# Patient Record
Sex: Female | Born: 1935 | Race: Black or African American | Hispanic: No | Smoking: Never smoker
Health system: Southern US, Community
[De-identification: ages and names within clinical notes are randomized; demographics above are authoritative.]

## PROBLEM LIST (undated history)

## (undated) DIAGNOSIS — F439 Reaction to severe stress, unspecified: Secondary | ICD-10-CM

## (undated) DIAGNOSIS — K224 Dyskinesia of esophagus: Secondary | ICD-10-CM

## (undated) DIAGNOSIS — R569 Unspecified convulsions: Secondary | ICD-10-CM

## (undated) DIAGNOSIS — K219 Gastro-esophageal reflux disease without esophagitis: Secondary | ICD-10-CM

## (undated) DIAGNOSIS — E785 Hyperlipidemia, unspecified: Secondary | ICD-10-CM

## (undated) DIAGNOSIS — E119 Type 2 diabetes mellitus without complications: Secondary | ICD-10-CM

## (undated) DIAGNOSIS — Z860100 Personal history of colon polyps, unspecified: Secondary | ICD-10-CM

## (undated) DIAGNOSIS — I272 Pulmonary hypertension, unspecified: Secondary | ICD-10-CM

## (undated) DIAGNOSIS — F419 Anxiety disorder, unspecified: Secondary | ICD-10-CM

## (undated) DIAGNOSIS — M1990 Unspecified osteoarthritis, unspecified site: Secondary | ICD-10-CM

## (undated) DIAGNOSIS — D649 Anemia, unspecified: Secondary | ICD-10-CM

## (undated) DIAGNOSIS — I639 Cerebral infarction, unspecified: Secondary | ICD-10-CM

## (undated) DIAGNOSIS — T7840XA Allergy, unspecified, initial encounter: Secondary | ICD-10-CM

## (undated) DIAGNOSIS — L0292 Furuncle, unspecified: Secondary | ICD-10-CM

## (undated) DIAGNOSIS — D3500 Benign neoplasm of unspecified adrenal gland: Secondary | ICD-10-CM

## (undated) DIAGNOSIS — M545 Low back pain, unspecified: Secondary | ICD-10-CM

## (undated) DIAGNOSIS — I10 Essential (primary) hypertension: Secondary | ICD-10-CM

## (undated) DIAGNOSIS — N1330 Unspecified hydronephrosis: Secondary | ICD-10-CM

## (undated) DIAGNOSIS — K5792 Diverticulitis of intestine, part unspecified, without perforation or abscess without bleeding: Secondary | ICD-10-CM

## (undated) DIAGNOSIS — D689 Coagulation defect, unspecified: Secondary | ICD-10-CM

## (undated) DIAGNOSIS — F32A Depression, unspecified: Secondary | ICD-10-CM

## (undated) DIAGNOSIS — H409 Unspecified glaucoma: Secondary | ICD-10-CM

## (undated) HISTORY — DX: Reaction to severe stress, unspecified: F43.9

## (undated) HISTORY — DX: Low back pain: M54.5

## (undated) HISTORY — DX: Depression, unspecified: F32.A

## (undated) HISTORY — DX: Cerebral infarction, unspecified: I63.9

## (undated) HISTORY — PX: FOOT SURGERY: SHX648

## (undated) HISTORY — PX: BREAST BIOPSY: SHX20

## (undated) HISTORY — PX: JOINT REPLACEMENT: SHX530

## (undated) HISTORY — DX: Hyperlipidemia, unspecified: E78.5

## (undated) HISTORY — DX: Unspecified hydronephrosis: N13.30

## (undated) HISTORY — DX: Type 2 diabetes mellitus without complications: E11.9

## (undated) HISTORY — DX: Diverticulitis of intestine, part unspecified, without perforation or abscess without bleeding: K57.92

## (undated) HISTORY — PX: APPENDECTOMY: SHX54

## (undated) HISTORY — DX: Unspecified glaucoma: H40.9

## (undated) HISTORY — DX: Gastro-esophageal reflux disease without esophagitis: K21.9

## (undated) HISTORY — PX: CATARACT EXTRACTION, BILATERAL: SHX1313

## (undated) HISTORY — DX: Low back pain, unspecified: M54.50

## (undated) HISTORY — DX: Dyskinesia of esophagus: K22.4

## (undated) HISTORY — DX: Unspecified osteoarthritis, unspecified site: M19.90

## (undated) HISTORY — DX: Personal history of colonic polyps: Z86.010

## (undated) HISTORY — PX: FRACTURE SURGERY: SHX138

## (undated) HISTORY — DX: Pulmonary hypertension, unspecified: I27.20

## (undated) HISTORY — DX: Benign neoplasm of unspecified adrenal gland: D35.00

## (undated) HISTORY — DX: Essential (primary) hypertension: I10

## (undated) HISTORY — DX: Anxiety disorder, unspecified: F41.9

## (undated) HISTORY — PX: VARICOSE VEIN SURGERY: SHX832

## (undated) HISTORY — PX: ABDOMINAL HYSTERECTOMY: SHX81

## (undated) HISTORY — DX: Allergy, unspecified, initial encounter: T78.40XA

## (undated) HISTORY — DX: Anemia, unspecified: D64.9

## (undated) HISTORY — DX: Furuncle, unspecified: L02.92

## (undated) HISTORY — DX: Coagulation defect, unspecified: D68.9

## (undated) HISTORY — PX: HAMMER TOE SURGERY: SHX385

## (undated) HISTORY — PX: BACK SURGERY: SHX140

## (undated) HISTORY — DX: Personal history of colon polyps, unspecified: Z86.0100

## (undated) HISTORY — DX: Unspecified convulsions: R56.9

---

## 1998-07-06 ENCOUNTER — Ambulatory Visit (HOSPITAL_COMMUNITY): Admission: RE | Admit: 1998-07-06 | Discharge: 1998-07-06 | Payer: Self-pay | Admitting: Internal Medicine

## 1998-10-17 ENCOUNTER — Other Ambulatory Visit: Admission: RE | Admit: 1998-10-17 | Discharge: 1998-10-17 | Payer: Self-pay | Admitting: *Deleted

## 1999-03-30 ENCOUNTER — Ambulatory Visit (HOSPITAL_COMMUNITY): Admission: RE | Admit: 1999-03-30 | Discharge: 1999-03-30 | Payer: Self-pay | Admitting: *Deleted

## 1999-04-03 ENCOUNTER — Ambulatory Visit (HOSPITAL_COMMUNITY): Admission: RE | Admit: 1999-04-03 | Discharge: 1999-04-03 | Payer: Self-pay | Admitting: Ophthalmology

## 1999-07-10 ENCOUNTER — Ambulatory Visit (HOSPITAL_COMMUNITY): Admission: RE | Admit: 1999-07-10 | Discharge: 1999-07-10 | Payer: Self-pay | Admitting: Internal Medicine

## 1999-07-20 ENCOUNTER — Encounter: Payer: Self-pay | Admitting: Ophthalmology

## 1999-07-24 ENCOUNTER — Ambulatory Visit (HOSPITAL_COMMUNITY): Admission: RE | Admit: 1999-07-24 | Discharge: 1999-07-25 | Payer: Self-pay | Admitting: Ophthalmology

## 1999-09-14 ENCOUNTER — Ambulatory Visit (HOSPITAL_COMMUNITY): Admission: RE | Admit: 1999-09-14 | Discharge: 1999-09-14 | Payer: Self-pay | Admitting: Ophthalmology

## 1999-10-31 ENCOUNTER — Ambulatory Visit (HOSPITAL_COMMUNITY): Admission: RE | Admit: 1999-10-31 | Discharge: 1999-10-31 | Payer: Self-pay | Admitting: Ophthalmology

## 1999-11-29 ENCOUNTER — Other Ambulatory Visit: Admission: RE | Admit: 1999-11-29 | Discharge: 1999-11-29 | Payer: Self-pay | Admitting: Internal Medicine

## 2000-01-03 ENCOUNTER — Emergency Department (HOSPITAL_COMMUNITY): Admission: EM | Admit: 2000-01-03 | Discharge: 2000-01-03 | Payer: Self-pay | Admitting: Emergency Medicine

## 2000-07-10 ENCOUNTER — Ambulatory Visit (HOSPITAL_COMMUNITY): Admission: RE | Admit: 2000-07-10 | Discharge: 2000-07-10 | Payer: Self-pay | Admitting: Internal Medicine

## 2000-07-10 ENCOUNTER — Encounter: Payer: Self-pay | Admitting: Internal Medicine

## 2000-07-24 ENCOUNTER — Ambulatory Visit (HOSPITAL_COMMUNITY): Admission: RE | Admit: 2000-07-24 | Discharge: 2000-07-24 | Payer: Self-pay | Admitting: Orthopedic Surgery

## 2000-08-20 ENCOUNTER — Ambulatory Visit (HOSPITAL_COMMUNITY): Admission: RE | Admit: 2000-08-20 | Discharge: 2000-08-20 | Payer: Self-pay | Admitting: Orthopedic Surgery

## 2000-08-20 ENCOUNTER — Encounter: Payer: Self-pay | Admitting: Orthopedic Surgery

## 2000-09-28 ENCOUNTER — Encounter: Payer: Self-pay | Admitting: Emergency Medicine

## 2000-09-28 ENCOUNTER — Emergency Department (HOSPITAL_COMMUNITY): Admission: EM | Admit: 2000-09-28 | Discharge: 2000-09-28 | Payer: Self-pay | Admitting: Emergency Medicine

## 2000-12-13 ENCOUNTER — Other Ambulatory Visit: Admission: RE | Admit: 2000-12-13 | Discharge: 2000-12-13 | Payer: Self-pay | Admitting: Obstetrics and Gynecology

## 2001-01-22 ENCOUNTER — Encounter: Payer: Self-pay | Admitting: Orthopedic Surgery

## 2001-01-22 ENCOUNTER — Ambulatory Visit (HOSPITAL_COMMUNITY): Admission: RE | Admit: 2001-01-22 | Discharge: 2001-01-22 | Payer: Self-pay | Admitting: Orthopedic Surgery

## 2001-07-15 ENCOUNTER — Encounter: Payer: Self-pay | Admitting: Internal Medicine

## 2001-07-15 ENCOUNTER — Ambulatory Visit (HOSPITAL_COMMUNITY): Admission: RE | Admit: 2001-07-15 | Discharge: 2001-07-15 | Payer: Self-pay | Admitting: Internal Medicine

## 2001-08-28 ENCOUNTER — Ambulatory Visit (HOSPITAL_COMMUNITY): Admission: RE | Admit: 2001-08-28 | Discharge: 2001-08-28 | Payer: Self-pay | Admitting: Ophthalmology

## 2001-12-24 HISTORY — PX: TOTAL KNEE ARTHROPLASTY: SHX125

## 2002-04-01 ENCOUNTER — Encounter: Payer: Self-pay | Admitting: Orthopedic Surgery

## 2002-04-07 ENCOUNTER — Inpatient Hospital Stay (HOSPITAL_COMMUNITY): Admission: RE | Admit: 2002-04-07 | Discharge: 2002-04-10 | Payer: Self-pay | Admitting: Orthopedic Surgery

## 2002-04-10 ENCOUNTER — Inpatient Hospital Stay (HOSPITAL_COMMUNITY)
Admission: RE | Admit: 2002-04-10 | Discharge: 2002-04-15 | Payer: Self-pay | Admitting: Physical Medicine & Rehabilitation

## 2002-07-16 ENCOUNTER — Ambulatory Visit (HOSPITAL_COMMUNITY): Admission: RE | Admit: 2002-07-16 | Discharge: 2002-07-16 | Payer: Self-pay | Admitting: Internal Medicine

## 2002-07-16 ENCOUNTER — Encounter: Payer: Self-pay | Admitting: Internal Medicine

## 2002-12-29 ENCOUNTER — Encounter (INDEPENDENT_AMBULATORY_CARE_PROVIDER_SITE_OTHER): Payer: Self-pay | Admitting: Gastroenterology

## 2003-07-23 ENCOUNTER — Encounter: Payer: Self-pay | Admitting: Orthopedic Surgery

## 2003-07-23 ENCOUNTER — Ambulatory Visit (HOSPITAL_COMMUNITY): Admission: RE | Admit: 2003-07-23 | Discharge: 2003-07-23 | Payer: Self-pay | Admitting: Orthopedic Surgery

## 2003-07-29 ENCOUNTER — Encounter: Payer: Self-pay | Admitting: Internal Medicine

## 2003-07-29 ENCOUNTER — Ambulatory Visit (HOSPITAL_COMMUNITY): Admission: RE | Admit: 2003-07-29 | Discharge: 2003-07-29 | Payer: Self-pay | Admitting: Internal Medicine

## 2003-10-04 ENCOUNTER — Encounter: Payer: Self-pay | Admitting: Neurosurgery

## 2003-10-06 ENCOUNTER — Inpatient Hospital Stay (HOSPITAL_COMMUNITY): Admission: RE | Admit: 2003-10-06 | Discharge: 2003-10-15 | Payer: Self-pay | Admitting: Neurosurgery

## 2003-10-06 ENCOUNTER — Encounter: Payer: Self-pay | Admitting: Neurosurgery

## 2003-10-08 ENCOUNTER — Encounter: Payer: Self-pay | Admitting: Neurosurgery

## 2003-10-15 ENCOUNTER — Inpatient Hospital Stay (HOSPITAL_COMMUNITY)
Admission: RE | Admit: 2003-10-15 | Discharge: 2003-10-22 | Payer: Self-pay | Admitting: Physical Medicine & Rehabilitation

## 2003-10-15 ENCOUNTER — Encounter: Payer: Self-pay | Admitting: Physical Medicine & Rehabilitation

## 2004-03-07 ENCOUNTER — Inpatient Hospital Stay (HOSPITAL_COMMUNITY): Admission: EM | Admit: 2004-03-07 | Discharge: 2004-03-10 | Payer: Self-pay | Admitting: Emergency Medicine

## 2004-05-18 ENCOUNTER — Emergency Department (HOSPITAL_COMMUNITY): Admission: EM | Admit: 2004-05-18 | Discharge: 2004-05-18 | Payer: Self-pay | Admitting: Emergency Medicine

## 2004-06-21 ENCOUNTER — Inpatient Hospital Stay (HOSPITAL_COMMUNITY): Admission: EM | Admit: 2004-06-21 | Discharge: 2004-06-27 | Payer: Self-pay | Admitting: Emergency Medicine

## 2004-06-22 ENCOUNTER — Encounter: Payer: Self-pay | Admitting: Internal Medicine

## 2004-09-08 ENCOUNTER — Encounter: Admission: RE | Admit: 2004-09-08 | Discharge: 2004-09-08 | Payer: Self-pay | Admitting: Internal Medicine

## 2004-11-01 ENCOUNTER — Ambulatory Visit: Payer: Self-pay | Admitting: Internal Medicine

## 2004-11-15 ENCOUNTER — Ambulatory Visit: Payer: Self-pay | Admitting: Internal Medicine

## 2004-12-13 ENCOUNTER — Ambulatory Visit: Payer: Self-pay | Admitting: Gastroenterology

## 2004-12-18 ENCOUNTER — Emergency Department (HOSPITAL_COMMUNITY): Admission: EM | Admit: 2004-12-18 | Discharge: 2004-12-19 | Payer: Self-pay | Admitting: *Deleted

## 2004-12-18 ENCOUNTER — Encounter (INDEPENDENT_AMBULATORY_CARE_PROVIDER_SITE_OTHER): Payer: Self-pay | Admitting: *Deleted

## 2004-12-24 DIAGNOSIS — D3500 Benign neoplasm of unspecified adrenal gland: Secondary | ICD-10-CM

## 2004-12-24 HISTORY — PX: COLECTOMY: SHX59

## 2004-12-24 HISTORY — DX: Benign neoplasm of unspecified adrenal gland: D35.00

## 2004-12-29 ENCOUNTER — Ambulatory Visit (HOSPITAL_COMMUNITY): Admission: RE | Admit: 2004-12-29 | Discharge: 2004-12-29 | Payer: Self-pay | Admitting: Urology

## 2004-12-29 ENCOUNTER — Ambulatory Visit (HOSPITAL_BASED_OUTPATIENT_CLINIC_OR_DEPARTMENT_OTHER): Admission: RE | Admit: 2004-12-29 | Discharge: 2004-12-29 | Payer: Self-pay | Admitting: Urology

## 2005-01-05 ENCOUNTER — Ambulatory Visit: Payer: Self-pay | Admitting: Internal Medicine

## 2005-01-18 ENCOUNTER — Ambulatory Visit (HOSPITAL_COMMUNITY): Admission: RE | Admit: 2005-01-18 | Discharge: 2005-01-18 | Payer: Self-pay | Admitting: Urology

## 2005-02-13 ENCOUNTER — Ambulatory Visit: Payer: Self-pay | Admitting: Internal Medicine

## 2005-02-19 ENCOUNTER — Ambulatory Visit: Payer: Self-pay | Admitting: Internal Medicine

## 2005-03-02 ENCOUNTER — Encounter (INDEPENDENT_AMBULATORY_CARE_PROVIDER_SITE_OTHER): Payer: Self-pay | Admitting: Gastroenterology

## 2005-03-02 ENCOUNTER — Ambulatory Visit (HOSPITAL_COMMUNITY): Admission: RE | Admit: 2005-03-02 | Discharge: 2005-03-02 | Payer: Self-pay | Admitting: Gastroenterology

## 2005-04-11 ENCOUNTER — Emergency Department (HOSPITAL_COMMUNITY): Admission: EM | Admit: 2005-04-11 | Discharge: 2005-04-11 | Payer: Self-pay | Admitting: *Deleted

## 2005-04-25 ENCOUNTER — Ambulatory Visit: Payer: Self-pay | Admitting: Internal Medicine

## 2005-05-18 ENCOUNTER — Ambulatory Visit: Payer: Self-pay | Admitting: Cardiology

## 2005-05-18 ENCOUNTER — Inpatient Hospital Stay (HOSPITAL_COMMUNITY): Admission: AD | Admit: 2005-05-18 | Discharge: 2005-05-22 | Payer: Self-pay | Admitting: Gastroenterology

## 2005-05-18 ENCOUNTER — Encounter (INDEPENDENT_AMBULATORY_CARE_PROVIDER_SITE_OTHER): Payer: Self-pay | Admitting: *Deleted

## 2005-05-19 ENCOUNTER — Ambulatory Visit: Payer: Self-pay | Admitting: Gastroenterology

## 2005-05-22 ENCOUNTER — Encounter (INDEPENDENT_AMBULATORY_CARE_PROVIDER_SITE_OTHER): Payer: Self-pay | Admitting: *Deleted

## 2005-05-28 ENCOUNTER — Ambulatory Visit: Payer: Self-pay | Admitting: Gastroenterology

## 2005-06-05 ENCOUNTER — Ambulatory Visit: Payer: Self-pay | Admitting: Internal Medicine

## 2005-06-06 ENCOUNTER — Encounter (INDEPENDENT_AMBULATORY_CARE_PROVIDER_SITE_OTHER): Payer: Self-pay | Admitting: *Deleted

## 2005-06-06 ENCOUNTER — Inpatient Hospital Stay (HOSPITAL_COMMUNITY): Admission: AD | Admit: 2005-06-06 | Discharge: 2005-06-19 | Payer: Self-pay | Admitting: Internal Medicine

## 2005-06-07 ENCOUNTER — Ambulatory Visit: Payer: Self-pay | Admitting: Internal Medicine

## 2005-06-11 ENCOUNTER — Encounter (INDEPENDENT_AMBULATORY_CARE_PROVIDER_SITE_OTHER): Payer: Self-pay | Admitting: *Deleted

## 2005-06-12 ENCOUNTER — Encounter (INDEPENDENT_AMBULATORY_CARE_PROVIDER_SITE_OTHER): Payer: Self-pay | Admitting: *Deleted

## 2005-06-28 ENCOUNTER — Ambulatory Visit: Payer: Self-pay | Admitting: Internal Medicine

## 2005-07-24 ENCOUNTER — Ambulatory Visit: Payer: Self-pay | Admitting: Internal Medicine

## 2005-08-02 ENCOUNTER — Ambulatory Visit: Payer: Self-pay | Admitting: Internal Medicine

## 2005-08-15 ENCOUNTER — Encounter (INDEPENDENT_AMBULATORY_CARE_PROVIDER_SITE_OTHER): Payer: Self-pay | Admitting: *Deleted

## 2005-09-10 ENCOUNTER — Ambulatory Visit (HOSPITAL_COMMUNITY): Admission: RE | Admit: 2005-09-10 | Discharge: 2005-09-10 | Payer: Self-pay | Admitting: Internal Medicine

## 2005-09-17 ENCOUNTER — Ambulatory Visit: Payer: Self-pay | Admitting: Internal Medicine

## 2005-09-24 ENCOUNTER — Ambulatory Visit: Payer: Self-pay | Admitting: Internal Medicine

## 2005-11-23 ENCOUNTER — Ambulatory Visit: Payer: Self-pay | Admitting: Internal Medicine

## 2005-11-28 ENCOUNTER — Ambulatory Visit: Payer: Self-pay | Admitting: Internal Medicine

## 2005-12-27 ENCOUNTER — Ambulatory Visit: Payer: Self-pay | Admitting: Internal Medicine

## 2006-02-18 ENCOUNTER — Ambulatory Visit: Payer: Self-pay | Admitting: Internal Medicine

## 2006-02-27 ENCOUNTER — Ambulatory Visit: Payer: Self-pay | Admitting: Internal Medicine

## 2006-05-01 ENCOUNTER — Encounter: Admission: RE | Admit: 2006-05-01 | Discharge: 2006-05-01 | Payer: Self-pay | Admitting: Orthopedic Surgery

## 2006-05-27 ENCOUNTER — Ambulatory Visit: Payer: Self-pay | Admitting: Internal Medicine

## 2006-05-30 ENCOUNTER — Ambulatory Visit: Payer: Self-pay | Admitting: Internal Medicine

## 2006-06-28 ENCOUNTER — Ambulatory Visit: Payer: Self-pay | Admitting: Internal Medicine

## 2006-07-08 ENCOUNTER — Encounter: Admission: RE | Admit: 2006-07-08 | Discharge: 2006-07-08 | Payer: Self-pay | Admitting: Orthopedic Surgery

## 2006-07-10 ENCOUNTER — Encounter: Admission: RE | Admit: 2006-07-10 | Discharge: 2006-09-18 | Payer: Self-pay | Admitting: Orthopedic Surgery

## 2006-09-02 ENCOUNTER — Ambulatory Visit: Payer: Self-pay | Admitting: Internal Medicine

## 2006-09-04 ENCOUNTER — Ambulatory Visit: Payer: Self-pay | Admitting: Internal Medicine

## 2006-09-09 ENCOUNTER — Ambulatory Visit (HOSPITAL_COMMUNITY): Admission: RE | Admit: 2006-09-09 | Discharge: 2006-09-09 | Payer: Self-pay | Admitting: Internal Medicine

## 2006-11-21 ENCOUNTER — Ambulatory Visit: Payer: Self-pay | Admitting: Internal Medicine

## 2006-11-21 LAB — CONVERTED CEMR LAB
ALT: 16 units/L (ref 0–40)
AST: 20 units/L (ref 0–37)
Alkaline Phosphatase: 119 units/L — ABNORMAL HIGH (ref 39–117)
Chol/HDL Ratio, serum: 3.5
Cholesterol: 160 mg/dL (ref 0–200)
Creatinine, Ser: 1.1 mg/dL (ref 0.4–1.2)
Glucose, Bld: 98 mg/dL (ref 70–99)
HDL: 45.2 mg/dL (ref >39.0)
Hgb A1c MFr Bld: 6.6 % — ABNORMAL HIGH (ref 4.6–6.0)
LDL Cholesterol: 104 mg/dL — ABNORMAL HIGH (ref 0–99)
Potassium: 4.4 meq/L (ref 3.5–5.1)
Sodium: 143 meq/L (ref 135–145)
Total Bilirubin: 0.5 mg/dL (ref 0.3–1.2)
Triglyceride fasting, serum: 55 mg/dL (ref 0–149)
Uric Acid, Serum: 3.1 mg/dL (ref 2.4–7.0)
VLDL: 11 mg/dL (ref 0–40)

## 2006-11-22 ENCOUNTER — Encounter: Admission: RE | Admit: 2006-11-22 | Discharge: 2006-11-22 | Payer: Self-pay | Admitting: Internal Medicine

## 2006-11-22 ENCOUNTER — Ambulatory Visit: Payer: Self-pay | Admitting: Internal Medicine

## 2006-11-29 ENCOUNTER — Encounter: Admission: RE | Admit: 2006-11-29 | Discharge: 2006-11-29 | Payer: Self-pay | Admitting: Orthopedic Surgery

## 2006-12-20 ENCOUNTER — Ambulatory Visit: Payer: Self-pay | Admitting: Internal Medicine

## 2006-12-24 HISTORY — PX: ROTATOR CUFF REPAIR: SHX139

## 2007-01-06 ENCOUNTER — Ambulatory Visit: Payer: Self-pay | Admitting: Gastroenterology

## 2007-01-06 LAB — CONVERTED CEMR LAB
ALT: 17 units/L (ref 0–40)
AST: 22 units/L (ref 0–37)
Albumin: 3.4 g/dL — ABNORMAL LOW (ref 3.5–5.2)
Alkaline Phosphatase: 135 units/L — ABNORMAL HIGH (ref 39–117)
BUN: 19 mg/dL (ref 6–23)
CO2: 33 meq/L — ABNORMAL HIGH (ref 19–32)
Calcium: 9.4 mg/dL (ref 8.4–10.5)
Chloride: 108 meq/L (ref 96–112)
Creatinine, Ser: 1 mg/dL (ref 0.4–1.2)
GFR calc non Af Amer: 58 mL/min
Glomerular Filtration Rate, Af Am: 70 mL/min/{1.73_m2}
Glucose, Bld: 87 mg/dL (ref 70–99)
HCT: 37.1 % (ref 36.0–46.0)
Hemoglobin: 11.8 g/dL — ABNORMAL LOW (ref 12.0–15.0)
MCHC: 31.8 g/dL (ref 30.0–36.0)
MCV: 90.5 fL (ref 78.0–100.0)
Platelets: 180 10*3/uL (ref 150–400)
Potassium: 4.1 meq/L (ref 3.5–5.1)
RBC: 4.1 M/uL (ref 3.87–5.11)
RDW: 16.9 % — ABNORMAL HIGH (ref 11.5–14.6)
Sodium: 145 meq/L (ref 135–145)
Total Bilirubin: 0.6 mg/dL (ref 0.3–1.2)
Total Protein: 7.3 g/dL (ref 6.0–8.3)
WBC: 5.2 10*3/uL (ref 4.5–10.5)

## 2007-01-07 ENCOUNTER — Ambulatory Visit: Payer: Self-pay | Admitting: Cardiology

## 2007-01-07 ENCOUNTER — Encounter: Payer: Self-pay | Admitting: Gastroenterology

## 2007-01-13 ENCOUNTER — Ambulatory Visit (HOSPITAL_COMMUNITY): Admission: RE | Admit: 2007-01-13 | Discharge: 2007-01-13 | Payer: Self-pay | Admitting: Gastroenterology

## 2007-01-13 ENCOUNTER — Encounter: Payer: Self-pay | Admitting: Gastroenterology

## 2007-01-15 ENCOUNTER — Ambulatory Visit: Payer: Self-pay | Admitting: Gastroenterology

## 2007-02-17 ENCOUNTER — Ambulatory Visit: Payer: Self-pay | Admitting: Internal Medicine

## 2007-04-17 ENCOUNTER — Ambulatory Visit: Payer: Self-pay | Admitting: Internal Medicine

## 2007-04-17 LAB — CONVERTED CEMR LAB
ALT: 16 units/L (ref 0–40)
AST: 18 units/L (ref 0–37)
Albumin: 3.3 g/dL — ABNORMAL LOW (ref 3.5–5.2)
Alkaline Phosphatase: 114 units/L (ref 39–117)
BUN: 25 mg/dL — ABNORMAL HIGH (ref 6–23)
Basophils Absolute: 0 10*3/uL (ref 0.0–0.1)
Basophils Relative: 0.4 % (ref 0.0–1.0)
Bilirubin, Direct: 0.1 mg/dL (ref 0.0–0.3)
CO2: 33 meq/L — ABNORMAL HIGH (ref 19–32)
Calcium: 9 mg/dL (ref 8.4–10.5)
Chloride: 107 meq/L (ref 96–112)
Cholesterol: 191 mg/dL (ref 0–200)
Creatinine, Ser: 1.1 mg/dL (ref 0.4–1.2)
Eosinophils Absolute: 0.2 10*3/uL (ref 0.0–0.6)
Eosinophils Relative: 4.3 % (ref 0.0–5.0)
GFR calc Af Amer: 63 mL/min
GFR calc non Af Amer: 52 mL/min
Glucose, Bld: 104 mg/dL — ABNORMAL HIGH (ref 70–99)
HCT: 34.8 % — ABNORMAL LOW (ref 36.0–46.0)
HDL: 53.5 mg/dL (ref >39.0)
Hemoglobin: 11.5 g/dL — ABNORMAL LOW (ref 12.0–15.0)
Hgb A1c MFr Bld: 6.3 % — ABNORMAL HIGH (ref 4.6–6.0)
LDL Cholesterol: 125 mg/dL — ABNORMAL HIGH (ref 0–99)
Lymphocytes Relative: 27.3 % (ref 12.0–46.0)
MCHC: 33.1 g/dL (ref 30.0–36.0)
MCV: 89.7 fL (ref 78.0–100.0)
Monocytes Absolute: 0.4 10*3/uL (ref 0.2–0.7)
Monocytes Relative: 9.6 % (ref 3.0–11.0)
Neutro Abs: 2.5 10*3/uL (ref 1.4–7.7)
Neutrophils Relative %: 58.4 % (ref 43.0–77.0)
Platelets: 154 10*3/uL (ref 150–400)
Potassium: 4.6 meq/L (ref 3.5–5.1)
RBC: 3.88 M/uL (ref 3.87–5.11)
RDW: 16.3 % — ABNORMAL HIGH (ref 11.5–14.6)
Sodium: 145 meq/L (ref 135–145)
TSH: 0.83 microintl units/mL (ref 0.35–5.50)
Total Bilirubin: 0.6 mg/dL (ref 0.3–1.2)
Total CHOL/HDL Ratio: 3.6
Total Protein: 6.7 g/dL (ref 6.0–8.3)
Triglycerides: 62 mg/dL (ref 0–149)
VLDL: 12 mg/dL (ref 0–40)
WBC: 4.2 10*3/uL — ABNORMAL LOW (ref 4.5–10.5)

## 2007-04-22 ENCOUNTER — Ambulatory Visit: Payer: Self-pay | Admitting: Internal Medicine

## 2007-05-28 ENCOUNTER — Encounter: Admission: RE | Admit: 2007-05-28 | Discharge: 2007-05-28 | Payer: Self-pay | Admitting: Orthopedic Surgery

## 2007-05-28 ENCOUNTER — Ambulatory Visit: Payer: Self-pay | Admitting: Gastroenterology

## 2007-06-26 ENCOUNTER — Ambulatory Visit (HOSPITAL_COMMUNITY): Admission: RE | Admit: 2007-06-26 | Discharge: 2007-07-01 | Payer: Self-pay | Admitting: Orthopedic Surgery

## 2007-07-24 ENCOUNTER — Encounter: Admission: RE | Admit: 2007-07-24 | Discharge: 2007-10-22 | Payer: Self-pay | Admitting: Orthopedic Surgery

## 2007-07-28 ENCOUNTER — Ambulatory Visit: Payer: Self-pay | Admitting: Internal Medicine

## 2007-07-28 LAB — CONVERTED CEMR LAB
BUN: 18 mg/dL (ref 6–23)
CO2: 33 meq/L — ABNORMAL HIGH (ref 19–32)
Calcium: 9.1 mg/dL (ref 8.4–10.5)
Chloride: 107 meq/L (ref 96–112)
Cholesterol: 157 mg/dL (ref 0–200)
Creatinine, Ser: 0.8 mg/dL (ref 0.4–1.2)
GFR calc Af Amer: 90 mL/min
GFR calc non Af Amer: 75 mL/min
Glucose, Bld: 108 mg/dL — ABNORMAL HIGH (ref 70–99)
HDL: 45.8 mg/dL (ref >39.0)
Hgb A1c MFr Bld: 6.3 % — ABNORMAL HIGH (ref 4.6–6.0)
LDL Cholesterol: 97 mg/dL (ref 0–99)
Potassium: 4.7 meq/L (ref 3.5–5.1)
Sodium: 145 meq/L (ref 135–145)
Total CHOL/HDL Ratio: 3.4
Triglycerides: 70 mg/dL (ref 0–149)
VLDL: 14 mg/dL (ref 0–40)
Vit D, 1,25-Dihydroxy: 25 (ref 20–57)
Vitamin B-12: 462 pg/mL (ref 211–911)

## 2007-07-29 ENCOUNTER — Ambulatory Visit: Payer: Self-pay | Admitting: Internal Medicine

## 2007-07-29 DIAGNOSIS — E1129 Type 2 diabetes mellitus with other diabetic kidney complication: Secondary | ICD-10-CM | POA: Insufficient documentation

## 2007-07-29 DIAGNOSIS — Z8719 Personal history of other diseases of the digestive system: Secondary | ICD-10-CM | POA: Insufficient documentation

## 2007-07-29 DIAGNOSIS — M1990 Unspecified osteoarthritis, unspecified site: Secondary | ICD-10-CM | POA: Insufficient documentation

## 2007-07-29 DIAGNOSIS — M109 Gout, unspecified: Secondary | ICD-10-CM | POA: Insufficient documentation

## 2007-07-29 DIAGNOSIS — I10 Essential (primary) hypertension: Secondary | ICD-10-CM | POA: Insufficient documentation

## 2007-07-29 DIAGNOSIS — Z860100 Personal history of colon polyps, unspecified: Secondary | ICD-10-CM | POA: Insufficient documentation

## 2007-07-29 DIAGNOSIS — E119 Type 2 diabetes mellitus without complications: Secondary | ICD-10-CM | POA: Insufficient documentation

## 2007-07-29 DIAGNOSIS — N1330 Unspecified hydronephrosis: Secondary | ICD-10-CM | POA: Insufficient documentation

## 2007-09-11 ENCOUNTER — Ambulatory Visit (HOSPITAL_COMMUNITY): Admission: RE | Admit: 2007-09-11 | Discharge: 2007-09-11 | Payer: Self-pay | Admitting: Internal Medicine

## 2007-10-10 ENCOUNTER — Telehealth: Payer: Self-pay | Admitting: Internal Medicine

## 2007-11-03 ENCOUNTER — Ambulatory Visit: Payer: Self-pay | Admitting: Internal Medicine

## 2007-11-03 LAB — CONVERTED CEMR LAB
BUN: 22 mg/dL (ref 6–23)
CO2: 32 meq/L (ref 19–32)
Calcium: 9.1 mg/dL (ref 8.4–10.5)
Chloride: 106 meq/L (ref 96–112)
Creatinine, Ser: 0.9 mg/dL (ref 0.4–1.2)
GFR calc Af Amer: 79 mL/min
GFR calc non Af Amer: 65 mL/min
Glucose, Bld: 103 mg/dL — ABNORMAL HIGH (ref 70–99)
Hgb A1c MFr Bld: 6.3 % — ABNORMAL HIGH (ref 4.6–6.0)
Potassium: 3.9 meq/L (ref 3.5–5.1)
Sodium: 143 meq/L (ref 135–145)

## 2007-11-06 ENCOUNTER — Ambulatory Visit: Payer: Self-pay | Admitting: Internal Medicine

## 2007-11-06 DIAGNOSIS — E785 Hyperlipidemia, unspecified: Secondary | ICD-10-CM | POA: Insufficient documentation

## 2007-12-15 ENCOUNTER — Encounter: Payer: Self-pay | Admitting: Internal Medicine

## 2007-12-25 DIAGNOSIS — L0292 Furuncle, unspecified: Secondary | ICD-10-CM

## 2007-12-25 HISTORY — DX: Furuncle, unspecified: L02.92

## 2008-01-14 ENCOUNTER — Telehealth: Payer: Self-pay | Admitting: Internal Medicine

## 2008-02-05 ENCOUNTER — Ambulatory Visit: Payer: Self-pay | Admitting: Internal Medicine

## 2008-02-05 LAB — CONVERTED CEMR LAB
BUN: 24 mg/dL — ABNORMAL HIGH (ref 6–23)
CO2: 32 meq/L (ref 19–32)
Calcium: 8.9 mg/dL (ref 8.4–10.5)
Chloride: 103 meq/L (ref 96–112)
Creatinine, Ser: 0.9 mg/dL (ref 0.4–1.2)
GFR calc Af Amer: 79 mL/min
GFR calc non Af Amer: 65 mL/min
Glucose, Bld: 107 mg/dL — ABNORMAL HIGH (ref 70–99)
Hgb A1c MFr Bld: 6.4 % — ABNORMAL HIGH (ref 4.6–6.0)
Potassium: 4.6 meq/L (ref 3.5–5.1)
Sodium: 141 meq/L (ref 135–145)
TSH: 1.2 microintl units/mL (ref 0.35–5.50)

## 2008-02-06 ENCOUNTER — Ambulatory Visit: Payer: Self-pay | Admitting: Internal Medicine

## 2008-03-05 ENCOUNTER — Encounter: Payer: Self-pay | Admitting: Internal Medicine

## 2008-05-04 ENCOUNTER — Ambulatory Visit: Payer: Self-pay | Admitting: Internal Medicine

## 2008-05-04 LAB — CONVERTED CEMR LAB
BUN: 21 mg/dL (ref 6–23)
CO2: 32 meq/L (ref 19–32)
Calcium: 9 mg/dL (ref 8.4–10.5)
Chloride: 110 meq/L (ref 96–112)
Creatinine, Ser: 0.9 mg/dL (ref 0.4–1.2)
GFR calc Af Amer: 79 mL/min
GFR calc non Af Amer: 65 mL/min
Glucose, Bld: 109 mg/dL — ABNORMAL HIGH (ref 70–99)
Hgb A1c MFr Bld: 6.3 % — ABNORMAL HIGH (ref 4.6–6.0)
Potassium: 4.5 meq/L (ref 3.5–5.1)
Sodium: 144 meq/L (ref 135–145)
TSH: 1.03 microintl units/mL (ref 0.35–5.50)

## 2008-05-06 ENCOUNTER — Ambulatory Visit: Payer: Self-pay | Admitting: Internal Medicine

## 2008-05-06 LAB — CONVERTED CEMR LAB

## 2008-05-19 ENCOUNTER — Telehealth: Payer: Self-pay | Admitting: Internal Medicine

## 2008-05-28 ENCOUNTER — Encounter: Payer: Self-pay | Admitting: Internal Medicine

## 2008-05-31 ENCOUNTER — Ambulatory Visit: Payer: Self-pay | Admitting: Internal Medicine

## 2008-07-07 ENCOUNTER — Telehealth: Payer: Self-pay | Admitting: Internal Medicine

## 2008-07-29 ENCOUNTER — Ambulatory Visit: Payer: Self-pay | Admitting: Internal Medicine

## 2008-07-29 LAB — CONVERTED CEMR LAB
BUN: 16 mg/dL (ref 6–23)
CO2: 31 meq/L (ref 19–32)
Calcium: 8.9 mg/dL (ref 8.4–10.5)
Chloride: 108 meq/L (ref 96–112)
Creatinine, Ser: 0.7 mg/dL (ref 0.4–1.2)
GFR calc Af Amer: 105 mL/min
GFR calc non Af Amer: 87 mL/min
Glucose, Bld: 105 mg/dL — ABNORMAL HIGH (ref 70–99)
Hgb A1c MFr Bld: 6.6 % — ABNORMAL HIGH (ref 4.6–6.0)
Potassium: 4.2 meq/L (ref 3.5–5.1)
Sodium: 143 meq/L (ref 135–145)

## 2008-08-04 ENCOUNTER — Ambulatory Visit: Payer: Self-pay | Admitting: Internal Medicine

## 2008-09-11 ENCOUNTER — Encounter: Payer: Self-pay | Admitting: Internal Medicine

## 2008-09-20 ENCOUNTER — Ambulatory Visit (HOSPITAL_COMMUNITY): Admission: RE | Admit: 2008-09-20 | Discharge: 2008-09-20 | Payer: Self-pay | Admitting: Internal Medicine

## 2008-09-23 ENCOUNTER — Encounter: Payer: Self-pay | Admitting: Internal Medicine

## 2008-10-27 ENCOUNTER — Encounter: Payer: Self-pay | Admitting: Internal Medicine

## 2008-11-03 ENCOUNTER — Telehealth: Payer: Self-pay | Admitting: Internal Medicine

## 2008-11-08 ENCOUNTER — Ambulatory Visit: Payer: Self-pay | Admitting: Internal Medicine

## 2008-11-09 LAB — CONVERTED CEMR LAB
BUN: 15 mg/dL (ref 6–23)
CO2: 29 meq/L (ref 19–32)
Calcium: 9.5 mg/dL (ref 8.4–10.5)
Chloride: 107 meq/L (ref 96–112)
Creatinine, Ser: 0.8 mg/dL (ref 0.4–1.2)
GFR calc Af Amer: 90 mL/min
GFR calc non Af Amer: 74 mL/min
Glucose, Bld: 116 mg/dL — ABNORMAL HIGH (ref 70–99)
Hgb A1c MFr Bld: 6.5 % — ABNORMAL HIGH (ref 4.6–6.0)
Potassium: 3.8 meq/L (ref 3.5–5.1)
Sodium: 144 meq/L (ref 135–145)

## 2008-11-16 ENCOUNTER — Ambulatory Visit: Payer: Self-pay | Admitting: Internal Medicine

## 2008-12-03 ENCOUNTER — Telehealth: Payer: Self-pay | Admitting: Internal Medicine

## 2008-12-24 DIAGNOSIS — I639 Cerebral infarction, unspecified: Secondary | ICD-10-CM

## 2008-12-24 HISTORY — DX: Cerebral infarction, unspecified: I63.9

## 2009-01-06 ENCOUNTER — Ambulatory Visit: Payer: Self-pay | Admitting: Internal Medicine

## 2009-01-10 ENCOUNTER — Telehealth: Payer: Self-pay | Admitting: Internal Medicine

## 2009-01-11 ENCOUNTER — Encounter: Payer: Self-pay | Admitting: Internal Medicine

## 2009-02-07 ENCOUNTER — Ambulatory Visit: Payer: Self-pay | Admitting: Internal Medicine

## 2009-02-08 LAB — CONVERTED CEMR LAB
BUN: 16 mg/dL (ref 6–23)
CO2: 31 meq/L (ref 19–32)
Calcium: 9.4 mg/dL (ref 8.4–10.5)
Chloride: 104 meq/L (ref 96–112)
Creatinine, Ser: 0.8 mg/dL (ref 0.4–1.2)
GFR calc Af Amer: 90 mL/min
GFR calc non Af Amer: 74 mL/min
Glucose, Bld: 128 mg/dL — ABNORMAL HIGH (ref 70–99)
Hgb A1c MFr Bld: 6.7 % — ABNORMAL HIGH (ref 4.6–6.0)
Potassium: 3.8 meq/L (ref 3.5–5.1)
Sodium: 142 meq/L (ref 135–145)

## 2009-02-16 ENCOUNTER — Ambulatory Visit: Payer: Self-pay | Admitting: Internal Medicine

## 2009-02-17 ENCOUNTER — Telehealth: Payer: Self-pay | Admitting: Internal Medicine

## 2009-02-28 ENCOUNTER — Encounter: Payer: Self-pay | Admitting: Internal Medicine

## 2009-04-18 ENCOUNTER — Telehealth: Payer: Self-pay | Admitting: Internal Medicine

## 2009-05-05 ENCOUNTER — Ambulatory Visit: Payer: Self-pay | Admitting: Internal Medicine

## 2009-05-05 LAB — CONVERTED CEMR LAB
BUN: 13 mg/dL (ref 6–23)
CO2: 31 meq/L (ref 19–32)
Calcium: 9.1 mg/dL (ref 8.4–10.5)
Chloride: 103 meq/L (ref 96–112)
Creatinine, Ser: 0.9 mg/dL (ref 0.4–1.2)
GFR calc non Af Amer: 78.35 mL/min (ref >60)
Glucose, Bld: 116 mg/dL — ABNORMAL HIGH (ref 70–99)
Hgb A1c MFr Bld: 6.9 % — ABNORMAL HIGH (ref 4.6–6.5)
Potassium: 3.9 meq/L (ref 3.5–5.1)
Sodium: 143 meq/L (ref 135–145)

## 2009-05-16 ENCOUNTER — Telehealth (INDEPENDENT_AMBULATORY_CARE_PROVIDER_SITE_OTHER): Payer: Self-pay | Admitting: *Deleted

## 2009-05-16 ENCOUNTER — Ambulatory Visit: Payer: Self-pay | Admitting: Internal Medicine

## 2009-05-16 DIAGNOSIS — R1310 Dysphagia, unspecified: Secondary | ICD-10-CM | POA: Insufficient documentation

## 2009-05-17 ENCOUNTER — Encounter (INDEPENDENT_AMBULATORY_CARE_PROVIDER_SITE_OTHER): Payer: Self-pay | Admitting: *Deleted

## 2009-05-19 ENCOUNTER — Encounter: Payer: Self-pay | Admitting: Internal Medicine

## 2009-06-14 DIAGNOSIS — Z862 Personal history of diseases of the blood and blood-forming organs and certain disorders involving the immune mechanism: Secondary | ICD-10-CM | POA: Insufficient documentation

## 2009-06-17 ENCOUNTER — Ambulatory Visit: Payer: Self-pay | Admitting: Internal Medicine

## 2009-06-28 ENCOUNTER — Encounter: Payer: Self-pay | Admitting: Internal Medicine

## 2009-07-11 ENCOUNTER — Ambulatory Visit: Payer: Self-pay | Admitting: Internal Medicine

## 2009-07-11 ENCOUNTER — Encounter: Payer: Self-pay | Admitting: Internal Medicine

## 2009-07-13 ENCOUNTER — Telehealth: Payer: Self-pay | Admitting: Internal Medicine

## 2009-07-13 ENCOUNTER — Encounter: Payer: Self-pay | Admitting: Internal Medicine

## 2009-07-15 ENCOUNTER — Ambulatory Visit (HOSPITAL_COMMUNITY): Admission: RE | Admit: 2009-07-15 | Discharge: 2009-07-15 | Payer: Self-pay | Admitting: Internal Medicine

## 2009-07-18 ENCOUNTER — Encounter: Payer: Self-pay | Admitting: Internal Medicine

## 2009-07-19 ENCOUNTER — Telehealth: Payer: Self-pay | Admitting: Internal Medicine

## 2009-07-26 ENCOUNTER — Encounter: Payer: Self-pay | Admitting: Internal Medicine

## 2009-08-29 ENCOUNTER — Emergency Department (HOSPITAL_COMMUNITY): Admission: EM | Admit: 2009-08-29 | Discharge: 2009-08-30 | Payer: Self-pay | Admitting: Emergency Medicine

## 2009-08-30 ENCOUNTER — Telehealth: Payer: Self-pay | Admitting: Internal Medicine

## 2009-09-05 ENCOUNTER — Ambulatory Visit: Payer: Self-pay | Admitting: Internal Medicine

## 2009-09-05 LAB — CONVERTED CEMR LAB
ALT: 12 units/L (ref 0–35)
AST: 20 units/L (ref 0–37)
Albumin: 3.4 g/dL — ABNORMAL LOW (ref 3.5–5.2)
Alkaline Phosphatase: 104 units/L (ref 39–117)
BUN: 17 mg/dL (ref 6–23)
Bilirubin, Direct: 0 mg/dL (ref 0.0–0.3)
CO2: 30 meq/L (ref 19–32)
Calcium: 8.9 mg/dL (ref 8.4–10.5)
Chloride: 105 meq/L (ref 96–112)
Creatinine, Ser: 0.8 mg/dL (ref 0.4–1.2)
GFR calc non Af Amer: 89.68 mL/min (ref >60)
Glucose, Bld: 130 mg/dL — ABNORMAL HIGH (ref 70–99)
Hgb A1c MFr Bld: 6.7 % — ABNORMAL HIGH (ref 4.6–6.5)
Potassium: 3.8 meq/L (ref 3.5–5.1)
Sodium: 144 meq/L (ref 135–145)
Total Bilirubin: 0.6 mg/dL (ref 0.3–1.2)
Total Protein: 6.6 g/dL (ref 6.0–8.3)

## 2009-09-07 ENCOUNTER — Ambulatory Visit: Payer: Self-pay | Admitting: Internal Medicine

## 2009-09-07 DIAGNOSIS — M545 Low back pain, unspecified: Secondary | ICD-10-CM | POA: Insufficient documentation

## 2009-09-21 ENCOUNTER — Ambulatory Visit (HOSPITAL_COMMUNITY): Admission: RE | Admit: 2009-09-21 | Discharge: 2009-09-21 | Payer: Self-pay | Admitting: Internal Medicine

## 2009-10-07 ENCOUNTER — Ambulatory Visit: Payer: Self-pay | Admitting: Internal Medicine

## 2009-11-19 ENCOUNTER — Emergency Department (HOSPITAL_COMMUNITY): Admission: EM | Admit: 2009-11-19 | Discharge: 2009-11-20 | Payer: Self-pay | Admitting: Internal Medicine

## 2009-11-25 ENCOUNTER — Ambulatory Visit: Payer: Self-pay | Admitting: Internal Medicine

## 2009-12-24 DIAGNOSIS — K224 Dyskinesia of esophagus: Secondary | ICD-10-CM

## 2009-12-24 HISTORY — DX: Dyskinesia of esophagus: K22.4

## 2009-12-30 ENCOUNTER — Encounter: Payer: Self-pay | Admitting: Internal Medicine

## 2010-01-02 ENCOUNTER — Encounter: Payer: Self-pay | Admitting: Internal Medicine

## 2010-01-02 ENCOUNTER — Telehealth: Payer: Self-pay | Admitting: Internal Medicine

## 2010-01-13 ENCOUNTER — Encounter: Payer: Self-pay | Admitting: Internal Medicine

## 2010-03-09 ENCOUNTER — Encounter: Payer: Self-pay | Admitting: Internal Medicine

## 2010-03-15 ENCOUNTER — Telehealth: Payer: Self-pay | Admitting: Internal Medicine

## 2010-03-26 ENCOUNTER — Emergency Department (HOSPITAL_COMMUNITY): Admission: EM | Admit: 2010-03-26 | Discharge: 2010-03-26 | Payer: Self-pay | Admitting: Emergency Medicine

## 2010-03-26 ENCOUNTER — Encounter (INDEPENDENT_AMBULATORY_CARE_PROVIDER_SITE_OTHER): Payer: Self-pay | Admitting: *Deleted

## 2010-03-27 ENCOUNTER — Telehealth: Payer: Self-pay | Admitting: Internal Medicine

## 2010-03-27 ENCOUNTER — Ambulatory Visit: Payer: Self-pay | Admitting: Gastroenterology

## 2010-03-27 DIAGNOSIS — K219 Gastro-esophageal reflux disease without esophagitis: Secondary | ICD-10-CM | POA: Insufficient documentation

## 2010-03-27 DIAGNOSIS — K5730 Diverticulosis of large intestine without perforation or abscess without bleeding: Secondary | ICD-10-CM | POA: Insufficient documentation

## 2010-03-27 DIAGNOSIS — Z8673 Personal history of transient ischemic attack (TIA), and cerebral infarction without residual deficits: Secondary | ICD-10-CM | POA: Insufficient documentation

## 2010-03-27 DIAGNOSIS — I6350 Cerebral infarction due to unspecified occlusion or stenosis of unspecified cerebral artery: Secondary | ICD-10-CM | POA: Insufficient documentation

## 2010-03-28 ENCOUNTER — Ambulatory Visit (HOSPITAL_COMMUNITY): Admission: RE | Admit: 2010-03-28 | Discharge: 2010-03-28 | Payer: Self-pay | Admitting: Gastroenterology

## 2010-03-28 LAB — CONVERTED CEMR LAB
ALT: 14 units/L (ref 0–35)
AST: 21 units/L (ref 0–37)
Albumin: 3.9 g/dL (ref 3.5–5.2)
Alkaline Phosphatase: 114 units/L (ref 39–117)
Basophils Absolute: 0 10*3/uL (ref 0.0–0.1)
Basophils Relative: 0.3 % (ref 0.0–3.0)
Bilirubin, Direct: 0.1 mg/dL (ref 0.0–0.3)
Eosinophils Absolute: 0.3 10*3/uL (ref 0.0–0.7)
Eosinophils Relative: 4.1 % (ref 0.0–5.0)
HCT: 34.8 % — ABNORMAL LOW (ref 36.0–46.0)
Hemoglobin: 11.7 g/dL — ABNORMAL LOW (ref 12.0–15.0)
Lymphocytes Relative: 23.8 % (ref 12.0–46.0)
Lymphs Abs: 1.5 10*3/uL (ref 0.7–4.0)
MCHC: 33.6 g/dL (ref 30.0–36.0)
MCV: 88 fL (ref 78.0–100.0)
Monocytes Absolute: 0.5 10*3/uL (ref 0.1–1.0)
Monocytes Relative: 8.1 % (ref 3.0–12.0)
Neutro Abs: 4.1 10*3/uL (ref 1.4–7.7)
Neutrophils Relative %: 63.7 % (ref 43.0–77.0)
Platelets: 191 10*3/uL (ref 150.0–400.0)
RBC: 3.96 M/uL (ref 3.87–5.11)
RDW: 18 % — ABNORMAL HIGH (ref 11.5–14.6)
Total Bilirubin: 0.6 mg/dL (ref 0.3–1.2)
Total Protein: 7.4 g/dL (ref 6.0–8.3)
WBC: 6.5 10*3/uL (ref 4.5–10.5)

## 2010-04-06 ENCOUNTER — Ambulatory Visit: Payer: Self-pay | Admitting: Internal Medicine

## 2010-04-06 LAB — CONVERTED CEMR LAB
ALT: 13 units/L (ref 0–35)
AST: 18 units/L (ref 0–37)
Albumin: 3.7 g/dL (ref 3.5–5.2)
Alkaline Phosphatase: 101 units/L (ref 39–117)
BUN: 34 mg/dL — ABNORMAL HIGH (ref 6–23)
Bilirubin, Direct: 0.1 mg/dL (ref 0.0–0.3)
CO2: 30 meq/L (ref 19–32)
Calcium: 9.5 mg/dL (ref 8.4–10.5)
Chloride: 102 meq/L (ref 96–112)
Cholesterol: 157 mg/dL (ref 0–200)
Creatinine, Ser: 1.3 mg/dL — ABNORMAL HIGH (ref 0.4–1.2)
GFR calc non Af Amer: 51.13 mL/min (ref >60)
Glucose, Bld: 122 mg/dL — ABNORMAL HIGH (ref 70–99)
HDL: 52.1 mg/dL (ref >39.00)
Hgb A1c MFr Bld: 7.4 % — ABNORMAL HIGH (ref 4.6–6.5)
LDL Cholesterol: 90 mg/dL (ref 0–99)
Potassium: 4.7 meq/L (ref 3.5–5.1)
Sodium: 141 meq/L (ref 135–145)
Total Bilirubin: 0.5 mg/dL (ref 0.3–1.2)
Total CHOL/HDL Ratio: 3
Total Protein: 7.5 g/dL (ref 6.0–8.3)
Triglycerides: 77 mg/dL (ref 0.0–149.0)
VLDL: 15.4 mg/dL (ref 0.0–40.0)

## 2010-04-10 ENCOUNTER — Ambulatory Visit: Payer: Self-pay | Admitting: Internal Medicine

## 2010-04-10 ENCOUNTER — Telehealth: Payer: Self-pay | Admitting: Internal Medicine

## 2010-04-11 ENCOUNTER — Encounter: Payer: Self-pay | Admitting: Internal Medicine

## 2010-04-25 ENCOUNTER — Ambulatory Visit: Payer: Self-pay | Admitting: Internal Medicine

## 2010-04-28 ENCOUNTER — Ambulatory Visit: Payer: Self-pay | Admitting: Internal Medicine

## 2010-05-02 ENCOUNTER — Encounter: Payer: Self-pay | Admitting: Internal Medicine

## 2010-05-12 ENCOUNTER — Ambulatory Visit: Payer: Self-pay | Admitting: Internal Medicine

## 2010-05-15 LAB — CONVERTED CEMR LAB
BUN: 23 mg/dL (ref 6–23)
Basophils Absolute: 0 10*3/uL (ref 0.0–0.1)
Basophils Relative: 0 % (ref 0.0–3.0)
Bilirubin Urine: NEGATIVE
CO2: 30 meq/L (ref 19–32)
Calcium: 9.2 mg/dL (ref 8.4–10.5)
Chloride: 100 meq/L (ref 96–112)
Creatinine, Ser: 1.3 mg/dL — ABNORMAL HIGH (ref 0.4–1.2)
Eosinophils Absolute: 0 10*3/uL (ref 0.0–0.7)
Eosinophils Relative: 0 % (ref 0.0–5.0)
GFR calc non Af Amer: 49.36 mL/min (ref >60)
Glucose, Bld: 158 mg/dL — ABNORMAL HIGH (ref 70–99)
HCT: 36.3 % (ref 36.0–46.0)
Hemoglobin: 12.1 g/dL (ref 12.0–15.0)
Ketones, ur: NEGATIVE mg/dL
Lymphocytes Relative: 8.2 % — ABNORMAL LOW (ref 12.0–46.0)
Lymphs Abs: 1.2 10*3/uL (ref 0.7–4.0)
MCHC: 33.3 g/dL (ref 30.0–36.0)
MCV: 90 fL (ref 78.0–100.0)
Monocytes Absolute: 1.3 10*3/uL — ABNORMAL HIGH (ref 0.1–1.0)
Monocytes Relative: 8.3 % (ref 3.0–12.0)
Neutro Abs: 12.7 10*3/uL — ABNORMAL HIGH (ref 1.4–7.7)
Neutrophils Relative %: 83.5 % — ABNORMAL HIGH (ref 43.0–77.0)
Nitrite: NEGATIVE
Platelets: 179 10*3/uL (ref 150.0–400.0)
Potassium: 4.4 meq/L (ref 3.5–5.1)
RBC: 4.04 M/uL (ref 3.87–5.11)
RDW: 18 % — ABNORMAL HIGH (ref 11.5–14.6)
Sodium: 139 meq/L (ref 135–145)
Specific Gravity, Urine: 1.025 (ref 1.000–1.030)
Total Protein, Urine: 100 mg/dL
Urine Glucose: NEGATIVE mg/dL
Urobilinogen, UA: 0.2 (ref 0.0–1.0)
WBC: 15.2 10*3/uL — ABNORMAL HIGH (ref 4.5–10.5)
pH: 5.5 (ref 5.0–8.0)

## 2010-05-30 ENCOUNTER — Encounter: Payer: Self-pay | Admitting: Internal Medicine

## 2010-06-06 ENCOUNTER — Encounter: Payer: Self-pay | Admitting: Internal Medicine

## 2010-06-09 ENCOUNTER — Telehealth: Payer: Self-pay | Admitting: Internal Medicine

## 2010-06-14 ENCOUNTER — Encounter: Payer: Self-pay | Admitting: Internal Medicine

## 2010-07-03 ENCOUNTER — Encounter: Payer: Self-pay | Admitting: Internal Medicine

## 2010-07-05 ENCOUNTER — Telehealth: Payer: Self-pay | Admitting: Internal Medicine

## 2010-07-06 ENCOUNTER — Ambulatory Visit: Payer: Self-pay | Admitting: Internal Medicine

## 2010-07-06 LAB — CONVERTED CEMR LAB
BUN: 43 mg/dL — ABNORMAL HIGH (ref 6–23)
CO2: 24 meq/L (ref 19–32)
Calcium: 9 mg/dL (ref 8.4–10.5)
Chloride: 103 meq/L (ref 96–112)
Creatinine, Ser: 1.4 mg/dL — ABNORMAL HIGH (ref 0.4–1.2)
GFR calc non Af Amer: 45.41 mL/min (ref >60)
Glucose, Bld: 142 mg/dL — ABNORMAL HIGH (ref 70–99)
Hgb A1c MFr Bld: 7.2 % — ABNORMAL HIGH (ref 4.6–6.5)
Potassium: 4.8 meq/L (ref 3.5–5.1)
Sodium: 138 meq/L (ref 135–145)

## 2010-07-10 ENCOUNTER — Ambulatory Visit: Payer: Self-pay | Admitting: Internal Medicine

## 2010-09-22 ENCOUNTER — Ambulatory Visit (HOSPITAL_COMMUNITY): Admission: RE | Admit: 2010-09-22 | Discharge: 2010-09-22 | Payer: Self-pay | Admitting: Internal Medicine

## 2010-09-22 LAB — HM MAMMOGRAPHY: HM Mammogram: NEGATIVE

## 2010-09-27 ENCOUNTER — Ambulatory Visit: Payer: Self-pay | Admitting: Internal Medicine

## 2010-09-27 LAB — CONVERTED CEMR LAB
BUN: 33 mg/dL — ABNORMAL HIGH (ref 6–23)
CO2: 28 meq/L (ref 19–32)
Calcium: 9.4 mg/dL (ref 8.4–10.5)
Chloride: 107 meq/L (ref 96–112)
Creatinine, Ser: 1.3 mg/dL — ABNORMAL HIGH (ref 0.4–1.2)
GFR calc non Af Amer: 51.99 mL/min (ref >60)
Glucose, Bld: 136 mg/dL — ABNORMAL HIGH (ref 70–99)
Hgb A1c MFr Bld: 7.8 % — ABNORMAL HIGH (ref 4.6–6.5)
Potassium: 5.6 meq/L — ABNORMAL HIGH (ref 3.5–5.1)
Sodium: 143 meq/L (ref 135–145)
Vitamin B-12: >1500 pg/mL — ABNORMAL HIGH (ref 211–911)

## 2010-09-29 ENCOUNTER — Ambulatory Visit: Payer: Self-pay | Admitting: Internal Medicine

## 2010-10-02 ENCOUNTER — Ambulatory Visit: Payer: Self-pay | Admitting: Internal Medicine

## 2010-10-02 ENCOUNTER — Telehealth: Payer: Self-pay | Admitting: Internal Medicine

## 2010-10-02 DIAGNOSIS — M79609 Pain in unspecified limb: Secondary | ICD-10-CM | POA: Insufficient documentation

## 2010-10-02 DIAGNOSIS — E875 Hyperkalemia: Secondary | ICD-10-CM | POA: Insufficient documentation

## 2010-10-02 LAB — CONVERTED CEMR LAB
BUN: 33 mg/dL — ABNORMAL HIGH (ref 6–23)
CO2: 29 meq/L (ref 19–32)
Calcium: 9.9 mg/dL (ref 8.4–10.5)
Chloride: 104 meq/L (ref 96–112)
Creatinine, Ser: 1.2 mg/dL (ref 0.4–1.2)
GFR calc non Af Amer: 55.47 mL/min (ref >60)
Glucose, Bld: 112 mg/dL — ABNORMAL HIGH (ref 70–99)
Potassium: 4.7 meq/L (ref 3.5–5.1)
Sodium: 141 meq/L (ref 135–145)
Total CK: 102 units/L (ref 7–177)

## 2010-10-03 ENCOUNTER — Telehealth: Payer: Self-pay | Admitting: Internal Medicine

## 2010-10-13 ENCOUNTER — Encounter: Payer: Self-pay | Admitting: Internal Medicine

## 2010-10-17 ENCOUNTER — Encounter: Payer: Self-pay | Admitting: Internal Medicine

## 2010-10-17 ENCOUNTER — Ambulatory Visit: Payer: Self-pay

## 2010-10-18 ENCOUNTER — Encounter: Payer: Self-pay | Admitting: Internal Medicine

## 2010-10-20 ENCOUNTER — Ambulatory Visit: Payer: Self-pay | Admitting: Internal Medicine

## 2010-10-30 ENCOUNTER — Encounter: Payer: Self-pay | Admitting: Internal Medicine

## 2010-12-14 ENCOUNTER — Encounter: Payer: Self-pay | Admitting: Internal Medicine

## 2010-12-27 ENCOUNTER — Other Ambulatory Visit: Payer: Self-pay | Admitting: Internal Medicine

## 2010-12-27 ENCOUNTER — Ambulatory Visit
Admission: RE | Admit: 2010-12-27 | Discharge: 2010-12-27 | Payer: Self-pay | Source: Home / Self Care | Attending: Internal Medicine | Admitting: Internal Medicine

## 2010-12-27 LAB — BASIC METABOLIC PANEL
BUN: 18 mg/dL (ref 6–23)
CO2: 29 mEq/L (ref 19–32)
Calcium: 9.4 mg/dL (ref 8.4–10.5)
Chloride: 104 mEq/L (ref 96–112)
Creatinine, Ser: 0.8 mg/dL (ref 0.4–1.2)
GFR: 88.1 mL/min (ref >60.00)
Glucose, Bld: 113 mg/dL — ABNORMAL HIGH (ref 70–99)
Potassium: 4.5 mEq/L (ref 3.5–5.1)
Sodium: 141 mEq/L (ref 135–145)

## 2010-12-27 LAB — HEMOGLOBIN A1C: Hgb A1c MFr Bld: 6.9 % — ABNORMAL HIGH (ref 4.6–6.5)

## 2011-01-01 ENCOUNTER — Encounter: Payer: Self-pay | Admitting: Internal Medicine

## 2011-01-01 ENCOUNTER — Ambulatory Visit
Admission: RE | Admit: 2011-01-01 | Discharge: 2011-01-01 | Payer: Self-pay | Source: Home / Self Care | Attending: Internal Medicine | Admitting: Internal Medicine

## 2011-01-01 DIAGNOSIS — R159 Full incontinence of feces: Secondary | ICD-10-CM | POA: Insufficient documentation

## 2011-01-01 DIAGNOSIS — R1011 Right upper quadrant pain: Secondary | ICD-10-CM | POA: Insufficient documentation

## 2011-01-03 ENCOUNTER — Ambulatory Visit
Admission: RE | Admit: 2011-01-03 | Discharge: 2011-01-03 | Payer: Self-pay | Source: Home / Self Care | Attending: Internal Medicine | Admitting: Internal Medicine

## 2011-01-03 DIAGNOSIS — R209 Unspecified disturbances of skin sensation: Secondary | ICD-10-CM | POA: Insufficient documentation

## 2011-01-08 ENCOUNTER — Telehealth: Payer: Self-pay | Admitting: Internal Medicine

## 2011-01-09 ENCOUNTER — Encounter: Payer: Self-pay | Admitting: Internal Medicine

## 2011-01-09 ENCOUNTER — Ambulatory Visit
Admission: RE | Admit: 2011-01-09 | Discharge: 2011-01-09 | Payer: Self-pay | Source: Home / Self Care | Attending: Internal Medicine | Admitting: Internal Medicine

## 2011-01-09 LAB — HM COLONOSCOPY

## 2011-01-09 LAB — HM DIABETES FOOT EXAM

## 2011-01-10 LAB — GLUCOSE, CAPILLARY
Glucose-Capillary: 139 mg/dL — ABNORMAL HIGH (ref 70–99)
Glucose-Capillary: 103 mg/dL — ABNORMAL HIGH (ref 70–99)

## 2011-01-11 ENCOUNTER — Encounter: Payer: Self-pay | Admitting: Internal Medicine

## 2011-01-14 ENCOUNTER — Encounter: Payer: Self-pay | Admitting: Gastroenterology

## 2011-01-17 ENCOUNTER — Emergency Department (HOSPITAL_COMMUNITY)
Admission: EM | Admit: 2011-01-17 | Discharge: 2011-01-18 | Payer: Self-pay | Source: Home / Self Care | Admitting: Emergency Medicine

## 2011-01-23 NOTE — Assessment & Plan Note (Signed)
Summary: 3 MTH FU-$50-STC   Vital Signs:  Patient Profile:   77 Years Old Female Height:     63 inches Weight:      213 pounds Temp:     98.2 degrees F oral Pulse rate:   51 / minute BP sitting:   126 / 80  (left arm)  Vitals Entered By: Tora Perches (August 04, 2008 9:39 AM)                 Chief Complaint:  Multiple medical problems or concerns.  History of Present Illness: The patient presents for a follow up of hypertension, diabetes, hyperlipidemia     Current Allergies (reviewed today): ! ASPIRIN EC (ASPIRIN) ! CODEINE SULFATE (CODEINE SULFATE) ! DIOVAN (VALSARTAN) ! MOTRIN IB (IBUPROFEN)  Past Medical History:    Reviewed history from 02/06/2008 and no changes required:       Colonic polyps, hx of       Diabetes mellitus, type II       Diverticulitis, hx of       Gout       Hypertension       Osteoarthritis       Hyperlipidemia       Pulmonary HTN       L hydronephrosis       L adrenal adenoma 2006       GERD       Boils 2009   Family History:    Reviewed history from 11/06/2007 and no changes required:       Family History Diabetes 1st degree relative       Family History Hypertension  Social History:    Reviewed history from 11/06/2007 and no changes required:       Retired       Married       Never Smoked    Review of Systems  The patient denies fever, weight gain, vision loss, chest pain, syncope, abdominal pain, and melena.     Physical Exam  General:     overweight-appearing.   Head:     Normocephalic and atraumatic without obvious abnormalities. No apparent alopecia or balding. Eyes:     No corneal or conjunctival inflammation noted. EOMI. Perrla. Funduscopic exam benign, without hemorrhages, exudates or papilledema. Vision grossly normal. Nose:     External nasal examination shows no deformity or inflammation. Nasal mucosa are pink and moist without lesions or exudates. Mouth:     Oral mucosa and oropharynx without lesions  or exudates.  Teeth in good repair. Neck:     No deformities, masses, or tenderness noted. Lungs:     Normal respiratory effort, chest expands symmetrically. Lungs are clear to auscultation, no crackles or wheezes. Heart:     Normal rate and regular rhythm. S1 and S2 normal without gallop, murmur, click, rub or other extra sounds. Abdomen:     Bowel sounds positive,abdomen soft and non-tender without masses, organomegaly or hernias noted. Msk:     No deformity or scoliosis noted of thoracic or lumbar spine.   Neurologic:     No cranial nerve deficits noted. Station and gait are normal. Plantar reflexes are down-going bilaterally. DTRs are symmetrical throughout. Sensory, motor and coordinative functions appear intact.    Impression & Recommendations:  Problem # 1:  HYPERLIPIDEMIA (ICD-272.4) Assessment: Unchanged  Problem # 2:  OSTEOARTHRITIS (ICD-715.90) Assessment: Improved  Problem # 3:  HYPERTENSION (ICD-401.9) Assessment: Improved  Her updated medication list for this problem includes:  Verapamil Hcl Cr 180 Mg Cp24 (Verapamil hcl) ..... Once daily    Torsemide 20 Mg Tabs (Torsemide) .Marland Kitchen... Take 1 by mouth qd    Lisinopril 10 Mg Tabs (Lisinopril) .Marland Kitchen... Take 1 tab by mouth daily  BP today: 126/80 Prior BP: 169/80 (05/31/2008)  Labs Reviewed: Creat: 0.7 (07/29/2008) Chol: 157 (07/28/2007)   HDL: 45.8 (07/28/2007)   LDL: 97 (07/28/2007)   TG: 70 (07/28/2007)   Problem # 4:  GOUT (ICD-274.9) Assessment: Unchanged  Her updated medication list for this problem includes:    Allopurinol 300 Mg Tabs (Allopurinol) ..... Once daily   Problem # 5:  DIABETES MELLITUS, TYPE II (ICD-250.00) Assessment: Improved Lost 6 lbs Her updated medication list for this problem includes:    Metformin Hcl 500 Mg Tabs (Metformin hcl) .Marland Kitchen... Take 1 tab twice daily    Lisinopril 10 Mg Tabs (Lisinopril) .Marland Kitchen... Take 1 tab by mouth daily   Complete Medication List: 1)  Verapamil Hcl Cr 180  Mg Cp24 (Verapamil hcl) .... Once daily 2)  Allopurinol 300 Mg Tabs (Allopurinol) .... Once daily 3)  Amitriptyline Hcl 25 Mg Tabs (Amitriptyline hcl) .... Once daily 4)  Detrol La 2 Mg Cp24 (Tolterodine tartrate) .... Once daily 5)  Triamcinolone Acetonide 0.5 % Crea (Triamcinolone acetonide) .... Bid 6)  Torsemide 20 Mg Tabs (Torsemide) .... Take 1 by mouth qd 7)  Omeprazole 20 Mg Cpdr (Omeprazole) .... Take 2 tabs daily 8)  Vitamin D3 1000 Unit Tabs (Cholecalciferol) .Marland Kitchen.. 1 by mouth daily 9)  Doxycycline Hyclate 100 Mg Caps (Doxycycline hyclate) .... Take 1 tab twice a day x 7 d prn 10)  Metformin Hcl 500 Mg Tabs (Metformin hcl) .... Take 1 tab twice daily 11)  Lisinopril 10 Mg Tabs (Lisinopril) .... Take 1 tab by mouth daily 12)  Bactroban 2 % Oint (Mupirocin) .... Use qid 13)  Eye Drops  .... Once daily   Patient Instructions: 1)  Please schedule a follow-up appointment in 3 months. 2)  BMP prior to visit, ICD-9: 3)  HbgA1C prior to visit, ICD-9: 250.00   ]

## 2011-01-23 NOTE — Consult Note (Signed)
Summary: Re: Diverticulosis   NAME:  Breanna [name], Breanna [name] NO.:  [id number]   MEDICAL RECORD NO.:  [id number]          PATIENT TYPE:  INP   LOCATION:  6703                         FACILITY:  [facility]   PHYSICIAN:  Cherylynn Ridges, M.D.    DATE OF BIRTH:  1931/07/10   DATE OF CONSULTATION:  06/06/2005  DATE OF DISCHARGE:                                   CONSULTATION   IDENTIFICATION/CHIEF COMPLAINT:  Dear Dr. Posey Rea, thank you for asking me  to see Breanna [name], a very pleasant 77 year old female with a history of  diverticulitis, who is being admitted because of symptoms that have  continued in spite of oral antibiotics as an outpatient.   The patient reports to me having been admitted near the end of May between  say the 26th and the 30th, for complaints of acute diverticulitis confirmed  by CT scan at that time.  She saw Dr. Claud Kelp of Surgery as an  outpatient and was scheduled for surgery on June 15, 2005, however because  her symptoms have persisted to where she can hardly eat anything, she has  continued abdominal pain rated as a 6/10, occasional chills and fevers, she  admitted her to the hospital for IV therapy and consideration for earlier  surgical treatment.  She has had no other studies done and no repeat CT scan  done.   PAST MEDICAL HISTORY:  Significant for;  1.  Adult-onset diabetes mellitus.  2.  Hypertension.  3.  History of glaucoma.  4.  Iron deficiency anemia.  5.  Questionable pulmonary hypertension noted on cardiac catheterization      March 2005.  6.  She has had history of bilateral ureteral obstruction requiring surgery      back in the 1970s.  7.  She has a history of spinal stenosis.  8.  History of gastroduodenitis.   PAST SURGICAL HISTORY:  1.  Back surgery.  2.  Left total knee arthroplasty.  3.  Cataract surgery.  4.  Renal surgery or kidney surgery for obstruction.  5.  She has had a hysterectomy.   ALLERGIES:  1.   DARVON.  2.  ASPIRIN.  3.  CODEINE.  4.  IBUPROFEN.  5.  HYDROCHLOROTHIAZIDE.   CURRENT MEDICATIONS:  As of her last hospitalization include;  1.  Verapamil 360 mg every day.  2.  Hyoscyamine 0.375 mg p.o. daily.  3.  Niferex twice daily, dosage unknown.  4.  Protonix 40 mg a day.  5.  Allopurinol 300 mg a day.  6.  Torsemide 20 mg a day.  7.  Potassium chloride 20 mEq twice a day.  8.  Actos 40 mg a day.  9.  Atenolol 25 mg daily.  10. Avapro 300 mg daily.  11. She also takes one capsule of fish oil a day.  12. Senior multivitamin a day.  13. Also, she has history of glaucoma for which she takes three different      eye drops one of which is timolol.   SOCIAL HISTORY:  She is  a nondrinker, nonsmoker.   FAMILY HISTORY:  Noncontributory.   My examination of the patient today -- she is normocephalic and atraumatic  and anicteric.  She is very articulate, smart, and knows exactly what is  going on with her, she rates her pain a 6/10.  NECK:  Supple, no bruits.  CHEST:  Clear to auscultation.  CARDIAC:  Regular rate and rhythm, no murmurs, gallops, rubs or heaves.  ABDOMEN:  Soft, but tender in the left lower quadrant extending up to left  side up towards the left costal margin.  She has got present, but hypoactive  bowel sounds, and she just ate a regular meal.  She has got no rebound or  guarding.  RECTAL:  Exam was not performed.  PELVIC:  Exam was not performed.   Laboratories are done -- her white count is normal, her hemoglobin is low at  10.9, hematocrit 32.7.  Her electrolytes are within normal limits.  I have  not seen her recent CT scan, however I will review that prior to signing her  out to the Surgical Service tomorrow.   IMPRESSION:  Ongoing diverticulitis in an otherwise fairly healthy female  with a history of hypertension and diabetes.  Because of her failure to  improve with oral antibiotics, IV antibiotics are indicated, and we will try  through our  service to have her colectomy done during this hospitalization  after a bowel prep, which may take the weekend for the patient to have her  surgical intervention next week.  We will discuss with her the options over  the next 24-48 hours.  In the meantime, I do think that placing the patient  on clear liquids may assist with her symptom, and I will limit her diet.  Also she may need to have her Lovenox held prior to surgery.   PLAN:  Our plan is to go ahead with surgery at some point during this  hospitalization, but IV antibiotics are currently necessary.  Perhaps repeat  of her CT scan will be indicated also, but currently no radiologic studies  will be done.  I do not believe the patient has peritonitis significant  enough to warrant x-rays to look for free air.       JOW/MEDQ  D:  06/06/2005  T:  06/07/2005  Job:  [id number]

## 2011-01-23 NOTE — Progress Notes (Signed)
Summary: heating pad rx  Phone Note Other Incoming   Call placed by: [facility] [phone] Summary of Call: ref# [id number]................. called in ref of pt heating pad applications says it was originally faxed in November and they have not heard anything.... so they want someone to try to find out the status for them Initial call taken by: Windell Norfolk,  January 10, 2009 11:57 AM  Follow-up for Phone Call        called her back and told her to re-fax paperwork  person I spoke with states they are going to refax info to triage side A and I will put it will Dr. Hyman Bible papers   waiting on fax  Follow-up by: Windell Norfolk,  January 10, 2009 4:29 PM  Additional Follow-up for Phone Call Additional follow up Details #1::        rec fax placed with Dr. Octavia Bruckner papers Additional Follow-up by: Windell Norfolk,  January 11, 2009 8:34 AM    Additional Follow-up for Phone Call Additional follow up Details #2::    Done Follow-up by: Tresa Garter MD,  January 13, 2009 10:39 PM

## 2011-01-23 NOTE — Progress Notes (Signed)
Summary: PANTOPRAZOLE NOT ON MED LIST   Phone Note Outgoing Call   Call placed by: Lamona Curl CMA Duncan Dull),  July 05, 2010 4:36 PM Call placed to: Patient Summary of Call: Our office got Nexium approved from [facility]. However, pharmacy kept getting a kickback from [employer]. After calling [facility], it was found that patient last got pantoprazole mailed from them to her on 06/19/10.  Apparently patient got a prescription from Dr Jonny Ruiz last year but this was taken off of her medlist by someone. Therefore, Dr Juanda Chance was unaware patient was already on a PPI. After extensively reviewing the chart, looks like Dr Plotnikov's office sent refills on 04/05/10 for #90 with 3 refills. This prescription was faxed and not entered in the medication list either. Patient should have enough pantoprazole refills to get her through until 04/06/11. I have advised patient that she should continue pantoprazole two times a day and we will d/c Nexium prescription since it does the same thing and we were unaware that she was already taking a PPI. Patient verbalizes understanding. I will again enter pantoprazole on patient's medication list.  Initial call taken by: Lamona Curl CMA Duncan Dull),  July 05, 2010 4:43 PM    New/Updated Medications: PANTOPRAZOLE SODIUM 40 MG TBEC (PANTOPRAZOLE SODIUM) Take 1 tablet by mouth two times a day

## 2011-01-23 NOTE — Assessment & Plan Note (Signed)
Summary: DYSPHAGIA/YF    History of Present Illness Visit Type: Follow-up Visit Primary GI MD: Lina Sar MD Primary Provider: Sonda Primes, MD Requesting Provider: Sonda Primes, MD Chief Complaint: dysphagia History of Present Illness:   This is a 77 year old white female who has a several month history of substernal chest pain which occurs about every 2-3 weeks. It lasts for several hours. It is precipitated by eating or by bending over and feels like a lump or food that is stuck. She has a history of esophageal stricture which was found on an upper endoscopy by Dr. Corinda Gubler in 1994. A subsequent upper endoscopy in 2005 showed a small hiatal hernia but no stricture. She used to be on Protonix 40 mg daily but currently is on Prilosec 40 mg daily. She does not get good relief of her reflux. Her past medical history is complicated by bouts of diverticulitis. She underwent a sigmoid resection in June 2005 with repair of a cecal tear. A CT Scan of the abdomen in January 2008 showed thickening of the small bowel which did was not found on a  subsequent small bowel follow-through. Her liver function tests were slightly elevated with her alkaline phosphatase being 135 but repeat enzymes were normal. She denies any change in her bowel habits. Her last colonoscopy was in June 2005.   GI Review of Systems    Reports chest pain, dysphagia with liquids, and  dysphagia with solids.      Denies abdominal pain, acid reflux, belching, bloating, heartburn, loss of appetite, nausea, vomiting, vomiting blood, weight loss, and  weight gain.      Reports change in bowel habits and  constipation.     Denies anal fissure, black tarry stools, diarrhea, diverticulosis, fecal incontinence, heme positive stool, hemorrhoids, irritable bowel syndrome, jaundice, light color stool, liver problems, rectal bleeding, and  rectal pain.    Current Medications (verified): 1)  Torsemide 20 Mg  Tabs (Torsemide) .... Take 1 By  Mouth Qd 2)  Allopurinol 300 Mg  Tabs (Allopurinol) .Marland Kitchen.. 1 Once Daily 3)  Amitriptyline Hcl 25 Mg  Tabs (Amitriptyline Hcl) .... Once Daily 4)  Triamcinolone Acetonide 0.5 % Crea (Triamcinolone Acetonide) .... Bid 5)  Omeprazole 20 Mg Cpdr (Omeprazole) .... Take 2 Tabs Daily 6)  Vitamin D3 1000 Unit  Tabs (Cholecalciferol) .Marland Kitchen.. 1 By Mouth Daily 7)  Doxycycline Hyclate 100 Mg Caps (Doxycycline Hyclate) .... Take 1 Tab Twice A Day X 7 D Prn 8)  Metformin Hcl 500 Mg Tabs (Metformin Hcl) .... Take 1 Tab Twice Daily 9)  Bactroban 2 %  Oint (Mupirocin) .... Use Qid 10)  Hibiclens 4 % Liqd (Chlorhexidine Gluconate) .... Use Qd 11)  Coreg 25 Mg Tabs (Carvedilol) .Marland Kitchen.. 1 By Mouth Two Times A Day 12)  Eye Drops .... Once Daily 13)  Oxybutynin Chloride 15 Mg Xr24h-Tab (Oxybutynin Chloride) .... Once Daily 14)  Amlodipine Besylate 5 Mg Tabs (Amlodipine Besylate) .Marland Kitchen.. 1 By Mouth Qd 15)  Benzonatate 200 Mg Caps (Benzonatate) .Marland Kitchen.. 1 By Mouth Three Times A Day As Needed Cough 16)  Terazol 7 0.4 % Crea (Terconazole) .... Use Pv X 7 D As Needed Hs  Allergies (verified): 1)  ! Aspirin Ec (Aspirin) 2)  ! Codeine Sulfate (Codeine Sulfate) 3)  ! Diovan (Valsartan) 4)  ! Motrin Ib (Ibuprofen) 5)  Lisinopril (Lisinopril) 6)  Verapamil  Past History:  Past Medical History: Reviewed history from 02/06/2008 and no changes required. Colonic polyps, hx of Diabetes mellitus, type II Diverticulitis,  hx of Gout Hypertension Osteoarthritis Hyperlipidemia Pulmonary HTN L hydronephrosis L adrenal adenoma 2006 GERD Boils 2009  Past Surgical History: Reviewed history from 06/14/2009 and no changes required. Colectomy Hysterectomy Total knee replacement L 2003 R rot cuff surg 08 Right Breast Biopsy Knee replacement R Surgical Intervention for bilateral hydronephrosis Cataract Extraction -Bilateral Bilateral Foot Reconstruction Vericose Vein Stripping-lower extremities  Family History: Reviewed  history from 06/14/2009 and no changes required. Family History Diabetes 1st degree relative Family History Hypertension Family History of Heart Disease:  No FH of Colon Cancer:  Social History: Reviewed history from 06/14/2009 and no changes required. Retired Married Never Smoked Alcohol Use - no Illicit Drug Use - no  Review of Systems       The patient complains of sleeping problems and urine leakage.    Vital Signs:  Patient profile:   77 year old female Height:      64 inches Weight:      194 pounds BMI:     33.42 Pulse rate:   56 / minute Pulse rhythm:   regular BP sitting:   136 / 78  (left arm)  Vitals Entered By: Milford Cage CMA (June 17, 2009 8:30 AM)  Physical Exam  General:  alert, oriented and very pleasant. Eyes:  nonicteric. Mouth:  no ulcers. Neck:  Supple; no masses or thyromegaly. Chest Wall:  no tenderness of the chest wall. Heart:  clear breath sounds. Normal S1, normal S2. Abdomen:  obese soft abdomen without tenderness. Well-healed surgical scars in the lower abdomen. Liver edge at costal margin. Rectal:  normal to slightly decreased rectal tone. Stool is soft and hemoccult negative.   Impression & Recommendations:  Problem # 1:  DYSPHAGIA (ICD-787.20)  intermittent dysphagia with substernal chest pain likely related to gastroesophageal reflux or possibly due to a hiatal hernia with prolapse. I have asked the patient to avoid excessive bending  and eating late at night. She received samples of Dexilant 60 mg daily and we will set her up for an upper endoscopy and possible esophageal dilation.  Orders: EGD (EGD)  Problem # 2:  DIVERTICULITIS, HX OF (ICD-V12.79) status post sigmoid resection in 2006. She has no complaints today. Her next colonoscopy will be due in June 2015.  Patient Instructions: 1)  upper endoscopy with possible dilatation 2)  Dexilant 60 mg daily 3)  antireflux measures 4)  Recall colonoscopy June 2015 5)  Copy sent  to : Dr A.Plotnikov

## 2011-01-23 NOTE — Letter (Signed)
Summary: Dr Majel Homer  Dr Majel Homer   Imported By: Lester [place] 03/24/2010 09:28:20  _____________________________________________________________________  External Attachment:    Type:   Image     Comment:   External Document

## 2011-01-23 NOTE — Assessment & Plan Note (Signed)
Summary: 3 MO ROV / $50 /[facility]   Vital Signs:  Patient profile:   77 year old female Height:      64 inches Weight:      197 pounds BMI:     33.94 Temp:     98.5 degrees F oral Pulse rate:   68 / minute BP sitting:   138 / 72  (left arm)  Vitals Entered By: Tora Perches (May 16, 2009 9:22 AM) CC: f/u Is Patient Diabetic? Yes   CC:  f/u.  History of Present Illness: The patient presents for a follow up of hypertension, diabetes, hyperlipidemia C/o chocking sensation w/meals x wks off and on. C/o pain strait to back when it would happen ; water would help.  Current Medications (verified): 1)  Torsemide 20 Mg  Tabs (Torsemide) .... Take 1 By Mouth Qd 2)  Allopurinol 300 Mg  Tabs (Allopurinol) .Marland Kitchen.. 1 Once Daily 3)  Amitriptyline Hcl 25 Mg  Tabs (Amitriptyline Hcl) .... Once Daily 4)  Triamcinolone Acetonide 0.5 % Crea (Triamcinolone Acetonide) .... Bid 5)  Omeprazole 20 Mg Cpdr (Omeprazole) .... Take 2 Tabs Daily 6)  Vitamin D3 1000 Unit  Tabs (Cholecalciferol) .Marland Kitchen.. 1 By Mouth Daily 7)  Doxycycline Hyclate 100 Mg Caps (Doxycycline Hyclate) .... Take 1 Tab Twice A Day X 7 D Prn 8)  Metformin Hcl 500 Mg Tabs (Metformin Hcl) .... Take 1 Tab Twice Daily 9)  Bactroban 2 %  Oint (Mupirocin) .... Use Qid 10)  Hibiclens 4 % Liqd (Chlorhexidine Gluconate) .... Use Qd 11)  Coreg 25 Mg Tabs (Carvedilol) .Marland Kitchen.. 1 By Mouth Two Times A Day 12)  Eye Drops .... Once Daily 13)  Oxybutynin Chloride 15 Mg Xr24h-Tab (Oxybutynin Chloride) .... Once Daily 14)  Amlodipine Besylate 5 Mg Tabs (Amlodipine Besylate) .Marland Kitchen.. 1 By Mouth Qd  Allergies: 1)  ! Aspirin Ec (Aspirin) 2)  ! Codeine Sulfate (Codeine Sulfate) 3)  ! Diovan (Valsartan) 4)  ! Motrin Ib (Ibuprofen) 5)  Lisinopril (Lisinopril) 6)  Verapamil  Past History:  Past Medical History:    Colonic polyps, hx of    Diabetes mellitus, type II    Diverticulitis, hx of    Gout    Hypertension    Osteoarthritis    Hyperlipidemia    Pulmonary  HTN    L hydronephrosis    L adrenal adenoma 2006    GERD    Boils 2009     (02/06/2008)  Past Surgical History:    Colectomy    Hysterectomy    Total knee replacement L 2003    R rot cuff surg 08     (11/06/2007)  Family History:    Family History Diabetes 1st degree relative    Family History Hypertension     (11/06/2007)  Social History:    Retired    Married    Never Smoked     (11/06/2007)   Impression & Recommendations:  Problem # 1:  DYSPHAGIA (ICD-787.20) Assessment New Cont Omeprazole Orders: Gastroenterology Referral (GI) Dr Juanda Chance  Problem # 2:  CERUMEN IMPACTION (ICD-380.4) Assessment: New Removed w/loop  Problem # 3:  GERD (ICD-530.81) Assessment: Deteriorated  Her updated medication list for this problem includes:    Omeprazole 20 Mg Cpdr (Omeprazole) .Marland Kitchen... Take 2 tabs daily  Problem # 4:  HYPERTENSION (ICD-401.9) Assessment: Unchanged The labs were reviewed with the patient.  Her updated medication list for this problem includes:    Torsemide 20 Mg Tabs (Torsemide) .Marland Kitchen... Take 1 by  mouth qd    Coreg 25 Mg Tabs (Carvedilol) .Marland Kitchen... 1 by mouth two times a day    Amlodipine Besylate 5 Mg Tabs (Amlodipine besylate) .Marland Kitchen... 1 by mouth qd  Problem # 5:  BOILS, RECURRENT (ICD-680.9) Assessment: Unchanged On prescription therapy   Problem # 6:  DIABETES MELLITUS, TYPE II (ICD-250.00) Assessment: Improved  Her updated medication list for this problem includes:    Metformin Hcl 500 Mg Tabs (Metformin hcl) .Marland Kitchen... Take 1 tab twice daily  Labs Reviewed: Creat: 0.9 (05/05/2009)    Reviewed HgBA1c results: 6.9 (05/05/2009)  6.7 (02/07/2009)  Complete Medication List: 1)  Torsemide 20 Mg Tabs (Torsemide) .... Take 1 by mouth qd 2)  Allopurinol 300 Mg Tabs (Allopurinol) .Marland Kitchen.. 1 once daily 3)  Amitriptyline Hcl 25 Mg Tabs (Amitriptyline hcl) .... Once daily 4)  Triamcinolone Acetonide 0.5 % Crea (Triamcinolone acetonide) .... Bid 5)  Omeprazole 20 Mg Cpdr  (Omeprazole) .... Take 2 tabs daily 6)  Vitamin D3 1000 Unit Tabs (Cholecalciferol) .Marland Kitchen.. 1 by mouth daily 7)  Doxycycline Hyclate 100 Mg Caps (Doxycycline hyclate) .... Take 1 tab twice a day x 7 d prn 8)  Metformin Hcl 500 Mg Tabs (Metformin hcl) .... Take 1 tab twice daily 9)  Bactroban 2 % Oint (Mupirocin) .... Use qid 10)  Hibiclens 4 % Liqd (Chlorhexidine gluconate) .... Use qd 11)  Coreg 25 Mg Tabs (Carvedilol) .Marland Kitchen.. 1 by mouth two times a day 12)  Eye Drops  .... Once daily 13)  Oxybutynin Chloride 15 Mg Xr24h-tab (Oxybutynin chloride) .... Once daily 14)  Amlodipine Besylate 5 Mg Tabs (Amlodipine besylate) .Marland Kitchen.. 1 by mouth qd 15)  Benzonatate 200 Mg Caps (Benzonatate) .Marland Kitchen.. 1 by mouth three times a day as needed cough 16)  Terazol 7 0.4 % Crea (Terconazole) .... Use pv x 7 d as needed hs  Patient Instructions: 1)  Please schedule a follow-up appointment in 4 months. 2)  BMP prior to visit, ICD-9: 3)  Hepatic Panel prior to visit, ICD-9: 250.00 995.20 4)  HbgA1C prior to visit, ICD-9: 5)  Try Aloe vera leaf juce for boils Prescriptions: TERAZOL 7 0.4 % CREA (TERCONAZOLE) use pv x 7 d as needed hs  #60 g x 3   Entered and Authorized by:   Tresa Garter MD   Signed by:   Tresa Garter MD on 05/16/2009   Method used:   [facility]        [facility] [address] [phone]* (retail)       [address] [address]       [place], [place]  [address]       Ph: [phone] or [phone]       Fax: [phone]   RxID:   [id number] BENZONATATE 200 MG CAPS (BENZONATATE) 1 by mouth three times a day as needed cough  #90 x 1   Entered and Authorized by:   Tresa Garter MD   Signed by:   Tresa Garter MD on 05/16/2009   Method used:   [facility]        [facility]  [address] [facility] [address] [phone]* (retail)       [address]       [place], [place]  [address]       Ph: [phone] or [phone]       Fax: [phone]    RxID:   [id number]

## 2011-01-23 NOTE — Assessment & Plan Note (Signed)
Summary: Breanna [name] $50   Vital Signs:  Patient Profile:   77 Years Old Female Height:     63 inches Weight:      219 pounds Temp:     98.1 degrees F oral Pulse rate:   61 / minute BP sitting:   169 / 80  (left arm)  Vitals Entered By: Tora Perches (May 31, 2008 4:17 PM)                 Chief Complaint:  Breanna.  History of Present Illness: C/o a recurrent Breanna in L groin x 3 d with pain    Current Allergies (reviewed today): ! ASPIRIN EC (ASPIRIN) ! CODEINE SULFATE (CODEINE SULFATE) ! DIOVAN (VALSARTAN) ! MOTRIN IB (IBUPROFEN)  Past Medical History:    Reviewed history from 02/06/2008 and no changes required:       Colonic polyps, hx of       Diabetes mellitus, type II       Diverticulitis, hx of       Gout       Hypertension       Osteoarthritis       Hyperlipidemia       Pulmonary HTN       L hydronephrosis       L adrenal adenoma 2006       GERD       Boils 2009   Family History:    Reviewed history from 11/06/2007 and no changes required:       Family History Diabetes 1st degree relative       Family History Hypertension  Social History:    Reviewed history from 11/06/2007 and no changes required:       Retired       Married       Never Smoked    Review of Systems  The patient denies fever.     Physical Exam  General:     overweight-appearing.   Lungs:     Normal respiratory effort, chest expands symmetrically. Lungs are clear to auscultation, no crackles or wheezes. Heart:     Normal rate and regular rhythm. S1 and S2 normal without gallop, murmur, click, rub or other extra sounds. Abdomen:     Bowel sounds positive,abdomen soft and non-tender without masses, organomegaly or hernias noted. Skin:     8x7 mm furuncle on L pubic area, no fluctuation or d/c    Impression & Recommendations:  Problem # 1:  BOILS, RECURRENT (ICD-680.9) L pubis Assessment: New Doxy x 7 d as needed. No need for I&D Dial soap Stop using powder Use  deodorant with antipersp.  Complete Medication List: 1)  Verapamil Hcl Cr 180 Mg Cp24 (Verapamil hcl) .... Once daily 2)  Allopurinol 300 Mg Tabs (Allopurinol) .... Once daily 3)  Amitriptyline Hcl 25 Mg Tabs (Amitriptyline hcl) .... Once daily 4)  Detrol La 2 Mg Cp24 (Tolterodine tartrate) .... Once daily 5)  Triamcinolone Acetonide 0.5 % Crea (Triamcinolone acetonide) .... Bid 6)  Torsemide 20 Mg Tabs (Torsemide) .... Take 1 by mouth qd 7)  Omeprazole 20 Mg Cpdr (Omeprazole) .... Take 2 tabs daily 8)  Vitamin D3 1000 Unit Tabs (Cholecalciferol) .Marland Kitchen.. 1 by mouth daily 9)  Eye Drops  .... Once daily 10)  Doxycycline Hyclate 100 Mg Caps (Doxycycline hyclate) .... Take 1 tab twice a day x 7 d prn 11)  Metformin Hcl 500 Mg Tabs (Metformin hcl) .... Take 1 tab twice daily 12)  Lisinopril 10 Mg Tabs (Lisinopril) .... Take 1 tab by mouth daily   Patient Instructions: 1)  Use Doxycycline x 7 d as needed for boils   Prescriptions: DOXYCYCLINE HYCLATE 100 MG CAPS (DOXYCYCLINE HYCLATE) Take 1 tab twice a day x 7 d prn  #56 x 3   Entered and Authorized by:   Tresa Garter MD   Signed by:   Tresa Garter MD on 05/31/2008   Method used:   Print then Give to Patient   RxID:   [id number]  ]

## 2011-01-23 NOTE — Letter (Signed)
Summary: EGD Instructions  [facility]  [address] [place], [place] [address]   Phone: [phone]  Fax: [phone]       Breanna [name]    13-Nov-1931    MRN: [id number]       Procedure Day /Date: 04/28/10 Friday     Arrival Time: 1:00 pm     Procedure Time: 2:00 pm     Location of Procedure:                    _x  _  [facility]  PREPARATION FOR ENDOSCOPY   On 04/28/10 THE DAY OF THE PROCEDURE:  1.   No solid foods, milk or milk products are allowed after midnight the night before your procedure.  2.   Do not drink anything colored red or purple.  Avoid juices with pulp.  No orange juice.  3.  You may drink clear liquids until 12:00 pm, which is 2 hours before your procedure.                                                                                                CLEAR LIQUIDS INCLUDE: Water Jello Ice Popsicles Tea (sugar ok, no milk/cream) Powdered fruit flavored drinks Coffee (sugar ok, no milk/cream) Gatorade Juice: apple, white grape, white cranberry  Lemonade Clear bullion, consomm, broth Carbonated beverages (any kind) Strained chicken noodle soup Hard Candy   MEDICATION INSTRUCTIONS  Unless otherwise instructed, you should take regular prescription medications with a small sip of water as early as possible the morning of your procedure.                  OTHER INSTRUCTIONS  You will need a responsible adult at least 75 years of age to accompany you and drive you home.   This person must remain in the waiting room during your procedure.  Wear loose fitting clothing that is easily removed.  Leave jewelry and other valuables at home.  However, you may wish to bring a book to read or an iPod/MP3 player to listen to music as you wait for your procedure to start.  Remove all body piercing jewelry and leave at home.  Total time from sign-in until discharge is approximately 2-3 hours.  You should go home  directly after your procedure and rest.  You can resume normal activities the day after your procedure.  The day of your procedure you should not:   Drive   Make legal decisions   Operate machinery   Drink alcohol   Return to work  You will receive specific instructions about eating, activities and medications before you leave.    The above instructions have been reviewed and explained to me by   Lamona Curl CMA Duncan Dull)  Apr 25, 2010 9:59 AM     I fully understand and can verbalize these instructions _____________________________ Date 04/25/10

## 2011-01-23 NOTE — Assessment & Plan Note (Signed)
Summary: 3 mos f/u #/ cd   Vital Signs:  Patient profile:   77 year old female Height:      64 inches (162.56 cm) Weight:      191 pounds (86.82 kg) Temp:     98.3 degrees F (36.83 degrees C) oral Pulse rate:   64 / minute Pulse rhythm:   regular Resp:     16 per minute BP sitting:   122 / 80  (left arm) Cuff size:   regular  Vitals Entered By: Lanier Prude, CMA(AAMA) (July 10, 2010 9:06 AM) CC: 3 mo f/u. C/O Rt hip pain x 2 wks   Primary Care Provider:  Sula Soda, MD   CC:  3 mo f/u. C/O Rt hip pain x 2 wks.  History of Present Illness: The patient presents for a follow up of hypertension, diabetes, hyperlipidemia C/o forgetfulness C/o stress.  C/o R hip pain  -  worse  Current Medications (verified): 1)  Torsemide 20 Mg  Tabs (Torsemide) .... Take 1 By Mouth Qd 2)  Allopurinol 300 Mg  Tabs (Allopurinol) .Marland Kitchen.. 1 Once Daily 3)  Amitriptyline Hcl 25 Mg  Tabs (Amitriptyline Hcl) .Marland Kitchen.. 1 Once Daily At [facility] Prn 4)  Triamcinolone Acetonide 0.5 % Crea (Triamcinolone Acetonide) .... Two Times A Day Prn 5)  Vitamin D3 2000 Unit Caps (Cholecalciferol) .... Take One By Mouth Once Daily 6)  Doxycycline Hyclate 100 Mg Caps (Doxycycline Hyclate) .... Take 1 Tab Twice A Day X 7 D Prn 7)  Metformin Hcl 500 Mg Tabs (Metformin Hcl) .... Take 1 Tab Twice Daily 8)  Bactroban 2 %  Oint (Mupirocin) .... Use Qid Prn 9)  Coreg 25 Mg Tabs (Carvedilol) .Marland Kitchen.. 1 By Mouth Two Times A Day 10)  Eye Drops .... Once Daily 11)  Amlodipine Besylate 5 Mg Tabs (Amlodipine Besylate) .Marland Kitchen.. 1 By Mouth Qd 12)  Benzonatate 200 Mg Caps (Benzonatate) .Marland Kitchen.. 1 By Mouth Three Times A Day As Needed Cough 13)  Terazol 7 0.4 % Crea (Terconazole) .... Use Pv X 7 D As Needed Hs 14)  Aspirin 81 Mg  Tbec (Aspirin) .... One By Mouth Every Day 15)  Spironolactone 50 Mg Tabs (Spironolactone) .Marland Kitchen.. 1 By Mouth Qam For Blood Pressure 16)  Aloe Vera .... Take One By Mouth Once Daily 17)  Levsin/sl 0.125 Mg Subl (Hyoscyamine  Sulfate) .... Take 1 Under Tongue Every 4-6 Hours As Needed For Pain and Spasms 18)  Pantoprazole Sodium 40 Mg Tbec (Pantoprazole Sodium) .... Take 1 Tablet By Mouth Two Times A Day  Allergies (verified): 1)  ! Aspirin Ec (Aspirin) 2)  ! Codeine Sulfate (Codeine Sulfate) 3)  ! Diovan (Valsartan) 4)  ! Motrin Ib (Ibuprofen) 5)  Lisinopril (Lisinopril) 6)  Verapamil 7)  Hydrochlorothiazide 8)  Atenolol  Past History:  Past Surgical History: Last updated: 03/27/2010 SIGMOID COLECTOMY 2006 Hysterectomy Total knee replacement L 2003 R rot cuff surg 08 Right Breast Biopsy Knee replacement artho Surgical Intervention for bilateral hydronephrosis Cataract Extraction -Bilateral Bilateral Foot Reconstruction Vericose Vein Stripping-lower extremities hammer toe surgery back surgery  Family History: Last updated: 03/27/2010 Family History Diabetes 1st degree relative: Family History Hypertension Family History of Heart Disease:  No FH of Colon Cancer: Family History of Prostate Cancer: father, maternal uncle Family History of Breast Cancer: daughter  Social History: Last updated: 06/14/2009 Retired Married Never Smoked Alcohol Use - no Illicit Drug Use - no  Past Medical History: Colonic polyps, hx of Diabetes mellitus, type II Diverticulitis,  hx of Gout Hypertension Osteoarthritis Hyperlipidemia Pulmonary HTN L hydronephrosis L adrenal adenoma 2006 GERD w/esophaageal spasm 2011 Boils 2009 Cerebrovascular accident, cerebellar 2010 Low back pain cholelithiasis asympt. with a nl HIDA 4/11 Stress  Review of Systems  The patient denies fever, chest pain, hemoptysis, and melena.    Physical Exam  General:  NAD, looks tired, alert, oriented and very pleasant. Nose:  External nasal examination shows no deformity or inflammation. Nasal mucosa are pink and moist without lesions or exudates. Mouth:  coated tongue, dryish Neck:  Supple; no masses or  thyromegaly. Lungs:  Normal respiratory effort, chest expands symmetrically. Lungs are clear to auscultation, no crackles or wheezes. Heart:  clear breath sounds. Normal S1, normal S2. Abdomen:  obese soft abdomen without tenderness. Well-healed surgical scars in the lower abdomen. Liver edge at costal margin. Msk:  R hip troch major was tender Extremities:  No edema Neurologic:  No cranial nerve deficits noted. Station and gait are normal. Plantar reflexes are down-going bilaterally. DTRs are symmetrical throughout. Sensory, motor and coordinative functions appear intact. Steady gait Skin:  Clear Cervical Nodes:  R posterior LN tender.   Psych:  Oriented X3.     Impression & Recommendations:  Problem # 1:  MEMORY LOSS (ICD-780.93) due to stress vs other Assessment New Options to treat were discuseed  Problem # 2:  HIP PAIN (ICD-719.45) R MSK Assessment: Deteriorated Pennsaid Stretch Inject if not better Her updated medication list for this problem includes:    Aspirin 81 Mg Tbec (Aspirin) ..... One by mouth every day    Ibuprofen 600 Mg Tabs (Ibuprofen) .Marland Kitchen... 1 by mouth bid  pc x 1 wk then as needed for  pain  Problem # 3:  HYPERTENSION (ICD-401.9) Assessment: Improved  Her updated medication list for this problem includes:    Torsemide 20 Mg Tabs (Torsemide) .Marland Kitchen... Take 1 by mouth qd    Coreg 25 Mg Tabs (Carvedilol) .Marland Kitchen... 1 by mouth two times a day    Amlodipine Besylate 5 Mg Tabs (Amlodipine besylate) .Marland Kitchen... 1 by mouth qd    Spironolactone 50 Mg Tabs (Spironolactone) .Marland Kitchen... 1 by mouth qam for blood pressure  Problem # 4:  DIABETES MELLITUS, TYPE II (ICD-250.00) Assessment: Unchanged  Her updated medication list for this problem includes:    Metformin Hcl 500 Mg Tabs (Metformin hcl) .Marland Kitchen... Take 1 tab twice daily    Aspirin 81 Mg Tbec (Aspirin) ..... One by mouth every day  Labs Reviewed: Creat: 1.4 (07/06/2010)    Reviewed HgBA1c results: 7.2 (07/06/2010)  7.4  (04/06/2010)  Complete Medication List: 1)  Torsemide 20 Mg Tabs (Torsemide) .... Take 1 by mouth qd 2)  Allopurinol 300 Mg Tabs (Allopurinol) .Marland Kitchen.. 1 once daily 3)  Amitriptyline Hcl 25 Mg Tabs (Amitriptyline hcl) .Marland Kitchen.. 1 once daily at hs prn 4)  Triamcinolone Acetonide 0.5 % Crea (Triamcinolone acetonide) .... Two times a day prn 5)  Vitamin D3 2000 Unit Caps (Cholecalciferol) .... Take one by mouth once daily 6)  Doxycycline Hyclate 100 Mg Caps (Doxycycline hyclate) .... Take 1 tab twice a day x 7 d prn 7)  Metformin Hcl 500 Mg Tabs (Metformin hcl) .... Take 1 tab twice daily 8)  Bactroban 2 % Oint (Mupirocin) .... Use qid prn 9)  Coreg 25 Mg Tabs (Carvedilol) .Marland Kitchen.. 1 by mouth two times a day 10)  Eye Drops  .... Once daily 11)  Amlodipine Besylate 5 Mg Tabs (Amlodipine besylate) .Marland Kitchen.. 1 by mouth qd 12)  Benzonatate  200 Mg Caps (Benzonatate) .Marland Kitchen.. 1 by mouth three times a day as needed cough 13)  Terazol 7 0.4 % Crea (Terconazole) .... Use pv x 7 d as needed hs 14)  Aspirin 81 Mg Tbec (Aspirin) .... One by mouth every day 15)  Spironolactone 50 Mg Tabs (Spironolactone) .Marland Kitchen.. 1 by mouth qam for blood pressure 16)  Aloe Vera  .... Take one by mouth once daily 17)  Levsin/sl 0.125 Mg Subl (Hyoscyamine sulfate) .... Take 1 under tongue every 4-6 hours as needed for pain and spasms 18)  Pantoprazole Sodium 40 Mg Tbec (Pantoprazole sodium) .... Take 1 tablet by mouth two times a day 19)  Pennsaid 1.5 % Soln (Diclofenac sodium) .... 3-5 gtt on skin three times a day for pain 20)  Ibuprofen 600 Mg Tabs (Ibuprofen) .Marland Kitchen.. 1 by mouth bid  pc x 1 wk then as needed for  pain  Patient Instructions: 1)  Please schedule a follow-up appointment in 3 months .  2)  Come back sooner if you would like me to inject your hip that hurts. 3)  BMP prior to visit, ICD-9: 250.00 4)  HgBA1c prior to visit  ICD-9:  5)  Vit B12 782.0 Prescriptions: IBUPROFEN 600 MG TABS (IBUPROFEN) 1 by mouth bid  pc x 1 wk then as  needed for  pain  #60 x 3   Entered and Authorized by:   Tresa Garter MD   Signed by:   Tresa Garter MD on 07/10/2010   Method used:   Print then Give to Patient   RxID:   [id number] PENNSAID 1.5 % SOLN (DICLOFENAC SODIUM) 3-5 gtt on skin three times a day for pain  #1 x 3   Entered and Authorized by:   Tresa Garter MD   Signed by:   Tresa Garter MD on 07/10/2010   Method used:   Print then Give to Patient   RxID:   [id number]

## 2011-01-23 NOTE — Letter (Signed)
[facility]: [facility]   Imported By: Lester [place] 01/11/2010 08:36:12  _____________________________________________________________________  External Attachment:    Type:   Image     Comment:   External Document

## 2011-01-23 NOTE — Progress Notes (Signed)
Summary: [facility]  Phone Note Refill Request   Refills Requested: Medication #1:  PROTONIX 40 MG SOLR Take 1 tablet by mouth once a day. PHARMACY-please d/c omeprazole rx! Patient is requesting rx to go to [facility]  Initial call taken by: Lamar Sprinkles,  July 19, 2009 12:31 PM  Follow-up for Phone Call        done escript Follow-up by: Corwin Levins MD,  July 19, 2009 1:30 PM  Additional Follow-up for Phone Call Additional follow up Details #1::        rec flag that it did not go through per EMR. so resent as fax Additional Follow-up by: Windell Norfolk,  July 19, 2009 1:42 PM    Additional Follow-up for Phone Call Additional follow up Details #2::    Pt informed of rx Follow-up by: Lamar Sprinkles,  July 19, 2009 5:07 PM  New/Updated Medications: PROTONIX 40 MG SOLR (PANTOPRAZOLE SODIUM) Take 1 tablet by mouth once a day Prescriptions: PROTONIX 40 MG SOLR (PANTOPRAZOLE SODIUM) Take 1 tablet by mouth once a day  #90 x 3   Entered by:   Windell Norfolk   Authorized by:   Corwin Levins MD   Signed by:   Windell Norfolk on 07/19/2009   Method used:   Faxed to ...       [facility]* (mail-order)             ,          Ph: [phone]       Fax: [phone]   RxID:   [id number] PROTONIX 40 MG SOLR (PANTOPRAZOLE SODIUM) Take 1 tablet by mouth once a day  #90 x 3   Entered and Authorized by:   Corwin Levins MD   Signed by:   Corwin Levins MD on 07/19/2009   Method used:   Electronically to        [facility]* (mail-order)             ,          Ph: [phone]       Fax: [phone]   RxID:   [id number]

## 2011-01-23 NOTE — Progress Notes (Signed)
Summary: GI Appt.  Phone Note Outgoing Call   Call placed by: Dagoberto Reef,  May 16, 2009 10:18 AM Summary of Call: Dr Ramon Dredge had referred this pt to Dr Juanda Chance. Jatavia Keltner is a pt of Dr Christella Hartigan -appt 06/15/09 @ 9:15am with Dr Christella Hartigan is this ok?  Initial call taken by: Dagoberto Reef,  May 16, 2009 10:21 AM  Follow-up for Phone Call        Sorry.Sure it is  OK Dr Christella Hartigan ( She said Dr Juanda Chance, so I got confused.... ). Sorry. Follow-up by: Tresa Garter MD,  May 16, 2009 5:10 PM

## 2011-01-23 NOTE — Progress Notes (Signed)
Summary: BP MED PROBLEM  Phone Note Call from Patient   Summary of Call: Pt says bp med, verapamil given yesterday makes her feel out of breath.  Initial call taken by: Lamar Sprinkles,  February 17, 2009 10:15 AM  Follow-up for Phone Call        Change to Amlodipine 5 mg/d pls Follow-up by: Tresa Garter MD,  February 17, 2009 1:03 PM  Additional Follow-up for Phone Call Additional follow up Details #1::        left mess to call office back ..........Marland KitchenLamar Sprinkles  February 17, 2009 1:50 PM     Additional Follow-up for Phone Call Additional follow up Details #2::    Pt informed  Follow-up by: Lamar Sprinkles,  February 17, 2009 5:17 PM    Prescriptions: AMLODIPINE BESYLATE 5 MG TABS (AMLODIPINE BESYLATE) 1 by mouth qd  #30 x 12   Entered by:   Lamar Sprinkles   Authorized by:   Tresa Garter MD   Signed by:   Lamar Sprinkles on 02/17/2009   Method used:   [facility]  [address] [phone]* (retail)       [address]       [place], [place]  [address]       Ph: [phone] or [phone]       Fax: [phone]   RxID:   [id number]

## 2011-01-23 NOTE — Progress Notes (Signed)
Summary: triage   Phone Note Call from Patient Call back at Home Phone [phone]   Caller: Patient Call For: Juanda Chance Reason for Call: Talk to Nurse Summary of Call: Patient was at [facility] yesterday and was told to call us for an appt with Dr Juanda Chance for asap do to gallstones Initial call taken by: Tawni Levy,  March 27, 2010 9:39 AM  Follow-up for Phone Call        Pt. went to [facility] ER for chest pains, found a gallstone, wants to see Dr.Archimedes Harold ASAP.  Pt. continues with pain today, getting worse. Pt. will see [facility] today at 2pm. Follow-up by: Laureen Ochs LPN,  March 27, 2010 10:34 AM

## 2011-01-23 NOTE — Assessment & Plan Note (Signed)
Summary: POST ER VISIT/CONTINUES WITH CHEST PAIN   (DR.BRODIE PT.)  DE...    History of Present Illness Visit Type: Follow-up Visit Primary GI MD: Lina Sar MD Primary Provider: Sonda Primes, MD Requesting Provider: na Chief Complaint: Patient following up from an ER visit.  Patient was dx with gallstones last night and given a GI cocktail. She states thats she felt much better after the GI cocktail. She was given zofran and percocet but never filled them. Patient would like to know about increasing her Protonix to twice a day. History of Present Illness:   77 Y.O FEMALE KNOWN TO DR. Juanda Chance WITH HX OF GERD,PROBABLE ESOPHAGEAL SPASM WHO COMES IN TODAY AFTER ER VISIT YESTERDAY WITH SUBXYPHOID CHEST PAIN WHICH SHE DESCRIBES AS BURNING FROM HER LOWER CHEST ALL THE WAY UP IN TO HER RIGHT EAR. SHE IS ALSO HAVING JABBING DISCOMFORT  BETWEEN HER SHOULDER BLADES. NO FEVER, NO NAUSEA, VOMITING, NO DYSPHAGIA/ODYNOPHAGIA. THIS IS SIMILAR TO SXS SHE HAS HAD IN THE PAST ONLY WORSE. SHE TAKES PROTONIX IN THE MORNINGS AND USUALLY HAS INCREASED SXS AS THE DAY GOES ON.  EGD LAST SUMMER, NO STRICTURE,SUBSEQUENT BARIUM SWALLOW SHOWED MILD TERTIARY CONTRACTIONS. EVAL IN THE ER-LABS UNREMARKABLE WITH CBC,CMET,LIPASE AND CARDIAC MARKERS. ABDOMINAL US SHOWS ONE GALLSRONE, NO WALL THICKENING OR DUCTAL DILATION.   GI Review of Systems    Reports abdominal pain, acid reflux, belching, chest pain, and  heartburn.     Location of  Abdominal pain: epigastric area.    Denies bloating, dysphagia with liquids, dysphagia with solids, loss of appetite, nausea, vomiting, vomiting blood, and  weight loss.        Denies anal fissure, black tarry stools, change in bowel habit, constipation, diarrhea, diverticulosis, fecal incontinence, heme positive stool, hemorrhoids, irritable bowel syndrome, jaundice, light color stool, liver problems, rectal bleeding, and  rectal pain.    Current Medications (verified): 1)  Torsemide 20  Mg  Tabs (Torsemide) .... Take 1 By Mouth Qd 2)  Allopurinol 300 Mg  Tabs (Allopurinol) .Marland Kitchen.. 1 Once Daily 3)  Amitriptyline Hcl 25 Mg  Tabs (Amitriptyline Hcl) .Marland Kitchen.. 1 Once Daily At [facility] 4)  Triamcinolone Acetonide 0.5 % Crea (Triamcinolone Acetonide) .... Bid 5)  Protonix 40 Mg Solr (Pantoprazole Sodium) .... Take 1 Tablet By Mouth Once A Day 6)  Vitamin D3 2000 Unit Caps (Cholecalciferol) .... Take One By Mouth Once Daily 7)  Doxycycline Hyclate 100 Mg Caps (Doxycycline Hyclate) .... Take 1 Tab Twice A Day X 7 D Prn 8)  Metformin Hcl 500 Mg Tabs (Metformin Hcl) .... Take 1 Tab Twice Daily 9)  Bactroban 2 %  Oint (Mupirocin) .... Use Qid 10)  Coreg 25 Mg Tabs (Carvedilol) .Marland Kitchen.. 1 By Mouth Two Times A Day 11)  Eye Drops .... Once Daily 12)  Amlodipine Besylate 5 Mg Tabs (Amlodipine Besylate) .Marland Kitchen.. 1 By Mouth Qd 13)  Benzonatate 200 Mg Caps (Benzonatate) .Marland Kitchen.. 1 By Mouth Three Times A Day As Needed Cough 14)  Terazol 7 0.4 % Crea (Terconazole) .... Use Pv X 7 D As Needed Hs 15)  Aspirin 81 Mg  Tbec (Aspirin) .... One By Mouth Every Day 16)  Spironolactone 50 Mg Tabs (Spironolactone) .Marland Kitchen.. 1 By Mouth Qam For Blood Pressure 17)  Aloe Vera .... Take One By Mouth Once Daily  Allergies (verified): 1)  ! Aspirin Ec (Aspirin) 2)  ! Codeine Sulfate (Codeine Sulfate) 3)  ! Diovan (Valsartan) 4)  ! Motrin Ib (Ibuprofen) 5)  Lisinopril (Lisinopril) 6)  Verapamil  7)  Hydrochlorothiazide 8)  Atenolol  Past History:  Past Medical History: Colonic polyps, hx of Diabetes mellitus, type II Diverticulitis, hx of Gout Hypertension Osteoarthritis Hyperlipidemia Pulmonary HTN L hydronephrosis L adrenal adenoma 2006 GERD Boils 2009 Cerebrovascular accident, cerebellar 2010 Low back pain cholelithiasis  Past Surgical History: SIGMOID COLECTOMY 2006 Hysterectomy Total knee replacement L 2003 R rot cuff surg 08 Right Breast Biopsy Knee replacement artho Surgical Intervention for bilateral  hydronephrosis Cataract Extraction -Bilateral Bilateral Foot Reconstruction Vericose Vein Stripping-lower extremities hammer toe surgery back surgery  Family History: Family History Diabetes 1st degree relative: Family History Hypertension Family History of Heart Disease:  No FH of Colon Cancer: Family History of Prostate Cancer: father, maternal uncle Family History of Breast Cancer: daughter  Social History: Reviewed history from 06/14/2009 and no changes required. Retired Married Never Smoked Alcohol Use - no Illicit Drug Use - no  Review of Systems       The patient complains of anxiety-new, arthritis/joint pain, back pain, change in vision, fatigue, headaches-new, sleeping problems, thirst - excessive, and urine leakage.  The patient denies allergy/sinus, anemia, blood in urine, breast changes/lumps, confusion, cough, coughing up blood, depression-new, fainting, fever, hearing problems, heart murmur, heart rhythm changes, itching, menstrual pain, muscle pains/cramps, night sweats, nosebleeds, pregnancy symptoms, shortness of breath, skin rash, sore throat, swelling of feet/legs, swollen lymph glands, thirst - excessive , urination - excessive , urination changes/pain, vision changes, and voice change.         ROS OTHERWISE AS IN HPI  Vital Signs:  Patient profile:   77 year old female Height:      64 inches Weight:      192.6 pounds BMI:     33.18 Pulse rate:   60 / minute Pulse rhythm:   regular BP sitting:   170 / 80  (left arm) Cuff size:   regular  Vitals Entered By: Harlow Mares CMA Duncan Dull) (March 27, 2010 2:19 PM)  Physical Exam  General:  Well developed, well nourished, no acute distress. Head:  Normocephalic and atraumatic. Eyes:  PERRLA, no icterus. Neck:  Supple; no masses or thyromegaly. Chest Wall:  NO CHEST WALL TENDERNESS Lungs:  Clear throughout to auscultation. Heart:  Regular rate and rhythm; no murmurs, rubs,  or bruits. Abdomen:  SOFT,  NONTENDER, NO MASS OR HSM,BS+ Rectal:  NOT DONE   Impression & Recommendations:  Problem # 1:  CHEST PAIN UNSPECIFIED (ICD-786.50) Assessment Deteriorated 78 YO FEMALE WITH NONEXERTIONAL SUBXYPHOID PAIN RADIATING TO RIGHT SHOULDER AND BACK. CHOLELITHIASIS DOCUMENTED WITH RECENT ER VISIT BUT NOT CONVINCED HER SXS ARE SECONDARY TO BILIARY COLIC-SUSPECT ESOPHAGEAL;ESOPHAGEAL SPASM,REFLUX ESOPHAGITIS.  TRIAL OF NEXIUM 40 MG TWICE DAILY- X 2 WEEKS-SAMPLES GIVEN -IF HELPFUL WILL NEED RX. LEVSIN SL.Q 4-6 HOURS as needed SPASMS/PAIN REPEAT CBC,HEPATIC PANEL TODAY SCHEDULE HIDA SCAN FOR TOMORROW. FOLLOW UP WITH DR, BRODIE IN 3 WEEKS. Orders: TLB-Hepatic/Liver Function Pnl (80076-HEPATIC) TLB-CBC Platelet - w/Differential (85025-CBCD)  Problem # 2:  CHOLELITHIASIS (ICD-574.2) Assessment: Comment Only SEE ABOVE  Problem # 3:  CVA-STROKE (ICD-434.91) Assessment: Comment Only NO DEFICITS  Problem # 4:  DIVERTICULOSIS-COLON (ICD-562.10) Assessment: Comment Only  Other Orders: HIDA Scan (HIDA SCAN)  Patient Instructions: 1)  Your physician has requested that you have the following labwork done today: Please go to our basement level. 2)  Please go to [facility] Tues 03-28-10.  3)  Be there at 1:30 PM. 4)  Have nothing to eat or drink past 8:00 AM.  You  may have breakfast prior to that. 5)  We sent a perscription for Levsin to your pharamacy. 6)  We also are giving you Nexium samples to take twice daily.  If helping you will need a perscription. 7)  Copy sent to :  Sonda Primes, MD 8)  The medication list was reviewed and reconciled.  All changed / newly prescribed medications were explained.  A complete medication list was provided to the patient / caregiver. Prescriptions: LEVSIN/SL 0.125 MG SUBL (HYOSCYAMINE SULFATE) Take 1 under tongue every 4-6 hours as needed for pain and spasms  #90 x 1   Entered by:   Lowry Ram NCMA   Authorized by:   Sammuel Cooper PA-c    Signed by:   Lowry Ram NCMA on 03/27/2010   Method used:   [facility]ron[facility] to        [facility]  [address] [phone]* (retail)       [address]       [place], [place]  [id number]       Ph: [phone] or [phone]       Fax: [phone]   RxID:   [id number]

## 2011-01-23 NOTE — Progress Notes (Signed)
Summary: Rx refill--[facility]  Phone Note Refill Request Message from:  Patient on March 15, 2010 11:27 AM  Refills Requested: Medication #1:  METFORMIN HCL 500 MG TABS Take 1 tab twice daily  Medication #2:  COREG 25 MG TABS 1 by mouth two times a day Per patient these need to go to [facility].  Initial call taken by: Lucious Groves,  March 15, 2010 11:28 AM    Prescriptions: COREG 25 MG TABS (CARVEDILOL) 1 by mouth two times a day  #90 x 3   Entered by:   Lucious Groves   Authorized by:   Tresa Garter MD   Signed by:   Lucious Groves on 03/15/2010   Method used:   Faxed to ...       [facility]* (mail-order)             ,          Ph: [phone]       Fax: [phone]   RxID:   [id number] METFORMIN HCL 500 MG TABS (METFORMIN HCL) Take 1 tab twice daily  #180 x 3   Entered by:   Lucious Groves   Authorized by:   Tresa Garter MD   Signed by:   Lucious Groves on 03/15/2010   Method used:   Faxed to ...       [facility]* (mail-order)             ,          Ph: [phone]       Fax: [phone]   RxID:   [id number]

## 2011-01-23 NOTE — Letter (Signed)
Summary: Patient [facility] Biopsy Results  [facility]  [address] [place], [place] [address]   Phone: [phone]  Fax: [phone]        July 13, 2009 MRN: [id number]    [facility] [address] [place], [place]  [address]    Dear Ms. Eisenhuth,  I am pleased to inform you that the biopsies taken during your recent endoscopic examination did not show any evidence of cancer upon pathologic examination.The biopsy of the esophagus show mild inflammation due to reflux  Additional information/recommendations:  __No further action is needed at this time.  Please follow-up with      your primary care physician for your other healthcare needs.  __ Please call [phone] to schedule a return visit to review      your condition.  _x_ Continue with the treatment plan as outlined on the day of your      exam.  __ You should have a repeat endoscopic examination for this problem              in _ months/years.   Please call us if you are having persistent problems or have questions about your condition that have not been fully answered at this time.  Sincerely,  Hart Carwin MD  This letter has been electronically signed by your physician.

## 2011-01-23 NOTE — Letter (Signed)
Summary: Detailed Written Physician order for Home Blood Glucose Testing   Detailed Written Physician order for Home Blood Glucose Testing and Diabetes Treatment Items   Imported By: Maryln Gottron 12/23/2007 11:46:40  _____________________________________________________________________  External Attachment:    Type:   Image     Comment:   External Document

## 2011-01-23 NOTE — Progress Notes (Signed)
Summary: LEG PAIN   Phone Note Call from Patient Call back at Home Phone [phone]   Summary of Call: Pt c/o swelling and pain in her left leg. Swelling is from below knee to above ankle. Pain became worse Friday and area is sore to touch.  Initial call taken by: Lamar Sprinkles, CMA,  October 02, 2010 12:10 PM  Follow-up for Phone Call        office visit w/avail md per PCP, pt scheduled for apt today Follow-up by: Lamar Sprinkles, CMA,  October 02, 2010 12:38 PM  Additional Follow-up for Phone Call Additional follow up Details #1::        Thank you!  Additional Follow-up by: Tresa Garter MD,  October 02, 2010 1:16 PM

## 2011-01-23 NOTE — Progress Notes (Signed)
  Phone Note Outgoing Call   Call placed to: Patient Summary of Call: LA;  please let her know that her blood clot test was slightly elevated so I have ordered an ultrasound test of her legs  TJ Initial call taken by: Etta Grandchild MD,  October 03, 2010 7:35 AM  Follow-up for Phone Call        Patient notified  per MD and advised that [facility] will contact her with appt info. Follow-up by: Rock Nephew CMA,  October 03, 2010 8:15 AM  Additional Follow-up for Phone Call Additional follow up Details #1::        Pt was seen in the office and had doppler pt Dr Plot's office visit notes.  Additional Follow-up by: Lamar Sprinkles, CMA,  October 21, 2010 12:18 PM

## 2011-01-23 NOTE — Progress Notes (Signed)
Summary: Med refill  Phone Note Refill Request Message from:  Patient on April 10, 2010 8:48 AM  Refills Requested: Medication #1:  TORSEMIDE 20 MG  TABS TAKE 1 by mouth QD  Medication #2:  ALLOPURINOL 300 MG  TABS 1 once daily  Medication #3:  PROTONIX 40 MG SOLR Take 1 tablet by mouth once a day  Medication #4:  SPIRONOLACTONE 50 MG TABS 1 by mouth qam for blood pressure Initial call taken by: Lucious Groves,  April 10, 2010 8:48 AM    Prescriptions: SPIRONOLACTONE 50 MG TABS (SPIRONOLACTONE) 1 by mouth qam for blood pressure  #90 x 3   Entered by:   Lucious Groves   Authorized by:   Tresa Garter MD   Signed by:   Lucious Groves on 04/10/2010   Method used:   Faxed to ...       [facility]* (mail-order)             ,          Ph: [phone]       Fax: [phone]   RxID:   [id number] PROTONIX 40 MG SOLR (PANTOPRAZOLE SODIUM) Take 1 tablet by mouth once a day  #90 x 3   Entered by:   Lucious Groves   Authorized by:   Tresa Garter MD   Signed by:   Lucious Groves on 04/10/2010   Method used:   Faxed to ...       [facility] [employer]* (mail-order)             ,          Ph: [phone]       Fax: [phone]   RxID:   [id number] ALLOPURINOL 300 MG  TABS (ALLOPURINOL) 1 once daily  #90 x 3   Entered by:   Lucious Groves   Authorized by:   Tresa Garter MD   Signed by:   Lucious Groves on 04/10/2010   Method used:   Faxed to ...       [facility]* (mail-order)             ,          Ph: [phone]       Fax: [phone]   RxID:   [id number] TORSEMIDE 20 MG  TABS (TORSEMIDE) TAKE 1 by mouth QD  #90 x 3   Entered by:   Lucious Groves   Authorized by:   Tresa Garter MD   Signed by:   Lucious Groves on 04/10/2010   Method used:   Faxed to ...       [facility]* (mail-order)             ,          Ph: [phone]       Fax: [phone]   RxID:   [id number]

## 2011-01-23 NOTE — Miscellaneous (Signed)
Summary: RX nexium  Clinical Lists Changes  Medications: Added new medication of NEXIUM 40 MG  CPDR (ESOMEPRAZOLE MAGNESIUM) 1 capsule twice a day 30 minutes before meals - Signed Rx of NEXIUM 40 MG  CPDR (ESOMEPRAZOLE MAGNESIUM) 1 capsule twice a day 30 minutes before meals;  #60 x 3;  Signed;  Entered by: Sherren Kerns RN;  Authorized by: Hart Carwin MD;  Method used: Electronically to [facility]*, , ,   , Ph: [phone], Fax: [phone] Observations: Added new observation of ALLERGY REV: Done (04/28/2010 14:53)    Prescriptions: NEXIUM 40 MG  CPDR (ESOMEPRAZOLE MAGNESIUM) 1 capsule twice a day 30 minutes before meals  #60 x 3   Entered by:   Sherren Kerns RN   Authorized by:   Hart Carwin MD   Signed by:   Sherren Kerns RN on 04/28/2010   Method used:   Electronically to        [facility]* (mail-order)             ,          Ph: [phone]       Fax: [phone]   RxID:   [id number]

## 2011-01-23 NOTE — Assessment & Plan Note (Signed)
Summary: discuss bp/$50/cd   Vital Signs:  Patient Profile:   77 Years Old Female Height:     63 inches Weight:      201 pounds Temp:     98.2 degrees F oral Pulse rate:   48 / minute BP sitting:   140 / 80  (left arm)  Vitals Entered By: Tora Perches (January 06, 2009 8:47 AM)             Is Patient Diabetic? Yes     Chief Complaint:  Multiple medical problems or concerns.  History of Present Illness: The patient presents for a follow up of hypertension, diabetes, hyperlipidemia Had HA: Atenonolol and had problems with it: SOB    Prior Medications Reviewed Using: Patient Recall  Prior Medication List:  TORSEMIDE 20 MG  TABS (TORSEMIDE) TAKE 1 by mouth QD ALLOPURINOL 300 MG  TABS (ALLOPURINOL) once daily AMITRIPTYLINE HCL 25 MG  TABS (AMITRIPTYLINE HCL) once daily DETROL LA 2 MG  CP24 (TOLTERODINE TARTRATE) once daily TRIAMCINOLONE ACETONIDE 0.5 % CREA (TRIAMCINOLONE ACETONIDE) bid OMEPRAZOLE 20 MG CPDR (OMEPRAZOLE) Take 2 tabs daily VITAMIN D3 1000 UNIT  TABS (CHOLECALCIFEROL) 1 by mouth daily DOXYCYCLINE HYCLATE 100 MG CAPS (DOXYCYCLINE HYCLATE) Take 1 tab twice a day x 7 d prn METFORMIN HCL 500 MG TABS (METFORMIN HCL) Take 1 tab twice daily BACTROBAN 2 %  OINT (MUPIROCIN) use qid HIBICLENS 4 % LIQD (CHLORHEXIDINE GLUCONATE) use qd VERAPAMIL HCL CR 240 MG TBCR (VERAPAMIL HCL) 1 by mouth daily ATENOLOL 50 MG TABS (ATENOLOL) 1/2 once daily * EYE DROPS once daily   Current Allergies (reviewed today): ! ASPIRIN EC (ASPIRIN) ! CODEINE SULFATE (CODEINE SULFATE) ! DIOVAN (VALSARTAN) ! MOTRIN IB (IBUPROFEN) LISINOPRIL (LISINOPRIL) VERAPAMIL  Past Medical History:    Reviewed history from 02/06/2008 and no changes required:       Colonic polyps, hx of       Diabetes mellitus, type II       Diverticulitis, hx of       Gout       Hypertension       Osteoarthritis       Hyperlipidemia       Pulmonary HTN       L hydronephrosis       L adrenal adenoma 2006    GERD       Boils 2009   Family History:    Reviewed history from 11/06/2007 and no changes required:       Family History Diabetes 1st degree relative       Family History Hypertension  Social History:    Reviewed history from 11/06/2007 and no changes required:       Retired       Married       Never Smoked     Physical Exam  General:     NAD Ears:     External ear exam shows no significant lesions or deformities.  Otoscopic examination reveals clear canals, tympanic membranes are intact bilaterally without bulging, retraction, inflammation or discharge. Hearing is grossly normal bilaterally. Nose:     External nasal examination shows no deformity or inflammation. Nasal mucosa are pink and moist without lesions or exudates. Mouth:     Oral mucosa and oropharynx without lesions or exudates.  Teeth in good repair. Lungs:     Normal respiratory effort, chest expands symmetrically. Lungs are clear to auscultation, no crackles or wheezes. Heart:     Normal rate and regular rhythm. S1  and S2 normal without gallop, murmur, click, rub or other extra sounds. Msk:     No deformity or scoliosis noted of thoracic or lumbar spine.   Pulses:     R and L carotid,radial,femoral,dorsalis pedis and posterior tibial pulses are full and equal bilaterally Neurologic:     No cranial nerve deficits noted. Station and gait are normal. Plantar reflexes are down-going bilaterally. DTRs are symmetrical throughout. Sensory, motor and coordinative functions appear intact. Skin:     8x7 mm furuncle on L pubic area, no fluctuation or d/c Psych:     Oriented X3.      Impression & Recommendations:  Problem # 1:  HEADACHE (ICD-784.0) Assessment: Improved  The following medications were removed from the medication list:    Atenolol 50 Mg Tabs (Atenolol) .Marland Kitchen... 1/2 once daily  Her updated medication list for this problem includes:    Coreg 25 Mg Tabs (Carvedilol) .Marland Kitchen... 1/2 by mouth two times a  day x 1 wk, then 1 by mouth two times a day if tol   Problem # 2:  DYSPNEA (ICD-786.05) due to Atenolol Assessment: Improved  Problem # 3:  HYPERTENSION (ICD-401.9) Assessment: Improved  The following medications were removed from the medication list:    Atenolol 50 Mg Tabs (Atenolol) .Marland Kitchen... 1/2 once daily  Her updated medication list for this problem includes:    Torsemide 20 Mg Tabs (Torsemide) .Marland Kitchen... Take 1 by mouth qd    Verapamil Hcl Cr 240 Mg Tbcr (Verapamil hcl) .Marland Kitchen... 1 by mouth daily    Coreg 12.5 Mg Tabs (Carvedilol) .Marland Kitchen... 1 by mouth two times a day  NEW  The following medications were removed from the medication list:    Verapamil Hcl Cr 240 Mg Tbcr (Verapamil hcl) .Marland Kitchen... 1 by mouth daily    Atenolol 50 Mg Tabs (Atenolol) .Marland Kitchen... 1/2 once daily  Her updated medication list for this problem includes:    Torsemide 20 Mg Tabs (Torsemide) .Marland Kitchen... Take 1 by mouth qd    Coreg 25 Mg Tabs (Carvedilol) .Marland Kitchen... 1/2 by mouth two times a day x 1 wk, then 1 by mouth two times a day if tol   Problem # 4:  DIABETES MELLITUS, TYPE II (ICD-250.00) Assessment: Comment Only  Her updated medication list for this problem includes:    Metformin Hcl 500 Mg Tabs (Metformin hcl) .Marland Kitchen... Take 1 tab twice daily   Problem # 5:  GOUT (ICD-274.9) Assessment: Comment Only  Her updated medication list for this problem includes:    Allopurinol 300 Mg Tabs (Allopurinol) ..... Once daily   Complete Medication List: 1)  Torsemide 20 Mg Tabs (Torsemide) .... Take 1 by mouth qd 2)  Allopurinol 300 Mg Tabs (Allopurinol) .... Once daily 3)  Amitriptyline Hcl 25 Mg Tabs (Amitriptyline hcl) .... Once daily 4)  Detrol La 2 Mg Cp24 (Tolterodine tartrate) .... Once daily 5)  Triamcinolone Acetonide 0.5 % Crea (Triamcinolone acetonide) .... Bid 6)  Omeprazole 20 Mg Cpdr (Omeprazole) .... Take 2 tabs daily 7)  Vitamin D3 1000 Unit Tabs (Cholecalciferol) .Marland Kitchen.. 1 by mouth daily 8)  Doxycycline Hyclate 100 Mg Caps  (Doxycycline hyclate) .... Take 1 tab twice a day x 7 d prn 9)  Metformin Hcl 500 Mg Tabs (Metformin hcl) .... Take 1 tab twice daily 10)  Bactroban 2 % Oint (Mupirocin) .... Use qid 11)  Hibiclens 4 % Liqd (Chlorhexidine gluconate) .... Use qd 12)  Coreg 25 Mg Tabs (Carvedilol) .... 1/2 by mouth two times a  day x 1 wk, then 1 by mouth two times a day if tol 13)  Eye Drops  .... Once daily  Other Orders: H1N1 vaccine G code (N8295)   Patient Instructions: 1)  Keep ROV                      2)   Stop Atenolol and Verapamil and start Coreg as dirrected   Prescriptions: COREG 25 MG TABS (CARVEDILOL) 1/2 by mouth two times a day x 1 wk, then 1 by mouth two times a day if tol  #60 x 12   Entered and Authorized by:   Tresa Garter MD   Signed by:   Tresa Garter MD on 01/06/2009   Method used:   Print then Give to Patient   RxID:   [id number]    H1N1 # 1    Vaccine Type: H1N1 vaccine G code    Site: left deltoid    Mfr: novartis    Dose: 0.5 ml    Route: IM    Given by: Tora Perches    Exp. Date: 03/2009    Lot #: [id number]    VIS given: 09/23/2009 given January 06, 2009.

## 2011-01-23 NOTE — Medication Information (Signed)
Summary: Glucose & Diabetic supplies/[facility]  Glucose & Diabetic supplies/[facility]   Imported By: Lester Wadley 09/16/2008 09:12:06  _____________________________________________________________________  External Attachment:    Type:   Image     Comment:   External Document

## 2011-01-23 NOTE — Medication Information (Signed)
Summary: Diabetic Testing Supplies/[facility]  Diabetic Testing Supplies/[facility]   Imported By: Sherian Rein 03/07/2009 09:37:30  _____________________________________________________________________  External Attachment:    Type:   Image     Comment:   External Document

## 2011-01-23 NOTE — Letter (Signed)
Summary: Request for medical records/[facility]  Request for medical records/[facility]   Imported By: Sherian Rein 06/07/2010 11:10:59  _____________________________________________________________________  External Attachment:    Type:   Image     Comment:   External Document

## 2011-01-23 NOTE — Letter (Signed)
Summary: [facility]  [facility]   Imported By: Maryln Gottron 07/01/2009 15:53:05  _____________________________________________________________________  External Attachment:    Type:   Image     Comment:   External Document

## 2011-01-23 NOTE — Assessment & Plan Note (Signed)
Summary: follow up-lb   Vital Signs:  Patient profile:   77 year old female Height:      64 inches Weight:      191 pounds BMI:     32.90 O2 Sat:      97 % on Room air Temp:     97.6 degrees F oral Pulse rate:   65 / minute BP sitting:   134 / 68  (left arm) Cuff size:   large  Vitals Entered By: Lucious Groves (April 10, 2010 8:46 AM)  O2 Flow:  Room air CC: F/U ER viosiot from 03/26/10, per pt no symptoms or complaints./kb Is Patient Diabetic? No Pain Assessment Patient in pain? no      Comments Patient request med refills for [facility], done via phone note./kb   Primary Care Provider:  Sonda Primes, MD  CC:  F/U ER viosiot from 03/26/10 and per pt no symptoms or complaints./kb.  History of Present Illness: The patient presents for a follow up of hypertension, diabetes, hyperlipidemia F/u esoph spasm and abn [place] with a GS; HIDA was nl. No more pains.  Current Medications (verified): 1)  Torsemide 20 Mg  Tabs (Torsemide) .... Take 1 By Mouth Qd 2)  Allopurinol 300 Mg  Tabs (Allopurinol) .Marland Kitchen.. 1 Once Daily 3)  Amitriptyline Hcl 25 Mg  Tabs (Amitriptyline Hcl) .Marland Kitchen.. 1 Once Daily At [facility] 4)  Triamcinolone Acetonide 0.5 % Crea (Triamcinolone Acetonide) .... Bid 5)  Protonix 40 Mg Solr (Pantoprazole Sodium) .Marland Kitchen.. 1 By Mouth Bid 6)  Vitamin D3 2000 Unit Caps (Cholecalciferol) .... Take One By Mouth Once Daily 7)  Doxycycline Hyclate 100 Mg Caps (Doxycycline Hyclate) .... Take 1 Tab Twice A Day X 7 D Prn 8)  Metformin Hcl 500 Mg Tabs (Metformin Hcl) .... Take 1 Tab Twice Daily 9)  Bactroban 2 %  Oint (Mupirocin) .... Use Qid 10)  Coreg 25 Mg Tabs (Carvedilol) .Marland Kitchen.. 1 By Mouth Two Times A Day 11)  Eye Drops .... Once Daily 12)  Amlodipine Besylate 5 Mg Tabs (Amlodipine Besylate) .Marland Kitchen.. 1 By Mouth Qd 13)  Benzonatate 200 Mg Caps (Benzonatate) .Marland Kitchen.. 1 By Mouth Three Times A Day As Needed Cough 14)  Terazol 7 0.4 % Crea (Terconazole) .... Use Pv X 7 D As Needed Hs 15)  Aspirin 81 Mg  Tbec  (Aspirin) .... One By Mouth Every Day 16)  Spironolactone 50 Mg Tabs (Spironolactone) .Marland Kitchen.. 1 By Mouth Qam For Blood Pressure 17)  Aloe Vera .... Take One By Mouth Once Daily 18)  Levsin/sl 0.125 Mg Subl (Hyoscyamine Sulfate) .... Take 1 Under Tongue Every 4-6 Hours As Needed For Pain and Spasms  Allergies (verified): 1)  ! Aspirin Ec (Aspirin) 2)  ! Codeine Sulfate (Codeine Sulfate) 3)  ! Diovan (Valsartan) 4)  ! Motrin Ib (Ibuprofen) 5)  Lisinopril (Lisinopril) 6)  Verapamil 7)  Hydrochlorothiazide 8)  Atenolol  Past History:  Past Surgical History: Last updated: 03/27/2010 SIGMOID COLECTOMY 2006 Hysterectomy Total knee replacement L 2003 R rot cuff surg 08 Right Breast Biopsy Knee replacement artho Surgical Intervention for bilateral hydronephrosis Cataract Extraction -Bilateral Bilateral Foot Reconstruction Vericose Vein Stripping-lower extremities hammer toe surgery back surgery  Social History: Last updated: 06/14/2009 Retired Married Never Smoked Alcohol Use - no Illicit Drug Use - no  Past Medical History: Colonic polyps, hx of Diabetes mellitus, type II Diverticulitis, hx of Gout Hypertension Osteoarthritis Hyperlipidemia Pulmonary HTN L hydronephrosis L adrenal adenoma 2006 GERD w/esophaageal spasm 2011 Boils 2009 Cerebrovascular accident,  cerebellar 2010 Low back pain cholelithiasis asympt. with a nl HIDA 4/11  Social History: Reviewed history from 06/14/2009 and no changes required. Retired Married Never Smoked Alcohol Use - no Illicit Drug Use - no  Physical Exam  General:  alert, oriented and very pleasant. Eyes:  vision grossly intact.   Nose:  External nasal examination shows no deformity or inflammation. Nasal mucosa are pink and moist without lesions or exudates. Mouth:  no ulcers. Neck:  Supple; no masses or thyromegaly. Lungs:  Normal respiratory effort, chest expands symmetrically. Lungs are clear to auscultation, no crackles  or wheezes. Heart:  clear breath sounds. Normal S1, normal S2. Abdomen:  obese soft abdomen without tenderness. Well-healed surgical scars in the lower abdomen. Liver edge at costal margin. Msk:  R wrist is tender w/ROM w/some swelling, no crepitus Neurologic:  No cranial nerve deficits noted. Station and gait are normal. Plantar reflexes are down-going bilaterally. DTRs are symmetrical throughout. Sensory, motor and coordinative functions appear intact. Steady gait Skin:  8x7 mm furuncle on L pubic area, no fluctuation or d/c Psych:  Oriented X3.     Impression & Recommendations:  Problem # 1:  CHEST PAIN (ICD-786.50) due to #2 Assessment New Resolved  Problem # 2:  GERD (ICD-530.81)/esoph. spasm Assessment: Improved  The following medications were removed from the medication list:    Protonix 40 Mg Solr (Pantoprazole sodium) .Marland Kitchen... Take 1 tablet by mouth once a day    Nexium 40 Mg Cpdr (Esomeprazole magnesium) .Marland Kitchen... Take 1 tab twice daily , 30 min prior to breakfast and dinner Her updated medication list for this problem includes:    Levsin/sl 0.125 Mg Subl (Hyoscyamine sulfate) .Marland Kitchen... Take 1 under tongue every 4-6 hours as needed for pain and spasms    Nexium 40 Mg Cpdr (Esomeprazole magnesium) .Marland Kitchen... 1 by mouth qam ( medically necessary)  Problem # 3:  REFLUX ESOPHAGITIS (ICD-530.11) Assessment: Improved  The following medications were removed from the medication list:    Protonix 40 Mg Solr (Pantoprazole sodium) .Marland Kitchen... Take 1 tablet by mouth once a day    Nexium 40 Mg Cpdr (Esomeprazole magnesium) .Marland Kitchen... Take 1 tab twice daily , 30 min prior to breakfast and dinner Her updated medication list for this problem includes:    Levsin/sl 0.125 Mg Subl (Hyoscyamine sulfate) .Marland Kitchen... Take 1 under tongue every 4-6 hours as needed for pain and spasms    Nexium 40 Mg Cpdr (Esomeprazole magnesium) .Marland Kitchen... 1 by mouth qam ( medically necessary)  Problem # 4:  OSTEOARTHRITIS (ICD-715.90) Assessment:  Unchanged  Her updated medication list for this problem includes:    Aspirin 81 Mg Tbec (Aspirin) ..... One by mouth every day  Problem # 5:  BOILS, RECURRENT (ICD-680.9) Assessment: Improved Less frequent  Problem # 6:  HYPERTENSION (ICD-401.9) Assessment: Improved  Her updated medication list for this problem includes:    Torsemide 20 Mg Tabs (Torsemide) .Marland Kitchen... Take 1 by mouth qd    Coreg 25 Mg Tabs (Carvedilol) .Marland Kitchen... 1 by mouth two times a day    Amlodipine Besylate 5 Mg Tabs (Amlodipine besylate) .Marland Kitchen... 1 by mouth qd    Spironolactone 50 Mg Tabs (Spironolactone) .Marland Kitchen... 1 by mouth qam for blood pressure  BP today: 134/68 Prior BP: 170/80 (03/27/2010)  Labs Reviewed: K+: 4.7 (04/06/2010) Creat: : 1.3 (04/06/2010)   Chol: 157 (04/06/2010)   HDL: 52.10 (04/06/2010)   LDL: 90 (04/06/2010)   TG: 77.0 (04/06/2010)  Complete Medication List: 1)  Torsemide 20 Mg Tabs (  Torsemide) .... Take 1 by mouth qd 2)  Allopurinol 300 Mg Tabs (Allopurinol) .Marland Kitchen.. 1 once daily 3)  Amitriptyline Hcl 25 Mg Tabs (Amitriptyline hcl) .Marland Kitchen.. 1 once daily at hs 4)  Triamcinolone Acetonide 0.5 % Crea (Triamcinolone acetonide) .... Bid 5)  Vitamin D3 2000 Unit Caps (Cholecalciferol) .... Take one by mouth once daily 6)  Doxycycline Hyclate 100 Mg Caps (Doxycycline hyclate) .... Take 1 tab twice a day x 7 d prn 7)  Metformin Hcl 500 Mg Tabs (Metformin hcl) .... Take 1 tab twice daily 8)  Bactroban 2 % Oint (Mupirocin) .... Use qid 9)  Coreg 25 Mg Tabs (Carvedilol) .Marland Kitchen.. 1 by mouth two times a day 10)  Eye Drops  .... Once daily 11)  Amlodipine Besylate 5 Mg Tabs (Amlodipine besylate) .Marland Kitchen.. 1 by mouth qd 12)  Benzonatate 200 Mg Caps (Benzonatate) .Marland Kitchen.. 1 by mouth three times a day as needed cough 13)  Terazol 7 0.4 % Crea (Terconazole) .... Use pv x 7 d as needed hs 14)  Aspirin 81 Mg Tbec (Aspirin) .... One by mouth every day 15)  Spironolactone 50 Mg Tabs (Spironolactone) .Marland Kitchen.. 1 by mouth qam for blood  pressure 16)  Aloe Vera  .... Take one by mouth once daily 17)  Levsin/sl 0.125 Mg Subl (Hyoscyamine sulfate) .... Take 1 under tongue every 4-6 hours as needed for pain and spasms 18)  Nexium 40 Mg Cpdr (Esomeprazole magnesium) .Marland Kitchen.. 1 by mouth qam ( medically necessary)  Patient Instructions: 1)  Please schedule a follow-up appointment in 3 months. 2)  BMP prior to visit, ICD-9: 3)  HbgA1C prior to visit, ICD-9:250.00 Prescriptions: NEXIUM 40 MG CPDR (ESOMEPRAZOLE MAGNESIUM) 1 by mouth qam ( medically necessary)  #90 x 3   Entered and Authorized by:   Tresa Garter MD   Signed by:   Tresa Garter MD on 04/10/2010   Method used:   Print then Give to Patient   RxID:   [id number] NEXIUM 40 MG CPDR (ESOMEPRAZOLE MAGNESIUM) 1 by mouth qam ( medically necessary)  #30 x 12   Entered and Authorized by:   Tresa Garter MD   Signed by:   Tresa Garter MD on 04/10/2010   Method used:   Print then Give to Patient   RxID:   [id number]

## 2011-01-23 NOTE — Miscellaneous (Signed)
Summary: RX nexium  Clinical Lists Changes  Medications: Added new medication of NEXIUM 40 MG  CPDR (ESOMEPRAZOLE MAGNESIUM) 1 capsule twice a day 30 minutes before meals - Signed Rx of NEXIUM 40 MG  CPDR (ESOMEPRAZOLE MAGNESIUM) 1 capsule twice a day 30 minutes before meals;  #60 x 3;  Signed;  Entered by: Sherren Kerns RN;  Authorized by: Hart Carwin MD;  Method used: Electronically to [facility] [address] [phone]*, [address] [place], [place], [place]  [id number], Ph: [phone] or [phone], Fax: [phone] Observations: Added new observation of ALLERGY REV: Done (04/28/2010 14:40)    Prescriptions: NEXIUM 40 MG  CPDR (ESOMEPRAZOLE MAGNESIUM) 1 capsule twice a day 30 minutes before meals  #60 x 3   Entered by:   Sherren Kerns RN   Authorized by:   Hart Carwin MD   Signed by:   Sherren Kerns RN on 04/28/2010   Method used:   [facility] to        [facility]  [address] [phone]* (retail)       [address]       [place], [place]  [id number]       Ph: [phone] or [phone]       Fax: [phone]   RxID:   [id number]

## 2011-01-23 NOTE — Assessment & Plan Note (Signed)
Summary: 3 MO ROA/NML  Medications Added OMEPRAZOLE 20 MG CPDR (OMEPRAZOLE) Take 2 tabs daily DEMADEX 20 MG  TABS (TORSEMIDE) 1 by mouth qd DOXYCYCLINE HYCLATE 100 MG CAPS (DOXYCYCLINE HYCLATE) Take 1 tab twice a day        Vital Signs:  Patient Profile:   77 Years Old Female Weight:      215 pounds Temp:     97.5 degrees F oral Pulse rate:   52 / minute BP sitting:   139 / 68  (left arm)  Vitals Entered By: Tora Perches (February 06, 2008 8:02 AM)                 Chief Complaint:  Multiple medical problems or concerns.  History of Present Illness: The patient presents for a follow up of hypertension, diabetes, hyperlipidemia Husband was sick - had a card. arrest in Jan 09 after surgery C/o insomnia    Current Allergies: ! ASPIRIN EC (ASPIRIN) ! CODEINE SULFATE (CODEINE SULFATE) ! DIOVAN (VALSARTAN) ! MOTRIN IB (IBUPROFEN)  Past Medical History:    Reviewed history from 11/06/2007 and no changes required:       Colonic polyps, hx of       Diabetes mellitus, type II       Diverticulitis, hx of       Gout       Hypertension       Osteoarthritis       Hyperlipidemia       Pulmonary HTN       L hydronephrosis       L adrenal adenoma 2006       GERD       Boils 2009   Family History:    Reviewed history from 11/06/2007 and no changes required:       Family History Diabetes 1st degree relative       Family History Hypertension  Social History:    Reviewed history from 11/06/2007 and no changes required:       Retired       Married       Never Smoked    Review of Systems       No CP, stressed out with fam. sickness   Physical Exam  General:     Well-developed,well-nourished,in no acute distress; alert,appropriate and cooperative throughout examination Nose:     External nasal examination shows no deformity or inflammation. Nasal mucosa are pink and moist without lesions or exudates. Mouth:     Oral mucosa and oropharynx without lesions or  exudates.  Teeth in good repair. Neck:     No deformities, masses, or tenderness noted. Lungs:     CTA Heart:     Normal rate and regular rhythm. S1 and S2 normal without gallop, murmur, click, rub or other extra sounds. Abdomen:     S/NT Msk:     No deformity or scoliosis noted of thoracic or lumbar spine.   Extremities:     No clubbing, cyanosis, edema, or deformity noted with normal full range of motion of all joints.   Neurologic:     No cranial nerve deficits noted. Station and gait are normal. Plantar reflexes are down-going bilaterally. DTRs are symmetrical throughout. Sensory, motor and coordinative functions appear intact. Skin:     One boil on buttock Psych:     normally interactive and good eye contact.      Impression & Recommendations:  Problem # 1:  HYPERTENSION (ICD-401.9)  The following medications were  removed from the medication list:    Furosemide 20 Mg Tabs (Furosemide) ..... Once daily  Her updated medication list for this problem includes:    Verapamil Hcl Cr 180 Mg Cp24 (Verapamil hcl) ..... Once daily    Atenolol 50 Mg Tabs (Atenolol) ..... Once daily    Avapro 300 Mg Tabs (Irbesartan) ..... Once daily    Demadex 20 Mg Tabs (Torsemide) .Marland Kitchen... 1 by mouth qd   Problem # 2:  DIABETES MELLITUS, TYPE II (ICD-250.00)  Her updated medication list for this problem includes:    Actos 45 Mg Tabs (Pioglitazone hcl) ..... Once daily    Avapro 300 Mg Tabs (Irbesartan) ..... Once daily   Problem # 3:  GOUT (ICD-274.9) Assessment: Unchanged  Her updated medication list for this problem includes:    Allopurinol 300 Mg Tabs (Allopurinol) ..... Once daily   Problem # 4:  FAMILY STRESS (ICD-V61.9) Assessment: Unchanged Discussed  Problem # 5:  BOILS, RECURRENT (ICD-680.9) Do not use washcloth  Complete Medication List: 1)  Verapamil Hcl Cr 180 Mg Cp24 (Verapamil hcl) .... Once daily 2)  Atenolol 50 Mg Tabs (Atenolol) .... Once daily 3)  Actos 45 Mg Tabs  (Pioglitazone hcl) .... Once daily 4)  Avapro 300 Mg Tabs (Irbesartan) .... Once daily 5)  Allopurinol 300 Mg Tabs (Allopurinol) .... Once daily 6)  Amitriptyline Hcl 25 Mg Tabs (Amitriptyline hcl) .... Once daily 7)  Detrol La 2 Mg Cp24 (Tolterodine tartrate) .... Once daily 8)  Vicodin 5-500 Mg Tabs (Hydrocodone-acetaminophen) .... As needed 9)  Vitamin D3 1000 Unit Tabs (Cholecalciferol) .... Once daily 10)  Cod Liver Oil Caps (Cod liver oil) .... Once daily 11)  Triamcinolone Acetonide 0.5 % Crea (Triamcinolone acetonide) .... Bid 12)  Mvi  .... Once daily 13)  Omeprazole 20 Mg Cpdr (Omeprazole) .... Take 2 tabs daily 14)  Eye Drops  .... Once daily 15)  Demadex 20 Mg Tabs (Torsemide) .Marland Kitchen.. 1 by mouth qd 16)  Doxycycline Hyclate 100 Mg Caps (Doxycycline hyclate) .... Take 1 tab twice a day   Patient Instructions: 1)  Please schedule a follow-up appointment in 3 months. 2)  BMP prior to visit, ICD-9:250.00 3)  TSH prior to visit, ICD-9: 4)  HbgA1C prior to visit, ICD-9:    Prescriptions: DOXYCYCLINE HYCLATE 100 MG CAPS (DOXYCYCLINE HYCLATE) Take 1 tab twice a day  #20 x 2   Entered and Authorized by:   Tresa Garter MD   Signed by:   Tresa Garter MD on 02/06/2008   Method used:   Print then Give to Patient   RxID:   [id number] DEMADEX 20 MG  TABS (TORSEMIDE) 1 by mouth qd  #30 x 12   Entered and Authorized by:   Tresa Garter MD   Signed by:   Tresa Garter MD on 02/06/2008   Method used:   Print then Give to Patient   RxID:   [id number] OMEPRAZOLE 20 MG CPDR (OMEPRAZOLE) Take 2 tabs daily  #60 x 12   Entered and Authorized by:   Tresa Garter MD   Signed by:   Tresa Garter MD on 02/06/2008   Method used:   Print then Give to Patient   RxID:   [id number]  ]

## 2011-01-23 NOTE — Assessment & Plan Note (Signed)
Summary: 3 MO ROV /NWS $50   Vital Signs:  Patient Profile:   77 Years Old Female Height:     63 inches Weight:      200 pounds Temp:     98.7 degrees F oral Pulse rate:   60 / minute BP sitting:   174 / 84  (left arm)  Vitals Entered By: Tora Perches (February 16, 2009 8:43 AM)                 Chief Complaint:  Multiple medical problems or concerns.  History of Present Illness: The patient presents for a follow up of hypertension, diabetes, hyperlipidemia Rann out of Verapamil    Prior Medications Reviewed Using: Medication Bottles  Updated Prior Medication List: TORSEMIDE 20 MG  TABS (TORSEMIDE) TAKE 1 by mouth QD ALLOPURINOL 300 MG  TABS (ALLOPURINOL) once daily AMITRIPTYLINE HCL 25 MG  TABS (AMITRIPTYLINE HCL) once daily TRIAMCINOLONE ACETONIDE 0.5 % CREA (TRIAMCINOLONE ACETONIDE) bid OMEPRAZOLE 20 MG CPDR (OMEPRAZOLE) Take 2 tabs daily VITAMIN D3 1000 UNIT  TABS (CHOLECALCIFEROL) 1 by mouth daily DOXYCYCLINE HYCLATE 100 MG CAPS (DOXYCYCLINE HYCLATE) Take 1 tab twice a day x 7 d prn METFORMIN HCL 500 MG TABS (METFORMIN HCL) Take 1 tab twice daily BACTROBAN 2 %  OINT (MUPIROCIN) use qid HIBICLENS 4 % LIQD (CHLORHEXIDINE GLUCONATE) use qd COREG 25 MG TABS (CARVEDILOL) 1/2 by mouth two times a day x 1 wk, then 1 by mouth two times a day if tol * EYE DROPS once daily OXYBUTYNIN CHLORIDE 15 MG XR24H-TAB (OXYBUTYNIN CHLORIDE) once daily  Current Allergies (reviewed today): ! ASPIRIN EC (ASPIRIN) ! CODEINE SULFATE (CODEINE SULFATE) ! DIOVAN (VALSARTAN) ! MOTRIN IB (IBUPROFEN) LISINOPRIL (LISINOPRIL) VERAPAMIL  Past Medical History:    Reviewed history from 02/06/2008 and no changes required:       Colonic polyps, hx of       Diabetes mellitus, type II       Diverticulitis, hx of       Gout       Hypertension       Osteoarthritis       Hyperlipidemia       Pulmonary HTN       L hydronephrosis       L adrenal adenoma 2006       GERD       Boils  2009   Family History:    Reviewed history from 11/06/2007 and no changes required:       Family History Diabetes 1st degree relative       Family History Hypertension  Social History:    Reviewed history from 11/06/2007 and no changes required:       Retired       Married       Never Smoked     Physical Exam  General:     NAD Ears:     B wax Nose:     External nasal examination shows no deformity or inflammation. Nasal mucosa are pink and moist without lesions or exudates. Mouth:     Oral mucosa and oropharynx without lesions or exudates.  Teeth in good repair. Neck:     No deformities, masses, or tenderness noted. Lungs:     Normal respiratory effort, chest expands symmetrically. Lungs are clear to auscultation, no crackles or wheezes. Heart:     Normal rate and regular rhythm. S1 and S2 normal without gallop, murmur, click, rub or other extra sounds. Abdomen:  Bowel sounds positive,abdomen soft and non-tender without masses, organomegaly or hernias noted. Msk:     No deformity or scoliosis noted of thoracic or lumbar spine.   Extremities:     No clubbing, cyanosis, edema, or deformity noted with normal full range of motion of all joints.   Neurologic:     No cranial nerve deficits noted. Station and gait are normal. Plantar reflexes are down-going bilaterally. DTRs are symmetrical throughout. Sensory, motor and coordinative functions appear intact. Psych:     Oriented X3.      Impression & Recommendations:  Problem # 1:  BOILS, RECURRENT (ICD-680.9) Assessment: Unchanged On prescription therapy prn  Problem # 2:  HYPERTENSION (ICD-401.9) Assessment: Deteriorated Risks of noncompliance with treatment discussed. Compliance encouraged.  Her updated medication list for this problem includes:    Torsemide 20 Mg Tabs (Torsemide) .Marland Kitchen... Take 1 by mouth qd    Coreg 25 Mg Tabs (Carvedilol) .Marland Kitchen... 1 by mouth two times a day    Verapamil Hcl Cr 240 Mg Tbcr  (Verapamil hcl) .Marland Kitchen... 1 by mouth daily restart  The following medications were removed from the medication list:    Verapamil Hcl Cr 240 Mg Tbcr (Verapamil hcl) .Marland Kitchen... 1 by mouth daily  Her updated medication list for this problem includes:    Torsemide 20 Mg Tabs (Torsemide) .Marland Kitchen... Take 1 by mouth qd    Coreg 25 Mg Tabs (Carvedilol) .Marland Kitchen... 1 by mouth two times a day    Amlodipine Besylate 5 Mg Tabs (Amlodipine besylate) .Marland Kitchen... 1 by mouth qd   Problem # 3:  GERD (ICD-530.81) Assessment: Unchanged  Her updated medication list for this problem includes:    Omeprazole 20 Mg Cpdr (Omeprazole) .Marland Kitchen... Take 2 tabs daily   Problem # 4:  DIABETES MELLITUS, TYPE II (ICD-250.00) Assessment: Unchanged  Her updated medication list for this problem includes:    Metformin Hcl 500 Mg Tabs (Metformin hcl) .Marland Kitchen... Take 1 tab twice daily   Problem # 5:  CERUMEN IMPACTION (ICD-380.4) Assessment: Deteriorated Procedure: ear irrigation Reason: wax impaction R>L Risks/benefis were discussed. Both ears were irrigated with warm water. Large ammount of wax was recovered. Instrumentation with metal ear loop was performed to accomplish the removal. Tolerated well Complications: none   Orders: Cerumen Impaction Removal (14782)   Complete Medication List: 1)  Torsemide 20 Mg Tabs (Torsemide) .... Take 1 by mouth qd 2)  Allopurinol 300 Mg Tabs (Allopurinol) .Marland Kitchen.. 1 once daily 3)  Amitriptyline Hcl 25 Mg Tabs (Amitriptyline hcl) .... Once daily 4)  Triamcinolone Acetonide 0.5 % Crea (Triamcinolone acetonide) .... Bid 5)  Omeprazole 20 Mg Cpdr (Omeprazole) .... Take 2 tabs daily 6)  Vitamin D3 1000 Unit Tabs (Cholecalciferol) .Marland Kitchen.. 1 by mouth daily 7)  Doxycycline Hyclate 100 Mg Caps (Doxycycline hyclate) .... Take 1 tab twice a day x 7 d prn 8)  Metformin Hcl 500 Mg Tabs (Metformin hcl) .... Take 1 tab twice daily 9)  Bactroban 2 % Oint (Mupirocin) .... Use qid 10)  Hibiclens 4 % Liqd (Chlorhexidine  gluconate) .... Use qd 11)  Coreg 25 Mg Tabs (Carvedilol) .Marland Kitchen.. 1 by mouth two times a day 12)  Eye Drops  .... Once daily 13)  Oxybutynin Chloride 15 Mg Xr24h-tab (Oxybutynin chloride) .... Once daily 14)  Amlodipine Besylate 5 Mg Tabs (Amlodipine besylate) .Marland Kitchen.. 1 by mouth qd   Patient Instructions: 1)  Please schedule a follow-up appointment in 3 months. 2)  BMP prior to visit, ICD-9:250.00 3)  HbgA1C prior  to visit, ICD-9:   Prescriptions: AMLODIPINE BESYLATE 5 MG TABS (AMLODIPINE BESYLATE) 1 by mouth qd  #30 x 12   Entered and Authorized by:   Tresa Garter MD   Signed by:   Tresa Garter MD on 02/17/2009   Method used:   Print then Give to Patient   RxID:   [id number] DOXYCYCLINE HYCLATE 100 MG CAPS (DOXYCYCLINE HYCLATE) Take 1 tab twice a day x 7 d prn  #56 x 3   Entered and Authorized by:   Tresa Garter MD   Signed by:   Tresa Garter MD on 02/16/2009   Method used:   Print then Give to Patient   RxID:   [id number] OXYBUTYNIN CHLORIDE 15 MG XR24H-TAB (OXYBUTYNIN CHLORIDE) once daily  #90 x 3   Entered and Authorized by:   Tresa Garter MD   Signed by:   Tresa Garter MD on 02/16/2009   Method used:   Print then Give to Patient   RxID:   [id number] METFORMIN HCL 500 MG TABS (METFORMIN HCL) Take 1 tab twice daily  #180 x 3   Entered and Authorized by:   Tresa Garter MD   Signed by:   Tresa Garter MD on 02/16/2009   Method used:   Print then Give to Patient   RxID:   [id number] OMEPRAZOLE 20 MG CPDR (OMEPRAZOLE) Take 2 tabs daily  #180 x 3   Entered and Authorized by:   Tresa Garter MD   Signed by:   Tresa Garter MD on 02/16/2009   Method used:   Print then Give to Patient   RxID:   [id number] AMITRIPTYLINE HCL 25 MG  TABS (AMITRIPTYLINE HCL) once daily  #90 x 3   Entered and Authorized by:   Tresa Garter MD   Signed by:   Tresa Garter MD on 02/16/2009   Method  used:   Print then Give to Patient   RxID:   [id number] TORSEMIDE 20 MG  TABS (TORSEMIDE) TAKE 1 by mouth QD  #90 x 3   Entered and Authorized by:   Tresa Garter MD   Signed by:   Tresa Garter MD on 02/16/2009   Method used:   Print then Give to Patient   RxID:   [id number] ALLOPURINOL 300 MG  TABS (ALLOPURINOL) 1 once daily  #90 x 3   Entered and Authorized by:   Tresa Garter MD   Signed by:   Tresa Garter MD on 02/16/2009   Method used:   Print then Give to Patient   RxID:   [id number] COREG 25 MG TABS (CARVEDILOL) 1 by mouth two times a day  #180 x 3   Entered and Authorized by:   Tresa Garter MD   Signed by:   Tresa Garter MD on 02/16/2009   Method used:   Print then Give to Patient   RxID:   [id number] VERAPAMIL HCL CR 240 MG TBCR (VERAPAMIL HCL) 1 by mouth daily  #90 x 3   Entered and Authorized by:   Tresa Garter MD   Signed by:   Tresa Garter MD on 02/16/2009   Method used:   Print then Give to Patient   RxID:   [id number]

## 2011-01-23 NOTE — Op Note (Signed)
Summary: Cystoscopy  NAME:  Breanna [name], Breanna [name]                   ACCOUNT NO.:  [id number]   MEDICAL RECORD NO.:  [id number]          PATIENT TYPE:  INP   LOCATION:  5704                         FACILITY:  [facility]   PHYSICIAN:  Lindaann Slough, M.D.  DATE OF BIRTH:  [birth year]   DATE OF PROCEDURE:  06/11/2005  DATE OF DISCHARGE:                                 OPERATIVE REPORT   PREOPERATIVE DIAGNOSIS:  Sigmoid diverticulitis.   POSTOPERATIVE DIAGNOSIS:  Sigmoid diverticulitis.   OPERATION PERFORMED:  Cystoscopy and attempted insertion of ureteral stents.   SURGEON:  Lindaann Slough, M.D.   ANESTHESIA:  General.   INDICATIONS FOR PROCEDURE:  The patient is a 77 year old female who is  scheduled for exploratory laparotomy and left colectomy for sigmoid  diverticulitis by Cherylynn Ridges, M.D. and I was asked to insert  double-J  catheter in preparation for the surgery.   DESCRIPTION OF PROCEDURE:  Under general anesthesia, the patient was prepped  and draped and placed in the dorsal lithotomy position.  A #22 Wappler  cystoscope was inserted in the bladder.  The bladder mucosa is normal.  There no stone or tumor in the bladder.  It is difficult to visualize the  ureteral orifices.  I then asked the nurse anesthetist to inject one ampule  of indigo carmine intravenously.  After 15 minutes I was still unable to see  any blue coming out of the ureteral orifices.  Several attempts were made to  catheterize what appeared to be the ureteral orifices, but I was unable to  clearly visualize the orifices.  At that point, it was decided to end the  procedure. The cystoscope was removed.  A #16 Foley catheter  was then  inserted in the bladder.  The patient was then placed in supine position for  surgery by Dr. Lindie Spruce.       MN/MEDQ  D:  06/11/2005  T:  06/12/2005  Job:  [id number]   cc:   Cherylynn Ridges, M.D.  [address]  [place]  [place] [address]

## 2011-01-23 NOTE — Miscellaneous (Signed)
Summary: rx prilosec bid  Clinical Lists Changes  Medications: Added new medication of PRILOSEC OTC 20 MG  TBEC (OMEPRAZOLE MAGNESIUM) 1 twice a day 30 minutes before meals - Signed Rx of PRILOSEC OTC 20 MG  TBEC (OMEPRAZOLE MAGNESIUM) 1 twice a day 30 minutes before meals;  #60 x 11;  Signed;  Entered by: Sherren Kerns RN;  Authorized by: Hart Carwin MD;  Method used: [facility] [address] [phone]*, [address] [place], [place], [place]  [id number], Ph: [phone] or [phone], Fax: [phone] Observations: Added new observation of ALLERGY REV: Done (07/11/2009 14:54)    Prescriptions: PRILOSEC OTC 20 MG  TBEC (OMEPRAZOLE MAGNESIUM) 1 twice a day 30 minutes before meals  #60 x 11   Entered by:   Sherren Kerns RN   Authorized by:   Hart Carwin MD   Signed by:   Sherren Kerns RN on 07/11/2009   Method used:   [facility]        [facility]  [address] [phone]* (retail)       [address]       [place], [place]  [id number]       Ph: [phone] or [phone]       Fax: [phone]   RxID:   [id number]

## 2011-01-23 NOTE — Miscellaneous (Signed)
Summary: TORSEMIDE  Medications Added TORSEMIDE 20 MG  TABS (TORSEMIDE) TAKE 1 by mouth QD       Clinical Lists Changes  Medications: Added new medication of TORSEMIDE 20 MG  TABS (TORSEMIDE) TAKE 1 by mouth QD - Signed Rx of TORSEMIDE 20 MG  TABS (TORSEMIDE) TAKE 1 by mouth QD;  #30 x 6;  Signed;  Entered by: Orlan Leavens;  Authorized by: Tresa Garter MD;  Method used: Electronic    Prescriptions: TORSEMIDE 20 MG  TABS (TORSEMIDE) TAKE 1 by mouth QD  #30 x 6   Entered by:   Orlan Leavens   Authorized by:   Tresa Garter MD   Signed by:   Orlan Leavens on 03/05/2008   Method used:   Electronically sent to ...       [facility] [address] [phone]*       [address]       [place], [place]  [address]       Ph: [phone] or [phone]       Fax: [phone]   RxID:   [id number]

## 2011-01-23 NOTE — Progress Notes (Signed)
Summary: Boils  Medications Added DOXYCYCLINE HYCLATE 100 MG CAPS (DOXYCYCLINE HYCLATE) 1 po .bid       Phone Note Call from Patient Call back at [facility] Phone [phone]   Summary of Call: Pt says that she has previously been treated for boils on her skin. They are reoccuring what do you suggest? Initial call taken by: Lamar Sprinkles,  January 14, 2008 11:50 AM  Follow-up for Phone Call        Doxy x 10d OV if problems Follow-up by: Tresa Garter MD,  January 14, 2008 5:30 PM  Additional Follow-up for Phone Call Additional follow up Details #1::        left mess to call office back with pharm Additional Follow-up by: Lamar Sprinkles,  January 14, 2008 5:40 PM    Additional Follow-up for Phone Call Additional follow up Details #2::    Patient informed  Follow-up by: Lamar Sprinkles,  January 15, 2008 8:09 AM  New/Updated Medications: DOXYCYCLINE HYCLATE 100 MG CAPS (DOXYCYCLINE HYCLATE) 1 po .bid   Prescriptions: DOXYCYCLINE HYCLATE 100 MG CAPS (DOXYCYCLINE HYCLATE) 1 po .bid  #20 x 1   Entered by:   Lamar Sprinkles   Authorized by:   Tresa Garter MD   Signed by:   Lamar Sprinkles on 01/15/2008   Method used:   Electronically [facility] to ...       [facility] [address] [phone]*       [address]       [place], [place]  [address]       Ph: [phone] or [phone]       Fax: [phone]   RxID:   [id number] DOXYCYCLINE HYCLATE 100 MG CAPS (DOXYCYCLINE HYCLATE) 1 po .bid  #20 x 1   Entered and Authorized by:   Tresa Garter MD   Signed by:   Tresa Garter MD on 01/14/2008   Method used:   Print then Give to Patient   RxID:   [id number]

## 2011-01-23 NOTE — Medication Information (Signed)
Summary: Pantoprazole Approved til 07/18/10  Pantoprazole Approved til 07/18/10   Imported By: Lester Iona 07/20/2009 11:47:07  _____________________________________________________________________  External Attachment:    Type:   Image     Comment:   External Document

## 2011-01-23 NOTE — Progress Notes (Signed)
Summary: ?  Phone Note Call from Patient Call back at Home Phone [phone]   Summary of Call: Patient is requesting a call about med she is on that is not on our list.  Initial call taken by: Lamar Sprinkles,  December 03, 2008 3:58 PM  Follow-up for Phone Call        Pt is on Atenolol 50mg  1/2 once daily, EMR updated Follow-up by: Lamar Sprinkles,  December 06, 2008 5:32 PM  Additional Follow-up for Phone Call Additional follow up Details #1::        Cont with it Additional Follow-up by: Tresa Garter MD,  December 07, 2008 7:55 AM    New/Updated Medications: ATENOLOL 50 MG TABS (ATENOLOL) 1/2 once daily

## 2011-01-23 NOTE — Letter (Signed)
Summary: [facility] Consult Scheduled Letter  [facility]  [address] [place], [place] [address]   Phone: [phone]  Fax: [phone]      05/17/2009 MRN: [id number]  [facility] [address] [place], [place]  [address]    Dear Ms. Helderman,      We have scheduled an appointment for you.  At the recommendation of Dr.Plotnikov, we have scheduled you a consult with Dr Christella Hartigan on 06/15/09 at 9:15am.  Their phone number is [phone].  If this appointment day and time is not convenient for you, please feel free to call the office of the doctor you are being referred to at the number listed above and reschedule the appointment.     [facility] [address] [place], [place] [address] *Gastroenterology Dept.3rd Floor*   Please give 24hr notice if you need to cancel/reschedule to avoid a $50.00 fee.Also bring insurance card and any co-pay due at time of visit.     Thank you,  Patient Care Coordinator [facility]

## 2011-01-23 NOTE — Assessment & Plan Note (Signed)
Summary: FLU/PLOT/CD   Nurse Visit   Allergies: 1)  ! Aspirin Ec (Aspirin) 2)  ! Codeine Sulfate (Codeine Sulfate) 3)  ! Diovan (Valsartan) 4)  ! Motrin Ib (Ibuprofen) 5)  Lisinopril (Lisinopril) 6)  Verapamil  Orders Added: 1)  Flu Vaccine 52yrs + [90658] 2)  Administration Flu vaccine - MCR [G0008]       Flu Vaccine Consent Questions     Do you have a history of severe allergic reactions to this vaccine? no    Any prior history of allergic reactions to egg and/or gelatin? no    Do you have a sensitivity to the preservative Thimersol? no    Do you have a past history of Guillan-Barre Syndrome? no    Do you currently have an acute febrile illness? no    Have you ever had a severe reaction to latex? no    Vaccine information given and explained to patient? yes    Are you currently pregnant? no    Lot Number:[id number]   Exp Date:06/22/2010   Site Given  Left Deltoid IMu

## 2011-01-23 NOTE — Miscellaneous (Signed)
Summary: Nexium Approval/[employer]         Case ID: [id number] Member Number: [id number] Case Type: Initial Review Case Start Date: 07/03/2010 Case Status: Coverage has been APPROVED. You will receive a confirmation letter confirming approval of this medication. The patient will also be notified of this approval via an automated outbound phone call or a letter. Please allow approximately 2 hours to update our system with the approval. Once updated, the prescription can be re-submitted.   Coverage Start Date: 06/12/2010 Coverage End Date: 07/03/2011  Patient First Name: Breanna Patient Last Name: [name] DOB: [birth year] Patient Street Address: [address]   Patient City: [place] Patient State: [place] Patient Zip: [phone]  Drug Name & Strength: Nexium 40 Mg

## 2011-01-23 NOTE — Procedures (Signed)
Summary: EGD   EGD  Procedure date:  07/11/2009  Findings:      Location: [facility]   ENDOSCOPY PROCEDURE REPORT  PATIENT:  Breanna [name], Breanna [name]  MR#:  [id number] BIRTHDATE:   1931/04/23, 77 yrs. old   GENDER:   female  ENDOSCOPIST:   Hedwig Morton. Juanda Chance, MD Referred by:   PROCEDURE DATE:  07/11/2009 PROCEDURE:  EGD with biopsy ASA CLASS:   Class II INDICATIONS: chest pain, dysphagia EGD 1994 with es.dil.  EGD 2005 no stricture  MEDICATIONS:   None TOPICAL ANESTHETIC:    DESCRIPTION OF PROCEDURE:   After the risks benefits and alternatives of the procedure were thoroughly explained, informed consent was obtained.  The LB [id number] endoscope was introduced through the mouth and advanced to the second portion of the duodenum, without limitations.  The instrument was slowly withdrawn as the mucosa was fully examined. <<PROCEDUREIMAGES>>                <<OLD IMAGES>>  irregular Z-line. With standard forceps, a biopsy was obtained and sent to pathology (see image1, image2, and image7).  There were multiple polyps identified. in the fundus. multiple fundic gland polyps With standard forceps, a biopsy was obtained and sent to pathology (see image5 and image6).  Otherwise the examination was normal (see image8, image3, and image4). no evidence of esoph stricture    Retroflexed views revealed no abnormalities.    The scope was then withdrawn from the patient and the procedure completed.  COMPLICATIONS:   None  ENDOSCOPIC IMPRESSION:  1) Irregular Z-line  2) Polyps, multiple in the fundus  3) Otherwise normal examination  no evidence of an esophageal stricture, suspect esophageal spasm RECOMMENDATIONS:  1) My office will arrange for you to have a Barium Esophagram performed. This is a radiology test to examine your esophagus.  2) await biopsy results  Prelosec 20 mg po bid  REPEAT EXAM:   In 0 year(s) for.   _______________________________ Hedwig Morton. Juanda Chance,  MD    CC:     REPORT OF SURGICAL PATHOLOGY   Case #: [id number] Patient Name: Breanna [name], Breanna [name]. Office Chart Number:  [id number]   MRN: [id number] Pathologist: Ferd Hibbs. Colonel Bald, MD DOB/Age  [birth year] (Age: 66)    Gender: F Date Taken:  07/11/2009 Date Received: 07/12/2009   FINAL DIAGNOSIS   ***MICROSCOPIC EXAMINATION AND DIAGNOSIS***   1.  STOMACH, POLYP: -  FUNDIC GLAND POLYP(S). -  THERE IS NO EVIDENCE OF HELICOBACTER PYLORI, INTESTINAL METAPLASIA, DYSPLASIA, OR MALIGNANCY. -  SEE COMMENT.        2.  GASTROESOPHAGEAL JUNCTION, BIOPSY: - GASTROESOPHAGEAL JUNCTION MUCOSA WITH MILD INFLAMMATION CONSISTENT WITH  GASTROESOPHAGEAL REFLUX.   -  NO INTESTINAL METAPLASIA, DYSPLASIA OR MALIGNANCY IDENTIFIED. -  SEE COMMENT.        COMMENT 1. A Warthin-Starry stain is performed to determine the possibility of the presence of Helicobacter pylori. The Warthin-Starry stain is negative for organisms of Helicobacter pylori. The control(s) stained appropriately.   2. An Alcian Blue stain is performed to determine the presence of intestinal metaplasia (goblet cell metaplasia). No intestinal metaplasia (goblet cell metaplasia) is identified with the Alcian Blue stain. The control stained appropriately. (JK:jy) 07/13/09   jy Date Reported:  07/13/2009     Ferd Hibbs. Colonel Bald, MD *** Electronically Signed Out By [facility] ***    July 13, 2009 MRN: [id number]    [facility] [address] [place], [place]  [address]    Dear Ms. Elbaum,  I  am pleased to inform you that the biopsies taken during your recent endoscopic examination did not show any evidence of cancer upon pathologic examination.The biopsy of the esophagus show mild inflammation due to reflux  Additional information/recommendations:  __No further action is needed at this time.  Please follow-up with      your primary care physician for your other healthcare needs.  __ Please call [phone] to schedule a return visit to review       your condition.  _x_ Continue with the treatment plan as outlined on the day of your      exam.  __ You should have a repeat endoscopic examination for this problem              in _ months/years.   Please call us if you are having persistent problems or have questions about your condition that have not been fully answered at this time.  Sincerely,  Hart Carwin MD  This letter has been electronically signed by your physician.  This report was created from the original endoscopy report, which was reviewed and signed by the above listed endoscopist.

## 2011-01-23 NOTE — Miscellaneous (Signed)
Summary: Orders Update  Clinical Lists Changes  Orders: Added new Test order of Venous Duplex Lower Extremity (Venous Duplex Lower) - Signed 

## 2011-01-23 NOTE — Letter (Signed)
Summary: Patient [facility] Biopsy Results  [facility]  [address] [place], [place] [address]   Phone: [phone]  Fax: [phone]        May 02, 2010 MRN: [id number]    [facility] [address] [place], [place]  [address]    Dear Ms. Lanese,  I am pleased to inform you that the biopsies taken during your recent endoscopic examination did not show any evidence of cancer upon pathologic examination.  Additional information/recommendations:  __No further action is needed at this time.  Please follow-up with      your primary care physician for your other healthcare needs.  __ Please call [phone] to schedule a return visit to review      your condition.  _x_ Continue with the treatment plan as outlined on the day of your      exam.  _.   Please call us if you are having persistent problems or have questions about your condition that have not been fully answered at this time.  Sincerely,  Hart Carwin MD  This letter has been electronically signed by your physician.  Appended Document: Patient Notice-Endo Biopsy Results letter mailed 5.11.11

## 2011-01-23 NOTE — Assessment & Plan Note (Signed)
Summary: 4 mos /$50/cd   Vital Signs:  Patient profile:   77 year old female Weight:      193 pounds Temp:     97.8 degrees F oral Pulse rate:   53 / minute BP sitting:   134 / 64  (left arm)  Vitals Entered By: Tora Perches (September 07, 2009 8:41 AM) CC: f/u Is Patient Diabetic? Yes   Primary Care Provider:  Sonda Primes, MD  CC:  f/u.  History of Present Illness: The patient presents for a follow up of hypertension, diabetes, hyperlipidemia. C/o LBP. Had a CVA w/dizziness and abn CT   Current Medications (verified): 1)  Torsemide 20 Mg  Tabs (Torsemide) .... Take 1 By Mouth Qd 2)  Allopurinol 300 Mg  Tabs (Allopurinol) .Marland Kitchen.. 1 Once Daily 3)  Amitriptyline Hcl 25 Mg  Tabs (Amitriptyline Hcl) .... Once Daily 4)  Triamcinolone Acetonide 0.5 % Crea (Triamcinolone Acetonide) .... Bid 5)  Protonix 40 Mg Solr (Pantoprazole Sodium) .... Take 1 Tablet By Mouth Once A Day 6)  Vitamin D3 1000 Unit  Tabs (Cholecalciferol) .Marland Kitchen.. 1 By Mouth Daily 7)  Doxycycline Hyclate 100 Mg Caps (Doxycycline Hyclate) .... Take 1 Tab Twice A Day X 7 D Prn 8)  Metformin Hcl 500 Mg Tabs (Metformin Hcl) .... Take 1 Tab Twice Daily 9)  Bactroban 2 %  Oint (Mupirocin) .... Use Qid 10)  Hibiclens 4 % Liqd (Chlorhexidine Gluconate) .... Use Qd 11)  Coreg 25 Mg Tabs (Carvedilol) .Marland Kitchen.. 1 By Mouth Two Times A Day 12)  Eye Drops .... Once Daily 13)  Oxybutynin Chloride 15 Mg Xr24h-Tab (Oxybutynin Chloride) .... Once Daily 14)  Amlodipine Besylate 5 Mg Tabs (Amlodipine Besylate) .Marland Kitchen.. 1 By Mouth Qd 15)  Benzonatate 200 Mg Caps (Benzonatate) .Marland Kitchen.. 1 By Mouth Three Times A Day As Needed Cough 16)  Terazol 7 0.4 % Crea (Terconazole) .... Use Pv X 7 D As Needed Hs 17)  Prilosec Otc 20 Mg  Tbec (Omeprazole Magnesium) .Marland Kitchen.. 1 Twice A Day 30 Minutes Before Meals  Allergies: 1)  ! Aspirin Ec (Aspirin) 2)  ! Codeine Sulfate (Codeine Sulfate) 3)  ! Diovan (Valsartan) 4)  ! Motrin Ib (Ibuprofen) 5)  Lisinopril  (Lisinopril) 6)  Verapamil  Past History:  Past Medical History: Colonic polyps, hx of Diabetes mellitus, type II Diverticulitis, hx of Gout Hypertension Osteoarthritis Hyperlipidemia Pulmonary HTN L hydronephrosis L adrenal adenoma 2006 GERD Boils 2009 Cerebrovascular accident, cerebellar 2010 Low back pain  Physical Exam  General:  alert, oriented and very pleasant. Ears:  B wax Nose:  External nasal examination shows no deformity or inflammation. Nasal mucosa are pink and moist without lesions or exudates. Mouth:  no ulcers. Neck:  Supple; no masses or thyromegaly. Lungs:  Normal respiratory effort, chest expands symmetrically. Lungs are clear to auscultation, no crackles or wheezes. Heart:  clear breath sounds. Normal S1, normal S2. Abdomen:  obese soft abdomen without tenderness. Well-healed surgical scars in the lower abdomen. Liver edge at costal margin. Rectal:  normal to slightly decreased rectal tone. Stool is soft and hemoccult negative. Msk:  Lumbar-sacral spine is tender to palpation over paraspinal muscles and painfull with the ROM  Neurologic:  No cranial nerve deficits noted. Station and gait are normal. Plantar reflexes are down-going bilaterally. DTRs are symmetrical throughout. Sensory, motor and coordinative functions appear intact. Steady gait Skin:  8x7 mm furuncle on L pubic area, no fluctuation or d/c Psych:  Oriented X3.     Impression &  Recommendations:  Problem # 1:  CEREBROVASCULAR ACCIDENT, HX OF (ICD-V12.50) L cerebellar Assessment Improved Restart ASA  Problem # 2:  LOW BACK PAIN (ICD-724.2) Assessment: Deteriorated  Her updated medication list for this problem includes:    Aspirin 81 Mg Tbec (Aspirin) ..... One by mouth every day  Problem # 3:  HYPERTENSION (ICD-401.9) Assessment: Improved  Her updated medication list for this problem includes:    Torsemide 20 Mg Tabs (Torsemide) .Marland Kitchen... Take 1 by mouth qd    Coreg 25 Mg Tabs  (Carvedilol) .Marland Kitchen... 1 by mouth two times a day    Amlodipine Besylate 5 Mg Tabs (Amlodipine besylate) .Marland Kitchen... 1 by mouth qd  Problem # 4:  DIABETES MELLITUS, TYPE II (ICD-250.00) Assessment: Improved  Her updated medication list for this problem includes:    Metformin Hcl 500 Mg Tabs (Metformin hcl) .Marland Kitchen... Take 1 tab twice daily    Aspirin 81 Mg Tbec (Aspirin) ..... One by mouth every day  Problem # 5:  BOILS, RECURRENT (ICD-680.9) Assessment: Improved  Complete Medication List: 1)  Torsemide 20 Mg Tabs (Torsemide) .... Take 1 by mouth qd 2)  Allopurinol 300 Mg Tabs (Allopurinol) .Marland Kitchen.. 1 once daily 3)  Amitriptyline Hcl 25 Mg Tabs (Amitriptyline hcl) .Marland Kitchen.. 1 once daily at hs 4)  Triamcinolone Acetonide 0.5 % Crea (Triamcinolone acetonide) .... Bid 5)  Protonix 40 Mg Solr (Pantoprazole sodium) .... Take 1 tablet by mouth once a day 6)  Vitamin D3 1000 Unit Tabs (Cholecalciferol) .Marland Kitchen.. 1 by mouth daily 7)  Doxycycline Hyclate 100 Mg Caps (Doxycycline hyclate) .... Take 1 tab twice a day x 7 d prn 8)  Metformin Hcl 500 Mg Tabs (Metformin hcl) .... Take 1 tab twice daily 9)  Bactroban 2 % Oint (Mupirocin) .... Use qid 10)  Hibiclens 4 % Liqd (Chlorhexidine gluconate) .... Use qd 11)  Coreg 25 Mg Tabs (Carvedilol) .Marland Kitchen.. 1 by mouth two times a day 12)  Eye Drops  .... Once daily 13)  Oxybutynin Chloride 15 Mg Xr24h-tab (Oxybutynin chloride) .... Once daily 14)  Amlodipine Besylate 5 Mg Tabs (Amlodipine besylate) .Marland Kitchen.. 1 by mouth qd 15)  Benzonatate 200 Mg Caps (Benzonatate) .Marland Kitchen.. 1 by mouth three times a day as needed cough 16)  Terazol 7 0.4 % Crea (Terconazole) .... Use pv x 7 d as needed hs 17)  Aspirin 81 Mg Tbec (Aspirin) .... One by mouth every day  Patient Instructions: 1)  Try to eat more raw plant food, fresh and dry fruit, raw almonds, leafy vegetables, whole foods and less red meat, less animal fat. Poultry and fish is better for you than pork and beef. Avoid processed foods (canned  soups, hot dogs, sausage, bacon , frozen dinners). Avoid corn syrup, high fructose syrup or aspartam and Splenda  containing drinks. Honey, Agave and Stevia are better sweeteners. Make your own  dressing with olive oil, wine vinegar, lemon juce, garlic etc. for your salads. 2)  Use stretching exercises that I have provided (15 min. or longer every day) 3)  Please schedule a follow-up appointment in 3 months. 4)  BMP prior to visit, ICD-9:  436  995.20 250.00 5)  Hepatic Panel prior to visit, ICD-9: 6)  Lipid Panel prior to visit, ICD-9: 7)  TSH prior to visit, ICD-9: 8)  HbgA1C prior to visit, ICD-9: Prescriptions: AMITRIPTYLINE HCL 25 MG  TABS (AMITRIPTYLINE HCL) 1 once daily at hs  #90 x 3   Entered and Authorized by:   Tresa Garter MD  Signed by:   Tresa Garter MD on 09/07/2009   Method used:   Electronically to        [facility]* (mail-order)             ,          Ph: [phone]       Fax: [phone]   RxID:   [id number]

## 2011-01-23 NOTE — Medication Information (Signed)
Summary: Diabetes Supplies/[facility]  Diabetes Supplies/[facility]   Imported By: Sherian Rein 01/03/2010 14:10:21  _____________________________________________________________________  External Attachment:    Type:   Image     Comment:   External Document

## 2011-01-23 NOTE — Progress Notes (Signed)
Summary: TENORMIN  Phone Note Call from Patient   Summary of Call: Pt's kidney dr told pt to check with PCP about bp. It was elevated while at office visit with him. She says her last med list shows her off tenormin. Should she be on this? I see it was removed 01/06/2009 Initial call taken by: Lamar Sprinkles, CMA,  January 02, 2010 1:33 PM  Follow-up for Phone Call        No. Start Spironolactone Follow-up by: Tresa Garter MD,  January 02, 2010 2:41 PM  Additional Follow-up for Phone Call Additional follow up Details #1::        Patient notified. prescription sent. Additional Follow-up by: Lucious Groves,  January 02, 2010 4:27 PM   New Allergies: HYDROCHLOROTHIAZIDE ATENOLOL New/Updated Medications: SPIRONOLACTONE 50 MG TABS (SPIRONOLACTONE) 1 by mouth qam for blood pressure New Allergies: HYDROCHLOROTHIAZIDE ATENOLOLPrescriptions: SPIRONOLACTONE 50 MG TABS (SPIRONOLACTONE) 1 by mouth qam for blood pressure  #30 x 12   Entered and Authorized by:   Tresa Garter MD   Signed by:   Lucious Groves on 01/02/2010   Method used:   Electronically to        [facility]  [address] [phone] (retail)       [address]       [place], [place]  [id number]       Ph: [phone] or [phone]       Fax: [phone]   RxID:   [id number]

## 2011-01-23 NOTE — Discharge Summary (Signed)
Summary: Diverticulitis/Anemia  NAMEEILEEN, [name] NO.:  [id number]   MEDICAL RECORD NO.:  [id number]          PATIENT TYPE:  INP   LOCATION:                               FACILITY:  [facility]   PHYSICIAN:  Rene Paci, M.D. [facility]  DATE OF BIRTH:   DATE OF ADMISSION:  06/06/2005  DATE OF DISCHARGE:  06/19/2005                                 DISCHARGE SUMMARY   DISCHARGE DIAGNOSES:  1.  Status post left colectomy, June 11, 2005, for diverticulitis.  2.  Anemia.   HISTORY OF PRESENT ILLNESS:  The patient  is a 77 year old female who was  admitted with complaints of weakness and abdominal pain.  The patient has a  history of recurrent diverticulitis and was scheduled for surgery on June 15, 2005, however, presented prior to surgery secondary to not feeling well.   PAST MEDICAL HISTORY:  1.  Diabetes, type 2.  2.  Diverticulitis.  3.  Hypertension.   COURSE OF HOSPITALIZATION:  PROBLEM #1 - REFRACTORY DIVERTICULITIS:  The  patient was maintained on Unasyn and Flagyl.  Surgical consult was obtained.  The patient was given IV hydration and blood sugars were carefully  monitored.  The patient underwent surgery on June 11, 2005.  On June 11, 2005, the patient also underwent a cystoscopy and an attempt at insertion of  ureteral stent secondary to hydroureter and hydronephrosis noted on CT of  abdomen.  The patient then underwent a sigmoid colectomy with primary  anastomosis and a repair of a cecal tear by Dr. Jimmye Norman.  The patient  was maintained postoperatively on IV antibiotics and she was given an NG  tube per surgery to low wall suction.  Diet was advanced.  The patient was  noted to be anemic postoperatively with a hemoglobin of 7.9 and transfusion  was ordered by surgical team.   The patient's blood pressure was noted to be elevated postoperatively.  The  patient was returned to her home blood pressure medications once she was  tolerating  p.o.'s.   MEDICATIONS AT DISCHARGE:  1.  Verapamil 360 mg p.o. daily.  2.  Iron one tablet p.o. b.i.d.  3.  Protonix 40 mg p.o. daily.  4.  K-Dur 20 mg p.o. daily.  5.  Allopurinol 300 mg p.o. daily.  6.  Torsemide 20 mg p.o. daily.  7.  Actos 45 mg p.o. daily.  8.  Atenolol 25 mg p.o. daily.  9.  Avapro 300 mg p.o. daily.   LABORATORIES AT DISCHARGE:  Hemoglobin 9.8, hematocrit 30.3, platelet count  225,000, white blood cell count 4.8.  BUN 14, creatinine 0.9.   FOLLOWUP:  A followup appointment was scheduled for the patient to see Dr.  Jimmye Norman on July 17, 2005 and for the patient to follow up with Dr.  Posey Rea in approximately 2 weeks or as needed.      Melissa S. Peggyann Juba, NP      Rene Paci, M.D. [facility]  Electronically Signed    MSO/MEDQ  D:  08/15/2005  T:  08/16/2005  Job:  [id number]   cc:   Georgina Quint. Plotnikov, M.D. [facility]  [address]  [place]  [place] [address]

## 2011-01-23 NOTE — Assessment & Plan Note (Signed)
Summary: F/U Chest pain, Epigastric pain, saw PA    History of Present Illness Visit Type: Follow-up Visit Primary GI MD: Lina Sar MD Primary Provider: Sula Soda, MD  Requesting Provider: na Chief Complaint: F/u for epigastric pain, chest pain, and GERD. Pt states that she feels great and denies any GI complaints  History of Present Illness:   This is a 77 year old African American female with a long gastrointestinal history. She was seen in the emergency room several weeks ago with severe chest pain which was noncardiac. An upper abdominal ultrasound showed a single gallstone but her liver function tests were normal and a HIDA scan was normal as well. Her last upper endoscopy in July 2010 showed an irregular Z line but no stricture. A barium esophagram done in July 2010 showed a few tertiary contractions but no reflux. A 13 mm tablet passed without delay. A prior upper endoscopy in January 2004 showed a 2 cm hiatal hernia and esophagitis. Her last colonoscopy in June 2005 showed diverticulitis and diverticulosis. Her CT scan of the abdomen and pelvis in 2005 showed thickening of the left colon. She underwent a sigmoid resection in June 2006 complicated by cecal perforation. A small bowel follow-through in January 2008 was normal with poor visualization of the terminal ileum. Other medical problems include diabetes, left ureteral obstruction, spinal stenosis and a history of elevated alkaline phosphatase.   GI Review of Systems      Denies abdominal pain, acid reflux, belching, bloating, chest pain, dysphagia with liquids, dysphagia with solids, heartburn, loss of appetite, nausea, vomiting, vomiting blood, weight loss, and  weight gain.        Denies anal fissure, black tarry stools, change in bowel habit, constipation, diarrhea, diverticulosis, fecal incontinence, heme positive stool, hemorrhoids, irritable bowel syndrome, jaundice, light color stool, liver problems, rectal bleeding,  and  rectal pain.    Current Medications (verified): 1)  Torsemide 20 Mg  Tabs (Torsemide) .... Take 1 By Mouth Qd 2)  Allopurinol 300 Mg  Tabs (Allopurinol) .Marland Kitchen.. 1 Once Daily 3)  Amitriptyline Hcl 25 Mg  Tabs (Amitriptyline Hcl) .Marland Kitchen.. 1 Once Daily At [facility] 4)  Triamcinolone Acetonide 0.5 % Crea (Triamcinolone Acetonide) .... Bid 5)  Vitamin D3 2000 Unit Caps (Cholecalciferol) .... Take One By Mouth Once Daily 6)  Doxycycline Hyclate 100 Mg Caps (Doxycycline Hyclate) .... Take 1 Tab Twice A Day X 7 D Prn 7)  Metformin Hcl 500 Mg Tabs (Metformin Hcl) .... Take 1 Tab Twice Daily 8)  Bactroban 2 %  Oint (Mupirocin) .... Use Qid 9)  Coreg 25 Mg Tabs (Carvedilol) .Marland Kitchen.. 1 By Mouth Two Times A Day 10)  Eye Drops .... Once Daily 11)  Amlodipine Besylate 5 Mg Tabs (Amlodipine Besylate) .Marland Kitchen.. 1 By Mouth Qd 12)  Benzonatate 200 Mg Caps (Benzonatate) .Marland Kitchen.. 1 By Mouth Three Times A Day As Needed Cough 13)  Terazol 7 0.4 % Crea (Terconazole) .... Use Pv X 7 D As Needed Hs 14)  Aspirin 81 Mg  Tbec (Aspirin) .... One By Mouth Every Day 15)  Spironolactone 50 Mg Tabs (Spironolactone) .Marland Kitchen.. 1 By Mouth Qam For Blood Pressure 16)  Aloe Vera .... Take One By Mouth Once Daily 17)  Levsin/sl 0.125 Mg Subl (Hyoscyamine Sulfate) .... Take 1 Under Tongue Every 4-6 Hours As Needed For Pain and Spasms 18)  Nexium 40 Mg Cpdr (Esomeprazole Magnesium) .Marland Kitchen.. 1 By Mouth Qam ( Medically Necessary)  Allergies (verified): 1)  ! Aspirin Ec (Aspirin) 2)  !  Codeine Sulfate (Codeine Sulfate) 3)  ! Diovan (Valsartan) 4)  ! Motrin Ib (Ibuprofen) 5)  Lisinopril (Lisinopril) 6)  Verapamil 7)  Hydrochlorothiazide 8)  Atenolol  Past History:  Past Medical History: Reviewed history from 04/10/2010 and no changes required. Colonic polyps, hx of Diabetes mellitus, type II Diverticulitis, hx of Gout Hypertension Osteoarthritis Hyperlipidemia Pulmonary HTN L hydronephrosis L adrenal adenoma 2006 GERD w/esophaageal spasm  2011 Boils 2009 Cerebrovascular accident, cerebellar 2010 Low back pain cholelithiasis asympt. with a nl HIDA 4/11  Past Surgical History: Reviewed history from 03/27/2010 and no changes required. SIGMOID COLECTOMY 2006 Hysterectomy Total knee replacement L 2003 R rot cuff surg 08 Right Breast Biopsy Knee replacement artho Surgical Intervention for bilateral hydronephrosis Cataract Extraction -Bilateral Bilateral Foot Reconstruction Vericose Vein Stripping-lower extremities hammer toe surgery back surgery  Family History: Reviewed history from 03/27/2010 and no changes required. Family History Diabetes 1st degree relative: Family History Hypertension Family History of Heart Disease:  No FH of Colon Cancer: Family History of Prostate Cancer: father, maternal uncle Family History of Breast Cancer: daughter  Social History: Reviewed history from 06/14/2009 and no changes required. Retired Married Never Smoked Alcohol Use - no Illicit Drug Use - no  Review of Systems       The patient complains of arthritis/joint pain.  The patient denies allergy/sinus, anemia, anxiety-new, back pain, blood in urine, breast changes/lumps, change in vision, confusion, cough, coughing up blood, depression-new, fainting, fatigue, fever, headaches-new, hearing problems, heart murmur, heart rhythm changes, itching, menstrual pain, muscle pains/cramps, night sweats, nosebleeds, pregnancy symptoms, shortness of breath, skin rash, sleeping problems, sore throat, swelling of feet/legs, swollen lymph glands, thirst - excessive , urination - excessive , urination changes/pain, urine leakage, vision changes, and voice change.         Pertinent positive and negative review of systems were noted in the above HPI. All other ROS was otherwise negative.   Vital Signs:  Patient profile:   77 year old female Height:      64 inches Weight:      194 pounds BMI:     33.42 BSA:     1.93 Pulse rate:   64 /  minute Pulse rhythm:   regular BP sitting:   116 / 60  (left arm) Cuff size:   regular  Vitals Entered By: Ok Anis CMA (Apr 25, 2010 9:17 AM)  Physical Exam  General:  Well developed, well nourished, no acute distress. Eyes:  PERRLA, no icterus. Mouth:  No deformity or lesions, dentition normal. Neck:  Supple; no masses or thyromegaly. Lungs:  Clear throughout to auscultation. Heart:  Regular rate and rhythm; no murmurs, rubs,  or bruits. Abdomen:  obese soft abdomen with no tenderness or abnormality. Her liver was 2-3 cm below the right costal margin and was nontender. There was no ascites. Extremities:  No clubbing, cyanosis, edema or deformities noted. Skin:  Intact without significant lesions or rashes. Psych:  Alert and cooperative. Normal mood and affect.   Impression & Recommendations:  Problem # 1:  CHEST PAIN (ICD-786.50) Patient has had severe episodes of substernal chest pain felt to be noncardiac. Possibilities include esophageal spasms in the setting of a  hiatal hernia  and esophageal dysmotility. Other possibilities  include symptomatic gallstones. Her liver function tests at the time of the episodes were normal and her HIDA scan was normal as well. I do not feel that her symptoms are likely to be biliary. She has been asymptomatic on twice a  day dosing of her PPI. We will continue her on Nexium 40 mg b.i.d. I will schedule her for an upper endoscopy.  Problem # 2:  DIVERTICULOSIS-COLON (ICD-562.10) Patient is status post remote sigmoid resection for recurrent diverticulitis in 2006.  Problem # 3:  CHOLELITHIASIS (ICD-574.2) Patient had a normal HIDA scan and liver function tests.  Problem # 4:  COLONIC POLYPS, HX OF (ICD-V12.72) Small polyps were found on a colonoscopy in 2001. No pathology was obtained. Her last colonoscopy was in 2005. A recall colonoscopy will be due in 10 years which would be in June 2015.  Other Orders: EGD (EGD)  Patient  Instructions: 1)  Please come to [facility] 4th floor on 04/28/10 for your endoscopy. You will need to arrive at 1:00 pm for registration. 2)  Please pick up your Nexium prescription at the pharmacy. We have sent that prescription electronically for you. We have also provided you with some samples. 3)  Avoid foods high in acid content ( tomatoes, citrus juices, spicy foods) . Avoid eating within 3 to 4 hours of lying down or before exercising. Do not over eat; try smaller more frequent meals. Elevate head of bed four inches when sleeping.  4)  Copy sent to : Dr. Trinna Post Plotnikov 5)  recall colonoscopy June 2015 6)  The medication list was reviewed and reconciled.  All changed / newly prescribed medications were explained.  A complete medication list was provided to the patient / caregiver. Prescriptions: NEXIUM 40 MG CPDR (ESOMEPRAZOLE MAGNESIUM) Take 1 tablet by mouth two times a day (please d/c all previous prescription for nexium or protonix patient may have on file!)  #180 x 2   Entered by:   Lamona Curl CMA (AAMA)   Authorized by:   Hart Carwin MD   Signed by:   Lamona Curl CMA (AAMA) on 04/25/2010   Method used:   Electronically to        [facility]* (mail-order)             ,          Ph: [phone]       Fax: [phone]   RxID:   [id number]

## 2011-01-23 NOTE — Medication Information (Signed)
Summary: Glucose & Diabetes Testing Supplies/[facility]  Glucose & Diabetes Testing Supplies/[facility]   Imported By: Sherian Rein 08/01/2009 14:37:12  _____________________________________________________________________  External Attachment:    Type:   Image     Comment:   External Document

## 2011-01-23 NOTE — Assessment & Plan Note (Signed)
Summary: 3 mth fu-stc   Vital Signs:  Patient Profile:   77 Years Old Female Height:     63 inches Weight:      219 pounds BMI:     38.93 Temp:     97.5 degrees F oral Pulse rate:   50 / minute BP sitting:   135 / 66  (left arm)  Vitals Entered By: Glendell Docker (May 06, 2008 9:21 AM)                 Chief Complaint:  FOLLOW UP DISEASE MANAGEMENT.  History of Present Illness: The patient presents for a follow up of hypertension, diabetes, hyperlipidemia Not feeling well. Achy all over, not feeling well. C/o fatigue. C/o boils  in groin. C/o Med costs    Current Allergies (reviewed today): ! ASPIRIN EC (ASPIRIN) ! CODEINE SULFATE (CODEINE SULFATE) ! DIOVAN (VALSARTAN) ! MOTRIN IB (IBUPROFEN)  Past Medical History:    Reviewed history from 02/06/2008 and no changes required:       Colonic polyps, hx of       Diabetes mellitus, type II       Diverticulitis, hx of       Gout       Hypertension       Osteoarthritis       Hyperlipidemia       Pulmonary HTN       L hydronephrosis       L adrenal adenoma 2006       GERD       Boils 2009   Family History:    Reviewed history from 11/06/2007 and no changes required:       Family History Diabetes 1st degree relative       Family History Hypertension  Social History:    Reviewed history from 11/06/2007 and no changes required:       Retired       Married       Never Smoked   Risk Factors:  PAP Smear History:     Date of Last PAP Smear:  05/06/2008    Results:  Declined-Hyst  Mammogram History:     Date of Last Mammogram:  09/11/2007    Results:  normal   Colonoscopy History:     Date of Last Colonoscopy:  05/26/2004    Results:  Done     Physical Exam  General:     Well-developed,well-nourished,in no acute distress; alert,appropriate and cooperative throughout examination Ears:     External ear exam shows no significant lesions or deformities.  Otoscopic examination reveals clear canals,  tympanic membranes are intact bilaterally without bulging, retraction, inflammation or discharge. Hearing is grossly normal bilaterally. Nose:     External nasal examination shows no deformity or inflammation. Nasal mucosa are pink and moist without lesions or exudates. Mouth:     Oral mucosa and oropharynx without lesions or exudates.  Teeth in good repair. Neck:     No deformities, masses, or tenderness noted. Lungs:     Normal respiratory effort, chest expands symmetrically. Lungs are clear to auscultation, no crackles or wheezes. Heart:     Normal rate and regular rhythm. S1 and S2 normal without gallop, murmur, click, rub or other extra sounds. Abdomen:     Bowel sounds positive,abdomen soft and non-tender without masses, organomegaly or hernias noted. Msk:     No deformity or scoliosis noted of thoracic or lumbar spine.   Pulses:     R and L  carotid,radial,femoral,dorsalis pedis and posterior tibial pulses are full and equal bilaterally Neurologic:     No cranial nerve deficits noted. Station and gait are normal. Plantar reflexes are down-going bilaterally. DTRs are symmetrical throughout. Sensory, motor and coordinative functions appear intact. Skin:     Intact without suspicious lesions or rashes Psych:     Oriented X3.      Impression & Recommendations:  Problem # 1:  BOILS, RECURRENT (ICD-680.9) Doxy  Problem # 2:  GERD (ICD-530.81) D/c fish oil Her updated medication list for this problem includes:    Omeprazole 20 Mg Cpdr (Omeprazole) .Marland Kitchen... Take 2 tabs daily   Problem # 3:  OSTEOARTHRITIS (ICD-715.90)  The following medications were removed from the medication list:    Vicodin 5-500 Mg Tabs (Hydrocodone-acetaminophen) .Marland Kitchen... As needed   Problem # 4:  HYPERTENSION (ICD-401.9)  The following medications were removed from the medication list:    Avapro 300 Mg Tabs (Irbesartan) ..... Once daily    Atenolol 50 Mg Tabs (Atenolol) ..... Once daily    Demadex 20 Mg  Tabs (Torsemide) .Marland Kitchen... 1 by mouth qd  Her updated medication list for this problem includes:    Verapamil Hcl Cr 180 Mg Cp24 (Verapamil hcl) ..... Once daily    Torsemide 20 Mg Tabs (Torsemide) .Marland Kitchen... Take 1 by mouth qd    Lisinopril 10 Mg Tabs (Lisinopril) .Marland Kitchen... Take 1 tab by mouth daily   Problem # 5:  FATIGUE (ICD-780.79) Reduce meds  Complete Medication List: 1)  Verapamil Hcl Cr 180 Mg Cp24 (Verapamil hcl) .... Once daily 2)  Allopurinol 300 Mg Tabs (Allopurinol) .... Once daily 3)  Amitriptyline Hcl 25 Mg Tabs (Amitriptyline hcl) .... Once daily 4)  Detrol La 2 Mg Cp24 (Tolterodine tartrate) .... Once daily 5)  Triamcinolone Acetonide 0.5 % Crea (Triamcinolone acetonide) .... Bid 6)  Torsemide 20 Mg Tabs (Torsemide) .... Take 1 by mouth qd 7)  Omeprazole 20 Mg Cpdr (Omeprazole) .... Take 2 tabs daily 8)  Vitamin D3 1000 Unit Tabs (Cholecalciferol) .Marland Kitchen.. 1 by mouth daily 9)  Eye Drops  .... Once daily 10)  Doxycycline Hyclate 100 Mg Caps (Doxycycline hyclate) .... Take 1 tab twice a day 11)  Metformin Hcl 500 Mg Tabs (Metformin hcl) .... Take 1 tab twice daily 12)  Lisinopril 10 Mg Tabs (Lisinopril) .... Take 1 tab by mouth daily   Patient Instructions: 1)  Please schedule a follow-up appointment in 3 months. 2)  BMP prior to visit, ICD-9: 3)  HbgA1C prior to visit, ICD-9: 401.1   Prescriptions: DOXYCYCLINE HYCLATE 100 MG CAPS (DOXYCYCLINE HYCLATE) Take 1 tab twice a day  #20 x 3   Entered and Authorized by:   Tresa Garter MD   Signed by:   Tresa Garter MD on 05/06/2008   Method used:   Print then Give to Patient   RxID:   [id number] METFORMIN HCL 500 MG TABS (METFORMIN HCL) Take 1 tab twice daily  #180 x 3   Entered and Authorized by:   Tresa Garter MD   Signed by:   Tresa Garter MD on 05/06/2008   Method used:   Print then Give to Patient   RxID:   [id number] LISINOPRIL 10 MG  TABS (LISINOPRIL) Take 1 tab by mouth daily  #90 x  3   Entered and Authorized by:   Tresa Garter MD   Signed by:   Tresa Garter MD on 05/06/2008   Method used:  Print then Give to Patient   RxID:   [id number] METFORMIN HCL 500 MG TABS (METFORMIN HCL) Take 1 tab twice daily  #180 x 3   Entered and Authorized by:   Tresa Garter MD   Signed by:   Tresa Garter MD on 05/06/2008   Method used:   Electronically sent to ...       [facility] [address] [phone]*       [address]       [place], [place]  [address]       Ph: [phone] or [phone]       Fax: [phone]   RxID:   [id number] DOXYCYCLINE HYCLATE 100 MG CAPS (DOXYCYCLINE HYCLATE) Take 1 tab twice a day  #20 x 3   Entered and Authorized by:   Tresa Garter MD   Signed by:   Tresa Garter MD on 05/06/2008   Method used:   Electronically sent to ...       [facility]  [address] [facility] [address] [phone]*       [address]       [place], [place]  [address]       Ph: [phone] or [phone]       Fax: [phone]   RxID:   [id number]  ] Current Allergies (reviewed today): ! ASPIRIN EC (ASPIRIN) ! CODEINE SULFATE (CODEINE SULFATE) ! DIOVAN (VALSARTAN) ! MOTRIN IB (IBUPROFEN)   Preventive Care Screening  Last Flu Shot:    Date:  05/06/2008    Results:  Declined  Pap Smear:    Date:  05/06/2008    Results:  Declined-Hyst  Mammogram:    Date:  09/11/2007    Results:  normal   Last Pneumovax:    Date:  06/26/2007    Results:  given   Last Tetanus Booster:    Date:  06/26/2007    Results:  given   Colonoscopy:    Date:  05/26/2004    Results:  Done

## 2011-01-23 NOTE — Medication Information (Signed)
Summary: Moist [facility]ating [facility]  [facility]   Imported By: Lester Saginaw 01/19/2009 11:55:04  _____________________________________________________________________  External Attachment:    Type:   Image     Comment:   External Document

## 2011-01-23 NOTE — Op Note (Signed)
Summary: Colectomy   Breanna [name], [name]                   ACCOUNT NO.:  [id number]   MEDICAL RECORD NO.:  [id number]          PATIENT TYPE:  INP   LOCATION:  5704                         FACILITY:  [facility]   PHYSICIAN:  Cherylynn Ridges, M.D.    DATE OF BIRTH:  1931-04-11   DATE OF PROCEDURE:  06/11/2005  DATE OF DISCHARGE:                                 OPERATIVE REPORT   PREOPERATIVE DIAGNOSIS:  Sigmoid diverticulitis with continued pain.   POSTOPERATIVE DIAGNOSIS:  Sigmoid diverticulitis with continued pain.   PROCEDURE:  1.  Sigmoid colectomy with primary anastomosis.  2.  Repair of cecal tear.   SURGEON:  Jimmye Norman, M.D.   ASSISTANT:  Gabrielle Dare. Janee Morn, M.D.   ANESTHESIA:  General endotracheal anesthesia.   ESTIMATED BLOOD LOSS:  300 mL.   COMPLICATIONS:  Cecal perforation.   CONDITION:  Fair.   INDICATIONS FOR PROCEDURE:  The patient is a 77 year old who has had  unrelenting problems with sigmoid diverticulitis since the end of May who  now comes in for semi-elective colectomy.   FINDINGS:  The patient had severe diverticulitis involving the sigmoid colon  with adherent cecum to the anterolateral aspect.  Dr. Brunilda Payor attempted a  preoperative cystoscopy with stent placement and was unsuccessful.  We did  not visualize the ureters, however, we stayed very close to the colon  throughout the case and no blood or problems of urine output were noted.   OPERATION:  The patient was taken to the operating room and placed on the  table in supine position.  After an adequate endotracheal anesthetic was  administered, she was prepped and draped in the usual sterile manner  exposing the midline of the abdomen.  We took the initial incision from just  above the umbilicus down to the pubic crest.  We took it down to and through  the midline fascia where we did encounter some adhesions from her previous  ureteral and kidney surgery.  I used Kocher clamps to tent up on the fascia  as  we took down the adhesions using Metzenbaum scissors.  We were able to do  so until we got into the free open peritoneal space, however, we did have to  continue to take down omental adhesions from the pelvis in order to free up  the distal portion of the colon which was seen to be the inflammatory area.  The point of inflammation was in the distal sigmoid colon and proximal  rectum.  The cecum, which was markedly distended and dilated, likely  indicating some type of distal obstruction, was plastered to this area of  inflammation.  As we were taking it off, it tore full thickness spilling a  small amount of liquid bowel prep to fecal material into the area.  This was  rapidly controlled and subsequently sutured after a Satinsky clamp was  placed on the bowel, and then sutured with 2-0 silk sutures.  There was no  further spillage from that area.  We repaired the cecal tear and perforation  using silk sutures and then  we subsequently mobilized it away from the area,  but it remained markedly distended during most of the case.  It turns out  that the entire colon up to the area of the sigmoid inflammation was  markedly dilated and it seems as though this inflammatory area was the site  of, at least, high grade partial colonic obstruction.  We continued to  mobilize the sigmoid colon and actually had to mobilize the proximal  descending colon up to and proximal to the splenic flexure in order to get  adequate mobilization for our subsequent anastomosis.  We transected the  distal descending colon, proximal sigmoid, with a TA-90 blue stapler and  then subsequently took down the mesentery of this sigmoid colon which was  plastered posterior and laterally using Kelly clamps and 2-0 silk ties.  We  stayed very close to the sigmoid colon during this case to make sure that  the ureters, which could not be seen, but indigo carmine had bene injected,  and were not injured.  Distally, we used a  reticulating 60-TA stapler to  come across the distal colon and subsequently took the mesentery between the  two transected ends.  We sent the specimen off and opened it off the field  because this felt very much like a carcinoma, however, it was all  inflammatory area outside the bowel and no evidence of any mucosal lesion.  We copiously irrigated leaving the distal stump in place and then we  mobilized the proximal colon.  We made a purse-string suture at the staple  line of the proximal portion of the colon and through that, aspirated a  large amount of the feculent material out of the colon prior to the  anastomosis.  With this, about a liter of feculent bowel prep fluid was  taken out.  We chose a 29 mm Stealth EEA stapler to perform the anastomosis.  The anvil was placed with a 2-0 Prolene purse-string suture in the proximal  colon after the suture line was transected.  The assistant surgeon passed  the handle through the anus up into the area of the distal rectum.  We were  able to perform an end-to-side anastomosis as the distal portion was just  anterior to the staple line and completely avoided the staple line of the  reticulating stapler.  The anastomosis was done and we tested it for any  leak with the sigmoidoscope and also gas insufflation and none were noted.  However, it was noted upon inspecting the ring from the EEA stapler that the  proximal ring was not complete.  We went back to reinspect the anastomosis  and there was noted to be a leaking anastomosis posterolaterally on the  right side which was oversewn with about 4-5 silk sutures.  This was done  with a spring clamp in place and once this was done, the spring was left off  and there was no further leakage.  We reinforced the cecal area also with 2-  0 silk and subsequently copiously irrigated with saline and then closed.  The fascia was closed using a running #1 PDS suture.  The skin was closed with staples noting  that there is a chance of infection because of the cecal  spillage.       JOW/MEDQ  D:  06/11/2005  T:  06/11/2005  Job:  [id number]

## 2011-01-23 NOTE — Procedures (Signed)
Summary: Gastroenterology EGD  Gastroenterology EGD   Imported By: Irma Newness 05/18/2008 11:55:31  _____________________________________________________________________  External Attachment:    Type:   Image     Comment:   External Document

## 2011-01-23 NOTE — Progress Notes (Signed)
Summary: PA OMEPRAZOLE  Phone Note From Pharmacy   Caller: [phone] Summary of Call: Pt's omeprazole requires PA. Proceed?? Initial call taken by: Lamar Sprinkles,  May 19, 2008 6:34 PM  Follow-up for Phone Call        OK, Thx, Follow-up by: Tresa Garter MD,  May 20, 2008 8:57 AM  Additional Follow-up for Phone Call Additional follow up Details #1::        Called [facility] to obtain PA, case #[id number] per [facility] pt now approved to 05/28/2009 Additional Follow-up by: Rock Nephew CMA,  May 28, 2008 1:40 PM

## 2011-01-23 NOTE — Assessment & Plan Note (Signed)
Summary: LOA,FATIGUE-LB   Vital Signs:  Patient profile:   77 year old female Height:      64 inches Weight:      189.50 pounds BMI:     32.65 O2 Sat:      97 % on Room air Temp:     101.7 degrees F oral Pulse rate:   89 / minute BP sitting:   150 / 70  (left arm) Cuff size:   large  Vitals Entered By: Lucious Groves (May 12, 2010 10:35 AM)  O2 Flow:  Room air CC: C/O fatigue, temperature changes, and loss of appetite./kb Is Patient Diabetic? Yes Pain Assessment Patient in pain? no      Comments Patient does have fever, but denies N&V. Also requests refill of Benzonatate./kb   Primary Care Provider:  Sula Soda, MD   CC:  C/O fatigue, temperature changes, and and loss of appetite./kb.  History of Present Illness: C/o chills x 2 d, tired. C/o HA, core all over. C/o frequent urination too.  Current Medications (verified): 1)  Torsemide 20 Mg  Tabs (Torsemide) .... Take 1 By Mouth Qd 2)  Allopurinol 300 Mg  Tabs (Allopurinol) .Marland Kitchen.. 1 Once Daily 3)  Amitriptyline Hcl 25 Mg  Tabs (Amitriptyline Hcl) .Marland Kitchen.. 1 Once Daily At [facility] Prn 4)  Triamcinolone Acetonide 0.5 % Crea (Triamcinolone Acetonide) .... Two Times A Day Prn 5)  Vitamin D3 2000 Unit Caps (Cholecalciferol) .... Take One By Mouth Once Daily 6)  Doxycycline Hyclate 100 Mg Caps (Doxycycline Hyclate) .... Take 1 Tab Twice A Day X 7 D Prn 7)  Metformin Hcl 500 Mg Tabs (Metformin Hcl) .... Take 1 Tab Twice Daily 8)  Bactroban 2 %  Oint (Mupirocin) .... Use Qid Prn 9)  Coreg 25 Mg Tabs (Carvedilol) .Marland Kitchen.. 1 By Mouth Two Times A Day 10)  Eye Drops .... Once Daily 11)  Amlodipine Besylate 5 Mg Tabs (Amlodipine Besylate) .Marland Kitchen.. 1 By Mouth Qd 12)  Benzonatate 200 Mg Caps (Benzonatate) .Marland Kitchen.. 1 By Mouth Three Times A Day As Needed Cough 13)  Terazol 7 0.4 % Crea (Terconazole) .... Use Pv X 7 D As Needed Hs 14)  Aspirin 81 Mg  Tbec (Aspirin) .... One By Mouth Every Day 15)  Spironolactone 50 Mg Tabs (Spironolactone) .Marland Kitchen.. 1 By Mouth  Qam For Blood Pressure 16)  Aloe Vera .... Take One By Mouth Once Daily 17)  Levsin/sl 0.125 Mg Subl (Hyoscyamine Sulfate) .... Take 1 Under Tongue Every 4-6 Hours As Needed For Pain and Spasms 18)  Nexium 40 Mg  Cpdr (Esomeprazole Magnesium) .Marland Kitchen.. 1 Capsule Twice A Day 30 Minutes Before Meals  Allergies (verified): 1)  ! Aspirin Ec (Aspirin) 2)  ! Codeine Sulfate (Codeine Sulfate) 3)  ! Diovan (Valsartan) 4)  ! Motrin Ib (Ibuprofen) 5)  Lisinopril (Lisinopril) 6)  Verapamil 7)  Hydrochlorothiazide 8)  Atenolol  Past History:  Past Medical History: Last updated: 04/10/2010 Colonic polyps, hx of Diabetes mellitus, type II Diverticulitis, hx of Gout Hypertension Osteoarthritis Hyperlipidemia Pulmonary HTN L hydronephrosis L adrenal adenoma 2006 GERD w/esophaageal spasm 2011 Boils 2009 Cerebrovascular accident, cerebellar 2010 Low back pain cholelithiasis asympt. with a nl HIDA 4/11  Social History: Last updated: 06/14/2009 Retired Married Never Smoked Alcohol Use - no Illicit Drug Use - no  Review of Systems  The patient denies fever, dyspnea on exertion, prolonged cough, and abdominal pain.         Chills, weakness  Physical Exam  General:  NAD, looks  tired, alert, oriented and very pleasant. Nose:  External nasal examination shows no deformity or inflammation. Nasal mucosa are pink and moist without lesions or exudates. Mouth:  coated tongue, dryish Lungs:  Normal respiratory effort, chest expands symmetrically. Lungs are clear to auscultation, no crackles or wheezes. Heart:  clear breath sounds. Normal S1, normal S2. Abdomen:  obese soft abdomen without tenderness. Well-healed surgical scars in the lower abdomen. Liver edge at costal margin. Msk:  R wrist is tender w/ROM w/some swelling, no crepitus Neurologic:  No cranial nerve deficits noted. Station and gait are normal. Plantar reflexes are down-going bilaterally. DTRs are symmetrical throughout. Sensory,  motor and coordinative functions appear intact. Steady gait Skin:  8x7 mm furuncle on L pubic area, no fluctuation or d/c Psych:  Oriented X3.     Impression & Recommendations:  Problem # 1:  UTI (ICD-599.0) Assessment New Rocephin 1 g IM Her updated medication list for this problem includes:    Doxycycline Hyclate 100 Mg Caps (Doxycycline hyclate) .Marland Kitchen... Take 1 tab twice a day x 7 d prn    Ciprofloxacin Hcl 500 Mg Tabs (Ciprofloxacin hcl) .Marland Kitchen... 1 by mouth bid  Problem # 2:  FEVER UNSPECIFIED (ICD-780.60)/ chills due to #1 Assessment: New  Orders: TLB-Udip ONLY (81003-UDIP) TLB-CBC Platelet - w/Differential (85025-CBCD) TLB-BMP (Basic Metabolic Panel-BMET) (80048-METABOL) Rocephin  250mg  (Z6109) Admin of Therapeutic Inj  intramuscular or subcutaneous (60454)  Problem # 3:  LOW BACK PAIN (ICD-724.2) Assessment: Unchanged  Her updated medication list for this problem includes:    Aspirin 81 Mg Tbec (Aspirin) ..... One by mouth every day  Complete Medication List: 1)  Torsemide 20 Mg Tabs (Torsemide) .... Take 1 by mouth qd 2)  Allopurinol 300 Mg Tabs (Allopurinol) .Marland Kitchen.. 1 once daily 3)  Amitriptyline Hcl 25 Mg Tabs (Amitriptyline hcl) .Marland Kitchen.. 1 once daily at hs prn 4)  Triamcinolone Acetonide 0.5 % Crea (Triamcinolone acetonide) .... Two times a day prn 5)  Vitamin D3 2000 Unit Caps (Cholecalciferol) .... Take one by mouth once daily 6)  Doxycycline Hyclate 100 Mg Caps (Doxycycline hyclate) .... Take 1 tab twice a day x 7 d prn 7)  Metformin Hcl 500 Mg Tabs (Metformin hcl) .... Take 1 tab twice daily 8)  Bactroban 2 % Oint (Mupirocin) .... Use qid prn 9)  Coreg 25 Mg Tabs (Carvedilol) .Marland Kitchen.. 1 by mouth two times a day 10)  Eye Drops  .... Once daily 11)  Amlodipine Besylate 5 Mg Tabs (Amlodipine besylate) .Marland Kitchen.. 1 by mouth qd 12)  Benzonatate 200 Mg Caps (Benzonatate) .Marland Kitchen.. 1 by mouth three times a day as needed cough 13)  Terazol 7 0.4 % Crea (Terconazole) .... Use pv x 7 d as needed  hs 14)  Aspirin 81 Mg Tbec (Aspirin) .... One by mouth every day 15)  Spironolactone 50 Mg Tabs (Spironolactone) .Marland Kitchen.. 1 by mouth qam for blood pressure 16)  Aloe Vera  .... Take one by mouth once daily 17)  Levsin/sl 0.125 Mg Subl (Hyoscyamine sulfate) .... Take 1 under tongue every 4-6 hours as needed for pain and spasms 18)  Nexium 40 Mg Cpdr (Esomeprazole magnesium) .Marland Kitchen.. 1 capsule twice a day 30 minutes before meals 19)  Ciprofloxacin Hcl 500 Mg Tabs (Ciprofloxacin hcl) .Marland Kitchen.. 1 by mouth bid  Patient Instructions: 1)  Call if you are not better in a reasonable amount of time or if worse. Go to ER if feeling really bad! Prescriptions: BENZONATATE 200 MG CAPS (BENZONATATE) 1 by mouth three times  a day as needed cough  #90 x 1   Entered and Authorized by:   Tresa Garter MD   Signed by:   Tresa Garter MD on 05/12/2010   Method used:   [facility]        [facility]  [address] [facility] [address] [phone]* (retail)       [address]       [place], [place]  [address]       Ph: [phone] or [phone]       Fax: [phone]   RxID:   [id number] CIPROFLOXACIN HCL 500 MG TABS (CIPROFLOXACIN HCL) 1 by mouth bid  #20 x 1   Entered and Authorized by:   Tresa Garter MD   Signed by:   Tresa Garter MD on 05/12/2010   Method used:   [facility] to        [facility]  [address] [phone]* (retail)       [address]       [place], [place]  [address]       Ph: [phone] or [phone]       Fax: [phone]   RxID:   [id number]    Medication Administration  Injection # 1:    Medication: Rocephin  250mg     Diagnosis: FEVER UNSPECIFIED (ICD-780.60)    Route: IM    Site: RUOQ gluteus    Exp Date: 11/24/2011    Lot #: [id number]    Mfr: Novaplus    Patient tolerated injection without complications    Given by: Lucious Groves (May 12, 2010 11:23 AM)  Orders Added: 1)  TLB-Udip ONLY [81003-UDIP] 2)  TLB-CBC  Platelet - w/Differential [85025-CBCD] 3)  TLB-BMP (Basic Metabolic Panel-BMET) [80048-METABOL] 4)  Rocephin  250mg  [J0696] 5)  Admin of Therapeutic Inj  intramuscular or subcutaneous [96372] 6)  Est. Patient Level IV [50093]

## 2011-01-23 NOTE — Progress Notes (Signed)
Summary: med refill  Phone Note Refill Request Message from:  Fax from Pharmacy on June 09, 2010 2:14 PM  Refills Requested: Medication #1:  TERAZOL 7 0.4 % CREA use pv x 7 d as needed hs Initial call taken by: Lucious Groves,  June 09, 2010 2:14 PM  Follow-up for Phone Call        ok 3 ref Follow-up by: Tresa Garter MD,  June 12, 2010 7:34 AM  Additional Follow-up for Phone Call Additional follow up Details #1::        done. Additional Follow-up by: Lucious Groves,  June 12, 2010 8:12 AM

## 2011-01-23 NOTE — Assessment & Plan Note (Signed)
Summary: 3 mos f/u  $50  cd   Vital Signs:  Patient Profile:   77 Years Old Female Height:     63 inches Weight:      204 pounds Temp:     97.1 degrees F oral Pulse rate:   56 / minute BP sitting:   164 / 80  (left arm)  Vitals Entered By: Tora Perches (November 16, 2008 8:20 AM)                 Chief Complaint:  Multiple medical problems or concerns.    Current Allergies (reviewed today): ! ASPIRIN EC (ASPIRIN) ! CODEINE SULFATE (CODEINE SULFATE) ! DIOVAN (VALSARTAN) ! MOTRIN IB (IBUPROFEN) LISINOPRIL (LISINOPRIL)  Past Medical History:    Reviewed history from 02/06/2008 and no changes required:       Colonic polyps, hx of       Diabetes mellitus, type II       Diverticulitis, hx of       Gout       Hypertension       Osteoarthritis       Hyperlipidemia       Pulmonary HTN       L hydronephrosis       L adrenal adenoma 2006       GERD       Boils 2009   Family History:    Reviewed history from 11/06/2007 and no changes required:       Family History Diabetes 1st degree relative       Family History Hypertension  Social History:    Reviewed history from 11/06/2007 and no changes required:       Retired       Married       Never Smoked     Physical Exam  General:     overweight-appearing.   Eyes:     No corneal or conjunctival inflammation noted. EOMI. Perrla. Funduscopic exam benign, without hemorrhages, exudates or papilledema. Vision grossly normal. Mouth:     Oral mucosa and oropharynx without lesions or exudates.  Teeth in good repair. Neck:     No deformities, masses, or tenderness noted. Lungs:     Normal respiratory effort, chest expands symmetrically. Lungs are clear to auscultation, no crackles or wheezes. Heart:     Normal rate and regular rhythm. S1 and S2 normal without gallop, murmur, click, rub or other extra sounds. Abdomen:     Bowel sounds positive,abdomen soft and non-tender without masses, organomegaly or hernias  noted. Msk:     No deformity or scoliosis noted of thoracic or lumbar spine.   Extremities:     No clubbing, cyanosis, edema, or deformity noted with normal full range of motion of all joints.   Neurologic:     No cranial nerve deficits noted. Station and gait are normal. Plantar reflexes are down-going bilaterally. DTRs are symmetrical throughout. Sensory, motor and coordinative functions appear intact. Skin:     8x7 mm furuncle on L pubic area, no fluctuation or d/c Psych:     Oriented X3.      Impression & Recommendations:  Problem # 1:  HYPERTENSION (ICD-401.9) Assessment: Unchanged  The following medications were removed from the medication list:    Lisinopril 10 Mg Tabs (Lisinopril) .Marland Kitchen... Take 1 tab by mouth daily    Verapamil Hcl Cr 180 Mg Cp24 (Verapamil hcl) ..... Once daily  Her updated medication list for this problem includes:    Torsemide 20  Mg Tabs (Torsemide) .Marland Kitchen... Take 1 by mouth qd    Verapamil Hcl Cr 240 Mg Tbcr (Verapamil hcl) .Marland Kitchen... 1 by mouth daily   Problem # 2:  DIABETES MELLITUS, TYPE II (ICD-250.00) Assessment: Improved Lost wt The labs were reviewd with the patient.  The following medications were removed from the medication list:    Lisinopril 10 Mg Tabs (Lisinopril) .Marland Kitchen... Take 1 tab by mouth daily  Her updated medication list for this problem includes:    Metformin Hcl 500 Mg Tabs (Metformin hcl) .Marland Kitchen... Take 1 tab twice daily   Problem # 3:  GERD (ICD-530.81) Assessment: Unchanged  Her updated medication list for this problem includes:    Omeprazole 20 Mg Cpdr (Omeprazole) .Marland Kitchen... Take 2 tabs daily   Problem # 4:  BOILS, RECURRENT (ICD-680.9) Assessment: Comment Only Doxy prn  Problem # 5:  COUGH (ICD-786.2) Assessment: New D/c Lisinopril  Problem # 6:  OSTEOARTHRITIS (ICD-715.90) Assessment: Comment Only  Complete Medication List: 1)  Torsemide 20 Mg Tabs (Torsemide) .... Take 1 by mouth qd 2)  Allopurinol 300 Mg Tabs (Allopurinol)  .... Once daily 3)  Amitriptyline Hcl 25 Mg Tabs (Amitriptyline hcl) .... Once daily 4)  Detrol La 2 Mg Cp24 (Tolterodine tartrate) .... Once daily 5)  Triamcinolone Acetonide 0.5 % Crea (Triamcinolone acetonide) .... Bid 6)  Omeprazole 20 Mg Cpdr (Omeprazole) .... Take 2 tabs daily 7)  Vitamin D3 1000 Unit Tabs (Cholecalciferol) .Marland Kitchen.. 1 by mouth daily 8)  Doxycycline Hyclate 100 Mg Caps (Doxycycline hyclate) .... Take 1 tab twice a day x 7 d prn 9)  Metformin Hcl 500 Mg Tabs (Metformin hcl) .... Take 1 tab twice daily 10)  Bactroban 2 % Oint (Mupirocin) .... Use qid 11)  Eye Drops  .... Once daily 12)  Verapamil Hcl Cr 240 Mg Tbcr (Verapamil hcl) .Marland Kitchen.. 1 by mouth daily 13)  Hibiclens 4 % Liqd (Chlorhexidine gluconate) .... Use qd   Patient Instructions: 1)  Stop LIsinopril! 2)  Please schedule a follow-up appointment in 3 months. 3)  BMP prior to visit, ICD-9: 250.00 4)  HbgA1C prior to visit, ICD-9:   Prescriptions: HIBICLENS 4 % LIQD (CHLORHEXIDINE GLUCONATE) use qd  #QS 3 months x 4   Entered and Authorized by:   Tresa Garter MD   Signed by:   Tresa Garter MD on 11/16/2008   Method used:   Print then Give to Patient   RxID:   [id number] VERAPAMIL HCL CR 240 MG TBCR (VERAPAMIL HCL) 1 by mouth daily  #90 x 0   Entered and Authorized by:   Tresa Garter MD   Signed by:   Tresa Garter MD on 11/16/2008   Method used:   Print then Give to Patient   RxID:   [id number]  ]

## 2011-01-23 NOTE — Discharge Summary (Signed)
Summary: Diverticulitis   NAME:  Breanna [name], Breanna [name] NO.:  [id number]   MEDICAL RECORD NO.:  [id number]          PATIENT TYPE:  INP   LOCATION:  3041                         FACILITY:  [facility]   PHYSICIAN:  Breanna [name], M.D. [facility]  DATE OF BIRTH:  May 28, 1931   DATE OF ADMISSION:  05/18/2005  DATE OF DISCHARGE:  05/22/2005                                 DISCHARGE SUMMARY   ADMITTING DIAGNOSES:  1. Sigmoid diverticulitis, recurrent.  2. Diabetes mellitus type 2.  3. History of iron deficiency anemia.  4. Status post colonoscopy June 22, 2004 revealing sigmoid diverticulitis.      This test performed by Dr. Lina [name].  5. History of left ureteral obstruction January 2005. Status post      cystoscopy, left ureteroscopy and pyelogram revealing completely      obstructed ureter performed by Dr. Patsi [name].  6. History of remote bilateral hydronephrosis with some undetermined      surgical intervention in the 1970s.  7. Hypertension.  8. A history of spinal stenosis, status post lumbar laminectomy by Dr.      Franky [name] in October 2004.  9. History gastric duodenitis.  10.Status post hysterectomy with a history of benign tumor.  11.Status post bunion surgery, bilaterally.  12.Left adrenal nodule noted at July 2005 CT scan.  13.Borderline chest and portocaval adenopathy noted on July 2005 CT.   Status post left total knee arthroplasty in April of 2003 by Dr. Montez [name].  14. Status post cataract surgery, the patient believes this was bilateral.  15. History of gout.     ALLERGIES:  Multiple medication allergies including ASPIRIN, CODEINE,  DARVON, IBUPROFEN, HYDROCHLOROTHIAZIDE and probably to DARVOCET N 100 as  well.   DISCHARGE DIAGNOSES:  1. Recurrent sigmoid diverticulitis, symptomatically and clinically      improved. The patient may require surgical resection of the sigmoid      colon as this is one of many bouts with sigmoid diverticulitis within      the last 10  months.  2. History of ureteral obstruction, this was again confirmed by CT scan      performed in the outpatient setting at [facility], the      cardiology location. Urinalysis performed was negative for any active      infection urine cultures were negative.  3. Normocytic anemia for which she is taking chronic iron supplements.  4. Diabetes mellitus type 2 controlled with oral agents.     DISCHARGE DIAGNOSES CONTINUED:  1. Sigmoid diverticulitis, improved.  2. Episodic dizziness and presyncope as an outpatient not active during      this admission but may need follow up for adjustment of blood pressure      meds per her primary care doctor.  3. Chronic constipation managed with stool softeners and p.r.n. laxatives.  4. Diabetes mellitus type 2 pretty well controlled during this admission.  5. Multiple drug allergies as noted above and to this is added a new      medication Darvocet N 100 which has been causing itching lately for  this patient.     BRIEF HISTORY:  [name] is a very nice 77 year old African-American lady  who has had sigmoid diverticulitis in the past. She has had at least 4  courses of antibiotics over the last 10 months. She was hospitalized in July  of 2005 for IV antibiotic treatment of diverticulitis, and this was  following a colonoscopy confirming sigmoid diverticulitis in late June 2005.  Since then, she has had about 3 courses of outpatient antibiotics. The most  recent of which was started about 10 days prior to this admission. She had  had a CT scan March 02, 2005; again, following a course of antibiotics, that  revealed mild sigmoid diverticulitis; and then, last week, she was placed on  oral Flagyl and Cipro for both symptoms felt consistent with recurrent  diverticulitis which are pain in the lower abdomen especially in the left  lower abdomen. These symptoms are not accompanied by any nausea, vomiting,  but she does affirm that she  has been anorexic. She was not improving with  the antibiotics plus Darvocet for pain control, an outpatient CT scan was  performed which revealed sigmoid diverticulitis without abscess and a left  ureteral obstruction which is an old problem for her.   Therefore she was contacted and told to come over to the hospital for direct  admission for inpatient management specifically to allow [place] to give her IV  antibiotics. The patient also states that after using Darvocet the last few  times that she has experienced itching on her arms though no rash has broken  out and she is pretty sure that it is the Darvocet that is causing the  itching. The patient denied any fevers or rigors.  She had had about a 40  pound weight loss within the last 10-12 months. She also complained of some  urinary frequency along with stable bladder incontinence.   CONSULTATIONS:  None.   PROCEDURES:  None.   LABORATORIES:  Urinalysis was negative, essentially, except for cloudy  appearance to the urine. Urine cultures revealed no growth on final  analysis. CBC with hemoglobin of 10, hematocrit 30.5, MCV of 88.4. Platelets  of 215,000. White blood cell count of 8.2. There was a slight left shift  with increased neutrophils to 80%, but all other white cell lines were  within normal limits. Chemistries were unremarkable except for a low total  protein at 5.8 and albumin of 2.6. Her BUN was 15, creatinine 1.1, and  glucose 92. All transaminases and liver tests were within normal limits.  Imaging studies: No repeat imaging studies were performed during this  admission.   HOSPITAL COURSE:  The patient was admitted early Friday evening, May 26. She  was in stable condition, though of feeling a little bit tired and weak and  was in some discomfort. On abdominal palpation. She had tenderness in the  left lower quadrant, but no guarding or rebound and bowel sounds were active. She was not tachycardia and blood pressure was  142/79. We started  her on IV ciprofloxacin and on IV metronidazole.  She was to allowed p.r.n.  Demerol, but later this was changed to Dilaudid and the Phenergan was  available, though she did not become nauseous. Initial diet allowed was full  liquids.   The patient was stable throughout the hospitalization. Slowly but surely,  the abdominal pain subsided though it did not completely abandon her.  It  was quite tolerable. Eventually she was converted to oral pain  control with  Vicodin; and toward the end of hospitalization, she did not require  significant amounts of this medication. She was afebrile.   At times her blood pressure did dip slightly low with the lowest reading  being 93/59. This may be important because the patient describes some  intermittent symptoms; she had been experiencing of late, where when she  stood up, she would feel a little bit dizzy and feel like she needed to sit  down. However, if she was able to stand for a while these symptoms would  pass.  It is possible, given multiple antihypertensive type medications,  that she may need some adjustment of these in the future if it is determined  that her blood pressure as the cause. However, she was seen in the emergency  room on April 19 for these complaints at which time blood pressures were  stable and blood sugar was 111. She has not raised this issue with her  primary care physician, however.   By the day of discharge which was hospital day 4+ for her, she was improved  to the point where she was comfortable without narcotics; was having normal  type bowel movements; and was clinically stable for discharge. The day prior  to discharge she had been converted over to oral agents, oral antibiotics of  Cipro and Flagyl, and she was allowed a lactose free diet which she was  tolerating.   This is one of numerous times this patient has required antibiotics for  sigmoid diverticulitis; and it is almost a picture  where she has a chronic  diverticulitis which flares acutely from time to time. At this point, it may  be in her best interests to consider sigmoid resection as a therapy. The  patient was a bit reluctant to consider this, but is reasonable. Therefore,  an outpatient appointment for her to discuss surgical options will be made  for this patient. She has seen Dr. Leonie Man in the past; and we will  make an appointment for her to see him in the near future. Otherwise, she is  to follow up with Dr. Victorino Dike on Monday June 5. She also has an  appointment with Dr. Posey Rea on June 13 to discuss her episodes of  weakness and dizziness. In the meantime, the only medication adjustment that  was made was to stop her hyoscyamine as this is an anticholinergic and  probably should not be used, in this patient who has glaucoma.   MEDICATIONS AT DISCHARGE:  1. Verapamil 360 mg daily.  2. Niferex twice daily.  3. Protonix 40 mg daily. 4. Potassium chloride 20 mEq twice daily.  5. Allopurinol 300 mg daily.  6. Torsemide 20 mg daily.  7. Actos 40 mg daily.  8. Atenolol 25 mg daily.  9. Avapro 300 mg daily.  10.Fish oil capsules once daily.  11.Senior multivitamin once daily.  12.Eye drops include:  Alphagan 5 mL to the right eye twice daily, timolol      0.5% to both eyes twice daily and ocular LS to left eye twice daily.  13.Stool softeners, Colace, 1-2 times a day.  14.Milk of magnesia as needed for constipation p.r.n.  15.Ciprofloxacin 500 mg twice daily. She received a 10-day prescription      for this.  16.Metronidazole 500 mg 3x daily. She received a 10-day supply of this      medication as well.     DIET:  To be followed should be regular diabetic type  foods with resort to a  more liquid form of diet if she develops recurrent pain.      SG/MEDQ  D:  05/22/2005  T:  05/22/2005  Job:  [id number]   cc:   Leonie Man, M.D.  [address]  [address]  [place]  [place] [address]    Ulyess Mort, M.D. [facility]

## 2011-01-23 NOTE — Progress Notes (Signed)
Summary: Moist heating pad  Phone Note Call from Patient Call back at Home Phone [phone]   Summary of Call: Patient is requesting a call. Initial call taken by: Lamar Sprinkles,  November 03, 2008 11:58 AM  Follow-up for Phone Call        Pt spoke w/a med supply company. Do you think that she would benefit from a moist heating pad for her neuropathy? C/o pain, numbness & tingling. If Yes please sign form when it is faxed. Follow-up by: Lamar Sprinkles,  November 03, 2008 1:59 PM  Additional Follow-up for Phone Call Additional follow up Details #1::        She can try it. Usually a regular heating pad is working just fine Additional Follow-up by: Tresa Garter MD,  November 03, 2008 5:40 PM    Additional Follow-up for Phone Call Additional follow up Details #2::    An order will be faxed for dr's approval Follow-up by: Lamar Sprinkles,  November 04, 2008 8:22 AM

## 2011-01-23 NOTE — Assessment & Plan Note (Signed)
Summary: 3 mo rov/nws  Medications Added DOXYCYCLINE HYCLATE 100 MG CAPS (DOXYCYCLINE HYCLATE) .bid TRIAMCINOLONE ACETONIDE 0.5 % CREA (TRIAMCINOLONE ACETONIDE) bid        Vital Signs:  Patient Profile:   77 Years Old Female Weight:      218 pounds Temp:     98.1 degrees F oral Pulse rate:   60 / minute BP sitting:   169 / 75  (left arm)  Vitals Entered By: Tora Perches (November 06, 2007 9:05 AM)                 Chief Complaint:  Multiple medical problems or concerns.  History of Present Illness: The patient presents for a follow up of hypertension, diabetes, hyperlipidemia C/o wheezing at times, has a fever blister. More at night, some coloured sputum in am. C/o a boil on L lower buttock.  Current Allergies (reviewed today): ! ASPIRIN EC (ASPIRIN) ! CODEINE SULFATE (CODEINE SULFATE) ! DIOVAN (VALSARTAN) ! MOTRIN IB (IBUPROFEN)  Past Medical History:    Colonic polyps, hx of    Diabetes mellitus, type II    Diverticulitis, hx of    Gout    Hypertension    Osteoarthritis    Hyperlipidemia    Pulmonary HTN    L hydronephrosis    L adrenal adenoma 2006  Past Surgical History:    Colectomy    Hysterectomy    Total knee replacement L 2003    R rot cuff surg 08    Family History:    Family History Diabetes 1st degree relative    Family History Hypertension  Social History:    Retired    Married    Never Smoked   Risk Factors:  Tobacco use:  never    Physical Exam  General:     Well-developed,well-nourished,in no acute distress; alert,appropriate and cooperative throughout examination Nose:     External nasal examination shows no deformity or inflammation. Nasal mucosa are pink and moist without lesions or exudates. Mouth:     Oral mucosa and oropharynx without lesions or exudates.  Teeth in good repair. A 2mm blister on L lower lib Lungs:     Normal respiratory effort, chest expands symmetrically. Lungs are clear to auscultation, no  crackles or wheezes. Heart:     Normal rate and regular rhythm. S1 and S2 normal without gallop, murmur, click, rub or other extra sounds. Abdomen:     Bowel sounds positive,abdomen soft and non-tender without masses, organomegaly or hernias noted. Msk:     Lumbar-sacral spine is tender to palpation over paraspinal muscles and painfull with the ROM  Extremities:     No clubbing, cyanosis, edema, or deformity noted with normal full range of motion of all joints.   Neurologic:     No cranial nerve deficits noted. Station and gait are normal. Plantar reflexes are down-going bilaterally. DTRs are symmetrical throughout. Sensory, motor and coordinative functions appear intact. Skin:     Intact without suspicious lesions or rashes Psych:     Cognition and judgment appear intact. Alert and cooperative with normal attention span and concentration. No apparent delusions, illusions, hallucinations    Impression & Recommendations:  Problem # 1:  BRONCHITIS-ACUTE (ICD-466.0) Assessment: New  Her updated medication list for this problem includes:    Doxycycline Hyclate 100 Mg Caps (Doxycycline hyclate) .Marland KitchenMarland KitchenMarland KitchenMarland Kitchen two times a day Should help with a boil too CXR Her updated medication list for this problem includes:    Doxycycline Hyclate 100 Mg Caps (  Doxycycline hyclate) ..... Marland Kitchenbid  Orders: T-2 View CXR (71020TC)   Problem # 2:  DIABETES MELLITUS, TYPE II (ICD-250.00) Assessment: Improved  Her updated medication list for this problem includes:    Actos 45 Mg Tabs (Pioglitazone hcl) ..... Once daily    Avapro 300 Mg Tabs (Irbesartan) ..... Once daily  Labs Reviewed: HgBA1c: 6.3 (11/03/2007)   Creat: 0.9 (11/03/2007)      Problem # 3:  HYPERTENSION (ICD-401.9) Assessment: Unchanged  Her updated medication list for this problem includes:    Verapamil Hcl Cr 180 Mg Cp24 (Verapamil hcl) ..... Once daily    Atenolol 50 Mg Tabs (Atenolol) ..... Once daily    Furosemide 20 Mg Tabs  (Furosemide) ..... Once daily    Avapro 300 Mg Tabs (Irbesartan) ..... Once daily   Problem # 4:  HYPERLIPIDEMIA (ICD-272.4) Assessment: Unchanged  Problem # 5:  OSTEOARTHRITIS (ICD-715.90) Assessment: Unchanged  Her updated medication list for this problem includes:    Vicodin 5-500 Mg Tabs (Hydrocodone-acetaminophen) .Marland Kitchen... As needed   Complete Medication List: 1)  Verapamil Hcl Cr 180 Mg Cp24 (Verapamil hcl) .... Once daily 2)  Atenolol 50 Mg Tabs (Atenolol) .... Once daily 3)  Nexium 40 Mg Cpdr (Esomeprazole magnesium) .... Once daily 4)  Furosemide 20 Mg Tabs (Furosemide) .... Once daily 5)  Actos 45 Mg Tabs (Pioglitazone hcl) .... Once daily 6)  Avapro 300 Mg Tabs (Irbesartan) .... Once daily 7)  Allopurinol 300 Mg Tabs (Allopurinol) .... Once daily 8)  Amitriptyline Hcl 25 Mg Tabs (Amitriptyline hcl) .... Once daily 9)  Detrol La 2 Mg Cp24 (Tolterodine tartrate) .... Once daily 10)  Vicodin 5-500 Mg Tabs (Hydrocodone-acetaminophen) .... As needed 11)  Vitamin D3 1000 Unit Tabs (Cholecalciferol) .... Once daily 12)  Cod Liver Oil Caps (Cod liver oil) .... Once daily 13)  Mvi  .... Once daily 14)  Eye Drops  .... Once daily 15)  Doxycycline Hyclate 100 Mg Caps (Doxycycline hyclate) .... .bid 16)  Triamcinolone Acetonide 0.5 % Crea (Triamcinolone acetonide) .... Bid   Patient Instructions: 1)  Please schedule a follow-up appointment in 3 months. 2)  BMP prior to visit, ICD-9: 401.1 3)  TSH prior to visit, ICD-9: 4)  HbgA1C prior to visit, ICD-9: 250.00    Prescriptions: TRIAMCINOLONE ACETONIDE 0.5 % CREA (TRIAMCINOLONE ACETONIDE) bid  #45q x 1   Entered and Authorized by:   Tresa Garter MD   Signed by:   Tresa Garter MD on 11/06/2007   Method used:   Print then Give to Patient   RxID:   [id number] DOXYCYCLINE HYCLATE 100 MG CAPS (DOXYCYCLINE HYCLATE) .bid  #20 x 0   Entered and Authorized by:   Tresa Garter MD   Signed by:   Tresa Garter MD on 11/06/2007   Method used:   Print then Give to Patient   RxID:   [id number]  ]

## 2011-01-23 NOTE — Miscellaneous (Signed)
Summary: allopurinol   Clinical Lists Changes  Medications: Rx of ALLOPURINOL 300 MG  TABS (ALLOPURINOL) once daily;  #30 x 9;  Signed;  Entered by: Tora Perches;  Authorized by: Tresa Garter MD;  Method used: Electronically to [facility] [address] [phone]*, [address] [place], [place], [place]  [address], Ph: [phone] or [phone], Fax: [phone]    Prescriptions: ALLOPURINOL 300 MG  TABS (ALLOPURINOL) once daily  #30 x 9   Entered by:   Tora Perches   Authorized by:   Tresa Garter MD   Signed by:   Tora Perches on 10/27/2008   Method used:   [facility]ron[facility] to        [facility]  [address] [phone][facility] (retail)       [address]       [place], [place]  [address]       Ph: [phone] or [phone]       Fax: [phone]   RxID:   [id number]

## 2011-01-23 NOTE — Letter (Signed)
Summary: Approval for Additional Omeprazole/[facility]  Approval for Additional Omeprazole/[facility]   Imported By: Maryln Gottron 06/09/2008 14:05:10  _____________________________________________________________________  External Attachment:    Type:   Image     Comment:   External Document

## 2011-01-23 NOTE — Assessment & Plan Note (Signed)
Summary: leg pain/swelling/plot pt/SD   Vital Signs:  Patient profile:   77 year old female Height:      64 inches Weight:      191 pounds BMI:     32.90 O2 Sat:      97 % on Room air Temp:     97.0 degrees F oral Pulse rate:   78 / minute Pulse rhythm:   regular Resp:     16 per minute BP sitting:   130 / 78  (left arm) Cuff size:   regular  Vitals Entered By: Alysia Penna (October 02, 2010 2:09 PM)  Nutrition Counseling: Patient's BMI is greater than 25 and therefore counseled on weight management options.  O2 Flow:  Room air CC: pt c/o left leg swelling and pain. Feels tight. Began as feeling like small cramps. /cp sma, Hypertension Management   Primary Care Provider:  Sula Soda, MD   CC:  pt c/o left leg swelling and pain. Feels tight. Began as feeling like small cramps. /cp sma and Hypertension Management.  History of Present Illness: New to me she complains of pain in her left distal calf that started about 5 days ago after she had been moving some furniture. She did not have an injury and says that the pain started while she was lying in the bed. She describes the pain as an intermittent cramp that is worsened by activity. She does not have any swelling in her legs.  Hypertension History:      She complains of side effects from treatment, but denies headache, chest pain, palpitations, dyspnea with exertion, orthopnea, PND, peripheral edema, visual symptoms, neurologic problems, and syncope.  She notes the following problems with antihypertensive medication side effects: cramps and high K+.        Positive major cardiovascular risk factors include female age 2 years old or older, diabetes, hyperlipidemia, and hypertension.  Negative major cardiovascular risk factors include negative family history for ischemic heart disease and non-tobacco-user status.        Positive history for target organ damage include prior stroke (or TIA).  Further assessment for target organ  damage reveals no history of ASHD, cardiac end-organ damage (CHF/LVH), peripheral vascular disease, renal insufficiency, or hypertensive retinopathy.     Preventive Screening-Counseling & Management  Alcohol-Tobacco     Alcohol drinks/day: 0     Smoking Status: never     Tobacco Counseling: not indicated; no tobacco use  Clinical Review Panels:  Prevention   Last Mammogram:  ASSESSMENT: Negative - BI-RADS 1^MM DIGITAL SCREENING (09/22/2010)   Last Pap Smear:  Declined-Hyst (05/06/2008)   Last Colonoscopy:  Done (05/26/2004)  Immunizations   Last Tetanus Booster:  given (06/26/2007)   Last Flu Vaccine:  Fluvax 3+ (09/29/2010)   Last H1N1 Vaccine 1:  H1N1 vaccine G code (01/06/2009)   Last Pneumovax:  given (06/26/2007)  Lipid Management   Cholesterol:  157 (04/06/2010)   LDL (bad choesterol):  90 (04/06/2010)   HDL (good cholesterol):  52.10 (04/06/2010)   Triglycerides:  55 (11/21/2006)  Diabetes Management   HgBA1C:  7.8 (09/27/2010)   Creatinine:  1.3 (09/27/2010)   Last Foot Exam:  yes (09/29/2010)   Last Flu Vaccine:  Fluvax 3+ (09/29/2010)   Last Pneumovax:  given (06/26/2007)  CBC   WBC:  15.2 (05/12/2010)   RBC:  4.04 (05/12/2010)   Hgb:  12.1 (05/12/2010)   Hct:  36.3 (05/12/2010)   Platelets:  179.0 (05/12/2010)   MCV  90.0 (05/12/2010)  MCHC  33.3 (05/12/2010)   RDW  18.0 (05/12/2010)   PMN:  83.5 (05/12/2010)   Lymphs:  8.2 (05/12/2010)   Monos:  8.3 (05/12/2010)   Eosinophils:  0.0 (05/12/2010)   Basophil:  0.0 (05/12/2010)  Complete Metabolic Panel   Glucose:  136 (09/27/2010)   Sodium:  143 (09/27/2010)   Potassium:  5.6 (09/27/2010)   Chloride:  107 (09/27/2010)   CO2:  28 (09/27/2010)   BUN:  33 (09/27/2010)   Creatinine:  1.3 (09/27/2010)   Albumin:  3.7 (04/06/2010)   Total Protein:  7.5 (04/06/2010)   Calcium:  9.4 (09/27/2010)   Total Bili:  0.5 (04/06/2010)   Alk Phos:  101 (04/06/2010)   SGPT (ALT):  13 (04/06/2010)   SGOT (AST):   18 (04/06/2010)   Medications Prior to Update: 1)  Metformin Hcl 850 Mg Tabs (Metformin Hcl) .Marland Kitchen.. 1 By Mouth Bid 2)  Amlodipine Besylate 5 Mg Tabs (Amlodipine Besylate) .Marland Kitchen.. 1 By Mouth Qd 3)  Spironolactone 50 Mg Tabs (Spironolactone) .Marland Kitchen.. 1 By Mouth Qam For Blood Pressure 4)  Coreg 25 Mg Tabs (Carvedilol) .Marland Kitchen.. 1 By Mouth Two Times A Day 5)  Torsemide 20 Mg  Tabs (Torsemide) .... Take 1 By Mouth Qd 6)  Allopurinol 300 Mg  Tabs (Allopurinol) .Marland Kitchen.. 1 Once Daily 7)  Amitriptyline Hcl 25 Mg  Tabs (Amitriptyline Hcl) .Marland Kitchen.. 1 Once Daily At [facility] Prn 8)  Triamcinolone Acetonide 0.5 % Crea (Triamcinolone Acetonide) .... Two Times A Day Prn 9)  Vitamin D3 2000 Unit Caps (Cholecalciferol) .... Take One By Mouth Once Daily 10)  Doxycycline Hyclate 100 Mg Caps (Doxycycline Hyclate) .... Take 1 Tab Twice A Day X 7 D Prn 11)  Bactroban 2 %  Oint (Mupirocin) .... Use Qid Prn 12)  Terazol 7 0.4 % Crea (Terconazole) .... Use Pv X 7 D As Needed Hs 13)  Aspirin 81 Mg  Tbec (Aspirin) .... One By Mouth Every Day 14)  Levsin/sl 0.125 Mg Subl (Hyoscyamine Sulfate) .... Take 1 Under Tongue Every 4-6 Hours As Needed For Pain and Spasms 15)  Pantoprazole Sodium 40 Mg Tbec (Pantoprazole Sodium) .... Take 1 Tablet By Mouth Two Times A Day 16)  Pennsaid 1.5 % Soln (Diclofenac Sodium) .... 3-5 Gtt On Skin Three Times A Day For Pain 17)  Ibuprofen 600 Mg Tabs (Ibuprofen) .Marland Kitchen.. 1 By Mouth Bid  Pc X 1 Wk Then As Needed For  Pain 18)  Eye Drops .... Once Daily 19)  Aloe Vera .... Take One By Mouth Once Daily  Current Medications (verified): 1)  Metformin Hcl 850 Mg Tabs (Metformin Hcl) .Marland Kitchen.. 1 By Mouth Bid 2)  Amlodipine Besylate 5 Mg Tabs (Amlodipine Besylate) .Marland Kitchen.. 1 By Mouth Qd 3)  Coreg 25 Mg Tabs (Carvedilol) .Marland Kitchen.. 1 By Mouth Two Times A Day 4)  Torsemide 20 Mg  Tabs (Torsemide) .... Take 1 By Mouth Qd 5)  Allopurinol 300 Mg  Tabs (Allopurinol) .Marland Kitchen.. 1 Once Daily 6)  Amitriptyline Hcl 25 Mg  Tabs (Amitriptyline Hcl) .Marland Kitchen.. 1  [facility] Pr[facility] 7)  Triamcinolone Acetonide 0.5 % Crea (Triamcinolone Acetonide) .... Two Times A Day Prn 8)  Vitamin D3 2000 Unit Caps (Cholecalciferol) .... Take One By Mouth Once Daily 9)  Doxycycline Hyclate 100 Mg Caps (Doxycycline Hyclate) .... Take 1 Tab Twice A Day X 7 D Prn 10)  Bactroban 2 %  Oint (Mupirocin) .... Use Qid Prn 11)  Terazol 7 0.4 % Crea (Terconazole) .... Use Pv X  7 D As Needed Hs 12)  Aspirin 81 Mg  Tbec (Aspirin) .... One By Mouth Every Day 13)  Levsin/sl 0.125 Mg Subl (Hyoscyamine Sulfate) .... Take 1 Under Tongue Every 4-6 Hours As Needed For Pain and Spasms 14)  Pantoprazole Sodium 40 Mg Tbec (Pantoprazole Sodium) .... Take 1 Tablet By Mouth Two Times A Day 15)  Pennsaid 1.5 % Soln (Diclofenac Sodium) .... 3-5 Gtt On Skin Three Times A Day For Pain 16)  Ibuprofen 600 Mg Tabs (Ibuprofen) .Marland Kitchen.. 1 By Mouth Bid  Pc X 1 Wk Then As Needed For  Pain 17)  Eye Drops .... Once Daily 18)  Aloe Vera .... Take One By Mouth Once Daily  Allergies (verified): 1)  ! Aspirin Ec (Aspirin) 2)  ! Codeine Sulfate (Codeine Sulfate) 3)  ! Diovan (Valsartan) 4)  ! Motrin Ib (Ibuprofen) 5)  Lisinopril (Lisinopril) 6)  Verapamil 7)  Hydrochlorothiazide 8)  Atenolol  Past History:  Past Medical History: Last updated: 07/10/2010 Colonic polyps, hx of Diabetes mellitus, type II Diverticulitis, hx of Gout Hypertension Osteoarthritis Hyperlipidemia Pulmonary HTN L hydronephrosis L adrenal adenoma 2006 GERD w/esophaageal spasm 2011 Boils 2009 Cerebrovascular accident, cerebellar 2010 Low back pain cholelithiasis asympt. with a nl HIDA 4/11 Stress  Family History: Last updated: 03/27/2010 Family History Diabetes 1st degree relative: Family History Hypertension Family History of Heart Disease:  No FH of Colon Cancer: Family History of Prostate Cancer: father, maternal uncle Family History of Breast Cancer: daughter  Family History: Reviewed history from  03/27/2010 and no changes required. Family History Diabetes 1st degree relative: Family History Hypertension Family History of Heart Disease:  No FH of Colon Cancer: Family History of Prostate Cancer: father, maternal uncle Family History of Breast Cancer: daughter  Social History: Reviewed history from 06/14/2009 and no changes required. Retired Married Never Smoked Alcohol Use - no Illicit Drug Use - no  Review of Systems       The patient complains of weight gain.  The patient denies anorexia, weight loss, chest pain, syncope, dyspnea on exertion, peripheral edema, prolonged cough, headaches, hemoptysis, abdominal pain, hematuria, suspicious skin lesions, transient blindness, difficulty walking, enlarged lymph nodes, and angioedema.   CV:  Complains of leg cramps with exertion; denies chest pain or discomfort, fainting, fatigue, lightheadness, near fainting, palpitations, shortness of breath with exertion, and swelling of feet. MS:  Complains of muscle aches and cramps; denies joint pain, joint redness, joint swelling, loss of strength, muscle weakness, stiffness, and thoracic pain.  Physical Exam  General:  alert, well-developed, well-nourished, well-hydrated, appropriate dress, normal appearance, healthy-appearing, cooperative to examination, and good hygiene.   Head:  normocephalic, atraumatic, no abnormalities observed, and no abnormalities palpated.   Mouth:  Oral mucosa and oropharynx without lesions or exudates.  Teeth in good repair. Neck:  supple, full ROM, and no masses.   Lungs:  Normal respiratory effort, chest expands symmetrically. Lungs are clear to auscultation, no crackles or wheezes. Heart:  Normal rate and regular rhythm. S1 and S2 normal without gallop, murmur, click, rub or other extra sounds. Abdomen:  soft, non-tender, normal bowel sounds, no distention, no masses, no guarding, no rigidity, no rebound tenderness, no abdominal hernia, no inguinal hernia, no  hepatomegaly, and no splenomegaly.   Msk:  normal ROM, no joint tenderness, no joint swelling, no joint warmth, no redness over joints, no joint deformities, no joint instability, and no crepitation.   Pulses:  R and L carotid,radial,femoral,dorsalis pedis and posterior tibial pulses are full and  equal bilaterally Extremities:  No clubbing, cyanosis, edema, or deformity noted with normal full range of motion of all joints.   Neurologic:  No cranial nerve deficits noted. Station and gait are normal. Plantar reflexes are down-going bilaterally. DTRs are symmetrical throughout. Sensory, motor and coordinative functions appear intact. Skin:  Intact without suspicious lesions or rashes Cervical Nodes:  no anterior cervical adenopathy and no posterior cervical adenopathy.   Axillary Nodes:  no R axillary adenopathy and no L axillary adenopathy.   Inguinal Nodes:  no R inguinal adenopathy and no L inguinal adenopathy.   Psych:  Cognition and judgment appear intact. Alert and cooperative with normal attention span and concentration. No apparent delusions, illusions, hallucinations   Impression & Recommendations:  Problem # 1:  LEG PAIN, LEFT (ICD-729.5) her exam is normal with no edema, will order labs and doppler to look for DVT Orders: Venipuncture (84132) T-D-Dimer Fibrin Derivatives Quantitive ([id number]) TLB-BMP (Basic Metabolic Panel-BMET) (80048-METABOL) TLB-CK Total Only(Creatine Kinase/CPK) (82550-CK)  Problem # 2:  HYPERKALEMIA (ICD-276.7) Assessment: New  Orders: Venipuncture (66440) T-D-Dimer Fibrin Derivatives Quantitive [id number]) TLB-BMP (Basic Metabolic Panel-BMET) (80048-METABOL) TLB-CK Total Only(Creatine Kinase/CPK) (82550-CK)  Problem # 3:  HYPERTENSION (ICD-401.9) Assessment: Deteriorated  will stop spironolactone due to high K+ The following medications were removed from the medication list:    Spironolactone 50 Mg Tabs (Spironolactone) .Marland Kitchen... 1 by mouth qam for  blood pressure Her updated medication list for this problem includes:    Amlodipine Besylate 5 Mg Tabs (Amlodipine besylate) .Marland Kitchen... 1 by mouth qd    Coreg 25 Mg Tabs (Carvedilol) .Marland Kitchen... 1 by mouth two times a day    Torsemide 20 Mg Tabs (Torsemide) .Marland Kitchen... Take 1 by mouth qd  Orders: Venipuncture (87564) T-D-Dimer Fibrin Derivatives Quantitive [id number]) TLB-BMP (Basic Metabolic Panel-BMET) (80048-METABOL) TLB-CK Total Only(Creatine Kinase/CPK) (82550-CK)  BP today: 130/78 Prior BP: 128/78 (09/29/2010)  10 Yr Risk Heart Disease: 13 %  Labs Reviewed: K+: 5.6 (09/27/2010) Creat: : 1.3 (09/27/2010)   Chol: 157 (04/06/2010)   HDL: 52.10 (04/06/2010)   LDL: 90 (04/06/2010)   TG: 77.0 (04/06/2010)  Complete Medication List: 1)  Metformin Hcl 850 Mg Tabs (Metformin hcl) .Marland Kitchen.. 1 by mouth bid 2)  Amlodipine Besylate 5 Mg Tabs (Amlodipine besylate) .Marland Kitchen.. 1 by mouth qd 3)  Coreg 25 Mg Tabs (Carvedilol) .Marland Kitchen.. 1 by mouth two times a day 4)  Torsemide 20 Mg Tabs (Torsemide) .... Take 1 by mouth qd 5)  Allopurinol 300 Mg Tabs (Allopurinol) .Marland Kitchen.. 1 once daily 6)  Amitriptyline Hcl 25 Mg Tabs (Amitriptyline hcl) .Marland Kitchen.. 1 once daily at hs prn 7)  Triamcinolone Acetonide 0.5 % Crea (Triamcinolone acetonide) .... Two times a day prn 8)  Vitamin D3 2000 Unit Caps (Cholecalciferol) .... Take one by mouth once daily 9)  Doxycycline Hyclate 100 Mg Caps (Doxycycline hyclate) .... Take 1 tab twice a day x 7 d prn 10)  Bactroban 2 % Oint (Mupirocin) .... Use qid prn 11)  Terazol 7 0.4 % Crea (Terconazole) .... Use pv x 7 d as needed hs 12)  Aspirin 81 Mg Tbec (Aspirin) .... One by mouth every day 13)  Levsin/sl 0.125 Mg Subl (Hyoscyamine sulfate) .... Take 1 under tongue every 4-6 hours as needed for pain and spasms 14)  Pantoprazole Sodium 40 Mg Tbec (Pantoprazole sodium) .... Take 1 tablet by mouth two times a day 15)  Pennsaid 1.5 % Soln (Diclofenac sodium) .... 3-5 gtt on skin three times a day for pain 16)   Ibuprofen  600 Mg Tabs (Ibuprofen) .Marland Kitchen.. 1 by mouth bid  pc x 1 wk then as needed for  pain 17)  Eye Drops  .... Once daily 18)  Aloe Vera  .... Take one by mouth once daily  Hypertension Assessment/Plan:      The patient's hypertensive risk group is category C: Target organ damage and/or diabetes.  Her calculated 10 year risk of coronary heart disease is 13 %.  Today's blood pressure is 130/78.  Her blood pressure goal is < 130/80.  Patient Instructions: 1)  Please schedule a follow-up appointment in 2 weeks. 2)  It is important that you exercise regularly at least 20 minutes 5 times a week. If you develop chest pain, have severe difficulty breathing, or feel very tired , stop exercising immediately and seek medical attention. 3)  You need to lose weight. Consider a lower calorie diet and regular exercise.  4)  Check your Blood Pressure regularly. If it is above 130/80: you should make an appointment.

## 2011-01-23 NOTE — Progress Notes (Signed)
Summary: omeprazole  Phone Note Refill Request Message from:  Fax from Pharmacy on April 18, 2009 4:18 PM  Refills Requested: Medication #1:  OMEPRAZOLE 20 MG CPDR Take 2 tabs daily   Last Refilled: [facility] [phone]   Method Requested: Electronic Initial call taken by: Orlan Leavens,  April 18, 2009 4:18 PM      Prescriptions: OMEPRAZOLE 20 MG CPDR (OMEPRAZOLE) Take 2 tabs daily  #180 x 2   Entered by:   Orlan Leavens   Authorized by:   Tresa Garter MD   Signed by:   Orlan Leavens on 04/18/2009   Method used:   Elect[facility] to        [facility]  [address] [phone] (retail[facility]       [address]       [place], [place]  [id number]       Ph: [phone] or [phone]       Fax: [phone]   RxID:   [id number]

## 2011-01-23 NOTE — Progress Notes (Signed)
Summary: Boils  Phone Note Call from Patient   Summary of Call: Pt says that she is taking meds for boils but they are spreading to opposite side.  Initial call taken by: Lamar Sprinkles,  July 07, 2008 8:22 AM  Follow-up for Phone Call        Add Bactroban oint qid Rx OV if worse Follow-up by: Tresa Garter MD,  July 07, 2008 12:14 PM  Additional Follow-up for Phone Call Additional follow up Details #1::        Pt informed  Additional Follow-up by: Lamar Sprinkles,  July 07, 2008 2:28 PM    New/Updated Medications: BACTROBAN 2 %  OINT (MUPIROCIN) use qid   Prescriptions: BACTROBAN 2 %  OINT (MUPIROCIN) use qid  #30 g x 1   Entered and Authorized by:   Tresa Garter MD   Signed by:   Lamar Sprinkles on 07/07/2008   Method used:   Electronically sent to ...       [address]*       [address]       [place], [place]  [address]       Ph: [phone]       Fax: [phone]   RxID:   [id number]

## 2011-01-23 NOTE — Assessment & Plan Note (Signed)
Summary: fell injured right hand/vg   Vital Signs:  Patient profile:   77 year old female Weight:      191 pounds Temp:     97.4 degrees F oral Pulse rate:   60 / minute BP sitting:   172 / 80  (left arm)  Vitals Entered By: Tora Perches (November 25, 2009 10:22 AM) CC: fell and injured her right hand Is Patient Diabetic? No   Primary Care Provider:  Sonda Primes, MD  CC:  fell and injured her right hand.  History of Present Illness: Tripped and fell - hit R wrist on 11/28. The patient presents for a post-hospital ER visit for R wrist injury She just had a hammertoe surg and given by mouth Demerol (DR Raynald Kemp) The patient presents for a follow up of hypertension, diabetes, hyperlipidemia   Preventive Screening-Counseling & Management  Alcohol-Tobacco     Smoking Status: never  Allergies: 1)  ! Aspirin Ec (Aspirin) 2)  ! Codeine Sulfate (Codeine Sulfate) 3)  ! Diovan (Valsartan) 4)  ! Motrin Ib (Ibuprofen) 5)  Lisinopril (Lisinopril) 6)  Verapamil  Past History:  Past Medical History: Last updated: 09/07/2009 Colonic polyps, hx of Diabetes mellitus, type II Diverticulitis, hx of Gout Hypertension Osteoarthritis Hyperlipidemia Pulmonary HTN L hydronephrosis L adrenal adenoma 2006 GERD Boils 2009 Cerebrovascular accident, cerebellar 2010 Low back pain  Social History: Last updated: 06/14/2009 Retired Married Never Smoked Alcohol Use - no Illicit Drug Use - no  Physical Exam  General:  alert, oriented and very pleasant. Mouth:  no ulcers. Neck:  Supple; no masses or thyromegaly. Lungs:  Normal respiratory effort, chest expands symmetrically. Lungs are clear to auscultation, no crackles or wheezes. Heart:  clear breath sounds. Normal S1, normal S2. Abdomen:  obese soft abdomen without tenderness. Well-healed surgical scars in the lower abdomen. Liver edge at costal margin. Msk:  R wrist is tender w/ROM w/some swelling, no crepitus Neurologic:   No cranial nerve deficits noted. Station and gait are normal. Plantar reflexes are down-going bilaterally. DTRs are symmetrical throughout. Sensory, motor and coordinative functions appear intact. Steady gait Skin:  8x7 mm furuncle on L pubic area, no fluctuation or d/c Psych:  Oriented X3.     Impression & Recommendations:  Problem # 1:  WRIST PAIN (ICD-719.43)  R - contusion Assessment New Cont w/a splint. Ortho if not better  Problem # 2:  HYPERLIPIDEMIA (ICD-272.4) Assessment: Comment Only On prescription therapy   Problem # 3:  DIABETES MELLITUS, TYPE II (ICD-250.00) Assessment: Unchanged  Her updated medication list for this problem includes:    Metformin Hcl 500 Mg Tabs (Metformin hcl) .Marland Kitchen... Take 1 tab twice daily    Aspirin 81 Mg Tbec (Aspirin) ..... One by mouth every day  Problem # 4:  HYPERTENSION (ICD-401.9) Assessment: Comment Only Call if up at home Her updated medication list for this problem includes:    Torsemide 20 Mg Tabs (Torsemide) .Marland Kitchen... Take 1 by mouth qd    Coreg 25 Mg Tabs (Carvedilol) .Marland Kitchen... 1 by mouth two times a day    Amlodipine Besylate 5 Mg Tabs (Amlodipine besylate) .Marland Kitchen... 1 by mouth qd  Complete Medication List: 1)  Torsemide 20 Mg Tabs (Torsemide) .... Take 1 by mouth qd 2)  Allopurinol 300 Mg Tabs (Allopurinol) .Marland Kitchen.. 1 once daily 3)  Amitriptyline Hcl 25 Mg Tabs (Amitriptyline hcl) .Marland Kitchen.. 1 once daily at hs 4)  Triamcinolone Acetonide 0.5 % Crea (Triamcinolone acetonide) .... Bid 5)  Protonix 40 Mg Solr (  Pantoprazole sodium) .... Take 1 tablet by mouth once a day 6)  Vitamin D3 1000 Unit Tabs (Cholecalciferol) .Marland Kitchen.. 1 by mouth daily 7)  Doxycycline Hyclate 100 Mg Caps (Doxycycline hyclate) .... Take 1 tab twice a day x 7 d prn 8)  Metformin Hcl 500 Mg Tabs (Metformin hcl) .... Take 1 tab twice daily 9)  Bactroban 2 % Oint (Mupirocin) .... Use qid 10)  Hibiclens 4 % Liqd (Chlorhexidine gluconate) .... Use qd 11)  Coreg 25 Mg Tabs (Carvedilol) .Marland Kitchen.. 1  by mouth two times a day 12)  Eye Drops  .... Once daily 13)  Oxybutynin Chloride 15 Mg Xr24h-tab (Oxybutynin chloride) .... Once daily 14)  Amlodipine Besylate 5 Mg Tabs (Amlodipine besylate) .Marland Kitchen.. 1 by mouth qd 15)  Benzonatate 200 Mg Caps (Benzonatate) .Marland Kitchen.. 1 by mouth three times a day as needed cough 16)  Terazol 7 0.4 % Crea (Terconazole) .... Use pv x 7 d as needed hs 17)  Aspirin 81 Mg Tbec (Aspirin) .... One by mouth every day  Patient Instructions: 1)  Call Dr Shon Baton if not better next week or call me. Use the splint  2)  Please schedule a follow-up appointment in 4 months. 3)  BMP prior to visit, ICD-9: 4)  Hepatic Panel prior to visit, ICD-9: 5)  Lipid Panel prior to visit, ICD-9: 6)  HbgA1C prior to visit, ICD-9: 250.00

## 2011-01-23 NOTE — Assessment & Plan Note (Signed)
Summary: f/u from leg pain/resch from monday/SD   Vital Signs:  Patient profile:   77 year old female Height:      64 inches Weight:      188 pounds BMI:     32.39 Temp:     98.4 degrees F oral Pulse rate:   60 / minute Pulse rhythm:   regular BP sitting:   130 / 76  (left arm) Cuff size:   regular  Vitals Entered By: Lanier Prude, Beverly Gust) (October 20, 2010 8:36 AM) CC: f/u left leg pain Is Patient Diabetic? Yes   Primary Care Provider:  Sula Soda, MD   CC:  f/u left leg pain.  History of Present Illness: F/u leg pain L and elev K - resolved F/u HTN, DM, LBP  Current Medications (verified): 1)  Metformin Hcl 850 Mg Tabs (Metformin Hcl) .Marland Kitchen.. 1 By Mouth Bid 2)  Amlodipine Besylate 5 Mg Tabs (Amlodipine Besylate) .Marland Kitchen.. 1 By Mouth Qd 3)  Coreg 25 Mg Tabs (Carvedilol) .Marland Kitchen.. 1 By Mouth Two Times A Day 4)  Torsemide 20 Mg  Tabs (Torsemide) .... Take 1 By Mouth Qd 5)  Allopurinol 300 Mg  Tabs (Allopurinol) .Marland Kitchen.. 1 Once Daily 6)  Amitriptyline Hcl 25 Mg  Tabs (Amitriptyline Hcl) .Marland Kitchen.. 1 Once Daily At [facility] Prn 7)  Triamcinolone Acetonide 0.5 % Crea (Triamcinolone Acetonide) .... Two Times A Day Prn 8)  Vitamin D3 2000 Unit Caps (Cholecalciferol) .... Take One By Mouth Once Daily 9)  Doxycycline Hyclate 100 Mg Caps (Doxycycline Hyclate) .... Take 1 Tab Twice A Day X 7 D Prn 10)  Bactroban 2 %  Oint (Mupirocin) .... Use Qid Prn 11)  Terazol 7 0.4 % Crea (Terconazole) .... Use Pv X 7 D As Needed Hs 12)  Aspirin 81 Mg  Tbec (Aspirin) .... One By Mouth Every Day 13)  Levsin/sl 0.125 Mg Subl (Hyoscyamine Sulfate) .... Take 1 Under Tongue Every 4-6 Hours As Needed For Pain and Spasms 14)  Pantoprazole Sodium 40 Mg Tbec (Pantoprazole Sodium) .... Take 1 Tablet By Mouth Two Times A Day 15)  Pennsaid 1.5 % Soln (Diclofenac Sodium) .... 3-5 Gtt On Skin Three Times A Day For Pain 16)  Ibuprofen 600 Mg Tabs (Ibuprofen) .Marland Kitchen.. 1 By Mouth Bid  Pc X 1 Wk Then As Needed For  Pain 17)  Eye Drops  .... Once Daily 18)  Aloe Vera .... Take One By Mouth Once Daily  Allergies (verified): 1)  ! Aspirin Ec (Aspirin) 2)  ! Codeine Sulfate (Codeine Sulfate) 3)  ! Diovan (Valsartan) 4)  ! Motrin Ib (Ibuprofen) 5)  Lisinopril (Lisinopril) 6)  Verapamil 7)  Hydrochlorothiazide 8)  Atenolol  Past History:  Past Medical History: Last updated: 07/10/2010 Colonic polyps, hx of Diabetes mellitus, type II Diverticulitis, hx of Gout Hypertension Osteoarthritis Hyperlipidemia Pulmonary HTN L hydronephrosis L adrenal adenoma 2006 GERD w/esophaageal spasm 2011 Boils 2009 Cerebrovascular accident, cerebellar 2010 Low back pain cholelithiasis asympt. with a nl HIDA 4/11 Stress  Social History: Last updated: 06/14/2009 Retired Married Never Smoked Alcohol Use - no Illicit Drug Use - no  Review of Systems  The patient denies fever, chest pain, syncope, dyspnea on exertion, and peripheral edema.    Physical Exam  General:  alert, well-developed, well-nourished, well-hydrated, appropriate dress, normal appearance, healthy-appearing, cooperative to examination, and good hygiene.   Nose:  External nasal examination shows no deformity or inflammation. Nasal mucosa are pink and moist without lesions or exudates. Mouth:  Oral mucosa and  oropharynx without lesions or exudates.  Teeth in good repair. Lungs:  Normal respiratory effort, chest expands symmetrically. Lungs are clear to auscultation, no crackles or wheezes. Heart:  Normal rate and regular rhythm. S1 and S2 normal without gallop, murmur, click, rub or other extra sounds. Abdomen:  soft, non-tender, normal bowel sounds, no distention, no masses, no guarding, no rigidity, no rebound tenderness, no abdominal hernia, no inguinal hernia, no hepatomegaly, and no splenomegaly.   Msk:  normal ROM, no joint tenderness, no joint swelling, no joint warmth, no redness over joints, no joint deformities, no joint instability, and no  crepitation.   Neurologic:  No cranial nerve deficits noted. Station and gait are normal. Plantar reflexes are down-going bilaterally. DTRs are symmetrical throughout. Sensory, motor and coordinative functions appear intact. Skin:  Intact without suspicious lesions or rashes Psych:  Cognition and judgment appear intact. Alert and cooperative with normal attention span and concentration. No apparent delusions, illusions, hallucinations   Impression & Recommendations:  Problem # 1:  LEG PAIN, LEFT (ICD-729.5) resolved Assessment Improved She had a venouse doppler  Problem # 2:  HYPERKALEMIA (ICD-276.7) - poss it was a lab error Assessment: Improved OK to stay off Spironolactone for now  Problem # 3:  HYPERTENSION (ICD-401.9) Assessment: Unchanged  Her updated medication list for this problem includes:    Amlodipine Besylate 5 Mg Tabs (Amlodipine besylate) .Marland Kitchen... 1 by mouth qd    Coreg 25 Mg Tabs (Carvedilol) .Marland Kitchen... 1 by mouth two times a day    Torsemide 20 Mg Tabs (Torsemide) .Marland Kitchen... Take 1 by mouth qd  Problem # 4:  DIABETES MELLITUS, TYPE II (ICD-250.00) Assessment: Unchanged  Her updated medication list for this problem includes:    Metformin Hcl 850 Mg Tabs (Metformin hcl) .Marland Kitchen... 1 by mouth bid    Aspirin 81 Mg Tbec (Aspirin) ..... One by mouth every day  Problem # 5:  LOW BACK PAIN (ICD-724.2) Assessment: Improved  Her updated medication list for this problem includes:    Aspirin 81 Mg Tbec (Aspirin) ..... One by mouth every day    Ibuprofen 600 Mg Tabs (Ibuprofen) .Marland Kitchen... 1 by mouth bid  pc x 1 wk then as needed for  pain  Complete Medication List: 1)  Metformin Hcl 850 Mg Tabs (Metformin hcl) .Marland Kitchen.. 1 by mouth bid 2)  Amlodipine Besylate 5 Mg Tabs (Amlodipine besylate) .Marland Kitchen.. 1 by mouth qd 3)  Coreg 25 Mg Tabs (Carvedilol) .Marland Kitchen.. 1 by mouth two times a day 4)  Torsemide 20 Mg Tabs (Torsemide) .... Take 1 by mouth qd 5)  Allopurinol 300 Mg Tabs (Allopurinol) .Marland Kitchen.. 1 once daily 6)   Amitriptyline Hcl 25 Mg Tabs (Amitriptyline hcl) .Marland Kitchen.. 1 once daily at hs prn 7)  Triamcinolone Acetonide 0.5 % Crea (Triamcinolone acetonide) .... Two times a day prn 8)  Vitamin D3 2000 Unit Caps (Cholecalciferol) .... Take one by mouth once daily 9)  Doxycycline Hyclate 100 Mg Caps (Doxycycline hyclate) .... Take 1 tab twice a day x 7 d prn 10)  Bactroban 2 % Oint (Mupirocin) .... Use qid prn 11)  Terazol 7 0.4 % Crea (Terconazole) .... Use pv x 7 d as needed hs 12)  Aspirin 81 Mg Tbec (Aspirin) .... One by mouth every day 13)  Levsin/sl 0.125 Mg Subl (Hyoscyamine sulfate) .... Take 1 under tongue every 4-6 hours as needed for pain and spasms 14)  Pantoprazole Sodium 40 Mg Tbec (Pantoprazole sodium) .... Take 1 tablet by mouth two times a day  15)  Pennsaid 1.5 % Soln (Diclofenac sodium) .... 3-5 gtt on skin three times a day for pain 16)  Ibuprofen 600 Mg Tabs (Ibuprofen) .Marland Kitchen.. 1 by mouth bid  pc x 1 wk then as needed for  pain 17)  Eye Drops  .... Once daily 18)  Aloe Vera  .... Take one by mouth once daily  Patient Instructions: 1)  Keep return office visit (well)   Orders Added: 1)  Est. Patient Level IV [37628]

## 2011-01-23 NOTE — Medication Information (Signed)
Summary: Diabetes Supplies/[facility]  Diabetes Supplies/[facility]   Imported By: Sherian Rein 06/16/2010 09:22:57  _____________________________________________________________________  External Attachment:    Type:   Image     Comment:   External Document

## 2011-01-23 NOTE — Progress Notes (Signed)
Summary: BA Swallow Scheduled   Phone Note Outgoing Call   Call placed by: Laureen Ochs LPN,  July 13, 2009 10:39 AM Call placed to: Patient Summary of Call: Pt. is scheduled for a BA Swallow at [facility] on 07-15-09 at [facility]. Message left for patient to callback.  Initial call taken by: Laureen Ochs LPN,  July 13, 2009 10:40 AM  Follow-up for Phone Call        Appointment information reviewed with Ms.Hurd. Pt. instructed to call back as needed.  Follow-up by: Laureen Ochs LPN,  July 13, 2009 3:37 PM

## 2011-01-23 NOTE — Miscellaneous (Signed)
Summary: Nexium Rx  Clinical Lists Changes  Medications: Added new medication of NEXIUM 40 MG CPDR (ESOMEPRAZOLE MAGNESIUM) 1 tablet by mouth two times a day 30 minutes before meals - Signed Rx of NEXIUM 40 MG CPDR (ESOMEPRAZOLE MAGNESIUM) 1 tablet by mouth two times a day 30 minutes before meals;  #60 x 3;  Signed;  Entered by: Sherren Kerns RN;  Authorized by: Hart Carwin MD;  Method used: Print then Give to Patient    Prescriptions: NEXIUM 40 MG CPDR (ESOMEPRAZOLE MAGNESIUM) 1 tablet by mouth two times a day 30 minutes before meals  #60 x 3   Entered by:   Sherren Kerns RN   Authorized by:   Hart Carwin MD   Signed by:   Sherren Kerns RN on 04/28/2010   Method used:   Print then Give to Patient   RxID:   [id number]

## 2011-01-23 NOTE — Assessment & Plan Note (Signed)
Summary: 3 MTH FU--STC   Vital Signs:  Patient profile:   77 year old female Height:      64 inches Weight:      189 pounds BMI:     32.56 Temp:     98.1 degrees F oral Pulse rate:   76 / minute Pulse rhythm:   regular BP sitting:   128 / 78  (left arm) Cuff size:   regular  Vitals Entered By: Lanier Prude, CMA(AAMA) (September 29, 2010 9:13 AM) CC: 3 mo f/u  c/o Lt shoulder pain Is Patient Diabetic? Yes   Primary Care Provider:  Sula Soda, MD   CC:  3 mo f/u  c/o Lt shoulder pain.  History of Present Illness: The patient presents for a follow up of hypertension, diabetes, hyperlipidemia C/o L shoulder pain x 1 wk  Current Medications (verified): 1)  Torsemide 20 Mg  Tabs (Torsemide) .... Take 1 By Mouth Qd 2)  Allopurinol 300 Mg  Tabs (Allopurinol) .Marland Kitchen.. 1 Once Daily 3)  Amitriptyline Hcl 25 Mg  Tabs (Amitriptyline Hcl) .Marland Kitchen.. 1 Once Daily At [facility] Prn 4)  Triamcinolone Acetonide 0.5 % Crea (Triamcinolone Acetonide) .... Two Times A Day Prn 5)  Vitamin D3 2000 Unit Caps (Cholecalciferol) .... Take One By Mouth Once Daily 6)  Doxycycline Hyclate 100 Mg Caps (Doxycycline Hyclate) .... Take 1 Tab Twice A Day X 7 D Prn 7)  Metformin Hcl 500 Mg Tabs (Metformin Hcl) .... Take 1 Tab Twice Daily 8)  Bactroban 2 %  Oint (Mupirocin) .... Use Qid Prn 9)  Coreg 25 Mg Tabs (Carvedilol) .Marland Kitchen.. 1 By Mouth Two Times A Day 10)  Eye Drops .... Once Daily 11)  Amlodipine Besylate 5 Mg Tabs (Amlodipine Besylate) .Marland Kitchen.. 1 By Mouth Qd 12)  Benzonatate 200 Mg Caps (Benzonatate) .Marland Kitchen.. 1 By Mouth Three Times A Day As Needed Cough 13)  Terazol 7 0.4 % Crea (Terconazole) .... Use Pv X 7 D As Needed Hs 14)  Aspirin 81 Mg  Tbec (Aspirin) .... One By Mouth Every Day 15)  Spironolactone 50 Mg Tabs (Spironolactone) .Marland Kitchen.. 1 By Mouth Qam For Blood Pressure 16)  Aloe Vera .... Take One By Mouth Once Daily 17)  Levsin/sl 0.125 Mg Subl (Hyoscyamine Sulfate) .... Take 1 Under Tongue Every 4-6 Hours As Needed For  Pain and Spasms 18)  Pantoprazole Sodium 40 Mg Tbec (Pantoprazole Sodium) .... Take 1 Tablet By Mouth Two Times A Day 19)  Pennsaid 1.5 % Soln (Diclofenac Sodium) .... 3-5 Gtt On Skin Three Times A Day For Pain 20)  Ibuprofen 600 Mg Tabs (Ibuprofen) .Marland Kitchen.. 1 By Mouth Bid  Pc X 1 Wk Then As Needed For  Pain  Allergies (verified): 1)  ! Aspirin Ec (Aspirin) 2)  ! Codeine Sulfate (Codeine Sulfate) 3)  ! Diovan (Valsartan) 4)  ! Motrin Ib (Ibuprofen) 5)  Lisinopril (Lisinopril) 6)  Verapamil 7)  Hydrochlorothiazide 8)  Atenolol  Past History:  Past Medical History: Last updated: 07/10/2010 Colonic polyps, hx of Diabetes mellitus, type II Diverticulitis, hx of Gout Hypertension Osteoarthritis Hyperlipidemia Pulmonary HTN L hydronephrosis L adrenal adenoma 2006 GERD w/esophaageal spasm 2011 Boils 2009 Cerebrovascular accident, cerebellar 2010 Low back pain cholelithiasis asympt. with a nl HIDA 4/11 Stress  Social History: Last updated: 06/14/2009 Retired Married Never Smoked Alcohol Use - no Illicit Drug Use - no  Review of Systems       The patient complains of difficulty walking.  The patient denies fever, syncope, dyspnea  on exertion, and depression.    Physical Exam  General:  NAD, looks tired, alert, oriented and very pleasant. Nose:  External nasal examination shows no deformity or inflammation. Nasal mucosa are pink and moist without lesions or exudates. Mouth:  coated tongue, dryish Lungs:  Normal respiratory effort, chest expands symmetrically. Lungs are clear to auscultation, no crackles or wheezes. Heart:  clear breath sounds. Normal S1, normal S2. Abdomen:  obese soft abdomen without tenderness. Well-healed surgical scars in the lower abdomen. Liver edge at costal margin. Msk:  L subacr space is  not tender Extremities:  No edema Neurologic:  No cranial nerve deficits noted. Station and gait are normal. Plantar reflexes are down-going bilaterally. DTRs  are symmetrical throughout. Sensory, motor and coordinative functions appear intact. Steady gait Skin:  Clear Cervical Nodes:  R posterior LN tender.   Psych:  Oriented X3.    Diabetes Management Exam:    Foot Exam (with socks and/or shoes not present):       Sensory-Pinprick/Light touch:          Left medial foot (L-4): normal          Left dorsal foot (L-5): normal          Left lateral foot (S-1): normal          Right medial foot (L-4): normal          Right dorsal foot (L-5): normal          Right lateral foot (S-1): normal       Sensory-Monofilament:          Left foot: normal          Right foot: normal       Inspection:          Left foot: normal          Right foot: normal       Nails:          Left foot: normal          Right foot: normal   Impression & Recommendations:  Problem # 1:  SHOULDER PAIN (ICD-719.41) L - subacr bursitis Assessment New  Her updated medication list for this problem includes:    Aspirin 81 Mg Tbec (Aspirin) ..... One by mouth every day    Ibuprofen 600 Mg Tabs (Ibuprofen) .Marland Kitchen... 1 by mouth bid  pc x 1 wk then as needed for  pain  Problem # 2:  DIABETES MELLITUS, TYPE II (ICD-250.00) Assessment: Deteriorated  The following medications were removed from the medication list:    Metformin Hcl 500 Mg Tabs (Metformin hcl) .Marland Kitchen... Take 1 tab twice daily Her updated medication list for this problem includes:    Metformin Hcl 850 Mg Tabs (Metformin hcl) .Marland Kitchen... 1 by mouth bid    Aspirin 81 Mg Tbec (Aspirin) ..... One by mouth every day  Problem # 3:  MEMORY LOSS (ICD-780.93) Assessment: Improved  Problem # 4:  HYPERTENSION (ICD-401.9) Assessment: Unchanged  Her updated medication list for this problem includes:    Amlodipine Besylate 5 Mg Tabs (Amlodipine besylate) .Marland Kitchen... 1 by mouth qd    Spironolactone 50 Mg Tabs (Spironolactone) .Marland Kitchen... 1 by mouth qam for blood pressure    Coreg 25 Mg Tabs (Carvedilol) .Marland Kitchen... 1 by mouth two times a day     Torsemide 20 Mg Tabs (Torsemide) .Marland Kitchen... Take 1 by mouth qd  Complete Medication List: 1)  Metformin Hcl 850 Mg Tabs (Metformin hcl) .Marland Kitchen.. 1 by mouth  bid 2)  Amlodipine Besylate 5 Mg Tabs (Amlodipine besylate) .Marland Kitchen.. 1 by mouth qd 3)  Spironolactone 50 Mg Tabs (Spironolactone) .Marland Kitchen.. 1 by mouth qam for blood pressure 4)  Coreg 25 Mg Tabs (Carvedilol) .Marland Kitchen.. 1 by mouth two times a day 5)  Torsemide 20 Mg Tabs (Torsemide) .... Take 1 by mouth qd 6)  Allopurinol 300 Mg Tabs (Allopurinol) .Marland Kitchen.. 1 once daily 7)  Amitriptyline Hcl 25 Mg Tabs (Amitriptyline hcl) .Marland Kitchen.. 1 once daily at hs prn 8)  Triamcinolone Acetonide 0.5 % Crea (Triamcinolone acetonide) .... Two times a day prn 9)  Vitamin D3 2000 Unit Caps (Cholecalciferol) .... Take one by mouth once daily 10)  Doxycycline Hyclate 100 Mg Caps (Doxycycline hyclate) .... Take 1 tab twice a day x 7 d prn 11)  Bactroban 2 % Oint (Mupirocin) .... Use qid prn 12)  Terazol 7 0.4 % Crea (Terconazole) .... Use pv x 7 d as needed hs 13)  Aspirin 81 Mg Tbec (Aspirin) .... One by mouth every day 14)  Levsin/sl 0.125 Mg Subl (Hyoscyamine sulfate) .... Take 1 under tongue every 4-6 hours as needed for pain and spasms 15)  Pantoprazole Sodium 40 Mg Tbec (Pantoprazole sodium) .... Take 1 tablet by mouth two times a day 16)  Pennsaid 1.5 % Soln (Diclofenac sodium) .... 3-5 gtt on skin three times a day for pain 17)  Ibuprofen 600 Mg Tabs (Ibuprofen) .Marland Kitchen.. 1 by mouth bid  pc x 1 wk then as needed for  pain 18)  Eye Drops  .... Once daily 19)  Aloe Vera  .... Take one by mouth once daily  Other Orders: Flu Vaccine 35yrs + MEDICARE PATIENTS (N5621) Administration Flu vaccine - MCR (H0865)  Patient Instructions: 1)  Please schedule a follow-up appointment in 3 months. 2)  BMP prior to visit, ICD-9: 3)  HbgA1C prior to visit, ICD-9:250.00 Prescriptions: TERAZOL 7 0.4 % CREA (TERCONAZOLE) use pv x 7 d as needed hs  #45 Gram x 2   Entered and Authorized by:   Tresa Garter MD   Signed by:   Tresa Garter MD on 09/29/2010   Method used:   Electronically to        [facility]  [address] [phone]* (retail)       [address]       [place], [place]  [id number]       Ph: [phone] or [phone]       Fax: [phone]   RxID:   [id number] SPIRONOLACTONE 50 MG TABS (SPIRONOLACTONE) 1 by mouth qam for blood pressure  #90 x 3   Entered and Authorized by:   Tresa Garter MD   Signed by:   Tresa Garter MD on 09/29/2010   Method used:   Faxed to ...       [facility] (mail-order)             , [place]         Ph: [phone]       Fax: [phone]   RxID:   [id number] METFORMIN HCL 850 MG TABS (METFORMIN HCL) 1 by mouth bid  #180 x 3   Entered and Authorized by:   Tresa Garter MD   Signed by:   Tresa Garter MD on 09/29/2010   Method used:   Faxed to ...       [facility] (mail-order)             ,  [place]         Ph: [phone]       Fax: [phone]   RxID:   [id number]   .[email or web address]   Flu Vaccine Consent Questions     Do you have a history of severe allergic reactions to this vaccine? no    Any prior history of allergic reactions to egg and/or gelatin? no    Do you have a sensitivity to the preservative Thimersol? no    Do you have a past history of Guillan-Barre Syndrome? no    Do you currently have an acute febrile illness? no    Have you ever had a severe reaction to latex? no    Vaccine information given and explained to patient? yes    Are you currently pregnant? no    Lot Number:[id number]   Exp Date:06/23/2011   Site Given  Left Deltoid IM Lanier Prude, [facility])  September 29, 2010 10:35 AM

## 2011-01-23 NOTE — Medication Information (Signed)
Summary: Shoes & inserts/[facility]  Shoes & inserts/[facility]   Imported By: Lester McBride 05/26/2009 10:14:13  _____________________________________________________________________  External Attachment:    Type:   Image     Comment:   External Document

## 2011-01-23 NOTE — Procedures (Signed)
Summary: [facility]  [facility]   Imported By: Irma Newness 05/18/2008 11:55:13  _____________________________________________________________________  External Attachment:    Type:   Image     Comment:   External Document

## 2011-01-23 NOTE — Progress Notes (Signed)
Summary: RF Avapro  pt is out of med   Phone Note Call from Patient   Summary of Call: Pt requesting Avapro RF pt is out of med Initial call taken by: Lamar Sprinkles,  October 10, 2007 4:51 PM  Follow-up for Phone Call        ok to refill x 3. Follow-up by: Dondra Spry DO,  October 10, 2007 6:41 PM         Appended Document: RF Avapro  pt is out of med Refill completed by Dr Everardo All 10/17

## 2011-01-23 NOTE — Letter (Signed)
Summary: [facility]   [facility]   Imported By: Sherian Rein 06/28/2010 11:19:00  _____________________________________________________________________  External Attachment:    Type:   Image     Comment:   External Document

## 2011-01-23 NOTE — Progress Notes (Signed)
Summary: ER Visit  Phone Note Call from Patient Call back at Home Phone [phone]   Summary of Call: Pt was seen in ER last night. She was told that a test showed she has had a stroke in the past. ER advised pt to f/u with Dr Posey Rea soon. Pt is scheduled for apt next wednesday with labs prior. Is this ok? Or do you want to see pt sooner?  Initial call taken by: Lamar Sprinkles, CMA,  August 30, 2009 9:03 AM  Follow-up for Phone Call        OK Wed if she has no new symptoms  Thx Follow-up by: Tresa Garter MD,  August 30, 2009 11:52 AM  Additional Follow-up for Phone Call Additional follow up Details #1::        Pt informed, she will keep apt next week Additional Follow-up by: Lamar Sprinkles, CMA,  August 30, 2009 12:01 PM

## 2011-01-23 NOTE — Procedures (Signed)
Summary: Upper Endoscopy  Patient: [name] Note: All result statuses are Final unless otherwise noted.  Tests: (1) Upper Endoscopy (EGD)   EGD Upper Endoscopy       DONE     [facility]     [address]     [place], [place]  [address]           ENDOSCOPY PROCEDURE REPORT           PATIENT:  Breanna [name], Breanna [name]  MR#:  [id number]     BIRTHDATE:  05-09-1931, 78 yrs. old  GENDER:  female           ENDOSCOPIST:  Hedwig Morton. Juanda Chance, MD     Referred by:  Linda Hedges Plotnikov, M.D.           PROCEDURE DATE:  04/28/2010     PROCEDURE:  EGD with biopsy, Maloney Dilation of Esophagus     ASA CLASS:  Class II     INDICATIONS:  chest pain noncardial chest point     tertiary contractions on barium study     gall stone on ultrasound, HIDA is negative           MEDICATIONS:   Versed 6 mg, Fentanyl 37.5 mcg     TOPICAL ANESTHETIC:  Exactacain Spray           DESCRIPTION OF PROCEDURE:   After the risks benefits and     alternatives of the procedure were thoroughly explained, informed     consent was obtained.  The LB [id number] endoscope was     introduced through the mouth and advanced to the second portion of     the duodenum, without limitations.  The instrument was slowly     withdrawn as the mucosa was fully examined.     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 tortuous esophagus, eccentric lumen distally, maloney     dilator 12F Maloney dil  There were multiple polyps identified.     multiple fundic gland polyps With standard forceps, a biopsy was     obtained and sent to pathology (see image2 and image7).  Otherwise     the examination was normal (see image6, image5, image4, and     image3).    Retroflexed views revealed no abnormalities.    The     scope was then withdrawn from the patient and the procedure     completed.           COMPLICATIONS:  None           ENDOSCOPIC IMPRESSION:     1) Presbyesophagus  2) Polyps, multiple     3) Otherwise normal examination     s/p passage of 12F Maloney dilator     RECOMMENDATIONS:     1) Await biopsy results     contineu nexiem 40 mg po bid, #60, 3 refills           REPEAT EXAM:  In 0 year(s) for.           ______________________________     Hedwig Morton. Juanda Chance, MD           CC:           n.     eSIGNED:   Hedwig Morton. Jaimie Redditt at 04/28/2010 02:28 PM           Page 2 of 3  Saysha, Menta, [id number]  Note: An exclamation mark (!) indicates a result that was not dispersed into the flowsheet. Document Creation Date: 04/28/2010 2:29 PM _______________________________________________________________________  (1) Order result status: Final Collection or observation date-time: 04/28/2010 14:12 Requested date-time:  Receipt date-time:  Reported date-time:  Referring Physician:   Ordering Physician: Lina Sar [phone]) Specimen Source:  Source: Launa Grill Order Number: [id number] Lab site:

## 2011-01-25 NOTE — Letter (Signed)
Summary: [facility] Instructions  [facility]  [address] [place], [place] [address]   Phone: [phone]  Fax: [phone]       Breanna [name]    1931-06-13    MRN: [id number]       Procedure Day /Date: Tuesday 01/09/11     Arrival Time: 9:30 am     Procedure Time: 10:30 am     Location of Procedure:                    _x _   [facility] (4th Floor)  PREPARATION FOR COLONOSCOPY WITH MIRALAX  Starting 5 days prior to your procedure 01/04/11 do not eat nuts, seeds, popcorn, corn, beans, peas,  salads, or any raw vegetables.  Do not take any fiber supplements (e.g. Metamucil, Citrucel, and Benefiber). ____________________________________________________________________________________________________   THE DAY BEFORE YOUR PROCEDURE         DATE: 01/08/11 DAY: Monday  1   Drink clear liquids the entire day-NO SOLID FOOD  2   Do not drink anything colored red or purple.  Avoid juices with pulp.  No orange juice.  3   Drink at least 64 oz. (8 glasses) of fluid/clear liquids during the day to prevent dehydration and help the prep work efficiently.  CLEAR LIQUIDS INCLUDE: Water Jello Ice Popsicles Tea (sugar ok, no milk/cream) Powdered fruit flavored drinks Coffee (sugar ok, no milk/cream) Gatorade Juice: apple, white grape, white cranberry  Lemonade Clear bullion, consomm, broth Carbonated beverages (any kind) Strained chicken noodle soup Hard Candy  4   Mix the entire bottle of Miralax with 64 oz. of Gatorade/Powerade in the morning and put in the refrigerator to chill.  5   At 3:00 pm take 2 Dulcolax/Bisacodyl tablets.  6   At 4:30 pm take one Reglan/Metoclopramide tablet.  7  Starting at 5:00 pm drink one 8 oz glass of the Miralax mixture every 15-20 minutes until you have finished drinking the entire 64 oz.  You should finish drinking prep around 7:30 or 8:00 pm.  8   If you are nauseated, you may take the 2nd Reglan/Metoclopramide tablet at  6:30 pm.        9    At 8:00 pm take 2 more DULCOLAX/Bisacodyl tablets.        THE DAY OF YOUR PROCEDURE      DATE:  01/09/11 DAY: Tuesday  You may drink clear liquids until 8:30 am  (2 HOURS BEFORE PROCEDURE).   MEDICATION INSTRUCTIONS  Unless otherwise instructed, you should take regular prescription medications with a small sip of water as early as possible the morning of your procedure.  Diabetic patients - see separate instructions.          OTHER INSTRUCTIONS  You will need a responsible adult at least 75 years of age to accompany you and drive you home.   This person must remain in the waiting room during your procedure.  Wear loose fitting clothing that is easily removed.  Leave jewelry and other valuables at home.  However, you may wish to bring a book to read or an iPod/MP3 player to listen to music as you wait for your procedure to start.  Remove all body piercing jewelry and leave at home.  Total time from sign-in until discharge is approximately 2-3 hours.  You should go home directly after your procedure and rest.  You can resume normal activities the day after your procedure.  The day of your procedure  you should not:   Drive   Make legal decisions   Operate machinery   Drink alcohol   Return to work  You will receive specific instructions about eating, activities and medications before you leave.   The above instructions have been reviewed and explained to me by   _______________________    I fully understand and can verbalize these instructions _____________________________ Date _______

## 2011-01-25 NOTE — Assessment & Plan Note (Signed)
Summary: 3 MO ROV /NWS  #   Vital Signs:  Patient profile:   77 year old female Height:      64 inches Weight:      189 pounds BMI:     32.56 Temp:     98.6 degrees F oral Pulse rate:   64 / minute Pulse rhythm:   regular Resp:     16 per minute BP sitting:   140 / 80  (left arm) Cuff size:   regular  Vitals Entered By: Lanier Prude, CMA(AAMA) (January 03, 2011 8:24 AM) CC: 3 mo f/u c/o tightness& edema in bilateral legs, not urinating as frequently Is Patient Diabetic? Yes   Primary Care Provider:  Sula Soda, MD   CC:  3 mo f/u c/o tightness& edema in bilateral legs and not urinating as frequently.  History of Present Illness: The patient presents for a follow up of hypertension, diabetes, hyperlipidemia C/o feet burning pains in am's C/o GERD at times  Current Medications (verified): 1)  Metformin Hcl 850 Mg Tabs (Metformin Hcl) .Marland Kitchen.. 1 By Mouth Bid 2)  Amlodipine Besylate 5 Mg Tabs (Amlodipine Besylate) .Marland Kitchen.. 1 By Mouth Qd 3)  Coreg 25 Mg Tabs (Carvedilol) .Marland Kitchen.. 1 By Mouth Two Times A Day 4)  Torsemide 20 Mg  Tabs (Torsemide) .... Take 1 By Mouth Qd 5)  Allopurinol 300 Mg  Tabs (Allopurinol) .Marland Kitchen.. 1 Once Daily 6)  Amitriptyline Hcl 25 Mg  Tabs (Amitriptyline Hcl) .Marland Kitchen.. 1 Once Daily At [facility] Prn 7)  Triamcinolone Acetonide 0.5 % Crea (Triamcinolone Acetonide) .... Two Times A Day Prn 8)  Vitamin D3 2000 Unit Caps (Cholecalciferol) .... Take One By Mouth Once Daily 9)  Bactroban 2 %  Oint (Mupirocin) .... Use Qid Prn 10)  Terazol 7 0.4 % Crea (Terconazole) .... Use Pv X 7 D As Needed Hs 11)  Aspirin 81 Mg  Tbec (Aspirin) .... One By Mouth Every Day 12)  Levsin/sl 0.125 Mg Subl (Hyoscyamine Sulfate) .... Take 1 Under Tongue Every 4-6 Hours As Needed For Pain and Spasms 13)  Pantoprazole Sodium 40 Mg Tbec (Pantoprazole Sodium) .... Take 1 Tablet By Mouth Two Times A Day 14)  Pennsaid 1.5 % Soln (Diclofenac Sodium) .... 3-5 Gtt On Skin Three Times A Day For Pain 15)   Ibuprofen 600 Mg Tabs (Ibuprofen) .Marland Kitchen.. 1 By Mouth Bid  Pc X 1 Wk Then As Needed For  Pain 16)  Eye Drops .... Once Daily 17)  Miralax   Powd (Polyethylene Glycol 3350) .... As Per Prep  Instructions. 18)  Reglan 10 Mg  Tabs (Metoclopramide Hcl) .... As Per Prep Instructions. 19)  Dulcolax 5 Mg  Tbec (Bisacodyl) .... Day Before Procedure Take 2 At 3pm and 2 At 8pm.  Allergies (verified): 1)  ! Aspirin Ec (Aspirin) 2)  ! Codeine Sulfate (Codeine Sulfate) 3)  ! Diovan (Valsartan) 4)  ! Motrin Ib (Ibuprofen) 5)  Lisinopril (Lisinopril) 6)  Verapamil 7)  Hydrochlorothiazide 8)  Atenolol  Past History:  Past Medical History: Last updated: 07/10/2010 Colonic polyps, hx of Diabetes mellitus, type II Diverticulitis, hx of Gout Hypertension Osteoarthritis Hyperlipidemia Pulmonary HTN L hydronephrosis L adrenal adenoma 2006 GERD w/esophaageal spasm 2011 Boils 2009 Cerebrovascular accident, cerebellar 2010 Low back pain cholelithiasis asympt. with a nl HIDA 4/11 Stress  Past Surgical History: Last updated: 03/27/2010 SIGMOID COLECTOMY 2006 Hysterectomy Total knee replacement L 2003 R rot cuff surg 08 Right Breast Biopsy Knee replacement artho Surgical Intervention for bilateral hydronephrosis Cataract Extraction -  Bilateral Bilateral Foot Reconstruction Vericose Vein Stripping-lower extremities hammer toe surgery back surgery  Social History: Last updated: 06/14/2009 Retired Married Never Smoked Alcohol Use - no Illicit Drug Use - no  Physical Exam  General:  alert, well-developed, overweight, well-hydrated, appropriate dress, normal appearance, healthy-appearing, cooperative to examination, and good hygiene.   Eyes:  vision grossly intact.   Mouth:  Oral mucosa and oropharynx without lesions or exudates.  Teeth in good repair. Lungs:  Normal respiratory effort, chest expands symmetrically. Lungs are clear to auscultation, no crackles or wheezes. Heart:  Normal  rate and regular rhythm. S1 and S2 normal without gallop, murmur, click, rub or other extra sounds. Abdomen:  soft, non-tender, normal bowel sounds, no distention, no masses, no guarding, no rigidity, no rebound tenderness, no abdominal hernia, no inguinal hernia, no hepatomegaly, and no splenomegaly.   Msk:  normal ROM, no joint tenderness, no joint swelling, no joint warmth, no redness over joints, no joint deformities, no joint instability, and no crepitation.   Extremities:  No clubbing, cyanosis, edema, or deformity noted with normal full range of motion of all joints.   Neurologic:  No cranial nerve deficits noted. Station and gait are normal. Plantar reflexes are down-going bilaterally. DTRs are symmetrical throughout. Sensory, motor and coordinative functions appear intact. Skin:  Intact without suspicious lesions or rashes Cervical Nodes:  no anterior cervical adenopathy and no posterior cervical adenopathy.   Psych:  Cognition and judgment appear intact. Alert and cooperative with normal attention span and concentration. No apparent delusions, illusions, hallucinations   Impression & Recommendations:  Problem # 1:  PARESTHESIA (ICD-782.0) LE - ? neuropathy Assessment New Get more labs  Problem # 2:  LOW BACK PAIN (ICD-724.2) Assessment: Unchanged  Her updated medication list for this problem includes:    Aspirin 81 Mg Tbec (Aspirin) ..... One by mouth every day    Ibuprofen 600 Mg Tabs (Ibuprofen) .Marland Kitchen... 1 by mouth bid  pc x 1 wk then as needed for  pain  Problem # 3:  DIABETES MELLITUS, TYPE II (ICD-250.00) Assessment: Improved  Her updated medication list for this problem includes:    Metformin Hcl 850 Mg Tabs (Metformin hcl) .Marland Kitchen... 1 by mouth bid    Aspirin 81 Mg Tbec (Aspirin) ..... One by mouth every day  Labs Reviewed: Creat: 0.8 (12/27/2010)    Reviewed HgBA1c results: 6.9 (12/27/2010)  7.8 (09/27/2010)  Problem # 4:  GOUT (ICD-274.9) Assessment: Improved  Her  updated medication list for this problem includes:    Allopurinol 300 Mg Tabs (Allopurinol) .Marland Kitchen... 1 once daily  Complete Medication List: 1)  Metformin Hcl 850 Mg Tabs (Metformin hcl) .Marland Kitchen.. 1 by mouth bid 2)  Amlodipine Besylate 5 Mg Tabs (Amlodipine besylate) .Marland Kitchen.. 1 by mouth qd 3)  Coreg 25 Mg Tabs (Carvedilol) .Marland Kitchen.. 1 by mouth two times a day 4)  Torsemide 20 Mg Tabs (Torsemide) .... Take 1 by mouth qd 5)  Allopurinol 300 Mg Tabs (Allopurinol) .Marland Kitchen.. 1 once daily 6)  Amitriptyline Hcl 25 Mg Tabs (Amitriptyline hcl) .Marland Kitchen.. 1 once daily at hs prn 7)  Triamcinolone Acetonide 0.5 % Crea (Triamcinolone acetonide) .... Two times a day prn 8)  Vitamin D3 2000 Unit Caps (Cholecalciferol) .... Take one by mouth once daily 9)  Bactroban 2 % Oint (Mupirocin) .... Use qid prn 10)  Terazol 7 0.4 % Crea (Terconazole) .... Use pv x 7 d as needed hs 11)  Aspirin 81 Mg Tbec (Aspirin) .... One by mouth every day  12)  Levsin/sl 0.125 Mg Subl (Hyoscyamine sulfate) .... Take 1 under tongue every 4-6 hours as needed for pain and spasms 13)  Pantoprazole Sodium 40 Mg Tbec (Pantoprazole sodium) .... Take 1 tablet by mouth two times a day 14)  Pennsaid 1.5 % Soln (Diclofenac sodium) .... 3-5 gtt on skin three times a day for pain 15)  Ibuprofen 600 Mg Tabs (Ibuprofen) .Marland Kitchen.. 1 by mouth bid  pc x 1 wk then as needed for  pain 16)  Eye Drops  .... Once daily 17)  Miralax Powd (Polyethylene glycol 3350) .... As per prep  instructions. 18)  Reglan 10 Mg Tabs (Metoclopramide hcl) .... As per prep instructions. 19)  Dulcolax 5 Mg Tbec (Bisacodyl) .... Day before procedure take 2 at 3pm and 2 at 8pm.  Patient Instructions: 1)  Please schedule a follow-up appointment in 3 months well w/labs. 2)  HbgA1C prior to visit, ICD-9: 3)  ESR 4)  Vit B12 782.0  250.00 Prescriptions: COREG 25 MG TABS (CARVEDILOL) 1 by mouth two times a day  #180 Tablet x 3   Entered and Authorized by:   Tresa Garter MD   Signed by:   Tresa Garter MD on 01/03/2011   Method used:   Faxed to ...       [facility]* (retail)             ,          Ph: [phone]       Fax: [phone]   RxID:   [id number] AMLODIPINE BESYLATE 5 MG TABS (AMLODIPINE BESYLATE) 1 by mouth qd  #90 x 3   Entered and Authorized by:   Tresa Garter MD   Signed by:   Tresa Garter MD on 01/03/2011   Method used:   Faxed to ...       [facility]* (retail)             ,          Ph: [phone]       Fax: [phone]   RxID:   [id number]    Orders Added: 1)  Est. Patient Level IV [75643]

## 2011-01-25 NOTE — Progress Notes (Signed)
Summary: Prep ?s   Phone Note Call from Patient Call back at Home Phone [phone]   Caller: Patient Call For: Dr. Juanda Chance Reason for Call: Talk to Nurse Summary of Call: Pt has questions about her prep for procedure in the morning Initial call taken by: [place] Johnson,  January 08, 2011 4:30 PM  Follow-up for Phone Call        Pt. wanted to know if she had to take the reglan.  I told her she did not, and she should take it only if she had nausea.  She was okay with that answer. Follow-up by: Clide Cliff RN,  January 08, 2011 4:33 PM

## 2011-01-25 NOTE — Letter (Signed)
Summary: Diabetic Instructions  [facility]  [address] [place], [place] [address]   Phone: [phone]  Fax: [phone]    MORGANE JOERGER [birth year] MRN: [id number]   _ x _   ORAL DIABETIC MEDICATION INSTRUCTIONS           METFORMIN The day before your procedure:   Take your diabetic pill as you do normally  The day of your procedure:   Do not take your diabetic pill    We will check your blood sugar levels during the admission process and again in Recovery before discharging you home  ________________________________________________________________________

## 2011-01-25 NOTE — Letter (Signed)
Summary: Ophthalmology/[facility]  Ophthalmology/[facility]   Imported By: Sherian Rein 01/04/2011 11:08:55  _____________________________________________________________________  External Attachment:    Type:   Image     Comment:   External Document

## 2011-01-25 NOTE — Assessment & Plan Note (Signed)
Summary: "BOWEL PROBLEMS".Breanna [name].    History of Present Illness Visit Type: Follow-up Visit Primary GI MD: Lina Sar MD Primary Provider: Sula Soda, MD  Requesting Provider: na Chief Complaint: Black tarry stools  History of Present Illness:   This is a 77 year old African American female with a long  history of GI problems.  She is here today with 2 complaints; one is possible gall bladder disease  and another one is intermittent rectal leakage. She describes dark stools. She denies constipation. She has a long history of symptomatic diverticulosis and severe diverticulitis necessitating sigmoid resection in June 2006 and complicated by repair of a cecal tear. Apparently the cecum was adherent to the abdominal wall. She has had colonoscopies; the last one in June 2005. She has not had a colonoscopy since the surgery.  Leakage occurs during the day as well as at night and she is not aware of it most of the time. There has been no rectal bleeding. Another problem has been abdominal pain. She has known cholelithiasis and recently had a HIDA scan which showed a normal ejection fraction. Her alkaline phosphatase has been mildly elevated at 135.   GI Review of Systems      Denies abdominal pain, acid reflux, belching, bloating, chest pain, dysphagia with liquids, dysphagia with solids, heartburn, loss of appetite, nausea, vomiting, vomiting blood, weight loss, and  weight gain.      Reports black tarry stools.     Denies anal fissure, change in bowel habit, constipation, diarrhea, diverticulosis, fecal incontinence, heme positive stool, hemorrhoids, irritable bowel syndrome, jaundice, light color stool, liver problems, rectal bleeding, and  rectal pain.    Current Medications (verified): 1)  Metformin Hcl 850 Mg Tabs (Metformin Hcl) .Marland Kitchen.. 1 By Mouth Bid 2)  Amlodipine Besylate 5 Mg Tabs (Amlodipine Besylate) .Marland Kitchen.. 1 By Mouth Qd 3)  Coreg 25 Mg Tabs (Carvedilol) .Marland Kitchen.. 1 By Mouth Two Times A  Day 4)  Torsemide 20 Mg  Tabs (Torsemide) .... Take 1 By Mouth Qd 5)  Allopurinol 300 Mg  Tabs (Allopurinol) .Marland Kitchen.. 1 Once Daily 6)  Amitriptyline Hcl 25 Mg  Tabs (Amitriptyline Hcl) .Marland Kitchen.. 1 Once Daily At [facility] Prn 7)  Triamcinolone Acetonide 0.5 % Crea (Triamcinolone Acetonide) .... Two Times A Day Prn 8)  Vitamin D3 2000 Unit Caps (Cholecalciferol) .... Take One By Mouth Once Daily 9)  Bactroban 2 %  Oint (Mupirocin) .... Use Qid Prn 10)  Terazol 7 0.4 % Crea (Terconazole) .... Use Pv X 7 D As Needed Hs 11)  Aspirin 81 Mg  Tbec (Aspirin) .... One By Mouth Every Day 12)  Levsin/sl 0.125 Mg Subl (Hyoscyamine Sulfate) .... Take 1 Under Tongue Every 4-6 Hours As Needed For Pain and Spasms 13)  Pantoprazole Sodium 40 Mg Tbec (Pantoprazole Sodium) .... Take 1 Tablet By Mouth Two Times A Day 14)  Pennsaid 1.5 % Soln (Diclofenac Sodium) .... 3-5 Gtt On Skin Three Times A Day For Pain 15)  Ibuprofen 600 Mg Tabs (Ibuprofen) .Marland Kitchen.. 1 By Mouth Bid  Pc X 1 Wk Then As Needed For  Pain 16)  Eye Drops .... Once Daily  Allergies (verified): 1)  ! Aspirin Ec (Aspirin) 2)  ! Codeine Sulfate (Codeine Sulfate) 3)  ! Diovan (Valsartan) 4)  ! Motrin Ib (Ibuprofen) 5)  Lisinopril (Lisinopril) 6)  Verapamil 7)  Hydrochlorothiazide 8)  Atenolol  Past History:  Past Medical History: Reviewed history from 07/10/2010 and no changes required. Colonic polyps, hx of Diabetes mellitus,  type II Diverticulitis, hx of Gout Hypertension Osteoarthritis Hyperlipidemia Pulmonary HTN L hydronephrosis L adrenal adenoma 2006 GERD w/esophaageal spasm 2011 Boils 2009 Cerebrovascular accident, cerebellar 2010 Low back pain cholelithiasis asympt. with a nl HIDA 4/11 Stress  Past Surgical History: Reviewed history from 03/27/2010 and no changes required. SIGMOID COLECTOMY 2006 Hysterectomy Total knee replacement L 2003 R rot cuff surg 08 Right Breast Biopsy Knee replacement artho Surgical Intervention for bilateral  hydronephrosis Cataract Extraction -Bilateral Bilateral Foot Reconstruction Vericose Vein Stripping-lower extremities hammer toe surgery back surgery  Family History: Reviewed history from 03/27/2010 and no changes required. Family History Diabetes 1st degree relative: Family History Hypertension Family History of Heart Disease:  No FH of Colon Cancer: Family History of Prostate Cancer: father, maternal uncle Family History of Breast Cancer: daughter  Social History: Reviewed history from 06/14/2009 and no changes required. Retired Married Never Smoked Alcohol Use - no Illicit Drug Use - no  Review of Systems       The patient complains of arthritis/joint pain.  The patient denies allergy/sinus, anemia, anxiety-new, back pain, blood in urine, breast changes/lumps, change in vision, confusion, cough, coughing up blood, depression-new, fainting, fatigue, fever, headaches-new, hearing problems, heart murmur, heart rhythm changes, itching, menstrual pain, muscle pains/cramps, night sweats, nosebleeds, pregnancy symptoms, shortness of breath, skin rash, sleeping problems, sore throat, swelling of feet/legs, swollen lymph glands, thirst - excessive , urination - excessive , urination changes/pain, urine leakage, vision changes, and voice change.         Pertinent positive and negative review of systems were noted in the above HPI. All other ROS was otherwise negative.   Vital Signs:  Patient profile:   77 year old female Height:      64 inches Weight:      190 pounds BMI:     32.73 BSA:     1.92 Pulse rate:   60 / minute Pulse rhythm:   regular BP sitting:   132 / 80  (left arm) Cuff size:   regular  Vitals Entered By: Ok Anis CMA (January 01, 2011 4:03 PM)  Physical Exam  General:  Well developed, well nourished, no acute distress. Eyes:  PERRLA, no icterus. Mouth:  No deformity or lesions, dentition normal. Neck:  Supple; no masses or thyromegaly. Lungs:  Clear  throughout to auscultation. Heart:  Regular rate and rhythm; no murmurs, rubs,  or bruits. Abdomen:  protuberant abdomen. Soft. Nontender. I could not elicit any tenderness. All quadrants were normal. Bowel sounds were normal active. There was no palpable mass. Rectal:  rectal exam reveals normal perianal area with 3 small skin tags, normal rectal sphincter tone. Small amount of soft Hemoccult negative stool. There was no prolapse of the rectal mucosa.   Impression & Recommendations:  Problem # 1:  FULL INCONTINENCE OF FECES (ICD-787.60) Patient has had rectal leakage which may be related to a decreased sphincter tone or possibly due to incomplete rectal evacuation. She has a history of extensive diverticulosis and is status post sigmoid resection. She is due for a repeat colonoscopy. We will go ahead with the colonoscopy at this time. I have given her samples of Calmoseptine ointment to use around the rectum. Orders: Colonoscopy (Colon)  Problem # 2:  CHOLELITHIASIS (ICD-574.2) Patient has known cholelithiasis with a normal HIDA scan. There has been mild elevation of her alkaline phosphatase. Patient does not complain of any food intolerance or dyspepsia and so there is no indication for a cholecystectomy at this time.  Patient Instructions: 1)  You have been scheduled for a colonoscopy. Please follow written prep instructions that were given to you today at your visit. 2)  Please pick up your prescriptions at the pharmacy. Electronic prescription(s) has already been sent. 3)  common 17 ointment samples use twice a day as needed for rectal irritation 4)  no plans for gallbladder removal at this time since HIDA scan is negative  5)  Copy sent to : Dr A.Plotnikov 6)  The medication list was reviewed and reconciled.  All changed / newly prescribed medications were explained.  A complete medication list was provided to the patient / caregiver. Prescriptions: DULCOLAX 5 MG  TBEC (BISACODYL) Day  before procedure take 2 at 3pm and 2 at 8pm.  #4 x 0   Entered by:   Lamona Curl CMA (AAMA)   Authorized by:   Hart Carwin MD   Signed by:   Lamona Curl CMA (AAMA) on 01/01/2011   Method used:   Electronically to        [facility]  [address] [phone]* (retail)       [address]       [place], [place]  [id number]       Ph: [phone] or [phone]       Fax: [phone]   RxID:   [id number] REGLAN 10 MG  TABS (METOCLOPRAMIDE HCL) As per prep instructions.  #2 x 0   Entered by:   Lamona Curl CMA (AAMA)   Authorized by:   Hart Carwin MD   Signed by:   Lamona Curl CMA (AAMA) on 01/01/2011   Method used:   [facility]ron[facility] to        [facility]  [address] [phone]* (retail)       [address]       [place], [place]  [id number]       Ph: [phone] or [phone]       Fax: [phone]   RxID:   [id number] MIRALAX   POWD (POLYETHYLENE GLYCOL 3350) As per prep  instructions.  #255gm x 0   Entered by:   Lamona Curl CMA (AAMA)   Authorized by:   Hart Carwin MD   Signed by:   Lamona Curl CMA (AAMA) on 01/01/2011   Method used:   [facility]        [facility]  [address] [phone]* (retail)       [address]       [place], [place]  [id number]       Ph: [phone] or [phone]       Fax: [phone]   RxID:   [id number]

## 2011-01-25 NOTE — Procedures (Signed)
Summary: Colonoscopy  Patient: [name] Note: All result statuses are Final unless otherwise noted.  Tests: (1) Colonoscopy (COL)   COL Colonoscopy           DONE     [facility]     [address]     [place], [place]  [address]           COLONOSCOPY PROCEDURE REPORT           PATIENT:  Breanna [name], Breanna [name]  MR#:  [id number]     BIRTHDATE:  1931/02/09, 79 yrs. old  GENDER:  female     ENDOSCOPIST:  Hedwig Morton. Juanda Chance, MD     REF. BY:     PROCEDURE DATE:  01/09/2011     PROCEDURE:  Colonoscopy 86761     ASA CLASS:  Class II     INDICATIONS:  fecal incontinence s/p sigmoid resection 2006 for     diverticular disease     cecal tear during surgery, was repaired     last colon 2005     MEDICATIONS:   Versed 4 mg, Fentanyl 50 mcg           DESCRIPTION OF PROCEDURE:   After the risks benefits and     alternatives of the procedure were thoroughly explained, informed     consent was obtained.  Digital rectal exam was performed and     revealed no rectal masses.   The LB [id number]-[id number] endoscope     was introduced through the anus and advanced to the cecum, which     was identified by both the appendix and ileocecal valve, without     limitations.  The quality of the prep was good, using MiraLax.     The instrument was then slowly withdrawn as the colon was fully     examined.     <<PROCEDUREIMAGES>>           FINDINGS:  Mild diverticulosis was found in the sigmoid colon (see     image1).  There was evidence of a prior segmental colectomy. in     the sigmoid colon (see image6 and image8). 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 large opening throughout the ileocecal valve into the     cecal pouch, likely 2dary to cecal perforationduring surgery     Otherwise normal colonoscopy without other polyps, masses, vascular     ectasias, or inflammatory changes (see image3, image4, and     image7).   Retroflexed views in  the rectum revealed no     abnormalities.    The scope was then withdrawn from the patient     and the procedure completed.           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  no recall due to age ( 10 year  recall)           ______________________________     Hedwig Morton. Juanda Chance, MD           CC:           n.     eSIGNED:   Hedwig Morton. Adis Sturgill at 01/09/2011 11:06 AM           Cory Munch, [id number]  Note: An exclamation mark (!) indicates a result that was not dispersed into the flowsheet. Document Creation Date: 01/09/2011 11:07 AM _______________________________________________________________________  (1) Order result status: Final Collection or observation date-time: 01/09/2011 10:56 Requested date-time:  Receipt date-time:  Reported date-time:  Referring Physician:   Ordering Physician: Lina Sar [id number]) Specimen Source:  Source: Launa Grill Order Number: [id number] Lab site:

## 2011-01-26 NOTE — Medication Information (Signed)
Summary: Pantoprazole/[facility]  Pantoprazole/[facility]   Imported By: Sherian Rein 04/13/2010 12:24:12  _____________________________________________________________________  External Attachment:    Type:   Image     Comment:   External Document

## 2011-01-26 NOTE — Medication Information (Signed)
Summary: Rx Diabetic Testing Supplies/[facility]  Rx Diabetic Testing Supplies/[facility]   Imported By: Esmeralda Links D'jimraou 10/01/2008 12:01:41  _____________________________________________________________________  External Attachment:    Type:   Image     Comment:   External Document

## 2011-01-26 NOTE — Medication Information (Signed)
Summary: Diabetic supplies/[facility]  Diabetic supplies/[facility]   Imported By: Lester Garrison 01/18/2010 14:50:03  _____________________________________________________________________  External Attachment:    Type:   Image     Comment:   External Document

## 2011-01-26 NOTE — Medication Information (Signed)
Summary: Approved Nexium / [facility]  Approved Nexium / [facility]   Imported By: Lennie Odor 07/06/2010 09:15:26  _____________________________________________________________________  External Attachment:    Type:   Image     Comment:   External Document

## 2011-01-26 NOTE — Letter (Signed)
Summary: [facility]  [facility]   Imported By: Lennie Odor 11/08/2010 16:12:47  _____________________________________________________________________  External Attachment:    Type:   Image     Comment:   External Document

## 2011-01-26 NOTE — Letter (Signed)
Summary: [facility]  [facility]   Imported By: Lennie Odor 11/08/2010 16:14:28  _____________________________________________________________________  External Attachment:    Type:   Image     Comment:   External Document

## 2011-01-31 NOTE — Medication Information (Signed)
Summary: Diabetes Supplies/[facility]  Diabetes Supplies/[facility]   Imported By: Sherian Rein 01/25/2011 12:11:33  _____________________________________________________________________  External Attachment:    Type:   Image     Comment:   External Document

## 2011-02-23 ENCOUNTER — Encounter: Payer: Self-pay | Admitting: Internal Medicine

## 2011-03-06 NOTE — Letter (Signed)
Summary: Statement of Certifying Physician/James Marinus Maw MD  Statement of Certifying Physician/James Marinus Maw MD   Imported By: Sherian Rein 02/26/2011 10:02:18  _____________________________________________________________________  External Attachment:    Type:   Image     Comment:   External Document

## 2011-03-13 LAB — GLUCOSE, CAPILLARY
Glucose-Capillary: 117 mg/dL — ABNORMAL HIGH (ref 70–99)
Glucose-Capillary: 128 mg/dL — ABNORMAL HIGH (ref 70–99)

## 2011-03-14 LAB — DIFFERENTIAL
Basophils Absolute: 0 10*3/uL (ref 0.0–0.1)
Basophils Relative: 0 % (ref 0–1)
Eosinophils Absolute: 0.3 10*3/uL (ref 0.0–0.7)
Eosinophils Relative: 5 % (ref 0–5)
Lymphocytes Relative: 19 % (ref 12–46)
Lymphs Abs: 1.2 10*3/uL (ref 0.7–4.0)
Monocytes Absolute: 0.6 10*3/uL (ref 0.1–1.0)
Monocytes Relative: 10 % (ref 3–12)
Neutro Abs: 4.1 10*3/uL (ref 1.7–7.7)
Neutrophils Relative %: 67 % (ref 43–77)

## 2011-03-14 LAB — URINALYSIS, ROUTINE W REFLEX MICROSCOPIC
Bilirubin Urine: NEGATIVE
Glucose, UA: NEGATIVE mg/dL
Hgb urine dipstick: NEGATIVE
Ketones, ur: NEGATIVE mg/dL
Nitrite: NEGATIVE
Protein, ur: NEGATIVE mg/dL
Specific Gravity, Urine: 1.009 (ref 1.005–1.030)
Urobilinogen, UA: 0.2 mg/dL (ref 0.0–1.0)
pH: 6 (ref 5.0–8.0)

## 2011-03-14 LAB — COMPREHENSIVE METABOLIC PANEL
ALT: 14 U/L (ref 0–35)
AST: 19 U/L (ref 0–37)
Albumin: 3.7 g/dL (ref 3.5–5.2)
Alkaline Phosphatase: 104 U/L (ref 39–117)
BUN: 30 mg/dL — ABNORMAL HIGH (ref 6–23)
CO2: 26 mEq/L (ref 19–32)
Calcium: 9.5 mg/dL (ref 8.4–10.5)
Chloride: 101 mEq/L (ref 96–112)
Creatinine, Ser: 1.47 mg/dL — ABNORMAL HIGH (ref 0.4–1.2)
GFR calc Af Amer: 42 mL/min — ABNORMAL LOW (ref >60)
GFR calc non Af Amer: 35 mL/min — ABNORMAL LOW (ref >60)
Glucose, Bld: 126 mg/dL — ABNORMAL HIGH (ref 70–99)
Potassium: 4.9 mEq/L (ref 3.5–5.1)
Sodium: 136 mEq/L (ref 135–145)
Total Bilirubin: 0.6 mg/dL (ref 0.3–1.2)
Total Protein: 7.2 g/dL (ref 6.0–8.3)

## 2011-03-14 LAB — CBC
HCT: 36.4 % (ref 36.0–46.0)
Hemoglobin: 11.9 g/dL — ABNORMAL LOW (ref 12.0–15.0)
MCHC: 32.7 g/dL (ref 30.0–36.0)
MCV: 90.4 fL (ref 78.0–100.0)
Platelets: 175 10*3/uL (ref 150–400)
RBC: 4.03 MIL/uL (ref 3.87–5.11)
RDW: 17.8 % — ABNORMAL HIGH (ref 11.5–15.5)
WBC: 6.2 10*3/uL (ref 4.0–10.5)

## 2011-03-14 LAB — URINE MICROSCOPIC-ADD ON

## 2011-03-14 LAB — POCT CARDIAC MARKERS
CKMB, poc: <1 ng/mL — ABNORMAL LOW (ref 1.0–8.0)
CKMB, poc: <1 ng/mL — ABNORMAL LOW (ref 1.0–8.0)
Myoglobin, poc: 143 ng/mL (ref 12–200)
Myoglobin, poc: 84.6 ng/mL (ref 12–200)
Troponin i, poc: <0.05 ng/mL (ref 0.00–0.09)
Troponin i, poc: <0.05 ng/mL (ref 0.00–0.09)

## 2011-03-14 LAB — LIPASE, BLOOD: Lipase: 22 U/L (ref 11–59)

## 2011-03-28 LAB — GLUCOSE, CAPILLARY: Glucose-Capillary: 147 mg/dL — ABNORMAL HIGH (ref 70–99)

## 2011-03-30 LAB — URINALYSIS, ROUTINE W REFLEX MICROSCOPIC
Bilirubin Urine: NEGATIVE
Glucose, UA: NEGATIVE mg/dL
Hgb urine dipstick: NEGATIVE
Ketones, ur: NEGATIVE mg/dL
Nitrite: NEGATIVE
Protein, ur: 100 mg/dL — AB
Specific Gravity, Urine: 1.016 (ref 1.005–1.030)
Urobilinogen, UA: 0.2 mg/dL (ref 0.0–1.0)
pH: 5.5 (ref 5.0–8.0)

## 2011-03-30 LAB — URINE CULTURE: Colony Count: 45000

## 2011-03-30 LAB — DIFFERENTIAL
Basophils Absolute: 0 10*3/uL (ref 0.0–0.1)
Basophils Relative: 0 % (ref 0–1)
Eosinophils Absolute: 0.3 10*3/uL (ref 0.0–0.7)
Eosinophils Relative: 4 % (ref 0–5)
Lymphocytes Relative: 23 % (ref 12–46)
Lymphs Abs: 1.4 10*3/uL (ref 0.7–4.0)
Monocytes Absolute: 0.6 10*3/uL (ref 0.1–1.0)
Monocytes Relative: 10 % (ref 3–12)
Neutro Abs: 4 10*3/uL (ref 1.7–7.7)
Neutrophils Relative %: 63 % (ref 43–77)

## 2011-03-30 LAB — URINE MICROSCOPIC-ADD ON

## 2011-03-30 LAB — CBC
HCT: 38.5 % (ref 36.0–46.0)
Hemoglobin: 12.5 g/dL (ref 12.0–15.0)
MCHC: 32.5 g/dL (ref 30.0–36.0)
MCV: 88.7 fL (ref 78.0–100.0)
Platelets: 177 10*3/uL (ref 150–400)
RBC: 4.35 MIL/uL (ref 3.87–5.11)
RDW: 17 % — ABNORMAL HIGH (ref 11.5–15.5)
WBC: 6.4 10*3/uL (ref 4.0–10.5)

## 2011-03-30 LAB — BASIC METABOLIC PANEL
BUN: 19 mg/dL (ref 6–23)
CO2: 30 mEq/L (ref 19–32)
Calcium: 9.6 mg/dL (ref 8.4–10.5)
Chloride: 103 mEq/L (ref 96–112)
Creatinine, Ser: 0.91 mg/dL (ref 0.4–1.2)
GFR calc Af Amer: >60 mL/min (ref >60)
GFR calc non Af Amer: >60 mL/min (ref >60)
Glucose, Bld: 110 mg/dL — ABNORMAL HIGH (ref 70–99)
Potassium: 3.6 mEq/L (ref 3.5–5.1)
Sodium: 143 mEq/L (ref 135–145)

## 2011-04-01 LAB — GLUCOSE, CAPILLARY
Glucose-Capillary: 104 mg/dL — ABNORMAL HIGH (ref 70–99)
Glucose-Capillary: 93 mg/dL (ref 70–99)

## 2011-04-03 ENCOUNTER — Other Ambulatory Visit (INDEPENDENT_AMBULATORY_CARE_PROVIDER_SITE_OTHER): Payer: Self-pay

## 2011-04-03 ENCOUNTER — Other Ambulatory Visit: Payer: Self-pay | Admitting: Internal Medicine

## 2011-04-03 DIAGNOSIS — Z0000 Encounter for general adult medical examination without abnormal findings: Secondary | ICD-10-CM

## 2011-04-03 DIAGNOSIS — E119 Type 2 diabetes mellitus without complications: Secondary | ICD-10-CM

## 2011-04-03 DIAGNOSIS — R209 Unspecified disturbances of skin sensation: Secondary | ICD-10-CM

## 2011-04-03 LAB — CBC WITH DIFFERENTIAL/PLATELET
Basophils Absolute: 0 10*3/uL (ref 0.0–0.1)
Basophils Relative: 0.3 % (ref 0.0–3.0)
Eosinophils Absolute: 0.2 10*3/uL (ref 0.0–0.7)
Eosinophils Relative: 5 % (ref 0.0–5.0)
HCT: 34.6 % — ABNORMAL LOW (ref 36.0–46.0)
Hemoglobin: 11.3 g/dL — ABNORMAL LOW (ref 12.0–15.0)
Lymphocytes Relative: 24.1 % (ref 12.0–46.0)
Lymphs Abs: 1.1 10*3/uL (ref 0.7–4.0)
MCHC: 32.8 g/dL (ref 30.0–36.0)
MCV: 88 fl (ref 78.0–100.0)
Monocytes Absolute: 0.4 10*3/uL (ref 0.1–1.0)
Monocytes Relative: 9.2 % (ref 3.0–12.0)
Neutro Abs: 2.9 10*3/uL (ref 1.4–7.7)
Neutrophils Relative %: 61.4 % (ref 43.0–77.0)
Platelets: 169 10*3/uL (ref 150.0–400.0)
RBC: 3.94 Mil/uL (ref 3.87–5.11)
RDW: 17.8 % — ABNORMAL HIGH (ref 11.5–14.6)
WBC: 4.7 10*3/uL (ref 4.5–10.5)

## 2011-04-03 LAB — URINALYSIS, ROUTINE W REFLEX MICROSCOPIC
Bilirubin Urine: NEGATIVE
Hgb urine dipstick: NEGATIVE
Ketones, ur: NEGATIVE
Nitrite: NEGATIVE
Specific Gravity, Urine: 1.01 (ref 1.000–1.030)
Urine Glucose: NEGATIVE
Urobilinogen, UA: 0.2 (ref 0.0–1.0)
pH: 7.5 (ref 5.0–8.0)

## 2011-04-03 LAB — HEMOGLOBIN A1C: Hgb A1c MFr Bld: 7.3 % — ABNORMAL HIGH (ref 4.6–6.5)

## 2011-04-03 LAB — HEPATIC FUNCTION PANEL
ALT: 13 U/L (ref 0–35)
AST: 16 U/L (ref 0–37)
Albumin: 3.4 g/dL — ABNORMAL LOW (ref 3.5–5.2)
Alkaline Phosphatase: 99 U/L (ref 39–117)
Bilirubin, Direct: 0.1 mg/dL (ref 0.0–0.3)
Total Bilirubin: 0.4 mg/dL (ref 0.3–1.2)
Total Protein: 6.3 g/dL (ref 6.0–8.3)

## 2011-04-03 LAB — BASIC METABOLIC PANEL
BUN: 19 mg/dL (ref 6–23)
CO2: 30 mEq/L (ref 19–32)
Calcium: 9.1 mg/dL (ref 8.4–10.5)
Chloride: 106 mEq/L (ref 96–112)
Creatinine, Ser: 0.9 mg/dL (ref 0.4–1.2)
GFR: 83.27 mL/min (ref >60.00)
Glucose, Bld: 89 mg/dL (ref 70–99)
Potassium: 4.3 mEq/L (ref 3.5–5.1)
Sodium: 143 mEq/L (ref 135–145)

## 2011-04-03 LAB — LIPID PANEL
Cholesterol: 148 mg/dL (ref 0–200)
HDL: 47.3 mg/dL (ref >39.00)
LDL Cholesterol: 84 mg/dL (ref 0–99)
Total CHOL/HDL Ratio: 3
Triglycerides: 86 mg/dL (ref 0.0–149.0)
VLDL: 17.2 mg/dL (ref 0.0–40.0)

## 2011-04-03 LAB — TSH: TSH: 0.88 u[IU]/mL (ref 0.35–5.50)

## 2011-04-03 LAB — VITAMIN B12: Vitamin B-12: >1500 pg/mL — ABNORMAL HIGH (ref 211–911)

## 2011-04-03 LAB — SEDIMENTATION RATE: Sed Rate: 30 mm/hr — ABNORMAL HIGH (ref 0–22)

## 2011-04-06 ENCOUNTER — Ambulatory Visit (INDEPENDENT_AMBULATORY_CARE_PROVIDER_SITE_OTHER)
Admission: RE | Admit: 2011-04-06 | Discharge: 2011-04-06 | Disposition: A | Discharge status: Home / Self Care | Payer: PRIVATE HEALTH INSURANCE | Source: Ambulatory Visit | Attending: Internal Medicine | Admitting: Internal Medicine

## 2011-04-06 ENCOUNTER — Other Ambulatory Visit: Payer: PRIVATE HEALTH INSURANCE

## 2011-04-06 ENCOUNTER — Encounter: Payer: Self-pay | Admitting: Internal Medicine

## 2011-04-06 ENCOUNTER — Ambulatory Visit (INDEPENDENT_AMBULATORY_CARE_PROVIDER_SITE_OTHER): Payer: PRIVATE HEALTH INSURANCE | Admitting: Internal Medicine

## 2011-04-06 ENCOUNTER — Telehealth: Payer: Self-pay | Admitting: Internal Medicine

## 2011-04-06 DIAGNOSIS — I10 Essential (primary) hypertension: Secondary | ICD-10-CM

## 2011-04-06 DIAGNOSIS — N390 Urinary tract infection, site not specified: Secondary | ICD-10-CM

## 2011-04-06 DIAGNOSIS — M65839 Other synovitis and tenosynovitis, unspecified forearm: Secondary | ICD-10-CM

## 2011-04-06 DIAGNOSIS — E119 Type 2 diabetes mellitus without complications: Secondary | ICD-10-CM

## 2011-04-06 DIAGNOSIS — M65849 Other synovitis and tenosynovitis, unspecified hand: Secondary | ICD-10-CM

## 2011-04-06 DIAGNOSIS — M25531 Pain in right wrist: Secondary | ICD-10-CM | POA: Insufficient documentation

## 2011-04-06 DIAGNOSIS — M25539 Pain in unspecified wrist: Secondary | ICD-10-CM

## 2011-04-06 DIAGNOSIS — E785 Hyperlipidemia, unspecified: Secondary | ICD-10-CM

## 2011-04-06 NOTE — Telephone Encounter (Signed)
Breanna [name] , please, inform the patient:  the wrist xray was normal   Please, keep  next office visit appointment.   Thank you !

## 2011-04-06 NOTE — Assessment & Plan Note (Signed)
Controlled with meds

## 2011-04-06 NOTE — Assessment & Plan Note (Signed)
S/p fall on 1/25 - she had xrays. It is better but hurts a lot and the swelling keeps coming back  Procedure Note :    Procedure :   Sonography examination   Indication: R rad wrist pain  Repeat X ray

## 2011-04-06 NOTE — Patient Instructions (Addendum)
Use Pennsaid on R wrist Come for a wrist injection in 2-3 wks if not better

## 2011-04-06 NOTE — Assessment & Plan Note (Signed)
Cont with meds

## 2011-04-06 NOTE — Progress Notes (Signed)
  Subjective:    Patient ID: Breanna [name], female    DOB: 10-05-1931, 77 y.o.   MRN: [id number]  HPI  The patient presents for a well exam and for a follow-up of  chronic hypertension, chronic dyslipidemia, type 2 diabetes controlled with medicines  C/o R wrist pain after a fall in January. Xray showed no fracture. It cont. to hurt  Review of Systems  Constitutional: Negative for chills and activity change.  HENT: Negative for ear pain.   Eyes: Negative for redness.  Respiratory: Negative for shortness of breath.   Cardiovascular: Negative for chest pain and leg swelling.  Genitourinary: Negative for flank pain and difficulty urinating.  Musculoskeletal: Positive for joint swelling (R rad wrist - palmat side). Negative for arthralgias.  Skin: Negative for rash.  Neurological: Negative for speech difficulty and weakness.  Psychiatric/Behavioral: Negative for agitation. The patient is not nervous/anxious.        Objective:   Physical Exam  Constitutional: She appears well-developed and well-nourished. No distress.  HENT:  Head: Normocephalic.  Right Ear: External ear normal.  Left Ear: External ear normal.  Nose: Nose normal.  Mouth/Throat: Oropharynx is clear and moist.  Eyes: Conjunctivae are normal. Pupils are equal, round, and reactive to light. Right eye exhibits no discharge. Left eye exhibits no discharge.  Neck: Normal range of motion. Neck supple. No JVD present. No tracheal deviation present. No thyromegaly present.  Cardiovascular: Normal rate, regular rhythm and normal heart sounds.   Pulmonary/Chest: No stridor. No respiratory distress. She has no wheezes.  Abdominal: Soft. Bowel sounds are normal. She exhibits no distension and no mass. There is no tenderness. There is no rebound and no guarding.  Musculoskeletal: She exhibits edema (R palmar rad. wrist is tender and swollen) and tenderness.  Lymphadenopathy:    She has no cervical adenopathy.  Neurological: She  displays normal reflexes. No cranial nerve deficit. She exhibits normal muscle tone. Coordination normal.  Skin: No rash noted. No erythema.  Psychiatric: She has a normal mood and affect. Her behavior is normal. Judgment and thought content normal.          Assessment & Plan:   DIABETES MELLITUS, TYPE II Cont with meds. Labs were reviewed.  HYPERLIPIDEMIA Cont with meds  HYPERTENSION Controlled with meds  Wrist pain, right S/p fall on 1/25 - she had xrays. It is better but hurts a lot and the swelling keeps coming back  Procedure Note :    Procedure :   Sonography examination   Indication: R wrist pain    Equipment used : Terason 3000 unit  With 12L5-V linear probe. The images were stored in Terason.  The patient was placed in a sitting position. B wrists were scanned.   This study revealed fluid around flexor carpi radialis tendon  Impression: Flexor carpi radialis tendonitis R.    Repeat X ray

## 2011-04-06 NOTE — Assessment & Plan Note (Signed)
Cont with meds. Labs were reviewed.

## 2011-04-09 NOTE — Telephone Encounter (Signed)
Pt informed

## 2011-04-15 ENCOUNTER — Emergency Department (HOSPITAL_COMMUNITY)
Admission: EM | Admit: 2011-04-15 | Discharge: 2011-04-15 | Disposition: A | Discharge status: Home / Self Care | Payer: No Typology Code available for payment source | Attending: Emergency Medicine | Admitting: Emergency Medicine

## 2011-04-15 DIAGNOSIS — M25569 Pain in unspecified knee: Secondary | ICD-10-CM | POA: Insufficient documentation

## 2011-04-15 DIAGNOSIS — Z8673 Personal history of transient ischemic attack (TIA), and cerebral infarction without residual deficits: Secondary | ICD-10-CM | POA: Insufficient documentation

## 2011-04-15 DIAGNOSIS — Y9210 Unspecified residential institution as the place of occurrence of the external cause: Secondary | ICD-10-CM | POA: Insufficient documentation

## 2011-04-15 DIAGNOSIS — R296 Repeated falls: Secondary | ICD-10-CM | POA: Insufficient documentation

## 2011-04-15 DIAGNOSIS — S8000XA Contusion of unspecified knee, initial encounter: Secondary | ICD-10-CM | POA: Insufficient documentation

## 2011-04-15 DIAGNOSIS — Y999 Unspecified external cause status: Secondary | ICD-10-CM | POA: Insufficient documentation

## 2011-05-08 NOTE — Op Note (Signed)
Breanna [name], BURKEL NO.:  [id number]   MEDICAL RECORD NO.:  [id number]          PATIENT TYPE:  OIB   LOCATION:  5012                         FACILITY:  [facility]   PHYSICIAN:  Myrtie Neither, MD      DATE OF BIRTH:  1931/11/06   DATE OF PROCEDURE:  06/26/2007  DATE OF DISCHARGE:                               OPERATIVE REPORT   PREOPERATIVE DIAGNOSES:  1. Impingement syndrome.  2. Rotator cuff tear, right shoulder.   POSTOPERATIVE DIAGNOSES:  1. Impingement syndrome.  2. Rotator cuff tear, right shoulder.   ANESTHESIA:  General.   PROCEDURE:  1. Arthroscopic acromioplasty and decompression, synovectomy right      shoulder.  2. Mini open rotator cuff repair using Biomet anchor suture.   The patient was taken to the operating room, given adequate preop  medications, given general anesthesia and intubated. The patient was  placed in the barber chair position, right shoulder was prepped with  DuraPrep and draped in a sterile manner.  A 1/2-inch puncture wound made  along the posterior aspect of the shoulder going through the skin and  subcutaneous tissue. A swisher rod was placed posterior to anterior, an  anterior incision for water inflow was then made.  A separate lateral  incision made for the arthroscope.  Inspection of the joint revealed  posterior rotator cuff tear, eburnation and chondromalacia changes of  the subacromial surface, spur, and osteophyte anterolaterally. With the  synovial shaver, complete synovectomy was done followed by use of a bur  for the acromioplasty.  After adequate decompression, debridement of the  subacromial space.  A lateral incision was extended and rotator cuff  tear was identified. A Biomet anchor suture was implanted into the  greater tuberosity and then sutures placed through the cuff tear pulling  it back down to the bony surface.  Copious irrigation was done. Wound  closure was then done, #0 for the fascia, 2-0 for the  subcutaneous and  skin staples for the skin.  A compressive dressing was applied.  The  patient had previous __________  block.  The patient was then placed in  abduction shoulder brace pillow. She tolerated the procedure quite well  and went to the recovery room in stable and satisfactory condition.  The  patient is being admitted and will need possibly nursing home placement.      Myrtie Neither, MD  Electronically Signed     AC/MEDQ  D:  06/26/2007  T:  06/26/2007  Job:  [id number]

## 2011-05-08 NOTE — Discharge Summary (Signed)
NAMEREMELL, GIAIMO NO.:  [id number]   MEDICAL RECORD NO.:  [id number]          PATIENT TYPE:  OIB   LOCATION:  5012                         FACILITY:  [facility]   PHYSICIAN:  Myrtie Neither, MD      DATE OF BIRTH:  08-28-1931   DATE OF ADMISSION:  06/26/2007  DATE OF DISCHARGE:  06/30/2007                               DISCHARGE SUMMARY   ADMISSION DIAGNOSES:  1. Impingement syndrome, rotator cuff of right shoulder.  2. History of hypertension.   DISCHARGE DIAGNOSES:  1. Impingement syndrome, rotator cuff of right shoulder.  2. History of hypertension.   COMPLICATIONS:  None.   INFECTIONS:  None.   OPERATION:  Arthroscopic acromioplasty and decompression, right  shoulder, and mini open rotator cuff repair of the right shoulder.   PERTINENT HISTORY:  This is a 77 year old female who has been followed  in the office for impingement syndrome of the right shoulder.  The  patient developed severe onset of pain, weakness, and loss of function  in the right shoulder.  The patient's MRI demonstrated rotator cuff tear  of the right shoulder.   Pertinent physical findings of the right shoulder, anterior and lateral,  limited range of motion with pain on abduction, attempted abduction  above 65 degrees as well as full extension, good grip, intrinsics  intact.   HOSPITAL COURSE:  The patient underwent preop medical evaluation and  found to be stable for surgery and underwent CBC, chest x-ray, CMET, UA.  The patient's laboratories found to be stable to undergo surgery.  The  patient underwent right shoulder arthroscopy and acromioplasty,  decompression, mini rotator cuff repair.  The patient tolerated the  procedure quite well.  Postop course has been benign.  The patient is  presently being considered for extended nursing care facility due to  lack of help at home to assist her.  The patient's husband is ill and  unable to care for her.  Probably await nursing home  facility.   MEDICATIONS:  1. Amitriptyline 25 mg p.o. daily.  2. Allopurinol 30 mg daily.  3. Nexium 40 mg daily.  4. Atenolol 25 mg daily.  5. Avapro 300 mg daily.  6. Actos 45 mg daily.  7. Detrol LA 4 mg daily.  8. Verapamil 180 mg daily.  9. Multivitamins.  10.Fish oil.  11.Timolol eye drops 2 drops daily.  12.Torsemide 20 mg daily.  13.Darvocet-N 100 one to two p.o. q.4 h p.r.n. pain.  14.Ice pack to the right shoulder.   The patient will be seen back in the office one week after discharge  from hospital.  The patient is being discharged in stable condition.      Myrtie Neither, MD  Electronically Signed     AC/MEDQ  D:  06/29/2007  T:  06/29/2007  Job:  [id number]

## 2011-05-08 NOTE — Assessment & Plan Note (Signed)
[facility]                         GASTROENTEROLOGY OFFICE NOTE   NAME:[name], [name]                          MRN:          [id number]  DATE:05/28/2007                            DOB:          [birth year]    Breanna [name] is a very nice lady I have taken care of with recurrent  diverticulitis.  She says she has been doing pretty well, although she  is still having some discomfort in the left lower quadrant, which causes  flare-ups.  Small bowel series was done, though it did not show any  evidence of significance.  A recent CT scan was done in January,  revealed no real significant changes, except for questionable small  bowel.  X-ray did not confirm this.  In any case, she is doing pretty  well.  Bowel activity is normal.  Last colonoscopy was in 2005, which  revealed diverticular disease and some narrowing of the lumen.  Dr.  Juanda Chance saw her then.   PHYSICAL EXAMINATION:  She weighed 211, blood pressure 132/80, pulse 66  and regular.  Neck and upper extremities all unremarkable.  There specifically was no  pain on palpation in the left lower quadrant.  She has had no significant diverticulitis in the past.  Has been treated  with Cipro.   PAST MEDICAL HISTORY:  Reveals hypertension.  Diabetes as well.  She is  mildly obese.   IMPRESSION:  1. Left lower quadrant pain somewhat chronic.  Question recurrent      diverticulitis, though no evidence of this at this time.  2. Mild obesity.  3. Hypertension.  4. Diabetes.   RECOMMENDATIONS:  To have some Cipro, which I gave her, just in case she  has a flare-up she can take some until she gets in touch with her  physician, whom she wants to be Dr. Juanda Chance in the future.  She has seen  Dr. Juanda Chance in the past and liked her very much, so I told her I thought  this would be fine with Dr. Juanda Chance.  I gave her some Nexium that she  takes as well for her GERD.     Ulyess Mort, MD  Electronically Signed    SML/MedQ  DD: 05/28/2007  DT: 05/28/2007  Job #: [id number]

## 2011-05-11 NOTE — H&P (Signed)
Breanna [name] NO.:  [id number]   MEDICAL RECORD NO.:  [id number]          PATIENT TYPE:  INP   LOCATION:  6703                         FACILITY:  [facility]   PHYSICIAN:  Georgina Quint. Plotnikov, M.D. Romualdo Bolk OF BIRTH:  06-07-1931   DATE OF ADMISSION:  06/06/2005  DATE OF DISCHARGE:                                HISTORY & PHYSICAL   CHIEF COMPLAINT:  Abdominal pain, weakness, no appetite, fever with sweats.   HISTORY OF PRESENT ILLNESS:  The patient is a 77 year old female with  recurrent diverticulitis who has surgery scheduled with Dr. Derrell Lolling for June  23rd. I saw her in the office yesterday. She appeared ill. She was having  weight loss, lack of appetite, sweats, fever, increasing weakness. Her labs  done on June 5th revealed elevated white count and sed rate of 88 in spite  of being on Cipro p.o. In my opinion she was failing a course of oral  antibiotics and I decided to admit her for IV antibiotics in treatment of  weight loss, malnutrition, and failure to thrive.   PAST MEDICAL HISTORY:  1.  Diabetes.  2.  Diverticulitis.  3.  Hypertension.   ALLERGIES:  DIOVAN, MOTRIN, CODEINE, ASPIRIN.   CURRENT MEDICATIONS:  1.  Verapamil 360 mg daily.  2.  Niferex two daily.  3.  Protonix 40 mg daily.  4.  K-Dur 20 mEq two daily.  5.  Allopurinol 300 mg daily.  6.  Torsemide 20 mg daily.  7.  Actos 45 mg daily.  8.  Atenolol 25 mg daily.  9.  Avapro 300 mg daily.  10. Fish oil capsules.  11. Multivitamin.  12. Eye drops.  13. Stool softener.  14. Tylenol as needed.  15. Cipro 500 mg p.o. b.i.d.  16. Flagyl three daily.   FAMILY HISTORY:  Positive for diabetes and coronary artery disease.   SOCIAL HISTORY:  She is married. She is retired.   REVIEW OF SYSTEMS:  As above. Denies chest pain or shortness of breath. No  leg swelling. The rest is negative.   PHYSICAL EXAMINATION:  VITAL SIGNS: Blood pressure 127/71, temperature 99.1,  heart rate 72.  GENERAL: She appears exhausted.  HEENT: Dry mucosa.  NECK: Supple. No bruits.  LUNGS: Clear. No wheezing or rales.  HEART: S1 and S2. No murmur or gallop.  ABDOMEN: Soft, tender in the left lower quadrant. No masses felt.  EXTREMITIES: Lower extremities without edema. Calves nontender.  NEUROLOGIC: She is alert, oriented, and cooperative, getting depressed.  SKIN: Clear. No rashes.   LABORATORY DATA:  On May 28, 2005, hemoglobin 11.8, sed rate 88, glucose  102, albumin 3.1, and white count 11.8.   ASSESSMENT/PLAN:  1.  Recurrent refractory diverticulitis. Will discontinue p.o. Cipro. Will      start on IV Unasyn and Flagyl. Obtain surgical consult with Dr. Derrell Lolling      to see if the surgery date can be moved up.  2.  Weight loss. Nutrition consult. Will need to solve problem #1.  3.  Fever. Plan as above. Tylenol p.r.n.  4.  Failure to thrive, malnutrition. Plan as above.  5.  Mild dehydration. Will start on IV fluids.  6.  Type diabetes. Will use insulin sliding scale.  7.  Hypertension. Doing well at present. Will hold diuretics.     __    AVP/MEDQ  D:  06/06/2005  T:  06/06/2005  Job:  [id number]   cc:   Angelia Mould. Derrell Lolling, M.D.  [address]  [place]  [place] [address]   Ulyess Mort, M.D. [facility]

## 2011-05-11 NOTE — H&P (Signed)
[name], [name] NO.:  [id number]   MEDICAL RECORD NO.:  [id number]          PATIENT TYPE:  INP   LOCATION:  3041                         FACILITY:  [facility]   PHYSICIAN:  Malcolm T. Russella Dar, M.D. [facility] OF BIRTH:  [birth year]   DATE OF ADMISSION:  05/18/2005  DATE OF DISCHARGE:                                HISTORY & PHYSICAL   CHIEF COMPLAINT:  Unimproved abdominal pain, despite outpatient antibiotics  with CT scan showing sigmoid diverticulitis earlier today.   HISTORY OF PRESENT ILLNESS:  [name] is a delightful 77 year old African  American female with a history of recurrent sigmoid diverticulitis.  She was  last admitted for inpatient management of diverticulitis in July 2005.  She  had a colonoscopy in late June 2005, showing sigmoid diverticulitis.  This  was done by Dr. Lina Sar.  Since then she has had at least four courses  of antibiotics for recurrent, at least three courses, of outpatient  antibiotics for what was felt to be recurrent diverticulitis with treatments  in September and December 2005, and then late February/March 2006.  She did  have a CT scan, on March 02, 2005, following a course of antibiotics and  this showed mild sigmoid diverticulitis persisting.  About four weeks ago  which would put her in the middle of March, pain recurred.  It was kind of  ebbing and flowing and she finally was able to get in touch with Dr. Victorino Dike, her primary GI doctor's office, and she was started on oral Flagyl  and Cipro with Darvocet-N 100 for pain control last Tuesday.  She has been  on that since and the symptoms have become worse despite these added  medications.  She has not had any nausea or vomiting, though her appetite is  a bit decreased and she has not been taking as much p.o. intake and what she  takes is generally soft foods.  She had a CT scan over at [facility]  office today which confirmed sigmoid diverticulitis but no  abscess, and she  did have a left ureteral observation which is an old problem.  The patient  was contacted and advised to come over for a direct admission to the  hospital and she is now being admitted to unit 3,000.   ALLERGIES:  1.  ASPIRIN.  2.  CODEINE.  3.  DARVON.  4.  IBUPROFEN.  5.  HYDROCHLOROTHIAZIDE.  6.  Possibly to DARVOCET-N 100 as she said that when she takes it, she has      been itching but has not developed a rash.   CURRENT MEDICATIONS:  1.  Verapamil 360 mg p.o. every day.  2.  Hyoscyamine 0.375 mg p.o. every day.  3.  Niferex twice daily.  4.  Protonix 40 mg every day.  5.  Potassium chloride 20 mg twice daily.  6.  Allopurinol 300 mg daily.  7.  Torsemide 20 mg daily.  8.  Actos 40 mg daily.  9.  Atenolol 25 mg daily.  10. Avapro 300 mg daily.  11.  Fish oil one capsule daily.  12. Multivitamin Senior Formula, once daily.  13. She also uses three different eye drops and the only one known that she      uses, after a call to [facility], is Timolol 0.5% solution one drop to      each eye twice daily.  The other two eye drops, she is not sure of what      the name is and she did not bring her meds with her.  14. Stool softener once daily.  15. Milk of Magnesia p.r.n., usually takes this about every other day.   PAST MEDICAL HISTORY:  1.  Sigmoid diverticulitis.  2.  Iron-deficiency anemia.  3.  Status post cardiac catheterization, in March 2005, secondary to      dizziness showing pulmonary hypertension but normal left ventricular      systolic function.  She had no significant coronary disease.  4.  Left ureteral obstruction, in January 2005, at which point Dr.      Patsi Sears performed a cystoscopy and left ureteroscopy and pyelogram.      This showed a completely obstructed left ureter.  5.  History of bilateral hydronephrosis with surgery for this in the 1970s.  6.  Hypertension.  7.  Diabetes mellitus, type 2.  8.  History of spinal stenosis  with a lumbar laminectomy by Dr. Franky Macho in      October 2004.  9.  History of gastroduodenitis.  10. Status post hysterectomy with a history of benign tumor, probably a      fibroid.  11. Status post bunion surgery bilaterally.  12. Glaucoma.  13. Status post cataract surgery, she thinks on both eyes.  14. History of a left adrenal nodule noted at CAT scan, July 2005.  15. Borderline chest and portocaval adenopathy on July 2005 CT scan.  16. Status post colonoscopy, June 22, 2004, by Dr. Lina Sar, showing      sigmoid diverticulitis.  17. Status post left total knee arthroplasty, April 2003, by Dr. Montez Morita.  18. History of gout.   SOCIAL HISTORY:  Lives in [place] with her husband who does not drive.  She does not smoke or drink alcohol.  She is retired from a job working for  the state.   FAMILY HISTORY:  No family history of diverticulitis.   REVIEW OF SYSTEMS:  No fevers, no rigors, no nausea, vomiting.  Does report  about a 40 pound weight loss within the last 12 months.  Has a weak back  which requires her to walk with a cane in order to maintain her balance.  Does not use Goody's or aspirin powders.  No history of palpitations or  chest pain.  Is experiencing some urinary frequency and has chronic bladder  incontinence.  The patient says she has been passing a lot more flatus than  usual.   PHYSICAL EXAMINATION:  GENERAL:  The patient is a pleasant, nontoxic  appearing, African American female who is overweight.  VITAL SIGNS:  Her blood pressure 142/79, pulse is 75, respirations 18,  temperature is 100.3, weight has not yet been obtained.  HEENT:  There is no pallor.  Extraocular movements are intact.  Oropharynx  is moist and clear.  NECK:  There is no JVD, no masses, and no thyromegaly.  CHEST:  Clear to auscultation and percussion.  No shortness of breath, no  cough. COR:  There is a regular rate and rhythm.  No murmurs, rubs, or gallops.  ABDOMEN:  Soft, tender  at the left lower quadrant but no guarding or rebound  associated.  Bowel sounds are active.  RECTAL/GENITOURINARY/BREASTS:  Deferred.  EXTREMITIES:  No cyanosis, clubbing, or edema.  NEUROLOGIC:  No tremors.  Grip and pedal strength are 5/5 bilaterally.  PSYCHIATRIC:  The patient pleasant, does not appear depressed, and is  entirely appropriate.   IMPRESSION:  1.  Sigmoid diverticulitis, recurrent.  This has been a problem for her      several times over the last ten months and she might need a surgical      opinion at this time for a possible resection.  In the meantime, she is      admitted for intravenous antibiotics and analgesia.  2.  Multiple chronic but stable medical problems as described above      including:      1.  Diabetes mellitus.      2.  Iron-deficiency anemia.  3.  Left ureteral obstruction.  She seems to be having a little bit more      urine frequency currently, so we will check a urinalysis and the patient      should be started on intravenous Cipro and Flagyl around the clock and      Phenergan and Demerol available intravenously as needed for symptom      control.  We will plan to continue most of her outpatient medications      including her oral agents and check her blood sugars twice daily.  We      will allow her a diet of full liquids and plan to check routine labs      including a B-MET, CBC with differential, and urinalysis.       SG/MEDQ  D:  05/18/2005  T:  05/18/2005  Job:  [id number]

## 2011-05-11 NOTE — H&P (Signed)
[facility]  Patient:    Breanna [name], Breanna [name] Visit Number: [id number] MRN: [id number]          Service Type: [facility] Location: [id number] Attending Physician:  Faith Rogue T Dictated by:   Kennieth Rad, M.D. Admit Date:  04/10/2002 Discharge Date: 04/15/2002                           History and Physical  CHIEF COMPLAINT:  Painful left knee.  HISTORY OF PRESENT ILLNESS:  This is a 77 year old female who has been followed in the office for degenerative arthritis involving both knees, left knee being more severe.  The patient has been treated with anti-inflammatories, quad exercises, therapeutic injections.  The patients pain progressively worsened with increased immobility.  PAST MEDICAL HISTORY:  Degenerative arthritis, high blood pressure, GERD, diabetes mellitus, cataracts -- bilateral, glaucoma surgery, arthroscopy -- both knees, foot surgery in the past, kidney surgery from blockage in the past.  ALLERGIES:  ASPIRIN, CODEINE, DARVON, MOTRIN all cause her to itch.  MEDICATIONS:  1. Torasemide 20 mg.  2. Kay Ciel 20 mEq.  3. Avandia 4 mg.  4. Zyrtec 10 mg.  5. Mobic 15 mg.  6. Folic acid.  7. Atenolol.  8. Omeprazole 20 mg.  9. Alphagan eye drops. 10. Lotemax eye drops. 11. Nasonex. 12. Timolol eye drops. 13. Avapro 300 mg. 14. Vitamins. 15. Ranitidine 150 mg daily. 16. Doxogan 8 mg at bedtime.  FAMILY HISTORY:  Noncontributory.  HABITS:  None.  REVIEW OF SYSTEMS:  Basically that of history of present illness.  Some shortness of breath from seasonal allergies.  No urinary or bowel symptoms. No cardiac symptoms.  PHYSICAL EXAMINATION:  VITAL SIGNS:  Temperature 97.8, pulse 76, respirations 18, blood pressure 140/70.  Height 5 feet 4 inches.  Weight 212.  HEENT:  Normocephalic.  Eyes:  Conjunctivae and sclerae clear.  NECK:  Supple.  CHEST:  Clear.  CARDIAC:  S1 and S2 regular.  EXTREMITIES:  Left knee:  Genu  varum; crepitus, medial and lateral compartments as well as patellofemoral joint; +2 effusion; lacks full extension; positive McMurrays test; quad muscle tone is good; neurovascular status intact; negative Homans test.  IMPRESSION:  Degenerative joint disease, left knee. Dictated by:   Kennieth Rad, M.D. Attending Physician:  Faith Rogue T DD:  05/13/02 TD:  05/15/02 Job: [id number]

## 2011-05-11 NOTE — Discharge Summary (Signed)
Breanna [name], LISCANO NO.:  [id number]   MEDICAL RECORD NO.:  [id number]                   PATIENT TYPE:  INP   LOCATION:  4737                                 FACILITY:  [facility]   PHYSICIAN:  Carole Binning, M.D. [facility]         DATE OF BIRTH:  11/25/1931   DATE OF ADMISSION:  03/07/2004  DATE OF DISCHARGE:  03/10/2004                                 DISCHARGE SUMMARY   DISCHARGE DIAGNOSES:  1. Admitted with dyspnea and substernal chest pain.  2. Catheterization March 08, 2004.     A. Ejection fraction greater than 60%.     B. Trivial mitral regurgitation.     C. No significant coronary artery disease.     D. Significant pulmonary hypertension with increased right and left heart        filling pressures.  3. Significant diuresis this hospitalization on Lasix 80 mg IV q.12h. for     three doses.  4. Congestive heart failure this admission secondary to diastolic     dysfunction.   SECONDARY DIAGNOSES:  1. Hypertension.  2. Known pulmonary hypertension.  3. Diabetes on oral agents.  4. Lumbar stenosis status post laminectomy.  5. Glaucoma.  6. Gastroesophageal reflux disease.  7. Status post hysterectomy.  8. Status post left lower extremity vein stripping.  9. Status post left total knee arthroplasty.  10.      Bilateral renal surgeries.  11.      Laser surgery in the eye secondary to bleeding.  12.      Degenerative joint disease.  13.      History of anemia.   PROCEDURE:  1. March 08, 2004, left heart catheterization and right heart     catheterization, Dr. Loraine Leriche Pulsipher:  Normal wall motion, ejection     fraction equal to greater than 60%, trivial mitral regurgitation, no     significant coronary artery disease; right coronary artery has 20% mid     point stenosis, left main has 30 to 40% stenosis, proximal LAD has 20%     stenosis. There are pronounced right and left heart filling pressures     with significant pulmonary hypertension.  The patient will be treated with     medical therapy. Diuresis will continue.  2. Computed tomogram of the chest to rule out pulmonary embolism, and the     study was negative for pulmonary embolism, although the D-dimer was 2.2.   DISCHARGE DISPOSITION:  [name] is ready for discharge March 10, 2004.  She is achieving 99% oxygen saturation on room air and has good exercise  tolerance. She has had no cardiac dysrhythmias or respiratory compromise  this hospitalization. She has achieved a good diuresis with IV Lasix. Her  creatinine on discharge was 1.1.   DISCHARGE MEDICATIONS:  [name] is ready for discharge on the following  medications:  1. Her Demadex 20 mg tablets  two tablets daily to start Monday, March 21.  2. Potassium chloride 20 mEq tablets two tablets daily to start Monday,     March 21.  3. Atenolol 25 mg twice daily.  4. Avapro 300 mg daily.  5. Protonix 40 mg daily.  6. Elavil 25 mg daily at bedtime.  7. Verapamil SR 180 mg daily.  8. Allopurinol 100 mg daily.  9. Avandia 4 mg daily.  10.      Decadron 4 mg daily.  11.      Nitroglycerin 0.4 mg one tablet under the tongue every five minutes     for three doses as needed for chest pain.  12.      Indomethacin 75 mg daily as needed for gout breakthrough.  13.      Eye drops as before this admission.  14.      Vitamins as before hospitalization.  15.      For pain, Tylenol 325 mg one to two tablets every four to six hours     as needed for pain.   ACTIVITY:  She is asked not to engage in any heavy lifting for the next  week.   DISCHARGE DIET:  Low sodium, low cholesterol, ADA diet.   DISCHARGE INSTRUCTIONS:  She may shower. She is to call [phone] if she  experiences tenderness or swelling at her catheterization site. She has an  office visit with Dr. Graceann Congress at [facility] office  Wednesday March 29, 2004 at 10:45 in the morning, and she is to make an  appointment to see Dr.  Posey Rea with the following plan:  Possible change  from Avandia and Decadron. Since these medications may increase the tendency  for Ms. Kreisler to retain fluid.   BRIEF HISTORY:  [name] is a 77 year old female with no known history of  coronary artery disease. She does have history of hypertension, pulmonary  hypertension, and she has had no recent stress studies. She complains of one  week history of dyspnea on exertion. She is now experiencing paroxysmal  nocturnal dyspnea, increasing dyspnea on exertion, and with it concomitant  substernal chest pain. She is still having pain when seen in the emergency  room. Last Cardiolite was done April 2003; ejection fraction 59%. The study  was negative for ischemia, negative for infarct. The patient will be  admitted. Electrocardiogram shows no acute changes. Myocardial infarction  will be ruled out. She will be started on heparin and nitroglycerin, and the  plan is for right and left heart catheterization on the morning of March 16.  She does have a history of pulmonary hypertension.   HOSPITAL COURSE:  After admission through the emergency room with increasing  dyspnea and substernal chest pain, the patient was admitted to [facility] to rule out myocardial infarction and with plan of left and right  heart catheterization on the morning of March 16. Her initial troponin I  study was 0.04. She underwent catheterization as mentioned above March 16  with normal left ventricular ejection fraction and no significant coronary  artery disease but significant left and right heart filling pressures.  Medical therapy is recommended. The patient has tolerated the procedure  well. She has had no complications at the catheterization site in the right  groin. She has had no problems with elevated creatinine in the post  procedure period. Her hemoglobin is stable. She has responded nicely to IV Lasix diuresis. The patient is ready for discharge  hospital  day #4 with the  medications and with the follow up as dictated above. Once again, our plan  is for her to see Dr. Corinda Gubler in two weeks and also for her to see Dr.  Posey Rea to discuss possible change from Avandia from Decadron since these  two medications may increase the tendency for Ms. Inabinet to retain fluid.   LABORATORY DATA:  On admission, her alkaline phosphatase was 141, SGOT 51,  SGPT 32. Her latest serum electrolytes on March 18:  Sodium 141, potassium  3.9, chloride 102, bicarbonate 32, BUN 27, creatinine 1.1, glucose 120. D-  dimer was taken on March 17 2.2, and because of this, computed tomogram of  the chest was ordered, and it was negative for pulmonary embolism. Her  complete blood count on March 17:  White cells 9.8, hemoglobin 9.9,  hematocrit 30.1, platelets 218; this is post catheterization complete blood  count. She also had a reticulocyte count done March 18; reticulocyte percent  is 1.5, RBCs 3.9, reticulocyte absolute 58.5 which is within normal limits.      Maple Mirza, P.A.                    Carole Binning, M.D. [facility]    GM/MEDQ  D:  03/10/2004  T:  03/13/2004  Job:  [id number]   cc:   Cecil Cranker, M.D.   Georgina Quint. Plotnikov, M.D. [facility]

## 2011-05-11 NOTE — Discharge Summary (Signed)
NAMEVERLENA, Breanna [name] NO.:  [id number]   MEDICAL RECORD NO.:  [id number]                   PATIENT TYPE:  INP   LOCATION:  3009                                 FACILITY:  [facility]   PHYSICIAN:  Clydene Fake, M.D.               DATE OF BIRTH:  [birth year]   DATE OF ADMISSION:  10/06/2003  DATE OF DISCHARGE:  10/15/2003                                 DISCHARGE SUMMARY   Discharged to rehab.   ADMISSION DIAGNOSES:  Stenosis, all through this one, spondylolisthesis,  degenerative disease.   DISCHARGE DIAGNOSES:  Stenosis, spondylolisthesis, degenerative disease.   PROCEDURE:  Decompression, L3-S1 with posterior __________ pedicle screws  and interbody __________.   REASON FOR ADMISSION:  Patient is a 77 year old woman who __________  stenosis, back and leg pain, Ambulance brought in for decompression and  fusion.   HOSPITAL COURSE:  Patient was admitted the day of surgery and underwent  procedure without complications.  Postoperatively, she was transferred to  the recovery room and then to the floor.  She was doing well, moving legs  well.  Did have some significant back pain and significant flank pain the  day post surgery.  She did have some trouble urinating and needed to be in-  and-out cathed.  PT/OT was consulted, and the rehab service was consulted as  well, as the patient did continue to make slow progress.  X-rays were done  on October 10, 2003 which showed the back in good position along with the  alignment of the spine.  She has continued to have some pain, and the  incision remained clean, dry, and intact.  Rehab tech thought she would be a  good rehab candidate when a bed is available.  On October 15, 2003, she was  discharged with patient escort transferred to rehab for further therapy.                                                Clydene Fake, M.D.    JRH/MEDQ  D:  12/02/2003  T:  12/03/2003  Job:  [id number]

## 2011-05-11 NOTE — Discharge Summary (Signed)
Breanna [name], Breanna [name] NO.:  [id number]   MEDICAL RECORD NO.:  [id number]          PATIENT TYPE:  INP   LOCATION:                               FACILITY:  [facility]   PHYSICIAN:  Rene Paci, M.D. [place] [facility]  DATE OF BIRTH:   DATE OF ADMISSION:  06/06/2005  DATE OF DISCHARGE:  06/19/2005                                 DISCHARGE SUMMARY   DISCHARGE DIAGNOSES:  1.  Status post left colectomy, June 11, 2005, for diverticulitis.  2.  Anemia.   HISTORY OF PRESENT ILLNESS:  The patient  is a 77 year old female who was  admitted with complaints of weakness and abdominal pain.  The patient has a  history of recurrent diverticulitis and was scheduled for surgery on June 15, 2005, however, presented prior to surgery secondary to not feeling well.   PAST MEDICAL HISTORY:  1.  Diabetes, type 2.  2.  Diverticulitis.  3.  Hypertension.   COURSE OF HOSPITALIZATION:  PROBLEM #1 - REFRACTORY DIVERTICULITIS:  The  patient was maintained on Unasyn and Flagyl.  Surgical consult was obtained.  The patient was given IV hydration and blood sugars were carefully  monitored.  The patient underwent surgery on June 11, 2005.  On June 11, 2005, the patient also underwent a cystoscopy and an attempt at insertion of  ureteral stent secondary to hydroureter and hydronephrosis noted on CT of  abdomen.  The patient then underwent a sigmoid colectomy with primary  anastomosis and a repair of a cecal tear by Dr. Jimmye Norman.  The patient  was maintained postoperatively on IV antibiotics and she was given an NG  tube per surgery to low wall suction.  Diet was advanced.  The patient was  noted to be anemic postoperatively with a hemoglobin of 7.9 and transfusion  was ordered by surgical team.   The patient's blood pressure was noted to be elevated postoperatively.  The  patient was returned to her home blood pressure medications once she was  tolerating p.o.'s.   MEDICATIONS AT DISCHARGE:  1.   Verapamil 360 mg p.o. daily.  2.  Iron one tablet p.o. b.i.d.  3.  Protonix 40 mg p.o. daily.  4.  K-Dur 20 mg p.o. daily.  5.  Allopurinol 300 mg p.o. daily.  6.  Torsemide 20 mg p.o. daily.  7.  Actos 45 mg p.o. daily.  8.  Atenolol 25 mg p.o. daily.  9.  Avapro 300 mg p.o. daily.   LABORATORIES AT DISCHARGE:  Hemoglobin 9.8, hematocrit 30.3, platelet count  225,000, white blood cell count 4.8.  BUN 14, creatinine 0.9.   FOLLOWUP:  A followup appointment was scheduled for the patient to see Dr.  Jimmye Norman on July 17, 2005 and for the patient to follow up with Dr.  Posey Rea in approximately 2 weeks or as needed.      Melissa S. Peggyann Juba, NP      Rene Paci, M.D. [facility]  Electronically Signed    MSO/MEDQ  D:  08/15/2005  T:  08/16/2005  Job:  [id number]  cc:   Georgina Quint. Plotnikov, M.D. [facility]  [address]  [place]  [place] [address]

## 2011-05-11 NOTE — Op Note (Signed)
Breanna [name], Breanna [name] NO.:  [id number]   MEDICAL RECORD NO.:  [id number]          PATIENT TYPE:  INP   LOCATION:  5704                         FACILITY:  [facility]   PHYSICIAN:  Cherylynn Ridges, M.D.    DATE OF BIRTH:  11-08-1931   DATE OF PROCEDURE:  06/11/2005  DATE OF DISCHARGE:                                 OPERATIVE REPORT   PREOPERATIVE DIAGNOSIS:  Sigmoid diverticulitis with continued pain.   POSTOPERATIVE DIAGNOSIS:  Sigmoid diverticulitis with continued pain.   PROCEDURE:  1.  Sigmoid colectomy with primary anastomosis.  2.  Repair of cecal tear.   SURGEON:  Jimmye Norman, M.D.   ASSISTANT:  Gabrielle Dare. Janee Morn, M.D.   ANESTHESIA:  General endotracheal anesthesia.   ESTIMATED BLOOD LOSS:  300 mL.   COMPLICATIONS:  Cecal perforation.   CONDITION:  Fair.   INDICATIONS FOR PROCEDURE:  The patient is a 77 year old who has had  unrelenting problems with sigmoid diverticulitis since the end of May who  now comes in for semi-elective colectomy.   FINDINGS:  The patient had severe diverticulitis involving the sigmoid colon  with adherent cecum to the anterolateral aspect.  Dr. Brunilda Payor attempted a  preoperative cystoscopy with stent placement and was unsuccessful.  We did  not visualize the ureters, however, we stayed very close to the colon  throughout the case and no blood or problems of urine output were noted.   OPERATION:  The patient was taken to the operating room and placed on the  table in supine position.  After an adequate endotracheal anesthetic was  administered, she was prepped and draped in the usual sterile manner  exposing the midline of the abdomen.  We took the initial incision from just  above the umbilicus down to the pubic crest.  We took it down to and through  the midline fascia where we did encounter some adhesions from her previous  ureteral and kidney surgery.  I used Kocher clamps to tent up on the fascia  as we took down the  adhesions using Metzenbaum scissors.  We were able to do  so until we got into the free open peritoneal space, however, we did have to  continue to take down omental adhesions from the pelvis in order to free up  the distal portion of the colon which was seen to be the inflammatory area.  The point of inflammation was in the distal sigmoid colon and proximal  rectum.  The cecum, which was markedly distended and dilated, likely  indicating some type of distal obstruction, was plastered to this area of  inflammation.  As we were taking it off, it tore full thickness spilling a  small amount of liquid bowel prep to fecal material into the area.  This was  rapidly controlled and subsequently sutured after a Satinsky clamp was  placed on the bowel, and then sutured with 2-0 silk sutures.  There was no  further spillage from that area.  We repaired the cecal tear and perforation  using silk sutures and then we subsequently mobilized it  away from the area,  but it remained markedly distended during most of the case.  It turns out  that the entire colon up to the area of the sigmoid inflammation was  markedly dilated and it seems as though this inflammatory area was the site  of, at least, high grade partial colonic obstruction.  We continued to  mobilize the sigmoid colon and actually had to mobilize the proximal  descending colon up to and proximal to the splenic flexure in order to get  adequate mobilization for our subsequent anastomosis.  We transected the  distal descending colon, proximal sigmoid, with a TA-90 blue stapler and  then subsequently took down the mesentery of this sigmoid colon which was  plastered posterior and laterally using Kelly clamps and 2-0 silk ties.  We  stayed very close to the sigmoid colon during this case to make sure that  the ureters, which could not be seen, but indigo carmine had bene injected,  and were not injured.  Distally, we used a reticulating 60-TA  stapler to  come across the distal colon and subsequently took the mesentery between the  two transected ends.  We sent the specimen off and opened it off the field  because this felt very much like a carcinoma, however, it was all  inflammatory area outside the bowel and no evidence of any mucosal lesion.  We copiously irrigated leaving the distal stump in place and then we  mobilized the proximal colon.  We made a purse-string suture at the staple  line of the proximal portion of the colon and through that, aspirated a  large amount of the feculent material out of the colon prior to the  anastomosis.  With this, about a liter of feculent bowel prep fluid was  taken out.  We chose a 29 mm Stealth EEA stapler to perform the anastomosis.  The anvil was placed with a 2-0 Prolene purse-string suture in the proximal  colon after the suture line was transected.  The assistant surgeon passed  the handle through the anus up into the area of the distal rectum.  We were  able to perform an end-to-side anastomosis as the distal portion was just  anterior to the staple line and completely avoided the staple line of the  reticulating stapler.  The anastomosis was done and we tested it for any  leak with the sigmoidoscope and also gas insufflation and none were noted.  However, it was noted upon inspecting the ring from the EEA stapler that the  proximal ring was not complete.  We went back to reinspect the anastomosis  and there was noted to be a leaking anastomosis posterolaterally on the  right side which was oversewn with about 4-5 silk sutures.  This was done  with a spring clamp in place and once this was done, the spring was left off  and there was no further leakage.  We reinforced the cecal area also with 2-  0 silk and subsequently copiously irrigated with saline and then closed.  The fascia was closed using a running #1 PDS suture.  The skin was closed with staples noting that there is a  chance of infection because of the cecal  spillage.       JOW/MEDQ  D:  06/11/2005  T:  06/11/2005  Job:  [id number]

## 2011-05-11 NOTE — Op Note (Signed)
[name], [name] NO.:  [id number]   MEDICAL RECORD NO.:  [id number]          PATIENT TYPE:  INP   LOCATION:  5704                         FACILITY:  [facility]   PHYSICIAN:  Lindaann Slough, M.D.  DATE OF BIRTH:  December 23, 1931   DATE OF PROCEDURE:  06/11/2005  DATE OF DISCHARGE:                                 OPERATIVE REPORT   PREOPERATIVE DIAGNOSIS:  Sigmoid diverticulitis.   POSTOPERATIVE DIAGNOSIS:  Sigmoid diverticulitis.   OPERATION PERFORMED:  Cystoscopy and attempted insertion of ureteral stents.   SURGEON:  Lindaann Slough, M.D.   ANESTHESIA:  General.   INDICATIONS FOR PROCEDURE:  The patient is a 77 year old female who is  scheduled for exploratory laparotomy and left colectomy for sigmoid  diverticulitis by Cherylynn Ridges, M.D. and I was asked to insert  double-J  catheter in preparation for the surgery.   DESCRIPTION OF PROCEDURE:  Under general anesthesia, the patient was prepped  and draped and placed in the dorsal lithotomy position.  A #22 Wappler  cystoscope was inserted in the bladder.  The bladder mucosa is normal.  There no stone or tumor in the bladder.  It is difficult to visualize the  ureteral orifices.  I then asked the nurse anesthetist to inject one ampule  of indigo carmine intravenously.  After 15 minutes I was still unable to see  any blue coming out of the ureteral orifices.  Several attempts were made to  catheterize what appeared to be the ureteral orifices, but I was unable to  clearly visualize the orifices.  At that point, it was decided to end the  procedure. The cystoscope was removed.  A #16 Foley catheter  was then  inserted in the bladder.  The patient was then placed in supine position for  surgery by Dr. Lindie Spruce.       MN/MEDQ  D:  06/11/2005  T:  06/12/2005  Job:  [id number]   cc:   Cherylynn Ridges, M.D.  [address]  [place]  [place] [address]

## 2011-05-11 NOTE — Discharge Summary (Signed)
NAMERIN, GORTON NO.:  [id number]   MEDICAL RECORD NO.:  [id number]                   PATIENT TYPE:  INP   LOCATION:  5730                                 FACILITY:  [facility]   PHYSICIAN:  Rene Paci, M.D. [facility]          DATE OF BIRTH:  1931/02/14   DATE OF ADMISSION:  06/21/2004  DATE OF DISCHARGE:  06/27/2004                                 DISCHARGE SUMMARY   DISCHARGE DIAGNOSES:  1. Abdominal pain.  2. Sigmoid diverticulitis.  3. Iron deficiency anemia.   BRIEF ADMISSION HISTORY:  Ms. Bushard is a 77 year old African American who  presented to the emergency department with a 49-month history of abdominal  pain.  Prior to yesterday, the pain was intermittent.  Abdominal pain was  associated with dizziness.  Because of the dizziness, the patient had been  evaluated by cardiology.  The patient had been hospitalized for a cardiac  catheterization, which was found to be negative for any obstructive disease.  She was then referred to neurology in regards to her dizziness.  The  abdominal pain had not been addressed.  She presented to the emergency  department with complaints of severe constant abdominal pain.  She stated  initially the pain was bilateral lower quadrants but has progressively  spread upwards and now the pain is under her breasts.  The patient's pain  was associated with nausea but not emesis or diarrhea.  The patient states  she does not have a history of diverticular disease.   PAST MEDICAL HISTORY:  1. Hypertension.  2. Adult-onset diabetes mellitus.  3. Osteoarthritis, status post left total knee replacement.  4. Lumbar stenosis, status post laminectomy in October 2004.  5. History of bilateral hydronephrosis, status post surgical correction 30     years ago.  6. Gastroesophageal reflux disease.  7. Gastritis and duodenitis by prior endoscopy.  8. Status post hysterectomy secondary to benign tumor.  9. Status post bilateral  bunion surgery.  10.      Glaucoma.  11.      Cardiovascular:  History of nonobstructive coronary disease by cath     in March of 2005 revealing preserved LV function.  12.      Significant pulmonary hypertension and diastolic dysfunction.   HOSPITAL COURSE:  PROBLEM #1 - GASTROINTESTINAL:  The patient presented with  abdominal pain associated with nausea.  The patient was seen in consultation  by Dr. Lina Sar.  The patient's CT of the abdomen revealed mild right  hydronephrosis, with diffuse thickening of the sigmoid colon and chronic  diverticular disease.  Acute diverticulitis or mass could not be excluded.  There was no free air or abscess.  Dr. Juanda Chance proceeded with a colonoscopy  on June 22, 2004; this did reveal sigmoid diverticulitis.  The patient was  placed on bedrest as well as IV antibiotics.  The patient was treated with  antispasmodics and pain  control.  The patient's condition did improve, her  diet was resumed and she tolerated her diet.  Followup CT of her pelvis  showed decreased inflammation around her sigmoid colon.   PROBLEM #2 - IRON DEFICIENCY ANEMIA:  The patient is hemodynamically stable  and has been started on iron.   PROBLEM #3 - ADULT-ONSET DIABETES MELLITUS:  This is well-controlled.   PROBLEM #4 - HYPERTENSION:  Systolic blood pressures ranged between the 160s  and 180s.  The patient's Cardizem was increased from 180 mg to 360 mg with  good control of her blood pressure.   PROBLEM #5 - PULMONARY HYPERTENSION AND DIASTOLIC DYSFUNCTION:  This  remained stable with no evidence of volume overload.   PROBLEM #6 - ADRENAL NODULES:  The patient had a followup CT of her abdomen  and pelvis that revealed borderline adenopathy in the chest and portal caval  regions as well as right and possibly left adrenal nodule.  Followup should  be pursued with a non-contrasted CT, which can be done as an outpatient.   LABORATORIES AT DISCHARGE:  Sed rate was 46.  BUN 5,  creatinine 0.8.  Hemoglobin 9.1.  LFTs were normal.  CA125 was 15.2 and normal.  Urinalysis  was negative.   MEDICATIONS AT DISCHARGE:  1. Cipro 500 mg b.i.d., last dose July 01, 2004.  2. Flagyl 500 mg t.i.d., last dose July 01, 2004.  3. Niferex 150 mg b.i.d.  4. Potassium 40 mEq daily.  5. Verapamil 360 mg daily.  6. Atenolol 25 mg daily.  7. Allopurinol 300 mg daily.  8. Actos 45 mg daily.  9. Avapro 300 mg daily.  10.      Torsemide 20 mg daily.  11.      Protonix 40 mg daily.  12.      Levsin 0.375 mg b.i.d.   FOLLOWUP:  Follow up with Dr. Georgina Quint. Plotnikov in 2-3 weeks.      Cornell Barman, P.A. [facility]                  Rene Paci, M.D. [facility]    LC/MEDQ  D:  06/27/2004  T:  06/27/2004  Job:  [id number]   cc:   Lina Sar, M.D. [facility]   Aleksei V. Plotnikov, M.D. [facility]

## 2011-05-11 NOTE — Assessment & Plan Note (Signed)
[facility]                         GASTROENTEROLOGY OFFICE NOTE   NAME:Breanna [name], Breanna [name]                          MRN:          [id number]  DATE:01/06/2007                            DOB:          1931-05-15    PRIMARY CARE PHYSICIAN:  Georgina Quint. Plotnikov, MD.   PRIMARY GASTROENTEROLOGIST:  Ulyess Mort, MD   INTERVAL HISTORY:  Doloris was last seen here by Dr. Victorino Dike in June  2006.  This was for severe diverticulitis with left ureteral  obstruction.  Since then, she has undergone left colon resection with  primary anastomosis by Dr. Lindie Spruce.  She tells me she had about a 1-week  stay in the hospital, and since then she actually has felt very well.  She has, however, had left lower quadrant discomfort that has been  fairly chronic since then.  This sounds like it is probably a pinched  nerve.  She has intermittent right lower quadrant pain that have been  frequent and more intense recently. She had no fever or chills, no  nausea or vomiting.  Yesterday she had a more dramatic right lower  quadrant tearing-type pain that lasted for 30 seconds.  She has been  moving her bowels fairly normal for her.  She came in today because of  this severe 30 seconds of right lower quadrant pain.   CURRENT MEDICATIONS:  Vicodin, verapamil, Celebrex, atenolol, Nexium,  torsemide, Actos, Avapro, allopurinol, Klor-Con, vitamins, cod liver  oil, Amitriptyline, Travatan.   PHYSICAL EXAMINATION:  VITAL SIGNS: Weight 208 pounds which is up 26  pounds since June 2006.  Temperature 99.0, blood pressure 130/70, pulse  68.  CONSTITUTIONAL: Obese, otherwise well appearing.  NEUROLOGIC: Alert and oriented x3.  LUNGS: Clear to auscultation bilaterally.  HEART: Regular rate and rhythm.  ABDOMEN: Soft, nontender, nondistended.  Normal bowel sounds.   ASSESSMENT AND PLAN:  A 77 year old woman with history of complicated  sigmoid diverticulitis, now with chronic left lower  quadrant discomfort,  chronic intermittent right lower quadrant discomfort, new severe right  lower quadrant pain lasting for 30 seconds.   I do not think that she has a diverticulitis given that she is afebrile  and not tender on exam.  We will, however, get a CBC and complete  metabolic group today and arrange for her to have a CT scan with IV and  oral contrast of her abdomen and pelvis.  She is not happy with her  insurance company change to Nexium and wants to get back on Protonix as  she thinks this helps her GERD symptoms a bit better, and so we are  getting her samples for now.  Pantoprazole will be  available in a generic form within the next several weeks, and we will  get a prescription of that when it is available.     Rachael Fee, MD  Electronically Signed    DPJ/MedQ  DD: 01/06/2007  DT: 01/06/2007  Job #: [id number]   cc:   Georgina Quint. Plotnikov, MD  Ulyess Mort, MD

## 2011-05-11 NOTE — Assessment & Plan Note (Signed)
[facility]                         GASTROENTEROLOGY OFFICE NOTE   NAME:Vandall, [name]                          MRN:          [id number]  DATE:01/15/2007                            DOB:          [birth year]    Breanna [name] comes in and says she is feeling better.  She says her pain has  been less frequent.  Her small bowel x-ray was negative.  Her CT scan  did not show anything.  It appears that the terminal ileum was not well-  visualized and there was a question about the terminal ileum, but her  pain basically is left lower quadrant, even more so in the tri ankle  area over ischial bone, and I really do not think it is really GI at  this time and on palpation of her abdomen I really could not elicit any  great deal of symptomatology at this time.  I reassured her that her  symptoms were okay.  I think I might have told her there might be a  hernia or some adhesions, but nothing to worry about at this time.  I  told her if she had further difficulties to please just let us know.  Her abdominal examination today was basically unremarkable as was the  remainder of her exam.  She looks actually really good for her age.  She  is worried about her husband who is in the hospital.  She requested Dr.  Juanda Chance to take care of her in my retirement and I told her that I  thought Dr. Juanda Chance would be more than happy to do so.  She has seen her  in the past.     Ulyess Mort, MD  Electronically Signed    SML/MedQ  DD: 01/15/2007  DT: 01/15/2007  Job #: [id number]

## 2011-05-11 NOTE — Discharge Summary (Signed)
[facility]  Patient:    Breanna [name], Breanna [name] Visit Number: [id number] MRN: [id number]          Service Type: [facility] Location: [id number] Attending Physician:  Faith Rogue T Dictated by:   Mcarthur Rossetti. Angiulli, P.A. Admit Date:  04/10/2002 Disc. Date: 04/15/02   CC:         Breanna [name], M.D.  Sonda Primes, M.D.   Discharge Summary  DISCHARGE DIAGNOSES: 1. Left total knee replacement secondary to degenerative joint disease. 2. Anemia. 3. Non-insulin-dependent diabetes mellitus. 4. Hypertension. 5. Gastroesophageal reflux disease. 6. Glaucoma.  PROCEDURES:  Left total knee replacement on 04/07/02.  HISTORY OF PRESENT ILLNESS:  The patient is a 77 year old black female admitted on 04/07/02, with end stage degenerative joint disease of the left knee, no relief with conservative care.  Underwent a left total knee replacement on 04/07/02, per Dr. Myrtie Neither.  Placed on Coumadin for deep vein thrombosis prophylaxis and 50% partial weightbearing.  Hospital course was uneventful.  No chest pain or shortness of breath.  Minimal assistance for ambulation as well as transfers.  Latest INR was 1.4.  Hemoglobin 10.2. Chemistries unremarkable.  Admitted for a comprehensive rehab program.  PAST MEDICAL HISTORY:  See discharge diagnoses.  PAST SURGICAL HISTORY:  Cataract surgery.  ALLERGIES:  ASPIRIN, CODEINE, DARVON, MOTRIN.  PRIMARY CARE PHYSICIAN:  Dr. Posey Rea.  HABITS:  No alcohol or tobacco.  MEDICATIONS PRIOR TO ADMISSION:  1. Lasix.  2. Zantac.  3. Potassium chloride.  4. Avandia.  5. Eye drops.  6. Zyrtec.  7. Mobic.  8. Folic acid.  9. Atenolol. 10. Avapro.  SOCIAL HISTORY:  Lives with husband in [place].  Independent prior to admission and retired.  One level home, four steps to entry.  Husband works full-time.  Family in the area works.  HOSPITAL COURSE:  The patient did well while in rehabilitation services  with therapies initiated on a b.i.d. basis.  The following issues were followed during the patients rehabilitation course.  Pertaining to [name] left total knee replacement, remained stable.  Surgical site healing nicely.  No signs of infection.  She was ambulating with a walker partial weightbearing. She would follow up with Dr. Myrtie Neither.  She was using CPM machine steadily advanced.  [facility] would continue to follow on her discharge.  She continued on Coumadin for deep venous thrombosis prophylaxis. There were no bleeding episodes.  Latest INR of 2.0.  Postoperative anemia was stable at 9.9, hematocrit 29.8.  She continued on iron supplement.  Her blood sugars remained well controlled on home doses of Avandia.  Blood pressures controlled with no orthostatic changes.  She continued on Zantac as prior to hospital admission for her gastroesophageal reflux disease.  She was also on multiple eye drops for her glaucoma.  Overall for her functional mobility, she was ambulating extended distances modified independence for her transfers and activities of daily living.  Home health therapies as noted above had been arranged.  DISCHARGE MEDICATIONS:  1. Coumadin, dose to be established at time of discharge.  She has last     received 7.5 mg on 04/13/02.  2. Tenormin 50 mg q.d.  3. Demodex 40 mg q.d.  4. Avandia 4 mg q.d.  5. Avapro 300 mg p.o. q.d.  6. Protonix 40 mg q.d.  7. Timoptic eye drops one drop both eyes b.i.d.  8. Alphagan one drop both eyes t.i.d.  9. Lotemax one drop o.s. t.i.d.  and one drop o.d. b.i.d. 10. Trinsicon b.i.d. 11. Tylox p.r.n. pain.  ACTIVITY:  Partial weightbearing 50%.  DIET:  An 1800 calorie ADA.  WOUND CARE:  Cleanse incision daily with warm water and soap.  DISCHARGE INSTRUCTIONS: 1. No driving. 2. Home health physical therapy and home health nurse per Genevieve Norlander. 3. To complete Coumadin protocol.  FOLLOWUP: 1. The patient should  follow up with Dr. Myrtie Neither for removal of staples. 2. Dr. Posey Rea for medical management. Dictated by:   Mcarthur Rossetti. Angiulli, P.A. Attending Physician:  Faith Rogue T DD:  04/14/02 TD:  04/14/02 Job: [id number]

## 2011-05-11 NOTE — Consult Note (Signed)
NAMEKURSTIN, DIMARZO NO.:  [id number]   MEDICAL RECORD NO.:  [id number]          PATIENT TYPE:  INP   LOCATION:  6703                         FACILITY:  [facility]   PHYSICIAN:  Cherylynn Ridges, M.D.    DATE OF BIRTH:  1931/04/09   DATE OF CONSULTATION:  06/06/2005  DATE OF DISCHARGE:                                   CONSULTATION   IDENTIFICATION/CHIEF COMPLAINT:  Dear Dr. Posey Rea, thank you for asking me  to see Ms. Lottes, a very pleasant 77 year old female with a history of  diverticulitis, who is being admitted because of symptoms that have  continued in spite of oral antibiotics as an outpatient.   The patient reports to me having been admitted near the end of May between  say the 26th and the 30th, for complaints of acute diverticulitis confirmed  by CT scan at that time.  She saw Dr. Claud Kelp of Surgery as an  outpatient and was scheduled for surgery on June 15, 2005, however because  her symptoms have persisted to where she can hardly eat anything, she has  continued abdominal pain rated as a 6/10, occasional chills and fevers, she  admitted her to the hospital for IV therapy and consideration for earlier  surgical treatment.  She has had no other studies done and no repeat CT scan  done.   PAST MEDICAL HISTORY:  Significant for;  1.  Adult-onset diabetes mellitus.  2.  Hypertension.  3.  History of glaucoma.  4.  Iron deficiency anemia.  5.  Questionable pulmonary hypertension noted on cardiac catheterization      March 2005.  6.  She has had history of bilateral ureteral obstruction requiring surgery      back in the 1970s.  7.  She has a history of spinal stenosis.  8.  History of gastroduodenitis.   PAST SURGICAL HISTORY:  1.  Back surgery.  2.  Left total knee arthroplasty.  3.  Cataract surgery.  4.  Renal surgery or kidney surgery for obstruction.  5.  She has had a hysterectomy.   ALLERGIES:  1.  DARVON.  2.  ASPIRIN.  3.   CODEINE.  4.  IBUPROFEN.  5.  HYDROCHLOROTHIAZIDE.   CURRENT MEDICATIONS:  As of her last hospitalization include;  1.  Verapamil 360 mg every day.  2.  Hyoscyamine 0.375 mg p.o. daily.  3.  Niferex twice daily, dosage unknown.  4.  Protonix 40 mg a day.  5.  Allopurinol 300 mg a day.  6.  Torsemide 20 mg a day.  7.  Potassium chloride 20 mEq twice a day.  8.  Actos 40 mg a day.  9.  Atenolol 25 mg daily.  10. Avapro 300 mg daily.  11. She also takes one capsule of fish oil a day.  12. Senior multivitamin a day.  13. Also, she has history of glaucoma for which she takes three different      eye drops one of which is timolol.   SOCIAL HISTORY:  She is a nondrinker, nonsmoker.  FAMILY HISTORY:  Noncontributory.   My examination of the patient today -- she is normocephalic and atraumatic  and anicteric.  She is very articulate, smart, and knows exactly what is  going on with her, she rates her pain a 6/10.  NECK:  Supple, no bruits.  CHEST:  Clear to auscultation.  CARDIAC:  Regular rate and rhythm, no murmurs, gallops, rubs or heaves.  ABDOMEN:  Soft, but tender in the left lower quadrant extending up to left  side up towards the left costal margin.  She has got present, but hypoactive  bowel sounds, and she just ate a regular meal.  She has got no rebound or  guarding.  RECTAL:  Exam was not performed.  PELVIC:  Exam was not performed.   Laboratories are done -- her white count is normal, her hemoglobin is low at  10.9, hematocrit 32.7.  Her electrolytes are within normal limits.  I have  not seen her recent CT scan, however I will review that prior to signing her  out to the Surgical Service tomorrow.   IMPRESSION:  Ongoing diverticulitis in an otherwise fairly healthy female  with a history of hypertension and diabetes.  Because of her failure to  improve with oral antibiotics, IV antibiotics are indicated, and we will try  through our service to have her colectomy done  during this hospitalization  after a bowel prep, which may take the weekend for the patient to have her  surgical intervention next week.  We will discuss with her the options over  the next 24-48 hours.  In the meantime, I do think that placing the patient  on clear liquids may assist with her symptom, and I will limit her diet.  Also she may need to have her Lovenox held prior to surgery.   PLAN:  Our plan is to go ahead with surgery at some point during this  hospitalization, but IV antibiotics are currently necessary.  Perhaps repeat  of her CT scan will be indicated also, but currently no radiologic studies  will be done.  I do not believe the patient has peritonitis significant  enough to warrant x-rays to look for free air.       JOW/MEDQ  D:  06/06/2005  T:  06/07/2005  Job:  [id number]
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
## 2011-05-11 NOTE — Discharge Summary (Signed)
NAME:  Breanna [name], Breanna [name] NO.:  [id number]   MEDICAL RECORD NO.:  [id number]                   PATIENT TYPE:  INP   LOCATION:  4737                                 FACILITY:  [facility]   PHYSICIAN:  Cecil Cranker, M.D.             DATE OF BIRTH:  Jul 11, 1931   DATE OF ADMISSION:  03/07/2004  DATE OF DISCHARGE:  03/10/2004                                 DISCHARGE SUMMARY   ADDENDUM:  It is noted in clinical reviews that there is an inhaled  medication for pulmonary arterial hypertension called Ventavis, improves the  patient's mobility and holds clinical deterioration of pulmonary arterial  hypertension, which was one of the diagnoses for which the patient was  admitted, made by [employer].  It is possible that she would also be placed on  this medication when she sees Dr. Cecil Cranker in a couple of weeks.      Maple Mirza, Anders Simmonds, M.D.    GM/MEDQ  D:  03/12/2004  T:  03/13/2004  Job:  [id number]   cc:   Cecil Cranker, M.D.

## 2011-05-11 NOTE — Op Note (Signed)
[place]  Patient:    Breanna [name], Breanna [name] Visit Number: [id number] MRN: [id number]          Service Type: DSU Location: [facility] [id number] Attending Physician:  Karenann Cai Proc. Date: 08/29/01 Admit Date:  08/28/2001 Discharge Date: 08/28/2001                             Operative Report  PREOPERATIVE DIAGNOSES: 1. Immature cataract, left eye 2. Glaucoma. 3. Diabetes mellitus.  POSTOPERATIVE DIAGNOSES: 1. Immature cataract, left eye. 2. Glaucoma. 3. Diabetes mellitus.  OPERATION:  Kelmans phacoemulsification cataract left eye with intraocular lens implantation.  SURGEON:  Marya Landry. Carlyle Lipa., M.D.  ANESTHESIA:  Local using Xylocaine 2% with Marcaine 0.75% and Wydase.  INDICATIONS:  This is a 77 year old diabetic lady with a history of glaucoma who has had a cataract extraction and glaucoma surgery in the right eye.  She now complains of blurring of vision with difficulty seeing to read.  She is evaluated and found to have a cataract of the left eye with her visual acuity best corrected at 20/30 on the right and 20/60 on the left.  Cataract extraction with intraocular lens implantation was recommended and she was admitted at this time for that purpose.  DESCRIPTION OF PROCEDURE:  Under the influence of IV sedation and Van Lint akinesia, retrobulbar anesthesia were given.  The patient was prepped and draped in the usual manner.  The lid speculum was inserted under the upper and lower lid of the left eye, and a 4-0 silk traction suture was passed through the belly of the superior rectus muscle for traction.  A fornix based conjunctival flap was turned and hemostasis achieved by using the cautery.  An incision was made in the sclera approximately 1 mm posterior to the limbus. This incision was dissected down into the clear cornea using crescent blade. A side port incision was then made at the 1:30 oclock position.  ocucoat  was injected into the eye through the side port incision.  The anterior chamber was then entered through the corneoscleral tunnel incision at the 11:30 oclock position.  An anterior capsulotomy was done using the bent 25 gauge needle and the nucleus was hydrodissected using Xylocaine.  The KPE handpiece was passed into the eye and the nucleus was emulsified without difficulty.  The residual cortical material was aspirated but during the aspiration of the cortex a large tear was obtained in the posterior capsule.  The vitreous was entered ______ and an anterior vitrectomy was done.  The posterior chamber lens was then seated into the eye behind the iris but it was felt that the lens was sticking in posterior cavity.  Therefore the procedure was halted and the corneoscleral wound was widened using the corneal scissors first toward the right and then toward the left.  Ocucoat was injected beneath the implant to keep it from sticking further into the vitreous cavity.  The anterior haptic of the lens was grasped using the Kelman-McPherson forceps and gently extracted from the eye.  An anterior chamber lens was then seated across the pupil, and seated into the chamber angle without difficulty. The corneoscleral wound was closed by using a combination of interrupted sutures of 8-0 Vicryl and 10-0 nylon.  After ascertaining that the wound was airtight and watertight the conjunctivae was closed using thermocautery.  Then 1 cc. of Celestone and 0.5 cc. of gentamicin were injected subconjunctivally.  Maxitrol ophthalmic ointment and Pilopine ointment were applied with a patch and Fox shield.  The patient tolerated the procedure well and was discharged to the postanesthesia care unit in satisfactory condition.  She is instructed to rest today and to take Vicodin every 4 hours as needed for pain and to see me in my office tomorrow for further evaluation.  DISCHARGE DIAGNOSES:  Immature cataract  left eye.Attending Physician: Karenann Cai DD:  08/29/01 TD:  08/30/01 Job: [id number]

## 2011-05-11 NOTE — Cardiovascular Report (Signed)
NAME:  Breanna, [name] NO.:  [id number]   MEDICAL RECORD NO.:  [id number]                   PATIENT TYPE:  INP   LOCATION:  4737                                 FACILITY:  [facility]   PHYSICIAN:  Carole Binning, M.D. [facility]         DATE OF BIRTH:  05/03/1931   DATE OF PROCEDURE:  03/08/2004  DATE OF DISCHARGE:                              CARDIAC CATHETERIZATION   PROCEDURE PERFORMED:  Right and left heart catheterization with coronary  angiography and left ventriculography.   INDICATION:  [name] is a 77 year old woman with a history of pulmonary  hypertension.  She presented to the emergency room yesterday with symptoms  of progressive dyspnea and chest pain.  She ruled out for myocardial  infarction.  She was referred for cardiac catheterization  to assess her  hemodynamics and coronary anatomy.   PROCEDURAL NOTE:  An 8 French sheath was placed in the right femoral vein  and a 6 French sheath in the right femoral artery.  Right heart  catheterization was performed with a Swan-Ganz catheter.  Left  ventriculography and abdominal aortography were performed with an angled  pigtail catheter.  Coronary arteriography was performed with 6 [place] JL-4  and JR-4 catheters.  Contrast was Omnipaque.  There were no complications.   CATHETERIZATION RESULTS:   HEMODYNAMICS:  1. Right atrial mean pressure 13.  2. Right ventricular pressure 56/17.  3. Pulmonary artery pressure 56/24.  4. Pulmonary capillary wedge mean pressure 26 with a V-wave of 40.  5. Left ventricular pressure 170/24.  6. Aortic pressure 170/70.  7. There is no gradient measured across the mitral valve or across the     aortic valve.  8. Cardiac output by the Fick method is 5.6.  Cardiac index is 2.8.   LEFT VENTRICULOGRAM:  Wall motion is normal.  Ejection fraction estimate at  greater than or equal to 60%.  There is trace mitral regurgitation.   Abdominal aortogram reveals normal  abdominal aorta, renal arteries and iliac  arteries.   CORONARY ARTERIOGRAPHY (RIGHT DOMINANT):  Left main has a proximal 30-40%  stenosis.   Left anterior descending artery has a 20% stenosis in the proximal vessel.  The LAD gives rise to three small diagonal branches.  The LAD is otherwise  normal.   Left circumflex has minor luminal irregularities.  It gives rise to a large  branching OM-1 and a normal size OM-2.   Right coronary artery has as tubular 20% stenosis in the proximal vessel.  The distal right coronary artery gives rise to a normal posterior descending  artery and a small posterior lateral branch.   IMPRESSION:  1. Elevated right and left heart filling pressures with significant     pulmonary hypertension.  2. Normal left ventricular systolic function.  3. No significant coronary artery disease.   In conclusion, the patient appears to have diastolic dysfunction and  associated pulmonary  venous and pulmonary arterial hypertension.   PLAN:  The patient will be managed medically.                                               Carole Binning, M.D. [facility]    MWP/MEDQ  D:  03/08/2004  T:  03/09/2004  Job:  [id number]   cc:   Georgina Quint. Plotnikov, M.D. [facility]   Bea Laura Graceann Congress, M.D.

## 2011-05-11 NOTE — Discharge Summary (Signed)
[place]. [facility]  Patient:    Breanna [name], Breanna [name] Visit Number: [id number] MRN: [id number]          Service Type: [facility] Location: 4100 [id number] Attending Physician:  Faith Rogue T Dictated by:   Kennieth Rad, M.D. Admit Date:  04/10/2002 Discharge Date: 04/15/2002                             Discharge Summary  ADMITTING DIAGNOSIS:  Degenerative joint disease, left knee.  DISCHARGE DIAGNOSIS:  Degenerative joint disease, left knee.  COMPLICATIONS:  None.  INFECTIONS:  None.  OPERATION:  Left total knee arthroplasty on April 07, 2002.  PERTINENT HISTORY:  This is a 78 year old female followed for degenerative joint disease involving the left knee.  The patients symptoms progressively worsened with persisting pain and increased immobility.  PHYSICAL EXAMINATION:  Pertinent physical was that of the left knee varus deformity, lacking full extension, crepitus medially and laterally, tender in both medial and lateral compartments, +2 effusion.  HOSPITAL COURSE:  The patient underwent preop medical evaluation and found to be stable to undergo surgery.  The patient had preop laboratory done; these were stable.  The patient underwent left total knee arthroplasty and tolerated procedure quite well.  Postop course was fairly benign.  The patient progressed with physical therapy and CPM, partial weightbearing on the left side.  The patient progressed well enough to be discharged to rehab.  The patient was discharged in stable and satisfactory condition.  FOLLOWUP:  The patient was to be seen in the office one week after discharge from rehab unit. Dictated by:   Kennieth Rad, M.D. Attending Physician:  Faith Rogue T DD:  05/13/02 TD:  05/15/02 Job: [id number]

## 2011-05-11 NOTE — Op Note (Signed)
NAMEADYN, [name]                   ACCOUNT NO.:  [id number]   MEDICAL RECORD NO.:  [id number]          PATIENT TYPE:  AMB   LOCATION:  [facility]                         FACILITY:  [facility]   PHYSICIAN:  Sigmund I. Patsi Sears, M.D.DATE OF BIRTH:  [birth year]   DATE OF PROCEDURE:  12/30/2003  DATE OF DISCHARGE:                                 OPERATIVE REPORT   PREOPERATIVE DIAGNOSIS:  Left ureteral obstruction.   POSTOPERATIVE DIAGNOSIS:  Left ureteral obstruction.   OPERATION:  Cystourethroscopy, left ureteroscopy, left retrograde pyelogram  with interpretation.   SURGEON:  Sigmund I. Patsi Sears, M.D.   ANESTHESIA:  General LMA.   PREPARATION:  After the appropriate pre-anesthesia, the patient was brought  to the operating room and placed on the operating room table in a dorsal  supine position, where general LMA anesthesia was introduced.  She was then  replaced in the dorsal lithotomy position, where the pubis was prepped with  a Betadine solution and draped in the usual fashion.   HISTORY:  This 77 year old black female recently transferred from Dr.  Claudean Severance service, found to have obstructive left ureter and newly found  left periureteral mass (?) ovary, for cystoscopy and, left retrograde  pyelogram, ureteroscopy.   PAST MEDICAL HISTORY:  Significant for abdominal pain evaluation in the  office, referred by the emergency room with CT scan showing left  hydronephrosis with dilation of the lower left ureter in the pelvis.  The  ureter was slightly more prominent than on previous examination in July, and  there appears to be a soft tissue density in the left side of the pelvis,  suspected to be external obstruction of the ureter secondary to (?) ovary.   The patient has a past history of retroperitoneal fibrosis in 1968, left  pyelonephritis in 1988, hysterectomy in the distant past.   PROCEDURE:  Cystourethroscopy, left retrograde pyelogram performed, which  showed a completely  obstructed left ureter, both on retrograde pyelogram and  on ureteroscopy.  A guidewire cannot be passed through the ureter.  There  was no identifiable ureter proximal to the intramural segment of the ureter  (approximately 8 cm of normal distal ureter).   The ureteroscope was removed, and this noted, the patient was given Lasix  and blue contrast.  The blue contrast presumably is filling the bladder from  the right side.  The patient was awakened and taken to the recovery room in  good condition.     Sigm   SIT/MEDQ  D:  12/29/2004  T:  12/29/2004  Job:  [id number]

## 2011-05-11 NOTE — Discharge Summary (Signed)
Breanna [name], Breanna [name] NO.:  [id number]   MEDICAL RECORD NO.:  [id number]          PATIENT TYPE:  INP   LOCATION:  3041                         FACILITY:  [facility]   PHYSICIAN:  Lina Sar, M.D. [facility]  DATE OF BIRTH:  12-21-1931   DATE OF ADMISSION:  05/18/2005  DATE OF DISCHARGE:  05/22/2005                                 DISCHARGE SUMMARY   ADMITTING DIAGNOSES:  1. Sigmoid diverticulitis, recurrent.  2. Diabetes mellitus type 2.  3. History of iron deficiency anemia.  4. Status post colonoscopy June 22, 2004 revealing sigmoid diverticulitis.      This test performed by Dr. Lina Sar.  5. History of left ureteral obstruction January 2005. Status post      cystoscopy, left ureteroscopy and pyelogram revealing completely      obstructed ureter performed by Dr. Patsi Sears.  6. History of remote bilateral hydronephrosis with some undetermined      surgical intervention in the 1970s.  7. Hypertension.  8. A history of spinal stenosis, status post lumbar laminectomy by Dr.      Franky Macho in October 2004.  9. History gastric duodenitis.  10.Status post hysterectomy with a history of benign tumor.  11.Status post bunion surgery, bilaterally.  12.Left adrenal nodule noted at July 2005 CT scan.  13.Borderline chest and portocaval adenopathy noted on July 2005 CT.   Status post left total knee arthroplasty in April of 2003 by Dr. Montez Morita.  14. Status post cataract surgery, the patient believes this was bilateral.  15. History of gout.     ALLERGIES:  Multiple medication allergies including ASPIRIN, CODEINE,  DARVON, IBUPROFEN, HYDROCHLOROTHIAZIDE and probably to DARVOCET N 100 as  well.   DISCHARGE DIAGNOSES:  1. Recurrent sigmoid diverticulitis, symptomatically and clinically      improved. The patient may require surgical resection of the sigmoid      colon as this is one of many bouts with sigmoid diverticulitis within      the last 10 months.  2. History of  ureteral obstruction, this was again confirmed by CT scan      performed in the outpatient setting at [facility], the      cardiology location. Urinalysis performed was negative for any active      infection urine cultures were negative.  3. Normocytic anemia for which she is taking chronic iron supplements.  4. Diabetes mellitus type 2 controlled with oral agents.     DISCHARGE DIAGNOSES CONTINUED:  1. Sigmoid diverticulitis, improved.  2. Episodic dizziness and presyncope as an outpatient not active during      this admission but may need follow up for adjustment of blood pressure      meds per her primary care doctor.  3. Chronic constipation managed with stool softeners and p.r.n. laxatives.  4. Diabetes mellitus type 2 pretty well controlled during this admission.  5. Multiple drug allergies as noted above and to this is added a new      medication Darvocet N 100 which has been causing itching lately for  this patient.     BRIEF HISTORY:  [name] is a very nice 77 year old African-American lady  who has had sigmoid diverticulitis in the past. She has had at least 4  courses of antibiotics over the last 10 months. She was hospitalized in July  of 2005 for IV antibiotic treatment of diverticulitis, and this was  following a colonoscopy confirming sigmoid diverticulitis in late June 2005.  Since then, she has had about 3 courses of outpatient antibiotics. The most  recent of which was started about 10 days prior to this admission. She had  had a CT scan March 02, 2005; again, following a course of antibiotics, that  revealed mild sigmoid diverticulitis; and then, last week, she was placed on  oral Flagyl and Cipro for both symptoms felt consistent with recurrent  diverticulitis which are pain in the lower abdomen especially in the left  lower abdomen. These symptoms are not accompanied by any nausea, vomiting,  but she does affirm that she has been anorexic. She was  not improving with  the antibiotics plus Darvocet for pain control, an outpatient CT scan was  performed which revealed sigmoid diverticulitis without abscess and a left  ureteral obstruction which is an old problem for her.   Therefore she was contacted and told to come over to the hospital for direct  admission for inpatient management specifically to allow [place] to give her IV  antibiotics. The patient also states that after using Darvocet the last few  times that she has experienced itching on her arms though no rash has broken  out and she is pretty sure that it is the Darvocet that is causing the  itching. The patient denied any fevers or rigors.  She had had about a 40  pound weight loss within the last 10-12 months. She also complained of some  urinary frequency along with stable bladder incontinence.   CONSULTATIONS:  None.   PROCEDURES:  None.   LABORATORIES:  Urinalysis was negative, essentially, except for cloudy  appearance to the urine. Urine cultures revealed no growth on final  analysis. CBC with hemoglobin of 10, hematocrit 30.5, MCV of 88.4. Platelets  of 215,000. White blood cell count of 8.2. There was a slight left shift  with increased neutrophils to 80%, but all other white cell lines were  within normal limits. Chemistries were unremarkable except for a low total  protein at 5.8 and albumin of 2.6. Her BUN was 15, creatinine 1.1, and  glucose 92. All transaminases and liver tests were within normal limits.  Imaging studies: No repeat imaging studies were performed during this  admission.   HOSPITAL COURSE:  The patient was admitted early Friday evening, May 26. She  was in stable condition, though of feeling a little bit tired and weak and  was in some discomfort. On abdominal palpation. She had tenderness in the  left lower quadrant, but no guarding or rebound and bowel sounds were active. She was not tachycardia and blood pressure was 142/79. We started  her  on IV ciprofloxacin and on IV metronidazole.  She was to allowed p.r.n.  Demerol, but later this was changed to Dilaudid and the Phenergan was  available, though she did not become nauseous. Initial diet allowed was full  liquids.   The patient was stable throughout the hospitalization. Slowly but surely,  the abdominal pain subsided though it did not completely abandon her.  It  was quite tolerable. Eventually she was converted to oral pain  control with  Vicodin; and toward the end of hospitalization, she did not require  significant amounts of this medication. She was afebrile.   At times her blood pressure did dip slightly low with the lowest reading  being 93/59. This may be important because the patient describes some  intermittent symptoms; she had been experiencing of late, where when she  stood up, she would feel a little bit dizzy and feel like she needed to sit  down. However, if she was able to stand for a while these symptoms would  pass.  It is possible, given multiple antihypertensive type medications,  that she may need some adjustment of these in the future if it is determined  that her blood pressure as the cause. However, she was seen in the emergency  room on April 19 for these complaints at which time blood pressures were  stable and blood sugar was 111. She has not raised this issue with her  primary care physician, however.   By the day of discharge which was hospital day 4+ for her, she was improved  to the point where she was comfortable without narcotics; was having normal  type bowel movements; and was clinically stable for discharge. The day prior  to discharge she had been converted over to oral agents, oral antibiotics of  Cipro and Flagyl, and she was allowed a lactose free diet which she was  tolerating.   This is one of numerous times this patient has required antibiotics for  sigmoid diverticulitis; and it is almost a picture where she has a chronic   diverticulitis which flares acutely from time to time. At this point, it may  be in her best interests to consider sigmoid resection as a therapy. The  patient was a bit reluctant to consider this, but is reasonable. Therefore,  an outpatient appointment for her to discuss surgical options will be made  for this patient. She has seen Dr. Leonie Man in the past; and we will  make an appointment for her to see him in the near future. Otherwise, she is  to follow up with Dr. Victorino Dike on Monday June 5. She also has an  appointment with Dr. Posey Rea on June 13 to discuss her episodes of  weakness and dizziness. In the meantime, the only medication adjustment that  was made was to stop her hyoscyamine as this is an anticholinergic and  probably should not be used, in this patient who has glaucoma.   MEDICATIONS AT DISCHARGE:  1. Verapamil 360 mg daily.  2. Niferex twice daily.  3. Protonix 40 mg daily. 4. Potassium chloride 20 mEq twice daily.  5. Allopurinol 300 mg daily.  6. Torsemide 20 mg daily.  7. Actos 40 mg daily.  8. Atenolol 25 mg daily.  9. Avapro 300 mg daily.  10.Fish oil capsules once daily.  11.Senior multivitamin once daily.  12.Eye drops include:  Alphagan 5 mL to the right eye twice daily, timolol      0.5% to both eyes twice daily and ocular LS to left eye twice daily.  13.Stool softeners, Colace, 1-2 times a day.  14.Milk of magnesia as needed for constipation p.r.n.  15.Ciprofloxacin 500 mg twice daily. She received a 10-day prescription      for this.  16.Metronidazole 500 mg 3x daily. She received a 10-day supply of this      medication as well.     DIET:  To be followed should be regular diabetic type  foods with resort to a  more liquid form of diet if she develops recurrent pain.      SG/MEDQ  D:  05/22/2005  T:  05/22/2005  Job:  [id number]   cc:   Leonie Man, M.D.  [address]  [address]  [place]  [place] [address]   Ulyess Mort, M.D.  [facility]

## 2011-05-11 NOTE — Op Note (Signed)
[place]  Patient:    Breanna [name], Breanna [name] Visit Number: [id number] MRN: [id number]          Service Type: [facility] Location: [id number] Attending Physician:  Faith Rogue T Dictated by:   Kennieth Rad, M.D. Proc. Date: 04/17/02 Admit Date:  04/10/2002 Discharge Date: 04/15/2002                             Operative Report  PREOPERATIVE DIAGNOSIS:  Degenerative joint disease of left knee.  POSTOPERATIVE DIAGNOSIS:  Degenerative joint disease of left knee.  PROCEDURE:  Left total knee arthroplasty, Biomet implant.  SURGEON:  Kennieth Rad, M.D.  ANESTHESIA:  General.  DESCRIPTION OF PROCEDURE:  The patient was taken to the operating room after giving adequate preop medication and given general anesthesia and intubated. Left knee was prepped with DuraPrep and draped in a sterile manner. Tourniquet and Bovie were used for hemostasis.  Anterior incision was made over the left knee, going through the skin and subcutaneous tissue.  Sharp and blunt dissection were made both medially and laterally and medial paramedian capsule incision was made from the tibial tubercle through the quadriceps, patella reflected laterally.  Knee was taken into a flexed position.  Soft tissue release was done medially due to varus deformity; this allowed the joint to be brought out into a neutral/slight valgus position.  Soft tissue resection was done.  Osteophytes about the patella and femur were done. Tibial plateau cutting guide was put in place of 10 mm.  Tibial plateau surface was resected, followed by intramedullary reaming down the femoral canal.  IM rod was put in place.  Distal femoral cutting jig was put in place at 60 degrees of valgus.  Distal femoral cut was made.  Neck sizing jig was put in place and the femur was sized at 65 mm.  Femoral cutting jig was then put in place.  Anterior and posterior cuts were made, along with chamfering cuts.  Trial  component was put in place and was found to fit very snug.  Next, attention was turned to the tibia.  Tibial sizing was that of 75 mm. Appropriate tibial cutting jig was put in place and appropriate cuts were made.  Trial components were put in place for the tibia and the femur.  Knee was brought into full extension and full flexion with good medial and lateral stability; this was best done with the 12-mm poly.  Next, attention was turned to the patella.  Patellar sizing was medium.  Appropriate cuts were made for the patella and the patellar trial was put in place.  Knee was taken through a flexion and extension.  There was no subluxation, good tracking of the patella, good medial and lateral stability of the joint, full extension, full flexion.  Next, copious irrigation was done with antibiotic solution, hemostasis obtained with the Bovie.  Methyl methacrylate was mixed.  The patella and tibial components were cemented.  Femoral component was Press-Fit. Components used were that of a medium patella, 75-mm Interlok tibial plate, 47-WG porous femoral component and a 12-mm x 71/75 poly was used.  After removal of excess methyl methacrylate and setting of the cement, again, range of motion was done and joint was found to be good and stable with good patellar tracking.  Free and copious irrigation was then done, followed by wound closure using 0 Vicryl for the fascia, 2-0 for the subcutaneous  and the skin was closed with staples.  Compressive dressing was applied along with hot ice pack was applied, knee immobilizer was applied.  Patient tolerated the procedure and went to the recovery room in stable and satisfactory condition. Dictated by:   Kennieth Rad, M.D. Attending Physician:  Faith Rogue T DD:  05/13/02 TD:  05/15/02 Job: [id number]

## 2011-05-11 NOTE — Op Note (Signed)
[name], Breanna [name]                             ACCOUNT NO.:  [id number]   MEDICAL RECORD NO.:  [id number]                   PATIENT TYPE:  INP   LOCATION:  3011                                 FACILITY:  [facility]   PHYSICIAN:  Coletta Memos, M.D.                  DATE OF BIRTH:  02/13/1931   DATE OF PROCEDURE:  10/06/2003  DATE OF DISCHARGE:                                 OPERATIVE REPORT   PREOPERATIVE DIAGNOSES:  1. Lumbar stenosis, L3 to L5.  2. Lumbar spondylolisthesis, L4-5.  3. Lumbar degenerative disk disease, L3-4, L4-5.  4. Lumbar radiculopathy.   POSTOPERATIVE DIAGNOSES:  1. Lumbar stenosis, L3 to L5.  2. Lumbar spondylolisthesis, L4-5.  3. Lumbar degenerative disk disease, L3-4, L4-5.  4. Lumbar radiculopathy.   PROCEDURES:  1. Posterior lumbar interbody fusion, L4-5.  2. Posterolateral arthrodesis, L3 to L5.  3. Pedicular screw segmental fixation, L3 to L5.  4. Interbody instrumentation, Peek 11 mm x 2 Synthes cages.  5. Morcellized allograft, morcellized autograft.  6. Laminectomy of L3 for decompression.   COMPLICATIONS:  None.   SURGEON:  Coletta Memos, M.D.   ASSISTANT:  Danae Orleans. Venetia Maxon, M.D.   ANESTHESIA:  General endotracheal.   INDICATIONS:  Breanna [name] is a 77 year old who has classic signs of  neurogenic claudication.  She has lumbar stenosis.  She has profound facet  arthropathy and profound stenosis at L3-4 and at L4-5.  I felt that I would  not be able to do an adequate decompression without fusing her at L4-5, and  I therefore offered her that.   [name] was taken to the operating room, intubated, and placed under  general anesthesia without difficulty.  She was rolled onto body rolls and  all pressure points were properly padded.  Her skin was prepped and she was  draped in a sterile fashion.  I infiltrated 30 mL of 0.5% lidocaine and  1:200,000 strength epinephrine.  I opened the skin and took this down to the  thoracolumbar fascia,  exposing the lamina of L2, L3, L4, and of L5.  I  exposed the transverse processes of L5, L4, and of L3 bilaterally.  Dr.  Venetia Maxon assisted with the exposure of the L3 transverse process.  I performed  a complete laminectomy of L4 and hemilaminectomies of L5 and laminectomies  of L3.  At the time of the laminectomy of L3, I just felt that there was  such significant facet arthropathy, which I frankly had not appreciated on  the scan at L3-4, that I would not be able to adequately decompress the  lumbar canal without impinging upon the integrity of the facet.  I therefore  made the decision to go ahead and fuse her from L3 to L5 as opposed to the  preoperative plan of only fusing her from L4 to L5.  I did not  think an  interbody arthrodesis and fusion was necessary at L3, so I went ahead and  did a facetectomy at that level and decompressed the spinal canal and the L3  nerve roots.  Both L4 nerve roots and L5 nerve roots were well-decompressed  bilaterally.  Diskectomy was done at L4-5 bilaterally.  When the disk space  was emptied, I then used the PLIF instrumentation from [employer]ynthe[employer] to remove the  end plate from the bone and prepare its surface for the Peek cage.  I then  with Dr. Fredrich Birks assistance then with fluoroscopic guidance placed the  pedicle screws at L5, L4, and at L3.  This was done bilaterally.  Each screw  hole was first identified with a ball probe.  Each hole was then tapped and  screws were placed, again with fluoroscopic guidance.  Six 40 x 6.5 mm  screws were placed.  Sixty millimeter rods were placed.  The transverse  processes were decorticated with the high-speed drill.  Bone was placed.  Morcellized autograft was then placed for the posterolateral arthrodesis.  The PLIF was then performed putting in two 11 mm cages, first on the left,  then the right side.  These were packed with autograft also.  I then placed  the rods, and they were secured.  I then packed bone into the  disk space at  L4-5.  The laminectomy at L3 was completed until I felt that there was no  compression of the thecal sac or of the nerve roots.  I then irrigated the  wound.  I then closed the wound in a layered fashion using Vicryl sutures,  reapproximated the thoracolumbar fascia, subcutaneous tissue, then the  subcuticular layer.  Dermabond used for a sterile dressing.  The patient  tolerated the procedure without difficulty.                                               Coletta Memos, M.D.    KC/MEDQ  D:  10/06/2003  T:  10/07/2003  Job:  [id number]

## 2011-05-11 NOTE — Discharge Summary (Signed)
[name], FESS NO.:  [id number]   MEDICAL RECORD NO.:  [id number]                   PATIENT TYPE:  IPS   LOCATION:  4146                                 FACILITY:  [facility]   PHYSICIAN:  Erick Colace, M.D.           DATE OF BIRTH:  May 23, 1931   DATE OF ADMISSION:  10/15/2003  DATE OF DISCHARGE:  10/22/2003                                 DISCHARGE SUMMARY   DISCHARGE DIAGNOSES:  1. Lumbar stenosis, status post lumbar laminectomy on March 05, 2003.  2. Hypertension.  3. Diabetes mellitus.  4. Proteus urinary tract infection.   HISTORY OF PRESENT ILLNESS:  Breanna [name] is a 77 year old female with history  of diabetes mellitus, low back pain with radiculopathy in lower extremities  with x-rays revealing lumbar stenosis, L3 to L4.  The patient has elected to  undergo posterior lumbar fusion at 4-5 with fixation, L3 to L5, and Synthes  cage with decompression of L3 by laminectomy on October 23, 2003 by Dr. Coletta Memos.  Postop, the patient has had some problems with back pain and  fevers this a.m. with T-max of 101.2.  PT initiated and patient noted to be  at min-assist for transfers, close supervision for ambulating 150 feet with  rolling walker, requiring increased time and decreasing endurance reported.  She is max-to-total-assist for ADLs.   PAST MEDICAL HISTORY:  Past medical history significant for:  1. Hypertension.  2. DM, type 2.  3. Glaucoma.  4. GERD.  5. Hysterectomy.  6. Varicose vein stripping, left lower extremity.  7. Bilateral knee scopes.  8. Status post left total knee replacement.  9. Bilateral foot reconstruction.  10.      Bilateral renal surgeries.  11.      Laser surgeries on eyes for bleeding.  12.      Incontinence secondary to urgency with nocturia.  13.      Bilateral ankle instability.  14.      Excision of cataracts.   ALLERGIES:  Allergies are to CODEINE, ASPIRIN, DARVON and MOTRIN.   SOCIAL HISTORY:   The patient is married, lives in a one-level home with five  steps at entry and was independent prior to admission.  She does not use any  tobacco or alcohol.   HOSPITAL COURSE:  Ms. Birdella Sippel was admitted to rehab on October 15, 2003  for inpatient therapies to consist of PT and OT daily.  Past admission, labs  done showed hemoglobin and hematocrit at 9.7 and 29.5, white count was  elevated at 4.4, platelets at 263,000.  Check of lytes showed sodium 139,  potassium 3.9, chloride 100, CO2 30, BUN 26, sodium 142, glucose 155.  She  was noted to continue with fevers and chest x-rays done showed no active  disease.  A UA/UCS was sent off and the patient grew greater than 100,000  colonies of Proteus and  the patient was treated with amoxicillin for this.  The patient's back incision was noted to be healing well without any signs  or symptoms of infection and she was able to void without difficulty.  Once  antibiotics initiated, patient defervesced and remained afebrile throughout  her stay.  Blood pressures were well-controlled.  Blood sugars were noted to  be variable, running from 150s to occasional high in low 200s.  Patient to  continue monitoring her blood sugars on a b.i.d. basis past discharge and  follow up with LMD for reevaluation and further adjustment of medications as  needed.  Given her stay in rehab, Ms. Speckman progressed to being modified  independent for ADLs for sitting position, modified independent for  toileting, able to verbalize kitchen safety at walker level.  She was  modified-independent for transfers, modified-independent for ambulating 200  feet with rolling walker, required supervision for navigating four steps.  Further followup therapies to include home health, PT and OT per [facility].  On October 22, 2003, patient is discharged to home.   DISCHARGE MEDICATIONS:  1. Elavil 25 mg nightly.  2. K-Dur 20 mEq a day.  3. Atenolol 100 mg per day.  4. Avapro  300 mg a day.  5. Avandia 4 mg b.i.d.  6. Protonix 40 mg a day.  7. Demadex 20 mg a day.  8. Ferrous sulfate 325 mg b.i.d.  9. Skelaxin 400 mg one to two four times daily p.r.n. spasm.  10.      Prednisone 5 mg b.i.d.  11.      Oxycodone IR 5 to 10 mg q.4-6 h. p.r.n. pain.  12.      Timolol, Alphagan, Lotrimax, ________ and Xalatan as at home.  13.      Ultram 50 mg one p.o. four times daily p.r.n. pain.   DIET:  No concentrated sweets.   WOUND CARE:  Keep area clean and dry.   SPECIAL INSTRUCTIONS:  No alcohol, no smoking, no driving.  Check CBGs on  b.i.d. basis.   FOLLOWUP:  Patient to follow up with Dr. Franky Macho and Dr. Georgina Quint.  Plotnikov in four to six weeks, follow up with Dr. Erick Colace as  needed.      Greg Cutter, P.A.                    Erick Colace, M.D.    PP/MEDQ  D:  11/23/2003  T:  11/23/2003  Job:  [id number]   cc:   Georgina Quint. Plotnikov, M.D. [facility]   Coletta Memos, M.D.  [address]  [place]  [place] [address]  Fax: [phone]

## 2011-06-01 ENCOUNTER — Other Ambulatory Visit: Payer: Self-pay | Admitting: Internal Medicine

## 2011-06-03 ENCOUNTER — Other Ambulatory Visit: Payer: Self-pay | Admitting: Internal Medicine

## 2011-07-02 ENCOUNTER — Other Ambulatory Visit: Payer: Self-pay | Admitting: *Deleted

## 2011-07-02 ENCOUNTER — Other Ambulatory Visit (INDEPENDENT_AMBULATORY_CARE_PROVIDER_SITE_OTHER): Payer: No Typology Code available for payment source

## 2011-07-02 DIAGNOSIS — M545 Low back pain, unspecified: Secondary | ICD-10-CM

## 2011-07-02 DIAGNOSIS — E119 Type 2 diabetes mellitus without complications: Secondary | ICD-10-CM

## 2011-07-02 DIAGNOSIS — I10 Essential (primary) hypertension: Secondary | ICD-10-CM

## 2011-07-02 LAB — BASIC METABOLIC PANEL
BUN: 18 mg/dL (ref 6–23)
CO2: 25 mEq/L (ref 19–32)
Calcium: 9.2 mg/dL (ref 8.4–10.5)
Chloride: 108 mEq/L (ref 96–112)
Creatinine, Ser: 0.9 mg/dL (ref 0.4–1.2)
GFR: 75.02 mL/min (ref >60.00)
Glucose, Bld: 114 mg/dL — ABNORMAL HIGH (ref 70–99)
Potassium: 3.6 mEq/L (ref 3.5–5.1)
Sodium: 143 mEq/L (ref 135–145)

## 2011-07-02 LAB — HEMOGLOBIN A1C: Hgb A1c MFr Bld: 7 % — ABNORMAL HIGH (ref 4.6–6.5)

## 2011-07-05 ENCOUNTER — Encounter: Payer: Self-pay | Admitting: Internal Medicine

## 2011-07-05 LAB — CULTURE, URINE COMPREHENSIVE: Colony Count: 25000

## 2011-07-06 ENCOUNTER — Ambulatory Visit (INDEPENDENT_AMBULATORY_CARE_PROVIDER_SITE_OTHER): Payer: No Typology Code available for payment source | Admitting: Internal Medicine

## 2011-07-06 ENCOUNTER — Encounter: Payer: Self-pay | Admitting: Internal Medicine

## 2011-07-06 DIAGNOSIS — M1990 Unspecified osteoarthritis, unspecified site: Secondary | ICD-10-CM

## 2011-07-06 DIAGNOSIS — E119 Type 2 diabetes mellitus without complications: Secondary | ICD-10-CM

## 2011-07-06 DIAGNOSIS — E785 Hyperlipidemia, unspecified: Secondary | ICD-10-CM

## 2011-07-06 DIAGNOSIS — I10 Essential (primary) hypertension: Secondary | ICD-10-CM

## 2011-07-06 NOTE — Assessment & Plan Note (Signed)
Cont Rx 

## 2011-07-06 NOTE — Assessment & Plan Note (Signed)
On Rx 

## 2011-07-06 NOTE — Progress Notes (Signed)
  Subjective:    Patient ID: Breanna [name], female    DOB: 10-31-1931, 77 y.o.   MRN: [id number]  HPI The patient presents for a follow-up of  chronic hypertension, chronic dyslipidemia, type 2 diabetes controlled with medicines  Wt Readings from Last 3 Encounters:  07/06/11 179 lb (81.194 kg)  04/06/11 185 lb (83.915 kg)  01/03/11 189 lb (85.73 kg)      Review of Systems  Constitutional: Positive for unexpected weight change (on diet). Negative for chills, activity change, appetite change and fatigue.  HENT: Negative for congestion, mouth sores and sinus pressure.   Eyes: Negative for visual disturbance.  Respiratory: Negative for cough and chest tightness.   Gastrointestinal: Negative for nausea and abdominal pain.  Genitourinary: Negative for frequency, difficulty urinating and vaginal pain.  Musculoskeletal: Positive for back pain (R side) and gait problem (using a cane).  Skin: Negative for pallor and rash.  Neurological: Negative for dizziness, tremors, weakness, numbness and headaches.  Psychiatric/Behavioral: Negative for confusion and sleep disturbance.       Objective:   Physical Exam  Constitutional: She appears well-developed and well-nourished. No distress.       Obese   HENT:  Head: Normocephalic.  Right Ear: External ear normal.  Left Ear: External ear normal.  Nose: Nose normal.  Mouth/Throat: Oropharynx is clear and moist.  Eyes: Conjunctivae are normal. Pupils are equal, round, and reactive to light. Right eye exhibits no discharge. Left eye exhibits no discharge.  Neck: Normal range of motion. Neck supple. No JVD present. No tracheal deviation present. No thyromegaly present.  Cardiovascular: Normal rate, regular rhythm and normal heart sounds.   Pulmonary/Chest: No stridor. No respiratory distress. She has no wheezes.  Abdominal: Soft. Bowel sounds are normal. She exhibits no distension and no mass. There is no tenderness. There is no rebound and no  guarding.  Musculoskeletal: She exhibits no edema. Tenderness: LS is tender w/ROM.       B feet hurt when squeezed  Lymphadenopathy:    She has no cervical adenopathy.  Neurological: She displays normal reflexes. No cranial nerve deficit. She exhibits normal muscle tone. Coordination (a very mild ataxia) abnormal.  Skin: No rash noted. No erythema.  Psychiatric: She has a normal mood and affect. Her behavior is normal. Judgment and thought content normal.          Assessment & Plan:

## 2011-07-09 ENCOUNTER — Telehealth: Payer: Self-pay

## 2011-07-09 NOTE — Telephone Encounter (Signed)
[facility] calling to check status of doctor order. Return call # [phone]

## 2011-07-23 ENCOUNTER — Telehealth: Payer: Self-pay | Admitting: *Deleted

## 2011-07-23 NOTE — Telephone Encounter (Signed)
Pt left vm c/o "boils" in her groin area and she is req stronger abx. Left mess to call office back for more info.

## 2011-07-25 NOTE — Telephone Encounter (Signed)
KMichele Rockers with soap and water, warm soaks and ok for refill on bactroban

## 2011-07-25 NOTE — Telephone Encounter (Signed)
Pt states she has 2 boils in her groin area approx size of a pea each. No fever or pain - says Dr Posey Rea usually gives her ointment. Ok for [employer] of generic Bactroban? - it is on her med list.

## 2011-07-26 MED ORDER — MUPIROCIN 2 % EX OINT: TOPICAL_OINTMENT | Freq: Two times a day (BID) | CUTANEOUS | Status: DC | PRN

## 2011-07-26 NOTE — Telephone Encounter (Signed)
Patient informed. 

## 2011-09-13 ENCOUNTER — Other Ambulatory Visit: Payer: Self-pay | Admitting: Internal Medicine

## 2011-09-13 DIAGNOSIS — Z1231 Encounter for screening mammogram for malignant neoplasm of breast: Secondary | ICD-10-CM

## 2011-09-15 ENCOUNTER — Other Ambulatory Visit: Payer: Self-pay | Admitting: Internal Medicine

## 2011-09-25 ENCOUNTER — Other Ambulatory Visit (INDEPENDENT_AMBULATORY_CARE_PROVIDER_SITE_OTHER): Payer: No Typology Code available for payment source

## 2011-09-25 ENCOUNTER — Ambulatory Visit (HOSPITAL_COMMUNITY)
Admission: RE | Admit: 2011-09-25 | Discharge: 2011-09-25 | Disposition: A | Discharge status: Home / Self Care | Payer: No Typology Code available for payment source | Source: Ambulatory Visit | Attending: Internal Medicine | Admitting: Internal Medicine

## 2011-09-25 DIAGNOSIS — I10 Essential (primary) hypertension: Secondary | ICD-10-CM

## 2011-09-25 DIAGNOSIS — Z1231 Encounter for screening mammogram for malignant neoplasm of breast: Secondary | ICD-10-CM | POA: Insufficient documentation

## 2011-09-25 DIAGNOSIS — E119 Type 2 diabetes mellitus without complications: Secondary | ICD-10-CM

## 2011-09-25 LAB — BASIC METABOLIC PANEL
BUN: 21 mg/dL (ref 6–23)
CO2: 29 mEq/L (ref 19–32)
Calcium: 9.1 mg/dL (ref 8.4–10.5)
Chloride: 108 mEq/L (ref 96–112)
Creatinine, Ser: 1 mg/dL (ref 0.4–1.2)
GFR: 73.15 mL/min (ref >60.00)
Glucose, Bld: 118 mg/dL — ABNORMAL HIGH (ref 70–99)
Potassium: 4 mEq/L (ref 3.5–5.1)
Sodium: 144 mEq/L (ref 135–145)

## 2011-09-28 LAB — HEMOGLOBIN A1C: Hgb A1c MFr Bld: 7.2 % — ABNORMAL HIGH (ref 4.6–6.5)

## 2011-10-01 ENCOUNTER — Ambulatory Visit (INDEPENDENT_AMBULATORY_CARE_PROVIDER_SITE_OTHER): Payer: No Typology Code available for payment source | Admitting: Internal Medicine

## 2011-10-01 ENCOUNTER — Encounter: Payer: Self-pay | Admitting: Internal Medicine

## 2011-10-01 VITALS — BP 150/80 | HR 68 | Temp 98.1°F | Resp 16 | Wt 175.0 lb

## 2011-10-01 DIAGNOSIS — E119 Type 2 diabetes mellitus without complications: Secondary | ICD-10-CM

## 2011-10-01 DIAGNOSIS — M545 Low back pain, unspecified: Secondary | ICD-10-CM

## 2011-10-01 DIAGNOSIS — E875 Hyperkalemia: Secondary | ICD-10-CM

## 2011-10-01 DIAGNOSIS — K219 Gastro-esophageal reflux disease without esophagitis: Secondary | ICD-10-CM

## 2011-10-01 DIAGNOSIS — Z23 Encounter for immunization: Secondary | ICD-10-CM

## 2011-10-01 DIAGNOSIS — I10 Essential (primary) hypertension: Secondary | ICD-10-CM

## 2011-10-01 MED ORDER — AMLODIPINE BESYLATE 5 MG PO TABS: 5.0000 mg | ORAL_TABLET | Freq: Every day | ORAL | Status: DC

## 2011-10-01 MED ORDER — PANTOPRAZOLE SODIUM 40 MG PO TBEC: 40.0000 mg | DELAYED_RELEASE_TABLET | Freq: Every day | ORAL | Status: DC

## 2011-10-01 NOTE — Progress Notes (Signed)
  Subjective:    Patient ID: Breanna [name], female    DOB: [birth year], 77 y.o.   MRN: [id number]  HPI  The patient presents for a follow-up of  chronic hypertension, chronic GERD, type 2 diabetes controlled with medicines    Review of Systems  Constitutional: Positive for fatigue. Negative for chills, activity change, appetite change and unexpected weight change.  HENT: Negative for congestion, mouth sores and sinus pressure.   Eyes: Negative for visual disturbance.  Respiratory: Negative for cough and chest tightness.   Gastrointestinal: Negative for nausea and abdominal pain.  Genitourinary: Negative for frequency, difficulty urinating and vaginal pain.  Musculoskeletal: Negative for back pain and gait problem.  Skin: Negative for pallor and rash.  Neurological: Negative for dizziness, tremors, weakness, numbness and headaches.  Psychiatric/Behavioral: Negative for confusion and sleep disturbance.   Wt Readings from Last 3 Encounters:  10/01/11 175 lb (79.379 kg)  07/06/11 179 lb (81.194 kg)  04/06/11 185 lb (83.915 kg)       Objective:   Physical Exam  Constitutional: She appears well-developed. No distress.       Obese  HENT:  Head: Normocephalic.  Right Ear: External ear normal.  Left Ear: External ear normal.  Nose: Nose normal.  Mouth/Throat: Oropharynx is clear and moist.  Eyes: Conjunctivae are normal. Pupils are equal, round, and reactive to light. Right eye exhibits no discharge. Left eye exhibits no discharge.  Neck: Normal range of motion. Neck supple. No JVD present. No tracheal deviation present. No thyromegaly present.  Cardiovascular: Normal rate, regular rhythm and normal heart sounds.   Pulmonary/Chest: No stridor. No respiratory distress. She has no wheezes.  Abdominal: Soft. Bowel sounds are normal. She exhibits no distension and no mass. There is no tenderness. There is no rebound and no guarding.  Musculoskeletal: She exhibits no edema and no tenderness.    Lymphadenopathy:    She has no cervical adenopathy.  Neurological: She displays normal reflexes. No cranial nerve deficit. She exhibits normal muscle tone. Coordination normal.  Skin: No rash noted. No erythema.  Psychiatric: She has a normal mood and affect. Her behavior is normal. Thought content normal.    Lab Results  Component Value Date   WBC 4.7 04/03/2011   HGB 11.3* 04/03/2011   HCT 34.6* 04/03/2011   PLT 169.0 04/03/2011   GLUCOSE 118* 09/25/2011   CHOL 148 04/03/2011   TRIG 86.0 04/03/2011   HDL 47.30 04/03/2011   LDLCALC 84 04/03/2011   ALT 13 04/03/2011   AST 16 04/03/2011   NA 144 09/25/2011   K 4.0 09/25/2011   CL 108 09/25/2011   CREATININE 1.0 09/25/2011   BUN 21 09/25/2011   CO2 29 09/25/2011   TSH 0.88 04/03/2011   HGBA1C 7.2* 09/25/2011         Assessment & Plan:

## 2011-10-01 NOTE — Assessment & Plan Note (Signed)
Continue with current prescription therapy as reflected on the Med list.  

## 2011-10-01 NOTE — Assessment & Plan Note (Signed)
Continue with current prescription therapy as reflected on the Med list. prn 

## 2011-10-01 NOTE — Assessment & Plan Note (Signed)
Resolved

## 2011-10-01 NOTE — Assessment & Plan Note (Signed)
BP Readings from Last 3 Encounters:  10/01/11 150/80  07/06/11 122/80  04/06/11 130/80  BP is ok at home

## 2011-10-04 ENCOUNTER — Ambulatory Visit (INDEPENDENT_AMBULATORY_CARE_PROVIDER_SITE_OTHER): Payer: No Typology Code available for payment source | Admitting: *Deleted

## 2011-10-04 DIAGNOSIS — Z23 Encounter for immunization: Secondary | ICD-10-CM

## 2011-10-04 DIAGNOSIS — Z2911 Encounter for prophylactic immunotherapy for respiratory syncytial virus (RSV): Secondary | ICD-10-CM

## 2011-10-09 LAB — CBC
HCT: 35.6 — ABNORMAL LOW
Hemoglobin: 11.6 — ABNORMAL LOW
MCHC: 32.5
MCV: 90.3
Platelets: 166
RBC: 3.94
RDW: 17 — ABNORMAL HIGH
WBC: 5.9

## 2011-10-09 LAB — URINALYSIS, ROUTINE W REFLEX MICROSCOPIC
Bilirubin Urine: NEGATIVE
Glucose, UA: NEGATIVE
Hgb urine dipstick: NEGATIVE
Ketones, ur: NEGATIVE
Nitrite: NEGATIVE
Protein, ur: 30 — AB
Specific Gravity, Urine: 1.019
Urobilinogen, UA: 0.2
pH: 8

## 2011-10-09 LAB — URINE MICROSCOPIC-ADD ON

## 2011-10-09 LAB — COMPREHENSIVE METABOLIC PANEL
ALT: 18
AST: 22
Albumin: 3.2 — ABNORMAL LOW
Alkaline Phosphatase: 128 — ABNORMAL HIGH
BUN: 19
CO2: 27
Calcium: 9
Chloride: 107
Creatinine, Ser: 0.78
GFR calc Af Amer: >60
GFR calc non Af Amer: >60
Glucose, Bld: 95
Potassium: 3.7
Sodium: 141
Total Bilirubin: 0.5
Total Protein: 6.8

## 2011-11-08 ENCOUNTER — Telehealth: Payer: Self-pay

## 2011-11-08 NOTE — Telephone Encounter (Signed)
[facility] Triage Call Report Triage Record Num: [id number] Operator: Hillary Bow Patient Name: Breanna [name] Call Date & Time: 11/07/2011 5:39:53PM Patient Phone: [phone] PCP: Sonda Primes Patient Gender: Female PCP Fax : [phone] Patient DOB: 1931/09/17 Practice Name: [facility] Reason for Call: Caller: Verleen/Patient; PCP: Sonda Primes; CB#: [phone]; Call Reason: Mdications Q.; Sx Pt following up w/ Jasmine December, Pt was advised from [facility] to have Office call [phone] and ask for a Coverage review, to authroize [employer], new RX will be needed. PLEASE F/U W/ PT AT [phone]. Protocol(s) Used: Office Note Recommended Outcome per Protocol: Information Noted and Sent to Office Reason for Outcome: Caller information to office Care Advice: ~ 11/07/2011 5:45:00PM Page 1 of 1 [email or web address]

## 2011-11-08 NOTE — Telephone Encounter (Signed)
Informed patient to contact pharmacy if she believes a PA is needed through her insurance

## 2011-11-12 ENCOUNTER — Telehealth: Payer: Self-pay

## 2011-11-12 NOTE — Telephone Encounter (Signed)
Called insurance and gave clinical information//medication approved 10/22/11-11/11/12, pt notified

## 2011-11-12 NOTE — Telephone Encounter (Signed)
Patient called stating that per insurance, they will not cover pantoprazole w/o a prior auth. Need to call [phone] to obtain PA.

## 2011-12-27 ENCOUNTER — Other Ambulatory Visit (INDEPENDENT_AMBULATORY_CARE_PROVIDER_SITE_OTHER): Payer: No Typology Code available for payment source

## 2011-12-27 DIAGNOSIS — E875 Hyperkalemia: Secondary | ICD-10-CM

## 2011-12-27 DIAGNOSIS — M545 Low back pain, unspecified: Secondary | ICD-10-CM

## 2011-12-27 DIAGNOSIS — I10 Essential (primary) hypertension: Secondary | ICD-10-CM

## 2011-12-27 DIAGNOSIS — K219 Gastro-esophageal reflux disease without esophagitis: Secondary | ICD-10-CM

## 2011-12-27 DIAGNOSIS — E119 Type 2 diabetes mellitus without complications: Secondary | ICD-10-CM

## 2011-12-27 LAB — COMPREHENSIVE METABOLIC PANEL
ALT: 13 U/L (ref 0–35)
AST: 16 U/L (ref 0–37)
Albumin: 3.5 g/dL (ref 3.5–5.2)
Alkaline Phosphatase: 103 U/L (ref 39–117)
BUN: 25 mg/dL — ABNORMAL HIGH (ref 6–23)
CO2: 29 mEq/L (ref 19–32)
Calcium: 9.5 mg/dL (ref 8.4–10.5)
Chloride: 110 mEq/L (ref 96–112)
Creatinine, Ser: 1 mg/dL (ref 0.4–1.2)
GFR: 66.59 mL/min (ref >60.00)
Glucose, Bld: 127 mg/dL — ABNORMAL HIGH (ref 70–99)
Potassium: 5.2 mEq/L — ABNORMAL HIGH (ref 3.5–5.1)
Sodium: 146 mEq/L — ABNORMAL HIGH (ref 135–145)
Total Bilirubin: 0.4 mg/dL (ref 0.3–1.2)
Total Protein: 6.8 g/dL (ref 6.0–8.3)

## 2011-12-27 LAB — CBC WITH DIFFERENTIAL/PLATELET
Basophils Absolute: 0 10*3/uL (ref 0.0–0.1)
Basophils Relative: 0.4 % (ref 0.0–3.0)
Eosinophils Absolute: 0.2 10*3/uL (ref 0.0–0.7)
Eosinophils Relative: 3.7 % (ref 0.0–5.0)
HCT: 35.1 % — ABNORMAL LOW (ref 36.0–46.0)
Hemoglobin: 11.3 g/dL — ABNORMAL LOW (ref 12.0–15.0)
Lymphocytes Relative: 25.5 % (ref 12.0–46.0)
Lymphs Abs: 1.3 10*3/uL (ref 0.7–4.0)
MCHC: 32.1 g/dL (ref 30.0–36.0)
MCV: 88.8 fl (ref 78.0–100.0)
Monocytes Absolute: 0.6 10*3/uL (ref 0.1–1.0)
Monocytes Relative: 11 % (ref 3.0–12.0)
Neutro Abs: 3.1 10*3/uL (ref 1.4–7.7)
Neutrophils Relative %: 59.4 % (ref 43.0–77.0)
Platelets: 173 10*3/uL (ref 150.0–400.0)
RBC: 3.95 Mil/uL (ref 3.87–5.11)
RDW: 17.4 % — ABNORMAL HIGH (ref 11.5–14.6)
WBC: 5.2 10*3/uL (ref 4.5–10.5)

## 2011-12-27 LAB — TSH: TSH: 0.55 u[IU]/mL (ref 0.35–5.50)

## 2011-12-27 LAB — HEMOGLOBIN A1C: Hgb A1c MFr Bld: 6.7 % — ABNORMAL HIGH (ref 4.6–6.5)

## 2011-12-31 ENCOUNTER — Encounter: Payer: Self-pay | Admitting: Internal Medicine

## 2011-12-31 ENCOUNTER — Ambulatory Visit (INDEPENDENT_AMBULATORY_CARE_PROVIDER_SITE_OTHER): Payer: No Typology Code available for payment source | Admitting: Internal Medicine

## 2011-12-31 VITALS — BP 130/66 | HR 64 | Temp 98.0°F | Resp 16 | Wt 171.0 lb

## 2011-12-31 DIAGNOSIS — E119 Type 2 diabetes mellitus without complications: Secondary | ICD-10-CM

## 2011-12-31 DIAGNOSIS — I10 Essential (primary) hypertension: Secondary | ICD-10-CM

## 2011-12-31 MED ORDER — TRAMADOL HCL 50 MG PO TABS: 25.0000 mg | ORAL_TABLET | Freq: Two times a day (BID) | ORAL | Status: DC | PRN

## 2011-12-31 MED ORDER — SERTRALINE HCL 50 MG PO TABS: 25.0000 mg | ORAL_TABLET | Freq: Every day | ORAL | Status: DC

## 2011-12-31 MED ORDER — TRAMADOL HCL 50 MG PO TABS: 50.0000 mg | ORAL_TABLET | Freq: Two times a day (BID) | ORAL | Status: DC | PRN

## 2011-12-31 MED ORDER — IBUPROFEN 600 MG PO TABS: 600.0000 mg | ORAL_TABLET | Freq: Two times a day (BID) | ORAL | Status: DC | PRN

## 2011-12-31 NOTE — Assessment & Plan Note (Signed)
Continue with current prescription therapy as reflected on the Med list.  

## 2011-12-31 NOTE — Progress Notes (Signed)
  Subjective:    Patient ID: Breanna [name], female    DOB: 1931-02-13, 78 y.o.   MRN: [id number]  HPI  The patient presents for a follow-up of  chronic hypertension, chronic dyslipidemia, type 2 diabetes controlled with medicines C/o knee pain and LBP, OA    Review of Systems  Constitutional: Negative for chills, activity change, appetite change, fatigue and unexpected weight change.  HENT: Negative for congestion, mouth sores and sinus pressure.   Eyes: Negative for visual disturbance.  Respiratory: Negative for cough and chest tightness.   Gastrointestinal: Negative for nausea and abdominal pain.  Genitourinary: Negative for frequency, difficulty urinating and vaginal pain.  Musculoskeletal: Positive for back pain, arthralgias and gait problem.  Skin: Negative for pallor and rash.  Neurological: Negative for dizziness, tremors, weakness, numbness and headaches.  Psychiatric/Behavioral: Negative for suicidal ideas, confusion and sleep disturbance. The patient is nervous/anxious.        Objective:   Physical Exam  Constitutional: She appears well-developed. No distress.  HENT:  Head: Normocephalic.  Right Ear: External ear normal.  Left Ear: External ear normal.  Nose: Nose normal.  Mouth/Throat: Oropharynx is clear and moist.  Eyes: Conjunctivae are normal. Pupils are equal, round, and reactive to light. Right eye exhibits no discharge. Left eye exhibits no discharge.  Neck: Normal range of motion. Neck supple. No JVD present. No tracheal deviation present. No thyromegaly present.  Cardiovascular: Normal rate, regular rhythm and normal heart sounds.   Pulmonary/Chest: No stridor. No respiratory distress. She has no wheezes.  Abdominal: Soft. Bowel sounds are normal. She exhibits no distension and no mass. There is no tenderness. There is no rebound and no guarding.  Musculoskeletal: She exhibits tenderness (LS spine is tender w/ROM). She exhibits no edema.  Lymphadenopathy:   She has no cervical adenopathy.  Neurological: She displays normal reflexes. No cranial nerve deficit. She exhibits normal muscle tone. Coordination normal.  Skin: No rash noted. No erythema.  Psychiatric: She has a normal mood and affect. Her behavior is normal. Judgment and thought content normal.          Assessment & Plan:

## 2012-01-14 ENCOUNTER — Telehealth: Payer: Self-pay

## 2012-01-14 MED ORDER — OSELTAMIVIR PHOSPHATE 75 MG PO CAPS: 75.0000 mg | ORAL_CAPSULE | Freq: Two times a day (BID) | ORAL | Status: AC

## 2012-01-14 NOTE — Telephone Encounter (Signed)
Please advise 

## 2012-01-14 NOTE — Telephone Encounter (Signed)
Called patient - starting Friday morning night sweats and chills. Has not documented temperature. Myalgias and weakness, anorexia. She has been in the bed.  Plan - tamiflu 75 mg bid x 5  For hsuband tamiflu 75mg  qd x 10

## 2012-01-14 NOTE — Telephone Encounter (Signed)
Patients husband called to request something called in for the flu for the patient. Please advise

## 2012-03-03 ENCOUNTER — Other Ambulatory Visit: Payer: Self-pay | Admitting: Internal Medicine

## 2012-03-12 ENCOUNTER — Other Ambulatory Visit: Payer: Self-pay | Admitting: Internal Medicine

## 2012-04-08 ENCOUNTER — Other Ambulatory Visit (INDEPENDENT_AMBULATORY_CARE_PROVIDER_SITE_OTHER): Payer: No Typology Code available for payment source

## 2012-04-08 DIAGNOSIS — I10 Essential (primary) hypertension: Secondary | ICD-10-CM

## 2012-04-08 DIAGNOSIS — E119 Type 2 diabetes mellitus without complications: Secondary | ICD-10-CM

## 2012-04-08 LAB — BASIC METABOLIC PANEL
BUN: 20 mg/dL (ref 6–23)
CO2: 28 mEq/L (ref 19–32)
Calcium: 9.1 mg/dL (ref 8.4–10.5)
Chloride: 108 mEq/L (ref 96–112)
Creatinine, Ser: 0.9 mg/dL (ref 0.4–1.2)
GFR: 79.8 mL/min (ref >60.00)
Glucose, Bld: 122 mg/dL — ABNORMAL HIGH (ref 70–99)
Potassium: 3.8 mEq/L (ref 3.5–5.1)
Sodium: 144 mEq/L (ref 135–145)

## 2012-04-08 LAB — HEMOGLOBIN A1C: Hgb A1c MFr Bld: 6.7 % — ABNORMAL HIGH (ref 4.6–6.5)

## 2012-04-10 ENCOUNTER — Encounter: Payer: Self-pay | Admitting: Internal Medicine

## 2012-04-10 ENCOUNTER — Ambulatory Visit (INDEPENDENT_AMBULATORY_CARE_PROVIDER_SITE_OTHER): Payer: No Typology Code available for payment source | Admitting: Internal Medicine

## 2012-04-10 VITALS — BP 150/82 | HR 72 | Temp 98.2°F | Resp 16 | Wt 165.0 lb

## 2012-04-10 DIAGNOSIS — E785 Hyperlipidemia, unspecified: Secondary | ICD-10-CM

## 2012-04-10 DIAGNOSIS — M545 Low back pain, unspecified: Secondary | ICD-10-CM

## 2012-04-10 DIAGNOSIS — I10 Essential (primary) hypertension: Secondary | ICD-10-CM

## 2012-04-10 DIAGNOSIS — E119 Type 2 diabetes mellitus without complications: Secondary | ICD-10-CM

## 2012-04-10 DIAGNOSIS — R634 Abnormal weight loss: Secondary | ICD-10-CM | POA: Insufficient documentation

## 2012-04-10 DIAGNOSIS — M1990 Unspecified osteoarthritis, unspecified site: Secondary | ICD-10-CM

## 2012-04-10 DIAGNOSIS — M109 Gout, unspecified: Secondary | ICD-10-CM

## 2012-04-10 NOTE — Progress Notes (Signed)
Patient ID: Breanna [name], female   DOB: [birth year], 78 y.o.   MRN: [id number]  Subjective:    Patient ID: Breanna [name], female    DOB: [birth year], 78 y.o.   MRN: [id number]  HPI  The patient presents for a follow-up of  chronic hypertension, chronic dyslipidemia, type 2 diabetes controlled with medicines C/o knee pain and LBP, OA  Wt Readings from Last 3 Encounters:  04/10/12 165 lb (74.844 kg)  12/31/11 171 lb (77.565 kg)  10/01/11 175 lb (79.379 kg)   BP Readings from Last 3 Encounters:  04/10/12 150/82  12/31/11 130/66  10/01/11 150/80      Review of Systems  Constitutional: Negative for chills, activity change, appetite change, fatigue and unexpected weight change.  HENT: Negative for congestion, mouth sores and sinus pressure.   Eyes: Negative for visual disturbance.  Respiratory: Negative for cough and chest tightness.   Gastrointestinal: Negative for nausea and abdominal pain.  Genitourinary: Negative for frequency, difficulty urinating and vaginal pain.  Musculoskeletal: Positive for back pain, arthralgias and gait problem.  Skin: Negative for pallor and rash.  Neurological: Negative for dizziness, tremors, weakness, numbness and headaches.  Psychiatric/Behavioral: Negative for suicidal ideas, confusion and sleep disturbance. The patient is nervous/anxious.        Objective:   Physical Exam  Constitutional: She appears well-developed. No distress.  HENT:  Head: Normocephalic.  Right Ear: External ear normal.  Left Ear: External ear normal.  Nose: Nose normal.  Mouth/Throat: Oropharynx is clear and moist.  Eyes: Conjunctivae are normal. Pupils are equal, round, and reactive to light. Right eye exhibits no discharge. Left eye exhibits no discharge.  Neck: Normal range of motion. Neck supple. No JVD present. No tracheal deviation present. No thyromegaly present.  Cardiovascular: Normal rate, regular rhythm and normal heart sounds.   Pulmonary/Chest: No stridor. No  respiratory distress. She has no wheezes.  Abdominal: Soft. Bowel sounds are normal. She exhibits no distension and no mass. There is no tenderness. There is no rebound and no guarding.  Musculoskeletal: She exhibits tenderness (LS spine is tender w/ROM). She exhibits no edema.  Lymphadenopathy:    She has no cervical adenopathy.  Neurological: She displays normal reflexes. No cranial nerve deficit. She exhibits normal muscle tone. Coordination normal.  Skin: No rash noted. No erythema.  Psychiatric: She has a normal mood and affect. Her behavior is normal. Judgment and thought content normal.   Lab Results  Component Value Date   WBC 5.2 12/27/2011   HGB 11.3* 12/27/2011   HCT 35.1* 12/27/2011   PLT 173.0 12/27/2011   GLUCOSE 122* 04/08/2012   CHOL 148 04/03/2011   TRIG 86.0 04/03/2011   HDL 47.30 04/03/2011   LDLCALC 84 04/03/2011   ALT 13 12/27/2011   AST 16 12/27/2011   NA 144 04/08/2012   K 3.8 04/08/2012   CL 108 04/08/2012   CREATININE 0.9 04/08/2012   BUN 20 04/08/2012   CO2 28 04/08/2012   TSH 0.55 12/27/2011   HGBA1C 6.7* 04/08/2012           Assessment & Plan:

## 2012-04-10 NOTE — Assessment & Plan Note (Signed)
More labs, Xrays (CXR) if persists

## 2012-04-10 NOTE — Assessment & Plan Note (Signed)
Continue with current prescription therapy as reflected on the Med list.  

## 2012-04-26 ENCOUNTER — Other Ambulatory Visit: Payer: Self-pay | Admitting: Internal Medicine

## 2012-06-19 DIAGNOSIS — H35359 Cystoid macular degeneration, unspecified eye: Secondary | ICD-10-CM | POA: Insufficient documentation

## 2012-06-19 DIAGNOSIS — Z947 Corneal transplant status: Secondary | ICD-10-CM | POA: Insufficient documentation

## 2012-06-25 ENCOUNTER — Other Ambulatory Visit: Payer: Self-pay

## 2012-06-25 MED ORDER — CARVEDILOL 25 MG PO TABS: 25.0000 mg | ORAL_TABLET | Freq: Two times a day (BID) | ORAL | Status: DC

## 2012-06-27 ENCOUNTER — Other Ambulatory Visit: Payer: Self-pay | Admitting: Internal Medicine

## 2012-07-10 ENCOUNTER — Other Ambulatory Visit (INDEPENDENT_AMBULATORY_CARE_PROVIDER_SITE_OTHER): Payer: No Typology Code available for payment source

## 2012-07-10 DIAGNOSIS — E785 Hyperlipidemia, unspecified: Secondary | ICD-10-CM

## 2012-07-10 DIAGNOSIS — I10 Essential (primary) hypertension: Secondary | ICD-10-CM

## 2012-07-10 DIAGNOSIS — M545 Low back pain, unspecified: Secondary | ICD-10-CM

## 2012-07-10 DIAGNOSIS — M109 Gout, unspecified: Secondary | ICD-10-CM

## 2012-07-10 DIAGNOSIS — M1990 Unspecified osteoarthritis, unspecified site: Secondary | ICD-10-CM

## 2012-07-10 DIAGNOSIS — E119 Type 2 diabetes mellitus without complications: Secondary | ICD-10-CM

## 2012-07-10 LAB — BASIC METABOLIC PANEL
BUN: 25 mg/dL — ABNORMAL HIGH (ref 6–23)
CO2: 28 mEq/L (ref 19–32)
Calcium: 9.1 mg/dL (ref 8.4–10.5)
Chloride: 108 mEq/L (ref 96–112)
Creatinine, Ser: 1.1 mg/dL (ref 0.4–1.2)
GFR: 60.37 mL/min (ref >60.00)
Glucose, Bld: 109 mg/dL — ABNORMAL HIGH (ref 70–99)
Potassium: 4.2 mEq/L (ref 3.5–5.1)
Sodium: 143 mEq/L (ref 135–145)

## 2012-07-10 LAB — HEMOGLOBIN A1C: Hgb A1c MFr Bld: 6.7 % — ABNORMAL HIGH (ref 4.6–6.5)

## 2012-07-10 LAB — TSH: TSH: 0.53 u[IU]/mL (ref 0.35–5.50)

## 2012-07-10 LAB — URIC ACID: Uric Acid, Serum: 3 mg/dL (ref 2.4–7.0)

## 2012-07-17 ENCOUNTER — Encounter: Payer: Self-pay | Admitting: Internal Medicine

## 2012-07-17 ENCOUNTER — Ambulatory Visit (INDEPENDENT_AMBULATORY_CARE_PROVIDER_SITE_OTHER): Payer: No Typology Code available for payment source | Admitting: Internal Medicine

## 2012-07-17 VITALS — BP 150/80 | HR 68 | Temp 97.8°F | Resp 16 | Wt 166.0 lb

## 2012-07-17 DIAGNOSIS — M25539 Pain in unspecified wrist: Secondary | ICD-10-CM

## 2012-07-17 DIAGNOSIS — M25531 Pain in right wrist: Secondary | ICD-10-CM

## 2012-07-17 DIAGNOSIS — M545 Low back pain, unspecified: Secondary | ICD-10-CM

## 2012-07-17 DIAGNOSIS — M109 Gout, unspecified: Secondary | ICD-10-CM

## 2012-07-17 DIAGNOSIS — H6120 Impacted cerumen, unspecified ear: Secondary | ICD-10-CM

## 2012-07-17 DIAGNOSIS — E119 Type 2 diabetes mellitus without complications: Secondary | ICD-10-CM

## 2012-07-17 MED ORDER — TRIAMCINOLONE ACETONIDE 0.5 % EX CREA: TOPICAL_CREAM | Freq: Two times a day (BID) | CUTANEOUS | Status: DC | PRN

## 2012-07-17 NOTE — Assessment & Plan Note (Signed)
2013 R abd poll longus tendonitis Pensaid Will inject in 2 wks if needed

## 2012-07-17 NOTE — Patient Instructions (Signed)
Pensaid 1- drops 3-4 times a day

## 2012-07-17 NOTE — Assessment & Plan Note (Signed)
Continue with current prescription therapy as reflected on the Med list.  

## 2012-07-17 NOTE — Progress Notes (Signed)
Subjective:    Patient ID: Breanna [name], female    DOB: 09-22-1931, 78 y.o.   MRN: [id number]  Ear Fullness  There is pain in the right ear. This is a new problem. The current episode started 1 to 4 weeks ago. The problem occurs every few hours. The problem has been unchanged. There has been no fever. The pain is mild. Pertinent negatives include no abdominal pain, coughing, headaches or rash.  Fall The accident occurred more than 1 week ago (6/13). The fall occurred while walking. She landed on concrete. The point of impact was the right wrist and right foot. The pain is moderate. Pertinent negatives include no abdominal pain, headaches or nausea.    The patient presents for a follow-up of  chronic hypertension, chronic dyslipidemia, type 2 diabetes controlled with medicines C/o knee pain and LBP, OA.  She has been doing water aerobics  Wt Readings from Last 3 Encounters:  07/17/12 166 lb (75.297 kg)  04/10/12 165 lb (74.844 kg)  12/31/11 171 lb (77.565 kg)   BP Readings from Last 3 Encounters:  07/17/12 150/80  04/10/12 150/82  12/31/11 130/66      Review of Systems  Constitutional: Negative for chills, activity change, appetite change, fatigue and unexpected weight change.  HENT: Negative for congestion, mouth sores and sinus pressure.   Eyes: Negative for visual disturbance.  Respiratory: Negative for cough and chest tightness.   Gastrointestinal: Negative for nausea and abdominal pain.  Genitourinary: Negative for frequency, difficulty urinating and vaginal pain.  Musculoskeletal: Positive for back pain, arthralgias and gait problem.  Skin: Negative for pallor and rash.  Neurological: Negative for dizziness, tremors, weakness and headaches.  Psychiatric/Behavioral: Negative for suicidal ideas, confusion and disturbed wake/sleep cycle. The patient is nervous/anxious.        Objective:   Physical Exam  Constitutional: She appears well-developed. No distress.  HENT:    Head: Normocephalic.  Left Ear: External ear normal.  Nose: Nose normal.  Mouth/Throat: Oropharynx is clear and moist.  Eyes: Conjunctivae are normal. Pupils are equal, round, and reactive to light. Right eye exhibits no discharge. Left eye exhibits no discharge.  Neck: Normal range of motion. Neck supple. No JVD present. No tracheal deviation present. No thyromegaly present.  Cardiovascular: Normal rate, regular rhythm and normal heart sounds.   Pulmonary/Chest: No stridor. No respiratory distress. She has no wheezes.  Abdominal: Soft. Bowel sounds are normal. She exhibits no distension and no mass. There is no tenderness. There is no rebound and no guarding.  Musculoskeletal: She exhibits tenderness (LS spine is tender w/ROM). She exhibits no edema.       R wrist and R ankle are tender  Lymphadenopathy:    She has no cervical adenopathy.  Neurological: She displays normal reflexes. No cranial nerve deficit. She exhibits normal muscle tone. Coordination normal.  Skin: No rash noted. No erythema.  Psychiatric: She has a normal mood and affect. Her behavior is normal. Judgment and thought content normal.  Wax filled R ear R thumb base is tender R achilles' tendon is tender  Lab Results  Component Value Date   WBC 5.2 12/27/2011   HGB 11.3* 12/27/2011   HCT 35.1* 12/27/2011   PLT 173.0 12/27/2011   GLUCOSE 109* 07/10/2012   CHOL 148 04/03/2011   TRIG 86.0 04/03/2011   HDL 47.30 04/03/2011   LDLCALC 84 04/03/2011   ALT 13 12/27/2011   AST 16 12/27/2011   NA 143 07/10/2012   K 4.2 07/10/2012  CL 108 07/10/2012   CREATININE 1.1 07/10/2012   BUN 25* 07/10/2012   CO2 28 07/10/2012   TSH 0.53 07/10/2012   HGBA1C 6.7* 07/10/2012    Procedure Note :     Procedure :  Ear irrigation   Indication:  Cerumen impaction R   Risks, including pain, dizziness, eardrum perforation, bleeding, infection and others as well as benefits were explained to the patient in detail. Verbal consent was obtained and the  patient agreed to proceed.    We used "The [employer] Device" field with lukewarm water for irrigation. A large amount wax was recovered. Procedure has also required manual wax removal with an ear loop.   Tolerated well. Complications: None.   Postprocedure instructions :  Call if problems.          Assessment & Plan:

## 2012-07-17 NOTE — Assessment & Plan Note (Signed)
Will irrigate 

## 2012-07-30 ENCOUNTER — Encounter: Payer: Self-pay | Admitting: Internal Medicine

## 2012-07-30 ENCOUNTER — Ambulatory Visit (INDEPENDENT_AMBULATORY_CARE_PROVIDER_SITE_OTHER): Payer: No Typology Code available for payment source | Admitting: Internal Medicine

## 2012-07-30 VITALS — BP 140/80 | HR 60 | Temp 97.6°F | Resp 16 | Wt 165.0 lb

## 2012-07-30 DIAGNOSIS — M545 Low back pain, unspecified: Secondary | ICD-10-CM

## 2012-07-30 DIAGNOSIS — M542 Cervicalgia: Secondary | ICD-10-CM

## 2012-07-30 DIAGNOSIS — M109 Gout, unspecified: Secondary | ICD-10-CM

## 2012-07-30 DIAGNOSIS — M25531 Pain in right wrist: Secondary | ICD-10-CM

## 2012-07-30 DIAGNOSIS — M25539 Pain in unspecified wrist: Secondary | ICD-10-CM

## 2012-07-30 DIAGNOSIS — E1142 Type 2 diabetes mellitus with diabetic polyneuropathy: Secondary | ICD-10-CM

## 2012-07-30 DIAGNOSIS — E1140 Type 2 diabetes mellitus with diabetic neuropathy, unspecified: Secondary | ICD-10-CM | POA: Insufficient documentation

## 2012-07-30 DIAGNOSIS — I10 Essential (primary) hypertension: Secondary | ICD-10-CM

## 2012-07-30 DIAGNOSIS — E1149 Type 2 diabetes mellitus with other diabetic neurological complication: Secondary | ICD-10-CM

## 2012-07-30 MED ORDER — GABAPENTIN 100 MG PO CAPS: 100.0000 mg | ORAL_CAPSULE | Freq: Three times a day (TID) | ORAL | Status: DC | PRN

## 2012-07-30 NOTE — Progress Notes (Signed)
  Subjective:    Patient ID: Breanna [name], female    DOB: 14-Feb-1931, 78 y.o.   MRN: [id number]  HPI  The patient presents for a follow-up of  chronic hypertension, chronic dyslipidemia, type 2 diabetes controlled with medicines F/u on R knee pain and LBP, OA.  She has been doing water aerobics C/o B leg tingling and pain x few months C/o R jaw pain  Wt Readings from Last 3 Encounters:  07/30/12 165 lb (74.844 kg)  07/17/12 166 lb (75.297 kg)  04/10/12 165 lb (74.844 kg)   BP Readings from Last 3 Encounters:  07/30/12 140/80  07/17/12 150/80  04/10/12 150/82      Review of Systems  Constitutional: Negative for chills, activity change, appetite change, fatigue and unexpected weight change.  HENT: Negative for congestion, mouth sores and sinus pressure.   Eyes: Negative for visual disturbance.  Respiratory: Negative for chest tightness.   Genitourinary: Negative for frequency, difficulty urinating and vaginal pain.  Musculoskeletal: Positive for back pain, arthralgias and gait problem.  Skin: Negative for pallor.  Neurological: Negative for dizziness, tremors and weakness.  Psychiatric/Behavioral: Negative for suicidal ideas, confusion and disturbed wake/sleep cycle. The patient is nervous/anxious.        Objective:   Physical Exam  Constitutional: She appears well-developed. No distress.  HENT:  Head: Normocephalic.  Left Ear: External ear normal.  Nose: Nose normal.  Mouth/Throat: Oropharynx is clear and moist.  Eyes: Conjunctivae are normal. Pupils are equal, round, and reactive to light. Right eye exhibits no discharge. Left eye exhibits no discharge.  Neck: Normal range of motion. Neck supple. No JVD present. No tracheal deviation present. No thyromegaly present.  Cardiovascular: Normal rate, regular rhythm and normal heart sounds.   Pulmonary/Chest: No stridor. No respiratory distress. She has no wheezes.  Abdominal: Soft. Bowel sounds are normal. She exhibits no  distension and no mass. There is no tenderness. There is no rebound and no guarding.  Musculoskeletal: She exhibits tenderness (LS spine is tender w/ROM). She exhibits no edema.       R wrist and R ankle are tender  Lymphadenopathy:    She has no cervical adenopathy.  Neurological: She displays normal reflexes. No cranial nerve deficit. She exhibits normal muscle tone. Coordination normal.  Skin: No rash noted. No erythema.  Psychiatric: She has a normal mood and affect. Her behavior is normal. Judgment and thought content normal.   R thumb base is tender R achilles' tendon is tender  Lab Results  Component Value Date   WBC 5.2 12/27/2011   HGB 11.3* 12/27/2011   HCT 35.1* 12/27/2011   PLT 173.0 12/27/2011   GLUCOSE 109* 07/10/2012   CHOL 148 04/03/2011   TRIG 86.0 04/03/2011   HDL 47.30 04/03/2011   LDLCALC 84 04/03/2011   ALT 13 12/27/2011   AST 16 12/27/2011   NA 143 07/10/2012   K 4.2 07/10/2012   CL 108 07/10/2012   CREATININE 1.1 07/10/2012   BUN 25* 07/10/2012   CO2 28 07/10/2012   TSH 0.53 07/10/2012   HGBA1C 6.7* 07/10/2012             Assessment & Plan:

## 2012-07-30 NOTE — Assessment & Plan Note (Signed)
Continue with current prescription therapy as reflected on the Med list.  

## 2012-07-30 NOTE — Assessment & Plan Note (Signed)
8/13 ?etiol  --- ?MSK No mass or rash Will watch

## 2012-07-30 NOTE — Assessment & Plan Note (Signed)
Will try Gabapentin 

## 2012-07-30 NOTE — Assessment & Plan Note (Signed)
Cont Pensaid She declined the injection

## 2012-08-03 ENCOUNTER — Encounter: Payer: Self-pay | Admitting: Internal Medicine

## 2012-08-26 ENCOUNTER — Other Ambulatory Visit: Payer: Self-pay | Admitting: Internal Medicine

## 2012-08-26 DIAGNOSIS — Z1231 Encounter for screening mammogram for malignant neoplasm of breast: Secondary | ICD-10-CM

## 2012-09-08 ENCOUNTER — Ambulatory Visit (INDEPENDENT_AMBULATORY_CARE_PROVIDER_SITE_OTHER): Payer: No Typology Code available for payment source | Admitting: *Deleted

## 2012-09-08 DIAGNOSIS — Z23 Encounter for immunization: Secondary | ICD-10-CM

## 2012-09-29 ENCOUNTER — Ambulatory Visit (HOSPITAL_COMMUNITY)
Admission: RE | Admit: 2012-09-29 | Discharge: 2012-09-29 | Disposition: A | Discharge status: Home / Self Care | Payer: No Typology Code available for payment source | Source: Ambulatory Visit | Attending: Internal Medicine | Admitting: Internal Medicine

## 2012-09-29 DIAGNOSIS — Z1231 Encounter for screening mammogram for malignant neoplasm of breast: Secondary | ICD-10-CM

## 2012-10-21 ENCOUNTER — Other Ambulatory Visit (INDEPENDENT_AMBULATORY_CARE_PROVIDER_SITE_OTHER): Payer: No Typology Code available for payment source

## 2012-10-21 ENCOUNTER — Encounter: Payer: Self-pay | Admitting: Internal Medicine

## 2012-10-21 ENCOUNTER — Ambulatory Visit (INDEPENDENT_AMBULATORY_CARE_PROVIDER_SITE_OTHER): Payer: No Typology Code available for payment source | Admitting: Internal Medicine

## 2012-10-21 VITALS — BP 140/76 | HR 68 | Temp 98.5°F | Resp 16 | Wt 163.0 lb

## 2012-10-21 DIAGNOSIS — J069 Acute upper respiratory infection, unspecified: Secondary | ICD-10-CM

## 2012-10-21 DIAGNOSIS — M545 Low back pain, unspecified: Secondary | ICD-10-CM

## 2012-10-21 DIAGNOSIS — E119 Type 2 diabetes mellitus without complications: Secondary | ICD-10-CM

## 2012-10-21 DIAGNOSIS — Z862 Personal history of diseases of the blood and blood-forming organs and certain disorders involving the immune mechanism: Secondary | ICD-10-CM

## 2012-10-21 LAB — URINALYSIS, ROUTINE W REFLEX MICROSCOPIC
Bilirubin Urine: NEGATIVE
Hgb urine dipstick: NEGATIVE
Ketones, ur: NEGATIVE
Nitrite: NEGATIVE
Specific Gravity, Urine: 1.01 (ref 1.000–1.030)
Urine Glucose: NEGATIVE
Urobilinogen, UA: 0.2 (ref 0.0–1.0)
pH: 7.5 (ref 5.0–8.0)

## 2012-10-21 LAB — CBC WITH DIFFERENTIAL/PLATELET
Basophils Absolute: 0 10*3/uL (ref 0.0–0.1)
Basophils Relative: 0.4 % (ref 0.0–3.0)
Eosinophils Absolute: 0.1 10*3/uL (ref 0.0–0.7)
Eosinophils Relative: 2.2 % (ref 0.0–5.0)
HCT: 35.7 % — ABNORMAL LOW (ref 36.0–46.0)
Hemoglobin: 11.3 g/dL — ABNORMAL LOW (ref 12.0–15.0)
Lymphocytes Relative: 26.1 % (ref 12.0–46.0)
Lymphs Abs: 1.2 10*3/uL (ref 0.7–4.0)
MCHC: 31.7 g/dL (ref 30.0–36.0)
MCV: 88.6 fl (ref 78.0–100.0)
Monocytes Absolute: 0.4 10*3/uL (ref 0.1–1.0)
Monocytes Relative: 8.9 % (ref 3.0–12.0)
Neutro Abs: 2.9 10*3/uL (ref 1.4–7.7)
Neutrophils Relative %: 62.4 % (ref 43.0–77.0)
Platelets: 155 10*3/uL (ref 150.0–400.0)
RBC: 4.03 Mil/uL (ref 3.87–5.11)
RDW: 17.3 % — ABNORMAL HIGH (ref 11.5–14.6)
WBC: 4.6 10*3/uL (ref 4.5–10.5)

## 2012-10-21 LAB — BASIC METABOLIC PANEL
BUN: 28 mg/dL — ABNORMAL HIGH (ref 6–23)
CO2: 28 mEq/L (ref 19–32)
Calcium: 9.1 mg/dL (ref 8.4–10.5)
Chloride: 104 mEq/L (ref 96–112)
Creatinine, Ser: 1 mg/dL (ref 0.4–1.2)
GFR: 66.45 mL/min (ref >60.00)
Glucose, Bld: 98 mg/dL (ref 70–99)
Potassium: 3.9 mEq/L (ref 3.5–5.1)
Sodium: 140 mEq/L (ref 135–145)

## 2012-10-21 LAB — HEMOGLOBIN A1C: Hgb A1c MFr Bld: 6.4 % (ref 4.6–6.5)

## 2012-10-21 NOTE — Assessment & Plan Note (Signed)
CBC

## 2012-10-21 NOTE — Assessment & Plan Note (Signed)
Continue with current prescription therapy as reflected on the Med list.  

## 2012-10-21 NOTE — Assessment & Plan Note (Signed)
UA

## 2012-10-21 NOTE — Assessment & Plan Note (Signed)
OTC meds Labs - all if worse

## 2012-10-21 NOTE — Progress Notes (Signed)
Patient ID: Breanna [name], female   DOB: 1931/05/08, 78 y.o.   MRN: [id number]  Subjective:    Patient ID: Breanna [name], female    DOB: [birth year], 78 y.o.   MRN: [id number]  Headache  This is a new problem. The quality of the pain is described as dull. The pain is mild. Associated symptoms include back pain. Pertinent negatives include no dizziness, sinus pressure or weakness.  C/o dizziness, chills, hoarseness x 1 wk  The patient presents for a follow-up of  chronic hypertension, chronic dyslipidemia, type 2 diabetes controlled with medicines F/u on R knee pain and LBP, OA.  She has been doing water aerobics   Wt Readings from Last 3 Encounters:  10/21/12 163 lb (73.936 kg)  07/30/12 165 lb (74.844 kg)  07/17/12 166 lb (75.297 kg)   BP Readings from Last 3 Encounters:  10/21/12 140/76  07/30/12 140/80  07/17/12 150/80      Review of Systems  Constitutional: Negative for chills, activity change, appetite change, fatigue and unexpected weight change.  HENT: Negative for congestion, mouth sores and sinus pressure.   Eyes: Negative for visual disturbance.  Respiratory: Negative for chest tightness.   Genitourinary: Negative for frequency, difficulty urinating and vaginal pain.  Musculoskeletal: Positive for back pain, arthralgias and gait problem.  Skin: Negative for pallor.  Neurological: Positive for headaches. Negative for dizziness, tremors and weakness.  Psychiatric/Behavioral: Negative for suicidal ideas, confusion and disturbed wake/sleep cycle. The patient is nervous/anxious.        Objective:   Physical Exam  Constitutional: She appears well-developed. No distress.  HENT:  Head: Normocephalic.  Left Ear: External ear normal.  Nose: Nose normal.  Mouth/Throat: Oropharynx is clear and moist.  Eyes: Conjunctivae normal are normal. Pupils are equal, round, and reactive to light. Right eye exhibits no discharge. Left eye exhibits no discharge.  Neck: Normal range of motion.  Neck supple. No JVD present. No tracheal deviation present. No thyromegaly present.  Cardiovascular: Normal rate, regular rhythm and normal heart sounds.   Pulmonary/Chest: No stridor. No respiratory distress. She has no wheezes.  Abdominal: Soft. Bowel sounds are normal. She exhibits no distension and no mass. There is no tenderness. There is no rebound and no guarding.  Musculoskeletal: She exhibits tenderness (LS spine is tender w/ROM). She exhibits no edema.       R wrist and R ankle are tender  Lymphadenopathy:    She has no cervical adenopathy.  Neurological: She displays normal reflexes. No cranial nerve deficit. She exhibits normal muscle tone. Coordination normal.  Skin: No rash noted. No erythema.  Psychiatric: She has a normal mood and affect. Her behavior is normal. Judgment and thought content normal.   R thumb base is tender R achilles' tendon is tender  Lab Results  Component Value Date   WBC 5.2 12/27/2011   HGB 11.3* 12/27/2011   HCT 35.1* 12/27/2011   PLT 173.0 12/27/2011   GLUCOSE 109* 07/10/2012   CHOL 148 04/03/2011   TRIG 86.0 04/03/2011   HDL 47.30 04/03/2011   LDLCALC 84 04/03/2011   ALT 13 12/27/2011   AST 16 12/27/2011   NA 143 07/10/2012   K 4.2 07/10/2012   CL 108 07/10/2012   CREATININE 1.1 07/10/2012   BUN 25* 07/10/2012   CO2 28 07/10/2012   TSH 0.53 07/10/2012   HGBA1C 6.7* 07/10/2012             Assessment & Plan:

## 2012-10-23 ENCOUNTER — Other Ambulatory Visit: Payer: Self-pay | Admitting: Internal Medicine

## 2012-10-23 ENCOUNTER — Other Ambulatory Visit: Payer: Self-pay | Admitting: *Deleted

## 2012-10-23 MED ORDER — TERCONAZOLE 0.4 % VA CREA: 1.0000 | TOPICAL_CREAM | Freq: Every day | VAGINAL | Status: DC

## 2012-11-03 ENCOUNTER — Ambulatory Visit: Payer: No Typology Code available for payment source | Admitting: Internal Medicine

## 2012-11-05 ENCOUNTER — Telehealth: Payer: Self-pay | Admitting: *Deleted

## 2012-11-05 NOTE — Telephone Encounter (Signed)
Pt does want a folding cane and back brace from [facility]. I called them [phone]  to request order be faxed back to [place]. I left a detailed mess re: above

## 2012-11-27 ENCOUNTER — Other Ambulatory Visit: Payer: Self-pay | Admitting: Internal Medicine

## 2012-12-22 ENCOUNTER — Other Ambulatory Visit: Payer: Self-pay | Admitting: *Deleted

## 2012-12-22 ENCOUNTER — Other Ambulatory Visit (INDEPENDENT_AMBULATORY_CARE_PROVIDER_SITE_OTHER): Payer: No Typology Code available for payment source

## 2012-12-22 ENCOUNTER — Encounter: Payer: Self-pay | Admitting: Internal Medicine

## 2012-12-22 ENCOUNTER — Ambulatory Visit (INDEPENDENT_AMBULATORY_CARE_PROVIDER_SITE_OTHER): Payer: No Typology Code available for payment source | Admitting: Internal Medicine

## 2012-12-22 VITALS — BP 140/76 | HR 68 | Temp 98.1°F | Resp 16 | Wt 159.0 lb

## 2012-12-22 DIAGNOSIS — I10 Essential (primary) hypertension: Secondary | ICD-10-CM

## 2012-12-22 DIAGNOSIS — R519 Headache, unspecified: Secondary | ICD-10-CM | POA: Insufficient documentation

## 2012-12-22 DIAGNOSIS — I6350 Cerebral infarction due to unspecified occlusion or stenosis of unspecified cerebral artery: Secondary | ICD-10-CM

## 2012-12-22 DIAGNOSIS — E119 Type 2 diabetes mellitus without complications: Secondary | ICD-10-CM

## 2012-12-22 DIAGNOSIS — E1149 Type 2 diabetes mellitus with other diabetic neurological complication: Secondary | ICD-10-CM

## 2012-12-22 DIAGNOSIS — R634 Abnormal weight loss: Secondary | ICD-10-CM

## 2012-12-22 DIAGNOSIS — Z862 Personal history of diseases of the blood and blood-forming organs and certain disorders involving the immune mechanism: Secondary | ICD-10-CM

## 2012-12-22 DIAGNOSIS — E1140 Type 2 diabetes mellitus with diabetic neuropathy, unspecified: Secondary | ICD-10-CM

## 2012-12-22 DIAGNOSIS — R51 Headache: Secondary | ICD-10-CM | POA: Insufficient documentation

## 2012-12-22 DIAGNOSIS — J069 Acute upper respiratory infection, unspecified: Secondary | ICD-10-CM

## 2012-12-22 DIAGNOSIS — E1142 Type 2 diabetes mellitus with diabetic polyneuropathy: Secondary | ICD-10-CM

## 2012-12-22 LAB — BASIC METABOLIC PANEL
BUN: 22 mg/dL (ref 6–23)
CO2: 30 mEq/L (ref 19–32)
Calcium: 9.1 mg/dL (ref 8.4–10.5)
Chloride: 105 mEq/L (ref 96–112)
Creatinine, Ser: 1 mg/dL (ref 0.4–1.2)
GFR: 67.94 mL/min (ref >60.00)
Glucose, Bld: 111 mg/dL — ABNORMAL HIGH (ref 70–99)
Potassium: 4.1 mEq/L (ref 3.5–5.1)
Sodium: 141 mEq/L (ref 135–145)

## 2012-12-22 LAB — HEMOGLOBIN A1C: Hgb A1c MFr Bld: 7 % — ABNORMAL HIGH (ref 4.6–6.5)

## 2012-12-22 NOTE — Progress Notes (Signed)
   Subjective:    HPI  C/o fatigue, chills, lack of appetite since 10/13. She went to UC 1 mo ago and got an abx it made her fill better for a while...  The patient presents for a follow-up of  chronic hypertension, chronic dyslipidemia, type 2 diabetes controlled with medicines F/u on R knee pain and LBP, OA.  She has been doing water aerobics C/o B leg tingling and pain x few months C/o R jaw pain  Wt Readings from Last 3 Encounters:  12/22/12 159 lb (72.122 kg)  10/21/12 163 lb (73.936 kg)  07/30/12 165 lb (74.844 kg)   BP Readings from Last 3 Encounters:  12/22/12 140/76  10/21/12 140/76  07/30/12 140/80      Review of Systems  Constitutional: Negative for chills, activity change, appetite change, fatigue and unexpected weight change.  HENT: Negative for congestion, mouth sores and sinus pressure.   Eyes: Negative for visual disturbance.  Respiratory: Negative for chest tightness.   Genitourinary: Negative for frequency, difficulty urinating and vaginal pain.  Musculoskeletal: Positive for back pain, arthralgias and gait problem.  Skin: Negative for pallor.  Neurological: Negative for dizziness, tremors and weakness.  Psychiatric/Behavioral: Negative for suicidal ideas, confusion and sleep disturbance. The patient is nervous/anxious.        Objective:   Physical Exam  Constitutional: She appears well-developed. No distress.  HENT:  Head: Normocephalic.  Left Ear: External ear normal.  Nose: Nose normal.  Mouth/Throat: Oropharynx is clear and moist.  Eyes: Conjunctivae normal are normal. Pupils are equal, round, and reactive to light. Right eye exhibits no discharge. Left eye exhibits no discharge.  Neck: Normal range of motion. Neck supple. No JVD present. No tracheal deviation present. No thyromegaly present.  Cardiovascular: Normal rate, regular rhythm and normal heart sounds.   Pulmonary/Chest: No stridor. No respiratory distress. She has no wheezes.    Abdominal: Soft. Bowel sounds are normal. She exhibits no distension and no mass. There is no tenderness. There is no rebound and no guarding.  Musculoskeletal: She exhibits tenderness (LS spine is tender w/ROM). She exhibits no edema.       R wrist and R ankle are tender  Lymphadenopathy:    She has no cervical adenopathy.  Neurological: She displays normal reflexes. No cranial nerve deficit. She exhibits normal muscle tone. Coordination normal.  Skin: No rash noted. No erythema.  Psychiatric: She has a normal mood and affect. Her behavior is normal. Judgment and thought content normal.     Lab Results  Component Value Date   WBC 4.6 10/21/2012   HGB 11.3* 10/21/2012   HCT 35.7* 10/21/2012   PLT 155.0 10/21/2012   GLUCOSE 98 10/21/2012   CHOL 148 04/03/2011   TRIG 86.0 04/03/2011   HDL 47.30 04/03/2011   LDLCALC 84 04/03/2011   ALT 13 12/27/2011   AST 16 12/27/2011   NA 140 10/21/2012   K 3.9 10/21/2012   CL 104 10/21/2012   CREATININE 1.0 10/21/2012   BUN 28* 10/21/2012   CO2 28 10/21/2012   TSH 0.53 07/10/2012   HGBA1C 6.4 10/21/2012             Assessment & Plan:

## 2012-12-22 NOTE — Assessment & Plan Note (Signed)
?  etiology -- 2013 Sinus CT CXR if not better Labs pending

## 2012-12-22 NOTE — Assessment & Plan Note (Signed)
Continue with current prescription therapy as reflected on the Med list.  

## 2012-12-22 NOTE — Assessment & Plan Note (Signed)
Sinus CT 

## 2012-12-22 NOTE — Assessment & Plan Note (Signed)
R/o chronic sinusitis Sinus CT

## 2012-12-23 ENCOUNTER — Ambulatory Visit (INDEPENDENT_AMBULATORY_CARE_PROVIDER_SITE_OTHER)
Admission: RE | Admit: 2012-12-23 | Discharge: 2012-12-23 | Disposition: A | Discharge status: Home / Self Care | Payer: No Typology Code available for payment source | Source: Ambulatory Visit | Attending: Internal Medicine | Admitting: Internal Medicine

## 2012-12-23 DIAGNOSIS — I6350 Cerebral infarction due to unspecified occlusion or stenosis of unspecified cerebral artery: Secondary | ICD-10-CM

## 2012-12-23 DIAGNOSIS — J069 Acute upper respiratory infection, unspecified: Secondary | ICD-10-CM

## 2012-12-23 DIAGNOSIS — R51 Headache: Secondary | ICD-10-CM

## 2012-12-23 LAB — CBC WITH DIFFERENTIAL/PLATELET
Basophils Absolute: 0 10*3/uL (ref 0.0–0.1)
Basophils Relative: 0 % (ref 0.0–3.0)
Eosinophils Absolute: 0.2 10*3/uL (ref 0.0–0.7)
Eosinophils Relative: 4 % (ref 0.0–5.0)
HCT: 33.9 % — ABNORMAL LOW (ref 36.0–46.0)
Hemoglobin: 10.6 g/dL — ABNORMAL LOW (ref 12.0–15.0)
Lymphocytes Relative: 22.7 % (ref 12.0–46.0)
Lymphs Abs: 1 10*3/uL (ref 0.7–4.0)
MCHC: 31.2 g/dL (ref 30.0–36.0)
MCV: 88 fl (ref 78.0–100.0)
Monocytes Absolute: 0.3 10*3/uL (ref 0.1–1.0)
Monocytes Relative: 6.9 % (ref 3.0–12.0)
Neutro Abs: 2.9 10*3/uL (ref 1.4–7.7)
Neutrophils Relative %: 66.4 % (ref 43.0–77.0)
Platelets: 147 10*3/uL — ABNORMAL LOW (ref 150.0–400.0)
RBC: 3.85 Mil/uL — ABNORMAL LOW (ref 3.87–5.11)
RDW: 17.2 % — ABNORMAL HIGH (ref 11.5–14.6)
WBC: 4.4 10*3/uL — ABNORMAL LOW (ref 4.5–10.5)

## 2012-12-25 ENCOUNTER — Telehealth: Payer: Self-pay | Admitting: Internal Medicine

## 2012-12-25 DIAGNOSIS — D61818 Other pancytopenia: Secondary | ICD-10-CM

## 2012-12-25 NOTE — Telephone Encounter (Signed)
Pt informed/ labs entered.  

## 2012-12-25 NOTE — Telephone Encounter (Signed)
Breanna [name], please, inform patient that all labs are normal except for anemia RTC 1 mo with CBC, peripheral pathology blood smear (dx: pancytopenia on the last CBC), BMET Thx

## 2012-12-25 NOTE — Telephone Encounter (Signed)
Breanna [name], please, inform patient that her sinus CT was nl Thx

## 2012-12-31 ENCOUNTER — Other Ambulatory Visit: Payer: Self-pay | Admitting: Internal Medicine

## 2013-01-19 ENCOUNTER — Other Ambulatory Visit (INDEPENDENT_AMBULATORY_CARE_PROVIDER_SITE_OTHER): Payer: No Typology Code available for payment source

## 2013-01-19 DIAGNOSIS — D61818 Other pancytopenia: Secondary | ICD-10-CM

## 2013-01-19 LAB — BASIC METABOLIC PANEL
BUN: 19 mg/dL (ref 6–23)
CO2: 28 mEq/L (ref 19–32)
Calcium: 9.4 mg/dL (ref 8.4–10.5)
Chloride: 104 mEq/L (ref 96–112)
Creatinine, Ser: 0.8 mg/dL (ref 0.4–1.2)
GFR: 86.4 mL/min (ref >60.00)
Glucose, Bld: 118 mg/dL — ABNORMAL HIGH (ref 70–99)
Potassium: 3.5 mEq/L (ref 3.5–5.1)
Sodium: 140 mEq/L (ref 135–145)

## 2013-01-19 LAB — CBC WITH DIFFERENTIAL/PLATELET
Basophils Absolute: 0 10*3/uL (ref 0.0–0.1)
Basophils Relative: 0.5 % (ref 0.0–3.0)
Eosinophils Absolute: 0.2 10*3/uL (ref 0.0–0.7)
Eosinophils Relative: 3.8 % (ref 0.0–5.0)
HCT: 35.3 % — ABNORMAL LOW (ref 36.0–46.0)
Hemoglobin: 11.4 g/dL — ABNORMAL LOW (ref 12.0–15.0)
Lymphocytes Relative: 30.5 % (ref 12.0–46.0)
Lymphs Abs: 1.4 10*3/uL (ref 0.7–4.0)
MCHC: 32.3 g/dL (ref 30.0–36.0)
MCV: 86.4 fl (ref 78.0–100.0)
Monocytes Absolute: 0.4 10*3/uL (ref 0.1–1.0)
Monocytes Relative: 8.2 % (ref 3.0–12.0)
Neutro Abs: 2.6 10*3/uL (ref 1.4–7.7)
Neutrophils Relative %: 57 % (ref 43.0–77.0)
Platelets: 179 10*3/uL (ref 150.0–400.0)
RBC: 4.09 Mil/uL (ref 3.87–5.11)
RDW: 17.6 % — ABNORMAL HIGH (ref 11.5–14.6)
WBC: 4.6 10*3/uL (ref 4.5–10.5)

## 2013-01-20 LAB — PATHOLOGIST SMEAR REVIEW

## 2013-01-22 ENCOUNTER — Ambulatory Visit: Payer: No Typology Code available for payment source | Admitting: Internal Medicine

## 2013-01-22 ENCOUNTER — Telehealth: Payer: Self-pay | Admitting: Internal Medicine

## 2013-01-22 ENCOUNTER — Ambulatory Visit (INDEPENDENT_AMBULATORY_CARE_PROVIDER_SITE_OTHER): Payer: 59 | Admitting: Internal Medicine

## 2013-01-22 ENCOUNTER — Encounter: Payer: Self-pay | Admitting: Internal Medicine

## 2013-01-22 VITALS — BP 140/80 | HR 80 | Temp 98.5°F | Resp 16 | Wt 162.0 lb

## 2013-01-22 DIAGNOSIS — R634 Abnormal weight loss: Secondary | ICD-10-CM

## 2013-01-22 DIAGNOSIS — I10 Essential (primary) hypertension: Secondary | ICD-10-CM

## 2013-01-22 DIAGNOSIS — J069 Acute upper respiratory infection, unspecified: Secondary | ICD-10-CM

## 2013-01-22 DIAGNOSIS — E119 Type 2 diabetes mellitus without complications: Secondary | ICD-10-CM

## 2013-01-22 DIAGNOSIS — R51 Headache: Secondary | ICD-10-CM

## 2013-01-22 DIAGNOSIS — M109 Gout, unspecified: Secondary | ICD-10-CM

## 2013-01-22 MED ORDER — TRIAMCINOLONE ACETONIDE 0.5 % EX CREA: TOPICAL_CREAM | Freq: Two times a day (BID) | CUTANEOUS | Status: DC | PRN

## 2013-01-22 MED ORDER — SERTRALINE HCL 100 MG PO TABS: 100.0000 mg | ORAL_TABLET | Freq: Every day | ORAL | Status: DC

## 2013-01-22 NOTE — Assessment & Plan Note (Signed)
No relapse 

## 2013-01-22 NOTE — Telephone Encounter (Signed)
Ms.Kary wants to know if she needs labs before her May 1 appt.

## 2013-01-22 NOTE — Assessment & Plan Note (Signed)
Continue with current prescription therapy as reflected on the Med list.  

## 2013-01-22 NOTE — Assessment & Plan Note (Signed)
Wt Readings from Last 3 Encounters:  01/22/13 162 lb (73.483 kg)  12/22/12 159 lb (72.122 kg)  10/21/12 163 lb (73.936 kg)

## 2013-01-22 NOTE — Progress Notes (Signed)
   Subjective:    HPI  F/u fatigue, chills, lack of appetite since 10/13. She is getting better. It was likely a virus. She is still tired however... C/o stress at home  The patient presents for a follow-up of  chronic hypertension, chronic dyslipidemia, type 2 diabetes controlled with medicines F/u on R knee pain and LBP, OA.   She has been doing water aerobics  F/u - B leg tingling and pain x few months, better   Wt Readings from Last 3 Encounters:  01/22/13 162 lb (73.483 kg)  12/22/12 159 lb (72.122 kg)  10/21/12 163 lb (73.936 kg)   BP Readings from Last 3 Encounters:  01/22/13 140/80  12/22/12 140/76  10/21/12 140/76      Review of Systems  Constitutional: Negative for chills, activity change, appetite change, fatigue and unexpected weight change.  HENT: Negative for congestion, mouth sores and sinus pressure.   Eyes: Negative for visual disturbance.  Respiratory: Negative for chest tightness.   Genitourinary: Negative for frequency, difficulty urinating and vaginal pain.  Musculoskeletal: Positive for back pain, arthralgias and gait problem.  Skin: Negative for pallor.  Neurological: Negative for dizziness, tremors and weakness.  Psychiatric/Behavioral: Negative for suicidal ideas, confusion and sleep disturbance. The patient is nervous/anxious.        Objective:   Physical Exam  Constitutional: She appears well-developed. No distress.  HENT:  Head: Normocephalic.  Left Ear: External ear normal.  Nose: Nose normal.  Mouth/Throat: Oropharynx is clear and moist.  Eyes: Conjunctivae normal are normal. Pupils are equal, round, and reactive to light. Right eye exhibits no discharge. Left eye exhibits no discharge.  Neck: Normal range of motion. Neck supple. No JVD present. No tracheal deviation present. No thyromegaly present.  Cardiovascular: Normal rate, regular rhythm and normal heart sounds.   Pulmonary/Chest: No stridor. No respiratory distress. She has no  wheezes.  Abdominal: Soft. Bowel sounds are normal. She exhibits no distension and no mass. There is no tenderness. There is no rebound and no guarding.  Musculoskeletal: She exhibits tenderness (LS spine is tender w/ROM). She exhibits no edema.       R wrist and R ankle are tender  Lymphadenopathy:    She has no cervical adenopathy.  Neurological: She displays normal reflexes. No cranial nerve deficit. She exhibits normal muscle tone. Coordination normal.  Skin: No rash noted. No erythema.  Psychiatric: She has a normal mood and affect. Her behavior is normal. Judgment and thought content normal.     Lab Results  Component Value Date   WBC 4.6 01/19/2013   HGB 11.4* 01/19/2013   HCT 35.3* 01/19/2013   PLT 179.0 01/19/2013   GLUCOSE 118* 01/19/2013   CHOL 148 04/03/2011   TRIG 86.0 04/03/2011   HDL 47.30 04/03/2011   LDLCALC 84 04/03/2011   ALT 13 12/27/2011   AST 16 12/27/2011   NA 140 01/19/2013   K 3.5 01/19/2013   CL 104 01/19/2013   CREATININE 0.8 01/19/2013   BUN 19 01/19/2013   CO2 28 01/19/2013   TSH 0.53 07/10/2012   HGBA1C 7.0* 12/22/2012             Assessment & Plan:

## 2013-01-22 NOTE — Telephone Encounter (Signed)
Yes. Thx.

## 2013-01-22 NOTE — Assessment & Plan Note (Signed)
resolved 

## 2013-01-22 NOTE — Assessment & Plan Note (Signed)
Resolved

## 2013-01-27 NOTE — Telephone Encounter (Signed)
Please advise what labs.  

## 2013-01-27 NOTE — Telephone Encounter (Signed)
BMET, A1c Thx 

## 2013-01-28 NOTE — Telephone Encounter (Signed)
Labs entered.

## 2013-02-12 ENCOUNTER — Telehealth: Payer: Self-pay | Admitting: Internal Medicine

## 2013-02-12 NOTE — Telephone Encounter (Signed)
Left mess for patient to call back.  

## 2013-02-12 NOTE — Telephone Encounter (Signed)
Patient is requesting a call back with Misty Stanley so she can speak with her about the medications she needs refilled with her mail order pharmacy

## 2013-02-13 MED ORDER — ALLOPURINOL 300 MG PO TABS: 300.0000 mg | ORAL_TABLET | Freq: Every day | ORAL | Status: DC

## 2013-02-13 MED ORDER — GABAPENTIN 100 MG PO CAPS: 100.0000 mg | ORAL_CAPSULE | Freq: Three times a day (TID) | ORAL | Status: DC | PRN

## 2013-02-13 MED ORDER — TORSEMIDE 20 MG PO TABS: 20.0000 mg | ORAL_TABLET | Freq: Every day | ORAL | Status: DC

## 2013-02-13 MED ORDER — AMLODIPINE BESYLATE 5 MG PO TABS: 5.0000 mg | ORAL_TABLET | Freq: Every day | ORAL | Status: DC

## 2013-02-13 MED ORDER — PANTOPRAZOLE SODIUM 40 MG PO TBEC: 40.0000 mg | DELAYED_RELEASE_TABLET | Freq: Every day | ORAL | Status: DC

## 2013-02-13 MED ORDER — METFORMIN HCL 850 MG PO TABS: 850.0000 mg | ORAL_TABLET | Freq: Two times a day (BID) | ORAL | Status: DC

## 2013-02-13 MED ORDER — CARVEDILOL 25 MG PO TABS: 25.0000 mg | ORAL_TABLET | Freq: Two times a day (BID) | ORAL | Status: DC

## 2013-02-13 NOTE — Telephone Encounter (Signed)
Patient left message on triage returning Staceys call, she can be reached at [phone]

## 2013-02-13 NOTE — Telephone Encounter (Signed)
Spoke to pt- Rfs sent.

## 2013-02-17 ENCOUNTER — Telehealth: Payer: Self-pay | Admitting: Internal Medicine

## 2013-02-17 NOTE — Telephone Encounter (Signed)
Called [facility] and spoke Illene. Gave her verbal for the four meds below. She states they will be Rushed and pt should receive them in 2-3 days. I left detailed mess informing pt of this.

## 2013-02-17 NOTE — Telephone Encounter (Signed)
Pt came in the office and stated that [facility] did not get the refill for Amlodipine, Torseminde, Metformin and Gabapentin. Please check with pt pharmacy and call pt back.

## 2013-03-23 ENCOUNTER — Encounter (HOSPITAL_COMMUNITY): Payer: Self-pay

## 2013-03-23 ENCOUNTER — Inpatient Hospital Stay (HOSPITAL_COMMUNITY)
Admission: EM | Admit: 2013-03-23 | Discharge: 2013-03-25 | DRG: 641 | Disposition: A | Discharge status: Home / Self Care | Payer: 59 | Attending: Internal Medicine | Admitting: Internal Medicine

## 2013-03-23 DIAGNOSIS — D72829 Elevated white blood cell count, unspecified: Secondary | ICD-10-CM | POA: Diagnosis present

## 2013-03-23 DIAGNOSIS — R1011 Right upper quadrant pain: Secondary | ICD-10-CM

## 2013-03-23 DIAGNOSIS — R1310 Dysphagia, unspecified: Secondary | ICD-10-CM

## 2013-03-23 DIAGNOSIS — Z881 Allergy status to other antibiotic agents status: Secondary | ICD-10-CM

## 2013-03-23 DIAGNOSIS — N289 Disorder of kidney and ureter, unspecified: Secondary | ICD-10-CM

## 2013-03-23 DIAGNOSIS — N1330 Unspecified hydronephrosis: Secondary | ICD-10-CM

## 2013-03-23 DIAGNOSIS — K5730 Diverticulosis of large intestine without perforation or abscess without bleeding: Secondary | ICD-10-CM

## 2013-03-23 DIAGNOSIS — E1140 Type 2 diabetes mellitus with diabetic neuropathy, unspecified: Secondary | ICD-10-CM

## 2013-03-23 DIAGNOSIS — Z9049 Acquired absence of other specified parts of digestive tract: Secondary | ICD-10-CM

## 2013-03-23 DIAGNOSIS — M109 Gout, unspecified: Secondary | ICD-10-CM

## 2013-03-23 DIAGNOSIS — E119 Type 2 diabetes mellitus without complications: Secondary | ICD-10-CM

## 2013-03-23 DIAGNOSIS — Z79899 Other long term (current) drug therapy: Secondary | ICD-10-CM

## 2013-03-23 DIAGNOSIS — I10 Essential (primary) hypertension: Secondary | ICD-10-CM

## 2013-03-23 DIAGNOSIS — M79609 Pain in unspecified limb: Secondary | ICD-10-CM

## 2013-03-23 DIAGNOSIS — E860 Dehydration: Principal | ICD-10-CM

## 2013-03-23 DIAGNOSIS — M542 Cervicalgia: Secondary | ICD-10-CM

## 2013-03-23 DIAGNOSIS — J069 Acute upper respiratory infection, unspecified: Secondary | ICD-10-CM

## 2013-03-23 DIAGNOSIS — Z8719 Personal history of other diseases of the digestive system: Secondary | ICD-10-CM

## 2013-03-23 DIAGNOSIS — Z8249 Family history of ischemic heart disease and other diseases of the circulatory system: Secondary | ICD-10-CM

## 2013-03-23 DIAGNOSIS — M1990 Unspecified osteoarthritis, unspecified site: Secondary | ICD-10-CM

## 2013-03-23 DIAGNOSIS — R197 Diarrhea, unspecified: Secondary | ICD-10-CM

## 2013-03-23 DIAGNOSIS — Z8673 Personal history of transient ischemic attack (TIA), and cerebral infarction without residual deficits: Secondary | ICD-10-CM

## 2013-03-23 DIAGNOSIS — K219 Gastro-esophageal reflux disease without esophagitis: Secondary | ICD-10-CM

## 2013-03-23 DIAGNOSIS — M545 Low back pain, unspecified: Secondary | ICD-10-CM

## 2013-03-23 DIAGNOSIS — Z862 Personal history of diseases of the blood and blood-forming organs and certain disorders involving the immune mechanism: Secondary | ICD-10-CM

## 2013-03-23 DIAGNOSIS — A059 Bacterial foodborne intoxication, unspecified: Secondary | ICD-10-CM

## 2013-03-23 DIAGNOSIS — Z888 Allergy status to other drugs, medicaments and biological substances status: Secondary | ICD-10-CM

## 2013-03-23 DIAGNOSIS — I2789 Other specified pulmonary heart diseases: Secondary | ICD-10-CM | POA: Diagnosis present

## 2013-03-23 DIAGNOSIS — Z803 Family history of malignant neoplasm of breast: Secondary | ICD-10-CM

## 2013-03-23 DIAGNOSIS — B9789 Other viral agents as the cause of diseases classified elsewhere: Secondary | ICD-10-CM | POA: Diagnosis present

## 2013-03-23 DIAGNOSIS — Z885 Allergy status to narcotic agent status: Secondary | ICD-10-CM

## 2013-03-23 DIAGNOSIS — I6350 Cerebral infarction due to unspecified occlusion or stenosis of unspecified cerebral artery: Secondary | ICD-10-CM

## 2013-03-23 DIAGNOSIS — Z860100 Personal history of colon polyps, unspecified: Secondary | ICD-10-CM

## 2013-03-23 DIAGNOSIS — E1129 Type 2 diabetes mellitus with other diabetic kidney complication: Secondary | ICD-10-CM | POA: Diagnosis present

## 2013-03-23 DIAGNOSIS — M25531 Pain in right wrist: Secondary | ICD-10-CM

## 2013-03-23 DIAGNOSIS — R634 Abnormal weight loss: Secondary | ICD-10-CM

## 2013-03-23 DIAGNOSIS — E785 Hyperlipidemia, unspecified: Secondary | ICD-10-CM

## 2013-03-23 DIAGNOSIS — Z833 Family history of diabetes mellitus: Secondary | ICD-10-CM

## 2013-03-23 DIAGNOSIS — Z886 Allergy status to analgesic agent status: Secondary | ICD-10-CM

## 2013-03-23 DIAGNOSIS — A084 Viral intestinal infection, unspecified: Secondary | ICD-10-CM | POA: Diagnosis present

## 2013-03-23 DIAGNOSIS — R159 Full incontinence of feces: Secondary | ICD-10-CM

## 2013-03-23 DIAGNOSIS — Z96659 Presence of unspecified artificial knee joint: Secondary | ICD-10-CM

## 2013-03-23 DIAGNOSIS — E876 Hypokalemia: Secondary | ICD-10-CM | POA: Diagnosis present

## 2013-03-23 DIAGNOSIS — R209 Unspecified disturbances of skin sensation: Secondary | ICD-10-CM

## 2013-03-23 DIAGNOSIS — R51 Headache: Secondary | ICD-10-CM

## 2013-03-23 LAB — CBC WITH DIFFERENTIAL/PLATELET
Basophils Absolute: 0 10*3/uL (ref 0.0–0.1)
Basophils Relative: 0 % (ref 0–1)
Eosinophils Absolute: 0 10*3/uL (ref 0.0–0.7)
Eosinophils Relative: 0 % (ref 0–5)
HCT: 33 % — ABNORMAL LOW (ref 36.0–46.0)
Hemoglobin: 11.3 g/dL — ABNORMAL LOW (ref 12.0–15.0)
Lymphocytes Relative: 5 % — ABNORMAL LOW (ref 12–46)
Lymphs Abs: 1 10*3/uL (ref 0.7–4.0)
MCH: 28 pg (ref 26.0–34.0)
MCHC: 34.2 g/dL (ref 30.0–36.0)
MCV: 81.7 fL (ref 78.0–100.0)
Monocytes Absolute: 1.2 10*3/uL — ABNORMAL HIGH (ref 0.1–1.0)
Monocytes Relative: 6 % (ref 3–12)
Neutro Abs: 17.5 10*3/uL — ABNORMAL HIGH (ref 1.7–7.7)
Neutrophils Relative %: 89 % — ABNORMAL HIGH (ref 43–77)
Platelets: 126 10*3/uL — ABNORMAL LOW (ref 150–400)
RBC: 4.04 MIL/uL (ref 3.87–5.11)
RDW: 15.7 % — ABNORMAL HIGH (ref 11.5–15.5)
Smear Review: DECREASED
WBC: 19.7 10*3/uL — ABNORMAL HIGH (ref 4.0–10.5)

## 2013-03-23 LAB — COMPREHENSIVE METABOLIC PANEL
ALT: 24 U/L (ref 0–35)
AST: 34 U/L (ref 0–37)
Albumin: 3.3 g/dL — ABNORMAL LOW (ref 3.5–5.2)
Alkaline Phosphatase: 89 U/L (ref 39–117)
BUN: 37 mg/dL — ABNORMAL HIGH (ref 6–23)
CO2: 26 mEq/L (ref 19–32)
Calcium: 9.1 mg/dL (ref 8.4–10.5)
Chloride: 101 mEq/L (ref 96–112)
Creatinine, Ser: 1.3 mg/dL — ABNORMAL HIGH (ref 0.50–1.10)
GFR calc Af Amer: 44 mL/min — ABNORMAL LOW (ref >90)
GFR calc non Af Amer: 38 mL/min — ABNORMAL LOW (ref >90)
Glucose, Bld: 156 mg/dL — ABNORMAL HIGH (ref 70–99)
Potassium: 3 mEq/L — ABNORMAL LOW (ref 3.5–5.1)
Sodium: 140 mEq/L (ref 135–145)
Total Bilirubin: 0.4 mg/dL (ref 0.3–1.2)
Total Protein: 6.7 g/dL (ref 6.0–8.3)

## 2013-03-23 LAB — LACTIC ACID, PLASMA: Lactic Acid, Venous: 1.4 mmol/L (ref 0.5–2.2)

## 2013-03-23 MED ORDER — ACETAMINOPHEN 325 MG PO TABS
650.0000 mg | ORAL_TABLET | Freq: Once | ORAL | Status: AC
  Administered 2013-03-23: 650 mg via ORAL
  Filled 2013-03-23: qty 2

## 2013-03-23 NOTE — ED Provider Notes (Signed)
History     CSN: [id number]  Arrival date & time 03/23/13  2121   First MD Initiated Contact with Patient 03/23/13 2121      Chief Complaint  Patient presents with  . Diarrhea    3-4 episodes of brown loose stools; denies n/v nor abd pain; states siter-in-law recently admitted for same symptoms      (Consider location/radiation/quality/duration/timing/severity/associated sxs/prior treatment) Patient is a 79 y.o. female presenting with diarrhea.  Diarrhea Quality:  Unable to specify Severity:  Moderate Onset quality:  Gradual Number of episodes:  Several Timing:  Constant Progression:  Unchanged Relieved by:  Nothing Associated symptoms: chills, diaphoresis and fever   Associated symptoms: no abdominal pain, no recent cough and no vomiting   Risk factors: sick contacts and suspect food intake (ate at a church picnic yesterday)     Past Medical History  Diagnosis Date  . History of colon polyps   . Type II or unspecified type diabetes mellitus without mention of complication, not stated as uncontrolled   . Diverticulitis   . Gout   . HTN (hypertension)   . Osteoarthritis   . Hyperlipidemia   . Pulmonary HTN   . Hydronephrosis     LEFT/ Surgical intervention  . Adrenal adenoma 2006  . GERD (gastroesophageal reflux disease)   . Esophageal spasm 2011  . Boils 2009  . CVA (cerebral infarction) 2010    Cerebellar  . LBP (low back pain)   . Cholelithiasis     asympt. w/normal HIDA 03/2010  . Stress     Past Surgical History  Procedure Laterality Date  . Colectomy  2006    Sigmoid  . Abdominal hysterectomy    . Total knee arthroplasty  2003    LEFT  . Rotator cuff repair  2008    RIGHT  . Breast biopsy      RIGHT  . Cataract extraction, bilateral    . Foot surgery      BILATERAL  . Varicose vein surgery      vein stripping/lower extremities  . Hammer toe surgery    . Back surgery      Family History  Problem Relation Age of Onset  . Diabetes Other     1st degree relative  . Heart disease Other   . Hypertension Other   . Prostate cancer Father   . Hypertension Father   . Cancer Father     prostate  . Prostate cancer Maternal Uncle   . Cancer Maternal Uncle     prostate  . Breast cancer Daughter   . Cancer Daughter     breast  . Colon cancer Neg Hx   . Heart disease Mother   . Diabetes Mother     History  Substance Use Topics  . Smoking status: Never Smoker   . Smokeless tobacco: Not on file  . Alcohol Use: No    OB History   Grav Para Term Preterm Abortions TAB SAB Ect Mult Living                  Review of Systems  Constitutional: Positive for fever, chills and diaphoresis.  HENT: Negative for congestion.   Respiratory: Negative for cough and shortness of breath.   Cardiovascular: Negative for chest pain.  Gastrointestinal: Positive for diarrhea. Negative for nausea, vomiting and abdominal pain.  Genitourinary: Positive for urgency and frequency.  All other systems reviewed and are negative.    Allergies  Aspirin; Atenolol; Codeine sulfate; Hydrochlorothiazide;  Ibuprofen; Lisinopril; Valsartan; and Verapamil  Home Medications   Current Outpatient Rx  Name  Route  Sig  Dispense  Refill  . allopurinol (ZYLOPRIM) 300 MG tablet   Oral   Take 1 tablet (300 mg total) by mouth daily.   90 tablet   3   . amLODipine (NORVASC) 5 MG tablet   Oral   Take 1 tablet (5 mg total) by mouth daily.   90 tablet   3   . aspirin 81 MG EC tablet   Oral   Take 81 mg by mouth daily.           . carvedilol (COREG) 25 MG tablet   Oral   Take 1 tablet (25 mg total) by mouth 2 (two) times daily with a meal.   180 tablet   3   . Cholecalciferol (VITAMIN D3) 2000 UNITS capsule   Oral   Take 2,000 Units by mouth daily.           . Diclofenac Sodium (PENNSAID) 1.5 % SOLN   Transdermal   Place 3-5 drops onto the skin 3 (three) times daily as needed.           . gabapentin (NEURONTIN) 100 MG capsule   Oral    Take 1 capsule (100 mg total) by mouth 3 (three) times daily as needed.   270 capsule   3   . hyoscyamine (LEVSIN SL) 0.125 MG SL tablet   Sublingual   Place 0.125 mg under the tongue every 4 (four) hours as needed. For pain and spasms          . ibuprofen (ADVIL,MOTRIN) 600 MG tablet   Oral   Take 1 tablet (600 mg total) by mouth 2 (two) times daily as needed for pain. Take twice a day x 2 weeks, then prn pain   100 tablet   3   . metFORMIN (GLUCOPHAGE) 850 MG tablet   Oral   Take 1 tablet (850 mg total) by mouth 2 (two) times daily with a meal.   180 tablet   3   . mupirocin (BACTROBAN) 2 % ointment   Topical   Apply topically 2 (two) times daily as needed.   22 g   1   . pantoprazole (PROTONIX) 40 MG tablet   Oral   Take 1 tablet (40 mg total) by mouth daily.   90 tablet   3   . sertraline (ZOLOFT) 100 MG tablet   Oral   Take 1 tablet (100 mg total) by mouth daily.   90 tablet   2   . terconazole (TERAZOL 7) 0.4 % vaginal cream   Vaginal   Place 1 applicator vaginally at bedtime.   45 g   2   . torsemide (DEMADEX) 20 MG tablet   Oral   Take 1 tablet (20 mg total) by mouth daily.   90 tablet   3   . EXPIRED: traMADol (ULTRAM) 50 MG tablet   Oral   Take 25-50 mg by mouth 2 (two) times daily as needed.         . triamcinolone cream (KENALOG) 0.5 %   Topical   Apply topically 2 (two) times daily as needed.   30 g   2     BP 119/53  Pulse 70  Temp(Src) 101.6 F (38.7 C) (Oral)  Resp 16  SpO2 95%  Physical Exam  Nursing note and vitals reviewed. Constitutional: She is oriented to person, place, and time.  She appears well-developed and well-nourished. No distress.  HENT:  Head: Normocephalic and atraumatic.  Mouth/Throat: Oropharynx is clear and moist. Mucous membranes are dry.  Eyes: Conjunctivae are normal. Pupils are equal, round, and reactive to light. No scleral icterus.  Neck: Neck supple.  Cardiovascular: Normal rate, regular rhythm,  normal heart sounds and intact distal pulses.   No murmur heard. Pulmonary/Chest: Effort normal and breath sounds normal. No stridor. No respiratory distress. She has no rales.  Abdominal: Soft. She exhibits no distension. There is no tenderness. There is no rebound and no guarding.  Musculoskeletal: Normal range of motion. She exhibits no edema and no tenderness.  Neurological: She is alert and oriented to person, place, and time.  Skin: Skin is warm and dry. No rash noted.  Psychiatric: She has a normal mood and affect. Her behavior is normal.    ED Course  Procedures (including critical care time)  Labs Reviewed  CBC WITH DIFFERENTIAL - Abnormal; Notable for the following:    WBC 19.7 (*)    Hemoglobin 11.3 (*)    HCT 33.0 (*)    RDW 15.7 (*)    Platelets 126 (*)    Neutrophils Relative 89 (*)    Lymphocytes Relative 5 (*)    Neutro Abs 17.5 (*)    Monocytes Absolute 1.2 (*)    All other components within normal limits  COMPREHENSIVE METABOLIC PANEL - Abnormal; Notable for the following:    Potassium 3.0 (*)    Glucose, Bld 156 (*)    BUN 37 (*)    Creatinine, Ser 1.30 (*)    Albumin 3.3 (*)    GFR calc non Af Amer 38 (*)    GFR calc Af Amer 44 (*)    All other components within normal limits  URINE CULTURE  LACTIC ACID, PLASMA  URINALYSIS, ROUTINE W REFLEX MICROSCOPIC   No results found.   1. Dehydration   2. Diarrhea   3. Acute renal insufficiency       MDM   79 yo female with diarrhea and fevers since yesterday.  No vomiting.  No abdominal pain and abd soft.  Urinary symptoms described on ROS.  She appears weak, but nontoxic.    11:51 PM labs show acute renal insufficiency and leukocytosis.  UA pending.    12:17 AM Consulted Hospitalist (Dr. Conley Rolls) for admission.  UA still pending.         Rennis Petty, MD 03/24/13 [phone]

## 2013-03-23 NOTE — ED Notes (Signed)
Patient asked for urine sample but was not able to urinate. Will call when able.

## 2013-03-24 ENCOUNTER — Encounter (HOSPITAL_COMMUNITY): Payer: Self-pay | Admitting: *Deleted

## 2013-03-24 DIAGNOSIS — R197 Diarrhea, unspecified: Secondary | ICD-10-CM

## 2013-03-24 DIAGNOSIS — A059 Bacterial foodborne intoxication, unspecified: Secondary | ICD-10-CM | POA: Diagnosis present

## 2013-03-24 DIAGNOSIS — N289 Disorder of kidney and ureter, unspecified: Secondary | ICD-10-CM

## 2013-03-24 DIAGNOSIS — E860 Dehydration: Principal | ICD-10-CM

## 2013-03-24 LAB — GLUCOSE, CAPILLARY
Glucose-Capillary: 192 mg/dL — ABNORMAL HIGH (ref 70–99)
Glucose-Capillary: 156 mg/dL — ABNORMAL HIGH (ref 70–99)
Glucose-Capillary: 145 mg/dL — ABNORMAL HIGH (ref 70–99)
Glucose-Capillary: 144 mg/dL — ABNORMAL HIGH (ref 70–99)
Glucose-Capillary: 110 mg/dL — ABNORMAL HIGH (ref 70–99)

## 2013-03-24 LAB — URINALYSIS, ROUTINE W REFLEX MICROSCOPIC
Bilirubin Urine: NEGATIVE
Glucose, UA: NEGATIVE mg/dL
Hgb urine dipstick: NEGATIVE
Ketones, ur: NEGATIVE mg/dL
Nitrite: NEGATIVE
Protein, ur: NEGATIVE mg/dL
Specific Gravity, Urine: 1.015 (ref 1.005–1.030)
Urobilinogen, UA: 0.2 mg/dL (ref 0.0–1.0)
pH: 5 (ref 5.0–8.0)

## 2013-03-24 LAB — URINE MICROSCOPIC-ADD ON

## 2013-03-24 LAB — TSH: TSH: 0.496 u[IU]/mL (ref 0.350–4.500)

## 2013-03-24 MED ORDER — VITAMIN D 1000 UNITS PO TABS
2000.0000 [IU] | ORAL_TABLET | Freq: Every day | ORAL | Status: DC
  Administered 2013-03-24 – 2013-03-25 (×2): 2000 [IU] via ORAL
  Filled 2013-03-24 (×2): qty 2

## 2013-03-24 MED ORDER — ASPIRIN 81 MG PO TBEC: 81.0000 mg | DELAYED_RELEASE_TABLET | Freq: Every day | ORAL | Status: DC

## 2013-03-24 MED ORDER — POTASSIUM CHLORIDE CRYS ER 20 MEQ PO TBCR
40.0000 meq | EXTENDED_RELEASE_TABLET | Freq: Every day | ORAL | Status: DC
  Administered 2013-03-24 – 2013-03-25 (×2): 40 meq via ORAL
  Filled 2013-03-24 (×2): qty 2

## 2013-03-24 MED ORDER — HEPARIN SODIUM (PORCINE) 5000 UNIT/ML IJ SOLN
5000.0000 [IU] | Freq: Three times a day (TID) | INTRAMUSCULAR | Status: DC
  Administered 2013-03-24 – 2013-03-25 (×4): 5000 [IU] via SUBCUTANEOUS
  Filled 2013-03-24 (×7): qty 1

## 2013-03-24 MED ORDER — CARVEDILOL 25 MG PO TABS: 25.0000 mg | ORAL_TABLET | Freq: Two times a day (BID) | ORAL | Status: DC

## 2013-03-24 MED ORDER — ACETAMINOPHEN 325 MG PO TABS
650.0000 mg | ORAL_TABLET | ORAL | Status: DC | PRN
  Administered 2013-03-24 (×2): 650 mg via ORAL
  Filled 2013-03-24 (×2): qty 2

## 2013-03-24 MED ORDER — DEXTROSE 5 % IV SOLN
1.0000 g | INTRAVENOUS | Status: DC
  Administered 2013-03-24 – 2013-03-25 (×2): 1 g via INTRAVENOUS
  Filled 2013-03-24 (×2): qty 10

## 2013-03-24 MED ORDER — CARVEDILOL 25 MG PO TABS
25.0000 mg | ORAL_TABLET | Freq: Two times a day (BID) | ORAL | Status: DC
  Administered 2013-03-24 – 2013-03-25 (×3): 25 mg via ORAL
  Filled 2013-03-24 (×5): qty 1

## 2013-03-24 MED ORDER — INSULIN ASPART 100 UNIT/ML ~~LOC~~ SOLN
0.0000 [IU] | Freq: Three times a day (TID) | SUBCUTANEOUS | Status: DC
  Administered 2013-03-24: 2 [IU] via SUBCUTANEOUS
  Administered 2013-03-24: 1 [IU] via SUBCUTANEOUS

## 2013-03-24 MED ORDER — GABAPENTIN 100 MG PO CAPS
100.0000 mg | ORAL_CAPSULE | Freq: Every day | ORAL | Status: DC | PRN
  Filled 2013-03-24: qty 1

## 2013-03-24 MED ORDER — SERTRALINE HCL 100 MG PO TABS
100.0000 mg | ORAL_TABLET | Freq: Every day | ORAL | Status: DC
  Administered 2013-03-24 – 2013-03-25 (×2): 100 mg via ORAL
  Filled 2013-03-24 (×2): qty 1

## 2013-03-24 MED ORDER — INSULIN ASPART 100 UNIT/ML ~~LOC~~ SOLN
0.0000 [IU] | Freq: Three times a day (TID) | SUBCUTANEOUS | Status: DC
  Administered 2013-03-24: 2 [IU] via SUBCUTANEOUS

## 2013-03-24 MED ORDER — INSULIN ASPART 100 UNIT/ML ~~LOC~~ SOLN: 0.0000 [IU] | Freq: Every day | SUBCUTANEOUS | Status: DC

## 2013-03-24 MED ORDER — VITAMIN B-12 1000 MCG PO TABS
2000.0000 ug | ORAL_TABLET | Freq: Every day | ORAL | Status: DC
  Administered 2013-03-24 – 2013-03-25 (×2): 2000 ug via ORAL
  Filled 2013-03-24 (×2): qty 2

## 2013-03-24 MED ORDER — ONDANSETRON HCL 4 MG PO TABS: 4.0000 mg | ORAL_TABLET | Freq: Four times a day (QID) | ORAL | Status: DC | PRN

## 2013-03-24 MED ORDER — ONDANSETRON HCL 4 MG/2ML IJ SOLN: 4.0000 mg | Freq: Four times a day (QID) | INTRAMUSCULAR | Status: DC | PRN

## 2013-03-24 MED ORDER — ASPIRIN 81 MG PO CHEW
81.0000 mg | CHEWABLE_TABLET | Freq: Every day | ORAL | Status: DC
  Administered 2013-03-24 – 2013-03-25 (×2): 81 mg via ORAL
  Filled 2013-03-24 (×2): qty 1

## 2013-03-24 MED ORDER — PANTOPRAZOLE SODIUM 40 MG PO TBEC
40.0000 mg | DELAYED_RELEASE_TABLET | Freq: Every day | ORAL | Status: DC
  Administered 2013-03-24 – 2013-03-25 (×2): 40 mg via ORAL
  Filled 2013-03-24 (×2): qty 1

## 2013-03-24 MED ORDER — SERTRALINE HCL 100 MG PO TABS: 100.0000 mg | ORAL_TABLET | Freq: Every day | ORAL | Status: DC

## 2013-03-24 MED ORDER — SODIUM CHLORIDE 0.9 % IV SOLN
INTRAVENOUS | Status: DC
  Administered 2013-03-24 (×2): via INTRAVENOUS
  Filled 2013-03-24 (×3): qty 1000

## 2013-03-24 NOTE — Progress Notes (Signed)
  Pt admitted to the unit. Pt is stable, alert and oriented per baseline. Oriented to room, staff, and call bell. Educated to call for any assistance. Bed in lowest position, call bell within reach- will continue to monitor. 

## 2013-03-24 NOTE — Progress Notes (Signed)
[facility] PROGRESS NOTE  Breanna [name] ION:[id number] DOB: 1931-12-20 DOA: 03/23/2013 PCP: Sonda Primes, MD  Assessment/Plan:  Fever Patient with fever up to 101.6 this morning Suspect viral etiology Blood cultures drawn - patient already on Rocephin Urine is not a clean catch, but does not appear infected.  Culture pending Chest sounds clear and she is without cough. Treated supportively with tylenol  Diarrhea Resolved.  No bowel movement since yesterday am.  Hypokalemia Will supplement and check a bmet in the am.  DM2 On Metformin at home. On SSI-S in house.  CBGs 145 - 192. Hgb A1C on 12/22/12 = 7.0   DVT Prophylaxis:  Heparin  Code Status: full code Family Communication:  Disposition Plan: to home likely 03/25/13 if afebrile.   Antibiotics:  Rocephin  HPI/Subjective: Requesting tylenol for her fever and complaining the the IV hurting her hand.  No other complaints.  Objective: Filed Vitals:   03/24/13 0100 03/24/13 0115 03/24/13 0152 03/24/13 0604  BP: 114/46 105/48 123/68 137/69  Pulse:  62 65 76  Temp:   99 F (37.2 C) 100.2 F (37.9 C)  TempSrc:   Oral Oral  Resp:  26 18 18   Height:   5' 4.8" (1.646 m)   Weight:   70.4 kg (155 lb 3.3 oz)   SpO2:  96% 95% 97%    Intake/Output Summary (Last 24 hours) at 03/24/13 0914 Last data filed at 03/24/13 0538  Gross per 24 hour  Intake      0 ml  Output    200 ml  Net   -200 ml   Filed Weights   03/24/13 0152  Weight: 70.4 kg (155 lb 3.3 oz)    Exam:   General:  A&O, NAD, Lying comfortably in bed, extremely pleasant.  Cardiovascular: RRR, no murmurs, rubs or gallops, no lower extremity edema  Respiratory: CTA, no wheeze, crackles, or rales.  No increased work of breathing.  Abdomen: Soft, non-tender, non-distended, + bowel sounds, no masses  Musculoskeletal: Able to move all 4 extremities, 5/5 strength in each  Data Reviewed: Basic Metabolic Panel:  Recent Labs Lab 03/23/13 2200   NA 140  K 3.0*  CL 101  CO2 26  GLUCOSE 156*  BUN 37*  CREATININE 1.30*  CALCIUM 9.1   Liver Function Tests:  Recent Labs Lab 03/23/13 2200  AST 34  ALT 24  ALKPHOS 89  BILITOT 0.4  PROT 6.7  ALBUMIN 3.3*   CBC:  Recent Labs Lab 03/23/13 2200  WBC 19.7*  NEUTROABS 17.5*  HGB 11.3*  HCT 33.0*  MCV 81.7  PLT 126*   CBG:  Recent Labs Lab 03/24/13 0158 03/24/13 0750  GLUCAP 192* 156*      Studies: No results found.  Scheduled Meds: . aspirin  81 mg Oral Daily  . carvedilol  25 mg Oral BID WC  . cefTRIAXone (ROCEPHIN)  IV  1 g Intravenous Q24H  . cholecalciferol  2,000 Units Oral Daily  . heparin  5,000 Units Subcutaneous Q8H  . insulin aspart  0-5 Units Subcutaneous QHS  . insulin aspart  0-9 Units Subcutaneous TID WC  . pantoprazole  40 mg Oral Daily  . potassium chloride  40 mEq Oral Daily  . sertraline  100 mg Oral Daily  . cyanocobalamin  2,000 mcg Oral Daily   Continuous Infusions: . sodium chloride 0.9 % 1,000 mL with potassium chloride 40 mEq infusion 75 mL/hr at 03/24/13 0203    Principal Problem:   Dehydration, moderate  Active Problems:   DIABETES MELLITUS, TYPE II   HYPERTENSION   Food poisoning    Conley Canal [facility] Pager [phone]. If 7PM-7AM, please contact night-coverage at [email or web address], password [facility] 03/24/2013, 9:14 AM  LOS: 1 day   Attending Patient seen and examined, fever this is am-but clinically improved-without any diarrhea. Agree with the assessment and plan.  S Ketty Bitton

## 2013-03-24 NOTE — H&P (Signed)
[facility] History and Physical  Breanna [name] ION:[id number] DOB: 04/11/1931    PCP:   Sonda Primes, MD   Chief Complaint: weakness and diarrhea.  HPI: Breanna [name] is an 79 y.o. female with hx of HTN on diuretics, DM2, gout, pulm HTN, lives at home with her husband, presents to the ER with 2 days history of watery diarrhea, nausea, but no vomiting.  She also has frequent urination, but no dysuria.  Her husband states that there were another family member with GI symptoms after attending a church dinner.  Evaluation in the ER showed leukocytosis with WBC of 19.7K, K 3.0, Cr 1.3, Bun 37, and BS 156.  She was not able to produce UA at the time of admission.  Hospitalist was asked to admit her for dehydration and possible UTI  Rewiew of Systems:  Constitutional: Negative for malaise, fever and chills. No significant weight loss or weight gain Eyes: Negative for eye pain, redness and discharge, diplopia, visual changes, or flashes of light. ENMT: Negative for ear pain, hoarseness, nasal congestion, sinus pressure and sore throat. No headaches; tinnitus, drooling, or problem swallowing. Cardiovascular: Negative for chest pain, palpitations, diaphoresis, dyspnea and peripheral edema. ; No orthopnea, PND Respiratory: Negative for cough, hemoptysis, wheezing and stridor. No pleuritic chestpain. Gastrointestinal: Negative for constipation, abdominal pain, melena, blood in stool, hematemesis, jaundice and rectal bleeding.    Genitourinary: Negative for  dysuria, incontinence,flank pain and hematuria; Musculoskeletal: Negative for back pain and neck pain. Negative for swelling and trauma.;  Skin: . Negative for pruritus, rash, abrasions, bruising and skin lesion.; ulcerations Neuro: Negative for headache, lightheadedness and neck stiffness. Negative for weakness, altered level of consciousness , altered mental status, extremity weakness, burning feet, involuntary movement, seizure and syncope.   Psych: negative for anxiety, depression, insomnia, tearfulness, panic attacks, hallucinations, paranoia, suicidal or homicidal ideation    Past Medical History  Diagnosis Date  . History of colon polyps   . Type II or unspecified type diabetes mellitus without mention of complication, not stated as uncontrolled   . Diverticulitis   . Gout   . HTN (hypertension)   . Osteoarthritis   . Hyperlipidemia   . Pulmonary HTN   . Hydronephrosis     LEFT/ Surgical intervention  . Adrenal adenoma 2006  . GERD (gastroesophageal reflux disease)   . Esophageal spasm 2011  . Boils 2009  . CVA (cerebral infarction) 2010    Cerebellar  . LBP (low back pain)   . Cholelithiasis     asympt. w/normal HIDA 03/2010  . Stress     Past Surgical History  Procedure Laterality Date  . Colectomy  2006    Sigmoid  . Abdominal hysterectomy    . Total knee arthroplasty  2003    LEFT  . Rotator cuff repair  2008    RIGHT  . Breast biopsy      RIGHT  . Cataract extraction, bilateral    . Foot surgery      BILATERAL  . Varicose vein surgery      vein stripping/lower extremities  . Hammer toe surgery    . Back surgery      Medications:  HOME MEDS: Prior to Admission medications   Medication Sig Start Date End Date Taking? Authorizing Provider  allopurinol (ZYLOPRIM) 300 MG tablet Take 300 mg by mouth daily. 02/13/13  Yes Georgina Quint Plotnikov, MD  amLODipine (NORVASC) 5 MG tablet Take 5 mg by mouth daily. 02/13/13  Yes Georgina Quint  Plotnikov, MD  aspirin 81 MG EC tablet Take 81 mg by mouth daily.     Yes Historical Provider, MD  carvedilol (COREG) 25 MG tablet Take 25 mg by mouth 2 (two) times daily with a meal. 02/13/13  Yes Tresa Garter, MD  Cholecalciferol (VITAMIN D3) 2000 UNITS capsule Take 2,000 Units by mouth daily.     Yes Historical Provider, MD  cyanocobalamin 2000 MCG tablet Take 2,000 mcg by mouth daily.   Yes Historical Provider, MD  gabapentin (NEURONTIN) 100 MG capsule Take 100 mg  by mouth daily as needed (for nerve pain). 02/13/13 02/13/14 Yes Georgina Quint Plotnikov, MD  ibuprofen (ADVIL,MOTRIN) 600 MG tablet Take 600 mg by mouth 2 (two) times daily as needed for pain. 12/31/11  Yes Tresa Garter, MD  metFORMIN (GLUCOPHAGE) 850 MG tablet Take 850 mg by mouth 2 (two) times daily with a meal. 02/13/13  Yes Georgina Quint Plotnikov, MD  pantoprazole (PROTONIX) 40 MG tablet Take 40 mg by mouth daily. 02/13/13  Yes Georgina Quint Plotnikov, MD  sertraline (ZOLOFT) 100 MG tablet Take 100 mg by mouth daily. 01/22/13  Yes Georgina Quint Plotnikov, MD  torsemide (DEMADEX) 20 MG tablet Take 20 mg by mouth daily. 02/13/13  Yes Tresa Garter, MD     Allergies:  Allergies  Allergen Reactions  . Aspirin   . Atenolol     REACTION: fatigue  . Codeine Sulfate   . Hydrochlorothiazide     REACTION: gout  . Ibuprofen   . Lisinopril     REACTION: cough  . Valsartan   . Verapamil     REACTION: SOB    Social History:   reports that she has never smoked. She does not have any smokeless tobacco history on file. She reports that she does not drink alcohol or use illicit drugs.  Family History: Family History  Problem Relation Age of Onset  . Diabetes Other     1st degree relative  . Heart disease Other   . Hypertension Other   . Prostate cancer Father   . Hypertension Father   . Cancer Father     prostate  . Prostate cancer Maternal Uncle   . Cancer Maternal Uncle     prostate  . Breast cancer Daughter   . Cancer Daughter     breast  . Colon cancer Neg Hx   . Heart disease Mother   . Diabetes Mother      Physical Exam: Filed Vitals:   03/23/13 2128 03/23/13 2237  BP: 119/53 93/62  Pulse: 70   Temp: 101.6 F (38.7 C)   TempSrc: Oral   Resp: 16 14  SpO2: 95% 99%   Blood pressure 93/62, pulse 70, temperature 101.6 F (38.7 C), temperature source Oral, resp. rate 14, SpO2 99.00%.  GEN:  Pleasant  patient lying in the stretcher in no acute distress; cooperative with  exam. PSYCH:  alert and oriented x4; does not appear anxious or depressed; affect is appropriate. HEENT: Mucous membranes pink and anicteric; PERRLA; EOM intact; no cervical lymphadenopathy nor thyromegaly or carotid bruit; no JVD; There were no stridor. Neck is very supple. Breasts:: Not examined CHEST WALL: No tenderness CHEST: Normal respiration, clear to auscultation bilaterally.  HEART: Regular rate and rhythm.  There are no murmur, rub, or gallops.   BACK: No kyphosis or scoliosis; no CVA tenderness ABDOMEN: soft and non-tender; no masses, no organomegaly, normal abdominal bowel sounds; no pannus; no intertriginous candida. There is no rebound and  no distention. Rectal Exam: Not done EXTREMITIES: No bone or joint deformity; age-appropriate arthropathy of the hands and knees; no edema; no ulcerations.  There is no calf tenderness. Genitalia: not examined PULSES: 2+ and symmetric SKIN: Normal hydration no rash or ulceration CNS: Cranial nerves 2-12 grossly intact no focal lateralizing neurologic deficit.  Speech is fluent; uvula elevated with phonation, facial symmetry and tongue midline. DTR are normal bilaterally, cerebella exam is intact, barbinski is negative and strengths are equaled bilaterally.  No sensory loss.   Labs on Admission:  Basic Metabolic Panel:  Recent Labs Lab 03/23/13 2200  NA 140  K 3.0*  CL 101  CO2 26  GLUCOSE 156*  BUN 37*  CREATININE 1.30*  CALCIUM 9.1   Liver Function Tests:  Recent Labs Lab 03/23/13 2200  AST 34  ALT 24  ALKPHOS 89  BILITOT 0.4  PROT 6.7  ALBUMIN 3.3*   No results found for this basename: LIPASE, AMYLASE,  in the last 168 hours No results found for this basename: AMMONIA,  in the last 168 hours CBC:  Recent Labs Lab 03/23/13 2200  WBC 19.7*  NEUTROABS 17.5*  HGB 11.3*  HCT 33.0*  MCV 81.7  PLT 126*   Cardiac Enzymes: No results found for this basename: CKTOTAL, CKMB, CKMBINDEX, TROPONINI,  in the last 168  hours  CBG: No results found for this basename: GLUCAP,  in the last 168 hours   Radiological Exams on Admission: No results found.   Assessment/Plan Present on Admission:  . Dehydration, moderate . Food poisoning . HYPERTENSION . DIABETES MELLITUS, TYPE II Hypokalemia.  PLAN:  Will admit her for moderate dehydration because of diarrhea and diuretics.  I will give her IVF with K replacement.  Will hold her diuretics (Demadex and Furosemide).  If she continues not able to void, will do I/O catherization.  In the interim, I will go ahead and start her on IV Rocephin.  I suspect she has food poisoning given the history.  Will give carb modified diet given her DM2.  I will hold her Norvasc because she has a borderline BP tonight.  For her low K, will give daily K supplements.  She is stable, full code, and will be admitted to [facility] service.  Thank you for allowing me to partake in the care of your nice patient.  Other plans as per orders.  Code Status: FULL Unk Lightning, MD. [facility] Pager [phone] 7pm to 7am.  03/24/2013, 1:20 AM

## 2013-03-24 NOTE — ED Provider Notes (Signed)
I saw and evaluated the patient, reviewed the resident's note and I agree with the findings and plan.  Pt with soft abd, increased BS, appears dehydrated, low grade fever with diarrheal illness.  Admit for hydration  Vida Roller, MD 03/24/13 [phone]

## 2013-03-24 NOTE — Care Management Note (Signed)
    Page 1 of 1   03/25/2013     1:26:52 PM   CARE MANAGEMENT NOTE 03/25/2013  Patient:  EVOLETH, NORDMEYER   Account Number:  [id number]  Date Initiated:  03/24/2013  Documentation initiated by:  Letha Cape  Subjective/Objective Assessment:   dx dehydration, fever, diarrhea  admit - lives with spouse, pta indep.     Action/Plan:   pt eval   Anticipated DC Date:  03/25/2013   Anticipated DC Plan:  HOME/SELF CARE      DC Planning Services  CM consult      Choice offered to / List presented to:             Status of service:  Completed, signed off Medicare Important Message given?   (If response is "NO", the following Medicare IM given date fields will be blank) Date Medicare IM given:   Date Additional Medicare IM given:    Discharge Disposition:  HOME/SELF CARE  Per UR Regulation:  Reviewed for med. necessity/level of care/duration of stay  If discussed at Long Length of Stay Meetings, dates discussed:    Comments:  03/25/13 13:26 Letha Cape RN, [id number] [phone] patient dc to home today, no needs anticipated.  03/24/13 17:01 Letha Cape RN, BSN [phone] patient lives with spouse, pta indep. await pt eval.

## 2013-03-25 DIAGNOSIS — A084 Viral intestinal infection, unspecified: Secondary | ICD-10-CM | POA: Diagnosis present

## 2013-03-25 DIAGNOSIS — R197 Diarrhea, unspecified: Secondary | ICD-10-CM

## 2013-03-25 DIAGNOSIS — E860 Dehydration: Secondary | ICD-10-CM

## 2013-03-25 LAB — URINE CULTURE: Colony Count: 9000

## 2013-03-25 LAB — GLUCOSE, CAPILLARY
Glucose-Capillary: 110 mg/dL — ABNORMAL HIGH (ref 70–99)
Glucose-Capillary: 146 mg/dL — ABNORMAL HIGH (ref 70–99)

## 2013-03-25 LAB — CBC
HCT: 28.5 % — ABNORMAL LOW (ref 36.0–46.0)
Hemoglobin: 9.8 g/dL — ABNORMAL LOW (ref 12.0–15.0)
MCH: 28.2 pg (ref 26.0–34.0)
MCHC: 34.4 g/dL (ref 30.0–36.0)
MCV: 81.9 fL (ref 78.0–100.0)
Platelets: 120 10*3/uL — ABNORMAL LOW (ref 150–400)
RBC: 3.48 MIL/uL — ABNORMAL LOW (ref 3.87–5.11)
RDW: 16.5 % — ABNORMAL HIGH (ref 11.5–15.5)
WBC: 15 10*3/uL — ABNORMAL HIGH (ref 4.0–10.5)

## 2013-03-25 LAB — BASIC METABOLIC PANEL
BUN: 25 mg/dL — ABNORMAL HIGH (ref 6–23)
CO2: 23 mEq/L (ref 19–32)
Calcium: 8.4 mg/dL (ref 8.4–10.5)
Chloride: 108 mEq/L (ref 96–112)
Creatinine, Ser: 0.89 mg/dL (ref 0.50–1.10)
GFR calc Af Amer: 70 mL/min — ABNORMAL LOW (ref >90)
GFR calc non Af Amer: 60 mL/min — ABNORMAL LOW (ref >90)
Glucose, Bld: 104 mg/dL — ABNORMAL HIGH (ref 70–99)
Potassium: 3.8 mEq/L (ref 3.5–5.1)
Sodium: 139 mEq/L (ref 135–145)

## 2013-03-25 MED ORDER — TORSEMIDE 20 MG PO TABS: 10.0000 mg | ORAL_TABLET | Freq: Every day | ORAL | Status: DC

## 2013-03-25 NOTE — Progress Notes (Signed)
Shawn Route to be D/C'd Home per MD order.  Discussed with the patient and all questions fully answered.    Medication List    STOP taking these medications       amLODipine 5 MG tablet  Commonly known as:  NORVASC      TAKE these medications       allopurinol 300 MG tablet  Commonly known as:  ZYLOPRIM  Take 300 mg by mouth daily.     aspirin 81 MG EC tablet  Take 81 mg by mouth daily.     carvedilol 25 MG tablet  Commonly known as:  COREG  Take 25 mg by mouth 2 (two) times daily with a meal.     cyanocobalamin 2000 MCG tablet  Take 2,000 mcg by mouth daily.     gabapentin 100 MG capsule  Commonly known as:  NEURONTIN  Take 100 mg by mouth daily as needed (for nerve pain).     ibuprofen 600 MG tablet  Commonly known as:  ADVIL,MOTRIN  Take 600 mg by mouth 2 (two) times daily as needed for pain.     metFORMIN 850 MG tablet  Commonly known as:  GLUCOPHAGE  Take 850 mg by mouth 2 (two) times daily with a meal.     pantoprazole 40 MG tablet  Commonly known as:  PROTONIX  Take 40 mg by mouth daily.     sertraline 100 MG tablet  Commonly known as:  ZOLOFT  Take 100 mg by mouth daily.     torsemide 20 MG tablet  Commonly known as:  DEMADEX  Take 0.5 tablets (10 mg total) by mouth daily.     Vitamin D3 2000 UNITS capsule  Take 2,000 Units by mouth daily.        VVS, Skin clean, dry and intact without evidence of skin break down, no evidence of skin tears noted. IV catheter discontinued intact. Site without signs and symptoms of complications. Dressing and pressure applied.  An After Visit Summary was printed and given to the patient. Patient escorted via WC, and D/C home via private auto.  Kennyth Arnold D 03/25/2013 10:40 AM

## 2013-03-25 NOTE — Discharge Summary (Signed)
Physician Discharge Summary  Breanna [name] ZOX:[id number] DOB: 1931-05-15 DOA: 03/23/2013  PCP: Sonda Primes, MD  Admit date: 03/23/2013 Discharge date: 03/25/2013  Time spent: 40 minutes  Recommendations for Outpatient Follow-up:  PCP follow up in 1 week.  Please monitor volume status and BP.  Her amlodipine was discontinued and demedex cut in half during this admission.  Discharge Diagnoses:  Principal Problem:   Dehydration, moderate Active Problems:   DIABETES MELLITUS, TYPE II   HYPERTENSION   Food poisoning   Viral intestinal infection   Discharge Condition: Stable, much improved.  Tolerating diet.  Diarrhea resolved.  Diet recommendation: heart healthy, carb modified  Filed Weights   03/24/13 0152  Weight: 70.4 kg (155 lb 3.3 oz)    History of present illness at the time of admission:  Breanna [name] is an 79 y.o. female with hx of HTN on diuretics, DM2, gout, pulm HTN, lives at home with her husband, presents to the ER with 2 days history of watery diarrhea, nausea, but no vomiting. She also has frequent urination, but no dysuria. Her husband states that there were another family member with GI symptoms after attending a church dinner. Evaluation in the ER showed leukocytosis with WBC of 19.7K, K 3.0, Cr 1.3, Bun 37, and BS 156. She was not able to produce UA at the time of admission. Hospitalist was asked to admit her for dehydration and possible UTI   Hospital Course:   Fever  Patient with fever up to 101.6 on the morning of 4/1 it has since resolved..  Suspect viral etiology  Blood cultures were drawn and show no growth to date at the time of discharge.  Urine is not a clean catch, but does not appear infected. Culture negative.  Chest sounds clear and she is without cough.  Treated supportively with tylenol.  Dehydration Likely secondary to viral illness with GI losses in the setting of diuretic use.  Resolved with holding diuretic therapy and giving IVF. Patient appears  euvolemic at discharge.  Will decrease dose of demedex to 10 mg at discharge.  Request PCP follow volume status.  Diarrhea  Resolved. No bowel movement since 3/31.  Patient tolerating solid diet well.   Hypertension /Hypotension Patient who is normally hypertensive had soft BP thru out her hospital stay.  Her amlodipine has been discontinued, but she remains on her Coreg BID.  Diuretics have been decreased as well.  These will likely need to be adjusted in the out patient setting as she continues to improve.  Hypokalemia  Secondary to diarrhea and dehydration.  Resolved with supplementation  DM2  On Metformin at home.  Will resume at discharge.  Hgb A1C on 12/22/12 = 7.0    Discharge Exam: Filed Vitals:   03/24/13 1803 03/24/13 1843 03/24/13 2028 03/25/13 0500  BP: 149/76  129/65 114/67  Pulse:   65 57  Temp:  99.8 F (37.7 C) 99.6 F (37.6 C) 98.8 F (37.1 C)  TempSrc:   Oral Oral  Resp:   18 18  Height:      Weight:      SpO2:   97% 97%    General: A&O, NAD, Extremely pleasant Cardiovascular: RRR, slight systolic murmur, no rub or gallop Respiratory: CTA no w/c/r, no accessory muscle use Abdomen:  Soft, NT, ND, +BS, No masses Extremities:  5/5 strength in each, no lower extremity edema.  Discharge Instructions      Discharge Orders   Future Appointments Provider Department Dept Phone  04/29/2013 8:45 AM Tresa Garter, MD [facility] [phone]   Future Orders Complete By Expires     Diet - low sodium heart healthy  As directed     Discharge instructions  As directed     Comments:      Please monitor volume status.  Diuretics reduced in the hospital.  Also Amlodipine discontinued.    Increase activity slowly  As directed         Medication List    STOP taking these medications       amLODipine 5 MG tablet  Commonly known as:  NORVASC      TAKE these medications       allopurinol 300 MG tablet  Commonly known as:   ZYLOPRIM  Take 300 mg by mouth daily.     aspirin 81 MG EC tablet  Take 81 mg by mouth daily.     carvedilol 25 MG tablet  Commonly known as:  COREG  Take 25 mg by mouth 2 (two) times daily with a meal.     cyanocobalamin 2000 MCG tablet  Take 2,000 mcg by mouth daily.     gabapentin 100 MG capsule  Commonly known as:  NEURONTIN  Take 100 mg by mouth daily as needed (for nerve pain).     ibuprofen 600 MG tablet  Commonly known as:  ADVIL,MOTRIN  Take 600 mg by mouth 2 (two) times daily as needed for pain.     metFORMIN 850 MG tablet  Commonly known as:  GLUCOPHAGE  Take 850 mg by mouth 2 (two) times daily with a meal.     pantoprazole 40 MG tablet  Commonly known as:  PROTONIX  Take 40 mg by mouth daily.     sertraline 100 MG tablet  Commonly known as:  ZOLOFT  Take 100 mg by mouth daily.     torsemide 20 MG tablet  Commonly known as:  DEMADEX  Take 0.5 tablets (10 mg total) by mouth daily.     Vitamin D3 2000 UNITS capsule  Take 2,000 Units by mouth daily.       Follow-up Information   Follow up with Sonda Primes, MD. Schedule an appointment as soon as possible for a visit in 1 week.   Contact information:   [address] [place] [address] [phone]        The results of significant diagnostics from this hospitalization (including imaging, microbiology, ancillary and laboratory) are listed below for reference.    Significant Diagnostic Studies: No results found.  Microbiology: Recent Results (from the past 240 hour(s))  URINE CULTURE     Status: None   Collection Time    03/24/13 12:59 AM      Result Value Range Status   Specimen Description URINE, CLEAN CATCH   Final   Special Requests NONE   Final   Culture  Setup Time 03/24/2013 01:16   Final   Colony Count 9,000 COLONIES/ML   Final   Culture INSIGNIFICANT GROWTH   Final   Report Status 03/25/2013 FINAL   Final  CULTURE, BLOOD (ROUTINE X 2)     Status: None    Collection Time    03/24/13  8:38 AM      Result Value Range Status   Specimen Description BLOOD RIGHT ARM   Final   Special Requests     Final   Value: BOTTLES DRAWN AEROBIC AND ANAEROBIC 10CC  PT  ON DOXY   Culture  Setup Time 03/24/2013 15:32   Final   Culture     Final   Value:        BLOOD CULTURE RECEIVED NO GROWTH TO DATE CULTURE WILL BE HELD FOR 5 DAYS BEFORE ISSUING A FINAL NEGATIVE REPORT   Report Status PENDING   Incomplete  CULTURE, BLOOD (ROUTINE X 2)     Status: None   Collection Time    03/24/13  8:44 AM      Result Value Range Status   Specimen Description BLOOD RIGHT HAND   Final   Special Requests     Final   Value: BOTTLES DRAWN AEROBIC AND ANAEROBIC 10CC  PT ON DOXY   Culture  Setup Time 03/24/2013 15:32   Final   Culture     Final   Value:        BLOOD CULTURE RECEIVED NO GROWTH TO DATE CULTURE WILL BE HELD FOR 5 DAYS BEFORE ISSUING A FINAL NEGATIVE REPORT   Report Status PENDING   Incomplete     Labs: Basic Metabolic Panel:  Recent Labs Lab 03/23/13 2200 03/25/13 0435  NA 140 139  K 3.0* 3.8  CL 101 108  CO2 26 23  GLUCOSE 156* 104*  BUN 37* 25*  CREATININE 1.30* 0.89  CALCIUM 9.1 8.4   Liver Function Tests:  Recent Labs Lab 03/23/13 2200  AST 34  ALT 24  ALKPHOS 89  BILITOT 0.4  PROT 6.7  ALBUMIN 3.3*   CBC:  Recent Labs Lab 03/23/13 2200 03/25/13 0435  WBC 19.7* 15.0*  NEUTROABS 17.5*  --   HGB 11.3* 9.8*  HCT 33.0* 28.5*  MCV 81.7 81.9  PLT 126* 120*   CBG:  Recent Labs Lab 03/24/13 0750 03/24/13 1157 03/24/13 1700 03/24/13 2102 03/25/13 0756  GLUCAP 156* 145* 144* 110* 110*       Signed:  Stephani Police, PA-C [facility] 03/25/2013, 10:22 AM

## 2013-03-30 LAB — CULTURE, BLOOD (ROUTINE X 2)
Culture: NO GROWTH
Culture: NO GROWTH

## 2013-03-31 ENCOUNTER — Encounter: Payer: Self-pay | Admitting: Internal Medicine

## 2013-03-31 ENCOUNTER — Other Ambulatory Visit (INDEPENDENT_AMBULATORY_CARE_PROVIDER_SITE_OTHER): Payer: 59

## 2013-03-31 ENCOUNTER — Telehealth: Payer: Self-pay | Admitting: Internal Medicine

## 2013-03-31 ENCOUNTER — Ambulatory Visit (INDEPENDENT_AMBULATORY_CARE_PROVIDER_SITE_OTHER): Payer: 59 | Admitting: Internal Medicine

## 2013-03-31 VITALS — BP 138/70 | HR 76 | Temp 98.3°F | Resp 16 | Wt 153.0 lb

## 2013-03-31 DIAGNOSIS — E119 Type 2 diabetes mellitus without complications: Secondary | ICD-10-CM

## 2013-03-31 DIAGNOSIS — R634 Abnormal weight loss: Secondary | ICD-10-CM

## 2013-03-31 DIAGNOSIS — I6350 Cerebral infarction due to unspecified occlusion or stenosis of unspecified cerebral artery: Secondary | ICD-10-CM

## 2013-03-31 DIAGNOSIS — A084 Viral intestinal infection, unspecified: Secondary | ICD-10-CM

## 2013-03-31 DIAGNOSIS — M545 Low back pain, unspecified: Secondary | ICD-10-CM

## 2013-03-31 DIAGNOSIS — A088 Other specified intestinal infections: Secondary | ICD-10-CM

## 2013-03-31 DIAGNOSIS — I10 Essential (primary) hypertension: Secondary | ICD-10-CM

## 2013-03-31 LAB — CBC WITH DIFFERENTIAL/PLATELET
Basophils Absolute: 0 10*3/uL (ref 0.0–0.1)
Basophils Relative: 0 % (ref 0.0–3.0)
Eosinophils Absolute: 0.2 10*3/uL (ref 0.0–0.7)
Eosinophils Relative: 4.4 % (ref 0.0–5.0)
HCT: 34.7 % — ABNORMAL LOW (ref 36.0–46.0)
Hemoglobin: 11.3 g/dL — ABNORMAL LOW (ref 12.0–15.0)
Lymphocytes Relative: 21.4 % (ref 12.0–46.0)
Lymphs Abs: 1.2 10*3/uL (ref 0.7–4.0)
MCHC: 32.5 g/dL (ref 30.0–36.0)
MCV: 86.9 fl (ref 78.0–100.0)
Monocytes Absolute: 0.5 10*3/uL (ref 0.1–1.0)
Monocytes Relative: 9.7 % (ref 3.0–12.0)
Neutro Abs: 3.6 10*3/uL (ref 1.4–7.7)
Neutrophils Relative %: 64.5 % (ref 43.0–77.0)
Platelets: 249 10*3/uL (ref 150.0–400.0)
RBC: 4 Mil/uL (ref 3.87–5.11)
RDW: 17.3 % — ABNORMAL HIGH (ref 11.5–14.6)
WBC: 5.5 10*3/uL (ref 4.5–10.5)

## 2013-03-31 LAB — BASIC METABOLIC PANEL
BUN: 21 mg/dL (ref 6–23)
CO2: 29 mEq/L (ref 19–32)
Calcium: 9.4 mg/dL (ref 8.4–10.5)
Chloride: 104 mEq/L (ref 96–112)
Creatinine, Ser: 0.9 mg/dL (ref 0.4–1.2)
GFR: 73.76 mL/min (ref >60.00)
Glucose, Bld: 107 mg/dL — ABNORMAL HIGH (ref 70–99)
Potassium: 4.1 mEq/L (ref 3.5–5.1)
Sodium: 143 mEq/L (ref 135–145)

## 2013-03-31 LAB — HEMOGLOBIN A1C: Hgb A1c MFr Bld: 6.5 % (ref 4.6–6.5)

## 2013-03-31 MED ORDER — TORSEMIDE 20 MG PO TABS: 10.0000 mg | ORAL_TABLET | Freq: Every day | ORAL | Status: DC | PRN

## 2013-03-31 MED ORDER — ACYCLOVIR 400 MG PO TABS: 400.0000 mg | ORAL_TABLET | Freq: Three times a day (TID) | ORAL | Status: DC

## 2013-03-31 NOTE — Assessment & Plan Note (Signed)
Better since she has been resting

## 2013-03-31 NOTE — Assessment & Plan Note (Signed)
Continue with current prescription therapy as reflected on the Med list.  

## 2013-03-31 NOTE — Assessment & Plan Note (Signed)
Resolved

## 2013-03-31 NOTE — Assessment & Plan Note (Signed)
Continue with current prescription therapy as reflected on the Med list. No relapse 

## 2013-03-31 NOTE — Assessment & Plan Note (Signed)
Watching wts - fluctuating

## 2013-03-31 NOTE — Telephone Encounter (Signed)
Stacey, please, inform patient that all labs are ok Thx 

## 2013-03-31 NOTE — Progress Notes (Signed)
   Subjective:    HPI  F/u hosp stay (d/c'd on 4/2):  Discharge Diagnoses:  Principal Problem:  Dehydration, moderate  Active Problems:  DIABETES MELLITUS, TYPE II  HYPERTENSION  Food poisoning  Viral intestinal infection      F/u fatigue, chills, lack of appetite since 10/13. She was getting better before the above illness. C/o stress at home  The patient presents for a follow-up of  chronic hypertension, chronic dyslipidemia, type 2 diabetes controlled with medicines F/u on R knee pain and LBP, OA.    F/u - B leg tingling and pain x few months, better   Wt Readings from Last 3 Encounters:  03/31/13 153 lb (69.4 kg)  03/24/13 155 lb 3.3 oz (70.4 kg)  01/22/13 162 lb (73.483 kg)   BP Readings from Last 3 Encounters:  03/31/13 138/70  03/25/13 114/67  01/22/13 140/80      Review of Systems  Constitutional: Negative for chills, activity change, appetite change, fatigue and unexpected weight change.  HENT: Negative for congestion, mouth sores and sinus pressure.   Eyes: Negative for visual disturbance.  Respiratory: Negative for chest tightness.   Genitourinary: Negative for frequency, difficulty urinating and vaginal pain.  Musculoskeletal: Positive for back pain, arthralgias and gait problem.  Skin: Negative for pallor.  Neurological: Negative for dizziness, tremors and weakness.  Psychiatric/Behavioral: Negative for suicidal ideas, confusion and sleep disturbance. The patient is nervous/anxious.        Objective:   Physical Exam  Constitutional: She appears well-developed. No distress.  HENT:  Head: Normocephalic.  Left Ear: External ear normal.  Nose: Nose normal.  Mouth/Throat: Oropharynx is clear and moist.  Eyes: Conjunctivae are normal. Pupils are equal, round, and reactive to light. Right eye exhibits no discharge. Left eye exhibits no discharge.  Neck: Normal range of motion. Neck supple. No JVD present. No tracheal deviation present. No  thyromegaly present.  Cardiovascular: Normal rate, regular rhythm and normal heart sounds.   Pulmonary/Chest: No stridor. No respiratory distress. She has no wheezes.  Abdominal: Soft. Bowel sounds are normal. She exhibits no distension and no mass. There is no tenderness. There is no rebound and no guarding.  Musculoskeletal: She exhibits tenderness (LS spine is tender w/ROM). She exhibits no edema.  R wrist and R ankle are tender  Lymphadenopathy:    She has no cervical adenopathy.  Neurological: She displays normal reflexes. No cranial nerve deficit. She exhibits normal muscle tone. Coordination normal.  Skin: No rash noted. No erythema.  Psychiatric: She has a normal mood and affect. Her behavior is normal. Judgment and thought content normal.     Lab Results  Component Value Date   WBC 15.0* 03/25/2013   HGB 9.8* 03/25/2013   HCT 28.5* 03/25/2013   PLT 120* 03/25/2013   GLUCOSE 104* 03/25/2013   CHOL 148 04/03/2011   TRIG 86.0 04/03/2011   HDL 47.30 04/03/2011   LDLCALC 84 04/03/2011   ALT 24 03/23/2013   AST 34 03/23/2013   NA 139 03/25/2013   K 3.8 03/25/2013   CL 108 03/25/2013   CREATININE 0.89 03/25/2013   BUN 25* 03/25/2013   CO2 23 03/25/2013   TSH 0.496 03/24/2013   HGBA1C 7.0* 12/22/2012             Assessment & Plan:

## 2013-03-31 NOTE — Patient Instructions (Addendum)
Take Torsemide as needed for fluid retention

## 2013-04-01 NOTE — Telephone Encounter (Signed)
Pt informed

## 2013-04-23 ENCOUNTER — Ambulatory Visit: Payer: 59 | Admitting: Internal Medicine

## 2013-04-29 ENCOUNTER — Ambulatory Visit: Payer: 59 | Admitting: Internal Medicine

## 2013-05-27 ENCOUNTER — Telehealth: Payer: Self-pay | Admitting: *Deleted

## 2013-05-27 NOTE — Telephone Encounter (Signed)
Left msg on triage stating Breanna [name] call requesting her to call back...lmb

## 2013-05-28 ENCOUNTER — Telehealth: Payer: Self-pay | Admitting: *Deleted

## 2013-05-28 MED ORDER — METFORMIN HCL 850 MG PO TABS: 850.0000 mg | ORAL_TABLET | Freq: Two times a day (BID) | ORAL | Status: DC

## 2013-05-28 MED ORDER — PANTOPRAZOLE SODIUM 40 MG PO TBEC: 40.0000 mg | DELAYED_RELEASE_TABLET | Freq: Every day | ORAL | Status: DC

## 2013-05-28 MED ORDER — IBUPROFEN 600 MG PO TABS: 600.0000 mg | ORAL_TABLET | Freq: Two times a day (BID) | ORAL | Status: DC | PRN

## 2013-05-28 MED ORDER — CARVEDILOL 25 MG PO TABS: 25.0000 mg | ORAL_TABLET | Freq: Two times a day (BID) | ORAL | Status: DC

## 2013-05-28 MED ORDER — ALLOPURINOL 300 MG PO TABS: 300.0000 mg | ORAL_TABLET | Freq: Every day | ORAL | Status: DC

## 2013-05-28 MED ORDER — TORSEMIDE 20 MG PO TABS: 10.0000 mg | ORAL_TABLET | Freq: Every day | ORAL | Status: DC | PRN

## 2013-05-28 NOTE — Telephone Encounter (Signed)
Left msg on vm requesting 90 on meds that was sent to [facility]. Resending for 90 day...lmb

## 2013-05-28 NOTE — Telephone Encounter (Signed)
See previous msg already taking care of.../lmb 

## 2013-05-28 NOTE — Telephone Encounter (Signed)
Pt left msg on triage requesting to speak with stacy need new rx on her meds. Called pt back she states she is no longer using mail service. Will need new rx sen tto [facility] ibuprofen, pantoprazole, coreg, torsemide, metformin and allopurinol. Inform pt will send to pharmacy...[name]

## 2013-07-03 ENCOUNTER — Other Ambulatory Visit (INDEPENDENT_AMBULATORY_CARE_PROVIDER_SITE_OTHER): Payer: 59

## 2013-07-03 DIAGNOSIS — I10 Essential (primary) hypertension: Secondary | ICD-10-CM

## 2013-07-03 DIAGNOSIS — E119 Type 2 diabetes mellitus without complications: Secondary | ICD-10-CM

## 2013-07-03 LAB — BASIC METABOLIC PANEL
BUN: 19 mg/dL (ref 6–23)
CO2: 29 mEq/L (ref 19–32)
Calcium: 10.8 mg/dL — ABNORMAL HIGH (ref 8.4–10.5)
Chloride: 102 mEq/L (ref 96–112)
Creatinine, Ser: 0.9 mg/dL (ref 0.4–1.2)
GFR: 74.63 mL/min (ref >60.00)
Glucose, Bld: 100 mg/dL — ABNORMAL HIGH (ref 70–99)
Potassium: 4.2 mEq/L (ref 3.5–5.1)
Sodium: 152 mEq/L — ABNORMAL HIGH (ref 135–145)

## 2013-07-07 ENCOUNTER — Encounter: Payer: Self-pay | Admitting: Internal Medicine

## 2013-07-07 ENCOUNTER — Ambulatory Visit (INDEPENDENT_AMBULATORY_CARE_PROVIDER_SITE_OTHER): Payer: 59 | Admitting: Internal Medicine

## 2013-07-07 ENCOUNTER — Other Ambulatory Visit: Payer: Self-pay | Admitting: Internal Medicine

## 2013-07-07 VITALS — BP 150/80 | HR 64 | Temp 97.5°F | Resp 16 | Wt 146.0 lb

## 2013-07-07 DIAGNOSIS — R634 Abnormal weight loss: Secondary | ICD-10-CM

## 2013-07-07 DIAGNOSIS — M109 Gout, unspecified: Secondary | ICD-10-CM

## 2013-07-07 DIAGNOSIS — I6350 Cerebral infarction due to unspecified occlusion or stenosis of unspecified cerebral artery: Secondary | ICD-10-CM

## 2013-07-07 DIAGNOSIS — E119 Type 2 diabetes mellitus without complications: Secondary | ICD-10-CM

## 2013-07-07 DIAGNOSIS — E870 Hyperosmolality and hypernatremia: Secondary | ICD-10-CM | POA: Insufficient documentation

## 2013-07-07 MED ORDER — PANTOPRAZOLE SODIUM 40 MG PO TBEC: 40.0000 mg | DELAYED_RELEASE_TABLET | Freq: Every day | ORAL | Status: DC

## 2013-07-07 NOTE — Assessment & Plan Note (Signed)
Continue with current prescription therapy as reflected on the Med list.  

## 2013-07-07 NOTE — Progress Notes (Signed)
Patient ID: Breanna [name], female   DOB: [birth year], 79 y.o.   MRN: [id number]   Subjective:    HPI  F/u hosp stay (d/c'd on 4/2):  Discharge Diagnoses:    F/u fatigue, chills, lack of appetite since 10/13. She was getting better before the above illness. C/o stress at home  The patient presents for a follow-up of  chronic hypertension, chronic dyslipidemia, type 2 diabetes controlled with medicines F/u on R knee pain and LBP, OA.    F/u - B leg tingling and pain x few months, better   Wt Readings from Last 3 Encounters:  07/07/13 146 lb (66.225 kg)  03/31/13 153 lb (69.4 kg)  03/24/13 155 lb 3.3 oz (70.4 kg)   BP Readings from Last 3 Encounters:  07/07/13 160/90  03/31/13 138/70  03/25/13 114/67      Review of Systems  Constitutional: Negative for chills, activity change, appetite change, fatigue and unexpected weight change.  HENT: Negative for congestion, mouth sores and sinus pressure.   Eyes: Negative for visual disturbance.  Respiratory: Negative for chest tightness.   Genitourinary: Negative for frequency, difficulty urinating and vaginal pain.  Musculoskeletal: Positive for back pain, arthralgias and gait problem.  Skin: Negative for pallor.  Neurological: Negative for dizziness, tremors and weakness.  Psychiatric/Behavioral: Negative for suicidal ideas, confusion and sleep disturbance. The patient is nervous/anxious.        Objective:   Physical Exam  Constitutional: She appears well-developed. No distress.  HENT:  Head: Normocephalic.  Left Ear: External ear normal.  Nose: Nose normal.  Mouth/Throat: Oropharynx is clear and moist.  Eyes: Conjunctivae are normal. Pupils are equal, round, and reactive to light. Right eye exhibits no discharge. Left eye exhibits no discharge.  Neck: Normal range of motion. Neck supple. No JVD present. No tracheal deviation present. No thyromegaly present.  Cardiovascular: Normal rate, regular rhythm and normal heart  sounds.   Pulmonary/Chest: No stridor. No respiratory distress. She has no wheezes.  Abdominal: Soft. Bowel sounds are normal. She exhibits no distension and no mass. There is no tenderness. There is no rebound and no guarding.  Musculoskeletal: She exhibits tenderness (LS spine is tender w/ROM). She exhibits no edema.  R wrist and R ankle are tender  Lymphadenopathy:    She has no cervical adenopathy.  Neurological: She displays normal reflexes. No cranial nerve deficit. She exhibits normal muscle tone. Coordination normal.  Skin: No rash noted. No erythema.  Psychiatric: She has a normal mood and affect. Her behavior is normal. Judgment and thought content normal.     Lab Results  Component Value Date   WBC 5.5 03/31/2013   HGB 11.3* 03/31/2013   HCT 34.7* 03/31/2013   PLT 249.0 03/31/2013   GLUCOSE 100* 07/03/2013   CHOL 148 04/03/2011   TRIG 86.0 04/03/2011   HDL 47.30 04/03/2011   LDLCALC 84 04/03/2011   ALT 24 03/23/2013   AST 34 03/23/2013   NA 152* 07/03/2013   K 4.2 07/03/2013   CL 102 07/03/2013   CREATININE 0.9 07/03/2013   BUN 19 07/03/2013   CO2 29 07/03/2013   TSH 0.496 03/24/2013   HGBA1C 6.5 03/31/2013             Assessment & Plan:

## 2013-07-07 NOTE — Assessment & Plan Note (Signed)
Seems to be doing well  Wt Readings from Last 3 Encounters:  07/07/13 146 lb (66.225 kg)  03/31/13 153 lb (69.4 kg)  03/24/13 155 lb 3.3 oz (70.4 kg)

## 2013-07-07 NOTE — Assessment & Plan Note (Signed)
No relapse 

## 2013-07-07 NOTE — Telephone Encounter (Signed)
Rx already sent.

## 2013-07-07 NOTE — Assessment & Plan Note (Signed)
Drink more Re-check in 1 mo

## 2013-07-09 ENCOUNTER — Other Ambulatory Visit: Payer: Self-pay | Admitting: Internal Medicine

## 2013-07-22 ENCOUNTER — Other Ambulatory Visit (INDEPENDENT_AMBULATORY_CARE_PROVIDER_SITE_OTHER): Payer: 59

## 2013-07-22 DIAGNOSIS — E870 Hyperosmolality and hypernatremia: Secondary | ICD-10-CM

## 2013-07-22 LAB — BASIC METABOLIC PANEL
BUN: 22 mg/dL (ref 6–23)
CO2: 29 mEq/L (ref 19–32)
Calcium: 9.3 mg/dL (ref 8.4–10.5)
Chloride: 106 mEq/L (ref 96–112)
Creatinine, Ser: 1 mg/dL (ref 0.4–1.2)
GFR: 66.32 mL/min (ref >60.00)
Glucose, Bld: 96 mg/dL (ref 70–99)
Potassium: 4.3 mEq/L (ref 3.5–5.1)
Sodium: 141 mEq/L (ref 135–145)

## 2013-07-23 ENCOUNTER — Encounter: Payer: Self-pay | Admitting: *Deleted

## 2013-08-26 ENCOUNTER — Other Ambulatory Visit: Payer: Self-pay | Admitting: Internal Medicine

## 2013-08-26 DIAGNOSIS — Z1231 Encounter for screening mammogram for malignant neoplasm of breast: Secondary | ICD-10-CM

## 2013-09-30 ENCOUNTER — Ambulatory Visit (HOSPITAL_COMMUNITY)
Admission: RE | Admit: 2013-09-30 | Discharge: 2013-09-30 | Disposition: A | Discharge status: Home / Self Care | Payer: 59 | Source: Ambulatory Visit | Attending: Internal Medicine | Admitting: Internal Medicine

## 2013-09-30 DIAGNOSIS — Z1231 Encounter for screening mammogram for malignant neoplasm of breast: Secondary | ICD-10-CM | POA: Insufficient documentation

## 2013-10-06 ENCOUNTER — Ambulatory Visit (INDEPENDENT_AMBULATORY_CARE_PROVIDER_SITE_OTHER): Payer: 59 | Admitting: Internal Medicine

## 2013-10-06 ENCOUNTER — Other Ambulatory Visit (INDEPENDENT_AMBULATORY_CARE_PROVIDER_SITE_OTHER): Payer: 59

## 2013-10-06 ENCOUNTER — Encounter: Payer: Self-pay | Admitting: Internal Medicine

## 2013-10-06 VITALS — BP 148/82 | HR 72 | Temp 97.8°F | Resp 16 | Wt 140.0 lb

## 2013-10-06 DIAGNOSIS — I10 Essential (primary) hypertension: Secondary | ICD-10-CM

## 2013-10-06 DIAGNOSIS — M545 Low back pain, unspecified: Secondary | ICD-10-CM

## 2013-10-06 DIAGNOSIS — R634 Abnormal weight loss: Secondary | ICD-10-CM

## 2013-10-06 DIAGNOSIS — E119 Type 2 diabetes mellitus without complications: Secondary | ICD-10-CM

## 2013-10-06 DIAGNOSIS — Z23 Encounter for immunization: Secondary | ICD-10-CM

## 2013-10-06 LAB — TSH: TSH: 0.44 u[IU]/mL (ref 0.35–5.50)

## 2013-10-06 LAB — BASIC METABOLIC PANEL
BUN: 26 mg/dL — ABNORMAL HIGH (ref 6–23)
CO2: 30 mEq/L (ref 19–32)
Calcium: 9.5 mg/dL (ref 8.4–10.5)
Chloride: 106 mEq/L (ref 96–112)
Creatinine, Ser: 0.9 mg/dL (ref 0.4–1.2)
GFR: 74.58 mL/min (ref >60.00)
Glucose, Bld: 121 mg/dL — ABNORMAL HIGH (ref 70–99)
Potassium: 3.8 mEq/L (ref 3.5–5.1)
Sodium: 145 mEq/L (ref 135–145)

## 2013-10-06 LAB — HEMOGLOBIN A1C: Hgb A1c MFr Bld: 6.6 % — ABNORMAL HIGH (ref 4.6–6.5)

## 2013-10-06 MED ORDER — METFORMIN HCL 500 MG PO TABS: 500.0000 mg | ORAL_TABLET | Freq: Two times a day (BID) | ORAL | Status: DC

## 2013-10-06 NOTE — Progress Notes (Signed)
   Subjective:    HPI     F/u fatigue, chills, lack of appetite since 10/13.   The patient presents for a follow-up of  chronic hypertension, chronic dyslipidemia, type 2 diabetes controlled with medicines F/u on R knee pain and LBP, OA.    F/u - B leg tingling and pain x few months, better   Wt Readings from Last 3 Encounters:  10/06/13 140 lb (63.504 kg)  07/07/13 146 lb (66.225 kg)  03/31/13 153 lb (69.4 kg)   BP Readings from Last 3 Encounters:  10/06/13 148/82  07/07/13 150/80  03/31/13 138/70      Review of Systems  Constitutional: Negative for chills, activity change, appetite change, fatigue and unexpected weight change.  HENT: Negative for congestion, mouth sores and sinus pressure.   Eyes: Negative for visual disturbance.  Respiratory: Negative for chest tightness.   Genitourinary: Negative for frequency, difficulty urinating and vaginal pain.  Musculoskeletal: Positive for arthralgias, back pain and gait problem.  Skin: Negative for pallor.  Neurological: Negative for dizziness, tremors and weakness.  Psychiatric/Behavioral: Negative for suicidal ideas, confusion and sleep disturbance. The patient is nervous/anxious.        Objective:   Physical Exam  Constitutional: She appears well-developed. No distress.  HENT:  Head: Normocephalic.  Left Ear: External ear normal.  Nose: Nose normal.  Mouth/Throat: Oropharynx is clear and moist.  Eyes: Conjunctivae are normal. Pupils are equal, round, and reactive to light. Right eye exhibits no discharge. Left eye exhibits no discharge.  Neck: Normal range of motion. Neck supple. No JVD present. No tracheal deviation present. No thyromegaly present.  Cardiovascular: Normal rate, regular rhythm and normal heart sounds.   Pulmonary/Chest: No stridor. No respiratory distress. She has no wheezes.  Abdominal: Soft. Bowel sounds are normal. She exhibits no distension and no mass. There is no tenderness. There is no  rebound and no guarding.  Musculoskeletal: She exhibits tenderness (LS spine is tender w/ROM). She exhibits no edema.  R wrist and R ankle are tender  Lymphadenopathy:    She has no cervical adenopathy.  Neurological: She displays normal reflexes. No cranial nerve deficit. She exhibits normal muscle tone. Coordination normal.  Skin: No rash noted. No erythema.  Psychiatric: She has a normal mood and affect. Her behavior is normal. Judgment and thought content normal.     Lab Results  Component Value Date   WBC 5.5 03/31/2013   HGB 11.3* 03/31/2013   HCT 34.7* 03/31/2013   PLT 249.0 03/31/2013   GLUCOSE 96 07/22/2013   CHOL 148 04/03/2011   TRIG 86.0 04/03/2011   HDL 47.30 04/03/2011   LDLCALC 84 04/03/2011   ALT 24 03/23/2013   AST 34 03/23/2013   NA 141 07/22/2013   K 4.3 07/22/2013   CL 106 07/22/2013   CREATININE 1.0 07/22/2013   BUN 22 07/22/2013   CO2 29 07/22/2013   TSH 0.496 03/24/2013   HGBA1C 6.5 03/31/2013             Assessment & Plan:

## 2013-10-06 NOTE — Assessment & Plan Note (Addendum)
Pt is feeling well - will watch. I'll reduce Metformin dose

## 2013-10-06 NOTE — Assessment & Plan Note (Signed)
Continue with current prescription prn therapy as reflected on the Med list.  

## 2013-10-06 NOTE — Assessment & Plan Note (Signed)
Continue with current prescription therapy as reflected on the Med list.  

## 2013-10-15 ENCOUNTER — Ambulatory Visit (INDEPENDENT_AMBULATORY_CARE_PROVIDER_SITE_OTHER): Payer: 59 | Admitting: *Deleted

## 2013-10-15 ENCOUNTER — Telehealth: Payer: Self-pay | Admitting: *Deleted

## 2013-10-15 VITALS — BP 180/90

## 2013-10-15 DIAGNOSIS — I10 Essential (primary) hypertension: Secondary | ICD-10-CM

## 2013-10-15 MED ORDER — AMLODIPINE BESYLATE 5 MG PO TABS: 5.0000 mg | ORAL_TABLET | Freq: Every day | ORAL | Status: DC

## 2013-10-15 NOTE — Telephone Encounter (Signed)
Pt came for nurse visit/BP check. BP is 180/90. Her only complaint is dizziness. I instructed of Dr. Yetta Barre' instructions in telephone encounter. I also advised her to keep a record of BP readings and go to ER if she develops any new symptoms or her current symptoms worsen. Pt and son understand/agree to MD's advisement in PCP's absence. Please advise does she need OV sooner than her 01/07/14 appt?

## 2013-10-15 NOTE — Telephone Encounter (Signed)
Yes, she needs to be seen in the next 2-3 weeks

## 2013-10-15 NOTE — Telephone Encounter (Signed)
Pt called states BP has been running high for the past last two days yesterday it was 189/77 while at dentist office.  This morning it was 186/91.  Pt further states she has had lightheadedness.  Please advise in Dr Plotnikov's absence.

## 2013-10-15 NOTE — Telephone Encounter (Signed)
Start amlodipine and come in soon for a BP check

## 2013-10-15 NOTE — Telephone Encounter (Signed)
Left detailed message on VM rx sent and nurse visit needed for BP check

## 2013-10-15 NOTE — Progress Notes (Signed)
Pt here for BP check. See phone note from Dr. Yetta Barre. I advised pt of his instructions and advised her to go to ER if she develops any new symptoms ie. Pain, SOB, etc.

## 2013-10-16 NOTE — Telephone Encounter (Signed)
Left detailed mess informing pt of below.  

## 2013-10-22 ENCOUNTER — Ambulatory Visit (INDEPENDENT_AMBULATORY_CARE_PROVIDER_SITE_OTHER)
Admission: RE | Admit: 2013-10-22 | Discharge: 2013-10-22 | Disposition: A | Discharge status: Home / Self Care | Payer: 59 | Source: Ambulatory Visit | Attending: Internal Medicine | Admitting: Internal Medicine

## 2013-10-22 ENCOUNTER — Ambulatory Visit: Payer: 59 | Admitting: Internal Medicine

## 2013-10-22 ENCOUNTER — Ambulatory Visit (INDEPENDENT_AMBULATORY_CARE_PROVIDER_SITE_OTHER): Payer: 59 | Admitting: Internal Medicine

## 2013-10-22 ENCOUNTER — Encounter: Payer: Self-pay | Admitting: Internal Medicine

## 2013-10-22 VITALS — BP 162/92 | HR 72 | Temp 98.5°F | Resp 16 | Wt 141.0 lb

## 2013-10-22 DIAGNOSIS — M546 Pain in thoracic spine: Secondary | ICD-10-CM

## 2013-10-22 DIAGNOSIS — R634 Abnormal weight loss: Secondary | ICD-10-CM

## 2013-10-22 DIAGNOSIS — E119 Type 2 diabetes mellitus without complications: Secondary | ICD-10-CM

## 2013-10-22 MED ORDER — MELOXICAM 7.5 MG PO TABS: 7.5000 mg | ORAL_TABLET | Freq: Every day | ORAL | Status: DC

## 2013-10-22 NOTE — Assessment & Plan Note (Signed)
Continue with current prescription therapy as reflected on the Med list.  

## 2013-10-22 NOTE — Assessment & Plan Note (Signed)
10/14 R ?MSK CXR Thor spine X ray Pain meds

## 2013-10-22 NOTE — Progress Notes (Signed)
Subjective:    Chest Pain  This is a new problem. The current episode started 1 to 4 weeks ago (3 wks). The onset quality is gradual. The problem occurs constantly. The problem has been waxing and waning. Pain location: under R shoulder blade. The pain is severe. The quality of the pain is described as sharp. Radiates to: R side. Associated symptoms include back pain. Pertinent negatives include no dizziness or weakness. The pain is aggravated by breathing and movement. The treatment provided mild relief.       F/u fatigue, chills, lack of appetite since 10/13.   The patient presents for a follow-up of  chronic hypertension, chronic dyslipidemia, type 2 diabetes controlled with medicines F/u on R knee pain and LBP, OA.    F/u - B leg tingling and pain x few months, better   Wt Readings from Last 3 Encounters:  10/22/13 141 lb (63.957 kg)  10/06/13 140 lb (63.504 kg)  07/07/13 146 lb (66.225 kg)   BP Readings from Last 3 Encounters:  10/22/13 162/92  10/15/13 180/90  10/06/13 148/82      Review of Systems  Constitutional: Negative for chills, activity change, appetite change, fatigue and unexpected weight change.  HENT: Negative for congestion, mouth sores and sinus pressure.   Eyes: Negative for visual disturbance.  Respiratory: Negative for chest tightness.   Cardiovascular: Positive for chest pain.  Genitourinary: Negative for frequency, difficulty urinating and vaginal pain.  Musculoskeletal: Positive for arthralgias, back pain and gait problem.  Skin: Negative for pallor.  Neurological: Negative for dizziness, tremors and weakness.  Psychiatric/Behavioral: Negative for suicidal ideas, confusion and sleep disturbance. The patient is nervous/anxious.        Objective:   Physical Exam  Constitutional: She appears well-developed. No distress.  HENT:  Head: Normocephalic.  Left Ear: External ear normal.  Nose: Nose normal.  Mouth/Throat: Oropharynx is clear and  moist.  Eyes: Conjunctivae are normal. Pupils are equal, round, and reactive to light. Right eye exhibits no discharge. Left eye exhibits no discharge.  Neck: Normal range of motion. Neck supple. No JVD present. No tracheal deviation present. No thyromegaly present.  Cardiovascular: Normal rate, regular rhythm and normal heart sounds.   Pulmonary/Chest: No stridor. No respiratory distress. She has no wheezes.  Abdominal: Soft. Bowel sounds are normal. She exhibits no distension and no mass. There is no tenderness. There is no rebound and no guarding.  Musculoskeletal: She exhibits tenderness (LS spine is tender w/ROM). She exhibits no edema.  R wrist and R ankle are tender  Lymphadenopathy:    She has no cervical adenopathy.  Neurological: She displays normal reflexes. No cranial nerve deficit. She exhibits normal muscle tone. Coordination normal.  Skin: No rash noted. No erythema.  Psychiatric: She has a normal mood and affect. Her behavior is normal. Judgment and thought content normal.  No rash on chest Thor spine is tender w/ROM on R Ribs NT   Lab Results  Component Value Date   WBC 5.5 03/31/2013   HGB 11.3* 03/31/2013   HCT 34.7* 03/31/2013   PLT 249.0 03/31/2013   GLUCOSE 121* 10/06/2013   CHOL 148 04/03/2011   TRIG 86.0 04/03/2011   HDL 47.30 04/03/2011   LDLCALC 84 04/03/2011   ALT 24 03/23/2013   AST 34 03/23/2013   NA 145 10/06/2013   K 3.8 10/06/2013   CL 106 10/06/2013   CREATININE 0.9 10/06/2013   BUN 26* 10/06/2013   CO2 30 10/06/2013   TSH  0.44 10/06/2013   HGBA1C 6.6* 10/06/2013             Assessment & Plan:

## 2013-10-25 NOTE — Assessment & Plan Note (Signed)
Wt Readings from Last 3 Encounters:  10/22/13 141 lb (63.957 kg)  10/06/13 140 lb (63.504 kg)  07/07/13 146 lb (66.225 kg)

## 2013-10-28 ENCOUNTER — Telehealth: Payer: Self-pay | Admitting: *Deleted

## 2013-10-28 NOTE — Telephone Encounter (Signed)
Breanna [name] Fax: [phone] From: [facility] Date/ Time: 10/27/2013 5:42 PM Taken By: Glade Nurse, CSR Caller: [name] Facility: home Patient: Breanna [name], Breanna [name] DOB: Jan 19, 1931 Phone: [phone] Reason for Call: Nurse Kennyth Arnold called to give xray results to patient. Patient states she will be home in the morning. She is requesting a call back tomorrow morning 10/28/2013

## 2013-11-03 ENCOUNTER — Ambulatory Visit: Payer: 59 | Admitting: Internal Medicine

## 2013-11-12 ENCOUNTER — Ambulatory Visit: Payer: 59 | Admitting: Internal Medicine

## 2013-11-23 ENCOUNTER — Telehealth: Payer: Self-pay | Admitting: Internal Medicine

## 2013-11-23 MED ORDER — CLONAZEPAM 0.25 MG PO TBDP: 0.2500 mg | ORAL_TABLET | Freq: Two times a day (BID) | ORAL | Status: DC | PRN

## 2013-11-23 NOTE — Telephone Encounter (Signed)
Stacey, please, call in Clonazepam Thx

## 2013-11-23 NOTE — Telephone Encounter (Signed)
Rx phoned in. Pt informed.

## 2013-11-30 ENCOUNTER — Other Ambulatory Visit: Payer: Self-pay | Admitting: Internal Medicine

## 2013-12-25 ENCOUNTER — Other Ambulatory Visit: Payer: Self-pay | Admitting: Internal Medicine

## 2014-01-04 ENCOUNTER — Encounter: Payer: Self-pay | Admitting: Internal Medicine

## 2014-01-04 ENCOUNTER — Ambulatory Visit (INDEPENDENT_AMBULATORY_CARE_PROVIDER_SITE_OTHER): Payer: 59 | Admitting: Internal Medicine

## 2014-01-04 ENCOUNTER — Other Ambulatory Visit (INDEPENDENT_AMBULATORY_CARE_PROVIDER_SITE_OTHER): Payer: 59

## 2014-01-04 VITALS — BP 150/92 | HR 68 | Temp 98.8°F | Resp 16 | Wt 142.0 lb

## 2014-01-04 DIAGNOSIS — E119 Type 2 diabetes mellitus without complications: Secondary | ICD-10-CM

## 2014-01-04 DIAGNOSIS — I10 Essential (primary) hypertension: Secondary | ICD-10-CM

## 2014-01-04 DIAGNOSIS — M546 Pain in thoracic spine: Secondary | ICD-10-CM

## 2014-01-04 DIAGNOSIS — F4321 Adjustment disorder with depressed mood: Secondary | ICD-10-CM

## 2014-01-04 DIAGNOSIS — R634 Abnormal weight loss: Secondary | ICD-10-CM

## 2014-01-04 DIAGNOSIS — R55 Syncope and collapse: Secondary | ICD-10-CM

## 2014-01-04 LAB — CBC WITH DIFFERENTIAL/PLATELET
Basophils Absolute: 0 10*3/uL (ref 0.0–0.1)
Basophils Relative: 0 % (ref 0.0–3.0)
Eosinophils Absolute: 0.2 10*3/uL (ref 0.0–0.7)
Eosinophils Relative: 2.6 % (ref 0.0–5.0)
HCT: 35.6 % — ABNORMAL LOW (ref 36.0–46.0)
Hemoglobin: 11.5 g/dL — ABNORMAL LOW (ref 12.0–15.0)
Lymphocytes Relative: 24.2 % (ref 12.0–46.0)
Lymphs Abs: 1.5 10*3/uL (ref 0.7–4.0)
MCHC: 32.4 g/dL (ref 30.0–36.0)
MCV: 87.6 fl (ref 78.0–100.0)
Monocytes Absolute: 0.5 10*3/uL (ref 0.1–1.0)
Monocytes Relative: 8 % (ref 3.0–12.0)
Neutro Abs: 3.9 10*3/uL (ref 1.4–7.7)
Neutrophils Relative %: 65.2 % (ref 43.0–77.0)
Platelets: 168 10*3/uL (ref 150.0–400.0)
RBC: 4.06 Mil/uL (ref 3.87–5.11)
RDW: 17.5 % — ABNORMAL HIGH (ref 11.5–14.6)
WBC: 6 10*3/uL (ref 4.5–10.5)

## 2014-01-04 LAB — URINALYSIS, ROUTINE W REFLEX MICROSCOPIC
Bilirubin Urine: NEGATIVE
Hgb urine dipstick: NEGATIVE
Ketones, ur: NEGATIVE
Leukocytes, UA: NEGATIVE
Nitrite: NEGATIVE
RBC / HPF: NONE SEEN (ref 0–?)
Specific Gravity, Urine: 1.015 (ref 1.000–1.030)
Total Protein, Urine: 100 — AB
Urine Glucose: NEGATIVE
Urobilinogen, UA: 0.2 (ref 0.0–1.0)
pH: 7.5 (ref 5.0–8.0)

## 2014-01-04 LAB — BASIC METABOLIC PANEL
BUN: 22 mg/dL (ref 6–23)
CO2: 28 mEq/L (ref 19–32)
Calcium: 9.4 mg/dL (ref 8.4–10.5)
Chloride: 109 mEq/L (ref 96–112)
Creatinine, Ser: 0.8 mg/dL (ref 0.4–1.2)
GFR: 83.82 mL/min (ref >60.00)
Glucose, Bld: 102 mg/dL — ABNORMAL HIGH (ref 70–99)
Potassium: 4.2 mEq/L (ref 3.5–5.1)
Sodium: 144 mEq/L (ref 135–145)

## 2014-01-04 LAB — HEMOGLOBIN A1C: Hgb A1c MFr Bld: 6.4 % (ref 4.6–6.5)

## 2014-01-04 MED ORDER — IBUPROFEN 600 MG PO TABS: 600.0000 mg | ORAL_TABLET | Freq: Two times a day (BID) | ORAL | Status: DC | PRN

## 2014-01-04 NOTE — Assessment & Plan Note (Signed)
Continue with current prescription therapy as reflected on the Med list.  

## 2014-01-04 NOTE — Assessment & Plan Note (Signed)
1/15 ?vaso-vagal most likely Card f/u

## 2014-01-04 NOTE — Progress Notes (Signed)
   Subjective:    HPI    C/o passing out briefly on 12/24/13 C/o LBP Grieving  F/u fatigue, chills, lack of appetite since 10/13 - better.   The patient presents for a follow-up of  chronic hypertension, chronic dyslipidemia, type 2 diabetes controlled with medicines  F/u on R knee pain and LBP, OA.    F/u - B leg tingling and pain x few months, better   Wt Readings from Last 3 Encounters:  01/04/14 142 lb (64.411 kg)  10/22/13 141 lb (63.957 kg)  10/06/13 140 lb (63.504 kg)   BP Readings from Last 3 Encounters:  01/04/14 150/92  10/22/13 162/92  10/15/13 180/90      Review of Systems  Constitutional: Negative for chills, activity change, appetite change, fatigue and unexpected weight change.  HENT: Negative for congestion, mouth sores and sinus pressure.   Eyes: Negative for visual disturbance.  Respiratory: Negative for chest tightness.   Genitourinary: Negative for frequency, difficulty urinating and vaginal pain.  Musculoskeletal: Positive for arthralgias and gait problem.  Skin: Negative for pallor.  Neurological: Negative for tremors.  Psychiatric/Behavioral: Negative for suicidal ideas, confusion and sleep disturbance. The patient is nervous/anxious.        Objective:   Physical Exam  Constitutional: She appears well-developed. No distress.  HENT:  Head: Normocephalic.  Left Ear: External ear normal.  Nose: Nose normal.  Mouth/Throat: Oropharynx is clear and moist.  Eyes: Conjunctivae are normal. Pupils are equal, round, and reactive to light. Right eye exhibits no discharge. Left eye exhibits no discharge.  Neck: Normal range of motion. Neck supple. No JVD present. No tracheal deviation present. No thyromegaly present.  Cardiovascular: Normal rate, regular rhythm and normal heart sounds.   Pulmonary/Chest: No stridor. No respiratory distress. She has no wheezes.  Abdominal: Soft. Bowel sounds are normal. She exhibits no distension and no mass. There  is no tenderness. There is no rebound and no guarding.  Musculoskeletal: She exhibits tenderness (LS spine is tender w/ROM). She exhibits no edema.  R wrist and R ankle are tender  Lymphadenopathy:    She has no cervical adenopathy.  Neurological: She displays normal reflexes. No cranial nerve deficit. She exhibits normal muscle tone. Coordination normal.  Skin: No rash noted. No erythema.  Psychiatric: She has a normal mood and affect. Her behavior is normal. Judgment and thought content normal.  No rash on chest Thor spine is tender w/ROM on R Ribs NT   Lab Results  Component Value Date   WBC 5.5 03/31/2013   HGB 11.3* 03/31/2013   HCT 34.7* 03/31/2013   PLT 249.0 03/31/2013   GLUCOSE 121* 10/06/2013   CHOL 148 04/03/2011   TRIG 86.0 04/03/2011   HDL 47.30 04/03/2011   LDLCALC 84 04/03/2011   ALT 24 03/23/2013   AST 34 03/23/2013   NA 145 10/06/2013   K 3.8 10/06/2013   CL 106 10/06/2013   CREATININE 0.9 10/06/2013   BUN 26* 10/06/2013   CO2 30 10/06/2013   TSH 0.44 10/06/2013   HGBA1C 6.6* 10/06/2013             Assessment & Plan:

## 2014-01-04 NOTE — Assessment & Plan Note (Signed)
12-18-2023 Breanna [name] died

## 2014-01-04 NOTE — Progress Notes (Signed)
Pre visit review using our clinic review tool, if applicable. No additional management support is needed unless otherwise documented below in the visit note. 

## 2014-01-04 NOTE — Assessment & Plan Note (Signed)
Wt Readings from Last 3 Encounters:  01/04/14 142 lb (64.411 kg)  10/22/13 141 lb (63.957 kg)  10/06/13 140 lb (63.504 kg)  Better

## 2014-01-04 NOTE — Assessment & Plan Note (Signed)
10/14 R --- T9 compr fx Better

## 2014-01-07 ENCOUNTER — Other Ambulatory Visit: Payer: Self-pay | Admitting: Internal Medicine

## 2014-02-05 ENCOUNTER — Other Ambulatory Visit: Payer: Self-pay | Admitting: Internal Medicine

## 2014-04-07 ENCOUNTER — Ambulatory Visit (INDEPENDENT_AMBULATORY_CARE_PROVIDER_SITE_OTHER): Payer: PRIVATE HEALTH INSURANCE | Admitting: Internal Medicine

## 2014-04-07 ENCOUNTER — Other Ambulatory Visit (INDEPENDENT_AMBULATORY_CARE_PROVIDER_SITE_OTHER): Payer: 59

## 2014-04-07 ENCOUNTER — Encounter: Payer: Self-pay | Admitting: Internal Medicine

## 2014-04-07 VITALS — BP 140/80 | HR 72 | Temp 98.8°F | Resp 16 | Wt 148.0 lb

## 2014-04-07 DIAGNOSIS — E119 Type 2 diabetes mellitus without complications: Secondary | ICD-10-CM

## 2014-04-07 DIAGNOSIS — M25469 Effusion, unspecified knee: Secondary | ICD-10-CM

## 2014-04-07 DIAGNOSIS — M25462 Effusion, left knee: Secondary | ICD-10-CM | POA: Insufficient documentation

## 2014-04-07 DIAGNOSIS — I10 Essential (primary) hypertension: Secondary | ICD-10-CM

## 2014-04-07 DIAGNOSIS — M1990 Unspecified osteoarthritis, unspecified site: Secondary | ICD-10-CM

## 2014-04-07 DIAGNOSIS — F4321 Adjustment disorder with depressed mood: Secondary | ICD-10-CM

## 2014-04-07 DIAGNOSIS — Z23 Encounter for immunization: Secondary | ICD-10-CM

## 2014-04-07 LAB — BASIC METABOLIC PANEL
BUN: 26 mg/dL — ABNORMAL HIGH (ref 6–23)
CO2: 26 mEq/L (ref 19–32)
Calcium: 9.4 mg/dL (ref 8.4–10.5)
Chloride: 106 mEq/L (ref 96–112)
Creatinine, Ser: 1 mg/dL (ref 0.4–1.2)
GFR: 70.95 mL/min (ref >60.00)
Glucose, Bld: 108 mg/dL — ABNORMAL HIGH (ref 70–99)
Potassium: 4.4 mEq/L (ref 3.5–5.1)
Sodium: 140 mEq/L (ref 135–145)

## 2014-04-07 LAB — HEMOGLOBIN A1C: Hgb A1c MFr Bld: 6.3 % (ref 4.6–6.5)

## 2014-04-07 MED ORDER — GABAPENTIN 100 MG PO CAPS: 100.0000 mg | ORAL_CAPSULE | Freq: Three times a day (TID) | ORAL | Status: DC | PRN

## 2014-04-07 MED ORDER — MELOXICAM 7.5 MG PO TABS: ORAL_TABLET | ORAL | Status: DC

## 2014-04-07 MED ORDER — SERTRALINE HCL 100 MG PO TABS: ORAL_TABLET | ORAL | Status: DC

## 2014-04-07 NOTE — Progress Notes (Signed)
Pre visit review using our clinic review tool, if applicable. No additional management support is needed unless otherwise documented below in the visit note. 

## 2014-04-07 NOTE — Assessment & Plan Note (Signed)
Continue with current prescription therapy as reflected on the Med list.  

## 2014-04-07 NOTE — Progress Notes (Signed)
   Subjective:    HPI    C/o L knee pain and swelling C/o LBP Grieving    The patient presents for a follow-up of  chronic hypertension, chronic dyslipidemia, type 2 diabetes controlled with medicines  F/u on R knee pain and LBP, OA.    F/u - B leg tingling and pain  - better   Wt Readings from Last 3 Encounters:  04/07/14 148 lb (67.132 kg)  01/04/14 142 lb (64.411 kg)  10/22/13 141 lb (63.957 kg)   BP Readings from Last 3 Encounters:  04/07/14 140/80  01/04/14 150/92  10/22/13 162/92      Review of Systems  Constitutional: Negative for chills, activity change, appetite change, fatigue and unexpected weight change.  HENT: Negative for congestion, mouth sores and sinus pressure.   Eyes: Negative for visual disturbance.  Respiratory: Negative for chest tightness.   Genitourinary: Negative for frequency, difficulty urinating and vaginal pain.  Musculoskeletal: Positive for arthralgias and gait problem.  Skin: Negative for pallor.  Neurological: Negative for tremors.  Psychiatric/Behavioral: Negative for suicidal ideas, confusion and sleep disturbance. The patient is nervous/anxious.        Objective:   Physical Exam  Constitutional: She appears well-developed. No distress.  HENT:  Head: Normocephalic.  Left Ear: External ear normal.  Nose: Nose normal.  Mouth/Throat: Oropharynx is clear and moist.  Eyes: Conjunctivae are normal. Pupils are equal, round, and reactive to light. Right eye exhibits no discharge. Left eye exhibits no discharge.  Neck: Normal range of motion. Neck supple. No JVD present. No tracheal deviation present. No thyromegaly present.  Cardiovascular: Normal rate, regular rhythm and normal heart sounds.   Pulmonary/Chest: No stridor. No respiratory distress. She has no wheezes.  Abdominal: Soft. Bowel sounds are normal. She exhibits no distension and no mass. There is no tenderness. There is no rebound and no guarding.  Musculoskeletal: She  exhibits tenderness (LS spine is tender w/ROM). She exhibits no edema.  R wrist and R ankle are tender  Lymphadenopathy:    She has no cervical adenopathy.  Neurological: She displays normal reflexes. No cranial nerve deficit. She exhibits normal muscle tone. Coordination normal.  Skin: No rash noted. No erythema.  Psychiatric: She has a normal mood and affect. Her behavior is normal. Judgment and thought content normal.  L knee is s/p THR - swollen, tender medially and laterally   Lab Results  Component Value Date   WBC 6.0 01/04/2014   HGB 11.5* 01/04/2014   HCT 35.6* 01/04/2014   PLT 168.0 01/04/2014   GLUCOSE 102* 01/04/2014   CHOL 148 04/03/2011   TRIG 86.0 04/03/2011   HDL 47.30 04/03/2011   LDLCALC 84 04/03/2011   ALT 24 03/23/2013   AST 34 03/23/2013   NA 144 01/04/2014   K 4.2 01/04/2014   CL 109 01/04/2014   CREATININE 0.8 01/04/2014   BUN 22 01/04/2014   CO2 28 01/04/2014   TSH 0.44 10/06/2013   HGBA1C 6.4 01/04/2014             Assessment & Plan:

## 2014-04-07 NOTE — Assessment & Plan Note (Signed)
Meloxicam low dose w/caution Ortho f/u and X ray if not better

## 2014-04-07 NOTE — Assessment & Plan Note (Signed)
Discussed.

## 2014-04-08 ENCOUNTER — Encounter: Payer: Self-pay | Admitting: *Deleted

## 2014-06-02 ENCOUNTER — Other Ambulatory Visit: Payer: Self-pay | Admitting: Internal Medicine

## 2014-07-12 ENCOUNTER — Ambulatory Visit (INDEPENDENT_AMBULATORY_CARE_PROVIDER_SITE_OTHER): Payer: PRIVATE HEALTH INSURANCE | Admitting: Internal Medicine

## 2014-07-12 ENCOUNTER — Other Ambulatory Visit (INDEPENDENT_AMBULATORY_CARE_PROVIDER_SITE_OTHER): Payer: PRIVATE HEALTH INSURANCE

## 2014-07-12 ENCOUNTER — Encounter: Payer: Self-pay | Admitting: Internal Medicine

## 2014-07-12 VITALS — BP 148/76 | HR 57 | Temp 98.6°F | Ht 64.5 in | Wt 144.8 lb

## 2014-07-12 DIAGNOSIS — R634 Abnormal weight loss: Secondary | ICD-10-CM

## 2014-07-12 DIAGNOSIS — E785 Hyperlipidemia, unspecified: Secondary | ICD-10-CM

## 2014-07-12 DIAGNOSIS — E119 Type 2 diabetes mellitus without complications: Secondary | ICD-10-CM

## 2014-07-12 DIAGNOSIS — I10 Essential (primary) hypertension: Secondary | ICD-10-CM

## 2014-07-12 DIAGNOSIS — M1990 Unspecified osteoarthritis, unspecified site: Secondary | ICD-10-CM

## 2014-07-12 DIAGNOSIS — M545 Low back pain, unspecified: Secondary | ICD-10-CM

## 2014-07-12 LAB — LIPID PANEL
Cholesterol: 131 mg/dL (ref 0–200)
HDL: 59.4 mg/dL (ref >39.00)
LDL Cholesterol: 62 mg/dL (ref 0–99)
NonHDL: 71.6
Total CHOL/HDL Ratio: 2
Triglycerides: 46 mg/dL (ref 0.0–149.0)
VLDL: 9.2 mg/dL (ref 0.0–40.0)

## 2014-07-12 LAB — BASIC METABOLIC PANEL
BUN: 18 mg/dL (ref 6–23)
CO2: 29 mEq/L (ref 19–32)
Calcium: 9.2 mg/dL (ref 8.4–10.5)
Chloride: 106 mEq/L (ref 96–112)
Creatinine, Ser: 1 mg/dL (ref 0.4–1.2)
GFR: 69.26 mL/min (ref >60.00)
Glucose, Bld: 113 mg/dL — ABNORMAL HIGH (ref 70–99)
Potassium: 3.8 mEq/L (ref 3.5–5.1)
Sodium: 141 mEq/L (ref 135–145)

## 2014-07-12 LAB — HEMOGLOBIN A1C: Hgb A1c MFr Bld: 6.6 % — ABNORMAL HIGH (ref 4.6–6.5)

## 2014-07-12 MED ORDER — METFORMIN HCL 500 MG PO TABS: 500.0000 mg | ORAL_TABLET | Freq: Every day | ORAL | Status: DC

## 2014-07-12 MED ORDER — PANTOPRAZOLE SODIUM 40 MG PO TBEC: 40.0000 mg | DELAYED_RELEASE_TABLET | Freq: Every day | ORAL | Status: DC

## 2014-07-12 MED ORDER — CARVEDILOL 25 MG PO TABS: ORAL_TABLET | ORAL | Status: DC

## 2014-07-12 MED ORDER — TORSEMIDE 20 MG PO TABS: ORAL_TABLET | ORAL | Status: DC

## 2014-07-12 NOTE — Assessment & Plan Note (Signed)
Continue with current prescription therapy as reflected on the Med list.  

## 2014-07-12 NOTE — Assessment & Plan Note (Signed)
Lost wt Taking Metformin qd Labs

## 2014-07-12 NOTE — Assessment & Plan Note (Signed)
Watching  

## 2014-07-12 NOTE — Progress Notes (Signed)
Pre visit review using our clinic review tool, if applicable. No additional management support is needed unless otherwise documented below in the visit note. 

## 2014-07-12 NOTE — Patient Instructions (Signed)
   Milk free trial (no milk, ice cream, cheese and yogurt) for 4-6 weeks. OK to use almond, coconut, rice or soy milk. "Almond breeze" brand tastes good.  

## 2014-07-12 NOTE — Progress Notes (Signed)
   Subjective:    HPI    F/u L>R knee pain, OA F/u LBP Grieving    The patient presents for a follow-up of  chronic hypertension, chronic dyslipidemia, type 2 diabetes controlled with medicines   F/u - B leg tingling and pain  - better   Wt Readings from Last 3 Encounters:  07/12/14 144 lb 12.8 oz (65.681 kg)  04/07/14 148 lb (67.132 kg)  01/04/14 142 lb (64.411 kg)   BP Readings from Last 3 Encounters:  07/12/14 148/76  04/07/14 140/80  01/04/14 150/92      Review of Systems  Constitutional: Negative for chills, activity change, appetite change, fatigue and unexpected weight change.  HENT: Negative for congestion, mouth sores and sinus pressure.   Eyes: Negative for visual disturbance.  Respiratory: Negative for chest tightness.   Genitourinary: Negative for frequency, difficulty urinating and vaginal pain.  Musculoskeletal: Positive for arthralgias and gait problem.  Skin: Negative for pallor.  Neurological: Negative for tremors.  Psychiatric/Behavioral: Negative for suicidal ideas, confusion and sleep disturbance. The patient is nervous/anxious.        Objective:   Physical Exam  Constitutional: She appears well-developed. No distress.  HENT:  Head: Normocephalic.  Left Ear: External ear normal.  Nose: Nose normal.  Mouth/Throat: Oropharynx is clear and moist.  Eyes: Conjunctivae are normal. Pupils are equal, round, and reactive to light. Right eye exhibits no discharge. Left eye exhibits no discharge.  Neck: Normal range of motion. Neck supple. No JVD present. No tracheal deviation present. No thyromegaly present.  Cardiovascular: Normal rate, regular rhythm and normal heart sounds.   Pulmonary/Chest: No stridor. No respiratory distress. She has no wheezes.  Abdominal: Soft. Bowel sounds are normal. She exhibits no distension and no mass. There is no tenderness. There is no rebound and no guarding.  Musculoskeletal: She exhibits tenderness (LS spine is  tender w/ROM). She exhibits no edema.  R wrist and R ankle are tender  Lymphadenopathy:    She has no cervical adenopathy.  Neurological: She displays normal reflexes. No cranial nerve deficit. She exhibits normal muscle tone. Coordination normal.  Skin: No rash noted. No erythema.  Psychiatric: She has a normal mood and affect. Her behavior is normal. Judgment and thought content normal.  L knee is s/p THR - not swollen, NT Not sad   Lab Results  Component Value Date   WBC 6.0 01/04/2014   HGB 11.5* 01/04/2014   HCT 35.6* 01/04/2014   PLT 168.0 01/04/2014   GLUCOSE 108* 04/07/2014   CHOL 148 04/03/2011   TRIG 86.0 04/03/2011   HDL 47.30 04/03/2011   LDLCALC 84 04/03/2011   ALT 24 03/23/2013   AST 34 03/23/2013   NA 140 04/07/2014   K 4.4 04/07/2014   CL 106 04/07/2014   CREATININE 1.0 04/07/2014   BUN 26* 04/07/2014   CO2 26 04/07/2014   TSH 0.44 10/06/2013   HGBA1C 6.3 04/07/2014             Assessment & Plan:

## 2014-07-12 NOTE — Assessment & Plan Note (Signed)
Labs

## 2014-07-13 ENCOUNTER — Other Ambulatory Visit: Payer: Self-pay | Admitting: Internal Medicine

## 2014-08-20 ENCOUNTER — Encounter: Payer: Self-pay | Admitting: Internal Medicine

## 2014-08-20 ENCOUNTER — Other Ambulatory Visit: Payer: Self-pay | Admitting: Internal Medicine

## 2014-08-20 DIAGNOSIS — Z1231 Encounter for screening mammogram for malignant neoplasm of breast: Secondary | ICD-10-CM

## 2014-10-05 ENCOUNTER — Ambulatory Visit (HOSPITAL_COMMUNITY)
Admission: RE | Admit: 2014-10-05 | Discharge: 2014-10-05 | Disposition: A | Discharge status: Home / Self Care | Payer: PRIVATE HEALTH INSURANCE | Source: Ambulatory Visit | Attending: Internal Medicine | Admitting: Internal Medicine

## 2014-10-05 DIAGNOSIS — Z1231 Encounter for screening mammogram for malignant neoplasm of breast: Secondary | ICD-10-CM | POA: Diagnosis not present

## 2014-10-14 DIAGNOSIS — H18239 Secondary corneal edema, unspecified eye: Secondary | ICD-10-CM | POA: Insufficient documentation

## 2014-10-19 ENCOUNTER — Encounter: Payer: Self-pay | Admitting: Internal Medicine

## 2014-10-19 ENCOUNTER — Ambulatory Visit (INDEPENDENT_AMBULATORY_CARE_PROVIDER_SITE_OTHER): Payer: PRIVATE HEALTH INSURANCE | Admitting: Internal Medicine

## 2014-10-19 ENCOUNTER — Other Ambulatory Visit (INDEPENDENT_AMBULATORY_CARE_PROVIDER_SITE_OTHER): Payer: PRIVATE HEALTH INSURANCE

## 2014-10-19 VITALS — BP 140/80 | HR 55 | Temp 98.2°F | Wt 147.0 lb

## 2014-10-19 DIAGNOSIS — E1129 Type 2 diabetes mellitus with other diabetic kidney complication: Secondary | ICD-10-CM

## 2014-10-19 DIAGNOSIS — I10 Essential (primary) hypertension: Secondary | ICD-10-CM

## 2014-10-19 DIAGNOSIS — E119 Type 2 diabetes mellitus without complications: Secondary | ICD-10-CM

## 2014-10-19 DIAGNOSIS — E785 Hyperlipidemia, unspecified: Secondary | ICD-10-CM

## 2014-10-19 DIAGNOSIS — N058 Unspecified nephritic syndrome with other morphologic changes: Secondary | ICD-10-CM

## 2014-10-19 DIAGNOSIS — M5440 Lumbago with sciatica, unspecified side: Secondary | ICD-10-CM

## 2014-10-19 LAB — BASIC METABOLIC PANEL
BUN: 20 mg/dL (ref 6–23)
CO2: 28 mEq/L (ref 19–32)
Calcium: 9.4 mg/dL (ref 8.4–10.5)
Chloride: 107 mEq/L (ref 96–112)
Creatinine, Ser: 1 mg/dL (ref 0.4–1.2)
GFR: 67.63 mL/min (ref >60.00)
Glucose, Bld: 107 mg/dL — ABNORMAL HIGH (ref 70–99)
Potassium: 3.9 mEq/L (ref 3.5–5.1)
Sodium: 142 mEq/L (ref 135–145)

## 2014-10-19 LAB — LIPID PANEL
Cholesterol: 154 mg/dL (ref 0–200)
HDL: 61.1 mg/dL (ref >39.00)
LDL Cholesterol: 82 mg/dL (ref 0–99)
NonHDL: 92.9
Total CHOL/HDL Ratio: 3
Triglycerides: 56 mg/dL (ref 0.0–149.0)
VLDL: 11.2 mg/dL (ref 0.0–40.0)

## 2014-10-19 LAB — HEMOGLOBIN A1C: Hgb A1c MFr Bld: 6.3 % (ref 4.6–6.5)

## 2014-10-19 NOTE — Progress Notes (Signed)
Pre visit review using our clinic review tool, if applicable. No additional management support is needed unless otherwise documented below in the visit note. 

## 2014-10-19 NOTE — Assessment & Plan Note (Signed)
Continue with current prescription therapy as reflected on the Med list.  

## 2014-10-19 NOTE — Assessment & Plan Note (Signed)
Continue with current prescription therapy as reflected on the Med list. F/u w/Nephrology

## 2014-10-19 NOTE — Progress Notes (Signed)
   Subjective:    HPI    F/u L>R knee pain, OA F/u LBP F/u grieving - coping ok    The patient presents for a follow-up of  chronic hypertension, chronic dyslipidemia, type 2 diabetes controlled with medicines   F/u - B leg tingling and pain  - better   Wt Readings from Last 3 Encounters:  10/19/14 147 lb (66.679 kg)  07/12/14 144 lb 12.8 oz (65.681 kg)  04/07/14 148 lb (67.132 kg)   BP Readings from Last 3 Encounters:  10/19/14 140/80  07/12/14 148/76  04/07/14 140/80      Review of Systems  Constitutional: Negative for chills, activity change, appetite change, fatigue and unexpected weight change.  HENT: Negative for congestion, mouth sores and sinus pressure.   Eyes: Negative for visual disturbance.  Respiratory: Negative for chest tightness.   Genitourinary: Negative for frequency, difficulty urinating and vaginal pain.  Musculoskeletal: Positive for arthralgias and gait problem.  Skin: Negative for pallor.  Neurological: Negative for tremors.  Psychiatric/Behavioral: Negative for suicidal ideas, confusion and sleep disturbance. The patient is nervous/anxious.        Objective:   Physical Exam  Constitutional: She appears well-developed. No distress.  HENT:  Head: Normocephalic.  Left Ear: External ear normal.  Nose: Nose normal.  Mouth/Throat: Oropharynx is clear and moist.  Eyes: Conjunctivae are normal. Pupils are equal, round, and reactive to light. Right eye exhibits no discharge. Left eye exhibits no discharge.  Neck: Normal range of motion. Neck supple. No JVD present. No tracheal deviation present. No thyromegaly present.  Cardiovascular: Normal rate, regular rhythm and normal heart sounds.   Pulmonary/Chest: No stridor. No respiratory distress. She has no wheezes.  Abdominal: Soft. Bowel sounds are normal. She exhibits no distension and no mass. There is no tenderness. There is no rebound and no guarding.  Musculoskeletal: She exhibits  tenderness (LS spine is tender w/ROM). She exhibits no edema.  R wrist and R ankle are tender  Lymphadenopathy:    She has no cervical adenopathy.  Neurological: She displays normal reflexes. No cranial nerve deficit. She exhibits normal muscle tone. Coordination normal.  Skin: No rash noted. No erythema.  Psychiatric: She has a normal mood and affect. Her behavior is normal. Judgment and thought content normal.  L knee is s/p THR - not swollen, NT Not sad   Lab Results  Component Value Date   WBC 6.0 01/04/2014   HGB 11.5* 01/04/2014   HCT 35.6* 01/04/2014   PLT 168.0 01/04/2014   GLUCOSE 113* 07/12/2014   CHOL 131 07/12/2014   TRIG 46.0 07/12/2014   HDL 59.40 07/12/2014   LDLCALC 62 07/12/2014   ALT 24 03/23/2013   AST 34 03/23/2013   NA 141 07/12/2014   K 3.8 07/12/2014   CL 106 07/12/2014   CREATININE 1.0 07/12/2014   BUN 18 07/12/2014   CO2 29 07/12/2014   TSH 0.44 10/06/2013   HGBA1C 6.6* 07/12/2014             Assessment & Plan:

## 2014-10-20 ENCOUNTER — Telehealth: Payer: Self-pay | Admitting: Internal Medicine

## 2014-10-20 NOTE — Telephone Encounter (Signed)
emmi mailed  °

## 2014-10-24 ENCOUNTER — Encounter: Payer: Self-pay | Admitting: Internal Medicine

## 2014-11-04 ENCOUNTER — Other Ambulatory Visit: Payer: Self-pay | Admitting: *Deleted

## 2014-11-04 NOTE — Telephone Encounter (Signed)
Left msg on triage requesting refill on her clonazepam.../lmb

## 2014-11-05 NOTE — Telephone Encounter (Signed)
Please wait for PCP to return. Last marked not taking will not refill today.

## 2014-11-05 NOTE — Telephone Encounter (Signed)
MD out of office. Is this ok to refill.../lmb 

## 2014-11-05 NOTE — Telephone Encounter (Signed)
Notified pt with update on request. Will give her a call bck with Dr. Alain Marion response on Monday...Breanna [name]

## 2014-11-08 MED ORDER — CLONAZEPAM 0.25 MG PO TBDP: 0.2500 mg | ORAL_TABLET | Freq: Two times a day (BID) | ORAL | Status: DC | PRN

## 2014-11-08 NOTE — Telephone Encounter (Signed)
MD ok clonazepam called [facility] spoke with Erasmo Downer gave md authorization. Notified pt med has been call into pharmacy...Breanna [name]

## 2014-11-22 DIAGNOSIS — H401134 Primary open-angle glaucoma, bilateral, indeterminate stage: Secondary | ICD-10-CM | POA: Insufficient documentation

## 2014-11-22 DIAGNOSIS — H189 Unspecified disorder of cornea: Secondary | ICD-10-CM | POA: Insufficient documentation

## 2014-12-03 ENCOUNTER — Other Ambulatory Visit: Payer: Self-pay | Admitting: Internal Medicine

## 2015-01-05 ENCOUNTER — Other Ambulatory Visit: Payer: Self-pay | Admitting: Internal Medicine

## 2015-01-12 ENCOUNTER — Emergency Department (HOSPITAL_COMMUNITY): Payer: Medicare Other

## 2015-01-12 ENCOUNTER — Encounter (HOSPITAL_COMMUNITY): Payer: Self-pay | Admitting: *Deleted

## 2015-01-12 ENCOUNTER — Encounter: Payer: Self-pay | Admitting: Internal Medicine

## 2015-01-12 ENCOUNTER — Emergency Department (HOSPITAL_COMMUNITY)
Admission: EM | Admit: 2015-01-12 | Discharge: 2015-01-13 | Disposition: A | Discharge status: Home / Self Care | Payer: Medicare Other | Attending: Emergency Medicine | Admitting: Emergency Medicine

## 2015-01-12 ENCOUNTER — Ambulatory Visit (INDEPENDENT_AMBULATORY_CARE_PROVIDER_SITE_OTHER): Payer: 59 | Admitting: Internal Medicine

## 2015-01-12 ENCOUNTER — Encounter: Payer: 59 | Admitting: Internal Medicine

## 2015-01-12 VITALS — BP 160/80 | HR 95 | Wt 150.0 lb

## 2015-01-12 DIAGNOSIS — E1129 Type 2 diabetes mellitus with other diabetic kidney complication: Secondary | ICD-10-CM

## 2015-01-12 DIAGNOSIS — M153 Secondary multiple arthritis: Secondary | ICD-10-CM

## 2015-01-12 DIAGNOSIS — Y998 Other external cause status: Secondary | ICD-10-CM | POA: Insufficient documentation

## 2015-01-12 DIAGNOSIS — Z8673 Personal history of transient ischemic attack (TIA), and cerebral infarction without residual deficits: Secondary | ICD-10-CM | POA: Insufficient documentation

## 2015-01-12 DIAGNOSIS — W010XXA Fall on same level from slipping, tripping and stumbling without subsequent striking against object, initial encounter: Secondary | ICD-10-CM | POA: Insufficient documentation

## 2015-01-12 DIAGNOSIS — I639 Cerebral infarction, unspecified: Secondary | ICD-10-CM

## 2015-01-12 DIAGNOSIS — Y92099 Unspecified place in other non-institutional residence as the place of occurrence of the external cause: Secondary | ICD-10-CM

## 2015-01-12 DIAGNOSIS — N058 Unspecified nephritic syndrome with other morphologic changes: Secondary | ICD-10-CM

## 2015-01-12 DIAGNOSIS — E119 Type 2 diabetes mellitus without complications: Secondary | ICD-10-CM | POA: Insufficient documentation

## 2015-01-12 DIAGNOSIS — M109 Gout, unspecified: Secondary | ICD-10-CM | POA: Diagnosis not present

## 2015-01-12 DIAGNOSIS — S6992XA Unspecified injury of left wrist, hand and finger(s), initial encounter: Secondary | ICD-10-CM | POA: Diagnosis present

## 2015-01-12 DIAGNOSIS — Z79899 Other long term (current) drug therapy: Secondary | ICD-10-CM | POA: Diagnosis not present

## 2015-01-12 DIAGNOSIS — Z862 Personal history of diseases of the blood and blood-forming organs and certain disorders involving the immune mechanism: Secondary | ICD-10-CM | POA: Diagnosis not present

## 2015-01-12 DIAGNOSIS — K219 Gastro-esophageal reflux disease without esophagitis: Secondary | ICD-10-CM | POA: Insufficient documentation

## 2015-01-12 DIAGNOSIS — Y9289 Other specified places as the place of occurrence of the external cause: Secondary | ICD-10-CM | POA: Insufficient documentation

## 2015-01-12 DIAGNOSIS — Y9389 Activity, other specified: Secondary | ICD-10-CM | POA: Insufficient documentation

## 2015-01-12 DIAGNOSIS — S60212A Contusion of left wrist, initial encounter: Secondary | ICD-10-CM | POA: Diagnosis not present

## 2015-01-12 DIAGNOSIS — M1990 Unspecified osteoarthritis, unspecified site: Secondary | ICD-10-CM | POA: Diagnosis not present

## 2015-01-12 DIAGNOSIS — W19XXXA Unspecified fall, initial encounter: Secondary | ICD-10-CM

## 2015-01-12 DIAGNOSIS — T148XXA Other injury of unspecified body region, initial encounter: Secondary | ICD-10-CM

## 2015-01-12 DIAGNOSIS — S0011XA Contusion of right eyelid and periocular area, initial encounter: Secondary | ICD-10-CM | POA: Diagnosis not present

## 2015-01-12 DIAGNOSIS — I10 Essential (primary) hypertension: Secondary | ICD-10-CM | POA: Insufficient documentation

## 2015-01-12 DIAGNOSIS — Z86010 Personal history of colonic polyps: Secondary | ICD-10-CM | POA: Insufficient documentation

## 2015-01-12 DIAGNOSIS — Z8742 Personal history of other diseases of the female genital tract: Secondary | ICD-10-CM | POA: Diagnosis not present

## 2015-01-12 DIAGNOSIS — Z872 Personal history of diseases of the skin and subcutaneous tissue: Secondary | ICD-10-CM | POA: Insufficient documentation

## 2015-01-12 DIAGNOSIS — Y92009 Unspecified place in unspecified non-institutional (private) residence as the place of occurrence of the external cause: Secondary | ICD-10-CM

## 2015-01-12 DIAGNOSIS — S0990XA Unspecified injury of head, initial encounter: Secondary | ICD-10-CM | POA: Insufficient documentation

## 2015-01-12 DIAGNOSIS — Z658 Other specified problems related to psychosocial circumstances: Secondary | ICD-10-CM | POA: Insufficient documentation

## 2015-01-12 DIAGNOSIS — S0031XA Abrasion of nose, initial encounter: Secondary | ICD-10-CM | POA: Diagnosis not present

## 2015-01-12 DIAGNOSIS — I6350 Cerebral infarction due to unspecified occlusion or stenosis of unspecified cerebral artery: Secondary | ICD-10-CM

## 2015-01-12 DIAGNOSIS — Z7982 Long term (current) use of aspirin: Secondary | ICD-10-CM | POA: Insufficient documentation

## 2015-01-12 MED ORDER — HYDROCODONE-ACETAMINOPHEN 5-325 MG PO TABS
1.0000 | ORAL_TABLET | Freq: Once | ORAL | Status: AC
  Administered 2015-01-12: 1 via ORAL
  Filled 2015-01-12: qty 1

## 2015-01-12 MED ORDER — ONDANSETRON 4 MG PO TBDP
4.0000 mg | ORAL_TABLET | Freq: Once | ORAL | Status: AC
  Administered 2015-01-12: 4 mg via ORAL
  Filled 2015-01-12: qty 1

## 2015-01-12 NOTE — Assessment & Plan Note (Signed)
Wt Readings from Last 3 Encounters:  01/12/15 150 lb (68.04 kg)  10/19/14 147 lb (66.679 kg)  07/12/14 144 lb 12.8 oz (65.681 kg)

## 2015-01-12 NOTE — ED Provider Notes (Signed)
Medical screening examination/treatment/procedure(s) were conducted as a shared visit with non-physician practitioner(s) and myself.  I personally evaluated the patient during the encounter.   EKG Interpretation None      Pt is a 81 y.o. female with history of hypertension, diabetes and had a mechanical fall today when she tripped over her daughter's oxygen tubing. States she had her head on the floor but no loss of consciousness. States she caught herself with her left hand. CT imaging of her head and cervical spine show no acute injury. She does have a remote inferior left cerebellar infarct seen on CT scan. X-rays of the left hand show no acute fracture or dislocation. We'll discharge home.  [place], DO 01/12/15 2359

## 2015-01-12 NOTE — Assessment & Plan Note (Signed)
Check BP at home Continue with current prescription therapy as reflected on the Med list.

## 2015-01-12 NOTE — Assessment & Plan Note (Signed)
May need PT, HHA Continue with current prescription therapy as reflected on the Med list.

## 2015-01-12 NOTE — ED Notes (Signed)
The pt fell earlier today striking her head on the floor.  No loc  She was seen by her doctor for the same but was given the ok.  No xrays were done.  Shed nios has pain in the side oif her head and pain in her lt hand and wrist

## 2015-01-12 NOTE — Assessment & Plan Note (Signed)
Continue with current prescription therapy as reflected on the Med list.  

## 2015-01-12 NOTE — Progress Notes (Addendum)
Subjective:   F/u L>R knee pain, OA F/u LBP F/u grieving - coping ok    The patient presents for a follow-up of  chronic hypertension, chronic dyslipidemia, type 2 diabetes controlled with medicines. F/u LBP/OA. C/o poor vision - surgery pending Feb 18th. [employer] told pt's dtr that they can have assistance at home    Wt Readings from Last 3 Encounters:  01/12/15 150 lb (68.04 kg)  10/19/14 147 lb (66.679 kg)  07/12/14 144 lb 12.8 oz (65.681 kg)   BP Readings from Last 3 Encounters:  01/12/15 190/100  10/19/14 140/80  07/12/14 148/76      Review of Systems  Constitutional: Negative for chills, activity change, appetite change, fatigue and unexpected weight change.  HENT: Negative for congestion, mouth sores and sinus pressure.   Eyes: Negative for visual disturbance.  Respiratory: Negative for chest tightness.   Genitourinary: Negative for frequency, difficulty urinating and vaginal pain.  Musculoskeletal: Positive for arthralgias and gait problem.  Skin: Negative for pallor.  Neurological: Negative for tremors.  Psychiatric/Behavioral: Negative for suicidal ideas, confusion and sleep disturbance. The patient is nervous/anxious.        Objective:   Physical Exam  Constitutional: She appears well-developed. No distress.  HENT:  Head: Normocephalic.  Left Ear: External ear normal.  Nose: Nose normal.  Mouth/Throat: Oropharynx is clear and moist.  Eyes: Conjunctivae are normal. Pupils are equal, round, and reactive to light. Right eye exhibits no discharge. Left eye exhibits no discharge.  Neck: Normal range of motion. Neck supple. No JVD present. No tracheal deviation present. No thyromegaly present.  Cardiovascular: Normal rate, regular rhythm and normal heart sounds.   Pulmonary/Chest: No stridor. No respiratory distress. She has no wheezes.  Abdominal: Soft. Bowel sounds are normal. She exhibits no distension and no mass. There is no tenderness. There is no rebound  and no guarding.  Musculoskeletal: She exhibits tenderness (LS spine is tender w/ROM). She exhibits no edema.  R wrist and R ankle are tender  Lymphadenopathy:    She has no cervical adenopathy.  Neurological: She displays normal reflexes. No cranial nerve deficit. She exhibits normal muscle tone. Coordination normal.  Skin: No rash noted. No erythema.  Psychiatric: She has a normal mood and affect. Her behavior is normal. Judgment and thought content normal.  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 No LOC. C/o pain in wrists, knees, face. No n/v.  Pt was examined. She has 3-4 mm abrasions on nose saddle and 2 fingers on L hand. Knees and wrists, neck, nose are slightely tender. Nose saddle is puffy. BP re-checked.   Today's visits total time >40 min  Assessment & Plan:

## 2015-01-12 NOTE — ED Provider Notes (Signed)
CSN: [id number]     Arrival date & time 01/12/15  2214 History   First MD Initiated Contact with Patient 01/12/15 2237     Chief Complaint  Patient presents with  . Fall     (Consider location/radiation/quality/duration/timing/severity/associated sxs/prior Treatment) HPI Comments: 81 y/o female fell yesterday face forward after tripping over her sister's oxygen cord.  C/o left wrist pain and facial pain. Denies pain with eye movement epistaxis. LOC.  Patient is a 81 y.o. female presenting with fall.  Fall This is a new problem. The current episode started yesterday. Associated symptoms include arthralgias (Left wrist and nose) and joint swelling. Pertinent negatives include no abdominal pain, anorexia, change in bowel habit, chest pain, chills, congestion, coughing, diaphoresis, fatigue, fever, headaches, myalgias, nausea, neck pain, numbness, rash, sore throat, swollen glands, urinary symptoms, vertigo, visual change, vomiting or weakness.    Past Medical History  Diagnosis Date  . History of colon polyps   . Type II or unspecified type diabetes mellitus without mention of complication, not stated as uncontrolled   . Diverticulitis   . Gout   . HTN (hypertension)   . Osteoarthritis   . Hyperlipidemia   . Pulmonary HTN   . Hydronephrosis     LEFT/ Surgical intervention  . Adrenal adenoma 2006  . GERD (gastroesophageal reflux disease)   . Esophageal spasm 2011  . Boils 2009  . CVA (cerebral infarction) 2010    Cerebellar  . LBP (low back pain)   . Cholelithiasis     asympt. w/normal HIDA 03/2010  . Stress    Past Surgical History  Procedure Laterality Date  . Colectomy  2006    Sigmoid  . Abdominal hysterectomy    . Total knee arthroplasty  2003    LEFT  . Rotator cuff repair  2008    RIGHT  . Breast biopsy      RIGHT  . Cataract extraction, bilateral    . Foot surgery      BILATERAL  . Varicose vein surgery      vein stripping/lower extremities  . Hammer toe  surgery    . Back surgery     Family History  Problem Relation Age of Onset  . Diabetes Other     1st degree relative  . Heart disease Other   . Hypertension Other   . Prostate cancer Father   . Hypertension Father   . Cancer Father     prostate  . Prostate cancer Maternal Uncle   . Cancer Maternal Uncle     prostate  . Breast cancer Daughter   . Cancer Daughter     breast  . Colon cancer Neg Hx   . Heart disease Mother   . Diabetes Mother    History  Substance Use Topics  . Smoking status: Never Smoker   . Smokeless tobacco: Not on file  . Alcohol Use: No   OB History    No data available     Review of Systems  Constitutional: Negative for fever, chills, diaphoresis and fatigue.  HENT: Negative for congestion and sore throat.   Respiratory: Negative for cough.   Cardiovascular: Negative for chest pain.  Gastrointestinal: Negative for nausea, vomiting, abdominal pain, anorexia and change in bowel habit.  Musculoskeletal: Positive for joint swelling and arthralgias (Left wrist and nose). Negative for myalgias and neck pain.  Skin: Negative for rash.  Neurological: Negative for vertigo, weakness, numbness and headaches.  All other systems reviewed and are negative.  Allergies  Aspirin; Atenolol; Codeine sulfate; Hydrochlorothiazide; Ibuprofen; Lisinopril; Valsartan; and Verapamil  Home Medications   Prior to Admission medications   Medication Sig Start Date End Date Taking? Authorizing Provider  allopurinol (ZYLOPRIM) 300 MG tablet TAKE 1 TABLET DAILY 06/02/14   Aleksei Plotnikov V, MD  amLODipine (NORVASC) 5 MG tablet TAKE 1 TABLET (5 MG TOTAL) BY MOUTH DAILY. 12/03/14   Aleksei Plotnikov V, MD  aspirin 81 MG EC tablet Take 81 mg by mouth daily.      Historical Provider, MD  carvedilol (COREG) 25 MG tablet TAKE 1 TABLET (25 MG TOTAL) BY MOUTH 2 (TWO) TIMES DAILY WITH A MEAL. 07/12/14   Lew Dawes V, MD  Cholecalciferol (VITAMIN D3) 2000 UNITS capsule  Take 2,000 Units by mouth daily.      Historical Provider, MD  clonazePAM (KLONOPIN) 0.25 MG disintegrating tablet Take 1 tablet (0.25 mg total) by mouth 2 (two) times daily as needed (anxiety). 11/08/14   Aleksei Plotnikov V, MD  cyanocobalamin 2000 MCG tablet Take 2,000 mcg by mouth daily.    Historical Provider, MD  gabapentin (NEURONTIN) 100 MG capsule Take 1 capsule (100 mg total) by mouth 3 (three) times daily as needed. 04/07/14 04/07/15  Aleksei Plotnikov V, MD  metFORMIN (GLUCOPHAGE) 500 MG tablet Take 1 tablet (500 mg total) by mouth daily with breakfast. 07/12/14   Lew Dawes V, MD  pantoprazole (PROTONIX) 40 MG tablet Take 1 tablet (40 mg total) by mouth daily. 07/12/14   Aleksei Plotnikov V, MD  sertraline (ZOLOFT) 100 MG tablet TAKE ONE TABLET BY MOUTH ONCE DAILY 04/07/14   Lew Dawes V, MD  torsemide (DEMADEX) 20 MG tablet TAKE 1/2 TABLET (10MG ) BY MOUTH DAILY AS NEEDED FOR EDEMA (SWELLING) 07/12/14   Aleksei Plotnikov V, MD   BP 179/68 mmHg  Pulse 50  Temp(Src) 97.1 F (36.2 C) (Oral)  Resp 16  Ht 5\' 4"  (1.626 m)  Wt 150 lb (68.04 kg)  BMI 25.73 kg/m2  SpO2 100% Physical Exam  Constitutional: She is oriented to person, place, and time. She appears well-developed and well-nourished. No distress.  HENT:  Head: Normocephalic and atraumatic.    Abrasion to the nose from glasses bridge  Eyes: Conjunctivae and EOM are normal. Pupils are equal, round, and reactive to light. No scleral icterus.  No pain with eye movement. No signs of entrapment.  Neck: Normal range of motion.  Cardiovascular: Normal rate, regular rhythm and normal heart sounds.  Exam reveals no gallop and no friction rub.   No murmur heard. Pulmonary/Chest: Effort normal and breath sounds normal. No respiratory distress.  Abdominal: Soft. Bowel sounds are normal. She exhibits no distension and no mass. There is no tenderness. There is no guarding.  Musculoskeletal:  Left wrist with hematoma on the  dorsum. ROM limited due to pain. NVI  Neurological: She is alert and oriented to person, place, and time.  Skin: Skin is warm and dry. She is not diaphoretic.  Nursing note and vitals reviewed.   ED Course  Procedures (including critical care time) Labs Review Labs Reviewed - No data to display  Imaging Review Dg Wrist Complete Left  01/12/2015   CLINICAL DATA:  Fall with pain in the medial and lateral wrist. Initial encounter.  EXAM: LEFT WRIST - COMPLETE 3+ VIEW  COMPARISON:  05/01/2006  FINDINGS: There is no evidence of fracture or dislocation.  The bones appear osteopenic.  Mild for age degenerative spurring at the first [facility] and STT joints.  IMPRESSION:  No acute osseous findings.   Electronically Signed   By: Jorje Guild M.D.   On: 01/12/2015 23:11   Ct Head Wo Contrast  01/12/2015   CLINICAL DATA:  Fall with forehead injury and right cheek pain. Neck pain. Initial encounter.  EXAM: CT HEAD WITHOUT CONTRAST  CT CERVICAL SPINE WITHOUT CONTRAST  TECHNIQUE: Multidetector CT imaging of the head and cervical spine was performed following the standard protocol without intravenous contrast. Multiplanar CT image reconstructions of the cervical spine were also generated.  COMPARISON:  08/30/2009  FINDINGS: CT HEAD FINDINGS  Skull and Sinuses:Negative for fracture or destructive process. The mastoids, middle ears, and imaged paranasal sinuses are clear.  Orbits: Bilateral cataract resection.  No traumatic findings.  Brain: No evidence of acute infarction, hemorrhage, hydrocephalus, or mass lesion/mass effect. Remote inferior left cerebellar infarct, present since at least 2010.  CT CERVICAL SPINE FINDINGS  Negative for acute fracture or traumatic malalignment. No prevertebral edema. No gross cervical canal hematoma.  Degenerative disc disease present at C5-6 and C6-7. There is scattered facet arthropathy with spurring. Trace anterolisthesis present at C4-5, likely degenerative given associated facet  arthropathy.  Enlarged, multi nodular thyroid consistent with goiter.  IMPRESSION: 1. No evidence of acute intracranial or cervical spine injury. 2. Remote inferior left cerebellum infarct.   Electronically Signed   By: Jorje Guild M.D.   On: 01/12/2015 23:43   Ct Cervical Spine Wo Contrast  01/12/2015   CLINICAL DATA:  Fall with forehead injury and right cheek pain. Neck pain. Initial encounter.  EXAM: CT HEAD WITHOUT CONTRAST  CT CERVICAL SPINE WITHOUT CONTRAST  TECHNIQUE: Multidetector CT imaging of the head and cervical spine was performed following the standard protocol without intravenous contrast. Multiplanar CT image reconstructions of the cervical spine were also generated.  COMPARISON:  08/30/2009  FINDINGS: CT HEAD FINDINGS  Skull and Sinuses:Negative for fracture or destructive process. The mastoids, middle ears, and imaged paranasal sinuses are clear.  Orbits: Bilateral cataract resection.  No traumatic findings.  Brain: No evidence of acute infarction, hemorrhage, hydrocephalus, or mass lesion/mass effect. Remote inferior left cerebellar infarct, present since at least 2010.  CT CERVICAL SPINE FINDINGS  Negative for acute fracture or traumatic malalignment. No prevertebral edema. No gross cervical canal hematoma.  Degenerative disc disease present at C5-6 and C6-7. There is scattered facet arthropathy with spurring. Trace anterolisthesis present at C4-5, likely degenerative given associated facet arthropathy.  Enlarged, multi nodular thyroid consistent with goiter.  IMPRESSION: 1. No evidence of acute intracranial or cervical spine injury. 2. Remote inferior left cerebellum infarct.   Electronically Signed   By: Jorje Guild M.D.   On: 01/12/2015 23:43     EKG Interpretation None      MDM   Final diagnoses:  Fall, initial encounter  Abrasion  Traumatic hematoma of wrist, left, initial encounter    Patient with fall. Negative ct/xray. I personally reviewed the imaging tests  through PACS system. I have reviewed and interpreted Lab values. I reviewed available ER/hospitalization records through the [place] will be discharged with pain medicaiton, wrist brace. F/U with ortho.    Margarita Mail, PA-C 01/13/15 [phone]

## 2015-01-13 ENCOUNTER — Telehealth: Payer: Self-pay | Admitting: *Deleted

## 2015-01-13 DIAGNOSIS — Y92009 Unspecified place in unspecified non-institutional (private) residence as the place of occurrence of the external cause: Secondary | ICD-10-CM | POA: Insufficient documentation

## 2015-01-13 DIAGNOSIS — W19XXXA Unspecified fall, initial encounter: Secondary | ICD-10-CM | POA: Insufficient documentation

## 2015-01-13 MED ORDER — ONDANSETRON 4 MG PO TBDP: 4.0000 mg | ORAL_TABLET | Freq: Three times a day (TID) | ORAL | Status: DC | PRN

## 2015-01-13 MED ORDER — HYDROCODONE-ACETAMINOPHEN 5-325 MG PO TABS: 0.5000 | ORAL_TABLET | Freq: Four times a day (QID) | ORAL | Status: DC | PRN

## 2015-01-13 NOTE — Addendum Note (Signed)
Addended by: Cassandria Anger on: 01/13/2015 12:34 PM   Modules accepted: Level of Service

## 2015-01-13 NOTE — Assessment & Plan Note (Signed)
01/12/15 soft tissue contusions, mild abrasions Pt was re-assured and allowed to go home. I did not feel CT/X-rays were necessary at the moment. Finger wounds were dressed. Ice.  To ER if worse.Marland KitchenMarland Kitchen

## 2015-01-13 NOTE — Discharge Instructions (Signed)
Fall Prevention and Home Safety Falls cause injuries and can affect all age groups. It is possible to use preventive measures to significantly decrease the likelihood of falls. There are many simple measures which can make your home safer and prevent falls. OUTDOORS  Repair cracks and edges of walkways and driveways.  Remove high doorway thresholds.  Trim shrubbery on the main path into your home.  Have good outside lighting.  Clear walkways of tools, rocks, debris, and clutter.  Check that handrails are not broken and are securely fastened. Both sides of steps should have handrails.  Have leaves, snow, and ice cleared regularly.  Use sand or salt on walkways during winter months.  In the garage, clean up grease or oil spills. BATHROOM  Install night lights.  Install grab bars by the toilet and in the tub and shower.  Use non-skid mats or decals in the tub or shower.  Place a plastic non-slip stool in the shower to sit on, if needed.  Keep floors dry and clean up all water on the floor immediately.  Remove soap buildup in the tub or shower on a regular basis.  Secure bath mats with non-slip, double-sided rug tape.  Remove throw rugs and tripping hazards from the floors. BEDROOMS  Install night lights.  Make sure a bedside light is easy to reach.  Do not use oversized bedding.  Keep a telephone by your bedside.  Have a firm chair with side arms to use for getting dressed.  Remove throw rugs and tripping hazards from the floor. KITCHEN  Keep handles on pots and pans turned toward the center of the stove. Use back burners when possible.  Clean up spills quickly and allow time for drying.  Avoid walking on wet floors.  Avoid hot utensils and knives.  Position shelves so they are not too high or low.  Place commonly used objects within easy reach.  If necessary, use a sturdy step stool with a grab bar when reaching.  Keep electrical cables out of the  way.  Do not use floor polish or wax that makes floors slippery. If you must use wax, use non-skid floor wax.  Remove throw rugs and tripping hazards from the floor. STAIRWAYS  Never leave objects on stairs.  Place handrails on both sides of stairways and use them. Fix any loose handrails. Make sure handrails on both sides of the stairways are as long as the stairs.  Check carpeting to make sure it is firmly attached along stairs. Make repairs to worn or loose carpet promptly.  Avoid placing throw rugs at the top or bottom of stairways, or properly secure the rug with carpet tape to prevent slippage. Get rid of throw rugs, if possible.  Have an electrician put in a light switch at the top and bottom of the stairs. OTHER FALL PREVENTION TIPS  Wear low-heel or rubber-soled shoes that are supportive and fit well. Wear closed toe shoes.  When using a stepladder, make sure it is fully opened and both spreaders are firmly locked. Do not climb a closed stepladder.  Add color or contrast paint or tape to grab bars and handrails in your home. Place contrasting color strips on first and last steps.  Learn and use mobility aids as needed. Install an electrical emergency response system.  Turn on lights to avoid dark areas. Replace light bulbs that burn out immediately. Get light switches that glow.  Arrange furniture to create clear pathways. Keep furniture in the same place.  Firmly attach carpet with non-skid or double-sided tape.  Eliminate uneven floor surfaces.  Select a carpet pattern that does not visually hide the edge of steps.  Be aware of all pets. OTHER HOME SAFETY TIPS  Set the water temperature for 120 F (48.8 C).  Keep emergency numbers on or near the telephone.  Keep smoke detectors on every level of the home and near sleeping areas. Document Released: 11/30/2002 Document Revised: 06/10/2012 Document Reviewed: 02/29/2012 [facility] Patient Information 2015  [place], [place]. This information is not intended to replace advice given to you by your health care provider. Make sure you discuss any questions you have with your health care provider.  Hematoma A hematoma is a collection of blood under the skin, in an organ, in a body space, in a joint space, or in other tissue. The blood can clot to form a lump that you can see and feel. The lump is often firm and may sometimes become sore and tender. Most hematomas get better in a few days to weeks. However, some hematomas may be serious and require medical care. Hematomas can range in size from very small to very large. CAUSES  A hematoma can be caused by a blunt or penetrating injury. It can also be caused by spontaneous leakage from a blood vessel under the skin. Spontaneous leakage from a blood vessel is more likely to occur in older people, especially those taking blood thinners. Sometimes, a hematoma can develop after certain medical procedures. SIGNS AND SYMPTOMS   A firm lump on the body.  Possible pain and tenderness in the area.  Bruising.Blue, dark blue, purple-red, or yellowish skin may appear at the site of the hematoma if the hematoma is close to the surface of the skin. For hematomas in deeper tissues or body spaces, the signs and symptoms may be subtle. For example, an intra-abdominal hematoma may cause abdominal pain, weakness, fainting, and shortness of breath. An intracranial hematoma may cause a headache or symptoms such as weakness, trouble speaking, or a change in consciousness. DIAGNOSIS  A hematoma can usually be diagnosed based on your medical history and a physical exam. Imaging tests may be needed if your health care provider suspects a hematoma in deeper tissues or body spaces, such as the abdomen, head, or chest. These tests may include ultrasonography or a CT scan.  TREATMENT  Hematomas usually go away on their own over time. Rarely does the blood need to be drained out of the  body. Large hematomas or those that may affect vital organs will sometimes need surgical drainage or monitoring. HOME CARE INSTRUCTIONS   Apply ice to the injured area:   Put ice in a plastic bag.   Place a towel between your skin and the bag.   Leave the ice on for 20 minutes, 2-3 times a day for the first 1 to 2 days.   After the first 2 days, switch to using warm compresses on the hematoma.   Elevate the injured area to help decrease pain and swelling. Wrapping the area with an elastic bandage may also be helpful. Compression helps to reduce swelling and promotes shrinking of the hematoma. Make sure the bandage is not wrapped too tight.   If your hematoma is on a lower extremity and is painful, crutches may be helpful for a couple days.   Only take over-the-counter or prescription medicines as directed by your health care provider. SEEK IMMEDIATE MEDICAL CARE IF:   You have increasing pain, or your  pain is not controlled with medicine.   You have a fever.   You have worsening swelling or discoloration.   Your skin over the hematoma breaks or starts bleeding.   Your hematoma is in your chest or abdomen and you have weakness, shortness of breath, or a change in consciousness.  Your hematoma is on your scalp (caused by a fall or injury) and you have a worsening headache or a change in alertness or consciousness. MAKE SURE YOU:   Understand these instructions.  Will watch your condition.  Will get help right away if you are not doing well or get worse. Document Released: 07/24/2004 Document Revised: 08/12/2013 Document Reviewed: 05/20/2013 [facility] Patient Information 2015 [place], [place]. This information is not intended to replace advice given to you by your health care provider. Make sure you discuss any questions you have with your health care provider.  Abrasion An abrasion is a cut or scrape of the skin. Abrasions do not extend through all layers of the skin  and most heal within 10 days. It is important to care for your abrasion properly to prevent infection. CAUSES  Most abrasions are caused by falling on, or gliding across, the ground or other surface. When your skin rubs on something, the outer and inner layer of skin rubs off, causing an abrasion. DIAGNOSIS  Your caregiver will be able to diagnose an abrasion during a physical exam.  TREATMENT  Your treatment depends on how large and deep the abrasion is. Generally, your abrasion will be cleaned with water and a mild soap to remove any dirt or debris. An antibiotic ointment may be put over the abrasion to prevent an infection. A bandage (dressing) may be wrapped around the abrasion to keep it from getting dirty.  You may need a tetanus shot if:  You cannot remember when you had your last tetanus shot.  You have never had a tetanus shot.  The injury broke your skin. If you get a tetanus shot, your arm may swell, get red, and feel warm to the touch. This is common and not a problem. If you need a tetanus shot and you choose not to have one, there is a rare chance of getting tetanus. Sickness from tetanus can be serious.  HOME CARE INSTRUCTIONS   If a dressing was applied, change it at least once a day or as directed by your caregiver. If the bandage sticks, soak it off with warm water.   Wash the area with water and a mild soap to remove all the ointment 2 times a day. Rinse off the soap and pat the area dry with a clean towel.   Reapply any ointment as directed by your caregiver. This will help prevent infection and keep the bandage from sticking. Use gauze over the wound and under the dressing to help keep the bandage from sticking.   Change your dressing right away if it becomes wet or dirty.   Only take over-the-counter or prescription medicines for pain, discomfort, or fever as directed by your caregiver.   Follow up with your caregiver within 24-48 hours for a wound check, or as  directed. If you were not given a wound-check appointment, look closely at your abrasion for redness, swelling, or pus. These are signs of infection. SEEK IMMEDIATE MEDICAL CARE IF:   You have increasing pain in the wound.   You have redness, swelling, or tenderness around the wound.   You have pus coming from the wound.   You  have a fever or persistent symptoms for more than 2-3 days.  You have a fever and your symptoms suddenly get worse.  You have a bad smell coming from the wound or dressing.  MAKE SURE YOU:   Understand these instructions.  Will watch your condition.  Will get help right away if you are not doing well or get worse. Document Released: 09/19/2005 Document Revised: 11/26/2012 Document Reviewed: 11/13/2011 [facility] Patient Information 2015 [place], [place]. This information is not intended to replace advice given to you by your health care provider. Make sure you discuss any questions you have with your health care provider. RICE: Routine Care for Injuries The routine care of many injuries includes Rest, Ice, Compression, and Elevation (RICE). HOME CARE INSTRUCTIONS  Rest is needed to allow your body to heal. Routine activities can usually be resumed when comfortable. Injured tendons and bones can take up to 6 weeks to heal. Tendons are the cord-like structures that attach muscle to bone.  Ice following an injury helps keep the swelling down and reduces pain.  Put ice in a plastic bag.  Place a towel between your skin and the bag.  Leave the ice on for 15-20 minutes, 3-4 times a day, or as directed by your health care provider. Do this while awake, for the first 24 to 48 hours. After that, continue as directed by your caregiver.  Compression helps keep swelling down. It also gives support and helps with discomfort. If an elastic bandage has been applied, it should be removed and reapplied every 3 to 4 hours. It should not be applied tightly, but firmly enough  to keep swelling down. Watch fingers or toes for swelling, bluish discoloration, coldness, numbness, or excessive pain. If any of these problems occur, remove the bandage and reapply loosely. Contact your caregiver if these problems continue.  Elevation helps reduce swelling and decreases pain. With extremities, such as the arms, hands, legs, and feet, the injured area should be placed near or above the level of the heart, if possible. SEEK IMMEDIATE MEDICAL CARE IF:  You have persistent pain and swelling.  You develop redness, numbness, or unexpected weakness.  Your symptoms are getting worse rather than improving after several days. These symptoms may indicate that further evaluation or further X-rays are needed. Sometimes, X-rays may not show a small broken bone (fracture) until 1 week or 10 days later. Make a follow-up appointment with your caregiver. Ask when your X-ray results will be ready. Make sure you get your X-ray results. Document Released: 03/24/2001 Document Revised: 12/15/2013 Document Reviewed: 05/11/2011 [facility] Patient Information 2015 [place], [place]. This information is not intended to replace advice given to you by your health care provider. Make sure you discuss any questions you have with your health care provider.

## 2015-01-13 NOTE — Telephone Encounter (Signed)
[facility] Client [place] [facility] Patient Name: Breanna [name] Gender: Female DOB: 05/08/1954 Age: 81 Y 4 M 4 D Return Phone Number: [phone] (Primary) Address: [address]. City/State/Zip: [place] [address] Client [facility] - Client Client Site [facility] - Day Physician Cathlean Cower Contact Type Call Call Type Triage / Clinical Relationship To Patient Self Appointment Disposition EMR Appointment Not Necessary Return Phone Number [phone] (Primary) Chief Complaint CHEST PAIN (>=21 years) - pain, pressure, heaviness or tightness Initial Comment Caller states c/o chest and flank pain PreDisposition Call Doctor Nurse Assessment Nurse: Ronnald Ramp, RN, Miranda Date/Time (Eastern Time): 01/12/2015 12:52:52 PM Confirm and document reason for call. If symptomatic, describe symptoms. ---Caller states he is having left flank pain. Also with SOB. Symptoms started last night. Denies chest pain. Has the patient traveled out of the country within the last 30 days? ---Not Applicable Does the patient require triage? ---Yes Related visit to physician within the last 2 weeks? ---No Does the PT have any chronic conditions? (i.e. diabetes, asthma, etc.) ---Yes List chronic conditions. ---High Cholesterol, GERD, HTN, Asthma Guidelines Guideline Title Affirmed Question Affirmed Notes Nurse Date/Time Eilene Ghazi Time) Shoulder Pain Difficulty breathing or unusual sweating (e.g., sweating without exertion) Ronnald Ramp, RN, Miranda 01/12/2015 12:59:12 PM Disp. Time Eilene Ghazi Time) Disposition Final User 01/12/2015 12:47:31 PM Send to Urgent Rich Brave, Amy 01/12/2015 12:59:31 PM Go to ED Now Yes Ronnald Ramp, RN, Miranda PLEASE NOTE: All timestamps contained within this report are represented as [place] Standard Time. CONFIDENTIALTY NOTICE: This fax transmission is intended only for the addressee. It  contains information that is legally privileged, confidential or otherwise protected from use or disclosure. If you are not the intended recipient, you are strictly prohibited from reviewing, disclosing, copying using or disseminating any of this information or taking any action in reliance on or regarding this information. If you have received this fax in error, please notify us immediately by telephone so that we can arrange for its return to [place]. Phone: [phone], Toll-Free: [phone], Fax: [phone] Page: 2 of 2 Call Id: [id number] Caller Understands: Yes Disagree/Comply: Comply Care Advice Given Per Guideline GO TO ED NOW: You need to be seen in the Emergency Department. Go to the ER at ___________ [place] now. Drive carefully. NOTE TO TRIAGER - DRIVING: * Another adult should drive. * If immediate transportation is not available via car or taxi, then the patient should be instructed to call [id number]. CALL EMS IF: The patient passes out, starts acting confused or becomes to weak to stand. BRING MEDICINES: * Please bring a list of your current medicines when you go to the Emergency Department (ER). CARE ADVICE given per Shoulder Pain (Adult) guideline * It is also a good idea to bring the pill bottles too. This will help the doctor to make certain you are taking the right medicines and the right dose. After Care Instructions Given Call Event Type User Date / Time Description Comments User: Leverne Humbles, RN Date/Time Eilene Ghazi Time): 01/12/2015 1:02:32 PM During triage for flank pain, caller rated his pain as 7-8 out of 10, two hours after pain medication. Then he stated he had had left shoulder pain since last night along with the SOB. Referrals [facility] - ED

## 2015-02-20 ENCOUNTER — Other Ambulatory Visit: Payer: Self-pay | Admitting: Internal Medicine

## 2015-02-21 ENCOUNTER — Ambulatory Visit: Payer: PRIVATE HEALTH INSURANCE | Admitting: Internal Medicine

## 2015-04-13 ENCOUNTER — Ambulatory Visit (INDEPENDENT_AMBULATORY_CARE_PROVIDER_SITE_OTHER): Payer: Medicare Other | Admitting: Internal Medicine

## 2015-04-13 ENCOUNTER — Encounter: Payer: Self-pay | Admitting: Internal Medicine

## 2015-04-13 ENCOUNTER — Other Ambulatory Visit (INDEPENDENT_AMBULATORY_CARE_PROVIDER_SITE_OTHER): Payer: Medicare Other

## 2015-04-13 VITALS — BP 160/70 | HR 47 | Wt 150.0 lb

## 2015-04-13 DIAGNOSIS — E1129 Type 2 diabetes mellitus with other diabetic kidney complication: Secondary | ICD-10-CM

## 2015-04-13 DIAGNOSIS — M5440 Lumbago with sciatica, unspecified side: Secondary | ICD-10-CM

## 2015-04-13 DIAGNOSIS — N058 Unspecified nephritic syndrome with other morphologic changes: Secondary | ICD-10-CM

## 2015-04-13 DIAGNOSIS — I10 Essential (primary) hypertension: Secondary | ICD-10-CM

## 2015-04-13 LAB — HEPATIC FUNCTION PANEL
ALT: 8 U/L (ref 0–35)
AST: 14 U/L (ref 0–37)
Albumin: 3.9 g/dL (ref 3.5–5.2)
Alkaline Phosphatase: 101 U/L (ref 39–117)
Bilirubin, Direct: 0.1 mg/dL (ref 0.0–0.3)
Total Bilirubin: 0.3 mg/dL (ref 0.2–1.2)
Total Protein: 7.2 g/dL (ref 6.0–8.3)

## 2015-04-13 LAB — BASIC METABOLIC PANEL
BUN: 31 mg/dL — ABNORMAL HIGH (ref 6–23)
CO2: 27 mEq/L (ref 19–32)
Calcium: 9.3 mg/dL (ref 8.4–10.5)
Chloride: 107 mEq/L (ref 96–112)
Creatinine, Ser: 1.14 mg/dL (ref 0.40–1.20)
GFR: 58.74 mL/min — ABNORMAL LOW (ref >60.00)
Glucose, Bld: 108 mg/dL — ABNORMAL HIGH (ref 70–99)
Potassium: 4.6 mEq/L (ref 3.5–5.1)
Sodium: 140 mEq/L (ref 135–145)

## 2015-04-13 LAB — HEMOGLOBIN A1C: Hgb A1c MFr Bld: 6.3 % (ref 4.6–6.5)

## 2015-04-13 MED ORDER — LOSARTAN POTASSIUM 50 MG PO TABS: 50.0000 mg | ORAL_TABLET | Freq: Every day | ORAL | Status: DC

## 2015-04-13 NOTE — Assessment & Plan Note (Signed)
Chronic OA/spinal stenosis Dr Rolena Infante 2007 MRI: IMPRESSION: 1. Status post laminectomy and interbody fusion at L3-4 and L4-5 with good decompression of the thecal sac and foramina.  2. Interval progression of degenerative change at L2-3 with mild to moderate central canal narrowing now present due to disk bulging and ligamentum flavum thickening and facet arthropathy. The foramina are also narrowed.  3. New small far right protrusion at L1-2 with facet arthropathy causing right foraminal narrowing. Central spinal canal is only mildly narrowed at this level.  4. Redemonstration of advanced facet arthropathy at L5-S1.   Worse Stretch Consult Dr Rolena Infante

## 2015-04-13 NOTE — Progress Notes (Signed)
Pre visit review using our clinic review tool, if applicable. No additional management support is needed unless otherwise documented below in the visit note. 

## 2015-04-13 NOTE — Assessment & Plan Note (Signed)
Worse On Coreg, Amlodipine Added Losartan

## 2015-04-13 NOTE — Progress Notes (Signed)
   Subjective:    HPI  C/o stooping over when walking - new  F/u L>R knee pain, OA F/u LBP F/u grieving - coping ok    The patient presents for a follow-up of  chronic hypertension, chronic dyslipidemia, type 2 diabetes controlled with medicines   F/u - B leg tingling and pain  - better   Wt Readings from Last 3 Encounters:  04/13/15 150 lb (68.04 kg)  01/12/15 150 lb (68.04 kg)  10/19/14 147 lb (66.679 kg)   BP Readings from Last 3 Encounters:  04/13/15 160/70  01/12/15 180/90  01/12/15 160/80      Review of Systems  Constitutional: Negative for chills, activity change, appetite change, fatigue and unexpected weight change.  HENT: Negative for congestion, mouth sores and sinus pressure.   Eyes: Negative for visual disturbance.  Respiratory: Negative for chest tightness.   Genitourinary: Negative for frequency, difficulty urinating and vaginal pain.  Musculoskeletal: Positive for arthralgias and gait problem.  Skin: Negative for pallor.  Neurological: Negative for tremors.  Psychiatric/Behavioral: Negative for suicidal ideas, confusion and sleep disturbance. The patient is nervous/anxious.        Objective:   Physical Exam  Constitutional: She appears well-developed. No distress.  HENT:  Head: Normocephalic.  Left Ear: External ear normal.  Nose: Nose normal.  Mouth/Throat: Oropharynx is clear and moist.  Eyes: Conjunctivae are normal. Pupils are equal, round, and reactive to light. Right eye exhibits no discharge. Left eye exhibits no discharge.  Neck: Normal range of motion. Neck supple. No JVD present. No tracheal deviation present. No thyromegaly present.  Cardiovascular: Normal rate, regular rhythm and normal heart sounds.   Pulmonary/Chest: No stridor. No respiratory distress. She has no wheezes.  Abdominal: Soft. Bowel sounds are normal. She exhibits no distension and no mass. There is no tenderness. There is no rebound and no guarding.   Musculoskeletal: She exhibits tenderness (LS spine is tender w/ROM). She exhibits no edema.  R wrist and R ankle are tender  Lymphadenopathy:    She has no cervical adenopathy.  Neurological: She displays normal reflexes. No cranial nerve deficit. She exhibits normal muscle tone. Coordination normal.  Skin: No rash noted. No erythema.  Psychiatric: She has a normal mood and affect. Her behavior is normal. Judgment and thought content normal.  L knee is s/p THR - not swollen, NT Not sad   Lab Results  Component Value Date   WBC 6.0 01/04/2014   HGB 11.5* 01/04/2014   HCT 35.6* 01/04/2014   PLT 168.0 01/04/2014   GLUCOSE 107* 10/19/2014   CHOL 154 10/19/2014   TRIG 56.0 10/19/2014   HDL 61.10 10/19/2014   LDLCALC 82 10/19/2014   ALT 24 03/23/2013   AST 34 03/23/2013   NA 142 10/19/2014   K 3.9 10/19/2014   CL 107 10/19/2014   CREATININE 1.0 10/19/2014   BUN 20 10/19/2014   CO2 28 10/19/2014   TSH 0.44 10/06/2013   HGBA1C 6.3 10/19/2014             Assessment & Plan:

## 2015-04-13 NOTE — Assessment & Plan Note (Signed)
Labs

## 2015-05-02 ENCOUNTER — Telehealth: Payer: Self-pay | Admitting: Internal Medicine

## 2015-05-02 NOTE — Telephone Encounter (Signed)
Patient has a cough and is wheezing.  She does not want to see another provider.  She understands that he is out of the office this week.  His schedule is full for next week.  She wants Dr. Camila Li to be asked when he gets back into the office if he will work her in.

## 2015-05-16 ENCOUNTER — Other Ambulatory Visit: Payer: Self-pay | Admitting: Internal Medicine

## 2015-05-30 ENCOUNTER — Telehealth: Payer: Self-pay

## 2015-05-30 ENCOUNTER — Ambulatory Visit (INDEPENDENT_AMBULATORY_CARE_PROVIDER_SITE_OTHER): Payer: Medicare Other

## 2015-05-30 VITALS — BP 170/80 | HR 50 | Ht 62.0 in | Wt 149.5 lb

## 2015-05-30 DIAGNOSIS — M8580 Other specified disorders of bone density and structure, unspecified site: Secondary | ICD-10-CM

## 2015-05-30 DIAGNOSIS — Z0000 Encounter for general adult medical examination without abnormal findings: Secondary | ICD-10-CM

## 2015-05-30 NOTE — Patient Instructions (Signed)
  Breanna [name] , Thank you for taking time to come for your Medicare Wellness Visit. I appreciate your ongoing commitment to your health goals. Please review the following plan we discussed and let me know if I can assist you in the future.   Lifeline: [email or web address]/home; [phone] [phone]   Refer: Division for the blind [facility]: [place], [place], [place], [place], [place], [place], [place], [place], [place], [place], [place], [place] Phone: [phone] Toll Free: [phone] Fax: [phone] Email: [email or web address] Physical Address [address] [place], [place] [address] Transportation City: [place]  Phone:  [phone]  / transaid These are the goals we discussed: Goals    . divsion for the blind     Will contact the Div for the blind to assist the patient in independent living with vision loss Counties Served: [place], [place], [place], [place], [place], [place], [place], [place], [place], [place], [place], [place], [place], [place], [place] Phone: [phone] Toll Free: [phone] Fax: [phone] Email: [email or web address] Physical Address [address], [address] [place], [place] [address]     . transportation     Will explore transportation in [place] / for [employer]: [employer]:  [phone]  / transaid       This is a list of the screening recommended for you and due dates:  Health Maintenance  Topic Date Due  . Eye exam for diabetics  08/28/1943  . Urine Protein Check  [birth year]28[birth year]1943  . DEXA scan (bone density measurement)  08/27/1998  . Complete foot exam   07/30/2013  . Flu Shot  07/25/2015  . Hemoglobin A1C  10/13/2015  . Tetanus Vaccine  06/25/2017  . Colon Cancer Screening  01/09/2021  . Shingles Vaccine  Completed  . Pneumonia vaccines  Completed    Eye exam up to date Bone scan will  be scheduled Will have Nurse visit to check BP when she comes in for bone density

## 2015-05-30 NOTE — Telephone Encounter (Signed)
Call to division of the blind; Phone: [phone] and was told that Terrence Dupont would be the SW  And her number is [phone]  Call to [facility] and lvm for her to call the patient and review for safety in the home in lieu of recent vision loss. Opth; is Dr. Marilynne Halsted, M.D.  [phone]  States she "cannot see left eye and right eye vision is not good; stopped driving in august; manages meds but can't read; Needs education in adjusting to vision loss and independent living in home;   To call Back for further information but given the name and number of the patient with the patient's permission

## 2015-05-30 NOTE — Progress Notes (Addendum)
Subjective:   Breanna [name] is a 81 y.o. female who presents for Medicare Annual (Subsequent) preventive examination.  Review of Systems:     HRA assessment completed during visit;  Patient is here for Annual Wellness Assessment  The patient describes their health better, the same or worse than last year? Better Reviewed: BMI: 27.2  Diet: taking care of spouse who was DM and has watched diet;  Eats lots of fruit; salad; sometimes a baked potato; doesn't eat much bread Loves cereal;  Exercise/ did go to the [facility] pool and wants to get back with transportation Family Hx; spouse deceased in January 03, 2014; has 4 children plus one older dtr from spouse. Son helps Social history; more isolated from church and Y due to eye sight Use of tobacco; alcohol or illicit drugs Labs: W4Y;  Simple foot exam completed  Stopped driving; august;  Depressed: denies but understands she is limited Has disabled dtr; "[name]" is 53;  Plan; Her son is very attentive as he is the one "here"  Long term plan is to stay in home; so she tries to be careful; Verbalizes that her vision changes have been an issue. Children pay her bills  Refer: [facility] [place] Served: [place], [place], [place], [place], [place], [place], [place], [place], [place], [place], [place], [place], [place], [place], [place] Phone: [phone] Toll Free: [phone] Fax: [phone] Email: [email or web address] Physical Address [address], [place], [place] [address] Transportation City: [place]  Phone:  [phone]  / transaid Still manages meds x 2 weeks; Uses magnifying glass to pour meds [place] financially but has learned to manage   Psychosocial support; safe community; firearm safety; smoke alarms;  Caregiver to other?   Discussed Goal to improve health based on risk   Screenings; Bone density: will be scheduled today Colonoscopy; up to date Mammogram if  female/Oct 2016   Vision: next apt in July; Just had eye surgery;  Hearing: adequate Dental: Q 6 months  Gave information on safety to take home;   Current Care Team reviewed and updated         Objective:     Vitals: BP 170/80 mmHg  Pulse 50  Ht 5\' 2"  (1.575 m)  Wt 149 lb 8 oz (67.813 kg)  BMI 27.34 kg/m2  SpO2 93%  Tobacco History  Smoking status  . Never Smoker   Smokeless tobacco  . Not on file     Counseling given: Not Answered   Past Medical History  Diagnosis Date  . History of colon polyps   . Type II or unspecified type diabetes mellitus without mention of complication, not stated as uncontrolled   . Diverticulitis   . Gout   . HTN (hypertension)   . Osteoarthritis   . Hyperlipidemia   . Pulmonary HTN   . Hydronephrosis     LEFT/ Surgical intervention  . Adrenal adenoma 2006  . GERD (gastroesophageal reflux disease)   . Esophageal spasm 2011  . Boils 2009  . CVA (cerebral infarction) 2010    Cerebellar  . LBP (low back pain)   . Cholelithiasis     asympt. w/normal HIDA 03/2010  . Stress    Past Surgical History  Procedure Laterality Date  . Colectomy  2006    Sigmoid  . Abdominal hysterectomy    . Total knee arthroplasty  2003    LEFT  . Rotator cuff repair  2008    RIGHT  . Breast biopsy  RIGHT  . Cataract extraction, bilateral    . Foot surgery      BILATERAL  . Varicose vein surgery      vein stripping/lower extremities  . Hammer toe surgery    . Back surgery     Family History  Problem Relation Age of Onset  . Diabetes Other     1st degree relative  . Heart disease Other   . Hypertension Other   . Prostate cancer Father   . Hypertension Father   . Cancer Father     prostate  . Prostate cancer Maternal Uncle   . Cancer Maternal Uncle     prostate  . Breast cancer Daughter   . Cancer Daughter     breast  . Colon cancer Neg Hx   . Heart disease Mother   . Diabetes Mother    History  Sexual Activity  .  Sexual Activity: Not Currently    Outpatient Encounter Prescriptions as of 05/30/2015  Medication Sig  . allopurinol (ZYLOPRIM) 300 MG tablet TAKE 1 TABLET DAILY  . amLODipine (NORVASC) 5 MG tablet TAKE 1 TABLET (5 MG TOTAL) BY MOUTH DAILY.  Marland Kitchen aspirin 81 MG EC tablet Take 81 mg by mouth daily.    . brimonidine (ALPHAGAN) 0.15 % ophthalmic solution 1 drop.  . carvedilol (COREG) 25 MG tablet TAKE 1 TABLET (25 MG TOTAL) BY MOUTH 2 (TWO) TIMES DAILY WITH A MEAL.  Marland Kitchen Cholecalciferol (VITAMIN D3) 2000 UNITS capsule Take 2,000 Units by mouth daily.    . clonazePAM (KLONOPIN) 0.25 MG disintegrating tablet Take 1 tablet (0.25 mg total) by mouth 2 (two) times daily as needed (anxiety).  . cyanocobalamin 2000 MCG tablet Take 2,000 mcg by mouth daily.  . diphenhydramine-acetaminophen (TYLENOL PM) 25-500 MG TABS 1 tablet at bedtime as needed.  . dorzolamide-timolol (COSOPT) 22.3-6.8 MG/ML ophthalmic solution 1 drop.  Marland Kitchen gabapentin (NEURONTIN) 100 MG capsule TAKE 1 CAPSULE BY MOUTH 3 TIMES DAILY AS NEEDED  . HYDROcodone-acetaminophen (NORCO) 5-325 MG per tablet Take 0.5-1 tablets by mouth every 6 (six) hours as needed.  . latanoprost (XALATAN) 0.005 % ophthalmic solution 1 drop.  Marland Kitchen losartan (COZAAR) 50 MG tablet Take 1 tablet (50 mg total) by mouth daily.  . metFORMIN (GLUCOPHAGE) 500 MG tablet Take 1 tablet (500 mg total) by mouth daily with breakfast.  . ondansetron (ZOFRAN ODT) 4 MG disintegrating tablet Take 1 tablet (4 mg total) by mouth every 8 (eight) hours as needed for nausea or vomiting.  . pantoprazole (PROTONIX) 40 MG tablet Take 1 tablet (40 mg total) by mouth daily.  . prednisoLONE acetate (PRED FORTE) 1 % ophthalmic suspension 1 drop.  . sertraline (ZOLOFT) 100 MG tablet TAKE ONE TABLET BY MOUTH ONCE DAILY  . timolol (TIMOPTIC) 0.25 % ophthalmic solution   . torsemide (DEMADEX) 20 MG tablet TAKE 1/2 TABLET (10MG ) BY MOUTH DAILY AS NEEDED FOR EDEMA (SWELLING)   No facility-administered  encounter medications on file as of 05/30/2015.    Activities of Daily Living In your present state of health, do you have any difficulty performing the following activities: 05/30/2015  Hearing? N  Vision? Y  Difficulty concentrating or making decisions? N  Walking or climbing stairs? Y  Dressing or bathing? N  Doing errands, shopping? N  Preparing Food and eating ? N  Using the Toilet? N  In the past six months, have you accidently leaked urine? N  Do you have problems with loss of bowel control? N  Managing your Medications?  N  Managing your Finances? N  Housekeeping or managing your Housekeeping? N    Patient Care Team: Cassandria Anger, MD as PCP - General (Internal Medicine) Carolan Clines, MD as Consulting Physician (Urology) Melina Schools, MD as Consulting Physician (Orthopedic Surgery)    Assessment:    Objective:  Advanced Directives  Discussed risk for CVD and or  DM as appropriate  Reviewed  risk and lifestyle choices;   Functional status; Safety Issues identified Depression  Personalized Education given regarding:   Pt determined a personalized goal   Assessment included: smoking (secondary) educated as appropriate; including LDCT if appropriate Bone density scan as appropriate Calcium and Vit D as appropriate/ Osteoporosis risk reviewed Taking meds without issues; no barriers identified Stress: Recommendations for managing stress if assessed as a factor;  No Risk for hepatitis or high risk social behavior identified via hepatitis screen Educated on shingles and follow up with insurance company for co-pays or charges applied to Part D benefit.  Safety issues reviewed(driving issues; vision; home environment; support network and environmental safety; gun safety; smoke detectors) SAFETY Generalized Safety in the home reviewed/    Fall Risk and general Safety reviewed  Educated on prevention falls; Exercise, toning and strengthening; Balance  exercises  Comfortable shoes; Regular vision checks Home safety; removal of throw rugs; bathroom handrails; Unlevel surfaces outside; stumps;  Home Safety Tips for Older Adults given   Review Firearm safety as appropriate;  Smoke detectors; Assessed for community safety; Emergency Plan for illness or other Driving: hasn't driven since August 0100 Sun protection      Cognition assessed by AD8; Score 0 (A score of 2 or greater would indicate the MMSE be completed)    Need for Immunizations or other screenings identified;  (CDC recommmend Prevnar at 65 followed by pnuemovax 23 in one year or 5 years after the last dose.       Exercise Activities and Dietary recommendations    Goals    . divsion for the blind     Will contact the Div for the blind to assist the patient in independent living with vision loss Counties Served: , [place], [place], [place], [place], [place], [place], [place], [place], [place], [place], [place], [place], [place], [place] Phone: [phone] Toll Free: [phone] Fax: [phone] Email: [email or web address] Physical Address [address], [place] [address] [place], [place] [address]     . transportation     Will explore transportation in [place] / for [employer]: [employer]  Phone:  [phone]  / transaid      Fall Risk Fall Risk  05/30/2015 04/13/2015  Falls in the past year? Yes Yes  Number falls in past yr: 1 1  Injury with Fall? No No  Risk for fall due to : Impaired balance/gait;Impaired vision -   Depression Screen PHQ 2/9 Scores 05/30/2015 04/13/2015  PHQ - 2 Score 0 1  Exception Documentation Other- indicate reason in comment box -     Cognitive Testing MMSE - Mini Mental State Exam 05/30/2015  Not completed: Unable to complete    Immunization History  Administered Date(s) Administered  . H1N1 01/06/2009  . Influenza Split 10/01/2011, 09/08/2012  . Influenza Whole  10/07/2009, 09/29/2010  . Influenza, High Dose Seasonal PF 10/06/2013  . Influenza-Unspecified 10/09/2014  . Pneumococcal Conjugate-13 04/07/2014  . Pneumococcal Polysaccharide-23 06/26/2007  . Td 06/26/2007  . Zoster 10/04/2011   Screening Tests Health Maintenance  Topic Date Due  . OPHTHALMOLOGY EXAM  08/28/1943  . URINE MICROALBUMIN  08/28/1943  . DEXA SCAN  08/27/1998  . FOOT EXAM  07/30/2013  . INFLUENZA VACCINE  07/25/2015  . HEMOGLOBIN A1C  10/13/2015  . TETANUS/TDAP  06/25/2017  . COLONOSCOPY  01/09/2021  . ZOSTAVAX  Completed  . PNA vac Low Risk Adult  Completed      Plan:  Plan   The patient agrees to: Have bone density and come back to the office to have nurse check BP prior to visit on 7/20  Currently seeing ;Marilynne Halsted, M.D. 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 Losing some independence/ will refer to division for the blind to assist with home safety and independent living.  Also discussed long term planning for disabled dtr; Has HCPOA with son and dtr Sonia Baller    During the course of the visit the patient was educated and counseled about the following appropriate screening and preventive services:   Vaccines to include Pneumoccal, Influenza, Hepatitis B, Td, Zostavax, HCV/ up to date  Electrocardiogram ; deferred  Cardiovascular Disease/ Alc 6.3; is committed to diabetic diet for many years  Colorectal cancer screening; completed   Bone density screening/ to order  Diabetes screening; updated this year  Glaucoma screening/ongoing  [facility] in October 2016  Nutrition counseling / not necessary  Patient Instructions (the written plan) was given to the patient.   VOHYW,VPXTG, RN  05/30/2015   Medical screening examination/treatment/procedure(s) were performed by non-physician practitioner and as supervising physician I was immediately available for consultation/collaboration. I  agree with above. Walker Kehr, MD

## 2015-05-31 ENCOUNTER — Other Ambulatory Visit: Payer: Medicare Other

## 2015-06-15 ENCOUNTER — Ambulatory Visit (INDEPENDENT_AMBULATORY_CARE_PROVIDER_SITE_OTHER): Payer: Medicare Other | Admitting: *Deleted

## 2015-06-15 ENCOUNTER — Ambulatory Visit (INDEPENDENT_AMBULATORY_CARE_PROVIDER_SITE_OTHER)
Admission: RE | Admit: 2015-06-15 | Discharge: 2015-06-15 | Disposition: A | Discharge status: Home / Self Care | Payer: Medicare Other | Source: Ambulatory Visit | Attending: Internal Medicine | Admitting: Internal Medicine

## 2015-06-15 VITALS — BP 162/78

## 2015-06-15 DIAGNOSIS — I10 Essential (primary) hypertension: Secondary | ICD-10-CM

## 2015-06-15 DIAGNOSIS — Z79899 Other long term (current) drug therapy: Secondary | ICD-10-CM | POA: Diagnosis not present

## 2015-06-15 DIAGNOSIS — M8580 Other specified disorders of bone density and structure, unspecified site: Secondary | ICD-10-CM | POA: Diagnosis not present

## 2015-06-23 NOTE — Progress Notes (Signed)
Agree AP

## 2015-06-23 NOTE — Addendum Note (Signed)
Addended by: Cassandria Anger on: 06/23/2015 07:51 AM   Modules accepted: Miquel Dunn

## 2015-06-23 NOTE — Addendum Note (Signed)
Addended byWynetta Fines on: 06/23/2015 04:35 PM   Modules accepted: Level of Service

## 2015-07-06 ENCOUNTER — Telehealth: Payer: Self-pay

## 2015-07-06 NOTE — Telephone Encounter (Signed)
PLEASE NOTE: All timestamps contained within this report are represented as [place] Standard Time. CONFIDENTIALTY NOTICE: This fax transmission is intended only for the addressee. It contains information that is legally privileged, confidential or otherwise protected from use or disclosure. If you are not the intended recipient, you are strictly prohibited from reviewing, disclosing, copying using or disseminating any of this information or taking any action in reliance on or regarding this information. If you have received this fax in error, please notify us immediately by telephone so that we can arrange for its return to [place]. Phone: [phone], Toll-Free: [phone], Fax: [phone] Page: 1 of 1 Call Id: [id number] [place] Patient Name: Breanna [name] Gender: Female DOB: March 20, 1931 Age: 81 Y 9 M 27 D Return Phone Number: [phone] (Primary), [phone] (Secondary) Address: [address] City/State/Zip: [place] [address] Client [facility] - Client Client Site [place] - Day Physician [place], [place] Type Call Caller Name [facility] Phone Number NA Relationship To Patient Self Is this call to report lab results? No Call Type General Information Initial Comment Caller states she is a diabetic and she is out of gabapentin her legs are numb up to her knees. Needs a refill on her RX. General Information Type Call Transferred Nurse Assessment Guidelines Guideline Title Affirmed Question Affirmed Notes Nurse Date/Time (Eastern Time) Disp. Time Eilene Ghazi Time) Disposition Final User 06/24/2015 9:51:00 AM General Information Provided Yes [facility] After Care Instructions Given Call Event Type User Date / Time Description

## 2015-07-13 ENCOUNTER — Encounter: Payer: Self-pay | Admitting: Internal Medicine

## 2015-07-13 ENCOUNTER — Ambulatory Visit (INDEPENDENT_AMBULATORY_CARE_PROVIDER_SITE_OTHER): Payer: Medicare Other | Admitting: Internal Medicine

## 2015-07-13 VITALS — BP 172/80 | HR 51 | Ht 62.0 in | Wt 151.0 lb

## 2015-07-13 DIAGNOSIS — M810 Age-related osteoporosis without current pathological fracture: Secondary | ICD-10-CM | POA: Diagnosis not present

## 2015-07-13 DIAGNOSIS — N058 Unspecified nephritic syndrome with other morphologic changes: Secondary | ICD-10-CM

## 2015-07-13 DIAGNOSIS — M5440 Lumbago with sciatica, unspecified side: Secondary | ICD-10-CM

## 2015-07-13 DIAGNOSIS — R634 Abnormal weight loss: Secondary | ICD-10-CM | POA: Diagnosis not present

## 2015-07-13 DIAGNOSIS — E1129 Type 2 diabetes mellitus with other diabetic kidney complication: Secondary | ICD-10-CM | POA: Diagnosis not present

## 2015-07-13 MED ORDER — HYDROCODONE-ACETAMINOPHEN 5-325 MG PO TABS: 0.5000 | ORAL_TABLET | Freq: Four times a day (QID) | ORAL | Status: DC | PRN

## 2015-07-13 NOTE — Progress Notes (Signed)
Pre visit review using our clinic review tool, if applicable. No additional management support is needed unless otherwise documented below in the visit note. 

## 2015-07-13 NOTE — Progress Notes (Signed)
Subjective:  Patient ID: Breanna [name], female    DOB: 09-Mar-1931  Age: 81 y.o. MRN: [id number]  CC: No chief complaint on file.   HPI Breanna [name] presents for HTN, DM, osteoporosis, LBP  Outpatient Prescriptions Prior to Visit  Medication Sig Dispense Refill  . allopurinol (ZYLOPRIM) 300 MG tablet TAKE 1 TABLET DAILY 90 tablet 2  . amLODipine (NORVASC) 5 MG tablet TAKE 1 TABLET (5 MG TOTAL) BY MOUTH DAILY. 90 tablet 3  . aspirin 81 MG EC tablet Take 81 mg by mouth daily.      . brimonidine (ALPHAGAN) 0.15 % ophthalmic solution 1 drop.    . carvedilol (COREG) 25 MG tablet TAKE 1 TABLET (25 MG TOTAL) BY MOUTH 2 (TWO) TIMES DAILY WITH A MEAL. 180 tablet 3  . Cholecalciferol (VITAMIN D3) 2000 UNITS capsule Take 2,000 Units by mouth daily.      . clonazePAM (KLONOPIN) 0.25 MG disintegrating tablet Take 1 tablet (0.25 mg total) by mouth 2 (two) times daily as needed (anxiety). 60 tablet 1  . cyanocobalamin 2000 MCG tablet Take 2,000 mcg by mouth daily.    . diphenhydramine-acetaminophen (TYLENOL PM) 25-500 MG TABS 1 tablet at bedtime as needed.    . dorzolamide-timolol (COSOPT) 22.3-6.8 MG/ML ophthalmic solution 1 drop.    Marland Kitchen gabapentin (NEURONTIN) 100 MG capsule TAKE 1 CAPSULE BY MOUTH 3 TIMES DAILY AS NEEDED 270 capsule 1  . HYDROcodone-acetaminophen (NORCO) 5-325 MG per tablet Take 0.5-1 tablets by mouth every 6 (six) hours as needed. 6 tablet 0  . latanoprost (XALATAN) 0.005 % ophthalmic solution 1 drop.    Marland Kitchen losartan (COZAAR) 50 MG tablet Take 1 tablet (50 mg total) by mouth daily. 30 tablet 11  . metFORMIN (GLUCOPHAGE) 500 MG tablet Take 1 tablet (500 mg total) by mouth daily with breakfast. 30 tablet 11  . ondansetron (ZOFRAN ODT) 4 MG disintegrating tablet Take 1 tablet (4 mg total) by mouth every 8 (eight) hours as needed for nausea or vomiting. 10 tablet 0  . pantoprazole (PROTONIX) 40 MG tablet Take 1 tablet (40 mg total) by mouth daily. 90 tablet 3  . prednisoLONE acetate (PRED  FORTE) 1 % ophthalmic suspension 1 drop.    . sertraline (ZOLOFT) 100 MG tablet TAKE ONE TABLET BY MOUTH ONCE DAILY 90 tablet 2  . timolol (TIMOPTIC) 0.25 % ophthalmic solution     . torsemide (DEMADEX) 20 MG tablet TAKE 1/2 TABLET (10MG ) BY MOUTH DAILY AS NEEDED FOR EDEMA (SWELLING) 90 tablet 3   No facility-administered medications prior to visit.    ROS Review of Systems  Objective:  BP 172/80 mmHg  Pulse 51  Ht 5\' 2"  (1.575 m)  Wt 151 lb (68.493 kg)  BMI 27.61 kg/m2  SpO2 99%  BP Readings from Last 3 Encounters:  07/13/15 172/80  06/15/15 162/78  05/30/15 170/80    Wt Readings from Last 3 Encounters:  07/13/15 151 lb (68.493 kg)  05/30/15 149 lb 8 oz (67.813 kg)  04/13/15 150 lb (68.04 kg)    Physical Exam  Lab Results  Component Value Date   WBC 6.0 01/04/2014   HGB 11.5* 01/04/2014   HCT 35.6* 01/04/2014   PLT 168.0 01/04/2014   GLUCOSE 108* 04/13/2015   CHOL 154 10/19/2014   TRIG 56.0 10/19/2014   HDL 61.10 10/19/2014   LDLCALC 82 10/19/2014   ALT 8 04/13/2015   AST 14 04/13/2015   NA 140 04/13/2015   K 4.6 04/13/2015   CL 107  04/13/2015   CREATININE 1.14 04/13/2015   BUN 31* 04/13/2015   CO2 27 04/13/2015   TSH 0.44 10/06/2013   HGBA1C 6.3 04/13/2015    Dexascan  06/28/2015   Date of study: 06/15/2015 Exam: DUAL X-RAY ABSORPTIOMETRY (DXA) FOR BONE MINERAL DENSITY (BMD) Instrument: [employer] [facility] Provider: PCP Indication: follow up for low BMD Comparison: none (please note that it is not possible to compare data from  different instruments) Clinical data: Pt is a postmenopausal 81 y.o. female without previous h/o  fracture. On vitamin D.  Results:  Lumbar spine (L1-L2) Femoral neck (FN) Ultradistal radius  T-score  - 0.5 RFN: - 1.6 LFN: - 1.8 - 2.0    Assessment: the BMD is low according to the [facility] classification for  osteoporosis (see below). Fracture risk: moderate FRAX score: 10 year major osteoporotic risk: 6.8%. 10 year hip fracture   risk: 1.9%. These are under the thresholds for treatment of 20% and 3%,  respectively. Comments: the technical quality of the study is good, however, L3 and L4  vertebrae could not be analyzed due to previous back surgery and  instrumentation Evaluation for secondary causes should be considered if clinically  indicated.  Recommend optimizing calcium (1200 mg/day) and vitamin D (800 IU/day)  intake.  Followup: Repeat BMD is appropriate after 2 years or after 1-2 years if  starting treatment.  WHO criteria for diagnosis of osteoporosis in postmenopausal women and in  men 63 y/o or older:  - normal: T-score -1.0 to + 1.0 - osteopenia/low bone density: T-score between -2.5 and -1.0 - osteoporosis: T-score below -2.5 - severe osteoporosis: T-score below -2.5 with history of fragility  fracture Note: although not part of the WHO classification, the presence of a  fragility fracture, regardless of the T-score, should be considered  diagnostic of osteoporosis, provided other causes for the fracture have  been excluded.  Treatment: The National Osteoporosis Foundation recommends that treatment  be considered in postmenopausal women and men age 73 or older with: 1. Hip or vertebral (clinical or morphometric) fracture 2. T-score of - 2.5 or lower at the spine or hip 3. 10-year fracture probability by FRAX of at least 20% for a major  osteoporotic fracture and 3% for a hip fracture  Philemon Kingdom, MD [facility]       Assessment & Plan:   There are no diagnoses linked to this encounter. I am having Ms. Zeitz maintain her aspirin, Vitamin D3, cyanocobalamin, allopurinol, carvedilol, pantoprazole, torsemide, metFORMIN, clonazePAM, amLODipine, HYDROcodone-acetaminophen, ondansetron, sertraline, brimonidine, diphenhydramine-acetaminophen, dorzolamide-timolol, latanoprost, prednisoLONE acetate, timolol, losartan, and gabapentin.  No orders of the defined types were placed in this encounter.     Follow-up: No  Follow-up on file.  Walker Kehr, MD

## 2015-07-13 NOTE — Assessment & Plan Note (Addendum)
2016 Options discussed: cont w/Vit D and exercises Endocr ref offered

## 2015-07-13 NOTE — Assessment & Plan Note (Signed)
Wt Readings from Last 3 Encounters:  07/13/15 151 lb (68.493 kg)  05/30/15 149 lb 8 oz (67.813 kg)  04/13/15 150 lb (68.04 kg)

## 2015-07-13 NOTE — Assessment & Plan Note (Signed)
Norco prn  Potential benefits of a long term opioids use as well as potential risks (i.e. addiction risk, apnea etc) and complications (i.e. Somnolence, constipation and others) were explained to the patient and were aknowledged. 

## 2015-07-13 NOTE — Assessment & Plan Note (Signed)
On Metformin 

## 2015-07-30 ENCOUNTER — Other Ambulatory Visit: Payer: Self-pay | Admitting: Internal Medicine

## 2015-08-03 ENCOUNTER — Telehealth: Payer: Self-pay | Admitting: Internal Medicine

## 2015-08-03 NOTE — Telephone Encounter (Signed)
After pts bone scan they recommended her to take 800mg  of Vit D and 1200 mg of calcuium daily. She wants to be sure she should take these with the other vitamins she's taking Please advise pt at  [phone]

## 2015-08-03 NOTE — Telephone Encounter (Signed)
Called pt- # is busy. Will try again later.  

## 2015-08-04 NOTE — Telephone Encounter (Signed)
I called- I answered her questions and advised her to keep her ROV as scheduled with PCP or call us if she needs [place] sooner.

## 2015-09-23 ENCOUNTER — Other Ambulatory Visit: Payer: Self-pay | Admitting: Internal Medicine

## 2015-09-29 ENCOUNTER — Other Ambulatory Visit: Payer: Self-pay

## 2015-09-29 DIAGNOSIS — Z1231 Encounter for screening mammogram for malignant neoplasm of breast: Secondary | ICD-10-CM

## 2015-10-10 ENCOUNTER — Other Ambulatory Visit: Payer: Self-pay | Admitting: Internal Medicine

## 2015-10-13 ENCOUNTER — Ambulatory Visit (INDEPENDENT_AMBULATORY_CARE_PROVIDER_SITE_OTHER): Payer: Medicare Other | Admitting: Internal Medicine

## 2015-10-13 ENCOUNTER — Encounter: Payer: Self-pay | Admitting: Internal Medicine

## 2015-10-13 ENCOUNTER — Other Ambulatory Visit (INDEPENDENT_AMBULATORY_CARE_PROVIDER_SITE_OTHER): Payer: Medicare Other

## 2015-10-13 VITALS — BP 158/78 | HR 44 | Wt 155.0 lb

## 2015-10-13 DIAGNOSIS — M810 Age-related osteoporosis without current pathological fracture: Secondary | ICD-10-CM | POA: Diagnosis not present

## 2015-10-13 DIAGNOSIS — F4321 Adjustment disorder with depressed mood: Secondary | ICD-10-CM

## 2015-10-13 DIAGNOSIS — E1129 Type 2 diabetes mellitus with other diabetic kidney complication: Secondary | ICD-10-CM

## 2015-10-13 DIAGNOSIS — R634 Abnormal weight loss: Secondary | ICD-10-CM

## 2015-10-13 DIAGNOSIS — M5440 Lumbago with sciatica, unspecified side: Secondary | ICD-10-CM | POA: Diagnosis not present

## 2015-10-13 LAB — BASIC METABOLIC PANEL
BUN: 25 mg/dL — ABNORMAL HIGH (ref 6–23)
CO2: 28 mEq/L (ref 19–32)
Calcium: 9.1 mg/dL (ref 8.4–10.5)
Chloride: 106 mEq/L (ref 96–112)
Creatinine, Ser: 1.07 mg/dL (ref 0.40–1.20)
GFR: 63.12 mL/min (ref >60.00)
Glucose, Bld: 111 mg/dL — ABNORMAL HIGH (ref 70–99)
Potassium: 4.1 mEq/L (ref 3.5–5.1)
Sodium: 142 mEq/L (ref 135–145)

## 2015-10-13 LAB — HEPATIC FUNCTION PANEL
ALT: 9 U/L (ref 0–35)
AST: 14 U/L (ref 0–37)
Albumin: 3.6 g/dL (ref 3.5–5.2)
Alkaline Phosphatase: 86 U/L (ref 39–117)
Bilirubin, Direct: 0 mg/dL (ref 0.0–0.3)
Total Bilirubin: 0.3 mg/dL (ref 0.2–1.2)
Total Protein: 7.2 g/dL (ref 6.0–8.3)

## 2015-10-13 LAB — HEMOGLOBIN A1C: Hgb A1c MFr Bld: 6.4 % (ref 4.6–6.5)

## 2015-10-13 LAB — TSH: TSH: 0.54 u[IU]/mL (ref 0.35–4.50)

## 2015-10-13 MED ORDER — LORATADINE 10 MG PO TABS: 10.0000 mg | ORAL_TABLET | Freq: Every day | ORAL | Status: DC

## 2015-10-13 MED ORDER — METFORMIN HCL 500 MG PO TABS: 500.0000 mg | ORAL_TABLET | Freq: Every day | ORAL | Status: DC

## 2015-10-13 MED ORDER — BENZONATATE 200 MG PO CAPS: 200.0000 mg | ORAL_CAPSULE | Freq: Two times a day (BID) | ORAL | Status: DC | PRN

## 2015-10-13 MED ORDER — GABAPENTIN 100 MG PO CAPS: 100.0000 mg | ORAL_CAPSULE | Freq: Three times a day (TID) | ORAL | Status: DC

## 2015-10-13 MED ORDER — CLONAZEPAM 0.5 MG PO TABS: 0.5000 mg | ORAL_TABLET | Freq: Two times a day (BID) | ORAL | Status: DC | PRN

## 2015-10-13 MED ORDER — AMLODIPINE BESYLATE 5 MG PO TABS: 5.0000 mg | ORAL_TABLET | Freq: Every day | ORAL | Status: DC

## 2015-10-13 MED ORDER — TORSEMIDE 20 MG PO TABS: ORAL_TABLET | ORAL | Status: DC

## 2015-10-13 MED ORDER — CARVEDILOL 25 MG PO TABS: 25.0000 mg | ORAL_TABLET | Freq: Two times a day (BID) | ORAL | Status: DC

## 2015-10-13 MED ORDER — SERTRALINE HCL 100 MG PO TABS: 100.0000 mg | ORAL_TABLET | Freq: Every day | ORAL | Status: DC

## 2015-10-13 MED ORDER — ALLOPURINOL 300 MG PO TABS: 300.0000 mg | ORAL_TABLET | Freq: Every day | ORAL | Status: DC

## 2015-10-13 MED ORDER — LOSARTAN POTASSIUM 50 MG PO TABS: 50.0000 mg | ORAL_TABLET | Freq: Every day | ORAL | Status: DC

## 2015-10-13 MED ORDER — PANTOPRAZOLE SODIUM 40 MG PO TBEC: 40.0000 mg | DELAYED_RELEASE_TABLET | Freq: Every day | ORAL | Status: DC

## 2015-10-13 NOTE — Progress Notes (Signed)
Subjective:  Patient ID: Breanna [name], female    DOB: 1931/11/02  Age: 81 y.o. MRN: [id number]  CC: No chief complaint on file.   HPI Breanna [name] presents for DM, gout, HTN. Pt is grieving her dtr's death 18-Oct-2023.16. C/o cough  Outpatient Prescriptions Prior to Visit  Medication Sig Dispense Refill  . aspirin 81 MG EC tablet Take 81 mg by mouth daily.      . brimonidine (ALPHAGAN) 0.15 % ophthalmic solution 1 drop.    . Cholecalciferol (VITAMIN D3) 2000 UNITS capsule Take 2,000 Units by mouth daily.      . cyanocobalamin 2000 MCG tablet Take 2,000 mcg by mouth daily.    . diphenhydramine-acetaminophen (TYLENOL PM) 25-500 MG TABS 1 tablet at bedtime as needed.    . dorzolamide-timolol (COSOPT) 22.3-6.8 MG/ML ophthalmic solution 1 drop.    Marland Kitchen HYDROcodone-acetaminophen (NORCO) 5-325 MG per tablet Take 0.5-1 tablets by mouth every 6 (six) hours as needed. 60 tablet 0  . latanoprost (XALATAN) 0.005 % ophthalmic solution 1 drop.    . ondansetron (ZOFRAN ODT) 4 MG disintegrating tablet Take 1 tablet (4 mg total) by mouth every 8 (eight) hours as needed for nausea or vomiting. 10 tablet 0  . prednisoLONE acetate (PRED FORTE) 1 % ophthalmic suspension 1 drop.    . timolol (TIMOPTIC) 0.25 % ophthalmic solution     . allopurinol (ZYLOPRIM) 300 MG tablet TAKE 1 TABLET DAILY 90 tablet 2  . amLODipine (NORVASC) 5 MG tablet TAKE 1 TABLET (5 MG TOTAL) BY MOUTH DAILY. 90 tablet 3  . carvedilol (COREG) 25 MG tablet TAKE 1 TABLET (25 MG TOTAL) BY MOUTH 2 (TWO) TIMES DAILY WITH A MEAL. 180 tablet 2  . clonazePAM (KLONOPIN) 0.25 MG disintegrating tablet Take 1 tablet (0.25 mg total) by mouth 2 (two) times daily as needed (anxiety). 60 tablet 1  . gabapentin (NEURONTIN) 100 MG capsule TAKE 1 CAPSULE BY MOUTH 3 TIMES DAILY AS NEEDED 270 capsule 1  . losartan (COZAAR) 50 MG tablet Take 1 tablet (50 mg total) by mouth daily. 30 tablet 11  . metFORMIN (GLUCOPHAGE) 500 MG tablet TAKE 1 TABLET (500 MG TOTAL) BY MOUTH  DAILY WITH BREAKFAST. 30 tablet 11  . pantoprazole (PROTONIX) 40 MG tablet Take 1 tablet (40 mg total) by mouth daily. 90 tablet 3  . pantoprazole (PROTONIX) 40 MG tablet TAKE 1 TABLET BY MOUTH DAILY 90 tablet 3  . sertraline (ZOLOFT) 100 MG tablet TAKE ONE TABLET BY MOUTH ONCE DAILY 90 tablet 2  . torsemide (DEMADEX) 20 MG tablet TAKE 1/2 TABLET (10MG ) BY MOUTH DAILY AS NEEDED FOR EDEMA (SWELLING) 90 tablet 3   No facility-administered medications prior to visit.    ROS Review of Systems  Constitutional: Negative for chills, activity change, appetite change, fatigue and unexpected weight change.  HENT: Positive for postnasal drip and rhinorrhea. Negative for congestion, mouth sores and sinus pressure.   Eyes: Negative for visual disturbance.  Respiratory: Negative for cough and chest tightness.   Gastrointestinal: Negative for nausea and abdominal pain.  Genitourinary: Negative for frequency, difficulty urinating and vaginal pain.  Musculoskeletal: Negative for back pain and gait problem.  Skin: Negative for pallor and rash.  Neurological: Negative for dizziness, tremors, weakness, numbness and headaches.  Psychiatric/Behavioral: Positive for sleep disturbance. Negative for confusion and decreased concentration. The patient is nervous/anxious.     Objective:  BP 158/78 mmHg  Pulse 44  Wt 155 lb (70.308 kg)  SpO2 98%  BP Readings  from Last 3 Encounters:  10/13/15 158/78  07/13/15 172/80  06/15/15 162/78    Wt Readings from Last 3 Encounters:  10/13/15 155 lb (70.308 kg)  07/13/15 151 lb (68.493 kg)  05/30/15 149 lb 8 oz (67.813 kg)    Physical Exam  Constitutional: She appears well-developed. No distress.  HENT:  Head: Normocephalic.  Right Ear: External ear normal.  Left Ear: External ear normal.  Nose: Nose normal.  Mouth/Throat: Oropharynx is clear and moist.  Eyes: Conjunctivae are normal. Pupils are equal, round, and reactive to light. Right eye exhibits no  discharge. Left eye exhibits no discharge.  Neck: Normal range of motion. Neck supple. No JVD present. No tracheal deviation present. No thyromegaly present.  Cardiovascular: Normal rate, regular rhythm and normal heart sounds.   Pulmonary/Chest: No stridor. No respiratory distress. She has no wheezes.  Abdominal: Soft. Bowel sounds are normal. She exhibits no distension and no mass. There is no tenderness. There is no rebound and no guarding.  Musculoskeletal: She exhibits no edema or tenderness.  Lymphadenopathy:    She has no cervical adenopathy.  Neurological: She displays normal reflexes. No cranial nerve deficit. She exhibits normal muscle tone. Coordination normal.  Skin: No rash noted. No erythema.  Psychiatric: She has a normal mood and affect. Her behavior is normal. Judgment and thought content normal.    Lab Results  Component Value Date   WBC 6.0 01/04/2014   HGB 11.5* 01/04/2014   HCT 35.6* 01/04/2014   PLT 168.0 01/04/2014   GLUCOSE 111* 10/13/2015   CHOL 154 10/19/2014   TRIG 56.0 10/19/2014   HDL 61.10 10/19/2014   LDLCALC 82 10/19/2014   ALT 9 10/13/2015   AST 14 10/13/2015   NA 142 10/13/2015   K 4.1 10/13/2015   CL 106 10/13/2015   CREATININE 1.07 10/13/2015   BUN 25* 10/13/2015   CO2 28 10/13/2015   TSH 0.54 10/13/2015   HGBA1C 6.4 10/13/2015    Dexascan  06/28/2015  Date of study: 06/15/2015 Exam: DUAL X-RAY ABSORPTIOMETRY (DXA) FOR BONE MINERAL DENSITY (BMD) Instrument: [employer] Provider: PCP Indication: follow up for low BMD Comparison: none (please note that it is not possible to compare data from different instruments) Clinical data: Pt is a postmenopausal 81 y.o. female without previous h/o fracture. On vitamin D. Results:  Lumbar spine (L1-L2) Femoral neck (FN) Ultradistal radius T-score  - 0.5 RFN: - 1.6 LFN: - 1.8 - 2.0 Assessment: the BMD is low according to the [facility] classification for osteoporosis (see below). Fracture risk:  moderate FRAX score: 10 year major osteoporotic risk: 6.8%. 10 year hip fracture risk: 1.9%. These are under the thresholds for treatment of 20% and 3%, respectively. Comments: the technical quality of the study is good, however, L3 and L4 vertebrae could not be analyzed due to previous back surgery and instrumentation Evaluation for secondary causes should be considered if clinically indicated. Recommend optimizing calcium (1200 mg/day) and vitamin D (800 IU/day) intake. Followup: Repeat BMD is appropriate after 2 years or after 1-2 years if starting treatment. WHO criteria for diagnosis of osteoporosis in postmenopausal women and in men 67 y/o or older: - normal: T-score -1.0 to + 1.0 - osteopenia/low bone density: T-score between -2.5 and -1.0 - osteoporosis: T-score below -2.5 - severe osteoporosis: T-score below -2.5 with history of fragility fracture Note: although not part of the WHO classification, the presence of a fragility fracture, regardless of the T-score, should be considered diagnostic of osteoporosis, provided  other causes for the fracture have been excluded. Treatment: The National Osteoporosis Foundation recommends that treatment be considered in postmenopausal women and men age 58 or older with: 1. Hip or vertebral (clinical or morphometric) fracture 2. T-score of - 2.5 or lower at the spine or hip 3. 10-year fracture probability by FRAX of at least 20% for a major osteoporotic fracture and 3% for a hip fracture Philemon Kingdom, MD [facility]    Assessment & Plan:   Diagnoses and all orders for this visit:  Grief  Other orders -     allopurinol (ZYLOPRIM) 300 MG tablet; Take 1 tablet (300 mg total) by mouth daily. -     amLODipine (NORVASC) 5 MG tablet; Take 1 tablet (5 mg total) by mouth daily. -     gabapentin (NEURONTIN) 100 MG capsule; Take 1 capsule (100 mg total) by mouth 3 (three) times daily. -     losartan (COZAAR) 50 MG tablet; Take 1 tablet (50 mg total) by  mouth daily. -     metFORMIN (GLUCOPHAGE) 500 MG tablet; Take 1 tablet (500 mg total) by mouth daily with breakfast. -     sertraline (ZOLOFT) 100 MG tablet; Take 1 tablet (100 mg total) by mouth daily. -     torsemide (DEMADEX) 20 MG tablet; TAKE 1/2 TABLET (10MG ) BY MOUTH DAILY AS NEEDED FOR EDEMA (SWELLING) -     pantoprazole (PROTONIX) 40 MG tablet; Take 1 tablet (40 mg total) by mouth daily. -     carvedilol (COREG) 25 MG tablet; Take 1 tablet (25 mg total) by mouth 2 (two) times daily with a meal. -     benzonatate (TESSALON) 200 MG capsule; Take 1 capsule (200 mg total) by mouth 2 (two) times daily as needed for cough. -     clonazePAM (KLONOPIN) 0.5 MG tablet; Take 1 tablet (0.5 mg total) by mouth 2 (two) times daily as needed for anxiety. -     loratadine (CLARITIN) 10 MG tablet; Take 1 tablet (10 mg total) by mouth daily.  I have discontinued Breanna [name]'s pantoprazole and clonazePAM. I have also changed her allopurinol, gabapentin, sertraline, and pantoprazole. Additionally, I am having her start on benzonatate, clonazePAM, and loratadine. Lastly, I am having her maintain her aspirin, Vitamin D3, cyanocobalamin, ondansetron, brimonidine, diphenhydramine-acetaminophen, dorzolamide-timolol, latanoprost, prednisoLONE acetate, timolol, HYDROcodone-acetaminophen, CALCIUM PO, amLODipine, losartan, metFORMIN, torsemide, and carvedilol.  Meds ordered this encounter  Medications  . CALCIUM PO    Sig: Take by mouth daily.  Marland Kitchen allopurinol (ZYLOPRIM) 300 MG tablet    Sig: Take 1 tablet (300 mg total) by mouth daily.    Dispense:  90 tablet    Refill:  3  . amLODipine (NORVASC) 5 MG tablet    Sig: Take 1 tablet (5 mg total) by mouth daily.    Dispense:  90 tablet    Refill:  3  . gabapentin (NEURONTIN) 100 MG capsule    Sig: Take 1 capsule (100 mg total) by mouth 3 (three) times daily.    Dispense:  270 capsule    Refill:  3  . losartan (COZAAR) 50 MG tablet    Sig: Take 1 tablet (50 mg total)  by mouth daily.    Dispense:  90 tablet    Refill:  3  . metFORMIN (GLUCOPHAGE) 500 MG tablet    Sig: Take 1 tablet (500 mg total) by mouth daily with breakfast.    Dispense:  90 tablet    Refill:  3  .  sertraline (ZOLOFT) 100 MG tablet    Sig: Take 1 tablet (100 mg total) by mouth daily.    Dispense:  90 tablet    Refill:  3  . torsemide (DEMADEX) 20 MG tablet    Sig: TAKE 1/2 TABLET (10MG ) BY MOUTH DAILY AS NEEDED FOR EDEMA (SWELLING)    Dispense:  90 tablet    Refill:  3  . pantoprazole (PROTONIX) 40 MG tablet    Sig: Take 1 tablet (40 mg total) by mouth daily.    Dispense:  90 tablet    Refill:  3  . carvedilol (COREG) 25 MG tablet    Sig: Take 1 tablet (25 mg total) by mouth 2 (two) times daily with a meal.    Dispense:  180 tablet    Refill:  3  . benzonatate (TESSALON) 200 MG capsule    Sig: Take 1 capsule (200 mg total) by mouth 2 (two) times daily as needed for cough.    Dispense:  60 capsule    Refill:  0  . clonazePAM (KLONOPIN) 0.5 MG tablet    Sig: Take 1 tablet (0.5 mg total) by mouth 2 (two) times daily as needed for anxiety.    Dispense:  60 tablet    Refill:  2  . loratadine (CLARITIN) 10 MG tablet    Sig: Take 1 tablet (10 mg total) by mouth daily.    Dispense:  100 tablet    Refill:  3     Follow-up: Return in about 3 months (around 01/13/2016) for a follow-up visit.  Walker Kehr, MD

## 2015-10-13 NOTE — Assessment & Plan Note (Signed)
Pt is grieving her dtr's death 2023/10/23.16. Clonazepam prn

## 2015-10-13 NOTE — Progress Notes (Signed)
Pre visit review using our clinic review tool, if applicable. No additional management support is needed unless otherwise documented below in the visit note. 

## 2015-11-03 ENCOUNTER — Ambulatory Visit
Admission: RE | Admit: 2015-11-03 | Discharge: 2015-11-03 | Disposition: A | Discharge status: Home / Self Care | Payer: Medicare Other | Source: Ambulatory Visit

## 2015-11-03 DIAGNOSIS — Z1231 Encounter for screening mammogram for malignant neoplasm of breast: Secondary | ICD-10-CM

## 2015-11-08 ENCOUNTER — Other Ambulatory Visit: Payer: Self-pay | Admitting: Internal Medicine

## 2015-11-08 DIAGNOSIS — R928 Other abnormal and inconclusive findings on diagnostic imaging of breast: Secondary | ICD-10-CM

## 2015-11-14 ENCOUNTER — Telehealth: Payer: Self-pay | Admitting: Internal Medicine

## 2015-11-14 ENCOUNTER — Other Ambulatory Visit: Payer: Medicare Other

## 2015-11-14 NOTE — Telephone Encounter (Signed)
It was done on 11/08/15 Thx

## 2015-11-14 NOTE — Telephone Encounter (Signed)
Pt called and needs Dr Camila Li to put in a referral for her 2nd Mammogram as soon as possible .    Can a referral be put in for this pt?

## 2015-11-15 NOTE — Telephone Encounter (Signed)
Notified pt with md response.../lmb 

## 2015-11-24 ENCOUNTER — Ambulatory Visit
Admission: RE | Admit: 2015-11-24 | Discharge: 2015-11-24 | Disposition: A | Discharge status: Home / Self Care | Payer: Medicare Other | Source: Ambulatory Visit | Attending: Internal Medicine | Admitting: Internal Medicine

## 2015-11-24 DIAGNOSIS — R928 Other abnormal and inconclusive findings on diagnostic imaging of breast: Secondary | ICD-10-CM

## 2016-01-10 ENCOUNTER — Encounter: Payer: Self-pay | Admitting: Internal Medicine

## 2016-01-10 ENCOUNTER — Ambulatory Visit (INDEPENDENT_AMBULATORY_CARE_PROVIDER_SITE_OTHER): Payer: Medicare Other | Admitting: Internal Medicine

## 2016-01-10 ENCOUNTER — Other Ambulatory Visit (INDEPENDENT_AMBULATORY_CARE_PROVIDER_SITE_OTHER): Payer: Medicare Other

## 2016-01-10 VITALS — BP 170/86 | HR 72 | Wt 158.0 lb

## 2016-01-10 DIAGNOSIS — N183 Chronic kidney disease, stage 3 unspecified: Secondary | ICD-10-CM

## 2016-01-10 DIAGNOSIS — R195 Other fecal abnormalities: Secondary | ICD-10-CM

## 2016-01-10 DIAGNOSIS — E1122 Type 2 diabetes mellitus with diabetic chronic kidney disease: Secondary | ICD-10-CM | POA: Diagnosis not present

## 2016-01-10 DIAGNOSIS — M153 Secondary multiple arthritis: Secondary | ICD-10-CM

## 2016-01-10 LAB — IBC PANEL
Iron: 85 ug/dL (ref 42–145)
Saturation Ratios: 20.7 % (ref 20.0–50.0)
Transferrin: 294 mg/dL (ref 212.0–360.0)

## 2016-01-10 LAB — BASIC METABOLIC PANEL
BUN: 21 mg/dL (ref 6–23)
CO2: 27 mEq/L (ref 19–32)
Calcium: 9.6 mg/dL (ref 8.4–10.5)
Chloride: 107 mEq/L (ref 96–112)
Creatinine, Ser: 0.87 mg/dL (ref 0.40–1.20)
GFR: 80.09 mL/min (ref >60.00)
Glucose, Bld: 103 mg/dL — ABNORMAL HIGH (ref 70–99)
Potassium: 4.2 mEq/L (ref 3.5–5.1)
Sodium: 142 mEq/L (ref 135–145)

## 2016-01-10 LAB — CBC WITH DIFFERENTIAL/PLATELET
Basophils Absolute: 0 10*3/uL (ref 0.0–0.1)
Basophils Relative: 0.4 % (ref 0.0–3.0)
Eosinophils Absolute: 0.2 10*3/uL (ref 0.0–0.7)
Eosinophils Relative: 3.1 % (ref 0.0–5.0)
HCT: 35.8 % — ABNORMAL LOW (ref 36.0–46.0)
Hemoglobin: 11.4 g/dL — ABNORMAL LOW (ref 12.0–15.0)
Lymphocytes Relative: 19.9 % (ref 12.0–46.0)
Lymphs Abs: 1.1 10*3/uL (ref 0.7–4.0)
MCHC: 31.8 g/dL (ref 30.0–36.0)
MCV: 88.1 fl (ref 78.0–100.0)
Monocytes Absolute: 0.5 10*3/uL (ref 0.1–1.0)
Monocytes Relative: 8.7 % (ref 3.0–12.0)
Neutro Abs: 3.8 10*3/uL (ref 1.4–7.7)
Neutrophils Relative %: 67.9 % (ref 43.0–77.0)
Platelets: 164 10*3/uL (ref 150.0–400.0)
RBC: 4.06 Mil/uL (ref 3.87–5.11)
RDW: 18 % — ABNORMAL HIGH (ref 11.5–15.5)
WBC: 5.6 10*3/uL (ref 4.0–10.5)

## 2016-01-10 LAB — HEMOGLOBIN A1C: Hgb A1c MFr Bld: 6.3 % (ref 4.6–6.5)

## 2016-01-10 NOTE — Patient Instructions (Signed)
Metformin - hold for a few days to see if gas is better

## 2016-01-10 NOTE — Assessment & Plan Note (Signed)
Tylenol pm prn

## 2016-01-10 NOTE — Progress Notes (Signed)
Subjective:  Patient ID: Breanna [name], female    DOB: 10/26/1931  Age: 82 y.o. MRN: [id number]  CC: No chief complaint on file.   HPI Breanna [name] presents for HTN, OA, GERD. C/o gas, dark stools at times. Using Lactaid.  Outpatient Prescriptions Prior to Visit  Medication Sig Dispense Refill  . allopurinol (ZYLOPRIM) 300 MG tablet Take 1 tablet (300 mg total) by mouth daily. 90 tablet 3  . amLODipine (NORVASC) 5 MG tablet Take 1 tablet (5 mg total) by mouth daily. 90 tablet 3  . aspirin 81 MG EC tablet Take 81 mg by mouth daily.      . benzonatate (TESSALON) 200 MG capsule Take 1 capsule (200 mg total) by mouth 2 (two) times daily as needed for cough. 60 capsule 0  . brimonidine (ALPHAGAN) 0.15 % ophthalmic solution 1 drop.    Marland Kitchen CALCIUM PO Take by mouth daily.    . carvedilol (COREG) 25 MG tablet Take 1 tablet (25 mg total) by mouth 2 (two) times daily with a meal. 180 tablet 3  . Cholecalciferol (VITAMIN D3) 2000 UNITS capsule Take 2,000 Units by mouth daily.      . clonazePAM (KLONOPIN) 0.5 MG tablet Take 1 tablet (0.5 mg total) by mouth 2 (two) times daily as needed for anxiety. 60 tablet 2  . cyanocobalamin 2000 MCG tablet Take 2,000 mcg by mouth daily.    . diphenhydramine-acetaminophen (TYLENOL PM) 25-500 MG TABS 1 tablet at bedtime as needed.    . dorzolamide-timolol (COSOPT) 22.3-6.8 MG/ML ophthalmic solution 1 drop.    Marland Kitchen gabapentin (NEURONTIN) 100 MG capsule Take 1 capsule (100 mg total) by mouth 3 (three) times daily. 270 capsule 3  . HYDROcodone-acetaminophen (NORCO) 5-325 MG per tablet Take 0.5-1 tablets by mouth every 6 (six) hours as needed. 60 tablet 0  . latanoprost (XALATAN) 0.005 % ophthalmic solution 1 drop.    Marland Kitchen loratadine (CLARITIN) 10 MG tablet Take 1 tablet (10 mg total) by mouth daily. 100 tablet 3  . losartan (COZAAR) 50 MG tablet Take 1 tablet (50 mg total) by mouth daily. 90 tablet 3  . metFORMIN (GLUCOPHAGE) 500 MG tablet Take 1 tablet (500 mg total) by mouth  daily with breakfast. 90 tablet 3  . ondansetron (ZOFRAN ODT) 4 MG disintegrating tablet Take 1 tablet (4 mg total) by mouth every 8 (eight) hours as needed for nausea or vomiting. 10 tablet 0  . pantoprazole (PROTONIX) 40 MG tablet Take 1 tablet (40 mg total) by mouth daily. 90 tablet 3  . prednisoLONE acetate (PRED FORTE) 1 % ophthalmic suspension 1 drop.    . sertraline (ZOLOFT) 100 MG tablet Take 1 tablet (100 mg total) by mouth daily. 90 tablet 3  . timolol (TIMOPTIC) 0.25 % ophthalmic solution     . torsemide (DEMADEX) 20 MG tablet TAKE 1/2 TABLET (10MG ) BY MOUTH DAILY AS NEEDED FOR EDEMA (SWELLING) 90 tablet 3   No facility-administered medications prior to visit.    ROS Review of Systems  Constitutional: Negative for chills, activity change, appetite change, fatigue and unexpected weight change.  HENT: Negative for congestion, mouth sores and sinus pressure.   Eyes: Negative for visual disturbance.  Respiratory: Negative for cough and chest tightness.   Gastrointestinal: Positive for abdominal distention. Negative for nausea, vomiting, abdominal pain, diarrhea and blood in stool.  Genitourinary: Negative for frequency, difficulty urinating and vaginal pain.  Musculoskeletal: Negative for back pain and gait problem.  Skin: Negative for pallor and rash.  Neurological: Negative for dizziness, tremors, weakness, numbness and headaches.  Psychiatric/Behavioral: Negative for confusion and sleep disturbance.    Objective:  BP 170/86 mmHg  Pulse 72  Wt 158 lb (71.668 kg)  SpO2 98%  BP Readings from Last 3 Encounters:  01/10/16 170/86  10/13/15 158/78  07/13/15 172/80    Wt Readings from Last 3 Encounters:  01/10/16 158 lb (71.668 kg)  10/13/15 155 lb (70.308 kg)  07/13/15 151 lb (68.493 kg)    Physical Exam  Constitutional: She appears well-developed. No distress.  HENT:  Head: Normocephalic.  Right Ear: External ear normal.  Left Ear: External ear normal.  Nose: Nose  normal.  Mouth/Throat: Oropharynx is clear and moist.  Eyes: Conjunctivae are normal. Pupils are equal, round, and reactive to light. Right eye exhibits no discharge. Left eye exhibits no discharge.  Neck: Normal range of motion. Neck supple. No JVD present. No tracheal deviation present. No thyromegaly present.  Cardiovascular: Normal rate, regular rhythm and normal heart sounds.   Pulmonary/Chest: No stridor. No respiratory distress. She has no wheezes.  Abdominal: Soft. Bowel sounds are normal. She exhibits no distension and no mass. There is no tenderness. There is no rebound and no guarding.  Musculoskeletal: She exhibits tenderness. She exhibits no edema.  Lymphadenopathy:    She has no cervical adenopathy.  Neurological: She displays normal reflexes. No cranial nerve deficit. She exhibits normal muscle tone. Coordination abnormal.  Skin: No rash noted. No erythema.  Psychiatric: She has a normal mood and affect. Her behavior is normal. Judgment and thought content normal.  Cane  Lab Results  Component Value Date   WBC 6.0 01/04/2014   HGB 11.5* 01/04/2014   HCT 35.6* 01/04/2014   PLT 168.0 01/04/2014   GLUCOSE 111* 10/13/2015   CHOL 154 10/19/2014   TRIG 56.0 10/19/2014   HDL 61.10 10/19/2014   LDLCALC 82 10/19/2014   ALT 9 10/13/2015   AST 14 10/13/2015   NA 142 10/13/2015   K 4.1 10/13/2015   CL 106 10/13/2015   CREATININE 1.07 10/13/2015   BUN 25* 10/13/2015   CO2 28 10/13/2015   TSH 0.54 10/13/2015   HGBA1C 6.4 10/13/2015    Mm Diag Breast Tomo Uni Left  11/24/2015  CLINICAL DATA:  Patient returns today to evaluate a possible left breast asymmetry identified on recent screening mammogram. EXAM: DIGITAL DIAGNOSTIC LEFT MAMMOGRAM WITH 3D TOMOSYNTHESIS AND CAD COMPARISON:  Previous exams including recent screening mammogram dated 11/03/2015. ACR Breast Density Category b: There are scattered areas of fibroglandular density. FINDINGS: On today's additional views with  spot compression and tomosynthesis, there is no persistent asymmetry within the left breast indicating superimposition of normal dense fibroglandular tissues. No dominant masses, suspicious calcifications or secondary signs of malignancy are identified within the left breast. Mammographic images were processed with CAD. IMPRESSION: No mammographic evidence of malignancy within the left breast. Patient may return to routine annual bilateral screening mammogram schedule. RECOMMENDATION: Screening mammogram in one year.(Code:SM-B-01Y) I have discussed the findings and recommendations with the patient. Results were also provided in writing at the conclusion of the visit. If applicable, a reminder letter will be sent to the patient regarding the next appointment. BI-RADS CATEGORY  1: Negative. Electronically Signed   By: Franki Cabot M.D.   On: 11/24/2015 09:27    Assessment & Plan:   There are no diagnoses linked to this encounter. I am having Ms. Shad maintain her aspirin, Vitamin D3, cyanocobalamin, ondansetron, brimonidine, diphenhydramine-acetaminophen, dorzolamide-timolol, latanoprost, prednisoLONE  acetate, timolol, HYDROcodone-acetaminophen, CALCIUM PO, allopurinol, amLODipine, gabapentin, losartan, metFORMIN, sertraline, torsemide, pantoprazole, carvedilol, benzonatate, clonazePAM, and loratadine.  No orders of the defined types were placed in this encounter.     Follow-up: No Follow-up on file.  Walker Kehr, MD

## 2016-01-10 NOTE — Assessment & Plan Note (Signed)
Chronic Metformin - hold for a few days to see if gas is better

## 2016-01-10 NOTE — Progress Notes (Signed)
Pre visit review using our clinic review tool, if applicable. No additional management support is needed unless otherwise documented below in the visit note. 

## 2016-01-10 NOTE — Assessment & Plan Note (Signed)
1/17 chronic >12 mo off and on This could be related to leafy vegetables CBC GI ref if needed

## 2016-02-17 ENCOUNTER — Telehealth: Payer: Self-pay | Admitting: *Deleted

## 2016-02-17 MED ORDER — METFORMIN HCL 500 MG PO TABS: 500.0000 mg | ORAL_TABLET | Freq: Every day | ORAL | Status: DC

## 2016-02-17 MED ORDER — LOSARTAN POTASSIUM 50 MG PO TABS: 50.0000 mg | ORAL_TABLET | Freq: Every day | ORAL | Status: DC

## 2016-02-17 NOTE — Telephone Encounter (Signed)
Refill req for Losartan 50 mg 90 day supply and Metformin 500 mg 90 day supply. Done. See meds.

## 2016-04-10 ENCOUNTER — Encounter: Payer: Self-pay | Admitting: Internal Medicine

## 2016-04-10 ENCOUNTER — Ambulatory Visit (INDEPENDENT_AMBULATORY_CARE_PROVIDER_SITE_OTHER): Payer: Medicare Other | Admitting: Internal Medicine

## 2016-04-10 ENCOUNTER — Other Ambulatory Visit (INDEPENDENT_AMBULATORY_CARE_PROVIDER_SITE_OTHER): Payer: Medicare Other

## 2016-04-10 VITALS — BP 140/70 | HR 49 | Wt 161.0 lb

## 2016-04-10 DIAGNOSIS — H6123 Impacted cerumen, bilateral: Secondary | ICD-10-CM | POA: Diagnosis not present

## 2016-04-10 DIAGNOSIS — I10 Essential (primary) hypertension: Secondary | ICD-10-CM

## 2016-04-10 DIAGNOSIS — E1121 Type 2 diabetes mellitus with diabetic nephropathy: Secondary | ICD-10-CM

## 2016-04-10 DIAGNOSIS — F4321 Adjustment disorder with depressed mood: Secondary | ICD-10-CM | POA: Diagnosis not present

## 2016-04-10 DIAGNOSIS — M109 Gout, unspecified: Secondary | ICD-10-CM

## 2016-04-10 LAB — BASIC METABOLIC PANEL
BUN: 25 mg/dL — ABNORMAL HIGH (ref 6–23)
CO2: 30 mEq/L (ref 19–32)
Calcium: 9.7 mg/dL (ref 8.4–10.5)
Chloride: 107 mEq/L (ref 96–112)
Creatinine, Ser: 0.96 mg/dL (ref 0.40–1.20)
GFR: 71.45 mL/min (ref >60.00)
Glucose, Bld: 108 mg/dL — ABNORMAL HIGH (ref 70–99)
Potassium: 4 mEq/L (ref 3.5–5.1)
Sodium: 144 mEq/L (ref 135–145)

## 2016-04-10 MED ORDER — BENZONATATE 200 MG PO CAPS: 200.0000 mg | ORAL_CAPSULE | Freq: Two times a day (BID) | ORAL | Status: DC | PRN

## 2016-04-10 MED ORDER — DICLOFENAC SODIUM 1.5 % TD SOLN: 3.0000 [drp] | Freq: Three times a day (TID) | TRANSDERMAL | Status: DC | PRN

## 2016-04-10 NOTE — Assessment & Plan Note (Signed)
Discussed.

## 2016-04-10 NOTE — Assessment & Plan Note (Signed)
No relapse 

## 2016-04-10 NOTE — Progress Notes (Signed)
Pre visit review using our clinic review tool, if applicable. No additional management support is needed unless otherwise documented below in the visit note. 

## 2016-04-10 NOTE — Assessment & Plan Note (Signed)
Chronic  Metformin 

## 2016-04-10 NOTE — Progress Notes (Signed)
Subjective:  Patient ID: Breanna [name], female    DOB: 11/20/1931  Age: 82 y.o. MRN: [id number]  CC: No chief complaint on file.   HPI Breanna [name] presents for wax in B ears. F/u HTN, DM, OA  Outpatient Prescriptions Prior to Visit  Medication Sig Dispense Refill  . allopurinol (ZYLOPRIM) 300 MG tablet Take 1 tablet (300 mg total) by mouth daily. 90 tablet 3  . amLODipine (NORVASC) 5 MG tablet Take 1 tablet (5 mg total) by mouth daily. 90 tablet 3  . aspirin 81 MG EC tablet Take 81 mg by mouth daily.      . benzonatate (TESSALON) 200 MG capsule Take 1 capsule (200 mg total) by mouth 2 (two) times daily as needed for cough. 60 capsule 0  . brimonidine (ALPHAGAN) 0.15 % ophthalmic solution 1 drop.    Marland Kitchen CALCIUM PO Take by mouth daily.    . carvedilol (COREG) 25 MG tablet Take 1 tablet (25 mg total) by mouth 2 (two) times daily with a meal. 180 tablet 3  . Cholecalciferol (VITAMIN D3) 2000 UNITS capsule Take 2,000 Units by mouth daily.      . clonazePAM (KLONOPIN) 0.5 MG tablet Take 1 tablet (0.5 mg total) by mouth 2 (two) times daily as needed for anxiety. 60 tablet 2  . cyanocobalamin 2000 MCG tablet Take 2,000 mcg by mouth daily.    . diphenhydramine-acetaminophen (TYLENOL PM) 25-500 MG TABS 1 tablet at bedtime as needed.    . dorzolamide-timolol (COSOPT) 22.3-6.8 MG/ML ophthalmic solution 1 drop.    Marland Kitchen gabapentin (NEURONTIN) 100 MG capsule Take 1 capsule (100 mg total) by mouth 3 (three) times daily. 270 capsule 3  . latanoprost (XALATAN) 0.005 % ophthalmic solution 1 drop.    Marland Kitchen loratadine (CLARITIN) 10 MG tablet Take 1 tablet (10 mg total) by mouth daily. 100 tablet 3  . losartan (COZAAR) 50 MG tablet Take 1 tablet (50 mg total) by mouth daily. 90 tablet 3  . metFORMIN (GLUCOPHAGE) 500 MG tablet Take 1 tablet (500 mg total) by mouth daily with breakfast. 90 tablet 3  . ondansetron (ZOFRAN ODT) 4 MG disintegrating tablet Take 1 tablet (4 mg total) by mouth every 8 (eight) hours as needed  for nausea or vomiting. 10 tablet 0  . pantoprazole (PROTONIX) 40 MG tablet Take 1 tablet (40 mg total) by mouth daily. 90 tablet 3  . sertraline (ZOLOFT) 100 MG tablet Take 1 tablet (100 mg total) by mouth daily. 90 tablet 3  . timolol (TIMOPTIC) 0.25 % ophthalmic solution     . torsemide (DEMADEX) 20 MG tablet TAKE 1/2 TABLET (10MG ) BY MOUTH DAILY AS NEEDED FOR EDEMA (SWELLING) 90 tablet 3   No facility-administered medications prior to visit.    ROS Review of Systems  Constitutional: Positive for fatigue. Negative for chills, activity change, appetite change and unexpected weight change.  HENT: Negative for congestion, mouth sores and sinus pressure.   Eyes: Negative for visual disturbance.  Respiratory: Negative for cough and chest tightness.   Gastrointestinal: Negative for nausea and abdominal pain.  Genitourinary: Negative for frequency, difficulty urinating and vaginal pain.  Musculoskeletal: Positive for back pain, arthralgias and gait problem.  Skin: Negative for pallor and rash.  Neurological: Negative for dizziness, tremors, weakness, numbness and headaches.  Psychiatric/Behavioral: Negative for confusion and sleep disturbance. The patient is not nervous/anxious.     Objective:  BP 140/70 mmHg  Pulse 49  Wt 161 lb (73.029 kg)  SpO2 97%  BP Readings from Last 3 Encounters:  04/10/16 140/70  01/10/16 170/86  10/13/15 158/78    Wt Readings from Last 3 Encounters:  04/10/16 161 lb (73.029 kg)  01/10/16 158 lb (71.668 kg)  10/13/15 155 lb (70.308 kg)    Physical Exam  Constitutional: She appears well-developed. No distress.  HENT:  Head: Normocephalic.  Right Ear: External ear normal.  Left Ear: External ear normal.  Nose: Nose normal.  Mouth/Throat: Oropharynx is clear and moist.  Eyes: Conjunctivae are normal. Pupils are equal, round, and reactive to light. Right eye exhibits no discharge. Left eye exhibits no discharge.  Neck: Normal range of motion. Neck  supple. No JVD present. No tracheal deviation present. No thyromegaly present.  Cardiovascular: Normal rate, regular rhythm and normal heart sounds.   Pulmonary/Chest: No stridor. No respiratory distress. She has no wheezes.  Abdominal: Soft. Bowel sounds are normal. She exhibits no distension and no mass. There is no tenderness. There is no rebound and no guarding.  Musculoskeletal: She exhibits tenderness. She exhibits no edema.  Lymphadenopathy:    She has no cervical adenopathy.  Neurological: She displays normal reflexes. No cranial nerve deficit. She exhibits normal muscle tone. Coordination abnormal.  Skin: No rash noted. No erythema.  Psychiatric: She has a normal mood and affect. Her behavior is normal. Judgment and thought content normal.  B ear wax  Lab Results  Component Value Date   WBC 5.6 01/10/2016   HGB 11.4* 01/10/2016   HCT 35.8* 01/10/2016   PLT 164.0 01/10/2016   GLUCOSE 103* 01/10/2016   CHOL 154 10/19/2014   TRIG 56.0 10/19/2014   HDL 61.10 10/19/2014   LDLCALC 82 10/19/2014   ALT 9 10/13/2015   AST 14 10/13/2015   NA 142 01/10/2016   K 4.2 01/10/2016   CL 107 01/10/2016   CREATININE 0.87 01/10/2016   BUN 21 01/10/2016   CO2 27 01/10/2016   TSH 0.54 10/13/2015   HGBA1C 6.3 01/10/2016    Procedure Note :     Procedure :  Ear irrigation   Indication:  Cerumen impaction B   Risks, including pain, dizziness, eardrum perforation, bleeding, infection and others as well as benefits were explained to the patient in detail. Verbal consent was obtained and the patient agreed to proceed.    We used "The Elephant Ear Irrigation Device" filled with lukewarm water for irrigation. A large amount wax was recovered. Procedure has also required manual wax removal with an ear loop.   Tolerated well. Complications: None.   Postprocedure instructions :  Call if problems.   Mm Diag Breast Tomo Uni Left  11/24/2015  CLINICAL DATA:  Patient returns today to evaluate a  possible left breast asymmetry identified on recent screening mammogram. EXAM: DIGITAL DIAGNOSTIC LEFT MAMMOGRAM WITH 3D TOMOSYNTHESIS AND CAD COMPARISON:  Previous exams including recent screening mammogram dated 11/03/2015. ACR Breast Density Category b: There are scattered areas of fibroglandular density. FINDINGS: On today's additional views with spot compression and tomosynthesis, there is no persistent asymmetry within the left breast indicating superimposition of normal dense fibroglandular tissues. No dominant masses, suspicious calcifications or secondary signs of malignancy are identified within the left breast. Mammographic images were processed with CAD. IMPRESSION: No mammographic evidence of malignancy within the left breast. Patient may return to routine annual bilateral screening mammogram schedule. RECOMMENDATION: Screening mammogram in one year.(Code:SM-B-01Y) I have discussed the findings and recommendations with the patient. Results were also provided in writing at the conclusion of the visit. If  applicable, a reminder letter will be sent to the patient regarding the next appointment. BI-RADS CATEGORY  1: Negative. Electronically Signed   By: Franki Cabot M.D.   On: 11/24/2015 09:27    Assessment & Plan:   There are no diagnoses linked to this encounter. I am having Ms. Eagon maintain her aspirin, Vitamin D3, cyanocobalamin, ondansetron, brimonidine, diphenhydramine-acetaminophen, dorzolamide-timolol, latanoprost, timolol, CALCIUM PO, allopurinol, amLODipine, gabapentin, sertraline, torsemide, pantoprazole, carvedilol, benzonatate, clonazePAM, loratadine, losartan, and metFORMIN.  No orders of the defined types were placed in this encounter.     Follow-up: No Follow-up on file.  Walker Kehr, MD

## 2016-04-10 NOTE — Assessment & Plan Note (Signed)
B ears irrigated

## 2016-04-10 NOTE — Assessment & Plan Note (Signed)
Coreg, Amlodipine 

## 2016-06-04 ENCOUNTER — Ambulatory Visit (INDEPENDENT_AMBULATORY_CARE_PROVIDER_SITE_OTHER)
Admission: RE | Admit: 2016-06-04 | Discharge: 2016-06-04 | Disposition: A | Discharge status: Home / Self Care | Payer: Medicare Other | Source: Ambulatory Visit | Attending: Internal Medicine | Admitting: Internal Medicine

## 2016-06-04 ENCOUNTER — Encounter: Payer: Self-pay | Admitting: Internal Medicine

## 2016-06-04 ENCOUNTER — Ambulatory Visit (INDEPENDENT_AMBULATORY_CARE_PROVIDER_SITE_OTHER): Payer: Medicare Other | Admitting: Internal Medicine

## 2016-06-04 VITALS — BP 160/70 | HR 48 | Wt 157.0 lb

## 2016-06-04 DIAGNOSIS — I10 Essential (primary) hypertension: Secondary | ICD-10-CM

## 2016-06-04 DIAGNOSIS — E1121 Type 2 diabetes mellitus with diabetic nephropathy: Secondary | ICD-10-CM

## 2016-06-04 DIAGNOSIS — M5440 Lumbago with sciatica, unspecified side: Secondary | ICD-10-CM

## 2016-06-04 DIAGNOSIS — G8929 Other chronic pain: Secondary | ICD-10-CM | POA: Diagnosis not present

## 2016-06-04 MED ORDER — CYCLOBENZAPRINE HCL 5 MG PO TABS: 5.0000 mg | ORAL_TABLET | Freq: Two times a day (BID) | ORAL | Status: DC | PRN

## 2016-06-04 NOTE — Progress Notes (Signed)
Pre visit review using our clinic review tool, if applicable. No additional management support is needed unless otherwise documented below in the visit note. 

## 2016-06-04 NOTE — Assessment & Plan Note (Signed)
Metformin 

## 2016-06-04 NOTE — Patient Instructions (Signed)
Use props at night Pillow between the knees

## 2016-06-04 NOTE — Assessment & Plan Note (Addendum)
Norco prn d/c'd Norco had side effects - hallucinations Tylenol or Tylenol pm prn X ray

## 2016-06-04 NOTE — Progress Notes (Signed)
Subjective:  Patient ID: Breanna [name], female    DOB: [birth year]  Age: 82 y.o. MRN: [id number]  CC: Back Pain   HPI Breanna [name] presents for R LBP - chronic F/u HTN, gout f/u  Outpatient Prescriptions Prior to Visit  Medication Sig Dispense Refill  . allopurinol (ZYLOPRIM) 300 MG tablet Take 1 tablet (300 mg total) by mouth daily. 90 tablet 3  . amLODipine (NORVASC) 5 MG tablet Take 1 tablet (5 mg total) by mouth daily. 90 tablet 3  . aspirin 81 MG EC tablet Take 81 mg by mouth daily.      . brimonidine (ALPHAGAN) 0.15 % ophthalmic solution 1 drop.    Marland Kitchen CALCIUM PO Take by mouth daily.    . carvedilol (COREG) 25 MG tablet Take 1 tablet (25 mg total) by mouth 2 (two) times daily with a meal. 180 tablet 3  . Cholecalciferol (VITAMIN D3) 2000 UNITS capsule Take 2,000 Units by mouth daily.      . clonazePAM (KLONOPIN) 0.5 MG tablet Take 1 tablet (0.5 mg total) by mouth 2 (two) times daily as needed for anxiety. 60 tablet 2  . cyanocobalamin 2000 MCG tablet Take 2,000 mcg by mouth daily.    . diphenhydramine-acetaminophen (TYLENOL PM) 25-500 MG TABS 1 tablet at bedtime as needed.    . dorzolamide-timolol (COSOPT) 22.3-6.8 MG/ML ophthalmic solution 1 drop.    Marland Kitchen gabapentin (NEURONTIN) 100 MG capsule Take 1 capsule (100 mg total) by mouth 3 (three) times daily. 270 capsule 3  . loratadine (CLARITIN) 10 MG tablet Take 1 tablet (10 mg total) by mouth daily. 100 tablet 3  . losartan (COZAAR) 50 MG tablet Take 1 tablet (50 mg total) by mouth daily. 90 tablet 3  . metFORMIN (GLUCOPHAGE) 500 MG tablet Take 1 tablet (500 mg total) by mouth daily with breakfast. 90 tablet 3  . ondansetron (ZOFRAN ODT) 4 MG disintegrating tablet Take 1 tablet (4 mg total) by mouth every 8 (eight) hours as needed for nausea or vomiting. 10 tablet 0  . pantoprazole (PROTONIX) 40 MG tablet Take 1 tablet (40 mg total) by mouth daily. 90 tablet 3  . sertraline (ZOLOFT) 100 MG tablet Take 1 tablet (100 mg total) by mouth daily.  90 tablet 3  . timolol (TIMOPTIC) 0.25 % ophthalmic solution     . torsemide (DEMADEX) 20 MG tablet TAKE 1/2 TABLET (10MG ) BY MOUTH DAILY AS NEEDED FOR EDEMA (SWELLING) 90 tablet 3  . benzonatate (TESSALON) 200 MG capsule Take 1 capsule (200 mg total) by mouth 2 (two) times daily as needed for cough. 60 capsule 2  . Diclofenac Sodium (PENNSAID) 1.5 % SOLN Place 0.2-0.3 mLs onto the skin 3 (three) times daily as needed. 150 mL 5  . latanoprost (XALATAN) 0.005 % ophthalmic solution 1 drop.     No facility-administered medications prior to visit.    ROS Review of Systems  Constitutional: Positive for fatigue. Negative for chills, activity change, appetite change and unexpected weight change.  HENT: Negative for congestion, mouth sores and sinus pressure.   Eyes: Negative for visual disturbance.  Respiratory: Negative for cough and chest tightness.   Gastrointestinal: Negative for nausea and abdominal pain.  Genitourinary: Negative for frequency, difficulty urinating and vaginal pain.  Musculoskeletal: Positive for back pain and gait problem.  Skin: Negative for pallor and rash.  Neurological: Negative for dizziness, tremors, weakness, numbness and headaches.  Psychiatric/Behavioral: Negative for confusion and sleep disturbance.    Objective:  BP 160/70 mmHg  Pulse 48  Wt 157 lb (71.215 kg)  SpO2 97%  BP Readings from Last 3 Encounters:  06/04/16 160/70  04/10/16 140/70  01/10/16 170/86    Wt Readings from Last 3 Encounters:  06/04/16 157 lb (71.215 kg)  04/10/16 161 lb (73.029 kg)  01/10/16 158 lb (71.668 kg)    Physical Exam  Constitutional: She appears well-developed. No distress.  HENT:  Head: Normocephalic.  Right Ear: External ear normal.  Left Ear: External ear normal.  Nose: Nose normal.  Mouth/Throat: Oropharynx is clear and moist.  Eyes: Conjunctivae are normal. Pupils are equal, round, and reactive to light. Right eye exhibits no discharge. Left eye exhibits  no discharge.  Neck: Normal range of motion. Neck supple. No JVD present. No tracheal deviation present. No thyromegaly present.  Cardiovascular: Normal rate, regular rhythm and normal heart sounds.   Pulmonary/Chest: No stridor. No respiratory distress. She has no wheezes.  Abdominal: Soft. Bowel sounds are normal. She exhibits no distension and no mass. There is no tenderness. There is no rebound and no guarding.  Musculoskeletal: She exhibits tenderness. She exhibits no edema.  Lymphadenopathy:    She has no cervical adenopathy.  Neurological: She displays normal reflexes. No cranial nerve deficit. She exhibits normal muscle tone. Coordination abnormal.  Skin: No rash noted. No erythema.  Psychiatric: She has a normal mood and affect. Her behavior is normal. Judgment and thought content normal.   Cane LS is tender   Lab Results  Component Value Date   WBC 5.6 01/10/2016   HGB 11.4* 01/10/2016   HCT 35.8* 01/10/2016   PLT 164.0 01/10/2016   GLUCOSE 108* 04/10/2016   CHOL 154 10/19/2014   TRIG 56.0 10/19/2014   HDL 61.10 10/19/2014   LDLCALC 82 10/19/2014   ALT 9 10/13/2015   AST 14 10/13/2015   NA 144 04/10/2016   K 4.0 04/10/2016   CL 107 04/10/2016   CREATININE 0.96 04/10/2016   BUN 25* 04/10/2016   CO2 30 04/10/2016   TSH 0.54 10/13/2015   HGBA1C 6.3 01/10/2016    Mm Diag Breast Tomo Uni Left  11/24/2015  CLINICAL DATA:  Patient returns today to evaluate a possible left breast asymmetry identified on recent screening mammogram. EXAM: DIGITAL DIAGNOSTIC LEFT MAMMOGRAM WITH 3D TOMOSYNTHESIS AND CAD COMPARISON:  Previous exams including recent screening mammogram dated 11/03/2015. ACR Breast Density Category b: There are scattered areas of fibroglandular density. FINDINGS: On today's additional views with spot compression and tomosynthesis, there is no persistent asymmetry within the left breast indicating superimposition of normal dense fibroglandular tissues. No dominant  masses, suspicious calcifications or secondary signs of malignancy are identified within the left breast. Mammographic images were processed with CAD. IMPRESSION: No mammographic evidence of malignancy within the left breast. Patient may return to routine annual bilateral screening mammogram schedule. RECOMMENDATION: Screening mammogram in one year.(Code:SM-B-01Y) I have discussed the findings and recommendations with the patient. Results were also provided in writing at the conclusion of the visit. If applicable, a reminder letter will be sent to the patient regarding the next appointment. BI-RADS CATEGORY  1: Negative. Electronically Signed   By: Franki Cabot M.D.   On: 11/24/2015 09:27    Assessment & Plan:   There are no diagnoses linked to this encounter. I have discontinued Ms. Chaikin's Diclofenac Sodium and benzonatate. I am also having her maintain her aspirin, Vitamin D3, cyanocobalamin, ondansetron, brimonidine, diphenhydramine-acetaminophen, dorzolamide-timolol, timolol, CALCIUM PO, allopurinol, amLODipine, gabapentin, sertraline, torsemide, pantoprazole, carvedilol, clonazePAM, loratadine, losartan, metFORMIN,  and latanoprost.  Meds ordered this encounter  Medications  . latanoprost (XALATAN) 0.005 % ophthalmic solution    Sig: Place 1 drop into both eyes 2 (two) times daily.     Follow-up: No Follow-up on file.  Walker Kehr, MD

## 2016-06-04 NOTE — Assessment & Plan Note (Signed)
Coreg, Amlodipine 

## 2016-06-05 ENCOUNTER — Telehealth: Payer: Self-pay | Admitting: *Deleted

## 2016-06-05 NOTE — Telephone Encounter (Signed)
Receive call pt states the rx MD gave yesterday (Flexerill) is too expensive insurance will not cover cost $53.99. Pt is requesting cheaper alternative...Breanna [name]

## 2016-06-06 MED ORDER — TIZANIDINE HCL 4 MG PO TABS: 2.0000 mg | ORAL_TABLET | Freq: Three times a day (TID) | ORAL | Status: DC | PRN

## 2016-06-06 NOTE — Telephone Encounter (Signed)
Pls advise on msg below.../lmb 

## 2016-06-06 NOTE — Telephone Encounter (Signed)
Try Zanaflex Thx

## 2016-06-07 NOTE — Telephone Encounter (Signed)
Notified pt MD sent new relaxant to pharmacy...Breanna [name]

## 2016-07-11 ENCOUNTER — Encounter: Payer: Self-pay | Admitting: Internal Medicine

## 2016-07-11 ENCOUNTER — Ambulatory Visit (INDEPENDENT_AMBULATORY_CARE_PROVIDER_SITE_OTHER): Payer: Medicare Other | Admitting: Internal Medicine

## 2016-07-11 VITALS — BP 150/68 | HR 48 | Wt 158.0 lb

## 2016-07-11 DIAGNOSIS — I10 Essential (primary) hypertension: Secondary | ICD-10-CM | POA: Diagnosis not present

## 2016-07-11 DIAGNOSIS — R140 Abdominal distension (gaseous): Secondary | ICD-10-CM

## 2016-07-11 DIAGNOSIS — I6350 Cerebral infarction due to unspecified occlusion or stenosis of unspecified cerebral artery: Secondary | ICD-10-CM

## 2016-07-11 DIAGNOSIS — M5440 Lumbago with sciatica, unspecified side: Secondary | ICD-10-CM | POA: Diagnosis not present

## 2016-07-11 DIAGNOSIS — G8929 Other chronic pain: Secondary | ICD-10-CM

## 2016-07-11 MED ORDER — TORSEMIDE 20 MG PO TABS: ORAL_TABLET | ORAL | Status: DC

## 2016-07-11 MED ORDER — SIMETHICONE 80 MG PO CHEW: 80.0000 mg | CHEWABLE_TABLET | Freq: Four times a day (QID) | ORAL | Status: DC | PRN

## 2016-07-11 NOTE — Assessment & Plan Note (Signed)
Hold metformin x 1 wk Diet discussed Simethicone prn

## 2016-07-11 NOTE — Patient Instructions (Signed)
Hold Metformin for a few days to see if gas problem is better

## 2016-07-11 NOTE — Assessment & Plan Note (Signed)
Tylenol prn 

## 2016-07-11 NOTE — Assessment & Plan Note (Signed)
ASA BP meds 

## 2016-07-11 NOTE — Progress Notes (Signed)
Subjective:  Patient ID: Breanna [name], female    DOB: [birth year]  Age: 82 y.o. MRN: [id number]  CC: No chief complaint on file.   HPI Breanna [name] presents for LBP - better; HTN, GERD. C/o gas  Outpatient Prescriptions Prior to Visit  Medication Sig Dispense Refill  . allopurinol (ZYLOPRIM) 300 MG tablet Take 1 tablet (300 mg total) by mouth daily. 90 tablet 3  . amLODipine (NORVASC) 5 MG tablet Take 1 tablet (5 mg total) by mouth daily. 90 tablet 3  . aspirin 81 MG EC tablet Take 81 mg by mouth daily.      . brimonidine (ALPHAGAN) 0.15 % ophthalmic solution 1 drop.    Marland Kitchen CALCIUM PO Take by mouth daily.    . carvedilol (COREG) 25 MG tablet Take 1 tablet (25 mg total) by mouth 2 (two) times daily with a meal. 180 tablet 3  . Cholecalciferol (VITAMIN D3) 2000 UNITS capsule Take 2,000 Units by mouth daily.      . clonazePAM (KLONOPIN) 0.5 MG tablet Take 1 tablet (0.5 mg total) by mouth 2 (two) times daily as needed for anxiety. 60 tablet 2  . cyanocobalamin 2000 MCG tablet Take 2,000 mcg by mouth daily.    . diphenhydramine-acetaminophen (TYLENOL PM) 25-500 MG TABS 1 tablet at bedtime as needed.    . dorzolamide-timolol (COSOPT) 22.3-6.8 MG/ML ophthalmic solution 1 drop.    Marland Kitchen gabapentin (NEURONTIN) 100 MG capsule Take 1 capsule (100 mg total) by mouth 3 (three) times daily. 270 capsule 3  . latanoprost (XALATAN) 0.005 % ophthalmic solution Place 1 drop into both eyes 2 (two) times daily.    Marland Kitchen loratadine (CLARITIN) 10 MG tablet Take 1 tablet (10 mg total) by mouth daily. 100 tablet 3  . losartan (COZAAR) 50 MG tablet Take 1 tablet (50 mg total) by mouth daily. 90 tablet 3  . metFORMIN (GLUCOPHAGE) 500 MG tablet Take 1 tablet (500 mg total) by mouth daily with breakfast. 90 tablet 3  . pantoprazole (PROTONIX) 40 MG tablet Take 1 tablet (40 mg total) by mouth daily. 90 tablet 3  . sertraline (ZOLOFT) 100 MG tablet Take 1 tablet (100 mg total) by mouth daily. 90 tablet 3  . timolol (TIMOPTIC)  0.25 % ophthalmic solution     . tiZANidine (ZANAFLEX) 4 MG tablet Take 0.5-1 tablets (2-4 mg total) by mouth every 8 (eight) hours as needed for muscle spasms. 60 tablet 3  . torsemide (DEMADEX) 20 MG tablet TAKE 1/2 TABLET (10MG ) BY MOUTH DAILY AS NEEDED FOR EDEMA (SWELLING) 90 tablet 3  . ondansetron (ZOFRAN ODT) 4 MG disintegrating tablet Take 1 tablet (4 mg total) by mouth every 8 (eight) hours as needed for nausea or vomiting. (Patient not taking: Reported on 07/11/2016) 10 tablet 0   No facility-administered medications prior to visit.    ROS Review of Systems  Constitutional: Negative for chills, activity change, appetite change, fatigue and unexpected weight change.  HENT: Negative for congestion, mouth sores and sinus pressure.   Eyes: Negative for visual disturbance.  Respiratory: Negative for cough and chest tightness.   Cardiovascular: Negative for chest pain.  Gastrointestinal: Negative for nausea and abdominal pain.  Genitourinary: Negative for frequency, difficulty urinating and vaginal pain.  Musculoskeletal: Positive for back pain, arthralgias and gait problem.  Skin: Negative for pallor and rash.  Neurological: Negative for dizziness, tremors, weakness, numbness and headaches.  Psychiatric/Behavioral: Negative for suicidal ideas, confusion and sleep disturbance. The patient is not nervous/anxious.  Objective:  BP 150/68 mmHg  Pulse 48  Wt 158 lb (71.668 kg)  SpO2 98%  BP Readings from Last 3 Encounters:  07/11/16 150/68  06/04/16 160/70  04/10/16 140/70    Wt Readings from Last 3 Encounters:  07/11/16 158 lb (71.668 kg)  06/04/16 157 lb (71.215 kg)  04/10/16 161 lb (73.029 kg)    Physical Exam  Constitutional: She appears well-developed. No distress.  HENT:  Head: Normocephalic.  Right Ear: External ear normal.  Left Ear: External ear normal.  Nose: Nose normal.  Mouth/Throat: Oropharynx is clear and moist.  Eyes: Conjunctivae are normal. Pupils  are equal, round, and reactive to light. Right eye exhibits no discharge. Left eye exhibits no discharge.  Neck: Normal range of motion. Neck supple. No JVD present. No tracheal deviation present. No thyromegaly present.  Cardiovascular: Normal rate, regular rhythm and normal heart sounds.   Pulmonary/Chest: No stridor. No respiratory distress. She has no wheezes.  Abdominal: Soft. Bowel sounds are normal. She exhibits no distension and no mass. There is no tenderness. There is no rebound and no guarding.  Musculoskeletal: She exhibits tenderness. She exhibits no edema.  Lymphadenopathy:    She has no cervical adenopathy.  Neurological: She displays normal reflexes. No cranial nerve deficit. She exhibits normal muscle tone. Coordination abnormal.  Skin: No rash noted. No erythema.  Psychiatric: She has a normal mood and affect. Her behavior is normal. Judgment and thought content normal.  Cane  Lab Results  Component Value Date   WBC 5.6 01/10/2016   HGB 11.4* 01/10/2016   HCT 35.8* 01/10/2016   PLT 164.0 01/10/2016   GLUCOSE 108* 04/10/2016   CHOL 154 10/19/2014   TRIG 56.0 10/19/2014   HDL 61.10 10/19/2014   LDLCALC 82 10/19/2014   ALT 9 10/13/2015   AST 14 10/13/2015   NA 144 04/10/2016   K 4.0 04/10/2016   CL 107 04/10/2016   CREATININE 0.96 04/10/2016   BUN 25* 04/10/2016   CO2 30 04/10/2016   TSH 0.54 10/13/2015   HGBA1C 6.3 01/10/2016    Dg Lumbar Spine 2-3 Views  06/04/2016  CLINICAL DATA:  82 year old female with chronic intermittent lumbar back pain on the right. Progressive symptoms for 2 weeks with no known injury. Previous surgery. EXAM: LUMBAR SPINE - 2-3 VIEW COMPARISON:  CT Abdomen and Pelvis 10/18/2010. FINDINGS: Normal lumbar segmentation. Osteopenia. Previous L3-L4 and L4-L5 decompression and fusion. Hardware appears intact. Chronic disc space loss and grade 1 anterolisthesis at L5-S1 appears stable. Grade 1 anterolisthesis with vacuum disc at L2-L3 appears  increased since 2011. Associated increased anterior superior L3 endplate sclerosis. Spondylolisthesis there are estimated at 4 mm. Questionable L2 pars fracture (image 2). Chronic facet arthropathy at L1-L2, with vacuum facet there previously. Chronic lower thoracic disc space loss and endplate spurring also appears progressed. Grossly intact sacrum. IMPRESSION: 1. Chronic L3-L4 and L4-L5 decompression and fusion. 2. 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 Electronically Signed   By: Genevie Ann M.D.   On: 06/04/2016 10:33    Assessment & Plan:   There are no diagnoses linked to this encounter. I am having Ms. Hank maintain her aspirin, Vitamin D3, cyanocobalamin, ondansetron, brimonidine, diphenhydramine-acetaminophen, dorzolamide-timolol, timolol, CALCIUM PO, allopurinol, amLODipine, gabapentin, sertraline, torsemide, pantoprazole, carvedilol, clonazePAM, loratadine, losartan, metFORMIN, latanoprost, tiZANidine, and benzonatate.  Meds ordered this encounter  Medications  . benzonatate (TESSALON) 200 MG capsule    Sig: as  needed.     Follow-up: No Follow-up on file.  Walker Kehr, MD

## 2016-07-11 NOTE — Progress Notes (Signed)
Pre visit review using our clinic review tool, if applicable. No additional management support is needed unless otherwise documented below in the visit note. 

## 2016-07-11 NOTE — Assessment & Plan Note (Signed)
Coreg, Norvasc 

## 2016-07-12 ENCOUNTER — Ambulatory Visit: Payer: Medicare Other | Admitting: Internal Medicine

## 2016-10-02 ENCOUNTER — Other Ambulatory Visit: Payer: Self-pay | Admitting: Internal Medicine

## 2016-10-22 ENCOUNTER — Other Ambulatory Visit: Payer: Self-pay | Admitting: Internal Medicine

## 2016-10-22 DIAGNOSIS — Z1231 Encounter for screening mammogram for malignant neoplasm of breast: Secondary | ICD-10-CM

## 2016-11-12 ENCOUNTER — Encounter: Payer: Self-pay | Admitting: Internal Medicine

## 2016-11-12 ENCOUNTER — Ambulatory Visit (INDEPENDENT_AMBULATORY_CARE_PROVIDER_SITE_OTHER): Payer: Medicare Other | Admitting: Internal Medicine

## 2016-11-12 ENCOUNTER — Other Ambulatory Visit (INDEPENDENT_AMBULATORY_CARE_PROVIDER_SITE_OTHER): Payer: Medicare Other

## 2016-11-12 DIAGNOSIS — I10 Essential (primary) hypertension: Secondary | ICD-10-CM

## 2016-11-12 DIAGNOSIS — E1129 Type 2 diabetes mellitus with other diabetic kidney complication: Secondary | ICD-10-CM | POA: Diagnosis not present

## 2016-11-12 DIAGNOSIS — M6530 Trigger finger, unspecified finger: Secondary | ICD-10-CM | POA: Insufficient documentation

## 2016-11-12 DIAGNOSIS — M1000 Idiopathic gout, unspecified site: Secondary | ICD-10-CM | POA: Diagnosis not present

## 2016-11-12 DIAGNOSIS — E1121 Type 2 diabetes mellitus with diabetic nephropathy: Secondary | ICD-10-CM | POA: Diagnosis not present

## 2016-11-12 LAB — BASIC METABOLIC PANEL
BUN: 29 mg/dL — ABNORMAL HIGH (ref 6–23)
CO2: 27 mEq/L (ref 19–32)
Calcium: 9.8 mg/dL (ref 8.4–10.5)
Chloride: 105 mEq/L (ref 96–112)
Creatinine, Ser: 1.11 mg/dL (ref 0.40–1.20)
GFR: 60.34 mL/min (ref >60.00)
Glucose, Bld: 108 mg/dL — ABNORMAL HIGH (ref 70–99)
Potassium: 4.1 mEq/L (ref 3.5–5.1)
Sodium: 140 mEq/L (ref 135–145)

## 2016-11-12 LAB — HEMOGLOBIN A1C: Hgb A1c MFr Bld: 6.2 % (ref 4.6–6.5)

## 2016-11-12 NOTE — Assessment & Plan Note (Signed)
Coreg Amlodipine

## 2016-11-12 NOTE — Assessment & Plan Note (Signed)
Metformin 

## 2016-11-12 NOTE — Assessment & Plan Note (Signed)
Declined inj  Aleve qd x 1 wk pc --- risks discussed

## 2016-11-12 NOTE — Progress Notes (Signed)
Subjective:  Patient ID: Breanna [name], female    DOB: January 20, 1931  Age: 82 y.o. MRN: [id number]  CC: No chief complaint on file.   HPI RAGAD ROTHROCK presents for DM, HTN, gout C/o R 4th finger pain and triggering x 1 mo  Outpatient Medications Prior to Visit  Medication Sig Dispense Refill  . allopurinol (ZYLOPRIM) 300 MG tablet TAKE 1 TABLET BY MOUTH EVERY DAY 90 tablet 3  . amLODipine (NORVASC) 5 MG tablet Take 1 tablet (5 mg total) by mouth daily. 90 tablet 3  . aspirin 81 MG EC tablet Take 81 mg by mouth daily.      . benzonatate (TESSALON) 200 MG capsule as needed.    . brimonidine (ALPHAGAN) 0.15 % ophthalmic solution 1 drop.    Marland Kitchen CALCIUM PO Take by mouth daily.    . carvedilol (COREG) 25 MG tablet Take 1 tablet (25 mg total) by mouth 2 (two) times daily with a meal. 180 tablet 3  . Cholecalciferol (VITAMIN D3) 2000 UNITS capsule Take 2,000 Units by mouth daily.      . clonazePAM (KLONOPIN) 0.5 MG tablet Take 1 tablet (0.5 mg total) by mouth 2 (two) times daily as needed for anxiety. 60 tablet 2  . cyanocobalamin 2000 MCG tablet Take 2,000 mcg by mouth daily.    . diphenhydramine-acetaminophen (TYLENOL PM) 25-500 MG TABS 1 tablet at bedtime as needed.    . dorzolamide-timolol (COSOPT) 22.3-6.8 MG/ML ophthalmic solution 1 drop.    Marland Kitchen gabapentin (NEURONTIN) 100 MG capsule Take 1 capsule (100 mg total) by mouth 3 (three) times daily. 270 capsule 3  . latanoprost (XALATAN) 0.005 % ophthalmic solution Place 1 drop into both eyes 2 (two) times daily.    Marland Kitchen loratadine (CLARITIN) 10 MG tablet Take 1 tablet (10 mg total) by mouth daily. 100 tablet 3  . losartan (COZAAR) 50 MG tablet Take 1 tablet (50 mg total) by mouth daily. 90 tablet 3  . metFORMIN (GLUCOPHAGE) 500 MG tablet Take 1 tablet (500 mg total) by mouth daily with breakfast. 90 tablet 3  . ondansetron (ZOFRAN ODT) 4 MG disintegrating tablet Take 1 tablet (4 mg total) by mouth every 8 (eight) hours as needed for nausea or vomiting. 10  tablet 0  . pantoprazole (PROTONIX) 40 MG tablet Take 1 tablet (40 mg total) by mouth daily. 90 tablet 3  . sertraline (ZOLOFT) 100 MG tablet Take 1 tablet (100 mg total) by mouth daily. 90 tablet 3  . simethicone (GAS-X) 80 MG chewable tablet Chew 1 tablet (80 mg total) by mouth every 6 (six) hours as needed for flatulence. 30 tablet 0  . timolol (TIMOPTIC) 0.25 % ophthalmic solution     . tiZANidine (ZANAFLEX) 4 MG tablet Take 0.5-1 tablets (2-4 mg total) by mouth every 8 (eight) hours as needed for muscle spasms. 60 tablet 3  . torsemide (DEMADEX) 20 MG tablet TAKE 1/2 TABLET (10MG ) BY MOUTH DAILY AS NEEDED FOR EDEMA (SWELLING) 90 tablet 3   No facility-administered medications prior to visit.     ROS Review of Systems  Constitutional: Negative for activity change, appetite change, chills, fatigue and unexpected weight change.  HENT: Negative for congestion, mouth sores and sinus pressure.   Eyes: Negative for visual disturbance.  Respiratory: Negative for cough and chest tightness.   Gastrointestinal: Negative for abdominal pain and nausea.  Genitourinary: Negative for difficulty urinating, frequency and vaginal pain.  Musculoskeletal: Positive for arthralgias and back pain. Negative for gait problem.  Skin: Negative for pallor and rash.  Neurological: Negative for dizziness, tremors, weakness, numbness and headaches.  Psychiatric/Behavioral: Negative for confusion and sleep disturbance.    Objective:  BP (!) 168/82   Pulse (!) 54   Temp 98.6 F (37 C)   Wt 162 lb (73.5 kg)   SpO2 100%   BMI 29.63 kg/m   BP Readings from Last 3 Encounters:  11/12/16 (!) 168/82  07/11/16 (!) 150/68  06/04/16 (!) 160/70    Wt Readings from Last 3 Encounters:  11/12/16 162 lb (73.5 kg)  07/11/16 158 lb (71.7 kg)  06/04/16 157 lb (71.2 kg)    Physical Exam  Constitutional: She appears well-developed. No distress.  HENT:  Head: Normocephalic.  Right Ear: External ear normal.  Left  Ear: External ear normal.  Nose: Nose normal.  Mouth/Throat: Oropharynx is clear and moist.  Eyes: Conjunctivae are normal. Pupils are equal, round, and reactive to light. Right eye exhibits no discharge. Left eye exhibits no discharge.  Neck: Normal range of motion. Neck supple. No JVD present. No tracheal deviation present. No thyromegaly present.  Cardiovascular: Normal rate, regular rhythm and normal heart sounds.   Pulmonary/Chest: No stridor. No respiratory distress. She has no wheezes.  Abdominal: Soft. Bowel sounds are normal. She exhibits no distension and no mass. There is no tenderness. There is no rebound and no guarding.  Musculoskeletal: She exhibits tenderness. She exhibits no edema.  Lymphadenopathy:    She has no cervical adenopathy.  Neurological: She displays normal reflexes. No cranial nerve deficit. She exhibits normal muscle tone. Coordination abnormal.  Skin: No rash noted. No erythema.  Psychiatric: She has a normal mood and affect. Her behavior is normal. Judgment and thought content normal.  Cane R 4th finger pain and triggering LS tender  Lab Results  Component Value Date   WBC 5.6 01/10/2016   HGB 11.4 (L) 01/10/2016   HCT 35.8 (L) 01/10/2016   PLT 164.0 01/10/2016   GLUCOSE 108 (H) 04/10/2016   CHOL 154 10/19/2014   TRIG 56.0 10/19/2014   HDL 61.10 10/19/2014   LDLCALC 82 10/19/2014   ALT 9 10/13/2015   AST 14 10/13/2015   NA 144 04/10/2016   K 4.0 04/10/2016   CL 107 04/10/2016   CREATININE 0.96 04/10/2016   BUN 25 (H) 04/10/2016   CO2 30 04/10/2016   TSH 0.54 10/13/2015   HGBA1C 6.3 01/10/2016    Dg Lumbar Spine 2-3 Views  Result Date: 06/04/2016 CLINICAL DATA:  82 year old female with chronic intermittent lumbar back pain on the right. Progressive symptoms for 2 weeks with no known injury. Previous surgery. EXAM: LUMBAR SPINE - 2-3 VIEW COMPARISON:  CT Abdomen and Pelvis 10/18/2010. FINDINGS: Normal lumbar segmentation. Osteopenia. Previous  L3-L4 and L4-L5 decompression and fusion. Hardware appears intact. Chronic disc space loss and grade 1 anterolisthesis at L5-S1 appears stable. Grade 1 anterolisthesis with vacuum disc at L2-L3 appears increased since 2011. Associated increased anterior superior L3 endplate sclerosis. Spondylolisthesis there are estimated at 4 mm. Questionable L2 pars fracture (image 2). Chronic facet arthropathy at L1-L2, with vacuum facet there previously. Chronic lower thoracic disc space loss and endplate spurring also appears progressed. Grossly intact sacrum. IMPRESSION: 1. Chronic L3-L4 and L4-L5 decompression and fusion. 2. 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 Electronically Signed   By: Genevie Ann M.D.   On: 06/04/2016 10:33    Assessment & Plan:  There are no diagnoses linked to this encounter. I am having Ms. Garden maintain her aspirin, Vitamin D3, cyanocobalamin, ondansetron, brimonidine, diphenhydramine-acetaminophen, dorzolamide-timolol, timolol, CALCIUM PO, amLODipine, gabapentin, sertraline, pantoprazole, carvedilol, clonazePAM, loratadine, losartan, metFORMIN, latanoprost, tiZANidine, benzonatate, torsemide, simethicone, and allopurinol.  No orders of the defined types were placed in this encounter.    Follow-up: No Follow-up on file.  Walker Kehr, MD

## 2016-11-12 NOTE — Assessment & Plan Note (Signed)
On Allopurinol 

## 2016-11-12 NOTE — Progress Notes (Signed)
Pre visit review using our clinic review tool, if applicable. No additional management support is needed unless otherwise documented below in the visit note. 

## 2016-11-12 NOTE — Patient Instructions (Signed)
Trigger Finger  Trigger finger (digital tendinitis and stenosing tenosynovitis) is a common disorder that causes an often painful catching of the fingers or thumb. It occurs as a clicking, snapping, or locking of a finger in the palm of the hand. This is caused by a problem with the tendons that flex or bend the fingers sliding smoothly through their sheaths. The condition may occur in any finger or a couple fingers at the same time.   The finger may lock with the finger curled or suddenly straighten out with a snap. This is more common in patients with rheumatoid arthritis and diabetes. Left untreated, the condition may get worse to the point where the finger becomes locked in flexion, like making a fist, or less commonly locked with the finger straightened out.  CAUSES    Inflammation and scarring that lead to swelling around the tendon sheath.   Repeated or forceful movements.   Rheumatoid arthritis, an autoimmune disease that affects joints.   Gout.   Diabetes mellitus.  SIGNS AND SYMPTOMS   Soreness and swelling of your finger.   A painful clicking or snapping as you bend and straighten your finger.  DIAGNOSIS   Your health care provider will do a physical exam of your finger to diagnose trigger finger.  TREATMENT    Splinting for 6-8 weeks may be helpful.   Nonsteroidal anti-inflammatory medicines (NSAIDs) can help to relieve the pain and inflammation.   Cortisone injections, along with splinting, may speed up recovery. Several injections may be required. Cortisone may give relief after one injection.   Surgery is another treatment that may be used if conservative treatments do not work. Surgery can be minor, without incisions (a cut does not have to be made), and can be done with a needle through the skin.   Other surgical choices involve an open procedure in which the surgeon opens the hand through a small incision and cuts the pulley so the tendon can again slide smoothly. Your hand will still  work fine.  HOME CARE INSTRUCTIONS   Apply ice to the injured area, twice per day:    Put ice in a plastic bag.    Place a towel between your skin and the bag.    Leave the ice on for 20 minutes, 3-4 times a day.   Rest your hand often.  MAKE SURE YOU:    Understand these instructions.   Will watch your condition.   Will get help right away if you are not doing well or get worse.     This information is not intended to replace advice given to you by your health care provider. Make sure you discuss any questions you have with your health care provider.     Document Released: 09/29/2004 Document Revised: 08/12/2013 Document Reviewed: 05/12/2013  Elsevier Interactive Patient Education 2017 [employer]

## 2016-11-20 ENCOUNTER — Ambulatory Visit
Admission: RE | Admit: 2016-11-20 | Discharge: 2016-11-20 | Disposition: A | Discharge status: Home / Self Care | Payer: Medicare Other | Source: Ambulatory Visit | Attending: Internal Medicine | Admitting: Internal Medicine

## 2016-11-20 DIAGNOSIS — Z1231 Encounter for screening mammogram for malignant neoplasm of breast: Secondary | ICD-10-CM

## 2016-11-21 ENCOUNTER — Ambulatory Visit: Payer: Medicare Other

## 2016-11-21 ENCOUNTER — Ambulatory Visit (INDEPENDENT_AMBULATORY_CARE_PROVIDER_SITE_OTHER): Payer: Medicare Other

## 2016-11-21 VITALS — BP 172/78

## 2016-11-21 DIAGNOSIS — Z23 Encounter for immunization: Secondary | ICD-10-CM | POA: Insufficient documentation

## 2016-11-21 DIAGNOSIS — I10 Essential (primary) hypertension: Secondary | ICD-10-CM | POA: Diagnosis not present

## 2016-11-21 NOTE — Progress Notes (Signed)
Per pt she was advised to come in for a BP re-check 1 week after last OV with PCP due to consistent elevated reading. No documentation via Office Note - will forward to Dr Alain Marion for order

## 2016-11-30 ENCOUNTER — Other Ambulatory Visit: Payer: Self-pay | Admitting: Internal Medicine

## 2016-12-30 ENCOUNTER — Other Ambulatory Visit: Payer: Self-pay | Admitting: Internal Medicine

## 2017-01-17 ENCOUNTER — Telehealth: Payer: Self-pay | Admitting: Internal Medicine

## 2017-01-17 MED ORDER — TORSEMIDE 20 MG PO TABS: ORAL_TABLET | ORAL | 3 refills | Status: DC

## 2017-01-17 NOTE — Telephone Encounter (Signed)
Refill sent. See meds.  

## 2017-01-17 NOTE — Telephone Encounter (Signed)
Pt request new rx for torsemide (DEMADEX) 20 MG tablet send to [facility]. Please advise.

## 2017-01-17 NOTE — Telephone Encounter (Signed)
Fill x 1 year Thx

## 2017-03-01 ENCOUNTER — Other Ambulatory Visit: Payer: Self-pay | Admitting: Internal Medicine

## 2017-03-11 ENCOUNTER — Other Ambulatory Visit (INDEPENDENT_AMBULATORY_CARE_PROVIDER_SITE_OTHER): Payer: Medicare Other

## 2017-03-11 ENCOUNTER — Encounter: Payer: Self-pay | Admitting: Internal Medicine

## 2017-03-11 ENCOUNTER — Ambulatory Visit (INDEPENDENT_AMBULATORY_CARE_PROVIDER_SITE_OTHER): Payer: Medicare Other | Admitting: Internal Medicine

## 2017-03-11 DIAGNOSIS — E1121 Type 2 diabetes mellitus with diabetic nephropathy: Secondary | ICD-10-CM | POA: Diagnosis not present

## 2017-03-11 DIAGNOSIS — I10 Essential (primary) hypertension: Secondary | ICD-10-CM

## 2017-03-11 DIAGNOSIS — R634 Abnormal weight loss: Secondary | ICD-10-CM | POA: Diagnosis not present

## 2017-03-11 DIAGNOSIS — M153 Secondary multiple arthritis: Secondary | ICD-10-CM

## 2017-03-11 DIAGNOSIS — I6350 Cerebral infarction due to unspecified occlusion or stenosis of unspecified cerebral artery: Secondary | ICD-10-CM

## 2017-03-11 DIAGNOSIS — G8929 Other chronic pain: Secondary | ICD-10-CM

## 2017-03-11 DIAGNOSIS — R195 Other fecal abnormalities: Secondary | ICD-10-CM

## 2017-03-11 DIAGNOSIS — M5440 Lumbago with sciatica, unspecified side: Secondary | ICD-10-CM

## 2017-03-11 LAB — CBC WITH DIFFERENTIAL/PLATELET
Basophils Absolute: 0 10*3/uL (ref 0.0–0.1)
Basophils Relative: 0.5 % (ref 0.0–3.0)
Eosinophils Absolute: 0.2 10*3/uL (ref 0.0–0.7)
Eosinophils Relative: 4.9 % (ref 0.0–5.0)
HCT: 35.7 % — ABNORMAL LOW (ref 36.0–46.0)
Hemoglobin: 11.3 g/dL — ABNORMAL LOW (ref 12.0–15.0)
Lymphocytes Relative: 26.4 % (ref 12.0–46.0)
Lymphs Abs: 1.2 10*3/uL (ref 0.7–4.0)
MCHC: 31.7 g/dL (ref 30.0–36.0)
MCV: 90.5 fl (ref 78.0–100.0)
Monocytes Absolute: 0.5 10*3/uL (ref 0.1–1.0)
Monocytes Relative: 10.8 % (ref 3.0–12.0)
Neutro Abs: 2.7 10*3/uL (ref 1.4–7.7)
Neutrophils Relative %: 57.4 % (ref 43.0–77.0)
Platelets: 143 10*3/uL — ABNORMAL LOW (ref 150.0–400.0)
RBC: 3.95 Mil/uL (ref 3.87–5.11)
RDW: 16.8 % — ABNORMAL HIGH (ref 11.5–15.5)
WBC: 4.7 10*3/uL (ref 4.0–10.5)

## 2017-03-11 LAB — BASIC METABOLIC PANEL
BUN: 24 mg/dL — ABNORMAL HIGH (ref 6–23)
CO2: 27 mEq/L (ref 19–32)
Calcium: 9.6 mg/dL (ref 8.4–10.5)
Chloride: 105 mEq/L (ref 96–112)
Creatinine, Ser: 1.13 mg/dL (ref 0.40–1.20)
GFR: 59.06 mL/min — ABNORMAL LOW (ref >60.00)
Glucose, Bld: 103 mg/dL — ABNORMAL HIGH (ref 70–99)
Potassium: 4.1 mEq/L (ref 3.5–5.1)
Sodium: 141 mEq/L (ref 135–145)

## 2017-03-11 LAB — HEPATIC FUNCTION PANEL
ALT: 10 U/L (ref 0–35)
AST: 19 U/L (ref 0–37)
Albumin: 3.7 g/dL (ref 3.5–5.2)
Alkaline Phosphatase: 103 U/L (ref 39–117)
Bilirubin, Direct: 0.1 mg/dL (ref 0.0–0.3)
Total Bilirubin: 0.4 mg/dL (ref 0.2–1.2)
Total Protein: 7.2 g/dL (ref 6.0–8.3)

## 2017-03-11 LAB — HEMOGLOBIN A1C: Hgb A1c MFr Bld: 6.4 % (ref 4.6–6.5)

## 2017-03-11 NOTE — Assessment & Plan Note (Signed)
Aleve - rare

## 2017-03-11 NOTE — Progress Notes (Signed)
Pre-visit discussion using our clinic review tool. No additional management support is needed unless otherwise documented below in the visit note.  

## 2017-03-11 NOTE — Assessment & Plan Note (Signed)
Wt Readings from Last 3 Encounters:  03/11/17 162 lb (73.5 kg)  11/12/16 162 lb (73.5 kg)  07/11/16 158 lb (71.7 kg)

## 2017-03-11 NOTE — Assessment & Plan Note (Signed)
chronic >12 mo off and on This could be related to leafy vegetables  Hemoccult cards CBC

## 2017-03-11 NOTE — Assessment & Plan Note (Signed)
Coreg and Amlodipine

## 2017-03-11 NOTE — Progress Notes (Signed)
Subjective:  Patient ID: Breanna [name], female    DOB: May 12, 1931  Age: 83 y.o. MRN: [id number]  CC: Follow-up (HTN, back pain, leg pain, dm type 2) and Stool Color Change (dark stool, loose, notice it several months, no abd pain or cramping, )   HPI Breanna [name] presents for h/o CVA, OA, HTN, DM f/u. Stools are dark occasionally and loose - seems to be food related x months  Outpatient Medications Prior to Visit  Medication Sig Dispense Refill  . allopurinol (ZYLOPRIM) 300 MG tablet TAKE 1 TABLET BY MOUTH EVERY DAY 90 tablet 3  . amLODipine (NORVASC) 5 MG tablet TAKE 1 TABLET BY MOUTH EVERY DAY 90 tablet 3  . aspirin 81 MG EC tablet Take 81 mg by mouth daily.      . benzonatate (TESSALON) 200 MG capsule as needed.    . brimonidine (ALPHAGAN) 0.15 % ophthalmic solution 1 drop.    Marland Kitchen CALCIUM PO Take by mouth daily.    . carvedilol (COREG) 25 MG tablet TAKE 1 TABLET BY MOUTH TWICE A DAY WITH A MEAL 180 tablet 3  . Cholecalciferol (VITAMIN D3) 2000 UNITS capsule Take 2,000 Units by mouth daily.      . clonazePAM (KLONOPIN) 0.5 MG tablet Take 1 tablet (0.5 mg total) by mouth 2 (two) times daily as needed for anxiety. 60 tablet 2  . cyanocobalamin 2000 MCG tablet Take 2,000 mcg by mouth daily.    . diphenhydramine-acetaminophen (TYLENOL PM) 25-500 MG TABS 1 tablet at bedtime as needed.    . dorzolamide-timolol (COSOPT) 22.3-6.8 MG/ML ophthalmic solution 1 drop.    Marland Kitchen gabapentin (NEURONTIN) 100 MG capsule Take 1 capsule (100 mg total) by mouth 3 (three) times daily. 270 capsule 3  . latanoprost (XALATAN) 0.005 % ophthalmic solution Place 1 drop into both eyes 2 (two) times daily.    Marland Kitchen loratadine (CLARITIN) 10 MG tablet Take 1 tablet (10 mg total) by mouth daily. 100 tablet 3  . losartan (COZAAR) 50 MG tablet TAKE 1 TABLET (50 MG TOTAL) BY MOUTH DAILY. 90 tablet 3  . metFORMIN (GLUCOPHAGE) 500 MG tablet TAKE 1 TABLET (500 MG TOTAL) BY MOUTH DAILY WITH BREAKFAST. 90 tablet 3  . ondansetron (ZOFRAN  ODT) 4 MG disintegrating tablet Take 1 tablet (4 mg total) by mouth every 8 (eight) hours as needed for nausea or vomiting. 10 tablet 0  . pantoprazole (PROTONIX) 40 MG tablet TAKE 1 TABLET BY MOUTH DAILY 90 tablet 1  . sertraline (ZOLOFT) 100 MG tablet TAKE 1 TABLET BY MOUTH EVERY DAY 90 tablet 1  . simethicone (GAS-X) 80 MG chewable tablet Chew 1 tablet (80 mg total) by mouth every 6 (six) hours as needed for flatulence. 30 tablet 0  . timolol (TIMOPTIC) 0.25 % ophthalmic solution     . tiZANidine (ZANAFLEX) 4 MG tablet Take 0.5-1 tablets (2-4 mg total) by mouth every 8 (eight) hours as needed for muscle spasms. 60 tablet 3  . torsemide (DEMADEX) 20 MG tablet TAKE 1/2 TABLET (10MG ) BY MOUTH DAILY AS NEEDED FOR EDEMA (SWELLING) 90 tablet 3  . pantoprazole (PROTONIX) 40 MG tablet Take 1 tablet (40 mg total) by mouth daily. 90 tablet 3   No facility-administered medications prior to visit.     ROS Review of Systems  Constitutional: Negative for activity change, appetite change, chills, fatigue and unexpected weight change.  HENT: Negative for congestion, mouth sores and sinus pressure.   Eyes: Negative for visual disturbance.  Respiratory: Negative for  cough and chest tightness.   Gastrointestinal: Negative for abdominal pain, blood in stool and nausea.  Genitourinary: Negative for difficulty urinating, frequency and vaginal pain.  Musculoskeletal: Negative for back pain and gait problem.  Skin: Negative for pallor and rash.  Neurological: Negative for dizziness, tremors, weakness, numbness and headaches.  Psychiatric/Behavioral: Negative for confusion and sleep disturbance.    Objective:  BP 138/88   Pulse (!) 47   Temp 98.2 F (36.8 C) (Oral)   Resp 16   Ht 5\' 2"  (1.575 m)   Wt 162 lb (73.5 kg)   SpO2 90%   BMI 29.63 kg/m   BP Readings from Last 3 Encounters:  03/11/17 138/88  11/21/16 (!) 172/78  11/12/16 (!) 168/82    Wt Readings from Last 3 Encounters:  03/11/17 162  lb (73.5 kg)  11/12/16 162 lb (73.5 kg)  07/11/16 158 lb (71.7 kg)    Physical Exam  Constitutional: She appears well-developed. No distress.  HENT:  Head: Normocephalic.  Right Ear: External ear normal.  Left Ear: External ear normal.  Nose: Nose normal.  Mouth/Throat: Oropharynx is clear and moist.  Eyes: Conjunctivae are normal. Pupils are equal, round, and reactive to light. Right eye exhibits no discharge. Left eye exhibits no discharge.  Neck: Normal range of motion. Neck supple. No JVD present. No tracheal deviation present. No thyromegaly present.  Cardiovascular: Regular rhythm and normal heart sounds.   Pulmonary/Chest: No stridor. No respiratory distress. She has no wheezes.  Abdominal: Soft. Bowel sounds are normal. She exhibits no distension and no mass. There is no tenderness. There is no rebound and no guarding.  Musculoskeletal: She exhibits tenderness. She exhibits no edema.  Lymphadenopathy:    She has no cervical adenopathy.  Neurological: She displays normal reflexes. No cranial nerve deficit. She exhibits normal muscle tone. Coordination abnormal.  Skin: No rash noted. No erythema.  Psychiatric: She has a normal mood and affect. Her behavior is normal. Judgment and thought content normal.  cane bradycardic  Lab Results  Component Value Date   WBC 5.6 01/10/2016   HGB 11.4 (L) 01/10/2016   HCT 35.8 (L) 01/10/2016   PLT 164.0 01/10/2016   GLUCOSE 108 (H) 11/12/2016   CHOL 154 10/19/2014   TRIG 56.0 10/19/2014   HDL 61.10 10/19/2014   LDLCALC 82 10/19/2014   ALT 9 10/13/2015   AST 14 10/13/2015   NA 140 11/12/2016   K 4.1 11/12/2016   CL 105 11/12/2016   CREATININE 1.11 11/12/2016   BUN 29 (H) 11/12/2016   CO2 27 11/12/2016   TSH 0.54 10/13/2015   HGBA1C 6.2 11/12/2016    Mm Screening Breast Tomo Bilateral  Result Date: 11/20/2016 CLINICAL DATA:  Screening. History of benign excisional biopsies of both breasts in 1994. EXAM: 2D DIGITAL SCREENING  BILATERAL MAMMOGRAM WITH CAD AND ADJUNCT TOMO COMPARISON:  Previous exam(s). ACR Breast Density Category b: There are scattered areas of fibroglandular density. FINDINGS: There are stable postsurgical changes within the right breast. There are no findings suspicious for malignancy within either breast. Images were processed with CAD. IMPRESSION: No mammographic evidence of malignancy. A result letter of this screening mammogram will be mailed directly to the patient. RECOMMENDATION: Screening mammogram in one year. (Code:SM-B-01Y) BI-RADS CATEGORY  2: Benign. Electronically Signed   By: Franki Cabot M.D.   On: 11/21/2016 08:50    Assessment & Plan:   There are no diagnoses linked to this encounter. I am having Ms. Hulsey maintain her aspirin, Vitamin  D3, cyanocobalamin, ondansetron, brimonidine, diphenhydramine-acetaminophen, dorzolamide-timolol, timolol, CALCIUM PO, gabapentin, clonazePAM, loratadine, latanoprost, tiZANidine, benzonatate, simethicone, allopurinol, sertraline, pantoprazole, carvedilol, amLODipine, torsemide, metFORMIN, losartan, and naproxen sodium.  Meds ordered this encounter  Medications  . naproxen sodium (ANAPROX) 220 MG tablet    Sig: Take 220 mg by mouth 2 (two) times daily as needed.     Follow-up: No Follow-up on file.  Walker Kehr, MD

## 2017-03-11 NOTE — Assessment & Plan Note (Addendum)
Baby ASA 

## 2017-03-11 NOTE — Assessment & Plan Note (Signed)
Tylenol.

## 2017-03-11 NOTE — Assessment & Plan Note (Signed)
Labs Metformin 

## 2017-06-03 ENCOUNTER — Other Ambulatory Visit: Payer: Self-pay | Admitting: Internal Medicine

## 2017-06-07 ENCOUNTER — Encounter: Payer: Self-pay | Admitting: Internal Medicine

## 2017-06-07 ENCOUNTER — Ambulatory Visit (INDEPENDENT_AMBULATORY_CARE_PROVIDER_SITE_OTHER): Payer: Medicare Other | Admitting: Internal Medicine

## 2017-06-07 ENCOUNTER — Other Ambulatory Visit: Payer: Self-pay | Admitting: Internal Medicine

## 2017-06-07 DIAGNOSIS — E1121 Type 2 diabetes mellitus with diabetic nephropathy: Secondary | ICD-10-CM | POA: Diagnosis not present

## 2017-06-07 DIAGNOSIS — I10 Essential (primary) hypertension: Secondary | ICD-10-CM

## 2017-06-07 DIAGNOSIS — I6350 Cerebral infarction due to unspecified occlusion or stenosis of unspecified cerebral artery: Secondary | ICD-10-CM

## 2017-06-07 DIAGNOSIS — L84 Corns and callosities: Secondary | ICD-10-CM

## 2017-06-07 DIAGNOSIS — M1000 Idiopathic gout, unspecified site: Secondary | ICD-10-CM

## 2017-06-07 NOTE — Assessment & Plan Note (Signed)
ASA

## 2017-06-07 NOTE — Assessment & Plan Note (Signed)
On Metformin 

## 2017-06-07 NOTE — Assessment & Plan Note (Signed)
See procedure Paring Diab shoes

## 2017-06-07 NOTE — Assessment & Plan Note (Signed)
Cont w/Coreg, Amlodipine

## 2017-06-07 NOTE — Patient Instructions (Signed)
MC well w/Jill 

## 2017-06-07 NOTE — Assessment & Plan Note (Signed)
Back brace

## 2017-06-07 NOTE — Progress Notes (Signed)
Subjective:  Patient ID: TEMPRANCE WYRE, female    DOB: 11/01/1931  Age: 83 y.o. MRN: [id number]  CC: No chief complaint on file.   HPI MARCHELLA HIBBARD presents for HTN, DM, LBP - worse. Asking for a back brace  Outpatient Medications Prior to Visit  Medication Sig Dispense Refill  . allopurinol (ZYLOPRIM) 300 MG tablet TAKE 1 TABLET BY MOUTH EVERY DAY 90 tablet 3  . amLODipine (NORVASC) 5 MG tablet TAKE 1 TABLET BY MOUTH EVERY DAY 90 tablet 3  . aspirin 81 MG EC tablet Take 81 mg by mouth daily.      . benzonatate (TESSALON) 200 MG capsule as needed.    . brimonidine (ALPHAGAN) 0.15 % ophthalmic solution 1 drop.    Marland Kitchen CALCIUM PO Take by mouth daily.    . carvedilol (COREG) 25 MG tablet TAKE 1 TABLET BY MOUTH TWICE A DAY WITH A MEAL 180 tablet 3  . Cholecalciferol (VITAMIN D3) 2000 UNITS capsule Take 2,000 Units by mouth daily.      . clonazePAM (KLONOPIN) 0.5 MG tablet Take 1 tablet (0.5 mg total) by mouth 2 (two) times daily as needed for anxiety. 60 tablet 2  . cyanocobalamin 2000 MCG tablet Take 2,000 mcg by mouth daily.    . diphenhydramine-acetaminophen (TYLENOL PM) 25-500 MG TABS 1 tablet at bedtime as needed.    . dorzolamide-timolol (COSOPT) 22.3-6.8 MG/ML ophthalmic solution 1 drop.    Marland Kitchen gabapentin (NEURONTIN) 100 MG capsule TAKE ONE CAPSULE BY MOUTH 3 TIMES A DAY 270 capsule 1  . latanoprost (XALATAN) 0.005 % ophthalmic solution Place 1 drop into both eyes 2 (two) times daily.    Marland Kitchen loratadine (CLARITIN) 10 MG tablet Take 1 tablet (10 mg total) by mouth daily. 100 tablet 3  . losartan (COZAAR) 50 MG tablet TAKE 1 TABLET (50 MG TOTAL) BY MOUTH DAILY. 90 tablet 3  . metFORMIN (GLUCOPHAGE) 500 MG tablet TAKE 1 TABLET (500 MG TOTAL) BY MOUTH DAILY WITH BREAKFAST. 90 tablet 3  . naproxen sodium (ANAPROX) 220 MG tablet Take 220 mg by mouth 2 (two) times daily as needed.    . ondansetron (ZOFRAN ODT) 4 MG disintegrating tablet Take 1 tablet (4 mg total) by mouth every 8 (eight) hours as  needed for nausea or vomiting. 10 tablet 0  . pantoprazole (PROTONIX) 40 MG tablet TAKE 1 TABLET BY MOUTH DAILY 90 tablet 1  . sertraline (ZOLOFT) 100 MG tablet TAKE 1 TABLET BY MOUTH EVERY DAY 90 tablet 1  . simethicone (GAS-X) 80 MG chewable tablet Chew 1 tablet (80 mg total) by mouth every 6 (six) hours as needed for flatulence. 30 tablet 0  . timolol (TIMOPTIC) 0.25 % ophthalmic solution     . tiZANidine (ZANAFLEX) 4 MG tablet Take 0.5-1 tablets (2-4 mg total) by mouth every 8 (eight) hours as needed for muscle spasms. 60 tablet 3  . torsemide (DEMADEX) 20 MG tablet TAKE 1/2 TABLET (10MG ) BY MOUTH DAILY AS NEEDED FOR EDEMA (SWELLING) 90 tablet 3   No facility-administered medications prior to visit.     ROS Review of Systems  Constitutional: Positive for fatigue. Negative for activity change, appetite change, chills and unexpected weight change.  HENT: Negative for congestion, mouth sores and sinus pressure.   Eyes: Negative for visual disturbance.  Respiratory: Negative for cough and chest tightness.   Gastrointestinal: Negative for abdominal pain and nausea.  Genitourinary: Negative for difficulty urinating, frequency and vaginal pain.  Musculoskeletal: Positive for back pain and  gait problem.  Skin: Negative for pallor and rash.  Neurological: Negative for dizziness, tremors, weakness, numbness and headaches.  Psychiatric/Behavioral: Negative for confusion and sleep disturbance.    Objective:  BP (!) 186/82 (BP Location: Left Arm, Patient Position: Sitting, Cuff Size: Normal)   Pulse (!) 50   Temp 98 F (36.7 C) (Oral)   Ht 5\' 2"  (1.575 m)   Wt 158 lb (71.7 kg)   SpO2 95%   BMI 28.90 kg/m   BP Readings from Last 3 Encounters:  06/07/17 (!) 186/82  03/11/17 138/88  11/21/16 (!) 172/78    Wt Readings from Last 3 Encounters:  06/07/17 158 lb (71.7 kg)  03/11/17 162 lb (73.5 kg)  11/12/16 162 lb (73.5 kg)    Physical Exam  Constitutional: She appears  well-developed. No distress.  HENT:  Head: Normocephalic.  Right Ear: External ear normal.  Left Ear: External ear normal.  Nose: Nose normal.  Mouth/Throat: Oropharynx is clear and moist.  Eyes: Conjunctivae are normal. Pupils are equal, round, and reactive to light. Right eye exhibits no discharge. Left eye exhibits no discharge.  Neck: Normal range of motion. Neck supple. No JVD present. No tracheal deviation present. No thyromegaly present.  Cardiovascular: Normal rate, regular rhythm and normal heart sounds.   Pulmonary/Chest: No stridor. No respiratory distress. She has no wheezes.  Abdominal: Soft. Bowel sounds are normal. She exhibits no distension and no mass. There is no tenderness. There is no rebound and no guarding.  Musculoskeletal: She exhibits tenderness. She exhibits no edema.  Lymphadenopathy:    She has no cervical adenopathy.  Neurological: She displays normal reflexes. No cranial nerve deficit. She exhibits normal muscle tone. Coordination abnormal.  Skin: No rash noted. No erythema.  Psychiatric: She has a normal mood and affect. Her behavior is normal. Judgment and thought content normal.  LS tender Cane R 3d toe callus  Procedure:  R  foot callus paring/cutting  Indication:    foot callus, painful  Consent: verbal  Risks and benefits were explained to the patient. Skin was cleaned with alcohol. I removed a large callus carefully with a round blade. Skin remained intact. Pain is better. Tolerated well. Complications: none. Bandaid applied    Lab Results  Component Value Date   WBC 4.7 03/11/2017   HGB 11.3 (L) 03/11/2017   HCT 35.7 (L) 03/11/2017   PLT 143.0 (L) 03/11/2017   GLUCOSE 103 (H) 03/11/2017   CHOL 154 10/19/2014   TRIG 56.0 10/19/2014   HDL 61.10 10/19/2014   LDLCALC 82 10/19/2014   ALT 10 03/11/2017   AST 19 03/11/2017   NA 141 03/11/2017   K 4.1 03/11/2017   CL 105 03/11/2017   CREATININE 1.13 03/11/2017   BUN 24 (H) 03/11/2017   CO2  27 03/11/2017   TSH 0.54 10/13/2015   HGBA1C 6.4 03/11/2017    Mm Screening Breast Tomo Bilateral  Result Date: 11/20/2016 CLINICAL DATA:  Screening. History of benign excisional biopsies of both breasts in 1994. EXAM: 2D DIGITAL SCREENING BILATERAL MAMMOGRAM WITH CAD AND ADJUNCT TOMO COMPARISON:  Previous exam(s). ACR Breast Density Category b: There are scattered areas of fibroglandular density. FINDINGS: There are stable postsurgical changes within the right breast. There are no findings suspicious for malignancy within either breast. Images were processed with CAD. IMPRESSION: No mammographic evidence of malignancy. A result letter of this screening mammogram will be mailed directly to the patient. RECOMMENDATION: Screening mammogram in one year. (Code:SM-B-01Y) BI-RADS CATEGORY  2: Benign.  Electronically Signed   By: Franki Cabot M.D.   On: 11/21/2016 08:50    Assessment & Plan:   There are no diagnoses linked to this encounter. I am having Ms. Guthridge maintain her aspirin, Vitamin D3, cyanocobalamin, ondansetron, brimonidine, diphenhydramine-acetaminophen, dorzolamide-timolol, timolol, CALCIUM PO, clonazePAM, loratadine, latanoprost, tiZANidine, benzonatate, simethicone, allopurinol, sertraline, carvedilol, amLODipine, torsemide, metFORMIN, losartan, naproxen sodium, gabapentin, and pantoprazole.  No orders of the defined types were placed in this encounter.    Follow-up: No Follow-up on file.  Walker Kehr, MD

## 2017-06-07 NOTE — Assessment & Plan Note (Signed)
No relapse 

## 2017-06-17 ENCOUNTER — Ambulatory Visit: Payer: Medicare Other | Admitting: Internal Medicine

## 2017-07-04 ENCOUNTER — Ambulatory Visit (INDEPENDENT_AMBULATORY_CARE_PROVIDER_SITE_OTHER): Payer: Medicare Other | Admitting: *Deleted

## 2017-07-04 ENCOUNTER — Telehealth: Payer: Self-pay | Admitting: *Deleted

## 2017-07-04 VITALS — BP 138/76 | HR 72 | Resp 20 | Ht 62.0 in | Wt 156.0 lb

## 2017-07-04 DIAGNOSIS — Y92009 Unspecified place in unspecified non-institutional (private) residence as the place of occurrence of the external cause: Secondary | ICD-10-CM

## 2017-07-04 DIAGNOSIS — Z0000 Encounter for general adult medical examination without abnormal findings: Secondary | ICD-10-CM

## 2017-07-04 DIAGNOSIS — W19XXXD Unspecified fall, subsequent encounter: Secondary | ICD-10-CM

## 2017-07-04 DIAGNOSIS — R55 Syncope and collapse: Secondary | ICD-10-CM

## 2017-07-04 NOTE — Telephone Encounter (Signed)
During AWV, patient stated she needed a tub bench to help keep her safe during shower activity.

## 2017-07-04 NOTE — Progress Notes (Addendum)
Subjective:   Breanna [name] is a 83 y.o. female who presents for Medicare Annual (Subsequent) preventive examination. Patient c/o right upper arm pain and shoulder pain which has been going on for several months. Her right  upper arm appears swollen. Also c/o lower back pain lumbar region.  Review of Systems:  No ROS.  Medicare Wellness Visit. Additional risk factors are reflected in the social history.  Cardiac Risk Factors include: advanced age (>64men, >57 women);diabetes mellitus;hypertension;sedentary lifestyle Sleep patterns: feels rested on waking, gets up 1-2 times nightly to void and sleeps 6-7 hours nightly.   Home Safety/Smoke Alarms: Feels safe in home. Smoke alarms in place.  Living environment; residence and Firearm Safety: 1-story house/ trailer, equipment: Radio producer, Type: [place], no firearms. Lives alone, good family and neighbor support, states she needs tub bench and PCP was notified for order. Seat Belt Safety/Bike Helmet: Wears seat belt.   Counseling:   Eye Exam- Every 4 months for glaucoma follow-up Dental- appointment every 6 months  Female:   Pap- N/A     Mammo- Last 11/20/16, BI-RADS category 2 benign      Dexa scan- Last 06/15/15,  T-score -0.5      CCS- N/A     Objective:     Vitals: BP 138/76   Pulse 72   Resp 20   Ht 5\' 2"  (1.575 m)   Wt 156 lb (70.8 kg)   SpO2 98%   BMI 28.53 kg/m   Body mass index is 28.53 kg/m.   Tobacco History  Smoking Status  . Never Smoker  Smokeless Tobacco  . Never Used     Counseling given: Not Answered   Past Medical History:  Diagnosis Date  . Adrenal adenoma 2006  . Boils 2009  . Cholelithiasis    asympt. w/normal HIDA 03/2010  . CVA (cerebral infarction) 2010   Cerebellar  . Diverticulitis   . Esophageal spasm 2011  . GERD (gastroesophageal reflux disease)   . Gout   . History of colon polyps   . HTN (hypertension)   . Hydronephrosis    LEFT/ Surgical intervention  . Hyperlipidemia   .  LBP (low back pain)   . Osteoarthritis   . Pulmonary HTN ([place])   . Stress   . Type II or unspecified type diabetes mellitus without mention of complication, not stated as uncontrolled    Past Surgical History:  Procedure Laterality Date  . ABDOMINAL HYSTERECTOMY    . BACK SURGERY    . BREAST BIOPSY     RIGHT  . CATARACT EXTRACTION, BILATERAL    . COLECTOMY  2006   Sigmoid  . FOOT SURGERY     BILATERAL  . HAMMER TOE SURGERY    . ROTATOR CUFF REPAIR  2008   RIGHT  . TOTAL KNEE ARTHROPLASTY  2003   LEFT  . VARICOSE VEIN SURGERY     vein stripping/lower extremities   Family History  Problem Relation Age of Onset  . Prostate cancer Father   . Hypertension Father   . Cancer Father        prostate  . Heart disease Mother   . Diabetes Mother   . Diabetes Other        1st degree relative  . Heart disease Other   . Hypertension Other   . Prostate cancer Maternal Uncle   . Cancer Maternal Uncle        prostate  . Breast cancer Daughter   . Cancer  Daughter        breast  . Colon cancer Neg Hx    History  Sexual Activity  . Sexual activity: Not Currently    Outpatient Encounter Prescriptions as of 07/04/2017  Medication Sig  . allopurinol (ZYLOPRIM) 300 MG tablet TAKE 1 TABLET BY MOUTH EVERY DAY  . amLODipine (NORVASC) 5 MG tablet TAKE 1 TABLET BY MOUTH EVERY DAY  . aspirin 81 MG EC tablet Take 81 mg by mouth daily.    . benzonatate (TESSALON) 200 MG capsule as needed.  . brimonidine (ALPHAGAN) 0.15 % ophthalmic solution 1 drop.  Marland Kitchen CALCIUM PO Take by mouth daily.  . carvedilol (COREG) 25 MG tablet TAKE 1 TABLET BY MOUTH TWICE A DAY WITH A MEAL  . Cholecalciferol (VITAMIN D3) 2000 UNITS capsule Take 2,000 Units by mouth daily.    . clonazePAM (KLONOPIN) 0.5 MG tablet Take 1 tablet (0.5 mg total) by mouth 2 (two) times daily as needed for anxiety.  . cyanocobalamin 2000 MCG tablet Take 2,000 mcg by mouth daily.  . diphenhydramine-acetaminophen (TYLENOL PM) 25-500 MG TABS  1 tablet at bedtime as needed.  . dorzolamide-timolol (COSOPT) 22.3-6.8 MG/ML ophthalmic solution 1 drop.  Marland Kitchen gabapentin (NEURONTIN) 100 MG capsule TAKE ONE CAPSULE BY MOUTH 3 TIMES A DAY  . latanoprost (XALATAN) 0.005 % ophthalmic solution Place 1 drop into both eyes 2 (two) times daily.  Marland Kitchen losartan (COZAAR) 50 MG tablet TAKE 1 TABLET (50 MG TOTAL) BY MOUTH DAILY.  . metFORMIN (GLUCOPHAGE) 500 MG tablet TAKE 1 TABLET (500 MG TOTAL) BY MOUTH DAILY WITH BREAKFAST.  . naproxen sodium (ANAPROX) 220 MG tablet Take 220 mg by mouth 2 (two) times daily as needed.  . pantoprazole (PROTONIX) 40 MG tablet TAKE 1 TABLET BY MOUTH DAILY  . sertraline (ZOLOFT) 100 MG tablet TAKE 1 TABLET BY MOUTH EVERY DAY  . simethicone (GAS-X) 80 MG chewable tablet Chew 1 tablet (80 mg total) by mouth every 6 (six) hours as needed for flatulence.  . timolol (TIMOPTIC) 0.25 % ophthalmic solution   . torsemide (DEMADEX) 20 MG tablet TAKE 1/2 TABLET (10MG ) BY MOUTH DAILY AS NEEDED FOR EDEMA (SWELLING)  . [DISCONTINUED] loratadine (CLARITIN) 10 MG tablet Take 1 tablet (10 mg total) by mouth daily. (Patient not taking: Reported on 07/04/2017)  . [DISCONTINUED] ondansetron (ZOFRAN ODT) 4 MG disintegrating tablet Take 1 tablet (4 mg total) by mouth every 8 (eight) hours as needed for nausea or vomiting. (Patient not taking: Reported on 07/04/2017)  . [DISCONTINUED] tiZANidine (ZANAFLEX) 4 MG tablet Take 0.5-1 tablets (2-4 mg total) by mouth every 8 (eight) hours as needed for muscle spasms. (Patient not taking: Reported on 07/04/2017)   No facility-administered encounter medications on file as of 07/04/2017.     Activities of Daily Living In your present state of health, do you have any difficulty performing the following activities: 07/04/2017  Hearing? N  Vision? Y  Difficulty concentrating or making decisions? N  Walking or climbing stairs? N  Dressing or bathing? N  Doing errands, shopping? Y  Preparing Food and eating ? N    Using the Toilet? N  In the past six months, have you accidently leaked urine? N  Do you have problems with loss of bowel control? N  Managing your Medications? N  Managing your Finances? N  Housekeeping or managing your Housekeeping? N  Some recent data might be hidden    Patient Care Team: Plotnikov, Evie Lacks, MD as PCP - General (Internal Medicine) Gaynelle Arabian,  Sigmund, MD as Consulting Physician (Urology) Melina Schools, MD as Consulting Physician (Orthopedic Surgery)    Assessment:    Physical assessment deferred to PCP.  Exercise Activities and Dietary recommendations Current Exercise Habits: The patient does not participate in regular exercise at present, Exercise limited by: None identified  Diet (meal preparation, eat out, water intake, caffeinated beverages, dairy products, fruits and vegetables): in general, a "healthy" diet  , well balanced, low salt   Reviewed heart healthy diet, encouraged patient to increase daily water intake.    Goals      Patient Stated   . divsion for the blind (pt-stated)          Will contact the Div for the blind to assist the patient in independent living with vision loss Counties Served: [place], [place], [place], [place], [place], [place], [place], [place], [place], [place], [place], [place], [place], [place], [place], [name] Phone: [phone] Toll Free: [phone] Fax: [phone] Email: [email or web address] Physical Address [address], [address] [place], [place] [address]     . transportation (pt-stated)          Will explore transportation in [place] / for [employer]: [employer]:  [phone]  / transaid      Other   . I want to increase the amount of water I drink          I will take out 3 bottles of water to drink daily. I will place them on the table so I do not forget.      Fall Risk Fall Risk  07/04/2017 06/07/2017 06/04/2016 05/30/2015 04/13/2015  Falls in  the past year? No No No Yes Yes  Number falls in past yr: - - - 1 1  Injury with Fall? - - - No No  Risk for fall due to : - - - Impaired balance/gait;Impaired vision -  Follow up - - - Falls evaluation completed -   Depression Screen PHQ 2/9 Scores 07/04/2017 06/07/2017 06/04/2016 05/30/2015  PHQ - 2 Score 0 0 0 0  Exception Documentation - - - Other- indicate reason in comment box     Cognitive Function MMSE - Mini Mental State Exam 07/04/2017 05/30/2015  Not completed: - Unable to complete  Orientation to time 5 -  Orientation to Place 5 -  Registration 3 -  Attention/ Calculation 4 -  Recall 2 -  Language- name 2 objects 2 -  Language- repeat 1 -  Language- follow 3 step command 3 -  Language- read & follow direction 1 -  Write a sentence 1 -  Copy design 1 -  Total score 28 -        Immunization History  Administered Date(s) Administered  . H1N1 01/06/2009  . Influenza Split 10/01/2011, 09/08/2012  . Influenza Whole 10/07/2009, 09/29/2010  . Influenza, High Dose Seasonal PF 10/06/2013, 11/21/2016  . Influenza-Unspecified 10/09/2014, 09/09/2015  . Pneumococcal Conjugate-13 04/07/2014  . Pneumococcal Polysaccharide-23 06/26/2007  . Td 06/26/2007  . Zoster 10/04/2011   Screening Tests Health Maintenance  Topic Date Due  . OPHTHALMOLOGY EXAM  05/29/2016  . TETANUS/TDAP  07/03/2018 (Originally 06/25/2017)  . INFLUENZA VACCINE  07/24/2017  . HEMOGLOBIN A1C  09/11/2017  . FOOT EXAM  06/07/2018  . DEXA SCAN  Completed  . PNA vac Low Risk Adult  Completed      Plan:     PCP follow-up appointment scheduled for 07/22/17 for evaluation of right arm and shoulder pain and lower back pain.  Continue doing  brain stimulating activities (puzzles, reading, adult coloring books, staying active) to keep memory sharp.   Continue to eat heart healthy diet (full of fruits, vegetables, whole grains, lean protein, water--limit salt, fat, and sugar intake) and increase physical activity  as tolerated.  I have personally reviewed and noted the following in the patient's chart:   . Medical and social history . Use of alcohol, tobacco or illicit drugs  . Current medications and supplements . Functional ability and status . Nutritional status . Physical activity . Advanced directives . List of other physicians . Vitals . Screenings to include cognitive, depression, and falls . Referrals and appointments  In addition, I have reviewed and discussed with patient certain preventive protocols, quality metrics, and best practice recommendations. A written personalized care plan for preventive services as well as general preventive health recommendations were provided to patient.     Michiel Cowboy, RN  07/04/2017   Medical screening examination/treatment/procedure(s) were performed by non-physician practitioner and as supervising physician I was immediately available for consultation/collaboration. I agree with above. Walker Kehr, MD

## 2017-07-04 NOTE — Progress Notes (Signed)
Pre visit review using our clinic review tool, if applicable. No additional management support is needed unless otherwise documented below in the visit note. 

## 2017-07-04 NOTE — Patient Instructions (Signed)
Continue doing brain stimulating activities (puzzles, reading, adult coloring books, staying active) to keep memory sharp.   Continue to eat heart healthy diet (full of fruits, vegetables, whole grains, lean protein, water--limit salt, fat, and sugar intake) and increase physical activity as tolerated.   Breanna [name] , Thank you for taking time to come for your Medicare Wellness Visit. I appreciate your ongoing commitment to your health goals. Please review the following plan we discussed and let me know if I can assist you in the future.   These are the goals we discussed: Goals      Patient Stated   . divsion for the blind (pt-stated)          Will contact the Div for the blind to assist the patient in independent living with vision loss Counties Served: [place], [place], [place], [place], [place], [place], [place], [place], [place], [place], [place], [place], [place], [place], [place], [name] Phone: [phone] Toll Free: [phone] Fax: [phone] Email: [email or web address] Physical Address [address], [place] [address] [place], [place] [address]     . transportation (pt-stated)          Will explore transportation in [place] / for [employer]: [employer]:  [phone]  / transaid      Other   . I want to increase the amount of water I drink          I will take out 3 bottles of water to drink daily. I will place them on the table so I do not forget.       This is a list of the screening recommended for you and due dates:  Health Maintenance  Topic Date Due  . Eye exam for diabetics  05/29/2016  . Tetanus Vaccine  07/03/2018*  . Flu Shot  07/24/2017  . Hemoglobin A1C  09/11/2017  . Complete foot exam   06/07/2018  . DEXA scan (bone density measurement)  Completed  . Pneumonia vaccines  Completed  *Topic was postponed. The date shown is not the original due date.

## 2017-07-05 NOTE — Telephone Encounter (Signed)
OK. Thx

## 2017-07-11 NOTE — Telephone Encounter (Signed)
Verbal order received and placed for tub bench for patients safety.

## 2017-07-22 ENCOUNTER — Ambulatory Visit: Payer: Medicare Other | Admitting: Internal Medicine

## 2017-07-24 ENCOUNTER — Telehealth: Payer: Self-pay | Admitting: Internal Medicine

## 2017-07-24 MED ORDER — LIDOCAINE 5 % EX OINT: 1.0000 "application " | TOPICAL_OINTMENT | Freq: Three times a day (TID) | CUTANEOUS | 3 refills | Status: DC | PRN

## 2017-07-24 MED ORDER — ICOSAPENT ETHYL 1 G PO CAPS: 2.0000 | ORAL_CAPSULE | Freq: Two times a day (BID) | ORAL | 11 refills | Status: DC

## 2017-07-24 NOTE — Telephone Encounter (Signed)
Done. Thx.

## 2017-07-24 NOTE — Telephone Encounter (Signed)
I do not see these medications in the pts chart please advise.

## 2017-07-24 NOTE — Telephone Encounter (Signed)
Breanna [name] from [facility] called checking to see if we have received a fax for a prescription request for Omega 3 and Lidocaine Ointment?

## 2017-07-25 ENCOUNTER — Ambulatory Visit (INDEPENDENT_AMBULATORY_CARE_PROVIDER_SITE_OTHER): Payer: Medicare Other | Admitting: Internal Medicine

## 2017-07-25 ENCOUNTER — Encounter: Payer: Self-pay | Admitting: Internal Medicine

## 2017-07-25 DIAGNOSIS — G8929 Other chronic pain: Secondary | ICD-10-CM

## 2017-07-25 DIAGNOSIS — E1121 Type 2 diabetes mellitus with diabetic nephropathy: Secondary | ICD-10-CM

## 2017-07-25 DIAGNOSIS — M25511 Pain in right shoulder: Secondary | ICD-10-CM | POA: Insufficient documentation

## 2017-07-25 MED ORDER — DICLOFENAC SODIUM 1 % TD GEL: 2.0000 g | Freq: Two times a day (BID) | TRANSDERMAL | 3 refills | Status: DC

## 2017-07-25 NOTE — Progress Notes (Signed)
Subjective:  Patient ID: Breanna [name], female    DOB: Dec 17, 1931  Age: 83 y.o. MRN: [id number]  CC: No chief complaint on file.   HPI Breanna [name] presents for DM, OA, LBP f/u C/o R shouder pain w/ROM - x weeks  Outpatient Medications Prior to Visit  Medication Sig Dispense Refill  . allopurinol (ZYLOPRIM) 300 MG tablet TAKE 1 TABLET BY MOUTH EVERY DAY 90 tablet 3  . amLODipine (NORVASC) 5 MG tablet TAKE 1 TABLET BY MOUTH EVERY DAY 90 tablet 3  . aspirin 81 MG EC tablet Take 81 mg by mouth daily.      . benzonatate (TESSALON) 200 MG capsule as needed.    . brimonidine (ALPHAGAN) 0.15 % ophthalmic solution 1 drop.    Marland Kitchen CALCIUM PO Take by mouth daily.    . carvedilol (COREG) 25 MG tablet TAKE 1 TABLET BY MOUTH TWICE A DAY WITH A MEAL 180 tablet 3  . Cholecalciferol (VITAMIN D3) 2000 UNITS capsule Take 2,000 Units by mouth daily.      . clonazePAM (KLONOPIN) 0.5 MG tablet Take 1 tablet (0.5 mg total) by mouth 2 (two) times daily as needed for anxiety. 60 tablet 2  . cyanocobalamin 2000 MCG tablet Take 2,000 mcg by mouth daily.    . diphenhydramine-acetaminophen (TYLENOL PM) 25-500 MG TABS 1 tablet at bedtime as needed.    . dorzolamide-timolol (COSOPT) 22.3-6.8 MG/ML ophthalmic solution 1 drop.    Marland Kitchen gabapentin (NEURONTIN) 100 MG capsule TAKE ONE CAPSULE BY MOUTH 3 TIMES A DAY 270 capsule 1  . Icosapent Ethyl (VASCEPA) 1 g CAPS Take 2 capsules by mouth 2 (two) times daily. 120 capsule 11  . latanoprost (XALATAN) 0.005 % ophthalmic solution Place 1 drop into both eyes 2 (two) times daily.    Marland Kitchen lidocaine (XYLOCAINE) 5 % ointment Apply 1 application topically 3 (three) times daily as needed. 150 g 3  . losartan (COZAAR) 50 MG tablet TAKE 1 TABLET (50 MG TOTAL) BY MOUTH DAILY. 90 tablet 3  . metFORMIN (GLUCOPHAGE) 500 MG tablet TAKE 1 TABLET (500 MG TOTAL) BY MOUTH DAILY WITH BREAKFAST. 90 tablet 3  . naproxen sodium (ANAPROX) 220 MG tablet Take 220 mg by mouth 2 (two) times daily as needed.      . pantoprazole (PROTONIX) 40 MG tablet TAKE 1 TABLET BY MOUTH DAILY 90 tablet 1  . sertraline (ZOLOFT) 100 MG tablet TAKE 1 TABLET BY MOUTH EVERY DAY 90 tablet 1  . simethicone (GAS-X) 80 MG chewable tablet Chew 1 tablet (80 mg total) by mouth every 6 (six) hours as needed for flatulence. 30 tablet 0  . timolol (TIMOPTIC) 0.25 % ophthalmic solution     . torsemide (DEMADEX) 20 MG tablet TAKE 1/2 TABLET (10MG ) BY MOUTH DAILY AS NEEDED FOR EDEMA (SWELLING) 90 tablet 3   No facility-administered medications prior to visit.     ROS Review of Systems  Constitutional: Negative for activity change, appetite change, chills, fatigue and unexpected weight change.  HENT: Negative for congestion, mouth sores and sinus pressure.   Eyes: Negative for visual disturbance.  Respiratory: Negative for cough and chest tightness.   Gastrointestinal: Negative for abdominal pain and nausea.  Genitourinary: Negative for difficulty urinating, frequency and vaginal pain.  Musculoskeletal: Positive for arthralgias, back pain and gait problem.  Skin: Negative for pallor and rash.  Neurological: Negative for dizziness, tremors, weakness, numbness and headaches.  Psychiatric/Behavioral: Negative for confusion and sleep disturbance.    Objective:  BP Marland Kitchen)  148/82 (BP Location: Left Arm, Patient Position: Sitting, Cuff Size: Normal)   Pulse (!) 50   Temp 98.4 F (36.9 C) (Oral)   Ht 5\' 2"  (1.575 m)   Wt 158 lb (71.7 kg)   SpO2 99%   BMI 28.90 kg/m   BP Readings from Last 3 Encounters:  07/25/17 (!) 148/82  07/04/17 138/76  06/07/17 (!) 145/80    Wt Readings from Last 3 Encounters:  07/25/17 158 lb (71.7 kg)  07/04/17 156 lb (70.8 kg)  06/07/17 158 lb (71.7 kg)    Physical Exam  Constitutional: She appears well-developed. No distress.  HENT:  Head: Normocephalic.  Right Ear: External ear normal.  Left Ear: External ear normal.  Nose: Nose normal.  Mouth/Throat: Oropharynx is clear and moist.   Eyes: Pupils are equal, round, and reactive to light. Conjunctivae are normal. Right eye exhibits no discharge. Left eye exhibits no discharge.  Neck: Normal range of motion. Neck supple. No JVD present. No tracheal deviation present. No thyromegaly present.  Cardiovascular: Normal rate, regular rhythm and normal heart sounds.   Pulmonary/Chest: No stridor. No respiratory distress. She has no wheezes.  Abdominal: Soft. Bowel sounds are normal. She exhibits no distension and no mass. There is no tenderness. There is no rebound and no guarding.  Musculoskeletal: She exhibits tenderness. She exhibits no edema.  Lymphadenopathy:    She has no cervical adenopathy.  Neurological: She displays normal reflexes. No cranial nerve deficit. She exhibits normal muscle tone. Coordination normal.  Skin: No rash noted. No erythema.  Psychiatric: She has a normal mood and affect. Her behavior is normal. Judgment and thought content normal.  LS  Tender R shoulder - tender Cane  Lab Results  Component Value Date   WBC 4.7 03/11/2017   HGB 11.3 (L) 03/11/2017   HCT 35.7 (L) 03/11/2017   PLT 143.0 (L) 03/11/2017   GLUCOSE 103 (H) 03/11/2017   CHOL 154 10/19/2014   TRIG 56.0 10/19/2014   HDL 61.10 10/19/2014   LDLCALC 82 10/19/2014   ALT 10 03/11/2017   AST 19 03/11/2017   NA 141 03/11/2017   K 4.1 03/11/2017   CL 105 03/11/2017   CREATININE 1.13 03/11/2017   BUN 24 (H) 03/11/2017   CO2 27 03/11/2017   TSH 0.54 10/13/2015   HGBA1C 6.4 03/11/2017    Mm Screening Breast Tomo Bilateral  Result Date: 11/20/2016 CLINICAL DATA:  Screening. History of benign excisional biopsies of both breasts in 1994. EXAM: 2D DIGITAL SCREENING BILATERAL MAMMOGRAM WITH CAD AND ADJUNCT TOMO COMPARISON:  Previous exam(s). ACR Breast Density Category b: There are scattered areas of fibroglandular density. FINDINGS: There are stable postsurgical changes within the right breast. There are no findings suspicious for  malignancy within either breast. Images were processed with CAD. IMPRESSION: No mammographic evidence of malignancy. A result letter of this screening mammogram will be mailed directly to the patient. RECOMMENDATION: Screening mammogram in one year. (Code:SM-B-01Y) BI-RADS CATEGORY  2: Benign. Electronically Signed   By: Franki Cabot M.D.   On: 11/21/2016 08:50    Assessment & Plan:   There are no diagnoses linked to this encounter. I am having Ms. Lawrie maintain her aspirin, Vitamin D3, cyanocobalamin, brimonidine, diphenhydramine-acetaminophen, dorzolamide-timolol, timolol, CALCIUM PO, clonazePAM, latanoprost, benzonatate, simethicone, allopurinol, carvedilol, amLODipine, torsemide, metFORMIN, losartan, naproxen sodium, gabapentin, pantoprazole, sertraline, Icosapent Ethyl, and lidocaine.  No orders of the defined types were placed in this encounter.    Follow-up: No Follow-up on file.  Walker Kehr, MD

## 2017-07-25 NOTE — Assessment & Plan Note (Signed)
Metformin 

## 2017-07-25 NOTE — Assessment & Plan Note (Signed)
Turmeric Aleve prn Voltaren gel Pt declined a steroid shot

## 2017-07-25 NOTE — Assessment & Plan Note (Signed)
Turmeric

## 2017-07-25 NOTE — Patient Instructions (Addendum)
Try Turmeric for arthritis   MC well w/Jill

## 2017-08-12 ENCOUNTER — Telehealth: Payer: Self-pay | Admitting: Internal Medicine

## 2017-08-12 NOTE — Telephone Encounter (Signed)
Patient son Breanna [name] is calling stating that his mother is sick. She is not eating and is sweating a lot. This has been going on since last Wednesday 8/15. Patient son states that at last visit Dr. Alain Marion informed them not to just got to the ED everything is is not feeling well, to come see him first.   PLEASE ADVISE IF I CAN Tony?  Thank you.

## 2017-08-12 NOTE — Telephone Encounter (Signed)
Please advise 

## 2017-08-12 NOTE — Telephone Encounter (Signed)
Pt has been informed and expressed understanding. She was urged to go to ER if it gets worse. However, she has made an appt to see Dr. Quay Burow tomorrow 08/13/17 @ 10am.

## 2017-08-12 NOTE — Telephone Encounter (Signed)
OV w/any provider ASAP ER if worse or if urgent Thx

## 2017-08-13 ENCOUNTER — Telehealth: Payer: Self-pay

## 2017-08-13 ENCOUNTER — Telehealth: Payer: Self-pay | Admitting: Internal Medicine

## 2017-08-13 ENCOUNTER — Ambulatory Visit (INDEPENDENT_AMBULATORY_CARE_PROVIDER_SITE_OTHER)
Admission: RE | Admit: 2017-08-13 | Discharge: 2017-08-13 | Disposition: A | Discharge status: Home / Self Care | Payer: Medicare Other | Source: Ambulatory Visit | Attending: Internal Medicine | Admitting: Internal Medicine

## 2017-08-13 ENCOUNTER — Other Ambulatory Visit (INDEPENDENT_AMBULATORY_CARE_PROVIDER_SITE_OTHER): Payer: Medicare Other

## 2017-08-13 ENCOUNTER — Ambulatory Visit (INDEPENDENT_AMBULATORY_CARE_PROVIDER_SITE_OTHER): Payer: Medicare Other | Admitting: Internal Medicine

## 2017-08-13 ENCOUNTER — Encounter: Payer: Self-pay | Admitting: Internal Medicine

## 2017-08-13 ENCOUNTER — Other Ambulatory Visit: Payer: Self-pay | Admitting: Internal Medicine

## 2017-08-13 VITALS — BP 204/90 | HR 71 | Temp 98.4°F | Resp 16 | Wt 150.0 lb

## 2017-08-13 DIAGNOSIS — R5383 Other fatigue: Secondary | ICD-10-CM

## 2017-08-13 DIAGNOSIS — E1121 Type 2 diabetes mellitus with diabetic nephropathy: Secondary | ICD-10-CM | POA: Diagnosis not present

## 2017-08-13 DIAGNOSIS — H538 Other visual disturbances: Secondary | ICD-10-CM | POA: Insufficient documentation

## 2017-08-13 DIAGNOSIS — R0602 Shortness of breath: Secondary | ICD-10-CM | POA: Insufficient documentation

## 2017-08-13 DIAGNOSIS — R059 Cough, unspecified: Secondary | ICD-10-CM

## 2017-08-13 DIAGNOSIS — I10 Essential (primary) hypertension: Secondary | ICD-10-CM | POA: Diagnosis not present

## 2017-08-13 DIAGNOSIS — R509 Fever, unspecified: Secondary | ICD-10-CM | POA: Diagnosis not present

## 2017-08-13 DIAGNOSIS — R05 Cough: Secondary | ICD-10-CM

## 2017-08-13 DIAGNOSIS — R918 Other nonspecific abnormal finding of lung field: Secondary | ICD-10-CM

## 2017-08-13 LAB — URINALYSIS, ROUTINE W REFLEX MICROSCOPIC
Ketones, ur: NEGATIVE
Nitrite: NEGATIVE
Specific Gravity, Urine: 1.025 (ref 1.000–1.030)
Total Protein, Urine: >=300 — AB
Urine Glucose: NEGATIVE
Urobilinogen, UA: 2 — AB (ref 0.0–1.0)
pH: 6 (ref 5.0–8.0)

## 2017-08-13 LAB — CBC WITH DIFFERENTIAL/PLATELET
Basophils Absolute: 0 10*3/uL (ref 0.0–0.1)
Basophils Relative: 0.2 % (ref 0.0–3.0)
Eosinophils Absolute: 0 10*3/uL (ref 0.0–0.7)
Eosinophils Relative: 0 % (ref 0.0–5.0)
HCT: 31.3 % — ABNORMAL LOW (ref 36.0–46.0)
Hemoglobin: 10 g/dL — ABNORMAL LOW (ref 12.0–15.0)
Lymphocytes Relative: 3.4 % — ABNORMAL LOW (ref 12.0–46.0)
Lymphs Abs: 0.8 10*3/uL (ref 0.7–4.0)
MCHC: 31.9 g/dL (ref 30.0–36.0)
MCV: 88.8 fl (ref 78.0–100.0)
Monocytes Absolute: 1.3 10*3/uL — ABNORMAL HIGH (ref 0.1–1.0)
Monocytes Relative: 5.6 % (ref 3.0–12.0)
Neutro Abs: 21.1 10*3/uL — ABNORMAL HIGH (ref 1.4–7.7)
Neutrophils Relative %: 90.8 % — ABNORMAL HIGH (ref 43.0–77.0)
Platelets: 264 10*3/uL (ref 150.0–400.0)
RBC: 3.52 Mil/uL — ABNORMAL LOW (ref 3.87–5.11)
RDW: 16.8 % — ABNORMAL HIGH (ref 11.5–15.5)
WBC: 23.3 10*3/uL (ref 4.0–10.5)

## 2017-08-13 LAB — COMPREHENSIVE METABOLIC PANEL
ALT: 9 U/L (ref 0–35)
AST: 15 U/L (ref 0–37)
Albumin: 3.1 g/dL — ABNORMAL LOW (ref 3.5–5.2)
Alkaline Phosphatase: 92 U/L (ref 39–117)
BUN: 25 mg/dL — ABNORMAL HIGH (ref 6–23)
CO2: 27 mEq/L (ref 19–32)
Calcium: 9.6 mg/dL (ref 8.4–10.5)
Chloride: 100 mEq/L (ref 96–112)
Creatinine, Ser: 1.22 mg/dL — ABNORMAL HIGH (ref 0.40–1.20)
GFR: 54.01 mL/min — ABNORMAL LOW (ref >60.00)
Glucose, Bld: 135 mg/dL — ABNORMAL HIGH (ref 70–99)
Potassium: 3.6 mEq/L (ref 3.5–5.1)
Sodium: 136 mEq/L (ref 135–145)
Total Bilirubin: 1 mg/dL (ref 0.2–1.2)
Total Protein: 7.8 g/dL (ref 6.0–8.3)

## 2017-08-13 LAB — HEMOGLOBIN A1C: Hgb A1c MFr Bld: 6.8 % — ABNORMAL HIGH (ref 4.6–6.5)

## 2017-08-13 MED ORDER — NITROFURANTOIN MONOHYD MACRO 100 MG PO CAPS: 100.0000 mg | ORAL_CAPSULE | Freq: Two times a day (BID) | ORAL | 0 refills | Status: DC

## 2017-08-13 NOTE — Assessment & Plan Note (Signed)
Over the past week Possibly related to an infection-check blood work and urine tests CBC, CMP, urinalysis, urine culture

## 2017-08-13 NOTE — Assessment & Plan Note (Signed)
Having sweats at night for the past week, subjective fevers Chest x-ray to rule out pneumonia CBC, CMP Urinalysis, urine culture to rule out UTI

## 2017-08-13 NOTE — Assessment & Plan Note (Signed)
Did not take metformin for a couple of days, but has restarted today She is eating less, but is experiencing increased blurry vision Will check A1c

## 2017-08-13 NOTE — Assessment & Plan Note (Signed)
Not controlled, likely related to not taking medication for the past 2 days, but she didn't take it today Advised her daughter to check her blood pressure at home, which she states she could do Follow-up in one week to make sure blood pressure is controlled-it has been previously controlled on current medications Stressed the importance of taking her medication daily

## 2017-08-13 NOTE — Patient Instructions (Addendum)
A chest xray,urine tests and blood work was ordered.  Test(s) ordered today. Your results will be released to [place] (or called to you) after review, usually within 72hours after test completion. If any changes need to be made, you will be notified at that same time.   Medications reviewed and updated.  No changes recommended at this time.   Please followup in one week

## 2017-08-13 NOTE — Telephone Encounter (Signed)
Patient son called stating that patient is legally blind.  States she has been having issues taking medication in the evening.  Believes patient is confusing medication.  Would like to know if an order to home health can be placed off of OV today with Dr. Quay Burow to assist patient with medication dispensing.

## 2017-08-13 NOTE — Assessment & Plan Note (Signed)
Dry cough for the past couple of days, shortness of breath today Given sweating/fevers will check a chest x-ray to rule out pneumonia

## 2017-08-13 NOTE — Telephone Encounter (Signed)
HHA is usually not a covered service. We can have a [place] RN to visit her at home. Thx

## 2017-08-13 NOTE — Telephone Encounter (Signed)
CRITICAL VALUE STICKER  CRITICAL VALUE: WBC- 23.3  Karen from lab called will critical lab value. Please advise.

## 2017-08-13 NOTE — Telephone Encounter (Signed)
See result note.  

## 2017-08-13 NOTE — Assessment & Plan Note (Signed)
Intermittent and sounds chronic, but increased recently Will check A1c Stressed compliance with blood pressure medication

## 2017-08-13 NOTE — Progress Notes (Signed)
Subjective:    Patient ID: Breanna [name], female    DOB: 1931/07/03, 83 y.o.   MRN: [id number]  HPI She is here for an acute visit For not feeling well.   She has been having heavy sweats at night for over one week.  She is not sleeping - she wakes up wet from the sweating.  She has had some subjective fevers at times. Her energy level is low and she feels weak.  She has blurry vision, more than usual.  She is not eating.  She has had a dry cough for the past 2-3 days. She had some SOB today when coming here which is new.  She denies shortness of breath at home, but has been relatively sedentary.  She did not any of her medications 2 days ago and yesterday because she did not feel well.  She did take her medications today.  She does not monitor her blood pressure at home.  Over the past few weeks she has lost 3 close family members/ friends.  She is taking her sertraline daily. She does not feel this dose needs to be increased at this time.  Medications and allergies reviewed with patient and updated if appropriate.  Patient Active Problem List   Diagnosis Date Noted  . Shoulder pain, right 07/25/2017  . Callus of foot 06/07/2017  . Need for prophylactic vaccination and inoculation against influenza 11/21/2016  . Trigger finger, acquired 11/12/2016  . Meteorism 07/11/2016  . Dark stools 01/10/2016  . Osteoporosis 07/13/2015  . Fall at home 01/13/2015  . Swelling of left knee joint 04/07/2014  . Grief 01/04/2014  . Syncope 01/04/2014  . Thoracic spine pain 10/22/2013  . Hypernatremia 07/07/2013  . Viral intestinal infection 03/25/2013  . Dehydration, moderate 03/24/2013  . Food poisoning 03/24/2013  . Headache(784.0) 12/22/2012  . URI (upper respiratory infection) 10/21/2012  . Diabetic neuropathy ([place]) 07/30/2012  . Neck pain on right side 07/30/2012  . Cerumen impaction 07/17/2012  . Weight loss 04/10/2012  . Wrist pain, right 04/06/2011  . PARESTHESIA 01/03/2011  . FULL  INCONTINENCE OF FECES 01/01/2011  . ABDOMINAL PAIN RIGHT UPPER QUADRANT 01/01/2011  . LEG PAIN, LEFT 10/02/2010  . Cerebral artery occlusion with cerebral infarction ([place]) 03/27/2010  . GERD 03/27/2010  . DIVERTICULOSIS-COLON 03/27/2010  . CHOLELITHIASIS 03/27/2010  . LOW BACK PAIN 09/07/2009  . ANEMIA, IRON DEFICIENCY, HX OF 06/14/2009  . DYSPHAGIA 05/16/2009  . HYPERLIPIDEMIA 11/06/2007  . Type 2 diabetes mellitus with renal manifestations, controlled ([place]) 07/29/2007  . Gout 07/29/2007  . Essential hypertension 07/29/2007  . HYDRONEPHROSIS 07/29/2007  . Osteoarthritis 07/29/2007  . COLONIC POLYPS, HX OF 07/29/2007  . DIVERTICULITIS, HX OF 07/29/2007    Current Outpatient Prescriptions on File Prior to Visit  Medication Sig Dispense Refill  . allopurinol (ZYLOPRIM) 300 MG tablet TAKE 1 TABLET BY MOUTH EVERY DAY 90 tablet 3  . amLODipine (NORVASC) 5 MG tablet TAKE 1 TABLET BY MOUTH EVERY DAY 90 tablet 3  . aspirin 81 MG EC tablet Take 81 mg by mouth daily.      . brimonidine (ALPHAGAN) 0.15 % ophthalmic solution 1 drop.    Marland Kitchen CALCIUM PO Take by mouth daily.    . carvedilol (COREG) 25 MG tablet TAKE 1 TABLET BY MOUTH TWICE A DAY WITH A MEAL 180 tablet 3  . Cholecalciferol (VITAMIN D3) 2000 UNITS capsule Take 2,000 Units by mouth daily.      . clonazePAM (KLONOPIN) 0.5 MG tablet Take  1 tablet (0.5 mg total) by mouth 2 (two) times daily as needed for anxiety. 60 tablet 2  . cyanocobalamin 2000 MCG tablet Take 2,000 mcg by mouth daily.    . diclofenac sodium (VOLTAREN) 1 % GEL Apply 2 g topically 2 (two) times daily. 200 g 3  . diphenhydramine-acetaminophen (TYLENOL PM) 25-500 MG TABS 1 tablet at bedtime as needed.    . dorzolamide-timolol (COSOPT) 22.3-6.8 MG/ML ophthalmic solution 1 drop.    Marland Kitchen gabapentin (NEURONTIN) 100 MG capsule TAKE ONE CAPSULE BY MOUTH 3 TIMES A DAY 270 capsule 1  . Icosapent Ethyl (VASCEPA) 1 g CAPS Take 2 capsules by mouth 2 (two) times daily. 120 capsule 11    . latanoprost (XALATAN) 0.005 % ophthalmic solution Place 1 drop into both eyes 2 (two) times daily.    Marland Kitchen lidocaine (XYLOCAINE) 5 % ointment Apply 1 application topically 3 (three) times daily as needed. 150 g 3  . losartan (COZAAR) 50 MG tablet TAKE 1 TABLET (50 MG TOTAL) BY MOUTH DAILY. 90 tablet 3  . metFORMIN (GLUCOPHAGE) 500 MG tablet TAKE 1 TABLET (500 MG TOTAL) BY MOUTH DAILY WITH BREAKFAST. 90 tablet 3  . naproxen sodium (ANAPROX) 220 MG tablet Take 220 mg by mouth 2 (two) times daily as needed.    . pantoprazole (PROTONIX) 40 MG tablet TAKE 1 TABLET BY MOUTH DAILY 90 tablet 1  . sertraline (ZOLOFT) 100 MG tablet TAKE 1 TABLET BY MOUTH EVERY DAY 90 tablet 1  . simethicone (GAS-X) 80 MG chewable tablet Chew 1 tablet (80 mg total) by mouth every 6 (six) hours as needed for flatulence. 30 tablet 0  . timolol (TIMOPTIC) 0.25 % ophthalmic solution     . torsemide (DEMADEX) 20 MG tablet TAKE 1/2 TABLET (10MG ) BY MOUTH DAILY AS NEEDED FOR EDEMA (SWELLING) 90 tablet 3   No current facility-administered medications on file prior to visit.     Past Medical History:  Diagnosis Date  . Adrenal adenoma 2006  . Boils 2009  . Cholelithiasis    asympt. w/normal HIDA 03/2010  . CVA (cerebral infarction) 2010   Cerebellar  . Diverticulitis   . Esophageal spasm 2011  . GERD (gastroesophageal reflux disease)   . Gout   . History of colon polyps   . HTN (hypertension)   . Hydronephrosis    LEFT/ Surgical intervention  . Hyperlipidemia   . LBP (low back pain)   . Osteoarthritis   . Pulmonary HTN ([place])   . Stress   . Type II or unspecified type diabetes mellitus without mention of complication, not stated as uncontrolled     Past Surgical History:  Procedure Laterality Date  . ABDOMINAL HYSTERECTOMY    . BACK SURGERY    . BREAST BIOPSY     RIGHT  . CATARACT EXTRACTION, BILATERAL    . COLECTOMY  2006   Sigmoid  . FOOT SURGERY     BILATERAL  . HAMMER TOE SURGERY    . ROTATOR CUFF  REPAIR  2008   RIGHT  . TOTAL KNEE ARTHROPLASTY  2003   LEFT  . VARICOSE VEIN SURGERY     vein stripping/lower extremities    Social History   Social History  . Marital status: Married    Spouse name: Inez Stantz  . Number of children: N/A  . Years of education: N/A   Occupational History  . Retired Retired   Social History Main Topics  . Smoking status: Never Smoker  . Smokeless tobacco: Never  Used  . Alcohol use No  . Drug use: No  . Sexual activity: Not Currently   Other Topics Concern  . Not on file   Social History Narrative  . No narrative on file    Family History  Problem Relation Age of Onset  . Prostate cancer Father   . Hypertension Father   . Cancer Father        prostate  . Heart disease Mother   . Diabetes Mother   . Diabetes Other        1st degree relative  . Heart disease Other   . Hypertension Other   . Prostate cancer Maternal Uncle   . Cancer Maternal Uncle        prostate  . Breast cancer Daughter   . Cancer Daughter        breast  . Colon cancer Neg Hx     Review of Systems  Constitutional: Positive for appetite change (decreased), diaphoresis (at night), fatigue and fever (subjective). Negative for chills.  HENT: Negative for congestion, sinus pressure and sore throat.   Eyes: Positive for visual disturbance (blurry vision).  Respiratory: Positive for cough (2-3 days, dry) and shortness of breath. Negative for wheezing.   Cardiovascular: Negative for chest pain, palpitations and leg swelling.  Gastrointestinal: Negative for abdominal pain, blood in stool (no black stool), constipation, diarrhea and nausea.  Genitourinary: Negative for dysuria, frequency and hematuria.       No change in urine odor or color  Neurological: Positive for light-headedness (not new, intermittent) and headaches.  Psychiatric/Behavioral: Positive for sleep disturbance.       Objective:   Vitals:   08/13/17 1024  BP: (!) 204/90  Pulse: 71  Resp:  16  Temp: 98.4 F (36.9 C)  SpO2: 97%   Filed Weights   08/13/17 1024  Weight: 150 lb (68 kg)   Body mass index is 27.44 kg/m.  Wt Readings from Last 3 Encounters:  08/13/17 150 lb (68 kg)  07/25/17 158 lb (71.7 kg)  07/04/17 156 lb (70.8 kg)     Physical Exam  Constitutional: She is oriented to person, place, and time. She appears well-developed and well-nourished. No distress.  HENT:  Head: Normocephalic and atraumatic.  Right Ear: External ear normal.  Left Ear: External ear normal.  Nose: Nose normal.  Mouth/Throat: Oropharynx is clear and moist. No oropharyngeal exudate.  Eyes: Conjunctivae are normal.  Neck: Neck supple. No tracheal deviation present. No thyromegaly present.  Cardiovascular: Normal rate and regular rhythm.   No murmur heard. Pulmonary/Chest: Effort normal and breath sounds normal. No respiratory distress. She has no wheezes. She has no rales.  Abdominal: Soft. She exhibits no distension. There is no tenderness. There is no rebound and no guarding.  Musculoskeletal: She exhibits no edema.  Lymphadenopathy:    She has no cervical adenopathy.  Neurological: She is alert and oriented to person, place, and time.  Skin: Skin is warm and dry. No rash noted. She is not diaphoretic.  Psychiatric:  Slightly depressed mood          Assessment & Plan:   See Problem List for Assessment and Plan of chronic medical problems.

## 2017-08-14 NOTE — Telephone Encounter (Signed)
Notified pt son w/MD response../lmb 

## 2017-08-15 ENCOUNTER — Telehealth: Payer: Self-pay | Admitting: Internal Medicine

## 2017-08-15 ENCOUNTER — Ambulatory Visit (INDEPENDENT_AMBULATORY_CARE_PROVIDER_SITE_OTHER)
Admission: RE | Admit: 2017-08-15 | Discharge: 2017-08-15 | Disposition: A | Discharge status: Home / Self Care | Payer: Medicare Other | Source: Ambulatory Visit | Attending: Internal Medicine | Admitting: Internal Medicine

## 2017-08-15 DIAGNOSIS — R918 Other nonspecific abnormal finding of lung field: Secondary | ICD-10-CM

## 2017-08-15 NOTE — Telephone Encounter (Signed)
Tried contacting pt, unable to LVM 

## 2017-08-15 NOTE — Telephone Encounter (Signed)
Ct scan shows a lung mass concerning for possible cancer.  We need to do additional testing.   We will do a PET scan  - this has been ordered.

## 2017-08-16 ENCOUNTER — Other Ambulatory Visit: Payer: Self-pay | Admitting: Internal Medicine

## 2017-08-16 LAB — URINE CULTURE

## 2017-08-16 MED ORDER — AMOXICILLIN-POT CLAVULANATE 875-125 MG PO TABS: 1.0000 | ORAL_TABLET | Freq: Two times a day (BID) | ORAL | 0 refills | Status: DC

## 2017-08-16 NOTE — Telephone Encounter (Signed)
Spoke with pt to inform.  

## 2017-08-20 ENCOUNTER — Other Ambulatory Visit: Payer: Self-pay | Admitting: Internal Medicine

## 2017-08-20 DIAGNOSIS — R918 Other nonspecific abnormal finding of lung field: Secondary | ICD-10-CM

## 2017-08-21 ENCOUNTER — Ambulatory Visit (INDEPENDENT_AMBULATORY_CARE_PROVIDER_SITE_OTHER): Payer: Medicare Other | Admitting: Internal Medicine

## 2017-08-21 ENCOUNTER — Encounter: Payer: Self-pay | Admitting: Internal Medicine

## 2017-08-21 VITALS — BP 180/90 | HR 50 | Temp 98.5°F | Ht 62.0 in | Wt 146.0 lb

## 2017-08-21 DIAGNOSIS — I10 Essential (primary) hypertension: Secondary | ICD-10-CM | POA: Diagnosis not present

## 2017-08-21 DIAGNOSIS — N390 Urinary tract infection, site not specified: Secondary | ICD-10-CM | POA: Insufficient documentation

## 2017-08-21 DIAGNOSIS — N10 Acute tubulo-interstitial nephritis: Secondary | ICD-10-CM

## 2017-08-21 DIAGNOSIS — R627 Adult failure to thrive: Secondary | ICD-10-CM | POA: Diagnosis not present

## 2017-08-21 DIAGNOSIS — R911 Solitary pulmonary nodule: Secondary | ICD-10-CM | POA: Insufficient documentation

## 2017-08-21 DIAGNOSIS — R197 Diarrhea, unspecified: Secondary | ICD-10-CM | POA: Insufficient documentation

## 2017-08-21 NOTE — Assessment & Plan Note (Addendum)
On a high sodium diet...- go to [facility] diet Check BP at home - report to me

## 2017-08-21 NOTE — Assessment & Plan Note (Addendum)
Try Imodium AD Finish augmentin

## 2017-08-21 NOTE — Addendum Note (Signed)
Addended by: Cassandria Anger on: 08/21/2017 12:05 PM   Modules accepted: Orders

## 2017-08-21 NOTE — Assessment & Plan Note (Signed)
Klebsiella - augmentn

## 2017-08-21 NOTE — Progress Notes (Addendum)
Subjective:  Patient ID: Breanna [name], female    DOB: [birth year]  Age: 83 y.o. MRN: [id number]  CC: No chief complaint on file.   HPI Breanna [name] presents for fatigue, weakness, listless, no appetite. Staying in bed a lot... On Rx for UTI PET scan 09/02/17. C/o diarrhea - started before abx On a high sodium diet...  Outpatient Medications Prior to Visit  Medication Sig Dispense Refill  . allopurinol (ZYLOPRIM) 300 MG tablet TAKE 1 TABLET BY MOUTH EVERY DAY 90 tablet 3  . amLODipine (NORVASC) 5 MG tablet TAKE 1 TABLET BY MOUTH EVERY DAY 90 tablet 3  . amoxicillin-clavulanate (AUGMENTIN) 875-125 MG tablet Take 1 tablet by mouth 2 (two) times daily. 14 tablet 0  . aspirin 81 MG EC tablet Take 81 mg by mouth daily.      . brimonidine (ALPHAGAN) 0.15 % ophthalmic solution 1 drop.    Marland Kitchen CALCIUM PO Take by mouth daily.    . carvedilol (COREG) 25 MG tablet TAKE 1 TABLET BY MOUTH TWICE A DAY WITH A MEAL 180 tablet 3  . Cholecalciferol (VITAMIN D3) 2000 UNITS capsule Take 2,000 Units by mouth daily.      . clonazePAM (KLONOPIN) 0.5 MG tablet Take 1 tablet (0.5 mg total) by mouth 2 (two) times daily as needed for anxiety. 60 tablet 2  . cyanocobalamin 2000 MCG tablet Take 2,000 mcg by mouth daily.    . diclofenac sodium (VOLTAREN) 1 % GEL Apply 2 g topically 2 (two) times daily. 200 g 3  . diphenhydramine-acetaminophen (TYLENOL PM) 25-500 MG TABS 1 tablet at bedtime as needed.    . dorzolamide-timolol (COSOPT) 22.3-6.8 MG/ML ophthalmic solution 1 drop.    Marland Kitchen gabapentin (NEURONTIN) 100 MG capsule TAKE ONE CAPSULE BY MOUTH 3 TIMES A DAY 270 capsule 1  . Icosapent Ethyl (VASCEPA) 1 g CAPS Take 2 capsules by mouth 2 (two) times daily. 120 capsule 11  . latanoprost (XALATAN) 0.005 % ophthalmic solution Place 1 drop into both eyes 2 (two) times daily.    Marland Kitchen lidocaine (XYLOCAINE) 5 % ointment Apply 1 application topically 3 (three) times daily as needed. 150 g 3  . losartan (COZAAR) 50 MG tablet TAKE 1  TABLET (50 MG TOTAL) BY MOUTH DAILY. 90 tablet 3  . metFORMIN (GLUCOPHAGE) 500 MG tablet TAKE 1 TABLET (500 MG TOTAL) BY MOUTH DAILY WITH BREAKFAST. 90 tablet 3  . naproxen sodium (ANAPROX) 220 MG tablet Take 220 mg by mouth 2 (two) times daily as needed.    . pantoprazole (PROTONIX) 40 MG tablet TAKE 1 TABLET BY MOUTH DAILY 90 tablet 1  . sertraline (ZOLOFT) 100 MG tablet TAKE 1 TABLET BY MOUTH EVERY DAY 90 tablet 1  . simethicone (GAS-X) 80 MG chewable tablet Chew 1 tablet (80 mg total) by mouth every 6 (six) hours as needed for flatulence. 30 tablet 0  . timolol (TIMOPTIC) 0.25 % ophthalmic solution     . torsemide (DEMADEX) 20 MG tablet TAKE 1/2 TABLET (10MG ) BY MOUTH DAILY AS NEEDED FOR EDEMA (SWELLING) 90 tablet 3   No facility-administered medications prior to visit.     ROS Review of Systems  Constitutional: Positive for fatigue and unexpected weight change. Negative for activity change, appetite change and chills.  HENT: Negative for congestion, mouth sores and sinus pressure.   Eyes: Negative for visual disturbance.  Respiratory: Negative for cough and chest tightness.   Gastrointestinal: Positive for diarrhea. Negative for abdominal pain and nausea.  Genitourinary: Negative for  difficulty urinating, frequency and vaginal pain.  Musculoskeletal: Positive for arthralgias, back pain and gait problem.  Skin: Negative for pallor and rash.  Neurological: Positive for weakness. Negative for dizziness, tremors, numbness and headaches.  Psychiatric/Behavioral: Positive for decreased concentration and dysphoric mood. Negative for confusion, sleep disturbance and suicidal ideas.    Objective:  BP (!) 202/88 (BP Location: Left Arm, Patient Position: Sitting, Cuff Size: Normal)   Pulse (!) 50   Temp 98.5 F (36.9 C) (Oral)   Ht 5\' 2"  (1.575 m)   Wt 146 lb (66.2 kg)   SpO2 98%   BMI 26.70 kg/m   BP Readings from Last 3 Encounters:  08/21/17 (!) 202/88  08/13/17 (!) 204/90    07/25/17 (!) 148/82    Wt Readings from Last 3 Encounters:  08/21/17 146 lb (66.2 kg)  08/13/17 150 lb (68 kg)  07/25/17 158 lb (71.7 kg)    Physical Exam  Constitutional: She appears well-developed. No distress.  HENT:  Head: Normocephalic.  Right Ear: External ear normal.  Left Ear: External ear normal.  Nose: Nose normal.  Mouth/Throat: Oropharynx is clear and moist.  Eyes: Pupils are equal, round, and reactive to light. Conjunctivae are normal. Right eye exhibits no discharge. Left eye exhibits no discharge.  Neck: Normal range of motion. Neck supple. No JVD present. No tracheal deviation present. No thyromegaly present.  Cardiovascular: Normal rate, regular rhythm and normal heart sounds.   Pulmonary/Chest: No stridor. No respiratory distress. She has no wheezes.  Abdominal: Soft. Bowel sounds are normal. She exhibits no distension and no mass. There is no tenderness. There is no rebound and no guarding.  Musculoskeletal: She exhibits no edema or tenderness.  Lymphadenopathy:    She has no cervical adenopathy.  Neurological: She displays normal reflexes. No cranial nerve deficit. She exhibits normal muscle tone. Coordination abnormal.  Skin: No rash noted. No erythema.  Psychiatric: Her behavior is normal. Judgment and thought content normal.  Cane Sad Apathetic   Lab Results  Component Value Date   WBC 23.3 Repeated and verified X2. (HH) 08/13/2017   HGB 10.0 (L) 08/13/2017   HCT 31.3 (L) 08/13/2017   PLT 264.0 08/13/2017   GLUCOSE 135 (H) 08/13/2017   CHOL 154 10/19/2014   TRIG 56.0 10/19/2014   HDL 61.10 10/19/2014   LDLCALC 82 10/19/2014   ALT 9 08/13/2017   AST 15 08/13/2017   NA 136 08/13/2017   K 3.6 08/13/2017   CL 100 08/13/2017   CREATININE 1.22 (H) 08/13/2017   BUN 25 (H) 08/13/2017   CO2 27 08/13/2017   TSH 0.54 10/13/2015   HGBA1C 6.8 (H) 08/13/2017    Ct Chest Wo Contrast  Result Date: 08/15/2017 CLINICAL DATA:  Left upper lobe density  seen on recent chest x-ray. EXAM: CT CHEST WITHOUT CONTRAST TECHNIQUE: Multidetector CT imaging of the chest was performed following the standard protocol without IV contrast. COMPARISON:  CT chest dated March 26, 2010. FINDINGS: Cardiovascular: Heart size is borderline enlarged. No pericardial effusion. Atherosclerotic vascular calcifications of the left anterior descending and circumflex coronary arteries. Mediastinum/Nodes: No enlarged mediastinal or axillary lymph nodes. Calcified mediastinal hilar lymph nodes are again noted. Unchanged heterogeneous thyroid gland. Esophagus is unremarkable. Lungs/Pleura: There is no finding corresponding to the small nodular density seen on recent chest x-ray. Interval increase in size of a now more solid appearing nodule in the superior segment of the left lower lobe, measuring 9 x 9 x 14 mm, previously 8 x 7 x  10 mm (series 3, image 40). Small calcified granuloma in the anterior segment of the right upper lobe. Upper Abdomen: No acute abnormality. Stable calcified granulomas in the liver and spleen. Unchanged right adrenal adenoma. Musculoskeletal: No chest wall mass or suspicious bone lesions identified. IMPRESSION: 1. No finding corresponding to the small nodular density seen on recent chest x-ray. 2. Interval increase in size of a ground-glass nodule in the superior segment of the left lower lobe, now measuring 14 mm and CC dimension, previously 10 mm. Adenocarcinoma is considered. PET/CT or tissue sampling should be considered. This recommendation follows the consensus statement: Guidelines for Management of Incidental Pulmonary Nodules Detected on CT Images: From the Fleischner Society 2017; Radiology 2017; 284:228-243. These results will be called to the ordering clinician or representative by the Radiologist Assistant, and communication documented in the PACS or zVision Dashboard. Electronically Signed   By: Titus Dubin M.D.   On: 08/15/2017 15:37    Assessment &  Plan:   There are no diagnoses linked to this encounter. I am having Ms. Klaus maintain her aspirin, Vitamin D3, cyanocobalamin, brimonidine, diphenhydramine-acetaminophen, dorzolamide-timolol, timolol, CALCIUM PO, clonazePAM, latanoprost, simethicone, allopurinol, carvedilol, amLODipine, torsemide, metFORMIN, losartan, naproxen sodium, gabapentin, pantoprazole, sertraline, Icosapent Ethyl, lidocaine, diclofenac sodium, and amoxicillin-clavulanate.  No orders of the defined types were placed in this encounter.    Follow-up: No Follow-up on file.  Walker Kehr, MD

## 2017-08-21 NOTE — Assessment & Plan Note (Signed)
PET scan 09/02/17

## 2017-08-21 NOTE — Patient Instructions (Signed)
Try Imodium AD     DASH Eating Plan DASH stands for "Dietary Approaches to Stop Hypertension." The DASH eating plan is a healthy eating plan that has been shown to reduce high blood pressure (hypertension). It may also reduce your risk for type 2 diabetes, heart disease, and stroke. The DASH eating plan may also help with weight loss. What are tips for following this plan? General guidelines  Avoid eating more than 2,300 mg (milligrams) of salt (sodium) a day. If you have hypertension, you may need to reduce your sodium intake to 1,500 mg a day.  Limit alcohol intake to no more than 1 drink a day for nonpregnant women and 2 drinks a day for men. One drink equals 12 oz of beer, 5 oz of wine, or 1 oz of hard liquor.  Work with your health care provider to maintain a healthy body weight or to lose weight. Ask what an ideal weight is for you.  Get at least 30 minutes of exercise that causes your heart to beat faster (aerobic exercise) most days of the week. Activities may include walking, swimming, or biking.  Work with your health care provider or diet and nutrition specialist (dietitian) to adjust your eating plan to your individual calorie needs. Reading food labels  Check food labels for the amount of sodium per serving. Choose foods with less than 5 percent of the Daily Value of sodium. Generally, foods with less than 300 mg of sodium per serving fit into this eating plan.  To find whole grains, look for the word "whole" as the first word in the ingredient list. Shopping  Buy products labeled as "low-sodium" or "no salt added."  Buy fresh foods. Avoid canned foods and premade or frozen meals. Cooking  Avoid adding salt when cooking. Use salt-free seasonings or herbs instead of table salt or sea salt. Check with your health care provider or pharmacist before using salt substitutes.  Do not fry foods. Cook foods using healthy methods such as baking, boiling, grilling, and broiling  instead.  Cook with heart-healthy oils, such as olive, canola, soybean, or sunflower oil. Meal planning   Eat a balanced diet that includes: ? 5 or more servings of fruits and vegetables each day. At each meal, try to fill half of your plate with fruits and vegetables. ? Up to 6-8 servings of whole grains each day. ? Less than 6 oz of lean meat, poultry, or fish each day. A 3-oz serving of meat is about the same size as a deck of cards. One egg equals 1 oz. ? 2 servings of low-fat dairy each day. ? A serving of nuts, seeds, or beans 5 times each week. ? Heart-healthy fats. Healthy fats called Omega-3 fatty acids are found in foods such as flaxseeds and coldwater fish, like sardines, salmon, and mackerel.  Limit how much you eat of the following: ? Canned or prepackaged foods. ? Food that is high in trans fat, such as fried foods. ? Food that is high in saturated fat, such as fatty meat. ? Sweets, desserts, sugary drinks, and other foods with added sugar. ? Full-fat dairy products.  Do not salt foods before eating.  Try to eat at least 2 vegetarian meals each week.  Eat more home-cooked food and less restaurant, buffet, and fast food.  When eating at a restaurant, ask that your food be prepared with less salt or no salt, if possible. What foods are recommended? The items listed may not be a complete  list. Talk with your dietitian about what dietary choices are best for you. Grains Whole-grain or whole-wheat bread. Whole-grain or whole-wheat pasta. Brown rice. [place]. Bulgur. Whole-grain and low-sodium cereals. Pita bread. Low-fat, low-sodium crackers. Whole-wheat flour tortillas. Vegetables Fresh or frozen vegetables (raw, steamed, roasted, or grilled). Low-sodium or reduced-sodium tomato and vegetable juice. Low-sodium or reduced-sodium tomato sauce and tomato paste. Low-sodium or reduced-sodium canned vegetables. Fruits All fresh, dried, or frozen fruit. Canned fruit in  natural juice (without added sugar). Meat and other protein foods Skinless chicken or [place]. Ground chicken or [place]. Pork with fat trimmed off. Fish and seafood. Egg whites. Dried beans, peas, or lentils. Unsalted nuts, nut butters, and seeds. Unsalted canned beans. Lean cuts of beef with fat trimmed off. Low-sodium, lean deli meat. Dairy Low-fat (1%) or fat-free (skim) milk. Fat-free, low-fat, or reduced-fat cheeses. Nonfat, low-sodium ricotta or cottage cheese. Low-fat or nonfat yogurt. Low-fat, low-sodium cheese. Fats and oils Soft margarine without trans fats. Vegetable oil. Low-fat, reduced-fat, or light mayonnaise and salad dressings (reduced-sodium). Canola, safflower, olive, soybean, and sunflower oils. Avocado. Seasoning and other foods Herbs. Spices. Seasoning mixes without salt. Unsalted popcorn and pretzels. Fat-free sweets. What foods are not recommended? The items listed may not be a complete list. Talk with your dietitian about what dietary choices are best for you. Grains Baked goods made with fat, such as croissants, muffins, or some breads. Dry pasta or rice meal packs. Vegetables Creamed or fried vegetables. Vegetables in a cheese sauce. Regular canned vegetables (not low-sodium or reduced-sodium). Regular canned tomato sauce and paste (not low-sodium or reduced-sodium). Regular tomato and vegetable juice (not low-sodium or reduced-sodium). Angie Fava. Olives. Fruits Canned fruit in a light or heavy syrup. Fried fruit. Fruit in cream or butter sauce. Meat and other protein foods Fatty cuts of meat. Ribs. Fried meat. Berniece Salines. Sausage. Bologna and other processed lunch meats. Salami. Fatback. Hotdogs. Bratwurst. Salted nuts and seeds. Canned beans with added salt. Canned or smoked fish. Whole eggs or egg yolks. Chicken or [place] with skin. Dairy Whole or 2% milk, cream, and half-and-half. Whole or full-fat cream cheese. Whole-fat or sweetened yogurt. Full-fat cheese. Nondairy  creamers. Whipped toppings. Processed cheese and cheese spreads. Fats and oils Butter. Stick margarine. Lard. Shortening. Ghee. Bacon fat. Tropical oils, such as coconut, palm kernel, or palm oil. Seasoning and other foods Salted popcorn and pretzels. Onion salt, garlic salt, seasoned salt, table salt, and sea salt. Worcestershire sauce. Tartar sauce. Barbecue sauce. Teriyaki sauce. Soy sauce, including reduced-sodium. Steak sauce. Canned and packaged gravies. Fish sauce. Oyster sauce. Cocktail sauce. Horseradish that you find on the shelf. Ketchup. Mustard. Meat flavorings and tenderizers. Bouillon cubes. Hot sauce and Tabasco sauce. Premade or packaged marinades. Premade or packaged taco seasonings. Relishes. Regular salad dressings. Where to find more information:  National Heart, Lung, and [place]: https://wilson-eaton.com/  American Heart Association: www.heart.org Summary  The DASH eating plan is a healthy eating plan that has been shown to reduce high blood pressure (hypertension). It may also reduce your risk for type 2 diabetes, heart disease, and stroke.  With the DASH eating plan, you should limit salt (sodium) intake to 2,300 mg a day. If you have hypertension, you may need to reduce your sodium intake to 1,500 mg a day.  When on the DASH eating plan, aim to eat more fresh fruits and vegetables, whole grains, lean proteins, low-fat dairy, and heart-healthy fats.  Work with your health care provider or diet and nutrition specialist (dietitian) to  adjust your eating plan to your individual calorie needs. This information is not intended to replace advice given to you by your health care provider. Make sure you discuss any questions you have with your health care provider. Document Released: 11/29/2011 Document Revised: 12/03/2016 Document Reviewed: 12/03/2016 Elsevier Interactive Patient Education  2017 [employer].

## 2017-08-21 NOTE — Assessment & Plan Note (Addendum)
Multifactorial HH RN

## 2017-08-28 ENCOUNTER — Telehealth: Payer: Self-pay | Admitting: Internal Medicine

## 2017-08-28 NOTE — Telephone Encounter (Signed)
Pts daughter called wanting to let Dr Alain Marion know that the pt's blood pressure has been much better. Some recent readings were (132/69, 133/62 and 139/72).

## 2017-08-29 ENCOUNTER — Telehealth: Payer: Self-pay | Admitting: Internal Medicine

## 2017-08-29 NOTE — Telephone Encounter (Signed)
Started seeing patient last week. States that patient is not taking:  Brimonidine, klonopin, cyanocobalamin, voltaren, vascepa, naproxen sodium, simethicone and  Lidocaine

## 2017-08-29 NOTE — Telephone Encounter (Signed)
Noted. Thx.

## 2017-08-30 NOTE — Telephone Encounter (Signed)
FYI

## 2017-09-02 ENCOUNTER — Ambulatory Visit (HOSPITAL_COMMUNITY)
Admission: RE | Admit: 2017-09-02 | Discharge: 2017-09-02 | Disposition: A | Discharge status: Home / Self Care | Payer: Medicare Other | Source: Ambulatory Visit | Attending: Internal Medicine | Admitting: Internal Medicine

## 2017-09-02 DIAGNOSIS — E042 Nontoxic multinodular goiter: Secondary | ICD-10-CM | POA: Insufficient documentation

## 2017-09-02 DIAGNOSIS — N8189 Other female genital prolapse: Secondary | ICD-10-CM | POA: Insufficient documentation

## 2017-09-02 DIAGNOSIS — I700 Atherosclerosis of aorta: Secondary | ICD-10-CM | POA: Diagnosis not present

## 2017-09-02 DIAGNOSIS — K8020 Calculus of gallbladder without cholecystitis without obstruction: Secondary | ICD-10-CM | POA: Insufficient documentation

## 2017-09-02 DIAGNOSIS — R918 Other nonspecific abnormal finding of lung field: Secondary | ICD-10-CM | POA: Insufficient documentation

## 2017-09-02 LAB — GLUCOSE, CAPILLARY: Glucose-Capillary: 122 mg/dL — ABNORMAL HIGH (ref 65–99)

## 2017-09-02 MED ORDER — FLUDEOXYGLUCOSE F - 18 (FDG) INJECTION
7.2500 | Freq: Once | INTRAVENOUS | Status: AC | PRN
  Administered 2017-09-02: 7.25 via INTRAVENOUS

## 2017-09-10 DIAGNOSIS — M1991 Primary osteoarthritis, unspecified site: Secondary | ICD-10-CM

## 2017-09-10 DIAGNOSIS — Z7982 Long term (current) use of aspirin: Secondary | ICD-10-CM

## 2017-09-10 DIAGNOSIS — Z7984 Long term (current) use of oral hypoglycemic drugs: Secondary | ICD-10-CM

## 2017-09-10 DIAGNOSIS — H538 Other visual disturbances: Secondary | ICD-10-CM

## 2017-09-10 DIAGNOSIS — M542 Cervicalgia: Secondary | ICD-10-CM | POA: Diagnosis not present

## 2017-09-10 DIAGNOSIS — E1129 Type 2 diabetes mellitus with other diabetic kidney complication: Secondary | ICD-10-CM | POA: Diagnosis not present

## 2017-09-10 DIAGNOSIS — M545 Low back pain: Secondary | ICD-10-CM | POA: Diagnosis not present

## 2017-09-10 DIAGNOSIS — K5730 Diverticulosis of large intestine without perforation or abscess without bleeding: Secondary | ICD-10-CM | POA: Diagnosis not present

## 2017-09-10 DIAGNOSIS — M109 Gout, unspecified: Secondary | ICD-10-CM | POA: Diagnosis not present

## 2017-09-10 DIAGNOSIS — E1140 Type 2 diabetes mellitus with diabetic neuropathy, unspecified: Secondary | ICD-10-CM | POA: Diagnosis not present

## 2017-09-10 DIAGNOSIS — R627 Adult failure to thrive: Secondary | ICD-10-CM

## 2017-09-10 DIAGNOSIS — I10 Essential (primary) hypertension: Secondary | ICD-10-CM | POA: Diagnosis not present

## 2017-09-10 DIAGNOSIS — R911 Solitary pulmonary nodule: Secondary | ICD-10-CM

## 2017-09-10 DIAGNOSIS — M810 Age-related osteoporosis without current pathological fracture: Secondary | ICD-10-CM

## 2017-09-10 DIAGNOSIS — M25511 Pain in right shoulder: Secondary | ICD-10-CM

## 2017-09-10 DIAGNOSIS — Z9181 History of falling: Secondary | ICD-10-CM

## 2017-09-10 DIAGNOSIS — M25531 Pain in right wrist: Secondary | ICD-10-CM

## 2017-09-10 DIAGNOSIS — N10 Acute tubulo-interstitial nephritis: Secondary | ICD-10-CM | POA: Diagnosis not present

## 2017-09-10 DIAGNOSIS — R1310 Dysphagia, unspecified: Secondary | ICD-10-CM

## 2017-09-10 DIAGNOSIS — Z8673 Personal history of transient ischemic attack (TIA), and cerebral infarction without residual deficits: Secondary | ICD-10-CM

## 2017-09-11 ENCOUNTER — Ambulatory Visit: Payer: Medicare Other | Admitting: Internal Medicine

## 2017-10-02 ENCOUNTER — Other Ambulatory Visit: Payer: Self-pay | Admitting: Internal Medicine

## 2017-10-11 ENCOUNTER — Other Ambulatory Visit: Payer: Self-pay | Admitting: Internal Medicine

## 2017-10-11 DIAGNOSIS — Z1231 Encounter for screening mammogram for malignant neoplasm of breast: Secondary | ICD-10-CM

## 2017-10-25 ENCOUNTER — Ambulatory Visit: Payer: Medicare Other | Admitting: Internal Medicine

## 2017-10-30 ENCOUNTER — Encounter: Payer: Self-pay | Admitting: Internal Medicine

## 2017-10-30 ENCOUNTER — Ambulatory Visit: Payer: Medicare Other | Admitting: Internal Medicine

## 2017-10-30 ENCOUNTER — Other Ambulatory Visit: Payer: Medicare Other

## 2017-10-30 DIAGNOSIS — N649 Disorder of breast, unspecified: Secondary | ICD-10-CM | POA: Diagnosis not present

## 2017-10-30 DIAGNOSIS — M1000 Idiopathic gout, unspecified site: Secondary | ICD-10-CM | POA: Diagnosis not present

## 2017-10-30 DIAGNOSIS — I10 Essential (primary) hypertension: Secondary | ICD-10-CM

## 2017-10-30 DIAGNOSIS — N6459 Other signs and symptoms in breast: Secondary | ICD-10-CM

## 2017-10-30 DIAGNOSIS — R911 Solitary pulmonary nodule: Secondary | ICD-10-CM

## 2017-10-30 DIAGNOSIS — E1121 Type 2 diabetes mellitus with diabetic nephropathy: Secondary | ICD-10-CM | POA: Diagnosis not present

## 2017-10-30 NOTE — Assessment & Plan Note (Signed)
On Metformin 

## 2017-10-30 NOTE — Assessment & Plan Note (Addendum)
Nl exam PET scan 9/18 - ok. OK to get a mammogram next year

## 2017-10-30 NOTE — Assessment & Plan Note (Signed)
BP Readings from Last 3 Encounters:  10/30/17 (!) 142/80  08/21/17 (!) 180/90  08/13/17 (!) 204/90  Coreg and Amlodipine

## 2017-10-30 NOTE — Progress Notes (Signed)
Subjective:  Patient ID: Breanna [name], female    DOB: 1931/03/24  Age: 83 y.o. MRN: [id number]  CC: No chief complaint on file.   HPI Breanna [name] presents for HTN C/o L breast shrinkage....  Outpatient Medications Prior to Visit  Medication Sig Dispense Refill  . allopurinol (ZYLOPRIM) 300 MG tablet TAKE 1 TABLET EVERY DAY 90 tablet 3  . amLODipine (NORVASC) 5 MG tablet TAKE 1 TABLET BY MOUTH EVERY DAY 90 tablet 3  . aspirin 81 MG EC tablet Take 81 mg by mouth daily.      . brimonidine (ALPHAGAN) 0.15 % ophthalmic solution 1 drop.    Marland Kitchen CALCIUM PO Take by mouth daily.    . carvedilol (COREG) 25 MG tablet TAKE 1 TABLET BY MOUTH TWICE A DAY WITH A MEAL 180 tablet 3  . Cholecalciferol (VITAMIN D3) 2000 UNITS capsule Take 2,000 Units by mouth daily.      . cyanocobalamin 2000 MCG tablet Take 2,000 mcg by mouth daily.    . diclofenac sodium (VOLTAREN) 1 % GEL Apply 2 g topically 2 (two) times daily. 200 g 3  . diphenhydramine-acetaminophen (TYLENOL PM) 25-500 MG TABS 1 tablet at bedtime as needed.    . dorzolamide-timolol (COSOPT) 22.3-6.8 MG/ML ophthalmic solution 1 drop.    Marland Kitchen gabapentin (NEURONTIN) 100 MG capsule TAKE ONE CAPSULE BY MOUTH 3 TIMES A DAY 270 capsule 1  . latanoprost (XALATAN) 0.005 % ophthalmic solution Place 1 drop into both eyes 2 (two) times daily.    Marland Kitchen losartan (COZAAR) 50 MG tablet TAKE 1 TABLET (50 MG TOTAL) BY MOUTH DAILY. 90 tablet 3  . metFORMIN (GLUCOPHAGE) 500 MG tablet TAKE 1 TABLET (500 MG TOTAL) BY MOUTH DAILY WITH BREAKFAST. 90 tablet 3  . pantoprazole (PROTONIX) 40 MG tablet TAKE 1 TABLET BY MOUTH DAILY 90 tablet 1  . sertraline (ZOLOFT) 100 MG tablet TAKE 1 TABLET BY MOUTH EVERY DAY 90 tablet 1  . timolol (TIMOPTIC) 0.25 % ophthalmic solution     . torsemide (DEMADEX) 20 MG tablet TAKE 1/2 TABLET (10MG ) BY MOUTH DAILY AS NEEDED FOR EDEMA (SWELLING) 90 tablet 3   No facility-administered medications prior to visit.     ROS Review of Systems    Constitutional: Positive for fatigue. Negative for activity change, appetite change, chills and unexpected weight change.  HENT: Negative for congestion, mouth sores and sinus pressure.   Eyes: Negative for visual disturbance.  Respiratory: Negative for cough and chest tightness.   Gastrointestinal: Negative for abdominal pain and nausea.  Genitourinary: Negative for difficulty urinating, frequency and vaginal pain.  Musculoskeletal: Positive for arthralgias, back pain and gait problem.  Skin: Negative for pallor and rash.  Neurological: Negative for dizziness, tremors, weakness, numbness and headaches.  Psychiatric/Behavioral: Negative for confusion and sleep disturbance. The patient is nervous/anxious.     Objective:  BP (!) 142/80 (BP Location: Left Arm, Patient Position: Sitting, Cuff Size: Normal)   Pulse (!) 46   Temp 97.9 F (36.6 C) (Oral)   Ht 5\' 2"  (1.575 m)   Wt 149 lb (67.6 kg)   SpO2 98%   BMI 27.25 kg/m   BP Readings from Last 3 Encounters:  10/30/17 (!) 142/80  08/21/17 (!) 180/90  08/13/17 (!) 204/90    Wt Readings from Last 3 Encounters:  10/30/17 149 lb (67.6 kg)  08/21/17 146 lb (66.2 kg)  08/13/17 150 lb (68 kg)    Physical Exam  Constitutional: She appears well-developed. No distress.  HENT:  Head: Normocephalic.  Right Ear: External ear normal.  Left Ear: External ear normal.  Nose: Nose normal.  Mouth/Throat: Oropharynx is clear and moist.  Eyes: Conjunctivae are normal. Pupils are equal, round, and reactive to light. Right eye exhibits no discharge. Left eye exhibits no discharge.  Neck: Normal range of motion. Neck supple. No JVD present. No tracheal deviation present. No thyromegaly present.  Cardiovascular: Normal rate, regular rhythm and normal heart sounds.  Pulmonary/Chest: No stridor. No respiratory distress. She has no wheezes.  Abdominal: Soft. Bowel sounds are normal. She exhibits no distension and no mass. There is no tenderness.  There is no rebound and no guarding.  Musculoskeletal: She exhibits no edema or tenderness.  Lymphadenopathy:    She has no cervical adenopathy.  Neurological: She displays normal reflexes. No cranial nerve deficit. She exhibits normal muscle tone. Coordination normal.  Skin: No rash noted. No erythema.  Psychiatric: She has a normal mood and affect. Her behavior is normal. Judgment and thought content normal.  B breast exam is WNL; B axillae - nl Cane LS tender  Lab Results  Component Value Date   WBC 23.3 Repeated and verified X2. (HH) 08/13/2017   HGB 10.0 (L) 08/13/2017   HCT 31.3 (L) 08/13/2017   PLT 264.0 08/13/2017   GLUCOSE 135 (H) 08/13/2017   CHOL 154 10/19/2014   TRIG 56.0 10/19/2014   HDL 61.10 10/19/2014   LDLCALC 82 10/19/2014   ALT 9 08/13/2017   AST 15 08/13/2017   NA 136 08/13/2017   K 3.6 08/13/2017   CL 100 08/13/2017   CREATININE 1.22 (H) 08/13/2017   BUN 25 (H) 08/13/2017   CO2 27 08/13/2017   TSH 0.54 10/13/2015   HGBA1C 6.8 (H) 08/13/2017    Nm Pet Image Initial (pi) Skull Base To Thigh  Result Date: 09/02/2017 CLINICAL DATA:  Initial treatment strategy for superior segment left lower lobe lung nodule. EXAM: NUCLEAR MEDICINE PET SKULL BASE TO THIGH TECHNIQUE: 7.3 mCi F-18 FDG was injected intravenously. Full-ring PET imaging was performed from the skull base to thigh after the radiotracer. CT data was obtained and used for attenuation correction and anatomic localization. FASTING BLOOD GLUCOSE:  Value: 122 mg/dl COMPARISON:  08/15/2017 FINDINGS: NECK No hypermetabolic lymph nodes in the neck. Thyroid nodules without hypermetabolic activity. CHEST 0.9 by 0.8 cm nodule in the superior segment left lower lobe has maximum SUV 1.7. Old granulomatous disease. No other pulmonary nodules identified. No hypermetabolic adenopathy. Aortic and coronary atherosclerosis noted. ABDOMEN/PELVIS No abnormal hypermetabolic activity within the liver, pancreas, adrenal glands,  or spleen. No hypermetabolic lymph nodes in the abdomen or pelvis. Cholelithiasis. 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 Scattered physiologic activity in bowel. Aortoiliac atherosclerotic vascular disease. Posterolateral rod and pedicle screw fixation hardware in the lumbar spine. Anastomotic site in the sigmoid colon unremarkable. There is a suggestion of pelvic floor laxity. SKELETON No focal hypermetabolic activity to suggest skeletal metastasis. IMPRESSION: 1. The slow growing superior segment left lower lobe nodule has a maximum SUV of 1.7. This nodule grew only extremely slowly over the last 7 years and while it could still represent a low-grade adenocarcinoma, the benefits of resection versus surveillance must be considered in the context of the patient's chronologic age. Annual noncontrast surveillance CT scans of the chest would not be unreasonable. 2. Other imaging findings of potential clinical significance: Aortic Atherosclerosis (ICD10-I70.0). Cholelithiasis. Pelvic floor laxity. Thyroid nodules without hypermetabolic activity. Electronically Signed   By: Van Clines M.D.   On:  09/02/2017 10:40    Assessment & Plan:   There are no diagnoses linked to this encounter. I am having Alyssamarie B. Test maintain her aspirin, Vitamin D3, cyanocobalamin, brimonidine, diphenhydramine-acetaminophen, dorzolamide-timolol, timolol, CALCIUM PO, latanoprost, carvedilol, amLODipine, torsemide, metFORMIN, losartan, gabapentin, pantoprazole, sertraline, diclofenac sodium, and allopurinol.  No orders of the defined types were placed in this encounter.    Follow-up: No Follow-up on file.  Walker Kehr, MD

## 2017-10-30 NOTE — Assessment & Plan Note (Signed)
PET scan was ok

## 2017-10-30 NOTE — Assessment & Plan Note (Signed)
No relapse 

## 2017-11-25 ENCOUNTER — Ambulatory Visit: Payer: Medicare Other

## 2017-11-27 ENCOUNTER — Other Ambulatory Visit: Payer: Self-pay | Admitting: Internal Medicine

## 2017-11-28 ENCOUNTER — Other Ambulatory Visit: Payer: Self-pay | Admitting: General Practice

## 2017-11-28 MED ORDER — PANTOPRAZOLE SODIUM 40 MG PO TBEC: 40.0000 mg | DELAYED_RELEASE_TABLET | Freq: Every day | ORAL | 1 refills | Status: DC

## 2017-11-28 MED ORDER — GABAPENTIN 100 MG PO CAPS: 100.0000 mg | ORAL_CAPSULE | Freq: Three times a day (TID) | ORAL | 1 refills | Status: DC

## 2017-12-02 ENCOUNTER — Ambulatory Visit: Payer: Medicare Other

## 2017-12-12 ENCOUNTER — Other Ambulatory Visit: Payer: Self-pay | Admitting: Internal Medicine

## 2017-12-29 ENCOUNTER — Other Ambulatory Visit: Payer: Self-pay | Admitting: Internal Medicine

## 2018-01-01 ENCOUNTER — Telehealth: Payer: Self-pay | Admitting: Internal Medicine

## 2018-01-01 NOTE — Telephone Encounter (Signed)
Copied from [place] [id number]. Topic: Quick Communication - See Telephone Encounter >> Jan 01, 2018  3:30 PM Cleaster Corin, [place] wrote: CRM for notification. See Telephone encounter for:   01/01/18. Pt. Wants to know if it would be ok if she can start massage therapy On back and hip. Pt can be reached at [phone]

## 2018-01-02 NOTE — Telephone Encounter (Signed)
Please advise 

## 2018-01-03 NOTE — Telephone Encounter (Signed)
Ok massage Thx

## 2018-01-07 NOTE — Telephone Encounter (Signed)
Notified pt w/MD response.../lmb 

## 2018-01-31 ENCOUNTER — Other Ambulatory Visit: Payer: Self-pay | Admitting: Internal Medicine

## 2018-01-31 NOTE — Telephone Encounter (Signed)
Routing to dr plotnikov, please advise, thanks 

## 2018-02-05 ENCOUNTER — Other Ambulatory Visit: Payer: Self-pay | Admitting: Internal Medicine

## 2018-03-03 ENCOUNTER — Ambulatory Visit: Payer: Medicare Other | Admitting: Internal Medicine

## 2018-03-04 ENCOUNTER — Other Ambulatory Visit: Payer: Self-pay

## 2018-03-04 MED ORDER — GABAPENTIN 100 MG PO CAPS: 100.0000 mg | ORAL_CAPSULE | Freq: Three times a day (TID) | ORAL | 1 refills | Status: DC

## 2018-03-12 ENCOUNTER — Other Ambulatory Visit: Payer: Self-pay | Admitting: Internal Medicine

## 2018-03-14 ENCOUNTER — Encounter: Payer: Self-pay | Admitting: Internal Medicine

## 2018-03-14 ENCOUNTER — Other Ambulatory Visit (INDEPENDENT_AMBULATORY_CARE_PROVIDER_SITE_OTHER): Payer: Medicare Other

## 2018-03-14 ENCOUNTER — Ambulatory Visit: Payer: Medicare Other | Admitting: Internal Medicine

## 2018-03-14 VITALS — BP 146/72 | HR 50 | Temp 97.9°F | Ht 62.0 in | Wt 153.0 lb

## 2018-03-14 DIAGNOSIS — Z23 Encounter for immunization: Secondary | ICD-10-CM | POA: Diagnosis not present

## 2018-03-14 DIAGNOSIS — G8929 Other chronic pain: Secondary | ICD-10-CM | POA: Diagnosis not present

## 2018-03-14 DIAGNOSIS — E1142 Type 2 diabetes mellitus with diabetic polyneuropathy: Secondary | ICD-10-CM | POA: Diagnosis not present

## 2018-03-14 DIAGNOSIS — M5440 Lumbago with sciatica, unspecified side: Secondary | ICD-10-CM

## 2018-03-14 DIAGNOSIS — E1121 Type 2 diabetes mellitus with diabetic nephropathy: Secondary | ICD-10-CM

## 2018-03-14 DIAGNOSIS — R5383 Other fatigue: Secondary | ICD-10-CM | POA: Diagnosis not present

## 2018-03-14 DIAGNOSIS — I10 Essential (primary) hypertension: Secondary | ICD-10-CM | POA: Diagnosis not present

## 2018-03-14 DIAGNOSIS — E1129 Type 2 diabetes mellitus with other diabetic kidney complication: Secondary | ICD-10-CM

## 2018-03-14 LAB — HEMOGLOBIN A1C: Hgb A1c MFr Bld: 6.5 % (ref 4.6–6.5)

## 2018-03-14 LAB — BASIC METABOLIC PANEL
BUN: 27 mg/dL — ABNORMAL HIGH (ref 6–23)
CO2: 27 mEq/L (ref 19–32)
Calcium: 9.2 mg/dL (ref 8.4–10.5)
Chloride: 106 mEq/L (ref 96–112)
Creatinine, Ser: 1.13 mg/dL (ref 0.40–1.20)
GFR: 58.92 mL/min — ABNORMAL LOW (ref >60.00)
Glucose, Bld: 93 mg/dL (ref 70–99)
Potassium: 4.4 mEq/L (ref 3.5–5.1)
Sodium: 141 mEq/L (ref 135–145)

## 2018-03-14 NOTE — Addendum Note (Signed)
Addended by: Karren Cobble on: 03/14/2018 08:33 AM   Modules accepted: Orders

## 2018-03-14 NOTE — Patient Instructions (Addendum)
You can try CBD oil  [facility] well w/[facility] in [place]

## 2018-03-14 NOTE — Assessment & Plan Note (Signed)
try CBD oil

## 2018-03-14 NOTE — Progress Notes (Signed)
Subjective:  Patient ID: Breanna [name], female    DOB: 1931/05/02  Age: 84 y.o. MRN: [id number]  CC: No chief complaint on file.   HPI Breanna [name] presents for OA/LBP, HTN, DM f/u  Outpatient Medications Prior to Visit  Medication Sig Dispense Refill  . allopurinol (ZYLOPRIM) 300 MG tablet TAKE 1 TABLET EVERY DAY 90 tablet 3  . amLODipine (NORVASC) 5 MG tablet TAKE 1 TABLET BY MOUTH EVERY DAY 90 tablet 3  . aspirin 81 MG EC tablet Take 81 mg by mouth daily.      . benzonatate (TESSALON) 200 MG capsule TAKE 1 CAPSULE BY MOUTH 2 TIMES DAILY AS NEEDED FOR COUGH. 60 capsule 1  . brimonidine (ALPHAGAN) 0.15 % ophthalmic solution 1 drop.    Marland Kitchen CALCIUM PO Take by mouth daily.    . carvedilol (COREG) 25 MG tablet TAKE 1 TABLET BY MOUTH TWICE A DAY WITH A MEAL 180 tablet 3  . Cholecalciferol (VITAMIN D3) 2000 UNITS capsule Take 2,000 Units by mouth daily.      . cyanocobalamin 2000 MCG tablet Take 2,000 mcg by mouth daily.    . diclofenac sodium (VOLTAREN) 1 % GEL Apply 2 g topically 2 (two) times daily. 200 g 3  . diphenhydramine-acetaminophen (TYLENOL PM) 25-500 MG TABS 1 tablet at bedtime as needed.    . dorzolamide-timolol (COSOPT) 22.3-6.8 MG/ML ophthalmic solution 1 drop.    Marland Kitchen gabapentin (NEURONTIN) 100 MG capsule Take 1 capsule (100 mg total) by mouth 3 (three) times daily. 270 capsule 1  . latanoprost (XALATAN) 0.005 % ophthalmic solution Place 1 drop into both eyes 2 (two) times daily.    Marland Kitchen losartan (COZAAR) 50 MG tablet TAKE 1 TABLET (50 MG TOTAL) BY MOUTH DAILY. 90 tablet 0  . metFORMIN (GLUCOPHAGE) 500 MG tablet Take 1 tablet (500 mg total) by mouth daily with breakfast. Keep schedule appt for future refill 90 tablet 0  . pantoprazole (PROTONIX) 40 MG tablet Take 1 tablet (40 mg total) by mouth daily. 90 tablet 1  . sertraline (ZOLOFT) 100 MG tablet TAKE 1 TABLET BY MOUTH EVERY DAY 90 tablet 1  . timolol (TIMOPTIC) 0.25 % ophthalmic solution     . torsemide (DEMADEX) 20 MG tablet TAKE  1/2 TABLET (10MG ) BY MOUTH DAILY AS NEEDED FOR EDEMA (SWELLING) 90 tablet 2   No facility-administered medications prior to visit.     ROS Review of Systems  Constitutional: Positive for fatigue. Negative for activity change, appetite change, chills and unexpected weight change.  HENT: Negative for congestion, mouth sores and sinus pressure.   Eyes: Negative for visual disturbance.  Respiratory: Negative for cough and chest tightness.   Gastrointestinal: Negative for abdominal pain and nausea.  Genitourinary: Negative for difficulty urinating, frequency and vaginal pain.  Musculoskeletal: Positive for arthralgias, back pain and gait problem.  Skin: Negative for pallor and rash.  Neurological: Negative for dizziness, tremors, weakness, numbness and headaches.  Psychiatric/Behavioral: Positive for sleep disturbance. Negative for confusion and suicidal ideas. The patient is nervous/anxious.     Objective:  BP (!) 146/72 (BP Location: Left Arm, Patient Position: Sitting, Cuff Size: Large)   Pulse (!) 50   Temp 97.9 F (36.6 C) (Oral)   Ht 5\' 2"  (1.575 m)   Wt 153 lb (69.4 kg)   SpO2 99%   BMI 27.98 kg/m   BP Readings from Last 3 Encounters:  03/14/18 (!) 146/72  10/30/17 (!) 142/80  08/21/17 (!) 180/90    Wt Readings  from Last 3 Encounters:  03/14/18 153 lb (69.4 kg)  10/30/17 149 lb (67.6 kg)  08/21/17 146 lb (66.2 kg)    Physical Exam  Constitutional: She appears well-developed. No distress.  HENT:  Head: Normocephalic.  Right Ear: External ear normal.  Left Ear: External ear normal.  Nose: Nose normal.  Mouth/Throat: Oropharynx is clear and moist.  Eyes: Pupils are equal, round, and reactive to light. Conjunctivae are normal. Right eye exhibits no discharge. Left eye exhibits no discharge.  Neck: Normal range of motion. Neck supple. No JVD present. No tracheal deviation present. No thyromegaly present.  Cardiovascular: Normal rate, regular rhythm and normal heart  sounds.  Pulmonary/Chest: No stridor. No respiratory distress. She has no wheezes.  Abdominal: Soft. Bowel sounds are normal. She exhibits no distension and no mass. There is no tenderness. There is no rebound and no guarding.  Musculoskeletal: She exhibits tenderness. She exhibits no edema.  Lymphadenopathy:    She has no cervical adenopathy.  Neurological: She displays normal reflexes. No cranial nerve deficit. She exhibits normal muscle tone. Coordination abnormal.  Skin: No rash noted. No erythema.  Psychiatric: She has a normal mood and affect. Her behavior is normal. Judgment and thought content normal.  cane LS tender  Lab Results  Component Value Date   WBC 23.3 Repeated and verified X2. (HH) 08/13/2017   HGB 10.0 (L) 08/13/2017   HCT 31.3 (L) 08/13/2017   PLT 264.0 08/13/2017   GLUCOSE 135 (H) 08/13/2017   CHOL 154 10/19/2014   TRIG 56.0 10/19/2014   HDL 61.10 10/19/2014   LDLCALC 82 10/19/2014   ALT 9 08/13/2017   AST 15 08/13/2017   NA 136 08/13/2017   K 3.6 08/13/2017   CL 100 08/13/2017   CREATININE 1.22 (H) 08/13/2017   BUN 25 (H) 08/13/2017   CO2 27 08/13/2017   TSH 0.54 10/13/2015   HGBA1C 6.8 (H) 08/13/2017    Nm Pet Image Initial (pi) Skull Base To Thigh  Result Date: 09/02/2017 CLINICAL DATA:  Initial treatment strategy for superior segment left lower lobe lung nodule. EXAM: NUCLEAR MEDICINE PET SKULL BASE TO THIGH TECHNIQUE: 7.3 mCi F-18 FDG was injected intravenously. Full-ring PET imaging was performed from the skull base to thigh after the radiotracer. CT data was obtained and used for attenuation correction and anatomic localization. FASTING BLOOD GLUCOSE:  Value: 122 mg/dl COMPARISON:  08/15/2017 FINDINGS: NECK No hypermetabolic lymph nodes in the neck. Thyroid nodules without hypermetabolic activity. CHEST 0.9 by 0.8 cm nodule in the superior segment left lower lobe has maximum SUV 1.7. Old granulomatous disease. No other pulmonary nodules identified. No  hypermetabolic adenopathy. Aortic and coronary atherosclerosis noted. ABDOMEN/PELVIS No abnormal hypermetabolic activity within the liver, pancreas, adrenal glands, or spleen. No hypermetabolic lymph nodes in the abdomen or pelvis. Cholelithiasis. 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 Scattered physiologic activity in bowel. Aortoiliac atherosclerotic vascular disease. Posterolateral rod and pedicle screw fixation hardware in the lumbar spine. Anastomotic site in the sigmoid colon unremarkable. There is a suggestion of pelvic floor laxity. SKELETON No focal hypermetabolic activity to suggest skeletal metastasis. IMPRESSION: 1. The slow growing superior segment left lower lobe nodule has a maximum SUV of 1.7. This nodule grew only extremely slowly over the last 7 years and while it could still represent a low-grade adenocarcinoma, the benefits of resection versus surveillance must be considered in the context of the patient's chronologic age. Annual noncontrast surveillance CT scans of the chest would not be unreasonable. 2. Other imaging  findings of potential clinical significance: Aortic Atherosclerosis (ICD10-I70.0). Cholelithiasis. Pelvic floor laxity. Thyroid nodules without hypermetabolic activity. Electronically Signed   By: Van Clines M.D.   On: 09/02/2017 10:40    Assessment & Plan:   There are no diagnoses linked to this encounter. I am having Breanna [name] maintain her aspirin, Vitamin D3, cyanocobalamin, brimonidine, diphenhydramine-acetaminophen, dorzolamide-timolol, timolol, CALCIUM PO, latanoprost, diclofenac sodium, allopurinol, pantoprazole, sertraline, carvedilol, amLODipine, benzonatate, torsemide, gabapentin, losartan, and metFORMIN.  No orders of the defined types were placed in this encounter.    Follow-up: No follow-ups on file.  Walker Kehr, MD

## 2018-03-14 NOTE — Assessment & Plan Note (Signed)
Coreg, Amlodipine 

## 2018-03-14 NOTE — Assessment & Plan Note (Signed)
You can try CBD oil

## 2018-03-14 NOTE — Assessment & Plan Note (Signed)
Labs

## 2018-03-14 NOTE — Assessment & Plan Note (Signed)
Metformin 

## 2018-04-03 ENCOUNTER — Ambulatory Visit: Payer: Self-pay | Admitting: *Deleted

## 2018-04-03 NOTE — Telephone Encounter (Signed)
Patient reports she has been waking wet with sweat. She is fatigued and has notice she has nocturia at night with loss of control at times.Patient reports her symptoms have been occurring for a week and she is not improving. Reason for Disposition . [1] NIGHT SWEATS occur (e.g., drenching sweat that occurs at night and has to change bed clothes or bed sheets) AND [2] cause unknown  Answer Assessment - Initial Assessment Questions 1. ONSET: "When did the sweating start?"      1 week 2. LOCATION: "What part of your body has excessive sweating?" (e.g., entire body; just face, underarms, palms, or soles of feet).      Head to waist 3. SEVERITY: "How bad is the sweating?"    (Scale 1-10; or mild, moderate, severe)   -  MILD (1-3): doesn't interfere with normal activities    -  MODERATE (4-7): interferes with normal activities (e.g., work or school) or awakens from sleep; causes embarrassment in social situations    -  SEVERE (8-10): drenching sweats and has to change bed clothes or bed linens.     severe 4. CAUSE: "What do you think is causing the sweating?"     unknown 5. FEVER: "Have you been having fevers?"     Not taking 6. OTHER SYMPTOMS: "Do you have any other symptoms?" (e.g., chest pain, difficulty breathing, lightheadedness, weight loss)     Heavy breathing yesterday, fatigue, nocturia  Protocols used: [facility]

## 2018-04-04 ENCOUNTER — Other Ambulatory Visit (INDEPENDENT_AMBULATORY_CARE_PROVIDER_SITE_OTHER): Payer: Medicare Other

## 2018-04-04 ENCOUNTER — Encounter: Payer: Self-pay | Admitting: Internal Medicine

## 2018-04-04 ENCOUNTER — Ambulatory Visit: Payer: Medicare Other | Admitting: Internal Medicine

## 2018-04-04 VITALS — BP 136/78 | HR 56 | Temp 98.7°F

## 2018-04-04 DIAGNOSIS — R61 Generalized hyperhidrosis: Secondary | ICD-10-CM | POA: Insufficient documentation

## 2018-04-04 DIAGNOSIS — E1129 Type 2 diabetes mellitus with other diabetic kidney complication: Secondary | ICD-10-CM

## 2018-04-04 DIAGNOSIS — W19XXXD Unspecified fall, subsequent encounter: Secondary | ICD-10-CM

## 2018-04-04 DIAGNOSIS — R5383 Other fatigue: Secondary | ICD-10-CM

## 2018-04-04 DIAGNOSIS — Y92009 Unspecified place in unspecified non-institutional (private) residence as the place of occurrence of the external cause: Secondary | ICD-10-CM | POA: Diagnosis not present

## 2018-04-04 LAB — CBC WITH DIFFERENTIAL/PLATELET
Basophils Absolute: 0 10*3/uL (ref 0.0–0.1)
Basophils Relative: 0.3 % (ref 0.0–3.0)
Eosinophils Absolute: 0.1 10*3/uL (ref 0.0–0.7)
Eosinophils Relative: 0.6 % (ref 0.0–5.0)
HCT: 30.5 % — ABNORMAL LOW (ref 36.0–46.0)
Hemoglobin: 9.6 g/dL — ABNORMAL LOW (ref 12.0–15.0)
Lymphocytes Relative: 9.3 % — ABNORMAL LOW (ref 12.0–46.0)
Lymphs Abs: 1.2 10*3/uL (ref 0.7–4.0)
MCHC: 31.4 g/dL (ref 30.0–36.0)
MCV: 87.4 fl (ref 78.0–100.0)
Monocytes Absolute: 0.8 10*3/uL (ref 0.1–1.0)
Monocytes Relative: 6.2 % (ref 3.0–12.0)
Neutro Abs: 11 10*3/uL — ABNORMAL HIGH (ref 1.4–7.7)
Neutrophils Relative %: 83.6 % — ABNORMAL HIGH (ref 43.0–77.0)
Platelets: 197 10*3/uL (ref 150.0–400.0)
RBC: 3.49 Mil/uL — ABNORMAL LOW (ref 3.87–5.11)
RDW: 17.3 % — ABNORMAL HIGH (ref 11.5–15.5)
WBC: 13.1 10*3/uL — ABNORMAL HIGH (ref 4.0–10.5)

## 2018-04-04 LAB — URINALYSIS, ROUTINE W REFLEX MICROSCOPIC
Bilirubin Urine: NEGATIVE
Ketones, ur: NEGATIVE
Nitrite: NEGATIVE
Specific Gravity, Urine: 1.01 (ref 1.000–1.030)
Total Protein, Urine: 100 — AB
Urine Glucose: NEGATIVE
Urobilinogen, UA: 0.2 (ref 0.0–1.0)
pH: 6.5 (ref 5.0–8.0)

## 2018-04-04 LAB — BASIC METABOLIC PANEL
BUN: 19 mg/dL (ref 6–23)
CO2: 27 mEq/L (ref 19–32)
Calcium: 9 mg/dL (ref 8.4–10.5)
Chloride: 106 mEq/L (ref 96–112)
Creatinine, Ser: 1.02 mg/dL (ref 0.40–1.20)
GFR: 66.3 mL/min (ref >60.00)
Glucose, Bld: 109 mg/dL — ABNORMAL HIGH (ref 70–99)
Potassium: 3.8 mEq/L (ref 3.5–5.1)
Sodium: 143 mEq/L (ref 135–145)

## 2018-04-04 LAB — TSH: TSH: 1.16 u[IU]/mL (ref 0.35–4.50)

## 2018-04-04 LAB — SEDIMENTATION RATE: Sed Rate: 130 mm/hr — ABNORMAL HIGH (ref 0–30)

## 2018-04-04 LAB — HEMOGLOBIN A1C: Hgb A1c MFr Bld: 6.4 % (ref 4.6–6.5)

## 2018-04-04 NOTE — Assessment & Plan Note (Addendum)
4/19 ?etiology PET scan ok 9/18 Labs Hold metformin for 1 week May need a TB test

## 2018-04-04 NOTE — Patient Instructions (Signed)
Hold metformin for 1 week

## 2018-04-04 NOTE — Progress Notes (Signed)
Subjective:  Patient ID: Breanna [name], female    DOB: [birth year]  Age: 84 y.o. MRN: [id number]  CC: No chief complaint on file.   HPI Breanna [name] presents for night sweats starting 2:30 am and a couple more times x months - worse lately: has to change her gown. C/o weakness spell 2 d ago. No LOC. More weak this week. Pt lost wt No chills. C/o some cough, heavy breathing.. Using CBD oil w/help Recent PET scan was [place]  Outpatient Medications Prior to Visit  Medication Sig Dispense Refill  . allopurinol (ZYLOPRIM) 300 MG tablet TAKE 1 TABLET EVERY DAY 90 tablet 3  . amLODipine (NORVASC) 5 MG tablet TAKE 1 TABLET BY MOUTH EVERY DAY 90 tablet 3  . aspirin 81 MG EC tablet Take 81 mg by mouth daily.      . benzonatate (TESSALON) 200 MG capsule TAKE 1 CAPSULE BY MOUTH 2 TIMES DAILY AS NEEDED FOR COUGH. 60 capsule 1  . brimonidine (ALPHAGAN) 0.15 % ophthalmic solution 1 drop.    Marland Kitchen CALCIUM PO Take by mouth daily.    . carvedilol (COREG) 25 MG tablet TAKE 1 TABLET BY MOUTH TWICE A DAY WITH A MEAL 180 tablet 3  . Cholecalciferol (VITAMIN D3) 2000 UNITS capsule Take 2,000 Units by mouth daily.      . cyanocobalamin 2000 MCG tablet Take 2,000 mcg by mouth daily.    . diclofenac sodium (VOLTAREN) 1 % GEL Apply 2 g topically 2 (two) times daily. 200 g 3  . diphenhydramine-acetaminophen (TYLENOL PM) 25-500 MG TABS 1 tablet at bedtime as needed.    . dorzolamide-timolol (COSOPT) 22.3-6.8 MG/ML ophthalmic solution 1 drop.    Marland Kitchen gabapentin (NEURONTIN) 100 MG capsule Take 1 capsule (100 mg total) by mouth 3 (three) times daily. 270 capsule 1  . latanoprost (XALATAN) 0.005 % ophthalmic solution Place 1 drop into both eyes 2 (two) times daily.    Marland Kitchen losartan (COZAAR) 50 MG tablet TAKE 1 TABLET (50 MG TOTAL) BY MOUTH DAILY. 90 tablet 0  . metFORMIN (GLUCOPHAGE) 500 MG tablet Take 1 tablet (500 mg total) by mouth daily with breakfast. Keep schedule appt for future refill 90 tablet 0  . NON FORMULARY CBD oil    .  pantoprazole (PROTONIX) 40 MG tablet Take 1 tablet (40 mg total) by mouth daily. 90 tablet 1  . sertraline (ZOLOFT) 100 MG tablet TAKE 1 TABLET BY MOUTH EVERY DAY 90 tablet 1  . timolol (TIMOPTIC) 0.25 % ophthalmic solution     . torsemide (DEMADEX) 20 MG tablet TAKE 1/2 TABLET (10MG ) BY MOUTH DAILY AS NEEDED FOR EDEMA (SWELLING) 90 tablet 2   No facility-administered medications prior to visit.     ROS Review of Systems  Constitutional: Positive for fatigue. Negative for activity change, appetite change, chills and unexpected weight change.  HENT: Negative for congestion, mouth sores and sinus pressure.   Eyes: Negative for visual disturbance.  Respiratory: Positive for cough and shortness of breath. Negative for chest tightness and wheezing.   Cardiovascular: Negative for leg swelling.  Gastrointestinal: Negative for abdominal pain and nausea.  Genitourinary: Negative for difficulty urinating, frequency and vaginal pain.  Musculoskeletal: Positive for arthralgias, back pain and gait problem.  Skin: Negative for pallor and rash.  Neurological: Positive for weakness. Negative for dizziness, tremors, numbness and headaches.  Psychiatric/Behavioral: Negative for confusion and sleep disturbance.  chills  Objective:  BP 136/78 (BP Location: Left Arm, Patient Position: Sitting, Cuff Size: Large)  Pulse (!) 56   Temp 98.7 F (37.1 C) (Oral)   SpO2 99%   BP Readings from Last 3 Encounters:  04/04/18 136/78  03/14/18 (!) 146/72  10/30/17 (!) 142/80    Wt Readings from Last 3 Encounters:  03/14/18 153 lb (69.4 kg)  10/30/17 149 lb (67.6 kg)  08/21/17 146 lb (66.2 kg)    Physical Exam  Constitutional: She appears well-developed. No distress.  HENT:  Head: Normocephalic.  Right Ear: External ear normal.  Left Ear: External ear normal.  Nose: Nose normal.  Mouth/Throat: Oropharynx is clear and moist.  Eyes: Pupils are equal, round, and reactive to light. Conjunctivae are  normal. Right eye exhibits no discharge. Left eye exhibits no discharge.  Neck: Normal range of motion. Neck supple. No JVD present. No tracheal deviation present. No thyromegaly present.  Cardiovascular: Normal rate, regular rhythm and normal heart sounds.  Pulmonary/Chest: No stridor. No respiratory distress. She has no wheezes.  Abdominal: Soft. Bowel sounds are normal. She exhibits no distension and no mass. There is no tenderness. There is no rebound and no guarding.  Musculoskeletal: She exhibits no edema or tenderness.  Lymphadenopathy:    She has no cervical adenopathy.  Neurological: She displays normal reflexes. No cranial nerve deficit. She exhibits normal muscle tone. Coordination normal.  Skin: No rash noted. No erythema.  Psychiatric: Her behavior is normal. Judgment and thought content normal.  cane  Lab Results  Component Value Date   WBC 23.3 Repeated and verified X2. (HH) 08/13/2017   HGB 10.0 (L) 08/13/2017   HCT 31.3 (L) 08/13/2017   PLT 264.0 08/13/2017   GLUCOSE 93 03/14/2018   CHOL 154 10/19/2014   TRIG 56.0 10/19/2014   HDL 61.10 10/19/2014   LDLCALC 82 10/19/2014   ALT 9 08/13/2017   AST 15 08/13/2017   NA 141 03/14/2018   K 4.4 03/14/2018   CL 106 03/14/2018   CREATININE 1.13 03/14/2018   BUN 27 (H) 03/14/2018   CO2 27 03/14/2018   TSH 0.54 10/13/2015   HGBA1C 6.5 03/14/2018    Nm Pet Image Initial (pi) Skull Base To Thigh  Result Date: 09/02/2017 CLINICAL DATA:  Initial treatment strategy for superior segment left lower lobe lung nodule. EXAM: NUCLEAR MEDICINE PET SKULL BASE TO THIGH TECHNIQUE: 7.3 mCi F-18 FDG was injected intravenously. Full-ring PET imaging was performed from the skull base to thigh after the radiotracer. CT data was obtained and used for attenuation correction and anatomic localization. FASTING BLOOD GLUCOSE:  Value: 122 mg/dl COMPARISON:  08/15/2017 FINDINGS: NECK No hypermetabolic lymph nodes in the neck. Thyroid nodules without  hypermetabolic activity. CHEST 0.9 by 0.8 cm nodule in the superior segment left lower lobe has maximum SUV 1.7. Old granulomatous disease. No other pulmonary nodules identified. No hypermetabolic adenopathy. Aortic and coronary atherosclerosis noted. ABDOMEN/PELVIS No abnormal hypermetabolic activity within the liver, pancreas, adrenal glands, or spleen. No hypermetabolic lymph nodes in the abdomen or pelvis. Cholelithiasis. 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 Scattered physiologic activity in bowel. Aortoiliac atherosclerotic vascular disease. Posterolateral rod and pedicle screw fixation hardware in the lumbar spine. Anastomotic site in the sigmoid colon unremarkable. There is a suggestion of pelvic floor laxity. SKELETON No focal hypermetabolic activity to suggest skeletal metastasis. IMPRESSION: 1. The slow growing superior segment left lower lobe nodule has a maximum SUV of 1.7. This nodule grew only extremely slowly over the last 7 years and while it could still represent a low-grade adenocarcinoma, the benefits of resection versus surveillance  must be considered in the context of the patient's chronologic age. Annual noncontrast surveillance CT scans of the chest would not be unreasonable. 2. Other imaging findings of potential clinical significance: Aortic Atherosclerosis (ICD10-I70.0). Cholelithiasis. Pelvic floor laxity. Thyroid nodules without hypermetabolic activity. Electronically Signed   By: Van Clines M.D.   On: 09/02/2017 10:40    Assessment & Plan:   There are no diagnoses linked to this encounter. I am having Damisha B. Pankratz maintain her aspirin, Vitamin D3, cyanocobalamin, brimonidine, diphenhydramine-acetaminophen, dorzolamide-timolol, timolol, CALCIUM PO, latanoprost, diclofenac sodium, allopurinol, pantoprazole, sertraline, carvedilol, amLODipine, benzonatate, torsemide, gabapentin, losartan, metFORMIN, and NON FORMULARY.  No orders of the defined types were placed in  this encounter.    Follow-up: No follow-ups on file.  Walker Kehr, MD

## 2018-04-04 NOTE — Assessment & Plan Note (Signed)
No relapse 

## 2018-04-05 LAB — URINE CULTURE
MICRO NUMBER:: 90453460
SPECIMEN QUALITY:: ADEQUATE

## 2018-04-23 ENCOUNTER — Other Ambulatory Visit: Payer: Self-pay | Admitting: Internal Medicine

## 2018-04-23 DIAGNOSIS — R7989 Other specified abnormal findings of blood chemistry: Secondary | ICD-10-CM

## 2018-04-30 ENCOUNTER — Ambulatory Visit
Admission: RE | Admit: 2018-04-30 | Discharge: 2018-04-30 | Disposition: A | Discharge status: Home / Self Care | Payer: Medicare Other | Source: Ambulatory Visit | Attending: Internal Medicine | Admitting: Internal Medicine

## 2018-04-30 DIAGNOSIS — Z1231 Encounter for screening mammogram for malignant neoplasm of breast: Secondary | ICD-10-CM

## 2018-05-07 ENCOUNTER — Ambulatory Visit: Payer: Medicare Other | Admitting: Internal Medicine

## 2018-05-07 ENCOUNTER — Telehealth: Payer: Self-pay | Admitting: Hematology

## 2018-05-07 ENCOUNTER — Encounter: Payer: Self-pay | Admitting: Hematology

## 2018-05-07 NOTE — Telephone Encounter (Signed)
Pt has been scheduled to see Dr. Irene Limbo on 5/23 at 10am. Letter mailed to the pt and referring office contacted to notify the pt.

## 2018-05-12 ENCOUNTER — Telehealth: Payer: Self-pay | Admitting: Hematology

## 2018-05-12 NOTE — Telephone Encounter (Signed)
Pt cld and lft a vm wanting to know about her appt scheduled on 5/23 with Dr. Irene Limbo. Pt's phone line had a busy signal, unable to speak to the pt.

## 2018-05-13 ENCOUNTER — Encounter: Payer: Self-pay | Admitting: Internal Medicine

## 2018-05-13 ENCOUNTER — Ambulatory Visit (INDEPENDENT_AMBULATORY_CARE_PROVIDER_SITE_OTHER): Payer: Medicare Other | Admitting: Internal Medicine

## 2018-05-13 VITALS — BP 136/82 | HR 55 | Temp 97.6°F | Ht 62.0 in | Wt 145.0 lb

## 2018-05-13 DIAGNOSIS — Z0000 Encounter for general adult medical examination without abnormal findings: Secondary | ICD-10-CM

## 2018-05-13 DIAGNOSIS — G8929 Other chronic pain: Secondary | ICD-10-CM

## 2018-05-13 DIAGNOSIS — E1121 Type 2 diabetes mellitus with diabetic nephropathy: Secondary | ICD-10-CM

## 2018-05-13 DIAGNOSIS — E1142 Type 2 diabetes mellitus with diabetic polyneuropathy: Secondary | ICD-10-CM | POA: Diagnosis not present

## 2018-05-13 DIAGNOSIS — B001 Herpesviral vesicular dermatitis: Secondary | ICD-10-CM | POA: Insufficient documentation

## 2018-05-13 DIAGNOSIS — Z23 Encounter for immunization: Secondary | ICD-10-CM

## 2018-05-13 DIAGNOSIS — R634 Abnormal weight loss: Secondary | ICD-10-CM

## 2018-05-13 DIAGNOSIS — M5440 Lumbago with sciatica, unspecified side: Secondary | ICD-10-CM | POA: Diagnosis not present

## 2018-05-13 MED ORDER — VALACYCLOVIR HCL 500 MG PO TABS: 500.0000 mg | ORAL_TABLET | Freq: Two times a day (BID) | ORAL | 1 refills | Status: DC

## 2018-05-13 NOTE — Assessment & Plan Note (Signed)
Breanna [name] helps

## 2018-05-13 NOTE — Assessment & Plan Note (Signed)
2/year Valtrex prn

## 2018-05-13 NOTE — Assessment & Plan Note (Signed)
Metformin 

## 2018-05-13 NOTE — Patient Instructions (Signed)
Cold Sore A cold sore, also called a fever blister, is a skin infection that is caused by a virus. This infection causes small, fluid-filled sores to form inside of the mouth or on the lips, gums, nose, chin, or cheeks. Cold sores can spread to other parts of the body, such as the eyes or fingers. Cold sores can be spread or passed from person to person (contagious) until the sores crust over completely. Cold sores can be spread through close contact, such as kissing or sharing a drinking glass. Follow these instructions at home: Medicines  Take or apply over-the-counter and prescription medicines only as told by your doctor.  Use a cotton-tip swab to apply creams or gels to your sores. Sore Care  Do not touch the sores or pick the scabs.  Wash your hands often. Do not touch your eyes without washing your hands first.  Keep the sores clean and dry.  If directed, apply ice to the sores:  Put ice in a plastic bag.  Place a towel between your skin and the bag.  Leave the ice on for 20 minutes, 2-3 times per day. Lifestyle  Do not kiss, have oral sex, or share personal items until your sores heal.  Eat a soft, bland diet. Avoid eating hot, cold, or salty foods. These can hurt your mouth.  Use a straw if it hurts to drink out of a glass.  Avoid the sun and limit your stress if these things trigger outbreaks. If sun causes cold sores, apply sunscreen on your lips before being out in the sun. Contact a doctor if:  You have symptoms for more than two weeks.  You have pus coming from the sores.  You have redness that is spreading.  You have pain or irritation in your eye.  You get sores on your genitals.  Your sores do not heal within two weeks.  You get cold sores often. Get help right away if:  You have a fever and your symptoms suddenly get worse.  You have a headache and confusion. This information is not intended to replace advice given to you by your health care  provider. Make sure you discuss any questions you have with your health care provider. Document Released: 06/10/2012 Document Revised: 05/17/2016 Document Reviewed: 09/30/2015 Elsevier Interactive Patient Education  2018 [employer] Maintenance for Postmenopausal Women Menopause is a normal process in which your reproductive ability comes to an end. This process happens gradually over a span of months to years, usually between the ages of 47 and 20. Menopause is complete when you have missed 12 consecutive menstrual periods. It is important to talk with your health care provider about some of the most common conditions that affect postmenopausal women, such as heart disease, cancer, and bone loss (osteoporosis). Adopting a healthy lifestyle and getting preventive care can help to promote your health and wellness. Those actions can also lower your chances of developing some of these common conditions. What should I know about menopause? During menopause, you may experience a number of symptoms, such as:  Moderate-to-severe hot flashes.  Night sweats.  Decrease in sex drive.  Mood swings.  Headaches.  Tiredness.  Irritability.  Memory problems.  Insomnia.  Choosing to treat or not to treat menopausal changes is an individual decision that you make with your health care provider. What should I know about hormone replacement therapy and supplements? Hormone therapy products are effective for treating symptoms that are associated with menopause,  such as hot flashes and night sweats. Hormone replacement carries certain risks, especially as you become older. If you are thinking about using estrogen or estrogen with progestin treatments, discuss the benefits and risks with your health care provider. What should I know about heart disease and stroke? Heart disease, heart attack, and stroke become more likely as you age. This may be due, in part, to the hormonal changes that your  body experiences during menopause. These can affect how your body processes dietary fats, triglycerides, and cholesterol. Heart attack and stroke are both medical emergencies. There are many things that you can do to help prevent heart disease and stroke:  Have your blood pressure checked at least every 1-2 years. High blood pressure causes heart disease and increases the risk of stroke.  If you are 30-63 years old, ask your health care provider if you should take aspirin to prevent a heart attack or a stroke.  Do not use any tobacco products, including cigarettes, chewing tobacco, or electronic cigarettes. If you need help quitting, ask your health care provider.  It is important to eat a healthy diet and maintain a healthy weight. ? Be sure to include plenty of vegetables, fruits, low-fat dairy products, and lean protein. ? Avoid eating foods that are high in solid fats, added sugars, or salt (sodium).  Get regular exercise. This is one of the most important things that you can do for your health. ? Try to exercise for at least 150 minutes each week. The type of exercise that you do should increase your heart rate and make you sweat. This is known as moderate-intensity exercise. ? Try to do strengthening exercises at least twice each week. Do these in addition to the moderate-intensity exercise.  Know your numbers.Ask your health care provider to check your cholesterol and your blood glucose. Continue to have your blood tested as directed by your health care provider.  What should I know about cancer screening? There are several types of cancer. Take the following steps to reduce your risk and to catch any cancer development as early as possible. Breast Cancer  Practice breast self-awareness. ? This means understanding how your breasts normally appear and feel. ? It also means doing regular breast self-exams. Let your health care provider know about any changes, no matter how small.  If  you are 31 or older, have a clinician do a breast exam (clinical breast exam or CBE) every year. Depending on your age, family history, and medical history, it may be recommended that you also have a yearly breast X-ray (mammogram).  If you have a family history of breast cancer, talk with your health care provider about genetic screening.  If you are at high risk for breast cancer, talk with your health care provider about having an MRI and a mammogram every year.  Breast cancer (BRCA) gene test is recommended for women who have family members with BRCA-related cancers. Results of the assessment will determine the need for genetic counseling and BRCA1 and for BRCA2 testing. BRCA-related cancers include these types: ? Breast. This occurs in males or females. ? Ovarian. ? Tubal. This may also be called fallopian tube cancer. ? Cancer of the abdominal or pelvic lining (peritoneal cancer). ? Prostate. ? Pancreatic.  Cervical, Uterine, and Ovarian Cancer Your health care provider may recommend that you be screened regularly for cancer of the pelvic organs. These include your ovaries, uterus, and vagina. This screening involves a pelvic exam, which includes checking for microscopic  changes to the surface of your cervix (Pap test).  For women ages 21-65, health care providers may recommend a pelvic exam and a Pap test every three years. For women ages 22-65, they may recommend the Pap test and pelvic exam, combined with testing for human papilloma virus (HPV), every five years. Some types of HPV increase your risk of cervical cancer. Testing for HPV may also be done on women of any age who have unclear Pap test results.  Other health care providers may not recommend any screening for nonpregnant women who are considered low risk for pelvic cancer and have no symptoms. Ask your health care provider if a screening pelvic exam is right for you.  If you have had past treatment for cervical cancer or a  condition that could lead to cancer, you need Pap tests and screening for cancer for at least 20 years after your treatment. If Pap tests have been discontinued for you, your risk factors (such as having a new sexual partner) need to be reassessed to determine if you should start having screenings again. Some women have medical problems that increase the chance of getting cervical cancer. In these cases, your health care provider may recommend that you have screening and Pap tests more often.  If you have a family history of uterine cancer or ovarian cancer, talk with your health care provider about genetic screening.  If you have vaginal bleeding after reaching menopause, tell your health care provider.  There are currently no reliable tests available to screen for ovarian cancer.  Lung Cancer Lung cancer screening is recommended for adults 31-17 years old who are at high risk for lung cancer because of a history of smoking. A yearly low-dose CT scan of the lungs is recommended if you:  Currently smoke.  Have a history of at least 30 pack-years of smoking and you currently smoke or have quit within the past 15 years. A pack-year is smoking an average of one pack of cigarettes per day for one year.  Yearly screening should:  Continue until it has been 15 years since you quit.  Stop if you develop a health problem that would prevent you from having lung cancer treatment.  Colorectal Cancer  This type of cancer can be detected and can often be prevented.  Routine colorectal cancer screening usually begins at age 70 and continues through age 75.  If you have risk factors for colon cancer, your health care provider may recommend that you be screened at an earlier age.  If you have a family history of colorectal cancer, talk with your health care provider about genetic screening.  Your health care provider may also recommend using home test kits to check for hidden blood in your stool.  A  small camera at the end of a tube can be used to examine your colon directly (sigmoidoscopy or colonoscopy). This is done to check for the earliest forms of colorectal cancer.  Direct examination of the colon should be repeated every 5-10 years until age 33. However, if early forms of precancerous polyps or small growths are found or if you have a family history or genetic risk for colorectal cancer, you may need to be screened more often.  Skin Cancer  Check your skin from head to toe regularly.  Monitor any moles. Be sure to tell your health care provider: ? About any new moles or changes in moles, especially if there is a change in a mole's shape or color. ?  If you have a mole that is larger than the size of a pencil eraser.  If any of your family members has a history of skin cancer, especially at a young age, talk with your health care provider about genetic screening.  Always use sunscreen. Apply sunscreen liberally and repeatedly throughout the day.  Whenever you are outside, protect yourself by wearing long sleeves, pants, a wide-brimmed hat, and sunglasses.  What should I know about osteoporosis? Osteoporosis is a condition in which bone destruction happens more quickly than new bone creation. After menopause, you may be at an increased risk for osteoporosis. To help prevent osteoporosis or the bone fractures that can happen because of osteoporosis, the following is recommended:  If you are 32-26 years old, get at least 1,000 mg of calcium and at least 600 mg of vitamin D per day.  If you are older than age 11 but younger than age 56, get at least 1,200 mg of calcium and at least 600 mg of vitamin D per day.  If you are older than age 35, get at least 1,200 mg of calcium and at least 800 mg of vitamin D per day.  Smoking and excessive alcohol intake increase the risk of osteoporosis. Eat foods that are rich in calcium and vitamin D, and do weight-bearing exercises several times  each week as directed by your health care provider. What should I know about how menopause affects my mental health? Depression may occur at any age, but it is more common as you become older. Common symptoms of depression include:  Low or sad mood.  Changes in sleep patterns.  Changes in appetite or eating patterns.  Feeling an overall lack of motivation or enjoyment of activities that you previously enjoyed.  Frequent crying spells.  Talk with your health care provider if you think that you are experiencing depression. What should I know about immunizations? It is important that you get and maintain your immunizations. These include:  Tetanus, diphtheria, and pertussis (Tdap) booster vaccine.  Influenza every year before the flu season begins.  Pneumonia vaccine.  Shingles vaccine.  Your health care provider may also recommend other immunizations. This information is not intended to replace advice given to you by your health care provider. Make sure you discuss any questions you have with your health care provider. Document Released: 02/01/2006 Document Revised: 06/29/2016 Document Reviewed: 09/13/2015 Elsevier Interactive Patient Education  2018 [employer].

## 2018-05-13 NOTE — Progress Notes (Signed)
Subjective:  Patient ID: Breanna [name], female    DOB: Oct 14, 1931  Age: 84 y.o. MRN: [id number]  CC: No chief complaint on file.   HPI Breanna [name] presents for a well exam C/o fever blisters  C/o anemia  Outpatient Medications Prior to Visit  Medication Sig Dispense Refill  . allopurinol (ZYLOPRIM) 300 MG tablet TAKE 1 TABLET EVERY DAY 90 tablet 3  . amLODipine (NORVASC) 5 MG tablet TAKE 1 TABLET BY MOUTH EVERY DAY 90 tablet 3  . aspirin 81 MG EC tablet Take 81 mg by mouth daily.      . benzonatate (TESSALON) 200 MG capsule TAKE 1 CAPSULE BY MOUTH 2 TIMES DAILY AS NEEDED FOR COUGH. 60 capsule 1  . brimonidine (ALPHAGAN) 0.15 % ophthalmic solution 1 drop.    Marland Kitchen CALCIUM PO Take by mouth daily.    . carvedilol (COREG) 25 MG tablet TAKE 1 TABLET BY MOUTH TWICE A DAY WITH A MEAL 180 tablet 3  . Cholecalciferol (VITAMIN D3) 2000 UNITS capsule Take 2,000 Units by mouth daily.      . cyanocobalamin 2000 MCG tablet Take 2,000 mcg by mouth daily.    . diclofenac sodium (VOLTAREN) 1 % GEL Apply 2 g topically 2 (two) times daily. 200 g 3  . diphenhydramine-acetaminophen (TYLENOL PM) 25-500 MG TABS 1 tablet at bedtime as needed.    . dorzolamide-timolol (COSOPT) 22.3-6.8 MG/ML ophthalmic solution 1 drop.    Marland Kitchen gabapentin (NEURONTIN) 100 MG capsule Take 1 capsule (100 mg total) by mouth 3 (three) times daily. 270 capsule 1  . latanoprost (XALATAN) 0.005 % ophthalmic solution Place 1 drop into both eyes 2 (two) times daily.    Marland Kitchen losartan (COZAAR) 50 MG tablet TAKE 1 TABLET (50 MG TOTAL) BY MOUTH DAILY. 90 tablet 0  . metFORMIN (GLUCOPHAGE) 500 MG tablet Take 1 tablet (500 mg total) by mouth daily with breakfast. Keep schedule appt for future refill 90 tablet 0  . NON FORMULARY CBD oil    . pantoprazole (PROTONIX) 40 MG tablet Take 1 tablet (40 mg total) by mouth daily. 90 tablet 1  . sertraline (ZOLOFT) 100 MG tablet TAKE 1 TABLET BY MOUTH EVERY DAY 90 tablet 1  . timolol (TIMOPTIC) 0.25 % ophthalmic  solution     . torsemide (DEMADEX) 20 MG tablet TAKE 1/2 TABLET (10MG ) BY MOUTH DAILY AS NEEDED FOR EDEMA (SWELLING) 90 tablet 2   No facility-administered medications prior to visit.     ROS Review of Systems  Constitutional: Positive for unexpected weight change. Negative for activity change, appetite change, chills and fatigue.  HENT: Negative for congestion, mouth sores and sinus pressure.   Eyes: Negative for visual disturbance.  Respiratory: Negative for cough and chest tightness.   Gastrointestinal: Negative for abdominal pain and nausea.  Genitourinary: Negative for difficulty urinating, frequency and vaginal pain.  Musculoskeletal: Positive for arthralgias, back pain and gait problem.  Skin: Positive for rash. Negative for pallor.  Neurological: Negative for dizziness, tremors, weakness, numbness and headaches.  Psychiatric/Behavioral: Positive for decreased concentration. Negative for confusion, sleep disturbance and suicidal ideas. The patient is nervous/anxious.     Objective:  BP 136/82 (BP Location: Left Arm, Patient Position: Sitting, Cuff Size: Normal)   Pulse (!) 55   Temp 97.6 F (36.4 C) (Oral)   Ht 5\' 2"  (1.575 m)   Wt 145 lb (65.8 kg)   SpO2 97%   BMI 26.52 kg/m   BP Readings from Last 3 Encounters:  05/13/18  136/82  04/04/18 136/78  03/14/18 (!) 146/72    Wt Readings from Last 3 Encounters:  05/13/18 145 lb (65.8 kg)  03/14/18 153 lb (69.4 kg)  10/30/17 149 lb (67.6 kg)    Physical Exam  Constitutional: She appears well-developed. No distress.  HENT:  Head: Normocephalic.  Right Ear: External ear normal.  Left Ear: External ear normal.  Nose: Nose normal.  Mouth/Throat: Oropharynx is clear and moist.  Eyes: Pupils are equal, round, and reactive to light. Conjunctivae are normal. Right eye exhibits no discharge. Left eye exhibits no discharge.  Neck: Normal range of motion. Neck supple. No JVD present. No tracheal deviation present. No  thyromegaly present.  Cardiovascular: Normal rate, regular rhythm and normal heart sounds.  Pulmonary/Chest: No stridor. No respiratory distress. She has no wheezes.  Abdominal: Soft. Bowel sounds are normal. She exhibits no distension and no mass. There is no tenderness. There is no rebound and no guarding.  Musculoskeletal: She exhibits no edema or tenderness.  Lymphadenopathy:    She has no cervical adenopathy.  Neurological: She displays normal reflexes. No cranial nerve deficit. She exhibits normal muscle tone. Coordination abnormal.  Skin: Rash noted. No erythema.  Psychiatric: She has a normal mood and affect. Her behavior is normal. Judgment and thought content normal.   Rash on L upper lip Cane LS tender   Lab Results  Component Value Date   WBC 13.1 (H) 04/04/2018   HGB 9.6 (L) 04/04/2018   HCT 30.5 (L) 04/04/2018   PLT 197.0 04/04/2018   GLUCOSE 109 (H) 04/04/2018   CHOL 154 10/19/2014   TRIG 56.0 10/19/2014   HDL 61.10 10/19/2014   LDLCALC 82 10/19/2014   ALT 9 08/13/2017   AST 15 08/13/2017   NA 143 04/04/2018   K 3.8 04/04/2018   CL 106 04/04/2018   CREATININE 1.02 04/04/2018   BUN 19 04/04/2018   CO2 27 04/04/2018   TSH 1.16 04/04/2018   HGBA1C 6.4 04/04/2018    Mm Screening Breast Tomo Bilateral  Result Date: 04/30/2018 CLINICAL DATA:  Screening. EXAM: DIGITAL SCREENING BILATERAL MAMMOGRAM WITH TOMO AND CAD COMPARISON:  Previous exam(s). ACR Breast Density Category b: There are scattered areas of fibroglandular density. FINDINGS: There are no findings suspicious for malignancy. Images were processed with CAD. IMPRESSION: No mammographic evidence of malignancy. A result letter of this screening mammogram will be mailed directly to the patient. RECOMMENDATION: Screening mammogram in one year. (Code:SM-B-01Y) BI-RADS CATEGORY  1: Negative. Electronically Signed   By: Curlene Dolphin M.D.   On: 04/30/2018 16:17    Assessment & Plan:   There are no diagnoses  linked to this encounter. I am having Moria B. Miles maintain her aspirin, Vitamin D3, cyanocobalamin, brimonidine, diphenhydramine-acetaminophen, dorzolamide-timolol, timolol, CALCIUM PO, latanoprost, diclofenac sodium, allopurinol, pantoprazole, sertraline, carvedilol, amLODipine, benzonatate, torsemide, gabapentin, losartan, metFORMIN, and NON FORMULARY.  No orders of the defined types were placed in this encounter.    Follow-up: No follow-ups on file.  Walker Kehr, MD

## 2018-05-13 NOTE — Assessment & Plan Note (Signed)
Wt Readings from Last 3 Encounters:  05/13/18 145 lb (65.8 kg)  03/14/18 153 lb (69.4 kg)  10/30/17 149 lb (67.6 kg)

## 2018-05-13 NOTE — Addendum Note (Signed)
Addended by: Karren Cobble on: 05/13/2018 03:46 PM   Modules accepted: Orders

## 2018-05-13 NOTE — Assessment & Plan Note (Signed)
Here for medicare wellness/physical  Diet: heart healthy  Physical activity: not sedentary  Depression/mood screen: negative  Hearing: intact to whispered voice  Visual acuity: grossly normal w/glasses, performs annual eye exam  ADLs: capable  Fall risk: low - using a cane Home safety: good  Cognitive evaluation: intact to orientation, naming, recall and repetition  EOL planning: adv directives, full code/ I agree  I have personally reviewed and have noted  1. The patient's medical, surgical and social history  2. Their use of alcohol, tobacco or illicit drugs  3. Their current medications and supplements  4. The patient's functional ability including ADL's, fall risks, home safety risks and hearing or visual impairment.  5. Diet and physical activities  6. Evidence for depression or mood disorders 7. The roster of all physicians providing medical care to patient - is listed in the Snapshot section of the chart and reviewed today.    Today patient counseled on age appropriate routine health concerns for screening and prevention, each reviewed and up to date or declined. Immunizations reviewed and up to date or declined. Labs ordered and reviewed. Risk factors for depression reviewed and negative. Hearing function and visual acuity are intact. ADLs screened and addressed as needed. Functional ability and level of safety reviewed and appropriate. Education, counseling and referrals performed based on assessed risks today. Patient provided with a copy of personalized plan for preventive services.

## 2018-05-14 NOTE — Progress Notes (Signed)
HEMATOLOGY/ONCOLOGY CONSULTATION NOTE  Date of Service: 05/15/18  Patient Care Team: Cassandria Anger, MD as PCP - General (Internal Medicine) Carolan Clines, MD as Consulting Physician (Urology) Melina Schools, MD as Consulting Physician (Orthopedic Surgery)  CHIEF COMPLAINTS/PURPOSE OF CONSULTATION:  Neutrophilia  HISTORY OF PRESENTING ILLNESS:   Breanna [name] is a wonderful 84 y.o. female who has been referred to [place] by Dr Lew Dawes for evaluation and management of an abnormal CBC. She is accompanied today by her neighbor. The pt reports that she is doing well overall.   The pt reports that she hasn't been able to tell any changes in how she feels in the last 6 months. She has lost 50 pounds in the last 4 years which she notes is partially explained by her healthier diet. She maintains 3 month follow ups with her PCP.   She denies taking steroids or prednisone recently. She reports having pyelonephritis in September. Her last diverticulitis attack was more than 5 years ago. She denies any signs of bleeding or changes in breathing.   She notes that over the last 6 months she has developed night sweats. She also notes having discussed this with her PCP.   She takes Allopurinol and has managed her gout well with diet changes that have limited greasy, fried foods. Her first and last gout attack was several years ago.   She notes that she has occasional bladder incontinence. She is open to giving a urine sample.   Of note prior to the patient's visit today, pt has had PET/CT completed on 09/02/17 with results revealing The slow growing superior segment left lower lobe nodule has a maximum SUV of 1.7. This nodule grew only extremely slowly over the last 7 years and while it could still represent a low-grade adenocarcinoma, the benefits of resection versus surveillance must be considered in the context of the patient's chronologic age.   Most recent lab results (04/04/18) of  CBC  is as follows: all values are WNL except for WBC at 13.1k, RBC at 3.49, Hgb at 9.6, HCT at 30.5, RDW at 17.3, ANC at 11.0k.  On review of systems, pt reports sinus drainage, occasional bladder incontinence, night sweats and denies nose bleeds, gum bleeds, blood in the stools, discomfort passing urine, abdominal pains, black stools, SOB, cough, chest pain, problems breathing, fevers, chills, night sweats, unexpected weight loss, new fatigue, new bone pains, noticing any lumps or bumps, pain in the back, breathing changes, abnormal vaginal discharge, vaginal bleeding, skin rashes, urinary discomfort, and any other symptoms.   On PMHx the pt reports essential HTN, cerebral infarction, GERD, cholelithiasis, gout, diverticulosis. On Social Hx the pt denies smoking.   MEDICAL HISTORY:  Past Medical History:  Diagnosis Date  . Adrenal adenoma 2006  . Boils 2009  . Cholelithiasis    asympt. w/normal HIDA 03/2010  . CVA (cerebral infarction) 2010   Cerebellar  . Diverticulitis   . Esophageal spasm 2011  . GERD (gastroesophageal reflux disease)   . Gout   . History of colon polyps   . HTN (hypertension)   . Hydronephrosis    LEFT/ Surgical intervention  . Hyperlipidemia   . LBP (low back pain)   . Osteoarthritis   . Pulmonary HTN ([place])   . Stress   . Type II or unspecified type diabetes mellitus without mention of complication, not stated as uncontrolled     SURGICAL HISTORY: Past Surgical History:  Procedure Laterality Date  . ABDOMINAL HYSTERECTOMY    .  BACK SURGERY    . BREAST BIOPSY     RIGHT  . CATARACT EXTRACTION, BILATERAL    . COLECTOMY  2006   Sigmoid  . FOOT SURGERY     BILATERAL  . HAMMER TOE SURGERY    . ROTATOR CUFF REPAIR  2008   RIGHT  . TOTAL KNEE ARTHROPLASTY  2003   LEFT  . VARICOSE VEIN SURGERY     vein stripping/lower extremities    SOCIAL HISTORY: Social History   Socioeconomic History  . Marital status: Widowed    Spouse name: Breanna [name]    . Number of children: Not on file  . Years of education: Not on file  . Highest education level: Not on file  Occupational History  . Occupation: Retired    Fish farm manager: RETIRED  Social Needs  . Financial resource strain: Not on file  . Food insecurity:    Worry: Not on file    Inability: Not on file  . Transportation needs:    Medical: Not on file    Non-medical: Not on file  Tobacco Use  . Smoking status: Never Smoker  . Smokeless tobacco: Never Used  Substance and Sexual Activity  . Alcohol use: No    Alcohol/week: 0.0 oz  . Drug use: No  . Sexual activity: Not Currently  Lifestyle  . Physical activity:    Days per week: Not on file    Minutes per session: Not on file  . Stress: Not on file  Relationships  . Social connections:    Talks on phone: Not on file    Gets together: Not on file    Attends religious service: Not on file    Active member of club or organization: Not on file    Attends meetings of clubs or organizations: Not on file    Relationship status: Not on file  . Intimate partner violence:    Fear of current or ex partner: Not on file    Emotionally abused: Not on file    Physically abused: Not on file    Forced sexual activity: Not on file  Other Topics Concern  . Not on file  Social History Narrative  . Not on file    FAMILY HISTORY: Family History  Problem Relation Age of Onset  . Prostate cancer Father   . Hypertension Father   . Cancer Father        prostate  . Heart disease Mother   . Diabetes Mother   . Diabetes Other        1st degree relative  . Heart disease Other   . Hypertension Other   . Prostate cancer Maternal Uncle   . Cancer Maternal Uncle        prostate  . Breast cancer Daughter   . Cancer Daughter        breast  . Colon cancer Neg Hx     ALLERGIES:  is allergic to aspirin; atenolol; codeine; codeine sulfate; hydrochlorothiazide; hydrocodone; ibuprofen; lisinopril; onion; valsartan; verapamil; and adhesive   [tape].  MEDICATIONS:  Current Outpatient Medications  Medication Sig Dispense Refill  . allopurinol (ZYLOPRIM) 300 MG tablet TAKE 1 TABLET EVERY DAY 90 tablet 3  . amLODipine (NORVASC) 5 MG tablet TAKE 1 TABLET BY MOUTH EVERY DAY 90 tablet 3  . aspirin 81 MG EC tablet Take 81 mg by mouth daily.      . benzonatate (TESSALON) 200 MG capsule TAKE 1 CAPSULE BY MOUTH 2 TIMES DAILY AS NEEDED FOR  COUGH. 60 capsule 1  . brimonidine (ALPHAGAN) 0.15 % ophthalmic solution 1 drop.    Marland Kitchen CALCIUM PO Take by mouth daily.    . carvedilol (COREG) 25 MG tablet TAKE 1 TABLET BY MOUTH TWICE A DAY WITH A MEAL 180 tablet 3  . Cholecalciferol (VITAMIN D3) 2000 UNITS capsule Take 2,000 Units by mouth daily.      . cyanocobalamin 2000 MCG tablet Take 2,000 mcg by mouth daily.    . diclofenac sodium (VOLTAREN) 1 % GEL Apply 2 g topically 2 (two) times daily. 200 g 3  . diphenhydramine-acetaminophen (TYLENOL PM) 25-500 MG TABS 1 tablet at bedtime as needed.    . dorzolamide-timolol (COSOPT) 22.3-6.8 MG/ML ophthalmic solution 1 drop.    Marland Kitchen gabapentin (NEURONTIN) 100 MG capsule Take 1 capsule (100 mg total) by mouth 3 (three) times daily. 270 capsule 1  . latanoprost (XALATAN) 0.005 % ophthalmic solution Place 1 drop into both eyes 2 (two) times daily.    Marland Kitchen losartan (COZAAR) 50 MG tablet TAKE 1 TABLET (50 MG TOTAL) BY MOUTH DAILY. 90 tablet 0  . metFORMIN (GLUCOPHAGE) 500 MG tablet Take 1 tablet (500 mg total) by mouth daily with breakfast. Keep schedule appt for future refill 90 tablet 0  . NON FORMULARY CBD oil    . pantoprazole (PROTONIX) 40 MG tablet Take 1 tablet (40 mg total) by mouth daily. 90 tablet 1  . sertraline (ZOLOFT) 100 MG tablet TAKE 1 TABLET BY MOUTH EVERY DAY 90 tablet 1  . timolol (TIMOPTIC) 0.25 % ophthalmic solution     . torsemide (DEMADEX) 20 MG tablet TAKE 1/2 TABLET (10MG ) BY MOUTH DAILY AS NEEDED FOR EDEMA (SWELLING) 90 tablet 2  . valACYclovir (VALTREX) 500 MG tablet Take 1 tablet (500 mg  total) by mouth 2 (two) times daily. Use prn x 5 days 20 tablet 1   No current facility-administered medications for this visit.     REVIEW OF SYSTEMS:    10 Point review of Systems was done is negative except as noted above.  PHYSICAL EXAMINATION: ECOG PERFORMANCE STATUS: 2  . Vitals:   05/15/18 1041  BP: (!) 157/80  Pulse: 60  Resp: 18  Temp: 98.2 F (36.8 C)  SpO2: 99%   Filed Weights   05/15/18 1041  Weight: 147 lb 11.2 oz (67 kg)   .Body mass index is 27.01 kg/m.  GENERAL:alert, in no acute distress and comfortable SKIN: no acute rashes, no significant lesions EYES: conjunctiva are pink and non-injected, sclera anicteric OROPHARYNX: MMM, no exudates, no oropharyngeal erythema or ulceration NECK: supple, no JVD LYMPH:  no palpable lymphadenopathy in the cervical, axillary or inguinal regions LUNGS: clear to auscultation b/l with normal respiratory effort HEART: regular rate & rhythm ABDOMEN:  normoactive bowel sounds , non tender, not distended. Extremity: no pedal edema PSYCH: alert & oriented x 3 with fluent speech NEURO: no focal motor/sensory deficits  LABORATORY DATA:  I have reviewed the data as listed  . CBC Latest Ref Rng & Units 05/15/2018 04/04/2018 08/13/2017  WBC 3.9 - 10.3 K/uL 6.3 13.1(H) 23.3 Repeated and verified X2.(HH)  Hemoglobin 11.6 - 15.9 g/dL 10.6(L) 9.6(L) 10.0(L)  Hematocrit 34.8 - 46.6 % 33.8(L) 30.5(L) 31.3(L)  Platelets 145 - 400 K/uL 199 197.0 264.0  NAC 4.1k . CBC    Component Value Date/Time   WBC 6.3 05/15/2018 1159   RBC 3.92 05/15/2018 1159   RBC 3.92 05/15/2018 1159   HGB 10.6 (L) 05/15/2018 1159   HCT 33.8 (  L) 05/15/2018 1159   PLT 199 05/15/2018 1159   MCV 86.2 05/15/2018 1159   MCH 27.0 05/15/2018 1159   MCHC 31.4 (L) 05/15/2018 1159   RDW 16.8 (H) 05/15/2018 1159   LYMPHSABS 1.4 05/15/2018 1159   MONOABS 0.5 05/15/2018 1159   EOSABS 0.3 05/15/2018 1159   BASOSABS 0.0 05/15/2018 1159    . CMP Latest Ref  Rng & Units 04/04/2018 03/14/2018 08/13/2017  Glucose 70 - 99 mg/dL 109(H) 93 135(H)  BUN 6 - 23 mg/dL 19 27(H) 25(H)  Creatinine 0.40 - 1.20 mg/dL 1.02 1.13 1.22(H)  Sodium 135 - 145 mEq/L 143 141 136  Potassium 3.5 - 5.1 mEq/L 3.8 4.4 3.6  Chloride 96 - 112 mEq/L 106 106 100  CO2 19 - 32 mEq/L 27 27 27   Calcium 8.4 - 10.5 mg/dL 9.0 9.2 9.6  Total Protein 6.0 - 8.3 g/dL - - 7.8  Total Bilirubin 0.2 - 1.2 mg/dL - - 1.0  Alkaline Phos 39 - 117 U/L - - 92  AST 0 - 37 U/L - - 15  ALT 0 - 35 U/L - - 9   Component     Latest Ref Rng & Units 05/15/2018  Color, Urine     YELLOW YELLOW  Appearance     CLEAR CLEAR  Specific Gravity, Urine     1.005 - 1.030 1.010  pH     5.0 - 8.0 7.0  Glucose     NEGATIVE mg/dL NEGATIVE  Hgb urine dipstick     NEGATIVE NEGATIVE  Bilirubin Urine     NEGATIVE NEGATIVE  Ketones, ur     NEGATIVE mg/dL NEGATIVE  Protein     NEGATIVE mg/dL 100 (A)  Nitrite     NEGATIVE NEGATIVE  Leukocytes, UA     NEGATIVE TRACE (A)  RBC / HPF     0 - 5 RBC/hpf 0-5  WBC, UA     0 - 5 WBC/hpf 11-20  Bacteria, UA     NONE SEEN RARE (A)  Squamous Epithelial / LPF     0 - 5 0-5  Mucus      PRESENT  IgG (Immunoglobin G), Serum     700 - 1,600 mg/dL 1,529  IgA     64 - 422 mg/dL 656 (H)  IgM (Immunoglobulin M), Srm     26 - 217 mg/dL 80  Total Protein ELP     6.0 - 8.5 g/dL 7.3  Albumin SerPl Elph-Mcnc     2.9 - 4.4 g/dL 3.2  Alpha 1     0.0 - 0.4 g/dL 0.3  Alpha2 Glob SerPl Elph-Mcnc     0.4 - 1.0 g/dL 1.0  B-Globulin SerPl Elph-Mcnc     0.7 - 1.3 g/dL 1.2  Gamma Glob SerPl Elph-Mcnc     0.4 - 1.8 g/dL 1.6  M Protein SerPl Elph-Mcnc     Not Observed g/dL Not Observed  Globulin, Total     2.2 - 3.9 g/dL 4.1 (H)  Albumin/Glob SerPl     0.7 - 1.7 0.8  IFE 1      Comment  Please Note (HCV):      Comment  Specimen Description      URINE, CLEAN CATCH . . .  Special Requests      NONE . Marland Kitchen .  Culture      70,000 COLONIES/mL ESCHERICHIA COLI (A)   Report Status      05/17/2018 FINAL  Organism ID, Bacteria  ESCHERICHIA COLI (A)  Iron     41 - 142 ug/dL 45  TIBC     236 - 444 ug/dL 286  Saturation Ratios     21 - 57 % 16 (L)  UIBC     ug/dL 241  Kappa free light chain     3.3 - 19.4 mg/L 107.0 (H)  Lamda free light chains     5.7 - 26.3 mg/L 74.4 (H)  Kappa, lamda light chain ratio     0.26 - 1.65 1.44  LDH     125 - 245 U/L 179  Haptoglobin     34 - 200 mg/dL 260 (H)  Sed Rate     0 - 22 mm/hr 62 (H)  Ferritin     9 - 269 ng/mL 136  Erythropoietin     2.6 - 18.5 mIU/mL 11.9  Vitamin B12     180 - 914 pg/mL 5,638 (H)     RADIOGRAPHIC STUDIES: I have personally reviewed the radiological images as listed and agreed with the findings in the report. Mm Screening Breast Tomo Bilateral  Result Date: 04/30/2018 CLINICAL DATA:  Screening. EXAM: DIGITAL SCREENING BILATERAL MAMMOGRAM WITH TOMO AND CAD COMPARISON:  Previous exam(s). ACR Breast Density Category b: There are scattered areas of fibroglandular density. FINDINGS: There are no findings suspicious for malignancy. Images were processed with CAD. IMPRESSION: No mammographic evidence of malignancy. A result letter of this screening mammogram will be mailed directly to the patient. RECOMMENDATION: Screening mammogram in one year. (Code:SM-B-01Y) BI-RADS CATEGORY  1: Negative. Electronically Signed   By: Curlene Dolphin M.D.   On: 04/30/2018 16:17    ASSESSMENT & PLAN:   84 y.o. female with  1. Neutrophilia Likely reactive Less likely MPN . No splenomegaly. BCR ABL neg PLAN --Discussed patient's most recent labs from 04/04/18, Sabin at 11.0k, WBC at 13.1k, Hgb at 9.6.  -Reviewed 09/02/17 PET scan which revealed the slow growing superior segment left lower lobe nodule has a maximum SUV of 1.7. This nodule grew only extremely slowly over the last 7 years -Review of her labs show that her ANC was high 9 months ago in the setting of a UTI. Prior to this, her CBC one year  ago showed normal WBC at 4.7k and ANC at 2.7k.  -rpt labs today show resolution of leucocytosis with nl WBC counts -- consistent with previous reactive increase in WBC's -Will collect more labs today and urinary culture   2. Normocytic Anemia SPEP neg LDH/haptoglobin - no evidence of hemolysis No overt iron or B12 deficiency Plan -mild hgb up to 10.6 -monitor  3. UTI E. COli UTI -sent prescription for keflex to her pharmacy.  4. Lung nodule LL -monitor with PCP with rpt CT chest in 6 months -consider referral to pulmonary for further evaluation of lung nodule  Labs today RTC with Dr Irene Limbo in 2 weeks    All of the patients questions were answered with apparent satisfaction. The patient knows to call the clinic with any problems, questions or concerns.  The toal time spent in the appt was 45 minutes and more than 50% was on counseling and direct patient cares.    Sullivan Lone MD MS AAHIVMS [facility] Hematology/Oncology Physician [facility]  (Office):       [phone] (Work cell):  [phone] (Fax):           [phone]  05/15/2018 10:45 AM  This document serves as a record of services personally performed  by Sullivan Lone, MD. It was created on his behalf by Baldwin Jamaica, a trained medical scribe. The creation of this record is based on the scribe's personal observations and the provider's statements to them.   .I have reviewed the above documentation for accuracy and completeness, and I agree with the above. Brunetta Genera MD MS

## 2018-05-15 ENCOUNTER — Telehealth: Payer: Self-pay | Admitting: Hematology

## 2018-05-15 ENCOUNTER — Other Ambulatory Visit: Payer: Self-pay

## 2018-05-15 ENCOUNTER — Inpatient Hospital Stay: Payer: Medicare Other | Attending: Hematology | Admitting: Hematology

## 2018-05-15 ENCOUNTER — Encounter: Payer: Self-pay | Admitting: Hematology

## 2018-05-15 ENCOUNTER — Inpatient Hospital Stay: Payer: Medicare Other

## 2018-05-15 VITALS — BP 157/80 | HR 60 | Temp 98.2°F | Resp 18 | Ht 62.0 in | Wt 147.7 lb

## 2018-05-15 DIAGNOSIS — R911 Solitary pulmonary nodule: Secondary | ICD-10-CM | POA: Insufficient documentation

## 2018-05-15 DIAGNOSIS — N390 Urinary tract infection, site not specified: Secondary | ICD-10-CM | POA: Insufficient documentation

## 2018-05-15 DIAGNOSIS — D649 Anemia, unspecified: Secondary | ICD-10-CM | POA: Insufficient documentation

## 2018-05-15 DIAGNOSIS — M109 Gout, unspecified: Secondary | ICD-10-CM | POA: Insufficient documentation

## 2018-05-15 DIAGNOSIS — I10 Essential (primary) hypertension: Secondary | ICD-10-CM | POA: Insufficient documentation

## 2018-05-15 DIAGNOSIS — D72829 Elevated white blood cell count, unspecified: Secondary | ICD-10-CM

## 2018-05-15 DIAGNOSIS — E119 Type 2 diabetes mellitus without complications: Secondary | ICD-10-CM | POA: Diagnosis not present

## 2018-05-15 DIAGNOSIS — B9620 Unspecified Escherichia coli [E. coli] as the cause of diseases classified elsewhere: Secondary | ICD-10-CM | POA: Insufficient documentation

## 2018-05-15 DIAGNOSIS — R61 Generalized hyperhidrosis: Secondary | ICD-10-CM | POA: Insufficient documentation

## 2018-05-15 DIAGNOSIS — N3000 Acute cystitis without hematuria: Secondary | ICD-10-CM

## 2018-05-15 LAB — CBC WITH DIFFERENTIAL/PLATELET
Basophils Absolute: 0 10*3/uL (ref 0.0–0.1)
Basophils Relative: 0 %
Eosinophils Absolute: 0.3 10*3/uL (ref 0.0–0.5)
Eosinophils Relative: 4 %
HCT: 33.8 % — ABNORMAL LOW (ref 34.8–46.6)
Hemoglobin: 10.6 g/dL — ABNORMAL LOW (ref 11.6–15.9)
Lymphocytes Relative: 23 %
Lymphs Abs: 1.4 10*3/uL (ref 0.9–3.3)
MCH: 27 pg (ref 25.1–34.0)
MCHC: 31.4 g/dL — ABNORMAL LOW (ref 31.5–36.0)
MCV: 86.2 fL (ref 79.5–101.0)
Monocytes Absolute: 0.5 10*3/uL (ref 0.1–0.9)
Monocytes Relative: 7 %
Neutro Abs: 4.1 10*3/uL (ref 1.5–6.5)
Neutrophils Relative %: 66 %
Platelets: 199 10*3/uL (ref 145–400)
RBC: 3.92 MIL/uL (ref 3.70–5.45)
RDW: 16.8 % — ABNORMAL HIGH (ref 11.2–14.5)
WBC: 6.3 10*3/uL (ref 3.9–10.3)

## 2018-05-15 LAB — URINALYSIS, COMPLETE (UACMP) WITH MICROSCOPIC
Bilirubin Urine: NEGATIVE
Glucose, UA: NEGATIVE mg/dL
Hgb urine dipstick: NEGATIVE
Ketones, ur: NEGATIVE mg/dL
Nitrite: NEGATIVE
Protein, ur: 100 mg/dL — AB
Specific Gravity, Urine: 1.01 (ref 1.005–1.030)
pH: 7 (ref 5.0–8.0)

## 2018-05-15 LAB — FERRITIN: Ferritin: 136 ng/mL (ref 9–269)

## 2018-05-15 LAB — IRON AND TIBC
Iron: 45 ug/dL (ref 41–142)
Saturation Ratios: 16 % — ABNORMAL LOW (ref 21–57)
TIBC: 286 ug/dL (ref 236–444)
UIBC: 241 ug/dL

## 2018-05-15 LAB — VITAMIN B12: Vitamin B-12: 5638 pg/mL — ABNORMAL HIGH (ref 180–914)

## 2018-05-15 LAB — LACTATE DEHYDROGENASE: LDH: 179 U/L (ref 125–245)

## 2018-05-15 LAB — RETICULOCYTES
RBC.: 3.92 MIL/uL (ref 3.70–5.45)
Retic Count, Absolute: 51 10*3/uL (ref 33.7–90.7)
Retic Ct Pct: 1.3 % (ref 0.7–2.1)

## 2018-05-15 LAB — SEDIMENTATION RATE: Sed Rate: 62 mm/hr — ABNORMAL HIGH (ref 0–22)

## 2018-05-15 LAB — SAVE SMEAR

## 2018-05-15 NOTE — Telephone Encounter (Signed)
Appointments scheduled AVS/Calendar printed per 5/23 los °

## 2018-05-16 LAB — KAPPA/LAMBDA LIGHT CHAINS
Kappa free light chain: 107 mg/L — ABNORMAL HIGH (ref 3.3–19.4)
Kappa, lambda light chain ratio: 1.44 (ref 0.26–1.65)
Lambda free light chains: 74.4 mg/L — ABNORMAL HIGH (ref 5.7–26.3)

## 2018-05-16 LAB — HAPTOGLOBIN: Haptoglobin: 260 mg/dL — ABNORMAL HIGH (ref 34–200)

## 2018-05-17 LAB — URINE CULTURE: Culture: 70000 — AB

## 2018-05-17 LAB — ERYTHROPOIETIN: Erythropoietin: 11.9 m[IU]/mL (ref 2.6–18.5)

## 2018-05-21 ENCOUNTER — Other Ambulatory Visit: Payer: Self-pay | Admitting: Internal Medicine

## 2018-05-21 NOTE — Progress Notes (Signed)
error 

## 2018-05-22 LAB — MULTIPLE MYELOMA PANEL, SERUM
Albumin SerPl Elph-Mcnc: 3.2 g/dL (ref 2.9–4.4)
Albumin/Glob SerPl: 0.8 (ref 0.7–1.7)
Alpha 1: 0.3 g/dL (ref 0.0–0.4)
Alpha2 Glob SerPl Elph-Mcnc: 1 g/dL (ref 0.4–1.0)
B-Globulin SerPl Elph-Mcnc: 1.2 g/dL (ref 0.7–1.3)
Gamma Glob SerPl Elph-Mcnc: 1.6 g/dL (ref 0.4–1.8)
Globulin, Total: 4.1 g/dL — ABNORMAL HIGH (ref 2.2–3.9)
IgA: 656 mg/dL — ABNORMAL HIGH (ref 64–422)
IgG (Immunoglobin G), Serum: 1529 mg/dL (ref 700–1600)
IgM (Immunoglobulin M), Srm: 80 mg/dL (ref 26–217)
Total Protein ELP: 7.3 g/dL (ref 6.0–8.5)

## 2018-05-25 MED ORDER — CEPHALEXIN 500 MG PO CAPS: 500.0000 mg | ORAL_CAPSULE | Freq: Two times a day (BID) | ORAL | 0 refills | Status: DC

## 2018-05-26 NOTE — Progress Notes (Signed)
HEMATOLOGY/ONCOLOGY CONSULTATION NOTE  Date of Service: 05/27/18  Patient Care Team: Cassandria Anger, MD as PCP - General (Internal Medicine) Carolan Clines, MD as Consulting Physician (Urology) Melina Schools, MD as Consulting Physician (Orthopedic Surgery)  CHIEF COMPLAINTS/PURPOSE OF CONSULTATION:  Neutrophilia  HISTORY OF PRESENTING ILLNESS:   Breanna [name] is a wonderful 84 y.o. female who has been referred to [place] by Dr Lew Dawes for evaluation and management of an abnormal CBC. She is accompanied today by her neighbor. The pt reports that she is doing well overall.   The pt reports that she hasn't been able to tell any changes in how she feels in the last 6 months. She has lost 50 pounds in the last 4 years which she notes is partially explained by her healthier diet. She maintains 3 month follow ups with her PCP.   She denies taking steroids or prednisone recently. She reports having pyelonephritis in September. Her last diverticulitis attack was more than 5 years ago. She denies any signs of bleeding or changes in breathing.   She notes that over the last 6 months she has developed night sweats. She also notes having discussed this with her PCP.   She takes Allopurinol and has managed her gout well with diet changes that have limited greasy, fried foods. Her first and last gout attack was several years ago.   She notes that she has occasional bladder incontinence. She is open to giving a urine sample.   Of note prior to the patient's visit today, pt has had PET/CT completed on 09/02/17 with results revealing The slow growing superior segment left lower lobe nodule has a maximum SUV of 1.7. This nodule grew only extremely slowly over the last 7 years and while it could still represent a low-grade adenocarcinoma, the benefits of resection versus surveillance must be considered in the context of the patient's chronologic age.   Most recent lab results (04/04/18) of  CBC  is as follows: all values are WNL except for WBC at 13.1k, RBC at 3.49, Hgb at 9.6, HCT at 30.5, RDW at 17.3, ANC at 11.0k.  On review of systems, pt reports sinus drainage, occasional bladder incontinence, night sweats and denies nose bleeds, gum bleeds, blood in the stools, discomfort passing urine, abdominal pains, black stools, SOB, cough, chest pain, problems breathing, fevers, chills, night sweats, unexpected weight loss, new fatigue, new bone pains, noticing any lumps or bumps, pain in the back, breathing changes, abnormal vaginal discharge, vaginal bleeding, skin rashes, urinary discomfort, and any other symptoms.   On PMHx the pt reports essential HTN, cerebral infarction, GERD, cholelithiasis, gout, diverticulosis. On Social Hx the pt denies smoking.  Interval History:   [name] returns today regarding her neutrophilia. The patient's last visit with [place] was on 05/15/18. She is accompanied today by a friend. The pt reports that she is doing well overall.   The pt reports that she has not been drinking very much water and denies any urinary incontinence. She was last treated for a UTI in September and before that was last treated for a UTI about 5-6 years ago. She notes that she had urinary blockages in the distant past after her pregnancies.   She notes that she continues to take her BP medications.   Lab results (05/15/18) of CBC and Reticulocytes is as follows: all values are WNL except for Hgb at 10.6, HCT at 33.8, MCHC at 31.4, RDW at 16.8. ANC was WNL at 4.1k  Urine culture 05/15/18 was positive for 70k colonies/mL of E. Coli. Urinalysis 05/15/18 revealed Protein at 100mg , trace Leukocytes and the noted E. Coli Haptoglbin 05/15/18 was elevated at 260 Sed Rate 05/15/18 was elevated at 62 Iron/TIBC 05/15/18 showed 16% saturation ratio MMP 05/15/18 showed IgA at 656 and Globulin total at 4.1 Vitamin B12 05/15/18 elevated at 5638  On review of systems, pt reports stable energy levels,  and denies urinary incontinence, chest pain, shortness of breath, coughing, and any other symptoms.   MEDICAL HISTORY:  Past Medical History:  Diagnosis Date  . Adrenal adenoma 2006  . Boils 2009  . Cholelithiasis    asympt. w/normal HIDA 03/2010  . CVA (cerebral infarction) 2010   Cerebellar  . Diverticulitis   . Esophageal spasm 2011  . GERD (gastroesophageal reflux disease)   . Gout   . History of colon polyps   . HTN (hypertension)   . Hydronephrosis    LEFT/ Surgical intervention  . Hyperlipidemia   . LBP (low back pain)   . Osteoarthritis   . Pulmonary HTN ([place])   . Stress   . Type II or unspecified type diabetes mellitus without mention of complication, not stated as uncontrolled     SURGICAL HISTORY: Past Surgical History:  Procedure Laterality Date  . ABDOMINAL HYSTERECTOMY    . BACK SURGERY    . BREAST BIOPSY     RIGHT  . CATARACT EXTRACTION, BILATERAL    . COLECTOMY  2006   Sigmoid  . FOOT SURGERY     BILATERAL  . HAMMER TOE SURGERY    . ROTATOR CUFF REPAIR  2008   RIGHT  . TOTAL KNEE ARTHROPLASTY  2003   LEFT  . VARICOSE VEIN SURGERY     vein stripping/lower extremities    SOCIAL HISTORY: Social History   Socioeconomic History  . Marital status: Widowed    Spouse name: Breanna [name]  . Number of children: Not on file  . Years of education: Not on file  . Highest education level: Not on file  Occupational History  . Occupation: Retired    Fish farm manager: RETIRED  Social Needs  . Financial resource strain: Not on file  . Food insecurity:    Worry: Not on file    Inability: Not on file  . Transportation needs:    Medical: Not on file    Non-medical: Not on file  Tobacco Use  . Smoking status: Never Smoker  . Smokeless tobacco: Never Used  Substance and Sexual Activity  . Alcohol use: No    Alcohol/week: 0.0 oz  . Drug use: No  . Sexual activity: Not Currently  Lifestyle  . Physical activity:    Days per week: Not on file    Minutes per  session: Not on file  . Stress: Not on file  Relationships  . Social connections:    Talks on phone: Not on file    Gets together: Not on file    Attends religious service: Not on file    Active member of club or organization: Not on file    Attends meetings of clubs or organizations: Not on file    Relationship status: Not on file  . Intimate partner violence:    Fear of current or ex partner: Not on file    Emotionally abused: Not on file    Physically abused: Not on file    Forced sexual activity: Not on file  Other Topics Concern  . Not on file  Social  History Narrative  . Not on file    FAMILY HISTORY: Family History  Problem Relation Age of Onset  . Prostate cancer Father   . Hypertension Father   . Cancer Father        prostate  . Heart disease Mother   . Diabetes Mother   . Diabetes Other        1st degree relative  . Heart disease Other   . Hypertension Other   . Prostate cancer Maternal Uncle   . Cancer Maternal Uncle        prostate  . Breast cancer Daughter   . Cancer Daughter        breast  . Colon cancer Neg Hx     ALLERGIES:  is allergic to aspirin; atenolol; codeine; codeine sulfate; hydrochlorothiazide; hydrocodone; ibuprofen; lisinopril; onion; valsartan; verapamil; and adhesive  [tape].  MEDICATIONS:  Current Outpatient Medications  Medication Sig Dispense Refill  . allopurinol (ZYLOPRIM) 300 MG tablet TAKE 1 TABLET EVERY DAY 90 tablet 3  . amLODipine (NORVASC) 5 MG tablet TAKE 1 TABLET BY MOUTH EVERY DAY 90 tablet 3  . aspirin 81 MG EC tablet Take 81 mg by mouth daily.      . benzonatate (TESSALON) 200 MG capsule TAKE 1 CAPSULE BY MOUTH 2 TIMES DAILY AS NEEDED FOR COUGH. 60 capsule 1  . brimonidine (ALPHAGAN) 0.15 % ophthalmic solution 1 drop.    Marland Kitchen CALCIUM PO Take by mouth daily.    . carvedilol (COREG) 25 MG tablet TAKE 1 TABLET BY MOUTH TWICE A DAY WITH A MEAL 180 tablet 3  . cephALEXin (KEFLEX) 500 MG capsule Take 1 capsule (500 mg total)  by mouth 2 (two) times daily. 10 capsule 0  . Cholecalciferol (VITAMIN D3) 2000 UNITS capsule Take 2,000 Units by mouth daily.      . cyanocobalamin 2000 MCG tablet Take 2,000 mcg by mouth daily.    . diclofenac sodium (VOLTAREN) 1 % GEL Apply 2 g topically 2 (two) times daily. 200 g 3  . diphenhydramine-acetaminophen (TYLENOL PM) 25-500 MG TABS 1 tablet at bedtime as needed.    . dorzolamide-timolol (COSOPT) 22.3-6.8 MG/ML ophthalmic solution 1 drop.    Marland Kitchen gabapentin (NEURONTIN) 100 MG capsule Take 1 capsule (100 mg total) by mouth 3 (three) times daily. 270 capsule 1  . latanoprost (XALATAN) 0.005 % ophthalmic solution Place 1 drop into both eyes 2 (two) times daily.    Marland Kitchen losartan (COZAAR) 50 MG tablet TAKE 1 TABLET (50 MG TOTAL) BY MOUTH DAILY. 90 tablet 0  . metFORMIN (GLUCOPHAGE) 500 MG tablet Take 1 tablet (500 mg total) by mouth daily with breakfast. Keep schedule appt for future refill 90 tablet 0  . NON FORMULARY CBD oil    . pantoprazole (PROTONIX) 40 MG tablet TAKE 1 TABLET BY MOUTH EVERY DAY 90 tablet 1  . sertraline (ZOLOFT) 100 MG tablet TAKE 1 TABLET BY MOUTH EVERY DAY 90 tablet 1  . timolol (TIMOPTIC) 0.25 % ophthalmic solution     . torsemide (DEMADEX) 20 MG tablet TAKE 1/2 TABLET (10MG ) BY MOUTH DAILY AS NEEDED FOR EDEMA (SWELLING) 90 tablet 2  . valACYclovir (VALTREX) 500 MG tablet Take 1 tablet (500 mg total) by mouth 2 (two) times daily. Use prn x 5 days 20 tablet 1   No current facility-administered medications for this visit.     REVIEW OF SYSTEMS:    A 10+ POINT REVIEW OF SYSTEMS WAS OBTAINED including neurology, dermatology, psychiatry, cardiac, respiratory, lymph, extremities,  GI, GU, Musculoskeletal, constitutional, breasts, reproductive, HEENT.  All pertinent positives are noted in the HPI.  All others are negative.   PHYSICAL EXAMINATION: ECOG PERFORMANCE STATUS: 2  . Vitals:   05/27/18 1151  BP: (!) 170/69  Pulse: (!) 54  Resp: 17  Temp: 98.6 F (37 C)    SpO2: 98%   Filed Weights   05/27/18 1151  Weight: 146 lb 8 oz (66.5 kg)   .Body mass index is 26.8 kg/m.  GENERAL:alert, in no acute distress and comfortable SKIN: no acute rashes, no significant lesions EYES: conjunctiva are pink and non-injected, sclera anicteric OROPHARYNX: MMM, no exudates, no oropharyngeal erythema or ulceration NECK: supple, no JVD LYMPH:  no palpable lymphadenopathy in the cervical, axillary or inguinal regions LUNGS: clear to auscultation b/l with normal respiratory effort HEART: regular rate & rhythm ABDOMEN:  normoactive bowel sounds , non tender, not distended. Extremity: no pedal edema PSYCH: alert & oriented x 3 with fluent speech NEURO: no focal motor/sensory deficits   LABORATORY DATA:  I have reviewed the data as listed  . CBC Latest Ref Rng & Units 05/15/2018 04/04/2018 08/13/2017  WBC 3.9 - 10.3 K/uL 6.3 13.1(H) 23.3 Repeated and verified X2.(HH)  Hemoglobin 11.6 - 15.9 g/dL 10.6(L) 9.6(L) 10.0(L)  Hematocrit 34.8 - 46.6 % 33.8(L) 30.5(L) 31.3(L)  Platelets 145 - 400 K/uL 199 197.0 264.0  ANC 4.1k . CBC    Component Value Date/Time   WBC 6.3 05/15/2018 1159   RBC 3.92 05/15/2018 1159   RBC 3.92 05/15/2018 1159   HGB 10.6 (L) 05/15/2018 1159   HCT 33.8 (L) 05/15/2018 1159   PLT 199 05/15/2018 1159   MCV 86.2 05/15/2018 1159   MCH 27.0 05/15/2018 1159   MCHC 31.4 (L) 05/15/2018 1159   RDW 16.8 (H) 05/15/2018 1159   LYMPHSABS 1.4 05/15/2018 1159   MONOABS 0.5 05/15/2018 1159   EOSABS 0.3 05/15/2018 1159   BASOSABS 0.0 05/15/2018 1159    . CMP Latest Ref Rng & Units 04/04/2018 03/14/2018 08/13/2017  Glucose 70 - 99 mg/dL 109(H) 93 135(H)  BUN 6 - 23 mg/dL 19 27(H) 25(H)  Creatinine 0.40 - 1.20 mg/dL 1.02 1.13 1.22(H)  Sodium 135 - 145 mEq/L 143 141 136  Potassium 3.5 - 5.1 mEq/L 3.8 4.4 3.6  Chloride 96 - 112 mEq/L 106 106 100  CO2 19 - 32 mEq/L 27 27 27   Calcium 8.4 - 10.5 mg/dL 9.0 9.2 9.6  Total Protein 6.0 - 8.3 g/dL - -  7.8  Total Bilirubin 0.2 - 1.2 mg/dL - - 1.0  Alkaline Phos 39 - 117 U/L - - 92  AST 0 - 37 U/L - - 15  ALT 0 - 35 U/L - - 9   Component     Latest Ref Rng & Units 05/15/2018  Color, Urine     YELLOW YELLOW  Appearance     CLEAR CLEAR  Specific Gravity, Urine     1.005 - 1.030 1.010  pH     5.0 - 8.0 7.0  Glucose     NEGATIVE mg/dL NEGATIVE  Hgb urine dipstick     NEGATIVE NEGATIVE  Bilirubin Urine     NEGATIVE NEGATIVE  Ketones, ur     NEGATIVE mg/dL NEGATIVE  Protein     NEGATIVE mg/dL 100 (A)  Nitrite     NEGATIVE NEGATIVE  Leukocytes, UA     NEGATIVE TRACE (A)  RBC / HPF     0 - 5 RBC/hpf 0-5  WBC, UA     0 - 5 WBC/hpf 11-20  Bacteria, UA     NONE SEEN RARE (A)  Squamous Epithelial / LPF     0 - 5 0-5  Mucus      PRESENT  IgG (Immunoglobin G), Serum     700 - 1,600 mg/dL 1,529  IgA     64 - 422 mg/dL 656 (H)  IgM (Immunoglobulin M), Srm     26 - 217 mg/dL 80  Total Protein ELP     6.0 - 8.5 g/dL 7.3  Albumin SerPl Elph-Mcnc     2.9 - 4.4 g/dL 3.2  Alpha 1     0.0 - 0.4 g/dL 0.3  Alpha2 Glob SerPl Elph-Mcnc     0.4 - 1.0 g/dL 1.0  B-Globulin SerPl Elph-Mcnc     0.7 - 1.3 g/dL 1.2  Gamma Glob SerPl Elph-Mcnc     0.4 - 1.8 g/dL 1.6  M Protein SerPl Elph-Mcnc     Not Observed g/dL Not Observed  Globulin, Total     2.2 - 3.9 g/dL 4.1 (H)  Albumin/Glob SerPl     0.7 - 1.7 0.8  IFE 1      Comment  Please Note (HCV):      Comment  Specimen Description      URINE, CLEAN CATCH . . .  Special Requests      NONE . Marland Kitchen .  Culture      70,000 COLONIES/mL ESCHERICHIA COLI (A)  Report Status      05/17/2018 FINAL  Organism ID, Bacteria      ESCHERICHIA COLI (A)  Iron     41 - 142 ug/dL 45  TIBC     236 - 444 ug/dL 286  Saturation Ratios     21 - 57 % 16 (L)  UIBC     ug/dL 241  Kappa free light chain     3.3 - 19.4 mg/L 107.0 (H)  Lamda free light chains     5.7 - 26.3 mg/L 74.4 (H)  Kappa, lamda light chain ratio     0.26 - 1.65 1.44   LDH     125 - 245 U/L 179  Haptoglobin     34 - 200 mg/dL 260 (H)  Sed Rate     0 - 22 mm/hr 62 (H)  Ferritin     9 - 269 ng/mL 136  Erythropoietin     2.6 - 18.5 mIU/mL 11.9  Vitamin B12     180 - 914 pg/mL 5,638 (H)     RADIOGRAPHIC STUDIES: I have personally reviewed the radiological images as listed and agreed with the findings in the report. Mm Screening Breast Tomo Bilateral  Result Date: 04/30/2018 CLINICAL DATA:  Screening. EXAM: DIGITAL SCREENING BILATERAL MAMMOGRAM WITH TOMO AND CAD COMPARISON:  Previous exam(s). ACR Breast Density Category b: There are scattered areas of fibroglandular density. FINDINGS: There are no findings suspicious for malignancy. Images were processed with CAD. IMPRESSION: No mammographic evidence of malignancy. A result letter of this screening mammogram will be mailed directly to the patient. RECOMMENDATION: Screening mammogram in one year. (Code:SM-B-01Y) BI-RADS CATEGORY  1: Negative. Electronically Signed   By: Curlene Dolphin M.D.   On: 04/30/2018 16:17    ASSESSMENT & PLAN:   84 y.o. female with  1. Neutrophilia Likely reactive Less likely MPN . No splenomegaly. BCR ABL neg PLAN --Discussed patient's labs from 04/04/18, ANC at 11.0k, WBC at 13.1k, Hgb at 9.6.  -  Reviewed 09/02/17 PET scan which revealed the slow growing superior segment left lower lobe nodule has a maximum SUV of 1.7. This nodule grew only extremely slowly over the last 7 years -Review of her labs show that her ANC was high 9 months ago in the setting of a UTI. Prior to this, her CBC one year ago showed normal WBC at 4.7k and ANC at 2.7k.  -Discussed pt labwork from 05/15/18; WBC normalized at 6.3k and ANC normalized at 4.1k. -BM problem not indicated, reactive process to a passing infection is favored -Urine culture was significant for E coli -I ordered Cephalexin and the pt will pick this up from her pharmacy today -BCR-ABL 1 FISH is neg -Recommend cranberry juice for  future UTI prevention, drinking more water, and emptying her bladder regularly -Will see pt again as needed  2. Normocytic Anemia SPEP neg LDH/haptoglobin - no evidence of hemolysis No overt iron or B12 deficiency Plan -mild hgb up to 10.6 -monitor  3. UTI E. COli UTI -Urinary culture significant for E coli and Keflex has been sent to pt's pharmacy  4. Lung nodule LL -monitor with PCP with rpt CT chest in 6 months -consider referral to pulmonary for further evaluation of lung nodule -Recommend PCP continue to monitor lung left lower nodule as picked up on 09/02/17 PET     RTC with Dr Irene Limbo as needed   All of the patients questions were answered with apparent satisfaction. The patient knows to call the clinic with any problems, questions or concerns.  The total time spent in the appt was 20 minutes and more than 50% was on counseling and direct patient cares.    Sullivan Lone MD MS AAHIVMS [facility] Hematology/Oncology Physician [facility]  (Office):       [phone] (Work cell):  [phone] (Fax):           [phone]  05/27/2018 12:48 PM  I, Baldwin [place], am acting as a Education administrator for Dr Irene Limbo.   .I have reviewed the above documentation for accuracy and completeness, and I agree with the above. Brunetta Genera MD

## 2018-05-27 ENCOUNTER — Encounter: Payer: Self-pay | Admitting: Hematology

## 2018-05-27 ENCOUNTER — Inpatient Hospital Stay: Payer: Medicare Other | Attending: Hematology | Admitting: Hematology

## 2018-05-27 VITALS — BP 170/69 | HR 54 | Temp 98.6°F | Resp 17 | Ht 62.0 in | Wt 146.5 lb

## 2018-05-27 DIAGNOSIS — B9620 Unspecified Escherichia coli [E. coli] as the cause of diseases classified elsewhere: Secondary | ICD-10-CM | POA: Insufficient documentation

## 2018-05-27 DIAGNOSIS — D649 Anemia, unspecified: Secondary | ICD-10-CM | POA: Diagnosis not present

## 2018-05-27 DIAGNOSIS — I10 Essential (primary) hypertension: Secondary | ICD-10-CM | POA: Diagnosis not present

## 2018-05-27 DIAGNOSIS — D72829 Elevated white blood cell count, unspecified: Secondary | ICD-10-CM | POA: Diagnosis not present

## 2018-05-27 DIAGNOSIS — N390 Urinary tract infection, site not specified: Secondary | ICD-10-CM | POA: Insufficient documentation

## 2018-05-27 DIAGNOSIS — R911 Solitary pulmonary nodule: Secondary | ICD-10-CM | POA: Diagnosis not present

## 2018-05-27 DIAGNOSIS — N3000 Acute cystitis without hematuria: Secondary | ICD-10-CM

## 2018-05-27 LAB — BCR ABL1 FISH (GENPATH)

## 2018-06-11 ENCOUNTER — Other Ambulatory Visit: Payer: Self-pay | Admitting: Internal Medicine

## 2018-06-13 ENCOUNTER — Ambulatory Visit: Payer: Medicare Other | Admitting: Internal Medicine

## 2018-06-15 ENCOUNTER — Other Ambulatory Visit: Payer: Self-pay | Admitting: Internal Medicine

## 2018-07-07 ENCOUNTER — Ambulatory Visit: Payer: Medicare Other | Admitting: *Deleted

## 2018-07-07 VITALS — BP 143/64 | HR 57 | Resp 18 | Ht 62.0 in | Wt 145.0 lb

## 2018-07-07 DIAGNOSIS — Z0000 Encounter for general adult medical examination without abnormal findings: Secondary | ICD-10-CM

## 2018-07-07 DIAGNOSIS — E1142 Type 2 diabetes mellitus with diabetic polyneuropathy: Secondary | ICD-10-CM

## 2018-07-07 NOTE — Patient Instructions (Addendum)
Continue doing brain stimulating activities (puzzles, reading, adult coloring books, staying active) to keep memory sharp.   Continue to eat heart healthy diet (full of fruits, vegetables, whole grains, lean protein, water--limit salt, fat, and sugar intake) and increase physical activity as tolerated.  Lifeline: http://www.lifelinesys.com/content/home; 848-350-7247 [phone]   Please ask eye doctor to send report to Dr. Alain Marion  Fax# [phone]  It is important to avoid accidents which may result in broken bones.  Here are a few ideas on how to make your home safer so you will be less likely to trip or fall.  1. Use nonskid mats or non slip strips in your shower or tub, on your bathroom floor and around sinks.  If you know that you have spilled water, wipe it up! 2. In the bathroom, it is important to have properly installed grab bars on the walls or on the edge of the tub.  Towel racks are NOT strong enough for you to hold onto or to pull on for support. 3. Stairs and hallways should have enough light.  Add lamps or night lights if you need ore light. 4. It is good to have handrails on both sides of the stairs if possible.  Always fix broken handrails right away. 5. It is important to see the edges of steps.  Paint the edges of outdoor steps white so you can see them better.  Put colored tape on the edge of inside steps. 6. Throw-rugs are dangerous because they can slide.  Removing the rugs is the best idea, but if they must stay, add adhesive carpet tape to prevent slipping. 7. Do not keep things on stairs or in the halls.  Remove small furniture that blocks the halls as it may cause you to trip.  Keep telephone and electrical cords out of the way where you walk. 8. Always were sturdy, rubber-soled shoes for good support.  Never wear just socks, especially on the stairs.  Socks may cause you to slip or fall.  Do not wear full-length housecoats as you can easily trip on the bottom.  9. Place  the things you use the most on the shelves that are the easiest to reach.  If you use a stepstool, make sure it is in good condition.  If you feel unsteady, DO NOT climb, ask for help. 10. If a health professional advises you to use a cane or walker, do not be ashamed.  These items can keep you from falling and breaking your bones.  Health Maintenance, Female Adopting a healthy lifestyle and getting preventive care can go a long way to promote health and wellness. Talk with your health care provider about what schedule of regular examinations is right for you. This is a good chance for you to check in with your provider about disease prevention and staying healthy. In between checkups, there are plenty of things you can do on your own. Experts have done a lot of research about which lifestyle changes and preventive measures are most likely to keep you healthy. Ask your health care provider for more information. Weight and diet Eat a healthy diet  Be sure to include plenty of vegetables, fruits, low-fat dairy products, and lean protein.  Do not eat a lot of foods high in solid fats, added sugars, or salt.  Get regular exercise. This is one of the most important things you can do for your health. ? Most adults should exercise for at least 150 minutes each week. The exercise should  increase your heart rate and make you sweat (moderate-intensity exercise). ? Most adults should also do strengthening exercises at least twice a week. This is in addition to the moderate-intensity exercise.  Maintain a healthy weight  Body mass index (BMI) is a measurement that can be used to identify possible weight problems. It estimates body fat based on height and weight. Your health care provider can help determine your BMI and help you achieve or maintain a healthy weight.  For females 65 years of age and older: ? A BMI below 18.5 is considered underweight. ? A BMI of 18.5 to 24.9 is normal. ? A BMI of 25 to  29.9 is considered overweight. ? A BMI of 30 and above is considered obese.  Watch levels of cholesterol and blood lipids  You should start having your blood tested for lipids and cholesterol at 82 years of age, then have this test every 5 years.  You may need to have your cholesterol levels checked more often if: ? Your lipid or cholesterol levels are high. ? You are older than 82 years of age. ? You are at high risk for heart disease.  Cancer screening Lung Cancer  Lung cancer screening is recommended for adults 62-83 years old who are at high risk for lung cancer because of a history of smoking.  A yearly low-dose CT scan of the lungs is recommended for people who: ? Currently smoke. ? Have quit within the past 15 years. ? Have at least a 30-pack-year history of smoking. A pack year is smoking an average of one pack of cigarettes a day for 1 year.  Yearly screening should continue until it has been 15 years since you quit.  Yearly screening should stop if you develop a health problem that would prevent you from having lung cancer treatment.  Breast Cancer  Practice breast self-awareness. This means understanding how your breasts normally appear and feel.  It also means doing regular breast self-exams. Let your health care provider know about any changes, no matter how small.  If you are in your 20s or 30s, you should have a clinical breast exam (CBE) by a health care provider every 1-3 years as part of a regular health exam.  If you are 80 or older, have a CBE every year. Also consider having a breast X-ray (mammogram) every year.  If you have a family history of breast cancer, talk to your health care provider about genetic screening.  If you are at high risk for breast cancer, talk to your health care provider about having an MRI and a mammogram every year.  Breast cancer gene (BRCA) assessment is recommended for women who have family members with BRCA-related cancers.  BRCA-related cancers include: ? Breast. ? Ovarian. ? Tubal. ? Peritoneal cancers.  Results of the assessment will determine the need for genetic counseling and BRCA1 and BRCA2 testing.  Cervical Cancer Your health care provider may recommend that you be screened regularly for cancer of the pelvic organs (ovaries, uterus, and vagina). This screening involves a pelvic examination, including checking for microscopic changes to the surface of your cervix (Pap test). You may be encouraged to have this screening done every 3 years, beginning at age 62.  For women ages 29-65, health care providers may recommend pelvic exams and Pap testing every 3 years, or they may recommend the Pap and pelvic exam, combined with testing for human papilloma virus (HPV), every 5 years. Some types of HPV increase your risk of  cervical cancer. Testing for HPV may also be done on women of any age with unclear Pap test results.  Other health care providers may not recommend any screening for nonpregnant women who are considered low risk for pelvic cancer and who do not have symptoms. Ask your health care provider if a screening pelvic exam is right for you.  If you have had past treatment for cervical cancer or a condition that could lead to cancer, you need Pap tests and screening for cancer for at least 20 years after your treatment. If Pap tests have been discontinued, your risk factors (such as having a new sexual partner) need to be reassessed to determine if screening should resume. Some women have medical problems that increase the chance of getting cervical cancer. In these cases, your health care provider may recommend more frequent screening and Pap tests.  Colorectal Cancer  This type of cancer can be detected and often prevented.  Routine colorectal cancer screening usually begins at 82 years of age and continues through 82 years of age.  Your health care provider may recommend screening at an earlier age if  you have risk factors for colon cancer.  Your health care provider may also recommend using home test kits to check for hidden blood in the stool.  A small camera at the end of a tube can be used to examine your colon directly (sigmoidoscopy or colonoscopy). This is done to check for the earliest forms of colorectal cancer.  Routine screening usually begins at age 28.  Direct examination of the colon should be repeated every 5-10 years through 82 years of age. However, you may need to be screened more often if early forms of precancerous polyps or small growths are found.  Skin Cancer  Check your skin from head to toe regularly.  Tell your health care provider about any new moles or changes in moles, especially if there is a change in a mole's shape or color.  Also tell your health care provider if you have a mole that is larger than the size of a pencil eraser.  Always use sunscreen. Apply sunscreen liberally and repeatedly throughout the day.  Protect yourself by wearing long sleeves, pants, a wide-brimmed hat, and sunglasses whenever you are outside.  Heart disease, diabetes, and high blood pressure  High blood pressure causes heart disease and increases the risk of stroke. High blood pressure is more likely to develop in: ? People who have blood pressure in the high end of the normal range (130-139/85-89 mm Hg). ? People who are overweight or obese. ? People who are African American.  If you are 7-37 years of age, have your blood pressure checked every 3-5 years. If you are 34 years of age or older, have your blood pressure checked every year. You should have your blood pressure measured twice-once when you are at a hospital or clinic, and once when you are not at a hospital or clinic. Record the average of the two measurements. To check your blood pressure when you are not at a hospital or clinic, you can use: ? An automated blood pressure machine at a pharmacy. ? A home blood  pressure monitor.  If you are between 38 years and 31 years old, ask your health care provider if you should take aspirin to prevent strokes.  Have regular diabetes screenings. This involves taking a blood sample to check your fasting blood sugar level. ? If you are at a normal weight and have  a low risk for diabetes, have this test once every three years after 82 years of age. ? If you are overweight and have a high risk for diabetes, consider being tested at a younger age or more often. Preventing infection Hepatitis B  If you have a higher risk for hepatitis B, you should be screened for this virus. You are considered at high risk for hepatitis B if: ? You were born in a country where hepatitis B is common. Ask your health care provider which countries are considered high risk. ? Your parents were born in a high-risk country, and you have not been immunized against hepatitis B (hepatitis B vaccine). ? You have HIV or AIDS. ? You use needles to inject street drugs. ? You live with someone who has hepatitis B. ? You have had sex with someone who has hepatitis B. ? You get hemodialysis treatment. ? You take certain medicines for conditions, including cancer, organ transplantation, and autoimmune conditions.  Hepatitis C  Blood testing is recommended for: ? Everyone born from [birth year] through 1963. ? Anyone with known risk factors for hepatitis C.  Sexually transmitted infections (STIs)  You should be screened for sexually transmitted infections (STIs) including gonorrhea and chlamydia if: ? You are sexually active and are younger than 82 years of age. ? You are older than 82 years of age and your health care provider tells you that you are at risk for this type of infection. ? Your sexual activity has changed since you were last screened and you are at an increased risk for chlamydia or gonorrhea. Ask your health care provider if you are at risk.  If you do not have HIV, but are at risk,  it may be recommended that you take a prescription medicine daily to prevent HIV infection. This is called pre-exposure prophylaxis (PrEP). You are considered at risk if: ? You are sexually active and do not regularly use condoms or know the HIV status of your partner(s). ? You take drugs by injection. ? You are sexually active with a partner who has HIV.  Talk with your health care provider about whether you are at high risk of being infected with HIV. If you choose to begin PrEP, you should first be tested for HIV. You should then be tested every 3 months for as long as you are taking PrEP. Pregnancy  If you are premenopausal and you may become pregnant, ask your health care provider about preconception counseling.  If you may become pregnant, take 400 to 800 micrograms (mcg) of folic acid every day.  If you want to prevent pregnancy, talk to your health care provider about birth control (contraception). Osteoporosis and menopause  Osteoporosis is a disease in which the bones lose minerals and strength with aging. This can result in serious bone fractures. Your risk for osteoporosis can be identified using a bone density scan.  If you are 80 years of age or older, or if you are at risk for osteoporosis and fractures, ask your health care provider if you should be screened.  Ask your health care provider whether you should take a calcium or vitamin D supplement to lower your risk for osteoporosis.  Menopause may have certain physical symptoms and risks.  Hormone replacement therapy may reduce some of these symptoms and risks. Talk to your health care provider about whether hormone replacement therapy is right for you. Follow these instructions at home:  Schedule regular health, dental, and eye exams.  Stay current  with your immunizations.  Do not use any tobacco products including cigarettes, chewing tobacco, or electronic cigarettes.  If you are pregnant, do not drink  alcohol.  If you are breastfeeding, limit how much and how often you drink alcohol.  Limit alcohol intake to no more than 1 drink per day for nonpregnant women. One drink equals 12 ounces of beer, 5 ounces of Lucio Litsey, or 1 ounces of hard liquor.  Do not use street drugs.  Do not share needles.  Ask your health care provider for help if you need support or information about quitting drugs.  Tell your health care provider if you often feel depressed.  Tell your health care provider if you have ever been abused or do not feel safe at home. This information is not intended to replace advice given to you by your health care provider. Make sure you discuss any questions you have with your health care provider. Document Released: 06/25/2011 Document Revised: 05/17/2016 Document Reviewed: 09/13/2015 [employer]vier Interactive Patient Education  [employer].

## 2018-07-07 NOTE — Progress Notes (Signed)
Subjective:   Breanna [name] is a 84 y.o. female who presents for Medicare Annual (Subsequent) preventive examination.  Review of Systems:  No ROS.  Medicare Wellness Visit. Additional risk factors are reflected in the social history.     Sleep patterns: gets up 1 times nightly to void and sleeps 7-8 hours nightly.    Home Safety/Smoke Alarms: Feels safe in home. Smoke alarms in place.  Living environment; residence and Firearm Safety: 1-story house/ trailer, equipment: Radio producer, Type: Single Electrical engineer, Type: Tub Surveyor, quantity, no firearms. Lives alone no needs for DME, good support system Seat Belt Safety/Bike Helmet: Wears seat belt.   Objective:     Vitals: BP (!) 143/64   Pulse (!) 57   Resp 18   Ht 5\' 2"  (1.575 m)   Wt 145 lb (65.8 kg)   SpO2 98%   BMI 26.52 kg/m   Body mass index is 26.52 kg/m.  Advanced Directives 07/07/2018 05/15/2018 07/04/2017 05/30/2015 01/12/2015 03/24/2013  Does Patient Have a Medical Advance Directive? No No Yes Yes No Patient does not have advance directive;Patient would not like information  Type of Advance Directive - - [place];Living will - - -  Copy of [place] in Chart? - - No - copy requested Yes - -  Would patient like information on creating a medical advance directive? No - Patient declined Yes (MAU/Ambulatory/Procedural Areas - Information given) - - No - patient declined information -  Pre-existing out of facility DNR order (yellow form or pink MOST form) - - - - - No    Tobacco Social History   Tobacco Use  Smoking Status Never Smoker  Smokeless Tobacco Never Used     Counseling given: Not Answered  Past Medical History:  Diagnosis Date  . Adrenal adenoma 2006  . Boils 2009  . Cholelithiasis    asympt. w/normal HIDA 03/2010  . CVA (cerebral infarction) 2010   Cerebellar  . Diverticulitis   . Esophageal spasm 2011  . GERD (gastroesophageal reflux disease)   . Gout   .  History of colon polyps   . HTN (hypertension)   . Hydronephrosis    LEFT/ Surgical intervention  . Hyperlipidemia   . LBP (low back pain)   . Osteoarthritis   . Pulmonary HTN ([place])   . Stress   . Type II or unspecified type diabetes mellitus without mention of complication, not stated as uncontrolled    Past Surgical History:  Procedure Laterality Date  . ABDOMINAL HYSTERECTOMY    . BACK SURGERY    . BREAST BIOPSY     RIGHT  . CATARACT EXTRACTION, BILATERAL    . COLECTOMY  2006   Sigmoid  . FOOT SURGERY     BILATERAL  . HAMMER TOE SURGERY    . ROTATOR CUFF REPAIR  2008   RIGHT  . TOTAL KNEE ARTHROPLASTY  2003   LEFT  . VARICOSE VEIN SURGERY     vein stripping/lower extremities   Family History  Problem Relation Age of Onset  . Prostate cancer Father   . Hypertension Father   . Cancer Father        prostate  . Heart disease Mother   . Diabetes Mother   . Diabetes Other        1st degree relative  . Heart disease Other   . Hypertension Other   . Prostate cancer Maternal Uncle   . Cancer Maternal Uncle  prostate  . Breast cancer Daughter   . Cancer Daughter        breast  . Colon cancer Neg Hx    Social History   Socioeconomic History  . Marital status: Widowed    Spouse name: Analya Louissaint  . Number of children: Not on file  . Years of education: Not on file  . Highest education level: Not on file  Occupational History  . Occupation: Retired    Fish farm manager: RETIRED  Social Needs  . Financial resource strain: Not hard at all  . Food insecurity:    Worry: Never true    Inability: Never true  . Transportation needs:    Medical: No    Non-medical: No  Tobacco Use  . Smoking status: Never Smoker  . Smokeless tobacco: Never Used  Substance and Sexual Activity  . Alcohol use: No    Alcohol/week: 0.0 oz  . Drug use: No  . Sexual activity: Not Currently  Lifestyle  . Physical activity:    Days per week: 0 days    Minutes per session: 0 min  .  Stress: Not at all  Relationships  . Social connections:    Talks on phone: More than three times a week    Gets together: More than three times a week    Attends religious service: More than 4 times per year    Active member of club or organization: Yes    Attends meetings of clubs or organizations: More than 4 times per year    Relationship status: Widowed  Other Topics Concern  . Not on file  Social History Narrative  . Not on file    Outpatient Encounter Medications as of 07/07/2018  Medication Sig  . allopurinol (ZYLOPRIM) 300 MG tablet TAKE 1 TABLET EVERY DAY  . amLODipine (NORVASC) 5 MG tablet TAKE 1 TABLET BY MOUTH EVERY DAY  . aspirin 81 MG EC tablet Take 81 mg by mouth daily.    . benzonatate (TESSALON) 200 MG capsule TAKE 1 CAPSULE BY MOUTH 2 TIMES DAILY AS NEEDED FOR COUGH.  . brimonidine (ALPHAGAN) 0.15 % ophthalmic solution 1 drop.  Marland Kitchen CALCIUM PO Take by mouth daily.  . carvedilol (COREG) 25 MG tablet TAKE 1 TABLET BY MOUTH TWICE A DAY WITH A MEAL  . Cholecalciferol (VITAMIN D3) 2000 UNITS capsule Take 2,000 Units by mouth daily.    . cyanocobalamin 2000 MCG tablet Take 2,000 mcg by mouth daily.  . diclofenac sodium (VOLTAREN) 1 % GEL Apply 2 g topically 2 (two) times daily.  . diphenhydramine-acetaminophen (TYLENOL PM) 25-500 MG TABS 1 tablet at bedtime as needed.  . dorzolamide-timolol (COSOPT) 22.3-6.8 MG/ML ophthalmic solution 1 drop.  Marland Kitchen gabapentin (NEURONTIN) 100 MG capsule Take 1 capsule (100 mg total) by mouth 3 (three) times daily.  Marland Kitchen latanoprost (XALATAN) 0.005 % ophthalmic solution Place 1 drop into both eyes 2 (two) times daily.  Marland Kitchen losartan (COZAAR) 50 MG tablet TAKE 1 TABLET BY MOUTH EVERY DAY  . metFORMIN (GLUCOPHAGE) 500 MG tablet TAKE 1 TABLET (500 MG TOTAL) BY MOUTH DAILY WITH BREAKFAST. KEEP SCHEDULE APPT FOR FUTURE REFILL  . NON FORMULARY CBD oil  . pantoprazole (PROTONIX) 40 MG tablet TAKE 1 TABLET BY MOUTH EVERY DAY  . sertraline (ZOLOFT) 100 MG  tablet TAKE 1 TABLET BY MOUTH EVERY DAY  . timolol (TIMOPTIC) 0.25 % ophthalmic solution   . torsemide (DEMADEX) 20 MG tablet TAKE 1/2 TABLET (10MG ) BY MOUTH DAILY AS NEEDED FOR EDEMA (SWELLING)  .  valACYclovir (VALTREX) 500 MG tablet Take 1 tablet (500 mg total) by mouth 2 (two) times daily. Use prn x 5 days  . [DISCONTINUED] cephALEXin (KEFLEX) 500 MG capsule Take 1 capsule (500 mg total) by mouth 2 (two) times daily. (Patient not taking: Reported on 07/07/2018)   No facility-administered encounter medications on file as of 07/07/2018.     Activities of Daily Living In your present state of health, do you have any difficulty performing the following activities: 07/07/2018  Hearing? N  Vision? N  Difficulty concentrating or making decisions? N  Walking or climbing stairs? N  Dressing or bathing? N  Doing errands, shopping? N  Preparing Food and eating ? N  Using the Toilet? N  In the past six months, have you accidently leaked urine? N  Do you have problems with loss of bowel control? N  Managing your Medications? N  Managing your Finances? Y  Housekeeping or managing your Housekeeping? N  Some recent data might be hidden    Patient Care Team: Plotnikov, Evie Lacks, MD as PCP - General (Internal Medicine) Carolan Clines, MD as Consulting Physician (Urology) Melina Schools, MD as Consulting Physician (Orthopedic Surgery)    Assessment:   This is a routine wellness examination for Salaya. Physical assessment deferred to PCP.   Exercise Activities and Dietary recommendations Current Exercise Habits: The patient does not participate in regular exercise at present(chair exercise print-outs provided)  Diet (meal preparation, eat out, water intake, caffeinated beverages, dairy products, fruits and vegetables): in general, a "healthy" diet  , well balanced, Has Meals on Wheels.  Reviewed heart healthy and diabetic diet, Encouraged patient to increase daily water and fluid  intake.  Goals      Patient Stated   . divsion for the blind (pt-stated)     Will contact the Div for the blind to assist the patient in independent living with vision loss Counties Served: [place], [place], [place], [place], [place], [place], [place], [place], [place], [place], [place], [place], [place], [place], [place], [name] Phone: [phone] Toll Free: [phone] Fax: [phone] Email: [email or web address] Physical Address [address] [place], [place] [address]     . transportation (pt-stated)     Will explore transportation in [place] / for [employer]: [employer]:  [phone]  / transaid      Other   . Patient Stated     Continue to be socially engaged with family and friends. Enjoy life.       Fall Risk Fall Risk  07/07/2018 07/04/2017 06/07/2017 06/04/2016 05/30/2015  Falls in the past year? Yes No No No Yes  Comment - - - - stumbled over oxygen tube  Number falls in past yr: 1 - - - 1  Injury with Fall? No - - - No  Risk for fall due to : Impaired mobility;Impaired balance/gait - - - Impaired balance/gait;Impaired vision  Follow up Education provided;Falls prevention discussed - - - Falls evaluation completed    Depression Screen PHQ 2/9 Scores 07/07/2018 07/04/2017 06/07/2017 06/04/2016  PHQ - 2 Score 0 0 0 0  Exception Documentation - - - -     Cognitive Function MMSE - Mini Mental State Exam 07/07/2018 07/04/2017 05/30/2015  Not completed: - - Unable to complete  Orientation to time 5 5 -  Orientation to Place 5 5 -  Registration 3 3 -  Attention/ Calculation 4 4 -  Recall 1 2 -  Language- name 2 objects 2 2 -  Language- repeat  1 1 -  Language- follow 3 step command 3 3 -  Language- read & follow direction 1 1 -  Write a sentence 1 1 -  Copy design 1 1 -  Total score 27 28 -        Immunization History  Administered Date(s) Administered  . H1N1 01/06/2009  . Influenza Split 10/01/2011,  09/08/2012  . Influenza Whole 10/07/2009, 09/29/2010  . Influenza, High Dose Seasonal PF 10/06/2013, 11/21/2016, 09/25/2017  . Influenza-Unspecified 10/09/2014, 09/09/2015  . Pneumococcal Conjugate-13 04/07/2014  . Pneumococcal Polysaccharide-23 06/26/2007, 03/14/2018  . Td 06/26/2007  . Tdap 05/13/2018  . Zoster 10/04/2011   Screening Tests Health Maintenance  Topic Date Due  . FOOT EXAM  06/07/2018  . INFLUENZA VACCINE  07/24/2018  . HEMOGLOBIN A1C  10/04/2018  . OPHTHALMOLOGY EXAM  02/25/2019  . TETANUS/TDAP  05/13/2028  . DEXA SCAN  Completed  . PNA vac Low Risk Adult  Completed       Plan:    Podiatry referral ordered per patient's request to [facility] to address foot issues, patient is diabetic with neuropathy.  Continue doing brain stimulating activities (puzzles, reading, adult coloring books, staying active) to keep memory sharp.   Continue to eat heart healthy diet (full of fruits, vegetables, whole grains, lean protein, water--limit salt, fat, and sugar intake) and increase physical activity as tolerated.  I have personally reviewed and noted the following in the patient's chart:   . Medical and social history . Use of alcohol, tobacco or illicit drugs  . Current medications and supplements . Functional ability and status . Nutritional status . Physical activity . Advanced directives . List of other physicians . Vitals . Screenings to include cognitive, depression, and falls . Referrals and appointments  In addition, I have reviewed and discussed with patient certain preventive protocols, quality metrics, and best practice recommendations. A written personalized care plan for preventive services as well as general preventive health recommendations were provided to patient.     Michiel Cowboy, RN  07/07/2018

## 2018-07-22 ENCOUNTER — Encounter: Payer: Self-pay | Admitting: Sports Medicine

## 2018-07-22 ENCOUNTER — Ambulatory Visit: Payer: Medicare Other | Admitting: Sports Medicine

## 2018-07-22 DIAGNOSIS — E119 Type 2 diabetes mellitus without complications: Secondary | ICD-10-CM | POA: Diagnosis not present

## 2018-07-22 DIAGNOSIS — M79674 Pain in right toe(s): Secondary | ICD-10-CM | POA: Diagnosis not present

## 2018-07-22 DIAGNOSIS — M79675 Pain in left toe(s): Secondary | ICD-10-CM

## 2018-07-22 DIAGNOSIS — B351 Tinea unguium: Secondary | ICD-10-CM | POA: Diagnosis not present

## 2018-07-22 NOTE — Progress Notes (Signed)
Subjective: Breanna [name] is a 84 y.o. female patient with history of diabetes who presents to office today complaining of long,mildly painful nails  while ambulating in shoes; unable to trim. Patient states that  She also wants diabetic shoes has not had them in a long time and admits that they help her feet. Reports the glucose reading this morning was not recorded. Last A1c 6.4. Diagnosed with Diabetes almost 30 years ago. No other pedal complaints.  Review of Systems  Musculoskeletal: Positive for joint pain.  All other systems reviewed and are negative.   Patient Active Problem List   Diagnosis Date Noted  . Well adult exam 05/13/2018  . Herpes labialis 05/13/2018  . Night sweats 04/04/2018  . Breast symptom 10/30/2017  . Diarrhea 08/21/2017  . UTI (urinary tract infection) 08/21/2017  . Lung nodule 08/21/2017  . FTT (failure to thrive) in adult 08/21/2017  . Shortness of breath 08/13/2017  . Cough 08/13/2017  . Fatigue 08/13/2017  . Fever 08/13/2017  . Blurry vision 08/13/2017  . Shoulder pain, right 07/25/2017  . Callus of foot 06/07/2017  . Need for prophylactic vaccination and inoculation against influenza 11/21/2016  . Trigger finger, acquired 11/12/2016  . Meteorism 07/11/2016  . Dark stools 01/10/2016  . Osteoporosis 07/13/2015  . Fall at home 01/13/2015  . Cornea disorder 11/22/2014  . Primary open angle glaucoma of both eyes, indeterminate stage 11/22/2014  . Secondary corneal edema 10/14/2014  . Swelling of left knee joint 04/07/2014  . Grief 01/04/2014  . Syncope 01/04/2014  . Thoracic spine pain 10/22/2013  . Hypernatremia 07/07/2013  . Viral intestinal infection 03/25/2013  . Dehydration, moderate 03/24/2013  . Food poisoning 03/24/2013  . Headache(784.0) 12/22/2012  . URI (upper respiratory infection) 10/21/2012  . Diabetic neuropathy ([place]) 07/30/2012  . Neck pain on right side 07/30/2012  . Cerumen impaction 07/17/2012  . Cystoid macular edema  06/19/2012  . History of corneal transplant 06/19/2012  . Weight loss 04/10/2012  . Wrist pain, right 04/06/2011  . PARESTHESIA 01/03/2011  . FULL INCONTINENCE OF FECES 01/01/2011  . ABDOMINAL PAIN RIGHT UPPER QUADRANT 01/01/2011  . LEG PAIN, LEFT 10/02/2010  . Cerebral artery occlusion with cerebral infarction ([place]) 03/27/2010  . GERD 03/27/2010  . DIVERTICULOSIS-COLON 03/27/2010  . CHOLELITHIASIS 03/27/2010  . LOW BACK PAIN 09/07/2009  . ANEMIA, IRON DEFICIENCY, HX OF 06/14/2009  . DYSPHAGIA 05/16/2009  . Dysphagia 05/16/2009  . HYPERLIPIDEMIA 11/06/2007  . Type 2 diabetes mellitus with renal manifestations, controlled ([place]) 07/29/2007  . Gout 07/29/2007  . Essential hypertension 07/29/2007  . HYDRONEPHROSIS 07/29/2007  . Osteoarthritis 07/29/2007  . COLONIC POLYPS, HX OF 07/29/2007  . DIVERTICULITIS, HX OF 07/29/2007   Current Outpatient Medications on File Prior to Visit  Medication Sig Dispense Refill  . allopurinol (ZYLOPRIM) 300 MG tablet TAKE 1 TABLET EVERY DAY 90 tablet 3  . amLODipine (NORVASC) 5 MG tablet TAKE 1 TABLET BY MOUTH EVERY DAY 90 tablet 3  . aspirin 81 MG EC tablet Take 81 mg by mouth daily.      . benzonatate (TESSALON) 200 MG capsule TAKE 1 CAPSULE BY MOUTH 2 TIMES DAILY AS NEEDED FOR COUGH. 60 capsule 1  . brimonidine (ALPHAGAN) 0.15 % ophthalmic solution 1 drop.    Marland Kitchen CALCIUM PO Take by mouth daily.    . carvedilol (COREG) 25 MG tablet TAKE 1 TABLET BY MOUTH TWICE A DAY WITH A MEAL 180 tablet 3  . Cholecalciferol (VITAMIN D3) 2000 UNITS capsule Take 2,000 Units  by mouth daily.      . cyanocobalamin 2000 MCG tablet Take 2,000 mcg by mouth daily.    . diclofenac sodium (VOLTAREN) 1 % GEL Apply 2 g topically 2 (two) times daily. 200 g 3  . diphenhydramine-acetaminophen (TYLENOL PM) 25-500 MG TABS 1 tablet at bedtime as needed.    . dorzolamide-timolol (COSOPT) 22.3-6.8 MG/ML ophthalmic solution 1 drop.    Marland Kitchen gabapentin (NEURONTIN) 100 MG capsule Take 1  capsule (100 mg total) by mouth 3 (three) times daily. 270 capsule 1  . latanoprost (XALATAN) 0.005 % ophthalmic solution Place 1 drop into both eyes 2 (two) times daily.    Marland Kitchen losartan (COZAAR) 50 MG tablet TAKE 1 TABLET BY MOUTH EVERY DAY 90 tablet 0  . metFORMIN (GLUCOPHAGE) 500 MG tablet TAKE 1 TABLET (500 MG TOTAL) BY MOUTH DAILY WITH BREAKFAST. KEEP SCHEDULE APPT FOR FUTURE REFILL 90 tablet 0  . NON FORMULARY CBD oil    . pantoprazole (PROTONIX) 40 MG tablet TAKE 1 TABLET BY MOUTH EVERY DAY 90 tablet 1  . sertraline (ZOLOFT) 100 MG tablet TAKE 1 TABLET BY MOUTH EVERY DAY 90 tablet 1  . timolol (TIMOPTIC) 0.25 % ophthalmic solution     . torsemide (DEMADEX) 20 MG tablet TAKE 1/2 TABLET (10MG) BY MOUTH DAILY AS NEEDED FOR EDEMA (SWELLING) 90 tablet 2  . valACYclovir (VALTREX) 500 MG tablet Take 1 tablet (500 mg total) by mouth 2 (two) times daily. Use prn x 5 days 20 tablet 1   No current facility-administered medications on file prior to visit.    Allergies  Allergen Reactions  . Aspirin Itching    But tolerates low dose  . Atenolol     REACTION: fatigue  . Codeine Itching  . Codeine Sulfate   . Hydrochlorothiazide     REACTION: gout  . Hydrocodone       side effects - hallucinations  . Ibuprofen     Upset stomach w/high doses  . Lisinopril     REACTION: cough  . Onion Other (See Comments)    Dry mouth/ gets sores  . Valsartan Itching  . Verapamil     REACTION: SOB  . Adhesive  [Tape] Rash    Recent Results (from the past 2160 hour(s))  Reticulocytes     Status: None   Collection Time: 05/15/18 11:59 AM  Result Value Ref Range   Retic Ct Pct 1.3 0.7 - 2.1 %   RBC. 3.92 3.70 - 5.45 MIL/uL   Retic Count, Absolute 51.0 33.7 - 90.7 K/uL    Comment: Performed at [facility], [place] [address], [place], [place] [address]  CBC with Differential/Platelet     Status: Abnormal   Collection Time: 05/15/18 11:59 AM  Result Value Ref Range   WBC 6.3 3.9  - 10.3 K/uL   RBC 3.92 3.70 - 5.45 MIL/uL   Hemoglobin 10.6 (L) 11.6 - 15.9 g/dL   HCT 33.8 (L) 34.8 - 46.6 %   MCV 86.2 79.5 - 101.0 fL   MCH 27.0 25.1 - 34.0 pg   MCHC 31.4 (L) 31.5 - 36.0 g/dL   RDW 16.8 (H) 11.2 - 14.5 %   Platelets 199 145 - 400 K/uL   Neutrophils Relative % 66 %   Neutro Abs 4.1 1.5 - 6.5 K/uL   Lymphocytes Relative 23 %   Lymphs Abs 1.4 0.9 - 3.3 K/uL   Monocytes Relative 7 %   Monocytes Absolute 0.5 0.1 - 0.9 K/uL  Eosinophils Relative 4 %   Eosinophils Absolute 0.3 0.0 - 0.5 K/uL   Basophils Relative 0 %   Basophils Absolute 0.0 0.0 - 0.1 K/uL    Comment: Performed at [facility], [place] [address], [place], [place] [address]  Lactate dehydrogenase     Status: None   Collection Time: 05/15/18 11:59 AM  Result Value Ref Range   LDH 179 125 - 245 U/L    Comment: Performed at [facility], [place] [address] [place], [place] [address]  Haptoglobin     Status: Abnormal   Collection Time: 05/15/18 11:59 AM  Result Value Ref Range   Haptoglobin 260 (H) 34 - 200 mg/dL    Comment: (NOTE) Performed At: [facility] [place], [place] [id number] Rush Farmer MD [id number] Performed at [facility], [place] [address], [place], [place] [address]   Sedimentation rate     Status: Abnormal   Collection Time: 05/15/18 11:59 AM  Result Value Ref Range   Sed Rate 62 (H) 0 - 22 mm/hr    Comment: Performed at [facility], [place] [address] [place], [place] [address]  Ferritin     Status: None   Collection Time: 05/15/18 11:59 AM  Result Value Ref Range   Ferritin 136 9 - 269 ng/mL    Comment: Performed at [facility], [place] [address] [place], [place] [address]  Iron and TIBC     Status: Abnormal   Collection Time: 05/15/18 11:59 AM  Result Value Ref Range   Iron 45 41 - 142 ug/dL   TIBC 286 236 - 444 ug/dL   Saturation  Ratios 16 (L) 21 - 57 %   UIBC 241 ug/dL    Comment: Performed at [facility], [place] [address], [place], [place] [address]  Erythropoietin     Status: None   Collection Time: 05/15/18 11:59 AM  Result Value Ref Range   Erythropoietin 11.9 2.6 - 18.5 mIU/mL    Comment: (NOTE) [employer] UniCel DxI 800 Immunoassay System Values obtained with different assay methods or kits cannot be used interchangeably. Results cannot be interpreted as absolute evidence of the presence or absence of malignant disease. Performed At: [facility] [place], [place] [id number] Rush Farmer MD [id number] Performed at [facility], [place] [address], [place], [place] [address]   BCR ABL1 FISH Philis Fendt)     Status: None   Collection Time: 05/15/18 11:59 AM  Result Value Ref Range   BCR ABL1 / ABL1 See Scanned report in [place]: Performed at [facility], [place] [address], [place], [place] [address]  Multiple Myeloma Panel (SPEP&IFE w/QIG)     Status: Abnormal   Collection Time: 05/15/18 11:59 AM  Result Value Ref Range   IgG (Immunoglobin G), Serum 1,529 700 - 1,600 mg/dL   IgA 656 (H) 64 - 422 mg/dL   IgM (Immunoglobulin M), Srm 80 26 - 217 mg/dL   Total Protein ELP 7.3 6.0 - 8.5 g/dL   Albumin SerPl Elph-Mcnc 3.2 2.9 - 4.4 g/dL   Alpha 1 0.3 0.0 - 0.4 g/dL   Alpha2 Glob SerPl Elph-Mcnc 1.0 0.4 - 1.0 g/dL   B-Globulin SerPl Elph-Mcnc 1.2 0.7 - 1.3 g/dL   Gamma Glob SerPl Elph-Mcnc 1.6 0.4 - 1.8 g/dL   M Protein SerPl Elph-Mcnc Not Observed Not  Observed g/dL   Globulin, Total 4.1 (H) 2.2 - 3.9 g/dL   Albumin/Glob SerPl 0.8 0.7 - 1.7   IFE 1 Comment     Comment: (NOTE) An apparent polyclonal gammopathy: IgA. Kappa and lambda typing appear increased.    Please Note Comment     Comment: (NOTE) Protein electrophoresis scan will follow via computer, mail, or courier  delivery. Performed At: [facility] [place], [place] [id number] Rush Farmer MD [id number] Performed at [facility], [place] [address], [place], [place] [address]   Kappa/lambda light chains     Status: Abnormal   Collection Time: 05/15/18 11:59 AM  Result Value Ref Range   Kappa free light chain 107.0 (H) 3.3 - 19.4 mg/L   Lamda free light chains 74.4 (H) 5.7 - 26.3 mg/L   Kappa, lamda light chain ratio 1.44 0.26 - 1.65    Comment: (NOTE) Performed At: [facility] [place], [place] [id number] Rush Farmer MD [id number] Performed at [facility], [place] [address] [place], [place] [address]   Vitamin B12     Status: Abnormal   Collection Time: 05/15/18 11:59 AM  Result Value Ref Range   Vitamin B-12 5,638 (H) 180 - 914 pg/mL    Comment: (NOTE) This assay is not validated for testing neonatal or myeloproliferative syndrome specimens for Vitamin B12 levels. Performed at [facility], [place] [address], [place], [place] [address]   Save smear     Status: None   Collection Time: 05/15/18 11:59 AM  Result Value Ref Range   Smear Review SMEAR STAINED AND AVAILABLE FOR REVIEW     Comment: Performed at [facility], [address] [place] [address], [place], [place] [address]  Urinalysis, Complete w Microscopic     Status: Abnormal   Collection Time: 05/15/18 12:00 PM  Result Value Ref Range   Color, Urine YELLOW YELLOW   APPearance CLEAR CLEAR   Specific Gravity, Urine 1.010 1.005 - 1.030   pH 7.0 5.0 - 8.0   Glucose, UA NEGATIVE NEGATIVE mg/dL   Hgb urine dipstick NEGATIVE NEGATIVE   Bilirubin Urine NEGATIVE NEGATIVE   Ketones, ur NEGATIVE NEGATIVE mg/dL   Protein, ur 100 (A) NEGATIVE mg/dL   Nitrite NEGATIVE NEGATIVE   Leukocytes, UA TRACE (A) NEGATIVE   RBC / HPF 0-5 0 - 5 RBC/hpf   WBC, UA 11-20 0 - 5 WBC/hpf   Bacteria, UA RARE (A) NONE SEEN   Squamous  Epithelial / LPF 0-5 0 - 5   Mucus PRESENT     Comment: Performed at [facility], [place] [address], [place], [place] [address]  Culture, Urine     Status: Abnormal   Collection Time: 05/15/18 12:01 PM  Result Value Ref Range   Specimen Description      URINE, CLEAN CATCH Performed at [facility], [place] [address] [place], [place] [address]    Special Requests      NONE Performed at [facility], [place] [address], [place], [place] [address]    Culture 70,000 COLONIES/mL ESCHERICHIA COLI (A)    Report Status 05/17/2018 FINAL    Organism ID, Bacteria ESCHERICHIA COLI (A)       Susceptibility   Escherichia coli - MIC*    AMPICILLIN <=2 SENSITIVE Sensitive     CEFAZOLIN <=4 SENSITIVE Sensitive     CEFTRIAXONE <=1 SENSITIVE Sensitive     CIPROFLOXACIN <=0.25  SENSITIVE Sensitive     GENTAMICIN <=1 SENSITIVE Sensitive     IMIPENEM <=0.25 SENSITIVE Sensitive     NITROFURANTOIN <=16 SENSITIVE Sensitive     TRIMETH/SULFA <=20 SENSITIVE Sensitive     AMPICILLIN/SULBACTAM <=2 SENSITIVE Sensitive     PIP/TAZO <=4 SENSITIVE Sensitive     Extended ESBL NEGATIVE Sensitive     * 70,000 COLONIES/mL ESCHERICHIA COLI    Objective: General: Patient is awake, alert, and oriented x 3 and in no acute distress.  Integument: Skin is warm, dry and supple bilateral. Nails are tender, long, thickened and dystrophic with subungual debris, consistent with onychomycosis, 1-5 on left and 1,3-5 on right. No signs of infection. Minimal callus at distal tuft at 3rd toes bilateral. Remaining integument unremarkable.  Vasculature:  Dorsalis Pedis pulse 1/4 bilateral. Posterior Tibial pulse  1/4 bilateral. Capillary fill time <5 sec 1-5 bilateral. Scant hair growth to the level of the digits. Temperature gradient within normal limits. + varicosities present bilateral. No edema present bilateral.   Neurology: The patient has diminished sensation  measured with a 5.07/10g Semmes Weinstein Monofilament at all pedal sites bilateral . Vibratory sensation diminished bilateral with tuning fork. No Babinski sign present bilateral.   Musculoskeletal: Asymptomatic hammertoe and pes planus pedal deformities noted bilateral. Muscular strength 5/5 in all lower extremity muscular groups bilateral without pain on range of motion. No tenderness with calf compression bilateral.  Assessment and Plan: Problem List Items Addressed This Visit    None    Visit Diagnoses    Encounter for comprehensive diabetic foot examination, type 2 diabetes mellitus ([place])    -  Primary   Pain due to onychomycosis of toenails of both feet          -Examined patient. -Discussed and educated patient on diabetic foot care, especially with  regards to the vascular, neurological and musculoskeletal systems.  -Stressed the importance of good glycemic control and the detriment of not  controlling glucose levels in relation to the foot. -Mechanically debrided all nails 1-5 bilateral using sterile nail nipper and filed with dremel without incident  -[employer] step diabetic shoe order form was completed; office to contact primary care for approval / certification;  Office to arrange shoe fitting and dispensing. -Answered all patient questions -Patient to return  in 3 months for at risk foot care and for diabetic shoe measurements when called -Patient advised to call the office if any problems or questions arise in the meantime.  Landis Martins, DPM

## 2018-07-24 ENCOUNTER — Ambulatory Visit: Payer: Medicare Other | Admitting: Orthotics

## 2018-07-24 DIAGNOSIS — M2041 Other hammer toe(s) (acquired), right foot: Secondary | ICD-10-CM

## 2018-07-24 DIAGNOSIS — E119 Type 2 diabetes mellitus without complications: Secondary | ICD-10-CM

## 2018-07-24 DIAGNOSIS — M2142 Flat foot [pes planus] (acquired), left foot: Secondary | ICD-10-CM

## 2018-07-24 DIAGNOSIS — M2042 Other hammer toe(s) (acquired), left foot: Secondary | ICD-10-CM

## 2018-07-24 DIAGNOSIS — M2141 Flat foot [pes planus] (acquired), right foot: Secondary | ICD-10-CM

## 2018-07-24 NOTE — Progress Notes (Signed)
Patient presents today for diabetic shoe measurement and foam casting.  Goals of diabetic shoes/inserts to offer protection from conditions secondary to DM2, offer relief from sheer forces that could lead to ulcerations, protect the foot, and offer greater stability. Patient is under supervision of DPM Roby Physician managing patients DM2: [place] Patient has following documented conditions to qualify for diabetic shoes/inserts: Patient measured with brannock device: 23M  Patient chose ortho [phone] 23M

## 2018-08-13 ENCOUNTER — Ambulatory Visit: Payer: Medicare Other | Admitting: Internal Medicine

## 2018-08-13 ENCOUNTER — Encounter: Payer: Self-pay | Admitting: Internal Medicine

## 2018-08-13 ENCOUNTER — Other Ambulatory Visit (INDEPENDENT_AMBULATORY_CARE_PROVIDER_SITE_OTHER): Payer: Medicare Other

## 2018-08-13 DIAGNOSIS — R634 Abnormal weight loss: Secondary | ICD-10-CM | POA: Diagnosis not present

## 2018-08-13 DIAGNOSIS — Z862 Personal history of diseases of the blood and blood-forming organs and certain disorders involving the immune mechanism: Secondary | ICD-10-CM

## 2018-08-13 DIAGNOSIS — E1121 Type 2 diabetes mellitus with diabetic nephropathy: Secondary | ICD-10-CM

## 2018-08-13 DIAGNOSIS — R5383 Other fatigue: Secondary | ICD-10-CM

## 2018-08-13 DIAGNOSIS — I10 Essential (primary) hypertension: Secondary | ICD-10-CM | POA: Diagnosis not present

## 2018-08-13 LAB — CBC WITH DIFFERENTIAL/PLATELET
Basophils Absolute: 0 10*3/uL (ref 0.0–0.1)
Basophils Relative: 0.4 % (ref 0.0–3.0)
Eosinophils Absolute: 0.2 10*3/uL (ref 0.0–0.7)
Eosinophils Relative: 4.9 % (ref 0.0–5.0)
HCT: 34.3 % — ABNORMAL LOW (ref 36.0–46.0)
Hemoglobin: 11 g/dL — ABNORMAL LOW (ref 12.0–15.0)
Lymphocytes Relative: 22 % (ref 12.0–46.0)
Lymphs Abs: 1.1 10*3/uL (ref 0.7–4.0)
MCHC: 31.9 g/dL (ref 30.0–36.0)
MCV: 86 fl (ref 78.0–100.0)
Monocytes Absolute: 0.5 10*3/uL (ref 0.1–1.0)
Monocytes Relative: 9.6 % (ref 3.0–12.0)
Neutro Abs: 3.2 10*3/uL (ref 1.4–7.7)
Neutrophils Relative %: 63.1 % (ref 43.0–77.0)
Platelets: 133 10*3/uL — ABNORMAL LOW (ref 150.0–400.0)
RBC: 3.99 Mil/uL (ref 3.87–5.11)
RDW: 19.2 % — ABNORMAL HIGH (ref 11.5–15.5)
WBC: 5 10*3/uL (ref 4.0–10.5)

## 2018-08-13 LAB — BASIC METABOLIC PANEL
BUN: 34 mg/dL — ABNORMAL HIGH (ref 6–23)
CO2: 28 mEq/L (ref 19–32)
Calcium: 9.9 mg/dL (ref 8.4–10.5)
Chloride: 103 mEq/L (ref 96–112)
Creatinine, Ser: 1.27 mg/dL — ABNORMAL HIGH (ref 0.40–1.20)
GFR: 51.44 mL/min — ABNORMAL LOW (ref >60.00)
Glucose, Bld: 107 mg/dL — ABNORMAL HIGH (ref 70–99)
Potassium: 4.8 mEq/L (ref 3.5–5.1)
Sodium: 139 mEq/L (ref 135–145)

## 2018-08-13 LAB — HEMOGLOBIN A1C: Hgb A1c MFr Bld: 6.4 % (ref 4.6–6.5)

## 2018-08-13 NOTE — Assessment & Plan Note (Addendum)
CBC Hem eval 6/19

## 2018-08-13 NOTE — Assessment & Plan Note (Signed)
Metformin Labs 

## 2018-08-13 NOTE — Assessment & Plan Note (Signed)
Chronic Labs

## 2018-08-13 NOTE — Progress Notes (Signed)
Subjective:  Patient ID: Breanna [name], female    DOB: 11-06-1931  Age: 84 y.o. MRN: [id number]  CC: No chief complaint on file.   HPI JACQUES WILLINGHAM presents for OA, LBP, DM f/u  Outpatient Medications Prior to Visit  Medication Sig Dispense Refill  . allopurinol (ZYLOPRIM) 300 MG tablet TAKE 1 TABLET EVERY DAY 90 tablet 3  . amLODipine (NORVASC) 5 MG tablet TAKE 1 TABLET BY MOUTH EVERY DAY 90 tablet 3  . aspirin 81 MG EC tablet Take 81 mg by mouth daily.      . benzonatate (TESSALON) 200 MG capsule TAKE 1 CAPSULE BY MOUTH 2 TIMES DAILY AS NEEDED FOR COUGH. 60 capsule 1  . brimonidine (ALPHAGAN) 0.15 % ophthalmic solution 1 drop.    Marland Kitchen CALCIUM PO Take by mouth daily.    . carvedilol (COREG) 25 MG tablet TAKE 1 TABLET BY MOUTH TWICE A DAY WITH A MEAL 180 tablet 3  . Cholecalciferol (VITAMIN D3) 2000 UNITS capsule Take 2,000 Units by mouth daily.      . cyanocobalamin 2000 MCG tablet Take 2,000 mcg by mouth daily.    . diclofenac sodium (VOLTAREN) 1 % GEL Apply 2 g topically 2 (two) times daily. 200 g 3  . diphenhydramine-acetaminophen (TYLENOL PM) 25-500 MG TABS 1 tablet at bedtime as needed.    . dorzolamide-timolol (COSOPT) 22.3-6.8 MG/ML ophthalmic solution 1 drop.    Marland Kitchen gabapentin (NEURONTIN) 100 MG capsule Take 1 capsule (100 mg total) by mouth 3 (three) times daily. 270 capsule 1  . latanoprost (XALATAN) 0.005 % ophthalmic solution Place 1 drop into both eyes 2 (two) times daily.    Marland Kitchen losartan (COZAAR) 50 MG tablet TAKE 1 TABLET BY MOUTH EVERY DAY 90 tablet 0  . metFORMIN (GLUCOPHAGE) 500 MG tablet TAKE 1 TABLET (500 MG TOTAL) BY MOUTH DAILY WITH BREAKFAST. KEEP SCHEDULE APPT FOR FUTURE REFILL 90 tablet 0  . NON FORMULARY CBD oil    . pantoprazole (PROTONIX) 40 MG tablet TAKE 1 TABLET BY MOUTH EVERY DAY 90 tablet 1  . sertraline (ZOLOFT) 100 MG tablet TAKE 1 TABLET BY MOUTH EVERY DAY 90 tablet 1  . timolol (TIMOPTIC) 0.25 % ophthalmic solution     . torsemide (DEMADEX) 20 MG tablet  TAKE 1/2 TABLET (10MG ) BY MOUTH DAILY AS NEEDED FOR EDEMA (SWELLING) 90 tablet 2  . valACYclovir (VALTREX) 500 MG tablet Take 1 tablet (500 mg total) by mouth 2 (two) times daily. Use prn x 5 days 20 tablet 1   No facility-administered medications prior to visit.     ROS: Review of Systems  Constitutional: Positive for fatigue. Negative for activity change, appetite change, chills and unexpected weight change.  HENT: Negative for congestion, mouth sores and sinus pressure.   Eyes: Negative for visual disturbance.  Respiratory: Negative for cough and chest tightness.   Gastrointestinal: Negative for abdominal pain and nausea.  Genitourinary: Negative for difficulty urinating, frequency and vaginal pain.  Musculoskeletal: Positive for arthralgias, back pain and gait problem.  Skin: Negative for pallor and rash.  Neurological: Negative for dizziness, tremors, weakness, numbness and headaches.  Psychiatric/Behavioral: Negative for confusion, sleep disturbance and suicidal ideas. The patient is nervous/anxious.     Objective:  BP 140/76 (BP Location: Left Arm, Patient Position: Sitting, Cuff Size: Normal)   Pulse (!) 52   Temp 97.6 F (36.4 C) (Oral)   Ht 5\' 2"  (1.575 m)   Wt 143 lb (64.9 kg)   SpO2 96%  BMI 26.16 kg/m   BP Readings from Last 3 Encounters:  08/13/18 140/76  07/07/18 (!) 143/64  05/27/18 (!) 170/69    Wt Readings from Last 3 Encounters:  08/13/18 143 lb (64.9 kg)  07/07/18 145 lb (65.8 kg)  05/27/18 146 lb 8 oz (66.5 kg)    Physical Exam  Constitutional: She appears well-developed. No distress.  HENT:  Head: Normocephalic.  Right Ear: External ear normal.  Left Ear: External ear normal.  Nose: Nose normal.  Mouth/Throat: Oropharynx is clear and moist.  Eyes: Pupils are equal, round, and reactive to light. Conjunctivae are normal. Right eye exhibits no discharge. Left eye exhibits no discharge.  Neck: Normal range of motion. Neck supple. No JVD present.  No tracheal deviation present. No thyromegaly present.  Cardiovascular: Normal rate, regular rhythm and normal heart sounds.  Pulmonary/Chest: No stridor. No respiratory distress. She has no wheezes.  Abdominal: Soft. Bowel sounds are normal. She exhibits no distension and no mass. There is no tenderness. There is no rebound and no guarding.  Musculoskeletal: She exhibits tenderness. She exhibits no edema.  Lymphadenopathy:    She has no cervical adenopathy.  Neurological: She displays normal reflexes. No cranial nerve deficit. She exhibits normal muscle tone. Coordination abnormal.  Skin: No rash noted. No erythema.  Psychiatric: She has a normal mood and affect. Her behavior is normal. Judgment and thought content normal.  cane LS tender  Lab Results  Component Value Date   WBC 6.3 05/15/2018   HGB 10.6 (L) 05/15/2018   HCT 33.8 (L) 05/15/2018   PLT 199 05/15/2018   GLUCOSE 109 (H) 04/04/2018   CHOL 154 10/19/2014   TRIG 56.0 10/19/2014   HDL 61.10 10/19/2014   LDLCALC 82 10/19/2014   ALT 9 08/13/2017   AST 15 08/13/2017   NA 143 04/04/2018   K 3.8 04/04/2018   CL 106 04/04/2018   CREATININE 1.02 04/04/2018   BUN 19 04/04/2018   CO2 27 04/04/2018   TSH 1.16 04/04/2018   HGBA1C 6.4 04/04/2018    Mm Screening Breast Tomo Bilateral  Result Date: 04/30/2018 CLINICAL DATA:  Screening. EXAM: DIGITAL SCREENING BILATERAL MAMMOGRAM WITH TOMO AND CAD COMPARISON:  Previous exam(s). ACR Breast Density Category b: There are scattered areas of fibroglandular density. FINDINGS: There are no findings suspicious for malignancy. Images were processed with CAD. IMPRESSION: No mammographic evidence of malignancy. A result letter of this screening mammogram will be mailed directly to the patient. RECOMMENDATION: Screening mammogram in one year. (Code:SM-B-01Y) BI-RADS CATEGORY  1: Negative. Electronically Signed   By: Curlene Dolphin M.D.   On: 04/30/2018 16:17    Assessment & Plan:   There are  no diagnoses linked to this encounter.   No orders of the defined types were placed in this encounter.    Follow-up: No follow-ups on file.  Walker Kehr, MD

## 2018-08-13 NOTE — Assessment & Plan Note (Signed)
Wt Readings from Last 3 Encounters:  08/13/18 143 lb (64.9 kg)  07/07/18 145 lb (65.8 kg)  05/27/18 146 lb 8 oz (66.5 kg)

## 2018-08-13 NOTE — Assessment & Plan Note (Signed)
Coreg, Amlodipine 

## 2018-09-02 ENCOUNTER — Ambulatory Visit: Payer: Medicare Other | Admitting: Orthotics

## 2018-09-02 DIAGNOSIS — M2142 Flat foot [pes planus] (acquired), left foot: Secondary | ICD-10-CM

## 2018-09-02 DIAGNOSIS — M2042 Other hammer toe(s) (acquired), left foot: Principal | ICD-10-CM

## 2018-09-02 DIAGNOSIS — M2141 Flat foot [pes planus] (acquired), right foot: Secondary | ICD-10-CM

## 2018-09-02 DIAGNOSIS — M2041 Other hammer toe(s) (acquired), right foot: Secondary | ICD-10-CM

## 2018-09-02 NOTE — Progress Notes (Signed)
Patient had appointment today for definitive and final diabetic shoe fitting and delivery.  Patient was seen by Betha, C.Ped, OHI.   The inserts fit well and accomplished full contact with the plantar surface of the foot bilateral; the shoes fit well and offered forefoot freedom, no noticible heel slippage, and good toe clearance w/ the insert in place.  Patient was advised to monitor of any skin irritation, breakdown.  Patient was satisfied with fit and function. 

## 2018-09-14 ENCOUNTER — Other Ambulatory Visit: Payer: Self-pay | Admitting: Internal Medicine

## 2018-09-22 ENCOUNTER — Ambulatory Visit: Payer: Self-pay | Admitting: Internal Medicine

## 2018-09-22 ENCOUNTER — Ambulatory Visit: Payer: Medicare Other | Admitting: Family

## 2018-09-22 ENCOUNTER — Encounter: Payer: Self-pay | Admitting: Family

## 2018-09-22 ENCOUNTER — Ambulatory Visit (INDEPENDENT_AMBULATORY_CARE_PROVIDER_SITE_OTHER)
Admission: RE | Admit: 2018-09-22 | Discharge: 2018-09-22 | Disposition: A | Discharge status: Home / Self Care | Payer: Medicare Other | Source: Ambulatory Visit | Attending: Family | Admitting: Family

## 2018-09-22 VITALS — BP 160/70 | HR 54 | Temp 97.6°F | Ht 62.0 in | Wt 145.1 lb

## 2018-09-22 DIAGNOSIS — M25561 Pain in right knee: Secondary | ICD-10-CM | POA: Diagnosis not present

## 2018-09-22 DIAGNOSIS — Z23 Encounter for immunization: Secondary | ICD-10-CM

## 2018-09-22 NOTE — Progress Notes (Signed)
Breanna [name] is a 84 y.o. female with the following history as recorded in [employer]:  Patient Active Problem List   Diagnosis Date Noted  . Well adult exam 05/13/2018  . Herpes labialis 05/13/2018  . Night sweats 04/04/2018  . Breast symptom 10/30/2017  . Diarrhea 08/21/2017  . UTI (urinary tract infection) 08/21/2017  . Lung nodule 08/21/2017  . FTT (failure to thrive) in adult 08/21/2017  . Shortness of breath 08/13/2017  . Cough 08/13/2017  . Fatigue 08/13/2017  . Fever 08/13/2017  . Blurry vision 08/13/2017  . Shoulder pain, right 07/25/2017  . Callus of foot 06/07/2017  . Need for prophylactic vaccination and inoculation against influenza 11/21/2016  . Trigger finger, acquired 11/12/2016  . Meteorism 07/11/2016  . Dark stools 01/10/2016  . Osteoporosis 07/13/2015  . Fall at home 01/13/2015  . Cornea disorder 11/22/2014  . Primary open angle glaucoma of both eyes, indeterminate stage 11/22/2014  . Secondary corneal edema 10/14/2014  . Swelling of left knee joint 04/07/2014  . Grief 01/04/2014  . Syncope 01/04/2014  . Thoracic spine pain 10/22/2013  . Hypernatremia 07/07/2013  . Viral intestinal infection 03/25/2013  . Dehydration, moderate 03/24/2013  . Food poisoning 03/24/2013  . Headache(784.0) 12/22/2012  . URI (upper respiratory infection) 10/21/2012  . Diabetic neuropathy ([place]) 07/30/2012  . Neck pain on right side 07/30/2012  . Cerumen impaction 07/17/2012  . Cystoid macular edema 06/19/2012  . History of corneal transplant 06/19/2012  . Weight loss 04/10/2012  . Wrist pain, right 04/06/2011  . PARESTHESIA 01/03/2011  . FULL INCONTINENCE OF FECES 01/01/2011  . ABDOMINAL PAIN RIGHT UPPER QUADRANT 01/01/2011  . LEG PAIN, LEFT 10/02/2010  . Cerebral artery occlusion with cerebral infarction ([place]) 03/27/2010  . GERD 03/27/2010  . DIVERTICULOSIS-COLON 03/27/2010  . CHOLELITHIASIS 03/27/2010  . LOW BACK PAIN 09/07/2009  . ANEMIA, IRON DEFICIENCY, HX OF  06/14/2009  . DYSPHAGIA 05/16/2009  . Dysphagia 05/16/2009  . HYPERLIPIDEMIA 11/06/2007  . Type 2 diabetes mellitus with renal manifestations, controlled ([place]) 07/29/2007  . Gout 07/29/2007  . Essential hypertension 07/29/2007  . HYDRONEPHROSIS 07/29/2007  . Osteoarthritis 07/29/2007  . COLONIC POLYPS, HX OF 07/29/2007  . DIVERTICULITIS, HX OF 07/29/2007    Current Outpatient Medications  Medication Sig Dispense Refill  . allopurinol (ZYLOPRIM) 300 MG tablet TAKE 1 TABLET BY MOUTH EVERY DAY 90 tablet 3  . amLODipine (NORVASC) 5 MG tablet TAKE 1 TABLET BY MOUTH EVERY DAY 90 tablet 3  . aspirin 81 MG EC tablet Take 81 mg by mouth daily.      . benzonatate (TESSALON) 200 MG capsule TAKE 1 CAPSULE BY MOUTH 2 TIMES DAILY AS NEEDED FOR COUGH. 60 capsule 1  . brimonidine (ALPHAGAN) 0.15 % ophthalmic solution 1 drop.    Marland Kitchen CALCIUM PO Take by mouth daily.    . carvedilol (COREG) 25 MG tablet TAKE 1 TABLET BY MOUTH TWICE A DAY WITH A MEAL 180 tablet 3  . Cholecalciferol (VITAMIN D3) 2000 UNITS capsule Take 2,000 Units by mouth daily.      . cyanocobalamin 2000 MCG tablet Take 2,000 mcg by mouth daily.    . diclofenac sodium (VOLTAREN) 1 % GEL Apply 2 g topically 2 (two) times daily. 200 g 3  . diphenhydramine-acetaminophen (TYLENOL PM) 25-500 MG TABS 1 tablet at bedtime as needed.    . dorzolamide-timolol (COSOPT) 22.3-6.8 MG/ML ophthalmic solution 1 drop.    Marland Kitchen gabapentin (NEURONTIN) 100 MG capsule Take 1 capsule (100 mg total) by mouth 3 (  three) times daily. 270 capsule 1  . latanoprost (XALATAN) 0.005 % ophthalmic solution Place 1 drop into both eyes 2 (two) times daily.    Marland Kitchen losartan (COZAAR) 50 MG tablet TAKE 1 TABLET BY MOUTH EVERY DAY 90 tablet 3  . metFORMIN (GLUCOPHAGE) 500 MG tablet Take 1 tablet (500 mg total) by mouth daily with breakfast. 90 tablet 3  . NON FORMULARY CBD oil    . pantoprazole (PROTONIX) 40 MG tablet TAKE 1 TABLET BY MOUTH EVERY DAY 90 tablet 1  . sertraline (ZOLOFT)  100 MG tablet TAKE 1 TABLET BY MOUTH EVERY DAY 90 tablet 1  . timolol (TIMOPTIC) 0.25 % ophthalmic solution     . torsemide (DEMADEX) 20 MG tablet TAKE 1/2 TABLET (10MG ) BY MOUTH DAILY AS NEEDED FOR EDEMA (SWELLING) 90 tablet 2   No current facility-administered medications for this visit.     Allergies: Aspirin; Atenolol; Codeine; Codeine sulfate; Hydrochlorothiazide; Hydrocodone; Ibuprofen; Lisinopril; Onion; Valsartan; Verapamil; and Adhesive  [tape]  Past Medical History:  Diagnosis Date  . Adrenal adenoma 2006  . Boils 2009  . Cholelithiasis    asympt. w/normal HIDA 03/2010  . CVA (cerebral infarction) 2010   Cerebellar  . Diverticulitis   . Esophageal spasm 2011  . GERD (gastroesophageal reflux disease)   . Gout   . History of colon polyps   . HTN (hypertension)   . Hydronephrosis    LEFT/ Surgical intervention  . Hyperlipidemia   . LBP (low back pain)   . Osteoarthritis   . Pulmonary HTN ([place])   . Stress   . Type II or unspecified type diabetes mellitus without mention of complication, not stated as uncontrolled     Past Surgical History:  Procedure Laterality Date  . ABDOMINAL HYSTERECTOMY    . BACK SURGERY    . BREAST BIOPSY     RIGHT  . CATARACT EXTRACTION, BILATERAL    . COLECTOMY  2006   Sigmoid  . FOOT SURGERY     BILATERAL  . HAMMER TOE SURGERY    . ROTATOR CUFF REPAIR  2008   RIGHT  . TOTAL KNEE ARTHROPLASTY  2003   LEFT  . VARICOSE VEIN SURGERY     vein stripping/lower extremities    Family History  Problem Relation Age of Onset  . Prostate cancer Father   . Hypertension Father   . Cancer Father        prostate  . Heart disease Mother   . Diabetes Mother   . Diabetes Other        1st degree relative  . Heart disease Other   . Hypertension Other   . Prostate cancer Maternal Uncle   . Cancer Maternal Uncle        prostate  . Breast cancer Daughter   . Cancer Daughter        breast  . Colon cancer Neg Hx     Social History   Tobacco  Use  . Smoking status: Never Smoker  . Smokeless tobacco: Never Used  Substance Use Topics  . Alcohol use: No    Alcohol/week: 0.0 standard drinks    Subjective:  1 week history of right knee pain/ swelling; no known injury or trauma; gout has been well controlled for many years; history of knee replacement left knee;  Also mentions swelling in her right lower foot- has Rx for Torsemide but does not want to take with her knee pain;   Objective:  Vitals:   09/22/18 1517  BP: (!) 160/70  Pulse: (!) 54  Temp: 97.6 F (36.4 C)  TempSrc: Oral  SpO2: 97%  Weight: 145 lb 1.3 oz (65.8 kg)  Height: 5\' 2"  (1.575 m)    General: Well developed, well nourished, in no acute distress  Skin : Warm and dry.  Head: Normocephalic and atraumatic  Eyes: Sclera and conjunctiva clear; pupils round and reactive to light; extraocular movements intact  Ears: External normal; canals clear; tympanic membranes normal  Oropharynx: Pink, supple. No suspicious lesions  Neck: Supple without thyromegaly, adenopathy  Lungs: Respirations unlabored; Musculoskeletal: No deformities; swelling noted over right medial knee Extremities: +pitting  Edema right lower foot/ no swelling in right calf, no cyanosis, no clubbing  Vessels: Symmetric bilaterally  Neurologic: Alert and oriented; speech intact; face symmetrical; moves all extremities well; CNII-XII intact without focal deficit   Assessment:  1. Acute pain of right knee   2. Need for influenza vaccination     Plan:  1. Update X-ray; she notes she has OTC Aleve or Advil at home- will take for symptoms; plan to return with sports medicine. 2. Flu shot given.    No follow-ups on file.  Orders Placed This Encounter  Procedures  . DG Knee Complete 4 Views Right    Standing Status:   Future    Number of Occurrences:   1    Standing Expiration Date:   11/23/2019    Order Specific Question:   Reason for Exam (SYMPTOM  OR DIAGNOSIS REQUIRED)    Answer:   knee  pain    Order Specific Question:   Preferred imaging location?    Answer:   [facility]    Order Specific Question:   Radiology Contrast Protocol - do NOT remove file path    Answer:   [email or web address]  . Flu vaccine HIGH DOSE PF    Requested Prescriptions    No prescriptions requested or ordered in this encounter

## 2018-09-22 NOTE — Telephone Encounter (Signed)
Patient is calling to reports he has pain and swelling in her Right knee- she as adds she has swelling in her calve and ankle. Swelling is not relieved by elevation per patient report. She is visually impaired so she can not tell if the area is red- but she does report heat in her knee. Appointment made. Reason for Disposition . [1] Redness AND [2] painful when touched AND [3] no fever  Answer Assessment - Initial Assessment Questions 1. LOCATION: "Where is the swelling located?"  (e.g., left, right, both knees)     Right knee 2. SIZE and DESCRIPTION: "What does the swelling look like?"  (e.g., entire knee, localized)     Inside front of knee 3. ONSET: "When did the swelling start?" "Does it come and go, or is it there all the time?"     Swelling started 1 week ago, constantly swollen 4. PAIN: "Is there any pain?" If so, ask: "How bad is it?" (Scale 1-10; or mild, moderate, severe)     Pain- 7 5. SETTING: "Has there been any recent work, exercise or other activity that involved that part of the body?"      No activity where knee was injured 6. AGGRAVATING FACTORS: "What makes the knee swelling worse?" (e.g., walking, climbing stairs, running)     No- same all the time 7. ASSOCIATED SYMPTOMS: "Is there any pain or redness?"     Knee is warm to touch- patient is visually impaired 8. OTHER SYMPTOMS: "Do you have any other symptoms?" (e.g., chest pain, difficulty breathing, fever, calf pain)     No 9. PREGNANCY: "Is there any chance you are pregnant?" "When was your last menstrual period?"     n/a  Protocols used: [employer]

## 2018-09-25 NOTE — Progress Notes (Signed)
Breanna [name] - 84 y.o. female MRN [id number]  Date of birth: 09-Jan-1931  SUBJECTIVE:  Including CC & ROS.  Chief Complaint  Patient presents with  . Knee Pain    Breanna [name] is a 84 y.o. female that is experiencing right knee pain. States that the knee has been swelling. Had swelling down to her foot. Walking makes the pain worse. Has never had a knee injection and does not want one. Has xrays. Medial. Pain radiates up the back and down the leg. Has numbness and tingling. Knee feels weak.    Independent review of the right knee x-rays from 9/30 show significant degenerative change in all 3 compartments.  Review of Systems  Constitutional: Negative for fever.  HENT: Negative for congestion.   Respiratory: Negative for cough.   Cardiovascular: Negative for chest pain.  Gastrointestinal: Negative for abdominal pain.  Musculoskeletal: Positive for arthralgias.  Skin: Negative for color change.  Neurological: Negative for weakness.  Hematological: Negative for adenopathy.  Psychiatric/Behavioral: Negative for agitation.    HISTORY: Past Medical, Surgical, Social, and Family History Reviewed & Updated per EMR.   Pertinent Historical Findings include:  Past Medical History:  Diagnosis Date  . Adrenal adenoma 2006  . Boils 2009  . Cholelithiasis    asympt. w/normal HIDA 03/2010  . CVA (cerebral infarction) 2010   Cerebellar  . Diverticulitis   . Esophageal spasm 2011  . GERD (gastroesophageal reflux disease)   . Gout   . History of colon polyps   . HTN (hypertension)   . Hydronephrosis    LEFT/ Surgical intervention  . Hyperlipidemia   . LBP (low back pain)   . Osteoarthritis   . Pulmonary HTN ([place])   . Stress   . Type II or unspecified type diabetes mellitus without mention of complication, not stated as uncontrolled     Past Surgical History:  Procedure Laterality Date  . ABDOMINAL HYSTERECTOMY    . BACK SURGERY    . BREAST BIOPSY     RIGHT  . CATARACT EXTRACTION,  BILATERAL    . COLECTOMY  2006   Sigmoid  . FOOT SURGERY     BILATERAL  . HAMMER TOE SURGERY    . ROTATOR CUFF REPAIR  2008   RIGHT  . TOTAL KNEE ARTHROPLASTY  2003   LEFT  . VARICOSE VEIN SURGERY     vein stripping/lower extremities    Allergies  Allergen Reactions  . Aspirin Itching    But tolerates low dose  . Atenolol     REACTION: fatigue  . Codeine Itching  . Codeine Sulfate   . Hydrochlorothiazide     REACTION: gout  . Hydrocodone       side effects - hallucinations  . Ibuprofen     Upset stomach w/high doses  . Lisinopril     REACTION: cough  . Onion Other (See Comments)    Dry mouth/ gets sores  . Valsartan Itching  . Verapamil     REACTION: SOB  . Adhesive  [Tape] Rash    Family History  Problem Relation Age of Onset  . Prostate cancer Father   . Hypertension Father   . Cancer Father        prostate  . Heart disease Mother   . Diabetes Mother   . Diabetes Other        1st degree relative  . Heart disease Other   . Hypertension Other   . Prostate cancer Maternal Uncle   .  Cancer Maternal Uncle        prostate  . Breast cancer Daughter   . Cancer Daughter        breast  . Colon cancer Neg Hx      Social History   Socioeconomic History  . Marital status: Widowed    Spouse name: Pretty Weltman  . Number of children: Not on file  . Years of education: Not on file  . Highest education level: Not on file  Occupational History  . Occupation: Retired    Fish farm manager: RETIRED  Social Needs  . Financial resource strain: Not hard at all  . Food insecurity:    Worry: Never true    Inability: Never true  . Transportation needs:    Medical: No    Non-medical: No  Tobacco Use  . Smoking status: Never Smoker  . Smokeless tobacco: Never Used  Substance and Sexual Activity  . Alcohol use: No    Alcohol/week: 0.0 standard drinks  . Drug use: No  . Sexual activity: Not Currently  Lifestyle  . Physical activity:    Days per week: 0 days     Minutes per session: 0 min  . Stress: Not at all  Relationships  . Social connections:    Talks on phone: More than three times a week    Gets together: More than three times a week    Attends religious service: More than 4 times per year    Active member of club or organization: Yes    Attends meetings of clubs or organizations: More than 4 times per year    Relationship status: Widowed  . Intimate partner violence:    Fear of current or ex partner: Not on file    Emotionally abused: Not on file    Physically abused: Not on file    Forced sexual activity: Not on file  Other Topics Concern  . Not on file  Social History Narrative  . Not on file     PHYSICAL EXAM:  VS: BP (!) 154/90 (BP Location: Left Arm, Patient Position: Sitting)   Pulse (!) 52   Ht 5\' 2"  (1.575 m)   Wt 143 lb (64.9 kg)   SpO2 96%   BMI 26.16 kg/m  Physical Exam Gen: NAD, alert, cooperative with exam, well-appearing ENT: normal lips, normal nasal mucosa,  Eye: normal EOM, normal conjunctiva and lids CV:  no edema, +2 pedal pulses   Resp: no accessory muscle use, non-labored,  Skin: no rashes, no areas of induration  Neuro: normal tone, normal sensation to touch Psych:  normal insight, alert and oriented MSK:  Right knee:  No obvious effusion  TTP along medial joint line  Normal ROM  Normal strength  No pain with patellar grind  Negative McMurray's test  Neurovascularly intact      ASSESSMENT & PLAN:   Acute pain of right knee Pain likely related to degenerative changes.  - counseled on HEP  - placed in hinged knee brace  - counseled on supportive care  - if no improvement would consider injection  - Pennsaid samples provided.

## 2018-09-26 ENCOUNTER — Ambulatory Visit: Payer: Medicare Other | Admitting: Family Medicine

## 2018-09-26 ENCOUNTER — Encounter: Payer: Self-pay | Admitting: Family Medicine

## 2018-09-26 VITALS — BP 154/90 | HR 52 | Ht 62.0 in | Wt 143.0 lb

## 2018-09-26 DIAGNOSIS — M25561 Pain in right knee: Secondary | ICD-10-CM | POA: Diagnosis not present

## 2018-09-26 NOTE — Patient Instructions (Signed)
Nice to meet you  Please try the brace and icing  Take tylenol 650 mg three times a day is the best evidence based medicine we have for arthritis.  Glucosamine sulfate 750mg  twice a day is a supplement that has been shown to help moderate to severe arthritis. Vitamin D 2000 IU daily Fish oil 2 grams daily.  Tumeric 500mg  twice daily.  Capsaicin topically up to four times a day may also help with pain. Cortisone injections are an option if these interventions do not seem to make a difference or need more relief.  Please see me back in 2-3 weeks if your symptoms don't improve

## 2018-09-28 DIAGNOSIS — M25561 Pain in right knee: Secondary | ICD-10-CM | POA: Insufficient documentation

## 2018-09-28 NOTE — Assessment & Plan Note (Addendum)
Pain likely related to degenerative changes.  - counseled on HEP  - placed in hinged knee brace  - counseled on supportive care  - if no improvement would consider injection  - Pennsaid samples provided.

## 2018-09-30 ENCOUNTER — Ambulatory Visit: Payer: Medicare Other | Admitting: Orthotics

## 2018-09-30 DIAGNOSIS — M2042 Other hammer toe(s) (acquired), left foot: Secondary | ICD-10-CM

## 2018-09-30 DIAGNOSIS — M2041 Other hammer toe(s) (acquired), right foot: Secondary | ICD-10-CM

## 2018-09-30 DIAGNOSIS — M2142 Flat foot [pes planus] (acquired), left foot: Secondary | ICD-10-CM

## 2018-09-30 DIAGNOSIS — E119 Type 2 diabetes mellitus without complications: Secondary | ICD-10-CM

## 2018-09-30 DIAGNOSIS — M2141 Flat foot [pes planus] (acquired), right foot: Secondary | ICD-10-CM

## 2018-10-09 NOTE — Progress Notes (Signed)
Breanna [name] - 84 y.o. female MRN [id number]  Date of birth: 14-Dec-1931  SUBJECTIVE:  Including CC & ROS.  No chief complaint on file.   Breanna [name] is a 84 y.o. female that is following up for right knee pain.  She reports a mild improvement.  She has been using the pen said with some improvement.  Denies any mechanical symptoms.  The pain is localized to the knee.  Pain is most substantial in the medial compartment.  Has been using a cane with ambulation..  Independent review of the right knee x-ray from 9/30 shows moderate tricompartmental degenerative changes.   Review of Systems  Constitutional: Negative for fever.  HENT: Negative for congestion.   Respiratory: Negative for cough.   Cardiovascular: Negative for chest pain.  Gastrointestinal: Negative for abdominal pain.  Musculoskeletal: Positive for arthralgias and gait problem.  Skin: Negative for color change.  Neurological: Negative for weakness.  Hematological: Negative for adenopathy.  Psychiatric/Behavioral: Negative for agitation.    HISTORY: Past Medical, Surgical, Social, and Family History Reviewed & Updated per EMR.   Pertinent Historical Findings include:  Past Medical History:  Diagnosis Date  . Adrenal adenoma 2006  . Boils 2009  . Cholelithiasis    asympt. w/normal HIDA 03/2010  . CVA (cerebral infarction) 2010   Cerebellar  . Diverticulitis   . Esophageal spasm 2011  . GERD (gastroesophageal reflux disease)   . Gout   . History of colon polyps   . HTN (hypertension)   . Hydronephrosis    LEFT/ Surgical intervention  . Hyperlipidemia   . LBP (low back pain)   . Osteoarthritis   . Pulmonary HTN ([place])   . Stress   . Type II or unspecified type diabetes mellitus without mention of complication, not stated as uncontrolled     Past Surgical History:  Procedure Laterality Date  . ABDOMINAL HYSTERECTOMY    . BACK SURGERY    . BREAST BIOPSY     RIGHT  . CATARACT EXTRACTION, BILATERAL    . COLECTOMY   2006   Sigmoid  . FOOT SURGERY     BILATERAL  . HAMMER TOE SURGERY    . ROTATOR CUFF REPAIR  2008   RIGHT  . TOTAL KNEE ARTHROPLASTY  2003   LEFT  . VARICOSE VEIN SURGERY     vein stripping/lower extremities    Allergies  Allergen Reactions  . Aspirin Itching    But tolerates low dose  . Atenolol     REACTION: fatigue  . Codeine Itching  . Codeine Sulfate   . Hydrochlorothiazide     REACTION: gout  . Hydrocodone       side effects - hallucinations  . Ibuprofen     Upset stomach w/high doses  . Lisinopril     REACTION: cough  . Onion Other (See Comments)    Dry mouth/ gets sores  . Valsartan Itching  . Verapamil     REACTION: SOB  . Adhesive  [Tape] Rash    Family History  Problem Relation Age of Onset  . Prostate cancer Father   . Hypertension Father   . Cancer Father        prostate  . Heart disease Mother   . Diabetes Mother   . Diabetes Other        1st degree relative  . Heart disease Other   . Hypertension Other   . Prostate cancer Maternal Uncle   . Cancer Maternal Uncle  prostate  . Breast cancer Daughter   . Cancer Daughter        breast  . Colon cancer Neg Hx      Social History   Socioeconomic History  . Marital status: Widowed    Spouse name: Rissie Sculley  . Number of children: Not on file  . Years of education: Not on file  . Highest education level: Not on file  Occupational History  . Occupation: Retired    Fish farm manager: RETIRED  Social Needs  . Financial resource strain: Not hard at all  . Food insecurity:    Worry: Never true    Inability: Never true  . Transportation needs:    Medical: No    Non-medical: No  Tobacco Use  . Smoking status: Never Smoker  . Smokeless tobacco: Never Used  Substance and Sexual Activity  . Alcohol use: No    Alcohol/week: 0.0 standard drinks  . Drug use: No  . Sexual activity: Not Currently  Lifestyle  . Physical activity:    Days per week: 0 days    Minutes per session: 0 min  .  Stress: Not at all  Relationships  . Social connections:    Talks on phone: More than three times a week    Gets together: More than three times a week    Attends religious service: More than 4 times per year    Active member of club or organization: Yes    Attends meetings of clubs or organizations: More than 4 times per year    Relationship status: Widowed  . Intimate partner violence:    Fear of current or ex partner: Not on file    Emotionally abused: Not on file    Physically abused: Not on file    Forced sexual activity: Not on file  Other Topics Concern  . Not on file  Social History Narrative  . Not on file     PHYSICAL EXAM:  VS: BP 136/72   Pulse (!) 52   Resp 16   Wt 143 lb (64.9 kg)   SpO2 96%   BMI 26.16 kg/m  Physical Exam Gen: NAD, alert, cooperative with exam, well-appearing ENT: normal lips, normal nasal mucosa,  Eye: normal EOM, normal conjunctiva and lids CV:  no edema, +2 pedal pulses   Resp: no accessory muscle use, non-labored,  Skin: no rashes, no areas of induration  Neuro: normal tone, normal sensation to touch Psych:  normal insight, alert and oriented MSK:  Right knee: Mild effusion. No tenderness palpation of the quad or patellar tendon. Tenderness palpation of the medial joint line. Normal range of motion. Normal strength resistance. Negative McMurray's test. No pain with patellar grind. Ambulating with cane. Neurovascular intact     ASSESSMENT & PLAN:   Acute pain of right knee Pain likely result of degenerative changes.  She wants to avoid injections at this time. -Samples of Pennsaid provided. -Counseled supportive care. -If no improvement consider injections.

## 2018-10-10 ENCOUNTER — Ambulatory Visit: Payer: Medicare Other | Admitting: Family Medicine

## 2018-10-10 ENCOUNTER — Encounter: Payer: Self-pay | Admitting: Family Medicine

## 2018-10-10 DIAGNOSIS — M25561 Pain in right knee: Secondary | ICD-10-CM

## 2018-10-10 NOTE — Patient Instructions (Signed)
Good to see you  Please try to use tylenol  Please use ice on the knee  Please try the brace if it flares up  We can give you more pennsaid samples if you run out.

## 2018-10-10 NOTE — Assessment & Plan Note (Signed)
Pain likely result of degenerative changes.  She wants to avoid injections at this time. -Samples of Pennsaid provided. -Counseled supportive care. -If no improvement consider injections.

## 2018-11-09 IMAGING — DX DG KNEE COMPLETE 4+V*R*
4 series · 4 of 4 positions shown · non-contrast
Comparison: None.

CLINICAL DATA: 87-year-old female with right knee pain and swelling
for the past week. No injury. Initial encounter.

EXAM:
RIGHT KNEE - COMPLETE 4+ VIEW

[knee ap]
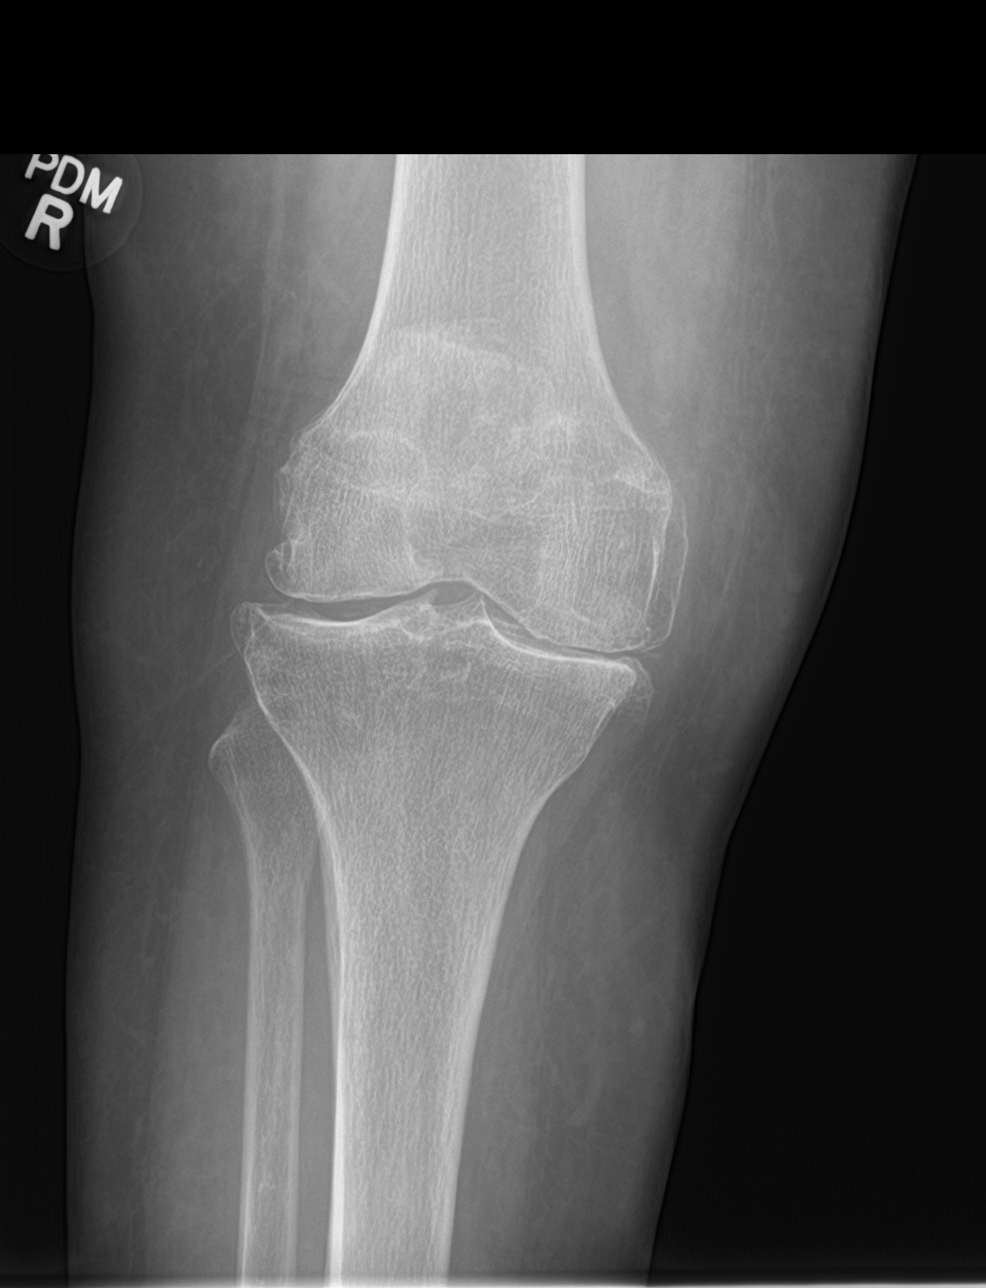

[knee lat]
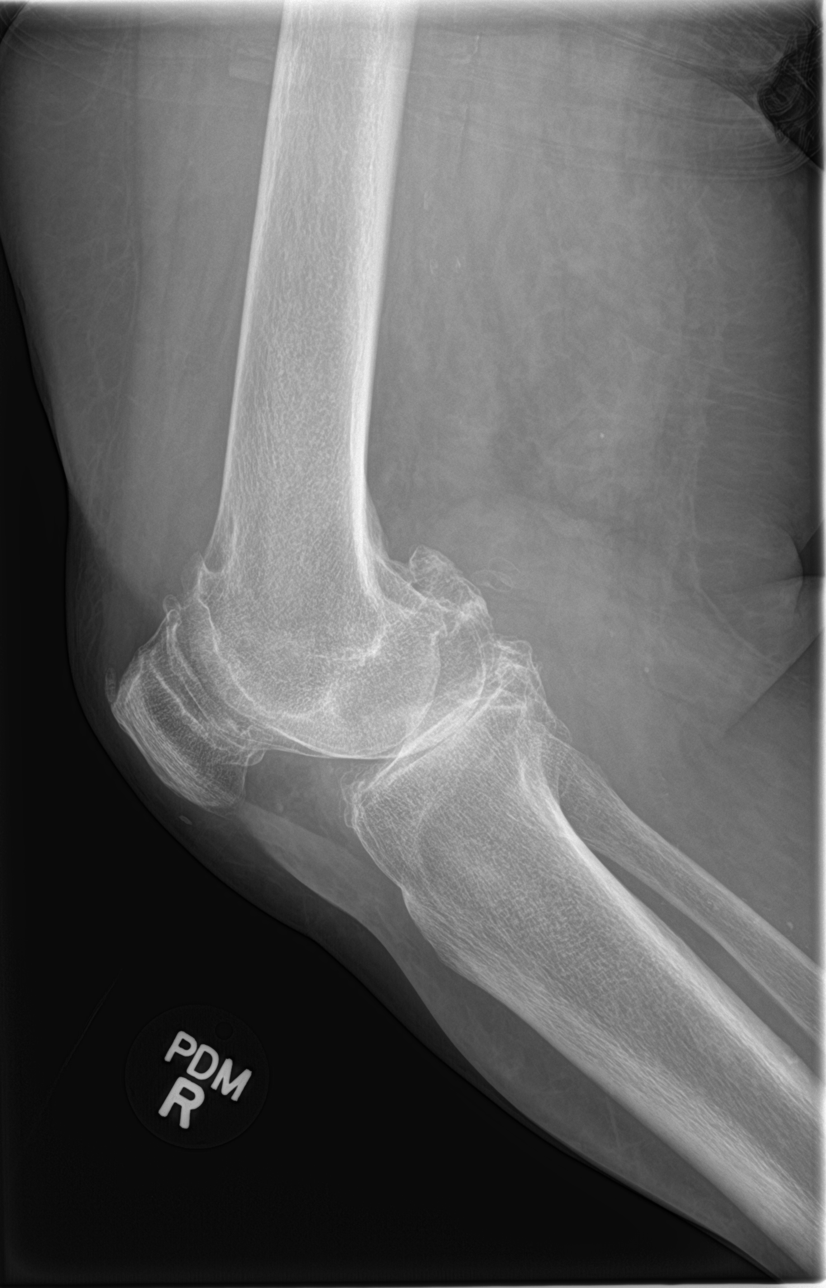

[knee obl (1 of 2)]
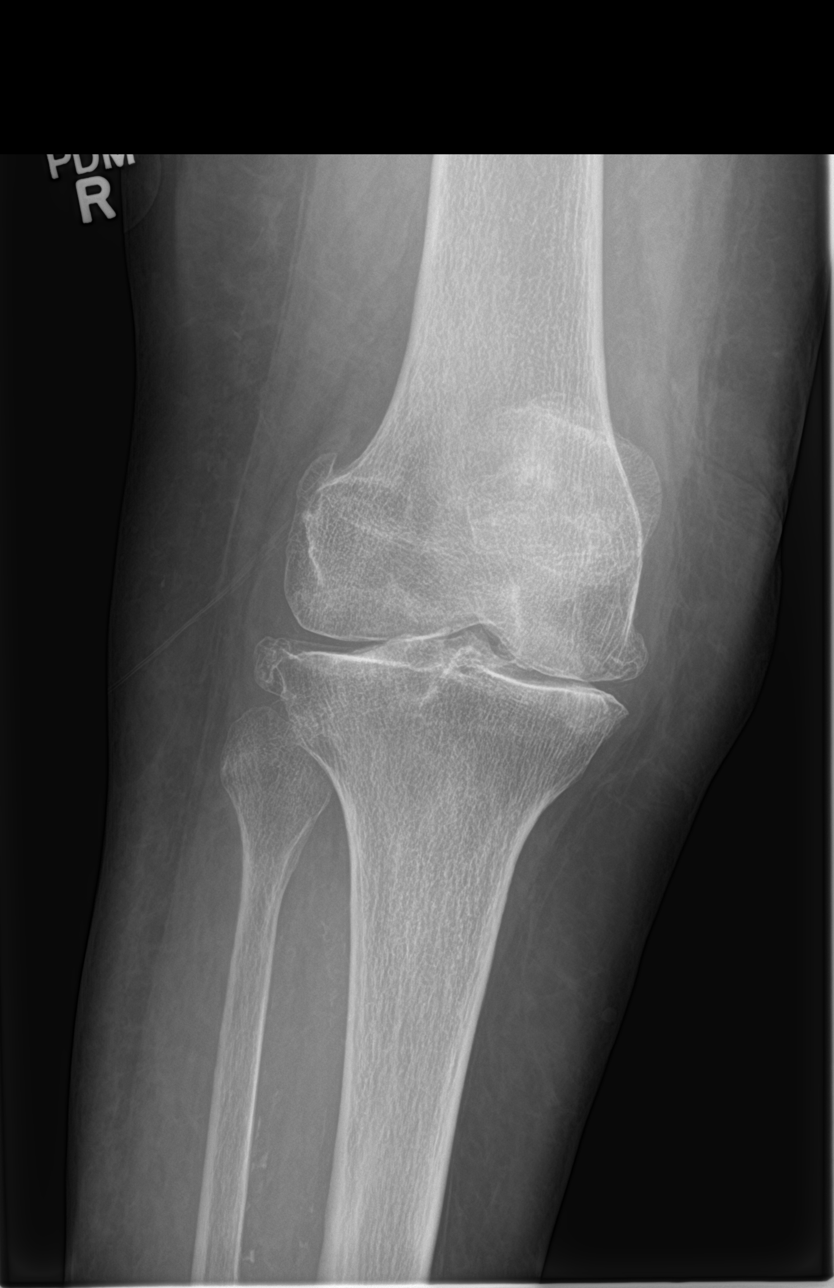

[knee obl (2 of 2)]
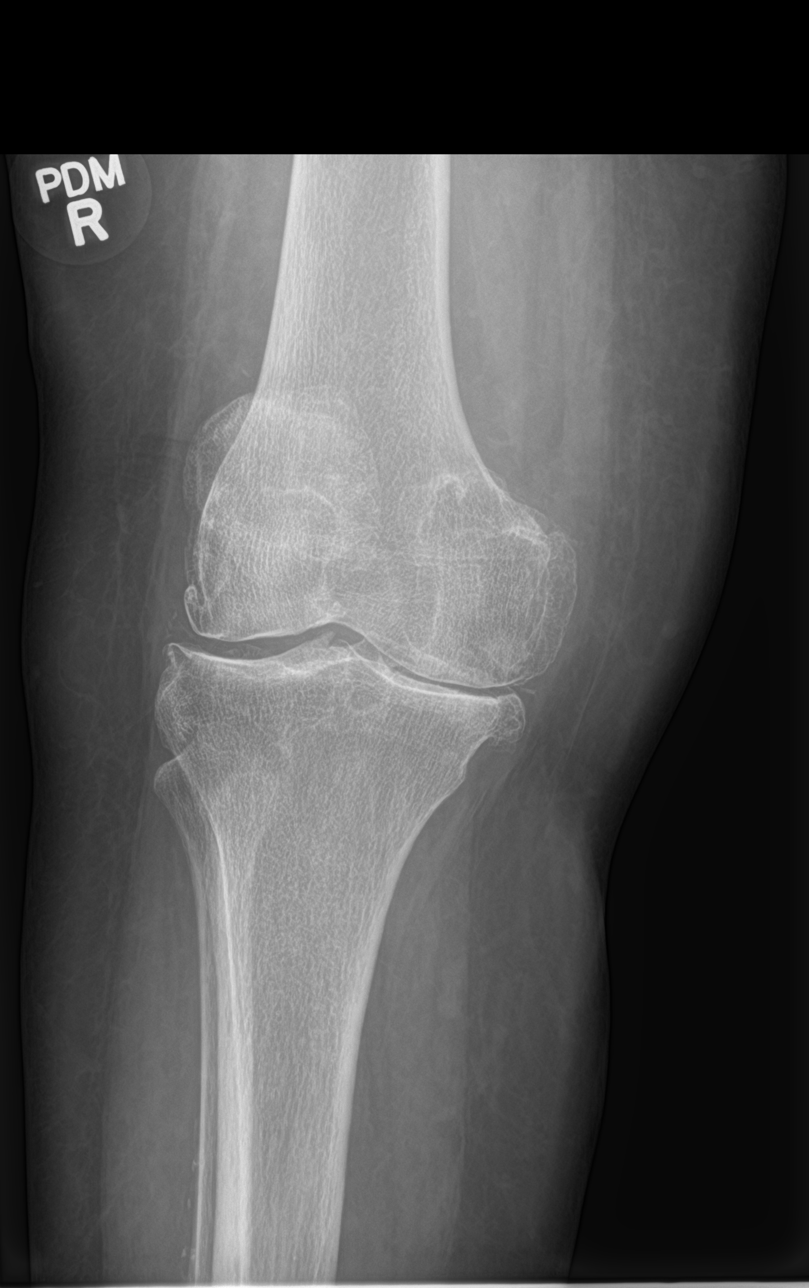

[4 of 4 positions shown; findings below may reference images not displayed]

FINDINGS: Moderate to marked tricompartment degenerative changes most notable
medial tibiofemoral joint space and patellofemoral joint space. No
fracture or dislocation.
IMPRESSION: 1. Moderate to marked tricompartment degenerative changes most
notable medial tibiofemoral joint space and patellofemoral joint
space.
2. No fracture or dislocation.

## 2018-11-13 ENCOUNTER — Ambulatory Visit: Payer: Medicare Other | Admitting: Internal Medicine

## 2018-11-13 ENCOUNTER — Other Ambulatory Visit (INDEPENDENT_AMBULATORY_CARE_PROVIDER_SITE_OTHER): Payer: Medicare Other

## 2018-11-13 ENCOUNTER — Encounter: Payer: Self-pay | Admitting: Internal Medicine

## 2018-11-13 DIAGNOSIS — E1129 Type 2 diabetes mellitus with other diabetic kidney complication: Secondary | ICD-10-CM | POA: Diagnosis not present

## 2018-11-13 DIAGNOSIS — I10 Essential (primary) hypertension: Secondary | ICD-10-CM

## 2018-11-13 DIAGNOSIS — I6350 Cerebral infarction due to unspecified occlusion or stenosis of unspecified cerebral artery: Secondary | ICD-10-CM | POA: Diagnosis not present

## 2018-11-13 DIAGNOSIS — E1121 Type 2 diabetes mellitus with diabetic nephropathy: Secondary | ICD-10-CM | POA: Diagnosis not present

## 2018-11-13 LAB — BASIC METABOLIC PANEL
BUN: 23 mg/dL (ref 6–23)
CO2: 27 mEq/L (ref 19–32)
Calcium: 9.5 mg/dL (ref 8.4–10.5)
Chloride: 106 mEq/L (ref 96–112)
Creatinine, Ser: 1.21 mg/dL — ABNORMAL HIGH (ref 0.40–1.20)
GFR: 54.36 mL/min — ABNORMAL LOW (ref >60.00)
Glucose, Bld: 109 mg/dL — ABNORMAL HIGH (ref 70–99)
Potassium: 4.1 mEq/L (ref 3.5–5.1)
Sodium: 142 mEq/L (ref 135–145)

## 2018-11-13 LAB — HEMOGLOBIN A1C: Hgb A1c MFr Bld: 6.4 % (ref 4.6–6.5)

## 2018-11-13 NOTE — Progress Notes (Signed)
Subjective:  Patient ID: Breanna [name], female    DOB: [birth year]  Age: 84 y.o. MRN: [id number]  CC: No chief complaint on file.   HPI Breanna [name] presents for DM, HTN, gout  Outpatient Medications Prior to Visit  Medication Sig Dispense Refill  . allopurinol (ZYLOPRIM) 300 MG tablet TAKE 1 TABLET BY MOUTH EVERY DAY 90 tablet 3  . amLODipine (NORVASC) 5 MG tablet TAKE 1 TABLET BY MOUTH EVERY DAY 90 tablet 3  . aspirin 81 MG EC tablet Take 81 mg by mouth daily.      . benzonatate (TESSALON) 200 MG capsule TAKE 1 CAPSULE BY MOUTH 2 TIMES DAILY AS NEEDED FOR COUGH. 60 capsule 1  . brimonidine (ALPHAGAN) 0.15 % ophthalmic solution 1 drop.    Marland Kitchen CALCIUM PO Take by mouth daily.    . carvedilol (COREG) 25 MG tablet TAKE 1 TABLET BY MOUTH TWICE A DAY WITH A MEAL 180 tablet 3  . Cholecalciferol (VITAMIN D3) 2000 UNITS capsule Take 2,000 Units by mouth daily.      . cyanocobalamin 2000 MCG tablet Take 2,000 mcg by mouth daily.    . diclofenac sodium (VOLTAREN) 1 % GEL Apply 2 g topically 2 (two) times daily. 200 g 3  . diphenhydramine-acetaminophen (TYLENOL PM) 25-500 MG TABS 1 tablet at bedtime as needed.    . dorzolamide-timolol (COSOPT) 22.3-6.8 MG/ML ophthalmic solution 1 drop.    Marland Kitchen gabapentin (NEURONTIN) 100 MG capsule Take 1 capsule (100 mg total) by mouth 3 (three) times daily. 270 capsule 1  . latanoprost (XALATAN) 0.005 % ophthalmic solution Place 1 drop into both eyes 2 (two) times daily.    Marland Kitchen losartan (COZAAR) 50 MG tablet TAKE 1 TABLET BY MOUTH EVERY DAY 90 tablet 3  . metFORMIN (GLUCOPHAGE) 500 MG tablet Take 1 tablet (500 mg total) by mouth daily with breakfast. 90 tablet 3  . NON FORMULARY CBD oil    . pantoprazole (PROTONIX) 40 MG tablet TAKE 1 TABLET BY MOUTH EVERY DAY 90 tablet 1  . sertraline (ZOLOFT) 100 MG tablet TAKE 1 TABLET BY MOUTH EVERY DAY 90 tablet 1  . timolol (TIMOPTIC) 0.25 % ophthalmic solution     . torsemide (DEMADEX) 20 MG tablet TAKE 1/2 TABLET (10MG ) BY MOUTH  DAILY AS NEEDED FOR EDEMA (SWELLING) 90 tablet 2   No facility-administered medications prior to visit.     ROS: Review of Systems  Constitutional: Negative for activity change, appetite change, chills, fatigue and unexpected weight change.  HENT: Negative for congestion, mouth sores and sinus pressure.   Eyes: Negative for visual disturbance.  Respiratory: Negative for cough and chest tightness.   Gastrointestinal: Negative for abdominal pain and nausea.  Genitourinary: Negative for difficulty urinating, frequency and vaginal pain.  Musculoskeletal: Positive for arthralgias and back pain. Negative for gait problem.  Skin: Negative for pallor and rash.  Neurological: Negative for dizziness, tremors, weakness, numbness and headaches.  Psychiatric/Behavioral: Negative for confusion and sleep disturbance.    Objective:  BP (!) 142/80 (BP Location: Left Arm, Patient Position: Sitting, Cuff Size: Normal)   Pulse (!) 47   Temp (!) 97.5 F (36.4 C) (Oral)   Ht 5\' 2"  (1.575 m)   Wt 145 lb (65.8 kg)   SpO2 97%   BMI 26.52 kg/m   BP Readings from Last 3 Encounters:  11/13/18 (!) 142/80  10/10/18 136/72  09/26/18 (!) 154/90    Wt Readings from Last 3 Encounters:  11/13/18 145 lb (65.8 kg)  10/10/18 143 lb (64.9 kg)  09/26/18 143 lb (64.9 kg)    Physical Exam  Constitutional: She appears well-developed. No distress.  HENT:  Head: Normocephalic.  Right Ear: External ear normal.  Left Ear: External ear normal.  Nose: Nose normal.  Mouth/Throat: Oropharynx is clear and moist.  Eyes: Pupils are equal, round, and reactive to light. Conjunctivae are normal. Right eye exhibits no discharge. Left eye exhibits no discharge.  Neck: Normal range of motion. Neck supple. No JVD present. No tracheal deviation present. No thyromegaly present.  Cardiovascular: Normal rate, regular rhythm and normal heart sounds.  Pulmonary/Chest: No stridor. No respiratory distress. She has no wheezes.    Abdominal: Soft. Bowel sounds are normal. She exhibits no distension and no mass. There is no tenderness. There is no rebound and no guarding.  Musculoskeletal: She exhibits tenderness. She exhibits no edema.  Lymphadenopathy:    She has no cervical adenopathy.  Neurological: She displays normal reflexes. No cranial nerve deficit. She exhibits normal muscle tone. Coordination abnormal.  Skin: No rash noted. No erythema.  Psychiatric: She has a normal mood and affect. Her behavior is normal. Judgment and thought content normal.    Lab Results  Component Value Date   WBC 5.0 08/13/2018   HGB 11.0 (L) 08/13/2018   HCT 34.3 (L) 08/13/2018   PLT 133.0 (L) 08/13/2018   GLUCOSE 107 (H) 08/13/2018   CHOL 154 10/19/2014   TRIG 56.0 10/19/2014   HDL 61.10 10/19/2014   LDLCALC 82 10/19/2014   ALT 9 08/13/2017   AST 15 08/13/2017   NA 139 08/13/2018   K 4.8 08/13/2018   CL 103 08/13/2018   CREATININE 1.27 (H) 08/13/2018   BUN 34 (H) 08/13/2018   CO2 28 08/13/2018   TSH 1.16 04/04/2018   HGBA1C 6.4 08/13/2018    Dg Knee Complete 4 Views Right  Result Date: 09/22/2018 CLINICAL DATA:  84 year old female with right knee pain and swelling for the past week. No injury. Initial encounter. EXAM: RIGHT KNEE - COMPLETE 4+ VIEW COMPARISON:  None. FINDINGS: Moderate to marked tricompartment degenerative changes most notable medial tibiofemoral joint space and patellofemoral joint space. No fracture or dislocation. IMPRESSION: 1. Moderate to marked tricompartment degenerative changes most notable medial tibiofemoral joint space and patellofemoral joint space. 2. No fracture or dislocation. Electronically Signed   By: Genia Del M.D.   On: 09/22/2018 19:54    Assessment & Plan:   There are no diagnoses linked to this encounter.   No orders of the defined types were placed in this encounter.    Follow-up: No follow-ups on file.  Walker Kehr, MD

## 2018-11-13 NOTE — Assessment & Plan Note (Signed)
Remote ASA 81

## 2018-11-13 NOTE — Assessment & Plan Note (Signed)
Coreg, Amlodipine NAS diet

## 2018-11-13 NOTE — Assessment & Plan Note (Signed)
Metformin 

## 2018-11-16 ENCOUNTER — Other Ambulatory Visit: Payer: Self-pay | Admitting: Internal Medicine

## 2018-11-26 ENCOUNTER — Ambulatory Visit: Payer: Self-pay

## 2018-11-26 NOTE — Telephone Encounter (Addendum)
pls take Losartan 50 mg/d if tolerated Thx

## 2018-11-26 NOTE — Telephone Encounter (Signed)
FYI

## 2018-11-26 NOTE — Telephone Encounter (Signed)
Pt called to verify her dosage and frequency of her Losartan. Pt stated since her refill 09/15/18- she has been taking four  25 mg Losartan daily. Per chart review dose is 50 mg daily. Pt has been taking 100 mg total.  Called [facility] on file and pharmacist verified that the correct dosage is 50 mg daily. Pharmacist stated that there was a back log of Losartan 50 mg and pt was ordered 25 mg take 2 daily.  Called pt back and updated what the pharmacist said. Asked pt to get her bottle and read the directions again. Pt stated "take two 25 mg per day." Pt verbalized understanding.  Pt denies any dizziness and could not find her BP monitor. Pt stated her last OV in November that her BP was elevated. Per chart review BP was 142/80 on 11/13/18.   Reason for Disposition . [1] DOUBLE DOSE (an extra dose or lesser amount) of prescription drug AND [2] NO symptoms (Exception: a double dose of antibiotics)  Answer Assessment - Initial Assessment Questions 1. SYMPTOMS: "Do you have any symptoms?"     No symptoms-  2. SEVERITY: If symptoms are present, ask "Are they mild, moderate or severe?"     n/a  Protocols used: MEDICATION QUESTION CALL-A-AH

## 2018-11-27 NOTE — Telephone Encounter (Signed)
RN notified pt.

## 2018-12-11 ENCOUNTER — Other Ambulatory Visit: Payer: Self-pay | Admitting: Internal Medicine

## 2018-12-23 ENCOUNTER — Other Ambulatory Visit: Payer: Self-pay | Admitting: Internal Medicine

## 2018-12-25 ENCOUNTER — Encounter: Payer: Self-pay | Admitting: Internal Medicine

## 2018-12-25 ENCOUNTER — Ambulatory Visit (INDEPENDENT_AMBULATORY_CARE_PROVIDER_SITE_OTHER)
Admission: RE | Admit: 2018-12-25 | Discharge: 2018-12-25 | Disposition: A | Discharge status: Home / Self Care | Payer: Medicare Other | Source: Ambulatory Visit | Attending: Internal Medicine | Admitting: Internal Medicine

## 2018-12-25 ENCOUNTER — Ambulatory Visit: Payer: Medicare Other | Admitting: Internal Medicine

## 2018-12-25 DIAGNOSIS — G8929 Other chronic pain: Secondary | ICD-10-CM

## 2018-12-25 DIAGNOSIS — G44319 Acute post-traumatic headache, not intractable: Secondary | ICD-10-CM | POA: Diagnosis not present

## 2018-12-25 DIAGNOSIS — S0003XA Contusion of scalp, initial encounter: Secondary | ICD-10-CM

## 2018-12-25 DIAGNOSIS — S0093XA Contusion of unspecified part of head, initial encounter: Secondary | ICD-10-CM | POA: Insufficient documentation

## 2018-12-25 DIAGNOSIS — W1800XA Striking against unspecified object with subsequent fall, initial encounter: Secondary | ICD-10-CM

## 2018-12-25 DIAGNOSIS — M5440 Lumbago with sciatica, unspecified side: Secondary | ICD-10-CM

## 2018-12-25 MED ORDER — BENZONATATE 200 MG PO CAPS: ORAL_CAPSULE | ORAL | 0 refills | Status: DC

## 2018-12-25 NOTE — Assessment & Plan Note (Signed)
Pelvis contusion due to a fall 12/26 Pelvis X ray

## 2018-12-25 NOTE — Progress Notes (Signed)
Subjective:  Patient ID: Breanna [name], female    DOB: 1931-11-09  Age: 85 y.o. MRN: [id number]  CC: No chief complaint on file.   HPI Breanna [name] presents for a fall on the kitchen floor on 12/26 - was sliding down w/a table cloth in her hand. She sat down on her butt and then fell and hit he floor w/a back of her head. No LOC/HA. No n/v. Mild HA  F/u HTN, DM, CAD f/u  Outpatient Medications Prior to Visit  Medication Sig Dispense Refill  . allopurinol (ZYLOPRIM) 300 MG tablet TAKE 1 TABLET BY MOUTH EVERY DAY 90 tablet 3  . amLODipine (NORVASC) 5 MG tablet TAKE 1 TABLET BY MOUTH EVERY DAY 90 tablet 3  . aspirin 81 MG EC tablet Take 81 mg by mouth daily.      . benzonatate (TESSALON) 200 MG capsule TAKE 1 CAPSULE BY MOUTH 2 TIMES DAILY AS NEEDED FOR COUGH. 60 capsule 1  . brimonidine (ALPHAGAN) 0.15 % ophthalmic solution 1 drop.    Marland Kitchen CALCIUM PO Take by mouth daily.    . carvedilol (COREG) 25 MG tablet TAKE 1 TABLET BY MOUTH TWICE A DAY WITH A MEAL 180 tablet 3  . Cholecalciferol (VITAMIN D3) 2000 UNITS capsule Take 2,000 Units by mouth daily.      . cyanocobalamin 2000 MCG tablet Take 2,000 mcg by mouth daily.    . diclofenac sodium (VOLTAREN) 1 % GEL Apply 2 g topically 2 (two) times daily. 200 g 3  . diphenhydramine-acetaminophen (TYLENOL PM) 25-500 MG TABS 1 tablet at bedtime as needed.    . dorzolamide-timolol (COSOPT) 22.3-6.8 MG/ML ophthalmic solution 1 drop.    Marland Kitchen gabapentin (NEURONTIN) 100 MG capsule TAKE 1 CAPSULE BY MOUTH THREE TIMES A DAY 270 capsule 1  . latanoprost (XALATAN) 0.005 % ophthalmic solution Place 1 drop into both eyes 2 (two) times daily.    Marland Kitchen losartan (COZAAR) 50 MG tablet TAKE 1 TABLET BY MOUTH EVERY DAY 90 tablet 3  . metFORMIN (GLUCOPHAGE) 500 MG tablet Take 1 tablet (500 mg total) by mouth daily with breakfast. 90 tablet 3  . NON FORMULARY CBD oil    . pantoprazole (PROTONIX) 40 MG tablet TAKE 1 TABLET BY MOUTH EVERY DAY 90 tablet 1  . sertraline (ZOLOFT) 100  MG tablet TAKE 1 TABLET BY MOUTH EVERY DAY 90 tablet 1  . timolol (TIMOPTIC) 0.25 % ophthalmic solution     . torsemide (DEMADEX) 20 MG tablet TAKE 1/2 TABLET (10MG ) BY MOUTH DAILY AS NEEDED FOR EDEMA (SWELLING) 90 tablet 2   No facility-administered medications prior to visit.     ROS: Review of Systems  Constitutional: Negative for activity change, appetite change, chills, fatigue and unexpected weight change.  HENT: Negative for congestion, mouth sores and sinus pressure.   Eyes: Negative for visual disturbance.  Respiratory: Negative for cough and chest tightness.   Gastrointestinal: Negative for abdominal pain and nausea.  Genitourinary: Negative for difficulty urinating, frequency and vaginal pain.  Musculoskeletal: Positive for gait problem. Negative for back pain.  Skin: Negative for pallor and rash.  Neurological: Negative for dizziness, tremors, weakness, numbness and headaches.  Psychiatric/Behavioral: Negative for confusion, sleep disturbance and suicidal ideas.    Objective:  BP (!) 152/76 (BP Location: Left Arm, Patient Position: Sitting, Cuff Size: Normal)   Pulse (!) 48   Temp 97.6 F (36.4 C) (Oral)   Ht 5\' 2"  (1.575 m)   Wt 144 lb (65.3 kg)  SpO2 98%   BMI 26.34 kg/m   BP Readings from Last 3 Encounters:  12/25/18 (!) 152/76  11/13/18 (!) 142/80  10/10/18 136/72    Wt Readings from Last 3 Encounters:  12/25/18 144 lb (65.3 kg)  11/13/18 145 lb (65.8 kg)  10/10/18 143 lb (64.9 kg)    Physical Exam Constitutional:      General: She is not in acute distress.    Appearance: She is well-developed.  HENT:     Head: Normocephalic.     Right Ear: External ear normal.     Left Ear: External ear normal.     Nose: Nose normal.  Eyes:     General:        Right eye: No discharge.        Left eye: No discharge.     Conjunctiva/sclera: Conjunctivae normal.     Pupils: Pupils are equal, round, and reactive to light.  Neck:     Musculoskeletal: Normal  range of motion and neck supple.     Thyroid: No thyromegaly.     Vascular: No JVD.     Trachea: No tracheal deviation.  Cardiovascular:     Rate and Rhythm: Normal rate and regular rhythm.     Heart sounds: Normal heart sounds.  Pulmonary:     Effort: No respiratory distress.     Breath sounds: No stridor. No wheezing.  Abdominal:     General: Bowel sounds are normal. There is no distension.     Palpations: Abdomen is soft. There is no mass.     Tenderness: There is no abdominal tenderness. There is no guarding or rebound.  Musculoskeletal:        General: Tenderness present.  Lymphadenopathy:     Cervical: No cervical adenopathy.  Skin:    Findings: No erythema or rash.  Neurological:     Cranial Nerves: No cranial nerve deficit.     Motor: No abnormal muscle tone.     Coordination: Coordination normal.     Deep Tendon Reflexes: Reflexes normal.  Psychiatric:        Behavior: Behavior normal.        Thought Content: Thought content normal.        Judgment: Judgment normal.   sitting bones are tender B Head NT Neck w/good ROM Walking w/cane  Lab Results  Component Value Date   WBC 5.0 08/13/2018   HGB 11.0 (L) 08/13/2018   HCT 34.3 (L) 08/13/2018   PLT 133.0 (L) 08/13/2018   GLUCOSE 109 (H) 11/13/2018   CHOL 154 10/19/2014   TRIG 56.0 10/19/2014   HDL 61.10 10/19/2014   LDLCALC 82 10/19/2014   ALT 9 08/13/2017   AST 15 08/13/2017   NA 142 11/13/2018   K 4.1 11/13/2018   CL 106 11/13/2018   CREATININE 1.21 (H) 11/13/2018   BUN 23 11/13/2018   CO2 27 11/13/2018   TSH 1.16 04/04/2018   HGBA1C 6.4 11/13/2018    Dg Knee Complete 4 Views Right  Result Date: 09/22/2018 CLINICAL DATA:  85 year old female with right knee pain and swelling for the past week. No injury. Initial encounter. EXAM: RIGHT KNEE - COMPLETE 4+ VIEW COMPARISON:  None. FINDINGS: Moderate to marked tricompartment degenerative changes most notable medial tibiofemoral joint space and  patellofemoral joint space. No fracture or dislocation. IMPRESSION: 1. Moderate to marked tricompartment degenerative changes most notable medial tibiofemoral joint space and patellofemoral joint space. 2. No fracture or dislocation. Electronically Signed   By: Remo Lipps  Jeannine Kitten M.D.   On: 09/22/2018 19:54    Assessment & Plan:   There are no diagnoses linked to this encounter.   No orders of the defined types were placed in this encounter.    Follow-up: No follow-ups on file.  Walker Kehr, MD

## 2018-12-25 NOTE — Assessment & Plan Note (Signed)
Pelvis contusion due to a fall 12/26 Pelvis X ray Head CT

## 2018-12-25 NOTE — Assessment & Plan Note (Signed)
Fall 12/18/18 CT

## 2018-12-26 ENCOUNTER — Ambulatory Visit
Admission: RE | Admit: 2018-12-26 | Discharge: 2018-12-26 | Disposition: A | Discharge status: Home / Self Care | Payer: Medicare Other | Source: Ambulatory Visit | Attending: Internal Medicine | Admitting: Internal Medicine

## 2018-12-26 DIAGNOSIS — G44319 Acute post-traumatic headache, not intractable: Secondary | ICD-10-CM

## 2018-12-26 DIAGNOSIS — W1800XA Striking against unspecified object with subsequent fall, initial encounter: Secondary | ICD-10-CM

## 2018-12-26 DIAGNOSIS — S0003XA Contusion of scalp, initial encounter: Secondary | ICD-10-CM

## 2018-12-29 ENCOUNTER — Other Ambulatory Visit: Payer: Self-pay | Admitting: Internal Medicine

## 2018-12-31 ENCOUNTER — Telehealth: Payer: Self-pay

## 2018-12-31 NOTE — Telephone Encounter (Signed)
Pt states she is still very sore on her bottom where she fell and would like to know if there is anything else she can do to help with the pain. Please advise.

## 2019-01-01 NOTE — Telephone Encounter (Signed)
Tylenol prn Ice prn Thx

## 2019-01-01 NOTE — Telephone Encounter (Signed)
Pt.notified

## 2019-01-12 ENCOUNTER — Ambulatory Visit: Payer: Medicare Other | Admitting: Orthotics

## 2019-01-12 DIAGNOSIS — M2042 Other hammer toe(s) (acquired), left foot: Principal | ICD-10-CM

## 2019-01-12 DIAGNOSIS — M2041 Other hammer toe(s) (acquired), right foot: Secondary | ICD-10-CM

## 2019-01-12 DIAGNOSIS — M2141 Flat foot [pes planus] (acquired), right foot: Secondary | ICD-10-CM

## 2019-01-12 DIAGNOSIS — M2142 Flat foot [pes planus] (acquired), left foot: Secondary | ICD-10-CM

## 2019-01-12 NOTE — Progress Notes (Signed)
Reordered [employer] shoe [id number] 7 W.. Confirmation [id number]

## 2019-01-19 ENCOUNTER — Ambulatory Visit: Payer: Medicare Other | Admitting: Orthotics

## 2019-01-19 DIAGNOSIS — M2141 Flat foot [pes planus] (acquired), right foot: Secondary | ICD-10-CM

## 2019-01-19 DIAGNOSIS — M2142 Flat foot [pes planus] (acquired), left foot: Secondary | ICD-10-CM

## 2019-01-19 DIAGNOSIS — M2042 Other hammer toe(s) (acquired), left foot: Principal | ICD-10-CM

## 2019-01-19 DIAGNOSIS — E119 Type 2 diabetes mellitus without complications: Secondary | ICD-10-CM

## 2019-01-19 DIAGNOSIS — M2041 Other hammer toe(s) (acquired), right foot: Secondary | ICD-10-CM

## 2019-01-19 NOTE — Progress Notes (Signed)
Patient received reordered shoes (ones like her [employer] she had recevied in the past).  She seemed to like them just fine.  She was instructioned to wear them in the house with her DBS inserts.

## 2019-02-12 ENCOUNTER — Encounter: Payer: Self-pay | Admitting: Internal Medicine

## 2019-02-12 ENCOUNTER — Other Ambulatory Visit (INDEPENDENT_AMBULATORY_CARE_PROVIDER_SITE_OTHER): Payer: Medicare Other

## 2019-02-12 ENCOUNTER — Ambulatory Visit: Payer: Medicare Other | Admitting: Internal Medicine

## 2019-02-12 VITALS — BP 150/86 | HR 51 | Temp 98.2°F | Ht 62.0 in | Wt 142.0 lb

## 2019-02-12 DIAGNOSIS — R627 Adult failure to thrive: Secondary | ICD-10-CM | POA: Diagnosis not present

## 2019-02-12 DIAGNOSIS — G8929 Other chronic pain: Secondary | ICD-10-CM

## 2019-02-12 DIAGNOSIS — E1121 Type 2 diabetes mellitus with diabetic nephropathy: Secondary | ICD-10-CM | POA: Diagnosis not present

## 2019-02-12 DIAGNOSIS — I10 Essential (primary) hypertension: Secondary | ICD-10-CM | POA: Diagnosis not present

## 2019-02-12 DIAGNOSIS — I6350 Cerebral infarction due to unspecified occlusion or stenosis of unspecified cerebral artery: Secondary | ICD-10-CM | POA: Diagnosis not present

## 2019-02-12 DIAGNOSIS — M5440 Lumbago with sciatica, unspecified side: Secondary | ICD-10-CM

## 2019-02-12 DIAGNOSIS — E1129 Type 2 diabetes mellitus with other diabetic kidney complication: Secondary | ICD-10-CM | POA: Diagnosis not present

## 2019-02-12 LAB — BASIC METABOLIC PANEL
BUN: 21 mg/dL (ref 6–23)
CO2: 27 mEq/L (ref 19–32)
Calcium: 9.1 mg/dL (ref 8.4–10.5)
Chloride: 106 mEq/L (ref 96–112)
Creatinine, Ser: 1.06 mg/dL (ref 0.40–1.20)
GFR: 59.55 mL/min — ABNORMAL LOW (ref >60.00)
Glucose, Bld: 95 mg/dL (ref 70–99)
Potassium: 3.9 mEq/L (ref 3.5–5.1)
Sodium: 141 mEq/L (ref 135–145)

## 2019-02-12 LAB — HEMOGLOBIN A1C: Hgb A1c MFr Bld: 6.2 % (ref 4.6–6.5)

## 2019-02-12 IMAGING — CT CT HEAD W/O CM
4 series · 17 of 47 positions shown, 19 images · non-contrast
Comparison: CT head 01/12/2015

CLINICAL DATA: Acute posttraumatic headache. Fall 12/18/2018 with
head injury

EXAM:
CT HEAD WITHOUT CONTRAST
TECHNIQUE: Contiguous axial images were obtained from the base of the skull
through the vertex without intravenous contrast.

[Series 2: head 5.00 hr40 s3 ibhc · axial · 0.41mm/px · z∈[-582,-462]mm · 7 of 32 slices shown, 9 images]
[im 4/32  brain]
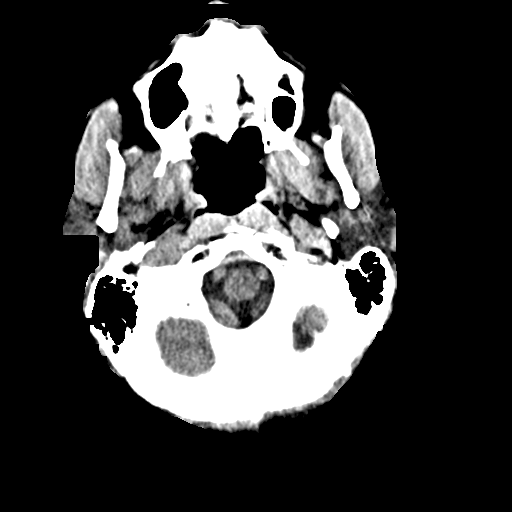
[im 4/32  bone]
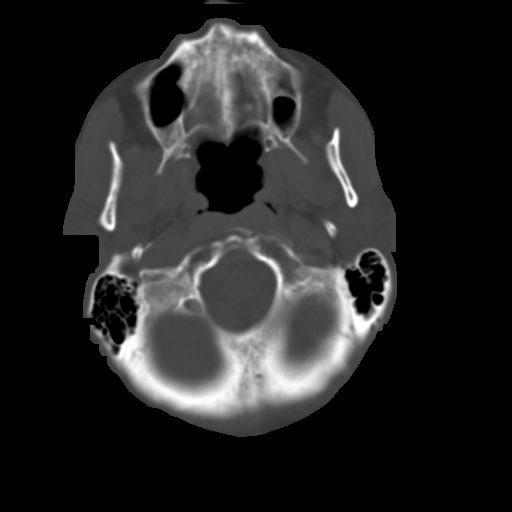
[im 8/32  brain]
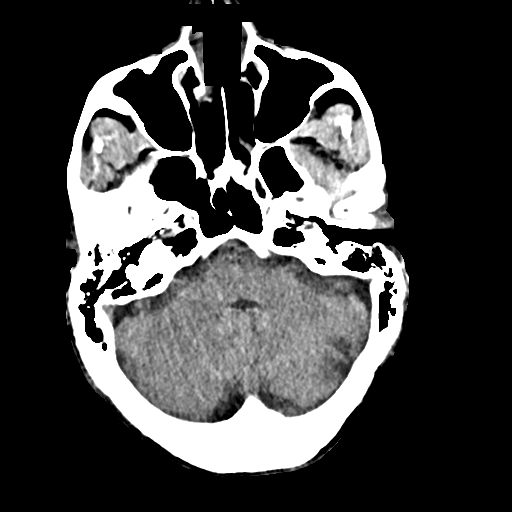
[im 12/32  brain]
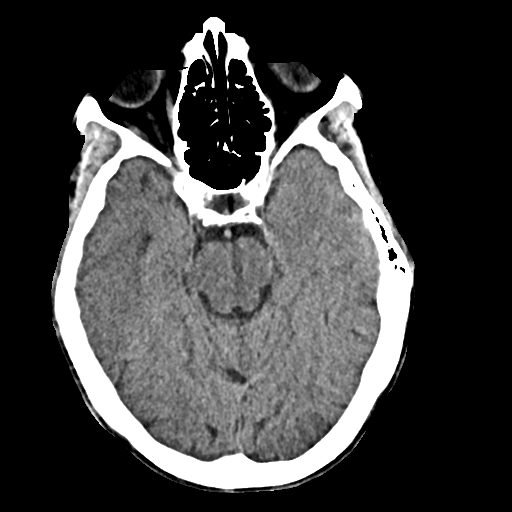
[im 16/32  brain]
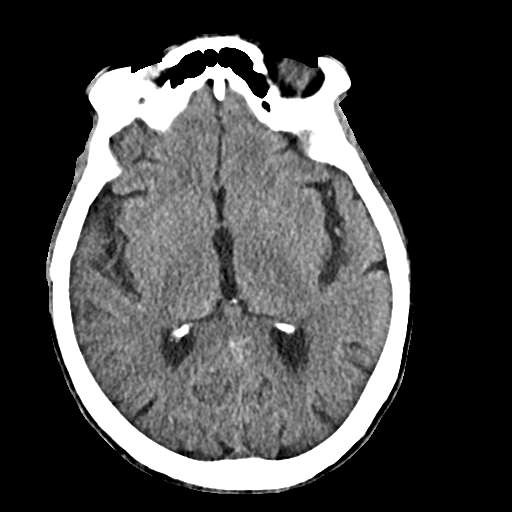
[im 20/32  brain]
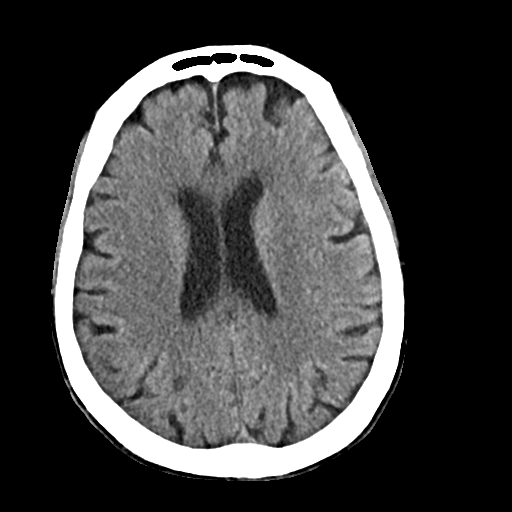
[im 20/32  bone]
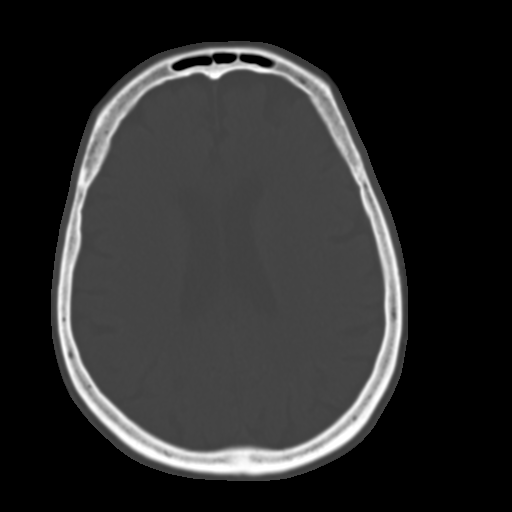
[im 24/32  brain]
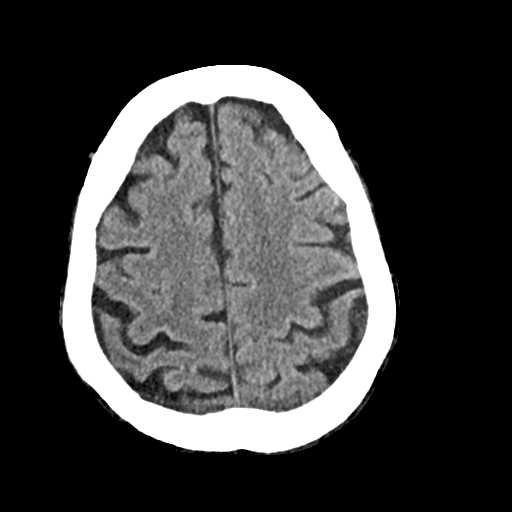
[im 28/32  brain]
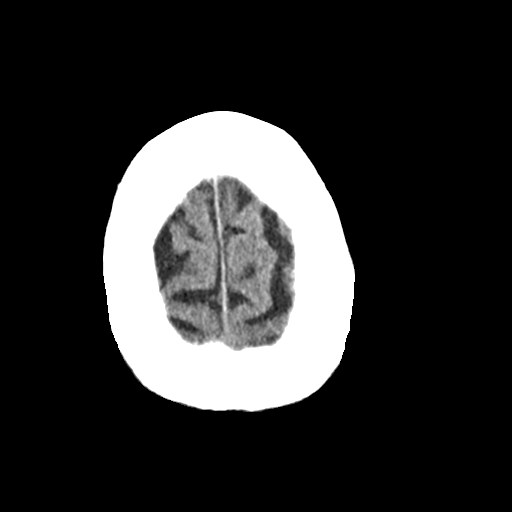

[Series 3: head 2.00 hr60 s3 bone · axial · 0.41mm/px · z∈[-585,-529]mm · 4 of 80 slices shown]
[im 8/80  bone]
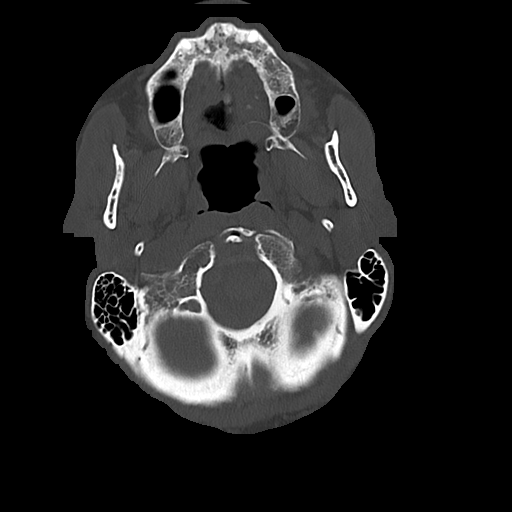
[im 16/80  bone]
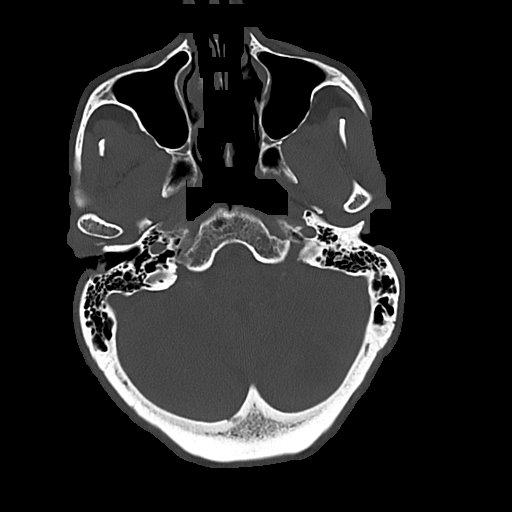
[im 24/80  bone]
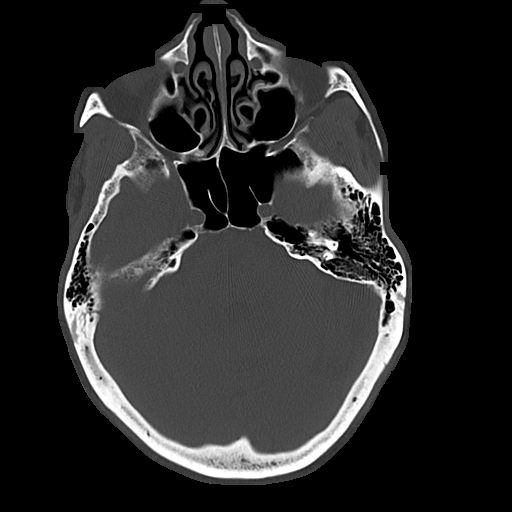
[im 36/80  bone]
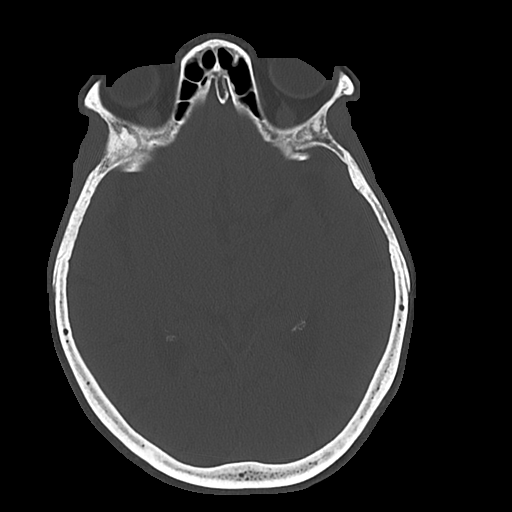

[Series 4: head 3.00 hr40 s3 sag · sagittal · 0.31mm/px · 3 of 84 slices shown]
[im 28/84  brain]
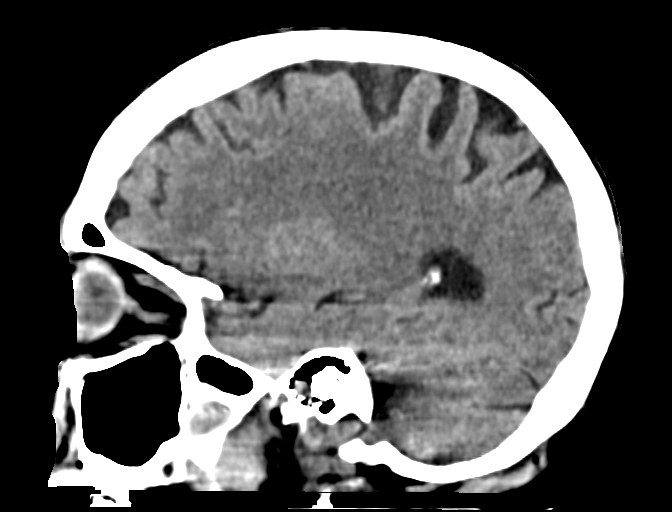
[im 42/84  brain]
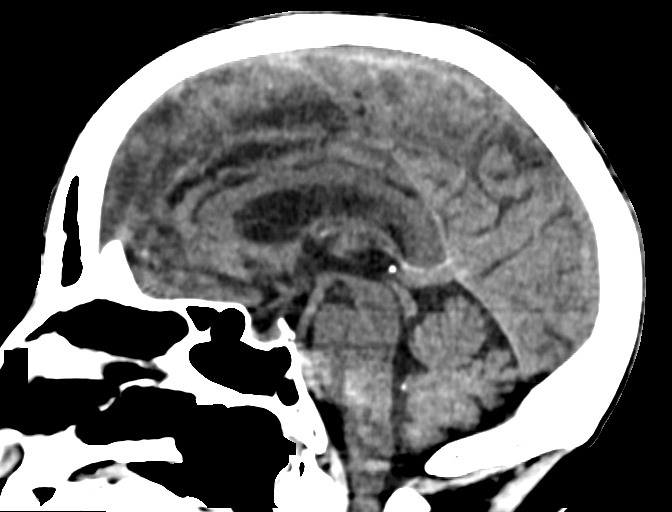
[im 56/84  brain]
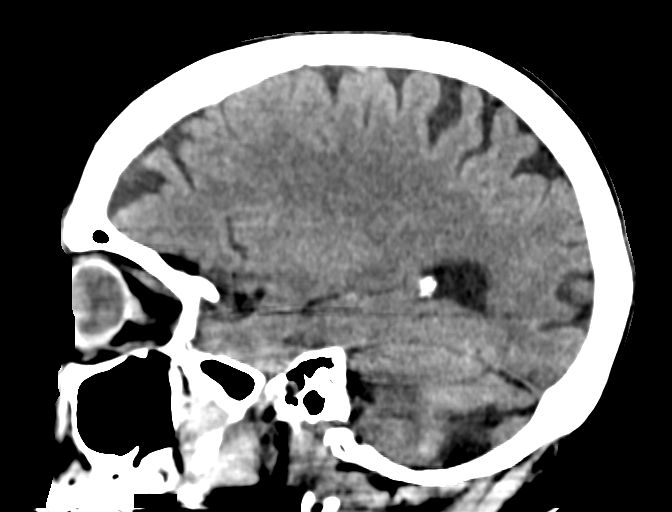

[Series 6: head 3.00 hr40 s3 cor · coronal · 0.31mm/px · 3 of 102 slices shown]
[im 34/102  brain]
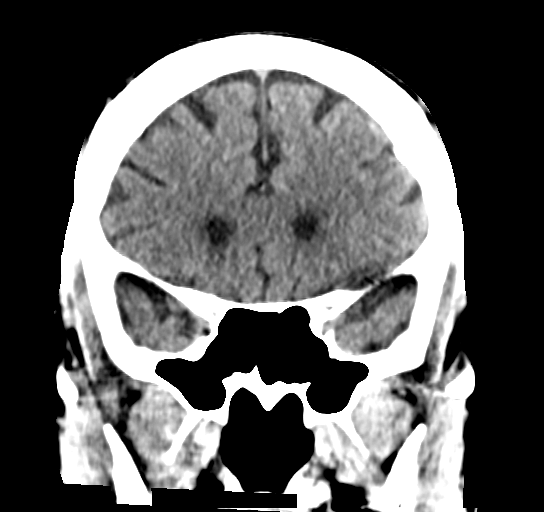
[im 45/102  brain]
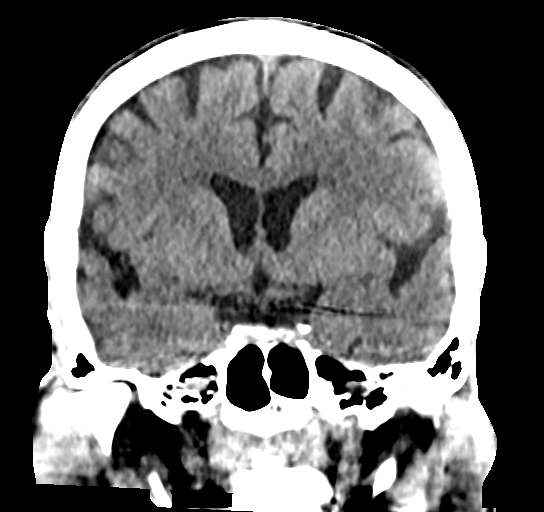
[im 57/102  brain]
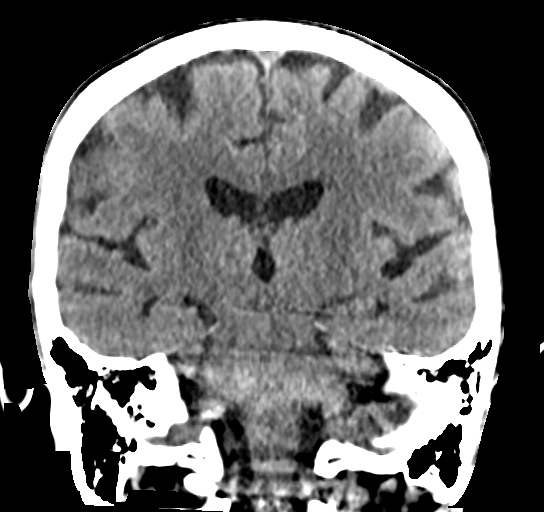

[17 of 47 positions shown; findings below may reference images not displayed]

FINDINGS: Brain: Mild atrophy.  Chronic infarct left cerebellum unchanged.

Negative for acute infarct, hemorrhage, or mass. No fluid collection
or midline shift.

Vascular: Negative for hyperdense vessel

Skull: Negative

Sinuses/Orbits: Mild mucosal edema paranasal sinuses. Bilateral
cataract surgery

Other: None
IMPRESSION: No acute abnormality and no change from the prior study.

## 2019-02-12 NOTE — Assessment & Plan Note (Signed)
Remote  Baby ASA BP meds

## 2019-02-12 NOTE — Assessment & Plan Note (Signed)
Metformin 

## 2019-02-12 NOTE — Patient Instructions (Addendum)
Spinal Stenosis  Spinal stenosis happens when the open space (spinal canal) between the bones of your spine (vertebrae) gets smaller. It is caused by bone pushing into the open spaces of your backbone (spine). This puts pressure on your backbone and the nerves in your backbone. Treatment often focuses on managing any pain and symptoms. In some cases, surgery may be needed. Follow these instructions at home: Managing pain, stiffness, and swelling   Do all exercises and stretches as told by your doctor.  Stand and sit up straight (use good posture). If you were given a brace or a corset, wear it as told by your doctor.  Do not do any activities that cause pain. Ask your doctor what activities are safe for you.  Do not lift anything that is heavier than 10 lb (4.5 kg) or heavier than your doctor tells you.  Try to stay at a healthy weight. Talk with your doctor if you need help losing weight.  If directed, put heat on the affected area as often as told by your doctor. Use the heat source that your doctor recommends, such as a moist heat pack or a heating pad. ? Put a towel between your skin and the heat source. ? Leave the heat on for 20-30 minutes. ? Remove the heat if your skin turns bright red. This is especially important if you are not able to feel pain, heat, or cold. You may have a greater risk of getting burned. General instructions  Take over-the-counter and prescription medicines only as told by your doctor.  Do not use any products that contain nicotine or tobacco, such as cigarettes and e-cigarettes. If you need help quitting, ask your doctor.  Eat a healthy diet. This includes plenty of fruits and vegetables, whole grains, and low-fat (lean) protein.  Keep all follow-up visits as told by your doctor. This is important. Contact a doctor if:  Your symptoms do not get better.  Your symptoms get worse.  You have a fever. Get help right away if:  You have new or worse pain  in your neck or upper back.  You have very bad pain that medicine does not control.  You are dizzy.  You have vision problems, blurred vision, or double vision.  You have a very bad headache that is worse when you stand.  You feel sick to your stomach (nauseous).  You throw up (vomit).  You have new or worse numbness or tingling in your back or legs.  You have pain, redness, swelling, or warmth in your arm or leg. Summary  Spinal stenosis happens when the open space (spinal canal) between the bones of your spine gets smaller (narrow).  Contact a doctor if your symptoms get worse.  In some cases, surgery may be needed. This information is not intended to replace advice given to you by your health care provider. Make sure you discuss any questions you have with your health care provider. Document Released: 04/05/2011 Document Revised: 11/14/2016 Document Reviewed: 11/14/2016 Elsevier Interactive Patient Education  2019 [place] [employer]

## 2019-02-12 NOTE — Assessment & Plan Note (Signed)
Wt Readings from Last 3 Encounters:  02/12/19 142 lb (64.4 kg)  12/25/18 144 lb (65.3 kg)  11/13/18 145 lb (65.8 kg)

## 2019-02-12 NOTE — Progress Notes (Signed)
Subjective:  Patient ID: Breanna [name], female    DOB: 03/05/1931  Age: 85 y.o. MRN: [id number]  CC: No chief complaint on file.   HPI NAZYIA GAUGH presents for LBP, B LE weakness, DM, HTN f/u  Outpatient Medications Prior to Visit  Medication Sig Dispense Refill  . Acetaminophen (TYLENOL ARTHRITIS PAIN PO) Take by mouth.    Marland Kitchen allopurinol (ZYLOPRIM) 300 MG tablet TAKE 1 TABLET BY MOUTH EVERY DAY 90 tablet 3  . amLODipine (NORVASC) 5 MG tablet TAKE 1 TABLET BY MOUTH EVERY DAY 90 tablet 3  . aspirin 81 MG EC tablet Take 81 mg by mouth daily.      . benzonatate (TESSALON) 200 MG capsule TAKE 1 CAPSULE BY MOUTH 2 TIMES DAILY AS NEEDED FOR COUGH. 30 capsule 0  . brimonidine (ALPHAGAN) 0.15 % ophthalmic solution 1 drop.    Marland Kitchen CALCIUM PO Take by mouth daily.    . carvedilol (COREG) 25 MG tablet TAKE 1 TABLET BY MOUTH TWICE A DAY WITH A MEAL 180 tablet 3  . Cholecalciferol (VITAMIN D3) 2000 UNITS capsule Take 2,000 Units by mouth daily.      . cyanocobalamin 2000 MCG tablet Take 2,000 mcg by mouth daily.    . diclofenac sodium (VOLTAREN) 1 % GEL Apply 2 g topically 2 (two) times daily. 200 g 3  . diphenhydramine-acetaminophen (TYLENOL PM) 25-500 MG TABS 1 tablet at bedtime as needed.    . dorzolamide-timolol (COSOPT) 22.3-6.8 MG/ML ophthalmic solution 1 drop.    Marland Kitchen gabapentin (NEURONTIN) 100 MG capsule TAKE 1 CAPSULE BY MOUTH THREE TIMES A DAY 270 capsule 1  . latanoprost (XALATAN) 0.005 % ophthalmic solution Place 1 drop into both eyes 2 (two) times daily.    Marland Kitchen losartan (COZAAR) 50 MG tablet TAKE 1 TABLET BY MOUTH EVERY DAY 90 tablet 3  . metFORMIN (GLUCOPHAGE) 500 MG tablet Take 1 tablet (500 mg total) by mouth daily with breakfast. 90 tablet 3  . NON FORMULARY CBD oil    . pantoprazole (PROTONIX) 40 MG tablet TAKE 1 TABLET BY MOUTH EVERY DAY 90 tablet 1  . sertraline (ZOLOFT) 100 MG tablet TAKE 1 TABLET BY MOUTH EVERY DAY 90 tablet 1  . timolol (TIMOPTIC) 0.25 % ophthalmic solution     .  torsemide (DEMADEX) 20 MG tablet TAKE 1/2 TABLET (10MG ) BY MOUTH DAILY AS NEEDED FOR EDEMA (SWELLING) 90 tablet 2   No facility-administered medications prior to visit.     ROS: Review of Systems  Constitutional: Negative for activity change, appetite change, chills, fatigue and unexpected weight change.  HENT: Negative for congestion, mouth sores and sinus pressure.   Eyes: Negative for visual disturbance.  Respiratory: Negative for cough and chest tightness.   Gastrointestinal: Negative for abdominal pain and nausea.  Genitourinary: Negative for difficulty urinating, frequency and vaginal pain.  Musculoskeletal: Positive for arthralgias, back pain and gait problem.  Skin: Negative for pallor and rash.  Neurological: Negative for dizziness, tremors, weakness, numbness and headaches.  Psychiatric/Behavioral: Positive for decreased concentration. Negative for confusion and sleep disturbance.    Objective:  BP (!) 150/86 (BP Location: Left Arm, Patient Position: Sitting, Cuff Size: Normal)   Pulse (!) 51   Temp 98.2 F (36.8 C) (Oral)   Ht 5\' 2"  (1.575 m)   Wt 142 lb (64.4 kg)   SpO2 98%   BMI 25.97 kg/m   BP Readings from Last 3 Encounters:  02/12/19 (!) 150/86  12/25/18 (!) 152/76  11/13/18 (!) 142/80  Wt Readings from Last 3 Encounters:  02/12/19 142 lb (64.4 kg)  12/25/18 144 lb (65.3 kg)  11/13/18 145 lb (65.8 kg)    Physical Exam Constitutional:      General: She is not in acute distress.    Appearance: She is well-developed.  HENT:     Head: Normocephalic.     Right Ear: External ear normal.     Left Ear: External ear normal.     Nose: Nose normal.  Eyes:     General:        Right eye: No discharge.        Left eye: No discharge.     Conjunctiva/sclera: Conjunctivae normal.     Pupils: Pupils are equal, round, and reactive to light.  Neck:     Musculoskeletal: Normal range of motion and neck supple.     Thyroid: No thyromegaly.     Vascular: No JVD.       Trachea: No tracheal deviation.  Cardiovascular:     Rate and Rhythm: Normal rate and regular rhythm.     Heart sounds: Normal heart sounds.  Pulmonary:     Effort: No respiratory distress.     Breath sounds: No stridor. No wheezing.  Abdominal:     General: Bowel sounds are normal. There is no distension.     Palpations: Abdomen is soft. There is no mass.     Tenderness: There is no abdominal tenderness. There is no guarding or rebound.  Musculoskeletal:        General: Tenderness present.  Lymphadenopathy:     Cervical: No cervical adenopathy.  Skin:    Findings: No erythema or rash.  Neurological:     Mental Status: She is oriented to person, place, and time.     Cranial Nerves: No cranial nerve deficit.     Motor: No abnormal muscle tone.     Coordination: Coordination abnormal.     Deep Tendon Reflexes: Reflexes normal.  Psychiatric:        Behavior: Behavior normal.        Thought Content: Thought content normal.        Judgment: Judgment normal.   cane LS tender Slow movements  Lab Results  Component Value Date   WBC 5.0 08/13/2018   HGB 11.0 (L) 08/13/2018   HCT 34.3 (L) 08/13/2018   PLT 133.0 (L) 08/13/2018   GLUCOSE 109 (H) 11/13/2018   CHOL 154 10/19/2014   TRIG 56.0 10/19/2014   HDL 61.10 10/19/2014   LDLCALC 82 10/19/2014   ALT 9 08/13/2017   AST 15 08/13/2017   NA 142 11/13/2018   K 4.1 11/13/2018   CL 106 11/13/2018   CREATININE 1.21 (H) 11/13/2018   BUN 23 11/13/2018   CO2 27 11/13/2018   TSH 1.16 04/04/2018   HGBA1C 6.4 11/13/2018    Ct Head Wo Contrast  Result Date: 12/26/2018 CLINICAL DATA:  Acute posttraumatic headache. Fall 12/18/2018 with head injury EXAM: CT HEAD WITHOUT CONTRAST TECHNIQUE: Contiguous axial images were obtained from the base of the skull through the vertex without intravenous contrast. COMPARISON:  CT head 01/12/2015 FINDINGS: Brain: Mild atrophy.  Chronic infarct left cerebellum unchanged. Negative for acute  infarct, hemorrhage, or mass. No fluid collection or midline shift. Vascular: Negative for hyperdense vessel Skull: Negative Sinuses/Orbits: Mild mucosal edema paranasal sinuses. Bilateral cataract surgery Other: None IMPRESSION: No acute abnormality and no change from the prior study. Electronically Signed   By: Franchot Gallo M.D.  On: 12/26/2018 17:25    Assessment & Plan:   There are no diagnoses linked to this encounter.   No orders of the defined types were placed in this encounter.    Follow-up: No follow-ups on file.  Walker Kehr, MD

## 2019-02-12 NOTE — Assessment & Plan Note (Signed)
Worse ?spinal stenosis Appt w/Dr Rolena Infante

## 2019-03-10 ENCOUNTER — Telehealth: Payer: Self-pay | Admitting: Internal Medicine

## 2019-03-10 DIAGNOSIS — R29898 Other symptoms and signs involving the musculoskeletal system: Secondary | ICD-10-CM

## 2019-03-10 NOTE — Telephone Encounter (Signed)
Copied from [place] [id number]. Topic: Quick Communication - See Telephone Encounter >> Mar 10, 2019 12:51 PM Bea Graff, NT wrote: CRM for notification. See Telephone encounter for: 03/10/19. Pt states her son suggested to see if a lift chair could be useful for her as she is having a hard time getting up and down from a regular chair. Please advise.

## 2019-03-12 NOTE — Telephone Encounter (Signed)
Ok. Pls write a Rx Thx

## 2019-03-12 NOTE — Telephone Encounter (Signed)
Order placed and message sent to [facility]

## 2019-05-20 ENCOUNTER — Encounter: Payer: Self-pay | Admitting: Internal Medicine

## 2019-05-20 ENCOUNTER — Ambulatory Visit (INDEPENDENT_AMBULATORY_CARE_PROVIDER_SITE_OTHER): Payer: Medicare Other | Admitting: Internal Medicine

## 2019-05-20 ENCOUNTER — Other Ambulatory Visit: Payer: Self-pay

## 2019-05-20 ENCOUNTER — Ambulatory Visit: Payer: Medicare Other | Admitting: Internal Medicine

## 2019-05-20 DIAGNOSIS — I10 Essential (primary) hypertension: Secondary | ICD-10-CM | POA: Diagnosis not present

## 2019-05-20 DIAGNOSIS — S0003XA Contusion of scalp, initial encounter: Secondary | ICD-10-CM | POA: Diagnosis not present

## 2019-05-20 DIAGNOSIS — E1121 Type 2 diabetes mellitus with diabetic nephropathy: Secondary | ICD-10-CM

## 2019-05-20 DIAGNOSIS — W1800XA Striking against unspecified object with subsequent fall, initial encounter: Secondary | ICD-10-CM

## 2019-05-20 MED ORDER — SPIRONOLACTONE 25 MG PO TABS: 25.0000 mg | ORAL_TABLET | Freq: Every day | ORAL | 11 refills | Status: DC

## 2019-05-20 NOTE — Assessment & Plan Note (Signed)
The pt's guest fell backwards on Breanna [name] - Breanna Mckenna fell under her and hit her head against brick wall on Sat evening last week. No LOC. No HA. CT head if problems

## 2019-05-20 NOTE — Assessment & Plan Note (Signed)
Labs On Metformin 

## 2019-05-20 NOTE — Assessment & Plan Note (Addendum)
The pt's guest fell backwards on Breanna [name] - Breanna Edelen fell under her and hit her head against brick wall on Sat evening last week. No LOC. No HA. CT head if problems

## 2019-05-20 NOTE — Assessment & Plan Note (Addendum)
Coreg, Amlodipine, Losartan BP discussed - high Add Spironolactone w/caution; labs later

## 2019-05-20 NOTE — Progress Notes (Signed)
Subjective:  Patient ID: Breanna [name], female    DOB: 11-Dec-1931  Age: 85 y.o. MRN: [id number]  CC: No chief complaint on file.   HPI [name] presents for HTN, DM, OA f/u The pt's guest fell backwards on [name] - [name] fell under her and hit her head against brick wall on Sat evening last week. No LOC. No HA.  Outpatient Medications Prior to Visit  Medication Sig Dispense Refill  . Acetaminophen (TYLENOL ARTHRITIS PAIN PO) Take by mouth.    Marland Kitchen allopurinol (ZYLOPRIM) 300 MG tablet TAKE 1 TABLET BY MOUTH EVERY DAY 90 tablet 3  . amLODipine (NORVASC) 5 MG tablet TAKE 1 TABLET BY MOUTH EVERY DAY 90 tablet 3  . aspirin 81 MG EC tablet Take 81 mg by mouth daily.      . brimonidine (ALPHAGAN) 0.15 % ophthalmic solution 1 drop.    Marland Kitchen CALCIUM PO Take by mouth daily.    . carvedilol (COREG) 25 MG tablet TAKE 1 TABLET BY MOUTH TWICE A DAY WITH A MEAL 180 tablet 3  . Cholecalciferol (VITAMIN D3) 2000 UNITS capsule Take 2,000 Units by mouth daily.      . cyanocobalamin 2000 MCG tablet Take 2,000 mcg by mouth daily.    . diclofenac sodium (VOLTAREN) 1 % GEL Apply 2 g topically 2 (two) times daily. 200 g 3  . diphenhydramine-acetaminophen (TYLENOL PM) 25-500 MG TABS 1 tablet at bedtime as needed.    . dorzolamide-timolol (COSOPT) 22.3-6.8 MG/ML ophthalmic solution 1 drop.    Marland Kitchen gabapentin (NEURONTIN) 100 MG capsule TAKE 1 CAPSULE BY MOUTH THREE TIMES A DAY 270 capsule 1  . latanoprost (XALATAN) 0.005 % ophthalmic solution Place 1 drop into both eyes 2 (two) times daily.    Marland Kitchen losartan (COZAAR) 50 MG tablet TAKE 1 TABLET BY MOUTH EVERY DAY 90 tablet 3  . metFORMIN (GLUCOPHAGE) 500 MG tablet Take 1 tablet (500 mg total) by mouth daily with breakfast. 90 tablet 3  . NON FORMULARY CBD oil    . pantoprazole (PROTONIX) 40 MG tablet TAKE 1 TABLET BY MOUTH EVERY DAY 90 tablet 1  . sertraline (ZOLOFT) 100 MG tablet TAKE 1 TABLET BY MOUTH EVERY DAY 90 tablet 1  . timolol (TIMOPTIC) 0.25 % ophthalmic  solution     . torsemide (DEMADEX) 20 MG tablet TAKE 1/2 TABLET (10MG ) BY MOUTH DAILY AS NEEDED FOR EDEMA (SWELLING) 90 tablet 2  . benzonatate (TESSALON) 200 MG capsule TAKE 1 CAPSULE BY MOUTH 2 TIMES DAILY AS NEEDED FOR COUGH. 30 capsule 0   No facility-administered medications prior to visit.     ROS: Review of Systems  Constitutional: Negative for activity change, appetite change, chills, fatigue and unexpected weight change.  HENT: Negative for congestion, mouth sores and sinus pressure.   Eyes: Negative for visual disturbance.  Respiratory: Negative for cough and chest tightness.   Gastrointestinal: Negative for abdominal pain and nausea.  Genitourinary: Negative for difficulty urinating, frequency and vaginal pain.  Musculoskeletal: Positive for arthralgias and gait problem. Negative for back pain.  Skin: Negative for pallor and rash.  Neurological: Negative for dizziness, tremors, weakness, numbness and headaches.  Psychiatric/Behavioral: Negative for confusion, sleep disturbance and suicidal ideas. The patient is nervous/anxious.     Objective:  BP (!) 180/82 (BP Location: Left Arm, Patient Position: Sitting, Cuff Size: Normal)   Pulse 60   Temp 98.6 F (37 C) (Oral)   Ht 5\' 2"  (1.575 m)   Wt 146 lb (  66.2 kg)   SpO2 96%   BMI 26.70 kg/m   BP Readings from Last 3 Encounters:  05/20/19 (!) 180/82  02/12/19 (!) 150/86  12/25/18 (!) 152/76    Wt Readings from Last 3 Encounters:  05/20/19 146 lb (66.2 kg)  02/12/19 142 lb (64.4 kg)  12/25/18 144 lb (65.3 kg)    Physical Exam Constitutional:      General: She is not in acute distress.    Appearance: She is well-developed.  HENT:     Head: Normocephalic.     Right Ear: External ear normal.     Left Ear: External ear normal.     Nose: Nose normal.  Eyes:     General:        Right eye: No discharge.        Left eye: No discharge.     Conjunctiva/sclera: Conjunctivae normal.     Pupils: Pupils are equal,  round, and reactive to light.  Neck:     Musculoskeletal: Normal range of motion and neck supple.     Thyroid: No thyromegaly.     Vascular: No JVD.     Trachea: No tracheal deviation.  Cardiovascular:     Rate and Rhythm: Normal rate and regular rhythm.     Heart sounds: Normal heart sounds.  Pulmonary:     Effort: No respiratory distress.     Breath sounds: No stridor. No wheezing.  Abdominal:     General: Bowel sounds are normal. There is no distension.     Palpations: Abdomen is soft. There is no mass.     Tenderness: There is no abdominal tenderness. There is no guarding or rebound.  Musculoskeletal:        General: No tenderness.  Lymphadenopathy:     Cervical: No cervical adenopathy.  Skin:    Findings: No erythema or rash.  Neurological:     Cranial Nerves: No cranial nerve deficit.     Motor: No abnormal muscle tone.     Coordination: Coordination normal.     Deep Tendon Reflexes: Reflexes normal.  Psychiatric:        Behavior: Behavior normal.        Thought Content: Thought content normal.        Judgment: Judgment normal.   cane 2 inches scab on the occipital area A/o/c  Lab Results  Component Value Date   WBC 5.0 08/13/2018   HGB 11.0 (L) 08/13/2018   HCT 34.3 (L) 08/13/2018   PLT 133.0 (L) 08/13/2018   GLUCOSE 95 02/12/2019   CHOL 154 10/19/2014   TRIG 56.0 10/19/2014   HDL 61.10 10/19/2014   LDLCALC 82 10/19/2014   ALT 9 08/13/2017   AST 15 08/13/2017   NA 141 02/12/2019   K 3.9 02/12/2019   CL 106 02/12/2019   CREATININE 1.06 02/12/2019   BUN 21 02/12/2019   CO2 27 02/12/2019   TSH 1.16 04/04/2018   HGBA1C 6.2 02/12/2019    Ct Head Wo Contrast  Result Date: 12/26/2018 CLINICAL DATA:  Acute posttraumatic headache. Fall 12/18/2018 with head injury EXAM: CT HEAD WITHOUT CONTRAST TECHNIQUE: Contiguous axial images were obtained from the base of the skull through the vertex without intravenous contrast. COMPARISON:  CT head 01/12/2015 FINDINGS:  Brain: Mild atrophy.  Chronic infarct left cerebellum unchanged. Negative for acute infarct, hemorrhage, or mass. No fluid collection or midline shift. Vascular: Negative for hyperdense vessel Skull: Negative Sinuses/Orbits: Mild mucosal edema paranasal sinuses. Bilateral cataract surgery Other: None IMPRESSION:  No acute abnormality and no change from the prior study. Electronically Signed   By: Franchot Gallo M.D.   On: 12/26/2018 17:25    Assessment & Plan:   There are no diagnoses linked to this encounter.   No orders of the defined types were placed in this encounter.    Follow-up: No follow-ups on file.  Walker Kehr, MD

## 2019-05-22 ENCOUNTER — Telehealth: Payer: Self-pay | Admitting: Internal Medicine

## 2019-05-22 ENCOUNTER — Telehealth: Payer: Self-pay | Admitting: Emergency Medicine

## 2019-05-22 ENCOUNTER — Other Ambulatory Visit: Payer: Self-pay | Admitting: Internal Medicine

## 2019-05-22 NOTE — Telephone Encounter (Signed)
Copied from [place] [phone]. Topic: Quick Communication - See Telephone Encounter >> May 22, 2019 10:12 AM Berneta Levins wrote: CRM for notification. See Telephone encounter for: 05/22/19.  Pt's son, [name], calling.  States he needed to report update on pt's blood pressure: Pt's blood pressure went down 20 points but it is still not at 120 yet.  [name] states they are watching it closely and he will update again this afternoon.  Pt's son wants to know if the 20 points are drastic. [name] can be reached at [phone]

## 2019-05-22 NOTE — Telephone Encounter (Signed)
Pts son called and left voicemail on check in desk phone. Pt was calling to see if medication that was prescribed at appt was working good enough. Please give him a call back thanks.

## 2019-05-22 NOTE — Telephone Encounter (Signed)
I have talked with patient, confirmed that patient is talking about systolic number for blood pressure---started spironolactone and her bp reading is 160/78 and she feels better today than yesterday---routing to dr plotnikov, patient wanted dr plotnikov to know, fyi.Marland KitchenMarland KitchenMarland Kitchen

## 2019-05-22 NOTE — Telephone Encounter (Signed)
pts son notified that the BP drop is okay as long as she does not start having any negative side effects like dizziness.

## 2019-06-05 ENCOUNTER — Other Ambulatory Visit: Payer: Self-pay | Admitting: Internal Medicine

## 2019-06-05 DIAGNOSIS — Z1231 Encounter for screening mammogram for malignant neoplasm of breast: Secondary | ICD-10-CM

## 2019-06-11 ENCOUNTER — Other Ambulatory Visit: Payer: Self-pay | Admitting: Internal Medicine

## 2019-07-01 ENCOUNTER — Other Ambulatory Visit: Payer: Self-pay

## 2019-07-01 ENCOUNTER — Other Ambulatory Visit (INDEPENDENT_AMBULATORY_CARE_PROVIDER_SITE_OTHER): Payer: Medicare Other

## 2019-07-01 ENCOUNTER — Encounter: Payer: Self-pay | Admitting: Internal Medicine

## 2019-07-01 ENCOUNTER — Ambulatory Visit (INDEPENDENT_AMBULATORY_CARE_PROVIDER_SITE_OTHER): Payer: Medicare Other | Admitting: Internal Medicine

## 2019-07-01 DIAGNOSIS — R5383 Other fatigue: Secondary | ICD-10-CM | POA: Diagnosis not present

## 2019-07-01 DIAGNOSIS — I10 Essential (primary) hypertension: Secondary | ICD-10-CM

## 2019-07-01 DIAGNOSIS — R634 Abnormal weight loss: Secondary | ICD-10-CM

## 2019-07-01 DIAGNOSIS — E1129 Type 2 diabetes mellitus with other diabetic kidney complication: Secondary | ICD-10-CM

## 2019-07-01 DIAGNOSIS — E1121 Type 2 diabetes mellitus with diabetic nephropathy: Secondary | ICD-10-CM | POA: Diagnosis not present

## 2019-07-01 LAB — BASIC METABOLIC PANEL
BUN: 43 mg/dL — ABNORMAL HIGH (ref 6–23)
CO2: 25 mEq/L (ref 19–32)
Calcium: 9.6 mg/dL (ref 8.4–10.5)
Chloride: 107 mEq/L (ref 96–112)
Creatinine, Ser: 1.43 mg/dL — ABNORMAL HIGH (ref 0.40–1.20)
GFR: 42.11 mL/min — ABNORMAL LOW (ref >60.00)
Glucose, Bld: 102 mg/dL — ABNORMAL HIGH (ref 70–99)
Potassium: 4.7 mEq/L (ref 3.5–5.1)
Sodium: 140 mEq/L (ref 135–145)

## 2019-07-01 LAB — HEMOGLOBIN A1C: Hgb A1c MFr Bld: 6.3 % (ref 4.6–6.5)

## 2019-07-01 NOTE — Assessment & Plan Note (Signed)
better 

## 2019-07-01 NOTE — Assessment & Plan Note (Signed)
On metformin

## 2019-07-01 NOTE — Patient Instructions (Addendum)
If you have medicare related insurance (such as traditional Medicare, [employer], [employer], or similar), Please make an appointment at the scheduling desk with Sharee Pimple, the [employer], for your Wellness visit in this office, which is a benefit with your insurance.   Wt Readings from Last 3 Encounters:  07/01/19 142 lb (64.4 kg)  05/20/19 146 lb (66.2 kg)  02/12/19 142 lb (64.4 kg)    BP Readings from Last 3 Encounters:  07/01/19 (!) 146/76  05/20/19 (!) 180/82  02/12/19 (!) 150/86

## 2019-07-01 NOTE — Progress Notes (Signed)
Subjective:  Patient ID: Breanna [name], female    DOB: May 15, 1931  Age: 85 y.o. MRN: [id number]  CC: No chief complaint on file.   HPI Breanna [name] presents for DM, LBP, DM f/u  Outpatient Medications Prior to Visit  Medication Sig Dispense Refill  . Acetaminophen (TYLENOL ARTHRITIS PAIN PO) Take by mouth.    Marland Kitchen allopurinol (ZYLOPRIM) 300 MG tablet TAKE 1 TABLET BY MOUTH EVERY DAY 90 tablet 3  . amLODipine (NORVASC) 5 MG tablet TAKE 1 TABLET BY MOUTH EVERY DAY 90 tablet 3  . aspirin 81 MG EC tablet Take 81 mg by mouth daily.      . brimonidine (ALPHAGAN) 0.15 % ophthalmic solution 1 drop.    Marland Kitchen CALCIUM PO Take by mouth daily.    . carvedilol (COREG) 25 MG tablet TAKE 1 TABLET BY MOUTH TWICE A DAY WITH A MEAL 180 tablet 3  . Cholecalciferol (VITAMIN D3) 2000 UNITS capsule Take 2,000 Units by mouth daily.      . cyanocobalamin 2000 MCG tablet Take 2,000 mcg by mouth daily.    . diclofenac sodium (VOLTAREN) 1 % GEL Apply 2 g topically 2 (two) times daily. 200 g 3  . diphenhydramine-acetaminophen (TYLENOL PM) 25-500 MG TABS 1 tablet at bedtime as needed.    . dorzolamide-timolol (COSOPT) 22.3-6.8 MG/ML ophthalmic solution 1 drop.    Marland Kitchen gabapentin (NEURONTIN) 100 MG capsule TAKE 1 CAPSULE BY MOUTH THREE TIMES A DAY 270 capsule 1  . latanoprost (XALATAN) 0.005 % ophthalmic solution Place 1 drop into both eyes 2 (two) times daily.    Marland Kitchen losartan (COZAAR) 50 MG tablet TAKE 1 TABLET BY MOUTH EVERY DAY 90 tablet 3  . metFORMIN (GLUCOPHAGE) 500 MG tablet Take 1 tablet (500 mg total) by mouth daily with breakfast. 90 tablet 3  . NON FORMULARY CBD oil    . pantoprazole (PROTONIX) 40 MG tablet TAKE 1 TABLET BY MOUTH EVERY DAY 90 tablet 1  . sertraline (ZOLOFT) 100 MG tablet TAKE 1 TABLET BY MOUTH EVERY DAY 90 tablet 1  . spironolactone (ALDACTONE) 25 MG tablet Take 1 tablet (25 mg total) by mouth daily. 30 tablet 11  . timolol (TIMOPTIC) 0.25 % ophthalmic solution     . torsemide (DEMADEX) 20 MG tablet  TAKE 1/2 TABLET (10MG ) BY MOUTH DAILY AS NEEDED FOR EDEMA (SWELLING) 90 tablet 2   No facility-administered medications prior to visit.     ROS: Review of Systems  Constitutional: Positive for unexpected weight change. Negative for activity change, appetite change, chills and fatigue.  HENT: Negative for congestion, mouth sores and sinus pressure.   Eyes: Negative for visual disturbance.  Respiratory: Negative for cough and chest tightness.   Gastrointestinal: Negative for abdominal pain and nausea.  Genitourinary: Negative for difficulty urinating, frequency and vaginal pain.  Musculoskeletal: Positive for arthralgias, back pain, gait problem and neck pain.  Skin: Negative for pallor and rash.  Neurological: Negative for dizziness, tremors, weakness, numbness and headaches.  Psychiatric/Behavioral: Negative for confusion, sleep disturbance and suicidal ideas. The patient is nervous/anxious.     Objective:  BP (!) 146/76 (BP Location: Left Arm, Patient Position: Sitting, Cuff Size: Normal)   Pulse (!) 47   Temp 97.7 F (36.5 C) (Oral)   Ht 5\' 2"  (1.575 m)   Wt 142 lb (64.4 kg)   SpO2 97%   BMI 25.97 kg/m   BP Readings from Last 3 Encounters:  07/01/19 (!) 146/76  05/20/19 (!) 180/82  02/12/19 Marland Kitchen)  150/86    Wt Readings from Last 3 Encounters:  07/01/19 142 lb (64.4 kg)  05/20/19 146 lb (66.2 kg)  02/12/19 142 lb (64.4 kg)    Physical Exam Constitutional:      General: She is not in acute distress.    Appearance: She is well-developed.  HENT:     Head: Normocephalic.     Right Ear: External ear normal.     Left Ear: External ear normal.     Nose: Nose normal.  Eyes:     General:        Right eye: No discharge.        Left eye: No discharge.     Conjunctiva/sclera: Conjunctivae normal.     Pupils: Pupils are equal, round, and reactive to light.  Neck:     Musculoskeletal: Normal range of motion and neck supple.     Thyroid: No thyromegaly.     Vascular: No  JVD.     Trachea: No tracheal deviation.  Cardiovascular:     Rate and Rhythm: Normal rate and regular rhythm.     Heart sounds: Normal heart sounds.  Pulmonary:     Effort: No respiratory distress.     Breath sounds: No stridor. No wheezing.  Abdominal:     General: Bowel sounds are normal. There is no distension.     Palpations: Abdomen is soft. There is no mass.     Tenderness: There is no abdominal tenderness. There is no guarding or rebound.  Musculoskeletal:        General: Tenderness present.  Lymphadenopathy:     Cervical: No cervical adenopathy.  Skin:    Findings: No erythema or rash.  Neurological:     Cranial Nerves: No cranial nerve deficit.     Motor: No abnormal muscle tone.     Coordination: Coordination normal.     Gait: Gait abnormal.     Deep Tendon Reflexes: Reflexes normal.  Psychiatric:        Behavior: Behavior normal.        Thought Content: Thought content normal.        Judgment: Judgment normal.   cane LS - tender  Lab Results  Component Value Date   WBC 5.0 08/13/2018   HGB 11.0 (L) 08/13/2018   HCT 34.3 (L) 08/13/2018   PLT 133.0 (L) 08/13/2018   GLUCOSE 95 02/12/2019   CHOL 154 10/19/2014   TRIG 56.0 10/19/2014   HDL 61.10 10/19/2014   LDLCALC 82 10/19/2014   ALT 9 08/13/2017   AST 15 08/13/2017   NA 141 02/12/2019   K 3.9 02/12/2019   CL 106 02/12/2019   CREATININE 1.06 02/12/2019   BUN 21 02/12/2019   CO2 27 02/12/2019   TSH 1.16 04/04/2018   HGBA1C 6.2 02/12/2019    Ct Head Wo Contrast  Result Date: 12/26/2018 CLINICAL DATA:  Acute posttraumatic headache. Fall 12/18/2018 with head injury EXAM: CT HEAD WITHOUT CONTRAST TECHNIQUE: Contiguous axial images were obtained from the base of the skull through the vertex without intravenous contrast. COMPARISON:  CT head 01/12/2015 FINDINGS: Brain: Mild atrophy.  Chronic infarct left cerebellum unchanged. Negative for acute infarct, hemorrhage, or mass. No fluid collection or midline  shift. Vascular: Negative for hyperdense vessel Skull: Negative Sinuses/Orbits: Mild mucosal edema paranasal sinuses. Bilateral cataract surgery Other: None IMPRESSION: No acute abnormality and no change from the prior study. Electronically Signed   By: Franchot Gallo M.D.   On: 12/26/2018 17:25    Assessment &  Plan:   There are no diagnoses linked to this encounter.   No orders of the defined types were placed in this encounter.    Follow-up: No follow-ups on file.  Walker Kehr, MD

## 2019-07-01 NOTE — Assessment & Plan Note (Signed)
BP Readings from Last 3 Encounters:  07/01/19 (!) 146/76  05/20/19 (!) 180/82  02/12/19 (!) 150/86

## 2019-07-01 NOTE — Assessment & Plan Note (Signed)
Feeling well and eating well

## 2019-07-02 ENCOUNTER — Telehealth: Payer: Self-pay | Admitting: Internal Medicine

## 2019-07-02 NOTE — Telephone Encounter (Signed)
Called pt and informed her of her lab result per Dr. Alain Marion. Pt understood and had no additional questions at this time. Nothing further is needed    Notes recorded by Plotnikov, Evie Lacks, MD on 07/02/2019 at 8:58 AM EDT  Breanna [name],  Please inform the patient that all labs are stable, except for elev creatinine - drink more liquids pls.  Thanks,  AP

## 2019-07-02 NOTE — Telephone Encounter (Signed)
Copied from [place] [id number]. Topic: Quick Communication - Lab Results (Clinic Use ONLY) >> Jul 02, 2019 11:28 AM Cresenciano Lick, CMA wrote: Left message for pt to call back Re: recent labs.  [facility] may inform patient of results.  Pt calling back for results.

## 2019-07-09 NOTE — Progress Notes (Addendum)
Subjective:   Breanna [name] is a 85 y.o. female who presents for Medicare Annual (Subsequent) preventive examination. I connected with patient by a telephone and verified that I am speaking with the correct person using two identifiers. Patient stated full name and DOB. Patient gave permission to continue with telephonic visit. Patient's location was at home and Nurse's location was at [place] office.   Review of Systems:  Cardiac Risk Factors include: advanced age (>87men, >80 women);diabetes mellitus;dyslipidemia;hypertension Sleep patterns: feels rested on waking, gets up 1-2 times nightly to void and sleeps 7-8 hours nightly.    Home Safety/Smoke Alarms: Feels safe in home. Smoke alarms in place.  Living environment; residence and Firearm Safety: 1-story house/ trailer. Lives alone, no needs for DME, good support system Seat Belt Safety/Bike Helmet: Wears seat belt.     Objective:     Vitals: There were no vitals taken for this visit.  There is no height or weight on file to calculate BMI.  Advanced Directives 07/10/2019 07/07/2018 05/15/2018 07/04/2017 05/30/2015 01/12/2015 03/24/2013  Does Patient Have a Medical Advance Directive? Yes No No Yes Yes No Patient does not have advance directive;Patient would not like information  Type of Scientist, forensic Power of [place];Living will - - [place];Living will - - -  Copy of [place] in Chart? No - copy requested - - No - copy requested Yes - -  Would patient like information on creating a medical advance directive? - No - Patient declined Yes (MAU/Ambulatory/Procedural Areas - Information given) - - No - patient declined information -  Pre-existing out of facility DNR order (yellow form or pink MOST form) - - - - - - No    Tobacco Social History   Tobacco Use  Smoking Status Never Smoker  Smokeless Tobacco Never Used     Counseling given: Not Answered  Past Medical History:  Diagnosis  Date  . Adrenal adenoma 2006  . Boils 2009  . Cholelithiasis    asympt. w/normal HIDA 03/2010  . CVA (cerebral infarction) 2010   Cerebellar  . Diverticulitis   . Esophageal spasm 2011  . GERD (gastroesophageal reflux disease)   . Gout   . History of colon polyps   . HTN (hypertension)   . Hydronephrosis    LEFT/ Surgical intervention  . Hyperlipidemia   . LBP (low back pain)   . Osteoarthritis   . Pulmonary HTN ([place])   . Stress   . Type II or unspecified type diabetes mellitus without mention of complication, not stated as uncontrolled    Past Surgical History:  Procedure Laterality Date  . ABDOMINAL HYSTERECTOMY    . BACK SURGERY    . BREAST BIOPSY     RIGHT  . CATARACT EXTRACTION, BILATERAL    . COLECTOMY  2006   Sigmoid  . FOOT SURGERY     BILATERAL  . HAMMER TOE SURGERY    . ROTATOR CUFF REPAIR  2008   RIGHT  . TOTAL KNEE ARTHROPLASTY  2003   LEFT  . VARICOSE VEIN SURGERY     vein stripping/lower extremities   Family History  Problem Relation Age of Onset  . Prostate cancer Father   . Hypertension Father   . Cancer Father        prostate  . Heart disease Mother   . Diabetes Mother   . Diabetes Other        1st degree relative  . Heart disease Other   .  Hypertension Other   . Prostate cancer Maternal Uncle   . Cancer Maternal Uncle        prostate  . Breast cancer Daughter   . Cancer Daughter        breast  . Colon cancer Neg Hx    Social History   Socioeconomic History  . Marital status: Widowed    Spouse name: Jacinda Kanady  . Number of children: 1  . Years of education: Not on file  . Highest education level: Not on file  Occupational History  . Occupation: Retired    Fish farm manager: RETIRED  Social Needs  . Financial resource strain: Not hard at all  . Food insecurity    Worry: Never true    Inability: Never true  . Transportation needs    Medical: No    Non-medical: No  Tobacco Use  . Smoking status: Never Smoker  . Smokeless tobacco:  Never Used  Substance and Sexual Activity  . Alcohol use: No    Alcohol/week: 0.0 standard drinks  . Drug use: No  . Sexual activity: Not Currently  Lifestyle  . Physical activity    Days per week: 0 days    Minutes per session: 0 min  . Stress: Not at all  Relationships  . Social connections    Talks on phone: More than three times a week    Gets together: More than three times a week    Attends religious service: More than 4 times per year    Active member of club or organization: Yes    Attends meetings of clubs or organizations: More than 4 times per year    Relationship status: Widowed  Other Topics Concern  . Not on file  Social History Narrative  . Not on file    Outpatient Encounter Medications as of 07/10/2019  Medication Sig  . Acetaminophen (TYLENOL ARTHRITIS PAIN PO) Take by mouth.  Marland Kitchen allopurinol (ZYLOPRIM) 300 MG tablet TAKE 1 TABLET BY MOUTH EVERY DAY  . amLODipine (NORVASC) 5 MG tablet TAKE 1 TABLET BY MOUTH EVERY DAY  . aspirin 81 MG EC tablet Take 81 mg by mouth daily.    . brimonidine (ALPHAGAN) 0.15 % ophthalmic solution 1 drop.  Marland Kitchen CALCIUM PO Take by mouth daily.  . carvedilol (COREG) 25 MG tablet TAKE 1 TABLET BY MOUTH TWICE A DAY WITH A MEAL  . Cholecalciferol (VITAMIN D3) 2000 UNITS capsule Take 2,000 Units by mouth daily.    . cyanocobalamin 2000 MCG tablet Take 2,000 mcg by mouth daily.  . diclofenac sodium (VOLTAREN) 1 % GEL Apply 2 g topically 2 (two) times daily.  . diphenhydramine-acetaminophen (TYLENOL PM) 25-500 MG TABS 1 tablet at bedtime as needed.  . dorzolamide-timolol (COSOPT) 22.3-6.8 MG/ML ophthalmic solution 1 drop.  Marland Kitchen gabapentin (NEURONTIN) 100 MG capsule TAKE 1 CAPSULE BY MOUTH THREE TIMES A DAY  . latanoprost (XALATAN) 0.005 % ophthalmic solution Place 1 drop into both eyes 2 (two) times daily.  Marland Kitchen losartan (COZAAR) 50 MG tablet TAKE 1 TABLET BY MOUTH EVERY DAY  . metFORMIN (GLUCOPHAGE) 500 MG tablet Take 1 tablet (500 mg total) by  mouth daily with breakfast.  . NON FORMULARY CBD oil  . pantoprazole (PROTONIX) 40 MG tablet TAKE 1 TABLET BY MOUTH EVERY DAY  . sertraline (ZOLOFT) 100 MG tablet TAKE 1 TABLET BY MOUTH EVERY DAY  . spironolactone (ALDACTONE) 25 MG tablet Take 1 tablet (25 mg total) by mouth daily.  . timolol (TIMOPTIC) 0.25 % ophthalmic solution   .  torsemide (DEMADEX) 20 MG tablet TAKE 1/2 TABLET (10MG ) BY MOUTH DAILY AS NEEDED FOR EDEMA (SWELLING)   No facility-administered encounter medications on file as of 07/10/2019.     Activities of Daily Living In your present state of health, do you have any difficulty performing the following activities: 07/10/2019  Hearing? N  Vision? N  Difficulty concentrating or making decisions? N  Walking or climbing stairs? N  Dressing or bathing? N  Doing errands, shopping? N  Preparing Food and eating ? N  Using the Toilet? N  In the past six months, have you accidently leaked urine? N  Do you have problems with loss of bowel control? N  Managing your Medications? N  Managing your Finances? N  Housekeeping or managing your Housekeeping? N  Some recent data might be hidden    Patient Care Team: Plotnikov, Evie Lacks, MD as PCP - General (Internal Medicine) Carolan Clines, MD (Inactive) as Consulting Physician (Urology) Melina Schools, MD as Consulting Physician (Orthopedic Surgery)    Assessment:   This is a routine wellness examination for Kiley. Physical assessment deferred to PCP.  Exercise Activities and Dietary recommendations Current Exercise Habits: Home exercise routine, Type of exercise: walking, Time (Minutes): 25, Frequency (Times/Week): 4, Weekly Exercise (Minutes/Week): 100, Exercise limited by: orthopedic condition(s)  Diet (meal preparation, eat out, water intake, caffeinated beverages, dairy products, fruits and vegetables): in general, a "healthy" diet  , well balanced   [employer] on Wheels. Encouraged patient to increase daily  water and healthy fluid intake. Encouraged patient to increase daily water and healthy fluid intake.  Goals      Patient Stated   . divsion for the blind (pt-stated)     Will contact the Div for the blind to assist the patient in independent living with vision loss Counties Served: [place], [place], [place], [place], [place], [place], [place], [place], [place], [place], [place], [place], [place], [place], [place], [name] Phone: [phone] Toll Free: [phone] Fax: [phone] Email: [email or web address] Physical Address [address], [address] [place], [place] [address]     . transportation (pt-stated)     Will explore transportation in [place] / for [employer]: [employer]:  [phone]  / transaid      Other   . I want to increase the amount of water I drink     I will take out 3 bottles of water to drink daily. I will place them on the table so I do not forget.    . Patient Stated     Continue to be socially engaged with family and friends. Enjoy life.       Fall Risk Fall Risk  07/10/2019 07/07/2018 07/04/2017 06/07/2017 06/04/2016  Falls in the past year? 0 Yes No No No  Comment - - - - -  Number falls in past yr: 0 1 - - -  Injury with Fall? - No - - -  Risk for fall due to : Impaired vision Impaired mobility;Impaired balance/gait - - -  Follow up - Education provided;Falls prevention discussed - - -    Depression Screen PHQ 2/9 Scores 07/10/2019 07/07/2018 07/04/2017 06/07/2017  PHQ - 2 Score 0 0 0 0  Exception Documentation - - - -     Cognitive Function MMSE - Mini Mental State Exam 07/07/2018 07/04/2017 05/30/2015  Not completed: - - Unable to complete  Orientation to time 5 5 -  Orientation to Place 5 5 -  Registration 3 3 -  Attention/ Calculation  4 4 -  Recall 1 2 -  Language- name 2 objects 2 2 -  Language- repeat 1 1 -  Language- follow 3 step command 3 3 -  Language- read & follow direction 1 1 -  Write  a sentence 1 1 -  Copy design 1 1 -  Total score 27 28 -        Immunization History  Administered Date(s) Administered  . H1N1 01/06/2009  . Influenza Split 10/01/2011, 09/08/2012  . Influenza Whole 10/07/2009, 09/29/2010  . Influenza, High Dose Seasonal PF 10/06/2013, 11/21/2016, 09/25/2017, 09/22/2018  . Influenza-Unspecified 10/09/2014, 09/09/2015  . Pneumococcal Conjugate-13 04/07/2014  . Pneumococcal Polysaccharide-23 06/26/2007, 03/14/2018  . Td 06/26/2007  . Tdap 05/13/2018  . Zoster 10/04/2011   Screening Tests Health Maintenance  Topic Date Due  . OPHTHALMOLOGY EXAM  02/25/2019  . FOOT EXAM  07/08/2019  . INFLUENZA VACCINE  07/25/2019  . HEMOGLOBIN A1C  01/01/2020  . TETANUS/TDAP  05/13/2028  . DEXA SCAN  Completed  . PNA vac Low Risk Adult  Completed      Plan:     Reviewed health maintenance screenings with patient today and relevant education, vaccines, and/or referrals were provided.   Continue to eat heart healthy diet (full of fruits, vegetables, whole grains, lean protein, water--limit salt, fat, and sugar intake) and increase physical activity as tolerated.  Continue doing brain stimulating activities (puzzles, reading, adult coloring books, staying active) to keep memory sharp.   I have personally reviewed and noted the following in the patient's chart:   . Medical and social history . Use of alcohol, tobacco or illicit drugs  . Current medications and supplements . Functional ability and status . Nutritional status . Physical activity . Advanced directives . List of other physicians . Screenings to include cognitive, depression, and falls . Referrals and appointments  In addition, I have reviewed and discussed with patient certain preventive protocols, quality metrics, and best practice recommendations. A written personalized care plan for preventive services as well as general preventive health recommendations were provided to patient.      Michiel Cowboy, RN  07/10/2019    Medical screening examination/treatment/procedure(s) were performed by non-physician practitioner and as supervising physician I was immediately available for consultation/collaboration. I agree with above. Binnie Rail, MD

## 2019-07-10 ENCOUNTER — Ambulatory Visit (INDEPENDENT_AMBULATORY_CARE_PROVIDER_SITE_OTHER): Payer: Medicare Other | Admitting: *Deleted

## 2019-07-10 DIAGNOSIS — Z0000 Encounter for general adult medical examination without abnormal findings: Secondary | ICD-10-CM

## 2019-07-23 ENCOUNTER — Ambulatory Visit
Admission: RE | Admit: 2019-07-23 | Discharge: 2019-07-23 | Disposition: A | Discharge status: Home / Self Care | Payer: Medicare Other | Source: Ambulatory Visit | Attending: Internal Medicine | Admitting: Internal Medicine

## 2019-07-23 ENCOUNTER — Other Ambulatory Visit: Payer: Self-pay

## 2019-07-23 DIAGNOSIS — Z1231 Encounter for screening mammogram for malignant neoplasm of breast: Secondary | ICD-10-CM

## 2019-08-19 ENCOUNTER — Other Ambulatory Visit: Payer: Self-pay

## 2019-08-19 ENCOUNTER — Ambulatory Visit: Payer: Medicare Other | Attending: Ophthalmology | Admitting: Occupational Therapy

## 2019-08-19 DIAGNOSIS — R41842 Visuospatial deficit: Secondary | ICD-10-CM | POA: Diagnosis not present

## 2019-08-19 NOTE — Patient Instructions (Addendum)
You have been provided with information regarding purchase of the following items:  5x stand magnifier 5x handheld magnifier, Handheld video magnifier Binocular spectacles Check writing guide. You may purchase these items through [employer]. I am not affiliated with this company however they have items that could be helpful to you. You may want to contact your caseworker from [facility], they may be able to help provide some of these items.  Curt Bears Rayshawn Visconti, OTR/L

## 2019-08-20 NOTE — Therapy (Signed)
[facility] [address] [place], [place], [address] Phone: [phone]   Fax:  [phone]  Occupational Therapy Evaluation  Patient Details  Name: Breanna [name] MRN: [id number] Date of Birth: [birth year] Referring Provider (OT): Dr. Susa Simmonds   Encounter Date: 08/19/2019  OT End of Session - 08/19/19 1328    Visit Number  1    Number of Visits  1    Date for OT Re-Evaluation  --   n/a   Authorization Type  [employer] Medicare    OT Start Time  1325    OT Stop Time  1419    OT Time Calculation (min)  54 min       Past Medical History:  Diagnosis Date  . Adrenal adenoma 2006  . Boils 2009  . Cholelithiasis    asympt. w/normal HIDA 03/2010  . CVA (cerebral infarction) 2010   Cerebellar  . Diverticulitis   . Esophageal spasm 2011  . GERD (gastroesophageal reflux disease)   . Gout   . History of colon polyps   . HTN (hypertension)   . Hydronephrosis    LEFT/ Surgical intervention  . Hyperlipidemia   . LBP (low back pain)   . Osteoarthritis   . Pulmonary HTN ([place])   . Stress   . Type II or unspecified type diabetes mellitus without mention of complication, not stated as uncontrolled     Past Surgical History:  Procedure Laterality Date  . ABDOMINAL HYSTERECTOMY    . BACK SURGERY    . BREAST BIOPSY     RIGHT  . CATARACT EXTRACTION, BILATERAL    . COLECTOMY  2006   Sigmoid  . FOOT SURGERY     BILATERAL  . HAMMER TOE SURGERY    . ROTATOR CUFF REPAIR  2008   RIGHT  . TOTAL KNEE ARTHROPLASTY  2003   LEFT  . VARICOSE VEIN SURGERY     vein stripping/lower extremities    There were no vitals filed for this visit.  Subjective Assessment - 08/20/19 1326    Patient Stated Goals  Pt wants to be able to read better    Currently in Pain?  No/denies        [facility] OT Assessment - 08/20/19 0001      Assessment   Medical Diagnosis  glaucoma, caracts    Referring Provider (OT)  Dr. Susa Simmonds    Onset  Date/Surgical Date  07/22/19      Precautions   Precautions  Other (comment)   low vision     Balance Screen   Has the patient fallen in the past 6 months  Yes    How many times?  4   Pt reports wearing slides and she tripped   Has the patient had a decrease in activity level because of a fear of falling?   Yes    Is the patient reluctant to leave their home because of a fear of falling?   Yes      Home  Environment   Family/patient expects to be discharged to:  Private residence    Lives With  Alone   son lives nearby     Prior Function   Level of Independence  Independent with basic ADLs    Leisure  visiting with neighbors      ADL   ADL comments  modified indpendent with all basic ADLs      IADL   Meal Prep  Able to complete simple cold meal  and snack prep   has meals on wheels, microwaves    Medication Management  --   Pt fills pillbox  for the week     Mobility   Mobility Status  --   supervision-modified indpendent, uses cane     Vision - History   Baseline Vision  Wears glasses all the time    Visual History  Glaucoma      Vision Assessment   Vision Assessment  Vision tested    Visual Acuity  Per MD/OD report    Per MD/OD Report  OD 20/80, OS 20/60   Pt reports NLP in left eye   Reading Acuity  (1.0)    Patient has diffculty with activities due to visual impairment  Writing checks;Reading bills    Comment  Pt reports difficulty seeing TV and reading      Cognition   Overall Cognitive Status  Within Functional Limits for tasks assessed                      OT Education - 08/20/19 1332    Education Details  Pt was educated regarding use of the following:5x stand magnifier, 5x handheld magnifier,Handheld video magnifier,Binocular spectacles,Check writing guide. Pt verbalized understanding of use. Therapist recommended pt contacts her caseworker from services for the Blind to see if they will provide any of these items. Pt was provided with  information for purchase through [employer]. Pt was educated that OT is unable to make glasses, as pt was seeking glasses to correct her visual deficits. Pt was instructed to speak with her opthamologist.   Person(s) Educated  Patient    Methods  Explanation;Demonstration;Verbal cues;Handout    Comprehension  Verbalized understanding;Returned demonstration;Verbal cues required                 Plan - 08/20/19 1327    Clinical Impression Statement  Pt is an 85 y.o female with a diagnosis of glaucoma and cataracts who presents with visual impairements which impede performance of ADLs/IADLS and reading ability. Pt was seen for evaluation and treatment on day of evaluation for strategies to compensate for visual deficits and information regarding AE.    OT Occupational Profile and History  Problem Focused Assessment - Including review of records relating to presenting problem    Occupational performance deficits (Please refer to evaluation for details):  ADL's;IADL's;Leisure;Social Participation    Body Structure / Function / Physical Skills  ADL;Vision;Decreased knowledge of use of DME;Decreased knowledge of precautions    Rehab Potential  Good    Clinical Decision Making  Limited treatment options, no task modification necessary    Comorbidities Affecting Occupational Performance:  May have comorbidities impacting occupational performance    Modification or Assistance to Complete Evaluation   No modification of tasks or assist necessary to complete eval    OT Frequency  One time visit    OT Duration  12 weeks    OT Treatment/Interventions  Self-care/ADL training;Visual/perceptual remediation/compensation;Patient/family education;DME and/or AE instruction    Plan  Pt was seen for treatment on day of evalaution, no additional visits are recommended at this time.    Consulted and Agree with Plan of Care  Patient;Family member/caregiver   son was in waiting room      Patient  will benefit from skilled therapeutic intervention in order to improve the following deficits and impairments:   Body Structure / Function / Physical Skills: ADL, Vision, Decreased knowledge of use of DME, Decreased knowledge  of precautions       Visit Diagnosis: Visuospatial deficit    Problem List Patient Active Problem List   Diagnosis Date Noted  . Fall against object 12/25/2018  . Head contusion 12/25/2018  . Acute pain of right knee 09/28/2018  . Well adult exam 05/13/2018  . Herpes labialis 05/13/2018  . Night sweats 04/04/2018  . Breast symptom 10/30/2017  . Diarrhea 08/21/2017  . UTI (urinary tract infection) 08/21/2017  . Lung nodule 08/21/2017  . FTT (failure to thrive) in adult 08/21/2017  . Shortness of breath 08/13/2017  . Cough 08/13/2017  . Fatigue 08/13/2017  . Fever 08/13/2017  . Blurry vision 08/13/2017  . Shoulder pain, right 07/25/2017  . Callus of foot 06/07/2017  . Need for prophylactic vaccination and inoculation against influenza 11/21/2016  . Trigger finger, acquired 11/12/2016  . Meteorism 07/11/2016  . Dark stools 01/10/2016  . Osteoporosis 07/13/2015  . Fall at home 01/13/2015  . Cornea disorder 11/22/2014  . Primary open angle glaucoma of both eyes, indeterminate stage 11/22/2014  . Secondary corneal edema 10/14/2014  . Swelling of left knee joint 04/07/2014  . Grief 01/04/2014  . Syncope 01/04/2014  . Thoracic spine pain 10/22/2013  . Hypernatremia 07/07/2013  . Viral intestinal infection 03/25/2013  . Dehydration, moderate 03/24/2013  . Food poisoning 03/24/2013  . Headache(784.0) 12/22/2012  . URI (upper respiratory infection) 10/21/2012  . Diabetic neuropathy ([place]) 07/30/2012  . Neck pain on right side 07/30/2012  . Cerumen impaction 07/17/2012  . Cystoid macular edema 06/19/2012  . History of corneal transplant 06/19/2012  . Weight loss 04/10/2012  . Wrist pain, right 04/06/2011  . PARESTHESIA 01/03/2011  . FULL  INCONTINENCE OF FECES 01/01/2011  . ABDOMINAL PAIN RIGHT UPPER QUADRANT 01/01/2011  . LEG PAIN, LEFT 10/02/2010  . Cerebral artery occlusion with cerebral infarction ([place]) 03/27/2010  . GERD 03/27/2010  . DIVERTICULOSIS-COLON 03/27/2010  . CHOLELITHIASIS 03/27/2010  . LOW BACK PAIN 09/07/2009  . ANEMIA, IRON DEFICIENCY, HX OF 06/14/2009  . DYSPHAGIA 05/16/2009  . Dysphagia 05/16/2009  . HYPERLIPIDEMIA 11/06/2007  . Type 2 diabetes mellitus with renal manifestations, controlled ([place]) 07/29/2007  . Gout 07/29/2007  . Essential hypertension 07/29/2007  . HYDRONEPHROSIS 07/29/2007  . Osteoarthritis 07/29/2007  . COLONIC POLYPS, HX OF 07/29/2007  . DIVERTICULITIS, HX OF 07/29/2007    Chimene Salo 08/20/2019, 1:35 PM Theone Murdoch, OTR/L [phone] Phone: [phone] 1:36 PM 08/20/19 [facility] [address] [place], [place], [address] Phone: [phone]   Fax:  [phone]  Name: Breanna [name] MRN: [id number] Date of Birth: Jul 13, 1931

## 2019-09-07 ENCOUNTER — Other Ambulatory Visit: Payer: Self-pay | Admitting: Internal Medicine

## 2019-09-11 ENCOUNTER — Other Ambulatory Visit: Payer: Self-pay | Admitting: Internal Medicine

## 2019-09-18 ENCOUNTER — Other Ambulatory Visit: Payer: Self-pay | Admitting: Internal Medicine

## 2019-10-06 ENCOUNTER — Ambulatory Visit: Payer: Medicare Other | Admitting: Sports Medicine

## 2019-10-14 ENCOUNTER — Other Ambulatory Visit: Payer: Self-pay | Admitting: Internal Medicine

## 2019-10-27 ENCOUNTER — Ambulatory Visit (INDEPENDENT_AMBULATORY_CARE_PROVIDER_SITE_OTHER): Payer: Medicare Other

## 2019-10-27 ENCOUNTER — Other Ambulatory Visit: Payer: Self-pay

## 2019-10-27 ENCOUNTER — Encounter: Payer: Self-pay | Admitting: Sports Medicine

## 2019-10-27 ENCOUNTER — Ambulatory Visit: Payer: Medicare Other | Admitting: Sports Medicine

## 2019-10-27 DIAGNOSIS — M79675 Pain in left toe(s): Secondary | ICD-10-CM

## 2019-10-27 DIAGNOSIS — M2041 Other hammer toe(s) (acquired), right foot: Secondary | ICD-10-CM

## 2019-10-27 DIAGNOSIS — M2142 Flat foot [pes planus] (acquired), left foot: Secondary | ICD-10-CM

## 2019-10-27 DIAGNOSIS — M2042 Other hammer toe(s) (acquired), left foot: Secondary | ICD-10-CM

## 2019-10-27 DIAGNOSIS — E119 Type 2 diabetes mellitus without complications: Secondary | ICD-10-CM

## 2019-10-27 DIAGNOSIS — B351 Tinea unguium: Secondary | ICD-10-CM

## 2019-10-27 DIAGNOSIS — M79674 Pain in right toe(s): Secondary | ICD-10-CM

## 2019-10-27 DIAGNOSIS — M2141 Flat foot [pes planus] (acquired), right foot: Secondary | ICD-10-CM

## 2019-10-27 NOTE — Progress Notes (Signed)
Subjective: Breanna [name] is a 85 y.o. female patient with history of diabetes who returns to office today complaining of long,mildly painful nails  while ambulating in shoes; unable to trim. Patient states that she has been getting some pain occasionally to the left third toe greater than the right third toe and noticed that there is a little bit of thick skin at the tip.  Patient reports that pain is worse when she is in shoes.  Patient is assisted by son this visit and reports that her last blood sugar was not recorded. Last A1c 6.4. No other pedal complaints.  Patient Active Problem List   Diagnosis Date Noted  . Fall against object 12/25/2018  . Head contusion 12/25/2018  . Acute pain of right knee 09/28/2018  . Well adult exam 05/13/2018  . Herpes labialis 05/13/2018  . Night sweats 04/04/2018  . Breast symptom 10/30/2017  . Diarrhea 08/21/2017  . UTI (urinary tract infection) 08/21/2017  . Lung nodule 08/21/2017  . FTT (failure to thrive) in adult 08/21/2017  . Shortness of breath 08/13/2017  . Cough 08/13/2017  . Fatigue 08/13/2017  . Fever 08/13/2017  . Blurry vision 08/13/2017  . Shoulder pain, right 07/25/2017  . Callus of foot 06/07/2017  . Need for prophylactic vaccination and inoculation against influenza 11/21/2016  . Trigger finger, acquired 11/12/2016  . Meteorism 07/11/2016  . Dark stools 01/10/2016  . Osteoporosis 07/13/2015  . Fall at home 01/13/2015  . Cornea disorder 11/22/2014  . Primary open angle glaucoma of both eyes, indeterminate stage 11/22/2014  . Secondary corneal edema 10/14/2014  . Swelling of left knee joint 04/07/2014  . Grief 01/04/2014  . Syncope 01/04/2014  . Thoracic spine pain 10/22/2013  . Hypernatremia 07/07/2013  . Viral intestinal infection 03/25/2013  . Dehydration, moderate 03/24/2013  . Food poisoning 03/24/2013  . Headache(784.0) 12/22/2012  . URI (upper respiratory infection) 10/21/2012  . Diabetic neuropathy ([place])  07/30/2012  . Neck pain on right side 07/30/2012  . Cerumen impaction 07/17/2012  . Cystoid macular edema 06/19/2012  . History of corneal transplant 06/19/2012  . Weight loss 04/10/2012  . Wrist pain, right 04/06/2011  . PARESTHESIA 01/03/2011  . FULL INCONTINENCE OF FECES 01/01/2011  . ABDOMINAL PAIN RIGHT UPPER QUADRANT 01/01/2011  . LEG PAIN, LEFT 10/02/2010  . Cerebral artery occlusion with cerebral infarction ([place]) 03/27/2010  . GERD 03/27/2010  . DIVERTICULOSIS-COLON 03/27/2010  . CHOLELITHIASIS 03/27/2010  . LOW BACK PAIN 09/07/2009  . ANEMIA, IRON DEFICIENCY, HX OF 06/14/2009  . DYSPHAGIA 05/16/2009  . Dysphagia 05/16/2009  . HYPERLIPIDEMIA 11/06/2007  . Type 2 diabetes mellitus with renal manifestations, controlled ([place]) 07/29/2007  . Gout 07/29/2007  . Essential hypertension 07/29/2007  . HYDRONEPHROSIS 07/29/2007  . Osteoarthritis 07/29/2007  . COLONIC POLYPS, HX OF 07/29/2007  . DIVERTICULITIS, HX OF 07/29/2007   Current Outpatient Medications on File Prior to Visit  Medication Sig Dispense Refill  . Acetaminophen (TYLENOL ARTHRITIS PAIN PO) Take by mouth.    Marland Kitchen allopurinol (ZYLOPRIM) 300 MG tablet TAKE 1 TABLET BY MOUTH EVERY DAY 90 tablet 3  . amLODipine (NORVASC) 5 MG tablet TAKE 1 TABLET BY MOUTH EVERY DAY 90 tablet 3  . aspirin 81 MG EC tablet Take 81 mg by mouth daily.      Marland Kitchen atropine 1 % ophthalmic solution Place 1 drop into the left eye daily.    . brimonidine (ALPHAGAN) 0.15 % ophthalmic solution 1 drop.    Marland Kitchen CALCIUM PO Take by mouth daily.    Marland Kitchen  carvedilol (COREG) 25 MG tablet TAKE 1 TABLET BY MOUTH TWICE A DAY WITH A MEAL 180 tablet 3  . Cholecalciferol (VITAMIN D3) 2000 UNITS capsule Take 2,000 Units by mouth daily.      . cyanocobalamin 2000 MCG tablet Take 2,000 mcg by mouth daily.    . diclofenac sodium (VOLTAREN) 1 % GEL Apply 2 g topically 2 (two) times daily. 200 g 3  . diphenhydramine-acetaminophen (TYLENOL PM) 25-500 MG TABS 1 tablet at bedtime  as needed.    . dorzolamide-timolol (COSOPT) 22.3-6.8 MG/ML ophthalmic solution 1 drop.    Marland Kitchen gabapentin (NEURONTIN) 100 MG capsule TAKE 1 CAPSULE BY MOUTH THREE TIMES A DAY 270 capsule 1  . latanoprost (XALATAN) 0.005 % ophthalmic solution Place 1 drop into both eyes 2 (two) times daily.    Marland Kitchen losartan (COZAAR) 25 MG tablet TAKE 2 TABLETS BY MOUTH EVERY DAY 180 tablet 3  . metFORMIN (GLUCOPHAGE) 500 MG tablet TAKE 1 TABLET BY MOUTH EVERY DAY WITH BREAKFAST 90 tablet 3  . NON FORMULARY CBD oil    . pantoprazole (PROTONIX) 40 MG tablet TAKE 1 TABLET BY MOUTH EVERY DAY 90 tablet 1  . sertraline (ZOLOFT) 100 MG tablet TAKE 1 TABLET BY MOUTH EVERY DAY 90 tablet 1  . spironolactone (ALDACTONE) 25 MG tablet Take 1 tablet (25 mg total) by mouth daily. 30 tablet 11  . timolol (TIMOPTIC) 0.25 % ophthalmic solution     . torsemide (DEMADEX) 20 MG tablet TAKE 1/2 TABLET (10MG ) BY MOUTH DAILY AS NEEDED FOR EDEMA (SWELLING) 90 tablet 2   No current facility-administered medications on file prior to visit.    Allergies  Allergen Reactions  . Aspirin Itching    But tolerates low dose  . Atenolol     REACTION: fatigue  . Codeine Itching  . Codeine Sulfate   . Hydrochlorothiazide     REACTION: gout  . Hydrocodone       side effects - hallucinations  . Ibuprofen     Upset stomach w/high doses  . Lisinopril     REACTION: cough  . Onion Other (See Comments)    Dry mouth/ gets sores  . Valsartan Itching  . Verapamil     REACTION: SOB  . Adhesive  [Tape] Rash    No results found for this or any previous visit (from the past 2160 hour(s)).  Objective: General: Patient is awake, alert, and oriented x 3 and in no acute distress.  Integument: Skin is warm, dry and supple bilateral. Nails are tender, long, thickened and dystrophic with subungual debris, consistent with onychomycosis, 1-5 on left and 1,3-5 on right. No signs of infection. Minimal callus at distal tuft at 3rd toes bilateral. Remaining  integument unremarkable.  Vasculature:  Dorsalis Pedis pulse 1/4 bilateral. Posterior Tibial pulse  1/4 bilateral. Capillary fill time <5 sec 1-5 bilateral. Scant hair growth to the level of the digits. Temperature gradient within normal limits. + varicosities present bilateral. No edema present bilateral.   Neurology: The patient has diminished sensation measured with a 5.07/10g Semmes Weinstein Monofilament at all pedal sites bilateral . Vibratory sensation diminished bilateral with tuning fork. No Babinski sign present bilateral.   Musculoskeletal: Asymptomatic hammertoe and pes planus pedal deformities noted bilateral. Muscular strength 5/5 in all lower extremity muscular groups bilateral without pain on range of motion. No tenderness with calf compression bilateral.  X-ray left foot consistent with hammertoe decreased osseous mineralization consistent with patient age and areas of arthritis that are currently asymptomatic.  Assessment and Plan: Problem List Items Addressed This Visit    None    Visit Diagnoses    Hammertoe of left foot    -  Primary   Relevant Orders   DG Foot Complete Left (Completed)   Pain due to onychomycosis of toenails of both feet       Hammer toes of both feet       Pes planus of both feet       Diabetes mellitus without complication ([place])          -Examined patient. -X-rays reviewed -Discussed and educated patient on diabetic foot care, especially with  regards to the vascular, neurological and musculoskeletal systems.  -Mechanically debrided all nails 1-5 bilateral using sterile nail nipper and filed with dremel without incident  -Mechanically debrided callus at tips of third toes bilateral using a sterile 15 blade without incident -Dispensed toe cushions to use as instructed -Patient to see Liliane Channel for diabetic shoes and advised to make a appointment for this -Answered all patient questions -Patient to return  in 3 months for at risk foot care and for  diabetic shoe measurements as scheduled -Patient advised to call the office if any problems or questions arise in the meantime.  Landis Martins, DPM

## 2019-11-02 ENCOUNTER — Encounter: Payer: Self-pay | Admitting: Internal Medicine

## 2019-11-02 ENCOUNTER — Other Ambulatory Visit: Payer: Self-pay

## 2019-11-02 ENCOUNTER — Ambulatory Visit (INDEPENDENT_AMBULATORY_CARE_PROVIDER_SITE_OTHER): Payer: Medicare Other | Admitting: Internal Medicine

## 2019-11-02 DIAGNOSIS — M1000 Idiopathic gout, unspecified site: Secondary | ICD-10-CM

## 2019-11-02 DIAGNOSIS — W19XXXD Unspecified fall, subsequent encounter: Secondary | ICD-10-CM

## 2019-11-02 DIAGNOSIS — E785 Hyperlipidemia, unspecified: Secondary | ICD-10-CM

## 2019-11-02 DIAGNOSIS — I10 Essential (primary) hypertension: Secondary | ICD-10-CM

## 2019-11-02 DIAGNOSIS — Y92009 Unspecified place in unspecified non-institutional (private) residence as the place of occurrence of the external cause: Secondary | ICD-10-CM

## 2019-11-02 DIAGNOSIS — E1121 Type 2 diabetes mellitus with diabetic nephropathy: Secondary | ICD-10-CM

## 2019-11-02 NOTE — Assessment & Plan Note (Signed)
Remote Walker, cane

## 2019-11-02 NOTE — Assessment & Plan Note (Signed)
No relapse 

## 2019-11-02 NOTE — Assessment & Plan Note (Signed)
Not taking statins

## 2019-11-02 NOTE — Assessment & Plan Note (Signed)
Labs

## 2019-11-02 NOTE — Patient Instructions (Signed)
If you have medicare related insurance (such as traditional Medicare, Blue Cross Medicare, United HealthCare Medicare, or similar), Please make an appointment at the scheduling desk with Jill, the Wellness Health Coach, for your Wellness visit in this office, which is a benefit with your insurance.  

## 2019-11-02 NOTE — Progress Notes (Signed)
Subjective:  Patient ID: Breanna [name], female    DOB: 10/06/1931  Age: 85 y.o. MRN: [id number]  CC: No chief complaint on file.   HPI [facility] presents for HTN, DM, CAD f/u C/o unsteady gait  Outpatient Medications Prior to Visit  Medication Sig Dispense Refill  . Acetaminophen (TYLENOL ARTHRITIS PAIN PO) Take by mouth.    Marland Kitchen allopurinol (ZYLOPRIM) 300 MG tablet TAKE 1 TABLET BY MOUTH EVERY DAY 90 tablet 3  . amLODipine (NORVASC) 5 MG tablet TAKE 1 TABLET BY MOUTH EVERY DAY 90 tablet 3  . aspirin 81 MG EC tablet Take 81 mg by mouth daily.      Marland Kitchen atropine 1 % ophthalmic solution Place 1 drop into the left eye daily.    . brimonidine (ALPHAGAN) 0.15 % ophthalmic solution 1 drop.    Marland Kitchen CALCIUM PO Take by mouth daily.    . carvedilol (COREG) 25 MG tablet TAKE 1 TABLET BY MOUTH TWICE A DAY WITH A MEAL 180 tablet 3  . Cholecalciferol (VITAMIN D3) 2000 UNITS capsule Take 2,000 Units by mouth daily.      . cyanocobalamin 2000 MCG tablet Take 2,000 mcg by mouth daily.    . diclofenac sodium (VOLTAREN) 1 % GEL Apply 2 g topically 2 (two) times daily. 200 g 3  . diphenhydramine-acetaminophen (TYLENOL PM) 25-500 MG TABS 1 tablet at bedtime as needed.    . dorzolamide-timolol (COSOPT) 22.3-6.8 MG/ML ophthalmic solution 1 drop.    Marland Kitchen gabapentin (NEURONTIN) 100 MG capsule TAKE 1 CAPSULE BY MOUTH THREE TIMES A DAY 270 capsule 1  . latanoprost (XALATAN) 0.005 % ophthalmic solution Place 1 drop into both eyes 2 (two) times daily.    Marland Kitchen losartan (COZAAR) 25 MG tablet TAKE 2 TABLETS BY MOUTH EVERY DAY 180 tablet 3  . metFORMIN (GLUCOPHAGE) 500 MG tablet TAKE 1 TABLET BY MOUTH EVERY DAY WITH BREAKFAST 90 tablet 3  . NON FORMULARY CBD oil    . pantoprazole (PROTONIX) 40 MG tablet TAKE 1 TABLET BY MOUTH EVERY DAY 90 tablet 1  . sertraline (ZOLOFT) 100 MG tablet TAKE 1 TABLET BY MOUTH EVERY DAY 90 tablet 1  . spironolactone (ALDACTONE) 25 MG tablet Take 1 tablet (25 mg total) by mouth daily. 30 tablet  11  . timolol (TIMOPTIC) 0.25 % ophthalmic solution     . torsemide (DEMADEX) 20 MG tablet TAKE 1/2 TABLET (10MG ) BY MOUTH DAILY AS NEEDED FOR EDEMA (SWELLING) 90 tablet 2   No facility-administered medications prior to visit.     ROS: Review of Systems  Constitutional: Negative for activity change, appetite change, chills, fatigue and unexpected weight change.  HENT: Negative for congestion, mouth sores and sinus pressure.   Eyes: Negative for visual disturbance.  Respiratory: Negative for cough and chest tightness.   Gastrointestinal: Negative for abdominal pain and nausea.  Genitourinary: Negative for difficulty urinating, frequency and vaginal pain.  Musculoskeletal: Positive for gait problem. Negative for back pain.  Skin: Negative for pallor and rash.  Neurological: Negative for dizziness, tremors, weakness, numbness and headaches.  Psychiatric/Behavioral: Negative for confusion, sleep disturbance and suicidal ideas.    Objective:  BP 134/76 (BP Location: Left Arm, Patient Position: Sitting, Cuff Size: Normal)   Pulse (!) 52   Temp 98.8 F (37.1 C) (Oral)   Ht 5\' 2"  (1.575 m)   Wt 144 lb (65.3 kg)   SpO2 96%   BMI 26.34 kg/m   BP Readings from Last 3 Encounters:  11/02/19 134/76  07/01/19 (!) 146/76  05/20/19 (!) 180/82    Wt Readings from Last 3 Encounters:  11/02/19 144 lb (65.3 kg)  07/01/19 142 lb (64.4 kg)  05/20/19 146 lb (66.2 kg)    Physical Exam Constitutional:      General: She is not in acute distress.    Appearance: She is well-developed.  HENT:     Head: Normocephalic.     Right Ear: External ear normal.     Left Ear: External ear normal.     Nose: Nose normal.  Eyes:     General:        Right eye: No discharge.        Left eye: No discharge.     Conjunctiva/sclera: Conjunctivae normal.     Pupils: Pupils are equal, round, and reactive to light.  Neck:     Musculoskeletal: Normal range of motion and neck supple.     Thyroid: No  thyromegaly.     Vascular: No JVD.     Trachea: No tracheal deviation.  Cardiovascular:     Rate and Rhythm: Normal rate and regular rhythm.     Heart sounds: Normal heart sounds.  Pulmonary:     Effort: No respiratory distress.     Breath sounds: No stridor. No wheezing.  Abdominal:     General: Bowel sounds are normal. There is no distension.     Palpations: Abdomen is soft. There is no mass.     Tenderness: There is no abdominal tenderness. There is no guarding or rebound.  Musculoskeletal:        General: No tenderness.  Lymphadenopathy:     Cervical: No cervical adenopathy.  Skin:    Findings: No erythema or rash.  Neurological:     Mental Status: She is oriented to person, place, and time.     Cranial Nerves: No cranial nerve deficit.     Motor: No abnormal muscle tone.     Coordination: Coordination abnormal.     Deep Tendon Reflexes: Reflexes normal.  Psychiatric:        Behavior: Behavior normal.        Thought Content: Thought content normal.        Judgment: Judgment normal.   cane  Lab Results  Component Value Date   WBC 5.0 08/13/2018   HGB 11.0 (L) 08/13/2018   HCT 34.3 (L) 08/13/2018   PLT 133.0 (L) 08/13/2018   GLUCOSE 102 (H) 07/01/2019   CHOL 154 10/19/2014   TRIG 56.0 10/19/2014   HDL 61.10 10/19/2014   LDLCALC 82 10/19/2014   ALT 9 08/13/2017   AST 15 08/13/2017   NA 140 07/01/2019   K 4.7 07/01/2019   CL 107 07/01/2019   CREATININE 1.43 (H) 07/01/2019   BUN 43 (H) 07/01/2019   CO2 25 07/01/2019   TSH 1.16 04/04/2018   HGBA1C 6.3 07/01/2019    Mm 3d Screen Breast Bilateral  Result Date: 07/23/2019 CLINICAL DATA:  Screening. EXAM: DIGITAL SCREENING BILATERAL MAMMOGRAM WITH TOMO AND CAD COMPARISON:  Previous exam(s). ACR Breast Density Category b: There are scattered areas of fibroglandular density. FINDINGS: There are no findings suspicious for malignancy. Images were processed with CAD. IMPRESSION: No mammographic evidence of malignancy. A  result letter of this screening mammogram will be mailed directly to the patient. RECOMMENDATION: Screening mammogram in one year. (Code:SM-B-01Y) BI-RADS CATEGORY  1: Negative. Electronically Signed   By: Ammie Ferrier M.D.   On: 07/23/2019 11:17    Assessment & Plan:  There are no diagnoses linked to this encounter.   No orders of the defined types were placed in this encounter.    Follow-up: No follow-ups on file.  Walker Kehr, MD

## 2019-11-02 NOTE — Assessment & Plan Note (Signed)
Coreg, Amlodipine NAS diet Spironolactone  BP Readings from Last 3 Encounters:  11/02/19 134/76  07/01/19 (!) 146/76  05/20/19 (!) 180/82

## 2019-11-06 ENCOUNTER — Other Ambulatory Visit: Payer: Medicare Other | Admitting: Orthotics

## 2019-11-11 ENCOUNTER — Other Ambulatory Visit (INDEPENDENT_AMBULATORY_CARE_PROVIDER_SITE_OTHER): Payer: Medicare Other

## 2019-11-11 ENCOUNTER — Other Ambulatory Visit: Payer: Self-pay | Admitting: Internal Medicine

## 2019-11-11 DIAGNOSIS — I10 Essential (primary) hypertension: Secondary | ICD-10-CM

## 2019-11-11 DIAGNOSIS — E1129 Type 2 diabetes mellitus with other diabetic kidney complication: Secondary | ICD-10-CM | POA: Diagnosis not present

## 2019-11-11 LAB — BASIC METABOLIC PANEL
BUN: 31 mg/dL — ABNORMAL HIGH (ref 6–23)
CO2: 23 mEq/L (ref 19–32)
Calcium: 9.7 mg/dL (ref 8.4–10.5)
Chloride: 109 mEq/L (ref 96–112)
Creatinine, Ser: 1.37 mg/dL — ABNORMAL HIGH (ref 0.40–1.20)
GFR: 44.21 mL/min — ABNORMAL LOW (ref >60.00)
Glucose, Bld: 108 mg/dL — ABNORMAL HIGH (ref 70–99)
Potassium: 5.3 mEq/L — ABNORMAL HIGH (ref 3.5–5.1)
Sodium: 140 mEq/L (ref 135–145)

## 2019-11-11 LAB — HEMOGLOBIN A1C: Hgb A1c MFr Bld: 6.3 % (ref 4.6–6.5)

## 2019-11-11 MED ORDER — LOSARTAN POTASSIUM 25 MG PO TABS: 25.0000 mg | ORAL_TABLET | Freq: Every day | ORAL | 3 refills | Status: DC

## 2019-12-10 ENCOUNTER — Other Ambulatory Visit: Payer: Self-pay | Admitting: Internal Medicine

## 2019-12-12 ENCOUNTER — Other Ambulatory Visit: Payer: Self-pay | Admitting: Internal Medicine

## 2019-12-14 ENCOUNTER — Other Ambulatory Visit: Payer: Self-pay | Admitting: Internal Medicine

## 2020-01-15 ENCOUNTER — Telehealth: Payer: Self-pay

## 2020-01-15 NOTE — Telephone Encounter (Signed)
New message   The patient found out last night her neighbor was tested positive COVID.   The patient had the first vaccine was shot on Tuesday.   The patient / family is asking for options.

## 2020-01-16 NOTE — Telephone Encounter (Signed)
Get tested for Covid 19 if symptoms-fever etc. Thanks

## 2020-01-18 ENCOUNTER — Ambulatory Visit: Payer: Medicare PPO | Attending: Internal Medicine

## 2020-01-18 ENCOUNTER — Telehealth: Payer: Self-pay | Admitting: Internal Medicine

## 2020-01-18 DIAGNOSIS — Z20822 Contact with and (suspected) exposure to covid-19: Secondary | ICD-10-CM

## 2020-01-18 NOTE — Telephone Encounter (Signed)
Pt informed of below. She had testing today.

## 2020-01-18 NOTE — Telephone Encounter (Signed)
Probably not.  She can use Afrin nasal spray for 5 days as directed.  Please schedule a telephone office visit if needed.  Thank you

## 2020-01-18 NOTE — Telephone Encounter (Signed)
    Patients son calling to report the patient has had a runny nose for about 2 weeks. Patient had covid test done today, nostrils had a slight amount blood on swab. The son wants to know if his mother should take any type of antibiotic    Please call

## 2020-01-19 LAB — NOVEL CORONAVIRUS, NAA: SARS-CoV-2, NAA: NOT DETECTED

## 2020-01-19 NOTE — Telephone Encounter (Signed)
Son notified 

## 2020-01-20 ENCOUNTER — Telehealth: Payer: Self-pay | Admitting: General Practice

## 2020-01-20 ENCOUNTER — Telehealth: Payer: Self-pay

## 2020-01-20 NOTE — Telephone Encounter (Signed)
Negative COVID results given. Patient results "NOT Detected." Caller expressed understanding. ° °

## 2020-01-20 NOTE — Telephone Encounter (Signed)
Patient's son, [name], called in to confirm patient's COVID19 lab results - DPR/POA verified - DOB/Address verified  - confirmed Negative results, no further questions.

## 2020-02-16 ENCOUNTER — Ambulatory Visit: Payer: Medicare Other | Admitting: Sports Medicine

## 2020-02-22 ENCOUNTER — Telehealth: Payer: Self-pay

## 2020-02-22 NOTE — Telephone Encounter (Signed)
Please advise  Pt has appt 03/01/20, no sooner appt available with you.

## 2020-02-22 NOTE — Telephone Encounter (Signed)
pls sch w/another provider ASAP. Keep appt w/me on 3/9 too. Thx

## 2020-02-22 NOTE — Telephone Encounter (Signed)
  New message    Son [name] calling C/o mom was dizzy twice last week and fell after the dizzy spell.    Offer appointment with another Physician  And NP Son refused to visit, stating Dr. Alain Marion knows his mother very well.     Son is looking for a recommendation

## 2020-02-24 NOTE — Telephone Encounter (Signed)
Pt refused to see anyone other than Dr Alain Marion but if feeling much better and wants to wait until the 03/01/20 visit.

## 2020-03-01 ENCOUNTER — Other Ambulatory Visit: Payer: Self-pay | Admitting: *Deleted

## 2020-03-01 ENCOUNTER — Ambulatory Visit (INDEPENDENT_AMBULATORY_CARE_PROVIDER_SITE_OTHER): Payer: Medicare PPO | Admitting: Sports Medicine

## 2020-03-01 ENCOUNTER — Other Ambulatory Visit: Payer: Self-pay

## 2020-03-01 ENCOUNTER — Encounter: Payer: Self-pay | Admitting: Internal Medicine

## 2020-03-01 ENCOUNTER — Ambulatory Visit: Payer: Medicare PPO | Admitting: Internal Medicine

## 2020-03-01 ENCOUNTER — Encounter: Payer: Self-pay | Admitting: Sports Medicine

## 2020-03-01 VITALS — Temp 97.5°F

## 2020-03-01 DIAGNOSIS — E1121 Type 2 diabetes mellitus with diabetic nephropathy: Secondary | ICD-10-CM | POA: Diagnosis not present

## 2020-03-01 DIAGNOSIS — R197 Diarrhea, unspecified: Secondary | ICD-10-CM | POA: Diagnosis not present

## 2020-03-01 DIAGNOSIS — M5440 Lumbago with sciatica, unspecified side: Secondary | ICD-10-CM | POA: Diagnosis not present

## 2020-03-01 DIAGNOSIS — M2142 Flat foot [pes planus] (acquired), left foot: Secondary | ICD-10-CM

## 2020-03-01 DIAGNOSIS — E119 Type 2 diabetes mellitus without complications: Secondary | ICD-10-CM | POA: Diagnosis not present

## 2020-03-01 DIAGNOSIS — M2141 Flat foot [pes planus] (acquired), right foot: Secondary | ICD-10-CM

## 2020-03-01 DIAGNOSIS — E1129 Type 2 diabetes mellitus with other diabetic kidney complication: Secondary | ICD-10-CM

## 2020-03-01 DIAGNOSIS — G8929 Other chronic pain: Secondary | ICD-10-CM

## 2020-03-01 DIAGNOSIS — I10 Essential (primary) hypertension: Secondary | ICD-10-CM | POA: Diagnosis not present

## 2020-03-01 DIAGNOSIS — M79674 Pain in right toe(s): Secondary | ICD-10-CM

## 2020-03-01 DIAGNOSIS — M2041 Other hammer toe(s) (acquired), right foot: Secondary | ICD-10-CM

## 2020-03-01 DIAGNOSIS — B351 Tinea unguium: Secondary | ICD-10-CM | POA: Diagnosis not present

## 2020-03-01 DIAGNOSIS — M79675 Pain in left toe(s): Secondary | ICD-10-CM

## 2020-03-01 DIAGNOSIS — M2042 Other hammer toe(s) (acquired), left foot: Secondary | ICD-10-CM

## 2020-03-01 DIAGNOSIS — E1142 Type 2 diabetes mellitus with diabetic polyneuropathy: Secondary | ICD-10-CM | POA: Diagnosis not present

## 2020-03-01 LAB — BASIC METABOLIC PANEL
BUN: 35 mg/dL — ABNORMAL HIGH (ref 6–23)
CO2: 25 mEq/L (ref 19–32)
Calcium: 9.7 mg/dL (ref 8.4–10.5)
Chloride: 107 mEq/L (ref 96–112)
Creatinine, Ser: 1.52 mg/dL — ABNORMAL HIGH (ref 0.40–1.20)
GFR: 39.19 mL/min — ABNORMAL LOW (ref >60.00)
Glucose, Bld: 92 mg/dL (ref 70–99)
Potassium: 5.5 mEq/L — ABNORMAL HIGH (ref 3.5–5.1)
Sodium: 138 mEq/L (ref 135–145)

## 2020-03-01 LAB — HEMOGLOBIN A1C: Hgb A1c MFr Bld: 6.2 % (ref 4.6–6.5)

## 2020-03-01 MED ORDER — GABAPENTIN 100 MG PO CAPS: 100.0000 mg | ORAL_CAPSULE | Freq: Three times a day (TID) | ORAL | 3 refills | Status: DC

## 2020-03-01 MED ORDER — SPIRONOLACTONE 25 MG PO TABS: 25.0000 mg | ORAL_TABLET | Freq: Every day | ORAL | 3 refills | Status: DC

## 2020-03-01 MED ORDER — SERTRALINE HCL 100 MG PO TABS: 100.0000 mg | ORAL_TABLET | Freq: Every day | ORAL | 3 refills | Status: DC

## 2020-03-01 MED ORDER — LOSARTAN POTASSIUM 25 MG PO TABS: 25.0000 mg | ORAL_TABLET | Freq: Every day | ORAL | 3 refills | Status: DC

## 2020-03-01 MED ORDER — AMLODIPINE BESYLATE 5 MG PO TABS: 5.0000 mg | ORAL_TABLET | Freq: Every day | ORAL | 3 refills | Status: DC

## 2020-03-01 MED ORDER — ALLOPURINOL 300 MG PO TABS: 300.0000 mg | ORAL_TABLET | Freq: Every day | ORAL | 3 refills | Status: DC

## 2020-03-01 MED ORDER — PANTOPRAZOLE SODIUM 40 MG PO TBEC: 40.0000 mg | DELAYED_RELEASE_TABLET | Freq: Every day | ORAL | 3 refills | Status: DC

## 2020-03-01 MED ORDER — CARVEDILOL 25 MG PO TABS: 25.0000 mg | ORAL_TABLET | Freq: Two times a day (BID) | ORAL | 3 refills | Status: DC

## 2020-03-01 MED ORDER — METFORMIN HCL 500 MG PO TABS: 500.0000 mg | ORAL_TABLET | Freq: Every day | ORAL | 3 refills | Status: DC

## 2020-03-01 NOTE — Assessment & Plan Note (Signed)
Coreg, Amlodipine, Losartan  Spironolactone

## 2020-03-01 NOTE — Assessment & Plan Note (Signed)
Metformin Labs 

## 2020-03-01 NOTE — Assessment & Plan Note (Signed)
Gabapentin

## 2020-03-01 NOTE — Assessment & Plan Note (Deleted)
Ongoing, chronic.  

## 2020-03-01 NOTE — Progress Notes (Signed)
Subjective: Breanna [name] is a 86 y.o. female patient with history of diabetes who returns to office today complaining of long,mildly painful nails and callus to toes while ambulating in shoes; unable to trim.  Patient is assisted by son this visit and reports that her last blood sugar was not recorded. Last A1c 6.4. Reports that she needs new shoes. No other pedal complaints.  Patient Active Problem List   Diagnosis Date Noted  . Fall against object 12/25/2018  . Head contusion 12/25/2018  . Acute pain of right knee 09/28/2018  . Well adult exam 05/13/2018  . Herpes labialis 05/13/2018  . Night sweats 04/04/2018  . Breast symptom 10/30/2017  . Diarrhea 08/21/2017  . UTI (urinary tract infection) 08/21/2017  . Lung nodule 08/21/2017  . FTT (failure to thrive) in adult 08/21/2017  . Shortness of breath 08/13/2017  . Cough 08/13/2017  . Fatigue 08/13/2017  . Fever 08/13/2017  . Blurry vision 08/13/2017  . Shoulder pain, right 07/25/2017  . Callus of foot 06/07/2017  . Need for prophylactic vaccination and inoculation against influenza 11/21/2016  . Trigger finger, acquired 11/12/2016  . Meteorism 07/11/2016  . Dark stools 01/10/2016  . Osteoporosis 07/13/2015  . Fall at home 01/13/2015  . Cornea disorder 11/22/2014  . Primary open angle glaucoma of both eyes, indeterminate stage 11/22/2014  . Secondary corneal edema 10/14/2014  . Swelling of left knee joint 04/07/2014  . Grief 01/04/2014  . Syncope 01/04/2014  . Thoracic spine pain 10/22/2013  . Hypernatremia 07/07/2013  . Viral intestinal infection 03/25/2013  . Dehydration, moderate 03/24/2013  . Food poisoning 03/24/2013  . Headache(784.0) 12/22/2012  . URI (upper respiratory infection) 10/21/2012  . Diabetic neuropathy ([place]) 07/30/2012  . Neck pain on right side 07/30/2012  . Cerumen impaction 07/17/2012  . Cystoid macular edema 06/19/2012  . History of corneal transplant 06/19/2012  . Weight loss 04/10/2012  .  Wrist pain, right 04/06/2011  . PARESTHESIA 01/03/2011  . FULL INCONTINENCE OF FECES 01/01/2011  . ABDOMINAL PAIN RIGHT UPPER QUADRANT 01/01/2011  . LEG PAIN, LEFT 10/02/2010  . Cerebral artery occlusion with cerebral infarction ([place]) 03/27/2010  . GERD 03/27/2010  . DIVERTICULOSIS-COLON 03/27/2010  . CHOLELITHIASIS 03/27/2010  . LOW BACK PAIN 09/07/2009  . ANEMIA, IRON DEFICIENCY, HX OF 06/14/2009  . DYSPHAGIA 05/16/2009  . Dysphagia 05/16/2009  . Dyslipidemia 11/06/2007  . Type 2 diabetes mellitus with renal manifestations, controlled ([place]) 07/29/2007  . Gout 07/29/2007  . Essential hypertension 07/29/2007  . HYDRONEPHROSIS 07/29/2007  . Osteoarthritis 07/29/2007  . COLONIC POLYPS, HX OF 07/29/2007  . DIVERTICULITIS, HX OF 07/29/2007   Current Outpatient Medications on File Prior to Visit  Medication Sig Dispense Refill  . Acetaminophen (TYLENOL ARTHRITIS PAIN PO) Take by mouth.    Marland Kitchen allopurinol (ZYLOPRIM) 300 MG tablet Take 1 tablet (300 mg total) by mouth daily. 90 tablet 3  . amLODipine (NORVASC) 5 MG tablet Take 1 tablet (5 mg total) by mouth daily. 90 tablet 3  . aspirin 81 MG EC tablet Take 81 mg by mouth daily.      Marland Kitchen atropine 1 % ophthalmic solution Place 1 drop into the left eye daily.    . brimonidine (ALPHAGAN) 0.15 % ophthalmic solution 1 drop.    Marland Kitchen CALCIUM PO Take by mouth daily.    . carvedilol (COREG) 25 MG tablet Take 1 tablet (25 mg total) by mouth 2 (two) times daily with a meal. 180 tablet 3  . Cholecalciferol (VITAMIN D3) 2000 UNITS  capsule Take 2,000 Units by mouth daily.      . cyanocobalamin 2000 MCG tablet Take 2,000 mcg by mouth daily.    . diclofenac sodium (VOLTAREN) 1 % GEL Apply 2 g topically 2 (two) times daily. 200 g 3  . diphenhydramine-acetaminophen (TYLENOL PM) 25-500 MG TABS 1 tablet at bedtime as needed.    . dorzolamide-timolol (COSOPT) 22.3-6.8 MG/ML ophthalmic solution 1 drop.    Marland Kitchen gabapentin (NEURONTIN) 100 MG capsule Take 1 capsule (100  mg total) by mouth 3 (three) times daily. 270 capsule 3  . latanoprost (XALATAN) 0.005 % ophthalmic solution Place 1 drop into both eyes 2 (two) times daily.    Marland Kitchen losartan (COZAAR) 25 MG tablet Take 1 tablet (25 mg total) by mouth daily. 90 tablet 3  . metFORMIN (GLUCOPHAGE) 500 MG tablet Take 1 tablet (500 mg total) by mouth daily with breakfast. 90 tablet 3  . NON FORMULARY CBD oil    . pantoprazole (PROTONIX) 40 MG tablet Take 1 tablet (40 mg total) by mouth daily. 90 tablet 3  . sertraline (ZOLOFT) 100 MG tablet Take 1 tablet (100 mg total) by mouth daily. 90 tablet 3  . spironolactone (ALDACTONE) 25 MG tablet Take 1 tablet (25 mg total) by mouth daily. 90 tablet 3  . timolol (TIMOPTIC) 0.25 % ophthalmic solution     . torsemide (DEMADEX) 20 MG tablet TAKE 1/2 TABLET (10MG ) BY MOUTH DAILY AS NEEDED FOR EDEMA (SWELLING) 90 tablet 2   No current facility-administered medications on file prior to visit.   Allergies  Allergen Reactions  . Aspirin Itching    But tolerates low dose  . Atenolol     REACTION: fatigue  . Codeine Itching  . Codeine Sulfate   . Hydrochlorothiazide     REACTION: gout  . Hydrocodone       side effects - hallucinations  . Ibuprofen     Upset stomach w/high doses  . Lisinopril     REACTION: cough  . Onion Other (See Comments)    Dry mouth/ gets sores  . Valsartan Itching  . Verapamil     REACTION: SOB  . Adhesive  [Tape] Rash    Recent Results (from the past 2160 hour(s))  Novel Coronavirus, NAA ([facility])     Status: None   Collection Time: 01/18/20  9:30 AM   Specimen: Nasopharyngeal(NP) swabs in vial transport medium   NASOPHARYNGE  TESTING  Result Value Ref Range   SARS-CoV-2, NAA Not Detected Not Detected    Comment: This nucleic acid amplification test was developed and its performance characteristics determined by [employer]. Nucleic acid amplification tests include RT-PCR and TMA. This test has not been FDA cleared or approved.  This test has been authorized by FDA under an Emergency Use Authorization (EUA). This test is only authorized for the duration of time the declaration that circumstances exist justifying the authorization of the emergency use of in vitro diagnostic tests for detection of SARS-CoV-2 virus and/or diagnosis of COVID-19 infection under section 564(b)(1) of the Act, 21 U.S.C. [id number]) (1), unless the authorization is terminated or revoked sooner. When diagnostic testing is negative, the possibility of a false negative result should be considered in the context of a patient's recent exposures and the presence of clinical signs and symptoms consistent with COVID-19. An individual without symptoms of COVID-19 and who is not shedding SARS-CoV-2 virus wo uld expect to have a negative (not detected) result in this assay.     Objective: General:  Patient is awake, alert, and oriented x 3 and in no acute distress.  Integument: Skin is warm, dry and supple bilateral. Nails are tender, long, thickened and dystrophic with subungual debris, consistent with onychomycosis, 1-5 on left and 1,3-5 on right. No signs of infection. Minimal callus at distal tuft at 3rd toes bilateral. Remaining integument unremarkable.  Vasculature:  Dorsalis Pedis pulse 1/4 bilateral. Posterior Tibial pulse  1/4 bilateral. Capillary fill time <5 sec 1-5 bilateral. Scant hair growth to the level of the digits. Temperature gradient within normal limits. + varicosities present bilateral. No edema present bilateral.   Neurology: The patient has diminished sensation measured with a 5.07/10g Semmes Weinstein Monofilament at all pedal sites bilateral . Vibratory sensation diminished bilateral with tuning fork. No Babinski sign present bilateral.   Musculoskeletal: Asymptomatic hammertoe and pes planus pedal deformities noted bilateral. Muscular strength 5/5 in all lower extremity muscular groups bilateral without pain on range of  motion. No tenderness with calf compression bilateral.  Assessment and Plan: Problem List Items Addressed This Visit    None    Visit Diagnoses    Pain due to onychomycosis of toenails of both feet    -  Primary   Diabetes mellitus without complication (HCC)       Hammer toes of both feet       Pes planus of both feet       Diabetes mellitus with no complication (HCC)          -Examined patient. -Re-discussed and educated patient on diabetic foot care, especially with  regards to the vascular, neurological and musculoskeletal systems.  -Mechanically debrided all nails 1-5 bilateral using sterile nail nipper and filed with dremel without incident  -Mechanically debrided callus at tips of third toes bilateral using a sterile 15 blade without incident at no additional charge -Continue with toe cushions to use as instructed and recommend new diabetic shoes;[employer] diabetic shoe order form was completed; office to contact primary care for approval / certification;  Office to arrange shoe fitting and dispensing.  -Patient to return  in 3 months for at risk foot care and for diabetic shoe measurements as scheduled -Patient advised to call the office if any problems or questions arise in the meantime.  Landis Martins, DPM

## 2020-03-01 NOTE — Progress Notes (Signed)
Subjective:  Patient ID: Breanna [name], female    DOB: 15-Dec-1931  Age: 86 y.o. MRN: [id number]  CC: No chief complaint on file.   HPI [facility] presents for HTN, GERD, DM f/u BP ok at home  Outpatient Medications Prior to Visit  Medication Sig Dispense Refill  . Acetaminophen (TYLENOL ARTHRITIS PAIN PO) Take by mouth.    Marland Kitchen allopurinol (ZYLOPRIM) 300 MG tablet TAKE 1 TABLET BY MOUTH EVERY DAY 90 tablet 3  . amLODipine (NORVASC) 5 MG tablet TAKE 1 TABLET BY MOUTH EVERY DAY 90 tablet 1  . aspirin 81 MG EC tablet Take 81 mg by mouth daily.      Marland Kitchen atropine 1 % ophthalmic solution Place 1 drop into the left eye daily.    . brimonidine (ALPHAGAN) 0.15 % ophthalmic solution 1 drop.    Marland Kitchen CALCIUM PO Take by mouth daily.    . carvedilol (COREG) 25 MG tablet TAKE 1 TABLET BY MOUTH TWICE A DAY WITH MEALS 180 tablet 1  . Cholecalciferol (VITAMIN D3) 2000 UNITS capsule Take 2,000 Units by mouth daily.      . cyanocobalamin 2000 MCG tablet Take 2,000 mcg by mouth daily.    . diclofenac sodium (VOLTAREN) 1 % GEL Apply 2 g topically 2 (two) times daily. 200 g 3  . diphenhydramine-acetaminophen (TYLENOL PM) 25-500 MG TABS 1 tablet at bedtime as needed.    . dorzolamide-timolol (COSOPT) 22.3-6.8 MG/ML ophthalmic solution 1 drop.    Marland Kitchen gabapentin (NEURONTIN) 100 MG capsule TAKE 1 CAPSULE BY MOUTH THREE TIMES A DAY 270 capsule 1  . latanoprost (XALATAN) 0.005 % ophthalmic solution Place 1 drop into both eyes 2 (two) times daily.    Marland Kitchen losartan (COZAAR) 25 MG tablet Take 1 tablet (25 mg total) by mouth daily. 90 tablet 3  . metFORMIN (GLUCOPHAGE) 500 MG tablet TAKE 1 TABLET BY MOUTH EVERY DAY WITH BREAKFAST 90 tablet 3  . NON FORMULARY CBD oil    . pantoprazole (PROTONIX) 40 MG tablet TAKE 1 TABLET BY MOUTH EVERY DAY 90 tablet 1  . sertraline (ZOLOFT) 100 MG tablet TAKE 1 TABLET BY MOUTH EVERY DAY 90 tablet 1  . spironolactone (ALDACTONE) 25 MG tablet Take 1 tablet (25 mg total) by mouth daily. 30  tablet 11  . timolol (TIMOPTIC) 0.25 % ophthalmic solution     . torsemide (DEMADEX) 20 MG tablet TAKE 1/2 TABLET (10MG ) BY MOUTH DAILY AS NEEDED FOR EDEMA (SWELLING) 90 tablet 2   No facility-administered medications prior to visit.    ROS: Review of Systems  Constitutional: Negative for activity change, appetite change, chills, fatigue and unexpected weight change.  HENT: Negative for congestion, mouth sores and sinus pressure.   Eyes: Negative for visual disturbance.  Respiratory: Negative for cough and chest tightness.   Gastrointestinal: Negative for abdominal pain and nausea.  Genitourinary: Negative for difficulty urinating, frequency and vaginal pain.  Musculoskeletal: Positive for back pain and gait problem.  Skin: Negative for pallor and rash.  Neurological: Negative for dizziness, tremors, weakness, numbness and headaches.  Psychiatric/Behavioral: Negative for confusion, sleep disturbance and suicidal ideas.    Objective:  BP (!) 150/62 (BP Location: Left Arm, Patient Position: Sitting, Cuff Size: Normal)   Pulse (!) 44   Temp 98 F (36.7 C) (Oral)   Ht 5\' 2"  (1.575 m)   Wt 144 lb (65.3 kg)   SpO2 94%   BMI 26.34 kg/m   BP Readings from Last 3 Encounters:  03/01/20 Marland Kitchen)  150/62  11/02/19 134/76  07/01/19 (!) 146/76    Wt Readings from Last 3 Encounters:  03/01/20 144 lb (65.3 kg)  11/02/19 144 lb (65.3 kg)  07/01/19 142 lb (64.4 kg)    Physical Exam Constitutional:      General: She is not in acute distress.    Appearance: She is well-developed.  HENT:     Head: Normocephalic.     Right Ear: External ear normal.     Left Ear: External ear normal.     Nose: Nose normal.  Eyes:     General:        Right eye: No discharge.        Left eye: No discharge.     Conjunctiva/sclera: Conjunctivae normal.     Pupils: Pupils are equal, round, and reactive to light.  Neck:     Thyroid: No thyromegaly.     Vascular: No JVD.     Trachea: No tracheal deviation.   Cardiovascular:     Rate and Rhythm: Normal rate and regular rhythm.     Heart sounds: Normal heart sounds.  Pulmonary:     Effort: No respiratory distress.     Breath sounds: No stridor. No wheezing.  Abdominal:     General: Bowel sounds are normal. There is no distension.     Palpations: Abdomen is soft. There is no mass.     Tenderness: There is no abdominal tenderness. There is no guarding or rebound.  Musculoskeletal:        General: No tenderness.     Cervical back: Normal range of motion and neck supple.  Lymphadenopathy:     Cervical: No cervical adenopathy.  Skin:    Findings: No erythema or rash.  Neurological:     Cranial Nerves: No cranial nerve deficit.     Motor: No abnormal muscle tone.     Coordination: Coordination normal.     Gait: Gait abnormal.     Deep Tendon Reflexes: Reflexes normal.  Psychiatric:        Behavior: Behavior normal.        Thought Content: Thought content normal.        Judgment: Judgment normal.     Lab Results  Component Value Date   WBC 5.0 08/13/2018   HGB 11.0 (L) 08/13/2018   HCT 34.3 (L) 08/13/2018   PLT 133.0 (L) 08/13/2018   GLUCOSE 108 (H) 11/11/2019   CHOL 154 10/19/2014   TRIG 56.0 10/19/2014   HDL 61.10 10/19/2014   LDLCALC 82 10/19/2014   ALT 9 08/13/2017   AST 15 08/13/2017   NA 140 11/11/2019   K 5.3 No hemolysis seen (H) 11/11/2019   CL 109 11/11/2019   CREATININE 1.37 (H) 11/11/2019   BUN 31 (H) 11/11/2019   CO2 23 11/11/2019   TSH 1.16 04/04/2018   HGBA1C 6.3 11/11/2019    MM 3D SCREEN BREAST BILATERAL  Result Date: 07/23/2019 CLINICAL DATA:  Screening. EXAM: DIGITAL SCREENING BILATERAL MAMMOGRAM WITH TOMO AND CAD COMPARISON:  Previous exam(s). ACR Breast Density Category b: There are scattered areas of fibroglandular density. FINDINGS: There are no findings suspicious for malignancy. Images were processed with CAD. IMPRESSION: No mammographic evidence of malignancy. A result letter of this screening  mammogram will be mailed directly to the patient. RECOMMENDATION: Screening mammogram in one year. (Code:SM-B-01Y) BI-RADS CATEGORY  1: Negative. Electronically Signed   By: Ammie Ferrier M.D.   On: 07/23/2019 11:17    Assessment & Plan:  Follow-up: No follow-ups on file.  Walker Kehr, MD

## 2020-03-02 ENCOUNTER — Other Ambulatory Visit: Payer: Self-pay | Admitting: Internal Medicine

## 2020-03-02 DIAGNOSIS — Z9841 Cataract extraction status, right eye: Secondary | ICD-10-CM | POA: Diagnosis not present

## 2020-03-02 DIAGNOSIS — Z947 Corneal transplant status: Secondary | ICD-10-CM | POA: Diagnosis not present

## 2020-03-02 DIAGNOSIS — H401134 Primary open-angle glaucoma, bilateral, indeterminate stage: Secondary | ICD-10-CM | POA: Diagnosis not present

## 2020-03-02 DIAGNOSIS — Z961 Presence of intraocular lens: Secondary | ICD-10-CM | POA: Diagnosis not present

## 2020-03-02 MED ORDER — ALLOPURINOL 300 MG PO TABS: 150.0000 mg | ORAL_TABLET | Freq: Every day | ORAL | 3 refills | Status: DC

## 2020-03-02 MED ORDER — SPIRONOLACTONE 25 MG PO TABS: 12.5000 mg | ORAL_TABLET | Freq: Every day | ORAL | 3 refills | Status: DC

## 2020-03-15 ENCOUNTER — Other Ambulatory Visit: Payer: Self-pay

## 2020-03-15 ENCOUNTER — Ambulatory Visit: Payer: Medicare PPO | Admitting: Orthotics

## 2020-03-15 DIAGNOSIS — M2042 Other hammer toe(s) (acquired), left foot: Secondary | ICD-10-CM

## 2020-03-15 DIAGNOSIS — E119 Type 2 diabetes mellitus without complications: Secondary | ICD-10-CM

## 2020-03-15 DIAGNOSIS — B351 Tinea unguium: Secondary | ICD-10-CM

## 2020-03-15 DIAGNOSIS — M79675 Pain in left toe(s): Secondary | ICD-10-CM

## 2020-03-15 DIAGNOSIS — M2142 Flat foot [pes planus] (acquired), left foot: Secondary | ICD-10-CM

## 2020-03-15 DIAGNOSIS — M79674 Pain in right toe(s): Secondary | ICD-10-CM

## 2020-03-15 DIAGNOSIS — M2041 Other hammer toe(s) (acquired), right foot: Secondary | ICD-10-CM

## 2020-03-15 DIAGNOSIS — M2141 Flat foot [pes planus] (acquired), right foot: Secondary | ICD-10-CM

## 2020-03-15 NOTE — Progress Notes (Signed)

## 2020-03-18 ENCOUNTER — Other Ambulatory Visit: Payer: Medicare PPO | Admitting: Orthotics

## 2020-03-21 ENCOUNTER — Other Ambulatory Visit: Payer: Medicare PPO | Admitting: Orthotics

## 2020-03-28 DIAGNOSIS — Z947 Corneal transplant status: Secondary | ICD-10-CM | POA: Diagnosis not present

## 2020-03-28 DIAGNOSIS — H401133 Primary open-angle glaucoma, bilateral, severe stage: Secondary | ICD-10-CM | POA: Diagnosis not present

## 2020-04-23 DIAGNOSIS — E1142 Type 2 diabetes mellitus with diabetic polyneuropathy: Secondary | ICD-10-CM | POA: Diagnosis not present

## 2020-04-23 DIAGNOSIS — M109 Gout, unspecified: Secondary | ICD-10-CM | POA: Diagnosis not present

## 2020-04-23 DIAGNOSIS — Z833 Family history of diabetes mellitus: Secondary | ICD-10-CM | POA: Diagnosis not present

## 2020-04-23 DIAGNOSIS — Z886 Allergy status to analgesic agent status: Secondary | ICD-10-CM | POA: Diagnosis not present

## 2020-04-23 DIAGNOSIS — F339 Major depressive disorder, recurrent, unspecified: Secondary | ICD-10-CM | POA: Diagnosis not present

## 2020-04-23 DIAGNOSIS — Z885 Allergy status to narcotic agent status: Secondary | ICD-10-CM | POA: Diagnosis not present

## 2020-04-23 DIAGNOSIS — I10 Essential (primary) hypertension: Secondary | ICD-10-CM | POA: Diagnosis not present

## 2020-04-23 DIAGNOSIS — Z7982 Long term (current) use of aspirin: Secondary | ICD-10-CM | POA: Diagnosis not present

## 2020-04-23 DIAGNOSIS — M1990 Unspecified osteoarthritis, unspecified site: Secondary | ICD-10-CM | POA: Diagnosis not present

## 2020-04-23 DIAGNOSIS — Z7984 Long term (current) use of oral hypoglycemic drugs: Secondary | ICD-10-CM | POA: Diagnosis not present

## 2020-04-23 DIAGNOSIS — K219 Gastro-esophageal reflux disease without esophagitis: Secondary | ICD-10-CM | POA: Diagnosis not present

## 2020-04-23 DIAGNOSIS — Z8673 Personal history of transient ischemic attack (TIA), and cerebral infarction without residual deficits: Secondary | ICD-10-CM | POA: Diagnosis not present

## 2020-04-23 DIAGNOSIS — H409 Unspecified glaucoma: Secondary | ICD-10-CM | POA: Diagnosis not present

## 2020-04-27 DIAGNOSIS — H548 Legal blindness, as defined in USA: Secondary | ICD-10-CM | POA: Diagnosis not present

## 2020-04-27 DIAGNOSIS — H524 Presbyopia: Secondary | ICD-10-CM | POA: Diagnosis not present

## 2020-04-27 DIAGNOSIS — H52201 Unspecified astigmatism, right eye: Secondary | ICD-10-CM | POA: Diagnosis not present

## 2020-04-27 DIAGNOSIS — H5201 Hypermetropia, right eye: Secondary | ICD-10-CM | POA: Diagnosis not present

## 2020-06-07 ENCOUNTER — Other Ambulatory Visit: Payer: Self-pay

## 2020-06-07 ENCOUNTER — Ambulatory Visit: Payer: Medicare PPO | Admitting: Sports Medicine

## 2020-06-07 ENCOUNTER — Ambulatory Visit (INDEPENDENT_AMBULATORY_CARE_PROVIDER_SITE_OTHER): Payer: Medicare PPO | Admitting: Orthotics

## 2020-06-07 ENCOUNTER — Encounter: Payer: Self-pay | Admitting: Sports Medicine

## 2020-06-07 VITALS — Temp 96.8°F

## 2020-06-07 DIAGNOSIS — M2141 Flat foot [pes planus] (acquired), right foot: Secondary | ICD-10-CM

## 2020-06-07 DIAGNOSIS — M2041 Other hammer toe(s) (acquired), right foot: Secondary | ICD-10-CM

## 2020-06-07 DIAGNOSIS — M2142 Flat foot [pes planus] (acquired), left foot: Secondary | ICD-10-CM

## 2020-06-07 DIAGNOSIS — M79674 Pain in right toe(s): Secondary | ICD-10-CM

## 2020-06-07 DIAGNOSIS — M79675 Pain in left toe(s): Secondary | ICD-10-CM

## 2020-06-07 DIAGNOSIS — M2042 Other hammer toe(s) (acquired), left foot: Secondary | ICD-10-CM

## 2020-06-07 DIAGNOSIS — E119 Type 2 diabetes mellitus without complications: Secondary | ICD-10-CM

## 2020-06-07 DIAGNOSIS — B351 Tinea unguium: Secondary | ICD-10-CM

## 2020-06-07 NOTE — Progress Notes (Signed)
Subjective: Breanna [name] is a 86 y.o. female patient with history of diabetes who returns to office today complaining of long,mildly painful nails and callus to toes while ambulating in shoes; unable to trim.  Patient is assisted by friend this visit and reports that her last blood sugar was not recorded. Last A1c unsure. Reports that she is also here to pick up her diabetic shoes. No other pedal complaints.  Patient Active Problem List   Diagnosis Date Noted  . Fall against object 12/25/2018  . Head contusion 12/25/2018  . Acute pain of right knee 09/28/2018  . Well adult exam 05/13/2018  . Herpes labialis 05/13/2018  . Night sweats 04/04/2018  . Breast symptom 10/30/2017  . Diarrhea 08/21/2017  . UTI (urinary tract infection) 08/21/2017  . Lung nodule 08/21/2017  . FTT (failure to thrive) in adult 08/21/2017  . Shortness of breath 08/13/2017  . Cough 08/13/2017  . Fatigue 08/13/2017  . Fever 08/13/2017  . Blurry vision 08/13/2017  . Shoulder pain, right 07/25/2017  . Callus of foot 06/07/2017  . Need for prophylactic vaccination and inoculation against influenza 11/21/2016  . Trigger finger, acquired 11/12/2016  . Meteorism 07/11/2016  . Dark stools 01/10/2016  . Osteoporosis 07/13/2015  . Fall at home 01/13/2015  . Cornea disorder 11/22/2014  . Primary open angle glaucoma of both eyes, indeterminate stage 11/22/2014  . Secondary corneal edema 10/14/2014  . Swelling of left knee joint 04/07/2014  . Grief 01/04/2014  . Syncope 01/04/2014  . Thoracic spine pain 10/22/2013  . Hypernatremia 07/07/2013  . Viral intestinal infection 03/25/2013  . Dehydration, moderate 03/24/2013  . Food poisoning 03/24/2013  . Headache(784.0) 12/22/2012  . URI (upper respiratory infection) 10/21/2012  . Diabetic neuropathy ([place]) 07/30/2012  . Neck pain on right side 07/30/2012  . Cerumen impaction 07/17/2012  . Cystoid macular edema 06/19/2012  . History of corneal transplant 06/19/2012   . Weight loss 04/10/2012  . Wrist pain, right 04/06/2011  . PARESTHESIA 01/03/2011  . FULL INCONTINENCE OF FECES 01/01/2011  . ABDOMINAL PAIN RIGHT UPPER QUADRANT 01/01/2011  . LEG PAIN, LEFT 10/02/2010  . Cerebral artery occlusion with cerebral infarction ([place]) 03/27/2010  . GERD 03/27/2010  . DIVERTICULOSIS-COLON 03/27/2010  . CHOLELITHIASIS 03/27/2010  . LOW BACK PAIN 09/07/2009  . ANEMIA, IRON DEFICIENCY, HX OF 06/14/2009  . DYSPHAGIA 05/16/2009  . Dysphagia 05/16/2009  . Dyslipidemia 11/06/2007  . Type 2 diabetes mellitus with renal manifestations, controlled ([place]) 07/29/2007  . Gout 07/29/2007  . Essential hypertension 07/29/2007  . HYDRONEPHROSIS 07/29/2007  . Osteoarthritis 07/29/2007  . COLONIC POLYPS, HX OF 07/29/2007  . DIVERTICULITIS, HX OF 07/29/2007   Current Outpatient Medications on File Prior to Visit  Medication Sig Dispense Refill  . Acetaminophen (TYLENOL ARTHRITIS PAIN PO) Take by mouth.    Marland Kitchen allopurinol (ZYLOPRIM) 300 MG tablet Take 0.5 tablets (150 mg total) by mouth daily. 90 tablet 3  . amLODipine (NORVASC) 5 MG tablet Take 1 tablet (5 mg total) by mouth daily. 90 tablet 3  . aspirin 81 MG EC tablet Take 81 mg by mouth daily.      Marland Kitchen atropine 1 % ophthalmic solution Place 1 drop into the left eye daily.    . brimonidine (ALPHAGAN) 0.15 % ophthalmic solution 1 drop.    Marland Kitchen CALCIUM PO Take by mouth daily.    . carvedilol (COREG) 25 MG tablet Take 1 tablet (25 mg total) by mouth 2 (two) times daily with a meal. 180 tablet 3  .  Cholecalciferol (VITAMIN D3) 2000 UNITS capsule Take 2,000 Units by mouth daily.      . cyanocobalamin 2000 MCG tablet Take 2,000 mcg by mouth daily.    . diclofenac sodium (VOLTAREN) 1 % GEL Apply 2 g topically 2 (two) times daily. 200 g 3  . diphenhydramine-acetaminophen (TYLENOL PM) 25-500 MG TABS 1 tablet at bedtime as needed.    . dorzolamide-timolol (COSOPT) 22.3-6.8 MG/ML ophthalmic solution 1 drop.    Marland Kitchen gabapentin (NEURONTIN)  100 MG capsule Take 1 capsule (100 mg total) by mouth 3 (three) times daily. 270 capsule 3  . latanoprost (XALATAN) 0.005 % ophthalmic solution Place 1 drop into both eyes 2 (two) times daily.    . metFORMIN (GLUCOPHAGE) 500 MG tablet Take 1 tablet (500 mg total) by mouth daily with breakfast. 90 tablet 3  . NON FORMULARY CBD oil    . pantoprazole (PROTONIX) 40 MG tablet Take 1 tablet (40 mg total) by mouth daily. 90 tablet 3  . sertraline (ZOLOFT) 100 MG tablet Take 1 tablet (100 mg total) by mouth daily. 90 tablet 3  . spironolactone (ALDACTONE) 25 MG tablet Take 0.5 tablets (12.5 mg total) by mouth daily. 90 tablet 3  . timolol (TIMOPTIC) 0.25 % ophthalmic solution     . torsemide (DEMADEX) 20 MG tablet TAKE 1/2 TABLET (10MG ) BY MOUTH DAILY AS NEEDED FOR EDEMA (SWELLING) 90 tablet 2   No current facility-administered medications on file prior to visit.   Allergies  Allergen Reactions  . Aspirin Itching    But tolerates low dose  . Atenolol     REACTION: fatigue  . Codeine Itching  . Codeine Sulfate   . Hydrochlorothiazide     REACTION: gout  . Hydrocodone       side effects - hallucinations  . Ibuprofen     Upset stomach w/high doses  . Lisinopril     REACTION: cough  . Onion Other (See Comments)    Dry mouth/ gets sores  . Valsartan Itching  . Verapamil     REACTION: SOB  . Adhesive  [Tape] Rash    No results found for this or any previous visit (from the past 2160 hour(s)).  Objective: General: Patient is awake, alert, and oriented x 3 and in no acute distress.  Integument: Skin is warm, dry and supple bilateral. Nails are tender, long, thickened and dystrophic with subungual debris, consistent with onychomycosis, 1-5 on left and 1,3-5 on right. No signs of infection. Minimal callus at distal tuft at 3rd toes bilateral. Remaining integument unremarkable.  Neurovascular status unchanged since last visit.  Musculoskeletal: Asymptomatic hammertoe and pes planus pedal  deformities noted bilateral. Muscular strength 5/5 in all lower extremity muscular groups bilateral without pain on range of motion. No tenderness with calf compression bilateral.  Assessment and Plan: Problem List Items Addressed This Visit    None    Visit Diagnoses    Pain due to onychomycosis of toenails of both feet    -  Primary   Diabetes mellitus without complication (HCC)       Hammer toes of both feet       Pes planus of both feet          -Examined patient. -Re-discussed and educated patient on diabetic foot care, especially with  regards to the vascular, neurological and musculoskeletal systems.  -Mechanically debrided all nails 1-5 bilateral using sterile nail nipper and filed with dremel without incident  -Mechanically debrided callus at tips of third toes  bilateral using a sterile 15 blade without incident at no additional charge like previous -Diabetic shoes were dispensed by [facility]  -Patient to return  in 3 months for at risk foot care and for diabetic shoe measurements as scheduled -Patient advised to call the office if any problems or questions arise in the meantime.  Landis Martins, DPM

## 2020-06-07 NOTE — Progress Notes (Signed)

## 2020-06-24 ENCOUNTER — Other Ambulatory Visit: Payer: Self-pay | Admitting: Internal Medicine

## 2020-06-24 DIAGNOSIS — Z1231 Encounter for screening mammogram for malignant neoplasm of breast: Secondary | ICD-10-CM

## 2020-07-06 ENCOUNTER — Ambulatory Visit: Payer: Medicare PPO | Admitting: Internal Medicine

## 2020-07-06 ENCOUNTER — Other Ambulatory Visit: Payer: Self-pay

## 2020-07-06 ENCOUNTER — Encounter: Payer: Self-pay | Admitting: Internal Medicine

## 2020-07-06 VITALS — BP 160/80 | HR 48 | Temp 97.9°F | Ht 62.0 in | Wt 143.0 lb

## 2020-07-06 DIAGNOSIS — I10 Essential (primary) hypertension: Secondary | ICD-10-CM | POA: Diagnosis not present

## 2020-07-06 DIAGNOSIS — M25559 Pain in unspecified hip: Secondary | ICD-10-CM | POA: Diagnosis not present

## 2020-07-06 DIAGNOSIS — N183 Chronic kidney disease, stage 3 unspecified: Secondary | ICD-10-CM

## 2020-07-06 DIAGNOSIS — E1142 Type 2 diabetes mellitus with diabetic polyneuropathy: Secondary | ICD-10-CM | POA: Diagnosis not present

## 2020-07-06 DIAGNOSIS — E785 Hyperlipidemia, unspecified: Secondary | ICD-10-CM

## 2020-07-06 DIAGNOSIS — E1122 Type 2 diabetes mellitus with diabetic chronic kidney disease: Secondary | ICD-10-CM

## 2020-07-06 NOTE — Addendum Note (Signed)
Addended by: Cresenciano Lick on: 07/06/2020 09:18 AM   Modules accepted: Orders

## 2020-07-06 NOTE — Patient Instructions (Addendum)
Hip Bursitis  Hip bursitis is swelling of a fluid-filled sac (bursa) in your hip joint. This swelling (inflammation) can be painful. This condition may come and go over time. What are the causes?  Injury to the hip.  Overuse of the muscles that surround the hip joint.  An earlier injury or surgery of the hip.  Arthritis or gout.  Diabetes.  Thyroid disease.  Infection.  In some cases, the cause may not be known. What are the signs or symptoms?  Mild or moderate pain in the hip area. Pain may get worse with movement.  Tenderness and swelling of the hip, especially on the outer side of the hip.  In rare cases, the bursa may become infected. This may cause: ? A fever. ? Warmth and redness in the area. Symptoms may come and go. How is this treated? This condition is treated by resting, icing, applying pressure (compression), and raising (elevating) the injured area. You may hear this called the RICE treatment. Treatment may also include:  Using crutches.  Draining fluid out of the bursa to help relieve swelling.  Giving a shot of (injecting) medicine that helps to reduce swelling (cortisone).  Other medicines if the bursa is infected. Follow these instructions at home: Managing pain, stiffness, and swelling   If told, put ice on the painful area. ? Put ice in a plastic bag. ? Place a towel between your skin and the bag. ? Leave the ice on for 20 minutes, 2-3 times a day. ? Raise (elevate) your hip above the level of your heart as much as you can without pain. To do this, try putting a pillow under your hips while you lie down. Stop if this causes pain. Activity  Return to your normal activities as told by your doctor. Ask your doctor what activities are safe for you.  Rest and protect your hip as much as you can until you feel better. General instructions  Take over-the-counter and prescription medicines only as told by your doctor.  Wear wraps that put pressure  on your hip (compression wraps) only as told by your doctor.  Do not use your hip to support your body weight until your doctor says that you can.  Use crutches as told by your doctor.  Gently rub and stretch your injured area as often as is comfortable.  Keep all follow-up visits as told by your doctor. This is important. How is this prevented?  Exercise regularly, as told by your doctor.  Warm up and stretch before being active.  Cool down and stretch after being active.  Avoid activities that bother your hip or cause pain.  Avoid sitting down for long periods at a time. Contact a doctor if:  You have a fever.  You get new symptoms.  You have trouble walking.  You have trouble doing everyday activities.  You have pain that gets worse.  You have pain that does not get better with medicine.  You get red skin on your hip area.  You get a feeling of warmth in your hip area. Get help right away if:  You cannot move your hip.  You have very bad pain. Summary  Hip bursitis is swelling of a fluid-filled sac (bursa) in your hip.  Hip bursitis can be painful.  Symptoms often come and go over time.  This condition is treated with rest, ice, compression, elevation, and medicines. This information is not intended to replace advice given to you by your health care provider.   Make sure you discuss any questions you have with your health care provider. Document Revised: 08/18/2018 Document Reviewed: 08/18/2018 Elsevier Patient Education  [place] Voltaren gel

## 2020-07-06 NOTE — Assessment & Plan Note (Addendum)
BP ok at home Coreg, Amlodipine, Losartan

## 2020-07-06 NOTE — Assessment & Plan Note (Signed)
R troch bursitis Ice Voltaren gel

## 2020-07-06 NOTE — Progress Notes (Signed)
Subjective:  Patient ID: Breanna [name], female    DOB: [birth year]  Age: 86 y.o. MRN: [id number]  CC: No chief complaint on file.   HPI [facility] presents for HTN, DM2, GERD f/u. C/o R hip pain  Outpatient Medications Prior to Visit  Medication Sig Dispense Refill  . Acetaminophen (TYLENOL ARTHRITIS PAIN PO) Take by mouth.    Marland Kitchen allopurinol (ZYLOPRIM) 300 MG tablet Take 0.5 tablets (150 mg total) by mouth daily. 90 tablet 3  . amLODipine (NORVASC) 5 MG tablet Take 1 tablet (5 mg total) by mouth daily. 90 tablet 3  . aspirin 81 MG EC tablet Take 81 mg by mouth daily.      Marland Kitchen atropine 1 % ophthalmic solution Place 1 drop into the left eye daily.    . brimonidine (ALPHAGAN) 0.15 % ophthalmic solution 1 drop.    Marland Kitchen CALCIUM PO Take by mouth daily.    . carvedilol (COREG) 25 MG tablet Take 1 tablet (25 mg total) by mouth 2 (two) times daily with a meal. 180 tablet 3  . Cholecalciferol (VITAMIN D3) 2000 UNITS capsule Take 2,000 Units by mouth daily.      . cyanocobalamin 2000 MCG tablet Take 2,000 mcg by mouth daily.    . diclofenac sodium (VOLTAREN) 1 % GEL Apply 2 g topically 2 (two) times daily. 200 g 3  . diphenhydramine-acetaminophen (TYLENOL PM) 25-500 MG TABS 1 tablet at bedtime as needed.    . dorzolamide-timolol (COSOPT) 22.3-6.8 MG/ML ophthalmic solution 1 drop.    Marland Kitchen gabapentin (NEURONTIN) 100 MG capsule Take 1 capsule (100 mg total) by mouth 3 (three) times daily. 270 capsule 3  . latanoprost (XALATAN) 0.005 % ophthalmic solution Place 1 drop into both eyes 2 (two) times daily.    . metFORMIN (GLUCOPHAGE) 500 MG tablet Take 1 tablet (500 mg total) by mouth daily with breakfast. 90 tablet 3  . NON FORMULARY CBD oil    . pantoprazole (PROTONIX) 40 MG tablet Take 1 tablet (40 mg total) by mouth daily. 90 tablet 3  . sertraline (ZOLOFT) 100 MG tablet Take 1 tablet (100 mg total) by mouth daily. 90 tablet 3  . spironolactone (ALDACTONE) 25 MG tablet Take 0.5 tablets (12.5 mg total)  by mouth daily. 90 tablet 3  . timolol (TIMOPTIC) 0.25 % ophthalmic solution     . torsemide (DEMADEX) 20 MG tablet TAKE 1/2 TABLET (10MG ) BY MOUTH DAILY AS NEEDED FOR EDEMA (SWELLING) 90 tablet 2   No facility-administered medications prior to visit.    ROS: Review of Systems  Constitutional: Negative for activity change, appetite change, chills, fatigue and unexpected weight change.  HENT: Negative for congestion, mouth sores and sinus pressure.   Eyes: Negative for visual disturbance.  Respiratory: Negative for cough and chest tightness.   Gastrointestinal: Negative for abdominal pain and nausea.  Genitourinary: Negative for difficulty urinating, frequency and vaginal pain.  Musculoskeletal: Positive for gait problem. Negative for back pain.  Skin: Negative for pallor and rash.  Neurological: Negative for dizziness, tremors, weakness, numbness and headaches.  Psychiatric/Behavioral: Negative for confusion and sleep disturbance.    Objective:  BP (!) 168/70 (BP Location: Right Arm, Patient Position: Sitting, Cuff Size: Normal)   Pulse (!) 48   Temp 97.9 F (36.6 C) (Oral)   Ht 5\' 2"  (1.575 m)   Wt 143 lb (64.9 kg)   SpO2 99%   BMI 26.16 kg/m   BP Readings from Last 3 Encounters:  07/06/20 (!) 168/70  03/01/20 (!) 150/62  11/02/19 134/76    Wt Readings from Last 3 Encounters:  07/06/20 143 lb (64.9 kg)  03/01/20 144 lb (65.3 kg)  11/02/19 144 lb (65.3 kg)    Physical Exam Constitutional:      General: She is not in acute distress.    Appearance: She is well-developed.  HENT:     Head: Normocephalic.     Right Ear: External ear normal.     Left Ear: External ear normal.     Nose: Nose normal.  Eyes:     General:        Right eye: No discharge.        Left eye: No discharge.     Conjunctiva/sclera: Conjunctivae normal.     Pupils: Pupils are equal, round, and reactive to light.  Neck:     Thyroid: No thyromegaly.     Vascular: No JVD.     Trachea: No  tracheal deviation.  Cardiovascular:     Rate and Rhythm: Normal rate and regular rhythm.     Heart sounds: Normal heart sounds.  Pulmonary:     Effort: No respiratory distress.     Breath sounds: No stridor. No wheezing.  Abdominal:     General: Bowel sounds are normal. There is no distension.     Palpations: Abdomen is soft. There is no mass.     Tenderness: There is no abdominal tenderness. There is no guarding or rebound.  Musculoskeletal:        General: No tenderness.     Cervical back: Normal range of motion and neck supple.  Lymphadenopathy:     Cervical: No cervical adenopathy.  Skin:    Findings: No erythema or rash.  Neurological:     Cranial Nerves: No cranial nerve deficit.     Motor: No abnormal muscle tone.     Coordination: Coordination abnormal.     Deep Tendon Reflexes: Reflexes normal.  Psychiatric:        Behavior: Behavior normal.        Thought Content: Thought content normal.        Judgment: Judgment normal.    Cane LS stiff R lat hip - very tender  Lab Results  Component Value Date   WBC 5.0 08/13/2018   HGB 11.0 (L) 08/13/2018   HCT 34.3 (L) 08/13/2018   PLT 133.0 (L) 08/13/2018   GLUCOSE 92 03/01/2020   CHOL 154 10/19/2014   TRIG 56.0 10/19/2014   HDL 61.10 10/19/2014   LDLCALC 82 10/19/2014   ALT 9 08/13/2017   AST 15 08/13/2017   NA 138 03/01/2020   K 5.5 (H) 03/01/2020   CL 107 03/01/2020   CREATININE 1.52 (H) 03/01/2020   BUN 35 (H) 03/01/2020   CO2 25 03/01/2020   TSH 1.16 04/04/2018   HGBA1C 6.2 03/01/2020    MM 3D SCREEN BREAST BILATERAL  Result Date: 07/23/2019 CLINICAL DATA:  Screening. EXAM: DIGITAL SCREENING BILATERAL MAMMOGRAM WITH TOMO AND CAD COMPARISON:  Previous exam(s). ACR Breast Density Category b: There are scattered areas of fibroglandular density. FINDINGS: There are no findings suspicious for malignancy. Images were processed with CAD. IMPRESSION: No mammographic evidence of malignancy. A result letter of this  screening mammogram will be mailed directly to the patient. RECOMMENDATION: Screening mammogram in one year. (Code:SM-B-01Y) BI-RADS CATEGORY  1: Negative. Electronically Signed   By: Ammie Ferrier M.D.   On: 07/23/2019 11:17    Assessment & Plan:   There are no diagnoses  linked to this encounter.   No orders of the defined types were placed in this encounter.    Follow-up: No follow-ups on file.  Walker Kehr, MD

## 2020-07-07 LAB — CBC WITH DIFFERENTIAL/PLATELET
Absolute Monocytes: 524 cells/uL (ref 200–950)
Basophils Absolute: 38 cells/uL (ref 0–200)
Basophils Relative: 0.7 %
Eosinophils Absolute: 265 cells/uL (ref 15–500)
Eosinophils Relative: 4.9 %
HCT: 33.4 % — ABNORMAL LOW (ref 35.0–45.0)
Hemoglobin: 10.2 g/dL — ABNORMAL LOW (ref 11.7–15.5)
Lymphs Abs: 1220 cells/uL (ref 850–3900)
MCH: 28.7 pg (ref 27.0–33.0)
MCHC: 30.5 g/dL — ABNORMAL LOW (ref 32.0–36.0)
MCV: 94.1 fL (ref 80.0–100.0)
MPV: 14.1 fL — ABNORMAL HIGH (ref 7.5–12.5)
Monocytes Relative: 9.7 %
Neutro Abs: 3353 cells/uL (ref 1500–7800)
Neutrophils Relative %: 62.1 %
Platelets: 162 10*3/uL (ref 140–400)
RBC: 3.55 10*6/uL — ABNORMAL LOW (ref 3.80–5.10)
RDW: 15.6 % — ABNORMAL HIGH (ref 11.0–15.0)
Total Lymphocyte: 22.6 %
WBC: 5.4 10*3/uL (ref 3.8–10.8)

## 2020-07-07 LAB — BASIC METABOLIC PANEL
BUN/Creatinine Ratio: 22 (calc) (ref 6–22)
BUN: 39 mg/dL — ABNORMAL HIGH (ref 7–25)
CO2: 23 mmol/L (ref 20–32)
Calcium: 9.8 mg/dL (ref 8.6–10.4)
Chloride: 109 mmol/L (ref 98–110)
Creat: 1.75 mg/dL — ABNORMAL HIGH (ref 0.60–0.88)
Glucose, Bld: 99 mg/dL (ref 65–99)
Potassium: 5.9 mmol/L — ABNORMAL HIGH (ref 3.5–5.3)
Sodium: 142 mmol/L (ref 135–146)

## 2020-07-07 LAB — HEMOGLOBIN A1C
Hgb A1c MFr Bld: 6.1 % of total Hgb — ABNORMAL HIGH (ref <5.7)
Mean Plasma Glucose: 128 (calc)
eAG (mmol/L): 7.1 (calc)

## 2020-07-07 LAB — HEPATIC FUNCTION PANEL
AG Ratio: 1.3 (calc) (ref 1.0–2.5)
ALT: 9 U/L (ref 6–29)
AST: 15 U/L (ref 10–35)
Albumin: 4 g/dL (ref 3.6–5.1)
Alkaline phosphatase (APISO): 94 U/L (ref 37–153)
Bilirubin, Direct: 0.1 mg/dL (ref 0.0–0.2)
Globulin: 3.1 g/dL (calc) (ref 1.9–3.7)
Indirect Bilirubin: 0.1 mg/dL (calc) — ABNORMAL LOW (ref 0.2–1.2)
Total Bilirubin: 0.2 mg/dL (ref 0.2–1.2)
Total Protein: 7.1 g/dL (ref 6.1–8.1)

## 2020-07-07 LAB — URINALYSIS
Bilirubin Urine: NEGATIVE
Glucose, UA: NEGATIVE
Hgb urine dipstick: NEGATIVE
Ketones, ur: NEGATIVE
Leukocytes,Ua: NEGATIVE
Nitrite: NEGATIVE
Specific Gravity, Urine: 1.012 (ref 1.001–1.03)
pH: 8 (ref 5.0–8.0)

## 2020-07-07 LAB — TSH: TSH: 1.26 mIU/L (ref 0.40–4.50)

## 2020-07-13 ENCOUNTER — Ambulatory Visit (INDEPENDENT_AMBULATORY_CARE_PROVIDER_SITE_OTHER): Payer: Medicare PPO

## 2020-07-13 DIAGNOSIS — Z0000 Encounter for general adult medical examination without abnormal findings: Secondary | ICD-10-CM

## 2020-07-13 NOTE — Patient Instructions (Addendum)
[name] , Thank you for taking time to come for your Medicare Wellness Visit. I appreciate your ongoing commitment to your health goals. Please review the following plan we discussed and let me know if I can assist you in the future.   Screening recommendations/referrals: Colonoscopy: no repeat due to age 86: 07/23/2019 Bone Density: 06/15/2015 Recommended yearly ophthalmology/optometry visit for glaucoma screening and checkup Recommended yearly dental visit for hygiene and checkup  Vaccinations: Influenza vaccine: 09/16/2019 Pneumococcal vaccine: completed Tdap vaccine: 05/13/2018 Shingles vaccine: Zoster completed   Covid-19: completed  Advanced directives: Please bring a copy of your health care power of attorney and living will to the office at your convenience.   Conditions/risks identified: Please continue to do your personal lifestyle choices by: daily care of teeth and gums, regular physical activity (goal should be 5 days a week for 30 minutes), eat a healthy diet, avoid tobacco and drug use, limiting any alcohol intake, taking a low-dose aspirin (if not allergic or have been advised by your provider otherwise) and taking vitamins and minerals as recommended by your provider. Continue doing brain stimulating activities (puzzles, reading, adult coloring books, staying active) to keep memory sharp. Continue to eat heart healthy diet (full of fruits, vegetables, whole grains, lean protein, water--limit salt, fat, and sugar intake) and increase physical activity as tolerated.  Next appointment: Please schedule your next Medicare Wellness Visit with your Nurse Health Advisor in 1 year.   Preventive Care 79 Years and Older, Female Preventive care refers to lifestyle choices and visits with your health care provider that can promote health and wellness. What does preventive care include?  A yearly physical exam. This is also called an annual well check.  Dental exams once or  twice a year.  Routine eye exams. Ask your health care provider how often you should have your eyes checked.  Personal lifestyle choices, including:  Daily care of your teeth and gums.  Regular physical activity.  Eating a healthy diet.  Avoiding tobacco and drug use.  Limiting alcohol use.  Practicing safe sex.  Taking low-dose aspirin every day.  Taking vitamin and mineral supplements as recommended by your health care provider. What happens during an annual well check? The services and screenings done by your health care provider during your annual well check will depend on your age, overall health, lifestyle risk factors, and family history of disease. Counseling  Your health care provider may ask you questions about your:  Alcohol use.  Tobacco use.  Drug use.  Emotional well-being.  Home and relationship well-being.  Sexual activity.  Eating habits.  History of falls.  Memory and ability to understand (cognition).  Work and work Statistician.  Reproductive health. Screening  You may have the following tests or measurements:  Height, weight, and BMI.  Blood pressure.  Lipid and cholesterol levels. These may be checked every 5 years, or more frequently if you are over 10 years old.  Skin check.  Lung cancer screening. You may have this screening every year starting at age 38 if you have a 30-pack-year history of smoking and currently smoke or have quit within the past 15 years.  Fecal occult blood test (FOBT) of the stool. You may have this test every year starting at age 59.  Flexible sigmoidoscopy or colonoscopy. You may have a sigmoidoscopy every 5 years or a colonoscopy every 10 years starting at age 19.  Hepatitis C blood test.  Hepatitis B blood test.  Sexually transmitted disease (STD)  testing.  Diabetes screening. This is done by checking your blood sugar (glucose) after you have not eaten for a while (fasting). You may have this done  every 1-3 years.  Bone density scan. This is done to screen for osteoporosis. You may have this done starting at age 20.  Mammogram. This may be done every 1-2 years. Talk to your health care provider about how often you should have regular mammograms. Talk with your health care provider about your test results, treatment options, and if necessary, the need for more tests. Vaccines  Your health care provider may recommend certain vaccines, such as:  Influenza vaccine. This is recommended every year.  Tetanus, diphtheria, and acellular pertussis (Tdap, Td) vaccine. You may need a Td booster every 10 years.  Zoster vaccine. You may need this after age 60.  Pneumococcal 13-valent conjugate (PCV13) vaccine. One dose is recommended after age 78.  Pneumococcal polysaccharide (PPSV23) vaccine. One dose is recommended after age 72. Talk to your health care provider about which screenings and vaccines you need and how often you need them. This information is not intended to replace advice given to you by your health care provider. Make sure you discuss any questions you have with your health care provider. Document Released: 01/06/2016 Document Revised: 08/29/2016 Document Reviewed: 10/11/2015 Elsevier Interactive Patient Education  2017 [place] Prevention in the Home Falls can cause injuries. They can happen to people of all ages. There are many things you can do to make your home safe and to help prevent falls. What can I do on the outside of my home?  Regularly fix the edges of walkways and driveways and fix any cracks.  Remove anything that might make you trip as you walk through a door, such as a raised step or threshold.  Trim any bushes or trees on the path to your home.  Use bright outdoor lighting.  Clear any walking paths of anything that might make someone trip, such as rocks or tools.  Regularly check to see if handrails are loose or broken. Make sure that both  sides of any steps have handrails.  Any raised decks and porches should have guardrails on the edges.  Have any leaves, snow, or ice cleared regularly.  Use sand or salt on walking paths during winter.  Clean up any spills in your garage right away. This includes oil or grease spills. What can I do in the bathroom?  Use night lights.  Install grab bars by the toilet and in the tub and shower. Do not use towel bars as grab bars.  Use non-skid mats or decals in the tub or shower.  If you need to sit down in the shower, use a plastic, non-slip stool.  Keep the floor dry. Clean up any water that spills on the floor as soon as it happens.  Remove soap buildup in the tub or shower regularly.  Attach bath mats securely with double-sided non-slip rug tape.  Do not have throw rugs and other things on the floor that can make you trip. What can I do in the bedroom?  Use night lights.  Make sure that you have a light by your bed that is easy to reach.  Do not use any sheets or blankets that are too big for your bed. They should not hang down onto the floor.  Have a firm chair that has side arms. You can use this for support while you get dressed.  Do not  have throw rugs and other things on the floor that can make you trip. What can I do in the kitchen?  Clean up any spills right away.  Avoid walking on wet floors.  Keep items that you use a lot in easy-to-reach places.  If you need to reach something above you, use a strong step stool that has a grab bar.  Keep electrical cords out of the way.  Do not use floor polish or wax that makes floors slippery. If you must use wax, use non-skid floor wax.  Do not have throw rugs and other things on the floor that can make you trip. What can I do with my stairs?  Do not leave any items on the stairs.  Make sure that there are handrails on both sides of the stairs and use them. Fix handrails that are broken or loose. Make sure that  handrails are as long as the stairways.  Check any carpeting to make sure that it is firmly attached to the stairs. Fix any carpet that is loose or worn.  Avoid having throw rugs at the top or bottom of the stairs. If you do have throw rugs, attach them to the floor with carpet tape.  Make sure that you have a light switch at the top of the stairs and the bottom of the stairs. If you do not have them, ask someone to add them for you. What else can I do to help prevent falls?  Wear shoes that:  Do not have high heels.  Have rubber bottoms.  Are comfortable and fit you well.  Are closed at the toe. Do not wear sandals.  If you use a stepladder:  Make sure that it is fully opened. Do not climb a closed stepladder.  Make sure that both sides of the stepladder are locked into place.  Ask someone to hold it for you, if possible.  Clearly mark and make sure that you can see:  Any grab bars or handrails.  First and last steps.  Where the edge of each step is.  Use tools that help you move around (mobility aids) if they are needed. These include:  Canes.  Walkers.  Scooters.  Crutches.  Turn on the lights when you go into a dark area. Replace any light bulbs as soon as they burn out.  Set up your furniture so you have a clear path. Avoid moving your furniture around.  If any of your floors are uneven, fix them.  If there are any pets around you, be aware of where they are.  Review your medicines with your doctor. Some medicines can make you feel dizzy. This can increase your chance of falling. Ask your doctor what other things that you can do to help prevent falls. This information is not intended to replace advice given to you by your health care provider. Make sure you discuss any questions you have with your health care provider. Document Released: 10/06/2009 Document Revised: 05/17/2016 Document Reviewed: 01/14/2015 Elsevier Interactive Patient Education  2017  [employer].

## 2020-07-13 NOTE — Progress Notes (Addendum)
I connected with Breanna [name] today by telephone and verified that I am speaking with the correct person using two identifiers. Location patient: home Location provider: work Persons participating in the virtual visit: Jeremy Mclamb and Lisette Abu, LPN   I discussed the limitations, risks, security and privacy concerns of performing an evaluation and management service by telephone and the availability of in person appointments. I also discussed with the patient that there may be a patient responsible charge related to this service. The patient expressed understanding and verbally consented to this telephonic visit.    Interactive audio and video telecommunications were attempted between this provider and patient, however failed, due to patient having technical difficulties OR patient did not have access to video capability.  We continued and completed visit with audio only.  Some vital signs may be absent or patient reported.   Time Spent with patient on telephone encounter: 25 minutes  Subjective:   Breanna [name] is a 86 y.o. female who presents for Medicare Annual (Subsequent) preventive examination.  Review of Systems    No ROS. Medicare Wellness Virtual Visit. Additional risk factors are reflected in social history. Cardiac Risk Factors include: advanced age (>72men, >20 women);diabetes mellitus;dyslipidemia;family history of premature cardiovascular disease;hypertension     Objective:    Today's Vitals   07/13/20 0821  PainSc: 3    There is no height or weight on file to calculate BMI.  Advanced Directives 07/13/2020 07/10/2019 07/07/2018 05/15/2018 07/04/2017 05/30/2015 01/12/2015  Does Patient Have a Medical Advance Directive? Yes Yes No No Yes Yes No  Type of Advance Directive Living will;Healthcare Power of [place];Living will - - [place];Living will - -  Does patient want to make changes to medical advance directive? No -  Guardian declined - - - - - -  Copy of [place] in Chart? No - copy requested No - copy requested - - No - copy requested Yes -  Would patient like information on creating a medical advance directive? - - No - Patient declined Yes (MAU/Ambulatory/Procedural Areas - Information given) - - No - patient declined information  Pre-existing out of facility DNR order (yellow form or pink MOST form) - - - - - - -    Current Medications (verified) Outpatient Encounter Medications as of 07/13/2020  Medication Sig   Acetaminophen (TYLENOL ARTHRITIS PAIN PO) Take by mouth.   allopurinol (ZYLOPRIM) 300 MG tablet Take 0.5 tablets (150 mg total) by mouth daily.   amLODipine (NORVASC) 5 MG tablet Take 1 tablet (5 mg total) by mouth daily.   aspirin 81 MG EC tablet Take 81 mg by mouth daily.     atropine 1 % ophthalmic solution Place 1 drop into the left eye daily.   brimonidine (ALPHAGAN) 0.15 % ophthalmic solution 1 drop.   CALCIUM PO Take by mouth daily.   carvedilol (COREG) 25 MG tablet Take 1 tablet (25 mg total) by mouth 2 (two) times daily with a meal.   Cholecalciferol (VITAMIN D3) 2000 UNITS capsule Take 2,000 Units by mouth daily.     cyanocobalamin 2000 MCG tablet Take 2,000 mcg by mouth daily.   diclofenac sodium (VOLTAREN) 1 % GEL Apply 2 g topically 2 (two) times daily.   diphenhydramine-acetaminophen (TYLENOL PM) 25-500 MG TABS 1 tablet at bedtime as needed.   dorzolamide-timolol (COSOPT) 22.3-6.8 MG/ML ophthalmic solution 1 drop.   gabapentin (NEURONTIN) 100 MG capsule Take 1 capsule (100 mg total) by  mouth 3 (three) times daily.   latanoprost (XALATAN) 0.005 % ophthalmic solution Place 1 drop into both eyes 2 (two) times daily.   metFORMIN (GLUCOPHAGE) 500 MG tablet Take 1 tablet (500 mg total) by mouth daily with breakfast.   NON FORMULARY CBD oil   pantoprazole (PROTONIX) 40 MG tablet Take 1 tablet (40 mg total) by mouth daily.   sertraline (ZOLOFT) 100 MG tablet Take  1 tablet (100 mg total) by mouth daily.   spironolactone (ALDACTONE) 25 MG tablet Take 0.5 tablets (12.5 mg total) by mouth daily.   timolol (TIMOPTIC) 0.25 % ophthalmic solution    torsemide (DEMADEX) 20 MG tablet TAKE 1/2 TABLET (10MG ) BY MOUTH DAILY AS NEEDED FOR EDEMA (SWELLING)   No facility-administered encounter medications on file as of 07/13/2020.    Allergies (verified) Aspirin, Atenolol, Codeine, Codeine sulfate, Hydrochlorothiazide, Hydrocodone, Ibuprofen, Lisinopril, Onion, Valsartan, Verapamil, and Adhesive  [tape]   History: Past Medical History:  Diagnosis Date   Adrenal adenoma 2006   Boils 2009   Cholelithiasis    asympt. w/normal HIDA 03/2010   CVA (cerebral infarction) 2010   Cerebellar   Diverticulitis    Esophageal spasm 2011   GERD (gastroesophageal reflux disease)    Gout    History of colon polyps    HTN (hypertension)    Hydronephrosis    LEFT/ Surgical intervention   Hyperlipidemia    LBP (low back pain)    Osteoarthritis    Pulmonary HTN ([place])    Stress    Type II or unspecified type diabetes mellitus without mention of complication, not stated as uncontrolled    Past Surgical History:  Procedure Laterality Date   ABDOMINAL HYSTERECTOMY     APPENDECTOMY     2020   BACK SURGERY     BREAST BIOPSY     RIGHT   CATARACT EXTRACTION, BILATERAL     COLECTOMY  2006   Sigmoid   FOOT SURGERY     BILATERAL   HAMMER TOE SURGERY     ROTATOR CUFF REPAIR  2008   RIGHT   TOTAL KNEE ARTHROPLASTY  2003   LEFT   VARICOSE VEIN SURGERY     vein stripping/lower extremities   Family History  Problem Relation Age of Onset   Prostate cancer Father    Hypertension Father    Cancer Father        prostate   Heart disease Mother    Diabetes Mother    Diabetes Other        1st degree relative   Heart disease Other    Hypertension Other    Prostate cancer Maternal Uncle    Cancer Maternal Uncle        prostate   Breast cancer Daughter    Cancer  Daughter        breast   Colon cancer Neg Hx    Social History   Socioeconomic History   Marital status: Widowed    Spouse name: Breanna [name]   Number of children: 1   Years of education: Not on file   Highest education level: Not on file  Occupational History   Occupation: Retired    Fish farm manager: RETIRED  Tobacco Use   Smoking status: Never Smoker   Smokeless tobacco: Never Used  Scientific laboratory technician Use: Never used  Substance and Sexual Activity   Alcohol use: No    Alcohol/week: 0.0 standard drinks   Drug use: No   Sexual activity: Not Currently  Other Topics Concern   Not on file  Social History Narrative   Not on file   Social Determinants of Health   Financial Resource Strain: Low Risk    Difficulty of Paying Living Expenses: Not hard at all  Food Insecurity: No Food Insecurity   Worried About Charity fundraiser in the Last Year: Never true   Arboriculturist in the Last Year: Never true  Transportation Needs: Unmet Transportation Needs   Lack of Transportation (Medical): Yes   Lack of Transportation (Non-Medical): Yes  Physical Activity: Inactive   Days of Exercise per Week: 0 days   Minutes of Exercise per Session: 0 min  Stress: No Stress Concern Present   Feeling of Stress : Not at all  Social Connections: Moderately Integrated   Frequency of Communication with Friends and Family: More than three times a week   Frequency of Social Gatherings with Friends and Family: More than three times a week   Attends Religious Services: More than 4 times per year   Active Member of [employer] or Organizations: Yes   Attends Archivist Meetings: More than 4 times per year   Marital Status: Widowed    Tobacco Counseling Counseling given: Not Answered   Clinical Intake:  Pre-visit preparation completed: Yes  Pain : 0-10 Pain Score: 3  Pain Location: Hip Pain Orientation: Right Pain Radiating Towards: right hip Pain Descriptors / Indicators: Nagging,  Discomfort Pain Onset: More than a month ago Pain Frequency: Intermittent Pain Relieving Factors: Tylenol Effect of Pain on Daily Activities: Yes  Pain Relieving Factors: Tylenol  Nutritional Risks: None Diabetes: Yes CBG done?: No Did pt. bring in CBG monitor from home?: No  How often do you need to have someone help you when you read instructions, pamphlets, or other written materials from your doctor or pharmacy?: 1 - Never What is the last grade level you completed in school?: High School Graduate  Diabetic? yes  Interpreter Needed?: No  Information entered by :: Allyse Fregeau N. Donna Snooks, LPN   Activities of Daily Living In your present state of health, do you have any difficulty performing the following activities: 07/13/2020  Hearing? N  Vision? Y  Difficulty concentrating or making decisions? N  Walking or climbing stairs? N  Comment has ramps  Dressing or bathing? N  Doing errands, shopping? N  Comment need help with transportation  Preparing Food and eating ? N  Using the Toilet? N  In the past six months, have you accidently leaked urine? Y  Comment wears adult diapers; needs help buying in bulk  Do you have problems with loss of bowel control? N  Managing your Medications? N  Managing your Finances? N  Housekeeping or managing your Housekeeping? N  Some recent data might be hidden    Patient Care Team: Plotnikov, Evie Lacks, MD as PCP - General (Internal Medicine) Carolan Clines, MD (Inactive) as Consulting Physician (Urology) Melina Schools, MD as Consulting Physician (Orthopedic Surgery) Teofilo Pod, MD as Referring Physician (Ophthalmology) Marilynne Halsted, MD as Referring Physician (Ophthalmology)  Indicate any recent Medical Services you may have received from other than [facility] providers in the past year (date may be approximate).     Assessment:   This is a routine wellness examination for Breanna [name].  Hearing/Vision screen No exam data  present  Dietary issues and exercise activities discussed: Current Exercise Habits: The patient does not participate in regular exercise at present, Exercise limited by: orthopedic condition(s);Other - see  comments (right hip pain and vision problems)  Goals       I want to increase the amount of water I drink      I fill my water bottle up at least twice daily and drink it. Continue to be socially engaged with family and friends. Enjoy life      Patient Stated (pt-stated)      I would like to get back in the [facility] to exercise more.        Depression Screen PHQ 2/9 Scores 07/13/2020 07/10/2019 07/07/2018 07/04/2017 06/07/2017 06/04/2016 05/30/2015  PHQ - 2 Score 0 0 0 0 0 0 0  Exception Documentation - - - - - - Other- indicate reason in comment box    Fall Risk Fall Risk  07/13/2020 07/10/2019 07/07/2018 07/04/2017 06/07/2017  Falls in the past year? 0 0 Yes No No  Comment - - - - -  Number falls in past yr: 0 1 1 - -  Injury with Fall? 0 1 No - -  Risk for fall due to : No Fall Risks Impaired vision;Impaired mobility;Impaired balance/gait Impaired mobility;Impaired balance/gait - -  Follow up Falls evaluation completed - Education provided;Falls prevention discussed - -    Any stairs in or around the home? No  If so, are there any without handrails? No  Home free of loose throw rugs in walkways, pet beds, electrical cords, etc? Yes  Adequate lighting in your home to reduce risk of falls? Yes   ASSISTIVE DEVICES UTILIZED TO PREVENT FALLS:  Life alert? No  Use of a cane, walker or w/c? Yes  Grab bars in the bathroom? Yes  Shower chair or bench in shower? Yes  Elevated toilet seat or a handicapped toilet? Yes   TIMED UP AND GO:  Was the test performed? No .  Length of time to ambulate 10 feet: 0 sec.   Gait steady and fast with assistive device  Cognitive Function: MMSE - Mini Mental State Exam 07/07/2018 07/04/2017 05/30/2015  Not completed: - - Unable to complete  Orientation to  time 5 5 -  Orientation to Place 5 5 -  Registration 3 3 -  Attention/ Calculation 4 4 -  Recall 1 2 -  Language- name 2 objects 2 2 -  Language- repeat 1 1 -  Language- follow 3 step command 3 3 -  Language- read & follow direction 1 1 -  Write a sentence 1 1 -  Copy design 1 1 -  Total score 27 28 -        Immunizations Immunization History  Administered Date(s) Administered   Fluad Quad(high Dose 65+) 09/16/2019   H1N1 01/06/2009   Influenza Split 10/01/2011, 09/08/2012   Influenza Whole 10/07/2009, 09/29/2010   Influenza, High Dose Seasonal PF 10/06/2013, 11/21/2016, 09/25/2017, 09/22/2018   Influenza-Unspecified 10/09/2014, 09/09/2015   PFIZER SARS-COV-2 Vaccination 01/12/2020, 02/02/2020   Pneumococcal Conjugate-13 04/07/2014   Pneumococcal Polysaccharide-23 06/26/2007, 03/14/2018   Td 06/26/2007   Tdap 05/13/2018   Zoster 10/04/2011    TDAP status: Up to date Flu Vaccine status: Up to date Pneumococcal vaccine status: Up to date Covid-19 vaccine status: Completed vaccines  Qualifies for Shingles Vaccine? Yes   Zostavax completed Yes   Shingrix Completed?: No.    Education has been provided regarding the importance of this vaccine. Patient has been advised to call insurance company to determine out of pocket expense if they have not yet received this vaccine. Advised may also receive vaccine at local  pharmacy or Health Dept. Verbalized acceptance and understanding.  Screening Tests Health Maintenance  Topic Date Due   URINE MICROALBUMIN  Never done   OPHTHALMOLOGY EXAM  02/25/2019   FOOT EXAM  07/08/2019   INFLUENZA VACCINE  07/24/2020   HEMOGLOBIN A1C  01/06/2021   TETANUS/TDAP  05/13/2028   DEXA SCAN  Completed   COVID-19 Vaccine  Completed   PNA vac Low Risk Adult  Completed    Health Maintenance  Health Maintenance Due  Topic Date Due   URINE MICROALBUMIN  Never done   OPHTHALMOLOGY EXAM  02/25/2019   FOOT EXAM  07/08/2019    Colorectal  cancer screening: No longer required.  Mammogram status: Completed 07/23/2019. Repeat every year Bone Density status: Completed 06/15/2015. Results reflect: Bone density results: OSTEOPOROSIS. Repeat every 2-3 years.  Lung Cancer Screening: (Low Dose CT Chest recommended if Age 21-80 years, 30 pack-year currently smoking OR have quit w/in 15years.) does not qualify.   Lung Cancer Screening Referral: no  Additional Screening:  Hepatitis C Screening: does not qualify; Completed no  Vision Screening: Recommended annual ophthalmology exams for early detection of glaucoma and other disorders of the eye. Is the patient up to date with their annual eye exam?  Yes  Who is the provider or what is the name of the office in which the patient attends annual eye exams? Marilynne Halsted, MD If pt is not established with a provider, would they like to be referred to a provider to establish care? No .   Dental Screening: Recommended annual dental exams for proper oral hygiene  Community Resource Referral / Chronic Care Management: CRR required this visit?  Yes   CCM required this visit?  Yes      Plan:     I have personally reviewed and noted the following in the patient's chart:   Medical and social history Use of alcohol, tobacco or illicit drugs  Current medications and supplements Functional ability and status Nutritional status Physical activity Advanced directives List of other physicians Hospitalizations, surgeries, and ER visits in previous 12 months Vitals Screenings to include cognitive, depression, and falls Referrals and appointments  In addition, I have reviewed and discussed with patient certain preventive protocols, quality metrics, and best practice recommendations. A written personalized care plan for preventive services as well as general preventive health recommendations were provided to patient.     Sheral Flow, LPN   2/83/1517   Nurse Notes:  Patient is  cogitatively intact. There were no vitals filed for this visit. There is no height or weight on file to calculate BMI. Patient stated that she has issues with gait or balance; does use assistive devices such as a cane and walker. [id number] due to lack of transportation and also help with getting Adult Diapers in bulk.  Medical screening examination/treatment/procedure(s) were performed by non-physician practitioner and as supervising physician I was immediately available for consultation/collaboration.  I agree with above. Lew Dawes, MD

## 2020-07-20 ENCOUNTER — Emergency Department (HOSPITAL_COMMUNITY): Payer: Medicare PPO

## 2020-07-20 ENCOUNTER — Inpatient Hospital Stay (HOSPITAL_COMMUNITY)
Admission: EM | Admit: 2020-07-20 | Discharge: 2020-07-29 | DRG: 481 | Disposition: A | Discharge status: Skilled Nursing Facility | Payer: Medicare PPO | Attending: Internal Medicine | Admitting: Internal Medicine

## 2020-07-20 ENCOUNTER — Other Ambulatory Visit: Payer: Self-pay

## 2020-07-20 DIAGNOSIS — H401134 Primary open-angle glaucoma, bilateral, indeterminate stage: Secondary | ICD-10-CM | POA: Diagnosis present

## 2020-07-20 DIAGNOSIS — I129 Hypertensive chronic kidney disease with stage 1 through stage 4 chronic kidney disease, or unspecified chronic kidney disease: Secondary | ICD-10-CM | POA: Diagnosis present

## 2020-07-20 DIAGNOSIS — E1122 Type 2 diabetes mellitus with diabetic chronic kidney disease: Secondary | ICD-10-CM | POA: Diagnosis present

## 2020-07-20 DIAGNOSIS — Z79899 Other long term (current) drug therapy: Secondary | ICD-10-CM

## 2020-07-20 DIAGNOSIS — S72001A Fracture of unspecified part of neck of right femur, initial encounter for closed fracture: Secondary | ICD-10-CM

## 2020-07-20 DIAGNOSIS — D62 Acute posthemorrhagic anemia: Secondary | ICD-10-CM | POA: Diagnosis not present

## 2020-07-20 DIAGNOSIS — Z419 Encounter for procedure for purposes other than remedying health state, unspecified: Secondary | ICD-10-CM

## 2020-07-20 DIAGNOSIS — R509 Fever, unspecified: Secondary | ICD-10-CM | POA: Diagnosis not present

## 2020-07-20 DIAGNOSIS — S3992XA Unspecified injury of lower back, initial encounter: Secondary | ICD-10-CM | POA: Diagnosis not present

## 2020-07-20 DIAGNOSIS — Z86010 Personal history of colonic polyps: Secondary | ICD-10-CM | POA: Diagnosis not present

## 2020-07-20 DIAGNOSIS — I6350 Cerebral infarction due to unspecified occlusion or stenosis of unspecified cerebral artery: Secondary | ICD-10-CM | POA: Diagnosis present

## 2020-07-20 DIAGNOSIS — E1129 Type 2 diabetes mellitus with other diabetic kidney complication: Secondary | ICD-10-CM | POA: Diagnosis present

## 2020-07-20 DIAGNOSIS — S72009A Fracture of unspecified part of neck of unspecified femur, initial encounter for closed fracture: Secondary | ICD-10-CM | POA: Diagnosis present

## 2020-07-20 DIAGNOSIS — Z20822 Contact with and (suspected) exposure to covid-19: Secondary | ICD-10-CM | POA: Diagnosis present

## 2020-07-20 DIAGNOSIS — Z7982 Long term (current) use of aspirin: Secondary | ICD-10-CM | POA: Diagnosis not present

## 2020-07-20 DIAGNOSIS — M545 Low back pain, unspecified: Secondary | ICD-10-CM

## 2020-07-20 DIAGNOSIS — S72001D Fracture of unspecified part of neck of right femur, subsequent encounter for closed fracture with routine healing: Secondary | ICD-10-CM | POA: Diagnosis not present

## 2020-07-20 DIAGNOSIS — E785 Hyperlipidemia, unspecified: Secondary | ICD-10-CM | POA: Diagnosis present

## 2020-07-20 DIAGNOSIS — R52 Pain, unspecified: Secondary | ICD-10-CM | POA: Diagnosis not present

## 2020-07-20 DIAGNOSIS — S72044A Nondisplaced fracture of base of neck of right femur, initial encounter for closed fracture: Secondary | ICD-10-CM | POA: Diagnosis not present

## 2020-07-20 DIAGNOSIS — D696 Thrombocytopenia, unspecified: Secondary | ICD-10-CM | POA: Diagnosis present

## 2020-07-20 DIAGNOSIS — W010XXA Fall on same level from slipping, tripping and stumbling without subsequent striking against object, initial encounter: Secondary | ICD-10-CM | POA: Diagnosis present

## 2020-07-20 DIAGNOSIS — Z9049 Acquired absence of other specified parts of digestive tract: Secondary | ICD-10-CM | POA: Diagnosis not present

## 2020-07-20 DIAGNOSIS — M109 Gout, unspecified: Secondary | ICD-10-CM | POA: Diagnosis present

## 2020-07-20 DIAGNOSIS — Z96652 Presence of left artificial knee joint: Secondary | ICD-10-CM | POA: Diagnosis present

## 2020-07-20 DIAGNOSIS — S72141A Displaced intertrochanteric fracture of right femur, initial encounter for closed fracture: Secondary | ICD-10-CM | POA: Diagnosis present

## 2020-07-20 DIAGNOSIS — Z947 Corneal transplant status: Secondary | ICD-10-CM

## 2020-07-20 DIAGNOSIS — S72141D Displaced intertrochanteric fracture of right femur, subsequent encounter for closed fracture with routine healing: Secondary | ICD-10-CM | POA: Diagnosis not present

## 2020-07-20 DIAGNOSIS — Z885 Allergy status to narcotic agent status: Secondary | ICD-10-CM

## 2020-07-20 DIAGNOSIS — Z91018 Allergy to other foods: Secondary | ICD-10-CM

## 2020-07-20 DIAGNOSIS — M810 Age-related osteoporosis without current pathological fracture: Secondary | ICD-10-CM | POA: Diagnosis present

## 2020-07-20 DIAGNOSIS — Z888 Allergy status to other drugs, medicaments and biological substances status: Secondary | ICD-10-CM

## 2020-07-20 DIAGNOSIS — Z8249 Family history of ischemic heart disease and other diseases of the circulatory system: Secondary | ICD-10-CM

## 2020-07-20 DIAGNOSIS — K219 Gastro-esophageal reflux disease without esophagitis: Secondary | ICD-10-CM | POA: Diagnosis present

## 2020-07-20 DIAGNOSIS — I10 Essential (primary) hypertension: Secondary | ICD-10-CM | POA: Diagnosis not present

## 2020-07-20 DIAGNOSIS — S59901A Unspecified injury of right elbow, initial encounter: Secondary | ICD-10-CM | POA: Diagnosis not present

## 2020-07-20 DIAGNOSIS — E1140 Type 2 diabetes mellitus with diabetic neuropathy, unspecified: Secondary | ICD-10-CM | POA: Diagnosis not present

## 2020-07-20 DIAGNOSIS — Z981 Arthrodesis status: Secondary | ICD-10-CM | POA: Diagnosis not present

## 2020-07-20 DIAGNOSIS — E119 Type 2 diabetes mellitus without complications: Secondary | ICD-10-CM | POA: Diagnosis present

## 2020-07-20 DIAGNOSIS — D638 Anemia in other chronic diseases classified elsewhere: Secondary | ICD-10-CM | POA: Diagnosis present

## 2020-07-20 DIAGNOSIS — Z4789 Encounter for other orthopedic aftercare: Secondary | ICD-10-CM | POA: Diagnosis not present

## 2020-07-20 DIAGNOSIS — Z7984 Long term (current) use of oral hypoglycemic drugs: Secondary | ICD-10-CM

## 2020-07-20 DIAGNOSIS — E1121 Type 2 diabetes mellitus with diabetic nephropathy: Secondary | ICD-10-CM | POA: Diagnosis not present

## 2020-07-20 DIAGNOSIS — R609 Edema, unspecified: Secondary | ICD-10-CM | POA: Diagnosis not present

## 2020-07-20 DIAGNOSIS — Z8673 Personal history of transient ischemic attack (TIA), and cerebral infarction without residual deficits: Secondary | ICD-10-CM | POA: Diagnosis not present

## 2020-07-20 DIAGNOSIS — N1832 Chronic kidney disease, stage 3b: Secondary | ICD-10-CM | POA: Diagnosis present

## 2020-07-20 DIAGNOSIS — I959 Hypotension, unspecified: Secondary | ICD-10-CM | POA: Diagnosis not present

## 2020-07-20 DIAGNOSIS — N183 Chronic kidney disease, stage 3 unspecified: Secondary | ICD-10-CM | POA: Diagnosis not present

## 2020-07-20 DIAGNOSIS — W19XXXA Unspecified fall, initial encounter: Secondary | ICD-10-CM | POA: Diagnosis not present

## 2020-07-20 DIAGNOSIS — Z833 Family history of diabetes mellitus: Secondary | ICD-10-CM

## 2020-07-20 DIAGNOSIS — M25551 Pain in right hip: Secondary | ICD-10-CM | POA: Diagnosis not present

## 2020-07-20 DIAGNOSIS — Z91048 Other nonmedicinal substance allergy status: Secondary | ICD-10-CM

## 2020-07-20 DIAGNOSIS — M778 Other enthesopathies, not elsewhere classified: Secondary | ICD-10-CM | POA: Diagnosis not present

## 2020-07-20 DIAGNOSIS — R001 Bradycardia, unspecified: Secondary | ICD-10-CM | POA: Diagnosis present

## 2020-07-20 DIAGNOSIS — T1490XA Injury, unspecified, initial encounter: Secondary | ICD-10-CM

## 2020-07-20 DIAGNOSIS — J90 Pleural effusion, not elsewhere classified: Secondary | ICD-10-CM | POA: Diagnosis not present

## 2020-07-20 DIAGNOSIS — Z886 Allergy status to analgesic agent status: Secondary | ICD-10-CM

## 2020-07-20 DIAGNOSIS — S72001S Fracture of unspecified part of neck of right femur, sequela: Secondary | ICD-10-CM | POA: Diagnosis not present

## 2020-07-20 LAB — CBC WITH DIFFERENTIAL/PLATELET
Abs Immature Granulocytes: 0.05 10*3/uL (ref 0.00–0.07)
Basophils Absolute: 0 10*3/uL (ref 0.0–0.1)
Basophils Relative: 0 %
Eosinophils Absolute: 0.3 10*3/uL (ref 0.0–0.5)
Eosinophils Relative: 4 %
HCT: 30.8 % — ABNORMAL LOW (ref 36.0–46.0)
Hemoglobin: 9.7 g/dL — ABNORMAL LOW (ref 12.0–15.0)
Immature Granulocytes: 1 %
Lymphocytes Relative: 15 %
Lymphs Abs: 0.9 10*3/uL (ref 0.7–4.0)
MCH: 28.5 pg (ref 26.0–34.0)
MCHC: 31.5 g/dL (ref 30.0–36.0)
MCV: 90.6 fL (ref 80.0–100.0)
Monocytes Absolute: 0.4 10*3/uL (ref 0.1–1.0)
Monocytes Relative: 6 %
Neutro Abs: 4.4 10*3/uL (ref 1.7–7.7)
Neutrophils Relative %: 74 %
Platelets: 98 10*3/uL — ABNORMAL LOW (ref 150–400)
RBC: 3.4 MIL/uL — ABNORMAL LOW (ref 3.87–5.11)
RDW: 16.7 % — ABNORMAL HIGH (ref 11.5–15.5)
WBC: 6 10*3/uL (ref 4.0–10.5)
nRBC: 0.5 % — ABNORMAL HIGH (ref 0.0–0.2)

## 2020-07-20 LAB — COMPREHENSIVE METABOLIC PANEL
ALT: 12 U/L (ref 0–44)
AST: 23 U/L (ref 15–41)
Albumin: 3.4 g/dL — ABNORMAL LOW (ref 3.5–5.0)
Alkaline Phosphatase: 81 U/L (ref 38–126)
Anion gap: 10 (ref 5–15)
BUN: 30 mg/dL — ABNORMAL HIGH (ref 8–23)
CO2: 22 mmol/L (ref 22–32)
Calcium: 9.2 mg/dL (ref 8.9–10.3)
Chloride: 108 mmol/L (ref 98–111)
Creatinine, Ser: 1.63 mg/dL — ABNORMAL HIGH (ref 0.44–1.00)
GFR calc Af Amer: 33 mL/min — ABNORMAL LOW (ref >60)
GFR calc non Af Amer: 28 mL/min — ABNORMAL LOW (ref >60)
Glucose, Bld: 158 mg/dL — ABNORMAL HIGH (ref 70–99)
Potassium: 4.6 mmol/L (ref 3.5–5.1)
Sodium: 140 mmol/L (ref 135–145)
Total Bilirubin: 0.5 mg/dL (ref 0.3–1.2)
Total Protein: 6.4 g/dL — ABNORMAL LOW (ref 6.5–8.1)

## 2020-07-20 MED ORDER — FENTANYL CITRATE (PF) 100 MCG/2ML IJ SOLN
50.0000 ug | Freq: Once | INTRAMUSCULAR | Status: AC | PRN
  Administered 2020-07-20: 50 ug via INTRAVENOUS
  Filled 2020-07-20: qty 2

## 2020-07-20 MED ORDER — FENTANYL CITRATE (PF) 100 MCG/2ML IJ SOLN
50.0000 ug | Freq: Once | INTRAMUSCULAR | Status: DC | PRN
  Filled 2020-07-20: qty 2

## 2020-07-20 NOTE — ED Triage Notes (Signed)
Pt fell while watering her flowers and made a quick turn. She fell on her right hip and right arm. NO LOC, No thinners. EMS BP 134/80. 50 mcg of fentanyl given for pain.

## 2020-07-20 NOTE — ED Provider Notes (Signed)
[facility] EMERGENCY DEPARTMENT Provider Note   CSN: [id number] Arrival date & time: 07/20/20  2047     History Chief Complaint  Patient presents with  . Fall    [address] Breanna [name] is a 86 y.o. female with a past medical history of pulmonary hypertension, DM, CVA, hypertension, who presents today for evaluation of pain in her right hip.  She reports that she was outside watering her flowers when she made a quick turn causing her to fall landing on her hip.  She denies striking her head.  She denies any prodromal events.  She denies any neck pain, chest pain or abdominal pain.  She does not take any blood thinning medications.  She reports pain only in her right hip and her right elbow.  The right elbow pain is mild and she states is a scrape.  She has been unable to ambulate since the fall.  The pain is made worse with movement.  Was made better with the 50 MCG's of fentanyl given by EMS in route.    Her last oral intake was about 15 minutes prior to her fall when she had a salad and chicken sandwich.  Of note chart review shows that patient is usually bradycardic with Hrs in the 40s.  HPI     Past Medical History:  Diagnosis Date  . Adrenal adenoma 2006  . Boils 2009  . Cholelithiasis    asympt. w/normal HIDA 03/2010  . CVA (cerebral infarction) 2010   Cerebellar  . Diverticulitis   . Esophageal spasm 2011  . GERD (gastroesophageal reflux disease)   . Gout   . History of colon polyps   . HTN (hypertension)   . Hydronephrosis    LEFT/ Surgical intervention  . Hyperlipidemia   . LBP (low back pain)   . Osteoarthritis   . Pulmonary HTN ([place])   . Stress   . Type II or unspecified type diabetes mellitus without mention of complication, not stated as uncontrolled     Patient Active Problem List   Diagnosis Date Noted  . Hip fracture ([place]) 07/21/2020  . Sinus bradycardia 07/21/2020  . Hip pain 07/06/2020  . Fall against object 12/25/2018  . Head  contusion 12/25/2018  . Acute pain of right knee 09/28/2018  . Well adult exam 05/13/2018  . Herpes labialis 05/13/2018  . Night sweats 04/04/2018  . Breast symptom 10/30/2017  . Diarrhea 08/21/2017  . UTI (urinary tract infection) 08/21/2017  . Lung nodule 08/21/2017  . FTT (failure to thrive) in adult 08/21/2017  . Shortness of breath 08/13/2017  . Cough 08/13/2017  . Fatigue 08/13/2017  . Fever 08/13/2017  . Blurry vision 08/13/2017  . Shoulder pain, right 07/25/2017  . Callus of foot 06/07/2017  . Need for prophylactic vaccination and inoculation against influenza 11/21/2016  . Trigger finger, acquired 11/12/2016  . Meteorism 07/11/2016  . Dark stools 01/10/2016  . Osteoporosis 07/13/2015  . Fall at home 01/13/2015  . Cornea disorder 11/22/2014  . Primary open angle glaucoma of both eyes, indeterminate stage 11/22/2014  . Secondary corneal edema 10/14/2014  . Swelling of left knee joint 04/07/2014  . Grief 01/04/2014  . Syncope 01/04/2014  . Thoracic spine pain 10/22/2013  . Hypernatremia 07/07/2013  . Viral intestinal infection 03/25/2013  . Dehydration, moderate 03/24/2013  . Food poisoning 03/24/2013  . Headache(784.0) 12/22/2012  . URI (upper respiratory infection) 10/21/2012  . Diabetic neuropathy ([place]) 07/30/2012  . Neck pain on right side 07/30/2012  .  Cerumen impaction 07/17/2012  . Cystoid macular edema 06/19/2012  . History of corneal transplant 06/19/2012  . Weight loss 04/10/2012  . Wrist pain, right 04/06/2011  . PARESTHESIA 01/03/2011  . FULL INCONTINENCE OF FECES 01/01/2011  . ABDOMINAL PAIN RIGHT UPPER QUADRANT 01/01/2011  . LEG PAIN, LEFT 10/02/2010  . Cerebral artery occlusion with cerebral infarction ([place]) 03/27/2010  . GERD 03/27/2010  . DIVERTICULOSIS-COLON 03/27/2010  . CHOLELITHIASIS 03/27/2010  . LOW BACK PAIN 09/07/2009  . ANEMIA, IRON DEFICIENCY, HX OF 06/14/2009  . DYSPHAGIA 05/16/2009  . Dysphagia 05/16/2009  . Dyslipidemia  11/06/2007  . Type 2 diabetes mellitus with renal manifestations, controlled ([place]) 07/29/2007  . Gout 07/29/2007  . Essential hypertension 07/29/2007  . HYDRONEPHROSIS 07/29/2007  . Osteoarthritis 07/29/2007  . COLONIC POLYPS, HX OF 07/29/2007  . DIVERTICULITIS, HX OF 07/29/2007    Past Surgical History:  Procedure Laterality Date  . ABDOMINAL HYSTERECTOMY    . APPENDECTOMY     2020  . BACK SURGERY    . BREAST BIOPSY     RIGHT  . CATARACT EXTRACTION, BILATERAL    . COLECTOMY  2006   Sigmoid  . FOOT SURGERY     BILATERAL  . HAMMER TOE SURGERY    . ROTATOR CUFF REPAIR  2008   RIGHT  . TOTAL KNEE ARTHROPLASTY  2003   LEFT  . VARICOSE VEIN SURGERY     vein stripping/lower extremities     OB History   No obstetric history on file.     Family History  Problem Relation Age of Onset  . Prostate cancer Father   . Hypertension Father   . Cancer Father        prostate  . Heart disease Mother   . Diabetes Mother   . Diabetes Other        1st degree relative  . Heart disease Other   . Hypertension Other   . Prostate cancer Maternal Uncle   . Cancer Maternal Uncle        prostate  . Breast cancer Daughter   . Cancer Daughter        breast  . Colon cancer Neg Hx     Social History   Tobacco Use  . Smoking status: Never Smoker  . Smokeless tobacco: Never Used  Vaping Use  . Vaping Use: Never used  Substance Use Topics  . Alcohol use: No    Alcohol/week: 0.0 standard drinks  . Drug use: No    Home Medications Prior to Admission medications   Medication Sig Start Date End Date Taking? Authorizing Provider  acetaminophen (TYLENOL) 650 MG CR tablet Take 650 mg by mouth 2 (two) times daily as needed for pain.   Yes [provider]  allopurinol (ZYLOPRIM) 300 MG tablet Take 0.5 tablets (150 mg total) by mouth daily. 03/02/20  Yes Plotnikov, Evie Lacks, MD  amLODipine (NORVASC) 5 MG tablet Take 1 tablet (5 mg total) by mouth daily. 03/01/20  Yes Plotnikov,  Evie Lacks, MD  aspirin 81 MG EC tablet Take 81 mg by mouth daily.     Yes [provider]  atropine 1 % ophthalmic solution Place 1 drop into the left eye daily as needed (Dry/irritated eyes).  10/21/19 10/20/20 Yes [provider]  CALCIUM PO Take 1 tablet by mouth daily.    Yes [provider]  carvedilol (COREG) 25 MG tablet Take 1 tablet (25 mg total) by mouth 2 (two) times daily with a meal. 03/01/20  Yes Plotnikov, Evie Lacks, MD  Cholecalciferol (VITAMIN D3) 2000 UNITS capsule Take 2,000 Units by mouth daily.     Yes [provider]  cyanocobalamin 2000 MCG tablet Take 2,000 mcg by mouth daily.   Yes [provider]  diclofenac sodium (VOLTAREN) 1 % GEL Apply 2 g topically 2 (two) times daily. 07/25/17  Yes Plotnikov, Evie Lacks, MD  diphenhydramine-acetaminophen (TYLENOL PM) 25-500 MG TABS 1 tablet at bedtime as needed (Sleep and Pain).    Yes [provider]  dorzolamide-timolol (COSOPT) 22.3-6.8 MG/ML ophthalmic solution Place 1 drop into both eyes 2 (two) times daily.    Yes [provider]  gabapentin (NEURONTIN) 100 MG capsule Take 1 capsule (100 mg total) by mouth 3 (three) times daily. 03/01/20  Yes Plotnikov, Evie Lacks, MD  latanoprost (XALATAN) 0.005 % ophthalmic solution Place 1 drop into both eyes at bedtime.  04/26/16  Yes [provider]  losartan (COZAAR) 25 MG tablet Take 50 mg by mouth daily. 05/09/20  Yes [provider]  metFORMIN (GLUCOPHAGE) 500 MG tablet Take 1 tablet (500 mg total) by mouth daily with breakfast. 03/01/20  Yes Plotnikov, Evie Lacks, MD  NON FORMULARY Place 10 drops under the tongue daily as needed (Pain). CBD oil    Yes [provider]  pantoprazole (PROTONIX) 40 MG tablet Take 1 tablet (40 mg total) by mouth daily. 03/01/20  Yes Plotnikov, Evie Lacks, MD  sertraline (ZOLOFT) 100 MG tablet Take 1 tablet (100 mg total) by mouth daily. 03/01/20  Yes Plotnikov, Evie Lacks, MD   spironolactone (ALDACTONE) 25 MG tablet Take 0.5 tablets (12.5 mg total) by mouth daily. 03/02/20  Yes Plotnikov, Evie Lacks, MD  torsemide (DEMADEX) 20 MG tablet TAKE 1/2 TABLET (10MG ) BY MOUTH DAILY AS NEEDED FOR EDEMA (SWELLING) Patient taking differently: Take 10 mg by mouth daily as needed (Edema (swelling)).  02/06/18  Yes Plotnikov, Evie Lacks, MD    Allergies    Verapamil, Aspirin, Atenolol, Codeine, Codeine sulfate, Hydrochlorothiazide, Hydrocodone, Ibuprofen, Lisinopril, Onion, Valsartan, and Adhesive  [tape]  Review of Systems   Review of Systems  Constitutional: Negative for chills and fever.  HENT: Negative for congestion.   Eyes: Negative for visual disturbance.  Respiratory: Negative for cough, choking and shortness of breath.   Cardiovascular: Negative for chest pain.  Gastrointestinal: Negative for abdominal pain, diarrhea, nausea and vomiting.  Musculoskeletal: Negative for back pain and neck pain.       Pain in right hip and elbow.   Skin: Negative for color change and rash.  Neurological: Negative for weakness, light-headedness, numbness and headaches.  Psychiatric/Behavioral: Negative for confusion.  All other systems reviewed and are negative.   Physical Exam Updated Vital Signs BP (!) 161/63 (BP Location: Right Arm)   Pulse 45   Temp 98.4 F (36.9 C) (Oral)   Resp 22   Ht 5\' 4"  (1.626 m)   Wt 64.9 kg   SpO2 100%   BMI 24.55 kg/m   Physical Exam Vitals and nursing note reviewed.  Constitutional:      General: She is not in acute distress.    Appearance: She is well-developed. She is not diaphoretic.  HENT:     Head: Normocephalic and atraumatic.     Mouth/Throat:     Mouth: Mucous membranes are moist.  Eyes:     General: No scleral icterus.       Right eye: No discharge.        Left eye: No discharge.  Conjunctiva/sclera: Conjunctivae normal.  Cardiovascular:     Rate and Rhythm: Regular rhythm. Bradycardia present.     Pulses: Normal  pulses.     Heart sounds: Normal heart sounds.     Comments: 2+ right dp pulse.  Pulmonary:     Effort: Pulmonary effort is normal. No respiratory distress.     Breath sounds: Normal breath sounds. No stridor.  Abdominal:     General: Abdomen is flat. There is no distension.     Tenderness: There is no abdominal tenderness.  Musculoskeletal:     Cervical back: Normal range of motion.     Comments: Patient has pain with any movement of the right leg felt mostly in the right hip.  There is mild pain with ROM of the left elbow. No pain with ROM of neck.    Skin:    General: Skin is warm and dry.  Neurological:     General: No focal deficit present.     Mental Status: She is alert.     Cranial Nerves: No cranial nerve deficit.     Sensory: No sensory deficit.     Motor: No abnormal muscle tone.  Psychiatric:        Mood and Affect: Mood normal.        Behavior: Behavior normal.     ED Results / Procedures / Treatments   Labs (all labs ordered are listed, but only abnormal results are displayed) Labs Reviewed  COMPREHENSIVE METABOLIC PANEL - Abnormal; Notable for the following components:      Result Value   Glucose, Bld 158 (*)    BUN 30 (*)    Creatinine, Ser 1.63 (*)    Total Protein 6.4 (*)    Albumin 3.4 (*)    GFR calc non Af Amer 28 (*)    GFR calc Af Amer 33 (*)    All other components within normal limits  CBC WITH DIFFERENTIAL/PLATELET - Abnormal; Notable for the following components:   RBC 3.40 (*)    Hemoglobin 9.7 (*)    HCT 30.8 (*)    RDW 16.7 (*)    Platelets 98 (*)    nRBC 0.5 (*)    All other components within normal limits  SARS CORONAVIRUS 2 BY RT PCR (HOSPITAL ORDER, [facility])    EKG None  Radiology DG Chest 1 View  Result Date: 07/20/2020 CLINICAL DATA:  Recent fall with chest pain, initial encounter EXAM: CHEST  1 VIEW COMPARISON:  08/13/2017 FINDINGS: Cardiac shadow is enlarged accentuated by the frontal  technique. The lungs are well aerated without focal infiltrate or sizable effusion. No acute bony abnormality is noted. IMPRESSION: No acute abnormality seen. Electronically Signed   By: Inez Catalina M.D.   On: 07/20/2020 21:51   DG Elbow Complete Right  Result Date: 07/20/2020 CLINICAL DATA:  Recent fall with elbow pain, initial encounter EXAM: RIGHT ELBOW - COMPLETE 3+ VIEW COMPARISON:  None. FINDINGS: No acute fracture or dislocation is noted. Mild olecranon spurring is seen. No soft tissue abnormality is noted. IMPRESSION: No acute abnormality noted. Electronically Signed   By: Inez Catalina M.D.   On: 07/20/2020 21:50   DG Hip Unilat With Pelvis 2-3 Views Right  Result Date: 07/20/2020 CLINICAL DATA:  86 year old female with fall. EXAM: DG HIP (WITH OR WITHOUT PELVIS) 2-3V RIGHT COMPARISON:  None. FINDINGS: Evaluation is limited due to body habitus and osteopenia. There is a displaced fracture of the lesser trochanter of  the right femur. There is a linear lucency through the base of the right femoral neck most consistent with a nondisplaced basicervical fracture. Further evaluation with CT is recommended. There is no dislocation. Partially visualized lower lumbar fusion hardware. Multiple surgical wires noted over the pelvis. The soft tissues are unremarkable. IMPRESSION: Nondisplaced basicervical fracture of the right femoral neck with displaced fracture of the lesser trochanter. Further evaluation with CT recommended. Electronically Signed   By: Anner Crete M.D.   On: 07/20/2020 21:50    Procedures Procedures (including critical care time)  Medications Ordered in ED Medications  fentaNYL (SUBLIMAZE) injection 50 mcg (has no administration in time range)  fentaNYL (SUBLIMAZE) injection 50 mcg (50 mcg Intravenous Given 07/20/20 2200)    ED Course  I have reviewed the triage vital signs and the nursing notes.  Pertinent labs & imaging results that were available during my care of the  patient were reviewed by me and considered in my medical decision making (see chart for details).  Clinical Course as of Jul 21 16  Wed Jul 20, 2020  2356 I spoke with Hopsitalist who will see patient for admission.    [EH]    Clinical Course User Index [EH] Ollen Gross   MDM Rules/Calculators/A&P                         Patient is a 86 year old woman who presents today for evaluation of a mechanical, nonsyncopal fall landing on her right hip.  She has mild pain in her right elbow in addition.  She does not take any blood thinning medications, did not strike her head and denies any pain in her head or neck.  Hip x-rays were obtained showing concern for hip fracture.    Right elbow x-rays did not show evidence of fracture.  Chest x-ray was obtained given hip fracture for preoperative clearance without acute abnormality.  Here in the ER she is bradycardic into the 40s.  Chart review shows that this appears to be her baseline on multiple prior visits.  CBC shows mild anemia with a hemoglobin of 9.7, this appears to be consistent with her baseline.  Her creatinine is slightly elevated at 1.63, again consistent with her baseline.  Her pain was treated in the emergency room with fentanyl.  I spoke with Dr. Percell Miller of orthopedics who request patient be made n.p.o. at midnight.  I spoke with Dr.Krishnan of hospitalist who will see patient for admission.   Note: Portions of this report may have been transcribed using voice recognition software. Every effort was made to ensure accuracy; however, inadvertent computerized transcription errors may be present  Final Clinical Impression(s) / ED Diagnoses Final diagnoses:  Closed fracture of right hip, initial encounter [facility])  Fall, initial encounter  Sinus bradycardia    Rx / DC Orders ED Discharge Orders    None       Ollen Gross 07/21/20 0018    Charlesetta Shanks, MD 07/27/20 [phone]

## 2020-07-21 ENCOUNTER — Inpatient Hospital Stay (HOSPITAL_COMMUNITY): Payer: Medicare PPO

## 2020-07-21 ENCOUNTER — Telehealth: Payer: Self-pay | Admitting: Internal Medicine

## 2020-07-21 ENCOUNTER — Inpatient Hospital Stay (HOSPITAL_COMMUNITY): Payer: Medicare PPO | Admitting: Certified Registered Nurse Anesthetist

## 2020-07-21 ENCOUNTER — Encounter (HOSPITAL_COMMUNITY): Payer: Self-pay | Admitting: Internal Medicine

## 2020-07-21 ENCOUNTER — Encounter (HOSPITAL_COMMUNITY)
Admission: EM | Disposition: A | Discharge status: Skilled Nursing Facility | Payer: Self-pay | Source: Home / Self Care | Attending: Internal Medicine

## 2020-07-21 DIAGNOSIS — Z9049 Acquired absence of other specified parts of digestive tract: Secondary | ICD-10-CM | POA: Diagnosis not present

## 2020-07-21 DIAGNOSIS — M545 Low back pain: Secondary | ICD-10-CM | POA: Diagnosis present

## 2020-07-21 DIAGNOSIS — Z947 Corneal transplant status: Secondary | ICD-10-CM | POA: Diagnosis not present

## 2020-07-21 DIAGNOSIS — Z8673 Personal history of transient ischemic attack (TIA), and cerebral infarction without residual deficits: Secondary | ICD-10-CM | POA: Diagnosis not present

## 2020-07-21 DIAGNOSIS — Z79899 Other long term (current) drug therapy: Secondary | ICD-10-CM | POA: Diagnosis not present

## 2020-07-21 DIAGNOSIS — S72009A Fracture of unspecified part of neck of unspecified femur, initial encounter for closed fracture: Secondary | ICD-10-CM | POA: Diagnosis present

## 2020-07-21 DIAGNOSIS — W19XXXA Unspecified fall, initial encounter: Secondary | ICD-10-CM

## 2020-07-21 DIAGNOSIS — I10 Essential (primary) hypertension: Secondary | ICD-10-CM

## 2020-07-21 DIAGNOSIS — M109 Gout, unspecified: Secondary | ICD-10-CM | POA: Diagnosis present

## 2020-07-21 DIAGNOSIS — E785 Hyperlipidemia, unspecified: Secondary | ICD-10-CM | POA: Diagnosis present

## 2020-07-21 DIAGNOSIS — D696 Thrombocytopenia, unspecified: Secondary | ICD-10-CM | POA: Diagnosis present

## 2020-07-21 DIAGNOSIS — S72141A Displaced intertrochanteric fracture of right femur, initial encounter for closed fracture: Secondary | ICD-10-CM | POA: Diagnosis present

## 2020-07-21 DIAGNOSIS — Z96652 Presence of left artificial knee joint: Secondary | ICD-10-CM | POA: Diagnosis present

## 2020-07-21 DIAGNOSIS — Z8249 Family history of ischemic heart disease and other diseases of the circulatory system: Secondary | ICD-10-CM | POA: Diagnosis not present

## 2020-07-21 DIAGNOSIS — S72001A Fracture of unspecified part of neck of right femur, initial encounter for closed fracture: Secondary | ICD-10-CM | POA: Diagnosis not present

## 2020-07-21 DIAGNOSIS — Z86010 Personal history of colonic polyps: Secondary | ICD-10-CM | POA: Diagnosis not present

## 2020-07-21 DIAGNOSIS — I129 Hypertensive chronic kidney disease with stage 1 through stage 4 chronic kidney disease, or unspecified chronic kidney disease: Secondary | ICD-10-CM | POA: Diagnosis present

## 2020-07-21 DIAGNOSIS — H401134 Primary open-angle glaucoma, bilateral, indeterminate stage: Secondary | ICD-10-CM | POA: Diagnosis present

## 2020-07-21 DIAGNOSIS — N1832 Chronic kidney disease, stage 3b: Secondary | ICD-10-CM

## 2020-07-21 DIAGNOSIS — E1122 Type 2 diabetes mellitus with diabetic chronic kidney disease: Secondary | ICD-10-CM | POA: Diagnosis present

## 2020-07-21 DIAGNOSIS — S72001S Fracture of unspecified part of neck of right femur, sequela: Secondary | ICD-10-CM | POA: Diagnosis not present

## 2020-07-21 DIAGNOSIS — Z20822 Contact with and (suspected) exposure to covid-19: Secondary | ICD-10-CM | POA: Diagnosis present

## 2020-07-21 DIAGNOSIS — E1121 Type 2 diabetes mellitus with diabetic nephropathy: Secondary | ICD-10-CM | POA: Diagnosis not present

## 2020-07-21 DIAGNOSIS — M810 Age-related osteoporosis without current pathological fracture: Secondary | ICD-10-CM | POA: Diagnosis present

## 2020-07-21 DIAGNOSIS — Z7982 Long term (current) use of aspirin: Secondary | ICD-10-CM | POA: Diagnosis not present

## 2020-07-21 DIAGNOSIS — I6350 Cerebral infarction due to unspecified occlusion or stenosis of unspecified cerebral artery: Secondary | ICD-10-CM | POA: Diagnosis not present

## 2020-07-21 DIAGNOSIS — D62 Acute posthemorrhagic anemia: Secondary | ICD-10-CM | POA: Diagnosis not present

## 2020-07-21 DIAGNOSIS — R001 Bradycardia, unspecified: Secondary | ICD-10-CM | POA: Diagnosis present

## 2020-07-21 DIAGNOSIS — D638 Anemia in other chronic diseases classified elsewhere: Secondary | ICD-10-CM | POA: Diagnosis present

## 2020-07-21 DIAGNOSIS — K219 Gastro-esophageal reflux disease without esophagitis: Secondary | ICD-10-CM | POA: Diagnosis present

## 2020-07-21 DIAGNOSIS — W010XXA Fall on same level from slipping, tripping and stumbling without subsequent striking against object, initial encounter: Secondary | ICD-10-CM | POA: Diagnosis present

## 2020-07-21 DIAGNOSIS — Z833 Family history of diabetes mellitus: Secondary | ICD-10-CM | POA: Diagnosis not present

## 2020-07-21 HISTORY — PX: INTRAMEDULLARY (IM) NAIL INTERTROCHANTERIC: SHX5875

## 2020-07-21 LAB — BASIC METABOLIC PANEL
Anion gap: 9 (ref 5–15)
BUN: 27 mg/dL — ABNORMAL HIGH (ref 8–23)
CO2: 23 mmol/L (ref 22–32)
Calcium: 9.2 mg/dL (ref 8.9–10.3)
Chloride: 109 mmol/L (ref 98–111)
Creatinine, Ser: 1.55 mg/dL — ABNORMAL HIGH (ref 0.44–1.00)
GFR calc Af Amer: 35 mL/min — ABNORMAL LOW (ref >60)
GFR calc non Af Amer: 30 mL/min — ABNORMAL LOW (ref >60)
Glucose, Bld: 160 mg/dL — ABNORMAL HIGH (ref 70–99)
Potassium: 4.4 mmol/L (ref 3.5–5.1)
Sodium: 141 mmol/L (ref 135–145)

## 2020-07-21 LAB — GLUCOSE, CAPILLARY
Glucose-Capillary: 167 mg/dL — ABNORMAL HIGH (ref 70–99)
Glucose-Capillary: 138 mg/dL — ABNORMAL HIGH (ref 70–99)
Glucose-Capillary: 103 mg/dL — ABNORMAL HIGH (ref 70–99)
Glucose-Capillary: 110 mg/dL — ABNORMAL HIGH (ref 70–99)
Glucose-Capillary: 126 mg/dL — ABNORMAL HIGH (ref 70–99)
Glucose-Capillary: 182 mg/dL — ABNORMAL HIGH (ref 70–99)

## 2020-07-21 LAB — SARS CORONAVIRUS 2 BY RT PCR (HOSPITAL ORDER, PERFORMED IN ~~LOC~~ HOSPITAL LAB): SARS Coronavirus 2: NEGATIVE

## 2020-07-21 LAB — CBC
HCT: 29.2 % — ABNORMAL LOW (ref 36.0–46.0)
Hemoglobin: 9.4 g/dL — ABNORMAL LOW (ref 12.0–15.0)
MCH: 28.4 pg (ref 26.0–34.0)
MCHC: 32.2 g/dL (ref 30.0–36.0)
MCV: 88.2 fL (ref 80.0–100.0)
Platelets: 87 10*3/uL — ABNORMAL LOW (ref 150–400)
RBC: 3.31 MIL/uL — ABNORMAL LOW (ref 3.87–5.11)
RDW: 16.2 % — ABNORMAL HIGH (ref 11.5–15.5)
WBC: 8.3 10*3/uL (ref 4.0–10.5)
nRBC: 0.2 % (ref 0.0–0.2)

## 2020-07-21 LAB — SURGICAL PCR SCREEN
MRSA, PCR: NEGATIVE
Staphylococcus aureus: NEGATIVE

## 2020-07-21 SURGERY — FIXATION, FRACTURE, INTERTROCHANTERIC, WITH INTRAMEDULLARY ROD
Anesthesia: General | Site: Leg Upper | Laterality: Right

## 2020-07-21 MED ORDER — ONDANSETRON HCL 4 MG/2ML IJ SOLN
INTRAMUSCULAR | Status: AC
  Filled 2020-07-21: qty 2

## 2020-07-21 MED ORDER — SUGAMMADEX SODIUM 200 MG/2ML IV SOLN
INTRAVENOUS | Status: DC | PRN
  Administered 2020-07-21: 180 mg via INTRAVENOUS

## 2020-07-21 MED ORDER — LATANOPROST 0.005 % OP SOLN
1.0000 [drp] | Freq: Every day | OPHTHALMIC | Status: DC
  Administered 2020-07-21 – 2020-07-28 (×8): 1 [drp] via OPHTHALMIC
  Filled 2020-07-21: qty 2.5

## 2020-07-21 MED ORDER — POLYETHYLENE GLYCOL 3350 17 G PO PACK: 17.0000 g | PACK | Freq: Every day | ORAL | Status: DC | PRN

## 2020-07-21 MED ORDER — DEXAMETHASONE SODIUM PHOSPHATE 10 MG/ML IJ SOLN
INTRAMUSCULAR | Status: DC | PRN
  Administered 2020-07-21: 5 mg via INTRAVENOUS

## 2020-07-21 MED ORDER — GLUCERNA SHAKE PO LIQD
237.0000 mL | Freq: Two times a day (BID) | ORAL | Status: DC
  Administered 2020-07-25 – 2020-07-29 (×9): 237 mL via ORAL

## 2020-07-21 MED ORDER — DEXAMETHASONE SODIUM PHOSPHATE 10 MG/ML IJ SOLN
INTRAMUSCULAR | Status: AC
  Filled 2020-07-21: qty 1

## 2020-07-21 MED ORDER — FENTANYL CITRATE (PF) 250 MCG/5ML IJ SOLN
INTRAMUSCULAR | Status: DC | PRN
  Administered 2020-07-21 (×2): 75 ug via INTRAVENOUS

## 2020-07-21 MED ORDER — INSULIN ASPART 100 UNIT/ML ~~LOC~~ SOLN: 0.0000 [IU] | Freq: Every day | SUBCUTANEOUS | Status: DC

## 2020-07-21 MED ORDER — ASPIRIN EC 81 MG PO TBEC
81.0000 mg | DELAYED_RELEASE_TABLET | Freq: Every day | ORAL | Status: DC
  Administered 2020-07-22 – 2020-07-25 (×4): 81 mg via ORAL
  Filled 2020-07-21 (×4): qty 1

## 2020-07-21 MED ORDER — SODIUM CHLORIDE 0.9 % IV SOLN: INTRAVENOUS | Status: DC

## 2020-07-21 MED ORDER — METOCLOPRAMIDE HCL 5 MG/ML IJ SOLN: 5.0000 mg | Freq: Three times a day (TID) | INTRAMUSCULAR | Status: DC | PRN

## 2020-07-21 MED ORDER — METOCLOPRAMIDE HCL 5 MG PO TABS: 5.0000 mg | ORAL_TABLET | Freq: Three times a day (TID) | ORAL | Status: DC | PRN

## 2020-07-21 MED ORDER — PROPOFOL 10 MG/ML IV BOLUS
INTRAVENOUS | Status: DC | PRN
  Administered 2020-07-21: 100 mg via INTRAVENOUS

## 2020-07-21 MED ORDER — CHLORHEXIDINE GLUCONATE 4 % EX LIQD: 60.0000 mL | Freq: Once | CUTANEOUS | Status: DC

## 2020-07-21 MED ORDER — FENTANYL CITRATE (PF) 250 MCG/5ML IJ SOLN
INTRAMUSCULAR | Status: AC
  Filled 2020-07-21: qty 5

## 2020-07-21 MED ORDER — LIDOCAINE 2% (20 MG/ML) 5 ML SYRINGE
INTRAMUSCULAR | Status: DC | PRN
  Administered 2020-07-21: 40 mg via INTRAVENOUS

## 2020-07-21 MED ORDER — SENNA 8.6 MG PO TABS
1.0000 | ORAL_TABLET | Freq: Two times a day (BID) | ORAL | Status: DC
  Administered 2020-07-21 – 2020-07-29 (×16): 8.6 mg via ORAL
  Filled 2020-07-21 (×16): qty 1

## 2020-07-21 MED ORDER — ALLOPURINOL 300 MG PO TABS
150.0000 mg | ORAL_TABLET | Freq: Every day | ORAL | Status: DC
  Administered 2020-07-21 – 2020-07-29 (×9): 150 mg via ORAL
  Filled 2020-07-21 (×4): qty 1
  Filled 2020-07-21: qty 0.5
  Filled 2020-07-21 (×5): qty 1

## 2020-07-21 MED ORDER — EPHEDRINE SULFATE 50 MG/ML IJ SOLN
INTRAMUSCULAR | Status: DC | PRN
  Administered 2020-07-21: 10 mg via INTRAVENOUS
  Administered 2020-07-21: 15 mg via INTRAVENOUS
  Administered 2020-07-21: 5 mg via INTRAVENOUS

## 2020-07-21 MED ORDER — CEFAZOLIN SODIUM-DEXTROSE 2-4 GM/100ML-% IV SOLN
2.0000 g | INTRAVENOUS | Status: AC
  Administered 2020-07-21: 2 g via INTRAVENOUS
  Filled 2020-07-21: qty 100

## 2020-07-21 MED ORDER — CEFAZOLIN SODIUM-DEXTROSE 1-4 GM/50ML-% IV SOLN
1.0000 g | Freq: Four times a day (QID) | INTRAVENOUS | Status: AC
  Administered 2020-07-21 – 2020-07-22 (×3): 1 g via INTRAVENOUS
  Filled 2020-07-21 (×3): qty 50

## 2020-07-21 MED ORDER — SUGAMMADEX SODIUM 200 MG/2ML IV SOLN: INTRAVENOUS | Status: DC | PRN

## 2020-07-21 MED ORDER — SERTRALINE HCL 100 MG PO TABS
100.0000 mg | ORAL_TABLET | Freq: Every day | ORAL | Status: DC
  Administered 2020-07-21 – 2020-07-29 (×9): 100 mg via ORAL
  Filled 2020-07-21 (×10): qty 1

## 2020-07-21 MED ORDER — LACTATED RINGERS IV SOLN: INTRAVENOUS | Status: DC

## 2020-07-21 MED ORDER — PHENYLEPHRINE HCL-NACL 10-0.9 MG/250ML-% IV SOLN
INTRAVENOUS | Status: DC | PRN
  Administered 2020-07-21: 25 ug/min via INTRAVENOUS

## 2020-07-21 MED ORDER — LIDOCAINE 2% (20 MG/ML) 5 ML SYRINGE
INTRAMUSCULAR | Status: AC
  Filled 2020-07-21: qty 5

## 2020-07-21 MED ORDER — HYDRALAZINE HCL 20 MG/ML IJ SOLN: 5.0000 mg | Freq: Four times a day (QID) | INTRAMUSCULAR | Status: DC | PRN

## 2020-07-21 MED ORDER — ONDANSETRON HCL 4 MG/2ML IJ SOLN: 4.0000 mg | Freq: Four times a day (QID) | INTRAMUSCULAR | Status: DC | PRN

## 2020-07-21 MED ORDER — CHLORHEXIDINE GLUCONATE 0.12 % MT SOLN
15.0000 mL | Freq: Once | OROMUCOSAL | Status: AC
  Administered 2020-07-21: 15 mL via OROMUCOSAL

## 2020-07-21 MED ORDER — PANTOPRAZOLE SODIUM 40 MG PO TBEC
40.0000 mg | DELAYED_RELEASE_TABLET | Freq: Every day | ORAL | Status: DC
  Administered 2020-07-21 – 2020-07-29 (×9): 40 mg via ORAL
  Filled 2020-07-21 (×9): qty 1

## 2020-07-21 MED ORDER — DOCUSATE SODIUM 100 MG PO CAPS
100.0000 mg | ORAL_CAPSULE | Freq: Two times a day (BID) | ORAL | Status: DC
  Administered 2020-07-21 – 2020-07-29 (×16): 100 mg via ORAL
  Filled 2020-07-21 (×16): qty 1

## 2020-07-21 MED ORDER — POVIDONE-IODINE 10 % EX SWAB: 2.0000 "application " | Freq: Once | CUTANEOUS | Status: DC

## 2020-07-21 MED ORDER — MORPHINE SULFATE (PF) 2 MG/ML IV SOLN
0.5000 mg | INTRAVENOUS | Status: DC | PRN
  Administered 2020-07-21: 0.5 mg via INTRAVENOUS
  Filled 2020-07-21: qty 1

## 2020-07-21 MED ORDER — PROPOFOL 10 MG/ML IV BOLUS
INTRAVENOUS | Status: AC
  Filled 2020-07-21: qty 20

## 2020-07-21 MED ORDER — PHENOL 1.4 % MT LIQD
1.0000 | OROMUCOSAL | Status: DC | PRN
  Administered 2020-07-21: 1 via OROMUCOSAL
  Filled 2020-07-21: qty 177

## 2020-07-21 MED ORDER — OXYCODONE HCL 5 MG PO TABS
5.0000 mg | ORAL_TABLET | ORAL | Status: DC | PRN
  Administered 2020-07-21: 10 mg via ORAL
  Administered 2020-07-24 – 2020-07-29 (×5): 5 mg via ORAL
  Filled 2020-07-21: qty 1
  Filled 2020-07-21: qty 2
  Filled 2020-07-21 (×4): qty 1

## 2020-07-21 MED ORDER — 0.9 % SODIUM CHLORIDE (POUR BTL) OPTIME
TOPICAL | Status: DC | PRN
  Administered 2020-07-21: 1000 mL

## 2020-07-21 MED ORDER — GABAPENTIN 100 MG PO CAPS
100.0000 mg | ORAL_CAPSULE | Freq: Three times a day (TID) | ORAL | Status: DC
  Administered 2020-07-21 – 2020-07-29 (×25): 100 mg via ORAL
  Filled 2020-07-21 (×25): qty 1

## 2020-07-21 MED ORDER — CHLORHEXIDINE GLUCONATE 0.12 % MT SOLN
OROMUCOSAL | Status: AC
  Filled 2020-07-21: qty 15

## 2020-07-21 MED ORDER — INSULIN ASPART 100 UNIT/ML ~~LOC~~ SOLN
0.0000 [IU] | Freq: Three times a day (TID) | SUBCUTANEOUS | Status: DC
  Administered 2020-07-22 – 2020-07-28 (×11): 1 [IU] via SUBCUTANEOUS

## 2020-07-21 MED ORDER — ADULT MULTIVITAMIN W/MINERALS CH
1.0000 | ORAL_TABLET | Freq: Every day | ORAL | Status: DC
  Administered 2020-07-21 – 2020-07-29 (×9): 1 via ORAL
  Filled 2020-07-21 (×9): qty 1

## 2020-07-21 MED ORDER — ONDANSETRON HCL 4 MG PO TABS: 4.0000 mg | ORAL_TABLET | Freq: Four times a day (QID) | ORAL | Status: DC | PRN

## 2020-07-21 MED ORDER — ONDANSETRON HCL 4 MG/2ML IJ SOLN
INTRAMUSCULAR | Status: DC | PRN
  Administered 2020-07-21: 4 mg via INTRAVENOUS

## 2020-07-21 MED ORDER — ORAL CARE MOUTH RINSE: 15.0000 mL | Freq: Once | OROMUCOSAL | Status: AC

## 2020-07-21 MED ORDER — ROCURONIUM BROMIDE 10 MG/ML (PF) SYRINGE
PREFILLED_SYRINGE | INTRAVENOUS | Status: DC | PRN
  Administered 2020-07-21: 50 mg via INTRAVENOUS

## 2020-07-21 MED ORDER — PHENYLEPHRINE 40 MCG/ML (10ML) SYRINGE FOR IV PUSH (FOR BLOOD PRESSURE SUPPORT)
PREFILLED_SYRINGE | INTRAVENOUS | Status: DC | PRN
  Administered 2020-07-21: 40 ug via INTRAVENOUS
  Administered 2020-07-21: 80 ug via INTRAVENOUS
  Administered 2020-07-21 (×2): 40 ug via INTRAVENOUS

## 2020-07-21 SURGICAL SUPPLY — 48 items
BNDG COHESIVE 4X5 TAN STRL (GAUZE/BANDAGES/DRESSINGS) ×2 IMPLANT
BNDG COHESIVE 6X5 TAN STRL LF (GAUZE/BANDAGES/DRESSINGS) IMPLANT
BNDG GAUZE ELAST 4 BULKY (GAUZE/BANDAGES/DRESSINGS) ×3 IMPLANT
COVER PERINEAL POST (MISCELLANEOUS) ×2 IMPLANT
COVER SURGICAL LIGHT HANDLE (MISCELLANEOUS) ×2 IMPLANT
COVER WAND RF STERILE (DRAPES) ×2 IMPLANT
DRAPE C-ARMOR (DRAPES) ×2 IMPLANT
DRAPE STERI IOBAN 125X83 (DRAPES) ×2 IMPLANT
DRAPE SURG ISO 125X83 STRL (DRAPES) ×1 IMPLANT
DRSG MEPILEX BORDER 4X4 (GAUZE/BANDAGES/DRESSINGS) ×3 IMPLANT
DRSG MEPILEX BORDER 4X8 (GAUZE/BANDAGES/DRESSINGS) ×2 IMPLANT
DRSG PAD ABDOMINAL 8X10 ST (GAUZE/BANDAGES/DRESSINGS) ×4 IMPLANT
DURAPREP 26ML APPLICATOR (WOUND CARE) ×2 IMPLANT
ELECT REM PT RETURN 9FT ADLT (ELECTROSURGICAL) ×2
ELECTRODE REM PT RTRN 9FT ADLT (ELECTROSURGICAL) ×1 IMPLANT
GAUZE XEROFORM 5X9 LF (GAUZE/BANDAGES/DRESSINGS) ×1 IMPLANT
GLOVE BIO SURGEON STRL SZ7.5 (GLOVE) ×1 IMPLANT
GLOVE BIOGEL PI IND STRL 7.0 (GLOVE) ×1 IMPLANT
GLOVE BIOGEL PI IND STRL 8 (GLOVE) IMPLANT
GLOVE BIOGEL PI INDICATOR 7.0 (GLOVE) ×1
GLOVE BIOGEL PI INDICATOR 8 (GLOVE) ×1
GLOVE ECLIPSE 7.0 STRL STRAW (GLOVE) ×2 IMPLANT
GLOVE SKINSENSE NS SZ7.5 (GLOVE) ×2
GLOVE SKINSENSE STRL SZ7.5 (GLOVE) ×2 IMPLANT
GOWN STRL REIN XL XLG (GOWN DISPOSABLE) ×2 IMPLANT
GUIDEROD T2 3X1000 (ROD) ×1 IMPLANT
K-WIRE  3.2X450M STR (WIRE) ×2
K-WIRE 3.2X450M STR (WIRE) ×1
KIT BASIN OR (CUSTOM PROCEDURE TRAY) ×2 IMPLANT
KIT TURNOVER KIT B (KITS) ×2 IMPLANT
KWIRE 3.2X450M STR (WIRE) IMPLANT
MANIFOLD NEPTUNE II (INSTRUMENTS) ×2 IMPLANT
NAIL GAMMA LG R 5TI 10X360X125 (Nail) ×1 IMPLANT
NS IRRIG 1000ML POUR BTL (IV SOLUTION) ×2 IMPLANT
PACK GENERAL/GYN (CUSTOM PROCEDURE TRAY) ×2 IMPLANT
PAD ARMBOARD 7.5X6 YLW CONV (MISCELLANEOUS) ×4 IMPLANT
PAD CAST 4YDX4 CTTN HI CHSV (CAST SUPPLIES) ×2 IMPLANT
PADDING CAST COTTON 4X4 STRL (CAST SUPPLIES) ×4
SCREW LAG GAMMA 3 TI 10.5X90MM (Screw) ×1 IMPLANT
STAPLER VISISTAT 35W (STAPLE) ×2 IMPLANT
SUT MON AB 2-0 CT1 36 (SUTURE) ×1 IMPLANT
SUT VIC AB 0 CT1 27 (SUTURE) ×4
SUT VIC AB 0 CT1 27XBRD ANBCTR (SUTURE) ×1 IMPLANT
SUT VIC AB 2-0 CT1 27 (SUTURE) ×2
SUT VIC AB 2-0 CT1 TAPERPNT 27 (SUTURE) ×1 IMPLANT
TOWEL GREEN STERILE (TOWEL DISPOSABLE) ×2 IMPLANT
TOWEL GREEN STERILE FF (TOWEL DISPOSABLE) ×2 IMPLANT
WATER STERILE IRR 1000ML POUR (IV SOLUTION) ×2 IMPLANT

## 2020-07-21 NOTE — Anesthesia Preprocedure Evaluation (Addendum)
Anesthesia Evaluation  Patient identified by MRN, date of birth, ID band Patient awake    Reviewed: Allergy & Precautions, NPO status , Patient's Chart, lab work & pertinent test results  Airway Mallampati: I  TM Distance: >3 FB Neck ROM: Full    Dental  (+) Teeth Intact, Dental Advisory Given, Chipped,    Pulmonary neg pulmonary ROS,    breath sounds clear to auscultation       Cardiovascular hypertension,  Rhythm:Regular Rate:Normal     Neuro/Psych  Headaches, CVA    GI/Hepatic GERD  ,  Endo/Other  diabetes  Renal/GU Renal disease     Musculoskeletal  (+) Arthritis ,   Abdominal Normal abdominal exam  (+)   Peds  Hematology   Anesthesia Other Findings   Reproductive/Obstetrics                            Anesthesia Physical Anesthesia Plan  ASA: III  Anesthesia Plan: General   Post-op Pain Management:    Induction: Intravenous  PONV Risk Score and Plan: 4 or greater and Dexamethasone, Ondansetron and Treatment may vary due to age or medical condition  Airway Management Planned: Oral ETT  Additional Equipment: None  Intra-op Plan:   Post-operative Plan: Extubation in OR  Informed Consent: I have reviewed the patients History and Physical, chart, labs and discussed the procedure including the risks, benefits and alternatives for the proposed anesthesia with the patient or authorized representative who has indicated his/her understanding and acceptance.     Dental advisory given  Plan Discussed with: CRNA  Anesthesia Plan Comments:         Anesthesia Quick Evaluation

## 2020-07-21 NOTE — H&P (View-Only) (Signed)
Reason for Consult:Right hip fx Referring Physician: Bennett [name] [name] is an 86 y.o. female.  HPI: Breanna [name] was at home when she turned a certain way and fell. She can't really articulate why she fell but knew she shouldn't have done what she did as it causes problems for her. She had immediate right hip pain and could not get up. She was brought to the ED where x-rays showed a right hip fx and orthopedic surgery was consulted. She c/o localized pain to the hip. She lives at home alone and doesn't use any assistive devices in the home but uses a cane or rollator if out.  Past Medical History:  Diagnosis Date  . Adrenal adenoma 2006  . Boils 2009  . Cholelithiasis    asympt. w/normal HIDA 03/2010  . CVA (cerebral infarction) 2010   Cerebellar  . Diverticulitis   . Esophageal spasm 2011  . GERD (gastroesophageal reflux disease)   . Gout   . History of colon polyps   . HTN (hypertension)   . Hydronephrosis    LEFT/ Surgical intervention  . Hyperlipidemia   . LBP (low back pain)   . Osteoarthritis   . Pulmonary HTN ([place])   . Stress   . Type II or unspecified type diabetes mellitus without mention of complication, not stated as uncontrolled     Past Surgical History:  Procedure Laterality Date  . ABDOMINAL HYSTERECTOMY    . APPENDECTOMY     2020  . BACK SURGERY    . BREAST BIOPSY     RIGHT  . CATARACT EXTRACTION, BILATERAL    . COLECTOMY  2006   Sigmoid  . FOOT SURGERY     BILATERAL  . HAMMER TOE SURGERY    . ROTATOR CUFF REPAIR  2008   RIGHT  . TOTAL KNEE ARTHROPLASTY  2003   LEFT  . VARICOSE VEIN SURGERY     vein stripping/lower extremities    Family History  Problem Relation Age of Onset  . Prostate cancer Father   . Hypertension Father   . Cancer Father        prostate  . Heart disease Mother   . Diabetes Mother   . Diabetes Other        1st degree relative  . Heart disease Other   . Hypertension Other   . Prostate cancer Maternal Uncle   . Cancer  Maternal Uncle        prostate  . Breast cancer Daughter   . Cancer Daughter        breast  . Colon cancer Neg Hx     Social History:  reports that she has never smoked. She has never used smokeless tobacco. She reports that she does not drink alcohol and does not use drugs.  Allergies:  Allergies  Allergen Reactions  . Verapamil Shortness Of Breath    REACTION: SOB  . Aspirin Itching    But tolerates low dose  . Atenolol     REACTION: fatigue  . Codeine Itching  . Codeine Sulfate   . Hydrochlorothiazide     REACTION: gout  . Hydrocodone       side effects - hallucinations  . Ibuprofen     Upset stomach w/high doses  . Lisinopril     REACTION: cough  . Onion Other (See Comments)    Dry mouth/ gets sores  . Valsartan Itching  . Adhesive  [Tape] Rash    Medications: I have reviewed the  patient's current medications.  Results for orders placed or performed during the hospital encounter of 07/20/20 (from the past 48 hour(s))  Comprehensive metabolic panel     Status: Abnormal   Collection Time: 07/20/20  9:01 PM  Result Value Ref Range   Sodium 140 135 - 145 mmol/L   Potassium 4.6 3.5 - 5.1 mmol/L   Chloride 108 98 - 111 mmol/L   CO2 22 22 - 32 mmol/L   Glucose, Bld 158 (H) 70 - 99 mg/dL    Comment: Glucose reference range applies only to samples taken after fasting for at least 8 hours.   BUN 30 (H) 8 - 23 mg/dL   Creatinine, Ser 1.63 (H) 0.44 - 1.00 mg/dL   Calcium 9.2 8.9 - 10.3 mg/dL   Total Protein 6.4 (L) 6.5 - 8.1 g/dL   Albumin 3.4 (L) 3.5 - 5.0 g/dL   AST 23 15 - 41 U/L   ALT 12 0 - 44 U/L   Alkaline Phosphatase 81 38 - 126 U/L   Total Bilirubin 0.5 0.3 - 1.2 mg/dL   GFR calc non Af Amer 28 (L) >60 mL/min   GFR calc Af Amer 33 (L) >60 mL/min   Anion gap 10 5 - 15    Comment: Performed at [facility] [address], [place], [place] [address]  CBC with Differential     Status: Abnormal   Collection Time: 07/20/20  9:01 PM  Result Value Ref  Range   WBC 6.0 4.0 - 10.5 K/uL   RBC 3.40 (L) 3.87 - 5.11 MIL/uL   Hemoglobin 9.7 (L) 12.0 - 15.0 g/dL   HCT 30.8 (L) 36 - 46 %   MCV 90.6 80.0 - 100.0 fL   MCH 28.5 26.0 - 34.0 pg   MCHC 31.5 30.0 - 36.0 g/dL   RDW 16.7 (H) 11.5 - 15.5 %   Platelets 98 (L) 150 - 400 K/uL    Comment: REPEATED TO VERIFY PLATELET COUNT CONFIRMED BY SMEAR    nRBC 0.5 (H) 0.0 - 0.2 %   Neutrophils Relative % 74 %   Neutro Abs 4.4 1.7 - 7.7 K/uL   Lymphocytes Relative 15 %   Lymphs Abs 0.9 0.7 - 4.0 K/uL   Monocytes Relative 6 %   Monocytes Absolute 0.4 0 - 1 K/uL   Eosinophils Relative 4 %   Eosinophils Absolute 0.3 0 - 0 K/uL   Basophils Relative 0 %   Basophils Absolute 0.0 0 - 0 K/uL   Immature Granulocytes 1 %   Abs Immature Granulocytes 0.05 0.00 - 0.07 K/uL    Comment: Performed at [facility], [place] [address], [place], [place] [address]  SARS Coronavirus 2 by RT PCR (hospital order, performed in [facility] hospital lab) Nasopharyngeal Nasopharyngeal Swab     Status: None   Collection Time: 07/20/20 10:18 PM   Specimen: Nasopharyngeal Swab  Result Value Ref Range   SARS Coronavirus 2 NEGATIVE NEGATIVE    Comment: (NOTE) SARS-CoV-2 target nucleic acids are NOT DETECTED.  The SARS-CoV-2 RNA is generally detectable in upper and lower respiratory specimens during the acute phase of infection. The lowest concentration of SARS-CoV-2 viral copies this assay can detect is 250 copies / mL. A negative result does not preclude SARS-CoV-2 infection and should not be used as the sole basis for treatment or other patient management decisions.  A negative result may occur with improper specimen collection / handling, submission of specimen other than nasopharyngeal  swab, presence of viral mutation(s) within the areas targeted by this assay, and inadequate number of viral copies (<250 copies / mL). A negative result must be combined with clinical observations, patient history, and  epidemiological information.  Fact Sheet for Patients:   StrictlyIdeas.no  Fact Sheet for Healthcare Providers: BankingDealers.co.za  This test is not yet approved or  cleared by the [place] FDA and has been authorized for detection and/or diagnosis of SARS-CoV-2 by FDA under an Emergency Use Authorization (EUA).  This EUA will remain in effect (meaning this test can be used) for the duration of the COVID-19 declaration under Section 564(b)(1) of the Act, 21 U.S.C. section 360bbb-3(b)(1), unless the authorization is terminated or revoked sooner.  Performed at [facility], [place] [address] [place], [place] [address]   CBC     Status: Abnormal   Collection Time: 07/21/20  4:46 AM  Result Value Ref Range   WBC 8.3 4.0 - 10.5 K/uL   RBC 3.31 (L) 3.87 - 5.11 MIL/uL   Hemoglobin 9.4 (L) 12.0 - 15.0 g/dL   HCT 29.2 (L) 36 - 46 %   MCV 88.2 80.0 - 100.0 fL   MCH 28.4 26.0 - 34.0 pg   MCHC 32.2 30.0 - 36.0 g/dL   RDW 16.2 (H) 11.5 - 15.5 %   Platelets 87 (L) 150 - 400 K/uL    Comment: REPEATED TO VERIFY Immature Platelet Fraction may be clinically indicated, consider ordering this additional test [id number] CONSISTENT WITH PREVIOUS RESULT    nRBC 0.2 0.0 - 0.2 %    Comment: Performed at [facility], [place] [address] [place], [place] [address]  Basic metabolic panel     Status: Abnormal   Collection Time: 07/21/20  4:46 AM  Result Value Ref Range   Sodium 141 135 - 145 mmol/L   Potassium 4.4 3.5 - 5.1 mmol/L   Chloride 109 98 - 111 mmol/L   CO2 23 22 - 32 mmol/L   Glucose, Bld 160 (H) 70 - 99 mg/dL    Comment: Glucose reference range applies only to samples taken after fasting for at least 8 hours.   BUN 27 (H) 8 - 23 mg/dL   Creatinine, Ser 1.55 (H) 0.44 - 1.00 mg/dL   Calcium 9.2 8.9 - 10.3 mg/dL   GFR calc non Af Amer 30 (L) >60 mL/min   GFR calc Af Amer 35 (L) >60 mL/min   Anion gap 9 5 - 15    Comment:  Performed at [facility] [address] [place], [place] [address]  Glucose, capillary     Status: Abnormal   Collection Time: 07/21/20  4:49 AM  Result Value Ref Range   Glucose-Capillary 167 (H) 70 - 99 mg/dL    Comment: Glucose reference range applies only to samples taken after fasting for at least 8 hours.  Surgical pcr screen     Status: None   Collection Time: 07/21/20  5:36 AM   Specimen: Nasal Mucosa; Nasal Swab  Result Value Ref Range   MRSA, PCR NEGATIVE NEGATIVE   Staphylococcus aureus NEGATIVE NEGATIVE    Comment: (NOTE) The Xpert SA Assay (FDA approved for NASAL specimens in patients 34 years of age and older), is one component of a comprehensive surveillance program. It is not intended to diagnose infection nor to guide or monitor treatment. Performed at [facility], [place] [address], [place], [place] [address]   Glucose, capillary  Status: Abnormal   Collection Time: 07/21/20  6:42 AM  Result Value Ref Range   Glucose-Capillary 138 (H) 70 - 99 mg/dL    Comment: Glucose reference range applies only to samples taken after fasting for at least 8 hours.    DG Chest 1 View  Result Date: 07/20/2020 CLINICAL DATA:  Recent fall with chest pain, initial encounter EXAM: CHEST  1 VIEW COMPARISON:  08/13/2017 FINDINGS: Cardiac shadow is enlarged accentuated by the frontal technique. The lungs are well aerated without focal infiltrate or sizable effusion. No acute bony abnormality is noted. IMPRESSION: No acute abnormality seen. Electronically Signed   By: Inez Catalina M.D.   On: 07/20/2020 21:51   DG Lumbar Spine 2-3 Views  Result Date: 07/21/2020 CLINICAL DATA:  Fall and back pain EXAM: LUMBAR SPINE - 2-3 VIEW COMPARISON:  June 04, 2016 FINDINGS: The patient is status post lumbar spine fixation from L3 through L5 with posterior spinal fixation hardware. No periprosthetic lucency or fracture is identified. Disc height loss with facet arthrosis is most notable at  L5-S1. There is diffuse osteopenia. IMPRESSION: Status post lumbar spine fixation from L3 through L5 without complication. No definite acute fracture or malalignment. Electronically Signed   By: Prudencio Pair M.D.   On: 07/21/2020 01:15   DG Elbow Complete Right  Result Date: 07/20/2020 CLINICAL DATA:  Recent fall with elbow pain, initial encounter EXAM: RIGHT ELBOW - COMPLETE 3+ VIEW COMPARISON:  None. FINDINGS: No acute fracture or dislocation is noted. Mild olecranon spurring is seen. No soft tissue abnormality is noted. IMPRESSION: No acute abnormality noted. Electronically Signed   By: Inez Catalina M.D.   On: 07/20/2020 21:50   CT Hip Right Wo Contrast  Result Date: 07/21/2020 CLINICAL DATA:  Fall fractured hip EXAM: CT OF THE RIGHT HIP WITHOUT CONTRAST TECHNIQUE: Multidetector CT imaging of the right hip was performed according to the standard protocol. Multiplanar CT image reconstructions were also generated. COMPARISON:  None. FINDINGS: Bones/Joint/Cartilage There is a comminuted mildly displaced fracture involving the right intratrochanteric region. The fracture line extends through the greater trochanter with a mildly displaced lesser trochanter. The femoral head is slightly rotated, however still well seated within the acetabulum. There is diffuse osteopenia noted. No other definite fracture is seen. Ligaments Suboptimally assessed by CT. Muscles and Tendons The muscles surrounding the hip appear to be grossly intact. The tendons appear to be intact. Soft tissues Mild soft tissue edema seen over the lateral aspect of the hip. The visualized deep pelvis is grossly unremarkable. IMPRESSION: Comminuted proximal right intratrochanteric femur fracture with mild displacement of the lesser trochanter. Electronically Signed   By: Prudencio Pair M.D.   On: 07/21/2020 00:25   DG Hip Unilat With Pelvis 2-3 Views Right  Result Date: 07/20/2020 CLINICAL DATA:  86 year old female with fall. EXAM: DG HIP (WITH  OR WITHOUT PELVIS) 2-3V RIGHT COMPARISON:  None. FINDINGS: Evaluation is limited due to body habitus and osteopenia. There is a displaced fracture of the lesser trochanter of the right femur. There is a linear lucency through the base of the right femoral neck most consistent with a nondisplaced basicervical fracture. Further evaluation with CT is recommended. There is no dislocation. Partially visualized lower lumbar fusion hardware. Multiple surgical wires noted over the pelvis. The soft tissues are unremarkable. IMPRESSION: Nondisplaced basicervical fracture of the right femoral neck with displaced fracture of the lesser trochanter. Further evaluation with CT recommended. Electronically Signed   By: Laren Everts.D.  On: 07/20/2020 21:50    Review of Systems  HENT: Negative for ear discharge, ear pain, hearing loss and tinnitus.   Eyes: Negative for photophobia and pain.  Respiratory: Negative for cough and shortness of breath.   Cardiovascular: Negative for chest pain.  Gastrointestinal: Negative for abdominal pain, nausea and vomiting.  Genitourinary: Negative for dysuria, flank pain, frequency and urgency.  Musculoskeletal: Positive for arthralgias (Right hip). Negative for back pain, myalgias and neck pain.  Neurological: Negative for dizziness and headaches.  Hematological: Does not bruise/bleed easily.  Psychiatric/Behavioral: The patient is not nervous/anxious.    Blood pressure (!) 118/48, pulse 52, temperature 98.4 F (36.9 C), temperature source Oral, resp. rate 18, height 5\' 4"  (1.626 m), weight 64.9 kg, SpO2 96 %. Physical Exam Constitutional:      General: She is not in acute distress.    Appearance: She is well-developed. She is not diaphoretic.  HENT:     Head: Normocephalic and atraumatic.  Eyes:     General: No scleral icterus.       Right eye: No discharge.        Left eye: No discharge.     Conjunctiva/sclera: Conjunctivae normal.  Cardiovascular:     Rate and  Rhythm: Normal rate and regular rhythm.  Pulmonary:     Effort: Pulmonary effort is normal. No respiratory distress.  Musculoskeletal:     Cervical back: Normal range of motion.     Comments: RLE No traumatic wounds, ecchymosis, or rash  Mild TTP hip  No knee or ankle effusion  Knee stable to varus/ valgus and anterior/posterior stress  Sens DPN, SPN, TN intact  Motor EHL, ext, flex, evers 5/5  DP 2+, PT 0, No significant edema  Skin:    General: Skin is warm and dry.  Neurological:     Mental Status: She is alert.  Psychiatric:        Behavior: Behavior normal.     Assessment/Plan: Right hip fx -- Plan IMN today by Dr. Percell Miller. Please keep NPO. Multiple medical problems including gout, essential hypertension, glaucoma, history of osteoarthritis, and diabetes mellitus type 2 -- per primary service    Lisette Abu, PA-C Orthopedic Surgery [phone] 07/21/2020, 9:55 AM

## 2020-07-21 NOTE — Progress Notes (Signed)
PROGRESS NOTE    Breanna [name]  FYB:[id number] DOB: 19-Apr-1931 DOA: 07/20/2020 PCP: Cassandria Anger, MD    Brief Narrative:  86 y.o. female with a past medical history of gout, essential hypertension, glaucoma, history of osteoarthritis, diabetes mellitus type 2 who was in her usual state of health till earlier today when she was with a friend and was watering her plants when the patient made a sudden turn to the right and then lost her balance and fell onto her right side and her arm.  Denies hitting her head against the ground.  Pain is currently 10 out of 10 in intensity.  She is experiencing some muscle spasms as well.  She denies any chest pain shortness of breath.  No nausea vomiting.  She does mention pain in the lower back as well towards the right.  In the emergency department x-ray revealed fracture of the right femoral neck.  She will be hospitalized for further management.  She is noted to be bradycardic.  She is noted to be on beta-blockers.  Assessment & Plan:   Active Problems:   Type 2 diabetes mellitus with renal manifestations, controlled (HCC)   Essential hypertension   Cerebral artery occlusion with cerebral infarction [facility])   Hip fracture (HCC)   Sinus bradycardia  1. Right hip fracture and low back pain:  -orthopedic surgery consulted -Pt now s/p surgery 7/29 -PT/OT post op per orthopedic surgery -Would continue with analgesia as needed  2. Sinus bradycardia:  -She is noted to be on carvedilol prior to admit, currently on hold -Her TSH was normal when checked earlier this month.   -continued on tele  3.  Chronic kidney disease stage IIIb:  -Creatinine seems to be close to her baseline.   -Continue to monitor urine output.  -repeat bmet in AM  4.  Essential hypertension:  -Elevated blood pressure most likely secondary to pain.   -At home she is on amlodipine, carvedilol, Cozaar.   -held Cozaar due to elevated creatinine and coreg per above for  presenting bradycardia  5. Normocytic anemia: Likely anemia of chronic disease.   -Hemoglobin noted to be close to baseline.   -No evidence for any overt blood loss.  Continue to monitor.  6. Thrombocytopenia:  -Platelet count noted to be 98,000 at time of presentation -Slightly lower today. -Repeat cbc in AM  7. Diabetes mellitus type 2, with renal complications with chronic kidney disease:  -On Metformin at home which will be held while in hospital. -Continue  SSI.  HBA1C was 6.1 on July 06, 2020.  8. History of gout: Continue allopurinol.  DVT prophylaxis: SCD's Code Status: Full Family Communication: Pt in room, family at bedside  Status is: Inpatient  Remains inpatient appropriate because:Ongoing diagnostic testing needed not appropriate for outpatient work up and Unsafe d/c plan   Dispo: The patient is from: Home              Anticipated d/c is to: Pending PT eval              Anticipated d/c date is: 2 days              Patient currently is not medically stable to d/c.       Consultants:   Orthopedic Surgery  Procedures:  Intramedullary nail intertrochantric 7/29  Antimicrobials: Anti-infectives (From admission, onward)   Start     Dose/Rate Route Frequency Ordered Stop   07/21/20 1100  ceFAZolin (ANCEF) IVPB 2g/100 mL premix  2 g 200 mL/hr over 30 Minutes Intravenous On call to O.R. 07/21/20 1004 07/21/20 1200       Subjective: Without complaints. Eager to have surgery. Pt seen prior to OR  Objective: Vitals:   07/21/20 1333 07/21/20 1340 07/21/20 1355 07/21/20 1416  BP: (!) 135/55 (!) 89/54 (!) 131/56 (!) 115/58  Pulse: 50 49 85 83  Resp: 12 19 19    Temp:   97.6 F (36.4 C) 98.2 F (36.8 C)  TempSrc:    Oral  SpO2: 100% 94% 100% 98%  Weight:      Height:        Intake/Output Summary (Last 24 hours) at 07/21/2020 1418 Last data filed at 07/21/2020 1355 Gross per 24 hour  Intake --  Output 950 ml  Net -950 ml   Filed Weights    07/20/20 2054  Weight: 64.9 kg    Examination:  General exam: Appears calm and comfortable  Respiratory system: Clear to auscultation. Respiratory effort normal. Cardiovascular system: S1 & S2 heard, Regular Gastrointestinal system: Abdomen is nondistended, soft and nontender. No organomegaly or masses felt. Normal bowel sounds heard. Central nervous system: Alert and oriented. No focal neurological deficits. Extremities: Symmetric 5 x 5 power. Skin: No rashes, lesions  Psychiatry: Judgement and insight appear normal. Mood & affect appropriate.   Data Reviewed: I have personally reviewed following labs and imaging studies  CBC: Recent Labs  Lab 07/20/20 2101 07/21/20 0446  WBC 6.0 8.3  NEUTROABS 4.4  --   HGB 9.7* 9.4*  HCT 30.8* 29.2*  MCV 90.6 88.2  PLT 98* 87*   Basic Metabolic Panel: Recent Labs  Lab 07/20/20 2101 07/21/20 0446  NA 140 141  K 4.6 4.4  CL 108 109  CO2 22 23  GLUCOSE 158* 160*  BUN 30* 27*  CREATININE 1.63* 1.55*  CALCIUM 9.2 9.2   GFR: Estimated Creatinine Clearance: 22.5 mL/min (A) (by C-G formula based on SCr of 1.55 mg/dL (H)). Liver Function Tests: Recent Labs  Lab 07/20/20 2101  AST 23  ALT 12  ALKPHOS 81  BILITOT 0.5  PROT 6.4*  ALBUMIN 3.4*   No results for input(s): LIPASE, AMYLASE in the last 168 hours. No results for input(s): AMMONIA in the last 168 hours. Coagulation Profile: No results for input(s): INR, PROTIME in the last 168 hours. Cardiac Enzymes: No results for input(s): CKTOTAL, CKMB, CKMBINDEX, TROPONINI in the last 168 hours. BNP (last 3 results) No results for input(s): PROBNP in the last 8760 hours. HbA1C: No results for input(s): HGBA1C in the last 72 hours. CBG: Recent Labs  Lab 07/21/20 0449 07/21/20 0642 07/21/20 1003 07/21/20 1331  GLUCAP 167* 138* 103* 110*   Lipid Profile: No results for input(s): CHOL, HDL, LDLCALC, TRIG, CHOLHDL, LDLDIRECT in the last 72 hours. Thyroid Function  Tests: No results for input(s): TSH, T4TOTAL, FREET4, T3FREE, THYROIDAB in the last 72 hours. Anemia Panel: No results for input(s): VITAMINB12, FOLATE, FERRITIN, TIBC, IRON, RETICCTPCT in the last 72 hours. Sepsis Labs: No results for input(s): PROCALCITON, LATICACIDVEN in the last 168 hours.  Recent Results (from the past 240 hour(s))  SARS Coronavirus 2 by RT PCR (hospital order, performed in [facility] hospital lab) Nasopharyngeal Nasopharyngeal Swab     Status: None   Collection Time: 07/20/20 10:18 PM   Specimen: Nasopharyngeal Swab  Result Value Ref Range Status   SARS Coronavirus 2 NEGATIVE NEGATIVE Final    Comment: (NOTE) SARS-CoV-2 target nucleic acids are NOT DETECTED.  The SARS-CoV-2  RNA is generally detectable in upper and lower respiratory specimens during the acute phase of infection. The lowest concentration of SARS-CoV-2 viral copies this assay can detect is 250 copies / mL. A negative result does not preclude SARS-CoV-2 infection and should not be used as the sole basis for treatment or other patient management decisions.  A negative result may occur with improper specimen collection / handling, submission of specimen other than nasopharyngeal swab, presence of viral mutation(s) within the areas targeted by this assay, and inadequate number of viral copies (<250 copies / mL). A negative result must be combined with clinical observations, patient history, and epidemiological information.  Fact Sheet for Patients:   StrictlyIdeas.no  Fact Sheet for Healthcare Providers: BankingDealers.co.za  This test is not yet approved or  cleared by the [place] FDA and has been authorized for detection and/or diagnosis of SARS-CoV-2 by FDA under an Emergency Use Authorization (EUA).  This EUA will remain in effect (meaning this test can be used) for the duration of the COVID-19 declaration under Section 564(b)(1) of the  Act, 21 U.S.C. section 360bbb-3(b)(1), unless the authorization is terminated or revoked sooner.  Performed at [facility], [place] [address], [place], [place] [address]   Surgical pcr screen     Status: None   Collection Time: 07/21/20  5:36 AM   Specimen: Nasal Mucosa; Nasal Swab  Result Value Ref Range Status   MRSA, PCR NEGATIVE NEGATIVE Final   Staphylococcus aureus NEGATIVE NEGATIVE Final    Comment: (NOTE) The Xpert SA Assay (FDA approved for NASAL specimens in patients 34 years of age and older), is one component of a comprehensive surveillance program. It is not intended to diagnose infection nor to guide or monitor treatment. Performed at [facility], [place] [address] [place], [place] [address]      Radiology Studies: DG Chest 1 View  Result Date: 07/20/2020 CLINICAL DATA:  Recent fall with chest pain, initial encounter EXAM: CHEST  1 VIEW COMPARISON:  08/13/2017 FINDINGS: Cardiac shadow is enlarged accentuated by the frontal technique. The lungs are well aerated without focal infiltrate or sizable effusion. No acute bony abnormality is noted. IMPRESSION: No acute abnormality seen. Electronically Signed   By: Inez Catalina M.D.   On: 07/20/2020 21:51   DG Lumbar Spine 2-3 Views  Result Date: 07/21/2020 CLINICAL DATA:  Fall and back pain EXAM: LUMBAR SPINE - 2-3 VIEW COMPARISON:  June 04, 2016 FINDINGS: The patient is status post lumbar spine fixation from L3 through L5 with posterior spinal fixation hardware. No periprosthetic lucency or fracture is identified. Disc height loss with facet arthrosis is most notable at L5-S1. There is diffuse osteopenia. IMPRESSION: Status post lumbar spine fixation from L3 through L5 without complication. No definite acute fracture or malalignment. Electronically Signed   By: Prudencio Pair M.D.   On: 07/21/2020 01:15   DG Elbow Complete Right  Result Date: 07/20/2020 CLINICAL DATA:  Recent fall with elbow pain, initial encounter  EXAM: RIGHT ELBOW - COMPLETE 3+ VIEW COMPARISON:  None. FINDINGS: No acute fracture or dislocation is noted. Mild olecranon spurring is seen. No soft tissue abnormality is noted. IMPRESSION: No acute abnormality noted. Electronically Signed   By: Inez Catalina M.D.   On: 07/20/2020 21:50   CT Hip Right Wo Contrast  Result Date: 07/21/2020 CLINICAL DATA:  Fall fractured hip EXAM: CT OF THE RIGHT HIP WITHOUT CONTRAST TECHNIQUE: Multidetector CT imaging of the right hip was performed according to the  standard protocol. Multiplanar CT image reconstructions were also generated. COMPARISON:  None. FINDINGS: Bones/Joint/Cartilage There is a comminuted mildly displaced fracture involving the right intratrochanteric region. The fracture line extends through the greater trochanter with a mildly displaced lesser trochanter. The femoral head is slightly rotated, however still well seated within the acetabulum. There is diffuse osteopenia noted. No other definite fracture is seen. Ligaments Suboptimally assessed by CT. Muscles and Tendons The muscles surrounding the hip appear to be grossly intact. The tendons appear to be intact. Soft tissues Mild soft tissue edema seen over the lateral aspect of the hip. The visualized deep pelvis is grossly unremarkable. IMPRESSION: Comminuted proximal right intratrochanteric femur fracture with mild displacement of the lesser trochanter. Electronically Signed   By: Prudencio Pair M.D.   On: 07/21/2020 00:25   DG C-Arm 1-60 Min  Result Date: 07/21/2020 CLINICAL DATA:  Right hip ORIF EXAM: RIGHT FEMUR 2 VIEWS; DG C-ARM 1-60 MIN COMPARISON:  X-ray 07/20/2020 FINDINGS: 3 C-arm fluoroscopic images were obtained intraoperatively and submitted for post operative interpretation. Interval placement of long IM rod with proximal lag screw traversing intertrochanteric fracture of the right femur with good anatomic alignment. 37 seconds of fluoroscopy time was utilized. Please see the performing  provider's procedural report for further detail. IMPRESSION: As above. Electronically Signed   By: Davina Poke D.O.   On: 07/21/2020 13:41   DG Hip Unilat With Pelvis 2-3 Views Right  Result Date: 07/20/2020 CLINICAL DATA:  86 year old female with fall. EXAM: DG HIP (WITH OR WITHOUT PELVIS) 2-3V RIGHT COMPARISON:  None. FINDINGS: Evaluation is limited due to body habitus and osteopenia. There is a displaced fracture of the lesser trochanter of the right femur. There is a linear lucency through the base of the right femoral neck most consistent with a nondisplaced basicervical fracture. Further evaluation with CT is recommended. There is no dislocation. Partially visualized lower lumbar fusion hardware. Multiple surgical wires noted over the pelvis. The soft tissues are unremarkable. IMPRESSION: Nondisplaced basicervical fracture of the right femoral neck with displaced fracture of the lesser trochanter. Further evaluation with CT recommended. Electronically Signed   By: Anner Crete M.D.   On: 07/20/2020 21:50   DG FEMUR, MIN 2 VIEWS RIGHT  Result Date: 07/21/2020 CLINICAL DATA:  Right hip ORIF EXAM: RIGHT FEMUR 2 VIEWS; DG C-ARM 1-60 MIN COMPARISON:  X-ray 07/20/2020 FINDINGS: 3 C-arm fluoroscopic images were obtained intraoperatively and submitted for post operative interpretation. Interval placement of long IM rod with proximal lag screw traversing intertrochanteric fracture of the right femur with good anatomic alignment. 37 seconds of fluoroscopy time was utilized. Please see the performing provider's procedural report for further detail. IMPRESSION: As above. Electronically Signed   By: Davina Poke D.O.   On: 07/21/2020 13:41    Scheduled Meds: . [MAR Hold] allopurinol  150 mg Oral Daily  . [MAR Hold] aspirin EC  81 mg Oral Daily  . chlorhexidine  60 mL Topical Once  . chlorhexidine      . [MAR Hold] feeding supplement (GLUCERNA SHAKE)  237 mL Oral BID BM  . [MAR Hold] gabapentin   100 mg Oral TID  . [MAR Hold] latanoprost  1 drop Both Eyes QHS  . [MAR Hold] multivitamin with minerals  1 tablet Oral Daily  . [MAR Hold] pantoprazole  40 mg Oral Daily  . povidone-iodine  2 application Topical Once  . [MAR Hold] senna  1 tablet Oral BID  . [MAR Hold] sertraline  100 mg Oral  Daily   Continuous Infusions: . sodium chloride Stopped (07/21/20 1009)  . lactated ringers 800 mL/hr at 07/21/20 1315     LOS: 0 days   Marylu Lund, MD [facility] Pager On Amion  If 7PM-7AM, please contact night-coverage 07/21/2020, 2:18 PM

## 2020-07-21 NOTE — Transfer of Care (Signed)
Immediate Anesthesia Transfer of Care Note  Patient: Breanna [name]  Procedure(s) Performed: INTRAMEDULLARY (IM) NAIL INTERTROCHANTRIC (Right Leg Upper)  Patient Location: PACU  Anesthesia Type:General  Level of Consciousness: drowsy and patient cooperative  Airway & Oxygen Therapy: Patient Spontanous Breathing and Patient connected to face mask oxygen  Post-op Assessment: Report given to RN and Post -op Vital signs reviewed and stable  Post vital signs: Reviewed and stable  Last Vitals:  Vitals Value Taken Time  BP 145/122   Temp    Pulse 50   Resp 14   SpO2 100%     Last Pain:  Vitals:   07/21/20 1038  TempSrc:   PainSc: 3       Patients Stated Pain Goal: 0 (27/51/70 0174)  Complications: No complications documented.

## 2020-07-21 NOTE — Anesthesia Procedure Notes (Signed)
Procedure Name: Intubation Date/Time: 07/21/2020 12:06 PM Performed by: Raenette Rover, CRNA Pre-anesthesia Checklist: Patient identified, Emergency Drugs available, Suction available and Patient being monitored Patient Re-evaluated:Patient Re-evaluated prior to induction Oxygen Delivery Method: Circle system utilized Preoxygenation: Pre-oxygenation with 100% oxygen Induction Type: IV induction Ventilation: Mask ventilation without difficulty Laryngoscope Size: Mac and 3 Grade View: Grade I Tube type: Oral Tube size: 7.0 mm Number of attempts: 1 Airway Equipment and Method: Stylet Placement Confirmation: ETT inserted through vocal cords under direct vision,  positive ETCO2 and breath sounds checked- equal and bilateral Secured at: 21 cm Tube secured with: Tape Dental Injury: Teeth and Oropharynx as per pre-operative assessment

## 2020-07-21 NOTE — Progress Notes (Signed)
ANTICOAGULATION CONSULT NOTE  Labs: Recent Labs    07/20/20 2101 07/21/20 0446  HGB 9.7* 9.4*  HCT 30.8* 29.2*  PLT 98* 87*  CREATININE 1.63* 1.55*    Assessment: Pharmacy consulted for restart of PTA direct oral anticoagulant (DOAC) on POD1 hip fracture pending AM hemoglobin being stable (>8) and not requiring blood transfusion. Hg low stable 9.4, plt down to 87 today. Patient is not on anticoagulation PTA, only on SCDs inpatient prior to procedure.  Goal of Therapy:  VTE prevention Monitor platelets by anticoagulation protocol: Yes   Plan:  Patient is not on any anticoagulation PTA. F/u CBC and further Ortho plans in AM on POD1   Elicia Lamp, PharmD, BCPS Clinical Pharmacist 07/21/2020 3:29 PM

## 2020-07-21 NOTE — Progress Notes (Signed)
Initial Nutrition Assessment  DOCUMENTATION CODES:   Not applicable  INTERVENTION:   -MVI with minerals daily -Glucerna Shake po BID, each supplement provides 220 kcal and 10 grams of protein  NUTRITION DIAGNOSIS:   Increased nutrient needs related to post-op healing as evidenced by estimated needs.  GOAL:   Patient will meet greater than or equal to 90% of their needs  MONITOR:   PO intake, Supplement acceptance, Diet advancement, Labs, Weight trends, Skin, I & O's  REASON FOR ASSESSMENT:   Consult Hip fracture protocol  ASSESSMENT:   Breanna [name] is a 86 y.o. female with a past medical history of gout, essential hypertension, glaucoma, history of osteoarthritis, diabetes mellitus type 2 who was in her usual state of health till earlier today when she was with a friend and was watering her plants when the patient made a sudden turn to the right and then lost her balance and fell onto her right side and her arm  Pt admitted with rt hip fracture and low back pain.   Reviewed I/O's: 0 ml x 24 hours  Per H&P, orthopedics will operate on pt later on today. Pt is NPO for procedure.   Attempted to speak with pt via phone, however, no answer. Unable to obtain further nutrition-related history at this time.  Per wt hx, wt has been stable over the past year.   Lab Results  Component Value Date   HGBA1C 6.1 (H) 07/06/2020   PTA DM medications are 500 mg metformin daily.   Labs reviewed: CBGS: 167-138 (inpatient orders for glycemic control are none).   Diet Order:   Diet Order            Diet NPO time specified  Diet effective now                 EDUCATION NEEDS:   No education needs have been identified at this time  Skin:  Skin Assessment: Reviewed RN Assessment  Last BM:  Unknown  Height:   Ht Readings from Last 1 Encounters:  07/20/20 5\' 4"  (1.626 m)    Weight:   Wt Readings from Last 1 Encounters:  07/20/20 64.9 kg    Ideal Body Weight:   54.5 kg  BMI:  Body mass index is 24.55 kg/m.  Estimated Nutritional Needs:   Kcal:  1600-1800  Protein:  80-95 grams  Fluid:  > 1.6 L    Loistine Chance, RD, LDN, [place] Registered Dietitian II Certified Diabetes Care and Education Specialist Please refer to [facility] for RD and/or RD on-call/weekend/after hours pager

## 2020-07-21 NOTE — Plan of Care (Signed)
  Problem: Activity: Goal: Risk for activity intolerance will decrease Outcome: Progressing   Problem: Coping: Goal: Level of anxiety will decrease Outcome: Progressing   Problem: Pain Managment: Goal: General experience of comfort will improve Outcome: Progressing   Problem: Safety: Goal: Ability to remain free from injury will improve Outcome: Progressing   Problem: Skin Integrity: Goal: Risk for impaired skin integrity will decrease Outcome: Progressing   

## 2020-07-21 NOTE — Anesthesia Postprocedure Evaluation (Signed)
Anesthesia Post Note  Patient: Sports coach  Procedure(s) Performed: INTRAMEDULLARY (IM) NAIL INTERTROCHANTRIC (Right Leg Upper)     Patient location during evaluation: PACU Anesthesia Type: General Level of consciousness: awake and alert Pain management: pain level controlled Vital Signs Assessment: post-procedure vital signs reviewed and stable Respiratory status: spontaneous breathing, nonlabored ventilation, respiratory function stable and patient connected to nasal cannula oxygen Cardiovascular status: blood pressure returned to baseline and stable Postop Assessment: no apparent nausea or vomiting Anesthetic complications: no   No complications documented.  Last Vitals:  Vitals:   07/21/20 1355 07/21/20 1416  BP: (!) 131/56 (!) 115/58  Pulse: 85 83  Resp: 19   Temp: 36.4 C 36.8 C  SpO2: 100% 98%    Last Pain:  Vitals:   07/21/20 1416  TempSrc: Oral  PainSc:                  Effie Berkshire

## 2020-07-21 NOTE — Interval H&P Note (Signed)
History and Physical Interval Note:  07/21/2020 11:55 AM  Breanna [name]  has presented today for surgery, with the diagnosis of right intertroch hip fracture.  The various methods of treatment have been discussed with the patient and family. After consideration of risks, benefits and other options for treatment, the patient has consented to  Procedure(s): INTRAMEDULLARY (IM) NAIL INTERTROCHANTRIC (Right) as a surgical intervention.  The patient's history has been reviewed, patient examined, no change in status, stable for surgery.  I have reviewed the patient's chart and labs.  Questions were answered to the patient's satisfaction.     Renette Butters

## 2020-07-21 NOTE — Op Note (Signed)
DATE OF SURGERY:  07/21/2020  TIME: 12:50 PM  PATIENT NAME:  Breanna [name]  AGE: 86 y.o.  PRE-OPERATIVE DIAGNOSIS:  right intertroch hip fracture  POST-OPERATIVE DIAGNOSIS:  SAME  PROCEDURE:  INTRAMEDULLARY (IM) NAIL INTERTROCHANTRIC  SURGEON:  Renette Butters  ASSISTANT:  Margy Clarks, PA-C, he was present and scrubbed throughout the case, critical for completion in a timely fashion, and for retraction, instrumentation, and closure.   OPERATIVE IMPLANTS: Stryker Gamma Nail  PREOPERATIVE INDICATIONS:  Breanna [name] is a 86 y.o. year old who fell and suffered a hip fracture. She was brought into the ER and then admitted and optimized and then elected for surgical intervention.    The risks benefits and alternatives were discussed with the patient including but not limited to the risks of nonoperative treatment, versus surgical intervention including infection, bleeding, nerve injury, malunion, nonunion, hardware prominence, hardware failure, need for hardware removal, blood clots, cardiopulmonary complications, morbidity, mortality, among others, and they were willing to proceed.    OPERATIVE PROCEDURE:  The patient was brought to the operating room and placed in the supine position. General anesthesia was administered. She was placed on the fracture table.  Closed reduction was performed under C-arm guidance. Time out was then performed after sterile prep and drape. She received preoperative antibiotics.  Incision was made proximal to the greater trochanter. A guidewire was placed in the appropriate position. Confirmation was made on AP and lateral views. The above-named nail was opened. I opened the proximal femur with a reamer. I then placed the nail by hand easily down. I did not need to ream the femur.  Once the nail was completely seated, I placed a guidepin into the femoral head into the center center position. I measured the length, and then reamed the lateral cortex and  up into the head. I then placed the lag screw. Slight compression was applied. Anatomic fixation achieved. Bone quality was mediocre.  I then secured the proximal interlocking bolt, and took off a half a turn, and then removed the instruments, and took final C-arm pictures AP and lateral the entire length of the leg.   Anatomic reconstruction was achieved, and the wounds were irrigated copiously and closed with Vicryl followed by staples and sterile gauze for the skin. The patient was awakened and returned to PACU in stable and satisfactory condition. There no complications and the patient tolerated the procedure well.  She will be weightbearing as tolerated, and will be on chemical px  for a period of four weeks after discharge.   Breanna [name], M.D.

## 2020-07-21 NOTE — H&P (Addendum)
[facility] History and Physical  Breanna [name] NLZ:[id number] DOB: 12/21/1931 DOA: 07/20/2020   PCP: Breanna Anger, [name]  Specialists: None  Chief Complaint: Pain in the right hip area and the lower back  HPI: Breanna [name] is a 86 y.o. female with a past medical history of gout, essential hypertension, glaucoma, history of osteoarthritis, diabetes mellitus type 2 who was in her usual state of health till earlier today when she was with a friend and was watering her plants when the patient made a sudden turn to the right and then lost her balance and fell onto her right side and her arm.  Denies hitting her head against the ground.  Pain is currently 10 out of 10 in intensity.  She is experiencing some muscle spasms as well.  She denies any chest pain shortness of breath.  No nausea vomiting.  She does mention pain in the lower back as well towards the right.  In the emergency department x-ray revealed fracture of the right femoral neck.  She will be hospitalized for further management.  She is noted to be bradycardic.  She is noted to be on beta-blockers.  Home Medications: Prior to Admission medications   Medication Sig Start Date End Date Taking? Authorizing Provider  acetaminophen (TYLENOL) 650 MG CR tablet Take 650 mg by mouth 2 (two) times daily as needed for pain.   Yes Breanna [name]  allopurinol (ZYLOPRIM) 300 MG tablet Take 0.5 tablets (150 mg total) by mouth daily. 03/02/20  Yes Plotnikov, Evie Lacks, [name]  amLODipine (NORVASC) 5 MG tablet Take 1 tablet (5 mg total) by mouth daily. 03/01/20  Yes Plotnikov, Evie Lacks, [name]  aspirin 81 MG EC tablet Take 81 mg by mouth daily.     Yes Breanna [name]  atropine 1 % ophthalmic solution Place 1 drop into the left eye daily as needed (Dry/irritated eyes).  10/21/19 10/20/20 Yes Breanna [name]  CALCIUM PO Take 1 tablet by mouth daily.    Yes Breanna [name]  carvedilol (COREG) 25 MG tablet Take 1  tablet (25 mg total) by mouth 2 (two) times daily with a meal. 03/01/20  Yes Plotnikov, Evie Lacks, [name]  Cholecalciferol (VITAMIN D3) 2000 UNITS capsule Take 2,000 Units by mouth daily.     Yes Breanna [name]  cyanocobalamin 2000 MCG tablet Take 2,000 mcg by mouth daily.   Yes Breanna [name]  diclofenac sodium (VOLTAREN) 1 % GEL Apply 2 g topically 2 (two) times daily. 07/25/17  Yes Plotnikov, Evie Lacks, [name]  diphenhydramine-acetaminophen (TYLENOL PM) 25-500 MG TABS 1 tablet at bedtime as needed (Sleep and Pain).    Yes Breanna [name]  dorzolamide-timolol (COSOPT) 22.3-6.8 MG/ML ophthalmic solution Place 1 drop into both eyes 2 (two) times daily.    Yes Breanna [name]  gabapentin (NEURONTIN) 100 MG capsule Take 1 capsule (100 mg total) by mouth 3 (three) times daily. 03/01/20  Yes Plotnikov, Evie Lacks, [name]  latanoprost (XALATAN) 0.005 % ophthalmic solution Place 1 drop into both eyes at bedtime.  04/26/16  Yes Breanna [name]  losartan (COZAAR) 25 MG tablet Take 50 mg by mouth daily. 05/09/20  Yes Breanna [name]  metFORMIN (GLUCOPHAGE) 500 MG tablet Take 1 tablet (500 mg total) by mouth daily with breakfast. 03/01/20  Yes Plotnikov, Evie Lacks, [name]  NON FORMULARY Place 10 drops under the tongue daily as needed (Pain). CBD oil    Yes Breanna [name]  pantoprazole (PROTONIX) 40  MG tablet Take 1 tablet (40 mg total) by mouth daily. 03/01/20  Yes Plotnikov, Evie Lacks, [name]  sertraline (ZOLOFT) 100 MG tablet Take 1 tablet (100 mg total) by mouth daily. 03/01/20  Yes Plotnikov, Evie Lacks, [name]  spironolactone (ALDACTONE) 25 MG tablet Take 0.5 tablets (12.5 mg total) by mouth daily. 03/02/20  Yes Plotnikov, Evie Lacks, [name]  torsemide (DEMADEX) 20 MG tablet TAKE 1/2 TABLET (10MG ) BY MOUTH DAILY AS NEEDED FOR EDEMA (SWELLING) Patient taking differently: Take 10 mg by mouth daily as needed (Edema (swelling)).  02/06/18  Yes Plotnikov, Evie Lacks, [name]    Allergies:   Allergies  Allergen Reactions  . Verapamil Shortness Of Breath    REACTION: SOB  . Aspirin Itching    But tolerates low dose  . Atenolol     REACTION: fatigue  . Codeine Itching  . Codeine Sulfate   . Hydrochlorothiazide     REACTION: gout  . Hydrocodone       side effects - hallucinations  . Ibuprofen     Upset stomach w/high doses  . Lisinopril     REACTION: cough  . Onion Other (See Comments)    Dry mouth/ gets sores  . Valsartan Itching  . Adhesive  [Tape] Rash    Past Medical History: Past Medical History:  Diagnosis Date  . Adrenal adenoma 2006  . Boils 2009  . Cholelithiasis    asympt. w/normal HIDA 03/2010  . CVA (cerebral infarction) 2010   Cerebellar  . Diverticulitis   . Esophageal spasm 2011  . GERD (gastroesophageal reflux disease)   . Gout   . History of colon polyps   . HTN (hypertension)   . Hydronephrosis    LEFT/ Surgical intervention  . Hyperlipidemia   . LBP (low back pain)   . Osteoarthritis   . Pulmonary HTN ([place])   . Stress   . Type II or unspecified type diabetes mellitus without mention of complication, not stated as uncontrolled     Past Surgical History:  Procedure Laterality Date  . ABDOMINAL HYSTERECTOMY    . APPENDECTOMY     2020  . BACK SURGERY    . BREAST BIOPSY     RIGHT  . CATARACT EXTRACTION, BILATERAL    . COLECTOMY  2006   Sigmoid  . FOOT SURGERY     BILATERAL  . HAMMER TOE SURGERY    . ROTATOR CUFF REPAIR  2008   RIGHT  . TOTAL KNEE ARTHROPLASTY  2003   LEFT  . VARICOSE VEIN SURGERY     vein stripping/lower extremities    Social History: Patient lives by herself.  She has good support system.  No history of smoking alcohol use or illicit drug use.   Family History:  Family History  Problem Relation Age of Onset  . Prostate cancer Father   . Hypertension Father   . Cancer Father        prostate  . Heart disease Mother   . Diabetes Mother   . Diabetes Other        1st degree relative  . Heart  disease Other   . Hypertension Other   . Prostate cancer Maternal Uncle   . Cancer Maternal Uncle        prostate  . Breast cancer Daughter   . Cancer Daughter        breast  . Colon cancer Neg Hx      Review of Systems - History obtained from the patient General  ROS: negative Psychological ROS: negative Ophthalmic ROS: negative ENT ROS: negative Allergy and Immunology ROS: negative Hematological and Lymphatic ROS: negative Endocrine ROS: negative Respiratory ROS: no cough, shortness of breath, or wheezing Cardiovascular ROS: no chest pain or dyspnea on exertion Gastrointestinal ROS: no abdominal pain, change in bowel habits, or black or bloody stools Genito-Urinary ROS: no dysuria, trouble voiding, or hematuria Musculoskeletal ROS: As in HPI Neurological ROS: no TIA or stroke symptoms Dermatological ROS: negative  Physical Examination  Vitals:   07/20/20 2053 07/20/20 2054 07/20/20 2208 07/21/20 0018  BP: (!) 145/60  (!) 161/63 (!) 167/63  Pulse: 48  45 53  Resp: 16  22 16   Temp:   98.4 F (36.9 C)   TempSrc:   Oral   SpO2: 100%  100% 100%  Weight:  64.9 kg    Height:  5\' 4"  (1.626 m)      BP (!) 167/63 (BP Location: Right Arm)   Pulse 53   Temp 98.4 F (36.9 C) (Oral)   Resp 16   Ht 5\' 4"  (1.626 m)   Wt 64.9 kg   SpO2 100%   BMI 24.55 kg/m   General appearance: alert, cooperative, appears stated age and no distress Head: Normocephalic, without obvious abnormality, atraumatic Eyes: conjunctivae/corneas clear. PERRL, EOM's intact.  Throat: lips, mucosa, and tongue normal; teeth and gums normal Neck: no adenopathy, no carotid bruit, no JVD, supple, symmetrical, trachea midline and thyroid not enlarged, symmetric, no tenderness/mass/nodules Resp: clear to auscultation bilaterally Cardio: S1-S2 is bradycardic regular.  No S3-S4.  No rubs or bruit. GI: soft, non-tender; bowel sounds normal; no masses,  no organomegaly Extremities: Right lower extremity is  externally rotated.  Able to move her left leg without any difficulty. Pulses: 2+ and symmetric Skin: Skin color, texture, turgor normal. No rashes or lesions Lymph nodes: Cervical, supraclavicular, and axillary nodes normal. Neurologic: Alert and oriented x3.  No obvious focal neurological deficits.  Able to lift her left leg without any difficulty.   Labs on Admission: I have personally reviewed following labs and imaging studies  CBC: Recent Labs  Lab 07/20/20 2101  WBC 6.0  NEUTROABS 4.4  HGB 9.7*  HCT 30.8*  MCV 90.6  PLT 98*   Basic Metabolic Panel: Recent Labs  Lab 07/20/20 2101  NA 140  K 4.6  CL 108  CO2 22  GLUCOSE 158*  BUN 30*  CREATININE 1.63*  CALCIUM 9.2   GFR: Estimated Creatinine Clearance: 21.4 mL/min (A) (by C-G formula based on SCr of 1.63 mg/dL (H)). Liver Function Tests: Recent Labs  Lab 07/20/20 2101  AST 23  ALT 12  ALKPHOS 81  BILITOT 0.5  PROT 6.4*  ALBUMIN 3.4*     Radiological Exams on Admission: DG Chest 1 View  Result Date: 07/20/2020 CLINICAL DATA:  Recent fall with chest pain, initial encounter EXAM: CHEST  1 VIEW COMPARISON:  08/13/2017 FINDINGS: Cardiac shadow is enlarged accentuated by the frontal technique. The lungs are well aerated without focal infiltrate or sizable effusion. No acute bony abnormality is noted. IMPRESSION: No acute abnormality seen. Electronically Signed   By: Inez Catalina M.D.   On: 07/20/2020 21:51   DG Elbow Complete Right  Result Date: 07/20/2020 CLINICAL DATA:  Recent fall with elbow pain, initial encounter EXAM: RIGHT ELBOW - COMPLETE 3+ VIEW COMPARISON:  None. FINDINGS: No acute fracture or dislocation is noted. Mild olecranon spurring is seen. No soft tissue abnormality is noted. IMPRESSION: No acute abnormality noted. Electronically Signed  By: Inez Catalina M.D.   On: 07/20/2020 21:50   DG Hip Unilat With Pelvis 2-3 Views Right  Result Date: 07/20/2020 CLINICAL DATA:  86 year old female with  fall. EXAM: DG HIP (WITH OR WITHOUT PELVIS) 2-3V RIGHT COMPARISON:  None. FINDINGS: Evaluation is limited due to body habitus and osteopenia. There is a displaced fracture of the lesser trochanter of the right femur. There is a linear lucency through the base of the right femoral neck most consistent with a nondisplaced basicervical fracture. Further evaluation with CT is recommended. There is no dislocation. Partially visualized lower lumbar fusion hardware. Multiple surgical wires noted over the pelvis. The soft tissues are unremarkable. IMPRESSION: Nondisplaced basicervical fracture of the right femoral neck with displaced fracture of the lesser trochanter. Further evaluation with CT recommended. Electronically Signed   By: Anner Crete M.D.   On: 07/20/2020 21:50    My interpretation of Electrocardiogram: Sinus bradycardia at 47 bpm.  T inversion noted in lead III and aVF.  No other ischemic changes noted.   Problem List  Active Problems:   Type 2 diabetes mellitus with renal manifestations, controlled ([place])   Essential hypertension   Cerebral artery occlusion with cerebral infarction [facility])   Hip fracture (H[facility])   Sinus bradycardia   Assessment: This is a 86 year old African-American female with past medical history as stated earlier who comes in after mechanical fall sustaining right hip fracture.  She also mentions pain in her lower back which could be related to her hip fracture but we need to rule out injury in the lumbar area.  She is noted to be bradycardic which is most likely due to medications.  Her TSH was normal when checked earlier this month.  She denies any cardiac symptoms currently.  Plan:  1. Right hip fracture and low back pain: EDP has contacted orthopedic surgery, Dr. Percell Miller who will operate on the patient later today.  Pain medications have been ordered.  Hip fracture protocol.  Patient complaining of low back pain as well.  Pain could be referred from her hip.   However we will get a lumbar film as well.  Based on the [place] Perioperative Risk Index the patient's estimated risk probability for perioperative myocardial infarction or cardiac arrest is 0.44%. Patient's procedure is intermediate risk. Patient's functional capacity is moderate. Based on the AHA/ACC algorithms patient may proceed to surgery without further testing.  She is noted to be on beta-blockers which ideally should be continued but her heart rate is noted to be in the 40s, early 40s at times.  Will hold her beta-blockers for now. Pulmonary toilet. Incentive spirometry post operatively.  2. Sinus bradycardia: She is noted to be on carvedilol which could be the reason for her bradycardia.  Her TSH was normal when checked earlier this month.  She is also noted to be on eyedrops with timolol which could also be contributing.  Hold these medications for now.  Monitor on telemetry.  3.  Chronic kidney disease stage IIIb: Creatinine seems to be close to her baseline.  Monitor urine output.  Check labs daily.  4.  Essential hypertension: Elevated blood pressure most likely secondary to pain.  Continue to monitor.  Resume her antihypertensives postoperatively.  Can use hydralazine as needed.  At home she is on amlodipine, carvedilol, Cozaar.  She is also on diuretics for lower extremity edema.  Hold Cozaar due to elevated creatinine.  5. Normocytic anemia: Likely anemia of chronic disease.  Hemoglobin close to baseline.  No evidence for any overt blood loss.  Continue to monitor.  6. Thrombocytopenia: Platelet count noted to be 98,000.  They were 162,000 earlier this month.  Etiology unclear.  Monitor trends.  No need for transfusions currently.  May undergo surgery with these counts.  7. Diabetes mellitus type 2, with renal complications with chronic kidney disease: On Metformin at home which will be held.  SSI.  HBA1C was 6.1 on July 06, 2020.  8. History of gout: Continue allopurinol.  DVT  Prophylaxis: SCDs for now.  Pharmacological prophylaxis depending on surgery as well as platelet counts. Code Status: Full code Family Communication: Discussed with the patient and her son Disposition: Will likely need to go to skilled nursing facility for rehab Consults called: Orthopedics called by EDP. Dr. Percell Miller Admission Status: Status is: Inpatient  Remains inpatient appropriate because:Ongoing active pain requiring inpatient pain management, IV treatments appropriate due to intensity of illness or inability to take PO and Inpatient level of care appropriate due to severity of illness   Dispo: The patient is from: Home              Anticipated d/c is to: SNF              Anticipated d/c date is: 3 days              Patient currently is not medically stable to d/c.     Severity of Illness: The appropriate patient status for this patient is INPATIENT. Inpatient status is judged to be reasonable and necessary in order to provide the required intensity of service to ensure the patient's safety. The patient's presenting symptoms, physical exam findings, and initial radiographic and laboratory data in the context of their chronic comorbidities is felt to place them at high risk for further clinical deterioration. Furthermore, it is not anticipated that the patient will be medically stable for discharge from the hospital within 2 midnights of admission. The following factors support the patient status of inpatient.   " The patient's presenting symptoms include right hip pain. " The worrisome physical exam findings include externally rotated right lower extremity. " The initial radiographic and laboratory data are worrisome because of right hip fracture. " The chronic co-morbidities include diabetes.   * I certify that at the point of admission it is my clinical judgment that the patient will require inpatient hospital care spanning beyond 2 midnights from the point of admission due to high  intensity of service, high risk for further deterioration and high frequency of surveillance required.*    Further management decisions will depend on results of further testing and patient's response to treatment.  Breanna [name] [employer]  [facility] operational officer on [email or web address]  07/21/2020, 12:24 AM

## 2020-07-21 NOTE — ED Notes (Signed)
Pt back and currently consulting with MD

## 2020-07-21 NOTE — ED Notes (Signed)
Pt off floor to imaging

## 2020-07-21 NOTE — Consult Note (Signed)
Reason for Consult:Right hip fx Referring Physician: Bennett Scrape Diamond is an 86 y.o. female.  HPI: Breanna [name] was at home when she turned a certain way and fell. She can't really articulate why she fell but knew she shouldn't have done what she did as it causes problems for her. She had immediate right hip pain and could not get up. She was brought to the ED where x-rays showed a right hip fx and orthopedic surgery was consulted. She c/o localized pain to the hip. She lives at home alone and doesn't use any assistive devices in the home but uses a cane or rollator if out.  Past Medical History:  Diagnosis Date  . Adrenal adenoma 2006  . Boils 2009  . Cholelithiasis    asympt. w/normal HIDA 03/2010  . CVA (cerebral infarction) 2010   Cerebellar  . Diverticulitis   . Esophageal spasm 2011  . GERD (gastroesophageal reflux disease)   . Gout   . History of colon polyps   . HTN (hypertension)   . Hydronephrosis    LEFT/ Surgical intervention  . Hyperlipidemia   . LBP (low back pain)   . Osteoarthritis   . Pulmonary HTN ([place])   . Stress   . Type II or unspecified type diabetes mellitus without mention of complication, not stated as uncontrolled     Past Surgical History:  Procedure Laterality Date  . ABDOMINAL HYSTERECTOMY    . APPENDECTOMY     2020  . BACK SURGERY    . BREAST BIOPSY     RIGHT  . CATARACT EXTRACTION, BILATERAL    . COLECTOMY  2006   Sigmoid  . FOOT SURGERY     BILATERAL  . HAMMER TOE SURGERY    . ROTATOR CUFF REPAIR  2008   RIGHT  . TOTAL KNEE ARTHROPLASTY  2003   LEFT  . VARICOSE VEIN SURGERY     vein stripping/lower extremities    Family History  Problem Relation Age of Onset  . Prostate cancer Father   . Hypertension Father   . Cancer Father        prostate  . Heart disease Mother   . Diabetes Mother   . Diabetes Other        1st degree relative  . Heart disease Other   . Hypertension Other   . Prostate cancer Maternal Uncle   . Cancer  Maternal Uncle        prostate  . Breast cancer Daughter   . Cancer Daughter        breast  . Colon cancer Neg Hx     Social History:  reports that she has never smoked. She has never used smokeless tobacco. She reports that she does not drink alcohol and does not use drugs.  Allergies:  Allergies  Allergen Reactions  . Verapamil Shortness Of Breath    REACTION: SOB  . Aspirin Itching    But tolerates low dose  . Atenolol     REACTION: fatigue  . Codeine Itching  . Codeine Sulfate   . Hydrochlorothiazide     REACTION: gout  . Hydrocodone       side effects - hallucinations  . Ibuprofen     Upset stomach w/high doses  . Lisinopril     REACTION: cough  . Onion Other (See Comments)    Dry mouth/ gets sores  . Valsartan Itching  . Adhesive  [Tape] Rash    Medications: I have reviewed the  patient's current medications.  Results for orders placed or performed during the hospital encounter of 07/20/20 (from the past 48 hour(s))  Comprehensive metabolic panel     Status: Abnormal   Collection Time: 07/20/20  9:01 PM  Result Value Ref Range   Sodium 140 135 - 145 mmol/L   Potassium 4.6 3.5 - 5.1 mmol/L   Chloride 108 98 - 111 mmol/L   CO2 22 22 - 32 mmol/L   Glucose, Bld 158 (H) 70 - 99 mg/dL    Comment: Glucose reference range applies only to samples taken after fasting for at least 8 hours.   BUN 30 (H) 8 - 23 mg/dL   Creatinine, Ser 1.63 (H) 0.44 - 1.00 mg/dL   Calcium 9.2 8.9 - 10.3 mg/dL   Total Protein 6.4 (L) 6.5 - 8.1 g/dL   Albumin 3.4 (L) 3.5 - 5.0 g/dL   AST 23 15 - 41 U/L   ALT 12 0 - 44 U/L   Alkaline Phosphatase 81 38 - 126 U/L   Total Bilirubin 0.5 0.3 - 1.2 mg/dL   GFR calc non Af Amer 28 (L) >60 mL/min   GFR calc Af Amer 33 (L) >60 mL/min   Anion gap 10 5 - 15    Comment: Performed at [facility] [address] [place], [place] [address]  CBC with Differential     Status: Abnormal   Collection Time: 07/20/20  9:01 PM  Result Value Ref  Range   WBC 6.0 4.0 - 10.5 K/uL   RBC 3.40 (L) 3.87 - 5.11 MIL/uL   Hemoglobin 9.7 (L) 12.0 - 15.0 g/dL   HCT 30.8 (L) 36 - 46 %   MCV 90.6 80.0 - 100.0 fL   MCH 28.5 26.0 - 34.0 pg   MCHC 31.5 30.0 - 36.0 g/dL   RDW 16.7 (H) 11.5 - 15.5 %   Platelets 98 (L) 150 - 400 K/uL    Comment: REPEATED TO VERIFY PLATELET COUNT CONFIRMED BY SMEAR    nRBC 0.5 (H) 0.0 - 0.2 %   Neutrophils Relative % 74 %   Neutro Abs 4.4 1.7 - 7.7 K/uL   Lymphocytes Relative 15 %   Lymphs Abs 0.9 0.7 - 4.0 K/uL   Monocytes Relative 6 %   Monocytes Absolute 0.4 0 - 1 K/uL   Eosinophils Relative 4 %   Eosinophils Absolute 0.3 0 - 0 K/uL   Basophils Relative 0 %   Basophils Absolute 0.0 0 - 0 K/uL   Immature Granulocytes 1 %   Abs Immature Granulocytes 0.05 0.00 - 0.07 K/uL    Comment: Performed at [facility], [place] [address] [place], [place] [address]  SARS Coronavirus 2 by RT PCR (hospital order, performed in [facility] hospital lab) Nasopharyngeal Nasopharyngeal Swab     Status: None   Collection Time: 07/20/20 10:18 PM   Specimen: Nasopharyngeal Swab  Result Value Ref Range   SARS Coronavirus 2 NEGATIVE NEGATIVE    Comment: (NOTE) SARS-CoV-2 target nucleic acids are NOT DETECTED.  The SARS-CoV-2 RNA is generally detectable in upper and lower respiratory specimens during the acute phase of infection. The lowest concentration of SARS-CoV-2 viral copies this assay can detect is 250 copies / mL. A negative result does not preclude SARS-CoV-2 infection and should not be used as the sole basis for treatment or other patient management decisions.  A negative result may occur with improper specimen collection / handling, submission of specimen other than nasopharyngeal  swab, presence of viral mutation(s) within the areas targeted by this assay, and inadequate number of viral copies (<250 copies / mL). A negative result must be combined with clinical observations, patient history, and  epidemiological information.  Fact Sheet for Patients:   StrictlyIdeas.no  Fact Sheet for Healthcare Providers: BankingDealers.co.za  This test is not yet approved or  cleared by the [place] FDA and has been authorized for detection and/or diagnosis of SARS-CoV-2 by FDA under an Emergency Use Authorization (EUA).  This EUA will remain in effect (meaning this test can be used) for the duration of the COVID-19 declaration under Section 564(b)(1) of the Act, 21 U.S.C. section 360bbb-3(b)(1), unless the authorization is terminated or revoked sooner.  Performed at [facility], [place] [address] [place], [place] [address]   CBC     Status: Abnormal   Collection Time: 07/21/20  4:46 AM  Result Value Ref Range   WBC 8.3 4.0 - 10.5 K/uL   RBC 3.31 (L) 3.87 - 5.11 MIL/uL   Hemoglobin 9.4 (L) 12.0 - 15.0 g/dL   HCT 29.2 (L) 36 - 46 %   MCV 88.2 80.0 - 100.0 fL   MCH 28.4 26.0 - 34.0 pg   MCHC 32.2 30.0 - 36.0 g/dL   RDW 16.2 (H) 11.5 - 15.5 %   Platelets 87 (L) 150 - 400 K/uL    Comment: REPEATED TO VERIFY Immature Platelet Fraction may be clinically indicated, consider ordering this additional test [id number] CONSISTENT WITH PREVIOUS RESULT    nRBC 0.2 0.0 - 0.2 %    Comment: Performed at [facility], [place] [address] [place], [place] [address]  Basic metabolic panel     Status: Abnormal   Collection Time: 07/21/20  4:46 AM  Result Value Ref Range   Sodium 141 135 - 145 mmol/L   Potassium 4.4 3.5 - 5.1 mmol/L   Chloride 109 98 - 111 mmol/L   CO2 23 22 - 32 mmol/L   Glucose, Bld 160 (H) 70 - 99 mg/dL    Comment: Glucose reference range applies only to samples taken after fasting for at least 8 hours.   BUN 27 (H) 8 - 23 mg/dL   Creatinine, Ser 1.55 (H) 0.44 - 1.00 mg/dL   Calcium 9.2 8.9 - 10.3 mg/dL   GFR calc non Af Amer 30 (L) >60 mL/min   GFR calc Af Amer 35 (L) >60 mL/min   Anion gap 9 5 - 15    Comment:  Performed at [facility] [address], [place], [place] [address]  Glucose, capillary     Status: Abnormal   Collection Time: 07/21/20  4:49 AM  Result Value Ref Range   Glucose-Capillary 167 (H) 70 - 99 mg/dL    Comment: Glucose reference range applies only to samples taken after fasting for at least 8 hours.  Surgical pcr screen     Status: None   Collection Time: 07/21/20  5:36 AM   Specimen: Nasal Mucosa; Nasal Swab  Result Value Ref Range   MRSA, PCR NEGATIVE NEGATIVE   Staphylococcus aureus NEGATIVE NEGATIVE    Comment: (NOTE) The Xpert SA Assay (FDA approved for NASAL specimens in patients 84 years of age and older), is one component of a comprehensive surveillance program. It is not intended to diagnose infection nor to guide or monitor treatment. Performed at [facility], [place] [address], [place], [place] [address]   Glucose, capillary  Status: Abnormal   Collection Time: 07/21/20  6:42 AM  Result Value Ref Range   Glucose-Capillary 138 (H) 70 - 99 mg/dL    Comment: Glucose reference range applies only to samples taken after fasting for at least 8 hours.    DG Chest 1 View  Result Date: 07/20/2020 CLINICAL DATA:  Recent fall with chest pain, initial encounter EXAM: CHEST  1 VIEW COMPARISON:  08/13/2017 FINDINGS: Cardiac shadow is enlarged accentuated by the frontal technique. The lungs are well aerated without focal infiltrate or sizable effusion. No acute bony abnormality is noted. IMPRESSION: No acute abnormality seen. Electronically Signed   By: Inez Catalina M.D.   On: 07/20/2020 21:51   DG Lumbar Spine 2-3 Views  Result Date: 07/21/2020 CLINICAL DATA:  Fall and back pain EXAM: LUMBAR SPINE - 2-3 VIEW COMPARISON:  June 04, 2016 FINDINGS: The patient is status post lumbar spine fixation from L3 through L5 with posterior spinal fixation hardware. No periprosthetic lucency or fracture is identified. Disc height loss with facet arthrosis is most notable at  L5-S1. There is diffuse osteopenia. IMPRESSION: Status post lumbar spine fixation from L3 through L5 without complication. No definite acute fracture or malalignment. Electronically Signed   By: Prudencio Pair M.D.   On: 07/21/2020 01:15   DG Elbow Complete Right  Result Date: 07/20/2020 CLINICAL DATA:  Recent fall with elbow pain, initial encounter EXAM: RIGHT ELBOW - COMPLETE 3+ VIEW COMPARISON:  None. FINDINGS: No acute fracture or dislocation is noted. Mild olecranon spurring is seen. No soft tissue abnormality is noted. IMPRESSION: No acute abnormality noted. Electronically Signed   By: Inez Catalina M.D.   On: 07/20/2020 21:50   CT Hip Right Wo Contrast  Result Date: 07/21/2020 CLINICAL DATA:  Fall fractured hip EXAM: CT OF THE RIGHT HIP WITHOUT CONTRAST TECHNIQUE: Multidetector CT imaging of the right hip was performed according to the standard protocol. Multiplanar CT image reconstructions were also generated. COMPARISON:  None. FINDINGS: Bones/Joint/Cartilage There is a comminuted mildly displaced fracture involving the right intratrochanteric region. The fracture line extends through the greater trochanter with a mildly displaced lesser trochanter. The femoral head is slightly rotated, however still well seated within the acetabulum. There is diffuse osteopenia noted. No other definite fracture is seen. Ligaments Suboptimally assessed by CT. Muscles and Tendons The muscles surrounding the hip appear to be grossly intact. The tendons appear to be intact. Soft tissues Mild soft tissue edema seen over the lateral aspect of the hip. The visualized deep pelvis is grossly unremarkable. IMPRESSION: Comminuted proximal right intratrochanteric femur fracture with mild displacement of the lesser trochanter. Electronically Signed   By: Prudencio Pair M.D.   On: 07/21/2020 00:25   DG Hip Unilat With Pelvis 2-3 Views Right  Result Date: 07/20/2020 CLINICAL DATA:  86 year old female with fall. EXAM: DG HIP (WITH  OR WITHOUT PELVIS) 2-3V RIGHT COMPARISON:  None. FINDINGS: Evaluation is limited due to body habitus and osteopenia. There is a displaced fracture of the lesser trochanter of the right femur. There is a linear lucency through the base of the right femoral neck most consistent with a nondisplaced basicervical fracture. Further evaluation with CT is recommended. There is no dislocation. Partially visualized lower lumbar fusion hardware. Multiple surgical wires noted over the pelvis. The soft tissues are unremarkable. IMPRESSION: Nondisplaced basicervical fracture of the right femoral neck with displaced fracture of the lesser trochanter. Further evaluation with CT recommended. Electronically Signed   By: Laren Everts.D.  On: 07/20/2020 21:50    Review of Systems  HENT: Negative for ear discharge, ear pain, hearing loss and tinnitus.   Eyes: Negative for photophobia and pain.  Respiratory: Negative for cough and shortness of breath.   Cardiovascular: Negative for chest pain.  Gastrointestinal: Negative for abdominal pain, nausea and vomiting.  Genitourinary: Negative for dysuria, flank pain, frequency and urgency.  Musculoskeletal: Positive for arthralgias (Right hip). Negative for back pain, myalgias and neck pain.  Neurological: Negative for dizziness and headaches.  Hematological: Does not bruise/bleed easily.  Psychiatric/Behavioral: The patient is not nervous/anxious.    Blood pressure (!) 118/48, pulse 52, temperature 98.4 F (36.9 C), temperature source Oral, resp. rate 18, height 5\' 4"  (1.626 m), weight 64.9 kg, SpO2 96 %. Physical Exam Constitutional:      General: She is not in acute distress.    Appearance: She is well-developed. She is not diaphoretic.  HENT:     Head: Normocephalic and atraumatic.  Eyes:     General: No scleral icterus.       Right eye: No discharge.        Left eye: No discharge.     Conjunctiva/sclera: Conjunctivae normal.  Cardiovascular:     Rate and  Rhythm: Normal rate and regular rhythm.  Pulmonary:     Effort: Pulmonary effort is normal. No respiratory distress.  Musculoskeletal:     Cervical back: Normal range of motion.     Comments: RLE No traumatic wounds, ecchymosis, or rash  Mild TTP hip  No knee or ankle effusion  Knee stable to varus/ valgus and anterior/posterior stress  Sens DPN, SPN, TN intact  Motor EHL, ext, flex, evers 5/5  DP 2+, PT 0, No significant edema  Skin:    General: Skin is warm and dry.  Neurological:     Mental Status: She is alert.  Psychiatric:        Behavior: Behavior normal.     Assessment/Plan: Right hip fx -- Plan IMN today by Dr. Percell Miller. Please keep NPO. Multiple medical problems including gout, essential hypertension, glaucoma, history of osteoarthritis, and diabetes mellitus type 2 -- per primary service    Lisette Abu, PA-C Orthopedic Surgery [phone] 07/21/2020, 9:55 AM

## 2020-07-21 NOTE — Telephone Encounter (Signed)
Breanna [name] wanted to let the MD know that his mother feel and broke her hip. She is in [place].  Breanna [name] states to call him if he'd like to talk.

## 2020-07-22 ENCOUNTER — Encounter (HOSPITAL_COMMUNITY): Payer: Self-pay | Admitting: Orthopedic Surgery

## 2020-07-22 LAB — COMPREHENSIVE METABOLIC PANEL
ALT: 8 U/L (ref 0–44)
AST: 17 U/L (ref 15–41)
Albumin: 2.5 g/dL — ABNORMAL LOW (ref 3.5–5.0)
Alkaline Phosphatase: 52 U/L (ref 38–126)
Anion gap: 9 (ref 5–15)
BUN: 39 mg/dL — ABNORMAL HIGH (ref 8–23)
CO2: 22 mmol/L (ref 22–32)
Calcium: 8.2 mg/dL — ABNORMAL LOW (ref 8.9–10.3)
Chloride: 109 mmol/L (ref 98–111)
Creatinine, Ser: 2.01 mg/dL — ABNORMAL HIGH (ref 0.44–1.00)
GFR calc Af Amer: 25 mL/min — ABNORMAL LOW (ref >60)
GFR calc non Af Amer: 22 mL/min — ABNORMAL LOW (ref >60)
Glucose, Bld: 132 mg/dL — ABNORMAL HIGH (ref 70–99)
Potassium: 5.3 mmol/L — ABNORMAL HIGH (ref 3.5–5.1)
Sodium: 140 mmol/L (ref 135–145)
Total Bilirubin: 0.1 mg/dL — ABNORMAL LOW (ref 0.3–1.2)
Total Protein: 5 g/dL — ABNORMAL LOW (ref 6.5–8.1)

## 2020-07-22 LAB — IRON AND TIBC
Iron: 25 ug/dL — ABNORMAL LOW (ref 28–170)
Saturation Ratios: 10 % — ABNORMAL LOW (ref 10.4–31.8)
TIBC: 252 ug/dL (ref 250–450)
UIBC: 227 ug/dL

## 2020-07-22 LAB — GLUCOSE, CAPILLARY
Glucose-Capillary: 122 mg/dL — ABNORMAL HIGH (ref 70–99)
Glucose-Capillary: 134 mg/dL — ABNORMAL HIGH (ref 70–99)
Glucose-Capillary: 124 mg/dL — ABNORMAL HIGH (ref 70–99)
Glucose-Capillary: 115 mg/dL — ABNORMAL HIGH (ref 70–99)

## 2020-07-22 LAB — CBC
HCT: 18.5 % — ABNORMAL LOW (ref 36.0–46.0)
Hemoglobin: 5.9 g/dL — CL (ref 12.0–15.0)
MCH: 28.9 pg (ref 26.0–34.0)
MCHC: 31.9 g/dL (ref 30.0–36.0)
MCV: 90.7 fL (ref 80.0–100.0)
Platelets: 65 10*3/uL — ABNORMAL LOW (ref 150–400)
RBC: 2.04 MIL/uL — ABNORMAL LOW (ref 3.87–5.11)
RDW: 16.7 % — ABNORMAL HIGH (ref 11.5–15.5)
WBC: 9.9 10*3/uL (ref 4.0–10.5)
nRBC: 1.1 % — ABNORMAL HIGH (ref 0.0–0.2)

## 2020-07-22 LAB — PREPARE RBC (CROSSMATCH)

## 2020-07-22 LAB — ABO/RH: ABO/RH(D): AB POS

## 2020-07-22 MED ORDER — CHLORHEXIDINE GLUCONATE CLOTH 2 % EX PADS
6.0000 | MEDICATED_PAD | Freq: Every day | CUTANEOUS | Status: DC
  Administered 2020-07-23 – 2020-07-29 (×7): 6 via TOPICAL

## 2020-07-22 MED ORDER — ACETAMINOPHEN 325 MG PO TABS
650.0000 mg | ORAL_TABLET | Freq: Four times a day (QID) | ORAL | Status: DC | PRN
  Administered 2020-07-22 – 2020-07-27 (×9): 650 mg via ORAL
  Filled 2020-07-22 (×9): qty 2

## 2020-07-22 MED ORDER — SODIUM CHLORIDE 0.9% IV SOLUTION: Freq: Once | INTRAVENOUS | Status: AC

## 2020-07-22 NOTE — Progress Notes (Signed)
CRITICAL VALUE ALERT  Critical Value:  Hemoglobin 5.9  Date & Time Notied:  07/30 09:25  Provider Notified: Dr. Wyline Copas  Orders Received/Actions taken: Pt to receive 2units of PRBC.

## 2020-07-22 NOTE — Discharge Instructions (Signed)
Weight bearing as tolerated.  Maintain dressings until follow up in the office. May shower Sunday. Cover Right thigh in saran wrap to keep water off of dressings.  Take aspirin twice a day to prevent blood clots.  If you have concerns please call the office. Follow up with Dr. Percell Miller in 2 weeks.

## 2020-07-22 NOTE — Progress Notes (Signed)
PT Cancellation Note  Patient Details Name: Breanna [name] MRN: [id number] DOB: 06/13/1931   Cancelled Treatment:    Reason Eval/Treat Not Completed: Medical issues which prohibited therapy - Hgb of 5.9 post-op, awaiting blood transfusion. PT to check back tomorrow per RN request.  Marisa Cyphers, [place] Pager [phone]  Office [phone]    Francella Barnett D [place] 07/22/2020, 1:13 PM

## 2020-07-22 NOTE — Evaluation (Signed)
Physical Therapy Evaluation Patient Details Name: Breanna [name] MRN: [id number] DOB: 10/03/1931 Today's Date: 07/22/2020   History of Present Illness  86 y.o. female with a past medical history of gout, essential hypertension, glaucoma, osteoarthritis, diabetes mellitus type 2, CVA, L TKR, R total shoulder, adrenal adenoma admitted to ED on 7/29 after fall, sustaining R hip femoral neck fracture. S/p R hip IM nail on 7/29.  Clinical Impression   Pt presents with LE weakness, severe R hip pain post-operatively, difficulty performing mobility tasks, inability to transfer/ambulate on eval, and decreased activity tolerance vs baseline. Pt to benefit from acute PT to address deficits. Pt required mod-max assist for bed mobility and attempted stands, unsuccessful at coming to full standing due to severe R hip pain. PT recommending SNF level of care post-acutely, pt in agreement. PT to progress mobility as tolerated, and will continue to follow acutely.      Follow Up Recommendations SNF;Supervision/Assistance - 24 hour    Equipment Recommendations  None recommended by PT    Recommendations for Other Services       Precautions / Restrictions Precautions Precautions: Fall Restrictions Weight Bearing Restrictions: No RLE Weight Bearing: Weight bearing as tolerated      Mobility  Bed Mobility Overal bed mobility: Needs Assistance Bed Mobility: Supine to Sit;Sit to Supine     Supine to sit: Mod assist;HOB elevated Sit to supine: Max assist;HOB elevated   General bed mobility comments: Mod-max assist for supine<>sit for trunk and LE management, scooting to and from EOB, and boost up in bed with use of bed pads. supine to sit attempts x2, first attempt terminated per pt request due to severe pain but motivated to try again.  Transfers Overall transfer level: Needs assistance Equipment used: Rolling walker (2 wheeled) Transfers: Sit to/from Stand Sit to Stand: Max assist;From elevated  surface         General transfer comment: Max assist for power up, and attempted rise to standing but pt unable due to severe pain. Multiple attempts, tried with bed elevated, with rocking for momentum, all unsuccessful.  Ambulation/Gait             General Gait Details: unable  Stairs            Wheelchair Mobility    Modified Rankin (Stroke Patients Only)       Balance Overall balance assessment: Needs assistance Sitting-balance support: Bilateral upper extremity supported;Feet unsupported Sitting balance-Leahy Scale: Fair Sitting balance - Comments: able to sit EOB unsuported once at EOB     Standing balance-Leahy Scale: Poor Standing balance comment: unable to come to full standing, completely reliant on AD and PT                             Pertinent Vitals/Pain Pain Assessment: 0-10 Pain Score: 9  Pain Location: R hip Pain Descriptors / Indicators: Sore;Discomfort;Aching;Grimacing;Guarding Pain Intervention(s): Limited activity within patient's tolerance;Monitored during session;Repositioned    Home Living Family/patient expects to be discharged to:: Private residence Living Arrangements: Alone Available Help at Discharge: Family;Friend(s);Available PRN/intermittently Type of Home: House Home Access: Stairs to enter Entrance Stairs-Rails: Psychiatric nurse of Steps: 5 Home Layout: One level Home Equipment: Cane - single point;Walker - 4 wheels      Prior Function Level of Independence: Independent with assistive device(s)         Comments: using cane for ambulation PTA, occasionally using RW     Hand Dominance  Dominant Hand: Right    Extremity/Trunk Assessment   Upper Extremity Assessment Upper Extremity Assessment: Defer to OT evaluation    Lower Extremity Assessment Lower Extremity Assessment: Generalized weakness;RLE deficits/detail RLE Deficits / Details: suspected post-operative weakness; able to  perform quad set, heel slide, ankle pump RLE: Unable to fully assess due to pain RLE Sensation: WNL    Cervical / Trunk Assessment Cervical / Trunk Assessment: Normal  Communication   Communication: No difficulties  Cognition Arousal/Alertness: Awake/alert Behavior During Therapy: WFL for tasks assessed/performed Overall Cognitive Status: Within Functional Limits for tasks assessed                                        General Comments General comments (skin integrity, edema, etc.): Hgb 5.9, pt very motivated to mobilize and PT was notified by RN to see pt for mobility as tolerated    Exercises General Exercises - Lower Extremity Quad Sets: AROM;Right;5 reps;Supine Heel Slides: AAROM;Right;5 reps;Supine   Assessment/Plan    PT Assessment Patient needs continued PT services  PT Problem List Decreased strength;Decreased mobility;Decreased safety awareness;Decreased activity tolerance;Decreased balance;Decreased knowledge of use of DME;Pain       PT Treatment Interventions DME instruction;Therapeutic activities;Gait training;Therapeutic exercise;Patient/family education;Balance training;Functional mobility training;Neuromuscular re-education    PT Goals (Current goals can be found in the Care Plan section)  Acute Rehab PT Goals Patient Stated Goal: get stronger at rehab PT Goal Formulation: With patient Time For Goal Achievement: 08/05/20 Potential to Achieve Goals: Good    Frequency Min 3X/week   Barriers to discharge        Co-evaluation               AM-PAC PT "6 Clicks" Mobility  Outcome Measure Help needed turning from your back to your side while in a flat bed without using bedrails?: A Lot Help needed moving from lying on your back to sitting on the side of a flat bed without using bedrails?: A Lot Help needed moving to and from a bed to a chair (including a wheelchair)?: Total Help needed standing up from a chair using your arms (e.g.,  wheelchair or bedside chair)?: Total Help needed to walk in hospital room?: Total Help needed climbing 3-5 steps with a railing? : Total 6 Click Score: 8    End of Session Equipment Utilized During Treatment: Gait belt Activity Tolerance: Patient limited by pain Patient left: in bed;with call bell/phone within reach;with bed alarm set;with SCD's reapplied Nurse Communication: Mobility status PT Visit Diagnosis: Other abnormalities of gait and mobility (R26.89);Muscle weakness (generalized) (M62.81);Difficulty in walking, not elsewhere classified (R26.2)    Time: 1445-1520 PT Time Calculation (min) (ACUTE ONLY): 35 min   Charges:   PT Evaluation $PT Eval Low Complexity: 1 Low PT Treatments $Therapeutic Activity: 8-22 mins       Dawnmarie Breon E, PT Acute Rehabilitation Services Pager [phone]  Office [phone]  Saylor Sheckler D Elonda Husky 07/22/2020, 3:51 PM

## 2020-07-22 NOTE — TOC CAGE-AID Note (Signed)
Transition of Care [facility]) - CAGE-AID Screening   Patient Details  Name: Breanna [name] MRN: [id number] Date of Birth: [birth year]  Transition of Care [facility]) CM/SW Contact:    Emeterio Reeve, [place] Phone Number: 07/22/2020, 2:44 PM   Clinical Narrative:  CSW met with pt at bedside. CSW introduced self and explained her role at the hospital.  Pt denied alcohol use and substance use.  CAGE-AID Screening:    Have You Ever Felt You Ought to Cut Down on Your Drinking or Drug Use?: No Have People Annoyed You By Critizing Your Drinking Or Drug Use?: No Have You Felt Bad Or Guilty About Your Drinking Or Drug Use?: No Have You Ever Had a Drink or Used Drugs First Thing In The Morning to Steady Your Nerves or to Get Rid of a Hangover?: No CAGE-AID Score: 0  Substance Abuse Education Offered: Yes    Blima Ledger, [place] Social Worker [phone]

## 2020-07-22 NOTE — Plan of Care (Signed)

## 2020-07-22 NOTE — Progress Notes (Signed)
PROGRESS NOTE    Breanna [name]  UXN:[id number] DOB: 08-Mar-1931 DOA: 07/20/2020 PCP: Cassandria Anger, MD    Brief Narrative:  86 y.o. female with a past medical history of gout, essential hypertension, glaucoma, history of osteoarthritis, diabetes mellitus type 2 who was in her usual state of health till earlier today when she was with a friend and was watering her plants when the patient made a sudden turn to the right and then lost her balance and fell onto her right side and her arm.  Denies hitting her head against the ground.  Pain is currently 10 out of 10 in intensity.  She is experiencing some muscle spasms as well.  She denies any chest pain shortness of breath.  No nausea vomiting.  She does mention pain in the lower back as well towards the right.  In the emergency department x-ray revealed fracture of the right femoral neck.  She will be hospitalized for further management.  She is noted to be bradycardic.  She is noted to be on beta-blockers.  Assessment & Plan:   Active Problems:   Type 2 diabetes mellitus with renal manifestations, controlled (HCC)   Essential hypertension   Cerebral artery occlusion with cerebral infarction [facility])   Hip fracture (HCC)   Sinus bradycardia  1. Right hip fracture and low back pain:  -orthopedic surgery consulted -Pt now s/p surgery 7/29 -PT/OT post op per orthopedic surgery -Would continue with analgesia as needed,.  Patient currently appears comfortable  2. Sinus bradycardia:  -She is noted to be on carvedilol prior to admit, currently on hold -Her TSH was normal when checked earlier this month.   -Heart rate currently within normal limits  3.  Chronic kidney disease stage IIIb:  -Creatinine has risen to over 2 -Continue to monitor urine output.  -Suspect rising creatinine secondary to below anemia  4.  Essential hypertension:  -At home she is on amlodipine, carvedilol, Cozaar.   -held Cozaar due to elevated creatinine  and coreg per above for presenting bradycardia -Pressure remained stable controlled  5. Normocytic anemia: Likely anemia of chronic disease.   -Hemoglobin initially noted to be near baseline prior to surgery -See below, hemoglobin down to 5 this morning  6. Thrombocytopenia:  -Platelet count noted to be 98,000 at time of presentation trending down to the 60s this morning -We will repeat CBC in the morning  7. Diabetes mellitus type 2, with renal complications with chronic kidney disease:  -On Metformin at home which will be held while in hospital. -Continue  SSI.  HBA1C was 6.1 on July 06, 2020.  8. History of gout: Continue allopurinol.  9. Acute blood loss anemia -Suspect secondary to recent surgery. -Patient was made aware of her anemia, agreeable to blood transfusion -Have ordered 2 units of packed red blood cells -Repeat CBC in the morning, continue to transfuse as needed  DVT prophylaxis: SCD's Code Status: Full Family Communication: Pt in room, family not at bedside  Status is: Inpatient  Remains inpatient appropriate because:Ongoing diagnostic testing needed not appropriate for outpatient work up and Unsafe d/c plan   Dispo: The patient is from: Home              Anticipated d/c is to: Pending PT eval              Anticipated d/c date is: 2 days              Patient currently is not medically stable to  d/c.       Consultants:   Orthopedic Surgery  Procedures:  Intramedullary nail intertrochantric 7/29  Antimicrobials: Anti-infectives (From admission, onward)   Start     Dose/Rate Route Frequency Ordered Stop   07/21/20 1800  ceFAZolin (ANCEF) IVPB 1 g/50 mL premix        1 g 100 mL/hr over 30 Minutes Intravenous Every 6 hours 07/21/20 1419 07/22/20 0618   07/21/20 1100  ceFAZolin (ANCEF) IVPB 2g/100 mL premix        2 g 200 mL/hr over 30 Minutes Intravenous On call to O.R. 07/21/20 1004 07/21/20 1200      Subjective: Patient seen this morning  while eating breakfast, no complaints denies nausea  Objective: Vitals:   07/21/20 1920 07/21/20 2353 07/22/20 0435 07/22/20 0737  BP: (!) 109/58 (!) 108/59 (!) 111/56 (!) 118/51  Pulse: 58 65 70 65  Resp: 17 15 16 16   Temp: 99.3 F (37.4 C) 98.6 F (37 C) 98.1 F (36.7 C) 98.5 F (36.9 C)  TempSrc: Oral Oral Oral Oral  SpO2: 98% 100% 99% 100%  Weight:      Height:        Intake/Output Summary (Last 24 hours) at 07/22/2020 1513 Last data filed at 07/22/2020 1100 Gross per 24 hour  Intake 1846.84 ml  Output 800 ml  Net 1046.84 ml   Filed Weights   07/20/20 2054  Weight: 64.9 kg    Examination: General exam: Awake, laying in bed, in nad Respiratory system: Normal respiratory effort, no wheezing Cardiovascular system: regular rate, s1, s2 Gastrointestinal system: Soft, nondistended, positive BS Central nervous system: CN2-12 grossly intact, strength intact Extremities: Perfused, no clubbing Skin: Normal skin turgor, no notable skin lesions seen Psychiatry: Mood normal // no visual hallucinations   Data Reviewed: I have personally reviewed following labs and imaging studies  CBC: Recent Labs  Lab 07/20/20 2101 07/21/20 0446 07/22/20 0801  WBC 6.0 8.3 9.9  NEUTROABS 4.4  --   --   HGB 9.7* 9.4* 5.9*  HCT 30.8* 29.2* 18.5*  MCV 90.6 88.2 90.7  PLT 98* 87* 65*   Basic Metabolic Panel: Recent Labs  Lab 07/20/20 2101 07/21/20 0446 07/22/20 0801  NA 140 141 140  K 4.6 4.4 5.3*  CL 108 109 109  CO2 22 23 22   GLUCOSE 158* 160* 132*  BUN 30* 27* 39*  CREATININE 1.63* 1.55* 2.01*  CALCIUM 9.2 9.2 8.2*   GFR: Estimated Creatinine Clearance: 17.3 mL/min (A) (by C-G formula based on SCr of 2.01 mg/dL (H)). Liver Function Tests: Recent Labs  Lab 07/20/20 2101 07/22/20 0801  AST 23 17  ALT 12 8  ALKPHOS 81 52  BILITOT 0.5 0.1*  PROT 6.4* 5.0*  ALBUMIN 3.4* 2.5*   No results for input(s): LIPASE, AMYLASE in the last 168 hours. No results for input(s):  AMMONIA in the last 168 hours. Coagulation Profile: No results for input(s): INR, PROTIME in the last 168 hours. Cardiac Enzymes: No results for input(s): CKTOTAL, CKMB, CKMBINDEX, TROPONINI in the last 168 hours. BNP (last 3 results) No results for input(s): PROBNP in the last 8760 hours. HbA1C: No results for input(s): HGBA1C in the last 72 hours. CBG: Recent Labs  Lab 07/21/20 1331 07/21/20 1834 07/21/20 2041 07/22/20 0700 07/22/20 1155  GLUCAP 110* 126* 182* 122* 134*   Lipid Profile: No results for input(s): CHOL, HDL, LDLCALC, TRIG, CHOLHDL, LDLDIRECT in the last 72 hours. Thyroid Function Tests: No results for input(s): TSH, T4TOTAL, FREET4,  T3FREE, THYROIDAB in the last 72 hours. Anemia Panel: Recent Labs    07/22/20 1102  TIBC 252  IRON 25*   Sepsis Labs: No results for input(s): PROCALCITON, LATICACIDVEN in the last 168 hours.  Recent Results (from the past 240 hour(s))  SARS Coronavirus 2 by RT PCR (hospital order, performed in [facility] hospital lab) Nasopharyngeal Nasopharyngeal Swab     Status: None   Collection Time: 07/20/20 10:18 PM   Specimen: Nasopharyngeal Swab  Result Value Ref Range Status   SARS Coronavirus 2 NEGATIVE NEGATIVE Final    Comment: (NOTE) SARS-CoV-2 target nucleic acids are NOT DETECTED.  The SARS-CoV-2 RNA is generally detectable in upper and lower respiratory specimens during the acute phase of infection. The lowest concentration of SARS-CoV-2 viral copies this assay can detect is 250 copies / mL. A negative result does not preclude SARS-CoV-2 infection and should not be used as the sole basis for treatment or other patient management decisions.  A negative result may occur with improper specimen collection / handling, submission of specimen other than nasopharyngeal swab, presence of viral mutation(s) within the areas targeted by this assay, and inadequate number of viral copies (<250 copies / mL). A negative result must be  combined with clinical observations, patient history, and epidemiological information.  Fact Sheet for Patients:   StrictlyIdeas.no  Fact Sheet for Healthcare Providers: BankingDealers.co.za  This test is not yet approved or  cleared by the [place] FDA and has been authorized for detection and/or diagnosis of SARS-CoV-2 by FDA under an Emergency Use Authorization (EUA).  This EUA will remain in effect (meaning this test can be used) for the duration of the COVID-19 declaration under Section 564(b)(1) of the Act, 21 U.S.C. section 360bbb-3(b)(1), unless the authorization is terminated or revoked sooner.  Performed at [facility], [place] [address] [place], [place] [address]   Surgical pcr screen     Status: None   Collection Time: 07/21/20  5:36 AM   Specimen: Nasal Mucosa; Nasal Swab  Result Value Ref Range Status   MRSA, PCR NEGATIVE NEGATIVE Final   Staphylococcus aureus NEGATIVE NEGATIVE Final    Comment: (NOTE) The Xpert SA Assay (FDA approved for NASAL specimens in patients 47 years of age and older), is one component of a comprehensive surveillance program. It is not intended to diagnose infection nor to guide or monitor treatment. Performed at [facility], [place] [address] [place], [place] [address]      Radiology Studies: DG Chest 1 View  Result Date: 07/20/2020 CLINICAL DATA:  Recent fall with chest pain, initial encounter EXAM: CHEST  1 VIEW COMPARISON:  08/13/2017 FINDINGS: Cardiac shadow is enlarged accentuated by the frontal technique. The lungs are well aerated without focal infiltrate or sizable effusion. No acute bony abnormality is noted. IMPRESSION: No acute abnormality seen. Electronically Signed   By: Inez Catalina M.D.   On: 07/20/2020 21:51   DG Lumbar Spine 2-3 Views  Result Date: 07/21/2020 CLINICAL DATA:  Fall and back pain EXAM: LUMBAR SPINE - 2-3 VIEW COMPARISON:  June 04, 2016  FINDINGS: The patient is status post lumbar spine fixation from L3 through L5 with posterior spinal fixation hardware. No periprosthetic lucency or fracture is identified. Disc height loss with facet arthrosis is most notable at L5-S1. There is diffuse osteopenia. IMPRESSION: Status post lumbar spine fixation from L3 through L5 without complication. No definite acute fracture or malalignment. Electronically Signed   By: Ebony Cargo.D.  On: 07/21/2020 01:15   DG Elbow Complete Right  Result Date: 07/20/2020 CLINICAL DATA:  Recent fall with elbow pain, initial encounter EXAM: RIGHT ELBOW - COMPLETE 3+ VIEW COMPARISON:  None. FINDINGS: No acute fracture or dislocation is noted. Mild olecranon spurring is seen. No soft tissue abnormality is noted. IMPRESSION: No acute abnormality noted. Electronically Signed   By: Inez Catalina M.D.   On: 07/20/2020 21:50   CT Hip Right Wo Contrast  Result Date: 07/21/2020 CLINICAL DATA:  Fall fractured hip EXAM: CT OF THE RIGHT HIP WITHOUT CONTRAST TECHNIQUE: Multidetector CT imaging of the right hip was performed according to the standard protocol. Multiplanar CT image reconstructions were also generated. COMPARISON:  None. FINDINGS: Bones/Joint/Cartilage There is a comminuted mildly displaced fracture involving the right intratrochanteric region. The fracture line extends through the greater trochanter with a mildly displaced lesser trochanter. The femoral head is slightly rotated, however still well seated within the acetabulum. There is diffuse osteopenia noted. No other definite fracture is seen. Ligaments Suboptimally assessed by CT. Muscles and Tendons The muscles surrounding the hip appear to be grossly intact. The tendons appear to be intact. Soft tissues Mild soft tissue edema seen over the lateral aspect of the hip. The visualized deep pelvis is grossly unremarkable. IMPRESSION: Comminuted proximal right intratrochanteric femur fracture with mild displacement of  the lesser trochanter. Electronically Signed   By: Prudencio Pair M.D.   On: 07/21/2020 00:25   DG C-Arm 1-60 Min  Result Date: 07/21/2020 CLINICAL DATA:  Right hip ORIF EXAM: RIGHT FEMUR 2 VIEWS; DG C-ARM 1-60 MIN COMPARISON:  X-ray 07/20/2020 FINDINGS: 3 C-arm fluoroscopic images were obtained intraoperatively and submitted for post operative interpretation. Interval placement of long IM rod with proximal lag screw traversing intertrochanteric fracture of the right femur with good anatomic alignment. 37 seconds of fluoroscopy time was utilized. Please see the performing provider's procedural report for further detail. IMPRESSION: As above. Electronically Signed   By: Davina Poke D.O.   On: 07/21/2020 13:41   DG Hip Unilat With Pelvis 2-3 Views Right  Result Date: 07/20/2020 CLINICAL DATA:  86 year old female with fall. EXAM: DG HIP (WITH OR WITHOUT PELVIS) 2-3V RIGHT COMPARISON:  None. FINDINGS: Evaluation is limited due to body habitus and osteopenia. There is a displaced fracture of the lesser trochanter of the right femur. There is a linear lucency through the base of the right femoral neck most consistent with a nondisplaced basicervical fracture. Further evaluation with CT is recommended. There is no dislocation. Partially visualized lower lumbar fusion hardware. Multiple surgical wires noted over the pelvis. The soft tissues are unremarkable. IMPRESSION: Nondisplaced basicervical fracture of the right femoral neck with displaced fracture of the lesser trochanter. Further evaluation with CT recommended. Electronically Signed   By: Anner Crete M.D.   On: 07/20/2020 21:50   DG FEMUR, MIN 2 VIEWS RIGHT  Result Date: 07/21/2020 CLINICAL DATA:  Right hip ORIF EXAM: RIGHT FEMUR 2 VIEWS; DG C-ARM 1-60 MIN COMPARISON:  X-ray 07/20/2020 FINDINGS: 3 C-arm fluoroscopic images were obtained intraoperatively and submitted for post operative interpretation. Interval placement of long IM rod with  proximal lag screw traversing intertrochanteric fracture of the right femur with good anatomic alignment. 37 seconds of fluoroscopy time was utilized. Please see the performing provider's procedural report for further detail. IMPRESSION: As above. Electronically Signed   By: Davina Poke D.O.   On: 07/21/2020 13:41    Scheduled Meds: . sodium chloride   Intravenous Once  . allopurinol  150 mg Oral Daily  . aspirin EC  81 mg Oral Daily  . Chlorhexidine Gluconate Cloth  6 each Topical Daily  . docusate sodium  100 mg Oral BID  . feeding supplement (GLUCERNA SHAKE)  237 mL Oral BID BM  . gabapentin  100 mg Oral TID  . insulin aspart  0-5 Units Subcutaneous QHS  . insulin aspart  0-9 Units Subcutaneous TID WC  . latanoprost  1 drop Both Eyes QHS  . multivitamin with minerals  1 tablet Oral Daily  . pantoprazole  40 mg Oral Daily  . senna  1 tablet Oral BID  . sertraline  100 mg Oral Daily   Continuous Infusions: . sodium chloride 75 mL/hr at 07/22/20 1100     LOS: 1 day   Marylu Lund, MD [facility] Pager On Amion  If 7PM-7AM, please contact night-coverage 07/22/2020, 3:13 PM

## 2020-07-22 NOTE — Telephone Encounter (Signed)
I was aware.  I am sorry.  Thanks

## 2020-07-22 NOTE — Progress Notes (Signed)
    Subjective: Patient reports pain as mild.  Tolerating diet.  No CP, SOB.    No weakness/dizziness.   Not yet mobilized.  Objective:   VITALS:   Vitals:   07/21/20 1920 07/21/20 2353 07/22/20 0435 07/22/20 0737  BP: (!) 109/58 (!) 108/59 (!) 111/56 (!) 118/51  Pulse: 58 65 70 65  Resp: 17 15 16 16   Temp: 99.3 F (37.4 C) 98.6 F (37 C) 98.1 F (36.7 C) 98.5 F (36.9 C)  TempSrc: Oral Oral Oral Oral  SpO2: 98% 100% 99% 100%  Weight:      Height:       CBC Latest Ref Rng & Units 07/22/2020 07/21/2020 07/20/2020  WBC 4.0 - 10.5 K/uL 9.9 8.3 6.0  Hemoglobin 12.0 - 15.0 g/dL 5.9(LL) 9.4(L) 9.7(L)  Hematocrit 36 - 46 % 18.5(L) 29.2(L) 30.8(L)  Platelets 150 - 400 K/uL 65(L) 87(L) 98(L)   BMP Latest Ref Rng & Units 07/22/2020 07/21/2020 07/20/2020  Glucose 70 - 99 mg/dL 132(H) 160(H) 158(H)  BUN 8 - 23 mg/dL 39(H) 27(H) 30(H)  Creatinine 0.44 - 1.00 mg/dL 2.01(H) 1.55(H) 1.63(H)  BUN/Creat Ratio 6 - 22 (calc) - - -  Sodium 135 - 145 mmol/L 140 141 140  Potassium 3.5 - 5.1 mmol/L 5.3(H) 4.4 4.6  Chloride 98 - 111 mmol/L 109 109 108  CO2 22 - 32 mmol/L 22 23 22   Calcium 8.9 - 10.3 mg/dL 8.2(L) 9.2 9.2   Intake/Output      07/29 0701 - 07/30 0700 07/30 0701 - 07/31 0700   P.O.  240   I.V. (mL/kg) 1000 (15.4) 506.8 (7.8)   IV Piggyback 100    Total Intake(mL/kg) 1100 (16.9) 746.8 (11.5)   Urine (mL/kg/hr) 1600 (1)    Blood 150    Total Output 1750    Net -650 +746.8            Physical Exam: General: NAD.  Upright in bed.  Calm, conversant.  No increased work of breathing. MSK RLE Neurovascularly intact Sensation intact distally Intact pulses distally Dorsiflexion/Plantar flexion intact Incision: dressing C/D/I   Assessment: 1 Day Post-Op  S/P Procedure(s) (LRB): INTRAMEDULLARY (IM) NAIL INTERTROCHANTRIC (Right) by Dr. Ernesta Amble. Percell Miller on 07/21/2020  Active Problems:   Type 2 diabetes mellitus with renal manifestations, controlled ([place])   Essential  hypertension   Cerebral artery occlusion with cerebral infarction [facility])   Hip fracture (HCC)   Sinus bradycardia    Closed right intertrochanteric femur fracture, status post IM nail Acute on chronic anemia  Chronic anemia and acute blood loss anemia Doing well postop day 1 Tolerating diet and voiding Pain controlled Not yet mobilized   Plan: Transfusion ordered by medical team (appreciate the assistance) Up with therapy when able. Incentive Spirometry Apply ice PRN  Weightbearing: WBAT to RLE Insicional and dressing care: Dressings left intact until follow-up Showering: Keep dressing dry VTE prophylaxis: ASA once anemia resolved, SCDs, ambulation Pain control: Minimize narcotics.  Continue current regimen. Follow - up plan: 2 weeks Contact information:  Edmonia Lynch MD, Roxan Hockey PA-C  Dispo: TBD.  Therapy evaluations pending.  Discharge when mobilized and ready medically.  Rachael Fee, PA-C 07/22/2020, 1:16 PM

## 2020-07-22 NOTE — Progress Notes (Signed)
OT Cancellation Note  Patient Details Name: Breanna [name] MRN: [id number] DOB: 02/16/1931   Cancelled Treatment:    Reason Eval/Treat Not Completed: Patient not medically ready Pt with Hgb of 5.9 post-op, awaiting blood transfusion. OT will continue to follow and return as time allows and pt is appropriate.   [facility] OTR/L Acute Rehabilitation Services Office: [place] 07/22/2020, 1:48 PM

## 2020-07-22 NOTE — Plan of Care (Signed)
  Problem: Education: Goal: Knowledge of General Education information will improve Description: Including pain rating scale, medication(s)/side effects and non-pharmacologic comfort measures Outcome: Progressing   Problem: Education: Goal: Knowledge of General Education information will improve Description: Including pain rating scale, medication(s)/side effects and non-pharmacologic comfort measures Outcome: Progressing   Problem: Education: Goal: Knowledge of General Education information will improve Description: Including pain rating scale, medication(s)/side effects and non-pharmacologic comfort measures Outcome: Progressing   Problem: Education: Goal: Knowledge of General Education information will improve Description: Including pain rating scale, medication(s)/side effects and non-pharmacologic comfort measures Outcome: Progressing   

## 2020-07-23 LAB — CBC
HCT: 23.2 % — ABNORMAL LOW (ref 36.0–46.0)
Hemoglobin: 7.6 g/dL — ABNORMAL LOW (ref 12.0–15.0)
MCH: 27.8 pg (ref 26.0–34.0)
MCHC: 32.8 g/dL (ref 30.0–36.0)
MCV: 85 fL (ref 80.0–100.0)
Platelets: 60 10*3/uL — ABNORMAL LOW (ref 150–400)
RBC: 2.73 MIL/uL — ABNORMAL LOW (ref 3.87–5.11)
RDW: 17.2 % — ABNORMAL HIGH (ref 11.5–15.5)
WBC: 12.5 10*3/uL — ABNORMAL HIGH (ref 4.0–10.5)
nRBC: 1 % — ABNORMAL HIGH (ref 0.0–0.2)

## 2020-07-23 LAB — COMPREHENSIVE METABOLIC PANEL
ALT: 6 U/L (ref 0–44)
AST: 18 U/L (ref 15–41)
Albumin: 2.4 g/dL — ABNORMAL LOW (ref 3.5–5.0)
Alkaline Phosphatase: 45 U/L (ref 38–126)
Anion gap: 8 (ref 5–15)
BUN: 43 mg/dL — ABNORMAL HIGH (ref 8–23)
CO2: 21 mmol/L — ABNORMAL LOW (ref 22–32)
Calcium: 7.9 mg/dL — ABNORMAL LOW (ref 8.9–10.3)
Chloride: 111 mmol/L (ref 98–111)
Creatinine, Ser: 1.95 mg/dL — ABNORMAL HIGH (ref 0.44–1.00)
GFR calc Af Amer: 26 mL/min — ABNORMAL LOW (ref >60)
GFR calc non Af Amer: 23 mL/min — ABNORMAL LOW (ref >60)
Glucose, Bld: 128 mg/dL — ABNORMAL HIGH (ref 70–99)
Potassium: 4.6 mmol/L (ref 3.5–5.1)
Sodium: 140 mmol/L (ref 135–145)
Total Bilirubin: 0.9 mg/dL (ref 0.3–1.2)
Total Protein: 5.1 g/dL — ABNORMAL LOW (ref 6.5–8.1)

## 2020-07-23 LAB — GLUCOSE, CAPILLARY
Glucose-Capillary: 130 mg/dL — ABNORMAL HIGH (ref 70–99)
Glucose-Capillary: 125 mg/dL — ABNORMAL HIGH (ref 70–99)
Glucose-Capillary: 115 mg/dL — ABNORMAL HIGH (ref 70–99)
Glucose-Capillary: 129 mg/dL — ABNORMAL HIGH (ref 70–99)

## 2020-07-23 NOTE — Progress Notes (Signed)
PROGRESS NOTE    Breanna [name]  GDJ:[id number] DOB: 05/14/1931 DOA: 07/20/2020 PCP: Cassandria Anger, MD    Brief Narrative:  86 y.o. female with a past medical history of gout, essential hypertension, glaucoma, history of osteoarthritis, diabetes mellitus type 2 who was in her usual state of health till earlier today when she was with a friend and was watering her plants when the patient made a sudden turn to the right and then lost her balance and fell onto her right side and her arm.  Denies hitting her head against the ground.  Pain is currently 10 out of 10 in intensity.  She is experiencing some muscle spasms as well.  She denies any chest pain shortness of breath.  No nausea vomiting.  She does mention pain in the lower back as well towards the right.  In the emergency department x-ray revealed fracture of the right femoral neck.  She will be hospitalized for further management.  She is noted to be bradycardic.  She is noted to be on beta-blockers.  Assessment & Plan:   Active Problems:   Type 2 diabetes mellitus with renal manifestations, controlled (HCC)   Essential hypertension   Cerebral artery occlusion with cerebral infarction [facility])   Hip fracture (HCC)   Sinus bradycardia  1. Right hip fracture and low back pain:  -orthopedic surgery consulted -Pt now s/p surgery 7/29 -PT/OT post op per orthopedic surgery -Would continue with analgesia as needed,.  Comfortable this morning  2. Sinus bradycardia:  -She is noted to be on carvedilol prior to admit, currently on hold -Her TSH was normal when checked earlier this month.   -Heart rate currently within normal limits vital signs reviewed  3.  Chronic kidney disease stage IIIb:  -Creatinine has risen to over 2 -Continue to monitor urine output.  -Suspect rising creatinine secondary to below anemia -Creatinine improving after blood transfusion  4.  Essential hypertension:  -At home she is on amlodipine,  carvedilol, Cozaar.   -held Cozaar due to elevated creatinine and coreg per above for presenting bradycardia -Blood pressure remains controlled  5. Normocytic anemia: Likely anemia of chronic disease.   -Hemoglobin initially noted to be near baseline prior to surgery -Hemoglobin appropriately corrected after 2 units of PRBCs with hemoglobin of 7.6 this morning -Pete CBC in the morning  6. Thrombocytopenia:  -Platelet count noted to be 98,000 at time of presentation trending down to 60 this morning  7. Diabetes mellitus type 2, with renal complications with chronic kidney disease:  -On Metformin at home which will be held while in hospital. -Continue  SSI.  HBA1C was 6.1 on July 06, 2020.  8. History of gout: Continue allopurinol.  9. Acute blood loss anemia -Suspect secondary to recent surgery. -Patient was made aware of her anemia, agreeable to blood transfusion -Have ordered 2 units of packed red blood cells -Repeat CBC in the morning, continue to transfuse as needed  DVT prophylaxis: SCD's Code Status: Full Family Communication: Pt in room, family not at bedside  Status is: Inpatient  Remains inpatient appropriate because:Ongoing diagnostic testing needed not appropriate for outpatient work up and Unsafe d/c plan   Dispo: The patient is from: Home              Anticipated d/c is to: Pending PT eval              Anticipated d/c date is: 2 days  Patient currently is not medically stable to d/c.   Consultants:   Orthopedic Surgery  Procedures:  Intramedullary nail intertrochantric 7/29  Antimicrobials: Anti-infectives (From admission, onward)   Start     Dose/Rate Route Frequency Ordered Stop   07/21/20 1800  ceFAZolin (ANCEF) IVPB 1 g/50 mL premix        1 g 100 mL/hr over 30 Minutes Intravenous Every 6 hours 07/21/20 1419 07/22/20 0618   07/21/20 1100  ceFAZolin (ANCEF) IVPB 2g/100 mL premix        2 g 200 mL/hr over 30 Minutes Intravenous On  call to O.R. 07/21/20 1004 07/21/20 1200      Subjective: No complaints this morning.  Patient eager to work with therapy  Objective: Vitals:   07/23/20 0020 07/23/20 0050 07/23/20 0329 07/23/20 0348  BP: (!) 111/53 (!) 109/44 (!) 106/42 (!) 116/61  Pulse: 66 64 63 64  Resp: 16 16 17 14   Temp: 99 F (37.2 C) 98.7 F (37.1 C) 98.6 F (37 C) 98.6 F (37 C)  TempSrc: Oral Oral Oral Oral  SpO2:  94%  98%  Weight:      Height:        Intake/Output Summary (Last 24 hours) at 07/23/2020 1429 Last data filed at 07/23/2020 0500 Gross per 24 hour  Intake 1585 ml  Output 200 ml  Net 1385 ml   Filed Weights   07/20/20 2054  Weight: 64.9 kg    Examination: General exam: Conversant, in no acute distress Respiratory system: normal chest rise, clear, no audible wheezing Cardiovascular system: regular rhythm, s1-s2 Gastrointestinal system: Nondistended, nontender, pos BS Central nervous system: No seizures, no tremors Extremities: No cyanosis, no joint deformities Skin: No rashes, no pallor Psychiatry: Affect normal // no auditory hallucinations    Data Reviewed: I have personally reviewed following labs and imaging studies  CBC: Recent Labs  Lab 07/20/20 2101 07/21/20 0446 07/22/20 0801 07/23/20 0802  WBC 6.0 8.3 9.9 12.5*  NEUTROABS 4.4  --   --   --   HGB 9.7* 9.4* 5.9* 7.6*  HCT 30.8* 29.2* 18.5* 23.2*  MCV 90.6 88.2 90.7 85.0  PLT 98* 87* 65* 60*   Basic Metabolic Panel: Recent Labs  Lab 07/20/20 2101 07/21/20 0446 07/22/20 0801 07/23/20 0802  NA 140 141 140 140  K 4.6 4.4 5.3* 4.6  CL 108 109 109 111  CO2 22 23 22  21*  GLUCOSE 158* 160* 132* 128*  BUN 30* 27* 39* 43*  CREATININE 1.63* 1.55* 2.01* 1.95*  CALCIUM 9.2 9.2 8.2* 7.9*   GFR: Estimated Creatinine Clearance: 17.9 mL/min (A) (by C-G formula based on SCr of 1.95 mg/dL (H)). Liver Function Tests: Recent Labs  Lab 07/20/20 2101 07/22/20 0801 07/23/20 0802  AST 23 17 18   ALT 12 8 6    ALKPHOS 81 52 45  BILITOT 0.5 0.1* 0.9  PROT 6.4* 5.0* 5.1*  ALBUMIN 3.4* 2.5* 2.4*   No results for input(s): LIPASE, AMYLASE in the last 168 hours. No results for input(s): AMMONIA in the last 168 hours. Coagulation Profile: No results for input(s): INR, PROTIME in the last 168 hours. Cardiac Enzymes: No results for input(s): CKTOTAL, CKMB, CKMBINDEX, TROPONINI in the last 168 hours. BNP (last 3 results) No results for input(s): PROBNP in the last 8760 hours. HbA1C: No results for input(s): HGBA1C in the last 72 hours. CBG: Recent Labs  Lab 07/22/20 1155 07/22/20 1629 07/22/20 2055 07/23/20 0639 07/23/20 1208  GLUCAP 134* 124* 115* 130* 125*  Lipid Profile: No results for input(s): CHOL, HDL, LDLCALC, TRIG, CHOLHDL, LDLDIRECT in the last 72 hours. Thyroid Function Tests: No results for input(s): TSH, T4TOTAL, FREET4, T3FREE, THYROIDAB in the last 72 hours. Anemia Panel: Recent Labs    07/22/20 1102  TIBC 252  IRON 25*   Sepsis Labs: No results for input(s): PROCALCITON, LATICACIDVEN in the last 168 hours.  Recent Results (from the past 240 hour(s))  SARS Coronavirus 2 by RT PCR (hospital order, performed in [facility] hospital lab) Nasopharyngeal Nasopharyngeal Swab     Status: None   Collection Time: 07/20/20 10:18 PM   Specimen: Nasopharyngeal Swab  Result Value Ref Range Status   SARS Coronavirus 2 NEGATIVE NEGATIVE Final    Comment: (NOTE) SARS-CoV-2 target nucleic acids are NOT DETECTED.  The SARS-CoV-2 RNA is generally detectable in upper and lower respiratory specimens during the acute phase of infection. The lowest concentration of SARS-CoV-2 viral copies this assay can detect is 250 copies / mL. A negative result does not preclude SARS-CoV-2 infection and should not be used as the sole basis for treatment or other patient management decisions.  A negative result may occur with improper specimen collection / handling, submission of specimen  other than nasopharyngeal swab, presence of viral mutation(s) within the areas targeted by this assay, and inadequate number of viral copies (<250 copies / mL). A negative result must be combined with clinical observations, patient history, and epidemiological information.  Fact Sheet for Patients:   StrictlyIdeas.no  Fact Sheet for Healthcare Providers: BankingDealers.co.za  This test is not yet approved or  cleared by the [place] FDA and has been authorized for detection and/or diagnosis of SARS-CoV-2 by FDA under an Emergency Use Authorization (EUA).  This EUA will remain in effect (meaning this test can be used) for the duration of the COVID-19 declaration under Section 564(b)(1) of the Act, 21 U.S.C. section 360bbb-3(b)(1), unless the authorization is terminated or revoked sooner.  Performed at [facility], [place] [address], [place], [place] [address]   Surgical pcr screen     Status: None   Collection Time: 07/21/20  5:36 AM   Specimen: Nasal Mucosa; Nasal Swab  Result Value Ref Range Status   MRSA, PCR NEGATIVE NEGATIVE Final   Staphylococcus aureus NEGATIVE NEGATIVE Final    Comment: (NOTE) The Xpert SA Assay (FDA approved for NASAL specimens in patients 84 years of age and older), is one component of a comprehensive surveillance program. It is not intended to diagnose infection nor to guide or monitor treatment. Performed at [facility], [place] [address], [place], [place] [address]      Radiology Studies: No results found.  Scheduled Meds: . allopurinol  150 mg Oral Daily  . aspirin EC  81 mg Oral Daily  . Chlorhexidine Gluconate Cloth  6 each Topical Daily  . docusate sodium  100 mg Oral BID  . feeding supplement (GLUCERNA SHAKE)  237 mL Oral BID BM  . gabapentin  100 mg Oral TID  . insulin aspart  0-5 Units Subcutaneous QHS  . insulin aspart  0-9 Units Subcutaneous TID WC  . latanoprost  1 drop  Both Eyes QHS  . multivitamin with minerals  1 tablet Oral Daily  . pantoprazole  40 mg Oral Daily  . senna  1 tablet Oral BID  . sertraline  100 mg Oral Daily   Continuous Infusions: . sodium chloride 75 mL/hr at 07/22/20 1742     LOS: 2 days  Breanna Lund, MD [facility] Pager On Amion  If 7PM-7AM, please contact night-coverage 07/23/2020, 2:29 PM

## 2020-07-23 NOTE — Plan of Care (Signed)

## 2020-07-23 NOTE — NC FL2 (Signed)
[place] MEDICAID FL2 LEVEL OF CARE SCREENING TOOL     IDENTIFICATION  Patient Name: Breanna [name] Birthdate: Aug 18, 1931 Sex: female Admission Date (Current Location): 07/20/2020  [facility] and [place] Number:  Herbalist and Address:  [facility], [place] [address] [place], [place] [address]      Provider Number: [id number]  Attending Physician Name and Address:  Donne Hazel, MD  Relative Name and Phone Number:  [name], son, [phone]    Current Level of Care: Hospital Recommended Level of Care: [place] Prior Approval Number:    Date Approved/Denied:   PASRR Number: [id number]  Discharge Plan: SNF    Current Diagnoses: Patient Active Problem List   Diagnosis Date Noted  . Hip fracture ([place]) 07/21/2020  . Sinus bradycardia 07/21/2020  . Hip pain 07/06/2020  . Fall against object 12/25/2018  . Head contusion 12/25/2018  . Acute pain of right knee 09/28/2018  . Well adult exam 05/13/2018  . Herpes labialis 05/13/2018  . Night sweats 04/04/2018  . Breast symptom 10/30/2017  . Diarrhea 08/21/2017  . UTI (urinary tract infection) 08/21/2017  . Lung nodule 08/21/2017  . FTT (failure to thrive) in adult 08/21/2017  . Shortness of breath 08/13/2017  . Cough 08/13/2017  . Fatigue 08/13/2017  . Fever 08/13/2017  . Blurry vision 08/13/2017  . Shoulder pain, right 07/25/2017  . Callus of foot 06/07/2017  . Need for prophylactic vaccination and inoculation against influenza 11/21/2016  . Trigger finger, acquired 11/12/2016  . Meteorism 07/11/2016  . Dark stools 01/10/2016  . Osteoporosis 07/13/2015  . Fall at home 01/13/2015  . Cornea disorder 11/22/2014  . Primary open angle glaucoma of both eyes, indeterminate stage 11/22/2014  . Secondary corneal edema 10/14/2014  . Swelling of left knee joint 04/07/2014  . Grief 01/04/2014  . Syncope 01/04/2014  . Thoracic spine pain 10/22/2013  . Hypernatremia 07/07/2013   . Viral intestinal infection 03/25/2013  . Dehydration, moderate 03/24/2013  . Food poisoning 03/24/2013  . Headache(784.0) 12/22/2012  . URI (upper respiratory infection) 10/21/2012  . Diabetic neuropathy ([place]) 07/30/2012  . Neck pain on right side 07/30/2012  . Cerumen impaction 07/17/2012  . Cystoid macular edema 06/19/2012  . History of corneal transplant 06/19/2012  . Weight loss 04/10/2012  . Wrist pain, right 04/06/2011  . PARESTHESIA 01/03/2011  . FULL INCONTINENCE OF FECES 01/01/2011  . ABDOMINAL PAIN RIGHT UPPER QUADRANT 01/01/2011  . LEG PAIN, LEFT 10/02/2010  . Cerebral artery occlusion with cerebral infarction ([place]) 03/27/2010  . GERD 03/27/2010  . DIVERTICULOSIS-COLON 03/27/2010  . CHOLELITHIASIS 03/27/2010  . LOW BACK PAIN 09/07/2009  . ANEMIA, IRON DEFICIENCY, HX OF 06/14/2009  . DYSPHAGIA 05/16/2009  . Dysphagia 05/16/2009  . Dyslipidemia 11/06/2007  . Type 2 diabetes mellitus with renal manifestations, controlled ([place]) 07/29/2007  . Gout 07/29/2007  . Essential hypertension 07/29/2007  . HYDRONEPHROSIS 07/29/2007  . Osteoarthritis 07/29/2007  . COLONIC POLYPS, HX OF 07/29/2007  . DIVERTICULITIS, HX OF 07/29/2007    Orientation RESPIRATION BLADDER Height & Weight     Self, Time, Situation, Place  Normal Continent Weight: 143 lb (64.9 kg) Height:  5\' 4"  (162.6 cm)  BEHAVIORAL SYMPTOMS/MOOD NEUROLOGICAL BOWEL NUTRITION STATUS      Continent Diet (Please see DC Summary)  AMBULATORY STATUS COMMUNICATION OF NEEDS Skin   Extensive Assist Verbally Surgical wounds (Closed incision on hip)  Personal Care Assistance Level of Assistance  Bathing, Feeding, Dressing Bathing Assistance: Maximum assistance Feeding assistance: Independent Dressing Assistance: Limited assistance     Functional Limitations Info             SPECIAL CARE FACTORS FREQUENCY  PT (By licensed PT), OT (By licensed OT)     PT Frequency: 5x/week OT  Frequency: 5x/week            Contractures Contractures Info: Not present    Additional Factors Info  Code Status, Allergies, Psychotropic, Insulin Sliding Scale Code Status Info: Full Allergies Info: Verapamil, Aspirin, Atenolol, Codeine, Codeine Sulfate, Hydrochlorothiazide, Hydrocodone, Ibuprofen, Lisinopril, Onion, Shellfish Allergy, Valsartan, Adhesive  (Tape) Psychotropic Info: Zoloft Insulin Sliding Scale Info: See DC Summary for dose       Current Medications (07/23/2020):  This is the current hospital active medication list Current Facility-Administered Medications  Medication Dose Route Frequency Provider Last Rate Last Admin  . 0.9 %  sodium chloride infusion   Intravenous Continuous Donne Hazel, MD 75 mL/hr at 07/22/20 1742 New Bag at 07/22/20 1742  . acetaminophen (TYLENOL) tablet 650 mg  650 mg Oral Q6H PRN Donne Hazel, MD   650 mg at 07/22/20 2202  . allopurinol (ZYLOPRIM) tablet 150 mg  150 mg Oral Daily Bonnielee Haff, MD   150 mg at 07/23/20 0819  . aspirin EC tablet 81 mg  81 mg Oral Daily Bonnielee Haff, MD   81 mg at 07/23/20 8144  . Chlorhexidine Gluconate Cloth 2 % PADS 6 each  6 each Topical Daily Lisette Abu, PA-C   6 each at 07/23/20 8185  . docusate sodium (COLACE) capsule 100 mg  100 mg Oral BID Margy Clarks M, PA-C   100 mg at 07/23/20 6314  . feeding supplement (GLUCERNA SHAKE) (GLUCERNA SHAKE) liquid 237 mL  237 mL Oral BID BM Donne Hazel, MD      . gabapentin (NEURONTIN) capsule 100 mg  100 mg Oral TID Bonnielee Haff, MD   100 mg at 07/23/20 0820  . hydrALAZINE (APRESOLINE) injection 5 mg  5 mg Intravenous Q6H PRN Bonnielee Haff, MD      . insulin aspart (novoLOG) injection 0-5 Units  0-5 Units Subcutaneous QHS Donne Hazel, MD      . insulin aspart (novoLOG) injection 0-9 Units  0-9 Units Subcutaneous TID WC Donne Hazel, MD   1 Units at 07/23/20 0818  . latanoprost (XALATAN) 0.005 % ophthalmic solution 1 drop  1 drop  Both Eyes QHS Bonnielee Haff, MD   1 drop at 07/22/20 2205  . metoCLOPramide (REGLAN) tablet 5-10 mg  5-10 mg Oral Q8H PRN Margy Clarks M, PA-C       Or  . metoCLOPramide (REGLAN) injection 5-10 mg  5-10 mg Intravenous Q8H PRN Margy Clarks M, PA-C      . morphine 2 MG/ML injection 0.5 mg  0.5 mg Intravenous Q2H PRN Bonnielee Haff, MD   0.5 mg at 07/21/20 0034  . multivitamin with minerals tablet 1 tablet  1 tablet Oral Daily Donne Hazel, MD   1 tablet at 07/23/20 0818  . ondansetron (ZOFRAN) tablet 4 mg  4 mg Oral Q6H PRN Margy Clarks M, PA-C       Or  . ondansetron [facility]) injection 4 mg  4 mg Intravenous Q6H PRN Margy Clarks M, PA-C      . oxyCODONE (Oxy IR/ROXICODONE) immediate release tablet 5-10 mg  5-10 mg Oral Q4H PRN Bonnielee Haff,  MD   10 mg at 07/21/20 0502  . pantoprazole (PROTONIX) EC tablet 40 mg  40 mg Oral Daily Bonnielee Haff, MD   40 mg at 07/23/20 0819  . phenol (CHLORASEPTIC) mouth spray 1 spray  1 spray Mouth/Throat PRN Donne Hazel, MD   1 spray at 07/21/20 1845  . polyethylene glycol (MIRALAX / GLYCOLAX) packet 17 g  17 g Oral Daily PRN Bonnielee Haff, MD      . senna (SENOKOT) tablet 8.6 mg  1 tablet Oral BID Bonnielee Haff, MD   8.6 mg at 07/23/20 0819  . sertraline (ZOLOFT) tablet 100 mg  100 mg Oral Daily Bonnielee Haff, MD   100 mg at 07/23/20 4967     Discharge Medications: Please see discharge summary for a list of discharge medications.  Relevant Imaging Results:  Relevant Lab Results:   Additional Information SSN: Halsey Holton, [place]

## 2020-07-23 NOTE — TOC Initial Note (Signed)
Transition of Care [facility]) - Initial/Assessment Note    Patient Details  Name: Breanna [name] MRN: [id number] Date of Birth: [birth year]  Transition of Care [facility]) CM/SW Contact:    Benard Halsted, LCSW Phone Number: 07/23/2020, 10:28 AM  Clinical Narrative:                 CSW received consult for possible SNF placement at time of discharge. CSW spoke with patient regarding PT recommendation of SNF placement at time of discharge. Patient reported that patient's family is currently unable to care for patient at her home given patient's current physical needs as they are only in town visiting. Patient expressed understanding of PT recommendation and is agreeable to SNF placement at time of discharge. CSW discussed insurance authorization process and provided Medicare SNF ratings list and will present bed offers as available. She has received both COVID vaccines and will be able to have visitors at [facility]. Patient expressed being hopeful for rehab and to feel better soon. No further questions reported at this time. CSW to continue to follow and assist with discharge planning needs.   Expected Discharge Plan: Skilled Nursing Facility Barriers to Discharge: Continued Medical Work up, SNF Pending bed offer, Insurance Authorization   Patient Goals and CMS Choice Patient states their goals for this hospitalization and ongoing recovery are:: Rehab CMS Medicare.gov Compare Post Acute Care list provided to:: Patient Choice offered to / list presented to : Patient  Expected Discharge Plan and Services Expected Discharge Plan: [place] In-house Referral: Clinical Social Work   Post Acute Care Choice: [place] Living arrangements for the past 2 months: [place]                 DME Arranged: N/A                    Prior Living Arrangements/Services Living arrangements for the past 2 months: Single Family Home Lives with:: Self Patient language and need  for interpreter reviewed:: Yes Do you feel safe going back to the place where you live?: Yes      Need for Family Participation in Patient Care: Yes (Comment) Care giver support system in place?: No (comment)   Criminal Activity/Legal Involvement Pertinent to Current Situation/Hospitalization: No - Comment as needed  Activities of Daily Living Home Assistive Devices/Equipment: None ADL Screening (condition at time of admission) Patient's cognitive ability adequate to safely complete daily activities?: Yes Is the patient deaf or have difficulty hearing?: No Does the patient have difficulty seeing, even when wearing glasses/contacts?: Yes Does the patient have difficulty concentrating, remembering, or making decisions?: No Patient able to express need for assistance with ADLs?: Yes Does the patient have difficulty dressing or bathing?: No Independently performs ADLs?: Yes (appropriate for developmental age) Does the patient have difficulty walking or climbing stairs?: Yes Weakness of Legs: Right Weakness of Arms/Hands: None  Permission Sought/Granted Permission sought to share information with : Facility Art therapist granted to share information with : Yes, Verbal Permission Granted     Permission granted to share info w AGENCY: SNFs        Emotional Assessment Appearance:: Appears stated age Attitude/Demeanor/Rapport: Gracious Affect (typically observed): Accepting, Appropriate Orientation: : Oriented to Self, Oriented to Place, Oriented to  Time, Oriented to Situation Alcohol / Substance Use: Not Applicable Psych Involvement: No (comment)  Admission diagnosis:  Low back pain [M54.5] Hip fracture (HCC) [S72.009A] Trauma [T14.90XA] Sinus bradycardia [R00.1] Fall, initial  encounter [id number]] Closed fracture of right hip, initial encounter [facility]) [S72.001A] Patient Active Problem List   Diagnosis Date Noted  . Hip fracture ([place]) 07/21/2020  . Sinus  bradycardia 07/21/2020  . Hip pain 07/06/2020  . Fall against object 12/25/2018  . Head contusion 12/25/2018  . Acute pain of right knee 09/28/2018  . Well adult exam 05/13/2018  . Herpes labialis 05/13/2018  . Night sweats 04/04/2018  . Breast symptom 10/30/2017  . Diarrhea 08/21/2017  . UTI (urinary tract infection) 08/21/2017  . Lung nodule 08/21/2017  . FTT (failure to thrive) in adult 08/21/2017  . Shortness of breath 08/13/2017  . Cough 08/13/2017  . Fatigue 08/13/2017  . Fever 08/13/2017  . Blurry vision 08/13/2017  . Shoulder pain, right 07/25/2017  . Callus of foot 06/07/2017  . Need for prophylactic vaccination and inoculation against influenza 11/21/2016  . Trigger finger, acquired 11/12/2016  . Meteorism 07/11/2016  . Dark stools 01/10/2016  . Osteoporosis 07/13/2015  . Fall at home 01/13/2015  . Cornea disorder 11/22/2014  . Primary open angle glaucoma of both eyes, indeterminate stage 11/22/2014  . Secondary corneal edema 10/14/2014  . Swelling of left knee joint 04/07/2014  . Grief 01/04/2014  . Syncope 01/04/2014  . Thoracic spine pain 10/22/2013  . Hypernatremia 07/07/2013  . Viral intestinal infection 03/25/2013  . Dehydration, moderate 03/24/2013  . Food poisoning 03/24/2013  . Headache(784.0) 12/22/2012  . URI (upper respiratory infection) 10/21/2012  . Diabetic neuropathy ([place]) 07/30/2012  . Neck pain on right side 07/30/2012  . Cerumen impaction 07/17/2012  . Cystoid macular edema 06/19/2012  . History of corneal transplant 06/19/2012  . Weight loss 04/10/2012  . Wrist pain, right 04/06/2011  . PARESTHESIA 01/03/2011  . FULL INCONTINENCE OF FECES 01/01/2011  . ABDOMINAL PAIN RIGHT UPPER QUADRANT 01/01/2011  . LEG PAIN, LEFT 10/02/2010  . Cerebral artery occlusion with cerebral infarction ([place]) 03/27/2010  . GERD 03/27/2010  . DIVERTICULOSIS-COLON 03/27/2010  . CHOLELITHIASIS 03/27/2010  . LOW BACK PAIN 09/07/2009  . ANEMIA, IRON DEFICIENCY,  HX OF 06/14/2009  . DYSPHAGIA 05/16/2009  . Dysphagia 05/16/2009  . Dyslipidemia 11/06/2007  . Type 2 diabetes mellitus with renal manifestations, controlled ([place]) 07/29/2007  . Gout 07/29/2007  . Essential hypertension 07/29/2007  . HYDRONEPHROSIS 07/29/2007  . Osteoarthritis 07/29/2007  . COLONIC POLYPS, HX OF 07/29/2007  . DIVERTICULITIS, HX OF 07/29/2007   PCP:  Cassandria Anger, MD Pharmacy:   [id number] [address], [place] [address] Phone: [phone] Fax: [phone]     Social Determinants of Health (SDOH) Interventions    Readmission Risk Interventions No flowsheet data found.

## 2020-07-23 NOTE — Progress Notes (Signed)
SPORTS MEDICINE AND JOINT REPLACEMENT  Lara Mulch, MD    Carlyon Shadow, PA-C [place], [place], [place]  [address]                             [phone]   PROGRESS NOTE  Subjective:  negative for Chest Pain  negative for Shortness of Breath  negative for Nausea/Vomiting   negative for Calf Pain  negative for Bowel Movement   Tolerating Diet: yes         Patient reports pain as 2 on 0-10 scale.    Objective: Vital signs in last 24 hours:    Patient Vitals for the past 24 hrs:  BP Temp Temp src Pulse Resp SpO2  07/23/20 0348 (!) 116/61 98.6 F (37 C) Oral 64 14 98 %  07/23/20 0329 (!) 106/42 98.6 F (37 C) Oral 63 17 --  07/23/20 0050 (!) 109/44 98.7 F (37.1 C) Oral 64 16 94 %  07/23/20 0020 (!) 111/53 99 F (37.2 C) Oral 66 16 --  07/22/20 2107 (!) 120/56 99.5 F (37.5 C) Oral 67 -- --  07/22/20 2055 (!) 120/56 99.5 F (37.5 C) -- 67 18 --  07/22/20 2019 (!) 110/51 99.7 F (37.6 C) Oral 72 18 100 %  07/22/20 1931 (!) 120/47 99.8 F (37.7 C) Oral 68 16 95 %  07/22/20 1626 (!) 132/42 -- -- 69 -- 97 %  07/22/20 1534 (!) 116/55 99 F (37.2 C) Oral 66 18 96 %  07/22/20 0737 (!) 118/51 98.5 F (36.9 C) Oral 65 16 100 %    @flow {1959:LAST@   Intake/Output from previous day:   07/30 0701 - 07/31 0700 In: 2331.8 [P.O.:340; I.V.:731.8] Out: 200 [Urine:200]   Intake/Output this shift:   07/30 1901 - 07/31 0700 In: 5400 [P.O.:100; I.V.:225] Out: 200 [Urine:200]   Intake/Output      07/30 0701 - 07/31 0700   P.O. 340   I.V. (mL/kg) 731.8 (11.3)   Blood 1260   Total Intake(mL/kg) 2331.8 (35.9)   Urine (mL/kg/hr) 200 (0.1)   Total Output 200   Net +2131.8       Urine Occurrence 1 x      LABORATORY DATA: Recent Labs    07/20/20 2101 07/21/20 0446 07/22/20 0801  WBC 6.0 8.3 9.9  HGB 9.7* 9.4* 5.9*  HCT 30.8* 29.2* 18.5*  PLT 98* 87* 65*   Recent Labs    07/20/20 2101 07/21/20 0446 07/22/20 0801  NA 140 141 140  K 4.6 4.4 5.3*   CL 108 109 109  CO2 22 23 22   BUN 30* 27* 39*  CREATININE 1.63* 1.55* 2.01*  GLUCOSE 158* 160* 132*  CALCIUM 9.2 9.2 8.2*   No results found for: INR, PROTIME  Examination:  General appearance: alert, cooperative and no distress Extremities: extremities normal, atraumatic, no cyanosis or edema  Wound Exam: clean, dry, intact   Drainage:  None: wound tissue dry  Motor Exam: Quadriceps and Hamstrings Intact  Sensory Exam: Superficial Peroneal, Deep Peroneal and Tibial normal   Assessment:    2 Days Post-Op  Procedure(s) (LRB): INTRAMEDULLARY (IM) NAIL INTERTROCHANTRIC (Right)  ADDITIONAL DIAGNOSIS:  Active Problems:   Type 2 diabetes mellitus with renal manifestations, controlled (HCC)   Essential hypertension   Cerebral artery occlusion with cerebral infarction (HCC)   Hip fracture (HCC)   Sinus bradycardia     Plan:  Weightbearing: WBAT to RLE Insicional and dressing  care: Dressings left intact until follow-up Showering: Keep dressing dry VTE prophylaxis: ASA once anemia resolved, SCDs, ambulation Pain control: Minimize narcotics.  Continue current regimen. Follow - up plan: 2 weeks Contact information:  Edmonia Lynch MD, Roxan Hockey PA-C  Dispo: TBD. Transfusion yesterday, no new H/H today. Therapy evaluations pending.  Discharge when mobilized and ready medically.  Donia Ast 07/23/2020, 6:33 AM

## 2020-07-23 NOTE — Progress Notes (Signed)
Completed blood transfusion( 2units PRBC) per order. No blood transfusion reactions or adverse reactions noted. Remains alert , verbal and oriented x 4 with no apparent distress. Will continue to monitor status.

## 2020-07-23 NOTE — Plan of Care (Signed)

## 2020-07-24 DIAGNOSIS — I6350 Cerebral infarction due to unspecified occlusion or stenosis of unspecified cerebral artery: Secondary | ICD-10-CM

## 2020-07-24 LAB — CBC
HCT: 21.8 % — ABNORMAL LOW (ref 36.0–46.0)
Hemoglobin: 7.2 g/dL — ABNORMAL LOW (ref 12.0–15.0)
MCH: 28.6 pg (ref 26.0–34.0)
MCHC: 33 g/dL (ref 30.0–36.0)
MCV: 86.5 fL (ref 80.0–100.0)
Platelets: 64 10*3/uL — ABNORMAL LOW (ref 150–400)
RBC: 2.52 MIL/uL — ABNORMAL LOW (ref 3.87–5.11)
RDW: 17 % — ABNORMAL HIGH (ref 11.5–15.5)
WBC: 10.2 10*3/uL (ref 4.0–10.5)
nRBC: 1.7 % — ABNORMAL HIGH (ref 0.0–0.2)

## 2020-07-24 LAB — COMPREHENSIVE METABOLIC PANEL
ALT: <5 U/L (ref 0–44)
AST: 17 U/L (ref 15–41)
Albumin: 2.3 g/dL — ABNORMAL LOW (ref 3.5–5.0)
Alkaline Phosphatase: 49 U/L (ref 38–126)
Anion gap: 6 (ref 5–15)
BUN: 35 mg/dL — ABNORMAL HIGH (ref 8–23)
CO2: 22 mmol/L (ref 22–32)
Calcium: 8.1 mg/dL — ABNORMAL LOW (ref 8.9–10.3)
Chloride: 111 mmol/L (ref 98–111)
Creatinine, Ser: 1.5 mg/dL — ABNORMAL HIGH (ref 0.44–1.00)
GFR calc Af Amer: 36 mL/min — ABNORMAL LOW (ref >60)
GFR calc non Af Amer: 31 mL/min — ABNORMAL LOW (ref >60)
Glucose, Bld: 111 mg/dL — ABNORMAL HIGH (ref 70–99)
Potassium: 4.3 mmol/L (ref 3.5–5.1)
Sodium: 139 mmol/L (ref 135–145)
Total Bilirubin: 0.3 mg/dL (ref 0.3–1.2)
Total Protein: 4.9 g/dL — ABNORMAL LOW (ref 6.5–8.1)

## 2020-07-24 LAB — BPAM RBC
Blood Product Expiration Date: 202108072359
Blood Product Expiration Date: 202108082359
ISSUE DATE / TIME: 202107302029
ISSUE DATE / TIME: 202107310029
Unit Type and Rh: 5100
Unit Type and Rh: 5100

## 2020-07-24 LAB — GLUCOSE, CAPILLARY
Glucose-Capillary: 105 mg/dL — ABNORMAL HIGH (ref 70–99)
Glucose-Capillary: 143 mg/dL — ABNORMAL HIGH (ref 70–99)
Glucose-Capillary: 107 mg/dL — ABNORMAL HIGH (ref 70–99)
Glucose-Capillary: 149 mg/dL — ABNORMAL HIGH (ref 70–99)

## 2020-07-24 LAB — TYPE AND SCREEN
ABO/RH(D): AB POS
Antibody Screen: POSITIVE
DAT, IgG: NEGATIVE
Donor AG Type: NEGATIVE
Donor AG Type: NEGATIVE
Unit division: 0
Unit division: 0

## 2020-07-24 NOTE — Evaluation (Signed)
Occupational Therapy Evaluation Patient Details Name: Breanna [name] MRN: [id number] DOB: 22-Jul-1931 Today's Date: 07/24/2020    History of Present Illness 86 y.o. female with a past medical history of gout, essential hypertension, glaucoma, osteoarthritis, diabetes mellitus type 2, CVA, L TKR, R total shoulder, adrenal adenoma admitted to ED on 7/29 after fall, sustaining R hip femoral neck fracture. S/p R hip IM nail on 7/29.   Clinical Impression   PTA, pt was living at home alone, she was independent with ADL/IADL and utilized meals on wheels. Pt was modified independent for functional mobility with use of a cane. Pt currently highly motivated to work with therapy this date. She required minA with HOB elevated to progress to EOB. She attempted to stand x4 from lower surface bed, but due to BLE weakness and RLE pain she required maxA with elevated surface to progress into standing. Pt required minA to pivot to recliner at RW level and by pivoting feet. Due to decline in current level of function, pt would benefit from acute OT to address established goals to facilitate safe D/C to venue listed below. At this time, recommend SNF follow-up. Will continue to follow acutely.     Follow Up Recommendations  SNF;Supervision/Assistance - 24 hour    Equipment Recommendations  3 in 1 bedside commode    Recommendations for Other Services       Precautions / Restrictions Precautions Precautions: Fall Restrictions Weight Bearing Restrictions: No RLE Weight Bearing: Weight bearing as tolerated      Mobility Bed Mobility Overal bed mobility: Needs Assistance Bed Mobility: Supine to Sit     Supine to sit: Min assist;HOB elevated     General bed mobility comments: minA for RLE management  Transfers Overall transfer level: Needs assistance Equipment used: Rolling walker (2 wheeled) Transfers: Sit to/from [facility] Sit to Stand: Max assist;From elevated surface Stand  pivot transfers: Min assist       General transfer comment: maxA to powerup from elevated surface;minA to pivot to recliner on left side of pt    Balance Overall balance assessment: Needs assistance Sitting-balance support: Bilateral upper extremity supported;Feet unsupported Sitting balance-Leahy Scale: Fair Sitting balance - Comments: able to sit EOB unsuported once at EOB     Standing balance-Leahy Scale: Poor Standing balance comment: heavy reliance on BUE support                           ADL either performed or assessed with clinical judgement   ADL Overall ADL's : Needs assistance/impaired Eating/Feeding: Set up;Sitting   Grooming: Set up;Sitting   Upper Body Bathing: Set up;Sitting   Lower Body Bathing: Sit to/from stand;Maximal assistance   Upper Body Dressing : Set up;Sitting   Lower Body Dressing: Maximal assistance;Sit to/from stand   Toilet Transfer: Maximal assistance;Stand-pivot;RW Toilet Transfer Details (indicate cue type and reason): maxA to stand from EOB, minA to standpivot to recliner Toileting- Clothing Manipulation and Hygiene: Maximal assistance;Sit to/from stand Toileting - Clothing Manipulation Details (indicate cue type and reason): maxA to stand from elevated surface, pt reliant on BUE support     Functional mobility during ADLs: Minimal assistance;Maximal assistance;Rolling walker General ADL Comments: pt required supervision for completion of ADL sitting EOB;she required maxA to stand from elevated surface and minA to pivot to recliner     Vision Baseline Vision/History: Wears glasses Patient Visual Report: No change from baseline       Perception  Praxis      Pertinent Vitals/Pain Pain Assessment: 0-10 Pain Score: 5  Pain Location: R hip Pain Descriptors / Indicators: Sore;Discomfort;Aching;Grimacing;Guarding Pain Intervention(s): Limited activity within patient's tolerance;Monitored during session;Repositioned      Hand Dominance Right   Extremity/Trunk Assessment Upper Extremity Assessment Upper Extremity Assessment: Generalized weakness   Lower Extremity Assessment Lower Extremity Assessment: Defer to PT evaluation;Generalized weakness;RLE deficits/detail RLE Deficits / Details: consistent wtih post-op RLE: Unable to fully assess due to pain RLE Sensation: WNL   Cervical / Trunk Assessment Cervical / Trunk Assessment: Normal   Communication Communication Communication: No difficulties   Cognition Arousal/Alertness: Awake/alert Behavior During Therapy: WFL for tasks assessed/performed Overall Cognitive Status: Within Functional Limits for tasks assessed                                 General Comments: pt highly motivated to work with therapy this date   General Comments  HR 84-87 bpm     Exercises     Shoulder Instructions      Home Living Family/patient expects to be discharged to:: Private residence Living Arrangements: Alone Available Help at Discharge: Family;Friend(s);Available PRN/intermittently Type of Home: House Home Access: Stairs to enter [employer] of Steps: 5 Entrance Stairs-Rails: Right;Left Home Layout: One level     Bathroom Shower/Tub: Occupational psychologist: Standard     Home Equipment: Cane - single point;Walker - 4 wheels          Prior Functioning/Environment Level of Independence: Independent with assistive device(s)        Comments: using cane for ambulation PTA, occasionally using RW        OT Problem List: Decreased strength;Decreased range of motion;Decreased activity tolerance;Impaired balance (sitting and/or standing);Decreased safety awareness;Decreased knowledge of use of DME or AE;Decreased knowledge of precautions;Pain      OT Treatment/Interventions: Self-care/ADL training;Therapeutic exercise;Energy conservation;DME and/or AE instruction;Therapeutic activities;Patient/family  education;Balance training    OT Goals(Current goals can be found in the care plan section) Acute Rehab OT Goals Patient Stated Goal: get stronger at rehab OT Goal Formulation: With patient Time For Goal Achievement: 08/07/20 Potential to Achieve Goals: Good ADL Goals Pt Will Perform Lower Body Dressing: with modified independence;sit to/from stand;with adaptive equipment Pt Will Transfer to Toilet: with supervision;ambulating Additional ADL Goal #1: Pt will complete bed mobility with modified independence in preparation for ADL/IADL and functional mobility.  OT Frequency: Min 2X/week   Barriers to D/C:            Co-evaluation              AM-PAC OT "6 Clicks" Daily Activity     Outcome Measure Help from another person eating meals?: A Little Help from another person taking care of personal grooming?: A Little Help from another person toileting, which includes using toliet, bedpan, or urinal?: A Lot Help from another person bathing (including washing, rinsing, drying)?: A Lot Help from another person to put on and taking off regular upper body clothing?: A Little Help from another person to put on and taking off regular lower body clothing?: A Lot 6 Click Score: 15   End of Session Equipment Utilized During Treatment: Gait belt;Rolling walker Nurse Communication: Mobility status  Activity Tolerance: Patient tolerated treatment well Patient left: in chair;with call bell/phone within reach;with chair alarm set  OT Visit Diagnosis: Unsteadiness on feet (R26.81);Other abnormalities of gait and mobility (R26.89);Muscle weakness (generalized) (  M62.81);Pain Pain - Right/Left: Right Pain - part of body: Hip;Leg                Time: 2244-9753 OT Time Calculation (min): 29 min Charges:  OT General Charges $OT Visit: 1 Visit OT Evaluation $OT Eval Moderate Complexity: 1 Mod OT Treatments $Self Care/Home Management : 8-22 mins  Helene Kelp OTR/L Acute Rehabilitation  Services Office: [phone]   Wyn Forster 07/24/2020, 1:50 PM

## 2020-07-24 NOTE — Plan of Care (Signed)

## 2020-07-24 NOTE — Progress Notes (Signed)
PROGRESS NOTE    Breanna [name]  NIO:[id number] DOB: 1931/12/19 DOA: 07/20/2020 PCP: Cassandria Anger, MD    Brief Narrative:  86 y.o. female with a past medical history of gout, essential hypertension, glaucoma, history of osteoarthritis, diabetes mellitus type 2 who was in her usual state of health till earlier today when she was with a friend and was watering her plants when the patient made a sudden turn to the right and then lost her balance and fell onto her right side and her arm.  Denies hitting her head against the ground.  Pain is currently 10 out of 10 in intensity.  She is experiencing some muscle spasms as well.  She denies any chest pain shortness of breath.  No nausea vomiting.  She does mention pain in the lower back as well towards the right.  In the emergency department x-ray revealed fracture of the right femoral neck.  She will be hospitalized for further management.  She is noted to be bradycardic.  She is noted to be on beta-blockers.  Assessment & Plan:   Active Problems:   Type 2 diabetes mellitus with renal manifestations, controlled (HCC)   Essential hypertension   Cerebral artery occlusion with cerebral infarction [facility])   Hip fracture (HCC)   Sinus bradycardia  1. Right hip fracture and low back pain:  -orthopedic surgery consulted -Pt now s/p surgery 7/29 -PT/OT post op per orthopedic surgery -Would continue with analgesia as needed,.  Patient appears comfortable this morning  2. Sinus bradycardia:  -She is noted to be on carvedilol prior to admit, currently on hold -Her TSH was normal when checked earlier this month.   -Heart rate currently within normal limits vital signs reviewed  3.  Chronic kidney disease stage IIIb:  -Creatinine has risen to over 2 -Continue to monitor urine output.  -Suspect rising creatinine secondary to below anemia -Creatinine has improved after blood transfusion  4.  Essential hypertension:  -At home she is on  amlodipine, carvedilol, Cozaar.   -held Cozaar due to elevated creatinine and coreg per above for presenting bradycardia -Blood pressure stable at present  5. Normocytic anemia: Likely anemia of chronic disease.   -Hemoglobin initially noted to be near baseline prior to surgery -Hemoglobin appropriately corrected after 2 units of PRBCs with hemoglobin of 7.6 this morning -Recheck CBC in the morning  6. Thrombocytopenia:  -Platelet count low of 60, now trending up  7. Diabetes mellitus type 2, with renal complications with chronic kidney disease:  -On Metformin at home which will be held while in hospital. -Continue  SSI.  HBA1C was 6.1 on July 06, 2020.  8. History of gout: Continue allopurinol as tolerated.  9. Acute blood loss anemia -Suspect secondary to recent surgery. -Patient was made aware of her anemia, agreeable to blood transfusion -Have ordered 2 units of packed red blood cells -Recheck CBC in the morning, continue to transfuse as needed  DVT prophylaxis: SCD's Code Status: Full Family Communication: Pt in room, family not at bedside  Status is: Inpatient  Remains inpatient appropriate because:Ongoing diagnostic testing needed not appropriate for outpatient work up and Unsafe d/c plan   Dispo: The patient is from: Home              Anticipated d/c is to: Pending PT eval              Anticipated d/c date is: 2 days  Patient currently is not medically stable to d/c.   Consultants:   Orthopedic Surgery  Procedures:  Intramedullary nail intertrochantric 7/29  Antimicrobials: Anti-infectives (From admission, onward)   Start     Dose/Rate Route Frequency Ordered Stop   07/21/20 1800  ceFAZolin (ANCEF) IVPB 1 g/50 mL premix        1 g 100 mL/hr over 30 Minutes Intravenous Every 6 hours 07/21/20 1419 07/22/20 0618   07/21/20 1100  ceFAZolin (ANCEF) IVPB 2g/100 mL premix        2 g 200 mL/hr over 30 Minutes Intravenous On call to O.R. 07/21/20  1004 07/21/20 1200      Subjective: Patient with no complaints this morning.  Eager to start therapy  Objective: Vitals:   07/23/20 1530 07/23/20 1959 07/24/20 0300 07/24/20 0943  BP: (!) 119/53 (!) 123/61 (!) 128/64 (!) 145/68  Pulse: 64 61 64 74  Resp: 19 16 16 18   Temp: 99 F (37.2 C) 98.6 F (37 C) 98.5 F (36.9 C) 99.3 F (37.4 C)  TempSrc: Oral Oral Oral Oral  SpO2: 98% 99% 97% 96%  Weight:      Height:        Intake/Output Summary (Last 24 hours) at 07/24/2020 1446 Last data filed at 07/24/2020 0600 Gross per 24 hour  Intake 585.25 ml  Output 900 ml  Net -314.75 ml   Filed Weights   07/20/20 2054  Weight: 64.9 kg    Examination: General exam: Awake, laying in bed, in nad Respiratory system: Normal respiratory effort, no wheezing Cardiovascular system: regular rate, s1, s2 Gastrointestinal system: Soft, nondistended, positive BS Central nervous system: CN2-12 grossly intact, strength intact Extremities: Perfused, no clubbing Skin: Normal skin turgor, no notable skin lesions seen Psychiatry: Mood normal // no visual hallucinations   Data Reviewed: I have personally reviewed following labs and imaging studies  CBC: Recent Labs  Lab 07/20/20 2101 07/21/20 0446 07/22/20 0801 07/23/20 0802 07/24/20 0349  WBC 6.0 8.3 9.9 12.5* 10.2  NEUTROABS 4.4  --   --   --   --   HGB 9.7* 9.4* 5.9* 7.6* 7.2*  HCT 30.8* 29.2* 18.5* 23.2* 21.8*  MCV 90.6 88.2 90.7 85.0 86.5  PLT 98* 87* 65* 60* 64*   Basic Metabolic Panel: Recent Labs  Lab 07/20/20 2101 07/21/20 0446 07/22/20 0801 07/23/20 0802 07/24/20 0349  NA 140 141 140 140 139  K 4.6 4.4 5.3* 4.6 4.3  CL 108 109 109 111 111  CO2 22 23 22  21* 22  GLUCOSE 158* 160* 132* 128* 111*  BUN 30* 27* 39* 43* 35*  CREATININE 1.63* 1.55* 2.01* 1.95* 1.50*  CALCIUM 9.2 9.2 8.2* 7.9* 8.1*   GFR: Estimated Creatinine Clearance: 23.2 mL/min (A) (by C-G formula based on SCr of 1.5 mg/dL (H)). Liver Function  Tests: Recent Labs  Lab 07/20/20 2101 07/22/20 0801 07/23/20 0802 07/24/20 0349  AST 23 17 18 17   ALT 12 8 6  <5  ALKPHOS 81 52 45 49  BILITOT 0.5 0.1* 0.9 0.3  PROT 6.4* 5.0* 5.1* 4.9*  ALBUMIN 3.4* 2.5* 2.4* 2.3*   No results for input(s): LIPASE, AMYLASE in the last 168 hours. No results for input(s): AMMONIA in the last 168 hours. Coagulation Profile: No results for input(s): INR, PROTIME in the last 168 hours. Cardiac Enzymes: No results for input(s): CKTOTAL, CKMB, CKMBINDEX, TROPONINI in the last 168 hours. BNP (last 3 results) No results for input(s): PROBNP in the last 8760 hours. HbA1C: No results  for input(s): HGBA1C in the last 72 hours. CBG: Recent Labs  Lab 07/23/20 1208 07/23/20 1710 07/23/20 2025 07/24/20 0651 07/24/20 1123  GLUCAP 125* 115* 129* 105* 143*   Lipid Profile: No results for input(s): CHOL, HDL, LDLCALC, TRIG, CHOLHDL, LDLDIRECT in the last 72 hours. Thyroid Function Tests: No results for input(s): TSH, T4TOTAL, FREET4, T3FREE, THYROIDAB in the last 72 hours. Anemia Panel: Recent Labs    07/22/20 1102  TIBC 252  IRON 25*   Sepsis Labs: No results for input(s): PROCALCITON, LATICACIDVEN in the last 168 hours.  Recent Results (from the past 240 hour(s))  SARS Coronavirus 2 by RT PCR (hospital order, performed in [facility] hospital lab) Nasopharyngeal Nasopharyngeal Swab     Status: None   Collection Time: 07/20/20 10:18 PM   Specimen: Nasopharyngeal Swab  Result Value Ref Range Status   SARS Coronavirus 2 NEGATIVE NEGATIVE Final    Comment: (NOTE) SARS-CoV-2 target nucleic acids are NOT DETECTED.  The SARS-CoV-2 RNA is generally detectable in upper and lower respiratory specimens during the acute phase of infection. The lowest concentration of SARS-CoV-2 viral copies this assay can detect is 250 copies / mL. A negative result does not preclude SARS-CoV-2 infection and should not be used as the sole basis for treatment or  other patient management decisions.  A negative result may occur with improper specimen collection / handling, submission of specimen other than nasopharyngeal swab, presence of viral mutation(s) within the areas targeted by this assay, and inadequate number of viral copies (<250 copies / mL). A negative result must be combined with clinical observations, patient history, and epidemiological information.  Fact Sheet for Patients:   StrictlyIdeas.no  Fact Sheet for Healthcare Providers: BankingDealers.co.za  This test is not yet approved or  cleared by the [place] FDA and has been authorized for detection and/or diagnosis of SARS-CoV-2 by FDA under an Emergency Use Authorization (EUA).  This EUA will remain in effect (meaning this test can be used) for the duration of the COVID-19 declaration under Section 564(b)(1) of the Act, 21 U.S.C. section 360bbb-3(b)(1), unless the authorization is terminated or revoked sooner.  Performed at [facility], [place] [address] [place], [place] [address]   Surgical pcr screen     Status: None   Collection Time: 07/21/20  5:36 AM   Specimen: Nasal Mucosa; Nasal Swab  Result Value Ref Range Status   MRSA, PCR NEGATIVE NEGATIVE Final   Staphylococcus aureus NEGATIVE NEGATIVE Final    Comment: (NOTE) The Xpert SA Assay (FDA approved for NASAL specimens in patients 56 years of age and older), is one component of a comprehensive surveillance program. It is not intended to diagnose infection nor to guide or monitor treatment. Performed at [facility], [place] [address] [place], [place] [address]      Radiology Studies: No results found.  Scheduled Meds: . allopurinol  150 mg Oral Daily  . aspirin EC  81 mg Oral Daily  . Chlorhexidine Gluconate Cloth  6 each Topical Daily  . docusate sodium  100 mg Oral BID  . feeding supplement (GLUCERNA SHAKE)  237 mL Oral BID BM  . gabapentin   100 mg Oral TID  . insulin aspart  0-5 Units Subcutaneous QHS  . insulin aspart  0-9 Units Subcutaneous TID WC  . latanoprost  1 drop Both Eyes QHS  . multivitamin with minerals  1 tablet Oral Daily  . pantoprazole  40 mg Oral Daily  . senna  1 tablet Oral BID  . sertraline  100 mg Oral Daily   Continuous Infusions: . sodium chloride Stopped (07/23/20 1021)     LOS: 3 days   Marylu Lund, MD [facility] Pager On Amion  If 7PM-7AM, please contact night-coverage 07/24/2020, 2:46 PM

## 2020-07-24 NOTE — Progress Notes (Signed)
SPORTS MEDICINE AND JOINT REPLACEMENT  Lara Mulch, MD    Carlyon Shadow, PA-C [place], [place], [place]  [address]                             [phone]   PROGRESS NOTE  Subjective:  negative for Chest Pain  negative for Shortness of Breath  negative for Nausea/Vomiting   negative for Calf Pain  negative for Bowel Movement   Tolerating Diet: yes         Patient reports pain as 3 on 0-10 scale.    Objective: Vital signs in last 24 hours:    Patient Vitals for the past 24 hrs:  BP Temp Temp src Pulse Resp SpO2  07/24/20 0300 (!) 128/64 98.5 F (36.9 C) Oral 64 16 97 %  07/23/20 1959 (!) 123/61 98.6 F (37 C) Oral 61 16 99 %  07/23/20 1530 (!) 119/53 99 F (37.2 C) Oral 64 19 98 %    @flow {1959:LAST@   Intake/Output from previous day:   07/31 0701 - 08/01 0700 In: 585.3 [P.O.:120; I.V.:465.3] Out: 1700 [Urine:1700]   Intake/Output this shift:   07/31 1901 - 08/01 0700 In: 120 [P.O.:120] Out: 900 [Urine:900]   Intake/Output      07/31 0701 - 08/01 0700   P.O. 120   I.V. (mL/kg) 465.3 (7.2)   Total Intake(mL/kg) 585.3 (9)   Urine (mL/kg/hr) 1700 (1.1)   Total Output 1700   Net -1114.8          LABORATORY DATA: Recent Labs    07/20/20 2101 07/21/20 0446 07/22/20 0801 07/23/20 0802 07/24/20 0349  WBC 6.0 8.3 9.9 12.5* 10.2  HGB 9.7* 9.4* 5.9* 7.6* 7.2*  HCT 30.8* 29.2* 18.5* 23.2* 21.8*  PLT 98* 87* 65* 60* 64*   Recent Labs    07/20/20 2101 07/21/20 0446 07/22/20 0801 07/23/20 0802 07/24/20 0349  NA 140 141 140 140 139  K 4.6 4.4 5.3* 4.6 4.3  CL 108 109 109 111 111  CO2 22 23 22  21* 22  BUN 30* 27* 39* 43* 35*  CREATININE 1.63* 1.55* 2.01* 1.95* 1.50*  GLUCOSE 158* 160* 132* 128* 111*  CALCIUM 9.2 9.2 8.2* 7.9* 8.1*   No results found for: INR, PROTIME  Examination:  General appearance: alert, cooperative and no distress Extremities: extremities normal, atraumatic, no cyanosis or edema  Wound Exam: clean, dry,  intact   Drainage:  None: wound tissue dry  Motor Exam: Quadriceps and Hamstrings Intact  Sensory Exam: Superficial Peroneal, Deep Peroneal and Tibial normal   Assessment:    3 Days Post-Op  Procedure(s) (LRB): INTRAMEDULLARY (IM) NAIL INTERTROCHANTRIC (Right)  ADDITIONAL DIAGNOSIS:  Active Problems:   Type 2 diabetes mellitus with renal manifestations, controlled (HCC)   Essential hypertension   Cerebral artery occlusion with cerebral infarction (HCC)   Hip fracture (HCC)   Sinus bradycardia     Plan:  Weightbearing:WBAT to RLE Insicional and dressing care: Dressings left intact until follow-up Showering: Keep dressing dry VTE prophylaxis:ASA onceanemia resolved, SCDs, ambulation Pain control: Minimize narcotics. Continue current regimen. Follow - up plan: 2 weeks   Dispo: TBD. Transfusion friday, HGB 7.6 yesterday. Goal for patient to get up with therapy today. Discharge when mobilized and ready medically.  Donia Ast 07/24/2020, 6:43 AM

## 2020-07-24 NOTE — Progress Notes (Signed)
ANTICOAGULATION CONSULT NOTE  Labs: Recent Labs    07/22/20 0801 07/22/20 0801 07/23/20 0802 07/24/20 0349  HGB 5.9*   < > 7.6* 7.2*  HCT 18.5*  --  23.2* 21.8*  PLT 65*  --  60* 64*  CREATININE 2.01*  --  1.95* 1.50*   < > = values in this interval not displayed.    Assessment: 86 year old female presented to ED 7/29 with right femoral neck fracture. Pharmacy consulted for restart of PTA direct oral anticoagulant (DOAC) on POD1 hip fracture. Patient not on anticoagulation PTA, only on SCDs in patient prior to procedure.  Patient anemic on 7/30, Hgb 5.9 and received 2 units PRBC 7/31. Hgb slowly rising to 7.2 this morning. Will continue to monitor to start VTE prophylaxis post-op with ASA; patient currently on low-dose ASA 81 mg.  Goal of Therapy:  VTE prevention Monitor platelets by anticoagulation protocol: Yes   Plan:  - Monitor CBC and F/U ortho recs to start ASA for VTE ppx.    Dannika Hilgeman L. Devin Going, [place] PGY2 Pharmacy Resident [phone] 07/24/20     7:50 AM  Please check AMION for all [place] phone numbers After 10:00 PM, call the [place] [phone]

## 2020-07-25 ENCOUNTER — Ambulatory Visit: Payer: Medicare PPO

## 2020-07-25 LAB — CBC
HCT: 20.5 % — ABNORMAL LOW (ref 36.0–46.0)
Hemoglobin: 6.7 g/dL — CL (ref 12.0–15.0)
MCH: 28.9 pg (ref 26.0–34.0)
MCHC: 32.7 g/dL (ref 30.0–36.0)
MCV: 88.4 fL (ref 80.0–100.0)
Platelets: 83 10*3/uL — ABNORMAL LOW (ref 150–400)
RBC: 2.32 MIL/uL — ABNORMAL LOW (ref 3.87–5.11)
RDW: 16.4 % — ABNORMAL HIGH (ref 11.5–15.5)
WBC: 9 10*3/uL (ref 4.0–10.5)
nRBC: 4.5 % — ABNORMAL HIGH (ref 0.0–0.2)

## 2020-07-25 LAB — COMPREHENSIVE METABOLIC PANEL
ALT: 6 U/L (ref 0–44)
AST: 17 U/L (ref 15–41)
Albumin: 2.2 g/dL — ABNORMAL LOW (ref 3.5–5.0)
Alkaline Phosphatase: 46 U/L (ref 38–126)
Anion gap: 8 (ref 5–15)
BUN: 26 mg/dL — ABNORMAL HIGH (ref 8–23)
CO2: 23 mmol/L (ref 22–32)
Calcium: 8.4 mg/dL — ABNORMAL LOW (ref 8.9–10.3)
Chloride: 108 mmol/L (ref 98–111)
Creatinine, Ser: 1.28 mg/dL — ABNORMAL HIGH (ref 0.44–1.00)
GFR calc Af Amer: 44 mL/min — ABNORMAL LOW (ref >60)
GFR calc non Af Amer: 38 mL/min — ABNORMAL LOW (ref >60)
Glucose, Bld: 108 mg/dL — ABNORMAL HIGH (ref 70–99)
Potassium: 4.5 mmol/L (ref 3.5–5.1)
Sodium: 139 mmol/L (ref 135–145)
Total Bilirubin: 0.5 mg/dL (ref 0.3–1.2)
Total Protein: 4.8 g/dL — ABNORMAL LOW (ref 6.5–8.1)

## 2020-07-25 LAB — GLUCOSE, CAPILLARY
Glucose-Capillary: 97 mg/dL (ref 70–99)
Glucose-Capillary: 110 mg/dL — ABNORMAL HIGH (ref 70–99)
Glucose-Capillary: 140 mg/dL — ABNORMAL HIGH (ref 70–99)
Glucose-Capillary: 124 mg/dL — ABNORMAL HIGH (ref 70–99)

## 2020-07-25 LAB — PREPARE RBC (CROSSMATCH)

## 2020-07-25 MED ORDER — ASPIRIN EC 81 MG PO TBEC
81.0000 mg | DELAYED_RELEASE_TABLET | Freq: Two times a day (BID) | ORAL | Status: DC
  Administered 2020-07-25 – 2020-07-29 (×8): 81 mg via ORAL
  Filled 2020-07-25 (×7): qty 1

## 2020-07-25 MED ORDER — SODIUM CHLORIDE 0.9% IV SOLUTION: Freq: Once | INTRAVENOUS | Status: AC

## 2020-07-25 NOTE — Progress Notes (Signed)
ANTICOAGULATION CONSULT NOTE  Labs: Recent Labs    07/23/20 0802 07/23/20 0802 07/24/20 0349 07/25/20 0753  HGB 7.6*   < > 7.2* 6.7*  HCT 23.2*  --  21.8* 20.5*  PLT 60*  --  64* 83*  CREATININE 1.95*  --  1.50* 1.28*   < > = values in this interval not displayed.    Assessment: 86 year old female presented to ED 7/29 with right femoral neck fracture. Pharmacy consulted for restart of PTA direct oral anticoagulant (DOAC) on POD1 hip fracture but patient is not on any DOAC PTA.  Patient continues to require blood transfusions for H/H drop. Spoke with Dr. Percell Miller regarding consult and plan for anticoagulation. He would like aspirin 81mg  BID and ok to start today despite H/H drop (now s/p 2 units RBC). Will order discontinue consult.   Goal of Therapy:  VTE prevention Monitor platelets by anticoagulation protocol: Yes   Plan:  Increase aspirin to 81mg  BID per Ortho, informed TRH Will sign off on consult   Benetta Spar, PharmD, BCPS, BCCP Clinical Pharmacist  Please check AMION for all [place] phone numbers After 10:00 PM, call [place] [phone]

## 2020-07-25 NOTE — Progress Notes (Signed)
CRITICAL VALUE ALERT  Critical Value:  hgb 6.7  Date & Time Notied: 07/25/20 @0835   Provider Notified: Dr. Wyline Copas Orders Received/Actions taken: 2 units prbc

## 2020-07-25 NOTE — TOC Progression Note (Addendum)
Transition of Care [facility]) - Progression Note    Patient Details  Name: Breanna [name] MRN: [id number] Date of Birth: 1931-11-25  Transition of Care [facility]) CM/SW Contact  Curlene Labrum, RN Phone Number: 07/25/2020, 12:41 PM  Clinical Narrative:     Case management met with the patient and son in the room and the son chose [facility] for admission; however, when I called to start insurance authorization - the facility is not accepting any patient's this week due to a COVID outbreak.  I called the son and mentioned this and I will meet with the son this afternoon to discuss another choice that is in network with the patient's insurance.  8/2, 1548 - Case management spoke with the son and noted that [facility] in [place] had accepted the patient in the hub - Called the son and offered an additional choice to the family regarding [place] admission.  The son sounded agreeable and [facility] was called to confirm bed availability.  [place] and insurance authorization started - starting tomorrow considering patient continues to be followed for lower hemoglobin levels.  Clinicals faxed to [facility].  Patient will need a repeat COVID.  Expected Discharge Plan: [place] Barriers to Discharge: Continued Medical Work up, SNF Pending bed offer, Ship broker  Expected Discharge Plan and Services Expected Discharge Plan: [place] In-house Referral: Clinical Social Work   Post Acute Care Choice: [place] Living arrangements for the past 2 months: Single Family Home                 DME Arranged: N/A                     Social Determinants of Health (SDOH) Interventions    Readmission Risk Interventions No flowsheet data found.

## 2020-07-25 NOTE — Progress Notes (Addendum)
Physical Therapy Treatment Patient Details Name: Breanna [name] MRN: [id number] DOB: 08-Jul-1931 Today's Date: 07/25/2020    History of Present Illness 86 y.o. female with a past medical history of gout, essential hypertension, glaucoma, osteoarthritis, diabetes mellitus type 2, CVA, L TKR, R total shoulder, adrenal adenoma admitted to ED on 7/29 after fall, sustaining R hip femoral neck fracture. S/p R hip IM nail on 7/29.    PT Comments    Patient received in bed, agrees to PT. Motivated to get moving. She wants to do as much as she can by herself. Requires min assist for rolling and supine to sit. Mod assist to return to supine from sitting. She attempted standing with RW and +1 assist but was unable to clear bottom from bed despite bed being elevated. She was able to scoot up to head of bed in seated position on the edge of bed. She will continue to benefit from skilled PT while here to improve functional independence and strength.      Follow Up Recommendations  SNF;Supervision/Assistance - 24 hour     Equipment Recommendations  None recommended by PT    Recommendations for Other Services       Precautions / Restrictions Precautions Precautions: Fall Restrictions Weight Bearing Restrictions: Yes RLE Weight Bearing: Weight bearing as tolerated    Mobility  Bed Mobility Overal bed mobility: Needs Assistance Bed Mobility: Supine to Sit;Sit to Supine     Supine to sit: Min assist;HOB elevated Sit to supine: Mod assist;HOB elevated   General bed mobility comments: minA for RLE management  Transfers Overall transfer level: Needs assistance Equipment used: Rolling walker (2 wheeled) Transfers: Sit to/from Stand Sit to Stand: Max assist Stand pivot transfers: From elevated surface       General transfer comment: Unable to power up to standing with +1 assist and bed elevated. Attempted several times, could not completely clear bottom  Ambulation/Gait              General Gait Details: unable   Stairs             Wheelchair Mobility    Modified Rankin (Stroke Patients Only)       Balance Overall balance assessment: Needs assistance Sitting-balance support: Feet supported;Single extremity supported Sitting balance-Leahy Scale: Fair Sitting balance - Comments: able to sit EOB unsuported once at EOB   Standing balance support: Bilateral upper extremity supported;During functional activity Standing balance-Leahy Scale: Poor                              Cognition Arousal/Alertness: Awake/alert Behavior During Therapy: WFL for tasks assessed/performed Overall Cognitive Status: Within Functional Limits for tasks assessed                                 General Comments: pt highly motivated to work with therapy this date      Exercises Total Joint Exercises Ankle Circles/Pumps: AROM;10 reps;Both Heel Slides: AAROM;Right;10 reps Hip ABduction/ADduction: AAROM;Right;10 reps    General Comments        Pertinent Vitals/Pain Pain Assessment: Faces Faces Pain Scale: Hurts even more Pain Location: R hip Pain Descriptors / Indicators: Grimacing;Guarding;Discomfort;Sore Pain Intervention(s): Monitored during session;Limited activity within patient's tolerance;Repositioned;Patient requesting pain meds-RN notified    Home Living  Prior Function            PT Goals (current goals can now be found in the care plan section) Acute Rehab PT Goals Patient Stated Goal: get stronger at rehab PT Goal Formulation: With patient Time For Goal Achievement: 08/05/20 Potential to Achieve Goals: Good Progress towards PT goals: Progressing toward goals    Frequency    Min 3X/week      PT Plan Current plan remains appropriate    Co-evaluation              AM-PAC PT "6 Clicks" Mobility   Outcome Measure  Help needed turning from your back to your side while in a flat  bed without using bedrails?: A Little Help needed moving from lying on your back to sitting on the side of a flat bed without using bedrails?: A Little Help needed moving to and from a bed to a chair (including a wheelchair)?: Total Help needed standing up from a chair using your arms (e.g., wheelchair or bedside chair)?: Total Help needed to walk in hospital room?: Total Help needed climbing 3-5 steps with a railing? : Total 6 Click Score: 10    End of Session Equipment Utilized During Treatment: Gait belt Activity Tolerance: Patient tolerated treatment well;Patient limited by pain Patient left: in bed;with call bell/phone within reach;with bed alarm set Nurse Communication: Mobility status PT Visit Diagnosis: Other abnormalities of gait and mobility (R26.89);Muscle weakness (generalized) (M62.81);Difficulty in walking, not elsewhere classified (R26.2);Pain Pain - Right/Left: Right Pain - part of body: Hip     Time: 9728-2060 PT Time Calculation (min) (ACUTE ONLY): 29 min  Charges:  $Gait Training: 8-22 mins $Therapeutic Exercise: 8-22 mins $Therapeutic Activity: 8-22 mins                     Jayana Kotula, PT, GCS 07/25/20,3:06 PM

## 2020-07-25 NOTE — Progress Notes (Signed)
PROGRESS NOTE    Breanna [name]  OZD:[id number] DOB: Mar 13, 1931 DOA: 07/20/2020 PCP: Cassandria Anger, MD    Brief Narrative:  86 y.o. female with a past medical history of gout, essential hypertension, glaucoma, history of osteoarthritis, diabetes mellitus type 2 who was in her usual state of health till earlier today when she was with a friend and was watering her plants when the patient made a sudden turn to the right and then lost her balance and fell onto her right side and her arm.  Denies hitting her head against the ground.  Pain is currently 10 out of 10 in intensity.  She is experiencing some muscle spasms as well.  She denies any chest pain shortness of breath.  No nausea vomiting.  She does mention pain in the lower back as well towards the right.  In the emergency department x-ray revealed fracture of the right femoral neck.  She will be hospitalized for further management.  She is noted to be bradycardic.  She is noted to be on beta-blockers.  Assessment & Plan:   Active Problems:   Type 2 diabetes mellitus with renal manifestations, controlled (HCC)   Essential hypertension   Cerebral artery occlusion with cerebral infarction [facility])   Hip fracture (HCC)   Sinus bradycardia  1. Right hip fracture and low back pain:  -orthopedic surgery consulted -Pt now s/p surgery 7/29 -PT/OT post op per orthopedic surgery -Would continue with analgesia as needed,.  Patient appears comfortable this morning  2. Sinus bradycardia:  -She is noted to be on carvedilol prior to admit, currently on hold -Her TSH was normal when checked earlier this month.   -Heart rate currently within normal limits vital signs reviewed, remains stable  3.  Chronic kidney disease stage IIIb:  -Creatinine has risen to over 2 -Continue to monitor urine output.  -Cr now steadily improving -Repeat bmet in AM  4.  Essential hypertension:  -At home she is on amlodipine, carvedilol, Cozaar.   -held  Cozaar due to elevated creatinine and coreg per above for presenting bradycardia -Blood pressure currently stable  5. Normocytic anemia with acute blood loss anemia: Likely anemia of chronic disease.   -Hemoglobin initially noted to be near baseline prior to surgery -Recently required 2 units of PRBC's pos-operatively -This AM, hgb of 6.7. Have ordered 2 more units PRBC's -Recheck CBC in the morning  6. Thrombocytopenia:  -Platelet count with a low of 60 -Plts now trending up, currently 83  7. Diabetes mellitus type 2, with renal complications with chronic kidney disease:  -On Metformin at home which will be held while in hospital. -Continue  SSI.  HBA1C was 6.1 on July 06, 2020.  8. History of gout: Continue allopurinol as tolerated.  9. Acute blood loss anemia -Suspect secondary to recent surgery. -Patient was made aware of her anemia, agreeable to blood transfusion -See above. Given 2 units earlier in this course -Will give an additional 2 units for hgb of 6.7 -Repeat cbc in AM  DVT prophylaxis: SCD's Code Status: Full Family Communication: Pt in room, family not at bedside  Status is: Inpatient  Remains inpatient appropriate because:Ongoing diagnostic testing needed not appropriate for outpatient work up and Unsafe d/c plan   Dispo: The patient is from: Home              Anticipated d/c is to: Pending PT eval              Anticipated d/c date is:  2 days              Patient currently is not medically stable to d/c.   Consultants:   Orthopedic Surgery  Procedures:  Intramedullary nail intertrochantric 7/29  Antimicrobials: Anti-infectives (From admission, onward)   Start     Dose/Rate Route Frequency Ordered Stop   07/21/20 1800  ceFAZolin (ANCEF) IVPB 1 g/50 mL premix        1 g 100 mL/hr over 30 Minutes Intravenous Every 6 hours 07/21/20 1419 07/22/20 0618   07/21/20 1100  ceFAZolin (ANCEF) IVPB 2g/100 mL premix        2 g 200 mL/hr over 30 Minutes  Intravenous On call to O.R. 07/21/20 1004 07/21/20 1200      Subjective: No complaints this AM  Objective: Vitals:   07/25/20 0302 07/25/20 0934 07/25/20 1445 07/25/20 1515  BP: (!) 132/57 (!) 126/61 (!) 137/57 (!) 146/77  Pulse: 69 66 65 71  Resp: 18 18 18 18   Temp: 98.5 F (36.9 C) 98.8 F (37.1 C) 100.1 F (37.8 C) 99.6 F (37.6 C)  TempSrc: Oral Oral Oral Oral  SpO2: 98% 100% 100% 100%  Weight:      Height:        Intake/Output Summary (Last 24 hours) at 07/25/2020 1608 Last data filed at 07/25/2020 1520 Gross per 24 hour  Intake 925 ml  Output 600 ml  Net 325 ml   Filed Weights   07/20/20 2054  Weight: 64.9 kg    Examination: General exam: Conversant, in no acute distress Respiratory system: normal chest rise, clear, no audible wheezing Cardiovascular system: regular rhythm, s1-s2 Gastrointestinal system: Nondistended, nontender, pos BS Central nervous system: No seizures, no tremors Extremities: No cyanosis, no joint deformities Skin: No rashes, no pallor Psychiatry: Affect normal // no auditory hallucinations   Data Reviewed: I have personally reviewed following labs and imaging studies  CBC: Recent Labs  Lab 07/20/20 2101 07/20/20 2101 07/21/20 0446 07/22/20 0801 07/23/20 0802 07/24/20 0349 07/25/20 0753  WBC 6.0   < > 8.3 9.9 12.5* 10.2 9.0  NEUTROABS 4.4  --   --   --   --   --   --   HGB 9.7*   < > 9.4* 5.9* 7.6* 7.2* 6.7*  HCT 30.8*   < > 29.2* 18.5* 23.2* 21.8* 20.5*  MCV 90.6   < > 88.2 90.7 85.0 86.5 88.4  PLT 98*   < > 87* 65* 60* 64* 83*   < > = values in this interval not displayed.   Basic Metabolic Panel: Recent Labs  Lab 07/21/20 0446 07/22/20 0801 07/23/20 0802 07/24/20 0349 07/25/20 0753  NA 141 140 140 139 139  K 4.4 5.3* 4.6 4.3 4.5  CL 109 109 111 111 108  CO2 23 22 21* 22 23  GLUCOSE 160* 132* 128* 111* 108*  BUN 27* 39* 43* 35* 26*  CREATININE 1.55* 2.01* 1.95* 1.50* 1.28*  CALCIUM 9.2 8.2* 7.9* 8.1* 8.4*    GFR: Estimated Creatinine Clearance: 27.2 mL/min (A) (by C-G formula based on SCr of 1.28 mg/dL (H)). Liver Function Tests: Recent Labs  Lab 07/20/20 2101 07/22/20 0801 07/23/20 0802 07/24/20 0349 07/25/20 0753  AST 23 17 18 17 17   ALT 12 8 6  <5 6  ALKPHOS 81 52 45 49 46  BILITOT 0.5 0.1* 0.9 0.3 0.5  PROT 6.4* 5.0* 5.1* 4.9* 4.8*  ALBUMIN 3.4* 2.5* 2.4* 2.3* 2.2*   No results for input(s): LIPASE, AMYLASE in  the last 168 hours. No results for input(s): AMMONIA in the last 168 hours. Coagulation Profile: No results for input(s): INR, PROTIME in the last 168 hours. Cardiac Enzymes: No results for input(s): CKTOTAL, CKMB, CKMBINDEX, TROPONINI in the last 168 hours. BNP (last 3 results) No results for input(s): PROBNP in the last 8760 hours. HbA1C: No results for input(s): HGBA1C in the last 72 hours. CBG: Recent Labs  Lab 07/24/20 1123 07/24/20 1639 07/24/20 2028 07/25/20 0642 07/25/20 1136  GLUCAP 143* 107* 149* 97 110*   Lipid Profile: No results for input(s): CHOL, HDL, LDLCALC, TRIG, CHOLHDL, LDLDIRECT in the last 72 hours. Thyroid Function Tests: No results for input(s): TSH, T4TOTAL, FREET4, T3FREE, THYROIDAB in the last 72 hours. Anemia Panel: No results for input(s): VITAMINB12, FOLATE, FERRITIN, TIBC, IRON, RETICCTPCT in the last 72 hours. Sepsis Labs: No results for input(s): PROCALCITON, LATICACIDVEN in the last 168 hours.  Recent Results (from the past 240 hour(s))  SARS Coronavirus 2 by RT PCR (hospital order, performed in [facility] [place] hospital lab) Nasopharyngeal Nasopharyngeal Swab     Status: None   Collection Time: 07/20/20 10:18 PM   Specimen: Nasopharyngeal Swab  Result Value Ref Range Status   SARS Coronavirus 2 NEGATIVE NEGATIVE Final    Comment: (NOTE) SARS-CoV-2 target nucleic acids are NOT DETECTED.  The SARS-CoV-2 RNA is generally detectable in upper and lower respiratory specimens during the acute phase of infection. The  lowest concentration of SARS-CoV-2 viral copies this assay can detect is 250 copies / mL. A negative result does not preclude SARS-CoV-2 infection and should not be used as the sole basis for treatment or other patient management decisions.  A negative result may occur with improper specimen collection / handling, submission of specimen other than nasopharyngeal swab, presence of viral mutation(s) within the areas targeted by this assay, and inadequate number of viral copies (<250 copies / mL). A negative result must be combined with clinical observations, patient history, and epidemiological information.  Fact Sheet for Patients:   StrictlyIdeas.no  Fact Sheet for Healthcare Providers: BankingDealers.co.za  This test is not yet approved or  cleared by the [place] FDA and has been authorized for detection and/or diagnosis of SARS-CoV-2 by FDA under an Emergency Use Authorization (EUA).  This EUA will remain in effect (meaning this test can be used) for the duration of the COVID-19 declaration under Section 564(b)(1) of the Act, 21 U.S.C. section 360bbb-3(b)(1), unless the authorization is terminated or revoked sooner.  Performed at [facility], [place] [address] [place], [place] [address]   Surgical pcr screen     Status: None   Collection Time: 07/21/20  5:36 AM   Specimen: Nasal Mucosa; Nasal Swab  Result Value Ref Range Status   MRSA, PCR NEGATIVE NEGATIVE Final   Staphylococcus aureus NEGATIVE NEGATIVE Final    Comment: (NOTE) The Xpert SA Assay (FDA approved for NASAL specimens in patients 70 years of age and older), is one component of a comprehensive surveillance program. It is not intended to diagnose infection nor to guide or monitor treatment. Performed at [facility], [place] [address] [place], [place] [address]      Radiology Studies: No results found.  Scheduled Meds: . allopurinol  150 mg Oral  Daily  . aspirin EC  81 mg Oral BID  . Chlorhexidine Gluconate Cloth  6 each Topical Daily  . docusate sodium  100 mg Oral BID  . feeding supplement (GLUCERNA SHAKE)  237 mL Oral  BID BM  . gabapentin  100 mg Oral TID  . insulin aspart  0-5 Units Subcutaneous QHS  . insulin aspart  0-9 Units Subcutaneous TID WC  . latanoprost  1 drop Both Eyes QHS  . multivitamin with minerals  1 tablet Oral Daily  . pantoprazole  40 mg Oral Daily  . senna  1 tablet Oral BID  . sertraline  100 mg Oral Daily   Continuous Infusions: . sodium chloride Stopped (07/23/20 1021)     LOS: 4 days   Marylu Lund, MD [facility] Pager On Amion  If 7PM-7AM, please contact night-coverage 07/25/2020, 4:08 PM

## 2020-07-25 NOTE — Plan of Care (Signed)
  Problem: Education: Goal: Knowledge of General Education information will improve Description: Including pain rating scale, medication(s)/side effects and non-pharmacologic comfort measures Outcome: Progressing   Problem: Health Behavior/Discharge Planning: Goal: Ability to manage health-related needs will improve Outcome: Progressing   Problem: Clinical Measurements: Goal: Will remain free from infection Outcome: Progressing   Problem: Activity: Goal: Risk for activity intolerance will decrease Outcome: Progressing   Problem: Nutrition: Goal: Adequate nutrition will be maintained Outcome: Progressing   Problem: Elimination: Goal: Will not experience complications related to bowel motility Outcome: Progressing   Problem: Pain Managment: Goal: General experience of comfort will improve Outcome: Progressing   

## 2020-07-25 NOTE — Progress Notes (Signed)
PT Cancellation Note  Patient Details Name: Breanna [name] MRN: [id number] DOB: 1931/06/09   Cancelled Treatment:    Reason Eval/Treat Not Completed: Medical issues which prohibited therapy.  Patient has hgb of 6.7 this morning and will be getting transfusion. Will re-attempt later today.     [name] 07/25/2020, 9:59 AM

## 2020-07-26 ENCOUNTER — Inpatient Hospital Stay (HOSPITAL_COMMUNITY): Payer: Medicare PPO

## 2020-07-26 LAB — GLUCOSE, CAPILLARY
Glucose-Capillary: 88 mg/dL (ref 70–99)
Glucose-Capillary: 88 mg/dL (ref 70–99)
Glucose-Capillary: 151 mg/dL — ABNORMAL HIGH (ref 70–99)
Glucose-Capillary: 121 mg/dL — ABNORMAL HIGH (ref 70–99)
Glucose-Capillary: 134 mg/dL — ABNORMAL HIGH (ref 70–99)

## 2020-07-26 LAB — BPAM RBC
Blood Product Expiration Date: 202108242359
Blood Product Expiration Date: 202108252359
ISSUE DATE / TIME: 202108021446
ISSUE DATE / TIME: 202108021824
Unit Type and Rh: 6200
Unit Type and Rh: 6200

## 2020-07-26 LAB — URINALYSIS, ROUTINE W REFLEX MICROSCOPIC
Bacteria, UA: NONE SEEN
Bilirubin Urine: NEGATIVE
Glucose, UA: NEGATIVE mg/dL
Hgb urine dipstick: NEGATIVE
Ketones, ur: NEGATIVE mg/dL
Leukocytes,Ua: NEGATIVE
Nitrite: NEGATIVE
Protein, ur: 30 mg/dL — AB
Specific Gravity, Urine: 1.012 (ref 1.005–1.030)
pH: 6 (ref 5.0–8.0)

## 2020-07-26 LAB — CBC
HCT: 26.1 % — ABNORMAL LOW (ref 36.0–46.0)
Hemoglobin: 8.7 g/dL — ABNORMAL LOW (ref 12.0–15.0)
MCH: 29.1 pg (ref 26.0–34.0)
MCHC: 33.3 g/dL (ref 30.0–36.0)
MCV: 87.3 fL (ref 80.0–100.0)
Platelets: 80 10*3/uL — ABNORMAL LOW (ref 150–400)
RBC: 2.99 MIL/uL — ABNORMAL LOW (ref 3.87–5.11)
RDW: 15.2 % (ref 11.5–15.5)
WBC: 8.2 10*3/uL (ref 4.0–10.5)
nRBC: 3.4 % — ABNORMAL HIGH (ref 0.0–0.2)

## 2020-07-26 LAB — TYPE AND SCREEN
ABO/RH(D): AB POS
Antibody Screen: POSITIVE
Donor AG Type: NEGATIVE
Donor AG Type: NEGATIVE
Unit division: 0
Unit division: 0

## 2020-07-26 LAB — SARS CORONAVIRUS 2 BY RT PCR (HOSPITAL ORDER, PERFORMED IN ~~LOC~~ HOSPITAL LAB): SARS Coronavirus 2: NEGATIVE

## 2020-07-26 MED ORDER — AMLODIPINE BESYLATE 10 MG PO TABS
10.0000 mg | ORAL_TABLET | Freq: Every day | ORAL | Status: DC
  Administered 2020-07-26 – 2020-07-29 (×4): 10 mg via ORAL
  Filled 2020-07-26 (×4): qty 1

## 2020-07-26 NOTE — Plan of Care (Signed)
  Problem: Education: Goal: Knowledge of General Education information will improve Description: Including pain rating scale, medication(s)/side effects and non-pharmacologic comfort measures Outcome: Progressing   Problem: Health Behavior/Discharge Planning: Goal: Ability to manage health-related needs will improve Outcome: Progressing   Problem: Clinical Measurements: Goal: Will remain free from infection Outcome: Progressing   Problem: Activity: Goal: Risk for activity intolerance will decrease Outcome: Progressing   Problem: Pain Managment: Goal: General experience of comfort will improve Outcome: Progressing   Problem: Safety: Goal: Ability to remain free from injury will improve Outcome: Progressing   

## 2020-07-26 NOTE — Progress Notes (Signed)
Attending MD  Has been informed that pt is having a temp of 100.7, PRN tylenol given, Seen pt in room, alert/oriented in no apparent distress, Feels warm to touch, coverings/blankets removed..Closely monitored.

## 2020-07-26 NOTE — Progress Notes (Signed)
Nutrition Follow-up  RD working remotely.  DOCUMENTATION CODES:   Not applicable  INTERVENTION:   -D/c Glucerna shake -Continue MVI with minerals daily -Magic cup BID with meals, each supplement provides 290 kcal and 9 grams of protein  NUTRITION DIAGNOSIS:   Increased nutrient needs related to post-op healing as evidenced by estimated needs.  Ongoing  GOAL:   Patient will meet greater than or equal to 90% of their needs  Progressing   MONITOR:   PO intake, Supplement acceptance, Diet advancement, Labs, Weight trends, Skin, I & O's  REASON FOR ASSESSMENT:   Consult Hip fracture protocol  ASSESSMENT:   Breanna [name] is a 86 y.o. female with a past medical history of gout, essential hypertension, glaucoma, history of osteoarthritis, diabetes mellitus type 2 who was in her usual state of health till earlier today when she was with a friend and was watering her plants when the patient made a sudden turn to the right and then lost her balance and fell onto her right side and her arm  7/29- s/p PROCEDURE:  INTRAMEDULLARY (IM) NAIL INTERTROCHANTRIC  Reviewed I/O's: +2.1 L x 24 hours and +1.8 L since admission  UOP: 203 ml x 24 hours  Attempted to speak with pt via hospital room phone, however, no answer.   Pt with good appetite; noted meal completion 75-100%. Pt is refusing Glucerna supplements.   Medications reviewed and include senna and 0.9% sodium chloride infusion @ 75 ml/hr.   Per [facility] team notes, pt awaiting insurance authorization for SNF.   Labs reviewed: CBGS: 88-124 (inpatient orders for glycemic control are 0-5 units insulin aspart daily at bedtime, 0-9 units insulin aspart TID with meals, ).   Diet Order:   Diet Order            Diet regular Room service appropriate? Yes with Assist; Fluid consistency: Thin  Diet effective now                 EDUCATION NEEDS:   No education needs have been identified at this time  Skin:  Skin Assessment:  Skin Integrity Issues: Skin Integrity Issues:: Incisions Incisions: closed rt hip  Last BM:  07/25/20  Height:   Ht Readings from Last 1 Encounters:  07/20/20 5\' 4"  (1.626 m)    Weight:   Wt Readings from Last 1 Encounters:  07/20/20 64.9 kg    Ideal Body Weight:  54.5 kg  BMI:  Body mass index is 24.55 kg/m.  Estimated Nutritional Needs:   Kcal:  1600-1800  Protein:  80-95 grams  Fluid:  > 1.6 L    Loistine Chance, RD, LDN, [place] Registered Dietitian II Certified Diabetes Care and Education Specialist Please refer to [facility] for RD and/or RD on-call/weekend/after hours pager

## 2020-07-26 NOTE — Progress Notes (Signed)
PROGRESS NOTE    Breanna [name]  IDP:[id number] DOB: 1931-11-07 DOA: 07/20/2020 PCP: Cassandria Anger, MD    Brief Narrative:  86 y.o. female with a past medical history of gout, essential hypertension, glaucoma, history of osteoarthritis, diabetes mellitus type 2 who was in her usual state of health till earlier today when she was with a friend and was watering her plants when the patient made a sudden turn to the right and then lost her balance and fell onto her right side and her arm.  Denies hitting her head against the ground.  Pain is currently 10 out of 10 in intensity.  She is experiencing some muscle spasms as well.  She denies any chest pain shortness of breath.  No nausea vomiting.  She does mention pain in the lower back as well towards the right.  In the emergency department x-ray revealed fracture of the right femoral neck.  She will be hospitalized for further management.  She is noted to be bradycardic.  She is noted to be on beta-blockers.  Assessment & Plan:   Active Problems:   Type 2 diabetes mellitus with renal manifestations, controlled (HCC)   Essential hypertension   Cerebral artery occlusion with cerebral infarction [facility])   Hip fracture (HCC)   Sinus bradycardia  1. Right hip fracture and low back pain:  -orthopedic surgery consulted -Pt now s/p surgery 7/29 -PT/OT post op per orthopedic surgery -Would continue with analgesia as needed -laying in bed, appears comfortable  2. Sinus bradycardia:  -She is noted to be on carvedilol prior to admit, currently on hold -Her TSH was normal when checked earlier this month.   -Heart rate currently within normal limits vital signs reviewed, remains stable at this time  3.  Chronic kidney disease stage IIIb:  -Creatinine has risen to over 2 -Continue to monitor urine output.  -Cr now steadily improving -Will repeat bmet in AM  4.  Essential hypertension:  -At home she is on amlodipine, carvedilol, Cozaar.    -held Cozaar due to elevated creatinine and coreg per above for presenting bradycardia -Blood pressure trending up -Will continue norvasc, increase dose to 10mg  to compensate holding coreg and cozaar  5. Normocytic anemia with acute blood loss anemia: Likely anemia of chronic disease.   -Hemoglobin initially noted to be near baseline prior to surgery -Recently required 2 units of PRBC's pos-operatively -This AM, hgb of 6.7. Have ordered 2 more units PRBC's -Recheck CBC in the morning  6. Thrombocytopenia:  -Platelet count with a low of 60 -Plts now trending up, currently 80  7. Diabetes mellitus type 2, with renal complications with chronic kidney disease:  -On Metformin at home which will be held while in hospital. -Continue  SSI.  HBA1C was 6.1 on July 06, 2020.  8. History of gout: Continue allopurinol as tolerated. Stable at this time  9. Acute blood loss anemia -Suspect secondary to recent surgery. -Patient was made aware of her anemia, agreeable to blood transfusion -See above. Given 2 units earlier in this course -Given an additional 2 units for hgb of 6.7 on 8/2 -No evidence of acute blood loss -Repeat cbc in AM  10. Fever -Tmax of 38.2 this afternoon -No leukocytosis -will check UA and CXR -Tylenol PRN fevers  DVT prophylaxis: SCD's Code Status: Full Family Communication: Pt in room, family not at bedside  Status is: Inpatient  Remains inpatient appropriate because:Ongoing diagnostic testing needed not appropriate for outpatient work up and Unsafe d/c plan  Dispo: The patient is from: Home              Anticipated d/c is to: SNF              Anticipated d/c date is: 1 day              Patient currently is not medically stable to d/c.   Consultants:   Orthopedic Surgery  Procedures:  Intramedullary nail intertrochantric 7/29  Antimicrobials: Anti-infectives (From admission, onward)   Start     Dose/Rate Route Frequency Ordered Stop    07/21/20 1800  ceFAZolin (ANCEF) IVPB 1 g/50 mL premix        1 g 100 mL/hr over 30 Minutes Intravenous Every 6 hours 07/21/20 1419 07/22/20 0618   07/21/20 1100  ceFAZolin (ANCEF) IVPB 2g/100 mL premix        2 g 200 mL/hr over 30 Minutes Intravenous On call to O.R. 07/21/20 1004 07/21/20 1200      Subjective: No complaints this AM  Objective: Vitals:   07/25/20 2200 07/26/20 0455 07/26/20 0823 07/26/20 1320  BP: (!) 150/62 (!) 164/61 (!) 147/62 (!) 164/55  Pulse: 63 61 (!) 56 67  Resp: 17 14 16 16   Temp: 98.7 F (37.1 C) 98.9 F (37.2 C) 99.2 F (37.3 C) (!) 100.7 F (38.2 C)  TempSrc: Oral Oral Oral Oral  SpO2: 100% 100% 100% 100%  Weight:      Height:        Intake/Output Summary (Last 24 hours) at 07/26/2020 1523 Last data filed at 07/26/2020 6948 Gross per 24 hour  Intake 1332.31 ml  Output 1053 ml  Net 279.31 ml   Filed Weights   07/20/20 2054  Weight: 64.9 kg    Examination: General exam: Awake, laying in bed, in nad Respiratory system: Normal respiratory effort, no wheezing Cardiovascular system: regular rate, s1, s2 Gastrointestinal system: Soft, nondistended, positive BS Central nervous system: CN2-12 grossly intact, strength intact Extremities: Perfused, no clubbing Skin: Normal skin turgor, no notable skin lesions seen Psychiatry: Mood normal // no visual hallucinations   Data Reviewed: I have personally reviewed following labs and imaging studies  CBC: Recent Labs  Lab 07/20/20 2101 07/21/20 0446 07/22/20 0801 07/23/20 0802 07/24/20 0349 07/25/20 0753 07/26/20 0300  WBC 6.0   < > 9.9 12.5* 10.2 9.0 8.2  NEUTROABS 4.4  --   --   --   --   --   --   HGB 9.7*   < > 5.9* 7.6* 7.2* 6.7* 8.7*  HCT 30.8*   < > 18.5* 23.2* 21.8* 20.5* 26.1*  MCV 90.6   < > 90.7 85.0 86.5 88.4 87.3  PLT 98*   < > 65* 60* 64* 83* 80*   < > = values in this interval not displayed.   Basic Metabolic Panel: Recent Labs  Lab 07/21/20 0446 07/22/20 0801 07/23/20  0802 07/24/20 0349 07/25/20 0753  NA 141 140 140 139 139  K 4.4 5.3* 4.6 4.3 4.5  CL 109 109 111 111 108  CO2 23 22 21* 22 23  GLUCOSE 160* 132* 128* 111* 108*  BUN 27* 39* 43* 35* 26*  CREATININE 1.55* 2.01* 1.95* 1.50* 1.28*  CALCIUM 9.2 8.2* 7.9* 8.1* 8.4*   GFR: Estimated Creatinine Clearance: 27.2 mL/min (A) (by C-G formula based on SCr of 1.28 mg/dL (H)). Liver Function Tests: Recent Labs  Lab 07/20/20 2101 07/22/20 0801 07/23/20 0802 07/24/20 0349 07/25/20 0753  AST 23 17 18  17 17  ALT 12 8 6  <5 6  ALKPHOS 81 52 45 49 46  BILITOT 0.5 0.1* 0.9 0.3 0.5  PROT 6.4* 5.0* 5.1* 4.9* 4.8*  ALBUMIN 3.4* 2.5* 2.4* 2.3* 2.2*   No results for input(s): LIPASE, AMYLASE in the last 168 hours. No results for input(s): AMMONIA in the last 168 hours. Coagulation Profile: No results for input(s): INR, PROTIME in the last 168 hours. Cardiac Enzymes: No results for input(s): CKTOTAL, CKMB, CKMBINDEX, TROPONINI in the last 168 hours. BNP (last 3 results) No results for input(s): PROBNP in the last 8760 hours. HbA1C: No results for input(s): HGBA1C in the last 72 hours. CBG: Recent Labs  Lab 07/25/20 1136 07/25/20 1603 07/25/20 2143 07/26/20 0649 07/26/20 1133  GLUCAP 110* 140* 124* 88  88 151*   Lipid Profile: No results for input(s): CHOL, HDL, LDLCALC, TRIG, CHOLHDL, LDLDIRECT in the last 72 hours. Thyroid Function Tests: No results for input(s): TSH, T4TOTAL, FREET4, T3FREE, THYROIDAB in the last 72 hours. Anemia Panel: No results for input(s): VITAMINB12, FOLATE, FERRITIN, TIBC, IRON, RETICCTPCT in the last 72 hours. Sepsis Labs: No results for input(s): PROCALCITON, LATICACIDVEN in the last 168 hours.  Recent Results (from the past 240 hour(s))  SARS Coronavirus 2 by RT PCR (hospital order, performed in [facility] hospital lab) Nasopharyngeal Nasopharyngeal Swab     Status: None   Collection Time: 07/20/20 10:18 PM   Specimen: Nasopharyngeal Swab  Result Value  Ref Range Status   SARS Coronavirus 2 NEGATIVE NEGATIVE Final    Comment: (NOTE) SARS-CoV-2 target nucleic acids are NOT DETECTED.  The SARS-CoV-2 RNA is generally detectable in upper and lower respiratory specimens during the acute phase of infection. The lowest concentration of SARS-CoV-2 viral copies this assay can detect is 250 copies / mL. A negative result does not preclude SARS-CoV-2 infection and should not be used as the sole basis for treatment or other patient management decisions.  A negative result may occur with improper specimen collection / handling, submission of specimen other than nasopharyngeal swab, presence of viral mutation(s) within the areas targeted by this assay, and inadequate number of viral copies (<250 copies / mL). A negative result must be combined with clinical observations, patient history, and epidemiological information.  Fact Sheet for Patients:   StrictlyIdeas.no  Fact Sheet for Healthcare Providers: BankingDealers.co.za  This test is not yet approved or  cleared by the [place] FDA and has been authorized for detection and/or diagnosis of SARS-CoV-2 by FDA under an Emergency Use Authorization (EUA).  This EUA will remain in effect (meaning this test can be used) for the duration of the COVID-19 declaration under Section 564(b)(1) of the Act, 21 U.S.C. section 360bbb-3(b)(1), unless the authorization is terminated or revoked sooner.  Performed at [facility], [place] [address], [place], [place] [address]   Surgical pcr screen     Status: None   Collection Time: 07/21/20  5:36 AM   Specimen: Nasal Mucosa; Nasal Swab  Result Value Ref Range Status   MRSA, PCR NEGATIVE NEGATIVE Final   Staphylococcus aureus NEGATIVE NEGATIVE Final    Comment: (NOTE) The Xpert SA Assay (FDA approved for NASAL specimens in patients 62 years of age and older), is one component of a comprehensive  surveillance program. It is not intended to diagnose infection nor to guide or monitor treatment. Performed at [facility], [place] [address], [place], [place] [address]      Radiology Studies: No results found.  Scheduled Meds: . allopurinol  150 mg Oral Daily  . aspirin EC  81 mg Oral BID  . Chlorhexidine Gluconate Cloth  6 each Topical Daily  . docusate sodium  100 mg Oral BID  . feeding supplement (GLUCERNA SHAKE)  237 mL Oral BID BM  . gabapentin  100 mg Oral TID  . insulin aspart  0-5 Units Subcutaneous QHS  . insulin aspart  0-9 Units Subcutaneous TID WC  . latanoprost  1 drop Both Eyes QHS  . multivitamin with minerals  1 tablet Oral Daily  . pantoprazole  40 mg Oral Daily  . senna  1 tablet Oral BID  . sertraline  100 mg Oral Daily   Continuous Infusions: . sodium chloride 75 mL/hr at 07/26/20 1324     LOS: 5 days   Marylu Lund, MD [facility] Pager On Amion  If 7PM-7AM, please contact night-coverage 07/26/2020, 3:23 PM

## 2020-07-26 NOTE — Plan of Care (Signed)

## 2020-07-26 NOTE — Progress Notes (Signed)
Rechecked temp at 99.5 orally. Pt has no complaints. Alert/oriented in no apparent distress. Denies any dizziness//palpitations. No nausea and vomiting noted.

## 2020-07-27 DIAGNOSIS — R001 Bradycardia, unspecified: Secondary | ICD-10-CM

## 2020-07-27 LAB — COMPREHENSIVE METABOLIC PANEL
ALT: 9 U/L (ref 0–44)
AST: 22 U/L (ref 15–41)
Albumin: 2.3 g/dL — ABNORMAL LOW (ref 3.5–5.0)
Alkaline Phosphatase: 56 U/L (ref 38–126)
Anion gap: 8 (ref 5–15)
BUN: 22 mg/dL (ref 8–23)
CO2: 23 mmol/L (ref 22–32)
Calcium: 8.4 mg/dL — ABNORMAL LOW (ref 8.9–10.3)
Chloride: 108 mmol/L (ref 98–111)
Creatinine, Ser: 1.24 mg/dL — ABNORMAL HIGH (ref 0.44–1.00)
GFR calc Af Amer: 46 mL/min — ABNORMAL LOW (ref >60)
GFR calc non Af Amer: 39 mL/min — ABNORMAL LOW (ref >60)
Glucose, Bld: 119 mg/dL — ABNORMAL HIGH (ref 70–99)
Potassium: 4.4 mmol/L (ref 3.5–5.1)
Sodium: 139 mmol/L (ref 135–145)
Total Bilirubin: 1.3 mg/dL — ABNORMAL HIGH (ref 0.3–1.2)
Total Protein: 5.3 g/dL — ABNORMAL LOW (ref 6.5–8.1)

## 2020-07-27 LAB — PATHOLOGIST SMEAR REVIEW

## 2020-07-27 LAB — URINALYSIS, ROUTINE W REFLEX MICROSCOPIC
Bilirubin Urine: NEGATIVE
Glucose, UA: NEGATIVE mg/dL
Hgb urine dipstick: NEGATIVE
Ketones, ur: NEGATIVE mg/dL
Leukocytes,Ua: NEGATIVE
Nitrite: NEGATIVE
Protein, ur: 30 mg/dL — AB
Specific Gravity, Urine: 1.014 (ref 1.005–1.030)
pH: 7 (ref 5.0–8.0)

## 2020-07-27 LAB — CBC
HCT: 27.4 % — ABNORMAL LOW (ref 36.0–46.0)
Hemoglobin: 9.1 g/dL — ABNORMAL LOW (ref 12.0–15.0)
MCH: 29.3 pg (ref 26.0–34.0)
MCHC: 33.2 g/dL (ref 30.0–36.0)
MCV: 88.1 fL (ref 80.0–100.0)
Platelets: 120 10*3/uL — ABNORMAL LOW (ref 150–400)
RBC: 3.11 MIL/uL — ABNORMAL LOW (ref 3.87–5.11)
RDW: 15.2 % (ref 11.5–15.5)
WBC: 9.9 10*3/uL (ref 4.0–10.5)
nRBC: 1.6 % — ABNORMAL HIGH (ref 0.0–0.2)

## 2020-07-27 LAB — GLUCOSE, CAPILLARY
Glucose-Capillary: 96 mg/dL (ref 70–99)
Glucose-Capillary: 119 mg/dL — ABNORMAL HIGH (ref 70–99)
Glucose-Capillary: 129 mg/dL — ABNORMAL HIGH (ref 70–99)
Glucose-Capillary: 131 mg/dL — ABNORMAL HIGH (ref 70–99)

## 2020-07-27 NOTE — Progress Notes (Signed)
Physical Therapy Treatment Patient Details Name: Breanna [name] MRN: [id number] DOB: 23-Feb-1931 Today's Date: 07/27/2020    History of Present Illness 86 y.o. female with a past medical history of gout, essential hypertension, glaucoma, osteoarthritis, diabetes mellitus type 2, CVA, L TKR, R total shoulder, adrenal adenoma admitted to ED on 7/29 after fall, sustaining R hip femoral neck fracture. S/p R hip IM nail on 7/29.    PT Comments    Pt seated reclined in recliner chair with lateral lean to the L.  Pt required multimodal cueing for activity progression.  She required mod/max/total assistance depending on activity.  Pt continues to benefit from rehab in a post acute setting to maximize functional gains before returning home.  Of note:  Pt with poor vision.      Follow Up Recommendations  SNF;Supervision/Assistance - 24 hour     Equipment Recommendations  None recommended by PT    Recommendations for Other Services       Precautions / Restrictions Precautions Precautions: Fall Restrictions Weight Bearing Restrictions: Yes RLE Weight Bearing: Weight bearing as tolerated    Mobility  Bed Mobility Overal bed mobility: Needs Assistance Bed Mobility: Sit to Supine       Sit to supine: Mod assist   General bed mobility comments: Mod assistance to move B LEs back to bed against gravity.  Utilized bed position in trendelenberg to boost to [facility].  Transfers Overall transfer level: Needs assistance Equipment used: Rolling walker (2 wheeled) Transfers: Sit to/from Stand Sit to Stand: Mod assist Stand pivot transfers: Max assist;Total assist       General transfer comment: Pt able to rise from recliner chair with decreased assistance.  Pt performed stand pivot with max-total assistance to step toward bed with flexed posture and sat prematurely requiring total assistance to move hips to the bed to avoid a fall.  Ambulation/Gait Ambulation/Gait assistance: Max assist;Total  assist Gait Distance (Feet): 6 Feet Assistive device: Rolling walker (2 wheeled) Gait Pattern/deviations: Step-to pattern;Trunk flexed;Antalgic;Shuffle     General Gait Details: Pt at time resting elbows on L side of RW.  Pt presents with a strong lean to the L side.   Stairs             Wheelchair Mobility    Modified Rankin (Stroke Patients Only)       Balance Overall balance assessment: Needs assistance Sitting-balance support: Feet supported;Single extremity supported Sitting balance-Leahy Scale: Fair Sitting balance - Comments: able to sit EOB unsuported once at EOB     Standing balance-Leahy Scale: Poor                              Cognition Arousal/Alertness: Awake/alert Behavior During Therapy: WFL for tasks assessed/performed Overall Cognitive Status: Within Functional Limits for tasks assessed                                 General Comments: pt highly motivated to work with therapy this date      Exercises General Exercises - Lower Extremity Ankle Circles/Pumps: AROM;Both;10 reps;Supine Quad Sets: AROM;Right;10 reps;Supine Long Arc Quad: AAROM;Right;10 reps;Seated Heel Slides: AAROM;Right;10 reps;Supine Hip ABduction/ADduction: AAROM;Right;10 reps;Supine    General Comments        Pertinent Vitals/Pain Pain Assessment: Faces Faces Pain Scale: Hurts whole lot Pain Location: R hip Pain Descriptors / Indicators: Grimacing;Guarding;Discomfort;Sore Pain Intervention(s): Monitored during session;Repositioned  Home Living                      Prior Function            PT Goals (current goals can now be found in the care plan section) Acute Rehab PT Goals Patient Stated Goal: get stronger at rehab Potential to Achieve Goals: Good Progress towards PT goals: Progressing toward goals    Frequency    Min 3X/week      PT Plan Current plan remains appropriate    Co-evaluation               AM-PAC PT "6 Clicks" Mobility   Outcome Measure  Help needed turning from your back to your side while in a flat bed without using bedrails?: A Little Help needed moving from lying on your back to sitting on the side of a flat bed without using bedrails?: A Lot Help needed moving to and from a bed to a chair (including a wheelchair)?: Total Help needed standing up from a chair using your arms (e.g., wheelchair or bedside chair)?: Total Help needed to walk in hospital room?: Total Help needed climbing 3-5 steps with a railing? : Total 6 Click Score: 9    End of Session Equipment Utilized During Treatment: Gait belt Activity Tolerance: Patient tolerated treatment well;Patient limited by pain Patient left: in bed;with call bell/phone within reach;with bed alarm set Nurse Communication: Mobility status PT Visit Diagnosis: Other abnormalities of gait and mobility (R26.89);Muscle weakness (generalized) (M62.81);Difficulty in walking, not elsewhere classified (R26.2);Pain Pain - Right/Left: Right Pain - part of body: Hip     Time: 8295-6213 PT Time Calculation (min) (ACUTE ONLY): 32 min  Charges:  $Therapeutic Exercise: 8-22 mins $Therapeutic Activity: 8-22 mins                     Erasmo Leventhal , PTA Acute Rehabilitation Services Pager [phone] Office [phone]     Shain Pauwels Eli Hose 07/27/2020, 5:49 PM

## 2020-07-27 NOTE — Progress Notes (Signed)
PROGRESS NOTE    Breanna [name]  XBW:[id number] DOB: 1931/04/10 DOA: 07/20/2020 PCP: Cassandria Anger, MD    Brief Narrative:  86 y.o.femalewith a past medical history of gout, essential hypertension, glaucoma, history of osteoarthritis, diabetes mellitus type 2 who was in her usual state of health till earlier today when she was with a friendandwas watering her plants when the patient made a sudden turn to the right and then lost her balance and fell onto her right side and her arm.Denies hitting her head against the ground. Pain is currently 10 out of 10 in intensity. She is experiencing some muscle spasms as well. She denies any chest pain shortness of breath. No nausea vomiting. She does mention pain in the lower back as well towards the right.  In the emergency department x-ray revealed fracture of the right femoral neck. She will be hospitalized for further management. She is noted to be bradycardic. She is noted to be on beta-blockers.    Consultants:   Orthopedics  Procedures: Status post hip repair on 7/29  Antimicrobials:       Subjective: Has no complaints.  Denies shortness of breath, chest pain  Objective: Vitals:   07/26/20 1400 07/26/20 2008 07/27/20 0423 07/27/20 0739  BP:  (!) 131/50 (!) 142/65 (!) 142/58  Pulse:  (!) 59 62 63  Resp:  18 14 18   Temp: 99.5 F (37.5 C) 99.1 F (37.3 C) 99 F (37.2 C) 99.6 F (37.6 C)  TempSrc: Oral Oral Oral Oral  SpO2:  100% 99% 97%  Weight:      Height:        Intake/Output Summary (Last 24 hours) at 07/27/2020 1203 Last data filed at 07/27/2020 1020 Gross per 24 hour  Intake 360 ml  Output 1100 ml  Net -740 ml   Filed Weights   07/20/20 2054  Weight: 64.9 kg    Examination:  General exam: Appears calm and comfortable  Respiratory system: Clear to auscultation. Respiratory effort normal. Cardiovascular system: S1 & S2 heard, RRR. No JVD, murmurs, rubs, gallops or clicks. No pedal  edema. Gastrointestinal system: Abdomen is nondistended, soft and nontender. No organomegaly or masses felt. Normal bowel sounds heard. Central nervous system: Alert and oriented. No focal neurological deficits. Extremities: Symmetric 5 x 5 power. Skin: No rashes, lesions or ulcers Psychiatry: Judgement and insight appear normal. Mood & affect appropriate.     Data Reviewed: I have personally reviewed following labs and imaging studies  CBC: Recent Labs  Lab 07/20/20 2101 07/21/20 0446 07/23/20 0802 07/24/20 0349 07/25/20 0753 07/26/20 0300 07/27/20 0309  WBC 6.0   < > 12.5* 10.2 9.0 8.2 9.9  NEUTROABS 4.4  --   --   --   --   --   --   HGB 9.7*   < > 7.6* 7.2* 6.7* 8.7* 9.1*  HCT 30.8*   < > 23.2* 21.8* 20.5* 26.1* 27.4*  MCV 90.6   < > 85.0 86.5 88.4 87.3 88.1  PLT 98*   < > 60* 64* 83* 80* 120*   < > = values in this interval not displayed.   Basic Metabolic Panel: Recent Labs  Lab 07/22/20 0801 07/23/20 0802 07/24/20 0349 07/25/20 0753 07/27/20 0309  NA 140 140 139 139 139  K 5.3* 4.6 4.3 4.5 4.4  CL 109 111 111 108 108  CO2 22 21* 22 23 23   GLUCOSE 132* 128* 111* 108* 119*  BUN 39* 43* 35* 26* 22  CREATININE  2.01* 1.95* 1.50* 1.28* 1.24*  CALCIUM 8.2* 7.9* 8.1* 8.4* 8.4*   GFR: Estimated Creatinine Clearance: 28.1 mL/min (A) (by C-G formula based on SCr of 1.24 mg/dL (H)). Liver Function Tests: Recent Labs  Lab 07/22/20 0801 07/23/20 0802 07/24/20 0349 07/25/20 0753 07/27/20 0309  AST 17 18 17 17 22   ALT 8 6 5 6 9   ALKPHOS 52 45 49 46 56  BILITOT 0.1* 0.9 0.3 0.5 1.3*  PROT 5.0* 5.1* 4.9* 4.8* 5.3*  ALBUMIN 2.5* 2.4* 2.3* 2.2* 2.3*   No results for input(s): LIPASE, AMYLASE in the last 168 hours. No results for input(s): AMMONIA in the last 168 hours. Coagulation Profile: No results for input(s): INR, PROTIME in the last 168 hours. Cardiac Enzymes: No results for input(s): CKTOTAL, CKMB, CKMBINDEX, TROPONINI in the last 168 hours. BNP (last 3  results) No results for input(s): PROBNP in the last 8760 hours. HbA1C: No results for input(s): HGBA1C in the last 72 hours. CBG: Recent Labs  Lab 07/26/20 1133 07/26/20 1751 07/26/20 2121 07/27/20 0650 07/27/20 1145  GLUCAP 151* 121* 134* 96 119*   Lipid Profile: No results for input(s): CHOL, HDL, LDLCALC, TRIG, CHOLHDL, LDLDIRECT in the last 72 hours. Thyroid Function Tests: No results for input(s): TSH, T4TOTAL, FREET4, T3FREE, THYROIDAB in the last 72 hours. Anemia Panel: No results for input(s): VITAMINB12, FOLATE, FERRITIN, TIBC, IRON, RETICCTPCT in the last 72 hours. Sepsis Labs: No results for input(s): PROCALCITON, LATICACIDVEN in the last 168 hours.  Recent Results (from the past 240 hour(s))  SARS Coronavirus 2 by RT PCR (hospital order, performed in [facility] hospital lab) Nasopharyngeal Nasopharyngeal Swab     Status: None   Collection Time: 07/20/20 10:18 PM   Specimen: Nasopharyngeal Swab  Result Value Ref Range Status   SARS Coronavirus 2 NEGATIVE NEGATIVE Final    Comment: (NOTE) SARS-CoV-2 target nucleic acids are NOT DETECTED.  The SARS-CoV-2 RNA is generally detectable in upper and lower respiratory specimens during the acute phase of infection. The lowest concentration of SARS-CoV-2 viral copies this assay can detect is 250 copies / mL. A negative result does not preclude SARS-CoV-2 infection and should not be used as the sole basis for treatment or other patient management decisions.  A negative result may occur with improper specimen collection / handling, submission of specimen other than nasopharyngeal swab, presence of viral mutation(s) within the areas targeted by this assay, and inadequate number of viral copies (<250 copies / mL). A negative result must be combined with clinical observations, patient history, and epidemiological information.  Fact Sheet for Patients:   StrictlyIdeas.no  Fact Sheet for Healthcare  Providers: BankingDealers.co.za  This test is not yet approved or  cleared by the [place] FDA and has been authorized for detection and/or diagnosis of SARS-CoV-2 by FDA under an Emergency Use Authorization (EUA).  This EUA will remain in effect (meaning this test can be used) for the duration of the COVID-19 declaration under Section 564(b)(1) of the Act, 21 U.S.C. section 360bbb-3(b)(1), unless the authorization is terminated or revoked sooner.  Performed at [facility], [place] [address], [place], [place] [address]   Surgical pcr screen     Status: None   Collection Time: 07/21/20  5:36 AM   Specimen: Nasal Mucosa; Nasal Swab  Result Value Ref Range Status   MRSA, PCR NEGATIVE NEGATIVE Final   Staphylococcus aureus NEGATIVE NEGATIVE Final    Comment: (NOTE) The Xpert SA Assay (FDA approved for NASAL specimens in  patients 14 years of age and older), is one component of a comprehensive surveillance program. It is not intended to diagnose infection nor to guide or monitor treatment. Performed at [facility], [place] [address] [place], [place] [address]   SARS Coronavirus 2 by RT PCR (hospital order, performed in [facility] hospital lab) Nasopharyngeal Nasopharyngeal Swab     Status: None   Collection Time: 07/26/20  3:30 PM   Specimen: Nasopharyngeal Swab  Result Value Ref Range Status   SARS Coronavirus 2 NEGATIVE NEGATIVE Final    Comment: (NOTE) SARS-CoV-2 target nucleic acids are NOT DETECTED.  The SARS-CoV-2 RNA is generally detectable in upper and lower respiratory specimens during the acute phase of infection. The lowest concentration of SARS-CoV-2 viral copies this assay can detect is 250 copies / mL. A negative result does not preclude SARS-CoV-2 infection and should not be used as the sole basis for treatment or other patient management decisions.  A negative result may occur with improper specimen collection / handling,  submission of specimen other than nasopharyngeal swab, presence of viral mutation(s) within the areas targeted by this assay, and inadequate number of viral copies (<250 copies / mL). A negative result must be combined with clinical observations, patient history, and epidemiological information.  Fact Sheet for Patients:   StrictlyIdeas.no  Fact Sheet for Healthcare Providers: BankingDealers.co.za  This test is not yet approved or  cleared by the [place] FDA and has been authorized for detection and/or diagnosis of SARS-CoV-2 by FDA under an Emergency Use Authorization (EUA).  This EUA will remain in effect (meaning this test can be used) for the duration of the COVID-19 declaration under Section 564(b)(1) of the Act, 21 U.S.C. section 360bbb-3(b)(1), unless the authorization is terminated or revoked sooner.  Performed at [facility], [place] [address], [place], [place] [address]          Radiology Studies: DG CHEST PORT 1 VIEW  Result Date: 07/26/2020 CLINICAL DATA:  Fever EXAM: PORTABLE CHEST 1 VIEW COMPARISON:  July 20, 2020 FINDINGS: Lungs are clear. Heart is upper normal in size with pulmonary vascularity normal. No adenopathy. No bone lesions. IMPRESSION: Lungs clear.  Heart upper normal in size. Electronically Signed   By: Lowella Grip III M.D.   On: 07/26/2020 15:51        Scheduled Meds: . allopurinol  150 mg Oral Daily  . amLODipine  10 mg Oral Daily  . aspirin EC  81 mg Oral BID  . Chlorhexidine Gluconate Cloth  6 each Topical Daily  . docusate sodium  100 mg Oral BID  . feeding supplement (GLUCERNA SHAKE)  237 mL Oral BID BM  . gabapentin  100 mg Oral TID  . insulin aspart  0-5 Units Subcutaneous QHS  . insulin aspart  0-9 Units Subcutaneous TID WC  . latanoprost  1 drop Both Eyes QHS  . multivitamin with minerals  1 tablet Oral Daily  . pantoprazole  40 mg Oral Daily  . senna  1 tablet Oral  BID  . sertraline  100 mg Oral Daily   Continuous Infusions: . sodium chloride 75 mL/hr at 07/27/20 0616    Assessment & Plan:   Active Problems:   Type 2 diabetes mellitus with renal manifestations, controlled (HCC)   Essential hypertension   Cerebral artery occlusion with cerebral infarction [facility])   Hip fracture (HCC)   Sinus bradycardia   1. Right hip fracture and low back pain:  -orthopedic surgery consulted -  Pt now s/p surgery 7/29 -PT/OT -recommend SNF  pain management-minimize narcotics Keep dressing dry WBAT to RLE Follow-up with Ortho in 2 weeks Started on aspirin 81 twice daily for DVT prophylaxis   2. Sinus bradycardia:  -She is noted to be on carvedilol prior to admit, currently on hold -Her TSH was normal when checked earlier this month.  Heart rate remained stable, asymptomatic we will continue to monitor  3. Chronic kidney disease stage IIIb:  -Creatinine had risen to over 2 Improving, creatinine 1.24 today Continue to monitor  4. Essential hypertension:  -At home she is on amlodipine, carvedilol, Cozaar.  -held Cozaar due to elevated creatinine and coreg per above for presenting bradycardia -Continue Norvasc 10 mg daily.  If need to will add hydralazine, will for now we will continue to monitor    5. Normocytic anemia with acute blood loss anemia: Likely anemia of chronic disease.  -Hemoglobin initially noted to be near baseline prior to surgery -Recently required 2 units of PRBC's pos-operatively -Then hgb of 6.7. Had ordered 2 more units PRBC's Now H&H stable, will continue to monitor   6. Thrombocytopenia:  -Platelet count with a low of 60 Now increasing, platelets 120, will continue to monitor   7. Diabetes mellitus type 2, with renal complications with chronic kidney disease:  -On Metformin at home which will be held while in hospital. -Continue SSI. HBA1C was 6.1 on July 06, 2020.  8. History of gout: Continue allopurinol  as tolerated.  Stable currently  9. Acute blood loss anemia -Suspect secondary to recent surgery. -Patient was made aware of her anemia, agreeable to blood transfusion -See above. Given 2 units earlier in this course -Given an additional 2 units for hgb of 6.7 on 8/2 -No evidence of acute blood loss H&H continues to be stable today  10. Fever T-max 100.7 on 8/3 -No leukocytosis.  Possibly due to transfusion ? No infection noted. UA and chest x-rays were negative -Tylenol PRN fevers   DVT prophylaxis: SCD, aspirin twice daily Code Status: Full Family Communication: None at bedside  disposition Plan: SNF pending  status is: Inpatient   Remains inpatient appropriate because:Unsafe d/c plan   Dispo: The patient is : Home              Anticipated d/c is to: SNF              Anticipated d/c date is: 3 days              Patient currently is medically stable to d/c.Unsafe d/c, needs SNF pending.            LOS: 6 days   Time spent: 35 minutes with more than 50% on [place], MD [facility] Pager [phone]  If 7PM-7AM, please contact night-coverage [email or web address] Password [facility] 07/27/2020, 12:03 PM

## 2020-07-28 DIAGNOSIS — S72001S Fracture of unspecified part of neck of right femur, sequela: Secondary | ICD-10-CM

## 2020-07-28 LAB — CREATININE, SERUM
Creatinine, Ser: 1.09 mg/dL — ABNORMAL HIGH (ref 0.44–1.00)
GFR calc Af Amer: 53 mL/min — ABNORMAL LOW (ref >60)
GFR calc non Af Amer: 46 mL/min — ABNORMAL LOW (ref >60)

## 2020-07-28 LAB — GLUCOSE, CAPILLARY
Glucose-Capillary: 90 mg/dL (ref 70–99)
Glucose-Capillary: 93 mg/dL (ref 70–99)
Glucose-Capillary: 124 mg/dL — ABNORMAL HIGH (ref 70–99)
Glucose-Capillary: 146 mg/dL — ABNORMAL HIGH (ref 70–99)

## 2020-07-28 LAB — CBC
HCT: 27.5 % — ABNORMAL LOW (ref 36.0–46.0)
Hemoglobin: 9.1 g/dL — ABNORMAL LOW (ref 12.0–15.0)
MCH: 29.4 pg (ref 26.0–34.0)
MCHC: 33.1 g/dL (ref 30.0–36.0)
MCV: 88.7 fL (ref 80.0–100.0)
Platelets: 123 10*3/uL — ABNORMAL LOW (ref 150–400)
RBC: 3.1 MIL/uL — ABNORMAL LOW (ref 3.87–5.11)
RDW: 15.3 % (ref 11.5–15.5)
WBC: 9.5 10*3/uL (ref 4.0–10.5)
nRBC: 0.5 % — ABNORMAL HIGH (ref 0.0–0.2)

## 2020-07-28 MED ORDER — ENSURE ENLIVE PO LIQD
237.0000 mL | Freq: Every day | ORAL | Status: DC
  Administered 2020-07-28 – 2020-07-29 (×2): 237 mL via ORAL

## 2020-07-28 NOTE — Progress Notes (Addendum)
PROGRESS NOTE    Breanna [name]  OIB:[id number] DOB: 07/25/1931 DOA: 07/20/2020 PCP: Cassandria Anger, MD    Brief Narrative:  86 y.o.femalewith a past medical history of gout, essential hypertension, glaucoma, history of osteoarthritis, diabetes mellitus type 2 who was in her usual state of health till earlier today when she was with a friendandwas watering her plants when the patient made a sudden turn to the right and then lost her balance and fell onto her right side and her arm.Denies hitting her head against the ground. Pain is currently 10 out of 10 in intensity. She is experiencing some muscle spasms as well. She denies any chest pain shortness of breath. No nausea vomiting. She does mention pain in the lower back as well towards the right.  In the emergency department x-ray revealed fracture of the right femoral neck. She will be hospitalized for further management. She is noted to be bradycardic. She is noted to be on beta-blockers.    Consultants:   Orthopedics  Procedures: Status post hip repair on 7/29  Antimicrobials:       Subjective: No complaints. Denies dysuria, v/n abd pain or chills.Febrile yesterday afternoon. Tmax 101.1.  Objective: Vitals:   07/27/20 1535 07/27/20 1918 07/28/20 0300 07/28/20 0716  BP: (!) 137/59 (!) 148/62 138/61 138/62  Pulse: 67 72 73 67  Resp: 16 17 16 17   Temp: (!) 101.1 F (38.4 C) 99.7 F (37.6 C) 99.6 F (37.6 C) 99.8 F (37.7 C)  TempSrc: Oral Oral Oral Oral  SpO2: 99% 98% 97% 98%  Weight:      Height:        Intake/Output Summary (Last 24 hours) at 07/28/2020 0820 Last data filed at 07/28/2020 0417 Gross per 24 hour  Intake 720 ml  Output 400 ml  Net 320 ml   Filed Weights   07/20/20 2054  Weight: 64.9 kg    Examination:  General exam: Appears calm and comfortable  Respiratory system: Clear to auscultation. Respiratory effort normal.no w/r/r Cardiovascular system:Regular , s1/s2 no  m/g/r Gastrointestinal system:soft , nt/nd +bs Central nervous system: Alert and oriented.grossly intact Extremities: no edema Skin: warm, dry Psychiatry: Judgement and insight appear normal. Mood & affect appropriate in current setting.     Data Reviewed: I have personally reviewed following labs and imaging studies  CBC: Recent Labs  Lab 07/24/20 0349 07/25/20 0753 07/26/20 0300 07/27/20 0309 07/28/20 0340  WBC 10.2 9.0 8.2 9.9 9.5  HGB 7.2* 6.7* 8.7* 9.1* 9.1*  HCT 21.8* 20.5* 26.1* 27.4* 27.5*  MCV 86.5 88.4 87.3 88.1 88.7  PLT 64* 83* 80* 120* 945*   Basic Metabolic Panel: Recent Labs  Lab 07/22/20 0801 07/22/20 0801 07/23/20 0802 07/24/20 0349 07/25/20 0753 07/27/20 0309 07/28/20 0340  NA 140  --  140 139 139 139  --   K 5.3*  --  4.6 4.3 4.5 4.4  --   CL 109  --  111 111 108 108  --   CO2 22  --  21* 22 23 23   --   GLUCOSE 132*  --  128* 111* 108* 119*  --   BUN 39*  --  43* 35* 26* 22  --   CREATININE 2.01*   < > 1.95* 1.50* 1.28* 1.24* 1.09*  CALCIUM 8.2*  --  7.9* 8.1* 8.4* 8.4*  --    < > = values in this interval not displayed.   GFR: Estimated Creatinine Clearance: 32 mL/min (A) (by C-G formula based  on SCr of 1.09 mg/dL (H)). Liver Function Tests: Recent Labs  Lab 07/22/20 0801 07/23/20 0802 07/24/20 0349 07/25/20 0753 07/27/20 0309  AST 17 18 17 17 22   ALT 8 6 5 6 9   ALKPHOS 52 45 49 46 56  BILITOT 0.1* 0.9 0.3 0.5 1.3*  PROT 5.0* 5.1* 4.9* 4.8* 5.3*  ALBUMIN 2.5* 2.4* 2.3* 2.2* 2.3*   No results for input(s): LIPASE, AMYLASE in the last 168 hours. No results for input(s): AMMONIA in the last 168 hours. Coagulation Profile: No results for input(s): INR, PROTIME in the last 168 hours. Cardiac Enzymes: No results for input(s): CKTOTAL, CKMB, CKMBINDEX, TROPONINI in the last 168 hours. BNP (last 3 results) No results for input(s): PROBNP in the last 8760 hours. HbA1C: No results for input(s): HGBA1C in the last 72  hours. CBG: Recent Labs  Lab 07/27/20 0650 07/27/20 1145 07/27/20 1631 07/27/20 2052 07/28/20 0634  GLUCAP 96 119* 129* 131* 90   Lipid Profile: No results for input(s): CHOL, HDL, LDLCALC, TRIG, CHOLHDL, LDLDIRECT in the last 72 hours. Thyroid Function Tests: No results for input(s): TSH, T4TOTAL, FREET4, T3FREE, THYROIDAB in the last 72 hours. Anemia Panel: No results for input(s): VITAMINB12, FOLATE, FERRITIN, TIBC, IRON, RETICCTPCT in the last 72 hours. Sepsis Labs: No results for input(s): PROCALCITON, LATICACIDVEN in the last 168 hours.  Recent Results (from the past 240 hour(s))  SARS Coronavirus 2 by RT PCR (hospital order, performed in [facility] hospital lab) Nasopharyngeal Nasopharyngeal Swab     Status: None   Collection Time: 07/20/20 10:18 PM   Specimen: Nasopharyngeal Swab  Result Value Ref Range Status   SARS Coronavirus 2 NEGATIVE NEGATIVE Final    Comment: (NOTE) SARS-CoV-2 target nucleic acids are NOT DETECTED.  The SARS-CoV-2 RNA is generally detectable in upper and lower respiratory specimens during the acute phase of infection. The lowest concentration of SARS-CoV-2 viral copies this assay can detect is 250 copies / mL. A negative result does not preclude SARS-CoV-2 infection and should not be used as the sole basis for treatment or other patient management decisions.  A negative result may occur with improper specimen collection / handling, submission of specimen other than nasopharyngeal swab, presence of viral mutation(s) within the areas targeted by this assay, and inadequate number of viral copies (<250 copies / mL). A negative result must be combined with clinical observations, patient history, and epidemiological information.  Fact Sheet for Patients:   StrictlyIdeas.no  Fact Sheet for Healthcare Providers: BankingDealers.co.za  This test is not yet approved or  cleared by the [place] FDA  and has been authorized for detection and/or diagnosis of SARS-CoV-2 by FDA under an Emergency Use Authorization (EUA).  This EUA will remain in effect (meaning this test can be used) for the duration of the COVID-19 declaration under Section 564(b)(1) of the Act, 21 U.S.C. section 360bbb-3(b)(1), unless the authorization is terminated or revoked sooner.  Performed at [facility], [place] [address] [place], [place] [address]   Surgical pcr screen     Status: None   Collection Time: 07/21/20  5:36 AM   Specimen: Nasal Mucosa; Nasal Swab  Result Value Ref Range Status   MRSA, PCR NEGATIVE NEGATIVE Final   Staphylococcus aureus NEGATIVE NEGATIVE Final    Comment: (NOTE) The Xpert SA Assay (FDA approved for NASAL specimens in patients 98 years of age and older), is one component of a comprehensive surveillance program. It is not intended to diagnose infection nor to guide  or monitor treatment. Performed at [facility], [place] [address], [place], [place] [address]   SARS Coronavirus 2 by RT PCR (hospital order, performed in [facility] hospital lab) Nasopharyngeal Nasopharyngeal Swab     Status: None   Collection Time: 07/26/20  3:30 PM   Specimen: Nasopharyngeal Swab  Result Value Ref Range Status   SARS Coronavirus 2 NEGATIVE NEGATIVE Final    Comment: (NOTE) SARS-CoV-2 target nucleic acids are NOT DETECTED.  The SARS-CoV-2 RNA is generally detectable in upper and lower respiratory specimens during the acute phase of infection. The lowest concentration of SARS-CoV-2 viral copies this assay can detect is 250 copies / mL. A negative result does not preclude SARS-CoV-2 infection and should not be used as the sole basis for treatment or other patient management decisions.  A negative result may occur with improper specimen collection / handling, submission of specimen other than nasopharyngeal swab, presence of viral mutation(s) within the areas targeted by this assay, and  inadequate number of viral copies (<250 copies / mL). A negative result must be combined with clinical observations, patient history, and epidemiological information.  Fact Sheet for Patients:   StrictlyIdeas.no  Fact Sheet for Healthcare Providers: BankingDealers.co.za  This test is not yet approved or  cleared by the [place] FDA and has been authorized for detection and/or diagnosis of SARS-CoV-2 by FDA under an Emergency Use Authorization (EUA).  This EUA will remain in effect (meaning this test can be used) for the duration of the COVID-19 declaration under Section 564(b)(1) of the Act, 21 U.S.C. section 360bbb-3(b)(1), unless the authorization is terminated or revoked sooner.  Performed at [facility], [place] [address], [place], [place] [address]          Radiology Studies: DG CHEST PORT 1 VIEW  Result Date: 07/26/2020 CLINICAL DATA:  Fever EXAM: PORTABLE CHEST 1 VIEW COMPARISON:  July 20, 2020 FINDINGS: Lungs are clear. Heart is upper normal in size with pulmonary vascularity normal. No adenopathy. No bone lesions. IMPRESSION: Lungs clear.  Heart upper normal in size. Electronically Signed   By: Lowella Grip III M.D.   On: 07/26/2020 15:51        Scheduled Meds: . allopurinol  150 mg Oral Daily  . amLODipine  10 mg Oral Daily  . aspirin EC  81 mg Oral BID  . Chlorhexidine Gluconate Cloth  6 each Topical Daily  . docusate sodium  100 mg Oral BID  . feeding supplement (GLUCERNA SHAKE)  237 mL Oral BID BM  . gabapentin  100 mg Oral TID  . insulin aspart  0-5 Units Subcutaneous QHS  . insulin aspart  0-9 Units Subcutaneous TID WC  . latanoprost  1 drop Both Eyes QHS  . multivitamin with minerals  1 tablet Oral Daily  . pantoprazole  40 mg Oral Daily  . senna  1 tablet Oral BID  . sertraline  100 mg Oral Daily   Continuous Infusions:   Assessment & Plan:   Active Problems:   Type 2 diabetes  mellitus with renal manifestations, controlled (HCC)   Essential hypertension   Cerebral artery occlusion with cerebral infarction [facility])   Hip fracture (HCC)   Sinus bradycardia   1. Right hip fracture and low back pain:  -orthopedic surgery consulted -Pt now s/p surgery 7/29 -PT/OT -recommend SNF Minimize narcotics Keep dressing dry WBAT to RLE Follow-up with Ortho in 2 weeks Started on aspirin 81 twice daily for DVT prophylaxis   2.  Sinus bradycardia:  -She is noted to be on carvedilol prior to admit, currently on hold -Her TSH was normal when checked earlier this month.  HR stable, asx. Continue to monitor.  3. Chronic kidney disease stage IIIb:  -Creatinine had risen to over 2 Improving, creatinine today 1.09 Stable and continue to monitor.   4. Essential hypertension:  -At home she is on amlodipine, carvedilol, Cozaar.  -held Cozaar due to elevated creatinine and coreg per above for presenting bradycardia -continue Norvasc. Continue to monitor.   5. Normocytic anemia with acute blood loss anemia: Likely anemia of chronic disease.  -Hemoglobin initially noted to be near baseline prior to surgery -Recently required 2 units of PRBC's pos-operatively -Then hgb of 6.7. Had ordered 2 more units PRBC's H/h stable. Hg 9.1 today. Continue to monitor periodically   6. Thrombocytopenia:  -Platelet count with a low of 60 Improving, platelet 123 today. No signs of bleed. Continue to monitor.    7. Diabetes mellitus type 2, with renal complications with chronic kidney disease:  -On Metformin at home which will be held while in hospital. -Continue SSI. HBA1C was 6.1 on July 06, 2020.  8. History of gout: Continue allopurinol as tolerated.  Asx, stable.  9. Acute blood loss anemia -Suspect secondary to recent surgery. -Patient was made aware of her anemia, agreeable to blood transfusion -See above. Given 2 units earlier in this course -Given an  additional 2 units for hgb of 6.7 on 8/2 -No evidence of acute blood loss Hg 9.1, stable Continue to monitor  10. Fever T-max 101.1 now -No leukocytosis.  Possibly due to transfusion ? No infection noted. UA and chest x-rays were negative -Tylenol PRN fevers Continue monitoring   DVT prophylaxis: SCD, aspirin twice daily Code Status: Full Family Communication: None at bedside  disposition Plan: SNF pending  status is: Inpatient   Remains inpatient appropriate because:Unsafe d/c plan   Dispo: The patient is : Home              Anticipated d/c is to: SNF              Anticipated d/c date is: 1 day if afebrile overnight              Patient currently is medically stable to d/c.Unsafe d/c, needs SNF pending, if afebrile overnight d/c in am            LOS: 7 days   Time spent: 45 minutes with more than 50% on [place], MD [facility] Pager [phone]  If 7PM-7AM, please contact night-coverage [email or web address] Password [email or web address] 07/28/2020, 8:20 AM

## 2020-07-28 NOTE — Plan of Care (Signed)

## 2020-07-28 NOTE — Progress Notes (Signed)
Nutrition Follow-up  DOCUMENTATION CODES:   Not applicable  INTERVENTION:   -Ensure Enlive po daily, each supplement provides 350 kcal and 20 grams of protein -Continue MVI with minerals daily -D/c Magic cup TID with meals, each supplement provides 290 kcal and 9 grams of protein  NUTRITION DIAGNOSIS:   Increased nutrient needs related to post-op healing as evidenced by estimated needs.  Ongoing  GOAL:   Patient will meet greater than or equal to 90% of their needs  Progressing   MONITOR:   PO intake, Supplement acceptance, Diet advancement, Labs, Weight trends, Skin, I & O's  REASON FOR ASSESSMENT:   Consult Hip fracture protocol  ASSESSMENT:   Breanna [name] is a 86 y.o. female with a past medical history of gout, essential hypertension, glaucoma, history of osteoarthritis, diabetes mellitus type 2 who was in her usual state of health till earlier today when she was with a friend and was watering her plants when the patient made a sudden turn to the right and then lost her balance and fell onto her right side and her arm  7/29- s/p PROCEDURE: INTRAMEDULLARY (IM) NAIL INTERTROCHANTRIC  Reviewed I/O's: +560 ml x 24 hours and +431 ml since admission  UOP: 400 ml x 24 hours  Spoke with pt at bedside, who reports feeling "so much better" after having her bath. She shares that her appetite was good PTA- usually consuming cereal and juice in the morning and a dinner from meals on wheels in the later afternoon. She had a poor appetite post-operatively, but "it is picking up". She consumed two sausage patties and a bowl of oatmeal for breakfast this morning. Noted meal completion documented 50-100%.   Pt shares that she enjoys the nutritional supplement shakes and would like to receive them to help improve her intake. Pt shares that she is motivated to attend her grandson's wedding in September and ready to re-gain her independence. RD discussed importance of good meal and  supplement intake to promote healing. Also provided emotional support.   Per TOC notes, plan to d/c to SNF tomorrow (07/29/20).   Labs reviewed: CBGS: 90-131.   NUTRITION - FOCUSED PHYSICAL EXAM:    Most Recent Value  Orbital Region No depletion  Upper Arm Region Mild depletion  Thoracic and Lumbar Region No depletion  Buccal Region No depletion  Temple Region No depletion  Clavicle Bone Region No depletion  Clavicle and Acromion Bone Region No depletion  Scapular Bone Region No depletion  Dorsal Hand Mild depletion  Patellar Region No depletion  Anterior Thigh Region No depletion  Posterior Calf Region Mild depletion  Edema (RD Assessment) Mild  Hair Reviewed  Eyes Reviewed  Mouth Reviewed  Skin Reviewed  Nails Reviewed       Diet Order:   Diet Order            Diet regular Room service appropriate? Yes with Assist; Fluid consistency: Thin  Diet effective now                 EDUCATION NEEDS:   No education needs have been identified at this time  Skin:  Skin Assessment: Skin Integrity Issues: Skin Integrity Issues:: Incisions Incisions: closed rt hip  Last BM:  07/25/20  Height:   Ht Readings from Last 1 Encounters:  07/20/20 5\' 4"  (1.626 m)    Weight:   Wt Readings from Last 1 Encounters:  07/20/20 64.9 kg    Ideal Body Weight:  54.5 kg  BMI:  Body mass index is 24.55 kg/m.  Estimated Nutritional Needs:   Kcal:  1600-1800  Protein:  80-95 grams  Fluid:  > 1.6 L    Loistine Chance, RD, LDN, [place] Registered Dietitian II Certified Diabetes Care and Education Specialist Please refer to [facility] for RD and/or RD on-call/weekend/after hours pager

## 2020-07-28 NOTE — TOC Progression Note (Signed)
Transition of Care [facility]) - Progression Note    Patient Details  Name: Breanna [name] MRN: [id number] Date of Birth: 1931-12-17  Transition of Care [facility]) CM/SW Contact  Curlene Labrum, RN Phone Number: 07/28/2020, 12:54 PM  Clinical Narrative:    Case management spoke with the attending physician, Dr Kurtis Bushman this morning and patient will most likely discharge tomorrow, 07/29/2020 due to having a fever on 07/27/20.  I talked with [facility] and they are holding a bed for the patient.  [place] was called and the insurance is approved through 07/30/2020. Will continue to follow patient for transfer tomorrow.   Expected Discharge Plan: [place] Barriers to Discharge: Continued Medical Work up, SNF Pending bed offer, Ship broker  Expected Discharge Plan and Services Expected Discharge Plan: [place] In-house Referral: Clinical Social Work   Post Acute Care Choice: [place] Living arrangements for the past 2 months: Single Family Home                 DME Arranged: N/A                     Social Determinants of Health (SDOH) Interventions    Readmission Risk Interventions No flowsheet data found.

## 2020-07-28 NOTE — Plan of Care (Signed)

## 2020-07-28 NOTE — Progress Notes (Signed)
Occupational Therapy Treatment Patient Details Name: Breanna [name] MRN: [id number] DOB: [birth year] Today's Date: 07/28/2020    History of present illness 86 y.o. female with a past medical history of gout, essential hypertension, glaucoma, osteoarthritis, diabetes mellitus type 2, CVA, L TKR, R total shoulder, adrenal adenoma admitted to ED on 7/29 after fall, sustaining R hip femoral neck fracture. S/p R hip IM nail on 7/29.   OT comments  Treatment focused on functional mobility and improving independence with toileting. Patient transferred to [facility] with min assist and RW, required mod assist for toileting (patient able to wipe herself in sitting and standing) and able to ambulate 4 feet back to bed with verbal cues for technique. Patient requires increased time for transfers and tends to offload heavily to the left but improving. Cont POC.   Follow Up Recommendations  SNF;Supervision/Assistance - 24 hour    Equipment Recommendations  3 in 1 bedside commode    Recommendations for Other Services      Precautions / Restrictions Precautions Precautions: Fall Restrictions Weight Bearing Restrictions: No RLE Weight Bearing: Weight bearing as tolerated       Mobility Bed Mobility Overal bed mobility: Needs Assistance Bed Mobility: Sit to Supine       Sit to supine: Min assist   General bed mobility comments: Min assist for RLE to transfer to supine. Increased time needed.  Transfers Overall transfer level: Needs assistance Equipment used: Rolling walker (2 wheeled) Transfers: Sit to/from Stand Sit to Stand: Mod assist Stand pivot transfers: Min assist       General transfer comment: Patient able to rise from recliner and BSC with min assist. Needs increased time to perform but can do it with only min assist. Min assist and RW to take steps to BSc and then approx 4 feet back to bed - including taking steps backwards. Patient performs at her own speed - and required only min  assist for steadying as well as verbal cues for technique/walker management.    Balance Overall balance assessment: Needs assistance Sitting-balance support: Feet supported;Single extremity supported Sitting balance-Leahy Scale: Fair Sitting balance - Comments: Patient offloading heavily to the left and propping with arm due to pain.   Standing balance support: Bilateral upper extremity supported;During functional activity;Single extremity supported Standing balance-Leahy Scale: Poor Standing balance comment: Able to reduce to one upper extremity with toileting.                           ADL either performed or assessed with clinical judgement   ADL Overall ADL's : Needs assistance/impaired                         Toilet Transfer: Stand-pivot;RW;BSC Armed forces technical officer Details (indicate cue type and reason): Min assist to take steps and pivot to [facility] placed on her left with RW. Verbal cues for hand placement and technique. Patient off loading to the left due to pain. Toileting- Clothing Manipulation and Hygiene: Moderate assistance Toileting - Clothing Manipulation Details (indicate cue type and reason): Mod assist for toileting. Patient able to wipe in seated and standing position with therapist provided min assist for steadying and managing hospital gown.             Vision Baseline Vision/History: Wears glasses Wears Glasses: At all times Patient Visual Report: No change from baseline     Perception     Praxis  Cognition Arousal/Alertness: Awake/alert Behavior During Therapy: WFL for tasks assessed/performed Overall Cognitive Status: Within Functional Limits for tasks assessed                                          Exercises     Shoulder Instructions       General Comments      Pertinent Vitals/ Pain       Pain Assessment: 0-10 Pain Score: 2  Pain Location: R hip; pain increased with activity. Pain Descriptors /  Indicators: Grimacing;Guarding;Discomfort;Sore Pain Intervention(s): Monitored during session  Home Living                                          Prior Functioning/Environment              Frequency  Min 2X/week        Progress Toward Goals  OT Goals(current goals can now be found in the care plan section)  Progress towards OT goals: Progressing toward goals  Acute Rehab OT Goals Patient Stated Goal: get stronger at rehab OT Goal Formulation: With patient Time For Goal Achievement: 08/07/20 Potential to Achieve Goals: Good  Plan Discharge plan remains appropriate    Co-evaluation                 AM-PAC OT "6 Clicks" Daily Activity     Outcome Measure   Help from another person eating meals?: A Little Help from another person taking care of personal grooming?: A Little Help from another person toileting, which includes using toliet, bedpan, or urinal?: A Lot Help from another person bathing (including washing, rinsing, drying)?: A Lot Help from another person to put on and taking off regular upper body clothing?: A Little Help from another person to put on and taking off regular lower body clothing?: A Lot 6 Click Score: 15    End of Session Equipment Utilized During Treatment: Gait belt;Rolling walker  OT Visit Diagnosis: Unsteadiness on feet (R26.81);Other abnormalities of gait and mobility (R26.89);Muscle weakness (generalized) (M62.81);Pain Pain - Right/Left: Right Pain - part of body: Hip;Leg   Activity Tolerance Patient tolerated treatment well   Patient Left in bed;with call bell/phone within reach   Nurse Communication Mobility status        Time: 9563-8756 OT Time Calculation (min): 37 min  Charges: OT General Charges $OT Visit: 1 Visit OT Treatments $Self Care/Home Management : 23-37 mins  Rashod Gougeon, OTR/L [place]  Office [phone] Pager: [place] 07/28/2020, 4:39  PM

## 2020-07-29 DIAGNOSIS — S72001D Fracture of unspecified part of neck of right femur, subsequent encounter for closed fracture with routine healing: Secondary | ICD-10-CM | POA: Diagnosis not present

## 2020-07-29 DIAGNOSIS — M109 Gout, unspecified: Secondary | ICD-10-CM | POA: Diagnosis not present

## 2020-07-29 DIAGNOSIS — S72001A Fracture of unspecified part of neck of right femur, initial encounter for closed fracture: Secondary | ICD-10-CM | POA: Diagnosis not present

## 2020-07-29 DIAGNOSIS — S31000A Unspecified open wound of lower back and pelvis without penetration into retroperitoneum, initial encounter: Secondary | ICD-10-CM | POA: Diagnosis not present

## 2020-07-29 DIAGNOSIS — I10 Essential (primary) hypertension: Secondary | ICD-10-CM | POA: Diagnosis not present

## 2020-07-29 DIAGNOSIS — N183 Chronic kidney disease, stage 3 unspecified: Secondary | ICD-10-CM | POA: Diagnosis not present

## 2020-07-29 DIAGNOSIS — E1140 Type 2 diabetes mellitus with diabetic neuropathy, unspecified: Secondary | ICD-10-CM | POA: Diagnosis not present

## 2020-07-29 DIAGNOSIS — E785 Hyperlipidemia, unspecified: Secondary | ICD-10-CM | POA: Diagnosis not present

## 2020-07-29 DIAGNOSIS — Z4789 Encounter for other orthopedic aftercare: Secondary | ICD-10-CM | POA: Diagnosis not present

## 2020-07-29 DIAGNOSIS — E119 Type 2 diabetes mellitus without complications: Secondary | ICD-10-CM | POA: Diagnosis not present

## 2020-07-29 DIAGNOSIS — E1129 Type 2 diabetes mellitus with other diabetic kidney complication: Secondary | ICD-10-CM | POA: Diagnosis not present

## 2020-07-29 DIAGNOSIS — E1121 Type 2 diabetes mellitus with diabetic nephropathy: Secondary | ICD-10-CM | POA: Diagnosis not present

## 2020-07-29 DIAGNOSIS — R7989 Other specified abnormal findings of blood chemistry: Secondary | ICD-10-CM | POA: Diagnosis not present

## 2020-07-29 DIAGNOSIS — S72001S Fracture of unspecified part of neck of right femur, sequela: Secondary | ICD-10-CM | POA: Diagnosis not present

## 2020-07-29 DIAGNOSIS — M25551 Pain in right hip: Secondary | ICD-10-CM | POA: Diagnosis not present

## 2020-07-29 DIAGNOSIS — D649 Anemia, unspecified: Secondary | ICD-10-CM | POA: Diagnosis not present

## 2020-07-29 DIAGNOSIS — I6350 Cerebral infarction due to unspecified occlusion or stenosis of unspecified cerebral artery: Secondary | ICD-10-CM | POA: Diagnosis not present

## 2020-07-29 DIAGNOSIS — R001 Bradycardia, unspecified: Secondary | ICD-10-CM | POA: Diagnosis not present

## 2020-07-29 DIAGNOSIS — S72141D Displaced intertrochanteric fracture of right femur, subsequent encounter for closed fracture with routine healing: Secondary | ICD-10-CM | POA: Diagnosis not present

## 2020-07-29 LAB — GLUCOSE, CAPILLARY
Glucose-Capillary: 87 mg/dL (ref 70–99)
Glucose-Capillary: 109 mg/dL — ABNORMAL HIGH (ref 70–99)
Glucose-Capillary: 107 mg/dL — ABNORMAL HIGH (ref 70–99)
Glucose-Capillary: 106 mg/dL — ABNORMAL HIGH (ref 70–99)

## 2020-07-29 LAB — URINE CULTURE: Culture: >=100000 — AB

## 2020-07-29 MED ORDER — POLYETHYLENE GLYCOL 3350 17 G PO PACK: 17.0000 g | PACK | Freq: Every day | ORAL | 0 refills | Status: DC | PRN

## 2020-07-29 MED ORDER — ENSURE ENLIVE PO LIQD: 237.0000 mL | Freq: Every day | ORAL | 12 refills | Status: DC

## 2020-07-29 MED ORDER — CIPROFLOXACIN HCL 500 MG PO TABS
250.0000 mg | ORAL_TABLET | Freq: Two times a day (BID) | ORAL | Status: DC
  Administered 2020-07-29: 250 mg via ORAL
  Filled 2020-07-29: qty 1

## 2020-07-29 MED ORDER — ASPIRIN 81 MG PO TBEC: 81.0000 mg | DELAYED_RELEASE_TABLET | Freq: Two times a day (BID) | ORAL | 11 refills | Status: DC

## 2020-07-29 MED ORDER — CIPROFLOXACIN HCL 250 MG PO TABS: 250.0000 mg | ORAL_TABLET | Freq: Two times a day (BID) | ORAL | 0 refills | Status: DC

## 2020-07-29 MED ORDER — AMLODIPINE BESYLATE 10 MG PO TABS: 10.0000 mg | ORAL_TABLET | Freq: Every day | ORAL | Status: DC

## 2020-07-29 MED ORDER — GLUCERNA SHAKE PO LIQD: 237.0000 mL | Freq: Two times a day (BID) | ORAL | 0 refills | Status: DC

## 2020-07-29 MED ORDER — PIPERACILLIN-TAZOBACTAM 3.375 G IVPB
3.3750 g | Freq: Three times a day (TID) | INTRAVENOUS | Status: DC
  Administered 2020-07-29: 3.375 g via INTRAVENOUS
  Filled 2020-07-29: qty 50

## 2020-07-29 MED ORDER — ACETAMINOPHEN 325 MG PO TABS: 650.0000 mg | ORAL_TABLET | Freq: Four times a day (QID) | ORAL | Status: DC | PRN

## 2020-07-29 MED ORDER — SENNA 8.6 MG PO TABS: 1.0000 | ORAL_TABLET | Freq: Two times a day (BID) | ORAL | 0 refills | Status: DC

## 2020-07-29 MED ORDER — DOCUSATE SODIUM 100 MG PO CAPS: 100.0000 mg | ORAL_CAPSULE | Freq: Two times a day (BID) | ORAL | 0 refills | Status: DC

## 2020-07-29 NOTE — Plan of Care (Signed)
  Problem: Education: Goal: Knowledge of General Education information will improve Description: Including pain rating scale, medication(s)/side effects and non-pharmacologic comfort measures Outcome: Progressing   Problem: Health Behavior/Discharge Planning: Goal: Ability to manage health-related needs will improve Outcome: Progressing   Problem: Activity: Goal: Risk for activity intolerance will decrease Outcome: Progressing   Problem: Nutrition: Goal: Adequate nutrition will be maintained Outcome: Progressing   Problem: Elimination: Goal: Will not experience complications related to bowel motility Outcome: Progressing   Problem: Pain Managment: Goal: General experience of comfort will improve Outcome: Progressing   

## 2020-07-29 NOTE — TOC Transition Note (Signed)
Transition of Care [facility] - CM/SW Discharge Note   Patient Details  Name: Breanna [name] MRN: [id number] Date of Birth: 01-16-1931  Transition of [facility]) CM/SW Contact:  Curlene Labrum, RN Phone Number: 07/29/2020, 2:05 PM   Clinical Narrative:     Case management spoke with Dr. Kurtis Bushman and the patient is cleared medically to transfer to [place] facility today for Rehab.  Levada Dy at [facility] was contacted and the patient's primary RN, Audery Amel, will call report to [phone] for Room 209.  The son, [name] was called and a message was left with him to return my call to update him on the patient's transfer today.  The patient is aware.    Barriers to Discharge: Continued Medical Work up, SNF Pending bed offer, Insurance Authorization   Patient Goals and CMS Choice Patient states their goals for this hospitalization and ongoing recovery are:: Rehab CMS Medicare.gov Compare Post Acute Care list provided to:: Patient Choice offered to / list presented to : Patient  Discharge Placement                       Discharge Plan and Services In-house Referral: Clinical Social Work   Post Acute Care Choice: [place]          DME Arranged: N/A                    Social Determinants of Health (SDOH) Interventions     Readmission Risk Interventions No flowsheet data found.

## 2020-07-29 NOTE — Progress Notes (Signed)
Physical Therapy Treatment Patient Details Name: Breanna [name] MRN: [id number] DOB: Oct 28, 1931 Today's Date: 07/29/2020    History of Present Illness 85 y.o. female with a past medical history of gout, essential hypertension, glaucoma, osteoarthritis, diabetes mellitus type 2, CVA, L TKR, R total shoulder, adrenal adenoma admitted to ED on 7/29 after fall, sustaining R hip femoral neck fracture. S/p R hip IM nail on 7/29.    PT Comments    Pt in bed upon arrival of PT, agreeable to session with focus on progression of transfers and ambulation. The pt was able to demo improvements as she was able to complete multiple sit-stand transfers and stand-pivot transfers with minG only for safety but no physical assist. She continues to move at her own pace and benefit from VCs for safety, but was able to complete the movements and make adjustments on her own. She was also able to demo a short bout of ambulation with improved clearance of L and R foot for about 6 steps, but returned to technique of sliding her feet forward due to onset of fatigue and pain. The pt will continue to benefit from skilled PT to further progress functional strength and tolerance for mobility prior to return home.     Follow Up Recommendations  SNF;Supervision/Assistance - 24 hour     Equipment Recommendations  None recommended by PT    Recommendations for Other Services       Precautions / Restrictions Precautions Precautions: Fall Restrictions Weight Bearing Restrictions: Yes RLE Weight Bearing: Weight bearing as tolerated    Mobility  Bed Mobility Overal bed mobility: Needs Assistance Bed Mobility: Supine to Sit     Supine to sit: Min assist;HOB elevated     General bed mobility comments: minA to RLE only, pt asking for extra time and no assist to complete remainder of mobility. The pt was able to complete with significant extra time and heavy use of rail  Transfers Overall transfer level: Needs  assistance Equipment used: Rolling walker (2 wheeled) Transfers: Sit to/from [facility] Sit to Stand: Min guard Stand pivot transfers: Min guard       General transfer comment: pt able to rise from bed and recliner with minG for safety and significant extra time, but no physical assist to stand. The pt was then able to complete stand-pivot transfer to recliner with "wiggle" steps without any clearance all performed at her own speed. no LOB, ming G only  Ambulation/Gait Ambulation/Gait assistance: Min Web designer (Feet): 8 Feet Assistive device: Rolling walker (2 wheeled) Gait Pattern/deviations: Step-to pattern;Antalgic;Trunk flexed;Shuffle;Decreased stride length Gait velocity: very slow Gait velocity interpretation: <1.31 ft/sec, indicative of household ambulator General Gait Details: pt with strong L lateral lean, able to generate some clearance bilaterally with max extra time, cues, and encouragement for ~6 steps each foot. Other steps performed with "wiggling" her feet forward. does try to rest forearms on RW, would benefit from youth RW      Balance Overall balance assessment: Needs assistance Sitting-balance support: Feet supported;Single extremity supported Sitting balance-Leahy Scale: Fair Sitting balance - Comments: Patient offloading heavily to the left and propping with arm due to pain.   Standing balance support: Bilateral upper extremity supported;During functional activity;Single extremity supported Standing balance-Leahy Scale: Poor Standing balance comment: reliant on BUE support                            Cognition Arousal/Alertness: Awake/alert Behavior During Therapy:  WFL for tasks assessed/performed Overall Cognitive Status: Within Functional Limits for tasks assessed                                 General Comments: pt highly motivated to work with therapy this date      Exercises      General  Comments General comments (skin integrity, edema, etc.): VSS      Pertinent Vitals/Pain Pain Assessment: Faces Faces Pain Scale: Hurts whole lot Pain Location: R hip; pain increased with activity. Pain Descriptors / Indicators: Grimacing;Guarding;Discomfort;Sore Pain Intervention(s): Limited activity within patient's tolerance;Monitored during session;Patient requesting pain meds-RN notified;Repositioned           PT Goals (current goals can now be found in the care plan section) Acute Rehab PT Goals Patient Stated Goal: get stronger at rehab PT Goal Formulation: With patient Time For Goal Achievement: 08/05/20 Potential to Achieve Goals: Good Progress towards PT goals: Progressing toward goals    Frequency    Min 3X/week      PT Plan Current plan remains appropriate       AM-PAC PT "6 Clicks" Mobility   Outcome Measure  Help needed turning from your back to your side while in a flat bed without using bedrails?: A Little Help needed moving from lying on your back to sitting on the side of a flat bed without using bedrails?: A Little Help needed moving to and from a bed to a chair (including a wheelchair)?: A Lot Help needed standing up from a chair using your arms (e.g., wheelchair or bedside chair)?: A Little Help needed to walk in hospital room?: A Lot Help needed climbing 3-5 steps with a railing? : Total 6 Click Score: 14    End of Session Equipment Utilized During Treatment: Gait belt Activity Tolerance: Patient tolerated treatment well;Patient limited by pain;Patient limited by fatigue Patient left: in bed;with call bell/phone within reach;with bed alarm set Nurse Communication: Mobility status PT Visit Diagnosis: Other abnormalities of gait and mobility (R26.89);Muscle weakness (generalized) (M62.81);Difficulty in walking, not elsewhere classified (R26.2);Pain Pain - Right/Left: Right Pain - part of body: Hip     Time: 8916-9450 PT Time Calculation (min)  (ACUTE ONLY): 30 min  Charges:  $Gait Training: 23-37 mins                     Karma Ganja, PT, DPT   Acute Rehabilitation Department Pager #: [phone]   Otho Bellows 07/29/2020, 11:13 AM

## 2020-07-29 NOTE — Progress Notes (Addendum)
Pharmacy Antibiotic Note  Breanna [name] is a 86 y.o. female admitted on 07/20/2020 with femoral fracture after fall and found to have pseudomonal UTI.  Pharmacy has been consulted for Zosyn dosing.  Today, WBC WNL, patient afebrile with Tmax 99.9. Patient complained of lower and right back pain.  Plan: Zosyn 3.375 g IV q8h given over 4 hours Monitor clinical picture, renal function F/U C&S, abx de-escalation, LOT   Height: 5\' 4"  (162.6 cm) Weight: 64.9 kg (143 lb) IBW/kg (Calculated) : 54.7  Temp (24hrs), Avg:99.1 F (37.3 C), Min:98.4 F (36.9 C), Max:99.9 F (37.7 C)  Recent Labs  Lab 07/23/20 0802 07/23/20 0802 07/24/20 0349 07/25/20 0753 07/26/20 0300 07/27/20 0309 07/28/20 0340  WBC 12.5*   < > 10.2 9.0 8.2 9.9 9.5  CREATININE 1.95*  --  1.50* 1.28*  --  1.24* 1.09*   < > = values in this interval not displayed.    Estimated Creatinine Clearance: 32 mL/min (A) (by C-G formula based on SCr of 1.09 mg/dL (H)).    Allergies  Allergen Reactions  . Verapamil Shortness Of Breath    REACTION: SOB  . Aspirin Itching    But tolerates low dose  . Atenolol     REACTION: fatigue  . Codeine Itching  . Codeine Sulfate   . Hydrochlorothiazide     REACTION: gout  . Hydrocodone       side effects - hallucinations  . Ibuprofen     Upset stomach w/high doses  . Lisinopril     REACTION: cough  . Onion Other (See Comments)    Dry mouth/ gets sores  . Shellfish Allergy Swelling    Patient stated she does not eat shellfish, she swells up on different parts of the body  . Valsartan Itching  . Adhesive  [Tape] Rash    Antimicrobials this admission: Zosyn 8/6>  Microbiology results: 8/4 UCx: 100k pseudomonas    Brendolyn Patty, PharmD Clinical Pharmacist  07/29/2020   9:27 AM   Please check AMION for all [place] phone numbers After 10:00 PM, call the [facility] [phone]

## 2020-07-29 NOTE — Discharge Summary (Signed)
Breanna [name] IOX:[id number] DOB: 06/04/1931 DOA: 07/20/2020  PCP: Cassandria Anger, MD  Admit date: 07/20/2020 Discharge date: 07/29/2020  Admitted From: home Disposition:  SNF  Recommendations for Outpatient Follow-up:  1. Follow up with PCP in 1 week 2. Please obtain BMP/CBC in one week 3. Dr. Lara Mulch orthopedics in one week   Discharge Condition:Stable CODE STATUS: Full Diet recommendation: Heart Healthy  Brief/Interim Summary: Breanna [name] is a 86 y.o. female with a past medical history of gout, essential hypertension, glaucoma, history of osteoarthritis, diabetes mellitus type 2 who was in her usual state of health when she was with a friend and was watering her plants when the patient made a sudden turn to the right and then lost her balance and fell onto her right side and her arm.  Denies hitting her head against the ground.  Pain is currently 10 out of 10 in intensity.In the emergency department x-ray revealed fracture of the right femoral neck. She will be hospitalized for further management. She is noted to be bradycardic. She was noted to be on beta-blockers. Orthopedic was consulted.  1. Right hip fracture and low back pain:  -orthopedic surgery consulted -Pt now s/p surgery 7/29 -PT/OT -recommend SNF Minimize narcotics Keep dressing dry WBAT to RLE Follow-up with Ortho in 2 weeks Started on aspirin 81 twice daily for DVT prophylaxis   2. Sinus bradycardia:  -coreg discontinued -Her TSH was normal when checked earlier this month.  HR stable, asx. Continue to monitor.  3. Chronic kidney disease stage IIIb:  -Creatinine had risen to over 2 Improving, creatinine  1.09 Stable and continue to monitor.   4. Essential hypertension:  -At home she is on amlodipine, carvedilol, Cozaar.  -held Cozaar due to elevated creatinine and coreg per above for presenting bradycardia -continue Norvasc 10mg  qd. Continue to monitor.   5. Normocytic  anemia with acute blood loss anemia: Likely anemia of chronic disease.  -Hemoglobin initially noted to be near baseline prior to surgery -Recently required 2 units of PRBC's pos-operatively -Then hgb of 6.7. Had ordered 2 more units PRBC's H/h stable. Hg 9.1  Continue to monitor periodically   6. Thrombocytopenia:  -Platelet count with a low of 60 Improving, platelet 123  No signs of bleed. Continue to monitor.    7. Diabetes mellitus type 2, with renal complications with chronic kidney disease:  -On Metformin continue on discharge  HBA1C was 6.1 on July 06, 2020.  8. History of gout: Continue allopurinol as tolerated. Asx, stable.  9. Acute blood loss anemia -Suspect secondary to recent surgery. -Patient was made aware of her anemia, agreeable to blood transfusion -See above. Given 2 units earlier in this course -Given an additional 2 units for hgb of 6.7on 8/2 -No evidence of acute blood loss Hg 9.1, stable Continue to monitor  10. Fever-2/2 UTI.  ucx with pseudo. +.  Spoke to pharmacy pt can be started on ciprofloxacin x3 days. Should have repeat ucx as outpt upon completion of abx.  COvid test negative on 8/3  Discharge Diagnoses:  Active Problems:   Type 2 diabetes mellitus with renal manifestations, controlled (HCC)   Essential hypertension   Cerebral artery occlusion with cerebral infarction [facility])   Hip fracture (HCC)   Sinus bradycardia    Discharge Instructions  Discharge Instructions     Remove dressing in 72 hours   Complete by: As directed    Keep dressing dry   Call MD for:  temperature >100.4  Complete by: As directed    Diet - low sodium heart healthy   Complete by: As directed    Increase activity slowly   Complete by: As directed      Allergies as of 07/29/2020      Reactions   Verapamil Shortness Of Breath   REACTION: SOB   Aspirin Itching   But tolerates low dose   Atenolol    REACTION: fatigue   Codeine Itching    Codeine Sulfate    Hydrochlorothiazide    REACTION: gout   Hydrocodone     side effects - hallucinations   Ibuprofen    Upset stomach w/high doses   Lisinopril    REACTION: cough   Onion Other (See Comments)   Dry mouth/ gets sores   Shellfish Allergy Swelling   Patient stated she does not eat shellfish, she swells up on different parts of the body   Valsartan Itching   Adhesive  [tape] Rash      Medication List    STOP taking these medications   acetaminophen 650 MG CR tablet Commonly known as: TYLENOL Replaced by: acetaminophen 325 MG tablet   carvedilol 25 MG tablet Commonly known as: COREG   diphenhydramine-acetaminophen 25-500 MG Tabs tablet Commonly known as: TYLENOL PM   losartan 25 MG tablet Commonly known as: COZAAR   spironolactone 25 MG tablet Commonly known as: Aldactone   torsemide 20 MG tablet Commonly known as: DEMADEX     TAKE these medications   acetaminophen 325 MG tablet Commonly known as: TYLENOL Take 2 tablets (650 mg total) by mouth every 6 (six) hours as needed for moderate pain or fever. Replaces: acetaminophen 650 MG CR tablet   allopurinol 300 MG tablet Commonly known as: ZYLOPRIM Take 0.5 tablets (150 mg total) by mouth daily.   amLODipine 10 MG tablet Commonly known as: NORVASC Take 1 tablet (10 mg total) by mouth daily. Start taking on: July 30, 2020 What changed:   medication strength  how much to take   aspirin 81 MG EC tablet Take 1 tablet (81 mg total) by mouth 2 (two) times daily. Swallow whole. What changed:   when to take this  additional instructions   atropine 1 % ophthalmic solution Place 1 drop into the left eye daily as needed (Dry/irritated eyes).   CALCIUM PO Take 1 tablet by mouth daily.   ciprofloxacin 250 MG tablet Commonly known as: CIPRO Take 1 tablet (250 mg total) by mouth 2 (two) times daily for 3 days.   cyanocobalamin 2000 MCG tablet Take 2,000 mcg by mouth daily.   diclofenac sodium  1 % Gel Commonly known as: Voltaren Apply 2 g topically 2 (two) times daily.   docusate sodium 100 MG capsule Commonly known as: COLACE Take 1 capsule (100 mg total) by mouth 2 (two) times daily.   dorzolamide-timolol 22.3-6.8 MG/ML ophthalmic solution Commonly known as: COSOPT Place 1 drop into both eyes 2 (two) times daily.   feeding supplement (GLUCERNA SHAKE) Liqd Take 237 mLs by mouth 2 (two) times daily between meals.   feeding supplement (ENSURE ENLIVE) Liqd Take 237 mLs by mouth daily at 2 PM. Start taking on: July 30, 2020   gabapentin 100 MG capsule Commonly known as: NEURONTIN Take 1 capsule (100 mg total) by mouth 3 (three) times daily.   latanoprost 0.005 % ophthalmic solution Commonly known as: XALATAN Place 1 drop into both eyes at bedtime.   metFORMIN 500 MG tablet Commonly known as: GLUCOPHAGE Take  1 tablet (500 mg total) by mouth daily with breakfast.   NON FORMULARY Place 10 drops under the tongue daily as needed (Pain). CBD oil   pantoprazole 40 MG tablet Commonly known as: PROTONIX Take 1 tablet (40 mg total) by mouth daily.   polyethylene glycol 17 g packet Commonly known as: MIRALAX / GLYCOLAX Take 17 g by mouth daily as needed for mild constipation.   senna 8.6 MG Tabs tablet Commonly known as: SENOKOT Take 1 tablet (8.6 mg total) by mouth 2 (two) times daily.   sertraline 100 MG tablet Commonly known as: ZOLOFT Take 1 tablet (100 mg total) by mouth daily.   Vitamin D3 50 MCG (2000 UT) capsule Take 2,000 Units by mouth daily.       Follow-up Information    Renette Butters, MD In 2 weeks.   Specialty: Orthopedic Surgery Contact information: [address] [place] [address] [phone]        Renette Butters, MD In 2 weeks.   Specialty: Orthopedic Surgery Contact information: [address] [place] [address] [phone]        Plotnikov, Evie Lacks, MD Follow up in 1  week(s).   Specialty: Internal Medicine Contact information: [place] [address] [phone]              Allergies  Allergen Reactions  . Verapamil Shortness Of Breath    REACTION: SOB  . Aspirin Itching    But tolerates low dose  . Atenolol     REACTION: fatigue  . Codeine Itching  . Codeine Sulfate   . Hydrochlorothiazide     REACTION: gout  . Hydrocodone       side effects - hallucinations  . Ibuprofen     Upset stomach w/high doses  . Lisinopril     REACTION: cough  . Onion Other (See Comments)    Dry mouth/ gets sores  . Shellfish Allergy Swelling    Patient stated she does not eat shellfish, she swells up on different parts of the body  . Valsartan Itching  . Adhesive  [Tape] Rash    Consultations:   orthopedics  Procedures/Studies: DG Chest 1 View  Result Date: 07/20/2020 CLINICAL DATA:  Recent fall with chest pain, initial encounter EXAM: CHEST  1 VIEW COMPARISON:  08/13/2017 FINDINGS: Cardiac shadow is enlarged accentuated by the frontal technique. The lungs are well aerated without focal infiltrate or sizable effusion. No acute bony abnormality is noted. IMPRESSION: No acute abnormality seen. Electronically Signed   By: Inez Catalina M.D.   On: 07/20/2020 21:51   DG Lumbar Spine 2-3 Views  Result Date: 07/21/2020 CLINICAL DATA:  Fall and back pain EXAM: LUMBAR SPINE - 2-3 VIEW COMPARISON:  June 04, 2016 FINDINGS: The patient is status post lumbar spine fixation from L3 through L5 with posterior spinal fixation hardware. No periprosthetic lucency or fracture is identified. Disc height loss with facet arthrosis is most notable at L5-S1. There is diffuse osteopenia. IMPRESSION: Status post lumbar spine fixation from L3 through L5 without complication. No definite acute fracture or malalignment. Electronically Signed   By: Prudencio Pair M.D.   On: 07/21/2020 01:15   DG Elbow Complete Right  Result Date: 07/20/2020 CLINICAL DATA:   Recent fall with elbow pain, initial encounter EXAM: RIGHT ELBOW - COMPLETE 3+ VIEW COMPARISON:  None. FINDINGS: No acute fracture or dislocation is noted. Mild olecranon spurring is seen. No soft tissue abnormality  is noted. IMPRESSION: No acute abnormality noted. Electronically Signed   By: Inez Catalina M.D.   On: 07/20/2020 21:50   CT Hip Right Wo Contrast  Result Date: 07/21/2020 CLINICAL DATA:  Fall fractured hip EXAM: CT OF THE RIGHT HIP WITHOUT CONTRAST TECHNIQUE: Multidetector CT imaging of the right hip was performed according to the standard protocol. Multiplanar CT image reconstructions were also generated. COMPARISON:  None. FINDINGS: Bones/Joint/Cartilage There is a comminuted mildly displaced fracture involving the right intratrochanteric region. The fracture line extends through the greater trochanter with a mildly displaced lesser trochanter. The femoral head is slightly rotated, however still well seated within the acetabulum. There is diffuse osteopenia noted. No other definite fracture is seen. Ligaments Suboptimally assessed by CT. Muscles and Tendons The muscles surrounding the hip appear to be grossly intact. The tendons appear to be intact. Soft tissues Mild soft tissue edema seen over the lateral aspect of the hip. The visualized deep pelvis is grossly unremarkable. IMPRESSION: Comminuted proximal right intratrochanteric femur fracture with mild displacement of the lesser trochanter. Electronically Signed   By: Prudencio Pair M.D.   On: 07/21/2020 00:25   DG CHEST PORT 1 VIEW  Result Date: 07/26/2020 CLINICAL DATA:  Fever EXAM: PORTABLE CHEST 1 VIEW COMPARISON:  July 20, 2020 FINDINGS: Lungs are clear. Heart is upper normal in size with pulmonary vascularity normal. No adenopathy. No bone lesions. IMPRESSION: Lungs clear.  Heart upper normal in size. Electronically Signed   By: Lowella Grip III M.D.   On: 07/26/2020 15:51   DG C-Arm 1-60 Min  Result Date: 07/21/2020 CLINICAL  DATA:  Right hip ORIF EXAM: RIGHT FEMUR 2 VIEWS; DG C-ARM 1-60 MIN COMPARISON:  X-ray 07/20/2020 FINDINGS: 3 C-arm fluoroscopic images were obtained intraoperatively and submitted for post operative interpretation. Interval placement of long IM rod with proximal lag screw traversing intertrochanteric fracture of the right femur with good anatomic alignment. 37 seconds of fluoroscopy time was utilized. Please see the performing provider's procedural report for further detail. IMPRESSION: As above. Electronically Signed   By: Davina Poke D.O.   On: 07/21/2020 13:41   DG Hip Unilat With Pelvis 2-3 Views Right  Result Date: 07/20/2020 CLINICAL DATA:  86 year old female with fall. EXAM: DG HIP (WITH OR WITHOUT PELVIS) 2-3V RIGHT COMPARISON:  None. FINDINGS: Evaluation is limited due to body habitus and osteopenia. There is a displaced fracture of the lesser trochanter of the right femur. There is a linear lucency through the base of the right femoral neck most consistent with a nondisplaced basicervical fracture. Further evaluation with CT is recommended. There is no dislocation. Partially visualized lower lumbar fusion hardware. Multiple surgical wires noted over the pelvis. The soft tissues are unremarkable. IMPRESSION: Nondisplaced basicervical fracture of the right femoral neck with displaced fracture of the lesser trochanter. Further evaluation with CT recommended. Electronically Signed   By: Anner Crete M.D.   On: 07/20/2020 21:50   DG FEMUR, MIN 2 VIEWS RIGHT  Result Date: 07/21/2020 CLINICAL DATA:  Right hip ORIF EXAM: RIGHT FEMUR 2 VIEWS; DG C-ARM 1-60 MIN COMPARISON:  X-ray 07/20/2020 FINDINGS: 3 C-arm fluoroscopic images were obtained intraoperatively and submitted for post operative interpretation. Interval placement of long IM rod with proximal lag screw traversing intertrochanteric fracture of the right femur with good anatomic alignment. 37 seconds of fluoroscopy time was utilized. Please  see the performing provider's procedural report for further detail. IMPRESSION: As above. Electronically Signed   By: Davina Poke D.O.  On: 07/21/2020 13:41      Subjective: No complaints  Discharge Exam: Vitals:   07/29/20 0433 07/29/20 0758  BP: (!) 151/62 (!) 145/64  Pulse: 62 60  Resp: 16 16  Temp: 98.4 F (36.9 C) 99 F (37.2 C)  SpO2: 97% 100%   Vitals:   07/28/20 1550 07/28/20 1959 07/29/20 0433 07/29/20 0758  BP: (!) 150/69 (!) 142/53 (!) 151/62 (!) 145/64  Pulse: 68 66 62 60  Resp: 17 17 16 16   Temp: 99 F (37.2 C) 99.9 F (37.7 C) 98.4 F (36.9 C) 99 F (37.2 C)  TempSrc: Oral Oral Oral Oral  SpO2: 97% 98% 97% 100%  Weight:      Height:        General: Pt is alert, awake, not in acute distress Cardiovascular: RRR, S1/S2 +, no rubs, no gallops Respiratory: CTA bilaterally, no wheezing, no rhonchi Abdominal: Soft, NT, ND, bowel sounds + Extremities: no edema, no cyanosis    The results of significant diagnostics from this hospitalization (including imaging, microbiology, ancillary and laboratory) are listed below for reference.     Microbiology: Recent Results (from the past 240 hour(s))  SARS Coronavirus 2 by RT PCR (hospital order, performed in [facility] hospital lab) Nasopharyngeal Nasopharyngeal Swab     Status: None   Collection Time: 07/20/20 10:18 PM   Specimen: Nasopharyngeal Swab  Result Value Ref Range Status   SARS Coronavirus 2 NEGATIVE NEGATIVE Final    Comment: (NOTE) SARS-CoV-2 target nucleic acids are NOT DETECTED.  The SARS-CoV-2 RNA is generally detectable in upper and lower respiratory specimens during the acute phase of infection. The lowest concentration of SARS-CoV-2 viral copies this assay can detect is 250 copies / mL. A negative result does not preclude SARS-CoV-2 infection and should not be used as the sole basis for treatment or other patient management decisions.  A negative result may occur with improper  specimen collection / handling, submission of specimen other than nasopharyngeal swab, presence of viral mutation(s) within the areas targeted by this assay, and inadequate number of viral copies (<250 copies / mL). A negative result must be combined with clinical observations, patient history, and epidemiological information.  Fact Sheet for Patients:   StrictlyIdeas.no  Fact Sheet for Healthcare Providers: BankingDealers.co.za  This test is not yet approved or  cleared by the [place] FDA and has been authorized for detection and/or diagnosis of SARS-CoV-2 by FDA under an Emergency Use Authorization (EUA).  This EUA will remain in effect (meaning this test can be used) for the duration of the COVID-19 declaration under Section 564(b)(1) of the Act, 21 U.S.C. section 360bbb-3(b)(1), unless the authorization is terminated or revoked sooner.  Performed at [facility], [place] [address] [place], [place] [address]   Surgical pcr screen     Status: None   Collection Time: 07/21/20  5:36 AM   Specimen: Nasal Mucosa; Nasal Swab  Result Value Ref Range Status   MRSA, PCR NEGATIVE NEGATIVE Final   Staphylococcus aureus NEGATIVE NEGATIVE Final    Comment: (NOTE) The Xpert SA Assay (FDA approved for NASAL specimens in patients 21 years of age and older), is one component of a comprehensive surveillance program. It is not intended to diagnose infection nor to guide or monitor treatment. Performed at [facility], [place] [address], [place], [place] [address]   SARS Coronavirus 2 by RT PCR (hospital order, performed in [facility] hospital lab) Nasopharyngeal Nasopharyngeal Swab  Status: None   Collection Time: 07/26/20  3:30 PM   Specimen: Nasopharyngeal Swab  Result Value Ref Range Status   SARS Coronavirus 2 NEGATIVE NEGATIVE Final    Comment: (NOTE) SARS-CoV-2 target nucleic acids are NOT DETECTED.  The SARS-CoV-2 RNA  is generally detectable in upper and lower respiratory specimens during the acute phase of infection. The lowest concentration of SARS-CoV-2 viral copies this assay can detect is 250 copies / mL. A negative result does not preclude SARS-CoV-2 infection and should not be used as the sole basis for treatment or other patient management decisions.  A negative result may occur with improper specimen collection / handling, submission of specimen other than nasopharyngeal swab, presence of viral mutation(s) within the areas targeted by this assay, and inadequate number of viral copies (<250 copies / mL). A negative result must be combined with clinical observations, patient history, and epidemiological information.  Fact Sheet for Patients:   StrictlyIdeas.no  Fact Sheet for Healthcare Providers: BankingDealers.co.za  This test is not yet approved or  cleared by the [place] FDA and has been authorized for detection and/or diagnosis of SARS-CoV-2 by FDA under an Emergency Use Authorization (EUA).  This EUA will remain in effect (meaning this test can be used) for the duration of the COVID-19 declaration under Section 564(b)(1) of the Act, 21 U.S.C. section 360bbb-3(b)(1), unless the authorization is terminated or revoked sooner.  Performed at [facility], [place] [address] [place], [place] [address]   Culture, Urine     Status: Abnormal   Collection Time: 07/27/20  4:41 PM   Specimen: Urine, Catheterized  Result Value Ref Range Status   Specimen Description URINE, CATHETERIZED  Final   Special Requests   Final    NONE Performed at [facility], [place] [address], [place], [place] [address]    Culture >=100,000 COLONIES/mL PSEUDOMONAS AERUGINOSA (A)  Final   Report Status 07/29/2020 FINAL  Final   Organism ID, Bacteria PSEUDOMONAS AERUGINOSA (A)  Final      Susceptibility   Pseudomonas aeruginosa - MIC*    CEFTAZIDIME 4  SENSITIVE Sensitive     CIPROFLOXACIN <=0.25 SENSITIVE Sensitive     GENTAMICIN <=1 SENSITIVE Sensitive     IMIPENEM 2 SENSITIVE Sensitive     PIP/TAZO <=4 SENSITIVE Sensitive     CEFEPIME 1 SENSITIVE Sensitive     * >=100,000 COLONIES/mL PSEUDOMONAS AERUGINOSA     Labs: BNP (last 3 results) No results for input(s): BNP in the last 8760 hours. Basic Metabolic Panel: Recent Labs  Lab 07/23/20 0802 07/24/20 0349 07/25/20 0753 07/27/20 0309 07/28/20 0340  NA 140 139 139 139  --   K 4.6 4.3 4.5 4.4  --   CL 111 111 108 108  --   CO2 21* 22 23 23   --   GLUCOSE 128* 111* 108* 119*  --   BUN 43* 35* 26* 22  --   CREATININE 1.95* 1.50* 1.28* 1.24* 1.09*  CALCIUM 7.9* 8.1* 8.4* 8.4*  --    Liver Function Tests: Recent Labs  Lab 07/23/20 0802 07/24/20 0349 07/25/20 0753 07/27/20 0309  AST 18 17 17 22   ALT 6 5 6 9   ALKPHOS 45 49 46 56  BILITOT 0.9 0.3 0.5 1.3*  PROT 5.1* 4.9* 4.8* 5.3*  ALBUMIN 2.4* 2.3* 2.2* 2.3*   No results for input(s): LIPASE, AMYLASE in the last 168 hours. No results for input(s): AMMONIA in the last 168 hours. CBC: Recent Labs  Lab 07/24/20 0349 07/25/20 0753 07/26/20 0300 07/27/20 0309 07/28/20 0340  WBC 10.2 9.0 8.2 9.9 9.5  HGB 7.2* 6.7* 8.7* 9.1* 9.1*  HCT 21.8* 20.5* 26.1* 27.4* 27.5*  MCV 86.5 88.4 87.3 88.1 88.7  PLT 64* 83* 80* 120* 123*   Cardiac Enzymes: No results for input(s): CKTOTAL, CKMB, CKMBINDEX, TROPONINI in the last 168 hours. BNP: Invalid input(s): POCBNP CBG: Recent Labs  Lab 07/28/20 1155 07/28/20 1645 07/28/20 2001 07/29/20 0631 07/29/20 1124  GLUCAP 93 124* 146* 87 109*   D-Dimer No results for input(s): DDIMER in the last 72 hours. Hgb A1c No results for input(s): HGBA1C in the last 72 hours. Lipid Profile No results for input(s): CHOL, HDL, LDLCALC, TRIG, CHOLHDL, LDLDIRECT in the last 72 hours. Thyroid function studies No results for input(s): TSH, T4TOTAL, T3FREE, THYROIDAB in the last 72  hours.  Invalid input(s): FREET3 Anemia work up No results for input(s): VITAMINB12, FOLATE, FERRITIN, TIBC, IRON, RETICCTPCT in the last 72 hours. Urinalysis    Component Value Date/Time   COLORURINE YELLOW 07/27/2020 1641   APPEARANCEUR CLEAR 07/27/2020 1641   LABSPEC 1.014 07/27/2020 1641   PHURINE 7.0 07/27/2020 1641   GLUCOSEU NEGATIVE 07/27/2020 1641   GLUCOSEU NEGATIVE 04/04/2018 1223   HGBUR NEGATIVE 07/27/2020 1641   BILIRUBINUR NEGATIVE 07/27/2020 1641   KETONESUR NEGATIVE 07/27/2020 1641   PROTEINUR 30 (A) 07/27/2020 1641   UROBILINOGEN 0.2 04/04/2018 1223   NITRITE NEGATIVE 07/27/2020 1641   LEUKOCYTESUR NEGATIVE 07/27/2020 1641   Sepsis Labs Invalid input(s): PROCALCITONIN,  WBC,  LACTICIDVEN Microbiology Recent Results (from the past 240 hour(s))  SARS Coronavirus 2 by RT PCR (hospital order, performed in [place] hospital lab) Nasopharyngeal Nasopharyngeal Swab     Status: None   Collection Time: 07/20/20 10:18 PM   Specimen: Nasopharyngeal Swab  Result Value Ref Range Status   SARS Coronavirus 2 NEGATIVE NEGATIVE Final    Comment: (NOTE) SARS-CoV-2 target nucleic acids are NOT DETECTED.  The SARS-CoV-2 RNA is generally detectable in upper and lower respiratory specimens during the acute phase of infection. The lowest concentration of SARS-CoV-2 viral copies this assay can detect is 250 copies / mL. A negative result does not preclude SARS-CoV-2 infection and should not be used as the sole basis for treatment or other patient management decisions.  A negative result may occur with improper specimen collection / handling, submission of specimen other than nasopharyngeal swab, presence of viral mutation(s) within the areas targeted by this assay, and inadequate number of viral copies (<250 copies / mL). A negative result must be combined with clinical observations, patient history, and epidemiological information.  Fact Sheet for Patients:    StrictlyIdeas.no  Fact Sheet for Healthcare Providers: BankingDealers.co.za  This test is not yet approved or  cleared by the [place] FDA and has been authorized for detection and/or diagnosis of SARS-CoV-2 by FDA under an Emergency Use Authorization (EUA).  This EUA will remain in effect (meaning this test can be used) for the duration of the COVID-19 declaration under Section 564(b)(1) of the Act, 21 U.S.C. section 360bbb-3(b)(1), unless the authorization is terminated or revoked sooner.  Performed at [facility], [place] [address], [place], [place] [address]   Surgical pcr screen     Status: None   Collection Time: 07/21/20  5:36 AM   Specimen: Nasal Mucosa; Nasal Swab  Result Value Ref Range Status   MRSA, PCR NEGATIVE NEGATIVE Final   Staphylococcus aureus NEGATIVE NEGATIVE Final  Comment: (NOTE) The Xpert SA Assay (FDA approved for NASAL specimens in patients 57 years of age and older), is one component of a comprehensive surveillance program. It is not intended to diagnose infection nor to guide or monitor treatment. Performed at [facility], [place] [address], [place], [place] [address]   SARS Coronavirus 2 by RT PCR (hospital order, performed in [facility] [facility] hospital lab) Nasopharyngeal Nasopharyngeal Swab     Status: None   Collection Time: 07/26/20  3:30 PM   Specimen: Nasopharyngeal Swab  Result Value Ref Range Status   SARS Coronavirus 2 NEGATIVE NEGATIVE Final    Comment: (NOTE) SARS-CoV-2 target nucleic acids are NOT DETECTED.  The SARS-CoV-2 RNA is generally detectable in upper and lower respiratory specimens during the acute phase of infection. The lowest concentration of SARS-CoV-2 viral copies this assay can detect is 250 copies / mL. A negative result does not preclude SARS-CoV-2 infection and should not be used as the sole basis for treatment or other patient management decisions.  A  negative result may occur with improper specimen collection / handling, submission of specimen other than nasopharyngeal swab, presence of viral mutation(s) within the areas targeted by this assay, and inadequate number of viral copies (<250 copies / mL). A negative result must be combined with clinical observations, patient history, and epidemiological information.  Fact Sheet for Patients:   StrictlyIdeas.no  Fact Sheet for Healthcare Providers: BankingDealers.co.za  This test is not yet approved or  cleared by the [place] FDA and has been authorized for detection and/or diagnosis of SARS-CoV-2 by FDA under an Emergency Use Authorization (EUA).  This EUA will remain in effect (meaning this test can be used) for the duration of the COVID-19 declaration under Section 564(b)(1) of the Act, 21 U.S.C. section 360bbb-3(b)(1), unless the authorization is terminated or revoked sooner.  Performed at [facility], [place] [address] [place], [place] [address]   Culture, Urine     Status: Abnormal   Collection Time: 07/27/20  4:41 PM   Specimen: Urine, Catheterized  Result Value Ref Range Status   Specimen Description URINE, CATHETERIZED  Final   Special Requests   Final    NONE Performed at [facility], [place] [address] [place], [place] [address]    Culture >=100,000 COLONIES/mL PSEUDOMONAS AERUGINOSA (A)  Final   Report Status 07/29/2020 FINAL  Final   Organism ID, Bacteria PSEUDOMONAS AERUGINOSA (A)  Final      Susceptibility   Pseudomonas aeruginosa - MIC*    CEFTAZIDIME 4 SENSITIVE Sensitive     CIPROFLOXACIN <=0.25 SENSITIVE Sensitive     GENTAMICIN <=1 SENSITIVE Sensitive     IMIPENEM 2 SENSITIVE Sensitive     PIP/TAZO <=4 SENSITIVE Sensitive     CEFEPIME 1 SENSITIVE Sensitive     * >=100,000 COLONIES/mL PSEUDOMONAS AERUGINOSA     Time coordinating discharge: Over 30 minutes  SIGNED:   Nolberto Hanlon,  MD  [facility] 07/29/2020, 1:49 PM Pager   If 7PM-7AM, please contact night-coverage [email or web address] Password [email or web address]

## 2020-07-29 NOTE — Progress Notes (Addendum)
Report given to [facility] at [facility] facility.all questions and concerns were fully answered.Per case manager, pt is no 7 on the list for pick up.

## 2020-08-08 DIAGNOSIS — E1140 Type 2 diabetes mellitus with diabetic neuropathy, unspecified: Secondary | ICD-10-CM | POA: Diagnosis not present

## 2020-08-08 DIAGNOSIS — I10 Essential (primary) hypertension: Secondary | ICD-10-CM | POA: Diagnosis not present

## 2020-08-08 DIAGNOSIS — E785 Hyperlipidemia, unspecified: Secondary | ICD-10-CM | POA: Diagnosis not present

## 2020-08-08 DIAGNOSIS — S72001A Fracture of unspecified part of neck of right femur, initial encounter for closed fracture: Secondary | ICD-10-CM | POA: Diagnosis not present

## 2020-08-10 DIAGNOSIS — S31000A Unspecified open wound of lower back and pelvis without penetration into retroperitoneum, initial encounter: Secondary | ICD-10-CM | POA: Diagnosis not present

## 2020-08-10 DIAGNOSIS — S72141D Displaced intertrochanteric fracture of right femur, subsequent encounter for closed fracture with routine healing: Secondary | ICD-10-CM | POA: Diagnosis not present

## 2020-08-14 DIAGNOSIS — E1140 Type 2 diabetes mellitus with diabetic neuropathy, unspecified: Secondary | ICD-10-CM | POA: Diagnosis not present

## 2020-08-14 DIAGNOSIS — E785 Hyperlipidemia, unspecified: Secondary | ICD-10-CM | POA: Diagnosis not present

## 2020-08-14 DIAGNOSIS — I10 Essential (primary) hypertension: Secondary | ICD-10-CM | POA: Diagnosis not present

## 2020-08-14 DIAGNOSIS — S72001A Fracture of unspecified part of neck of right femur, initial encounter for closed fracture: Secondary | ICD-10-CM | POA: Diagnosis not present

## 2020-08-16 DIAGNOSIS — S72141D Displaced intertrochanteric fracture of right femur, subsequent encounter for closed fracture with routine healing: Secondary | ICD-10-CM | POA: Diagnosis not present

## 2020-08-16 DIAGNOSIS — E1122 Type 2 diabetes mellitus with diabetic chronic kidney disease: Secondary | ICD-10-CM | POA: Diagnosis not present

## 2020-08-16 DIAGNOSIS — D631 Anemia in chronic kidney disease: Secondary | ICD-10-CM | POA: Diagnosis not present

## 2020-08-16 DIAGNOSIS — M1030 Gout due to renal impairment, unspecified site: Secondary | ICD-10-CM | POA: Diagnosis not present

## 2020-08-16 DIAGNOSIS — S72121D Displaced fracture of lesser trochanter of right femur, subsequent encounter for closed fracture with routine healing: Secondary | ICD-10-CM | POA: Diagnosis not present

## 2020-08-16 DIAGNOSIS — N1832 Chronic kidney disease, stage 3b: Secondary | ICD-10-CM | POA: Diagnosis not present

## 2020-08-16 DIAGNOSIS — I129 Hypertensive chronic kidney disease with stage 1 through stage 4 chronic kidney disease, or unspecified chronic kidney disease: Secondary | ICD-10-CM | POA: Diagnosis not present

## 2020-08-16 DIAGNOSIS — I272 Pulmonary hypertension, unspecified: Secondary | ICD-10-CM | POA: Diagnosis not present

## 2020-08-16 DIAGNOSIS — L89152 Pressure ulcer of sacral region, stage 2: Secondary | ICD-10-CM | POA: Diagnosis not present

## 2020-08-17 ENCOUNTER — Other Ambulatory Visit: Payer: Self-pay

## 2020-08-17 DIAGNOSIS — S72141D Displaced intertrochanteric fracture of right femur, subsequent encounter for closed fracture with routine healing: Secondary | ICD-10-CM | POA: Diagnosis not present

## 2020-08-17 DIAGNOSIS — N1832 Chronic kidney disease, stage 3b: Secondary | ICD-10-CM | POA: Diagnosis not present

## 2020-08-17 DIAGNOSIS — I272 Pulmonary hypertension, unspecified: Secondary | ICD-10-CM | POA: Diagnosis not present

## 2020-08-17 DIAGNOSIS — M1030 Gout due to renal impairment, unspecified site: Secondary | ICD-10-CM | POA: Diagnosis not present

## 2020-08-17 DIAGNOSIS — E1122 Type 2 diabetes mellitus with diabetic chronic kidney disease: Secondary | ICD-10-CM | POA: Diagnosis not present

## 2020-08-17 DIAGNOSIS — D631 Anemia in chronic kidney disease: Secondary | ICD-10-CM | POA: Diagnosis not present

## 2020-08-17 DIAGNOSIS — I129 Hypertensive chronic kidney disease with stage 1 through stage 4 chronic kidney disease, or unspecified chronic kidney disease: Secondary | ICD-10-CM | POA: Diagnosis not present

## 2020-08-17 DIAGNOSIS — L89152 Pressure ulcer of sacral region, stage 2: Secondary | ICD-10-CM | POA: Diagnosis not present

## 2020-08-17 DIAGNOSIS — S72121D Displaced fracture of lesser trochanter of right femur, subsequent encounter for closed fracture with routine healing: Secondary | ICD-10-CM | POA: Diagnosis not present

## 2020-08-17 NOTE — Patient Outreach (Signed)
[facility]) Care Management  08/17/2020  Breanna [name] 1931/07/25 [id number]   EMMI- General Discharge RED ON EMMI ALERT Day # 1 Date: 08/16/20 Red Alert Reason:  Read discharge papers? No  New prescriptions? I Don't Know    Patient currently admitted to [facility] for rehab.  RN CM will close case and have EMMI calls stopped.   Jone Baseman, RN, MSN [facility] Care Management Care Management Coordinator Direct Line [phone] Toll Free: [phone]  Fax: [phone]

## 2020-08-18 ENCOUNTER — Encounter: Payer: Self-pay | Admitting: Internal Medicine

## 2020-08-18 ENCOUNTER — Ambulatory Visit (INDEPENDENT_AMBULATORY_CARE_PROVIDER_SITE_OTHER): Payer: Medicare PPO | Admitting: Internal Medicine

## 2020-08-18 ENCOUNTER — Telehealth: Payer: Self-pay | Admitting: Internal Medicine

## 2020-08-18 ENCOUNTER — Other Ambulatory Visit: Payer: Self-pay

## 2020-08-18 DIAGNOSIS — I10 Essential (primary) hypertension: Secondary | ICD-10-CM

## 2020-08-18 DIAGNOSIS — E1122 Type 2 diabetes mellitus with diabetic chronic kidney disease: Secondary | ICD-10-CM | POA: Diagnosis not present

## 2020-08-18 DIAGNOSIS — K5901 Slow transit constipation: Secondary | ICD-10-CM | POA: Diagnosis not present

## 2020-08-18 DIAGNOSIS — E1121 Type 2 diabetes mellitus with diabetic nephropathy: Secondary | ICD-10-CM | POA: Diagnosis not present

## 2020-08-18 DIAGNOSIS — M25559 Pain in unspecified hip: Secondary | ICD-10-CM | POA: Diagnosis not present

## 2020-08-18 DIAGNOSIS — D631 Anemia in chronic kidney disease: Secondary | ICD-10-CM | POA: Diagnosis not present

## 2020-08-18 DIAGNOSIS — I272 Pulmonary hypertension, unspecified: Secondary | ICD-10-CM | POA: Diagnosis not present

## 2020-08-18 DIAGNOSIS — R634 Abnormal weight loss: Secondary | ICD-10-CM | POA: Diagnosis not present

## 2020-08-18 DIAGNOSIS — S72121D Displaced fracture of lesser trochanter of right femur, subsequent encounter for closed fracture with routine healing: Secondary | ICD-10-CM | POA: Diagnosis not present

## 2020-08-18 DIAGNOSIS — N1832 Chronic kidney disease, stage 3b: Secondary | ICD-10-CM | POA: Diagnosis not present

## 2020-08-18 DIAGNOSIS — S72001S Fracture of unspecified part of neck of right femur, sequela: Secondary | ICD-10-CM

## 2020-08-18 DIAGNOSIS — M1030 Gout due to renal impairment, unspecified site: Secondary | ICD-10-CM | POA: Diagnosis not present

## 2020-08-18 DIAGNOSIS — I129 Hypertensive chronic kidney disease with stage 1 through stage 4 chronic kidney disease, or unspecified chronic kidney disease: Secondary | ICD-10-CM | POA: Diagnosis not present

## 2020-08-18 DIAGNOSIS — D649 Anemia, unspecified: Secondary | ICD-10-CM | POA: Insufficient documentation

## 2020-08-18 DIAGNOSIS — S72141D Displaced intertrochanteric fracture of right femur, subsequent encounter for closed fracture with routine healing: Secondary | ICD-10-CM | POA: Diagnosis not present

## 2020-08-18 DIAGNOSIS — L89152 Pressure ulcer of sacral region, stage 2: Secondary | ICD-10-CM | POA: Diagnosis not present

## 2020-08-18 DIAGNOSIS — K5900 Constipation, unspecified: Secondary | ICD-10-CM | POA: Insufficient documentation

## 2020-08-18 LAB — CBC WITH DIFFERENTIAL/PLATELET
Absolute Monocytes: 601 cells/uL (ref 200–950)
Basophils Absolute: 40 cells/uL (ref 0–200)
Basophils Relative: 0.6 %
Eosinophils Absolute: 218 cells/uL (ref 15–500)
Eosinophils Relative: 3.3 %
HCT: 34.4 % — ABNORMAL LOW (ref 35.0–45.0)
Hemoglobin: 10.8 g/dL — ABNORMAL LOW (ref 11.7–15.5)
Lymphs Abs: 1208 cells/uL (ref 850–3900)
MCH: 29.3 pg (ref 27.0–33.0)
MCHC: 31.4 g/dL — ABNORMAL LOW (ref 32.0–36.0)
MCV: 93.2 fL (ref 80.0–100.0)
MPV: 12.3 fL (ref 7.5–12.5)
Monocytes Relative: 9.1 %
Neutro Abs: 4534 cells/uL (ref 1500–7800)
Neutrophils Relative %: 68.7 %
Platelets: 178 10*3/uL (ref 140–400)
RBC: 3.69 10*6/uL — ABNORMAL LOW (ref 3.80–5.10)
RDW: 15.1 % — ABNORMAL HIGH (ref 11.0–15.0)
Total Lymphocyte: 18.3 %
WBC: 6.6 10*3/uL (ref 3.8–10.8)

## 2020-08-18 MED ORDER — DOCUSATE SODIUM 100 MG PO CAPS: 100.0000 mg | ORAL_CAPSULE | Freq: Two times a day (BID) | ORAL | 11 refills | Status: DC

## 2020-08-18 NOTE — Assessment & Plan Note (Signed)
post-op  CBC

## 2020-08-18 NOTE — Assessment & Plan Note (Signed)
Docusate

## 2020-08-18 NOTE — Assessment & Plan Note (Signed)
Will watch 

## 2020-08-18 NOTE — Telephone Encounter (Signed)
    Langley Gauss from [facility] calling to request orders for nursing , OT , PT  Langley Gauss also requesting medication list be reviewed with patient during 8/26 appointment. Patient has been taking Coreg and Spironolactone by mistake  Phone [phone]

## 2020-08-18 NOTE — Assessment & Plan Note (Signed)
07/20/20 R hip fx

## 2020-08-18 NOTE — Progress Notes (Signed)
Subjective:  Patient ID: Breanna [name], female    DOB: Sep 21, 1931  Age: 86 y.o. MRN: [id number]  CC: No chief complaint on file.   HPI [facility] presents for a recent hip fx f/u. F/u anemia, DM, HTN. BP nl at  [place] w/[facility] today    "Admit date: 07/20/2020 Discharge date: 07/29/2020  Admitted From: home Disposition:  SNF  Recommendations for Outpatient Follow-up:  1. Follow up with PCP in 1 week 2. Please obtain BMP/CBC in one week 3. Dr. Lara Mulch orthopedics in one week   Discharge Condition:Stable CODE STATUS: Full Diet recommendation: Heart Healthy  Brief/Interim Summary: Breanna [name] a 86 y.o.femalewith a past medical history of gout, essential hypertension, glaucoma, history of osteoarthritis, diabetes mellitus type 2 who was in her usual state of health when she was with a friendandwas watering her plants when the patient made a sudden turn to the right and then lost her balance and fell onto her right side and her arm.Denies hitting her head against the ground. Pain is currently 10 out of 10 in intensity.In the emergency department x-ray revealed fracture of the right femoral neck. She will be hospitalized for further management. She is noted to be bradycardic. She was noted to be on beta-blockers. Orthopedic was consulted.  1. Right hip fracture and low back pain:  -orthopedic surgery consulted -Pt now s/p surgery 7/29 -PT/OT -recommend SNF Minimize narcotics Keep dressing dry WBAT to RLE Follow-up with Ortho in 2 weeks Started on aspirin 81 twice daily for DVT prophylaxis   2. Sinus bradycardia:  -coreg discontinued -Her TSH was normal when checked earlier this month.  HR stable, asx. Continue to monitor.  3. Chronic kidney disease stage IIIb:  -Creatinine had risen to over 2 Improving, creatinine  1.09 Stable and continue to monitor.   4. Essential hypertension:  -At home she is on amlodipine, carvedilol,  Cozaar.  -held Cozaar due to elevated creatinine and coreg per above for presenting bradycardia -continue Norvasc 10mg  qd. Continue to monitor.   5. Normocytic anemia with acute blood loss anemia: Likely anemia of chronic disease.  -Hemoglobin initially noted to be near baseline prior to surgery -Recently required 2 units of PRBC's pos-operatively -Then hgb of 6.7. Had ordered 2 more units PRBC's H/h stable. Hg 9.1  Continue to monitor periodically   6. Thrombocytopenia:  -Platelet count with a low of 60 Improving, platelet 123  No signs of bleed. Continue to monitor.    7. Diabetes mellitus type 2, with renal complications with chronic kidney disease:  -On Metformin continue on discharge  HBA1C was 6.1 on July 06, 2020.  8. History of gout: Continue allopurinol as tolerated. Asx, stable.  9. Acute blood loss anemia -Suspect secondary to recent surgery. -Patient was made aware of her anemia, agreeable to blood transfusion -See above. Given 2 units earlier in this course -Given an additional 2 units for hgb of 6.7on 8/2 -No evidence of acute blood loss Hg 9.1, stable Continue to monitor  10. Fever-2/2 UTI.  ucx with pseudo. +.  Spoke to pharmacy pt can be started on ciprofloxacin x3 days. Should have repeat ucx as outpt upon completion of abx.  COvid test negative on 8/3  Discharge Diagnoses:  Active Problems:   Type 2 diabetes mellitus with renal manifestations, controlled (HCC)   Essential hypertension   Cerebral artery occlusion with cerebral infarction [facility])   Hip fracture (HCC)   Sinus bradycardia"   Outpatient Medications Prior to  Visit  Medication Sig Dispense Refill  . acetaminophen (TYLENOL) 325 MG tablet Take 2 tablets (650 mg total) by mouth every 6 (six) hours as needed for moderate pain or fever.    Marland Kitchen allopurinol (ZYLOPRIM) 300 MG tablet Take 0.5 tablets (150 mg total) by mouth daily. 90 tablet 3  . amLODipine (NORVASC) 10 MG  tablet Take 1 tablet (10 mg total) by mouth daily.    Marland Kitchen aspirin EC 81 MG EC tablet Take 1 tablet (81 mg total) by mouth 2 (two) times daily. Swallow whole. 30 tablet 11  . atropine 1 % ophthalmic solution Place 1 drop into the left eye daily as needed (Dry/irritated eyes).     . CALCIUM PO Take 1 tablet by mouth daily.     . carvedilol (COREG) 25 MG tablet Take by mouth.    . Cholecalciferol (VITAMIN D3) 2000 UNITS capsule Take 2,000 Units by mouth daily.      . cyanocobalamin 2000 MCG tablet Take 2,000 mcg by mouth daily.    . diclofenac sodium (VOLTAREN) 1 % GEL Apply 2 g topically 2 (two) times daily. 200 g 3  . docusate sodium (COLACE) 100 MG capsule Take 1 capsule (100 mg total) by mouth 2 (two) times daily. 10 capsule 0  . dorzolamide-timolol (COSOPT) 22.3-6.8 MG/ML ophthalmic solution Place 1 drop into both eyes 2 (two) times daily.     . feeding supplement, ENSURE ENLIVE, (ENSURE ENLIVE) LIQD Take 237 mLs by mouth daily at 2 PM. 237 mL 12  . feeding supplement, GLUCERNA SHAKE, (GLUCERNA SHAKE) LIQD Take 237 mLs by mouth 2 (two) times daily between meals.  0  . gabapentin (NEURONTIN) 100 MG capsule Take 1 capsule (100 mg total) by mouth 3 (three) times daily. 270 capsule 3  . latanoprost (XALATAN) 0.005 % ophthalmic solution Place 1 drop into both eyes at bedtime.     . metFORMIN (GLUCOPHAGE) 500 MG tablet Take 1 tablet (500 mg total) by mouth daily with breakfast. 90 tablet 3  . NON FORMULARY Place 10 drops under the tongue daily as needed (Pain). CBD oil     . pantoprazole (PROTONIX) 40 MG tablet Take 1 tablet (40 mg total) by mouth daily. 90 tablet 3  . polyethylene glycol (MIRALAX / GLYCOLAX) 17 g packet Take 17 g by mouth daily as needed for mild constipation. 14 each 0  . senna (SENOKOT) 8.6 MG TABS tablet Take 1 tablet (8.6 mg total) by mouth 2 (two) times daily. 120 tablet 0  . sertraline (ZOLOFT) 100 MG tablet Take 1 tablet (100 mg total) by mouth daily. 90 tablet 3  .  spironolactone (ALDACTONE) 25 MG tablet Take by mouth.     No facility-administered medications prior to visit.    ROS: Review of Systems  Constitutional: Negative for activity change, appetite change, chills, fatigue and unexpected weight change.  HENT: Negative for congestion, mouth sores and sinus pressure.   Eyes: Negative for visual disturbance.  Respiratory: Negative for cough and chest tightness.   Gastrointestinal: Negative for abdominal pain and nausea.  Genitourinary: Negative for difficulty urinating, frequency and vaginal pain.  Musculoskeletal: Positive for arthralgias. Negative for back pain and gait problem.  Skin: Negative for pallor and rash.  Neurological: Negative for dizziness, tremors, weakness, numbness and headaches.  Psychiatric/Behavioral: Negative for confusion and sleep disturbance.    Objective:  BP (!) 160/82 (BP Location: Left Arm, Patient Position: Sitting, Cuff Size: Normal)   Pulse 60   Temp 98.7 F (37.1 C) (  Oral)   Ht 5\' 4"  (1.626 m)   Wt 138 lb (62.6 kg)   SpO2 98%   BMI 23.69 kg/m   BP Readings from Last 3 Encounters:  08/18/20 (!) 160/82  07/29/20 137/71  07/06/20 (!) 160/80    Wt Readings from Last 3 Encounters:  08/18/20 138 lb (62.6 kg)  07/20/20 143 lb (64.9 kg)  07/06/20 143 lb (64.9 kg)    Physical Exam Constitutional:      General: She is not in acute distress.    Appearance: She is well-developed.  HENT:     Head: Normocephalic.     Right Ear: External ear normal.     Left Ear: External ear normal.     Nose: Nose normal.  Eyes:     General:        Right eye: No discharge.        Left eye: No discharge.     Conjunctiva/sclera: Conjunctivae normal.     Pupils: Pupils are equal, round, and reactive to light.  Neck:     Thyroid: No thyromegaly.     Vascular: No JVD.     Trachea: No tracheal deviation.  Cardiovascular:     Rate and Rhythm: Normal rate and regular rhythm.     Heart sounds: Normal heart sounds.   Pulmonary:     Effort: No respiratory distress.     Breath sounds: No stridor. No wheezing.  Abdominal:     General: Bowel sounds are normal. There is no distension.     Palpations: Abdomen is soft. There is no mass.     Tenderness: There is no abdominal tenderness. There is no guarding or rebound.  Musculoskeletal:        General: No tenderness.     Cervical back: Normal range of motion and neck supple.  Lymphadenopathy:     Cervical: No cervical adenopathy.  Skin:    Findings: No erythema or rash.  Neurological:     Mental Status: She is oriented to person, place, and time.     Cranial Nerves: No cranial nerve deficit.     Motor: No abnormal muscle tone.     Coordination: Coordination normal.     Deep Tendon Reflexes: Reflexes normal.  Psychiatric:        Behavior: Behavior normal.        Thought Content: Thought content normal.        Judgment: Judgment normal.    50 min visit/records review   Lab Results  Component Value Date   WBC 9.5 07/28/2020   HGB 9.1 (L) 07/28/2020   HCT 27.5 (L) 07/28/2020   PLT 123 (L) 07/28/2020   GLUCOSE 119 (H) 07/27/2020   CHOL 154 10/19/2014   TRIG 56.0 10/19/2014   HDL 61.10 10/19/2014   LDLCALC 82 10/19/2014   ALT 9 07/27/2020   AST 22 07/27/2020   NA 139 07/27/2020   K 4.4 07/27/2020   CL 108 07/27/2020   CREATININE 1.09 (H) 07/28/2020   BUN 22 07/27/2020   CO2 23 07/27/2020   TSH 1.26 07/06/2020   HGBA1C 6.1 (H) 07/06/2020    DG Chest 1 View  Result Date: 07/20/2020 CLINICAL DATA:  Recent fall with chest pain, initial encounter EXAM: CHEST  1 VIEW COMPARISON:  08/13/2017 FINDINGS: Cardiac shadow is enlarged accentuated by the frontal technique. The lungs are well aerated without focal infiltrate or sizable effusion. No acute bony abnormality is noted. IMPRESSION: No acute abnormality seen. Electronically Signed   By:  Inez Catalina M.D.   On: 07/20/2020 21:51   DG Lumbar Spine 2-3 Views  Result Date: 07/21/2020 CLINICAL  DATA:  Fall and back pain EXAM: LUMBAR SPINE - 2-3 VIEW COMPARISON:  June 04, 2016 FINDINGS: The patient is status post lumbar spine fixation from L3 through L5 with posterior spinal fixation hardware. No periprosthetic lucency or fracture is identified. Disc height loss with facet arthrosis is most notable at L5-S1. There is diffuse osteopenia. IMPRESSION: Status post lumbar spine fixation from L3 through L5 without complication. No definite acute fracture or malalignment. Electronically Signed   By: Prudencio Pair M.D.   On: 07/21/2020 01:15   DG Elbow Complete Right  Result Date: 07/20/2020 CLINICAL DATA:  Recent fall with elbow pain, initial encounter EXAM: RIGHT ELBOW - COMPLETE 3+ VIEW COMPARISON:  None. FINDINGS: No acute fracture or dislocation is noted. Mild olecranon spurring is seen. No soft tissue abnormality is noted. IMPRESSION: No acute abnormality noted. Electronically Signed   By: Inez Catalina M.D.   On: 07/20/2020 21:50   CT Hip Right Wo Contrast  Result Date: 07/21/2020 CLINICAL DATA:  Fall fractured hip EXAM: CT OF THE RIGHT HIP WITHOUT CONTRAST TECHNIQUE: Multidetector CT imaging of the right hip was performed according to the standard protocol. Multiplanar CT image reconstructions were also generated. COMPARISON:  None. FINDINGS: Bones/Joint/Cartilage There is a comminuted mildly displaced fracture involving the right intratrochanteric region. The fracture line extends through the greater trochanter with a mildly displaced lesser trochanter. The femoral head is slightly rotated, however still well seated within the acetabulum. There is diffuse osteopenia noted. No other definite fracture is seen. Ligaments Suboptimally assessed by CT. Muscles and Tendons The muscles surrounding the hip appear to be grossly intact. The tendons appear to be intact. Soft tissues Mild soft tissue edema seen over the lateral aspect of the hip. The visualized deep pelvis is grossly unremarkable. IMPRESSION:  Comminuted proximal right intratrochanteric femur fracture with mild displacement of the lesser trochanter. Electronically Signed   By: Prudencio Pair M.D.   On: 07/21/2020 00:25   DG C-Arm 1-60 Min  Result Date: 07/21/2020 CLINICAL DATA:  Right hip ORIF EXAM: RIGHT FEMUR 2 VIEWS; DG C-ARM 1-60 MIN COMPARISON:  X-ray 07/20/2020 FINDINGS: 3 C-arm fluoroscopic images were obtained intraoperatively and submitted for post operative interpretation. Interval placement of long IM rod with proximal lag screw traversing intertrochanteric fracture of the right femur with good anatomic alignment. 37 seconds of fluoroscopy time was utilized. Please see the performing provider's procedural report for further detail. IMPRESSION: As above. Electronically Signed   By: Davina Poke D.O.   On: 07/21/2020 13:41   DG Hip Unilat With Pelvis 2-3 Views Right  Result Date: 07/20/2020 CLINICAL DATA:  86 year old female with fall. EXAM: DG HIP (WITH OR WITHOUT PELVIS) 2-3V RIGHT COMPARISON:  None. FINDINGS: Evaluation is limited due to body habitus and osteopenia. There is a displaced fracture of the lesser trochanter of the right femur. There is a linear lucency through the base of the right femoral neck most consistent with a nondisplaced basicervical fracture. Further evaluation with CT is recommended. There is no dislocation. Partially visualized lower lumbar fusion hardware. Multiple surgical wires noted over the pelvis. The soft tissues are unremarkable. IMPRESSION: Nondisplaced basicervical fracture of the right femoral neck with displaced fracture of the lesser trochanter. Further evaluation with CT recommended. Electronically Signed   By: Anner Crete M.D.   On: 07/20/2020 21:50   DG FEMUR, MIN 2 VIEWS RIGHT  Result Date: 07/21/2020 CLINICAL DATA:  Right hip ORIF EXAM: RIGHT FEMUR 2 VIEWS; DG C-ARM 1-60 MIN COMPARISON:  X-ray 07/20/2020 FINDINGS: 3 C-arm fluoroscopic images were obtained intraoperatively and  submitted for post operative interpretation. Interval placement of long IM rod with proximal lag screw traversing intertrochanteric fracture of the right femur with good anatomic alignment. 37 seconds of fluoroscopy time was utilized. Please see the performing provider's procedural report for further detail. IMPRESSION: As above. Electronically Signed   By: Davina Poke D.O.   On: 07/21/2020 13:41    Assessment & Plan:    Walker Kehr, MD

## 2020-08-18 NOTE — Assessment & Plan Note (Addendum)
BP Readings from Last 3 Encounters:  08/18/20 (!) 160/82  07/29/20 137/71  07/06/20 (!) 160/80   BP nl at home

## 2020-08-18 NOTE — Assessment & Plan Note (Signed)
07/20/20 R hip fx Recovering

## 2020-08-18 NOTE — Assessment & Plan Note (Signed)
Metformin 

## 2020-08-19 DIAGNOSIS — M1030 Gout due to renal impairment, unspecified site: Secondary | ICD-10-CM | POA: Diagnosis not present

## 2020-08-19 DIAGNOSIS — D631 Anemia in chronic kidney disease: Secondary | ICD-10-CM | POA: Diagnosis not present

## 2020-08-19 DIAGNOSIS — N1832 Chronic kidney disease, stage 3b: Secondary | ICD-10-CM | POA: Diagnosis not present

## 2020-08-19 DIAGNOSIS — S72141D Displaced intertrochanteric fracture of right femur, subsequent encounter for closed fracture with routine healing: Secondary | ICD-10-CM | POA: Diagnosis not present

## 2020-08-19 DIAGNOSIS — L89152 Pressure ulcer of sacral region, stage 2: Secondary | ICD-10-CM | POA: Diagnosis not present

## 2020-08-19 DIAGNOSIS — I272 Pulmonary hypertension, unspecified: Secondary | ICD-10-CM | POA: Diagnosis not present

## 2020-08-19 DIAGNOSIS — E1122 Type 2 diabetes mellitus with diabetic chronic kidney disease: Secondary | ICD-10-CM | POA: Diagnosis not present

## 2020-08-19 DIAGNOSIS — S72121D Displaced fracture of lesser trochanter of right femur, subsequent encounter for closed fracture with routine healing: Secondary | ICD-10-CM | POA: Diagnosis not present

## 2020-08-19 DIAGNOSIS — I129 Hypertensive chronic kidney disease with stage 1 through stage 4 chronic kidney disease, or unspecified chronic kidney disease: Secondary | ICD-10-CM | POA: Diagnosis not present

## 2020-08-19 LAB — COMPLETE METABOLIC PANEL WITH GFR
AG Ratio: 1.3 (calc) (ref 1.0–2.5)
ALT: 6 U/L (ref 6–29)
AST: 15 U/L (ref 10–35)
Albumin: 3.7 g/dL (ref 3.6–5.1)
Alkaline phosphatase (APISO): 251 U/L — ABNORMAL HIGH (ref 37–153)
BUN/Creatinine Ratio: 19 (calc) (ref 6–22)
BUN: 26 mg/dL — ABNORMAL HIGH (ref 7–25)
CO2: 28 mmol/L (ref 20–32)
Calcium: 9.7 mg/dL (ref 8.6–10.4)
Chloride: 109 mmol/L (ref 98–110)
Creat: 1.35 mg/dL — ABNORMAL HIGH (ref 0.60–0.88)
GFR, Est African American: 41 mL/min/{1.73_m2} — ABNORMAL LOW (ref >=60)
GFR, Est Non African American: 35 mL/min/{1.73_m2} — ABNORMAL LOW (ref >=60)
Globulin: 2.9 g/dL (calc) (ref 1.9–3.7)
Glucose, Bld: 93 mg/dL (ref 65–99)
Potassium: 4.8 mmol/L (ref 3.5–5.3)
Sodium: 143 mmol/L (ref 135–146)
Total Bilirubin: 0.5 mg/dL (ref 0.2–1.2)
Total Protein: 6.6 g/dL (ref 6.1–8.1)

## 2020-08-19 LAB — IRON: Iron: 83 ug/dL (ref 45–160)

## 2020-08-22 DIAGNOSIS — D631 Anemia in chronic kidney disease: Secondary | ICD-10-CM | POA: Diagnosis not present

## 2020-08-22 DIAGNOSIS — S72121D Displaced fracture of lesser trochanter of right femur, subsequent encounter for closed fracture with routine healing: Secondary | ICD-10-CM | POA: Diagnosis not present

## 2020-08-22 DIAGNOSIS — N1832 Chronic kidney disease, stage 3b: Secondary | ICD-10-CM | POA: Diagnosis not present

## 2020-08-22 DIAGNOSIS — I129 Hypertensive chronic kidney disease with stage 1 through stage 4 chronic kidney disease, or unspecified chronic kidney disease: Secondary | ICD-10-CM | POA: Diagnosis not present

## 2020-08-22 DIAGNOSIS — L89152 Pressure ulcer of sacral region, stage 2: Secondary | ICD-10-CM | POA: Diagnosis not present

## 2020-08-22 DIAGNOSIS — E1122 Type 2 diabetes mellitus with diabetic chronic kidney disease: Secondary | ICD-10-CM | POA: Diagnosis not present

## 2020-08-22 DIAGNOSIS — M1030 Gout due to renal impairment, unspecified site: Secondary | ICD-10-CM | POA: Diagnosis not present

## 2020-08-22 DIAGNOSIS — S72141D Displaced intertrochanteric fracture of right femur, subsequent encounter for closed fracture with routine healing: Secondary | ICD-10-CM | POA: Diagnosis not present

## 2020-08-22 DIAGNOSIS — I272 Pulmonary hypertension, unspecified: Secondary | ICD-10-CM | POA: Diagnosis not present

## 2020-08-23 ENCOUNTER — Encounter: Payer: Self-pay | Admitting: Internal Medicine

## 2020-08-23 NOTE — Telephone Encounter (Signed)
Okay nursing, OT, PT. Okay not to take Coreg and spironolactone.  Thanks

## 2020-08-24 DIAGNOSIS — S72141D Displaced intertrochanteric fracture of right femur, subsequent encounter for closed fracture with routine healing: Secondary | ICD-10-CM | POA: Diagnosis not present

## 2020-08-24 DIAGNOSIS — I129 Hypertensive chronic kidney disease with stage 1 through stage 4 chronic kidney disease, or unspecified chronic kidney disease: Secondary | ICD-10-CM | POA: Diagnosis not present

## 2020-08-24 DIAGNOSIS — L89152 Pressure ulcer of sacral region, stage 2: Secondary | ICD-10-CM | POA: Diagnosis not present

## 2020-08-24 DIAGNOSIS — S72121D Displaced fracture of lesser trochanter of right femur, subsequent encounter for closed fracture with routine healing: Secondary | ICD-10-CM | POA: Diagnosis not present

## 2020-08-24 DIAGNOSIS — I272 Pulmonary hypertension, unspecified: Secondary | ICD-10-CM | POA: Diagnosis not present

## 2020-08-24 DIAGNOSIS — E1122 Type 2 diabetes mellitus with diabetic chronic kidney disease: Secondary | ICD-10-CM | POA: Diagnosis not present

## 2020-08-24 DIAGNOSIS — N1832 Chronic kidney disease, stage 3b: Secondary | ICD-10-CM | POA: Diagnosis not present

## 2020-08-24 DIAGNOSIS — M1030 Gout due to renal impairment, unspecified site: Secondary | ICD-10-CM | POA: Diagnosis not present

## 2020-08-24 DIAGNOSIS — D631 Anemia in chronic kidney disease: Secondary | ICD-10-CM | POA: Diagnosis not present

## 2020-08-24 MED ORDER — CARVEDILOL 25 MG PO TABS: 25.0000 mg | ORAL_TABLET | Freq: Two times a day (BID) | ORAL | 3 refills | Status: DC

## 2020-08-24 NOTE — Telephone Encounter (Signed)
Spoke with Breanna [name] that states pt's bp is in the 50's when she takes those medications and just wants you to be aware and confirm that you do still want her taking Coreg.

## 2020-08-24 NOTE — Telephone Encounter (Signed)
Sorry. Take Coreg. Stop spironolactone Thx

## 2020-08-24 NOTE — Telephone Encounter (Signed)
No.Thx.

## 2020-08-25 NOTE — Telephone Encounter (Signed)
Denise informed of below

## 2020-08-25 NOTE — Telephone Encounter (Signed)
Stop Coreg Thx

## 2020-08-26 DIAGNOSIS — D631 Anemia in chronic kidney disease: Secondary | ICD-10-CM | POA: Diagnosis not present

## 2020-08-26 DIAGNOSIS — S72121D Displaced fracture of lesser trochanter of right femur, subsequent encounter for closed fracture with routine healing: Secondary | ICD-10-CM | POA: Diagnosis not present

## 2020-08-26 DIAGNOSIS — L89152 Pressure ulcer of sacral region, stage 2: Secondary | ICD-10-CM | POA: Diagnosis not present

## 2020-08-26 DIAGNOSIS — E1122 Type 2 diabetes mellitus with diabetic chronic kidney disease: Secondary | ICD-10-CM | POA: Diagnosis not present

## 2020-08-26 DIAGNOSIS — I272 Pulmonary hypertension, unspecified: Secondary | ICD-10-CM | POA: Diagnosis not present

## 2020-08-26 DIAGNOSIS — I129 Hypertensive chronic kidney disease with stage 1 through stage 4 chronic kidney disease, or unspecified chronic kidney disease: Secondary | ICD-10-CM | POA: Diagnosis not present

## 2020-08-26 DIAGNOSIS — S72141D Displaced intertrochanteric fracture of right femur, subsequent encounter for closed fracture with routine healing: Secondary | ICD-10-CM | POA: Diagnosis not present

## 2020-08-26 DIAGNOSIS — N1832 Chronic kidney disease, stage 3b: Secondary | ICD-10-CM | POA: Diagnosis not present

## 2020-08-26 DIAGNOSIS — M1030 Gout due to renal impairment, unspecified site: Secondary | ICD-10-CM | POA: Diagnosis not present

## 2020-08-30 DIAGNOSIS — I129 Hypertensive chronic kidney disease with stage 1 through stage 4 chronic kidney disease, or unspecified chronic kidney disease: Secondary | ICD-10-CM | POA: Diagnosis not present

## 2020-08-30 DIAGNOSIS — N1832 Chronic kidney disease, stage 3b: Secondary | ICD-10-CM | POA: Diagnosis not present

## 2020-08-30 DIAGNOSIS — S72141D Displaced intertrochanteric fracture of right femur, subsequent encounter for closed fracture with routine healing: Secondary | ICD-10-CM | POA: Diagnosis not present

## 2020-08-30 DIAGNOSIS — E1122 Type 2 diabetes mellitus with diabetic chronic kidney disease: Secondary | ICD-10-CM | POA: Diagnosis not present

## 2020-08-30 DIAGNOSIS — I272 Pulmonary hypertension, unspecified: Secondary | ICD-10-CM | POA: Diagnosis not present

## 2020-08-30 DIAGNOSIS — M1030 Gout due to renal impairment, unspecified site: Secondary | ICD-10-CM | POA: Diagnosis not present

## 2020-08-30 DIAGNOSIS — L89152 Pressure ulcer of sacral region, stage 2: Secondary | ICD-10-CM | POA: Diagnosis not present

## 2020-08-30 DIAGNOSIS — S72121D Displaced fracture of lesser trochanter of right femur, subsequent encounter for closed fracture with routine healing: Secondary | ICD-10-CM | POA: Diagnosis not present

## 2020-08-30 DIAGNOSIS — D631 Anemia in chronic kidney disease: Secondary | ICD-10-CM | POA: Diagnosis not present

## 2020-08-31 ENCOUNTER — Telehealth: Payer: Self-pay | Admitting: Internal Medicine

## 2020-08-31 DIAGNOSIS — N1832 Chronic kidney disease, stage 3b: Secondary | ICD-10-CM | POA: Diagnosis not present

## 2020-08-31 DIAGNOSIS — L89152 Pressure ulcer of sacral region, stage 2: Secondary | ICD-10-CM | POA: Diagnosis not present

## 2020-08-31 DIAGNOSIS — S72141D Displaced intertrochanteric fracture of right femur, subsequent encounter for closed fracture with routine healing: Secondary | ICD-10-CM | POA: Diagnosis not present

## 2020-08-31 DIAGNOSIS — I129 Hypertensive chronic kidney disease with stage 1 through stage 4 chronic kidney disease, or unspecified chronic kidney disease: Secondary | ICD-10-CM | POA: Diagnosis not present

## 2020-08-31 DIAGNOSIS — S72121D Displaced fracture of lesser trochanter of right femur, subsequent encounter for closed fracture with routine healing: Secondary | ICD-10-CM | POA: Diagnosis not present

## 2020-08-31 DIAGNOSIS — I272 Pulmonary hypertension, unspecified: Secondary | ICD-10-CM | POA: Diagnosis not present

## 2020-08-31 DIAGNOSIS — D631 Anemia in chronic kidney disease: Secondary | ICD-10-CM | POA: Diagnosis not present

## 2020-08-31 DIAGNOSIS — E1122 Type 2 diabetes mellitus with diabetic chronic kidney disease: Secondary | ICD-10-CM | POA: Diagnosis not present

## 2020-08-31 DIAGNOSIS — M1030 Gout due to renal impairment, unspecified site: Secondary | ICD-10-CM | POA: Diagnosis not present

## 2020-08-31 NOTE — Telephone Encounter (Signed)
   Denise from [facility] requesting verbal order to extend nursing for 2 visits to continue care for  pressure wound.  Phone [phone], ok to leave message

## 2020-09-01 DIAGNOSIS — S72121D Displaced fracture of lesser trochanter of right femur, subsequent encounter for closed fracture with routine healing: Secondary | ICD-10-CM | POA: Diagnosis not present

## 2020-09-01 DIAGNOSIS — N1832 Chronic kidney disease, stage 3b: Secondary | ICD-10-CM | POA: Diagnosis not present

## 2020-09-01 DIAGNOSIS — M1030 Gout due to renal impairment, unspecified site: Secondary | ICD-10-CM | POA: Diagnosis not present

## 2020-09-01 DIAGNOSIS — S72141D Displaced intertrochanteric fracture of right femur, subsequent encounter for closed fracture with routine healing: Secondary | ICD-10-CM | POA: Diagnosis not present

## 2020-09-01 DIAGNOSIS — E1122 Type 2 diabetes mellitus with diabetic chronic kidney disease: Secondary | ICD-10-CM | POA: Diagnosis not present

## 2020-09-01 DIAGNOSIS — I129 Hypertensive chronic kidney disease with stage 1 through stage 4 chronic kidney disease, or unspecified chronic kidney disease: Secondary | ICD-10-CM | POA: Diagnosis not present

## 2020-09-01 DIAGNOSIS — I272 Pulmonary hypertension, unspecified: Secondary | ICD-10-CM | POA: Diagnosis not present

## 2020-09-01 DIAGNOSIS — L89152 Pressure ulcer of sacral region, stage 2: Secondary | ICD-10-CM | POA: Diagnosis not present

## 2020-09-01 DIAGNOSIS — D631 Anemia in chronic kidney disease: Secondary | ICD-10-CM | POA: Diagnosis not present

## 2020-09-02 NOTE — Telephone Encounter (Signed)
Ok Thx 

## 2020-09-02 NOTE — Telephone Encounter (Signed)
Denise informed of verbal orders

## 2020-09-05 DIAGNOSIS — S72121D Displaced fracture of lesser trochanter of right femur, subsequent encounter for closed fracture with routine healing: Secondary | ICD-10-CM | POA: Diagnosis not present

## 2020-09-05 DIAGNOSIS — I129 Hypertensive chronic kidney disease with stage 1 through stage 4 chronic kidney disease, or unspecified chronic kidney disease: Secondary | ICD-10-CM | POA: Diagnosis not present

## 2020-09-05 DIAGNOSIS — M1030 Gout due to renal impairment, unspecified site: Secondary | ICD-10-CM | POA: Diagnosis not present

## 2020-09-05 DIAGNOSIS — S72141D Displaced intertrochanteric fracture of right femur, subsequent encounter for closed fracture with routine healing: Secondary | ICD-10-CM | POA: Diagnosis not present

## 2020-09-05 DIAGNOSIS — N1832 Chronic kidney disease, stage 3b: Secondary | ICD-10-CM | POA: Diagnosis not present

## 2020-09-05 DIAGNOSIS — D631 Anemia in chronic kidney disease: Secondary | ICD-10-CM | POA: Diagnosis not present

## 2020-09-05 DIAGNOSIS — E1122 Type 2 diabetes mellitus with diabetic chronic kidney disease: Secondary | ICD-10-CM | POA: Diagnosis not present

## 2020-09-05 DIAGNOSIS — L89152 Pressure ulcer of sacral region, stage 2: Secondary | ICD-10-CM | POA: Diagnosis not present

## 2020-09-05 DIAGNOSIS — I272 Pulmonary hypertension, unspecified: Secondary | ICD-10-CM | POA: Diagnosis not present

## 2020-09-06 DIAGNOSIS — S72121D Displaced fracture of lesser trochanter of right femur, subsequent encounter for closed fracture with routine healing: Secondary | ICD-10-CM | POA: Diagnosis not present

## 2020-09-06 DIAGNOSIS — S72141D Displaced intertrochanteric fracture of right femur, subsequent encounter for closed fracture with routine healing: Secondary | ICD-10-CM | POA: Diagnosis not present

## 2020-09-06 DIAGNOSIS — I272 Pulmonary hypertension, unspecified: Secondary | ICD-10-CM | POA: Diagnosis not present

## 2020-09-06 DIAGNOSIS — L89152 Pressure ulcer of sacral region, stage 2: Secondary | ICD-10-CM | POA: Diagnosis not present

## 2020-09-06 DIAGNOSIS — N1832 Chronic kidney disease, stage 3b: Secondary | ICD-10-CM | POA: Diagnosis not present

## 2020-09-06 DIAGNOSIS — M1030 Gout due to renal impairment, unspecified site: Secondary | ICD-10-CM | POA: Diagnosis not present

## 2020-09-06 DIAGNOSIS — I129 Hypertensive chronic kidney disease with stage 1 through stage 4 chronic kidney disease, or unspecified chronic kidney disease: Secondary | ICD-10-CM | POA: Diagnosis not present

## 2020-09-06 DIAGNOSIS — E1122 Type 2 diabetes mellitus with diabetic chronic kidney disease: Secondary | ICD-10-CM | POA: Diagnosis not present

## 2020-09-06 DIAGNOSIS — D631 Anemia in chronic kidney disease: Secondary | ICD-10-CM | POA: Diagnosis not present

## 2020-09-07 DIAGNOSIS — S72141D Displaced intertrochanteric fracture of right femur, subsequent encounter for closed fracture with routine healing: Secondary | ICD-10-CM | POA: Diagnosis not present

## 2020-09-07 DIAGNOSIS — I129 Hypertensive chronic kidney disease with stage 1 through stage 4 chronic kidney disease, or unspecified chronic kidney disease: Secondary | ICD-10-CM | POA: Diagnosis not present

## 2020-09-07 DIAGNOSIS — E1122 Type 2 diabetes mellitus with diabetic chronic kidney disease: Secondary | ICD-10-CM | POA: Diagnosis not present

## 2020-09-07 DIAGNOSIS — M1030 Gout due to renal impairment, unspecified site: Secondary | ICD-10-CM | POA: Diagnosis not present

## 2020-09-07 DIAGNOSIS — N1832 Chronic kidney disease, stage 3b: Secondary | ICD-10-CM | POA: Diagnosis not present

## 2020-09-07 DIAGNOSIS — S72121D Displaced fracture of lesser trochanter of right femur, subsequent encounter for closed fracture with routine healing: Secondary | ICD-10-CM | POA: Diagnosis not present

## 2020-09-07 DIAGNOSIS — L89152 Pressure ulcer of sacral region, stage 2: Secondary | ICD-10-CM | POA: Diagnosis not present

## 2020-09-07 DIAGNOSIS — D631 Anemia in chronic kidney disease: Secondary | ICD-10-CM | POA: Diagnosis not present

## 2020-09-07 DIAGNOSIS — I272 Pulmonary hypertension, unspecified: Secondary | ICD-10-CM | POA: Diagnosis not present

## 2020-09-08 ENCOUNTER — Ambulatory Visit: Payer: Medicare PPO | Admitting: Sports Medicine

## 2020-09-08 ENCOUNTER — Other Ambulatory Visit: Payer: Self-pay

## 2020-09-08 ENCOUNTER — Encounter: Payer: Self-pay | Admitting: Sports Medicine

## 2020-09-08 DIAGNOSIS — M79675 Pain in left toe(s): Secondary | ICD-10-CM

## 2020-09-08 DIAGNOSIS — M2042 Other hammer toe(s) (acquired), left foot: Secondary | ICD-10-CM

## 2020-09-08 DIAGNOSIS — M2041 Other hammer toe(s) (acquired), right foot: Secondary | ICD-10-CM

## 2020-09-08 DIAGNOSIS — B351 Tinea unguium: Secondary | ICD-10-CM | POA: Diagnosis not present

## 2020-09-08 DIAGNOSIS — M79674 Pain in right toe(s): Secondary | ICD-10-CM

## 2020-09-08 DIAGNOSIS — L84 Corns and callosities: Secondary | ICD-10-CM

## 2020-09-08 DIAGNOSIS — E119 Type 2 diabetes mellitus without complications: Secondary | ICD-10-CM | POA: Diagnosis not present

## 2020-09-08 DIAGNOSIS — M2142 Flat foot [pes planus] (acquired), left foot: Secondary | ICD-10-CM

## 2020-09-08 DIAGNOSIS — M2141 Flat foot [pes planus] (acquired), right foot: Secondary | ICD-10-CM

## 2020-09-08 NOTE — Progress Notes (Signed)
Subjective: Breanna [name] is a 86 y.o. female patient with history of diabetes who returns to office today complaining of long,mildly painful nails and callus to toes while ambulating in shoes; unable to trim.  Patient reports that she has broken her hip and is now on a walker. Reports that she wants to return her Diabetic shoes. No other pedal complaints.  Patient Active Problem List   Diagnosis Date Noted  . Anemia 08/18/2020  . Constipation 08/18/2020  . Hip fracture requiring operative repair ([place]) 07/21/2020  . Sinus bradycardia 07/21/2020  . Hip pain 07/06/2020  . Fall against object 12/25/2018  . Head contusion 12/25/2018  . Acute pain of right knee 09/28/2018  . Well adult exam 05/13/2018  . Herpes labialis 05/13/2018  . Night sweats 04/04/2018  . Breast symptom 10/30/2017  . Diarrhea 08/21/2017  . UTI (urinary tract infection) 08/21/2017  . Lung nodule 08/21/2017  . FTT (failure to thrive) in adult 08/21/2017  . Shortness of breath 08/13/2017  . Cough 08/13/2017  . Fatigue 08/13/2017  . Fever 08/13/2017  . Blurry vision 08/13/2017  . Shoulder pain, right 07/25/2017  . Callus of foot 06/07/2017  . Need for prophylactic vaccination and inoculation against influenza 11/21/2016  . Trigger finger, acquired 11/12/2016  . Meteorism 07/11/2016  . Dark stools 01/10/2016  . Osteoporosis 07/13/2015  . Fall at home 01/13/2015  . Cornea disorder 11/22/2014  . Primary open angle glaucoma of both eyes, indeterminate stage 11/22/2014  . Secondary corneal edema 10/14/2014  . Swelling of left knee joint 04/07/2014  . Grief 01/04/2014  . Syncope 01/04/2014  . Thoracic spine pain 10/22/2013  . Hypernatremia 07/07/2013  . Viral intestinal infection 03/25/2013  . Dehydration, moderate 03/24/2013  . Food poisoning 03/24/2013  . Headache(784.0) 12/22/2012  . URI (upper respiratory infection) 10/21/2012  . Diabetic neuropathy ([place]) 07/30/2012  . Neck pain on right side 07/30/2012   . Cerumen impaction 07/17/2012  . Cystoid macular edema 06/19/2012  . History of corneal transplant 06/19/2012  . Weight loss 04/10/2012  . Wrist pain, right 04/06/2011  . PARESTHESIA 01/03/2011  . FULL INCONTINENCE OF FECES 01/01/2011  . ABDOMINAL PAIN RIGHT UPPER QUADRANT 01/01/2011  . LEG PAIN, LEFT 10/02/2010  . Cerebral artery occlusion with cerebral infarction ([place]) 03/27/2010  . GERD 03/27/2010  . DIVERTICULOSIS-COLON 03/27/2010  . CHOLELITHIASIS 03/27/2010  . LOW BACK PAIN 09/07/2009  . ANEMIA, IRON DEFICIENCY, HX OF 06/14/2009  . DYSPHAGIA 05/16/2009  . Dysphagia 05/16/2009  . Dyslipidemia 11/06/2007  . Hyperlipidemia 11/06/2007  . Type 2 diabetes mellitus with renal manifestations, controlled ([place]) 07/29/2007  . Gout 07/29/2007  . Essential hypertension 07/29/2007  . HYDRONEPHROSIS 07/29/2007  . Osteoarthritis 07/29/2007  . COLONIC POLYPS, HX OF 07/29/2007  . DIVERTICULITIS, HX OF 07/29/2007   Current Outpatient Medications on File Prior to Visit  Medication Sig Dispense Refill  . acetaminophen (TYLENOL) 325 MG tablet Take 2 tablets (650 mg total) by mouth every 6 (six) hours as needed for moderate pain or fever.    Marland Kitchen allopurinol (ZYLOPRIM) 300 MG tablet Take 0.5 tablets (150 mg total) by mouth daily. 90 tablet 3  . amLODipine (NORVASC) 10 MG tablet Take 1 tablet (10 mg total) by mouth daily.    Marland Kitchen aspirin EC 81 MG EC tablet Take 1 tablet (81 mg total) by mouth 2 (two) times daily. Swallow whole. 30 tablet 11  . atropine 1 % ophthalmic solution Place 1 drop into the left eye daily as needed (Dry/irritated eyes).     Marland Kitchen  CALCIUM PO Take 1 tablet by mouth daily.     . carvedilol (COREG) 25 MG tablet Take 1 tablet (25 mg total) by mouth 2 (two) times daily with a meal. 180 tablet 3  . Cholecalciferol (VITAMIN D3) 2000 UNITS capsule Take 2,000 Units by mouth daily.      . cyanocobalamin 2000 MCG tablet Take 2,000 mcg by mouth daily.    . diclofenac sodium (VOLTAREN) 1 % GEL  Apply 2 g topically 2 (two) times daily. 200 g 3  . docusate sodium (COLACE) 100 MG capsule Take 1 capsule (100 mg total) by mouth 2 (two) times daily. 60 capsule 11  . dorzolamide-timolol (COSOPT) 22.3-6.8 MG/ML ophthalmic solution Place 1 drop into both eyes 2 (two) times daily.     . feeding supplement, ENSURE ENLIVE, (ENSURE ENLIVE) LIQD Take 237 mLs by mouth daily at 2 PM. 237 mL 12  . feeding supplement, GLUCERNA SHAKE, (GLUCERNA SHAKE) LIQD Take 237 mLs by mouth 2 (two) times daily between meals.  0  . gabapentin (NEURONTIN) 100 MG capsule Take 1 capsule (100 mg total) by mouth 3 (three) times daily. 270 capsule 3  . latanoprost (XALATAN) 0.005 % ophthalmic solution Place 1 drop into both eyes at bedtime.     Marland Kitchen losartan (COZAAR) 25 MG tablet     . metFORMIN (GLUCOPHAGE) 500 MG tablet Take 1 tablet (500 mg total) by mouth daily with breakfast. 90 tablet 3  . NON FORMULARY Place 10 drops under the tongue daily as needed (Pain). CBD oil     . pantoprazole (PROTONIX) 40 MG tablet Take 1 tablet (40 mg total) by mouth daily. 90 tablet 3  . polyethylene glycol (MIRALAX / GLYCOLAX) 17 g packet Take 17 g by mouth daily as needed for mild constipation. 14 each 0  . senna (SENOKOT) 8.6 MG TABS tablet Take 1 tablet (8.6 mg total) by mouth 2 (two) times daily. 120 tablet 0  . sertraline (ZOLOFT) 100 MG tablet Take 1 tablet (100 mg total) by mouth daily. 90 tablet 3  . spironolactone (ALDACTONE) 25 MG tablet Take by mouth.     No current facility-administered medications on file prior to visit.   Allergies  Allergen Reactions  . Verapamil Shortness Of Breath    REACTION: SOB  . Aspirin Itching    But tolerates low dose  . Atenolol     REACTION: fatigue  . Codeine Itching  . Codeine Sulfate   . Hydrochlorothiazide     REACTION: gout  . Hydrocodone       side effects - hallucinations  . Ibuprofen     Upset stomach w/high doses  . Lisinopril     REACTION: cough  . Onion Other (See  Comments)    Dry mouth/ gets sores  . Shellfish Allergy Swelling    Patient stated she does not eat shellfish, she swells up on different parts of the body  . Valsartan Itching  . Adhesive  [Tape] Rash      Objective: General: Patient is awake, alert, and oriented x 3 and in no acute distress.  Integument: Skin is warm, dry and supple bilateral. Nails are tender, long, thickened and dystrophic with subungual debris, consistent with onychomycosis, 1-5 on left and 1,3-5 on right. No signs of infection. Minimal callus at distal tuft at 3rd toes bilateral. Remaining integument unremarkable.  Neurovascular status unchanged since last visit.  Musculoskeletal: Asymptomatic hammertoe and pes planus pedal deformities noted bilateral. Muscular strength 5/5 in all lower extremity  muscular groups bilateral without pain on range of motion. No tenderness with calf compression bilateral.  Assessment and Plan: Problem List Items Addressed This Visit      Musculoskeletal and Integument   Callus of foot    Other Visit Diagnoses    Pain due to onychomycosis of toenails of both feet    -  Primary   Diabetes mellitus without complication (HCC)       Relevant Medications   losartan (COZAAR) 25 MG tablet   Hammer toes of both feet       Pes planus of both feet          -Examined patient. -Re-discussed and educated patient on diabetic foot care, especially with  regards to the vascular, neurological and musculoskeletal systems.  -Mechanically debrided all nails 1-5 bilateral using sterile nail nipper and filed with dremel without incident  -Mechanically debrided callus at tips of third toes bilateral using a sterile 15 blade without incident at no additional charge like previous -Patient met with Dawn to discuss her Diabetic shoes/return -Patient to return  in 3 months for at risk foot care  -Patient advised to call the office if any problems or questions arise in the meantime.  Landis Martins,  DPM

## 2020-09-09 ENCOUNTER — Telehealth: Payer: Self-pay | Admitting: Internal Medicine

## 2020-09-09 NOTE — Telephone Encounter (Signed)
Patient is having a dental cleaning on 09/12/20. She said her dentist informed her she needs approval from her PCP that she could have her cleaning and may need abx called in for this.   Patient requesting a call back.

## 2020-09-12 DIAGNOSIS — I272 Pulmonary hypertension, unspecified: Secondary | ICD-10-CM | POA: Diagnosis not present

## 2020-09-12 DIAGNOSIS — L89152 Pressure ulcer of sacral region, stage 2: Secondary | ICD-10-CM | POA: Diagnosis not present

## 2020-09-12 DIAGNOSIS — M1030 Gout due to renal impairment, unspecified site: Secondary | ICD-10-CM | POA: Diagnosis not present

## 2020-09-12 DIAGNOSIS — E1122 Type 2 diabetes mellitus with diabetic chronic kidney disease: Secondary | ICD-10-CM | POA: Diagnosis not present

## 2020-09-12 DIAGNOSIS — S72121D Displaced fracture of lesser trochanter of right femur, subsequent encounter for closed fracture with routine healing: Secondary | ICD-10-CM | POA: Diagnosis not present

## 2020-09-12 DIAGNOSIS — S72141D Displaced intertrochanteric fracture of right femur, subsequent encounter for closed fracture with routine healing: Secondary | ICD-10-CM | POA: Diagnosis not present

## 2020-09-12 DIAGNOSIS — N1832 Chronic kidney disease, stage 3b: Secondary | ICD-10-CM | POA: Diagnosis not present

## 2020-09-12 DIAGNOSIS — D631 Anemia in chronic kidney disease: Secondary | ICD-10-CM | POA: Diagnosis not present

## 2020-09-12 DIAGNOSIS — I129 Hypertensive chronic kidney disease with stage 1 through stage 4 chronic kidney disease, or unspecified chronic kidney disease: Secondary | ICD-10-CM | POA: Diagnosis not present

## 2020-09-12 NOTE — Telephone Encounter (Signed)
   Follow up message    Patient wants to know if antibiotic needed prior to dental appointment

## 2020-09-13 DIAGNOSIS — S72121D Displaced fracture of lesser trochanter of right femur, subsequent encounter for closed fracture with routine healing: Secondary | ICD-10-CM | POA: Diagnosis not present

## 2020-09-13 DIAGNOSIS — I272 Pulmonary hypertension, unspecified: Secondary | ICD-10-CM | POA: Diagnosis not present

## 2020-09-13 DIAGNOSIS — M1030 Gout due to renal impairment, unspecified site: Secondary | ICD-10-CM | POA: Diagnosis not present

## 2020-09-13 DIAGNOSIS — I129 Hypertensive chronic kidney disease with stage 1 through stage 4 chronic kidney disease, or unspecified chronic kidney disease: Secondary | ICD-10-CM | POA: Diagnosis not present

## 2020-09-13 DIAGNOSIS — E1122 Type 2 diabetes mellitus with diabetic chronic kidney disease: Secondary | ICD-10-CM | POA: Diagnosis not present

## 2020-09-13 DIAGNOSIS — N1832 Chronic kidney disease, stage 3b: Secondary | ICD-10-CM | POA: Diagnosis not present

## 2020-09-13 DIAGNOSIS — D631 Anemia in chronic kidney disease: Secondary | ICD-10-CM | POA: Diagnosis not present

## 2020-09-13 DIAGNOSIS — L89152 Pressure ulcer of sacral region, stage 2: Secondary | ICD-10-CM | POA: Diagnosis not present

## 2020-09-13 DIAGNOSIS — S72141D Displaced intertrochanteric fracture of right femur, subsequent encounter for closed fracture with routine healing: Secondary | ICD-10-CM | POA: Diagnosis not present

## 2020-09-14 NOTE — Telephone Encounter (Signed)
Okay to have dental work done.  No need for a prophylactic antibiotic.  Thanks

## 2020-09-15 DIAGNOSIS — E1122 Type 2 diabetes mellitus with diabetic chronic kidney disease: Secondary | ICD-10-CM | POA: Diagnosis not present

## 2020-09-15 DIAGNOSIS — D631 Anemia in chronic kidney disease: Secondary | ICD-10-CM | POA: Diagnosis not present

## 2020-09-15 DIAGNOSIS — S72141D Displaced intertrochanteric fracture of right femur, subsequent encounter for closed fracture with routine healing: Secondary | ICD-10-CM | POA: Diagnosis not present

## 2020-09-15 DIAGNOSIS — I272 Pulmonary hypertension, unspecified: Secondary | ICD-10-CM | POA: Diagnosis not present

## 2020-09-15 DIAGNOSIS — N1832 Chronic kidney disease, stage 3b: Secondary | ICD-10-CM | POA: Diagnosis not present

## 2020-09-15 DIAGNOSIS — L89152 Pressure ulcer of sacral region, stage 2: Secondary | ICD-10-CM | POA: Diagnosis not present

## 2020-09-15 DIAGNOSIS — S72121D Displaced fracture of lesser trochanter of right femur, subsequent encounter for closed fracture with routine healing: Secondary | ICD-10-CM | POA: Diagnosis not present

## 2020-09-15 DIAGNOSIS — I129 Hypertensive chronic kidney disease with stage 1 through stage 4 chronic kidney disease, or unspecified chronic kidney disease: Secondary | ICD-10-CM | POA: Diagnosis not present

## 2020-09-15 DIAGNOSIS — M1030 Gout due to renal impairment, unspecified site: Secondary | ICD-10-CM | POA: Diagnosis not present

## 2020-09-15 NOTE — Telephone Encounter (Signed)
Spoke with patient and info given 

## 2020-09-19 DIAGNOSIS — M1030 Gout due to renal impairment, unspecified site: Secondary | ICD-10-CM | POA: Diagnosis not present

## 2020-09-19 DIAGNOSIS — I272 Pulmonary hypertension, unspecified: Secondary | ICD-10-CM | POA: Diagnosis not present

## 2020-09-19 DIAGNOSIS — N1832 Chronic kidney disease, stage 3b: Secondary | ICD-10-CM | POA: Diagnosis not present

## 2020-09-19 DIAGNOSIS — S72121D Displaced fracture of lesser trochanter of right femur, subsequent encounter for closed fracture with routine healing: Secondary | ICD-10-CM | POA: Diagnosis not present

## 2020-09-19 DIAGNOSIS — I129 Hypertensive chronic kidney disease with stage 1 through stage 4 chronic kidney disease, or unspecified chronic kidney disease: Secondary | ICD-10-CM | POA: Diagnosis not present

## 2020-09-19 DIAGNOSIS — E1122 Type 2 diabetes mellitus with diabetic chronic kidney disease: Secondary | ICD-10-CM | POA: Diagnosis not present

## 2020-09-19 DIAGNOSIS — L89152 Pressure ulcer of sacral region, stage 2: Secondary | ICD-10-CM | POA: Diagnosis not present

## 2020-09-19 DIAGNOSIS — D631 Anemia in chronic kidney disease: Secondary | ICD-10-CM | POA: Diagnosis not present

## 2020-09-19 DIAGNOSIS — S72141D Displaced intertrochanteric fracture of right femur, subsequent encounter for closed fracture with routine healing: Secondary | ICD-10-CM | POA: Diagnosis not present

## 2020-09-30 DIAGNOSIS — E1122 Type 2 diabetes mellitus with diabetic chronic kidney disease: Secondary | ICD-10-CM | POA: Diagnosis not present

## 2020-09-30 DIAGNOSIS — D631 Anemia in chronic kidney disease: Secondary | ICD-10-CM | POA: Diagnosis not present

## 2020-09-30 DIAGNOSIS — S72121D Displaced fracture of lesser trochanter of right femur, subsequent encounter for closed fracture with routine healing: Secondary | ICD-10-CM | POA: Diagnosis not present

## 2020-09-30 DIAGNOSIS — S72141D Displaced intertrochanteric fracture of right femur, subsequent encounter for closed fracture with routine healing: Secondary | ICD-10-CM | POA: Diagnosis not present

## 2020-09-30 DIAGNOSIS — M1030 Gout due to renal impairment, unspecified site: Secondary | ICD-10-CM | POA: Diagnosis not present

## 2020-09-30 DIAGNOSIS — I272 Pulmonary hypertension, unspecified: Secondary | ICD-10-CM | POA: Diagnosis not present

## 2020-09-30 DIAGNOSIS — I129 Hypertensive chronic kidney disease with stage 1 through stage 4 chronic kidney disease, or unspecified chronic kidney disease: Secondary | ICD-10-CM | POA: Diagnosis not present

## 2020-09-30 DIAGNOSIS — N1832 Chronic kidney disease, stage 3b: Secondary | ICD-10-CM | POA: Diagnosis not present

## 2020-09-30 DIAGNOSIS — L89152 Pressure ulcer of sacral region, stage 2: Secondary | ICD-10-CM | POA: Diagnosis not present

## 2020-10-03 ENCOUNTER — Telehealth: Payer: Self-pay | Admitting: Internal Medicine

## 2020-10-03 ENCOUNTER — Other Ambulatory Visit: Payer: Self-pay

## 2020-10-03 ENCOUNTER — Ambulatory Visit: Payer: Medicare PPO | Admitting: Orthotics

## 2020-10-03 DIAGNOSIS — M79675 Pain in left toe(s): Secondary | ICD-10-CM

## 2020-10-03 DIAGNOSIS — E785 Hyperlipidemia, unspecified: Secondary | ICD-10-CM

## 2020-10-03 DIAGNOSIS — N1832 Chronic kidney disease, stage 3b: Secondary | ICD-10-CM | POA: Diagnosis not present

## 2020-10-03 DIAGNOSIS — H409 Unspecified glaucoma: Secondary | ICD-10-CM

## 2020-10-03 DIAGNOSIS — Z87440 Personal history of urinary (tract) infections: Secondary | ICD-10-CM

## 2020-10-03 DIAGNOSIS — I272 Pulmonary hypertension, unspecified: Secondary | ICD-10-CM | POA: Diagnosis not present

## 2020-10-03 DIAGNOSIS — K8020 Calculus of gallbladder without cholecystitis without obstruction: Secondary | ICD-10-CM

## 2020-10-03 DIAGNOSIS — D631 Anemia in chronic kidney disease: Secondary | ICD-10-CM | POA: Diagnosis not present

## 2020-10-03 DIAGNOSIS — I129 Hypertensive chronic kidney disease with stage 1 through stage 4 chronic kidney disease, or unspecified chronic kidney disease: Secondary | ICD-10-CM | POA: Diagnosis not present

## 2020-10-03 DIAGNOSIS — Z96652 Presence of left artificial knee joint: Secondary | ICD-10-CM

## 2020-10-03 DIAGNOSIS — L89152 Pressure ulcer of sacral region, stage 2: Secondary | ICD-10-CM | POA: Diagnosis not present

## 2020-10-03 DIAGNOSIS — D696 Thrombocytopenia, unspecified: Secondary | ICD-10-CM

## 2020-10-03 DIAGNOSIS — M810 Age-related osteoporosis without current pathological fracture: Secondary | ICD-10-CM

## 2020-10-03 DIAGNOSIS — S72121D Displaced fracture of lesser trochanter of right femur, subsequent encounter for closed fracture with routine healing: Secondary | ICD-10-CM | POA: Diagnosis not present

## 2020-10-03 DIAGNOSIS — M778 Other enthesopathies, not elsewhere classified: Secondary | ICD-10-CM

## 2020-10-03 DIAGNOSIS — M8588 Other specified disorders of bone density and structure, other site: Secondary | ICD-10-CM

## 2020-10-03 DIAGNOSIS — B351 Tinea unguium: Secondary | ICD-10-CM

## 2020-10-03 DIAGNOSIS — D62 Acute posthemorrhagic anemia: Secondary | ICD-10-CM

## 2020-10-03 DIAGNOSIS — E1142 Type 2 diabetes mellitus with diabetic polyneuropathy: Secondary | ICD-10-CM

## 2020-10-03 DIAGNOSIS — F329 Major depressive disorder, single episode, unspecified: Secondary | ICD-10-CM

## 2020-10-03 DIAGNOSIS — Z981 Arthrodesis status: Secondary | ICD-10-CM

## 2020-10-03 DIAGNOSIS — Z9181 History of falling: Secondary | ICD-10-CM

## 2020-10-03 DIAGNOSIS — Z8673 Personal history of transient ischemic attack (TIA), and cerebral infarction without residual deficits: Secondary | ICD-10-CM

## 2020-10-03 DIAGNOSIS — M79674 Pain in right toe(s): Secondary | ICD-10-CM

## 2020-10-03 DIAGNOSIS — S72141D Displaced intertrochanteric fracture of right femur, subsequent encounter for closed fracture with routine healing: Secondary | ICD-10-CM | POA: Diagnosis not present

## 2020-10-03 DIAGNOSIS — Z7982 Long term (current) use of aspirin: Secondary | ICD-10-CM

## 2020-10-03 DIAGNOSIS — K5790 Diverticulosis of intestine, part unspecified, without perforation or abscess without bleeding: Secondary | ICD-10-CM

## 2020-10-03 DIAGNOSIS — Z7984 Long term (current) use of oral hypoglycemic drugs: Secondary | ICD-10-CM

## 2020-10-03 DIAGNOSIS — Z79899 Other long term (current) drug therapy: Secondary | ICD-10-CM

## 2020-10-03 DIAGNOSIS — M1030 Gout due to renal impairment, unspecified site: Secondary | ICD-10-CM | POA: Diagnosis not present

## 2020-10-03 DIAGNOSIS — E1122 Type 2 diabetes mellitus with diabetic chronic kidney disease: Secondary | ICD-10-CM | POA: Diagnosis not present

## 2020-10-03 DIAGNOSIS — K219 Gastro-esophageal reflux disease without esophagitis: Secondary | ICD-10-CM

## 2020-10-03 NOTE — Telephone Encounter (Signed)
Family is requesting home health services for 8 hours per week. They were wanting to know how to get that for the patient. Colletta Maryland can be reached at [phone]

## 2020-10-03 NOTE — Progress Notes (Signed)
Reorder surefit manila 7.5 W Confirm [phone]

## 2020-10-04 DIAGNOSIS — I272 Pulmonary hypertension, unspecified: Secondary | ICD-10-CM | POA: Diagnosis not present

## 2020-10-04 DIAGNOSIS — I129 Hypertensive chronic kidney disease with stage 1 through stage 4 chronic kidney disease, or unspecified chronic kidney disease: Secondary | ICD-10-CM | POA: Diagnosis not present

## 2020-10-04 DIAGNOSIS — S72141D Displaced intertrochanteric fracture of right femur, subsequent encounter for closed fracture with routine healing: Secondary | ICD-10-CM | POA: Diagnosis not present

## 2020-10-04 DIAGNOSIS — S72121D Displaced fracture of lesser trochanter of right femur, subsequent encounter for closed fracture with routine healing: Secondary | ICD-10-CM | POA: Diagnosis not present

## 2020-10-04 DIAGNOSIS — N1832 Chronic kidney disease, stage 3b: Secondary | ICD-10-CM | POA: Diagnosis not present

## 2020-10-04 DIAGNOSIS — L89152 Pressure ulcer of sacral region, stage 2: Secondary | ICD-10-CM | POA: Diagnosis not present

## 2020-10-04 DIAGNOSIS — E1122 Type 2 diabetes mellitus with diabetic chronic kidney disease: Secondary | ICD-10-CM | POA: Diagnosis not present

## 2020-10-04 DIAGNOSIS — M1030 Gout due to renal impairment, unspecified site: Secondary | ICD-10-CM | POA: Diagnosis not present

## 2020-10-04 DIAGNOSIS — D631 Anemia in chronic kidney disease: Secondary | ICD-10-CM | POA: Diagnosis not present

## 2020-10-04 NOTE — Telephone Encounter (Signed)
Called Colletta Maryland which is pt granddaughter. Verified if they already have home health. Colletta Maryland states no.. she states since mom has broken her hip she need help far as getting dressed and ADL's.  Inform Colletta Maryland pt will need to make appt w/MD to be evaluated and he can them place the referral for which she will need. Inform her MD is out and not back until 10/22. Made appt for 10/20/20 w/Dr. Camila Li.Marland KitchenJohny Chess

## 2020-10-04 NOTE — Telephone Encounter (Signed)
Does she already have home health in place? I am not sure who Colletta Maryland is?  If they are trying to get this started, would recommend OV with Dr. Alain Marion at his first opening to discuss so he can start the process for her.

## 2020-10-05 DIAGNOSIS — Z9841 Cataract extraction status, right eye: Secondary | ICD-10-CM | POA: Diagnosis not present

## 2020-10-05 DIAGNOSIS — Z947 Corneal transplant status: Secondary | ICD-10-CM | POA: Diagnosis not present

## 2020-10-05 DIAGNOSIS — H401133 Primary open-angle glaucoma, bilateral, severe stage: Secondary | ICD-10-CM | POA: Diagnosis not present

## 2020-10-05 DIAGNOSIS — Z961 Presence of intraocular lens: Secondary | ICD-10-CM | POA: Diagnosis not present

## 2020-10-11 ENCOUNTER — Ambulatory Visit: Payer: Medicare PPO

## 2020-10-12 ENCOUNTER — Ambulatory Visit: Payer: Medicare PPO | Admitting: Internal Medicine

## 2020-10-12 DIAGNOSIS — M1030 Gout due to renal impairment, unspecified site: Secondary | ICD-10-CM | POA: Diagnosis not present

## 2020-10-12 DIAGNOSIS — I129 Hypertensive chronic kidney disease with stage 1 through stage 4 chronic kidney disease, or unspecified chronic kidney disease: Secondary | ICD-10-CM | POA: Diagnosis not present

## 2020-10-12 DIAGNOSIS — D631 Anemia in chronic kidney disease: Secondary | ICD-10-CM | POA: Diagnosis not present

## 2020-10-12 DIAGNOSIS — S72121D Displaced fracture of lesser trochanter of right femur, subsequent encounter for closed fracture with routine healing: Secondary | ICD-10-CM | POA: Diagnosis not present

## 2020-10-12 DIAGNOSIS — I272 Pulmonary hypertension, unspecified: Secondary | ICD-10-CM | POA: Diagnosis not present

## 2020-10-12 DIAGNOSIS — E1122 Type 2 diabetes mellitus with diabetic chronic kidney disease: Secondary | ICD-10-CM | POA: Diagnosis not present

## 2020-10-12 DIAGNOSIS — S72141D Displaced intertrochanteric fracture of right femur, subsequent encounter for closed fracture with routine healing: Secondary | ICD-10-CM | POA: Diagnosis not present

## 2020-10-12 DIAGNOSIS — N1832 Chronic kidney disease, stage 3b: Secondary | ICD-10-CM | POA: Diagnosis not present

## 2020-10-12 DIAGNOSIS — L89152 Pressure ulcer of sacral region, stage 2: Secondary | ICD-10-CM | POA: Diagnosis not present

## 2020-10-15 DIAGNOSIS — I129 Hypertensive chronic kidney disease with stage 1 through stage 4 chronic kidney disease, or unspecified chronic kidney disease: Secondary | ICD-10-CM | POA: Diagnosis not present

## 2020-10-15 DIAGNOSIS — S72121D Displaced fracture of lesser trochanter of right femur, subsequent encounter for closed fracture with routine healing: Secondary | ICD-10-CM | POA: Diagnosis not present

## 2020-10-15 DIAGNOSIS — I272 Pulmonary hypertension, unspecified: Secondary | ICD-10-CM | POA: Diagnosis not present

## 2020-10-15 DIAGNOSIS — N1832 Chronic kidney disease, stage 3b: Secondary | ICD-10-CM | POA: Diagnosis not present

## 2020-10-15 DIAGNOSIS — D631 Anemia in chronic kidney disease: Secondary | ICD-10-CM | POA: Diagnosis not present

## 2020-10-15 DIAGNOSIS — M1030 Gout due to renal impairment, unspecified site: Secondary | ICD-10-CM | POA: Diagnosis not present

## 2020-10-15 DIAGNOSIS — E1142 Type 2 diabetes mellitus with diabetic polyneuropathy: Secondary | ICD-10-CM | POA: Diagnosis not present

## 2020-10-15 DIAGNOSIS — S72141D Displaced intertrochanteric fracture of right femur, subsequent encounter for closed fracture with routine healing: Secondary | ICD-10-CM | POA: Diagnosis not present

## 2020-10-15 DIAGNOSIS — E1122 Type 2 diabetes mellitus with diabetic chronic kidney disease: Secondary | ICD-10-CM | POA: Diagnosis not present

## 2020-10-17 DIAGNOSIS — I272 Pulmonary hypertension, unspecified: Secondary | ICD-10-CM | POA: Diagnosis not present

## 2020-10-17 DIAGNOSIS — E1142 Type 2 diabetes mellitus with diabetic polyneuropathy: Secondary | ICD-10-CM | POA: Diagnosis not present

## 2020-10-17 DIAGNOSIS — I129 Hypertensive chronic kidney disease with stage 1 through stage 4 chronic kidney disease, or unspecified chronic kidney disease: Secondary | ICD-10-CM | POA: Diagnosis not present

## 2020-10-17 DIAGNOSIS — S72121D Displaced fracture of lesser trochanter of right femur, subsequent encounter for closed fracture with routine healing: Secondary | ICD-10-CM | POA: Diagnosis not present

## 2020-10-17 DIAGNOSIS — M1030 Gout due to renal impairment, unspecified site: Secondary | ICD-10-CM | POA: Diagnosis not present

## 2020-10-17 DIAGNOSIS — E1122 Type 2 diabetes mellitus with diabetic chronic kidney disease: Secondary | ICD-10-CM | POA: Diagnosis not present

## 2020-10-17 DIAGNOSIS — N1832 Chronic kidney disease, stage 3b: Secondary | ICD-10-CM | POA: Diagnosis not present

## 2020-10-17 DIAGNOSIS — S72141D Displaced intertrochanteric fracture of right femur, subsequent encounter for closed fracture with routine healing: Secondary | ICD-10-CM | POA: Diagnosis not present

## 2020-10-17 DIAGNOSIS — D631 Anemia in chronic kidney disease: Secondary | ICD-10-CM | POA: Diagnosis not present

## 2020-10-20 ENCOUNTER — Ambulatory Visit: Payer: Medicare PPO | Admitting: Internal Medicine

## 2020-10-20 ENCOUNTER — Encounter: Payer: Self-pay | Admitting: Internal Medicine

## 2020-10-20 ENCOUNTER — Other Ambulatory Visit: Payer: Self-pay

## 2020-10-20 VITALS — BP 142/84 | HR 57 | Temp 97.7°F | Wt 136.8 lb

## 2020-10-20 DIAGNOSIS — M5440 Lumbago with sciatica, unspecified side: Secondary | ICD-10-CM

## 2020-10-20 DIAGNOSIS — M1030 Gout due to renal impairment, unspecified site: Secondary | ICD-10-CM | POA: Diagnosis not present

## 2020-10-20 DIAGNOSIS — Z23 Encounter for immunization: Secondary | ICD-10-CM | POA: Diagnosis not present

## 2020-10-20 DIAGNOSIS — I10 Essential (primary) hypertension: Secondary | ICD-10-CM

## 2020-10-20 DIAGNOSIS — G8929 Other chronic pain: Secondary | ICD-10-CM | POA: Diagnosis not present

## 2020-10-20 DIAGNOSIS — E1142 Type 2 diabetes mellitus with diabetic polyneuropathy: Secondary | ICD-10-CM | POA: Diagnosis not present

## 2020-10-20 DIAGNOSIS — I129 Hypertensive chronic kidney disease with stage 1 through stage 4 chronic kidney disease, or unspecified chronic kidney disease: Secondary | ICD-10-CM | POA: Diagnosis not present

## 2020-10-20 DIAGNOSIS — R634 Abnormal weight loss: Secondary | ICD-10-CM | POA: Diagnosis not present

## 2020-10-20 DIAGNOSIS — S72141D Displaced intertrochanteric fracture of right femur, subsequent encounter for closed fracture with routine healing: Secondary | ICD-10-CM | POA: Diagnosis not present

## 2020-10-20 DIAGNOSIS — I272 Pulmonary hypertension, unspecified: Secondary | ICD-10-CM | POA: Diagnosis not present

## 2020-10-20 DIAGNOSIS — N1832 Chronic kidney disease, stage 3b: Secondary | ICD-10-CM | POA: Diagnosis not present

## 2020-10-20 DIAGNOSIS — D631 Anemia in chronic kidney disease: Secondary | ICD-10-CM | POA: Diagnosis not present

## 2020-10-20 DIAGNOSIS — S72121D Displaced fracture of lesser trochanter of right femur, subsequent encounter for closed fracture with routine healing: Secondary | ICD-10-CM | POA: Diagnosis not present

## 2020-10-20 DIAGNOSIS — E1122 Type 2 diabetes mellitus with diabetic chronic kidney disease: Secondary | ICD-10-CM | POA: Diagnosis not present

## 2020-10-20 DIAGNOSIS — E1121 Type 2 diabetes mellitus with diabetic nephropathy: Secondary | ICD-10-CM | POA: Diagnosis not present

## 2020-10-20 LAB — COMPREHENSIVE METABOLIC PANEL
ALT: 8 U/L (ref 0–35)
AST: 18 U/L (ref 0–37)
Albumin: 3.7 g/dL (ref 3.5–5.2)
Alkaline Phosphatase: 151 U/L — ABNORMAL HIGH (ref 39–117)
BUN: 18 mg/dL (ref 6–23)
CO2: 29 mEq/L (ref 19–32)
Calcium: 9.4 mg/dL (ref 8.4–10.5)
Chloride: 104 mEq/L (ref 96–112)
Creatinine, Ser: 1.07 mg/dL (ref 0.40–1.20)
GFR: 46.82 mL/min — ABNORMAL LOW (ref >60.00)
Glucose, Bld: 89 mg/dL (ref 70–99)
Potassium: 4.1 mEq/L (ref 3.5–5.1)
Sodium: 141 mEq/L (ref 135–145)
Total Bilirubin: 0.3 mg/dL (ref 0.2–1.2)
Total Protein: 6.8 g/dL (ref 6.0–8.3)

## 2020-10-20 LAB — HEMOGLOBIN A1C: Hgb A1c MFr Bld: 6.2 % (ref 4.6–6.5)

## 2020-10-20 NOTE — Assessment & Plan Note (Signed)
Tylenol prn 

## 2020-10-20 NOTE — Assessment & Plan Note (Signed)
Coreg, Amlodipine, Losartan NAS diet Add Spironolactone 5/20

## 2020-10-20 NOTE — Assessment & Plan Note (Signed)
Metformin 

## 2020-10-20 NOTE — Progress Notes (Signed)
Subjective:  Patient ID: Breanna [name], female    DOB: 1931-10-30  Age: 86 y.o. MRN: [id number]  CC: Follow-up (Requesting referral for home health services)   HPI Breanna [name] presents for remote R hip fx, DM, CAD, HTN. Comes w/son. Breanna [name] needs help w/ADLs  Outpatient Medications Prior to Visit  Medication Sig Dispense Refill  . acetaminophen (TYLENOL) 325 MG tablet Take 2 tablets (650 mg total) by mouth every 6 (six) hours as needed for moderate pain or fever.    Marland Kitchen allopurinol (ZYLOPRIM) 300 MG tablet Take 0.5 tablets (150 mg total) by mouth daily. 90 tablet 3  . amLODipine (NORVASC) 10 MG tablet Take 1 tablet (10 mg total) by mouth daily.    Marland Kitchen amLODipine (NORVASC) 5 MG tablet     . aspirin EC 81 MG EC tablet Take 1 tablet (81 mg total) by mouth 2 (two) times daily. Swallow whole. 30 tablet 11  . atropine 1 % ophthalmic solution Place 1 drop into the left eye daily as needed (Dry/irritated eyes).     . CALCIUM PO Take 1 tablet by mouth daily.     . carvedilol (COREG) 25 MG tablet Take 1 tablet (25 mg total) by mouth 2 (two) times daily with a meal. 180 tablet 3  . Cholecalciferol (VITAMIN D3) 2000 UNITS capsule Take 2,000 Units by mouth daily.      . cyanocobalamin 2000 MCG tablet Take 2,000 mcg by mouth daily.    . diclofenac sodium (VOLTAREN) 1 % GEL Apply 2 g topically 2 (two) times daily. 200 g 3  . docusate sodium (COLACE) 100 MG capsule Take 1 capsule (100 mg total) by mouth 2 (two) times daily. 60 capsule 11  . dorzolamide-timolol (COSOPT) 22.3-6.8 MG/ML ophthalmic solution Place 1 drop into both eyes 2 (two) times daily.     . feeding supplement, ENSURE ENLIVE, (ENSURE ENLIVE) LIQD Take 237 mLs by mouth daily at 2 PM. 237 mL 12  . feeding supplement, GLUCERNA SHAKE, (GLUCERNA SHAKE) LIQD Take 237 mLs by mouth 2 (two) times daily between meals.  0  . gabapentin (NEURONTIN) 100 MG capsule Take 1 capsule (100 mg total) by mouth 3 (three) times daily. 270 capsule 3  .  latanoprost (XALATAN) 0.005 % ophthalmic solution Place 1 drop into both eyes at bedtime.     Marland Kitchen losartan (COZAAR) 25 MG tablet     . metFORMIN (GLUCOPHAGE) 500 MG tablet Take 1 tablet (500 mg total) by mouth daily with breakfast. 90 tablet 3  . NON FORMULARY Place 10 drops under the tongue daily as needed (Pain). CBD oil     . pantoprazole (PROTONIX) 40 MG tablet Take 1 tablet (40 mg total) by mouth daily. 90 tablet 3  . polyethylene glycol (MIRALAX / GLYCOLAX) 17 g packet Take 17 g by mouth daily as needed for mild constipation. 14 each 0  . senna (SENOKOT) 8.6 MG TABS tablet Take 1 tablet (8.6 mg total) by mouth 2 (two) times daily. 120 tablet 0  . sertraline (ZOLOFT) 100 MG tablet Take 1 tablet (100 mg total) by mouth daily. 90 tablet 3  . spironolactone (ALDACTONE) 25 MG tablet Take by mouth.     No facility-administered medications prior to visit.    ROS: Review of Systems  Constitutional: Positive for fatigue. Negative for activity change, appetite change, chills and unexpected weight change.  HENT: Negative for congestion, mouth sores and sinus pressure.   Eyes: Negative for visual disturbance.  Respiratory: Negative  for cough and chest tightness.   Gastrointestinal: Negative for abdominal pain and nausea.  Genitourinary: Negative for difficulty urinating, frequency and vaginal pain.  Musculoskeletal: Positive for back pain and gait problem.  Skin: Negative for pallor and rash.  Neurological: Positive for weakness. Negative for dizziness, tremors, numbness and headaches.  Psychiatric/Behavioral: Negative for confusion and sleep disturbance.    Objective:  BP (!) 142/84 (BP Location: Left Arm)   Pulse (!) 57   Temp 97.7 F (36.5 C) (Oral)   Wt 136 lb 12.8 oz (62.1 kg)   SpO2 98%   BMI 23.48 kg/m   BP Readings from Last 3 Encounters:  10/20/20 (!) 142/84  08/18/20 (!) 160/82  07/29/20 137/71    Wt Readings from Last 3 Encounters:  10/20/20 136 lb 12.8 oz (62.1 kg)   08/18/20 138 lb (62.6 kg)  07/20/20 143 lb (64.9 kg)    Physical Exam Constitutional:      General: She is not in acute distress.    Appearance: She is well-developed. She is obese.  HENT:     Head: Normocephalic.     Right Ear: External ear normal.     Left Ear: External ear normal.     Nose: Nose normal.  Eyes:     General:        Right eye: No discharge.        Left eye: No discharge.     Conjunctiva/sclera: Conjunctivae normal.     Pupils: Pupils are equal, round, and reactive to light.  Neck:     Thyroid: No thyromegaly.     Vascular: No JVD.     Trachea: No tracheal deviation.  Cardiovascular:     Rate and Rhythm: Normal rate and regular rhythm.     Heart sounds: Normal heart sounds.  Pulmonary:     Effort: No respiratory distress.     Breath sounds: No stridor. No wheezing.  Abdominal:     General: Bowel sounds are normal. There is no distension.     Palpations: Abdomen is soft. There is no mass.     Tenderness: There is no abdominal tenderness. There is no guarding or rebound.  Musculoskeletal:        General: No tenderness.     Cervical back: Normal range of motion and neck supple.  Lymphadenopathy:     Cervical: No cervical adenopathy.  Skin:    Findings: No erythema or rash.  Neurological:     Mental Status: She is oriented to person, place, and time.     Cranial Nerves: No cranial nerve deficit.     Motor: No abnormal muscle tone.     Coordination: Coordination abnormal.     Gait: Gait abnormal.     Deep Tendon Reflexes: Reflexes normal.  Psychiatric:        Behavior: Behavior normal.        Thought Content: Thought content normal.        Judgment: Judgment normal.   walker   A total time of >45 minutes was spent preparing to see the patient, reviewing tests, x-rays, operative reports and outside records.  Also, obtaining history and performing comprehensive physical exam.  Additionally, counseling the patient regarding the above listed issues.    Finally, documenting clinical information in the health records, coordination of care, educating the patient. PCS form filled out.   Lab Results  Component Value Date   WBC 6.6 08/18/2020   HGB 10.8 (L) 08/18/2020   HCT 34.4 (L) 08/18/2020  PLT 178 08/18/2020   GLUCOSE 93 08/18/2020   CHOL 154 10/19/2014   TRIG 56.0 10/19/2014   HDL 61.10 10/19/2014   LDLCALC 82 10/19/2014   ALT 6 08/18/2020   AST 15 08/18/2020   NA 143 08/18/2020   K 4.8 08/18/2020   CL 109 08/18/2020   CREATININE 1.35 (H) 08/18/2020   BUN 26 (H) 08/18/2020   CO2 28 08/18/2020   TSH 1.26 07/06/2020   HGBA1C 6.1 (H) 07/06/2020    DG Chest 1 View  Result Date: 07/20/2020 CLINICAL DATA:  Recent fall with chest pain, initial encounter EXAM: CHEST  1 VIEW COMPARISON:  08/13/2017 FINDINGS: Cardiac shadow is enlarged accentuated by the frontal technique. The lungs are well aerated without focal infiltrate or sizable effusion. No acute bony abnormality is noted. IMPRESSION: No acute abnormality seen. Electronically Signed   By: Inez Catalina M.D.   On: 07/20/2020 21:51   DG Lumbar Spine 2-3 Views  Result Date: 07/21/2020 CLINICAL DATA:  Fall and back pain EXAM: LUMBAR SPINE - 2-3 VIEW COMPARISON:  June 04, 2016 FINDINGS: The patient is status post lumbar spine fixation from L3 through L5 with posterior spinal fixation hardware. No periprosthetic lucency or fracture is identified. Disc height loss with facet arthrosis is most notable at L5-S1. There is diffuse osteopenia. IMPRESSION: Status post lumbar spine fixation from L3 through L5 without complication. No definite acute fracture or malalignment. Electronically Signed   By: Prudencio Pair M.D.   On: 07/21/2020 01:15   DG Elbow Complete Right  Result Date: 07/20/2020 CLINICAL DATA:  Recent fall with elbow pain, initial encounter EXAM: RIGHT ELBOW - COMPLETE 3+ VIEW COMPARISON:  None. FINDINGS: No acute fracture or dislocation is noted. Mild olecranon spurring is seen. No  soft tissue abnormality is noted. IMPRESSION: No acute abnormality noted. Electronically Signed   By: Inez Catalina M.D.   On: 07/20/2020 21:50   CT Hip Right Wo Contrast  Result Date: 07/21/2020 CLINICAL DATA:  Fall fractured hip EXAM: CT OF THE RIGHT HIP WITHOUT CONTRAST TECHNIQUE: Multidetector CT imaging of the right hip was performed according to the standard protocol. Multiplanar CT image reconstructions were also generated. COMPARISON:  None. FINDINGS: Bones/Joint/Cartilage There is a comminuted mildly displaced fracture involving the right intratrochanteric region. The fracture line extends through the greater trochanter with a mildly displaced lesser trochanter. The femoral head is slightly rotated, however still well seated within the acetabulum. There is diffuse osteopenia noted. No other definite fracture is seen. Ligaments Suboptimally assessed by CT. Muscles and Tendons The muscles surrounding the hip appear to be grossly intact. The tendons appear to be intact. Soft tissues Mild soft tissue edema seen over the lateral aspect of the hip. The visualized deep pelvis is grossly unremarkable. IMPRESSION: Comminuted proximal right intratrochanteric femur fracture with mild displacement of the lesser trochanter. Electronically Signed   By: Prudencio Pair M.D.   On: 07/21/2020 00:25   DG C-Arm 1-60 Min  Result Date: 07/21/2020 CLINICAL DATA:  Right hip ORIF EXAM: RIGHT FEMUR 2 VIEWS; DG C-ARM 1-60 MIN COMPARISON:  X-ray 07/20/2020 FINDINGS: 3 C-arm fluoroscopic images were obtained intraoperatively and submitted for post operative interpretation. Interval placement of long IM rod with proximal lag screw traversing intertrochanteric fracture of the right femur with good anatomic alignment. 37 seconds of fluoroscopy time was utilized. Please see the performing provider's procedural report for further detail. IMPRESSION: As above. Electronically Signed   By: Davina Poke D.O.   On: 07/21/2020 13:41  DG Hip Unilat With Pelvis 2-3 Views Right  Result Date: 07/20/2020 CLINICAL DATA:  86 year old female with fall. EXAM: DG HIP (WITH OR WITHOUT PELVIS) 2-3V RIGHT COMPARISON:  None. FINDINGS: Evaluation is limited due to body habitus and osteopenia. There is a displaced fracture of the lesser trochanter of the right femur. There is a linear lucency through the base of the right femoral neck most consistent with a nondisplaced basicervical fracture. Further evaluation with CT is recommended. There is no dislocation. Partially visualized lower lumbar fusion hardware. Multiple surgical wires noted over the pelvis. The soft tissues are unremarkable. IMPRESSION: Nondisplaced basicervical fracture of the right femoral neck with displaced fracture of the lesser trochanter. Further evaluation with CT recommended. Electronically Signed   By: Anner Crete M.D.   On: 07/20/2020 21:50   DG FEMUR, MIN 2 VIEWS RIGHT  Result Date: 07/21/2020 CLINICAL DATA:  Right hip ORIF EXAM: RIGHT FEMUR 2 VIEWS; DG C-ARM 1-60 MIN COMPARISON:  X-ray 07/20/2020 FINDINGS: 3 C-arm fluoroscopic images were obtained intraoperatively and submitted for post operative interpretation. Interval placement of long IM rod with proximal lag screw traversing intertrochanteric fracture of the right femur with good anatomic alignment. 37 seconds of fluoroscopy time was utilized. Please see the performing provider's procedural report for further detail. IMPRESSION: As above. Electronically Signed   By: Davina Poke D.O.   On: 07/21/2020 13:41    Assessment & Plan:     Follow-up: No follow-ups on file.  Walker Kehr, MD

## 2020-10-20 NOTE — Assessment & Plan Note (Signed)
Wt Readings from Last 3 Encounters:  10/20/20 136 lb 12.8 oz (62.1 kg)  08/18/20 138 lb (62.6 kg)  07/20/20 143 lb (64.9 kg)

## 2020-10-24 DIAGNOSIS — M1030 Gout due to renal impairment, unspecified site: Secondary | ICD-10-CM | POA: Diagnosis not present

## 2020-10-24 DIAGNOSIS — S72141D Displaced intertrochanteric fracture of right femur, subsequent encounter for closed fracture with routine healing: Secondary | ICD-10-CM | POA: Diagnosis not present

## 2020-10-24 DIAGNOSIS — D631 Anemia in chronic kidney disease: Secondary | ICD-10-CM | POA: Diagnosis not present

## 2020-10-24 DIAGNOSIS — E1142 Type 2 diabetes mellitus with diabetic polyneuropathy: Secondary | ICD-10-CM | POA: Diagnosis not present

## 2020-10-24 DIAGNOSIS — I129 Hypertensive chronic kidney disease with stage 1 through stage 4 chronic kidney disease, or unspecified chronic kidney disease: Secondary | ICD-10-CM | POA: Diagnosis not present

## 2020-10-24 DIAGNOSIS — Z947 Corneal transplant status: Secondary | ICD-10-CM | POA: Diagnosis not present

## 2020-10-24 DIAGNOSIS — S72121D Displaced fracture of lesser trochanter of right femur, subsequent encounter for closed fracture with routine healing: Secondary | ICD-10-CM | POA: Diagnosis not present

## 2020-10-24 DIAGNOSIS — N1832 Chronic kidney disease, stage 3b: Secondary | ICD-10-CM | POA: Diagnosis not present

## 2020-10-24 DIAGNOSIS — I272 Pulmonary hypertension, unspecified: Secondary | ICD-10-CM | POA: Diagnosis not present

## 2020-10-24 DIAGNOSIS — H401133 Primary open-angle glaucoma, bilateral, severe stage: Secondary | ICD-10-CM | POA: Diagnosis not present

## 2020-10-24 DIAGNOSIS — E1122 Type 2 diabetes mellitus with diabetic chronic kidney disease: Secondary | ICD-10-CM | POA: Diagnosis not present

## 2020-10-26 ENCOUNTER — Telehealth: Payer: Self-pay | Admitting: Internal Medicine

## 2020-10-26 NOTE — Telephone Encounter (Signed)
  [facility] calling to request order for B12 1036mcg to package with other medications Phone [phone]

## 2020-10-27 DIAGNOSIS — D631 Anemia in chronic kidney disease: Secondary | ICD-10-CM | POA: Diagnosis not present

## 2020-10-27 DIAGNOSIS — I272 Pulmonary hypertension, unspecified: Secondary | ICD-10-CM | POA: Diagnosis not present

## 2020-10-27 DIAGNOSIS — S72121D Displaced fracture of lesser trochanter of right femur, subsequent encounter for closed fracture with routine healing: Secondary | ICD-10-CM | POA: Diagnosis not present

## 2020-10-27 DIAGNOSIS — M1030 Gout due to renal impairment, unspecified site: Secondary | ICD-10-CM | POA: Diagnosis not present

## 2020-10-27 DIAGNOSIS — E1122 Type 2 diabetes mellitus with diabetic chronic kidney disease: Secondary | ICD-10-CM | POA: Diagnosis not present

## 2020-10-27 DIAGNOSIS — E1142 Type 2 diabetes mellitus with diabetic polyneuropathy: Secondary | ICD-10-CM | POA: Diagnosis not present

## 2020-10-27 DIAGNOSIS — S72141D Displaced intertrochanteric fracture of right femur, subsequent encounter for closed fracture with routine healing: Secondary | ICD-10-CM | POA: Diagnosis not present

## 2020-10-27 DIAGNOSIS — N1832 Chronic kidney disease, stage 3b: Secondary | ICD-10-CM | POA: Diagnosis not present

## 2020-10-27 DIAGNOSIS — I129 Hypertensive chronic kidney disease with stage 1 through stage 4 chronic kidney disease, or unspecified chronic kidney disease: Secondary | ICD-10-CM | POA: Diagnosis not present

## 2020-10-31 DIAGNOSIS — S72121D Displaced fracture of lesser trochanter of right femur, subsequent encounter for closed fracture with routine healing: Secondary | ICD-10-CM | POA: Diagnosis not present

## 2020-10-31 DIAGNOSIS — S72141D Displaced intertrochanteric fracture of right femur, subsequent encounter for closed fracture with routine healing: Secondary | ICD-10-CM | POA: Diagnosis not present

## 2020-11-01 DIAGNOSIS — M1030 Gout due to renal impairment, unspecified site: Secondary | ICD-10-CM | POA: Diagnosis not present

## 2020-11-01 DIAGNOSIS — E1142 Type 2 diabetes mellitus with diabetic polyneuropathy: Secondary | ICD-10-CM | POA: Diagnosis not present

## 2020-11-01 DIAGNOSIS — N1832 Chronic kidney disease, stage 3b: Secondary | ICD-10-CM | POA: Diagnosis not present

## 2020-11-01 DIAGNOSIS — D631 Anemia in chronic kidney disease: Secondary | ICD-10-CM | POA: Diagnosis not present

## 2020-11-01 DIAGNOSIS — S72121D Displaced fracture of lesser trochanter of right femur, subsequent encounter for closed fracture with routine healing: Secondary | ICD-10-CM | POA: Diagnosis not present

## 2020-11-01 DIAGNOSIS — S72141D Displaced intertrochanteric fracture of right femur, subsequent encounter for closed fracture with routine healing: Secondary | ICD-10-CM | POA: Diagnosis not present

## 2020-11-01 DIAGNOSIS — I129 Hypertensive chronic kidney disease with stage 1 through stage 4 chronic kidney disease, or unspecified chronic kidney disease: Secondary | ICD-10-CM | POA: Diagnosis not present

## 2020-11-01 DIAGNOSIS — I272 Pulmonary hypertension, unspecified: Secondary | ICD-10-CM | POA: Diagnosis not present

## 2020-11-01 DIAGNOSIS — E1122 Type 2 diabetes mellitus with diabetic chronic kidney disease: Secondary | ICD-10-CM | POA: Diagnosis not present

## 2020-11-07 ENCOUNTER — Ambulatory Visit: Payer: Medicare PPO | Admitting: Orthotics

## 2020-11-09 DIAGNOSIS — E1142 Type 2 diabetes mellitus with diabetic polyneuropathy: Secondary | ICD-10-CM | POA: Diagnosis not present

## 2020-11-09 DIAGNOSIS — S72121D Displaced fracture of lesser trochanter of right femur, subsequent encounter for closed fracture with routine healing: Secondary | ICD-10-CM | POA: Diagnosis not present

## 2020-11-09 DIAGNOSIS — I272 Pulmonary hypertension, unspecified: Secondary | ICD-10-CM | POA: Diagnosis not present

## 2020-11-09 DIAGNOSIS — N1832 Chronic kidney disease, stage 3b: Secondary | ICD-10-CM | POA: Diagnosis not present

## 2020-11-09 DIAGNOSIS — D631 Anemia in chronic kidney disease: Secondary | ICD-10-CM | POA: Diagnosis not present

## 2020-11-09 DIAGNOSIS — S72141D Displaced intertrochanteric fracture of right femur, subsequent encounter for closed fracture with routine healing: Secondary | ICD-10-CM | POA: Diagnosis not present

## 2020-11-09 DIAGNOSIS — E1122 Type 2 diabetes mellitus with diabetic chronic kidney disease: Secondary | ICD-10-CM | POA: Diagnosis not present

## 2020-11-09 DIAGNOSIS — I129 Hypertensive chronic kidney disease with stage 1 through stage 4 chronic kidney disease, or unspecified chronic kidney disease: Secondary | ICD-10-CM | POA: Diagnosis not present

## 2020-11-09 DIAGNOSIS — M1030 Gout due to renal impairment, unspecified site: Secondary | ICD-10-CM | POA: Diagnosis not present

## 2020-11-14 DIAGNOSIS — Z947 Corneal transplant status: Secondary | ICD-10-CM | POA: Diagnosis not present

## 2020-11-14 DIAGNOSIS — Z9841 Cataract extraction status, right eye: Secondary | ICD-10-CM | POA: Diagnosis not present

## 2020-11-14 DIAGNOSIS — Z961 Presence of intraocular lens: Secondary | ICD-10-CM | POA: Diagnosis not present

## 2020-11-14 DIAGNOSIS — H401133 Primary open-angle glaucoma, bilateral, severe stage: Secondary | ICD-10-CM | POA: Diagnosis not present

## 2020-11-15 ENCOUNTER — Ambulatory Visit: Payer: Medicare PPO | Admitting: Internal Medicine

## 2020-11-16 ENCOUNTER — Other Ambulatory Visit: Payer: Self-pay | Admitting: *Deleted

## 2020-11-16 MED ORDER — GABAPENTIN 100 MG PO CAPS: 100.0000 mg | ORAL_CAPSULE | Freq: Three times a day (TID) | ORAL | 3 refills | Status: DC

## 2020-11-21 ENCOUNTER — Telehealth: Payer: Self-pay | Admitting: Internal Medicine

## 2020-11-21 MED ORDER — GABAPENTIN 100 MG PO CAPS: 100.0000 mg | ORAL_CAPSULE | Freq: Three times a day (TID) | ORAL | 3 refills | Status: DC

## 2020-11-21 NOTE — Telephone Encounter (Signed)
Patient received losartan (COZAAR) 25 MG tablet from   [id number] [place], [place] - [address] AT [employer] Phone:  [phone]  Fax:  [phone]     The script says 1 25mg  tablet per day, but patient states she was increased to 50mg  (25mg  twice daily) in August 2021  Please contact patient's son [name] @ [phone] to confirm As well as correcting the information with the pharmacy if needed

## 2020-11-21 NOTE — Telephone Encounter (Signed)
Pls advise on Losartan. Per chart there is no sig,and looking back at ov notes it states just to continue taking.Marland KitchenJohny [name]

## 2020-11-21 NOTE — Addendum Note (Signed)
Addended by: Earnstine Regal on: 11/21/2020 11:19 AM   Modules accepted: Orders

## 2020-11-21 NOTE — Telephone Encounter (Signed)
RESENT TO CORRECT [facility] IN [place].Marland KitchenLMB

## 2020-11-21 NOTE — Addendum Note (Signed)
Addended by: Earnstine Regal on: 11/21/2020 04:22 PM   Modules accepted: Orders

## 2020-11-22 ENCOUNTER — Encounter: Payer: Self-pay | Admitting: Internal Medicine

## 2020-11-22 ENCOUNTER — Other Ambulatory Visit: Payer: Self-pay

## 2020-11-22 ENCOUNTER — Ambulatory Visit
Admission: RE | Admit: 2020-11-22 | Discharge: 2020-11-22 | Disposition: A | Discharge status: Home / Self Care | Payer: Medicare PPO | Source: Ambulatory Visit | Attending: Internal Medicine | Admitting: Internal Medicine

## 2020-11-22 DIAGNOSIS — Z1231 Encounter for screening mammogram for malignant neoplasm of breast: Secondary | ICD-10-CM

## 2020-11-22 MED ORDER — LOSARTAN POTASSIUM 25 MG PO TABS: 25.0000 mg | ORAL_TABLET | Freq: Two times a day (BID) | ORAL | 3 refills | Status: DC

## 2020-11-22 NOTE — Telephone Encounter (Signed)
error 

## 2020-11-22 NOTE — Telephone Encounter (Signed)
Okay 25 mg losartan twice daily.  The chart entry was corrected.  Thanks

## 2020-11-23 MED ORDER — LOSARTAN POTASSIUM 25 MG PO TABS: 25.0000 mg | ORAL_TABLET | Freq: Two times a day (BID) | ORAL | 3 refills | Status: DC

## 2020-11-23 NOTE — Telephone Encounter (Signed)
Notified pt sone (Breanna [name]) w/[facility] response. [facility] [place].Marland KitchenJohny Chess

## 2020-12-08 ENCOUNTER — Ambulatory Visit: Payer: Medicare PPO | Admitting: Sports Medicine

## 2020-12-08 ENCOUNTER — Encounter: Payer: Self-pay | Admitting: Sports Medicine

## 2020-12-08 ENCOUNTER — Other Ambulatory Visit: Payer: Self-pay

## 2020-12-08 DIAGNOSIS — M2042 Other hammer toe(s) (acquired), left foot: Secondary | ICD-10-CM

## 2020-12-08 DIAGNOSIS — E119 Type 2 diabetes mellitus without complications: Secondary | ICD-10-CM

## 2020-12-08 DIAGNOSIS — B351 Tinea unguium: Secondary | ICD-10-CM

## 2020-12-08 DIAGNOSIS — M79675 Pain in left toe(s): Secondary | ICD-10-CM

## 2020-12-08 DIAGNOSIS — M2041 Other hammer toe(s) (acquired), right foot: Secondary | ICD-10-CM

## 2020-12-08 DIAGNOSIS — M79674 Pain in right toe(s): Secondary | ICD-10-CM

## 2020-12-08 DIAGNOSIS — M79671 Pain in right foot: Secondary | ICD-10-CM

## 2020-12-08 DIAGNOSIS — L84 Corns and callosities: Secondary | ICD-10-CM

## 2020-12-08 NOTE — Progress Notes (Signed)
Subjective: Breanna [name] is a 86 y.o. female patient with history of diabetes who returns to office today complaining of long,mildly painful nails and callus to toes while ambulating in shoes; unable to trim.  Patient reports that she has some right foot pain worse at night or end of day in foot and also wants to know if her shoes are in. No other pedal complaints.  Patient Active Problem List   Diagnosis Date Noted  . Anemia 08/18/2020  . Constipation 08/18/2020  . Hip fracture requiring operative repair ([place]) 07/21/2020  . Sinus bradycardia 07/21/2020  . Hip pain 07/06/2020  . Fall against object 12/25/2018  . Head contusion 12/25/2018  . Acute pain of right knee 09/28/2018  . Well adult exam 05/13/2018  . Herpes labialis 05/13/2018  . Night sweats 04/04/2018  . Breast symptom 10/30/2017  . Diarrhea 08/21/2017  . UTI (urinary tract infection) 08/21/2017  . Lung nodule 08/21/2017  . FTT (failure to thrive) in adult 08/21/2017  . Shortness of breath 08/13/2017  . Cough 08/13/2017  . Fatigue 08/13/2017  . Fever 08/13/2017  . Blurry vision 08/13/2017  . Shoulder pain, right 07/25/2017  . Callus of foot 06/07/2017  . Need for prophylactic vaccination and inoculation against influenza 11/21/2016  . Trigger finger, acquired 11/12/2016  . Meteorism 07/11/2016  . Dark stools 01/10/2016  . Osteoporosis 07/13/2015  . Fall at home 01/13/2015  . Cornea disorder 11/22/2014  . Primary open angle glaucoma of both eyes, indeterminate stage 11/22/2014  . Secondary corneal edema 10/14/2014  . Swelling of left knee joint 04/07/2014  . Grief 01/04/2014  . Syncope 01/04/2014  . Thoracic spine pain 10/22/2013  . Hypernatremia 07/07/2013  . Viral intestinal infection 03/25/2013  . Dehydration, moderate 03/24/2013  . Food poisoning 03/24/2013  . Headache(784.0) 12/22/2012  . URI (upper respiratory infection) 10/21/2012  . Diabetic neuropathy ([place]) 07/30/2012  . Neck pain on right side  07/30/2012  . Cerumen impaction 07/17/2012  . Cystoid macular edema 06/19/2012  . History of corneal transplant 06/19/2012  . Weight loss 04/10/2012  . Wrist pain, right 04/06/2011  . PARESTHESIA 01/03/2011  . FULL INCONTINENCE OF FECES 01/01/2011  . ABDOMINAL PAIN RIGHT UPPER QUADRANT 01/01/2011  . LEG PAIN, LEFT 10/02/2010  . Cerebral artery occlusion with cerebral infarction ([place]) 03/27/2010  . GERD 03/27/2010  . DIVERTICULOSIS-COLON 03/27/2010  . CHOLELITHIASIS 03/27/2010  . LOW BACK PAIN 09/07/2009  . ANEMIA, IRON DEFICIENCY, HX OF 06/14/2009  . DYSPHAGIA 05/16/2009  . Dysphagia 05/16/2009  . Dyslipidemia 11/06/2007  . Hyperlipidemia 11/06/2007  . Type 2 diabetes mellitus with renal manifestations, controlled ([place]) 07/29/2007  . Gout 07/29/2007  . Essential hypertension 07/29/2007  . HYDRONEPHROSIS 07/29/2007  . Osteoarthritis 07/29/2007  . COLONIC POLYPS, HX OF 07/29/2007  . DIVERTICULITIS, HX OF 07/29/2007   Current Outpatient Medications on File Prior to Visit  Medication Sig Dispense Refill  . acetaminophen (TYLENOL) 325 MG tablet Take 2 tablets (650 mg total) by mouth every 6 (six) hours as needed for moderate pain or fever.    Marland Kitchen allopurinol (ZYLOPRIM) 300 MG tablet Take 0.5 tablets (150 mg total) by mouth daily. 90 tablet 3  . amLODipine (NORVASC) 10 MG tablet Take 1 tablet (10 mg total) by mouth daily.    Marland Kitchen amLODipine (NORVASC) 5 MG tablet     . aspirin EC 81 MG EC tablet Take 1 tablet (81 mg total) by mouth 2 (two) times daily. Swallow whole. 30 tablet 11  . CALCIUM PO Take  1 tablet by mouth daily.     . carvedilol (COREG) 25 MG tablet Take 1 tablet (25 mg total) by mouth 2 (two) times daily with a meal. 180 tablet 3  . Cholecalciferol (VITAMIN D3) 2000 UNITS capsule Take 2,000 Units by mouth daily.      . cyanocobalamin 2000 MCG tablet Take 2,000 mcg by mouth daily.    . diclofenac sodium (VOLTAREN) 1 % GEL Apply 2 g topically 2 (two) times daily. 200 g 3  .  docusate sodium (COLACE) 100 MG capsule Take 1 capsule (100 mg total) by mouth 2 (two) times daily. 60 capsule 11  . dorzolamide-timolol (COSOPT) 22.3-6.8 MG/ML ophthalmic solution Place 1 drop into both eyes 2 (two) times daily.     . feeding supplement, ENSURE ENLIVE, (ENSURE ENLIVE) LIQD Take 237 mLs by mouth daily at 2 PM. 237 mL 12  . feeding supplement, GLUCERNA SHAKE, (GLUCERNA SHAKE) LIQD Take 237 mLs by mouth 2 (two) times daily between meals.  0  . gabapentin (NEURONTIN) 100 MG capsule Take 1 capsule (100 mg total) by mouth 3 (three) times daily. 270 capsule 3  . latanoprost (XALATAN) 0.005 % ophthalmic solution Place 1 drop into both eyes at bedtime.     Marland Kitchen losartan (COZAAR) 25 MG tablet Take 1 tablet (25 mg total) by mouth in the morning and at bedtime. 180 tablet 3  . metFORMIN (GLUCOPHAGE) 500 MG tablet Take 1 tablet (500 mg total) by mouth daily with breakfast. 90 tablet 3  . NON FORMULARY Place 10 drops under the tongue daily as needed (Pain). CBD oil     . pantoprazole (PROTONIX) 40 MG tablet Take 1 tablet (40 mg total) by mouth daily. 90 tablet 3  . polyethylene glycol (MIRALAX / GLYCOLAX) 17 g packet Take 17 g by mouth daily as needed for mild constipation. 14 each 0  . senna (SENOKOT) 8.6 MG TABS tablet Take 1 tablet (8.6 mg total) by mouth 2 (two) times daily. 120 tablet 0  . sertraline (ZOLOFT) 100 MG tablet Take 1 tablet (100 mg total) by mouth daily. 90 tablet 3  . spironolactone (ALDACTONE) 25 MG tablet Take by mouth.     No current facility-administered medications on file prior to visit.   Allergies  Allergen Reactions  . Verapamil Shortness Of Breath    REACTION: SOB  . Aspirin Itching    But tolerates low dose  . Atenolol     REACTION: fatigue  . Codeine Itching  . Codeine Sulfate   . Hydrochlorothiazide     REACTION: gout  . Hydrocodone       side effects - hallucinations  . Ibuprofen     Upset stomach w/high doses  . Lisinopril     REACTION: cough  .  Onion Other (See Comments)    Dry mouth/ gets sores  . Shellfish Allergy Swelling    Patient stated she does not eat shellfish, she swells up on different parts of the body  . Valsartan Itching  . Adhesive  [Tape] Rash      Objective: General: Patient is awake, alert, and oriented x 3 and in no acute distress.  Integument: Skin is warm, dry and supple bilateral. Nails are tender, long, thickened and dystrophic with subungual debris, consistent with onychomycosis, 1-5 on left and 1,3-5 on right. No signs of infection. Minimal callus at distal tuft at 3rd toes bilateral. Remaining integument unremarkable.  Neurovascular status unchanged since last visit.  Musculoskeletal: No reproducible pain to right  lateral foot at 5th met base but patient admits pain that wakes her up at night. Asymptomatic hammertoe and pes planus pedal deformities noted bilateral. Muscular strength 5/5 in all lower extremity muscular groups bilateral without pain on range of motion. No tenderness with calf compression bilateral.  Assessment and Plan: Problem List Items Addressed This Visit      Musculoskeletal and Integument   Callus of foot     Other   LEG PAIN, LEFT    Other Visit Diagnoses    Pain due to onychomycosis of toenails of both feet    -  Primary   Diabetes mellitus without complication (HCC)       Hammer toes of both feet       Diabetes mellitus with no complication ([place])          -Examined patient. -Re-discussed and educated patient on diabetic foot care, especially with  regards to the vascular, neurological and musculoskeletal systems.  -Mechanically debrided all nails 1-5 bilateral using sterile nail nipper and filed with dremel without incident  -Mechanically debrided callus at tips of third toes bilateral using a sterile 15 blade without incident at no additional charge -Advised patient to try topical OTC pain cream to right foot at bedtime and to take Tylenol for 2 weeks if pain  continues to follow up sooner -Applied offloading padding to shoe insole on the lateral side and advised patient if this works well may benefit from new shoes; awaiting re-ordered diabetic shoes -Patient to return  in 3 months for at risk foot care  -Patient advised to call the office if any problems or questions arise in the meantime.  Landis Martins, DPM

## 2020-12-14 ENCOUNTER — Encounter: Payer: Self-pay | Admitting: Podiatry

## 2020-12-14 ENCOUNTER — Ambulatory Visit: Payer: Medicare PPO | Admitting: Podiatry

## 2020-12-14 ENCOUNTER — Other Ambulatory Visit: Payer: Self-pay

## 2020-12-14 ENCOUNTER — Ambulatory Visit (INDEPENDENT_AMBULATORY_CARE_PROVIDER_SITE_OTHER): Payer: Medicare PPO

## 2020-12-14 DIAGNOSIS — L84 Corns and callosities: Secondary | ICD-10-CM | POA: Diagnosis not present

## 2020-12-14 DIAGNOSIS — M2141 Flat foot [pes planus] (acquired), right foot: Secondary | ICD-10-CM

## 2020-12-14 DIAGNOSIS — M2142 Flat foot [pes planus] (acquired), left foot: Secondary | ICD-10-CM

## 2020-12-14 DIAGNOSIS — M722 Plantar fascial fibromatosis: Secondary | ICD-10-CM

## 2020-12-14 NOTE — Progress Notes (Signed)
Subjective:   Patient ID: Breanna [name], female   DOB: 86 y.o.   MRN: [id number]   HPI Patient presents with pain in the outside of her right foot inflammation of the tendon and also has lesion formation that is become thick and hard for her to cut. States that this is started suddenly and she is having trouble sleeping due to intense discomfort   ROS      Objective:  Physical Exam  Neurovascular status intact with inflammation of the lateral band of the plantar fascial right with fluid buildup and is noted to have also keratotic lesion that is irritated inflamed lateral side foot     Assessment:  Acute fasciitis right along with lesion formation right painful     Plan:  H&P reviewed both conditions sterile prep injected the plantar fascial right 3 mg Kenalog 5 mg Xylocaine and then went ahead and using sterile sharp instrumentation debrided lesion neurogenic bleeding reappoint routine care and for this if it remains symptomatic  X-ray indicates severe arthritis no indications of spurring or other pathology associated with this particular problem with normal spurring occurring

## 2021-01-05 DIAGNOSIS — Z20822 Contact with and (suspected) exposure to covid-19: Secondary | ICD-10-CM | POA: Diagnosis not present

## 2021-01-16 DIAGNOSIS — Z947 Corneal transplant status: Secondary | ICD-10-CM | POA: Diagnosis not present

## 2021-01-16 DIAGNOSIS — Z9841 Cataract extraction status, right eye: Secondary | ICD-10-CM | POA: Diagnosis not present

## 2021-01-16 DIAGNOSIS — Z961 Presence of intraocular lens: Secondary | ICD-10-CM | POA: Diagnosis not present

## 2021-01-16 DIAGNOSIS — H401133 Primary open-angle glaucoma, bilateral, severe stage: Secondary | ICD-10-CM | POA: Diagnosis not present

## 2021-01-18 ENCOUNTER — Other Ambulatory Visit: Payer: Self-pay

## 2021-01-19 ENCOUNTER — Ambulatory Visit (INDEPENDENT_AMBULATORY_CARE_PROVIDER_SITE_OTHER): Payer: Medicare PPO | Admitting: Internal Medicine

## 2021-01-19 ENCOUNTER — Encounter: Payer: Self-pay | Admitting: Internal Medicine

## 2021-01-19 ENCOUNTER — Ambulatory Visit (INDEPENDENT_AMBULATORY_CARE_PROVIDER_SITE_OTHER): Payer: Medicare PPO

## 2021-01-19 VITALS — BP 142/70 | HR 50 | Temp 98.3°F | Ht 64.0 in | Wt 132.8 lb

## 2021-01-19 DIAGNOSIS — E1142 Type 2 diabetes mellitus with diabetic polyneuropathy: Secondary | ICD-10-CM | POA: Diagnosis not present

## 2021-01-19 DIAGNOSIS — R0789 Other chest pain: Secondary | ICD-10-CM

## 2021-01-19 DIAGNOSIS — G8929 Other chronic pain: Secondary | ICD-10-CM | POA: Diagnosis not present

## 2021-01-19 DIAGNOSIS — I10 Essential (primary) hypertension: Secondary | ICD-10-CM

## 2021-01-19 DIAGNOSIS — E1121 Type 2 diabetes mellitus with diabetic nephropathy: Secondary | ICD-10-CM

## 2021-01-19 DIAGNOSIS — R0781 Pleurodynia: Secondary | ICD-10-CM | POA: Diagnosis not present

## 2021-01-19 DIAGNOSIS — R5383 Other fatigue: Secondary | ICD-10-CM

## 2021-01-19 DIAGNOSIS — M25559 Pain in unspecified hip: Secondary | ICD-10-CM

## 2021-01-19 DIAGNOSIS — M5440 Lumbago with sciatica, unspecified side: Secondary | ICD-10-CM | POA: Diagnosis not present

## 2021-01-19 LAB — BASIC METABOLIC PANEL
BUN: 20 mg/dL (ref 6–23)
CO2: 30 mEq/L (ref 19–32)
Calcium: 9.9 mg/dL (ref 8.4–10.5)
Chloride: 108 mEq/L (ref 96–112)
Creatinine, Ser: 0.94 mg/dL (ref 0.40–1.20)
GFR: 54.6 mL/min — ABNORMAL LOW (ref >60.00)
Glucose, Bld: 99 mg/dL (ref 70–99)
Potassium: 4.5 mEq/L (ref 3.5–5.1)
Sodium: 142 mEq/L (ref 135–145)

## 2021-01-19 LAB — HEMOGLOBIN A1C: Hgb A1c MFr Bld: 6.2 % (ref 4.6–6.5)

## 2021-01-19 NOTE — Addendum Note (Signed)
Addended by: Raliegh Ip on: 01/19/2021 11:34 AM   Modules accepted: Orders

## 2021-01-19 NOTE — Assessment & Plan Note (Signed)
Start PT at home 

## 2021-01-19 NOTE — Progress Notes (Signed)
Subjective:  Patient ID: Breanna [name], female    DOB: [birth year]  Age: 87 y.o. MRN: [id number]  CC: Follow-up (3 MONTH F/U)   HPI [facility] [place] presents for weakness, gait disorder after surgery in 8/21  F/u HTN, DM  Outpatient Medications Prior to Visit  Medication Sig Dispense Refill  . acetaminophen (TYLENOL) 325 MG tablet Take 2 tablets (650 mg total) by mouth every 6 (six) hours as needed for moderate pain or fever.    Marland Kitchen allopurinol (ZYLOPRIM) 300 MG tablet Take 0.5 tablets (150 mg total) by mouth daily. 90 tablet 3  . amLODipine (NORVASC) 10 MG tablet Take 1 tablet (10 mg total) by mouth daily.    Marland Kitchen aspirin EC 81 MG EC tablet Take 1 tablet (81 mg total) by mouth 2 (two) times daily. Swallow whole. 30 tablet 11  . CALCIUM PO Take 1 tablet by mouth daily.     . carvedilol (COREG) 25 MG tablet Take 1 tablet (25 mg total) by mouth 2 (two) times daily with a meal. 180 tablet 3  . Cholecalciferol (VITAMIN D3) 2000 UNITS capsule Take 2,000 Units by mouth daily.    . cyanocobalamin 2000 MCG tablet Take 2,000 mcg by mouth daily.    . diclofenac sodium (VOLTAREN) 1 % GEL Apply 2 g topically 2 (two) times daily. 200 g 3  . docusate sodium (COLACE) 100 MG capsule Take 1 capsule (100 mg total) by mouth 2 (two) times daily. 60 capsule 11  . dorzolamide-timolol (COSOPT) 22.3-6.8 MG/ML ophthalmic solution Place 1 drop into both eyes 2 (two) times daily.     . feeding supplement, ENSURE ENLIVE, (ENSURE ENLIVE) LIQD Take 237 mLs by mouth daily at 2 PM. 237 mL 12  . feeding supplement, GLUCERNA SHAKE, (GLUCERNA SHAKE) LIQD Take 237 mLs by mouth 2 (two) times daily between meals.  0  . gabapentin (NEURONTIN) 100 MG capsule Take 1 capsule (100 mg total) by mouth 3 (three) times daily. 270 capsule 3  . latanoprost (XALATAN) 0.005 % ophthalmic solution Place 1 drop into both eyes at bedtime.     Marland Kitchen losartan (COZAAR) 25 MG tablet Take 1 tablet (25 mg total) by mouth in the morning and at bedtime. 180  tablet 3  . metFORMIN (GLUCOPHAGE) 500 MG tablet Take 1 tablet (500 mg total) by mouth daily with breakfast. 90 tablet 3  . NON FORMULARY Place 10 drops under the tongue daily as needed (Pain). CBD oil    . pantoprazole (PROTONIX) 40 MG tablet Take 1 tablet (40 mg total) by mouth daily. 90 tablet 3  . polyethylene glycol (MIRALAX / GLYCOLAX) 17 g packet Take 17 g by mouth daily as needed for mild constipation. 14 each 0  . senna (SENOKOT) 8.6 MG TABS tablet Take 1 tablet (8.6 mg total) by mouth 2 (two) times daily. 120 tablet 0  . sertraline (ZOLOFT) 100 MG tablet Take 1 tablet (100 mg total) by mouth daily. 90 tablet 3  . spironolactone (ALDACTONE) 25 MG tablet Take by mouth.    Marland Kitchen amLODipine (NORVASC) 5 MG tablet  (Patient not taking: Reported on 01/19/2021)     No facility-administered medications prior to visit.    ROS: Review of Systems  Constitutional: Positive for fatigue. Negative for activity change, appetite change, chills and unexpected weight change.  HENT: Negative for congestion, mouth sores and sinus pressure.   Eyes: Negative for visual disturbance.  Respiratory: Negative for cough and chest tightness.   Gastrointestinal: Negative for abdominal  pain and nausea.  Genitourinary: Negative for difficulty urinating, frequency and vaginal pain.  Musculoskeletal: Positive for arthralgias, back pain and gait problem.  Skin: Negative for pallor and rash.  Neurological: Negative for dizziness, tremors, weakness, numbness and headaches.  Psychiatric/Behavioral: Negative for confusion, sleep disturbance and suicidal ideas. The patient is not nervous/anxious.     Objective:  BP (!) 142/70 (BP Location: Left Arm)   Pulse (!) 50   Temp 98.3 F (36.8 C) (Oral)   Ht 5\' 4"  (1.626 m)   Wt 132 lb 12.8 oz (60.2 kg)   SpO2 98%   BMI 22.80 kg/m   BP Readings from Last 3 Encounters:  01/19/21 (!) 142/70  10/20/20 (!) 142/84  08/18/20 (!) 160/82    Wt Readings from Last 3 Encounters:   01/19/21 132 lb 12.8 oz (60.2 kg)  10/20/20 136 lb 12.8 oz (62.1 kg)  08/18/20 138 lb (62.6 kg)    Physical Exam Constitutional:      General: She is not in acute distress.    Appearance: She is well-developed.  HENT:     Head: Normocephalic.     Right Ear: External ear normal.     Left Ear: External ear normal.     Nose: Nose normal.     Mouth/Throat:     Mouth: Oropharynx is clear and moist.  Eyes:     General:        Right eye: No discharge.        Left eye: No discharge.     Conjunctiva/sclera: Conjunctivae normal.     Pupils: Pupils are equal, round, and reactive to light.  Neck:     Thyroid: No thyromegaly.     Vascular: No JVD.     Trachea: No tracheal deviation.  Cardiovascular:     Rate and Rhythm: Normal rate and regular rhythm.     Heart sounds: Normal heart sounds.  Pulmonary:     Effort: No respiratory distress.     Breath sounds: No stridor. No wheezing.  Abdominal:     General: Bowel sounds are normal. There is no distension.     Palpations: Abdomen is soft. There is no mass.     Tenderness: There is no abdominal tenderness. There is no guarding or rebound.  Musculoskeletal:        General: No tenderness or edema.     Cervical back: Normal range of motion and neck supple.  Lymphadenopathy:     Cervical: No cervical adenopathy.  Skin:    Findings: No erythema or rash.  Neurological:     Mental Status: She is oriented to person, place, and time.     Cranial Nerves: No cranial nerve deficit.     Motor: Weakness present. No abnormal muscle tone.     Coordination: Coordination abnormal.     Gait: Gait abnormal.     Deep Tendon Reflexes: Reflexes normal.  Psychiatric:        Mood and Affect: Mood and affect normal.        Behavior: Behavior normal.        Thought Content: Thought content normal.        Judgment: Judgment normal.   using a walker L hip scar   Lab Results  Component Value Date   WBC 6.6 08/18/2020   HGB 10.8 (L) 08/18/2020    HCT 34.4 (L) 08/18/2020   PLT 178 08/18/2020   GLUCOSE 89 10/20/2020   CHOL 154 10/19/2014   TRIG 56.0 10/19/2014  HDL 61.10 10/19/2014   LDLCALC 82 10/19/2014   ALT 8 10/20/2020   AST 18 10/20/2020   NA 141 10/20/2020   K 4.1 10/20/2020   CL 104 10/20/2020   CREATININE 1.07 10/20/2020   BUN 18 10/20/2020   CO2 29 10/20/2020   TSH 1.26 07/06/2020   HGBA1C 6.2 10/20/2020    MM 3D SCREEN BREAST BILATERAL  Result Date: 11/22/2020 CLINICAL DATA:  Screening. EXAM: DIGITAL SCREENING BILATERAL MAMMOGRAM WITH TOMO AND CAD COMPARISON:  Previous exam(s). ACR Breast Density Category b: There are scattered areas of fibroglandular density. FINDINGS: There are no findings suspicious for malignancy. Images were processed with CAD. IMPRESSION: No mammographic evidence of malignancy. A result letter of this screening mammogram will be mailed directly to the patient. RECOMMENDATION: Screening mammogram in one year. (Code:SM-B-01Y) BI-RADS CATEGORY  1: Negative. Electronically Signed   By: Abelardo Diesel M.D.   On: 11/22/2020 12:48    Assessment & Plan:    Walker Kehr, MD

## 2021-01-19 NOTE — Assessment & Plan Note (Signed)
Coreg, Amlodipine, Losartan  Spironolactone  

## 2021-01-19 NOTE — Patient Instructions (Signed)
[facility] Local Telephone: [phone]. ... Local Telephone: [phone]. Marland Kitchen..

## 2021-01-21 DIAGNOSIS — I10 Essential (primary) hypertension: Secondary | ICD-10-CM | POA: Diagnosis not present

## 2021-01-21 DIAGNOSIS — H548 Legal blindness, as defined in USA: Secondary | ICD-10-CM | POA: Diagnosis not present

## 2021-01-21 DIAGNOSIS — M5440 Lumbago with sciatica, unspecified side: Secondary | ICD-10-CM | POA: Diagnosis not present

## 2021-01-21 DIAGNOSIS — H409 Unspecified glaucoma: Secondary | ICD-10-CM | POA: Diagnosis not present

## 2021-01-21 DIAGNOSIS — K5900 Constipation, unspecified: Secondary | ICD-10-CM | POA: Diagnosis not present

## 2021-01-21 DIAGNOSIS — E1142 Type 2 diabetes mellitus with diabetic polyneuropathy: Secondary | ICD-10-CM | POA: Diagnosis not present

## 2021-01-21 DIAGNOSIS — H3530 Unspecified macular degeneration: Secondary | ICD-10-CM | POA: Diagnosis not present

## 2021-01-21 DIAGNOSIS — R5383 Other fatigue: Secondary | ICD-10-CM | POA: Diagnosis not present

## 2021-01-21 DIAGNOSIS — S72001D Fracture of unspecified part of neck of right femur, subsequent encounter for closed fracture with routine healing: Secondary | ICD-10-CM | POA: Diagnosis not present

## 2021-01-23 ENCOUNTER — Ambulatory Visit: Payer: Medicare PPO | Admitting: Orthotics

## 2021-01-23 ENCOUNTER — Other Ambulatory Visit: Payer: Self-pay

## 2021-01-23 ENCOUNTER — Telehealth: Payer: Self-pay | Admitting: Internal Medicine

## 2021-01-23 NOTE — Telephone Encounter (Signed)
   Meta Name: [place] Name: [facility] Phone #: [phone] Service Requested: PT (examples: OT/PT/Skilled Nursing/Social Work/Speech Therapy/Wound Care) Frequency of Visits: 1W1, 2W4, [phone]

## 2021-01-24 NOTE — Telephone Encounter (Signed)
Okay.  Thanks.

## 2021-01-25 DIAGNOSIS — S72001D Fracture of unspecified part of neck of right femur, subsequent encounter for closed fracture with routine healing: Secondary | ICD-10-CM | POA: Diagnosis not present

## 2021-01-25 DIAGNOSIS — M5440 Lumbago with sciatica, unspecified side: Secondary | ICD-10-CM | POA: Diagnosis not present

## 2021-01-25 DIAGNOSIS — E1142 Type 2 diabetes mellitus with diabetic polyneuropathy: Secondary | ICD-10-CM | POA: Diagnosis not present

## 2021-01-25 DIAGNOSIS — R5383 Other fatigue: Secondary | ICD-10-CM | POA: Diagnosis not present

## 2021-01-25 DIAGNOSIS — H548 Legal blindness, as defined in USA: Secondary | ICD-10-CM | POA: Diagnosis not present

## 2021-01-25 DIAGNOSIS — K5900 Constipation, unspecified: Secondary | ICD-10-CM | POA: Diagnosis not present

## 2021-01-25 DIAGNOSIS — H409 Unspecified glaucoma: Secondary | ICD-10-CM | POA: Diagnosis not present

## 2021-01-25 DIAGNOSIS — I10 Essential (primary) hypertension: Secondary | ICD-10-CM | POA: Diagnosis not present

## 2021-01-25 DIAGNOSIS — H3530 Unspecified macular degeneration: Secondary | ICD-10-CM | POA: Diagnosis not present

## 2021-01-26 DIAGNOSIS — E1142 Type 2 diabetes mellitus with diabetic polyneuropathy: Secondary | ICD-10-CM | POA: Diagnosis not present

## 2021-01-26 DIAGNOSIS — S72001D Fracture of unspecified part of neck of right femur, subsequent encounter for closed fracture with routine healing: Secondary | ICD-10-CM | POA: Diagnosis not present

## 2021-01-26 DIAGNOSIS — K5900 Constipation, unspecified: Secondary | ICD-10-CM | POA: Diagnosis not present

## 2021-01-26 DIAGNOSIS — R5383 Other fatigue: Secondary | ICD-10-CM | POA: Diagnosis not present

## 2021-01-26 DIAGNOSIS — M5440 Lumbago with sciatica, unspecified side: Secondary | ICD-10-CM | POA: Diagnosis not present

## 2021-01-26 DIAGNOSIS — H548 Legal blindness, as defined in USA: Secondary | ICD-10-CM | POA: Diagnosis not present

## 2021-01-26 DIAGNOSIS — H3530 Unspecified macular degeneration: Secondary | ICD-10-CM | POA: Diagnosis not present

## 2021-01-26 DIAGNOSIS — I10 Essential (primary) hypertension: Secondary | ICD-10-CM | POA: Diagnosis not present

## 2021-01-26 DIAGNOSIS — H409 Unspecified glaucoma: Secondary | ICD-10-CM | POA: Diagnosis not present

## 2021-01-30 DIAGNOSIS — H548 Legal blindness, as defined in USA: Secondary | ICD-10-CM | POA: Diagnosis not present

## 2021-01-30 DIAGNOSIS — E1142 Type 2 diabetes mellitus with diabetic polyneuropathy: Secondary | ICD-10-CM | POA: Diagnosis not present

## 2021-01-30 DIAGNOSIS — H409 Unspecified glaucoma: Secondary | ICD-10-CM | POA: Diagnosis not present

## 2021-01-30 DIAGNOSIS — M5440 Lumbago with sciatica, unspecified side: Secondary | ICD-10-CM | POA: Diagnosis not present

## 2021-01-30 DIAGNOSIS — S72001D Fracture of unspecified part of neck of right femur, subsequent encounter for closed fracture with routine healing: Secondary | ICD-10-CM | POA: Diagnosis not present

## 2021-01-30 DIAGNOSIS — H3530 Unspecified macular degeneration: Secondary | ICD-10-CM | POA: Diagnosis not present

## 2021-01-30 DIAGNOSIS — I10 Essential (primary) hypertension: Secondary | ICD-10-CM | POA: Diagnosis not present

## 2021-01-30 DIAGNOSIS — K5900 Constipation, unspecified: Secondary | ICD-10-CM | POA: Diagnosis not present

## 2021-01-30 DIAGNOSIS — R5383 Other fatigue: Secondary | ICD-10-CM | POA: Diagnosis not present

## 2021-02-01 DIAGNOSIS — S72001D Fracture of unspecified part of neck of right femur, subsequent encounter for closed fracture with routine healing: Secondary | ICD-10-CM | POA: Diagnosis not present

## 2021-02-01 DIAGNOSIS — K5900 Constipation, unspecified: Secondary | ICD-10-CM | POA: Diagnosis not present

## 2021-02-01 DIAGNOSIS — H3530 Unspecified macular degeneration: Secondary | ICD-10-CM | POA: Diagnosis not present

## 2021-02-01 DIAGNOSIS — H548 Legal blindness, as defined in USA: Secondary | ICD-10-CM | POA: Diagnosis not present

## 2021-02-01 DIAGNOSIS — M5440 Lumbago with sciatica, unspecified side: Secondary | ICD-10-CM | POA: Diagnosis not present

## 2021-02-01 DIAGNOSIS — I10 Essential (primary) hypertension: Secondary | ICD-10-CM | POA: Diagnosis not present

## 2021-02-01 DIAGNOSIS — E1142 Type 2 diabetes mellitus with diabetic polyneuropathy: Secondary | ICD-10-CM | POA: Diagnosis not present

## 2021-02-01 DIAGNOSIS — R5383 Other fatigue: Secondary | ICD-10-CM | POA: Diagnosis not present

## 2021-02-01 DIAGNOSIS — H409 Unspecified glaucoma: Secondary | ICD-10-CM | POA: Diagnosis not present

## 2021-02-06 DIAGNOSIS — K5900 Constipation, unspecified: Secondary | ICD-10-CM | POA: Diagnosis not present

## 2021-02-06 DIAGNOSIS — H548 Legal blindness, as defined in USA: Secondary | ICD-10-CM | POA: Diagnosis not present

## 2021-02-06 DIAGNOSIS — R5383 Other fatigue: Secondary | ICD-10-CM | POA: Diagnosis not present

## 2021-02-06 DIAGNOSIS — Z7984 Long term (current) use of oral hypoglycemic drugs: Secondary | ICD-10-CM

## 2021-02-06 DIAGNOSIS — H3530 Unspecified macular degeneration: Secondary | ICD-10-CM | POA: Diagnosis not present

## 2021-02-06 DIAGNOSIS — I10 Essential (primary) hypertension: Secondary | ICD-10-CM | POA: Diagnosis not present

## 2021-02-06 DIAGNOSIS — H409 Unspecified glaucoma: Secondary | ICD-10-CM | POA: Diagnosis not present

## 2021-02-06 DIAGNOSIS — M5440 Lumbago with sciatica, unspecified side: Secondary | ICD-10-CM | POA: Diagnosis not present

## 2021-02-06 DIAGNOSIS — E1142 Type 2 diabetes mellitus with diabetic polyneuropathy: Secondary | ICD-10-CM | POA: Diagnosis not present

## 2021-02-06 DIAGNOSIS — S72001D Fracture of unspecified part of neck of right femur, subsequent encounter for closed fracture with routine healing: Secondary | ICD-10-CM | POA: Diagnosis not present

## 2021-02-06 DIAGNOSIS — Z9181 History of falling: Secondary | ICD-10-CM

## 2021-02-08 DIAGNOSIS — H409 Unspecified glaucoma: Secondary | ICD-10-CM | POA: Diagnosis not present

## 2021-02-08 DIAGNOSIS — H548 Legal blindness, as defined in USA: Secondary | ICD-10-CM | POA: Diagnosis not present

## 2021-02-08 DIAGNOSIS — E1142 Type 2 diabetes mellitus with diabetic polyneuropathy: Secondary | ICD-10-CM | POA: Diagnosis not present

## 2021-02-08 DIAGNOSIS — K5900 Constipation, unspecified: Secondary | ICD-10-CM | POA: Diagnosis not present

## 2021-02-08 DIAGNOSIS — R5383 Other fatigue: Secondary | ICD-10-CM | POA: Diagnosis not present

## 2021-02-08 DIAGNOSIS — S72001D Fracture of unspecified part of neck of right femur, subsequent encounter for closed fracture with routine healing: Secondary | ICD-10-CM | POA: Diagnosis not present

## 2021-02-08 DIAGNOSIS — H3530 Unspecified macular degeneration: Secondary | ICD-10-CM | POA: Diagnosis not present

## 2021-02-08 DIAGNOSIS — I10 Essential (primary) hypertension: Secondary | ICD-10-CM | POA: Diagnosis not present

## 2021-02-08 DIAGNOSIS — M5440 Lumbago with sciatica, unspecified side: Secondary | ICD-10-CM | POA: Diagnosis not present

## 2021-02-13 DIAGNOSIS — E1142 Type 2 diabetes mellitus with diabetic polyneuropathy: Secondary | ICD-10-CM | POA: Diagnosis not present

## 2021-02-13 DIAGNOSIS — S72001D Fracture of unspecified part of neck of right femur, subsequent encounter for closed fracture with routine healing: Secondary | ICD-10-CM | POA: Diagnosis not present

## 2021-02-13 DIAGNOSIS — H548 Legal blindness, as defined in USA: Secondary | ICD-10-CM | POA: Diagnosis not present

## 2021-02-13 DIAGNOSIS — I10 Essential (primary) hypertension: Secondary | ICD-10-CM | POA: Diagnosis not present

## 2021-02-13 DIAGNOSIS — H409 Unspecified glaucoma: Secondary | ICD-10-CM | POA: Diagnosis not present

## 2021-02-13 DIAGNOSIS — M5440 Lumbago with sciatica, unspecified side: Secondary | ICD-10-CM | POA: Diagnosis not present

## 2021-02-13 DIAGNOSIS — K5900 Constipation, unspecified: Secondary | ICD-10-CM | POA: Diagnosis not present

## 2021-02-13 DIAGNOSIS — R5383 Other fatigue: Secondary | ICD-10-CM | POA: Diagnosis not present

## 2021-02-13 DIAGNOSIS — H3530 Unspecified macular degeneration: Secondary | ICD-10-CM | POA: Diagnosis not present

## 2021-02-16 ENCOUNTER — Other Ambulatory Visit: Payer: Self-pay | Admitting: *Deleted

## 2021-02-16 DIAGNOSIS — R5383 Other fatigue: Secondary | ICD-10-CM | POA: Diagnosis not present

## 2021-02-16 DIAGNOSIS — E1142 Type 2 diabetes mellitus with diabetic polyneuropathy: Secondary | ICD-10-CM | POA: Diagnosis not present

## 2021-02-16 DIAGNOSIS — K5900 Constipation, unspecified: Secondary | ICD-10-CM | POA: Diagnosis not present

## 2021-02-16 DIAGNOSIS — M5440 Lumbago with sciatica, unspecified side: Secondary | ICD-10-CM | POA: Diagnosis not present

## 2021-02-16 DIAGNOSIS — H409 Unspecified glaucoma: Secondary | ICD-10-CM | POA: Diagnosis not present

## 2021-02-16 DIAGNOSIS — S72001D Fracture of unspecified part of neck of right femur, subsequent encounter for closed fracture with routine healing: Secondary | ICD-10-CM | POA: Diagnosis not present

## 2021-02-16 DIAGNOSIS — I10 Essential (primary) hypertension: Secondary | ICD-10-CM | POA: Diagnosis not present

## 2021-02-16 DIAGNOSIS — H548 Legal blindness, as defined in USA: Secondary | ICD-10-CM | POA: Diagnosis not present

## 2021-02-16 DIAGNOSIS — H3530 Unspecified macular degeneration: Secondary | ICD-10-CM | POA: Diagnosis not present

## 2021-02-16 MED ORDER — ASPIRIN 81 MG PO TBEC: 81.0000 mg | DELAYED_RELEASE_TABLET | Freq: Every day | ORAL | 3 refills | Status: DC

## 2021-02-16 MED ORDER — VITAMIN C 500 MG PO CAPS: 1.0000 | ORAL_CAPSULE | Freq: Every day | ORAL | 3 refills | Status: DC

## 2021-02-17 ENCOUNTER — Telehealth: Payer: Self-pay | Admitting: Internal Medicine

## 2021-02-17 NOTE — Telephone Encounter (Signed)
Called [facility] back spoke w/tech she states they have that order already not sure why call was made.Marland KitchenJohny Chess

## 2021-02-17 NOTE — Telephone Encounter (Signed)
losartan (COZAAR) 25 MG tablet [facility] simple dose calling to see if the patient is supposed to be on once a day or twice a day [phone]

## 2021-02-20 ENCOUNTER — Other Ambulatory Visit: Payer: Self-pay | Admitting: Internal Medicine

## 2021-02-20 DIAGNOSIS — H409 Unspecified glaucoma: Secondary | ICD-10-CM | POA: Diagnosis not present

## 2021-02-20 DIAGNOSIS — I10 Essential (primary) hypertension: Secondary | ICD-10-CM | POA: Diagnosis not present

## 2021-02-20 DIAGNOSIS — K5900 Constipation, unspecified: Secondary | ICD-10-CM | POA: Diagnosis not present

## 2021-02-20 DIAGNOSIS — M5440 Lumbago with sciatica, unspecified side: Secondary | ICD-10-CM | POA: Diagnosis not present

## 2021-02-20 DIAGNOSIS — H3530 Unspecified macular degeneration: Secondary | ICD-10-CM | POA: Diagnosis not present

## 2021-02-20 DIAGNOSIS — R5383 Other fatigue: Secondary | ICD-10-CM | POA: Diagnosis not present

## 2021-02-20 DIAGNOSIS — S72001D Fracture of unspecified part of neck of right femur, subsequent encounter for closed fracture with routine healing: Secondary | ICD-10-CM | POA: Diagnosis not present

## 2021-02-20 DIAGNOSIS — E1142 Type 2 diabetes mellitus with diabetic polyneuropathy: Secondary | ICD-10-CM | POA: Diagnosis not present

## 2021-02-20 DIAGNOSIS — H548 Legal blindness, as defined in USA: Secondary | ICD-10-CM | POA: Diagnosis not present

## 2021-02-22 DIAGNOSIS — I10 Essential (primary) hypertension: Secondary | ICD-10-CM | POA: Diagnosis not present

## 2021-02-22 DIAGNOSIS — K5900 Constipation, unspecified: Secondary | ICD-10-CM | POA: Diagnosis not present

## 2021-02-22 DIAGNOSIS — M5440 Lumbago with sciatica, unspecified side: Secondary | ICD-10-CM | POA: Diagnosis not present

## 2021-02-22 DIAGNOSIS — H3530 Unspecified macular degeneration: Secondary | ICD-10-CM | POA: Diagnosis not present

## 2021-02-22 DIAGNOSIS — S72001D Fracture of unspecified part of neck of right femur, subsequent encounter for closed fracture with routine healing: Secondary | ICD-10-CM | POA: Diagnosis not present

## 2021-02-22 DIAGNOSIS — H548 Legal blindness, as defined in USA: Secondary | ICD-10-CM | POA: Diagnosis not present

## 2021-02-22 DIAGNOSIS — H409 Unspecified glaucoma: Secondary | ICD-10-CM | POA: Diagnosis not present

## 2021-02-22 DIAGNOSIS — R5383 Other fatigue: Secondary | ICD-10-CM | POA: Diagnosis not present

## 2021-02-22 DIAGNOSIS — E1142 Type 2 diabetes mellitus with diabetic polyneuropathy: Secondary | ICD-10-CM | POA: Diagnosis not present

## 2021-02-27 DIAGNOSIS — H548 Legal blindness, as defined in USA: Secondary | ICD-10-CM | POA: Diagnosis not present

## 2021-02-27 DIAGNOSIS — H409 Unspecified glaucoma: Secondary | ICD-10-CM | POA: Diagnosis not present

## 2021-02-27 DIAGNOSIS — M5440 Lumbago with sciatica, unspecified side: Secondary | ICD-10-CM | POA: Diagnosis not present

## 2021-02-27 DIAGNOSIS — E1142 Type 2 diabetes mellitus with diabetic polyneuropathy: Secondary | ICD-10-CM | POA: Diagnosis not present

## 2021-02-27 DIAGNOSIS — I10 Essential (primary) hypertension: Secondary | ICD-10-CM | POA: Diagnosis not present

## 2021-02-27 DIAGNOSIS — S72001D Fracture of unspecified part of neck of right femur, subsequent encounter for closed fracture with routine healing: Secondary | ICD-10-CM | POA: Diagnosis not present

## 2021-02-27 DIAGNOSIS — R5383 Other fatigue: Secondary | ICD-10-CM | POA: Diagnosis not present

## 2021-02-27 DIAGNOSIS — K5900 Constipation, unspecified: Secondary | ICD-10-CM | POA: Diagnosis not present

## 2021-02-27 DIAGNOSIS — H3530 Unspecified macular degeneration: Secondary | ICD-10-CM | POA: Diagnosis not present

## 2021-03-06 DIAGNOSIS — S72001D Fracture of unspecified part of neck of right femur, subsequent encounter for closed fracture with routine healing: Secondary | ICD-10-CM | POA: Diagnosis not present

## 2021-03-06 DIAGNOSIS — H409 Unspecified glaucoma: Secondary | ICD-10-CM | POA: Diagnosis not present

## 2021-03-06 DIAGNOSIS — H3530 Unspecified macular degeneration: Secondary | ICD-10-CM | POA: Diagnosis not present

## 2021-03-06 DIAGNOSIS — E1142 Type 2 diabetes mellitus with diabetic polyneuropathy: Secondary | ICD-10-CM | POA: Diagnosis not present

## 2021-03-06 DIAGNOSIS — I10 Essential (primary) hypertension: Secondary | ICD-10-CM | POA: Diagnosis not present

## 2021-03-06 DIAGNOSIS — K5900 Constipation, unspecified: Secondary | ICD-10-CM | POA: Diagnosis not present

## 2021-03-06 DIAGNOSIS — R5383 Other fatigue: Secondary | ICD-10-CM | POA: Diagnosis not present

## 2021-03-06 DIAGNOSIS — M5440 Lumbago with sciatica, unspecified side: Secondary | ICD-10-CM | POA: Diagnosis not present

## 2021-03-06 DIAGNOSIS — H548 Legal blindness, as defined in USA: Secondary | ICD-10-CM | POA: Diagnosis not present

## 2021-03-07 ENCOUNTER — Other Ambulatory Visit: Payer: Self-pay | Admitting: *Deleted

## 2021-03-07 MED ORDER — CALCIUM 600 MG PO TABS
600.0000 mg | ORAL_TABLET | Freq: Every day | ORAL | 3 refills | Status: DC
Start: 2021-03-07 — End: 2023-03-03

## 2021-03-09 ENCOUNTER — Other Ambulatory Visit: Payer: Self-pay

## 2021-03-09 ENCOUNTER — Encounter: Payer: Self-pay | Admitting: Sports Medicine

## 2021-03-09 ENCOUNTER — Ambulatory Visit: Payer: Medicare PPO | Admitting: Sports Medicine

## 2021-03-09 DIAGNOSIS — M2141 Flat foot [pes planus] (acquired), right foot: Secondary | ICD-10-CM

## 2021-03-09 DIAGNOSIS — M79675 Pain in left toe(s): Secondary | ICD-10-CM | POA: Diagnosis not present

## 2021-03-09 DIAGNOSIS — M79674 Pain in right toe(s): Secondary | ICD-10-CM

## 2021-03-09 DIAGNOSIS — E119 Type 2 diabetes mellitus without complications: Secondary | ICD-10-CM | POA: Diagnosis not present

## 2021-03-09 DIAGNOSIS — B351 Tinea unguium: Secondary | ICD-10-CM

## 2021-03-09 DIAGNOSIS — L84 Corns and callosities: Secondary | ICD-10-CM

## 2021-03-09 DIAGNOSIS — M2142 Flat foot [pes planus] (acquired), left foot: Secondary | ICD-10-CM

## 2021-03-09 NOTE — Progress Notes (Signed)
Subjective: Breanna [name] is a 87 y.o. female patient with history of diabetes who returns to office today complaining of long,mildly painful nails and callus to toes while ambulating in shoes; unable to trim.  Patient reports that she has some right foot pain at the back of heel with swelling that is sore. No other pedal complaints.  Patient Active Problem List   Diagnosis Date Noted  . Anemia 08/18/2020  . Constipation 08/18/2020  . Hip fracture requiring operative repair ([place]) 07/21/2020  . Sinus bradycardia 07/21/2020  . Hip pain 07/06/2020  . Fall against object 12/25/2018  . Head contusion 12/25/2018  . Acute pain of right knee 09/28/2018  . Well adult exam 05/13/2018  . Herpes labialis 05/13/2018  . Night sweats 04/04/2018  . Breast symptom 10/30/2017  . Diarrhea 08/21/2017  . UTI (urinary tract infection) 08/21/2017  . Lung nodule 08/21/2017  . FTT (failure to thrive) in adult 08/21/2017  . Shortness of breath 08/13/2017  . Cough 08/13/2017  . Fatigue 08/13/2017  . Fever 08/13/2017  . Blurry vision 08/13/2017  . Shoulder pain, right 07/25/2017  . Callus of foot 06/07/2017  . Need for prophylactic vaccination and inoculation against influenza 11/21/2016  . Trigger finger, acquired 11/12/2016  . Meteorism 07/11/2016  . Dark stools 01/10/2016  . Osteoporosis 07/13/2015  . Fall at home 01/13/2015  . Cornea disorder 11/22/2014  . Primary open angle glaucoma of both eyes, indeterminate stage 11/22/2014  . Secondary corneal edema 10/14/2014  . Swelling of left knee joint 04/07/2014  . Grief 01/04/2014  . Syncope 01/04/2014  . Thoracic spine pain 10/22/2013  . Hypernatremia 07/07/2013  . Viral intestinal infection 03/25/2013  . Dehydration, moderate 03/24/2013  . Food poisoning 03/24/2013  . Headache(784.0) 12/22/2012  . URI (upper respiratory infection) 10/21/2012  . Diabetic neuropathy ([place]) 07/30/2012  . Neck pain on right side 07/30/2012  . Cerumen impaction  07/17/2012  . Cystoid macular edema 06/19/2012  . History of corneal transplant 06/19/2012  . Weight loss 04/10/2012  . Wrist pain, right 04/06/2011  . PARESTHESIA 01/03/2011  . FULL INCONTINENCE OF FECES 01/01/2011  . ABDOMINAL PAIN RIGHT UPPER QUADRANT 01/01/2011  . LEG PAIN, LEFT 10/02/2010  . Cerebral artery occlusion with cerebral infarction ([place]) 03/27/2010  . GERD 03/27/2010  . DIVERTICULOSIS-COLON 03/27/2010  . CHOLELITHIASIS 03/27/2010  . LOW BACK PAIN 09/07/2009  . ANEMIA, IRON DEFICIENCY, HX OF 06/14/2009  . DYSPHAGIA 05/16/2009  . Dysphagia 05/16/2009  . Dyslipidemia 11/06/2007  . Hyperlipidemia 11/06/2007  . Type 2 diabetes mellitus with renal manifestations, controlled ([place]) 07/29/2007  . Gout 07/29/2007  . Essential hypertension 07/29/2007  . HYDRONEPHROSIS 07/29/2007  . Osteoarthritis 07/29/2007  . COLONIC POLYPS, HX OF 07/29/2007  . DIVERTICULITIS, HX OF 07/29/2007   Current Outpatient Medications on File Prior to Visit  Medication Sig Dispense Refill  . acetaminophen (TYLENOL) 325 MG tablet Take 2 tablets (650 mg total) by mouth every 6 (six) hours as needed for moderate pain or fever.    Marland Kitchen allopurinol (ZYLOPRIM) 300 MG tablet TAKE 1 TABLET BY MOUTH EVERY DAY 30 tablet 11  . amLODipine (NORVASC) 10 MG tablet Take 1 tablet (10 mg total) by mouth daily.    Marland Kitchen amLODipine (NORVASC) 5 MG tablet TAKE 1 TABLET BY MOUTH EVERY DAY 30 tablet 11  . Ascorbic Acid (VITAMIN C) 500 MG CAPS Take 1 capsule by mouth daily. 90 capsule 3  . aspirin 81 MG EC tablet Take 1 tablet (81 mg total) by mouth daily.  Swallow whole. 90 tablet 3  . calcium carbonate (OS-CAL) 600 MG tablet Take 1 tablet (600 mg total) by mouth daily. 90 tablet 3  . carvedilol (COREG) 25 MG tablet Take 1 tablet (25 mg total) by mouth 2 (two) times daily with a meal. 180 tablet 3  . Cholecalciferol (VITAMIN D3) 2000 UNITS capsule Take 2,000 Units by mouth daily.    . cyanocobalamin 2000 MCG tablet Take 2,000 mcg  by mouth daily.    . diclofenac sodium (VOLTAREN) 1 % GEL Apply 2 g topically 2 (two) times daily. 200 g 3  . docusate sodium (COLACE) 100 MG capsule Take 1 capsule (100 mg total) by mouth 2 (two) times daily. 60 capsule 11  . dorzolamide-timolol (COSOPT) 22.3-6.8 MG/ML ophthalmic solution Place 1 drop into both eyes 2 (two) times daily.     . feeding supplement, ENSURE ENLIVE, (ENSURE ENLIVE) LIQD Take 237 mLs by mouth daily at 2 PM. 237 mL 12  . feeding supplement, GLUCERNA SHAKE, (GLUCERNA SHAKE) LIQD Take 237 mLs by mouth 2 (two) times daily between meals.  0  . gabapentin (NEURONTIN) 100 MG capsule Take 1 capsule (100 mg total) by mouth 3 (three) times daily. 270 capsule 3  . latanoprost (XALATAN) 0.005 % ophthalmic solution Place 1 drop into both eyes at bedtime.     Marland Kitchen losartan (COZAAR) 25 MG tablet Take 1 tablet (25 mg total) by mouth in the morning and at bedtime. 180 tablet 3  . metFORMIN (GLUCOPHAGE) 500 MG tablet TAKE 1 TABLET BY MOUTH EVERY DAY WITH BREAKFAST 30 tablet 11  . NON FORMULARY Place 10 drops under the tongue daily as needed (Pain). CBD oil    . pantoprazole (PROTONIX) 40 MG tablet TAKE 1 TABLET BY MOUTH EVERY DAY 30 tablet 11  . polyethylene glycol (MIRALAX / GLYCOLAX) 17 g packet Take 17 g by mouth daily as needed for mild constipation. 14 each 0  . senna (SENOKOT) 8.6 MG TABS tablet Take 1 tablet (8.6 mg total) by mouth 2 (two) times daily. 120 tablet 0  . sertraline (ZOLOFT) 100 MG tablet TAKE 1 TABLET BY MOUTH EVERY DAY 30 tablet 11  . spironolactone (ALDACTONE) 25 MG tablet Take by mouth.     No current facility-administered medications on file prior to visit.   Allergies  Allergen Reactions  . Verapamil Shortness Of Breath    REACTION: SOB  . Aspirin Itching    But tolerates low dose  . Atenolol     REACTION: fatigue  . Codeine Itching  . Codeine Sulfate   . Hydrochlorothiazide     REACTION: gout  . Hydrocodone       side effects - hallucinations  .  Ibuprofen     Upset stomach w/high doses  . Lisinopril     REACTION: cough  . Onion Other (See Comments)    Dry mouth/ gets sores  . Shellfish Allergy Swelling    Patient stated she does not eat shellfish, she swells up on different parts of the body  . Valsartan Itching  . Adhesive  [Tape] Rash      Objective: General: Patient is awake, alert, and oriented x 3 and in no acute distress.  Integument: Skin is warm, dry and supple bilateral. Nails are tender, long, thickened and dystrophic with subungual debris, consistent with onychomycosis, 1-5 on left and 1,3-5 on right. No signs of infection. Minimal callus at distal tuft at 3rd toes bilateral. Remaining integument unremarkable.  Neurovascular status unchanged since last  visit. Trace edema at ankles R>L.  Musculoskeletal: No reproducible pain to right lateral foot at 5th met base but there is some pain at posterior heel on right. Asymptomatic hammertoe and pes planus pedal deformities noted bilateral. Muscular strength 5/5 in all lower extremity muscular groups bilateral without pain on range of motion. Achilles intact on right.. No tenderness with calf compression bilateral.  Assessment and Plan: Problem List Items Addressed This Visit      Musculoskeletal and Integument   Callus of foot    Other Visit Diagnoses    Pain due to onychomycosis of toenails of both feet    -  Primary   Pes planus of both feet       Diabetes mellitus without complication ([place])          -Examined patient. -Re-discussed and educated patient on diabetic foot care, especially with  regards to the vascular, neurological and musculoskeletal systems.  -Mechanically debrided all nails 1-5 bilateral using sterile nail nipper and filed with dremel without incident  -Mechanically debrided callus at tips of third toes bilateral using a sterile 15 blade without incident at no additional charge -Continue with good supportive shoes for foot type and dispensed  surgigripe sleeve to wear on right and advised patient to continue with topical OTC pain creams PRN -Patient to return  in 3 months for at risk foot care  -Patient advised to call the office if any problems or questions arise in the meantime.  Landis Martins, DPM

## 2021-03-17 DIAGNOSIS — K5900 Constipation, unspecified: Secondary | ICD-10-CM | POA: Diagnosis not present

## 2021-03-17 DIAGNOSIS — E1142 Type 2 diabetes mellitus with diabetic polyneuropathy: Secondary | ICD-10-CM | POA: Diagnosis not present

## 2021-03-17 DIAGNOSIS — H548 Legal blindness, as defined in USA: Secondary | ICD-10-CM | POA: Diagnosis not present

## 2021-03-17 DIAGNOSIS — H3530 Unspecified macular degeneration: Secondary | ICD-10-CM | POA: Diagnosis not present

## 2021-03-17 DIAGNOSIS — R5383 Other fatigue: Secondary | ICD-10-CM | POA: Diagnosis not present

## 2021-03-17 DIAGNOSIS — M5440 Lumbago with sciatica, unspecified side: Secondary | ICD-10-CM | POA: Diagnosis not present

## 2021-03-17 DIAGNOSIS — I10 Essential (primary) hypertension: Secondary | ICD-10-CM | POA: Diagnosis not present

## 2021-03-17 DIAGNOSIS — H409 Unspecified glaucoma: Secondary | ICD-10-CM | POA: Diagnosis not present

## 2021-03-17 DIAGNOSIS — S72001D Fracture of unspecified part of neck of right femur, subsequent encounter for closed fracture with routine healing: Secondary | ICD-10-CM | POA: Diagnosis not present

## 2021-03-20 ENCOUNTER — Telehealth: Payer: Self-pay | Admitting: Internal Medicine

## 2021-03-20 NOTE — Telephone Encounter (Signed)
Okay. Thank you.

## 2021-03-20 NOTE — Telephone Encounter (Signed)
Gracie a physical therapist from [facility] calling. Requesting verbal orders for approval for home health as well as re-certification of PT for 1 time a week for 9 weeks. Terri [phone] Okay to leave VM

## 2021-03-21 NOTE — Telephone Encounter (Signed)
Called Breanna [name] there was no answer LMOM w/MD response.Marland KitchenJohny [name]

## 2021-03-22 DIAGNOSIS — S72001D Fracture of unspecified part of neck of right femur, subsequent encounter for closed fracture with routine healing: Secondary | ICD-10-CM | POA: Diagnosis not present

## 2021-03-22 DIAGNOSIS — M5440 Lumbago with sciatica, unspecified side: Secondary | ICD-10-CM | POA: Diagnosis not present

## 2021-03-22 DIAGNOSIS — H409 Unspecified glaucoma: Secondary | ICD-10-CM | POA: Diagnosis not present

## 2021-03-22 DIAGNOSIS — E1142 Type 2 diabetes mellitus with diabetic polyneuropathy: Secondary | ICD-10-CM | POA: Diagnosis not present

## 2021-03-22 DIAGNOSIS — R5383 Other fatigue: Secondary | ICD-10-CM | POA: Diagnosis not present

## 2021-03-22 DIAGNOSIS — H3530 Unspecified macular degeneration: Secondary | ICD-10-CM | POA: Diagnosis not present

## 2021-03-22 DIAGNOSIS — H548 Legal blindness, as defined in USA: Secondary | ICD-10-CM | POA: Diagnosis not present

## 2021-03-22 DIAGNOSIS — I10 Essential (primary) hypertension: Secondary | ICD-10-CM | POA: Diagnosis not present

## 2021-03-22 DIAGNOSIS — K5900 Constipation, unspecified: Secondary | ICD-10-CM | POA: Diagnosis not present

## 2021-03-29 DIAGNOSIS — S72001D Fracture of unspecified part of neck of right femur, subsequent encounter for closed fracture with routine healing: Secondary | ICD-10-CM | POA: Diagnosis not present

## 2021-03-29 DIAGNOSIS — H409 Unspecified glaucoma: Secondary | ICD-10-CM | POA: Diagnosis not present

## 2021-03-29 DIAGNOSIS — K5900 Constipation, unspecified: Secondary | ICD-10-CM | POA: Diagnosis not present

## 2021-03-29 DIAGNOSIS — E1142 Type 2 diabetes mellitus with diabetic polyneuropathy: Secondary | ICD-10-CM | POA: Diagnosis not present

## 2021-03-29 DIAGNOSIS — I10 Essential (primary) hypertension: Secondary | ICD-10-CM | POA: Diagnosis not present

## 2021-03-29 DIAGNOSIS — M5440 Lumbago with sciatica, unspecified side: Secondary | ICD-10-CM | POA: Diagnosis not present

## 2021-03-29 DIAGNOSIS — H3530 Unspecified macular degeneration: Secondary | ICD-10-CM | POA: Diagnosis not present

## 2021-03-29 DIAGNOSIS — R5383 Other fatigue: Secondary | ICD-10-CM | POA: Diagnosis not present

## 2021-03-29 DIAGNOSIS — H548 Legal blindness, as defined in USA: Secondary | ICD-10-CM | POA: Diagnosis not present

## 2021-04-03 DIAGNOSIS — H3530 Unspecified macular degeneration: Secondary | ICD-10-CM | POA: Diagnosis not present

## 2021-04-03 DIAGNOSIS — I10 Essential (primary) hypertension: Secondary | ICD-10-CM | POA: Diagnosis not present

## 2021-04-03 DIAGNOSIS — E1142 Type 2 diabetes mellitus with diabetic polyneuropathy: Secondary | ICD-10-CM | POA: Diagnosis not present

## 2021-04-03 DIAGNOSIS — H548 Legal blindness, as defined in USA: Secondary | ICD-10-CM | POA: Diagnosis not present

## 2021-04-03 DIAGNOSIS — H409 Unspecified glaucoma: Secondary | ICD-10-CM | POA: Diagnosis not present

## 2021-04-03 DIAGNOSIS — S72001D Fracture of unspecified part of neck of right femur, subsequent encounter for closed fracture with routine healing: Secondary | ICD-10-CM | POA: Diagnosis not present

## 2021-04-03 DIAGNOSIS — K5900 Constipation, unspecified: Secondary | ICD-10-CM | POA: Diagnosis not present

## 2021-04-03 DIAGNOSIS — M5440 Lumbago with sciatica, unspecified side: Secondary | ICD-10-CM | POA: Diagnosis not present

## 2021-04-03 DIAGNOSIS — R5383 Other fatigue: Secondary | ICD-10-CM | POA: Diagnosis not present

## 2021-04-10 DIAGNOSIS — K5900 Constipation, unspecified: Secondary | ICD-10-CM | POA: Diagnosis not present

## 2021-04-10 DIAGNOSIS — M5440 Lumbago with sciatica, unspecified side: Secondary | ICD-10-CM | POA: Diagnosis not present

## 2021-04-10 DIAGNOSIS — H548 Legal blindness, as defined in USA: Secondary | ICD-10-CM | POA: Diagnosis not present

## 2021-04-10 DIAGNOSIS — H409 Unspecified glaucoma: Secondary | ICD-10-CM | POA: Diagnosis not present

## 2021-04-10 DIAGNOSIS — I10 Essential (primary) hypertension: Secondary | ICD-10-CM | POA: Diagnosis not present

## 2021-04-10 DIAGNOSIS — E1142 Type 2 diabetes mellitus with diabetic polyneuropathy: Secondary | ICD-10-CM | POA: Diagnosis not present

## 2021-04-10 DIAGNOSIS — R5383 Other fatigue: Secondary | ICD-10-CM | POA: Diagnosis not present

## 2021-04-10 DIAGNOSIS — S72001D Fracture of unspecified part of neck of right femur, subsequent encounter for closed fracture with routine healing: Secondary | ICD-10-CM | POA: Diagnosis not present

## 2021-04-10 DIAGNOSIS — H3530 Unspecified macular degeneration: Secondary | ICD-10-CM | POA: Diagnosis not present

## 2021-04-14 DIAGNOSIS — H2011 Chronic iridocyclitis, right eye: Secondary | ICD-10-CM | POA: Diagnosis not present

## 2021-04-17 DIAGNOSIS — E1142 Type 2 diabetes mellitus with diabetic polyneuropathy: Secondary | ICD-10-CM | POA: Diagnosis not present

## 2021-04-17 DIAGNOSIS — R5383 Other fatigue: Secondary | ICD-10-CM | POA: Diagnosis not present

## 2021-04-17 DIAGNOSIS — I10 Essential (primary) hypertension: Secondary | ICD-10-CM | POA: Diagnosis not present

## 2021-04-17 DIAGNOSIS — M5440 Lumbago with sciatica, unspecified side: Secondary | ICD-10-CM | POA: Diagnosis not present

## 2021-04-17 DIAGNOSIS — H548 Legal blindness, as defined in USA: Secondary | ICD-10-CM | POA: Diagnosis not present

## 2021-04-17 DIAGNOSIS — H409 Unspecified glaucoma: Secondary | ICD-10-CM | POA: Diagnosis not present

## 2021-04-17 DIAGNOSIS — H3530 Unspecified macular degeneration: Secondary | ICD-10-CM | POA: Diagnosis not present

## 2021-04-17 DIAGNOSIS — K5900 Constipation, unspecified: Secondary | ICD-10-CM | POA: Diagnosis not present

## 2021-04-17 DIAGNOSIS — S72001D Fracture of unspecified part of neck of right femur, subsequent encounter for closed fracture with routine healing: Secondary | ICD-10-CM | POA: Diagnosis not present

## 2021-04-19 ENCOUNTER — Other Ambulatory Visit: Payer: Self-pay | Admitting: Internal Medicine

## 2021-04-19 MED ORDER — GABAPENTIN 100 MG PO CAPS
ORAL_CAPSULE | ORAL | 5 refills | Status: DC
Start: 2021-04-19 — End: 2021-05-19

## 2021-04-19 NOTE — Addendum Note (Signed)
Addended by: Earnstine Regal on: 04/19/2021 02:13 PM   Modules accepted: Orders

## 2021-04-19 NOTE — Addendum Note (Signed)
Addended by: Earnstine Regal on: 04/19/2021 02:00 PM   Modules accepted: Orders

## 2021-04-20 ENCOUNTER — Telehealth: Payer: Self-pay | Admitting: *Deleted

## 2021-04-20 MED ORDER — TRUE METRIX LEVEL 1 LOW VI SOLN: 3 refills | Status: DC

## 2021-04-21 DIAGNOSIS — H548 Legal blindness, as defined in USA: Secondary | ICD-10-CM | POA: Diagnosis not present

## 2021-04-21 DIAGNOSIS — H3530 Unspecified macular degeneration: Secondary | ICD-10-CM | POA: Diagnosis not present

## 2021-04-21 DIAGNOSIS — H409 Unspecified glaucoma: Secondary | ICD-10-CM | POA: Diagnosis not present

## 2021-04-21 DIAGNOSIS — I10 Essential (primary) hypertension: Secondary | ICD-10-CM | POA: Diagnosis not present

## 2021-04-21 DIAGNOSIS — M5440 Lumbago with sciatica, unspecified side: Secondary | ICD-10-CM | POA: Diagnosis not present

## 2021-04-21 DIAGNOSIS — S72001D Fracture of unspecified part of neck of right femur, subsequent encounter for closed fracture with routine healing: Secondary | ICD-10-CM | POA: Diagnosis not present

## 2021-04-21 DIAGNOSIS — K5900 Constipation, unspecified: Secondary | ICD-10-CM | POA: Diagnosis not present

## 2021-04-21 DIAGNOSIS — R5383 Other fatigue: Secondary | ICD-10-CM | POA: Diagnosis not present

## 2021-04-21 DIAGNOSIS — E1142 Type 2 diabetes mellitus with diabetic polyneuropathy: Secondary | ICD-10-CM | POA: Diagnosis not present

## 2021-04-26 DIAGNOSIS — H548 Legal blindness, as defined in USA: Secondary | ICD-10-CM | POA: Diagnosis not present

## 2021-04-26 DIAGNOSIS — H3530 Unspecified macular degeneration: Secondary | ICD-10-CM | POA: Diagnosis not present

## 2021-04-26 DIAGNOSIS — I10 Essential (primary) hypertension: Secondary | ICD-10-CM | POA: Diagnosis not present

## 2021-04-26 DIAGNOSIS — M5440 Lumbago with sciatica, unspecified side: Secondary | ICD-10-CM | POA: Diagnosis not present

## 2021-04-26 DIAGNOSIS — H409 Unspecified glaucoma: Secondary | ICD-10-CM | POA: Diagnosis not present

## 2021-04-26 DIAGNOSIS — S72001D Fracture of unspecified part of neck of right femur, subsequent encounter for closed fracture with routine healing: Secondary | ICD-10-CM | POA: Diagnosis not present

## 2021-04-26 DIAGNOSIS — R5383 Other fatigue: Secondary | ICD-10-CM | POA: Diagnosis not present

## 2021-04-26 DIAGNOSIS — K5900 Constipation, unspecified: Secondary | ICD-10-CM | POA: Diagnosis not present

## 2021-04-26 DIAGNOSIS — E1142 Type 2 diabetes mellitus with diabetic polyneuropathy: Secondary | ICD-10-CM | POA: Diagnosis not present

## 2021-04-26 MED ORDER — TRUEPLUS LANCETS 33G MISC: 0 refills | Status: DC

## 2021-04-26 MED ORDER — TRUE COMFORT ALCOHOL PREP PADS 70 % PADS: MEDICATED_PAD | 1 refills | Status: DC

## 2021-04-26 MED ORDER — GNP TRUE METRIX GLUCOSE STRIPS VI STRP: ORAL_STRIP | 2 refills | Status: AC

## 2021-04-26 NOTE — Addendum Note (Signed)
Addended by: Earnstine Regal on: 04/26/2021 04:29 PM   Modules accepted: Orders

## 2021-04-26 NOTE — Telephone Encounter (Signed)
Sent rx's to [facility].Marland KitchenJohny Chess

## 2021-04-26 NOTE — Telephone Encounter (Signed)
[employer] calling, states they still need - humana true metrics test strips -true plus 33 g lancets -bd alcohol swab pads [phone]

## 2021-05-01 DIAGNOSIS — I10 Essential (primary) hypertension: Secondary | ICD-10-CM | POA: Diagnosis not present

## 2021-05-01 DIAGNOSIS — H548 Legal blindness, as defined in USA: Secondary | ICD-10-CM | POA: Diagnosis not present

## 2021-05-01 DIAGNOSIS — K5900 Constipation, unspecified: Secondary | ICD-10-CM | POA: Diagnosis not present

## 2021-05-01 DIAGNOSIS — S72001D Fracture of unspecified part of neck of right femur, subsequent encounter for closed fracture with routine healing: Secondary | ICD-10-CM | POA: Diagnosis not present

## 2021-05-01 DIAGNOSIS — M5440 Lumbago with sciatica, unspecified side: Secondary | ICD-10-CM | POA: Diagnosis not present

## 2021-05-01 DIAGNOSIS — E1142 Type 2 diabetes mellitus with diabetic polyneuropathy: Secondary | ICD-10-CM | POA: Diagnosis not present

## 2021-05-01 DIAGNOSIS — H3530 Unspecified macular degeneration: Secondary | ICD-10-CM | POA: Diagnosis not present

## 2021-05-01 DIAGNOSIS — R5383 Other fatigue: Secondary | ICD-10-CM | POA: Diagnosis not present

## 2021-05-01 DIAGNOSIS — H409 Unspecified glaucoma: Secondary | ICD-10-CM | POA: Diagnosis not present

## 2021-05-04 DIAGNOSIS — Z947 Corneal transplant status: Secondary | ICD-10-CM | POA: Diagnosis not present

## 2021-05-04 DIAGNOSIS — H02883 Meibomian gland dysfunction of right eye, unspecified eyelid: Secondary | ICD-10-CM | POA: Diagnosis not present

## 2021-05-04 DIAGNOSIS — H2011 Chronic iridocyclitis, right eye: Secondary | ICD-10-CM | POA: Diagnosis not present

## 2021-05-04 DIAGNOSIS — H02886 Meibomian gland dysfunction of left eye, unspecified eyelid: Secondary | ICD-10-CM | POA: Diagnosis not present

## 2021-05-04 DIAGNOSIS — Z113 Encounter for screening for infections with a predominantly sexual mode of transmission: Secondary | ICD-10-CM | POA: Diagnosis not present

## 2021-05-04 DIAGNOSIS — Z961 Presence of intraocular lens: Secondary | ICD-10-CM | POA: Diagnosis not present

## 2021-05-08 DIAGNOSIS — S72001D Fracture of unspecified part of neck of right femur, subsequent encounter for closed fracture with routine healing: Secondary | ICD-10-CM | POA: Diagnosis not present

## 2021-05-08 DIAGNOSIS — H548 Legal blindness, as defined in USA: Secondary | ICD-10-CM | POA: Diagnosis not present

## 2021-05-08 DIAGNOSIS — R5383 Other fatigue: Secondary | ICD-10-CM | POA: Diagnosis not present

## 2021-05-08 DIAGNOSIS — M5440 Lumbago with sciatica, unspecified side: Secondary | ICD-10-CM | POA: Diagnosis not present

## 2021-05-08 DIAGNOSIS — I10 Essential (primary) hypertension: Secondary | ICD-10-CM | POA: Diagnosis not present

## 2021-05-08 DIAGNOSIS — K5900 Constipation, unspecified: Secondary | ICD-10-CM | POA: Diagnosis not present

## 2021-05-08 DIAGNOSIS — E1142 Type 2 diabetes mellitus with diabetic polyneuropathy: Secondary | ICD-10-CM | POA: Diagnosis not present

## 2021-05-08 DIAGNOSIS — H3530 Unspecified macular degeneration: Secondary | ICD-10-CM | POA: Diagnosis not present

## 2021-05-08 DIAGNOSIS — H409 Unspecified glaucoma: Secondary | ICD-10-CM | POA: Diagnosis not present

## 2021-05-17 DIAGNOSIS — H401133 Primary open-angle glaucoma, bilateral, severe stage: Secondary | ICD-10-CM | POA: Diagnosis not present

## 2021-05-17 DIAGNOSIS — Z947 Corneal transplant status: Secondary | ICD-10-CM | POA: Diagnosis not present

## 2021-05-18 DIAGNOSIS — H409 Unspecified glaucoma: Secondary | ICD-10-CM | POA: Diagnosis not present

## 2021-05-18 DIAGNOSIS — R5383 Other fatigue: Secondary | ICD-10-CM | POA: Diagnosis not present

## 2021-05-18 DIAGNOSIS — E1142 Type 2 diabetes mellitus with diabetic polyneuropathy: Secondary | ICD-10-CM | POA: Diagnosis not present

## 2021-05-18 DIAGNOSIS — H3530 Unspecified macular degeneration: Secondary | ICD-10-CM | POA: Diagnosis not present

## 2021-05-18 DIAGNOSIS — S72001D Fracture of unspecified part of neck of right femur, subsequent encounter for closed fracture with routine healing: Secondary | ICD-10-CM | POA: Diagnosis not present

## 2021-05-18 DIAGNOSIS — M5440 Lumbago with sciatica, unspecified side: Secondary | ICD-10-CM | POA: Diagnosis not present

## 2021-05-18 DIAGNOSIS — I10 Essential (primary) hypertension: Secondary | ICD-10-CM | POA: Diagnosis not present

## 2021-05-18 DIAGNOSIS — K5900 Constipation, unspecified: Secondary | ICD-10-CM | POA: Diagnosis not present

## 2021-05-18 DIAGNOSIS — H548 Legal blindness, as defined in USA: Secondary | ICD-10-CM | POA: Diagnosis not present

## 2021-05-19 ENCOUNTER — Other Ambulatory Visit: Payer: Self-pay | Admitting: Internal Medicine

## 2021-05-23 ENCOUNTER — Ambulatory Visit: Payer: Medicare PPO | Admitting: Internal Medicine

## 2021-05-23 ENCOUNTER — Encounter: Payer: Self-pay | Admitting: Internal Medicine

## 2021-05-23 ENCOUNTER — Other Ambulatory Visit: Payer: Self-pay

## 2021-05-23 DIAGNOSIS — I700 Atherosclerosis of aorta: Secondary | ICD-10-CM

## 2021-05-23 DIAGNOSIS — E1142 Type 2 diabetes mellitus with diabetic polyneuropathy: Secondary | ICD-10-CM

## 2021-05-23 DIAGNOSIS — M1000 Idiopathic gout, unspecified site: Secondary | ICD-10-CM | POA: Diagnosis not present

## 2021-05-23 DIAGNOSIS — I10 Essential (primary) hypertension: Secondary | ICD-10-CM | POA: Diagnosis not present

## 2021-05-23 DIAGNOSIS — E7849 Other hyperlipidemia: Secondary | ICD-10-CM | POA: Diagnosis not present

## 2021-05-23 DIAGNOSIS — R634 Abnormal weight loss: Secondary | ICD-10-CM | POA: Diagnosis not present

## 2021-05-23 DIAGNOSIS — R259 Unspecified abnormal involuntary movements: Secondary | ICD-10-CM

## 2021-05-23 DIAGNOSIS — E1121 Type 2 diabetes mellitus with diabetic nephropathy: Secondary | ICD-10-CM | POA: Diagnosis not present

## 2021-05-23 LAB — COMPREHENSIVE METABOLIC PANEL
ALT: 11 U/L (ref 0–35)
AST: 17 U/L (ref 0–37)
Albumin: 3.7 g/dL (ref 3.5–5.2)
Alkaline Phosphatase: 106 U/L (ref 39–117)
BUN: 22 mg/dL (ref 6–23)
CO2: 29 mEq/L (ref 19–32)
Calcium: 9.7 mg/dL (ref 8.4–10.5)
Chloride: 107 mEq/L (ref 96–112)
Creatinine, Ser: 1.08 mg/dL (ref 0.40–1.20)
GFR: 46.11 mL/min — ABNORMAL LOW (ref >60.00)
Glucose, Bld: 92 mg/dL (ref 70–99)
Potassium: 4.2 mEq/L (ref 3.5–5.1)
Sodium: 142 mEq/L (ref 135–145)
Total Bilirubin: 0.4 mg/dL (ref 0.2–1.2)
Total Protein: 7 g/dL (ref 6.0–8.3)

## 2021-05-23 LAB — HEMOGLOBIN A1C: Hgb A1c MFr Bld: 6.3 % (ref 4.6–6.5)

## 2021-05-23 MED ORDER — CARBIDOPA-LEVODOPA 10-100 MG PO TABS: 1.0000 | ORAL_TABLET | Freq: Every morning | ORAL | 11 refills | Status: DC

## 2021-05-23 NOTE — Assessment & Plan Note (Signed)
  On diet  

## 2021-05-23 NOTE — Assessment & Plan Note (Signed)
Wt Readings from Last 3 Encounters:  05/23/21 129 lb (58.5 kg)  01/19/21 132 lb 12.8 oz (60.2 kg)  10/20/20 136 lb 12.8 oz (62.1 kg)  Eating better

## 2021-05-23 NOTE — Patient Instructions (Signed)
Valerian root for anxiety 

## 2021-05-23 NOTE — Addendum Note (Signed)
Addended by: Jacobo Forest on: 05/23/2021 02:09 PM   Modules accepted: Orders

## 2021-05-23 NOTE — Assessment & Plan Note (Signed)
Metformin 

## 2021-05-23 NOTE — Assessment & Plan Note (Addendum)
Worse. ?age related Discussed w/pt and son Neurol ref Pt finished PT

## 2021-05-23 NOTE — Assessment & Plan Note (Signed)
Gabapentin

## 2021-05-23 NOTE — Assessment & Plan Note (Signed)
Better  

## 2021-05-23 NOTE — Progress Notes (Signed)
Subjective:  Patient ID: Breanna [name], female    DOB: November 17, 1931  Age: 87 y.o. MRN: [id number]  CC: Follow-up (4 month f/u)   HPI [employer] presents for HTN, gout, DM f/u C/o stiffness, tremor Sounds scare her  Outpatient Medications Prior to Visit  Medication Sig Dispense Refill  . acetaminophen (TYLENOL) 325 MG tablet Take 2 tablets (650 mg total) by mouth every 6 (six) hours as needed for moderate pain or fever.    . Alcohol Swabs (TRUE COMFORT ALCOHOL PREP PADS) 70 % PADS Use as directed 300 each 1  . allopurinol (ZYLOPRIM) 300 MG tablet TAKE 1 TABLET BY MOUTH EVERY DAY 30 tablet 11  . amLODipine (NORVASC) 10 MG tablet Take 1 tablet (10 mg total) by mouth daily.    Marland Kitchen amLODipine (NORVASC) 5 MG tablet TAKE 1 TABLET BY MOUTH EVERY DAY 30 tablet 11  . Ascorbic Acid (VITAMIN C) 500 MG CAPS Take 1 capsule by mouth daily. 90 capsule 3  . aspirin 81 MG EC tablet Take 1 tablet (81 mg total) by mouth daily. Swallow whole. 90 tablet 3  . Blood Glucose Calibration (TRUE METRIX LEVEL 1) Low SOLN USE AS DIRECTED 3 each 3  . calcium carbonate (OS-CAL) 600 MG tablet Take 1 tablet (600 mg total) by mouth daily. 90 tablet 3  . carvedilol (COREG) 25 MG tablet Take 1 tablet (25 mg total) by mouth 2 (two) times daily with a meal. 180 tablet 3  . Cholecalciferol (VITAMIN D3) 2000 UNITS capsule Take 2,000 Units by mouth daily.    . cyanocobalamin 2000 MCG tablet Take 2,000 mcg by mouth daily.    . diclofenac sodium (VOLTAREN) 1 % GEL Apply 2 g topically 2 (two) times daily. 200 g 3  . docusate sodium (COLACE) 100 MG capsule Take 1 capsule (100 mg total) by mouth 2 (two) times daily. 60 capsule 11  . dorzolamide-timolol (COSOPT) 22.3-6.8 MG/ML ophthalmic solution Place 1 drop into both eyes 2 (two) times daily.     . feeding supplement, ENSURE ENLIVE, (ENSURE ENLIVE) LIQD Take 237 mLs by mouth daily at 2 PM. 237 mL 12  . feeding supplement, GLUCERNA SHAKE, (GLUCERNA SHAKE) LIQD Take 237 mLs by  mouth 2 (two) times daily between meals.  0  . gabapentin (NEURONTIN) 100 MG capsule TAKE 1 CAPSULE BY MOUTH THREE TIMES A DAY 90 capsule 11  . glucose blood (GNP TRUE METRIX GLUCOSE STRIPS) test strip Use to check blood sugars twice a day 200 each 2  . latanoprost (XALATAN) 0.005 % ophthalmic solution Place 1 drop into both eyes at bedtime.     Marland Kitchen losartan (COZAAR) 25 MG tablet Take 1 tablet (25 mg total) by mouth in the morning and at bedtime. 180 tablet 3  . metFORMIN (GLUCOPHAGE) 500 MG tablet TAKE 1 TABLET BY MOUTH EVERY DAY WITH BREAKFAST 30 tablet 11  . NON FORMULARY Place 10 drops under the tongue daily as needed (Pain). CBD oil    . pantoprazole (PROTONIX) 40 MG tablet TAKE 1 TABLET BY MOUTH EVERY DAY 30 tablet 11  . polyethylene glycol (MIRALAX / GLYCOLAX) 17 g packet Take 17 g by mouth daily as needed for mild constipation. 14 each 0  . senna (SENOKOT) 8.6 MG TABS tablet Take 1 tablet (8.6 mg total) by mouth 2 (two) times daily. 120 tablet 0  . sertraline (ZOLOFT) 100 MG tablet TAKE 1 TABLET BY MOUTH EVERY DAY 30 tablet 11  . spironolactone (ALDACTONE) 25 MG tablet  Take by mouth.    . TRUEplus Lancets 33G MISC Use as directed to check blood sugars twice a day 200 each 0   No facility-administered medications prior to visit.    ROS: Review of Systems  Constitutional: Positive for unexpected weight change. Negative for activity change, appetite change, chills and fatigue.  HENT: Negative for congestion, mouth sores and sinus pressure.   Eyes: Positive for visual disturbance.  Respiratory: Negative for cough and chest tightness.   Gastrointestinal: Negative for abdominal pain and nausea.  Genitourinary: Negative for difficulty urinating, frequency and vaginal pain.  Musculoskeletal: Positive for arthralgias and gait problem. Negative for back pain.  Skin: Negative for pallor and rash.  Neurological: Negative for dizziness, tremors, weakness, numbness and headaches.   Psychiatric/Behavioral: Negative for confusion and sleep disturbance. The patient is nervous/anxious.     Objective:  BP (!) 172/86 (BP Location: Left Arm)   Pulse 62   Temp 98.5 F (36.9 C) (Oral)   Ht 5\' 4"  (1.626 m)   Wt 129 lb (58.5 kg)   SpO2 98%   BMI 22.14 kg/m   BP Readings from Last 3 Encounters:  05/23/21 (!) 172/86  01/19/21 (!) 142/70  10/20/20 (!) 142/84    Wt Readings from Last 3 Encounters:  05/23/21 129 lb (58.5 kg)  01/19/21 132 lb 12.8 oz (60.2 kg)  10/20/20 136 lb 12.8 oz (62.1 kg)    Physical Exam Constitutional:      General: She is not in acute distress.    Appearance: She is well-developed.  HENT:     Head: Normocephalic.     Right Ear: External ear normal.     Left Ear: External ear normal.     Nose: Nose normal.  Eyes:     General:        Right eye: No discharge.        Left eye: No discharge.     Conjunctiva/sclera: Conjunctivae normal.     Pupils: Pupils are equal, round, and reactive to light.  Neck:     Thyroid: No thyromegaly.     Vascular: No JVD.     Trachea: No tracheal deviation.  Cardiovascular:     Rate and Rhythm: Normal rate and regular rhythm.     Heart sounds: Normal heart sounds.  Pulmonary:     Effort: No respiratory distress.     Breath sounds: No stridor. No wheezing.  Abdominal:     General: Bowel sounds are normal. There is no distension.     Palpations: Abdomen is soft. There is no mass.     Tenderness: There is no abdominal tenderness. There is no guarding or rebound.  Musculoskeletal:        General: Tenderness present.     Cervical back: Normal range of motion and neck supple.  Lymphadenopathy:     Cervical: No cervical adenopathy.  Skin:    Findings: No erythema or rash.  Neurological:     Cranial Nerves: No cranial nerve deficit.     Motor: No abnormal muscle tone.     Coordination: Coordination normal.     Gait: Gait abnormal.     Deep Tendon Reflexes: Reflexes normal.  Psychiatric:         Behavior: Behavior normal.        Thought Content: Thought content normal.        Judgment: Judgment normal.    Using a walker Ataxic, hesitant, stiff Mild tremor  Lab Results  Component Value Date  WBC 6.6 08/18/2020   HGB 10.8 (L) 08/18/2020   HCT 34.4 (L) 08/18/2020   PLT 178 08/18/2020   GLUCOSE 99 01/19/2021   CHOL 154 10/19/2014   TRIG 56.0 10/19/2014   HDL 61.10 10/19/2014   LDLCALC 82 10/19/2014   ALT 8 10/20/2020   AST 18 10/20/2020   NA 142 01/19/2021   K 4.5 01/19/2021   CL 108 01/19/2021   CREATININE 0.94 01/19/2021   BUN 20 01/19/2021   CO2 30 01/19/2021   TSH 1.26 07/06/2020   HGBA1C 6.2 01/19/2021    MM 3D SCREEN BREAST BILATERAL  Result Date: 11/22/2020 CLINICAL DATA:  Screening. EXAM: DIGITAL SCREENING BILATERAL MAMMOGRAM WITH TOMO AND CAD COMPARISON:  Previous exam(s). ACR Breast Density Category b: There are scattered areas of fibroglandular density. FINDINGS: There are no findings suspicious for malignancy. Images were processed with CAD. IMPRESSION: No mammographic evidence of malignancy. A result letter of this screening mammogram will be mailed directly to the patient. RECOMMENDATION: Screening mammogram in one year. (Code:SM-B-01Y) BI-RADS CATEGORY  1: Negative. Electronically Signed   By: Abelardo Diesel M.D.   On: 11/22/2020 12:48    Assessment & Plan:   Walker Kehr, MD

## 2021-05-23 NOTE — Assessment & Plan Note (Signed)
Coreg, Amlodipine, Losartan

## 2021-06-15 ENCOUNTER — Encounter: Payer: Self-pay | Admitting: Sports Medicine

## 2021-06-15 ENCOUNTER — Ambulatory Visit (INDEPENDENT_AMBULATORY_CARE_PROVIDER_SITE_OTHER): Payer: Medicare PPO | Admitting: Sports Medicine

## 2021-06-15 ENCOUNTER — Other Ambulatory Visit: Payer: Self-pay

## 2021-06-15 DIAGNOSIS — E119 Type 2 diabetes mellitus without complications: Secondary | ICD-10-CM | POA: Diagnosis not present

## 2021-06-15 DIAGNOSIS — L84 Corns and callosities: Secondary | ICD-10-CM

## 2021-06-15 DIAGNOSIS — B351 Tinea unguium: Secondary | ICD-10-CM | POA: Diagnosis not present

## 2021-06-15 DIAGNOSIS — M2142 Flat foot [pes planus] (acquired), left foot: Secondary | ICD-10-CM

## 2021-06-15 DIAGNOSIS — M79674 Pain in right toe(s): Secondary | ICD-10-CM

## 2021-06-15 DIAGNOSIS — M2141 Flat foot [pes planus] (acquired), right foot: Secondary | ICD-10-CM

## 2021-06-15 DIAGNOSIS — M79675 Pain in left toe(s): Secondary | ICD-10-CM

## 2021-06-15 MED ORDER — LIDOCAINE-PRILOCAINE 2.5-2.5 % EX CREA: 1.0000 "application " | TOPICAL_CREAM | CUTANEOUS | 0 refills | Status: DC | PRN

## 2021-06-15 NOTE — Progress Notes (Signed)
Subjective: Breanna [name] is a 87 y.o. female patient with history of diabetes who returns to office today complaining of long,mildly painful nails and callus to toes while ambulating in shoes; unable to trim.  Patient reports that she has some right foot pain at the side of the heel that is really sore as episodes where it has woken her up at night. No other pedal complaints.  Patient Active Problem List   Diagnosis Date Noted   Parkinsonian features 05/23/2021   Atherosclerosis of aorta ([place]) 05/23/2021   Anemia 08/18/2020   Constipation 08/18/2020   Hip fracture requiring operative repair ([place]) 07/21/2020   Sinus bradycardia 07/21/2020   Hip pain 07/06/2020   Fall against object 12/25/2018   Head contusion 12/25/2018   Acute pain of right knee 09/28/2018   Well adult exam 05/13/2018   Herpes labialis 05/13/2018   Night sweats 04/04/2018   Breast symptom 10/30/2017   Diarrhea 08/21/2017   UTI (urinary tract infection) 08/21/2017   Lung nodule 08/21/2017   FTT (failure to thrive) in adult 08/21/2017   Shortness of breath 08/13/2017   Cough 08/13/2017   Fatigue 08/13/2017   Fever 08/13/2017   Blurry vision 08/13/2017   Shoulder pain, right 07/25/2017   Callus of foot 06/07/2017   Need for prophylactic vaccination and inoculation against influenza 11/21/2016   Trigger finger, acquired 11/12/2016   Meteorism 07/11/2016   Dark stools 01/10/2016   Osteoporosis 07/13/2015   Fall at home 01/13/2015   Cornea disorder 11/22/2014   Primary open angle glaucoma of both eyes, indeterminate stage 11/22/2014   Secondary corneal edema 10/14/2014   Swelling of left knee joint 04/07/2014   Grief 01/04/2014   Syncope 01/04/2014   Thoracic spine pain 10/22/2013   Hypernatremia 07/07/2013   Viral intestinal infection 03/25/2013   Dehydration, moderate 03/24/2013   Food poisoning 03/24/2013   Headache(784.0) 12/22/2012   URI (upper respiratory infection) 10/21/2012   Diabetic  neuropathy ([place]) 07/30/2012   Neck pain on right side 07/30/2012   Cerumen impaction 07/17/2012   Cystoid macular edema 06/19/2012   History of corneal transplant 06/19/2012   Weight loss 04/10/2012   Wrist pain, right 04/06/2011   PARESTHESIA 01/03/2011   FULL INCONTINENCE OF FECES 01/01/2011   ABDOMINAL PAIN RIGHT UPPER QUADRANT 01/01/2011   LEG PAIN, LEFT 10/02/2010   Cerebral artery occlusion with cerebral infarction ([place]) 03/27/2010   GERD 03/27/2010   DIVERTICULOSIS-COLON 03/27/2010   CHOLELITHIASIS 03/27/2010   LOW BACK PAIN 09/07/2009   ANEMIA, IRON DEFICIENCY, HX OF 06/14/2009   DYSPHAGIA 05/16/2009   Dysphagia 05/16/2009   Dyslipidemia 11/06/2007   Hyperlipidemia 11/06/2007   Type 2 diabetes mellitus with renal manifestations, controlled ([place]) 07/29/2007   Gout 07/29/2007   Essential hypertension 07/29/2007   HYDRONEPHROSIS 07/29/2007   Osteoarthritis 07/29/2007   COLONIC POLYPS, HX OF 07/29/2007   DIVERTICULITIS, HX OF 07/29/2007   Current Outpatient Medications on File Prior to Visit  Medication Sig Dispense Refill   ascorbic acid (VITAMIN C) 500 MG tablet Take by mouth.     atropine 1 % ophthalmic solution atropine 1 % eye drops     latanoprost (XALATAN) 0.005 % ophthalmic solution Place 1 drop into both eyes nightly.     acetaminophen (TYLENOL) 325 MG tablet Take 2 tablets (650 mg total) by mouth every 6 (six) hours as needed for moderate pain or fever.     Alcohol Swabs (TRUE COMFORT ALCOHOL PREP PADS) 70 % PADS Use as directed 300 each 1  allopurinol (ZYLOPRIM) 300 MG tablet TAKE 1 TABLET BY MOUTH EVERY DAY 30 tablet 11   amLODipine (NORVASC) 5 MG tablet TAKE 1 TABLET BY MOUTH EVERY DAY 30 tablet 11   Ascorbic Acid (VITAMIN C) 500 MG CAPS Take 1 capsule by mouth daily. 90 capsule 3   aspirin 81 MG EC tablet Take 1 tablet (81 mg total) by mouth daily. Swallow whole. 90 tablet 3   Blood Glucose Calibration (TRUE METRIX LEVEL 1) Low SOLN USE AS DIRECTED 3 each 3    calcium carbonate (OS-CAL) 600 MG tablet Take 1 tablet (600 mg total) by mouth daily. 90 tablet 3   carbidopa-levodopa (SINEMET IR) 10-100 MG tablet Take 1 tablet by mouth in the morning. 90 tablet 11   carvedilol (COREG) 25 MG tablet Take 1 tablet (25 mg total) by mouth 2 (two) times daily with a meal. 180 tablet 3   Cholecalciferol (VITAMIN D3) 2000 UNITS capsule Take 2,000 Units by mouth daily.     cyanocobalamin 2000 MCG tablet Take 2,000 mcg by mouth daily.     diclofenac sodium (VOLTAREN) 1 % GEL Apply 2 g topically 2 (two) times daily. 200 g 3   docusate sodium (COLACE) 100 MG capsule Take 1 capsule (100 mg total) by mouth 2 (two) times daily. 60 capsule 11   dorzolamide-timolol (COSOPT) 22.3-6.8 MG/ML ophthalmic solution Place 1 drop into both eyes 2 (two) times daily.      feeding supplement, ENSURE ENLIVE, (ENSURE ENLIVE) LIQD Take 237 mLs by mouth daily at 2 PM. 237 mL 12   feeding supplement, GLUCERNA SHAKE, (GLUCERNA SHAKE) LIQD Take 237 mLs by mouth 2 (two) times daily between meals.  0   gabapentin (NEURONTIN) 100 MG capsule TAKE 1 CAPSULE BY MOUTH THREE TIMES A DAY 90 capsule 11   glucose blood (GNP TRUE METRIX GLUCOSE STRIPS) test strip Use to check blood sugars twice a day 200 each 2   latanoprost (XALATAN) 0.005 % ophthalmic solution Place 1 drop into both eyes at bedtime.      losartan (COZAAR) 25 MG tablet Take 1 tablet (25 mg total) by mouth in the morning and at bedtime. 180 tablet 3   metFORMIN (GLUCOPHAGE) 500 MG tablet TAKE 1 TABLET BY MOUTH EVERY DAY WITH BREAKFAST 30 tablet 11   NON FORMULARY Place 10 drops under the tongue daily as needed (Pain). CBD oil     pantoprazole (PROTONIX) 40 MG tablet TAKE 1 TABLET BY MOUTH EVERY DAY 30 tablet 11   polyethylene glycol (MIRALAX / GLYCOLAX) 17 g packet Take 17 g by mouth daily as needed for mild constipation. 14 each 0   senna (SENOKOT) 8.6 MG TABS tablet Take 1 tablet (8.6 mg total) by mouth 2 (two) times daily. 120 tablet 0    sertraline (ZOLOFT) 100 MG tablet TAKE 1 TABLET BY MOUTH EVERY DAY 30 tablet 11   spironolactone (ALDACTONE) 25 MG tablet Take by mouth.     TRUEplus Lancets 33G MISC Use as directed to check blood sugars twice a day 200 each 0   No current facility-administered medications on file prior to visit.   Allergies  Allergen Reactions   Verapamil Shortness Of Breath    REACTION: SOB   Aspirin Itching    But tolerates low dose   Atenolol     REACTION: fatigue   Codeine Itching   Codeine Sulfate    Hydrochlorothiazide     REACTION: gout   Hydrocodone       side effects -  hallucinations   Ibuprofen     Upset stomach w/high doses   Lisinopril     REACTION: cough   Onion Other (See Comments)    Dry mouth/ gets sores   Shellfish Allergy Swelling    Patient stated she does not eat shellfish, she swells up on different parts of the body   Valsartan Itching   Adhesive  [Tape] Rash   Other Rash      Objective: General: Patient is awake, alert, and oriented x 3 and in no acute distress.  Integument: Skin is warm, dry and supple bilateral. Nails are tender, long, thickened and dystrophic with subungual debris, consistent with onychomycosis, 1-5 on left and 1,3-5 on right. No signs of infection. Minimal callus at distal tuft at 3rd toes bilateral.  Reactive callus secondary to get plantar lateral right heel.  No signs of infection.  Remaining integument unremarkable.  Neurovascular status unchanged since last visit. Trace edema at ankles R>L.  Musculoskeletal: No reproducible pain to right lateral foot at 5th met base but there is some pain at posterior heel on right at area of reactive callus secondary to gait. Asymptomatic hammertoe and pes planus pedal deformities noted bilateral. Muscular strength 5/5 in all lower extremity muscular groups bilateral without pain on range of motion. Achilles intact on right.. No tenderness with calf compression bilateral.  Assessment and  Plan: Problem List Items Addressed This Visit       Musculoskeletal and Integument   Callus of foot   Other Visit Diagnoses     Pain due to onychomycosis of toenails of both feet    -  Primary   Pes planus of both feet       Diabetes mellitus without complication ([place])           -Examined patient. -Re-discussed and educated patient on diabetic foot care, especially with  regards to the vascular, neurological and musculoskeletal systems.  -Mechanically debrided all nails 1-5 bilateral using sterile nail nipper and filed with dremel without incident  -Mechanically debrided callus at tips of third toes bilateral using a sterile 15 blade without incident at no additional charge -Advised patient to try pillow offloading and topical Voltaren or pain cream to the callused area to the right lateral heel advised patient that she gets this secondary to her gait -Continue with good supportive shoes for foot type  -Patient to return  in 3 months for at risk foot care  -Patient advised to call the office if any problems or questions arise in the meantime.  Landis Martins, DPM

## 2021-06-20 ENCOUNTER — Other Ambulatory Visit: Payer: Self-pay

## 2021-06-20 ENCOUNTER — Other Ambulatory Visit: Payer: Medicare PPO

## 2021-06-22 ENCOUNTER — Other Ambulatory Visit: Payer: Self-pay | Admitting: *Deleted

## 2021-06-22 MED ORDER — VITAMIN D3 25 MCG (1000 UT) PO CAPS: 1000.0000 [IU] | ORAL_CAPSULE | Freq: Every day | ORAL | 3 refills | Status: DC

## 2021-06-29 ENCOUNTER — Other Ambulatory Visit: Payer: Self-pay | Admitting: Internal Medicine

## 2021-07-17 ENCOUNTER — Telehealth: Payer: Self-pay | Admitting: Internal Medicine

## 2021-07-17 DIAGNOSIS — Z9841 Cataract extraction status, right eye: Secondary | ICD-10-CM | POA: Diagnosis not present

## 2021-07-17 DIAGNOSIS — Z947 Corneal transplant status: Secondary | ICD-10-CM | POA: Diagnosis not present

## 2021-07-17 DIAGNOSIS — Z961 Presence of intraocular lens: Secondary | ICD-10-CM | POA: Diagnosis not present

## 2021-07-17 DIAGNOSIS — H401133 Primary open-angle glaucoma, bilateral, severe stage: Secondary | ICD-10-CM | POA: Diagnosis not present

## 2021-07-17 LAB — HM DIABETES EYE EXAM

## 2021-07-17 MED ORDER — CYANOCOBALAMIN 1000 MCG PO TABS: 1000.0000 ug | ORAL_TABLET | Freq: Every day | ORAL | 3 refills | Status: DC

## 2021-07-17 NOTE — Telephone Encounter (Signed)
[facility] called   Requesting a new order for cyanocobalamin 1000 MCG instead of the 2000 MCG.   Please advise.

## 2021-07-17 NOTE — Telephone Encounter (Signed)
Sent updated script to [facility].Marland KitchenJohny Chess

## 2021-07-20 ENCOUNTER — Other Ambulatory Visit: Payer: Self-pay | Admitting: Internal Medicine

## 2021-07-27 ENCOUNTER — Telehealth: Payer: Self-pay | Admitting: Internal Medicine

## 2021-07-27 NOTE — Telephone Encounter (Signed)
Pt states she has taken HTN medication. Son states systolic [id number]; son states pt was lightheaded with fall x 2 yesterday.  Pt states she is not hurt.  Pt endorses a mild h/a unsure of timeline. PCP has no open appts; appt scheduled with Dr Jenny Reichmann at 1020.  Son advised if pt has increased symptoms or he is concerned re: BP to go to ED.  Son verb understanding.

## 2021-07-27 NOTE — Telephone Encounter (Signed)
[name] calling in about patient BP  having trouble getting BP down.. currently running around [place] callback [phone]

## 2021-07-28 ENCOUNTER — Ambulatory Visit: Payer: Medicare PPO | Admitting: Internal Medicine

## 2021-07-28 ENCOUNTER — Encounter: Payer: Self-pay | Admitting: Internal Medicine

## 2021-07-28 ENCOUNTER — Other Ambulatory Visit: Payer: Self-pay

## 2021-07-28 VITALS — BP 186/86 | HR 59 | Temp 98.2°F | Ht 64.0 in | Wt 124.0 lb

## 2021-07-28 DIAGNOSIS — M25559 Pain in unspecified hip: Secondary | ICD-10-CM

## 2021-07-28 DIAGNOSIS — E1121 Type 2 diabetes mellitus with diabetic nephropathy: Secondary | ICD-10-CM | POA: Diagnosis not present

## 2021-07-28 DIAGNOSIS — R259 Unspecified abnormal involuntary movements: Secondary | ICD-10-CM

## 2021-07-28 DIAGNOSIS — E559 Vitamin D deficiency, unspecified: Secondary | ICD-10-CM

## 2021-07-28 DIAGNOSIS — R627 Adult failure to thrive: Secondary | ICD-10-CM | POA: Diagnosis not present

## 2021-07-28 DIAGNOSIS — E538 Deficiency of other specified B group vitamins: Secondary | ICD-10-CM

## 2021-07-28 DIAGNOSIS — I10 Essential (primary) hypertension: Secondary | ICD-10-CM | POA: Diagnosis not present

## 2021-07-28 DIAGNOSIS — W19XXXD Unspecified fall, subsequent encounter: Secondary | ICD-10-CM | POA: Diagnosis not present

## 2021-07-28 DIAGNOSIS — R29818 Other symptoms and signs involving the nervous system: Secondary | ICD-10-CM

## 2021-07-28 DIAGNOSIS — Y92009 Unspecified place in unspecified non-institutional (private) residence as the place of occurrence of the external cause: Secondary | ICD-10-CM

## 2021-07-28 LAB — BASIC METABOLIC PANEL
BUN: 32 mg/dL — ABNORMAL HIGH (ref 6–23)
CO2: 29 mEq/L (ref 19–32)
Calcium: 9.6 mg/dL (ref 8.4–10.5)
Chloride: 103 mEq/L (ref 96–112)
Creatinine, Ser: 1.23 mg/dL — ABNORMAL HIGH (ref 0.40–1.20)
GFR: 39.4 mL/min — ABNORMAL LOW (ref >60.00)
Glucose, Bld: 88 mg/dL (ref 70–99)
Potassium: 4.2 mEq/L (ref 3.5–5.1)
Sodium: 140 mEq/L (ref 135–145)

## 2021-07-28 LAB — TSH: TSH: 0.85 u[IU]/mL (ref 0.35–5.50)

## 2021-07-28 LAB — LIPID PANEL
Cholesterol: 155 mg/dL (ref 0–200)
HDL: 56.6 mg/dL (ref >39.00)
LDL Cholesterol: 83 mg/dL (ref 0–99)
NonHDL: 98.3
Total CHOL/HDL Ratio: 3
Triglycerides: 78 mg/dL (ref 0.0–149.0)
VLDL: 15.6 mg/dL (ref 0.0–40.0)

## 2021-07-28 LAB — CBC WITH DIFFERENTIAL/PLATELET
Basophils Absolute: 0 10*3/uL (ref 0.0–0.1)
Basophils Relative: 0.4 % (ref 0.0–3.0)
Eosinophils Absolute: 0.2 10*3/uL (ref 0.0–0.7)
Eosinophils Relative: 4.9 % (ref 0.0–5.0)
HCT: 34.9 % — ABNORMAL LOW (ref 36.0–46.0)
Hemoglobin: 11 g/dL — ABNORMAL LOW (ref 12.0–15.0)
Lymphocytes Relative: 18.2 % (ref 12.0–46.0)
Lymphs Abs: 0.9 10*3/uL (ref 0.7–4.0)
MCHC: 31.6 g/dL (ref 30.0–36.0)
MCV: 91 fl (ref 78.0–100.0)
Monocytes Absolute: 0.4 10*3/uL (ref 0.1–1.0)
Monocytes Relative: 7.8 % (ref 3.0–12.0)
Neutro Abs: 3.3 10*3/uL (ref 1.4–7.7)
Neutrophils Relative %: 68.7 % (ref 43.0–77.0)
Platelets: 131 10*3/uL — ABNORMAL LOW (ref 150.0–400.0)
RBC: 3.84 Mil/uL — ABNORMAL LOW (ref 3.87–5.11)
RDW: 16.4 % — ABNORMAL HIGH (ref 11.5–15.5)
WBC: 4.8 10*3/uL (ref 4.0–10.5)

## 2021-07-28 LAB — URINALYSIS, ROUTINE W REFLEX MICROSCOPIC
Bilirubin Urine: NEGATIVE
Hgb urine dipstick: NEGATIVE
Ketones, ur: NEGATIVE
Nitrite: NEGATIVE
Specific Gravity, Urine: 1.015 (ref 1.000–1.030)
Total Protein, Urine: 100 — AB
Urine Glucose: NEGATIVE
Urobilinogen, UA: 0.2 (ref 0.0–1.0)
pH: 6.5 (ref 5.0–8.0)

## 2021-07-28 LAB — HEPATIC FUNCTION PANEL
ALT: 9 U/L (ref 0–35)
AST: 19 U/L (ref 0–37)
Albumin: 3.7 g/dL (ref 3.5–5.2)
Alkaline Phosphatase: 101 U/L (ref 39–117)
Bilirubin, Direct: 0.1 mg/dL (ref 0.0–0.3)
Total Bilirubin: 0.4 mg/dL (ref 0.2–1.2)
Total Protein: 7.4 g/dL (ref 6.0–8.3)

## 2021-07-28 LAB — HEMOGLOBIN A1C: Hgb A1c MFr Bld: 6.3 % (ref 4.6–6.5)

## 2021-07-28 LAB — VITAMIN B12: Vitamin B-12: >1550 pg/mL — ABNORMAL HIGH (ref 211–911)

## 2021-07-28 LAB — VITAMIN D 25 HYDROXY (VIT D DEFICIENCY, FRACTURES): VITD: 51.9 ng/mL (ref 30.00–100.00)

## 2021-07-28 MED ORDER — LOSARTAN POTASSIUM 50 MG PO TABS: 50.0000 mg | ORAL_TABLET | Freq: Every day | ORAL | 3 refills | Status: DC

## 2021-07-28 MED ORDER — SPIRONOLACTONE 25 MG PO TABS: ORAL_TABLET | ORAL | 3 refills | Status: DC

## 2021-07-28 NOTE — Progress Notes (Signed)
Patient ID: Breanna [name], female   DOB: 02-04-1931, 87 y.o.   MRN: [id number]        Chief Complaint: follow up recent fall x 2       HPI:  Breanna [name] is a 87 y.o. female here with family with c/o fall x 2 in 1 day - 2 days ago for unclear reason.  Has been taking less fluids recently per family with generalized weakness    May have simply lost her balance, walking with walker today. Has hx of parkinson features, FTT, dehydration, and prior falls noted on chart.   Pt denies chest pain, increased sob or doe, wheezing, orthopnea, PND, increased LE swelling, palpitations, dizziness or syncope.   Pt denies polydipsia, polyuria, or new focal neuro s/s.   Pt denies fever, wt loss, night sweats, loss of appetite, or other constitutional symptoms Denies urinary symptoms such as dysuria, frequency, urgency, flank pain, hematuria or n/v, fever, chills.    Has some soreness over the right lateal hip area but minor and pt states no specific injury o/w and declines need for xrays.  No recent hx of overt bleeding.  Good compliance with meds - BP at home has been 150's.         Wt Readings from Last 3 Encounters:  07/28/21 124 lb (56.2 kg)  05/23/21 129 lb (58.5 kg)  01/19/21 132 lb 12.8 oz (60.2 kg)   BP Readings from Last 3 Encounters:  07/28/21 (!) 186/86  05/23/21 (!) 172/86  01/19/21 (!) 142/70         Past Medical History:  Diagnosis Date   Adrenal adenoma 2006   Boils 2009   Cholelithiasis    asympt. w/normal HIDA 03/2010   CVA (cerebral infarction) 2010   Cerebellar   Diverticulitis    Esophageal spasm 2011   GERD (gastroesophageal reflux disease)    Gout    History of colon polyps    HTN (hypertension)    Hydronephrosis    LEFT/ Surgical intervention   Hyperlipidemia    LBP (low back pain)    Osteoarthritis    Pulmonary HTN (HCC)    Stress    Type II or unspecified type diabetes mellitus without mention of complication, not stated as uncontrolled    Past Surgical History:   Procedure Laterality Date   ABDOMINAL HYSTERECTOMY     APPENDECTOMY     2020   BACK SURGERY     BREAST BIOPSY     RIGHT   CATARACT EXTRACTION, BILATERAL     COLECTOMY  2006   Sigmoid   FOOT SURGERY     BILATERAL   HAMMER TOE SURGERY     INTRAMEDULLARY (IM) NAIL INTERTROCHANTERIC Right 07/21/2020   Procedure: INTRAMEDULLARY (IM) NAIL INTERTROCHANTRIC;  Surgeon: Renette Butters, MD;  Location: [facility];  Service: Orthopedics;  Laterality: Right;   ROTATOR CUFF REPAIR  2008   RIGHT   TOTAL KNEE ARTHROPLASTY  2003   LEFT   VARICOSE VEIN SURGERY     vein stripping/lower extremities    reports that she has never smoked. She has never used smokeless tobacco. She reports that she does not drink alcohol and does not use drugs. family history includes Breast cancer in her daughter; Cancer in her daughter, father, and maternal uncle; Diabetes in her mother and another family member; Heart disease in her mother and another family member; Hypertension in her father and another family member; Prostate cancer in her father and maternal uncle.  Allergies  Allergen Reactions   Verapamil Shortness Of Breath    REACTION: SOB   Aspirin Itching    But tolerates low dose   Atenolol     REACTION: fatigue   Codeine Itching   Codeine Sulfate    Hydrochlorothiazide     REACTION: gout   Hydrocodone       side effects - hallucinations   Ibuprofen     Upset stomach w/high doses   Lisinopril     REACTION: cough   Onion Other (See Comments)    Dry mouth/ gets sores   Shellfish Allergy Swelling    Patient stated she does not eat shellfish, she swells up on different parts of the body   Valsartan Itching   Adhesive  [Tape] Rash   Other Rash   Current Outpatient Medications on File Prior to Visit  Medication Sig Dispense Refill   acetaminophen (TYLENOL) 325 MG tablet Take 2 tablets (650 mg total) by mouth every 6 (six) hours as needed for moderate pain or fever.     Alcohol Swabs (TRUE COMFORT  ALCOHOL PREP PADS) 70 % PADS Use as directed 300 each 1   allopurinol (ZYLOPRIM) 300 MG tablet TAKE 1 TABLET BY MOUTH EVERY DAY 30 tablet 11   amLODipine (NORVASC) 5 MG tablet TAKE 1 TABLET BY MOUTH EVERY DAY 30 tablet 11   Ascorbic Acid (VITAMIN C) 500 MG CAPS Take 1 capsule by mouth daily. 90 capsule 3   aspirin 81 MG EC tablet Take 1 tablet (81 mg total) by mouth daily. Swallow whole. 90 tablet 3   atropine 1 % ophthalmic solution atropine 1 % eye drops     Blood Glucose Calibration (TRUE METRIX LEVEL 1) Low SOLN USE AS DIRECTED 3 each 3   calcium carbonate (OS-CAL) 600 MG tablet Take 1 tablet (600 mg total) by mouth daily. 90 tablet 3   carbidopa-levodopa (SINEMET IR) 10-100 MG tablet Take 1 tablet by mouth in the morning. 90 tablet 11   carvedilol (COREG) 25 MG tablet Take 1 tablet (25 mg total) by mouth 2 (two) times daily with a meal. 180 tablet 3   Cholecalciferol (VITAMIN D3) 25 MCG (1000 UT) CAPS Take 1 capsule (1,000 Units total) by mouth daily. 90 capsule 3   cyanocobalamin 1000 MCG tablet Take 1 tablet (1,000 mcg total) by mouth daily. 90 tablet 3   diclofenac sodium (VOLTAREN) 1 % GEL Apply 2 g topically 2 (two) times daily. 200 g 3   docusate sodium (COLACE) 100 MG capsule TAKE 1 CAPSULE BY MOUTH TWICE A DAY 60 capsule 11   dorzolamide-timolol (COSOPT) 22.3-6.8 MG/ML ophthalmic solution Place 1 drop into both eyes 2 (two) times daily.      feeding supplement, ENSURE ENLIVE, (ENSURE ENLIVE) LIQD Take 237 mLs by mouth daily at 2 PM. 237 mL 12   feeding supplement, GLUCERNA SHAKE, (GLUCERNA SHAKE) LIQD Take 237 mLs by mouth 2 (two) times daily between meals.  0   gabapentin (NEURONTIN) 100 MG capsule TAKE 1 CAPSULE BY MOUTH THREE TIMES A DAY 90 capsule 11   glucose blood (GNP TRUE METRIX GLUCOSE STRIPS) test strip Use to check blood sugars twice a day 200 each 2   latanoprost (XALATAN) 0.005 % ophthalmic solution Place 1 drop into both eyes at bedtime.      lidocaine-prilocaine (EMLA)  cream Apply 1 application topically as needed. 30 g 0   metFORMIN (GLUCOPHAGE) 500 MG tablet TAKE 1 TABLET BY MOUTH EVERY DAY WITH BREAKFAST  30 tablet 11   NON FORMULARY Place 10 drops under the tongue daily as needed (Pain). CBD oil     pantoprazole (PROTONIX) 40 MG tablet TAKE 1 TABLET BY MOUTH EVERY DAY 30 tablet 11   polyethylene glycol (MIRALAX / GLYCOLAX) 17 g packet Take 17 g by mouth daily as needed for mild constipation. 14 each 0   senna (SENOKOT) 8.6 MG TABS tablet Take 1 tablet (8.6 mg total) by mouth 2 (two) times daily. 120 tablet 0   sertraline (ZOLOFT) 100 MG tablet TAKE 1 TABLET BY MOUTH EVERY DAY 30 tablet 11   TRUEplus Lancets 33G MISC USE AS DIRECTED TO CHECK BLOOD SUGARS TWICE A DAY 200 each 0   No current facility-administered medications on file prior to visit.        ROS:  All others reviewed and negative.  Objective        PE:  BP (!) 186/86 (BP Location: Right Arm, Patient Position: Sitting, Cuff Size: Large)   Pulse (!) 59   Temp 98.2 F (36.8 C) (Oral)   Ht '5\' 4"'$  (1.626 m)   Wt 124 lb (56.2 kg)   SpO2 98%   BMI 21.28 kg/m                 Constitutional: Pt appears in NAD               HENT: Head: NCAT.                Right Ear: External ear normal.                 Left Ear: External ear normal.                Eyes: . Pupils are equal, round, and reactive to light. Conjunctivae and EOM are normal               Nose: without d/c or deformity               Neck: Neck supple. Gross normal ROM               Cardiovascular: Normal rate and regular rhythm.                 Pulmonary/Chest: Effort normal and breath sounds without rales or wheezing.                Abd:  Soft, NT, ND, + BS, no organomegaly               Neurological: Pt is alert. At baseline orientation, motor grossly intact               Skin: Skin is warm. No rashes, no other new lesions, LE edema - none               Psychiatric: Pt behavior is normal without agitation   Micro:  none  Cardiac tracings I have personally interpreted today:  none  Pertinent Radiological findings (summarize): none   Lab Results  Component Value Date   WBC 4.8 07/28/2021   HGB 11.0 (L) 07/28/2021   HCT 34.9 (L) 07/28/2021   PLT 131.0 (L) 07/28/2021   GLUCOSE 88 07/28/2021   CHOL 155 07/28/2021   TRIG 78.0 07/28/2021   HDL 56.60 07/28/2021   LDLCALC 83 07/28/2021   ALT 9 07/28/2021   AST 19 07/28/2021   NA 140 07/28/2021   K 4.2 07/28/2021   CL 103 07/28/2021  CREATININE 1.23 (H) 07/28/2021   BUN 32 (H) 07/28/2021   CO2 29 07/28/2021   TSH 0.85 07/28/2021   HGBA1C 6.3 07/28/2021   Assessment/Plan:  Breanna [name] is a 87 y.o. Black or African American [2] female with  has a past medical history of Adrenal adenoma (2006), Boils (2009), Cholelithiasis, CVA (cerebral infarction) (2010), Diverticulitis, Esophageal spasm (2011), GERD (gastroesophageal reflux disease), Gout, History of colon polyps, HTN (hypertension), Hydronephrosis, Hyperlipidemia, LBP (low back pain), Osteoarthritis, Pulmonary HTN ([place]), Stress, and Type II or unspecified type diabetes mellitus without mention of complication, not stated as uncontrolled.  Type 2 diabetes mellitus with renal manifestations, controlled ([place]) Lab Results  Component Value Date   HGBA1C 6.3 07/28/2021   Stable, pt to continue current medical treatment metformin   Hip pain Pt has minor soreness over right lateral hip area, pt and family declines need for film today  Fall at home Etiology unclear, for increased fluids at home, labs including cbc today, and ok for home PT  Essential hypertension Chronic difficult to control, for increased losartan 50 mg,  to f/u any worsening symptoms or concerns  Followup: Return in about 1 week (around 08/04/2021), or to Dr Alain Marion.  Cathlean Cower, MD 07/29/2021 4:00 PM [facility]

## 2021-07-28 NOTE — Patient Instructions (Addendum)
Ok to increase the losartan to 50 mg per day  Please continue all other medications as before, and refills have been done if requested.  Please have the pharmacy call with any other refills you may need.  Please remember to drink enough fluids every day  Please keep your appointments with your specialists as you may have planned  You will be contacted regarding the referral for: Physical Therapy at your house  Please go to the LAB at the blood drawing area for the tests to be done  You will be contacted by phone if any changes need to be made immediately.  Otherwise, you will receive a letter about your results with an explanation, but please check with MyChart first.  Please remember to sign up for MyChart if you have not done so, as this will be important to you in the future with finding out test results, communicating by private email, and scheduling acute appointments online when needed.  Please see Dr Alain Marion in 3 weeks

## 2021-07-28 NOTE — Telephone Encounter (Signed)
Agree with office visit to see Dr. Jenny Reichmann. Thx

## 2021-07-29 ENCOUNTER — Encounter: Payer: Self-pay | Admitting: Internal Medicine

## 2021-07-29 NOTE — Assessment & Plan Note (Signed)
Lab Results  Component Value Date   HGBA1C 6.3 07/28/2021   Stable, pt to continue current medical treatment metformin

## 2021-07-29 NOTE — Assessment & Plan Note (Addendum)
Etiology unclear, for increased fluids at home, labs including cbc today, and ok for home PT

## 2021-07-29 NOTE — Assessment & Plan Note (Signed)
Chronic difficult to control, for increased losartan 50 mg,  to f/u any worsening symptoms or concerns

## 2021-07-29 NOTE — Assessment & Plan Note (Signed)
Pt has minor soreness over right lateral hip area, pt and family declines need for film today

## 2021-07-30 ENCOUNTER — Encounter: Payer: Self-pay | Admitting: Internal Medicine

## 2021-07-31 ENCOUNTER — Telehealth: Payer: Self-pay | Admitting: Internal Medicine

## 2021-07-31 NOTE — Telephone Encounter (Signed)
Please call patient to clarify dosage of losartan.  Patient was previously taking '25mg'$  twice a day, Dr Jenny Reichmann changed to '50mg'$  once a day? Same dose, no change?  Please call

## 2021-08-01 DIAGNOSIS — D485 Neoplasm of uncertain behavior of skin: Secondary | ICD-10-CM | POA: Diagnosis not present

## 2021-08-01 DIAGNOSIS — L989 Disorder of the skin and subcutaneous tissue, unspecified: Secondary | ICD-10-CM | POA: Diagnosis not present

## 2021-08-01 NOTE — Telephone Encounter (Signed)
Called pt and lm for pt to call the clinic back to clarify her medication dosage.  **Pt is now to take '50MG'$  of Losartan 1x a day. Pt was previously taking '25mg'$  2x a day.

## 2021-08-01 NOTE — Telephone Encounter (Signed)
   Son calling to report he has not increased Losartan at this time BP today 147/73 BP last night 143/83

## 2021-08-01 NOTE — Telephone Encounter (Signed)
Patient calling back about Losartan dosage  At previous check in patient was taking '25mg'$  2x daily ('50mg'$  total) patient says that Dr. Jenny Reichmann advised to increase dosage but did not say how much to increase.  Wants to know if its okay to still continue w/ the '50mg'$  1x daily or increase more than '50mg'$  daily?  Please callback [phone]

## 2021-08-02 NOTE — Telephone Encounter (Signed)
OK. Thx

## 2021-08-02 NOTE — Telephone Encounter (Signed)
Notified son Zenia Resides) w/MD response.Marland KitchenJohny Chess

## 2021-08-04 DIAGNOSIS — Z8673 Personal history of transient ischemic attack (TIA), and cerebral infarction without residual deficits: Secondary | ICD-10-CM | POA: Diagnosis not present

## 2021-08-04 DIAGNOSIS — Z7984 Long term (current) use of oral hypoglycemic drugs: Secondary | ICD-10-CM | POA: Diagnosis not present

## 2021-08-04 DIAGNOSIS — I129 Hypertensive chronic kidney disease with stage 1 through stage 4 chronic kidney disease, or unspecified chronic kidney disease: Secondary | ICD-10-CM | POA: Diagnosis not present

## 2021-08-04 DIAGNOSIS — Z888 Allergy status to other drugs, medicaments and biological substances status: Secondary | ICD-10-CM | POA: Diagnosis not present

## 2021-08-04 DIAGNOSIS — G8929 Other chronic pain: Secondary | ICD-10-CM | POA: Diagnosis not present

## 2021-08-04 DIAGNOSIS — Z803 Family history of malignant neoplasm of breast: Secondary | ICD-10-CM | POA: Diagnosis not present

## 2021-08-04 DIAGNOSIS — Z886 Allergy status to analgesic agent status: Secondary | ICD-10-CM | POA: Diagnosis not present

## 2021-08-04 DIAGNOSIS — F3342 Major depressive disorder, recurrent, in full remission: Secondary | ICD-10-CM | POA: Diagnosis not present

## 2021-08-04 DIAGNOSIS — E1151 Type 2 diabetes mellitus with diabetic peripheral angiopathy without gangrene: Secondary | ICD-10-CM | POA: Diagnosis not present

## 2021-08-04 DIAGNOSIS — Z885 Allergy status to narcotic agent status: Secondary | ICD-10-CM | POA: Diagnosis not present

## 2021-08-04 DIAGNOSIS — I10 Essential (primary) hypertension: Secondary | ICD-10-CM | POA: Diagnosis not present

## 2021-08-04 DIAGNOSIS — Z823 Family history of stroke: Secondary | ICD-10-CM | POA: Diagnosis not present

## 2021-08-04 DIAGNOSIS — R32 Unspecified urinary incontinence: Secondary | ICD-10-CM | POA: Diagnosis not present

## 2021-08-04 DIAGNOSIS — K219 Gastro-esophageal reflux disease without esophagitis: Secondary | ICD-10-CM | POA: Diagnosis not present

## 2021-08-04 DIAGNOSIS — Z833 Family history of diabetes mellitus: Secondary | ICD-10-CM | POA: Diagnosis not present

## 2021-08-04 DIAGNOSIS — H409 Unspecified glaucoma: Secondary | ICD-10-CM | POA: Diagnosis not present

## 2021-08-04 DIAGNOSIS — E1142 Type 2 diabetes mellitus with diabetic polyneuropathy: Secondary | ICD-10-CM | POA: Diagnosis not present

## 2021-08-04 DIAGNOSIS — E1122 Type 2 diabetes mellitus with diabetic chronic kidney disease: Secondary | ICD-10-CM | POA: Diagnosis not present

## 2021-08-10 ENCOUNTER — Other Ambulatory Visit: Payer: Self-pay

## 2021-08-10 ENCOUNTER — Ambulatory Visit (INDEPENDENT_AMBULATORY_CARE_PROVIDER_SITE_OTHER): Payer: Medicare PPO | Admitting: *Deleted

## 2021-08-10 DIAGNOSIS — L84 Corns and callosities: Secondary | ICD-10-CM

## 2021-08-10 DIAGNOSIS — M2042 Other hammer toe(s) (acquired), left foot: Secondary | ICD-10-CM

## 2021-08-10 DIAGNOSIS — M2141 Flat foot [pes planus] (acquired), right foot: Secondary | ICD-10-CM

## 2021-08-10 DIAGNOSIS — E119 Type 2 diabetes mellitus without complications: Secondary | ICD-10-CM

## 2021-08-10 DIAGNOSIS — M2142 Flat foot [pes planus] (acquired), left foot: Secondary | ICD-10-CM

## 2021-08-10 DIAGNOSIS — M2041 Other hammer toe(s) (acquired), right foot: Secondary | ICD-10-CM

## 2021-08-10 NOTE — Progress Notes (Signed)
Patient presents today to pick up diabetic shoes and insoles.  Patient was dispensed 1 pair of diabetic shoes and 3 pairs of foam casted diabetic insoles. She tried on Orthofeet 975 women's 7 wide.  She says that they feel too tight, her big toe was touching the end of the shoe.   We will reorder Orthofeet 975 Women's 7.5 Wide  Patient will be notified once they arrive in office.

## 2021-08-15 DIAGNOSIS — E1121 Type 2 diabetes mellitus with diabetic nephropathy: Secondary | ICD-10-CM | POA: Diagnosis not present

## 2021-08-15 DIAGNOSIS — I10 Essential (primary) hypertension: Secondary | ICD-10-CM | POA: Diagnosis not present

## 2021-08-15 DIAGNOSIS — G20 Parkinson's disease: Secondary | ICD-10-CM | POA: Diagnosis not present

## 2021-08-15 DIAGNOSIS — M109 Gout, unspecified: Secondary | ICD-10-CM | POA: Diagnosis not present

## 2021-08-15 DIAGNOSIS — E1140 Type 2 diabetes mellitus with diabetic neuropathy, unspecified: Secondary | ICD-10-CM | POA: Diagnosis not present

## 2021-08-15 DIAGNOSIS — I272 Pulmonary hypertension, unspecified: Secondary | ICD-10-CM | POA: Diagnosis not present

## 2021-08-15 DIAGNOSIS — M1990 Unspecified osteoarthritis, unspecified site: Secondary | ICD-10-CM | POA: Diagnosis not present

## 2021-08-15 DIAGNOSIS — I700 Atherosclerosis of aorta: Secondary | ICD-10-CM | POA: Diagnosis not present

## 2021-08-15 DIAGNOSIS — M810 Age-related osteoporosis without current pathological fracture: Secondary | ICD-10-CM | POA: Diagnosis not present

## 2021-08-17 ENCOUNTER — Encounter: Payer: Self-pay | Admitting: Internal Medicine

## 2021-08-18 ENCOUNTER — Encounter: Payer: Self-pay | Admitting: Internal Medicine

## 2021-08-18 ENCOUNTER — Telehealth: Payer: Self-pay | Admitting: *Deleted

## 2021-08-18 NOTE — Telephone Encounter (Signed)
Abstracted diabetic exam../lmb

## 2021-08-22 ENCOUNTER — Ambulatory Visit: Payer: Medicare PPO | Admitting: Internal Medicine

## 2021-08-22 ENCOUNTER — Encounter: Payer: Self-pay | Admitting: Internal Medicine

## 2021-08-22 ENCOUNTER — Other Ambulatory Visit: Payer: Self-pay

## 2021-08-22 DIAGNOSIS — I700 Atherosclerosis of aorta: Secondary | ICD-10-CM | POA: Diagnosis not present

## 2021-08-22 DIAGNOSIS — M810 Age-related osteoporosis without current pathological fracture: Secondary | ICD-10-CM | POA: Diagnosis not present

## 2021-08-22 DIAGNOSIS — E1140 Type 2 diabetes mellitus with diabetic neuropathy, unspecified: Secondary | ICD-10-CM | POA: Diagnosis not present

## 2021-08-22 DIAGNOSIS — E1142 Type 2 diabetes mellitus with diabetic polyneuropathy: Secondary | ICD-10-CM

## 2021-08-22 DIAGNOSIS — E1121 Type 2 diabetes mellitus with diabetic nephropathy: Secondary | ICD-10-CM | POA: Diagnosis not present

## 2021-08-22 DIAGNOSIS — M1990 Unspecified osteoarthritis, unspecified site: Secondary | ICD-10-CM | POA: Diagnosis not present

## 2021-08-22 DIAGNOSIS — G20 Parkinson's disease: Secondary | ICD-10-CM | POA: Diagnosis not present

## 2021-08-22 DIAGNOSIS — I10 Essential (primary) hypertension: Secondary | ICD-10-CM

## 2021-08-22 DIAGNOSIS — B001 Herpesviral vesicular dermatitis: Secondary | ICD-10-CM

## 2021-08-22 DIAGNOSIS — Z8673 Personal history of transient ischemic attack (TIA), and cerebral infarction without residual deficits: Secondary | ICD-10-CM

## 2021-08-22 DIAGNOSIS — M109 Gout, unspecified: Secondary | ICD-10-CM | POA: Diagnosis not present

## 2021-08-22 DIAGNOSIS — I272 Pulmonary hypertension, unspecified: Secondary | ICD-10-CM | POA: Diagnosis not present

## 2021-08-22 DIAGNOSIS — W1800XA Striking against unspecified object with subsequent fall, initial encounter: Secondary | ICD-10-CM

## 2021-08-22 MED ORDER — VALACYCLOVIR HCL 500 MG PO TABS: 500.0000 mg | ORAL_TABLET | Freq: Two times a day (BID) | ORAL | 1 refills | Status: DC

## 2021-08-22 NOTE — Assessment & Plan Note (Signed)
Valtrex prn  

## 2021-08-22 NOTE — Progress Notes (Signed)
Subjective:  Patient ID: Breanna [name], female    DOB: Apr 29, 1931  Age: 87 y.o. MRN: [id number]  CC: Follow-up (3 week f/u)   HPI [facility] presents for a cold sore F/u HTN, DM, LBP SBP 140 at home  Outpatient Medications Prior to Visit  Medication Sig Dispense Refill   acetaminophen (TYLENOL) 325 MG tablet Take 2 tablets (650 mg total) by mouth every 6 (six) hours as needed for moderate pain or fever.     Alcohol Swabs (TRUE COMFORT ALCOHOL PREP PADS) 70 % PADS Use as directed 300 each 1   allopurinol (ZYLOPRIM) 300 MG tablet TAKE 1 TABLET BY MOUTH EVERY DAY 30 tablet 11   amLODipine (NORVASC) 5 MG tablet TAKE 1 TABLET BY MOUTH EVERY DAY 30 tablet 11   Ascorbic Acid (VITAMIN C) 500 MG CAPS Take 1 capsule by mouth daily. 90 capsule 3   aspirin 81 MG EC tablet Take 1 tablet (81 mg total) by mouth daily. Swallow whole. 90 tablet 3   atropine 1 % ophthalmic solution atropine 1 % eye drops     Blood Glucose Calibration (TRUE METRIX LEVEL 1) Low SOLN USE AS DIRECTED 3 each 3   calcium carbonate (OS-CAL) 600 MG tablet Take 1 tablet (600 mg total) by mouth daily. 90 tablet 3   carvedilol (COREG) 25 MG tablet Take 1 tablet (25 mg total) by mouth 2 (two) times daily with a meal. 180 tablet 3   Cholecalciferol (VITAMIN D3) 25 MCG (1000 UT) CAPS Take 1 capsule (1,000 Units total) by mouth daily. 90 capsule 3   cyanocobalamin 1000 MCG tablet Take 1 tablet (1,000 mcg total) by mouth daily. 90 tablet 3   diclofenac sodium (VOLTAREN) 1 % GEL Apply 2 g topically 2 (two) times daily. 200 g 3   docusate sodium (COLACE) 100 MG capsule TAKE 1 CAPSULE BY MOUTH TWICE A DAY 60 capsule 11   dorzolamide-timolol (COSOPT) 22.3-6.8 MG/ML ophthalmic solution Place 1 drop into both eyes 2 (two) times daily.      feeding supplement, ENSURE ENLIVE, (ENSURE ENLIVE) LIQD Take 237 mLs by mouth daily at 2 PM. 237 mL 12   feeding supplement, GLUCERNA SHAKE, (GLUCERNA SHAKE) LIQD Take 237 mLs by mouth 2 (two) times  daily between meals.  0   gabapentin (NEURONTIN) 100 MG capsule TAKE 1 CAPSULE BY MOUTH THREE TIMES A DAY 90 capsule 11   glucose blood (GNP TRUE METRIX GLUCOSE STRIPS) test strip Use to check blood sugars twice a day 200 each 2   latanoprost (XALATAN) 0.005 % ophthalmic solution Place 1 drop into both eyes at bedtime.      lidocaine-prilocaine (EMLA) cream Apply 1 application topically as needed. 30 g 0   losartan (COZAAR) 50 MG tablet Take 1 tablet (50 mg total) by mouth daily. 90 tablet 3   metFORMIN (GLUCOPHAGE) 500 MG tablet TAKE 1 TABLET BY MOUTH EVERY DAY WITH BREAKFAST 30 tablet 11   NON FORMULARY Place 10 drops under the tongue daily as needed (Pain). CBD oil     pantoprazole (PROTONIX) 40 MG tablet TAKE 1 TABLET BY MOUTH EVERY DAY 30 tablet 11   polyethylene glycol (MIRALAX / GLYCOLAX) 17 g packet Take 17 g by mouth daily as needed for mild constipation. 14 each 0   senna (SENOKOT) 8.6 MG TABS tablet Take 1 tablet (8.6 mg total) by mouth 2 (two) times daily. 120 tablet 0   sertraline (ZOLOFT) 100 MG tablet TAKE 1 TABLET BY MOUTH  EVERY DAY 30 tablet 11   spironolactone (ALDACTONE) 25 MG tablet 1 tab by mouth mon - wed - fri 90 tablet 3   TRUEplus Lancets 33G MISC USE AS DIRECTED TO CHECK BLOOD SUGARS TWICE A DAY 200 each 0   carbidopa-levodopa (SINEMET IR) 10-100 MG tablet Take 1 tablet by mouth in the morning. (Patient not taking: Reported on 08/22/2021) 90 tablet 11   No facility-administered medications prior to visit.    ROS: Review of Systems  Constitutional:  Positive for fatigue. Negative for activity change, appetite change, chills and unexpected weight change.  HENT:  Negative for congestion, mouth sores and sinus pressure.   Eyes:  Negative for visual disturbance.  Respiratory:  Negative for cough and chest tightness.   Gastrointestinal:  Negative for abdominal pain and nausea.  Genitourinary:  Negative for difficulty urinating, frequency and vaginal pain.   Musculoskeletal:  Positive for arthralgias, back pain and gait problem.  Skin:  Negative for pallor and rash.  Neurological:  Positive for weakness. Negative for dizziness, tremors, numbness and headaches.  Psychiatric/Behavioral:  Negative for confusion, sleep disturbance and suicidal ideas.    Objective:  BP (!) 178/80 (BP Location: Left Arm)   Pulse (!) 52   Temp 98.2 F (36.8 C) (Oral)   Ht '5\' 4"'$  (1.626 m)   Wt 125 lb 3.2 oz (56.8 kg)   SpO2 97%   BMI 21.49 kg/m   BP Readings from Last 3 Encounters:  08/22/21 (!) 178/80  07/28/21 (!) 186/86  05/23/21 (!) 172/86    Wt Readings from Last 3 Encounters:  08/22/21 125 lb 3.2 oz (56.8 kg)  07/28/21 124 lb (56.2 kg)  05/23/21 129 lb (58.5 kg)    Physical Exam Constitutional:      General: She is not in acute distress.    Appearance: Normal appearance. She is well-developed.  HENT:     Head: Normocephalic.     Right Ear: External ear normal.     Left Ear: External ear normal.     Nose: Nose normal.  Eyes:     General:        Right eye: No discharge.        Left eye: No discharge.     Conjunctiva/sclera: Conjunctivae normal.     Pupils: Pupils are equal, round, and reactive to light.  Neck:     Thyroid: No thyromegaly.     Vascular: No JVD.     Trachea: No tracheal deviation.  Cardiovascular:     Rate and Rhythm: Normal rate and regular rhythm.     Heart sounds: Normal heart sounds.  Pulmonary:     Effort: No respiratory distress.     Breath sounds: No stridor. No wheezing.  Abdominal:     General: Bowel sounds are normal. There is no distension.     Palpations: Abdomen is soft. There is no mass.     Tenderness: There is no abdominal tenderness. There is no guarding or rebound.  Musculoskeletal:        General: Tenderness present.     Cervical back: Normal range of motion and neck supple. No rigidity.  Lymphadenopathy:     Cervical: No cervical adenopathy.  Skin:    Findings: No erythema or rash.   Neurological:     Mental Status: She is oriented to person, place, and time.     Cranial Nerves: No cranial nerve deficit.     Motor: No abnormal muscle tone.     Coordination: Coordination  abnormal.     Gait: Gait abnormal.     Deep Tendon Reflexes: Reflexes normal.  Psychiatric:        Behavior: Behavior normal.        Thought Content: Thought content normal.        Judgment: Judgment normal.  A cold sore - L upper lip  Lab Results  Component Value Date   WBC 4.8 07/28/2021   HGB 11.0 (L) 07/28/2021   HCT 34.9 (L) 07/28/2021   PLT 131.0 (L) 07/28/2021   GLUCOSE 88 07/28/2021   CHOL 155 07/28/2021   TRIG 78.0 07/28/2021   HDL 56.60 07/28/2021   LDLCALC 83 07/28/2021   ALT 9 07/28/2021   AST 19 07/28/2021   NA 140 07/28/2021   K 4.2 07/28/2021   CL 103 07/28/2021   CREATININE 1.23 (H) 07/28/2021   BUN 32 (H) 07/28/2021   CO2 29 07/28/2021   TSH 0.85 07/28/2021   HGBA1C 6.3 07/28/2021    MM 3D SCREEN BREAST BILATERAL  Result Date: 11/22/2020 CLINICAL DATA:  Screening. EXAM: DIGITAL SCREENING BILATERAL MAMMOGRAM WITH TOMO AND CAD COMPARISON:  Previous exam(s). ACR Breast Density Category b: There are scattered areas of fibroglandular density. FINDINGS: There are no findings suspicious for malignancy. Images were processed with CAD. IMPRESSION: No mammographic evidence of malignancy. A result letter of this screening mammogram will be mailed directly to the patient. RECOMMENDATION: Screening mammogram in one year. (Code:SM-B-01Y) BI-RADS CATEGORY  1: Negative. Electronically Signed   By: Abelardo Diesel M.D.   On: 11/22/2020 12:48    Assessment & Plan:   There are no diagnoses linked to this encounter.   No orders of the defined types were placed in this encounter.    Follow-up: No follow-ups on file.  Walker Kehr, MD

## 2021-08-22 NOTE — Assessment & Plan Note (Signed)
SBP 140 at home

## 2021-08-22 NOTE — Assessment & Plan Note (Signed)
Cont w/BP treatment

## 2021-08-22 NOTE — Assessment & Plan Note (Signed)
Continue w/Coreg, Amlodipine, Losartan

## 2021-08-22 NOTE — Assessment & Plan Note (Signed)
No repeat fall Get a 4 prongs cane Rx

## 2021-08-22 NOTE — Assessment & Plan Note (Signed)
Use a cane 

## 2021-08-23 ENCOUNTER — Ambulatory Visit: Payer: Medicare PPO | Admitting: Internal Medicine

## 2021-08-25 DIAGNOSIS — M810 Age-related osteoporosis without current pathological fracture: Secondary | ICD-10-CM | POA: Diagnosis not present

## 2021-08-25 DIAGNOSIS — M109 Gout, unspecified: Secondary | ICD-10-CM | POA: Diagnosis not present

## 2021-08-25 DIAGNOSIS — E1140 Type 2 diabetes mellitus with diabetic neuropathy, unspecified: Secondary | ICD-10-CM | POA: Diagnosis not present

## 2021-08-25 DIAGNOSIS — E1121 Type 2 diabetes mellitus with diabetic nephropathy: Secondary | ICD-10-CM | POA: Diagnosis not present

## 2021-08-25 DIAGNOSIS — I272 Pulmonary hypertension, unspecified: Secondary | ICD-10-CM | POA: Diagnosis not present

## 2021-08-25 DIAGNOSIS — G20 Parkinson's disease: Secondary | ICD-10-CM | POA: Diagnosis not present

## 2021-08-25 DIAGNOSIS — M1990 Unspecified osteoarthritis, unspecified site: Secondary | ICD-10-CM | POA: Diagnosis not present

## 2021-08-25 DIAGNOSIS — I10 Essential (primary) hypertension: Secondary | ICD-10-CM | POA: Diagnosis not present

## 2021-08-25 DIAGNOSIS — I700 Atherosclerosis of aorta: Secondary | ICD-10-CM | POA: Diagnosis not present

## 2021-08-28 DIAGNOSIS — M810 Age-related osteoporosis without current pathological fracture: Secondary | ICD-10-CM | POA: Diagnosis not present

## 2021-08-28 DIAGNOSIS — I272 Pulmonary hypertension, unspecified: Secondary | ICD-10-CM | POA: Diagnosis not present

## 2021-08-28 DIAGNOSIS — E1121 Type 2 diabetes mellitus with diabetic nephropathy: Secondary | ICD-10-CM | POA: Diagnosis not present

## 2021-08-28 DIAGNOSIS — G20 Parkinson's disease: Secondary | ICD-10-CM | POA: Diagnosis not present

## 2021-08-28 DIAGNOSIS — I700 Atherosclerosis of aorta: Secondary | ICD-10-CM | POA: Diagnosis not present

## 2021-08-28 DIAGNOSIS — I10 Essential (primary) hypertension: Secondary | ICD-10-CM | POA: Diagnosis not present

## 2021-08-28 DIAGNOSIS — E1140 Type 2 diabetes mellitus with diabetic neuropathy, unspecified: Secondary | ICD-10-CM | POA: Diagnosis not present

## 2021-08-28 DIAGNOSIS — M1990 Unspecified osteoarthritis, unspecified site: Secondary | ICD-10-CM | POA: Diagnosis not present

## 2021-08-28 DIAGNOSIS — M109 Gout, unspecified: Secondary | ICD-10-CM | POA: Diagnosis not present

## 2021-09-01 DIAGNOSIS — I700 Atherosclerosis of aorta: Secondary | ICD-10-CM | POA: Diagnosis not present

## 2021-09-01 DIAGNOSIS — E1140 Type 2 diabetes mellitus with diabetic neuropathy, unspecified: Secondary | ICD-10-CM | POA: Diagnosis not present

## 2021-09-01 DIAGNOSIS — M109 Gout, unspecified: Secondary | ICD-10-CM | POA: Diagnosis not present

## 2021-09-01 DIAGNOSIS — G20 Parkinson's disease: Secondary | ICD-10-CM | POA: Diagnosis not present

## 2021-09-01 DIAGNOSIS — I272 Pulmonary hypertension, unspecified: Secondary | ICD-10-CM | POA: Diagnosis not present

## 2021-09-01 DIAGNOSIS — M810 Age-related osteoporosis without current pathological fracture: Secondary | ICD-10-CM | POA: Diagnosis not present

## 2021-09-01 DIAGNOSIS — I10 Essential (primary) hypertension: Secondary | ICD-10-CM | POA: Diagnosis not present

## 2021-09-01 DIAGNOSIS — E1121 Type 2 diabetes mellitus with diabetic nephropathy: Secondary | ICD-10-CM | POA: Diagnosis not present

## 2021-09-01 DIAGNOSIS — M1990 Unspecified osteoarthritis, unspecified site: Secondary | ICD-10-CM | POA: Diagnosis not present

## 2021-09-06 DIAGNOSIS — I700 Atherosclerosis of aorta: Secondary | ICD-10-CM | POA: Diagnosis not present

## 2021-09-06 DIAGNOSIS — I272 Pulmonary hypertension, unspecified: Secondary | ICD-10-CM | POA: Diagnosis not present

## 2021-09-06 DIAGNOSIS — E1140 Type 2 diabetes mellitus with diabetic neuropathy, unspecified: Secondary | ICD-10-CM | POA: Diagnosis not present

## 2021-09-06 DIAGNOSIS — G20 Parkinson's disease: Secondary | ICD-10-CM | POA: Diagnosis not present

## 2021-09-06 DIAGNOSIS — M1990 Unspecified osteoarthritis, unspecified site: Secondary | ICD-10-CM | POA: Diagnosis not present

## 2021-09-06 DIAGNOSIS — E1121 Type 2 diabetes mellitus with diabetic nephropathy: Secondary | ICD-10-CM | POA: Diagnosis not present

## 2021-09-06 DIAGNOSIS — M810 Age-related osteoporosis without current pathological fracture: Secondary | ICD-10-CM | POA: Diagnosis not present

## 2021-09-06 DIAGNOSIS — I10 Essential (primary) hypertension: Secondary | ICD-10-CM | POA: Diagnosis not present

## 2021-09-06 DIAGNOSIS — M109 Gout, unspecified: Secondary | ICD-10-CM | POA: Diagnosis not present

## 2021-09-12 ENCOUNTER — Telehealth: Payer: Self-pay | Admitting: *Deleted

## 2021-09-12 NOTE — Telephone Encounter (Signed)
Patient is calling to cancel appointment on 09/22/22w/ Dr Cannon Kettle, will call back to reschedule.

## 2021-09-14 ENCOUNTER — Ambulatory Visit: Payer: Medicare PPO | Admitting: Sports Medicine

## 2021-09-15 DIAGNOSIS — I10 Essential (primary) hypertension: Secondary | ICD-10-CM | POA: Diagnosis not present

## 2021-09-15 DIAGNOSIS — E1140 Type 2 diabetes mellitus with diabetic neuropathy, unspecified: Secondary | ICD-10-CM | POA: Diagnosis not present

## 2021-09-15 DIAGNOSIS — M1990 Unspecified osteoarthritis, unspecified site: Secondary | ICD-10-CM | POA: Diagnosis not present

## 2021-09-15 DIAGNOSIS — I272 Pulmonary hypertension, unspecified: Secondary | ICD-10-CM | POA: Diagnosis not present

## 2021-09-15 DIAGNOSIS — M810 Age-related osteoporosis without current pathological fracture: Secondary | ICD-10-CM | POA: Diagnosis not present

## 2021-09-15 DIAGNOSIS — G20 Parkinson's disease: Secondary | ICD-10-CM | POA: Diagnosis not present

## 2021-09-15 DIAGNOSIS — E1121 Type 2 diabetes mellitus with diabetic nephropathy: Secondary | ICD-10-CM | POA: Diagnosis not present

## 2021-09-15 DIAGNOSIS — I700 Atherosclerosis of aorta: Secondary | ICD-10-CM | POA: Diagnosis not present

## 2021-09-15 DIAGNOSIS — M109 Gout, unspecified: Secondary | ICD-10-CM | POA: Diagnosis not present

## 2021-09-20 DIAGNOSIS — Z947 Corneal transplant status: Secondary | ICD-10-CM | POA: Diagnosis not present

## 2021-09-20 DIAGNOSIS — H401133 Primary open-angle glaucoma, bilateral, severe stage: Secondary | ICD-10-CM | POA: Diagnosis not present

## 2021-09-28 DIAGNOSIS — E1121 Type 2 diabetes mellitus with diabetic nephropathy: Secondary | ICD-10-CM | POA: Diagnosis not present

## 2021-09-28 DIAGNOSIS — M109 Gout, unspecified: Secondary | ICD-10-CM | POA: Diagnosis not present

## 2021-09-28 DIAGNOSIS — I700 Atherosclerosis of aorta: Secondary | ICD-10-CM | POA: Diagnosis not present

## 2021-09-28 DIAGNOSIS — I10 Essential (primary) hypertension: Secondary | ICD-10-CM | POA: Diagnosis not present

## 2021-09-28 DIAGNOSIS — G20 Parkinson's disease: Secondary | ICD-10-CM | POA: Diagnosis not present

## 2021-09-28 DIAGNOSIS — I272 Pulmonary hypertension, unspecified: Secondary | ICD-10-CM | POA: Diagnosis not present

## 2021-09-28 DIAGNOSIS — E1140 Type 2 diabetes mellitus with diabetic neuropathy, unspecified: Secondary | ICD-10-CM | POA: Diagnosis not present

## 2021-09-28 DIAGNOSIS — M1990 Unspecified osteoarthritis, unspecified site: Secondary | ICD-10-CM | POA: Diagnosis not present

## 2021-09-28 DIAGNOSIS — M810 Age-related osteoporosis without current pathological fracture: Secondary | ICD-10-CM | POA: Diagnosis not present

## 2021-09-29 DIAGNOSIS — L989 Disorder of the skin and subcutaneous tissue, unspecified: Secondary | ICD-10-CM | POA: Diagnosis not present

## 2021-09-29 DIAGNOSIS — D485 Neoplasm of uncertain behavior of skin: Secondary | ICD-10-CM | POA: Diagnosis not present

## 2021-10-04 ENCOUNTER — Other Ambulatory Visit: Payer: Self-pay | Admitting: Internal Medicine

## 2021-10-04 DIAGNOSIS — Z1231 Encounter for screening mammogram for malignant neoplasm of breast: Secondary | ICD-10-CM

## 2021-10-04 DIAGNOSIS — D485 Neoplasm of uncertain behavior of skin: Secondary | ICD-10-CM | POA: Diagnosis not present

## 2021-10-11 DIAGNOSIS — M1990 Unspecified osteoarthritis, unspecified site: Secondary | ICD-10-CM | POA: Diagnosis not present

## 2021-10-11 DIAGNOSIS — E1140 Type 2 diabetes mellitus with diabetic neuropathy, unspecified: Secondary | ICD-10-CM | POA: Diagnosis not present

## 2021-10-11 DIAGNOSIS — I700 Atherosclerosis of aorta: Secondary | ICD-10-CM | POA: Diagnosis not present

## 2021-10-11 DIAGNOSIS — M109 Gout, unspecified: Secondary | ICD-10-CM | POA: Diagnosis not present

## 2021-10-11 DIAGNOSIS — G20 Parkinson's disease: Secondary | ICD-10-CM | POA: Diagnosis not present

## 2021-10-11 DIAGNOSIS — I10 Essential (primary) hypertension: Secondary | ICD-10-CM | POA: Diagnosis not present

## 2021-10-11 DIAGNOSIS — I272 Pulmonary hypertension, unspecified: Secondary | ICD-10-CM | POA: Diagnosis not present

## 2021-10-11 DIAGNOSIS — M810 Age-related osteoporosis without current pathological fracture: Secondary | ICD-10-CM | POA: Diagnosis not present

## 2021-10-11 DIAGNOSIS — E1121 Type 2 diabetes mellitus with diabetic nephropathy: Secondary | ICD-10-CM | POA: Diagnosis not present

## 2021-10-30 DIAGNOSIS — L84 Corns and callosities: Secondary | ICD-10-CM | POA: Diagnosis not present

## 2021-11-03 ENCOUNTER — Ambulatory Visit: Payer: Medicare PPO

## 2021-11-03 ENCOUNTER — Other Ambulatory Visit: Payer: Self-pay

## 2021-11-03 ENCOUNTER — Ambulatory Visit (INDEPENDENT_AMBULATORY_CARE_PROVIDER_SITE_OTHER): Payer: Medicare PPO

## 2021-11-03 VITALS — BP 138/78 | HR 68 | Temp 98.0°F | Ht 60.0 in | Wt 120.0 lb

## 2021-11-03 DIAGNOSIS — Z0000 Encounter for general adult medical examination without abnormal findings: Secondary | ICD-10-CM | POA: Diagnosis not present

## 2021-11-03 NOTE — Progress Notes (Addendum)
Subjective:   Breanna [name] is a 87 y.o. female who presents for Medicare Annual (Subsequent) preventive examination.  Review of Systems     Cardiac Risk Factors include: advanced age (>77men, >48 women);diabetes mellitus;dyslipidemia;hypertension     Objective:    Today's Vitals   11/03/21 0916  BP: 138/78  Pulse: 68  Temp: 98 F (36.7 C)  SpO2: 98%  Weight: 120 lb (54.4 kg)  Height: 5' (1.524 m)   Body mass index is 23.44 kg/m.  Advanced Directives 11/03/2021 07/21/2020 07/20/2020 07/13/2020 07/10/2019 07/07/2018 05/15/2018  Does Patient Have a Medical Advance Directive? No No No Yes Yes No No  Type of Advance Directive - - - Living will;Healthcare Power of [place];Living will - -  Does patient want to make changes to medical advance directive? - - - No - Guardian declined - - -  Copy of [place] in Chart? - - - No - copy requested No - copy requested - -  Would patient like information on creating a medical advance directive? No - Patient declined No - Patient declined - - - No - Patient declined Yes (MAU/Ambulatory/Procedural Areas - Information given)  Pre-existing out of facility DNR order (yellow form or pink MOST form) - - - - - - -    Current Medications (verified) Outpatient Encounter Medications as of 11/03/2021  Medication Sig   acetaminophen (TYLENOL) 325 MG tablet Take 2 tablets (650 mg total) by mouth every 6 (six) hours as needed for moderate pain or fever.   Alcohol Swabs (TRUE COMFORT ALCOHOL PREP PADS) 70 % PADS Use as directed   allopurinol (ZYLOPRIM) 300 MG tablet TAKE 1 TABLET BY MOUTH EVERY DAY   amLODipine (NORVASC) 5 MG tablet TAKE 1 TABLET BY MOUTH EVERY DAY   Ascorbic Acid (VITAMIN C) 500 MG CAPS Take 1 capsule by mouth daily.   aspirin 81 MG EC tablet Take 1 tablet (81 mg total) by mouth daily. Swallow whole.   atropine 1 % ophthalmic solution atropine 1 % eye drops   Blood Glucose Calibration  (TRUE METRIX LEVEL 1) Low SOLN USE AS DIRECTED   calcium carbonate (OS-CAL) 600 MG tablet Take 1 tablet (600 mg total) by mouth daily.   carbidopa-levodopa (SINEMET IR) 10-100 MG tablet Take 1 tablet by mouth in the morning.   carvedilol (COREG) 25 MG tablet Take 1 tablet (25 mg total) by mouth 2 (two) times daily with a meal.   Cholecalciferol (VITAMIN D3) 25 MCG (1000 UT) CAPS Take 1 capsule (1,000 Units total) by mouth daily.   cyanocobalamin 1000 MCG tablet Take 1 tablet (1,000 mcg total) by mouth daily.   diclofenac sodium (VOLTAREN) 1 % GEL Apply 2 g topically 2 (two) times daily.   docusate sodium (COLACE) 100 MG capsule TAKE 1 CAPSULE BY MOUTH TWICE A DAY   dorzolamide-timolol (COSOPT) 22.3-6.8 MG/ML ophthalmic solution Place 1 drop into both eyes 2 (two) times daily.    feeding supplement, ENSURE ENLIVE, (ENSURE ENLIVE) LIQD Take 237 mLs by mouth daily at 2 PM.   feeding supplement, GLUCERNA SHAKE, (GLUCERNA SHAKE) LIQD Take 237 mLs by mouth 2 (two) times daily between meals.   gabapentin (NEURONTIN) 100 MG capsule TAKE 1 CAPSULE BY MOUTH THREE TIMES A DAY   glucose blood (GNP TRUE METRIX GLUCOSE STRIPS) test strip Use to check blood sugars twice a day   latanoprost (XALATAN) 0.005 % ophthalmic solution Place 1 drop into both eyes at bedtime.  lidocaine-prilocaine (EMLA) cream Apply 1 application topically as needed.   losartan (COZAAR) 50 MG tablet Take 1 tablet (50 mg total) by mouth daily.   metFORMIN (GLUCOPHAGE) 500 MG tablet TAKE 1 TABLET BY MOUTH EVERY DAY WITH BREAKFAST   pantoprazole (PROTONIX) 40 MG tablet TAKE 1 TABLET BY MOUTH EVERY DAY   senna (SENOKOT) 8.6 MG TABS tablet Take 1 tablet (8.6 mg total) by mouth 2 (two) times daily.   sertraline (ZOLOFT) 100 MG tablet TAKE 1 TABLET BY MOUTH EVERY DAY   spironolactone (ALDACTONE) 25 MG tablet 1 tab by mouth mon - wed - fri   TRUEplus Lancets 33G MISC USE AS DIRECTED TO CHECK BLOOD SUGARS TWICE A DAY   valACYclovir (VALTREX)  500 MG tablet Take 1 tablet (500 mg total) by mouth 2 (two) times daily.   NON FORMULARY Place 10 drops under the tongue daily as needed (Pain). CBD oil   polyethylene glycol (MIRALAX / GLYCOLAX) 17 g packet Take 17 g by mouth daily as needed for mild constipation. (Patient not taking: Reported on 11/03/2021)   No facility-administered encounter medications on file as of 11/03/2021.    Allergies (verified) Verapamil, Aspirin, Atenolol, Codeine, Codeine sulfate, Hydrochlorothiazide, Hydrocodone, Ibuprofen, Lisinopril, Onion, Shellfish allergy, Valsartan, Adhesive  [tape], and Other   History: Past Medical History:  Diagnosis Date   Adrenal adenoma 2006   Boils 2009   Cholelithiasis    asympt. w/normal HIDA 03/2010   CVA (cerebral infarction) 2010   Cerebellar   Diverticulitis    Esophageal spasm 2011   GERD (gastroesophageal reflux disease)    Gout    History of colon polyps    HTN (hypertension)    Hydronephrosis    LEFT/ Surgical intervention   Hyperlipidemia    LBP (low back pain)    Osteoarthritis    Pulmonary HTN (HCC)    Stress    Type II or unspecified type diabetes mellitus without mention of complication, not stated as uncontrolled    Past Surgical History:  Procedure Laterality Date   ABDOMINAL HYSTERECTOMY     APPENDECTOMY     2020   BACK SURGERY     BREAST BIOPSY     RIGHT   CATARACT EXTRACTION, BILATERAL     COLECTOMY  2006   Sigmoid   FOOT SURGERY     BILATERAL   HAMMER TOE SURGERY     INTRAMEDULLARY (IM) NAIL INTERTROCHANTERIC Right 07/21/2020   Procedure: INTRAMEDULLARY (IM) NAIL INTERTROCHANTRIC;  Surgeon: Renette Butters, MD;  Location: [facility];  Service: Orthopedics;  Laterality: Right;   ROTATOR CUFF REPAIR  2008   RIGHT   TOTAL KNEE ARTHROPLASTY  2003   LEFT   VARICOSE VEIN SURGERY     vein stripping/lower extremities   Family History  Problem Relation Age of Onset   Prostate cancer Father    Hypertension Father    Cancer Father         prostate   Heart disease Mother    Diabetes Mother    Diabetes Other        1st degree relative   Heart disease Other    Hypertension Other    Prostate cancer Maternal Uncle    Cancer Maternal Uncle        prostate   Breast cancer Daughter    Cancer Daughter        breast   Colon cancer Neg Hx    Social History   Socioeconomic History   Marital status: Widowed  Spouse name: Aryelle Figg   Number of children: 1   Years of education: Not on file   Highest education level: Not on file  Occupational History   Occupation: Retired    Fish farm manager: RETIRED  Tobacco Use   Smoking status: Never   Smokeless tobacco: Never  Vaping Use   Vaping Use: Never used  Substance and Sexual Activity   Alcohol use: No    Alcohol/week: 0.0 standard drinks   Drug use: No   Sexual activity: Not Currently  Other Topics Concern   Not on file  Social History Narrative   Not on file   Social Determinants of Health   Financial Resource Strain: Low Risk    Difficulty of Paying Living Expenses: Not hard at all  Food Insecurity: No Food Insecurity   Worried About Charity fundraiser in the Last Year: Never true   [place] in the Last Year: Never true  Transportation Needs: No Transportation Needs   Lack of Transportation (Medical): No   Lack of Transportation (Non-Medical): No  Physical Activity: Inactive   Days of Exercise per Week: 0 days   Minutes of Exercise per Session: 0 min  Stress: No Stress Concern Present   Feeling of Stress : Not at all  Social Connections: Moderately Isolated   Frequency of Communication with Friends and Family: Twice a week   Frequency of Social Gatherings with Friends and Family: Twice a week   Attends Religious Services: 1 to 4 times per year   Active Member of [employer] or Organizations: No   Attends Archivist Meetings: Never   Marital Status: Widowed    Tobacco Counseling Counseling given: Not Answered   Clinical Intake:  Pre-visit  preparation completed: Yes  Pain : No/denies pain     Nutritional Risks: None Diabetes: Yes CBG done?: No Did pt. bring in CBG monitor from home?: No  How often do you need to have someone help you when you read instructions, pamphlets, or other written materials from your doctor or pharmacy?: 1 - Never What is the last grade level you completed in school?: [employer]  Diabetic?yes Nutrition Risk Assessment:  Has the patient had any N/V/D within the last 2 months?  No  Does the patient have any non-healing wounds?  No  Has the patient had any unintentional weight loss or weight gain?  No   Diabetes:  Is the patient diabetic?  Yes  If diabetic, was a CBG obtained today?  No  Did the  bring in their glucometer from home?  No  How often do you monitor your CBG's? Patient does check BS  Financial Strains and Diabetes Management:  Are you having any financial strains with the device, your supplies or your medication? No .  Does the patient want to be seen by Chronic Care Management for management of their diabetes?  No  Would the patient like to be referred to a Nutritionist or for Diabetic Management?  No   Diabetic Exams:  Diabetic Eye Exam: Completed 09/2021 Diabetic Foot Exam: Overdue, Pt has been advised about the importance in completing this exam. Pt is scheduled for diabetic foot exam on next office visit .   Interpreter Needed?: No  Information entered by :: [place] of Daily Living In your present state of health, do you have any difficulty performing the following activities: 11/03/2021 07/28/2021  Hearing? N N  Vision? Y Y  Comment glacoma -  Difficulty concentrating  or making decisions? N N  Walking or climbing stairs? N N  Dressing or bathing? N N  Doing errands, shopping? N N  Preparing Food and eating ? N -  Using the Toilet? N -  In the past six months, have you accidently leaked urine? N -  Do you have problems with loss of bowel  control? N -  Managing your Medications? Y -  Managing your Finances? Y -  Housekeeping or managing your Housekeeping? Y -  Some recent data might be hidden    Patient Care Team: Plotnikov, Evie Lacks, MD as PCP - General (Internal Medicine) Carolan Clines, MD (Inactive) as Consulting Physician (Urology) Melina Schools, MD as Consulting Physician (Orthopedic Surgery) Teofilo Pod, MD as Referring Physician (Ophthalmology) Marilynne Halsted, MD as Referring Physician (Ophthalmology)  Indicate any recent Medical Services you may have received from other than [facility] providers in the past year (date may be approximate).     Assessment:   This is a routine wellness examination for [place].  Hearing/Vision screen Vision Screening - Comments:: Annual eye exams  Dietary issues and exercise activities discussed: Current Exercise Habits: The patient does not participate in regular exercise at present, Exercise limited by: orthopedic condition(s)   Goals Addressed             This Visit's Progress    I want to increase the amount of water I drink   On track    I fill my water bottle up at least twice daily and drink it. Continue to be socially engaged with family and friends. Enjoy life       Depression Screen PHQ 2/9 Scores 11/03/2021 11/03/2021 07/28/2021 07/13/2020 07/10/2019 07/07/2018 07/04/2017  PHQ - 2 Score 0 0 0 0 0 0 0  Exception Documentation - - - - - - -    Fall Risk Fall Risk  11/03/2021 07/28/2021 07/13/2020 07/10/2019 07/07/2018  Falls in the past year? 0 1 0 0 Yes  Comment - - - - -  Number falls in past yr: 1 0 0 1 1  Injury with Fall? 0 0 0 1 No  Risk for fall due to : Impaired balance/gait - No Fall Risks Impaired vision;Impaired mobility;Impaired balance/gait Impaired mobility;Impaired balance/gait  Follow up Falls evaluation completed - Falls evaluation completed - Education provided;Falls prevention discussed    FALL RISK PREVENTION PERTAINING TO THE  HOME:  Any stairs in or around the home? No  If so, are there any without handrails? No  Home free of loose throw rugs in walkways, pet beds, electrical cords, etc? Yes  Adequate lighting in your home to reduce risk of falls? Yes   ASSISTIVE DEVICES UTILIZED TO PREVENT FALLS:  Life alert? No  Use of a cane, walker or w/c? Yes  Grab bars in the bathroom? Yes  Shower chair or bench in shower? Yes  Elevated toilet seat or a handicapped toilet? Yes   TIMED UP AND GO:  Was the test performed? Yes .  Length of time to ambulate 10 feet: 8 sec.   Gait slow and steady with assistive device  Cognitive Function: Normal cognitive status assessed by direct observation by this Nurse Health Advisor. No abnormalities found.   MMSE - Mini Mental State Exam 07/07/2018 07/04/2017 05/30/2015  Not completed: - - Unable to complete  Orientation to time 5 5 -  Orientation to Place 5 5 -  Registration 3 3 -  Attention/ Calculation 4 4 -  Recall 1 2 -  Language- name 2 objects 2 2 -  Language- repeat 1 1 -  Language- follow 3 step command 3 3 -  Language- read & follow direction 1 1 -  Write a sentence 1 1 -  Copy design 1 1 -  Total score 27 28 -        Immunizations Immunization History  Administered Date(s) Administered   Fluad Quad(high Dose 65+) 09/16/2019, 10/20/2020   H1N1 01/06/2009   Influenza Split 10/01/2011, 09/08/2012   Influenza Whole 10/07/2009, 09/29/2010   Influenza, High Dose Seasonal PF 10/06/2013, 11/21/2016, 09/25/2017, 09/22/2018   Influenza-Unspecified 10/09/2014, 09/09/2015   PFIZER(Purple Top)SARS-COV-2 Vaccination 01/12/2020, 02/02/2020, 09/26/2020   Pneumococcal Conjugate-13 04/07/2014   Pneumococcal Polysaccharide-23 06/26/2007, 03/14/2018   Td 06/26/2007   Tdap 05/13/2018   Zoster, Live 10/04/2011    TDAP status: Up to date  Flu Vaccine status: Up to date  Pneumococcal vaccine status: Up to date  Covid-19 vaccine status: Completed vaccines  Qualifies  for Shingles Vaccine? Yes   Zostavax completed No   Shingrix Completed?: No.    Education has been provided regarding the importance of this vaccine. Patient has been advised to call insurance company to determine out of pocket expense if they have not yet received this vaccine. Advised may also receive vaccine at local pharmacy or Health Dept. Verbalized acceptance and understanding.  Screening Tests Health Maintenance  Topic Date Due   Zoster Vaccines- Shingrix (1 of 2) Never done   FOOT EXAM  07/08/2019   COVID-19 Vaccine (4 - Booster for Pfizer series) 11/21/2020   INFLUENZA VACCINE  07/24/2021   HEMOGLOBIN A1C  01/28/2022   OPHTHALMOLOGY EXAM  07/17/2022   TETANUS/TDAP  05/13/2028   Pneumonia Vaccine 22+ Years old  Completed   DEXA SCAN  Completed   HPV VACCINES  Aged Out    Health Maintenance  Health Maintenance Due  Topic Date Due   Zoster Vaccines- Shingrix (1 of 2) Never done   FOOT EXAM  07/08/2019   COVID-19 Vaccine (4 - Booster for Pfizer series) 11/21/2020   INFLUENZA VACCINE  07/24/2021    Colorectal cancer screening: No longer required.   Mammogram status: No longer required due to age.  Bone Density status: Completed 06/15/2015 declines new dexa . Results reflect: Bone density results: OSTEOPENIA. Repeat every 5 years.  Lung Cancer Screening: (Low Dose CT Chest recommended if Age 97-80 years, 30 pack-year currently smoking OR have quit w/in 15years.) does not qualify.   Lung Cancer Screening Referral: n/a  Additional Screening:  Hepatitis C Screening: does not qualify;   Vision Screening: Recommended annual ophthalmology exams for early detection of glaucoma and other disorders of the eye. Is the patient up to date with their annual eye exam?  Yes  Who is the provider or what is the name of the office in which the patient attends annual eye exams? Dr.Gigingak  If pt is not established with a provider, would they like to be referred to a provider to  establish care? No .   Dental Screening: Recommended annual dental exams for proper oral hygiene  Community Resource Referral / Chronic Care Management: CRR required this visit?  No   CCM required this visit?  No      Plan:     I have personally reviewed and noted the following in the patient's chart:   Medical and social history Use of alcohol, tobacco or illicit drugs  Current medications and supplements including opioid prescriptions.  Functional ability and status Nutritional  status Physical activity Advanced directives List of other physicians Hospitalizations, surgeries, and ER visits in previous 12 months Vitals Screenings to include cognitive, depression, and falls Referrals and appointments  In addition, I have reviewed and discussed with patient certain preventive protocols, quality metrics, and best practice recommendations. A written personalized care plan for preventive services as well as general preventive health recommendations were provided to patient.     Randel Pigg, LPN   [id number]   Nurse Notes: none   Medical screening examination/treatment/procedure(s) were performed by non-physician practitioner and as supervising physician I was immediately available for consultation/collaboration.  I agree with above. Lew Dawes, MD

## 2021-11-03 NOTE — Patient Instructions (Signed)
Breanna [name] , Thank you for taking time to come for your Medicare Wellness Visit. I appreciate your ongoing commitment to your health goals. Please review the following plan we discussed and let me know if I can assist you in the future.   Screening recommendations/referrals: Colonoscopy: no longer required  Mammogram: no longer required  Bone Density: 06/15/2015 Recommended yearly ophthalmology/optometry visit for glaucoma screening and checkup Recommended yearly dental visit for hygiene and checkup  Vaccinations: Influenza vaccine: due  Pneumococcal vaccine: completed  Tdap vaccine: 05/13/2017 Shingles vaccine: declined     Advanced directives: none   Conditions/risks identified: none   Next appointment: none    Preventive Care 85 Years and Older, Female Preventive care refers to lifestyle choices and visits with your health care provider that can promote health and wellness. What does preventive care include? A yearly physical exam. This is also called an annual well check. Dental exams once or twice a year. Routine eye exams. Ask your health care provider how often you should have your eyes checked. Personal lifestyle choices, including: Daily care of your teeth and gums. Regular physical activity. Eating a healthy diet. Avoiding tobacco and drug use. Limiting alcohol use. Practicing safe sex. Taking low-dose aspirin every day. Taking vitamin and mineral supplements as recommended by your health care provider. What happens during an annual well check? The services and screenings done by your health care provider during your annual well check will depend on your age, overall health, lifestyle risk factors, and family history of disease. Counseling  Your health care provider may ask you questions about your: Alcohol use. Tobacco use. Drug use. Emotional well-being. Home and relationship well-being. Sexual activity. Eating habits. History of falls. Memory and ability  to understand (cognition). Work and work Statistician. Reproductive health. Screening  You may have the following tests or measurements: Height, weight, and BMI. Blood pressure. Lipid and cholesterol levels. These may be checked every 5 years, or more frequently if you are over 85 years old. Skin check. Lung cancer screening. You may have this screening every year starting at age 85 if you have a 30-pack-year history of smoking and currently smoke or have quit within the past 15 years. Fecal occult blood test (FOBT) of the stool. You may have this test every year starting at age 85. Flexible sigmoidoscopy or colonoscopy. You may have a sigmoidoscopy every 5 years or a colonoscopy every 10 years starting at age 85. Hepatitis C blood test. Hepatitis B blood test. Sexually transmitted disease (STD) testing. Diabetes screening. This is done by checking your blood sugar (glucose) after you have not eaten for a while (fasting). You may have this done every 1-3 years. Bone density scan. This is done to screen for osteoporosis. You may have this done starting at age 85. Mammogram. This may be done every 1-2 years. Talk to your health care provider about how often you should have regular mammograms. Talk with your health care provider about your test results, treatment options, and if necessary, the need for more tests. Vaccines  Your health care provider may recommend certain vaccines, such as: Influenza vaccine. This is recommended every year. Tetanus, diphtheria, and acellular pertussis (Tdap, Td) vaccine. You may need a Td booster every 10 years. Zoster vaccine. You may need this after age 30. Pneumococcal 13-valent conjugate (PCV13) vaccine. One dose is recommended after age 74. Pneumococcal polysaccharide (PPSV23) vaccine. One dose is recommended after age 58. Talk to your health care provider about which screenings and vaccines  you need and how often you need them. This information is not  intended to replace advice given to you by your health care provider. Make sure you discuss any questions you have with your health care provider. Document Released: 01/06/2016 Document Revised: 08/29/2016 Document Reviewed: 10/11/2015 Elsevier Interactive Patient Education  2017 [place] Prevention in the Home Falls can cause injuries. They can happen to people of all ages. There are many things you can do to make your home safe and to help prevent falls. What can I do on the outside of my home? Regularly fix the edges of walkways and driveways and fix any cracks. Remove anything that might make you trip as you walk through a door, such as a raised step or threshold. Trim any bushes or trees on the path to your home. Use bright outdoor lighting. Clear any walking paths of anything that might make someone trip, such as rocks or tools. Regularly check to see if handrails are loose or broken. Make sure that both sides of any steps have handrails. Any raised decks and porches should have guardrails on the edges. Have any leaves, snow, or ice cleared regularly. Use sand or salt on walking paths during winter. Clean up any spills in your garage right away. This includes oil or grease spills. What can I do in the bathroom? Use night lights. Install grab bars by the toilet and in the tub and shower. Do not use towel bars as grab bars. Use non-skid mats or decals in the tub or shower. If you need to sit down in the shower, use a plastic, non-slip stool. Keep the floor dry. Clean up any water that spills on the floor as soon as it happens. Remove soap buildup in the tub or shower regularly. Attach bath mats securely with double-sided non-slip rug tape. Do not have throw rugs and other things on the floor that can make you trip. What can I do in the bedroom? Use night lights. Make sure that you have a light by your bed that is easy to reach. Do not use any sheets or blankets that are  too big for your bed. They should not hang down onto the floor. Have a firm chair that has side arms. You can use this for support while you get dressed. Do not have throw rugs and other things on the floor that can make you trip. What can I do in the kitchen? Clean up any spills right away. Avoid walking on wet floors. Keep items that you use a lot in easy-to-reach places. If you need to reach something above you, use a strong step stool that has a grab bar. Keep electrical cords out of the way. Do not use floor polish or wax that makes floors slippery. If you must use wax, use non-skid floor wax. Do not have throw rugs and other things on the floor that can make you trip. What can I do with my stairs? Do not leave any items on the stairs. Make sure that there are handrails on both sides of the stairs and use them. Fix handrails that are broken or loose. Make sure that handrails are as long as the stairways. Check any carpeting to make sure that it is firmly attached to the stairs. Fix any carpet that is loose or worn. Avoid having throw rugs at the top or bottom of the stairs. If you do have throw rugs, attach them to the floor with carpet tape. Make sure  that you have a light switch at the top of the stairs and the bottom of the stairs. If you do not have them, ask someone to add them for you. What else can I do to help prevent falls? Wear shoes that: Do not have high heels. Have rubber bottoms. Are comfortable and fit you well. Are closed at the toe. Do not wear sandals. If you use a stepladder: Make sure that it is fully opened. Do not climb a closed stepladder. Make sure that both sides of the stepladder are locked into place. Ask someone to hold it for you, if possible. Clearly mark and make sure that you can see: Any grab bars or handrails. First and last steps. Where the edge of each step is. Use tools that help you move around (mobility aids) if they are needed. These  include: Canes. Walkers. Scooters. Crutches. Turn on the lights when you go into a dark area. Replace any light bulbs as soon as they burn out. Set up your furniture so you have a clear path. Avoid moving your furniture around. If any of your floors are uneven, fix them. If there are any pets around you, be aware of where they are. Review your medicines with your doctor. Some medicines can make you feel dizzy. This can increase your chance of falling. Ask your doctor what other things that you can do to help prevent falls. This information is not intended to replace advice given to you by your health care provider. Make sure you discuss any questions you have with your health care provider. Document Released: 10/06/2009 Document Revised: 05/17/2016 Document Reviewed: 01/14/2015 Elsevier Interactive Patient Education  2017 [employer].

## 2021-11-04 ENCOUNTER — Other Ambulatory Visit: Payer: Self-pay | Admitting: Internal Medicine

## 2021-11-07 ENCOUNTER — Telehealth: Payer: Self-pay | Admitting: Internal Medicine

## 2021-11-07 NOTE — Telephone Encounter (Signed)
Pharmacy requesting verbal to refill gabapentin (NEURONTIN) 100 MG capsule.    Callback #- [phone]

## 2021-11-07 NOTE — Telephone Encounter (Signed)
Called pharmacy spoe w/ rep.. gave her verbal for Gabapentin. Inform they script was faxed on yesterday. She states with this one we would need to call in since its consider pain medication Will notate...Johny Chess

## 2021-11-20 ENCOUNTER — Encounter: Payer: Self-pay | Admitting: Podiatry

## 2021-11-20 ENCOUNTER — Other Ambulatory Visit: Payer: Self-pay

## 2021-11-20 ENCOUNTER — Ambulatory Visit: Payer: Medicare PPO | Admitting: Podiatry

## 2021-11-20 DIAGNOSIS — E119 Type 2 diabetes mellitus without complications: Secondary | ICD-10-CM | POA: Diagnosis not present

## 2021-11-20 DIAGNOSIS — L97319 Non-pressure chronic ulcer of right ankle with unspecified severity: Secondary | ICD-10-CM

## 2021-11-20 DIAGNOSIS — M2141 Flat foot [pes planus] (acquired), right foot: Secondary | ICD-10-CM | POA: Diagnosis not present

## 2021-11-20 DIAGNOSIS — M79675 Pain in left toe(s): Secondary | ICD-10-CM | POA: Diagnosis not present

## 2021-11-20 DIAGNOSIS — M79674 Pain in right toe(s): Secondary | ICD-10-CM | POA: Diagnosis not present

## 2021-11-20 DIAGNOSIS — M2142 Flat foot [pes planus] (acquired), left foot: Secondary | ICD-10-CM | POA: Diagnosis not present

## 2021-11-20 DIAGNOSIS — B351 Tinea unguium: Secondary | ICD-10-CM

## 2021-11-20 DIAGNOSIS — L97301 Non-pressure chronic ulcer of unspecified ankle limited to breakdown of skin: Secondary | ICD-10-CM | POA: Diagnosis not present

## 2021-11-20 DIAGNOSIS — E08622 Diabetes mellitus due to underlying condition with other skin ulcer: Secondary | ICD-10-CM | POA: Diagnosis not present

## 2021-11-20 NOTE — Progress Notes (Signed)
Subjective: Breanna [name] is a 87 y.o. female patient with history of diabetes who returns to office today complaining of long,mildly painful nails and callus to toes while ambulating in shoes; unable to trim.  Hoping to have nails trimmed today and here to receive her diabetic shoes. Also mentions a callus on the outside of her right heel that is painful.  Last A1c was 6.3 on 07/28/21. No other pedal complaints.  PCP: Lew Dawes MD   Patient Active Problem List   Diagnosis Date Noted   Parkinsonian features 05/23/2021   Atherosclerosis of aorta ([place]) 05/23/2021   Anemia 08/18/2020   Constipation 08/18/2020   Hip fracture requiring operative repair ([place]) 07/21/2020   Sinus bradycardia 07/21/2020   Hip pain 07/06/2020   Fall against object 12/25/2018   Head contusion 12/25/2018   Acute pain of right knee 09/28/2018   Well adult exam 05/13/2018   Cold sore 05/13/2018   Night sweats 04/04/2018   Breast symptom 10/30/2017   Diarrhea 08/21/2017   UTI (urinary tract infection) 08/21/2017   Lung nodule 08/21/2017   FTT (failure to thrive) in adult 08/21/2017   Shortness of breath 08/13/2017   Cough 08/13/2017   Fatigue 08/13/2017   Fever 08/13/2017   Blurry vision 08/13/2017   Shoulder pain, right 07/25/2017   Callus of foot 06/07/2017   Need for prophylactic vaccination and inoculation against influenza 11/21/2016   Trigger finger, acquired 11/12/2016   Meteorism 07/11/2016   Dark stools 01/10/2016   Osteoporosis 07/13/2015   Fall at home 01/13/2015   Cornea disorder 11/22/2014   Primary open angle glaucoma of both eyes, indeterminate stage 11/22/2014   Secondary corneal edema 10/14/2014   Swelling of left knee joint 04/07/2014   Grief 01/04/2014   Syncope 01/04/2014   Thoracic spine pain 10/22/2013   Hypernatremia 07/07/2013   Viral intestinal infection 03/25/2013   Dehydration, moderate 03/24/2013   Food poisoning 03/24/2013   Headache(784.0) 12/22/2012   URI  (upper respiratory infection) 10/21/2012   Diabetic neuropathy ([place]) 07/30/2012   Neck pain on right side 07/30/2012   Cerumen impaction 07/17/2012   Cystoid macular edema 06/19/2012   History of corneal transplant 06/19/2012   Weight loss 04/10/2012   Wrist pain, right 04/06/2011   PARESTHESIA 01/03/2011   Full incontinence of feces 01/01/2011   ABDOMINAL PAIN RIGHT UPPER QUADRANT 01/01/2011   LEG PAIN, LEFT 10/02/2010   History of cardioembolic cerebrovascular accident (CVA) 03/27/2010   GERD 03/27/2010   DIVERTICULOSIS-COLON 03/27/2010   CHOLELITHIASIS 03/27/2010   LOW BACK PAIN 09/07/2009   ANEMIA, IRON DEFICIENCY, HX OF 06/14/2009   DYSPHAGIA 05/16/2009   Dysphagia 05/16/2009   Dyslipidemia 11/06/2007   Hyperlipidemia 11/06/2007   Type 2 diabetes mellitus with renal manifestations, controlled ([place]) 07/29/2007   Gout 07/29/2007   Essential hypertension 07/29/2007   Hydronephrosis 07/29/2007   Osteoarthritis 07/29/2007   COLONIC POLYPS, HX OF 07/29/2007   DIVERTICULITIS, HX OF 07/29/2007   Current Outpatient Medications on File Prior to Visit  Medication Sig Dispense Refill   acetaminophen (TYLENOL) 325 MG tablet Take 2 tablets (650 mg total) by mouth every 6 (six) hours as needed for moderate pain or fever.     Alcohol Swabs (TRUE COMFORT ALCOHOL PREP PADS) 70 % PADS Use as directed 300 each 1   allopurinol (ZYLOPRIM) 300 MG tablet TAKE 1 TABLET BY MOUTH EVERY DAY 30 tablet 11   amLODipine (NORVASC) 5 MG tablet TAKE 1 TABLET BY MOUTH EVERY DAY 30 tablet 11  Ascorbic Acid (VITAMIN C) 500 MG CAPS Take 1 capsule by mouth daily. 90 capsule 3   aspirin 81 MG EC tablet Take 1 tablet (81 mg total) by mouth daily. Swallow whole. 90 tablet 3   atropine 1 % ophthalmic solution atropine 1 % eye drops     Blood Glucose Calibration (TRUE METRIX LEVEL 1) Low SOLN USE AS DIRECTED 3 each 3   calcium carbonate (OS-CAL) 600 MG tablet Take 1 tablet (600 mg total) by mouth daily. 90 tablet 3    carbidopa-levodopa (SINEMET IR) 10-100 MG tablet Take 1 tablet by mouth in the morning. 90 tablet 11   carvedilol (COREG) 25 MG tablet Take 1 tablet (25 mg total) by mouth 2 (two) times daily with a meal. 180 tablet 3   Cholecalciferol (VITAMIN D3) 25 MCG (1000 UT) CAPS Take 1 capsule (1,000 Units total) by mouth daily. 90 capsule 3   cyanocobalamin 1000 MCG tablet Take 1 tablet (1,000 mcg total) by mouth daily. 90 tablet 3   diclofenac sodium (VOLTAREN) 1 % GEL Apply 2 g topically 2 (two) times daily. 200 g 3   docusate sodium (COLACE) 100 MG capsule TAKE 1 CAPSULE BY MOUTH TWICE A DAY 60 capsule 11   dorzolamide-timolol (COSOPT) 22.3-6.8 MG/ML ophthalmic solution Place 1 drop into both eyes 2 (two) times daily.      feeding supplement, ENSURE ENLIVE, (ENSURE ENLIVE) LIQD Take 237 mLs by mouth daily at 2 PM. 237 mL 12   feeding supplement, GLUCERNA SHAKE, (GLUCERNA SHAKE) LIQD Take 237 mLs by mouth 2 (two) times daily between meals.  0   gabapentin (NEURONTIN) 100 MG capsule TAKE 1 CAPSULE BY MOUTH THREE TIMES A DAY (Patient taking differently: TAKE 1 CAPSULE BY MOUTH THREE TIMES A DAY) 90 capsule 0   glucose blood (GNP TRUE METRIX GLUCOSE STRIPS) test strip Use to check blood sugars twice a day 200 each 2   latanoprost (XALATAN) 0.005 % ophthalmic solution Place 1 drop into both eyes at bedtime.      lidocaine-prilocaine (EMLA) cream Apply 1 application topically as needed. 30 g 0   losartan (COZAAR) 50 MG tablet Take 1 tablet (50 mg total) by mouth daily. 90 tablet 3   metFORMIN (GLUCOPHAGE) 500 MG tablet TAKE 1 TABLET BY MOUTH EVERY DAY WITH BREAKFAST 30 tablet 11   NON FORMULARY Place 10 drops under the tongue daily as needed (Pain). CBD oil     pantoprazole (PROTONIX) 40 MG tablet TAKE 1 TABLET BY MOUTH EVERY DAY 30 tablet 11   polyethylene glycol (MIRALAX / GLYCOLAX) 17 g packet Take 17 g by mouth daily as needed for mild constipation. (Patient not taking: Reported on 11/03/2021) 14 each 0    senna (SENOKOT) 8.6 MG TABS tablet Take 1 tablet (8.6 mg total) by mouth 2 (two) times daily. 120 tablet 0   sertraline (ZOLOFT) 100 MG tablet TAKE 1 TABLET BY MOUTH EVERY DAY 30 tablet 11   spironolactone (ALDACTONE) 25 MG tablet 1 tab by mouth mon - wed - fri 90 tablet 3   TRUEplus Lancets 33G MISC USE AS DIRECTED TO CHECK BLOOD SUGARS TWICE A DAY 200 each 0   valACYclovir (VALTREX) 500 MG tablet Take 1 tablet (500 mg total) by mouth 2 (two) times daily. 14 tablet 1   No current facility-administered medications on file prior to visit.   Allergies  Allergen Reactions   Verapamil Shortness Of Breath    REACTION: SOB   Aspirin Itching  But tolerates low dose   Atenolol     REACTION: fatigue   Codeine Itching   Codeine Sulfate    Hydrochlorothiazide     REACTION: gout   Hydrocodone       side effects - hallucinations   Ibuprofen     Upset stomach w/high doses   Lisinopril     REACTION: cough   Onion Other (See Comments)    Dry mouth/ gets sores   Shellfish Allergy Swelling    Patient stated she does not eat shellfish, she swells up on different parts of the body   Valsartan Itching   Adhesive  [Tape] Rash   Other Rash      Objective: General: Patient is awake, alert, and oriented x 3 and in no acute distress.  Integument: Skin is warm, dry and supple bilateral. Nails are tender, long, thickened and dystrophic with subungual debris, consistent with onychomycosis, 1-5 on left and 1,3-5 on right. No signs of infection. Minimal callus at distal tuft at 3rd toes bilateral.  Reactive callus secondary to get plantar lateral right heel.  Upon debridement small area measuring 0.1 cm x 0.1 cm ulcer with fibrotic base noted. No erythema edema or purulence noted.  Neurovascular status unchanged since last visit. Trace edema at ankles R>L.  Musculoskeletal: No reproducible pain to right lateral foot at 5th met base but there is some pain at posterior heel on right at area of  reactive callus secondary to gait. Asymptomatic hammertoe and pes planus pedal deformities noted bilateral. Muscular strength 5/5 in all lower extremity muscular groups bilateral without pain on range of motion. Achilles intact on right.. No tenderness with calf compression bilateral.  Assessment and Plan: Problem List Items Addressed This Visit   None   -Examined patient. -Re-discussed and educated patient on diabetic foot care, especially with regards to the vascular, neurological and musculoskeletal systems.  -Mechanically debrided all nails 1-5 bilateral using sterile nail nipper and filed with dremel without incident  -Mechanically debrided callus at tips of third toes bilateral using a sterile 15 blade without incident at no additional charge Ulcer right heel limited to breakdown of skin.  -Debridement as below. -Dressed with neosporin, DSD. -No abx indicated.  -Discussed glucose control and proper protein-rich diet.  -Discussed if any worsening redness, pain, fever or chills to call or may need to report to the emergency room. Patient expressed understanding.   Procedure: Excisional Debridement of Wound Rationale: Removal of non-viable soft tissue from the wound to promote healing.  Anesthesia: none Pre-Debridement Wound Measurements: Overlaying hyperkeratosis Post-Debridement Wound Measurements: 0.1 cm x 0.1 cm x 0.1 cm  Type of Debridement: Sharp Excisional Tissue Removed: Non-viable soft tissue Depth of Debridement: subcutaneous tissue. Technique: Sharp excisional debridement to bleeding, viable wound base.  Dressing: Dry, sterile, compression dressing. Disposition: Patient tolerated procedure well. Patient to return in 2 week for follow-up.  No follow-ups on file.  -  Lorenda Peck, DPM

## 2021-11-23 ENCOUNTER — Other Ambulatory Visit: Payer: Self-pay | Admitting: Internal Medicine

## 2021-11-28 ENCOUNTER — Ambulatory Visit
Admission: RE | Admit: 2021-11-28 | Discharge: 2021-11-28 | Disposition: A | Discharge status: Home / Self Care | Payer: Medicare PPO | Source: Ambulatory Visit | Attending: Internal Medicine | Admitting: Internal Medicine

## 2021-11-28 DIAGNOSIS — Z1231 Encounter for screening mammogram for malignant neoplasm of breast: Secondary | ICD-10-CM

## 2021-11-29 ENCOUNTER — Other Ambulatory Visit: Payer: Self-pay

## 2021-11-29 ENCOUNTER — Encounter: Payer: Self-pay | Admitting: Internal Medicine

## 2021-11-29 ENCOUNTER — Ambulatory Visit: Payer: Medicare PPO | Admitting: Internal Medicine

## 2021-11-29 VITALS — BP 162/78 | HR 63 | Temp 98.2°F | Ht 64.0 in | Wt 124.7 lb

## 2021-11-29 DIAGNOSIS — M5440 Lumbago with sciatica, unspecified side: Secondary | ICD-10-CM

## 2021-11-29 DIAGNOSIS — H547 Unspecified visual loss: Secondary | ICD-10-CM | POA: Insufficient documentation

## 2021-11-29 DIAGNOSIS — W19XXXD Unspecified fall, subsequent encounter: Secondary | ICD-10-CM

## 2021-11-29 DIAGNOSIS — G8929 Other chronic pain: Secondary | ICD-10-CM

## 2021-11-29 DIAGNOSIS — Y92009 Unspecified place in unspecified non-institutional (private) residence as the place of occurrence of the external cause: Secondary | ICD-10-CM

## 2021-11-29 DIAGNOSIS — R296 Repeated falls: Secondary | ICD-10-CM | POA: Diagnosis not present

## 2021-11-29 DIAGNOSIS — E1121 Type 2 diabetes mellitus with diabetic nephropathy: Secondary | ICD-10-CM | POA: Diagnosis not present

## 2021-11-29 LAB — COMPREHENSIVE METABOLIC PANEL
ALT: 10 U/L (ref 0–35)
AST: 20 U/L (ref 0–37)
Albumin: 3.7 g/dL (ref 3.5–5.2)
Alkaline Phosphatase: 96 U/L (ref 39–117)
BUN: 22 mg/dL (ref 6–23)
CO2: 31 mEq/L (ref 19–32)
Calcium: 9.6 mg/dL (ref 8.4–10.5)
Chloride: 105 mEq/L (ref 96–112)
Creatinine, Ser: 0.93 mg/dL (ref 0.40–1.20)
GFR: 54.97 mL/min — ABNORMAL LOW (ref >60.00)
Glucose, Bld: 94 mg/dL (ref 70–99)
Potassium: 4.1 mEq/L (ref 3.5–5.1)
Sodium: 142 mEq/L (ref 135–145)
Total Bilirubin: 0.4 mg/dL (ref 0.2–1.2)
Total Protein: 7.4 g/dL (ref 6.0–8.3)

## 2021-11-29 LAB — CBC WITH DIFFERENTIAL/PLATELET
Basophils Absolute: 0 10*3/uL (ref 0.0–0.1)
Basophils Relative: 0.4 % (ref 0.0–3.0)
Eosinophils Absolute: 0.1 10*3/uL (ref 0.0–0.7)
Eosinophils Relative: 3 % (ref 0.0–5.0)
HCT: 35.3 % — ABNORMAL LOW (ref 36.0–46.0)
Hemoglobin: 11 g/dL — ABNORMAL LOW (ref 12.0–15.0)
Lymphocytes Relative: 24.5 % (ref 12.0–46.0)
Lymphs Abs: 1.2 10*3/uL (ref 0.7–4.0)
MCHC: 31.3 g/dL (ref 30.0–36.0)
MCV: 91.5 fl (ref 78.0–100.0)
Monocytes Absolute: 0.3 10*3/uL (ref 0.1–1.0)
Monocytes Relative: 7.2 % (ref 3.0–12.0)
Neutro Abs: 3.1 10*3/uL (ref 1.4–7.7)
Neutrophils Relative %: 64.9 % (ref 43.0–77.0)
Platelets: 133 10*3/uL — ABNORMAL LOW (ref 150.0–400.0)
RBC: 3.86 Mil/uL — ABNORMAL LOW (ref 3.87–5.11)
RDW: 16.7 % — ABNORMAL HIGH (ref 11.5–15.5)
WBC: 4.8 10*3/uL (ref 4.0–10.5)

## 2021-11-29 LAB — URINALYSIS, ROUTINE W REFLEX MICROSCOPIC
Bilirubin Urine: NEGATIVE
Hgb urine dipstick: NEGATIVE
Ketones, ur: NEGATIVE
Leukocytes,Ua: NEGATIVE
Nitrite: NEGATIVE
RBC / HPF: NONE SEEN (ref 0–?)
Specific Gravity, Urine: 1.02 (ref 1.000–1.030)
Total Protein, Urine: 100 — AB
Urine Glucose: NEGATIVE
Urobilinogen, UA: 0.2 (ref 0.0–1.0)
WBC, UA: NONE SEEN (ref 0–?)
pH: 7.5 (ref 5.0–8.0)

## 2021-11-29 LAB — HEMOGLOBIN A1C: Hgb A1c MFr Bld: 6.3 % (ref 4.6–6.5)

## 2021-11-29 NOTE — Assessment & Plan Note (Signed)
Recurrent Start PT/OT at home

## 2021-11-29 NOTE — Assessment & Plan Note (Addendum)
Chronic.  No change.  Start PT/OT at home

## 2021-11-29 NOTE — Patient Instructions (Signed)
[facility] [employer]  We can provide you with the tools, services and education you need to be an active participant in your community, regardless of your visual ability.  Access to [employer] can be produced in Braille for other agencies, groups and individuals. Our Communications Unit produces material in alternate format, primarily Braille for [employer]. Alternate formats may be produced if you have no other means of obtaining the material. The requested materials have a per page fee. The Communications Unit will follow all copyright laws, specifically Public Law 382-505, which allows the production of material in "specialized formats" (Braille, audio, or digital text which is exclusively for use by blind or other persons with disabilities) without obtaining copyright permission. Large print production is not included in [place] [phone]. The Unit will not produce textbooks, cookbooks, math, computer codes or foreign language Braille. Consumers will be referred to appropriate resources if the Unit is unable to fulfill requests.  Aids and Appliances The Aids and Appliances Unit sells small aids and appliances developed or adapted for people who are blind or visually impaired at a cost. Items for sale include: Sewing aids Watches (talking, Braille, large print) Kitchen aids (long [employer], large print timers) Low-vision aids (special sunglasses and reading glasses) These and other products are available for a fee, plus shipping and state tax. To request this service contact us: Phone: [phone] Fax: [phone]   Mailing Address [address], [place] [address] [place] We support the involvement of people who are Blind or Deaf-Blind in camping experiences at [facility]. The [place] is located near [facility] and is supported through the [employer] of [place]. Email the [employer] of [place] Phone:  [phone] ext. [phone]  Education and Clinical biochemist for [employer] ([employer]) provides information on our services at public events. We can also provide education and awareness to family members, caregivers, civic groups, businesses, and other service providers on topics related to vision loss, including:  Low-vision aids, talking aids, and other adaptive aids to assist people with vision loss Technology available to assist people with vision loss Travel techniques for sighted people assisting people with vision loss Handicapped Placard If we have a current eye report on file, or if you can provide a report documenting your legal blindness, we can verify your eligibility for a Handicapped Parking Placard. Once verified, you can get an application for a handicap placard from your local Education officer, museum for the Blind or [employer].  [employer] We can assist you in applying for audio books, books in Braille or large print books from the [employer] for the Blind and Physically Handicapped. A descriptive video service is also available.  [place], people with blindness are eligible for a free fishing license. With a current eye report, we can assist you in applying.  Support Groups In many communities groups have formed to provide support to people who have lost vision and to their family members. These groups focus on educational, recreational and social activities. We can provide contact information about these groups.  Tax Deduction With a current eye report documenting legal blindness, we can verify that you are eligible for a tax deduction based on blindness.  VIP (Visually Impaired Persons) Fishing Tournament The [employer], the largest gathering of blind and visually impaired people in [place], is held annually in October on the [place]. Participants from across the  State attend the event. The tournament is  sponsored by the [employer]. We can assist you with applying.  Advertising account executive, the Independent Living Rehabilitation Counselor and the Education officer, museum for the Blind offer individuals the opportunity to complete a Materials engineer Form and offer the [employer] Registration Application/Update Form at application for service, recertification, at [employer] of service, change of address or name.  Contact To request our services, contact the [employer] nearest to you, or the [place]. Phone: [phone] or [phone]

## 2021-11-29 NOTE — Assessment & Plan Note (Addendum)
Worse.  Options discussed.  Handicap form filled out They will contact [place]

## 2021-11-29 NOTE — Assessment & Plan Note (Addendum)
Worse again.  Start PT/OT at home

## 2021-11-29 NOTE — Progress Notes (Signed)
Subjective:  Patient ID: Breanna [name], female    DOB: 10/25/1931  Age: 87 y.o. MRN: [id number]  CC: Follow-up (3 MONTH F/U) and Fall (Pt states she fell on last Sat She was bending over picking up something, and lost her balance. Hurt her (L) foot. - son states mom would like PT again)   HPI [facility] presents for  DM, vision loss - worse Pt fell once on the L side of the head while tying her shoes a few days ago. No LOC.  Outpatient Medications Prior to Visit  Medication Sig Dispense Refill   acetaminophen (TYLENOL) 325 MG tablet Take 2 tablets (650 mg total) by mouth every 6 (six) hours as needed for moderate pain or fever.     Alcohol Swabs (TRUE COMFORT ALCOHOL PREP PADS) 70 % PADS Use as directed 300 each 1   allopurinol (ZYLOPRIM) 300 MG tablet TAKE 1 TABLET BY MOUTH EVERY DAY 30 tablet 11   amLODipine (NORVASC) 5 MG tablet TAKE 1 TABLET BY MOUTH EVERY DAY 30 tablet 11   Ascorbic Acid (VITAMIN C) 500 MG CAPS Take 1 capsule by mouth daily. 90 capsule 3   aspirin 81 MG EC tablet Take 1 tablet (81 mg total) by mouth daily. Swallow whole. 90 tablet 3   atropine 1 % ophthalmic solution atropine 1 % eye drops     Blood Glucose Calibration (TRUE METRIX LEVEL 1) Low SOLN USE AS DIRECTED 3 each 3   calcium carbonate (OS-CAL) 600 MG tablet Take 1 tablet (600 mg total) by mouth daily. 90 tablet 3   carbidopa-levodopa (SINEMET IR) 10-100 MG tablet Take 1 tablet by mouth in the morning. 90 tablet 11   carvedilol (COREG) 25 MG tablet Take 1 tablet (25 mg total) by mouth 2 (two) times daily with a meal. 180 tablet 3   Cholecalciferol (VITAMIN D3) 25 MCG (1000 UT) CAPS Take 1 capsule (1,000 Units total) by mouth daily. 90 capsule 3   cyanocobalamin 1000 MCG tablet Take 1 tablet (1,000 mcg total) by mouth daily. 90 tablet 3   diclofenac sodium (VOLTAREN) 1 % GEL Apply 2 g topically 2 (two) times daily. 200 g 3   docusate sodium (COLACE) 100 MG capsule TAKE 1 CAPSULE BY MOUTH TWICE A DAY 60  capsule 11   dorzolamide-timolol (COSOPT) 22.3-6.8 MG/ML ophthalmic solution Place 1 drop into both eyes 2 (two) times daily.      feeding supplement, ENSURE ENLIVE, (ENSURE ENLIVE) LIQD Take 237 mLs by mouth daily at 2 PM. 237 mL 12   feeding supplement, GLUCERNA SHAKE, (GLUCERNA SHAKE) LIQD Take 237 mLs by mouth 2 (two) times daily between meals.  0   gabapentin (NEURONTIN) 100 MG capsule TAKE 1 CAPSULE BY MOUTH THREE TIMES A DAY (Patient taking differently: TAKE 1 CAPSULE BY MOUTH THREE TIMES A DAY) 90 capsule 0   glucose blood (GNP TRUE METRIX GLUCOSE STRIPS) test strip Use to check blood sugars twice a day 200 each 2   latanoprost (XALATAN) 0.005 % ophthalmic solution Place 1 drop into both eyes at bedtime.      lidocaine-prilocaine (EMLA) cream Apply 1 application topically as needed. 30 g 0   losartan (COZAAR) 50 MG tablet Take 1 tablet (50 mg total) by mouth daily. 90 tablet 3   metFORMIN (GLUCOPHAGE) 500 MG tablet TAKE 1 TABLET BY MOUTH EVERY DAY WITH BREAKFAST 30 tablet 11   NON FORMULARY Place 10 drops under the tongue daily as needed (Pain). CBD oil  pantoprazole (PROTONIX) 40 MG tablet TAKE 1 TABLET BY MOUTH EVERY DAY 30 tablet 11   polyethylene glycol (MIRALAX / GLYCOLAX) 17 g packet Take 17 g by mouth daily as needed for mild constipation. 14 each 0   senna (SENOKOT) 8.6 MG TABS tablet Take 1 tablet (8.6 mg total) by mouth 2 (two) times daily. 120 tablet 0   sertraline (ZOLOFT) 100 MG tablet TAKE 1 TABLET BY MOUTH EVERY DAY 30 tablet 11   spironolactone (ALDACTONE) 25 MG tablet 1 tab by mouth mon - wed - fri 90 tablet 3   TRUEplus Lancets 33G MISC USE AS DIRECTED TO CHECK BLOOD SUGAR TWICE A DAY 200 each 1   valACYclovir (VALTREX) 500 MG tablet Take 1 tablet (500 mg total) by mouth 2 (two) times daily. 14 tablet 1   No facility-administered medications prior to visit.    ROS: Review of Systems  Constitutional:  Positive for fatigue. Negative for activity change, appetite  change, chills and unexpected weight change.  HENT:  Negative for congestion, mouth sores and sinus pressure.   Eyes:  Positive for visual disturbance.  Respiratory:  Negative for cough and chest tightness.   Gastrointestinal:  Negative for abdominal pain and nausea.  Genitourinary:  Negative for difficulty urinating, frequency and vaginal pain.  Musculoskeletal:  Positive for arthralgias and gait problem. Negative for back pain.  Skin:  Negative for pallor, rash and wound.  Neurological:  Positive for dizziness and weakness. Negative for tremors, numbness and headaches.  Psychiatric/Behavioral:  Negative for confusion, decreased concentration, dysphoric mood and sleep disturbance. The patient is not nervous/anxious.    Objective:  BP (!) 162/78 (BP Location: Left Arm)   Pulse 63   Temp 98.2 F (36.8 C) (Oral)   Ht 5\' 4"  (1.626 m)   Wt 124 lb 11.2 oz (56.6 kg)   SpO2 98%   BMI 21.40 kg/m   BP Readings from Last 3 Encounters:  11/29/21 (!) 162/78  11/03/21 138/78  08/22/21 (!) 170/80    Wt Readings from Last 3 Encounters:  11/29/21 124 lb 11.2 oz (56.6 kg)  11/03/21 120 lb (54.4 kg)  08/22/21 125 lb 3.2 oz (56.8 kg)    Physical Exam Constitutional:      General: She is not in acute distress.    Appearance: She is well-developed. She is not toxic-appearing.  HENT:     Head: Normocephalic.     Right Ear: External ear normal.     Left Ear: External ear normal.     Nose: Nose normal.  Eyes:     General:        Right eye: No discharge.        Left eye: No discharge.     Conjunctiva/sclera: Conjunctivae normal.     Pupils: Pupils are equal, round, and reactive to light.  Neck:     Thyroid: No thyromegaly.     Vascular: No JVD.     Trachea: No tracheal deviation.  Cardiovascular:     Rate and Rhythm: Normal rate and regular rhythm.     Heart sounds: Normal heart sounds.  Pulmonary:     Effort: No respiratory distress.     Breath sounds: No stridor. No wheezing.   Abdominal:     General: Bowel sounds are normal. There is no distension.     Palpations: Abdomen is soft. There is no mass.     Tenderness: There is no abdominal tenderness. There is no guarding or rebound.  Musculoskeletal:  General: No tenderness.     Cervical back: Normal range of motion and neck supple. No rigidity.  Lymphadenopathy:     Cervical: No cervical adenopathy.  Skin:    Findings: No erythema or rash.  Neurological:     Cranial Nerves: No cranial nerve deficit.     Motor: Weakness present. No abnormal muscle tone.     Coordination: Coordination abnormal.     Gait: Gait abnormal.     Deep Tendon Reflexes: Reflexes normal.  Psychiatric:        Behavior: Behavior normal.        Thought Content: Thought content normal.        Judgment: Judgment normal.  ataxic  Lab Results  Component Value Date   WBC 4.8 11/29/2021   HGB 11.0 (L) 11/29/2021   HCT 35.3 (L) 11/29/2021   PLT 133.0 (L) 11/29/2021   GLUCOSE 94 11/29/2021   CHOL 155 07/28/2021   TRIG 78.0 07/28/2021   HDL 56.60 07/28/2021   LDLCALC 83 07/28/2021   ALT 10 11/29/2021   AST 20 11/29/2021   NA 142 11/29/2021   K 4.1 11/29/2021   CL 105 11/29/2021   CREATININE 0.93 11/29/2021   BUN 22 11/29/2021   CO2 31 11/29/2021   TSH 0.85 07/28/2021   HGBA1C 6.3 11/29/2021    MM 3D SCREEN BREAST BILATERAL  Result Date: 11/28/2021 CLINICAL DATA:  Screening. EXAM: DIGITAL SCREENING BILATERAL MAMMOGRAM WITH TOMOSYNTHESIS AND CAD TECHNIQUE: Bilateral screening digital craniocaudal and mediolateral oblique mammograms were obtained. Bilateral screening digital breast tomosynthesis was performed. The images were evaluated with computer-aided detection. COMPARISON:  Previous exam(s). ACR Breast Density Category b: There are scattered areas of fibroglandular density. FINDINGS: There are no findings suspicious for malignancy. IMPRESSION: No mammographic evidence of malignancy. A result letter of this screening  mammogram will be mailed directly to the patient. RECOMMENDATION: Screening mammogram in one year. (Code:SM-B-01Y) BI-RADS CATEGORY  1: Negative. Electronically Signed   By: Lovey Newcomer M.D.   On: 11/28/2021 16:37    Assessment & Plan:   Problem List Items Addressed This Visit     Fall at home    Recurrent Start PT/OT at home      Relevant Orders   Comprehensive metabolic panel (Completed)   CBC with Differential/Platelet (Completed)   Hemoglobin A1c (Completed)   Urinalysis   Falls frequently    Worse again.  Start PT/OT at home      Relevant Orders   Ambulatory referral to [place].  No change.  Start PT/OT at home      Relevant Orders   Ambulatory referral to [place]   Urinalysis   Type 2 diabetes mellitus with renal manifestations, controlled ([place]) - Primary    Stable.  Continue metformin.  Check A1c      Relevant Orders   Comprehensive metabolic panel (Completed)   CBC with Differential/Platelet (Completed)   Hemoglobin A1c (Completed)   Urinalysis   Vision loss    Worse.  Options discussed.  Handicap form filled out They will contact [place]         No orders of the defined types were placed in this encounter.     Follow-up: Return in about 3 months (around 02/27/2022) for a follow-up visit.  Walker Kehr, MD

## 2021-11-30 NOTE — Assessment & Plan Note (Signed)
Stable.  Continue metformin.  Check A1c

## 2021-12-07 ENCOUNTER — Telehealth: Payer: Self-pay | Admitting: Internal Medicine

## 2021-12-07 NOTE — Telephone Encounter (Signed)
Notified Gracee there was no answer LMOM ok for verbal. Pt is in good standing w/ appts.Marland KitchenJohny Chess

## 2021-12-07 NOTE — Telephone Encounter (Signed)
Gracee from [facility] has called to request verbal approval for PT. 1x week/ 1 week, 2x week/ 4 week, 1x week/ 1 week.   Pt had another fall, as she was reaching over for door. Did not have injuries.    Callback #- [phone]

## 2021-12-11 ENCOUNTER — Ambulatory Visit: Payer: Medicare PPO | Admitting: Internal Medicine

## 2021-12-26 DIAGNOSIS — H548 Legal blindness, as defined in USA: Secondary | ICD-10-CM | POA: Diagnosis not present

## 2021-12-26 DIAGNOSIS — K219 Gastro-esophageal reflux disease without esophagitis: Secondary | ICD-10-CM | POA: Diagnosis not present

## 2021-12-26 DIAGNOSIS — K08409 Partial loss of teeth, unspecified cause, unspecified class: Secondary | ICD-10-CM | POA: Diagnosis not present

## 2021-12-26 DIAGNOSIS — E1142 Type 2 diabetes mellitus with diabetic polyneuropathy: Secondary | ICD-10-CM | POA: Diagnosis not present

## 2021-12-26 DIAGNOSIS — I129 Hypertensive chronic kidney disease with stage 1 through stage 4 chronic kidney disease, or unspecified chronic kidney disease: Secondary | ICD-10-CM | POA: Diagnosis not present

## 2021-12-26 DIAGNOSIS — F325 Major depressive disorder, single episode, in full remission: Secondary | ICD-10-CM | POA: Diagnosis not present

## 2021-12-26 DIAGNOSIS — I2510 Atherosclerotic heart disease of native coronary artery without angina pectoris: Secondary | ICD-10-CM | POA: Diagnosis not present

## 2021-12-26 DIAGNOSIS — G8929 Other chronic pain: Secondary | ICD-10-CM | POA: Diagnosis not present

## 2021-12-26 DIAGNOSIS — E1122 Type 2 diabetes mellitus with diabetic chronic kidney disease: Secondary | ICD-10-CM | POA: Diagnosis not present

## 2022-01-01 DIAGNOSIS — Z7984 Long term (current) use of oral hypoglycemic drugs: Secondary | ICD-10-CM

## 2022-01-01 DIAGNOSIS — H6120 Impacted cerumen, unspecified ear: Secondary | ICD-10-CM

## 2022-01-01 DIAGNOSIS — K5900 Constipation, unspecified: Secondary | ICD-10-CM

## 2022-01-01 DIAGNOSIS — H547 Unspecified visual loss: Secondary | ICD-10-CM

## 2022-01-01 DIAGNOSIS — Z86010 Personal history of colonic polyps: Secondary | ICD-10-CM

## 2022-01-01 DIAGNOSIS — E1121 Type 2 diabetes mellitus with diabetic nephropathy: Secondary | ICD-10-CM

## 2022-01-01 DIAGNOSIS — R1310 Dysphagia, unspecified: Secondary | ICD-10-CM

## 2022-01-01 DIAGNOSIS — K219 Gastro-esophageal reflux disease without esophagitis: Secondary | ICD-10-CM | POA: Diagnosis not present

## 2022-01-01 DIAGNOSIS — E1129 Type 2 diabetes mellitus with other diabetic kidney complication: Secondary | ICD-10-CM | POA: Diagnosis not present

## 2022-01-01 DIAGNOSIS — I700 Atherosclerosis of aorta: Secondary | ICD-10-CM | POA: Diagnosis not present

## 2022-01-01 DIAGNOSIS — E1140 Type 2 diabetes mellitus with diabetic neuropathy, unspecified: Secondary | ICD-10-CM | POA: Diagnosis not present

## 2022-01-01 DIAGNOSIS — M810 Age-related osteoporosis without current pathological fracture: Secondary | ICD-10-CM

## 2022-01-01 DIAGNOSIS — G8929 Other chronic pain: Secondary | ICD-10-CM | POA: Diagnosis not present

## 2022-01-01 DIAGNOSIS — Z8673 Personal history of transient ischemic attack (TIA), and cerebral infarction without residual deficits: Secondary | ICD-10-CM

## 2022-01-01 DIAGNOSIS — Z7982 Long term (current) use of aspirin: Secondary | ICD-10-CM

## 2022-01-01 DIAGNOSIS — K5730 Diverticulosis of large intestine without perforation or abscess without bleeding: Secondary | ICD-10-CM | POA: Diagnosis not present

## 2022-01-01 DIAGNOSIS — M109 Gout, unspecified: Secondary | ICD-10-CM | POA: Diagnosis not present

## 2022-01-01 DIAGNOSIS — M6530 Trigger finger, unspecified finger: Secondary | ICD-10-CM

## 2022-01-01 DIAGNOSIS — R911 Solitary pulmonary nodule: Secondary | ICD-10-CM

## 2022-01-01 DIAGNOSIS — M545 Low back pain, unspecified: Secondary | ICD-10-CM | POA: Diagnosis not present

## 2022-01-01 DIAGNOSIS — N1330 Unspecified hydronephrosis: Secondary | ICD-10-CM

## 2022-01-01 DIAGNOSIS — H18239 Secondary corneal edema, unspecified eye: Secondary | ICD-10-CM

## 2022-01-01 DIAGNOSIS — D649 Anemia, unspecified: Secondary | ICD-10-CM

## 2022-01-01 DIAGNOSIS — I10 Essential (primary) hypertension: Secondary | ICD-10-CM | POA: Diagnosis not present

## 2022-01-03 ENCOUNTER — Other Ambulatory Visit: Payer: Self-pay | Admitting: Internal Medicine

## 2022-01-09 ENCOUNTER — Telehealth: Payer: Self-pay | Admitting: Internal Medicine

## 2022-01-09 NOTE — Telephone Encounter (Signed)
Patient son Breanna [name] calling in  Breanna [name] says patient is experiencing hallucination episodes.. becoming more confused & also having trouble walking  Offered 1st available appt w/ provider 01/17/22.Breanna [name] declined.. says he was told by provider to "always relay message to provider 1st to see if patient can be worked in sooner than 1st available appt"  Please call Breanna [name] [phone]

## 2022-01-10 NOTE — Telephone Encounter (Signed)
Noted.  It could be related to her medications. Stop Sinemet.  Scheduled this appointment on 01/17/2022 -it may take a few days to clear the medicines out of the system. Thanks.

## 2022-01-29 ENCOUNTER — Other Ambulatory Visit: Payer: Self-pay

## 2022-01-29 ENCOUNTER — Ambulatory Visit: Payer: Medicare PPO | Admitting: Podiatry

## 2022-01-29 ENCOUNTER — Encounter: Payer: Self-pay | Admitting: Podiatry

## 2022-01-29 DIAGNOSIS — M79675 Pain in left toe(s): Secondary | ICD-10-CM | POA: Diagnosis not present

## 2022-01-29 DIAGNOSIS — M79674 Pain in right toe(s): Secondary | ICD-10-CM

## 2022-01-29 DIAGNOSIS — B351 Tinea unguium: Secondary | ICD-10-CM | POA: Diagnosis not present

## 2022-01-29 DIAGNOSIS — E119 Type 2 diabetes mellitus without complications: Secondary | ICD-10-CM

## 2022-01-29 NOTE — Progress Notes (Signed)
°  Subjective:  Patient ID: Breanna [name], female    DOB: [birth year],   MRN: [id number]  Chief Complaint  Patient presents with   Nail Problem    Thick painful toenails, 3 month follow up   Diabetes    88 y.o. female presents for concern of thickened elongated and painful nails that are difficult to trim. Requesting to have them trimmed today. Denies any burning or tingling in her feet. Patient is diabetic and last A1c was 6.3   . Denies any other pedal complaints. Denies n/v/f/c.   Past Medical History:  Diagnosis Date   Adrenal adenoma 2006   Boils 2009   Cholelithiasis    asympt. w/normal HIDA 03/2010   CVA (cerebral infarction) 2010   Cerebellar   Diverticulitis    Esophageal spasm 2011   GERD (gastroesophageal reflux disease)    Gout    History of colon polyps    HTN (hypertension)    Hydronephrosis    LEFT/ Surgical intervention   Hyperlipidemia    LBP (low back pain)    Osteoarthritis    Pulmonary HTN (HCC)    Stress    Type II or unspecified type diabetes mellitus without mention of complication, not stated as uncontrolled     Objective:  Physical Exam: Vascular: DP/PT pulses 2/4 bilateral. CFT <3 seconds. Normal hair growth on digits. No edema.  Skin. No lacerations or abrasions bilateral feet. Nails 1-5 are thickened discolored and elongated with subungual debris.  Hyperkeratotic lesion noted to the lateral right foot.  Musculoskeletal: MMT 5/5 bilateral lower extremities in DF, PF, Inversion and Eversion. Deceased ROM in DF of ankle joint.  Neurological: Sensation intact to light touch.   Assessment:   1. Diabetes mellitus without complication ([place])   2. Pain due to onychomycosis of toenails of both feet      Plan:  Patient was evaluated and treated and all questions answered. -Discussed and educated patient on diabetic foot care, especially with  regards to the vascular, neurological and musculoskeletal systems.  -Stressed the importance of good  glycemic control and the detriment of not  controlling glucose levels in relation to the foot. -Discussed supportive shoes at all times and checking feet regularly.  -Mechanically debrided all nails 1-5 bilateral using sterile nail nipper and filed with dremel without incident  -Answered all patient questions -Patient to return  in 3 months for at risk foot care -Patient advised to call the office if any problems or questions arise in the meantime.   Lorenda Peck, DPM

## 2022-02-02 ENCOUNTER — Other Ambulatory Visit: Payer: Self-pay | Admitting: Internal Medicine

## 2022-02-12 DIAGNOSIS — Z947 Corneal transplant status: Secondary | ICD-10-CM | POA: Diagnosis not present

## 2022-02-12 DIAGNOSIS — H401133 Primary open-angle glaucoma, bilateral, severe stage: Secondary | ICD-10-CM | POA: Diagnosis not present

## 2022-02-21 ENCOUNTER — Ambulatory Visit: Payer: Medicare PPO | Admitting: Podiatry

## 2022-03-04 ENCOUNTER — Other Ambulatory Visit: Payer: Self-pay | Admitting: Internal Medicine

## 2022-03-05 DIAGNOSIS — L84 Corns and callosities: Secondary | ICD-10-CM | POA: Diagnosis not present

## 2022-03-07 ENCOUNTER — Other Ambulatory Visit: Payer: Self-pay

## 2022-03-07 ENCOUNTER — Ambulatory Visit: Payer: Medicare PPO | Admitting: Internal Medicine

## 2022-03-07 ENCOUNTER — Encounter: Payer: Self-pay | Admitting: Internal Medicine

## 2022-03-07 VITALS — BP 138/72 | HR 59 | Temp 98.0°F | Ht 64.0 in | Wt 127.0 lb

## 2022-03-07 DIAGNOSIS — R634 Abnormal weight loss: Secondary | ICD-10-CM | POA: Diagnosis not present

## 2022-03-07 DIAGNOSIS — M5440 Lumbago with sciatica, unspecified side: Secondary | ICD-10-CM

## 2022-03-07 DIAGNOSIS — I10 Essential (primary) hypertension: Secondary | ICD-10-CM | POA: Diagnosis not present

## 2022-03-07 DIAGNOSIS — E1142 Type 2 diabetes mellitus with diabetic polyneuropathy: Secondary | ICD-10-CM | POA: Diagnosis not present

## 2022-03-07 DIAGNOSIS — G8929 Other chronic pain: Secondary | ICD-10-CM | POA: Diagnosis not present

## 2022-03-07 DIAGNOSIS — R269 Unspecified abnormalities of gait and mobility: Secondary | ICD-10-CM | POA: Insufficient documentation

## 2022-03-07 DIAGNOSIS — E1121 Type 2 diabetes mellitus with diabetic nephropathy: Secondary | ICD-10-CM | POA: Diagnosis not present

## 2022-03-07 DIAGNOSIS — R259 Unspecified abnormal involuntary movements: Secondary | ICD-10-CM

## 2022-03-07 LAB — COMPREHENSIVE METABOLIC PANEL
ALT: 9 U/L (ref 0–35)
AST: 19 U/L (ref 0–37)
Albumin: 3.8 g/dL (ref 3.5–5.2)
Alkaline Phosphatase: 94 U/L (ref 39–117)
BUN: 29 mg/dL — ABNORMAL HIGH (ref 6–23)
CO2: 28 mEq/L (ref 19–32)
Calcium: 9.8 mg/dL (ref 8.4–10.5)
Chloride: 109 mEq/L (ref 96–112)
Creatinine, Ser: 1.15 mg/dL (ref 0.40–1.20)
GFR: 42.53 mL/min — ABNORMAL LOW (ref >60.00)
Glucose, Bld: 94 mg/dL (ref 70–99)
Potassium: 4.2 mEq/L (ref 3.5–5.1)
Sodium: 143 mEq/L (ref 135–145)
Total Bilirubin: 0.3 mg/dL (ref 0.2–1.2)
Total Protein: 6.9 g/dL (ref 6.0–8.3)

## 2022-03-07 LAB — CBC WITH DIFFERENTIAL/PLATELET
Basophils Absolute: 0 10*3/uL (ref 0.0–0.1)
Basophils Relative: 0.4 % (ref 0.0–3.0)
Eosinophils Absolute: 0.2 10*3/uL (ref 0.0–0.7)
Eosinophils Relative: 3.4 % (ref 0.0–5.0)
HCT: 34.6 % — ABNORMAL LOW (ref 36.0–46.0)
Hemoglobin: 11 g/dL — ABNORMAL LOW (ref 12.0–15.0)
Lymphocytes Relative: 24.1 % (ref 12.0–46.0)
Lymphs Abs: 1.2 10*3/uL (ref 0.7–4.0)
MCHC: 31.8 g/dL (ref 30.0–36.0)
MCV: 91.6 fl (ref 78.0–100.0)
Monocytes Absolute: 0.4 10*3/uL (ref 0.1–1.0)
Monocytes Relative: 8.4 % (ref 3.0–12.0)
Neutro Abs: 3.1 10*3/uL (ref 1.4–7.7)
Neutrophils Relative %: 63.7 % (ref 43.0–77.0)
Platelets: 130 10*3/uL — ABNORMAL LOW (ref 150.0–400.0)
RBC: 3.78 Mil/uL — ABNORMAL LOW (ref 3.87–5.11)
RDW: 16.3 % — ABNORMAL HIGH (ref 11.5–15.5)
WBC: 4.9 10*3/uL (ref 4.0–10.5)

## 2022-03-07 LAB — HEMOGLOBIN A1C: Hgb A1c MFr Bld: 6.3 % (ref 4.6–6.5)

## 2022-03-07 LAB — TSH: TSH: 0.79 u[IU]/mL (ref 0.35–5.50)

## 2022-03-07 NOTE — Assessment & Plan Note (Signed)
Start PT 

## 2022-03-07 NOTE — Assessment & Plan Note (Signed)
Start PT ?Remote fall  ?

## 2022-03-07 NOTE — Progress Notes (Signed)
? ?Subjective:  ?Patient ID: Breanna [name], female    DOB: [birth year]  Age: 88 y.o. MRN: [id number] ? ?CC: No chief complaint on file. ? ? ?HPI ?[facility] presents for gait disorder, weakness, HTN ?Seen w/son [name] in presence ? ?Outpatient Medications Prior to Visit  ?Medication Sig Dispense Refill  ? acetaminophen (TYLENOL) 325 MG tablet Take 2 tablets (650 mg total) by mouth every 6 (six) hours as needed for moderate pain or fever.    ? Alcohol Swabs (TRUE COMFORT ALCOHOL PREP PADS) 70 % PADS Use as directed 300 each 1  ? allopurinol (ZYLOPRIM) 300 MG tablet TAKE 1 TABLET BY MOUTH DAILY 30 tablet 11  ? amLODipine (NORVASC) 5 MG tablet TAKE 1 TABLET BY MOUTH DAILY 30 tablet 11  ? ascorbic acid (VITAMIN C) 500 MG tablet TAKE 1 TABLET BY MOUTH EVERY DAY 30 tablet 11  ? ASPIRIN LOW DOSE 81 MG EC tablet TAKE 1 TABLET BY MOUTH DAILY. SWALLOW WHOLE. 30 tablet 11  ? atropine 1 % ophthalmic solution atropine 1 % eye drops    ? Blood Glucose Calibration (TRUE METRIX LEVEL 1) Low SOLN USE AS DIRECTED 3 each 3  ? calcium carbonate (OS-CAL) 600 MG tablet Take 1 tablet (600 mg total) by mouth daily. 90 tablet 3  ? calcium carbonate (OSCAL) 1500 (600 Ca) MG TABS tablet TAKE 1 TABLET BY MOUTH EVERY DAY 30 tablet 11  ? carbidopa-levodopa (SINEMET IR) 10-100 MG tablet Take 1 tablet by mouth in the morning. 90 tablet 11  ? carvedilol (COREG) 25 MG tablet Take 1 tablet (25 mg total) by mouth 2 (two) times daily with a meal. 180 tablet 3  ? Cholecalciferol (VITAMIN D3) 25 MCG (1000 UT) CAPS Take 1 capsule (1,000 Units total) by mouth daily. 90 capsule 3  ? cyanocobalamin 1000 MCG tablet Take 1 tablet (1,000 mcg total) by mouth daily. 90 tablet 3  ? diclofenac sodium (VOLTAREN) 1 % GEL Apply 2 g topically 2 (two) times daily. 200 g 3  ? docusate sodium (COLACE) 100 MG capsule TAKE 1 CAPSULE BY MOUTH TWICE A DAY 60 capsule 11  ? dorzolamide-timolol (COSOPT) 22.3-6.8 MG/ML ophthalmic solution Place 1 drop into both eyes 2 (two)  times daily.     ? feeding supplement, ENSURE ENLIVE, (ENSURE ENLIVE) LIQD Take 237 mLs by mouth daily at 2 PM. 237 mL 12  ? feeding supplement, GLUCERNA SHAKE, (GLUCERNA SHAKE) LIQD Take 237 mLs by mouth 2 (two) times daily between meals.  0  ? gabapentin (NEURONTIN) 100 MG capsule TAKE 1 CAPSULE BY MOUTH THREE TIMES A DAY NEED APPT 90 capsule 3  ? glucose blood (GNP TRUE METRIX GLUCOSE STRIPS) test strip Use to check blood sugars twice a day 200 each 2  ? latanoprost (XALATAN) 0.005 % ophthalmic solution Place 1 drop into both eyes at bedtime.     ? lidocaine-prilocaine (EMLA) cream Apply 1 application topically as needed. 30 g 0  ? losartan (COZAAR) 50 MG tablet Take 1 tablet (50 mg total) by mouth daily. 90 tablet 3  ? metFORMIN (GLUCOPHAGE) 500 MG tablet TAKE 1 TABLET BY MOUTH EVERY DAY WITH BREAKFAST 30 tablet 1  ? NON FORMULARY Place 10 drops under the tongue daily as needed (Pain). CBD oil    ? pantoprazole (PROTONIX) 40 MG tablet TAKE 1 TABLET BY MOUTH DAILY 30 tablet 11  ? polyethylene glycol (MIRALAX / GLYCOLAX) 17 g packet Take 17 g by mouth daily as needed for mild constipation.  14 each 0  ? senna (SENOKOT) 8.6 MG TABS tablet Take 1 tablet (8.6 mg total) by mouth 2 (two) times daily. 120 tablet 0  ? sertraline (ZOLOFT) 100 MG tablet TAKE 1 TABLET BY MOUTH DAILY 30 tablet 11  ? spironolactone (ALDACTONE) 25 MG tablet 1 tab by mouth mon - wed - fri 90 tablet 3  ? TRUEplus Lancets 33G MISC USE AS DIRECTED TO CHECK BLOOD SUGAR TWICE A DAY 200 each 1  ? valACYclovir (VALTREX) 500 MG tablet Take 1 tablet (500 mg total) by mouth 2 (two) times daily. 14 tablet 1  ? ?No facility-administered medications prior to visit.  ? ? ?ROS: ?Review of Systems  ?Constitutional:  Negative for activity change, appetite change, chills, fatigue and unexpected weight change.  ?HENT:  Negative for congestion, mouth sores and sinus pressure.   ?Eyes:  Negative for visual disturbance.  ?Respiratory:  Negative for cough and chest  tightness.   ?Gastrointestinal:  Negative for abdominal pain and nausea.  ?Genitourinary:  Negative for difficulty urinating, frequency and vaginal pain.  ?Musculoskeletal:  Positive for arthralgias, back pain and gait problem.  ?Skin:  Negative for pallor and rash.  ?Neurological:  Negative for dizziness, tremors, weakness, numbness and headaches.  ?Psychiatric/Behavioral:  Negative for confusion and sleep disturbance.   ? ?Objective:  ?BP 138/72 (BP Location: Left Arm, Patient Position: Sitting, Cuff Size: Large)   Pulse (!) 59   Temp 98 ?F (36.7 ?C) (Oral)   Ht '5\' 4"'$  (1.626 m)   Wt 127 lb (57.6 kg)   SpO2 98%   BMI 21.80 kg/m?  ? ?BP Readings from Last 3 Encounters:  ?03/07/22 138/72  ?11/29/21 (!) 162/78  ?11/03/21 138/78  ? ? ?Wt Readings from Last 3 Encounters:  ?03/07/22 127 lb (57.6 kg)  ?11/29/21 124 lb 11.2 oz (56.6 kg)  ?11/03/21 120 lb (54.4 kg)  ? ? ?Physical Exam ?Constitutional:   ?   General: She is not in acute distress. ?   Appearance: Normal appearance. She is well-developed.  ?HENT:  ?   Head: Normocephalic.  ?   Right Ear: External ear normal.  ?   Left Ear: External ear normal.  ?   Nose: Nose normal.  ?Eyes:  ?   General:     ?   Right eye: No discharge.     ?   Left eye: No discharge.  ?   Conjunctiva/sclera: Conjunctivae normal.  ?   Pupils: Pupils are equal, round, and reactive to light.  ?Neck:  ?   Thyroid: No thyromegaly.  ?   Vascular: No JVD.  ?   Trachea: No tracheal deviation.  ?Cardiovascular:  ?   Rate and Rhythm: Normal rate and regular rhythm.  ?   Heart sounds: Normal heart sounds.  ?Pulmonary:  ?   Effort: No respiratory distress.  ?   Breath sounds: No stridor. No wheezing.  ?Abdominal:  ?   General: Bowel sounds are normal. There is no distension.  ?   Palpations: Abdomen is soft. There is no mass.  ?   Tenderness: There is no abdominal tenderness. There is no guarding or rebound.  ?Musculoskeletal:     ?   General: Tenderness present.  ?   Cervical back: Normal range  of motion and neck supple. No rigidity.  ?Lymphadenopathy:  ?   Cervical: No cervical adenopathy.  ?Skin: ?   Findings: No erythema or rash.  ?Neurological:  ?   Mental Status: She is oriented to  person, place, and time.  ?   Cranial Nerves: No cranial nerve deficit.  ?   Motor: Weakness present. No abnormal muscle tone.  ?   Coordination: Coordination abnormal.  ?   Gait: Gait abnormal.  ?   Deep Tendon Reflexes: Reflexes normal.  ?Psychiatric:     ?   Behavior: Behavior normal.     ?   Thought Content: Thought content normal.     ?   Judgment: Judgment normal.  ?Antalgic cane ?Using a cane ?Very slow ? ?Lab Results  ?Component Value Date  ? WBC 4.8 11/29/2021  ? HGB 11.0 (L) 11/29/2021  ? HCT 35.3 (L) 11/29/2021  ? PLT 133.0 (L) 11/29/2021  ? GLUCOSE 94 11/29/2021  ? CHOL 155 07/28/2021  ? TRIG 78.0 07/28/2021  ? HDL 56.60 07/28/2021  ? [place] 83 07/28/2021  ? ALT 10 11/29/2021  ? AST 20 11/29/2021  ? NA 142 11/29/2021  ? K 4.1 11/29/2021  ? CL 105 11/29/2021  ? CREATININE 0.93 11/29/2021  ? BUN 22 11/29/2021  ? CO2 31 11/29/2021  ? TSH 0.85 07/28/2021  ? HGBA1C 6.3 11/29/2021  ? ? ?MM 3D SCREEN BREAST BILATERAL ? ?Result Date: 11/28/2021 ?CLINICAL DATA:  Screening. EXAM: DIGITAL SCREENING BILATERAL MAMMOGRAM WITH TOMOSYNTHESIS AND CAD TECHNIQUE: Bilateral screening digital craniocaudal and mediolateral oblique mammograms were obtained. Bilateral screening digital breast tomosynthesis was performed. The images were evaluated with computer-aided detection. COMPARISON:  Previous exam(s). ACR Breast Density Category b: There are scattered areas of fibroglandular density. FINDINGS: There are no findings suspicious for malignancy. IMPRESSION: No mammographic evidence of malignancy. A result letter of this screening mammogram will be mailed directly to the patient. RECOMMENDATION: Screening mammogram in one year. (Code:SM-B-01Y) BI-RADS CATEGORY  1: Negative. Electronically Signed   By: Lovey Newcomer M.D.   On: 11/28/2021  16:37  ? ? ?Assessment & Plan:  ? ?Problem List Items Addressed This Visit   ? ? Diabetic neuropathy ([place]) - Primary  ? Relevant Orders  ? TSH  ? CBC with Differential/Platelet  ? Comprehensive metabolic panel

## 2022-03-07 NOTE — Assessment & Plan Note (Signed)
Cont w/Metformin 

## 2022-03-07 NOTE — Assessment & Plan Note (Signed)
Wt Readings from Last 3 Encounters:  ?03/07/22 127 lb (57.6 kg)  ?11/29/21 124 lb 11.2 oz (56.6 kg)  ?11/03/21 120 lb (54.4 kg)  ? ? ?

## 2022-03-07 NOTE — Assessment & Plan Note (Signed)
Start OP PT ?

## 2022-03-09 ENCOUNTER — Encounter: Payer: Self-pay | Admitting: Physical Therapy

## 2022-03-09 ENCOUNTER — Ambulatory Visit: Payer: Medicare PPO | Attending: Internal Medicine | Admitting: Physical Therapy

## 2022-03-09 ENCOUNTER — Other Ambulatory Visit: Payer: Self-pay

## 2022-03-09 DIAGNOSIS — R2689 Other abnormalities of gait and mobility: Secondary | ICD-10-CM | POA: Diagnosis not present

## 2022-03-09 DIAGNOSIS — M5440 Lumbago with sciatica, unspecified side: Secondary | ICD-10-CM | POA: Diagnosis not present

## 2022-03-09 DIAGNOSIS — R269 Unspecified abnormalities of gait and mobility: Secondary | ICD-10-CM | POA: Insufficient documentation

## 2022-03-09 DIAGNOSIS — G8929 Other chronic pain: Secondary | ICD-10-CM | POA: Diagnosis not present

## 2022-03-09 DIAGNOSIS — M5441 Lumbago with sciatica, right side: Secondary | ICD-10-CM | POA: Insufficient documentation

## 2022-03-09 DIAGNOSIS — M6281 Muscle weakness (generalized): Secondary | ICD-10-CM | POA: Insufficient documentation

## 2022-03-09 DIAGNOSIS — R2681 Unsteadiness on feet: Secondary | ICD-10-CM | POA: Diagnosis not present

## 2022-03-09 NOTE — Therapy (Signed)
?OUTPATIENT PHYSICAL THERAPY NEURO EVALUATION ? ? ?Patient Name: Breanna [name] ?MRN: [id number] ?DOB:July 18, 1931, 88 y.o., female ?Today's Date: 03/09/2022 ? ?PCP: Plotnikov, Evie Lacks, MD ?REFERRING PROVIDER: Cassandria Anger, MD  ? ? PT End of Session - 03/09/22 0940   ? ? Visit Number 1   ? Number of Visits 11   10+eval  ? Date for PT Re-Evaluation 05/18/22   ? Authorization Type [employer] MC 2023 VL: [place]   ? Progress Note Due on Visit 10   ? PT Start Time 0930   ? PT Stop Time 1016   ? PT Time Calculation (min) 46 min   ? Equipment Utilized During Treatment Gait belt   ? Activity Tolerance Patient tolerated treatment well   ? Behavior During Therapy [facility] for tasks assessed/performed   ? ?  ?  ? ?  ? ? ?Past Medical History:  ?Diagnosis Date  ? Adrenal adenoma 2006  ? Boils 2009  ? Cholelithiasis   ? asympt. w/normal HIDA 03/2010  ? CVA (cerebral infarction) 2010  ? Cerebellar  ? Diverticulitis   ? Esophageal spasm 2011  ? GERD (gastroesophageal reflux disease)   ? Gout   ? History of colon polyps   ? HTN (hypertension)   ? Hydronephrosis   ? LEFT/ Surgical intervention  ? Hyperlipidemia   ? LBP (low back pain)   ? Osteoarthritis   ? Pulmonary HTN ([place])   ? Stress   ? Type II or unspecified type diabetes mellitus without mention of complication, not stated as uncontrolled   ? ?Past Surgical History:  ?Procedure Laterality Date  ? ABDOMINAL HYSTERECTOMY    ? APPENDECTOMY    ? 2020  ? BACK SURGERY    ? BREAST BIOPSY    ? RIGHT  ? CATARACT EXTRACTION, BILATERAL    ? COLECTOMY  2006  ? Sigmoid  ? FOOT SURGERY    ? BILATERAL  ? HAMMER TOE SURGERY    ? INTRAMEDULLARY (IM) NAIL INTERTROCHANTERIC Right 07/21/2020  ? Procedure: INTRAMEDULLARY (IM) NAIL INTERTROCHANTRIC;  Surgeon: Renette Butters, MD;  Location: [place];  Service: Orthopedics;  Laterality: Right;  ? ROTATOR CUFF REPAIR  2008  ? RIGHT  ? TOTAL KNEE ARTHROPLASTY  2003  ? LEFT  ? VARICOSE VEIN SURGERY    ? vein stripping/lower extremities  ? ?Patient Active  Problem List  ? Diagnosis Date Noted  ? Gait disorder 03/07/2022  ? Falls frequently 11/29/2021  ? Vision loss 11/29/2021  ? Parkinsonian features 05/23/2021  ? Atherosclerosis of aorta ([place]) 05/23/2021  ? Anemia 08/18/2020  ? Constipation 08/18/2020  ? Hip fracture requiring operative repair ([place]) 07/21/2020  ? Sinus bradycardia 07/21/2020  ? Hip pain 07/06/2020  ? Fall against object 12/25/2018  ? Head contusion 12/25/2018  ? Acute pain of right knee 09/28/2018  ? Well adult exam 05/13/2018  ? Cold sore 05/13/2018  ? Night sweats 04/04/2018  ? Breast symptom 10/30/2017  ? Diarrhea 08/21/2017  ? UTI (urinary tract infection) 08/21/2017  ? Lung nodule 08/21/2017  ? FTT (failure to thrive) in adult 08/21/2017  ? Shortness of breath 08/13/2017  ? Cough 08/13/2017  ? Fatigue 08/13/2017  ? Fever 08/13/2017  ? Blurry vision 08/13/2017  ? Shoulder pain, right 07/25/2017  ? Callus of foot 06/07/2017  ? Need for prophylactic vaccination and inoculation against influenza 11/21/2016  ? Trigger finger, acquired 11/12/2016  ? Meteorism 07/11/2016  ? Dark stools 01/10/2016  ? Osteoporosis 07/13/2015  ? Fall at home  01/13/2015  ? Cornea disorder 11/22/2014  ? Primary open angle glaucoma of both eyes, indeterminate stage 11/22/2014  ? Secondary corneal edema 10/14/2014  ? Swelling of left knee joint 04/07/2014  ? Grief 01/04/2014  ? Syncope 01/04/2014  ? Thoracic spine pain 10/22/2013  ? Hypernatremia 07/07/2013  ? Viral intestinal infection 03/25/2013  ? Dehydration, moderate 03/24/2013  ? Food poisoning 03/24/2013  ? JSHFWYOV(785.8) 12/22/2012  ? URI (upper respiratory infection) 10/21/2012  ? Diabetic neuropathy ([place]) 07/30/2012  ? Neck pain on right side 07/30/2012  ? Cerumen impaction 07/17/2012  ? Cystoid macular edema 06/19/2012  ? History of corneal transplant 06/19/2012  ? Weight loss 04/10/2012  ? Wrist pain, right 04/06/2011  ? PARESTHESIA 01/03/2011  ? Full incontinence of feces 01/01/2011  ? ABDOMINAL PAIN RIGHT UPPER  QUADRANT 01/01/2011  ? LEG PAIN, LEFT 10/02/2010  ? History of cardioembolic cerebrovascular accident (CVA) 03/27/2010  ? GERD 03/27/2010  ? DIVERTICULOSIS-COLON 03/27/2010  ? CHOLELITHIASIS 03/27/2010  ? LOW BACK PAIN 09/07/2009  ? ANEMIA, IRON DEFICIENCY, HX OF 06/14/2009  ? DYSPHAGIA 05/16/2009  ? Dysphagia 05/16/2009  ? Dyslipidemia 11/06/2007  ? Hyperlipidemia 11/06/2007  ? Type 2 diabetes mellitus with renal manifestations, controlled ([place]) 07/29/2007  ? Gout 07/29/2007  ? Essential hypertension 07/29/2007  ? Hydronephrosis 07/29/2007  ? Osteoarthritis 07/29/2007  ? COLONIC POLYPS, HX OF 07/29/2007  ? DIVERTICULITIS, HX OF 07/29/2007  ? ? ?ONSET DATE: 03/07/2022  ? ?REFERRING DIAG: M54.40,G89.29 (ICD-10-CM) - Chronic midline low back pain with sciatica, sciatica laterality unspecified R26.9 (ICD-10-CM) - Gait disorder  ? ?THERAPY DIAG:  ?Chronic midline low back pain with right-sided sciatica ? ?Other abnormalities of gait and mobility ? ?Unsteadiness on feet ? ?Muscle weakness (generalized) ? ?SUBJECTIVE:  ?                                                                                                                                                                                           ? ?SUBJECTIVE STATEMENT: ?She feels she is still recovering from a broken R hip on 07/21/2020 w/ intramedullary nail following a fall.  She feels weaker in her legs and has some LBP with balance issues.  Pt states she also has N/T in the R foot and backside of calf. ?Pt accompanied by: self and family member-youngest son [name]) ? ?PERTINENT HISTORY: PMH:  OA, R intramedullary nail 07/21/2020, L TKA 2003, R RTC repair 2008, cerebellar CVA 2010, HTN, DM2 ? ?PAIN:  ?Are you having pain? Yes: NPRS scale: 2/10 ?Pain location: RLE down back of hip and thigh ?Pain description: aching ?Aggravating factors: sitting too long ?Relieving factors: walking ? ?PRECAUTIONS:  Fall ? ?WEIGHT BEARING RESTRICTIONS No ? ?FALLS: Has patient fallen  in last 6 months? Yes, Number of falls: 3; last fall before Christmas ? ?LIVING ENVIRONMENT: ?Lives with: lives with their family-granddaughter lives there temporarily ?Lives in: House/apartment ?Stairs: Yes; External: 4 steps; on right going up, on left going up, and can reach both-backdoor is most frequent entrance, has to open storm door and cannot reach L rail at top due to opening storm door ?Has following equipment at home: Quad cane large base, Environmental consultant - 2 wheeled, Electronics engineer, and Grab bars ? ?PLOF:  "Depends on the day"-vision is an issue, mostly independent with intermittent assist due to sight ? ?PATIENT GOALS To get stronger, to not fall, to straighten up without hurting so bad ? ?OBJECTIVE:  ?BP at onset of session:  L arm in sitting 162/80, pt LOB just prior when removing coat, no fall, but pt was anxious. ?DIAGNOSTIC FINDINGS: No recent, relevant imaging or other findings. ? ?COGNITION: ?Overall cognitive status: Within functional limits for tasks assessed ?  ?SENSATION: ?R<L ? ?COORDINATION: ?Heel-to-shin:  WFL, dec ROM due to strength ? ?EDEMA:  ?None noted in bil LE/UE ? ?POSTURE: rounded shoulders, forward head, increased thoracic kyphosis, posterior pelvic tilt, and flexed trunk  ? ?LE ROM:    ? ?Active  Right ?03/09/2022 Left ?03/09/2022  ?Hip flexion [facility] WFL  ?Hip extension    ?Hip abduction [facility] WFL  ?Hip adduction " "  ?Hip internal rotation    ?Hip external rotation    ?Knee flexion [facility] WFL  ?Knee extension " "  ?Ankle dorsiflexion " "  ?Ankle plantarflexion " "  ?Ankle inversion    ?Ankle eversion    ? (Blank rows = not tested) ? ?MMT:   ? ?MMT Right ?03/09/2022 Left ?03/09/2022  ?Hip flexion 3-/5 3-/5  ?Hip extension    ?Hip abduction 3-/5 3-/5  ?Hip adduction 4-/5 4-/5  ?Hip internal rotation    ?Hip external rotation    ?Knee flexion 3-/5 3/5  ?Knee extension 4/5 4/5  ?Ankle dorsiflexion 4/5 4/5  ?Ankle plantarflexion 3+/5 4/5  ?Ankle inversion    ?Ankle eversion    ?(Blank rows = not  tested) ? ?TRANSFERS: ?Assistive device utilized: Quad cane large base -using R quad cane on L, states she has the correct one at home ?Sit to stand: CGA, Min A, and Mod A ?Stand to sit: CGA and Min A ?Chair to

## 2022-03-09 NOTE — Addendum Note (Signed)
Addended by: Elease Etienne B on: 03/09/2022 04:36 PM ? ? Modules accepted: Orders ? ?

## 2022-03-14 ENCOUNTER — Ambulatory Visit: Payer: Medicare PPO | Admitting: Physical Therapy

## 2022-03-14 ENCOUNTER — Encounter: Payer: Self-pay | Admitting: Physical Therapy

## 2022-03-14 ENCOUNTER — Other Ambulatory Visit: Payer: Self-pay

## 2022-03-14 DIAGNOSIS — M6281 Muscle weakness (generalized): Secondary | ICD-10-CM | POA: Diagnosis not present

## 2022-03-14 DIAGNOSIS — R2681 Unsteadiness on feet: Secondary | ICD-10-CM | POA: Diagnosis not present

## 2022-03-14 DIAGNOSIS — M5441 Lumbago with sciatica, right side: Secondary | ICD-10-CM | POA: Diagnosis not present

## 2022-03-14 DIAGNOSIS — R2689 Other abnormalities of gait and mobility: Secondary | ICD-10-CM

## 2022-03-14 DIAGNOSIS — M5440 Lumbago with sciatica, unspecified side: Secondary | ICD-10-CM | POA: Diagnosis not present

## 2022-03-14 DIAGNOSIS — G8929 Other chronic pain: Secondary | ICD-10-CM

## 2022-03-14 DIAGNOSIS — R269 Unspecified abnormalities of gait and mobility: Secondary | ICD-10-CM | POA: Diagnosis not present

## 2022-03-14 NOTE — Patient Instructions (Signed)
Access Code: [email or web address] ?URL: [email or web address] ?Date: 03/14/2022 ?Prepared by: Elease Etienne ? ?Exercises ?Lower Trunk Rotations - 1 x daily - 4 x weekly - 2 sets - 8 reps ?Supine March - 1 x daily - 4 x weekly - 2 sets - 10 reps ?Supine Bridge - 1 x daily - 5 x weekly - 1 sets - 8 reps ?Sit to Stand with Counter Support - 1 x daily - 7 x weekly - 1 sets - 10 reps ? ?

## 2022-03-14 NOTE — Therapy (Signed)
?OUTPATIENT PHYSICAL THERAPY TREATMENT NOTE ? ? ?Patient Name: Breanna [name] ?MRN: [id number] ?DOB:1931/01/13, 88 y.o., female ?Today's Date: 03/14/2022 ? ?PCP: Plotnikov, Evie Lacks, MD ?REFERRING PROVIDER: Cassandria Anger, MD ? ? PT End of Session - 03/14/22 1028   ? ? Visit Number 2   ? Number of Visits 11   10+eval  ? Date for PT Re-Evaluation 05/18/22   ? Authorization Type [employer] MC 2023 VL: [place]   ? Progress Note Due on Visit 10   ? PT Start Time 1020   ? PT Stop Time 1101   ? PT Time Calculation (min) 41 min   ? Equipment Utilized During Treatment Gait belt   ? Activity Tolerance Patient tolerated treatment well   ? Behavior During Therapy [facility] for tasks assessed/performed   ? ?  ?  ? ?  ? ? ?Past Medical History:  ?Diagnosis Date  ? Adrenal adenoma 2006  ? Boils 2009  ? Cholelithiasis   ? asympt. w/normal HIDA 03/2010  ? CVA (cerebral infarction) 2010  ? Cerebellar  ? Diverticulitis   ? Esophageal spasm 2011  ? GERD (gastroesophageal reflux disease)   ? Gout   ? History of colon polyps   ? HTN (hypertension)   ? Hydronephrosis   ? LEFT/ Surgical intervention  ? Hyperlipidemia   ? LBP (low back pain)   ? Osteoarthritis   ? Pulmonary HTN ([place])   ? Stress   ? Type II or unspecified type diabetes mellitus without mention of complication, not stated as uncontrolled   ? ?Past Surgical History:  ?Procedure Laterality Date  ? ABDOMINAL HYSTERECTOMY    ? APPENDECTOMY    ? 2020  ? BACK SURGERY    ? BREAST BIOPSY    ? RIGHT  ? CATARACT EXTRACTION, BILATERAL    ? COLECTOMY  2006  ? Sigmoid  ? FOOT SURGERY    ? BILATERAL  ? HAMMER TOE SURGERY    ? INTRAMEDULLARY (IM) NAIL INTERTROCHANTERIC Right 07/21/2020  ? Procedure: INTRAMEDULLARY (IM) NAIL INTERTROCHANTRIC;  Surgeon: Renette Butters, MD;  Location: [place];  Service: Orthopedics;  Laterality: Right;  ? ROTATOR CUFF REPAIR  2008  ? RIGHT  ? TOTAL KNEE ARTHROPLASTY  2003  ? LEFT  ? VARICOSE VEIN SURGERY    ? vein stripping/lower extremities  ? ?Patient Active Problem  List  ? Diagnosis Date Noted  ? Gait disorder 03/07/2022  ? Falls frequently 11/29/2021  ? Vision loss 11/29/2021  ? Parkinsonian features 05/23/2021  ? Atherosclerosis of aorta ([place]) 05/23/2021  ? Anemia 08/18/2020  ? Constipation 08/18/2020  ? Hip fracture requiring operative repair ([place]) 07/21/2020  ? Sinus bradycardia 07/21/2020  ? Hip pain 07/06/2020  ? Fall against object 12/25/2018  ? Head contusion 12/25/2018  ? Acute pain of right knee 09/28/2018  ? Well adult exam 05/13/2018  ? Cold sore 05/13/2018  ? Night sweats 04/04/2018  ? Breast symptom 10/30/2017  ? Diarrhea 08/21/2017  ? UTI (urinary tract infection) 08/21/2017  ? Lung nodule 08/21/2017  ? FTT (failure to thrive) in adult 08/21/2017  ? Shortness of breath 08/13/2017  ? Cough 08/13/2017  ? Fatigue 08/13/2017  ? Fever 08/13/2017  ? Blurry vision 08/13/2017  ? Shoulder pain, right 07/25/2017  ? Callus of foot 06/07/2017  ? Need for prophylactic vaccination and inoculation against influenza 11/21/2016  ? Trigger finger, acquired 11/12/2016  ? Meteorism 07/11/2016  ? Dark stools 01/10/2016  ? Osteoporosis 07/13/2015  ? Fall at home 01/13/2015  ?  Cornea disorder 11/22/2014  ? Primary open angle glaucoma of both eyes, indeterminate stage 11/22/2014  ? Secondary corneal edema 10/14/2014  ? Swelling of left knee joint 04/07/2014  ? Grief 01/04/2014  ? Syncope 01/04/2014  ? Thoracic spine pain 10/22/2013  ? Hypernatremia 07/07/2013  ? Viral intestinal infection 03/25/2013  ? Dehydration, moderate 03/24/2013  ? Food poisoning 03/24/2013  ? QHUTMLYY(503.5) 12/22/2012  ? URI (upper respiratory infection) 10/21/2012  ? Diabetic neuropathy ([place]) 07/30/2012  ? Neck pain on right side 07/30/2012  ? Cerumen impaction 07/17/2012  ? Cystoid macular edema 06/19/2012  ? History of corneal transplant 06/19/2012  ? Weight loss 04/10/2012  ? Wrist pain, right 04/06/2011  ? PARESTHESIA 01/03/2011  ? Full incontinence of feces 01/01/2011  ? ABDOMINAL PAIN RIGHT UPPER  QUADRANT 01/01/2011  ? LEG PAIN, LEFT 10/02/2010  ? History of cardioembolic cerebrovascular accident (CVA) 03/27/2010  ? GERD 03/27/2010  ? DIVERTICULOSIS-COLON 03/27/2010  ? CHOLELITHIASIS 03/27/2010  ? LOW BACK PAIN 09/07/2009  ? ANEMIA, IRON DEFICIENCY, HX OF 06/14/2009  ? DYSPHAGIA 05/16/2009  ? Dysphagia 05/16/2009  ? Dyslipidemia 11/06/2007  ? Hyperlipidemia 11/06/2007  ? Type 2 diabetes mellitus with renal manifestations, controlled ([place]) 07/29/2007  ? Gout 07/29/2007  ? Essential hypertension 07/29/2007  ? Hydronephrosis 07/29/2007  ? Osteoarthritis 07/29/2007  ? COLONIC POLYPS, HX OF 07/29/2007  ? DIVERTICULITIS, HX OF 07/29/2007  ? ? ?REFERRING DIAG: M54.40,G89.29 (ICD-10-CM) - Chronic midline low back pain with sciatica, sciatica laterality unspecified R26.9 (ICD-10-CM) - Gait disorder  ? ?THERAPY DIAG:  ?Chronic midline low back pain with right-sided sciatica ? ?Other abnormalities of gait and mobility ? ?Unsteadiness on feet ? ?Muscle weakness (generalized) ? ?PERTINENT HISTORY: OA, R intramedullary nail 07/21/2020, L TKA 2003, R RTC repair 2008, cerebellar CVA 2010, HTN, DM2 ? ?PRECAUTIONS: Fall ? ?SUBJECTIVE: "Everything has been just fine."  She reports she has been leaning onto R side without meaning to. ? ?PAIN:  ?Are you having pain? Yes: NPRS scale: 3/10 ?Pain location: RLE ?Pain description: "pressing against the metal" ? ?Assessed hamstring length bilateral 90 deg hip flexion w/ 0 deg knee extension-normal; no sciatic pain during passive nerve tension and glides in supine. ?Assessed TUG:  34.02 sec w/ R quad cane ?Assessed BERG: 17/56 = severe fall risk ?  ? [facility] PT Assessment - 03/14/22 1052   ? ?  ? Balance  ? Balance Assessed Yes   ?  ? Standardized Balance Assessment  ? Standardized Balance Assessment Berg Balance Test   ?  ? Berg Balance Test  ? Sit to Stand Needs minimal aid to stand or to stabilize   ? Standing Unsupported Able to stand 30 seconds unsupported   ? Sitting with Back  Unsupported but Feet Supported on Floor or Stool Able to sit 2 minutes under supervision   severe posterior lean, but is stable without use of hands  ? Stand to Sit Controls descent by using hands   ? Transfers Needs one person to assist   ? Standing Unsupported with Eyes Closed Able to stand 3 seconds   8 sec prior to posterior LOB  ? Standing Unsupported with Feet Together Needs help to attain position but able to stand for 30 seconds with feet together   ? From Standing, Reach Forward with Outstretched Arm Reaches forward but needs supervision   ? From Standing Position, Pick up Object from Floor Unable to pick up and needs supervision   ? From Standing Position, Turn to Look Behind Over  each Shoulder Turn sideways only but maintains balance   ? Turn 360 Degrees Needs assistance while turning   ? Standing Unsupported, Alternately Place Feet on Step/Stool Needs assistance to keep from falling or unable to try   ? Standing Unsupported, One Foot in [employer] balance while stepping or standing   ? Standing on One Leg Unable to try or needs assist to prevent fall   ? Total Score 17   ? ?  ?  ? ?  ? ? ?Initiated HEP:  see below.  Pain at R PSIS and iliac crest without palpable muscle spasm during core exercises. ? ? ?Access Code: [email or web address] ?URL: [email or web address] ?Date: 03/14/2022 ?Prepared by: Elease Etienne ? ?Exercises ?Lower Trunk Rotations - 1 x daily - 4 x weekly - 2 sets - 8 reps ?Supine March - 1 x daily - 4 x weekly - 2 sets - 10 reps ?Supine Bridge - 1 x daily - 5 x weekly - 1 sets - 8 reps ?Sit to Stand with Counter Support - 1 x daily - 7 x weekly - 1 sets - 10 reps ? ?OBJECTIVE:  ?BP at onset of session:  L arm in sitting 162/80, pt LOB just prior when removing coat, no fall, but pt was anxious. ?DIAGNOSTIC FINDINGS: No recent, relevant imaging or other findings. ?  ?COGNITION: ?Overall cognitive status: Within functional limits for tasks assessed ?            ?SENSATION: ?R<L ?   ?COORDINATION: ?Heel-to-shin:  WFL, dec ROM due to strength ?  ?EDEMA:  ?None noted in bil LE/UE ?  ?POSTURE: rounded shoulders, forward head, increased thoracic kyphosis, posterior pelvic tilt, and flexed trunk  ?  ?L

## 2022-03-21 ENCOUNTER — Other Ambulatory Visit: Payer: Self-pay

## 2022-03-21 ENCOUNTER — Ambulatory Visit: Payer: Medicare PPO | Admitting: Physical Therapy

## 2022-03-21 DIAGNOSIS — R2681 Unsteadiness on feet: Secondary | ICD-10-CM

## 2022-03-21 DIAGNOSIS — R2689 Other abnormalities of gait and mobility: Secondary | ICD-10-CM | POA: Diagnosis not present

## 2022-03-21 DIAGNOSIS — M6281 Muscle weakness (generalized): Secondary | ICD-10-CM

## 2022-03-21 DIAGNOSIS — R269 Unspecified abnormalities of gait and mobility: Secondary | ICD-10-CM | POA: Diagnosis not present

## 2022-03-21 DIAGNOSIS — G8929 Other chronic pain: Secondary | ICD-10-CM | POA: Diagnosis not present

## 2022-03-21 DIAGNOSIS — M5440 Lumbago with sciatica, unspecified side: Secondary | ICD-10-CM | POA: Diagnosis not present

## 2022-03-21 DIAGNOSIS — M5441 Lumbago with sciatica, right side: Secondary | ICD-10-CM | POA: Diagnosis not present

## 2022-03-21 NOTE — Therapy (Signed)
?OUTPATIENT PHYSICAL THERAPY TREATMENT NOTE ? ? ?Patient Name: Breanna [name] ?MRN: [id number] ?DOB:1931-01-03, 88 y.o., female ?Today's Date: 03/21/2022 ? ?PCP: Plotnikov, Evie Lacks, MD ?REFERRING PROVIDER: Cassandria Anger, MD ? ? PT End of Session - 03/21/22 1019   ? ? Visit Number 3   ? Number of Visits 11   10+eval  ? Date for PT Re-Evaluation 05/18/22   ? Authorization Type [employer] MC 2023 VL: [place]   ? Progress Note Due on Visit 10   ? PT Start Time 1016   ? PT Stop Time 1058   ? PT Time Calculation (min) 42 min   ? Equipment Utilized During Treatment Gait belt   ? Activity Tolerance Patient tolerated treatment well   ? Behavior During Therapy [facility] for tasks assessed/performed   ? ?  ?  ? ?  ? ? ? ?Past Medical History:  ?Diagnosis Date  ? Adrenal adenoma 2006  ? Boils 2009  ? Cholelithiasis   ? asympt. w/normal HIDA 03/2010  ? CVA (cerebral infarction) 2010  ? Cerebellar  ? Diverticulitis   ? Esophageal spasm 2011  ? GERD (gastroesophageal reflux disease)   ? Gout   ? History of colon polyps   ? HTN (hypertension)   ? Hydronephrosis   ? LEFT/ Surgical intervention  ? Hyperlipidemia   ? LBP (low back pain)   ? Osteoarthritis   ? Pulmonary HTN ([place])   ? Stress   ? Type II or unspecified type diabetes mellitus without mention of complication, not stated as uncontrolled   ? ?Past Surgical History:  ?Procedure Laterality Date  ? ABDOMINAL HYSTERECTOMY    ? APPENDECTOMY    ? 2020  ? BACK SURGERY    ? BREAST BIOPSY    ? RIGHT  ? CATARACT EXTRACTION, BILATERAL    ? COLECTOMY  2006  ? Sigmoid  ? FOOT SURGERY    ? BILATERAL  ? HAMMER TOE SURGERY    ? INTRAMEDULLARY (IM) NAIL INTERTROCHANTERIC Right 07/21/2020  ? Procedure: INTRAMEDULLARY (IM) NAIL INTERTROCHANTRIC;  Surgeon: Renette Butters, MD;  Location: [place];  Service: Orthopedics;  Laterality: Right;  ? ROTATOR CUFF REPAIR  2008  ? RIGHT  ? TOTAL KNEE ARTHROPLASTY  2003  ? LEFT  ? VARICOSE VEIN SURGERY    ? vein stripping/lower extremities  ? ?Patient Active Problem  List  ? Diagnosis Date Noted  ? Gait disorder 03/07/2022  ? Falls frequently 11/29/2021  ? Vision loss 11/29/2021  ? Parkinsonian features 05/23/2021  ? Atherosclerosis of aorta ([place]) 05/23/2021  ? Anemia 08/18/2020  ? Constipation 08/18/2020  ? Hip fracture requiring operative repair ([place]) 07/21/2020  ? Sinus bradycardia 07/21/2020  ? Hip pain 07/06/2020  ? Fall against object 12/25/2018  ? Head contusion 12/25/2018  ? Acute pain of right knee 09/28/2018  ? Well adult exam 05/13/2018  ? Cold sore 05/13/2018  ? Night sweats 04/04/2018  ? Breast symptom 10/30/2017  ? Diarrhea 08/21/2017  ? UTI (urinary tract infection) 08/21/2017  ? Lung nodule 08/21/2017  ? FTT (failure to thrive) in adult 08/21/2017  ? Shortness of breath 08/13/2017  ? Cough 08/13/2017  ? Fatigue 08/13/2017  ? Fever 08/13/2017  ? Blurry vision 08/13/2017  ? Shoulder pain, right 07/25/2017  ? Callus of foot 06/07/2017  ? Need for prophylactic vaccination and inoculation against influenza 11/21/2016  ? Trigger finger, acquired 11/12/2016  ? Meteorism 07/11/2016  ? Dark stools 01/10/2016  ? Osteoporosis 07/13/2015  ? Fall at home  01/13/2015  ? Cornea disorder 11/22/2014  ? Primary open angle glaucoma of both eyes, indeterminate stage 11/22/2014  ? Secondary corneal edema 10/14/2014  ? Swelling of left knee joint 04/07/2014  ? Grief 01/04/2014  ? Syncope 01/04/2014  ? Thoracic spine pain 10/22/2013  ? Hypernatremia 07/07/2013  ? Viral intestinal infection 03/25/2013  ? Dehydration, moderate 03/24/2013  ? Food poisoning 03/24/2013  ? WPVXYIAX(655.3) 12/22/2012  ? URI (upper respiratory infection) 10/21/2012  ? Diabetic neuropathy ([place]) 07/30/2012  ? Neck pain on right side 07/30/2012  ? Cerumen impaction 07/17/2012  ? Cystoid macular edema 06/19/2012  ? History of corneal transplant 06/19/2012  ? Weight loss 04/10/2012  ? Wrist pain, right 04/06/2011  ? PARESTHESIA 01/03/2011  ? Full incontinence of feces 01/01/2011  ? ABDOMINAL PAIN RIGHT UPPER  QUADRANT 01/01/2011  ? LEG PAIN, LEFT 10/02/2010  ? History of cardioembolic cerebrovascular accident (CVA) 03/27/2010  ? GERD 03/27/2010  ? DIVERTICULOSIS-COLON 03/27/2010  ? CHOLELITHIASIS 03/27/2010  ? LOW BACK PAIN 09/07/2009  ? ANEMIA, IRON DEFICIENCY, HX OF 06/14/2009  ? DYSPHAGIA 05/16/2009  ? Dysphagia 05/16/2009  ? Dyslipidemia 11/06/2007  ? Hyperlipidemia 11/06/2007  ? Type 2 diabetes mellitus with renal manifestations, controlled ([place]) 07/29/2007  ? Gout 07/29/2007  ? Essential hypertension 07/29/2007  ? Hydronephrosis 07/29/2007  ? Osteoarthritis 07/29/2007  ? COLONIC POLYPS, HX OF 07/29/2007  ? DIVERTICULITIS, HX OF 07/29/2007  ? ? ?REFERRING DIAG: M54.40,G89.29 (ICD-10-CM) - Chronic midline low back pain with sciatica, sciatica laterality unspecified R26.9 (ICD-10-CM) - Gait disorder  ? ?THERAPY DIAG:  ?Unsteadiness on feet ? ?Muscle weakness (generalized) ? ?Other abnormalities of gait and mobility ? ?PERTINENT HISTORY: OA, R intramedullary nail 07/21/2020, L TKA 2003, R RTC repair 2008, cerebellar CVA 2010, HTN, DM2 ? ?PRECAUTIONS: Fall, legally blind  ? ?SUBJECTIVE: "I am looking forward to being able to move better without pain". No new changes ? ? ?PAIN:  ?Are you having pain? Yes: NPRS scale: 2/10 ?Pain location: R hip and centralized low back ?Pain description: "pressing against the metal" ?  ? ? ?OBJECTIVE:  ? ?TODAYS TREATMENT ?Pt ambulated into and out of clinic with LBQD and HHA, noted significant forward lean, shuffling gait and pt holding cane backwards, mod verbal cues to correct position to reduce tripping hazard. Pt reports she is legally blind and cannot see the position of her cane. Provided HHA throughout session for safety.  ? ?Ther ex ?NuStep level 4 for 8 minutes with BUE/BLEs for dynamic cardiovascular warmup, UE/LE coordination and BLE strength. Mod verbal cues to maintain steps/min >40, but pt unable to see screen.   ? ?Added posterior pelvic tilts and supine bridges to HEP for  improved core stabilization and glute strength for back pain modulation. Pt required max tactile cues to properly perform each exercise but verbalized reduction in low back pain with each.   ? ? ?PATIENT EDUCATION: ?Education details: Addition to HEP, importance of using LBQD correctly to reduce trip hazard  ?Person educated: Patient ?Education method: Explanation and handout  ?Education comprehension: verbalized understanding and needs further education  ? ?HOME EXERCISE PROGRAM  ?Access Code: [email or web address] ?URL: [email or web address] ?Date: 03/21/2022 ?Prepared by: Mickie Bail Larene Ascencio ? ?Exercises ?- Lower Trunk Rotations  - 1 x daily - 4 x weekly - 2 sets - 8 reps ?- Supine March  - 1 x daily - 4 x weekly - 2 sets - 10 reps ?- Supine Bridge  - 1 x daily - 5 x weekly - 1 sets - 8 reps ?-  Sit to Stand with Counter Support  - 1 x daily - 7 x weekly - 1 sets - 10 reps ?- Posterior pelvic tilt  - 1 x daily - 7 x weekly - 3 sets - 10 reps ?- Supine Bridge  - 1 x daily - 7 x weekly - 3 sets - 10 reps ?  ?  ?GOALS: ?Goals reviewed with patient? Yes ?SHORT TERM GOALS:  Target date:  04/13/2022 ?1.  Pt will be independent with initial strength and balance HEP with supervision from family. ?Baseline:  To be initiated next visit ?                      Goal status:  INITIAL ?2.  TUG to be assessed next visit with appropriate goal set. ?Baseline: Assessed 03/14/2022 ?Goal status: INITIAL ?3. Pt will decrease 5xSTS to <40 seconds in order to demonstrate decreased risk for falls and improved functional bilateral LE strength and power. ?           Baseline:  43.28sec ?           Goal status:  INITIAL ?4.  Pt will ambulate >200' w/ LRAD without rest to demonstrate improved household ambulation. ?Baseline:  100' w/ quad cane and hand over hand support ?Goal status:  INITIAL ?5.  BERG to be assessed and STG/LTG to be set. ?Baseline:  Assessed 03/14/2022 ?Goal status:  INITIAL ?  ?LONG TERM GOALS: Target date: 05/18/2022 ?Pt will be  independent with finalized HEP for core and LE strength and balance with supervision from family. ?Baseline:  ?Goal status: INITIAL ?  ?2.  Pt will demonstrate TUG of <30 seconds in order to decrease risk o

## 2022-03-28 ENCOUNTER — Other Ambulatory Visit: Payer: Self-pay | Admitting: Internal Medicine

## 2022-03-28 ENCOUNTER — Encounter: Payer: Self-pay | Admitting: Physical Therapy

## 2022-03-28 ENCOUNTER — Ambulatory Visit: Payer: Medicare PPO | Attending: Internal Medicine | Admitting: Physical Therapy

## 2022-03-28 ENCOUNTER — Ambulatory Visit: Payer: Medicare PPO | Admitting: *Deleted

## 2022-03-28 DIAGNOSIS — M5441 Lumbago with sciatica, right side: Secondary | ICD-10-CM | POA: Insufficient documentation

## 2022-03-28 DIAGNOSIS — I10 Essential (primary) hypertension: Secondary | ICD-10-CM

## 2022-03-28 DIAGNOSIS — G8929 Other chronic pain: Secondary | ICD-10-CM | POA: Diagnosis not present

## 2022-03-28 DIAGNOSIS — R2689 Other abnormalities of gait and mobility: Secondary | ICD-10-CM | POA: Diagnosis not present

## 2022-03-28 DIAGNOSIS — R293 Abnormal posture: Secondary | ICD-10-CM | POA: Insufficient documentation

## 2022-03-28 DIAGNOSIS — M6281 Muscle weakness (generalized): Secondary | ICD-10-CM | POA: Diagnosis not present

## 2022-03-28 DIAGNOSIS — R2681 Unsteadiness on feet: Secondary | ICD-10-CM | POA: Diagnosis not present

## 2022-03-28 MED ORDER — AMLODIPINE BESYLATE 5 MG PO TABS: 7.5000 mg | ORAL_TABLET | Freq: Every day | ORAL | 11 refills | Status: DC

## 2022-03-28 NOTE — Therapy (Addendum)
?OUTPATIENT PHYSICAL THERAPY TREATMENT NOTE ? ? ?Patient Name: Breanna [name] ?MRN: [id number] ?DOB:Feb 04, 1931, 88 y.o., female ?Today's Date: 03/28/2022 ? ?PCP: Plotnikov, Evie Lacks, MD ?REFERRING PROVIDER: Cassandria Anger, MD ? ? PT End of Session - 03/28/22 1023   ? ? Visit Number 4   ? Number of Visits 11   10+eval  ? Date for PT Re-Evaluation 05/18/22   ? Authorization Type [employer] 2023 VL: [place]   ? Authorization Time Period 03/09/22 - 05/18/22   ? Authorization - Visit Number 4   ? Authorization - Number of Visits 11   ? Progress Note Due on Visit 10   ? PT Start Time 1018   ? PT Stop Time 1104   ? PT Time Calculation (min) 46 min   ? Equipment Utilized During Treatment Gait belt   ? Activity Tolerance Patient tolerated treatment well   ? Behavior During Therapy [facility] for tasks assessed/performed   ? ?  ?  ? ?  ? ? ? ?Past Medical History:  ?Diagnosis Date  ? Adrenal adenoma 2006  ? Boils 2009  ? Cholelithiasis   ? asympt. w/normal HIDA 03/2010  ? CVA (cerebral infarction) 2010  ? Cerebellar  ? Diverticulitis   ? Esophageal spasm 2011  ? GERD (gastroesophageal reflux disease)   ? Gout   ? History of colon polyps   ? HTN (hypertension)   ? Hydronephrosis   ? LEFT/ Surgical intervention  ? Hyperlipidemia   ? LBP (low back pain)   ? Osteoarthritis   ? Pulmonary HTN ([place])   ? Stress   ? Type II or unspecified type diabetes mellitus without mention of complication, not stated as uncontrolled   ? ?Past Surgical History:  ?Procedure Laterality Date  ? ABDOMINAL HYSTERECTOMY    ? APPENDECTOMY    ? 2020  ? BACK SURGERY    ? BREAST BIOPSY    ? RIGHT  ? CATARACT EXTRACTION, BILATERAL    ? COLECTOMY  2006  ? Sigmoid  ? FOOT SURGERY    ? BILATERAL  ? HAMMER TOE SURGERY    ? INTRAMEDULLARY (IM) NAIL INTERTROCHANTERIC Right 07/21/2020  ? Procedure: INTRAMEDULLARY (IM) NAIL INTERTROCHANTRIC;  Surgeon: Renette Butters, MD;  Location: [facility];  Service: Orthopedics;  Laterality: Right;  ? ROTATOR CUFF REPAIR  2008  ? RIGHT  ?  TOTAL KNEE ARTHROPLASTY  2003  ? LEFT  ? VARICOSE VEIN SURGERY    ? vein stripping/lower extremities  ? ?Patient Active Problem List  ? Diagnosis Date Noted  ? Gait disorder 03/07/2022  ? Falls frequently 11/29/2021  ? Vision loss 11/29/2021  ? Parkinsonian features 05/23/2021  ? Atherosclerosis of aorta ([place]) 05/23/2021  ? Anemia 08/18/2020  ? Constipation 08/18/2020  ? Hip fracture requiring operative repair ([place]) 07/21/2020  ? Sinus bradycardia 07/21/2020  ? Hip pain 07/06/2020  ? Fall against object 12/25/2018  ? Head contusion 12/25/2018  ? Acute pain of right knee 09/28/2018  ? Well adult exam 05/13/2018  ? Cold sore 05/13/2018  ? Night sweats 04/04/2018  ? Breast symptom 10/30/2017  ? Diarrhea 08/21/2017  ? UTI (urinary tract infection) 08/21/2017  ? Lung nodule 08/21/2017  ? FTT (failure to thrive) in adult 08/21/2017  ? Shortness of breath 08/13/2017  ? Cough 08/13/2017  ? Fatigue 08/13/2017  ? Fever 08/13/2017  ? Blurry vision 08/13/2017  ? Shoulder pain, right 07/25/2017  ? Callus of foot 06/07/2017  ? Need for prophylactic vaccination and inoculation against  influenza 11/21/2016  ? Trigger finger, acquired 11/12/2016  ? Meteorism 07/11/2016  ? Dark stools 01/10/2016  ? Osteoporosis 07/13/2015  ? Fall at home 01/13/2015  ? Cornea disorder 11/22/2014  ? Primary open angle glaucoma of both eyes, indeterminate stage 11/22/2014  ? Secondary corneal edema 10/14/2014  ? Swelling of left knee joint 04/07/2014  ? Grief 01/04/2014  ? Syncope 01/04/2014  ? Thoracic spine pain 10/22/2013  ? Hypernatremia 07/07/2013  ? Viral intestinal infection 03/25/2013  ? Dehydration, moderate 03/24/2013  ? Food poisoning 03/24/2013  ? EPPIRJJO(841.6) 12/22/2012  ? URI (upper respiratory infection) 10/21/2012  ? Diabetic neuropathy ([place]) 07/30/2012  ? Neck pain on right side 07/30/2012  ? Cerumen impaction 07/17/2012  ? Cystoid macular edema 06/19/2012  ? History of corneal transplant 06/19/2012  ? Weight loss 04/10/2012  ? Wrist  pain, right 04/06/2011  ? PARESTHESIA 01/03/2011  ? Full incontinence of feces 01/01/2011  ? ABDOMINAL PAIN RIGHT UPPER QUADRANT 01/01/2011  ? LEG PAIN, LEFT 10/02/2010  ? History of cardioembolic cerebrovascular accident (CVA) 03/27/2010  ? GERD 03/27/2010  ? DIVERTICULOSIS-COLON 03/27/2010  ? CHOLELITHIASIS 03/27/2010  ? LOW BACK PAIN 09/07/2009  ? ANEMIA, IRON DEFICIENCY, HX OF 06/14/2009  ? DYSPHAGIA 05/16/2009  ? Dysphagia 05/16/2009  ? Dyslipidemia 11/06/2007  ? Hyperlipidemia 11/06/2007  ? Type 2 diabetes mellitus with renal manifestations, controlled ([place]) 07/29/2007  ? Gout 07/29/2007  ? Essential hypertension 07/29/2007  ? Hydronephrosis 07/29/2007  ? Osteoarthritis 07/29/2007  ? COLONIC POLYPS, HX OF 07/29/2007  ? DIVERTICULITIS, HX OF 07/29/2007  ? ? ?REFERRING DIAG: M54.40,G89.29 (ICD-10-CM) - Chronic midline low back pain with sciatica, sciatica laterality unspecified R26.9 (ICD-10-CM) - Gait disorder  ? ?THERAPY DIAG:  ?Unsteadiness on feet ? ?Muscle weakness (generalized) ? ?Other abnormalities of gait and mobility ? 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 ?PERTINENT HISTORY: OA, R intramedullary nail 07/21/2020, L TKA 2003, R RTC repair 2008, cerebellar CVA 2010, HTN, DM2 ? ?PRECAUTIONS: Fall, legally blind  ? ?SUBJECTIVE: No falls at home, but she has had high BP at home and felt unwell.  Her home nurse checked it.  She did a lot of walking this weekend for church event.  Pt reports she can see outlines and forms, can see lighter colors better, able to tell number of fingers therapist holds up.  She cannot distinguish between lighter colors well. ? ? ?PAIN:  ?Are you having pain? Yes: NPRS scale: 4/10 ?Pain location: R hip ?Pain description: "pressing against the metal" ?  ?  VITALS:  Checked in sitting prior to activity on LUE:  178/86 taken manually as electronic cuff reads error x2.  Retaken manually 2 mins later:  180/86 ?BP assessed end of session following NuStep  184/84 ?OBJECTIVE:  ? ?TODAYS TREATMENT ?Pt requires HHA into clinic w/ use of quad cane on L with one-step instructions to locate mat for seated exercise.  Ambulation to NuStep and out to lobby at end of session using cues for upright posture and proper use of quad cane. ? ?Ther ex ?Seated chin tucks w/ therapist facilitating movement as pt cannot see demo.  2x10 ?Seated scapular retractions 2x12, tactile cues between shoulder blades and facilitation of movement at anterior shoulders ?Added to HEP.  See bolded below ?Constant cuing for upright posture and forward lean using tactile and verbal cuing  ?NuStep used for LE engagement and reciprocal mobility x71mns L2 using BUE/BLE-dec resistance from last session due to BP; achieved 34 avg steps per min ? ?  PATIENT EDUCATION: ?Education details: Additions to HEP, monitoring BP at home prior to exercise and in general.  BP limits for exercise. ?Person educated: Patient; spoke to son in lobby ?Education method: Explanation and handout  ?Education comprehension: verbalized understanding and needs further education  ? ?HOME EXERCISE PROGRAM  ?Access Code: [email or web address] ?URL: [email or web address] ?Date: 03/21/2022 ?Prepared by: Mickie Bail Plaster ? ?Exercises ?- Lower Trunk Rotations  - 1 x daily - 4 x weekly - 2 sets - 8 reps ?- Supine March  - 1 x daily - 4 x weekly - 2 sets - 10 reps ?- Supine Bridge  - 1 x daily - 5 x weekly - 1 sets - 8 reps ?- Sit to Stand with Counter Support  - 1 x daily - 7 x weekly - 1 sets - 10 reps ?- Posterior pelvic tilt  - 1 x daily - 7 x weekly - 3 sets - 10 reps ?- Supine Bridge  - 1 x daily - 7 x weekly - 3 sets - 10 reps ? - Seated Scapular Retraction  - 1 x daily - 5 x weekly - 2 sets - 12 reps ?- Seated Chin Tuck with Neck Elongation  - 1 x daily - 4 x weekly - 2 sets - 10 reps ?  ?GOALS: ?Goals reviewed with patient? Yes ?SHORT TERM GOALS:  Target date:  04/13/2022 ?1.  Pt will be independent with initial strength and balance HEP  with supervision from family. ?Baseline:  To be initiated next visit ?                      Goal status:  INITIAL ?2.  TUG to be assessed next visit with appropriate LTG set. ?Baseline: Assessed 03/14/2022 ?Goal sta

## 2022-03-28 NOTE — Patient Instructions (Signed)
Access Code: [email or web address] ?URL: [email or web address] ?Date: 03/28/2022 ?Prepared by: Elease Etienne ? ?Exercises ?- Lower Trunk Rotations  - 1 x daily - 4 x weekly - 2 sets - 8 reps ?- Supine March  - 1 x daily - 4 x weekly - 2 sets - 10 reps ?- Supine Bridge  - 1 x daily - 5 x weekly - 1 sets - 8 reps ?- Sit to Stand with Counter Support  - 1 x daily - 7 x weekly - 1 sets - 10 reps ?- Posterior pelvic tilt  - 1 x daily - 7 x weekly - 3 sets - 10 reps ?- Supine Bridge  - 1 x daily - 7 x weekly - 3 sets - 10 reps ?- Seated Scapular Retraction  - 1 x daily - 5 x weekly - 2 sets - 12 reps ?- Seated Chin Tuck with Neck Elongation  - 1 x daily - 4 x weekly - 2 sets - 10 reps ?

## 2022-03-28 NOTE — Progress Notes (Addendum)
Patient is here in office for a BP check . Patient BP taken in left arm . BP 158/76.  ? ?Per provider for patient to increase her Amlodipine to 7.'5mg'$  a day. Patient notified and verbalize understanding. Patient scheduled to see provider in 2 weeks  ? ?Provider notified ? ? ?Medical screening examination/treatment/procedure(s) were performed by non-physician practitioner and as supervising physician I was immediately available for consultation/collaboration.  I agree with above. Lew Dawes, MD ?

## 2022-04-03 ENCOUNTER — Other Ambulatory Visit: Payer: Self-pay | Admitting: Internal Medicine

## 2022-04-04 ENCOUNTER — Ambulatory Visit: Payer: Medicare PPO | Admitting: Physical Therapy

## 2022-04-04 DIAGNOSIS — R2689 Other abnormalities of gait and mobility: Secondary | ICD-10-CM

## 2022-04-04 DIAGNOSIS — R2681 Unsteadiness on feet: Secondary | ICD-10-CM

## 2022-04-04 DIAGNOSIS — M6281 Muscle weakness (generalized): Secondary | ICD-10-CM

## 2022-04-04 NOTE — Therapy (Signed)
?OUTPATIENT PHYSICAL THERAPY TREATMENT NOTE- ARRIVED NO CHARGE ? ? ?Patient Name: Breanna [name] ?MRN: [id number] ?DOB:01-02-1931, 88 y.o., female ?Today's Date: 04/04/2022 ? ?PCP: Plotnikov, Evie Lacks, MD ?REFERRING PROVIDER: Cassandria Anger, MD ? ? PT End of Session - 04/04/22 1050   ? ? Visit Number 4   ? Number of Visits 11   10+eval  ? Date for PT Re-Evaluation 05/18/22   ? Authorization Type [employer] 2023 VL: [place]   ? Authorization Time Period 03/09/22 - 05/18/22   ? Authorization - Visit Number 4   ? Authorization - Number of Visits 11   ? Progress Note Due on Visit 10   ? PT Start Time 1048   ? PT Stop Time 1104   ? PT Time Calculation (min) 16 min   ? Equipment Utilized During Treatment --   ? Activity Tolerance Patient tolerated treatment well   ? Behavior During Therapy [facility] for tasks assessed/performed   ? ?  ?  ? ?  ? ? ? ? ?Past Medical History:  ?Diagnosis Date  ? Adrenal adenoma 2006  ? Boils 2009  ? Cholelithiasis   ? asympt. w/normal HIDA 03/2010  ? CVA (cerebral infarction) 2010  ? Cerebellar  ? Diverticulitis   ? Esophageal spasm 2011  ? GERD (gastroesophageal reflux disease)   ? Gout   ? History of colon polyps   ? HTN (hypertension)   ? Hydronephrosis   ? LEFT/ Surgical intervention  ? Hyperlipidemia   ? LBP (low back pain)   ? Osteoarthritis   ? Pulmonary HTN ([place])   ? Stress   ? Type II or unspecified type diabetes mellitus without mention of complication, not stated as uncontrolled   ? ?Past Surgical History:  ?Procedure Laterality Date  ? ABDOMINAL HYSTERECTOMY    ? APPENDECTOMY    ? 2020  ? BACK SURGERY    ? BREAST BIOPSY    ? RIGHT  ? CATARACT EXTRACTION, BILATERAL    ? COLECTOMY  2006  ? Sigmoid  ? FOOT SURGERY    ? BILATERAL  ? HAMMER TOE SURGERY    ? INTRAMEDULLARY (IM) NAIL INTERTROCHANTERIC Right 07/21/2020  ? Procedure: INTRAMEDULLARY (IM) NAIL INTERTROCHANTRIC;  Surgeon: Renette Butters, MD;  Location: [place];  Service: Orthopedics;  Laterality: Right;  ? ROTATOR CUFF REPAIR  2008   ? RIGHT  ? TOTAL KNEE ARTHROPLASTY  2003  ? LEFT  ? VARICOSE VEIN SURGERY    ? vein stripping/lower extremities  ? ?Patient Active Problem List  ? Diagnosis Date Noted  ? Gait disorder 03/07/2022  ? Falls frequently 11/29/2021  ? Vision loss 11/29/2021  ? Parkinsonian features 05/23/2021  ? Atherosclerosis of aorta ([place]) 05/23/2021  ? Anemia 08/18/2020  ? Constipation 08/18/2020  ? Hip fracture requiring operative repair ([place]) 07/21/2020  ? Sinus bradycardia 07/21/2020  ? Hip pain 07/06/2020  ? Fall against object 12/25/2018  ? Head contusion 12/25/2018  ? Acute pain of right knee 09/28/2018  ? Well adult exam 05/13/2018  ? Cold sore 05/13/2018  ? Night sweats 04/04/2018  ? Breast symptom 10/30/2017  ? Diarrhea 08/21/2017  ? UTI (urinary tract infection) 08/21/2017  ? Lung nodule 08/21/2017  ? FTT (failure to thrive) in adult 08/21/2017  ? Shortness of breath 08/13/2017  ? Cough 08/13/2017  ? Fatigue 08/13/2017  ? Fever 08/13/2017  ? Blurry vision 08/13/2017  ? Shoulder pain, right 07/25/2017  ? Callus of foot 06/07/2017  ? Need for prophylactic vaccination  and inoculation against influenza 11/21/2016  ? Trigger finger, acquired 11/12/2016  ? Meteorism 07/11/2016  ? Dark stools 01/10/2016  ? Osteoporosis 07/13/2015  ? Fall at home 01/13/2015  ? Cornea disorder 11/22/2014  ? Primary open angle glaucoma of both eyes, indeterminate stage 11/22/2014  ? Secondary corneal edema 10/14/2014  ? Swelling of left knee joint 04/07/2014  ? Grief 01/04/2014  ? Syncope 01/04/2014  ? Thoracic spine pain 10/22/2013  ? Hypernatremia 07/07/2013  ? Viral intestinal infection 03/25/2013  ? Dehydration, moderate 03/24/2013  ? Food poisoning 03/24/2013  ? WUJWJXBJ(478.2) 12/22/2012  ? URI (upper respiratory infection) 10/21/2012  ? Diabetic neuropathy ([place]) 07/30/2012  ? Neck pain on right side 07/30/2012  ? Cerumen impaction 07/17/2012  ? Cystoid macular edema 06/19/2012  ? History of corneal transplant 06/19/2012  ? Weight loss  04/10/2012  ? Wrist pain, right 04/06/2011  ? PARESTHESIA 01/03/2011  ? Full incontinence of feces 01/01/2011  ? ABDOMINAL PAIN RIGHT UPPER QUADRANT 01/01/2011  ? LEG PAIN, LEFT 10/02/2010  ? History of cardioembolic cerebrovascular accident (CVA) 03/27/2010  ? GERD 03/27/2010  ? DIVERTICULOSIS-COLON 03/27/2010  ? CHOLELITHIASIS 03/27/2010  ? LOW BACK PAIN 09/07/2009  ? ANEMIA, IRON DEFICIENCY, HX OF 06/14/2009  ? DYSPHAGIA 05/16/2009  ? Dysphagia 05/16/2009  ? Dyslipidemia 11/06/2007  ? Hyperlipidemia 11/06/2007  ? Type 2 diabetes mellitus with renal manifestations, controlled ([place]) 07/29/2007  ? Gout 07/29/2007  ? Essential hypertension 07/29/2007  ? Hydronephrosis 07/29/2007  ? Osteoarthritis 07/29/2007  ? COLONIC POLYPS, HX OF 07/29/2007  ? DIVERTICULITIS, HX OF 07/29/2007  ? ? ?REFERRING DIAG: M54.40,G89.29 (ICD-10-CM) - Chronic midline low back pain with sciatica, sciatica laterality unspecified R26.9 (ICD-10-CM) - Gait disorder  ? ?THERAPY DIAG:  ?Unsteadiness on feet ? ?Muscle weakness (generalized) ? ?Other abnormalities of gait and mobility ? ?PERTINENT HISTORY: OA, R intramedullary nail 07/21/2020, L TKA 2003, R RTC repair 2008, cerebellar CVA 2010, HTN, DM2 ? ?PRECAUTIONS: Fall, legally blind  ? ?SUBJECTIVE: "I have been very busy". No new changes, reports she did not know her appointment time had changed.  ? ? ?PAIN:  ?Are you having pain? No  ?NPRS scale: /10 ?Pain location:  ?Pain description: ?  ? VITALS:  Checked in sitting prior to activity on LUE:  183/10mHg, HR 51 bpm.  Retaken 2 mins later:  191/88 mmHg, HR 51 BPM. Pt reports she did not take her BP medication this morning  ? ?OBJECTIVE:  ? ?TODAYS TREATMENT ?Pt not treated today due to elevated BP and not taking her scheduled BP medicine prior to session. Encouraged pt and son to check BP closely at home today and contact MD if BP does not reduce. Pt and son verbalized understanding. Pt escorted out of clinic with son.  ? ? ?PATIENT  EDUCATION: ?Education details: Taking BP meds on time, monitoring BP at home prior to exercise and in general.  BP limits for exercise. ?Person educated: Patient and son  ?Education method: Explanation  ?Education comprehension: verbalized understanding and needs further education  ? ?HOME EXERCISE PROGRAM  ?Access Code: [email or web address] ?URL: [email or web address] ?Date: 03/21/2022 ?Prepared by: JMickie BailPlaster ? ?Exercises ?- Lower Trunk Rotations  - 1 x daily - 4 x weekly - 2 sets - 8 reps ?- Supine March  - 1 x daily - 4 x weekly - 2 sets - 10 reps ?- Supine Bridge  - 1 x daily - 5 x weekly - 1 sets - 8 reps ?- Sit to Stand with Counter Support  -  1 x daily - 7 x weekly - 1 sets - 10 reps ?- Posterior pelvic tilt  - 1 x daily - 7 x weekly - 3 sets - 10 reps ?- Supine Bridge  - 1 x daily - 7 x weekly - 3 sets - 10 reps ? - Seated Scapular Retraction  - 1 x daily - 5 x weekly - 2 sets - 12 reps ?- Seated Chin Tuck with Neck Elongation  - 1 x daily - 4 x weekly - 2 sets - 10 reps ?  ?GOALS: ?Goals reviewed with patient? Yes ?SHORT TERM GOALS:  Target date:  04/13/2022 ?1.  Pt will be independent with initial strength and balance HEP with supervision from family. ?Baseline:  To be initiated next visit ?                      Goal status:  INITIAL ?2.  TUG to be assessed next visit with appropriate LTG set. ?Baseline: Assessed 03/14/2022 ?Goal status: INITIAL ?3. Pt will decrease 5xSTS to <40 seconds in order to demonstrate decreased risk for falls and improved functional bilateral LE strength and power. ?           Baseline:  43.28sec ?           Goal status:  INITIAL ?4.  Pt will ambulate >200' w/ LRAD without rest to demonstrate improved household ambulation. ?Baseline:  100' w/ quad cane and hand over hand support ?Goal status:  INITIAL ?5.  BERG to be assessed and STG/LTG to be set. ?Baseline:  Assessed 03/14/2022 ?Goal status:  INITIAL ?  ?LONG TERM GOALS: Target date: 05/18/2022 ?Pt will be independent with  finalized HEP for core and LE strength and balance with supervision from family. ?Baseline:  ?Goal status: INITIAL ?  ?2.  Pt will demonstrate TUG of <30 seconds in order to decrease risk of falls and improve functional

## 2022-04-11 ENCOUNTER — Encounter: Payer: Self-pay | Admitting: Internal Medicine

## 2022-04-11 ENCOUNTER — Ambulatory Visit: Payer: Medicare PPO | Admitting: Internal Medicine

## 2022-04-11 ENCOUNTER — Ambulatory Visit: Payer: Medicare PPO | Admitting: Physical Therapy

## 2022-04-11 DIAGNOSIS — M153 Secondary multiple arthritis: Secondary | ICD-10-CM | POA: Diagnosis not present

## 2022-04-11 DIAGNOSIS — R2681 Unsteadiness on feet: Secondary | ICD-10-CM

## 2022-04-11 DIAGNOSIS — Z8673 Personal history of transient ischemic attack (TIA), and cerebral infarction without residual deficits: Secondary | ICD-10-CM | POA: Diagnosis not present

## 2022-04-11 DIAGNOSIS — M5441 Lumbago with sciatica, right side: Secondary | ICD-10-CM | POA: Diagnosis not present

## 2022-04-11 DIAGNOSIS — R627 Adult failure to thrive: Secondary | ICD-10-CM | POA: Diagnosis not present

## 2022-04-11 DIAGNOSIS — M6281 Muscle weakness (generalized): Secondary | ICD-10-CM | POA: Diagnosis not present

## 2022-04-11 DIAGNOSIS — R2689 Other abnormalities of gait and mobility: Secondary | ICD-10-CM | POA: Diagnosis not present

## 2022-04-11 DIAGNOSIS — M1000 Idiopathic gout, unspecified site: Secondary | ICD-10-CM

## 2022-04-11 DIAGNOSIS — R293 Abnormal posture: Secondary | ICD-10-CM | POA: Diagnosis not present

## 2022-04-11 DIAGNOSIS — G8929 Other chronic pain: Secondary | ICD-10-CM | POA: Diagnosis not present

## 2022-04-11 MED ORDER — ALLOPURINOL 100 MG PO TABS: 100.0000 mg | ORAL_TABLET | Freq: Every day | ORAL | 3 refills | Status: DC

## 2022-04-11 MED ORDER — SPIRONOLACTONE 25 MG PO TABS: ORAL_TABLET | ORAL | 1 refills | Status: DC

## 2022-04-11 NOTE — Assessment & Plan Note (Signed)
In PT 

## 2022-04-11 NOTE — Therapy (Signed)
?OUTPATIENT PHYSICAL THERAPY TREATMENT NOTE- ARRIVED NO CHARGE ? ? ?Patient Name: Breanna [name] ?MRN: [id number] ?DOB:17-Jul-1931, 88 y.o., female ?Today's Date: 04/11/2022 ? ?PCP: Plotnikov, Evie Lacks, MD ?REFERRING PROVIDER: Cassandria Anger, MD ? ? PT End of Session - 04/11/22 1023   ? ? Visit Number 5   ? Number of Visits 11   10+eval  ? Date for PT Re-Evaluation 05/18/22   ? Authorization Type [employer] 2023 VL: [place]   ? Authorization Time Period 03/09/22 - 05/18/22   ? Authorization - Visit Number 4   ? Authorization - Number of Visits 11   ? Progress Note Due on Visit 10   ? PT Start Time 1020   ? PT Stop Time 9702   ? PT Time Calculation (min) 43 min   ? Activity Tolerance Patient tolerated treatment well   ? Behavior During Therapy [facility] for tasks assessed/performed   ? ?  ?  ? ?  ? ? ? ? ?Past Medical History:  ?Diagnosis Date  ? Adrenal adenoma 2006  ? Boils 2009  ? Cholelithiasis   ? asympt. w/normal HIDA 03/2010  ? CVA (cerebral infarction) 2010  ? Cerebellar  ? Diverticulitis   ? Esophageal spasm 2011  ? GERD (gastroesophageal reflux disease)   ? Gout   ? History of colon polyps   ? HTN (hypertension)   ? Hydronephrosis   ? LEFT/ Surgical intervention  ? Hyperlipidemia   ? LBP (low back pain)   ? Osteoarthritis   ? Pulmonary HTN ([place])   ? Stress   ? Type II or unspecified type diabetes mellitus without mention of complication, not stated as uncontrolled   ? ?Past Surgical History:  ?Procedure Laterality Date  ? ABDOMINAL HYSTERECTOMY    ? APPENDECTOMY    ? 2020  ? BACK SURGERY    ? BREAST BIOPSY    ? RIGHT  ? CATARACT EXTRACTION, BILATERAL    ? COLECTOMY  2006  ? Sigmoid  ? FOOT SURGERY    ? BILATERAL  ? HAMMER TOE SURGERY    ? INTRAMEDULLARY (IM) NAIL INTERTROCHANTERIC Right 07/21/2020  ? Procedure: INTRAMEDULLARY (IM) NAIL INTERTROCHANTRIC;  Surgeon: Renette Butters, MD;  Location: [place];  Service: Orthopedics;  Laterality: Right;  ? ROTATOR CUFF REPAIR  2008  ? RIGHT  ? TOTAL KNEE ARTHROPLASTY  2003   ? LEFT  ? VARICOSE VEIN SURGERY    ? vein stripping/lower extremities  ? ?Patient Active Problem List  ? Diagnosis Date Noted  ? Gait disorder 03/07/2022  ? Falls frequently 11/29/2021  ? Vision loss 11/29/2021  ? Parkinsonian features 05/23/2021  ? Atherosclerosis of aorta ([place]) 05/23/2021  ? Anemia 08/18/2020  ? Constipation 08/18/2020  ? Hip fracture requiring operative repair ([place]) 07/21/2020  ? Sinus bradycardia 07/21/2020  ? Hip pain 07/06/2020  ? Fall against object 12/25/2018  ? Head contusion 12/25/2018  ? Acute pain of right knee 09/28/2018  ? Well adult exam 05/13/2018  ? Cold sore 05/13/2018  ? Night sweats 04/04/2018  ? Breast symptom 10/30/2017  ? Diarrhea 08/21/2017  ? UTI (urinary tract infection) 08/21/2017  ? Lung nodule 08/21/2017  ? FTT (failure to thrive) in adult 08/21/2017  ? Shortness of breath 08/13/2017  ? Cough 08/13/2017  ? Fatigue 08/13/2017  ? Fever 08/13/2017  ? Blurry vision 08/13/2017  ? Shoulder pain, right 07/25/2017  ? Callus of foot 06/07/2017  ? Need for prophylactic vaccination and inoculation against influenza 11/21/2016  ? Trigger  finger, acquired 11/12/2016  ? Meteorism 07/11/2016  ? Dark stools 01/10/2016  ? Osteoporosis 07/13/2015  ? Fall at home 01/13/2015  ? Cornea disorder 11/22/2014  ? Primary open angle glaucoma of both eyes, indeterminate stage 11/22/2014  ? Secondary corneal edema 10/14/2014  ? Swelling of left knee joint 04/07/2014  ? Grief 01/04/2014  ? Syncope 01/04/2014  ? Thoracic spine pain 10/22/2013  ? Hypernatremia 07/07/2013  ? Viral intestinal infection 03/25/2013  ? Dehydration, moderate 03/24/2013  ? Food poisoning 03/24/2013  ? YPPJKDTO(671.2) 12/22/2012  ? URI (upper respiratory infection) 10/21/2012  ? Diabetic neuropathy ([place]) 07/30/2012  ? Neck pain on right side 07/30/2012  ? Cerumen impaction 07/17/2012  ? Cystoid macular edema 06/19/2012  ? History of corneal transplant 06/19/2012  ? Weight loss 04/10/2012  ? Wrist pain, right 04/06/2011  ?  PARESTHESIA 01/03/2011  ? Full incontinence of feces 01/01/2011  ? ABDOMINAL PAIN RIGHT UPPER QUADRANT 01/01/2011  ? LEG PAIN, LEFT 10/02/2010  ? History of cardioembolic cerebrovascular accident (CVA) 03/27/2010  ? GERD 03/27/2010  ? DIVERTICULOSIS-COLON 03/27/2010  ? CHOLELITHIASIS 03/27/2010  ? LOW BACK PAIN 09/07/2009  ? ANEMIA, IRON DEFICIENCY, HX OF 06/14/2009  ? DYSPHAGIA 05/16/2009  ? Dysphagia 05/16/2009  ? Dyslipidemia 11/06/2007  ? Hyperlipidemia 11/06/2007  ? Type 2 diabetes mellitus with renal manifestations, controlled ([place]) 07/29/2007  ? Gout 07/29/2007  ? Essential hypertension 07/29/2007  ? Hydronephrosis 07/29/2007  ? Osteoarthritis 07/29/2007  ? COLONIC POLYPS, HX OF 07/29/2007  ? DIVERTICULITIS, HX OF 07/29/2007  ? ? ?REFERRING DIAG: M54.40,G89.29 (ICD-10-CM) - Chronic midline low back pain with sciatica, sciatica laterality unspecified R26.9 (ICD-10-CM) - Gait disorder  ? ?THERAPY DIAG:  ?Unsteadiness on feet ? ?Muscle weakness (generalized) ? ?Other abnormalities of gait and mobility ? ?Abnormal posture ? ?PERTINENT HISTORY: OA, R intramedullary nail 07/21/2020, L TKA 2003, R RTC repair 2008, cerebellar CVA 2010, HTN, DM2 ? ?PRECAUTIONS: Fall, legally blind  ? ?SUBJECTIVE: Pt reports her MD increased her BP meds recently, has not been checking her BP at home. No new changes  ? ? ?PAIN:  ?Are you having pain? No  ?NPRS scale: /10 ?Pain location:  ?Pain description: ?  ?VITALS:  Checked manually in sitting prior to activity on LUE:   ?BP taken manually: 160/74 mmHg  ?Retaken 5 mins later:  155/77 mmHg ? ?OBJECTIVE:  ? ?TODAYS TREATMENT ? Ther Ex  ?NuStep level 4 for 8 minutes with BUE/BLEs for dynamic cardiovascular warmup, BLE strength and low back strength. Pt maintained steps/min >55 throughout and noted good UE/LE flexion/extension ROM. ? ?Blocked sit <>stand practice from mat without use of BUEs for improved BLE strength, anterior weight shift and transfers. Pt unable to come to full stand  without use of UE but is able to clear hips from mat. Mod verbal cues for "nose over toes" and to reach out towards therapist seated in front of her to facilitate greater weight shift. Pt able to perform w/min HHA from therapist x3.  ? ?Ther Act  ?STG assessment  ? [facility] PT Assessment - 04/11/22 1054   ? ?  ? Transfers  ? Five time sit to stand comments  24.69s w/BUE support and posterior lean   ? ?  ?  ? ?  ? ?Gait pattern: step through pattern, decreased stride length, Right foot flat, Left foot flat, shuffling, trunk flexed, narrow BOS, poor foot clearance- Right, and poor foot clearance- Left ?Distance walked: 230' ?Assistive device utilized: Quad cane large base ?Level of assistance:  Min A ?Comments: Pt ambulated 2 laps around gym, turning to R side, w/ LBQC in LUE and HHA in R. Pt demonstrated significant forward trunk lean and required min A to prevent fall after multiple anterior tripping episodes due to poor AD placement (hitting obstacles on L side) and decreased step clearance.  ? ? ? ?PATIENT EDUCATION: ?Education details: Monitoring BP at home and obtaining new BP machine, STG assessment  ?Person educated: Patient and son  ?Education method: Explanation  ?Education comprehension: verbalized understanding and needs further education  ? ?HOME EXERCISE PROGRAM  ?Access Code: [email or web address] ?URL: [email or web address] ?Date: 03/21/2022 ?Prepared by: Mickie Bail Corgan Mormile ? ?Exercises ?- Lower Trunk Rotations  - 1 x daily - 4 x weekly - 2 sets - 8 reps ?- Supine March  - 1 x daily - 4 x weekly - 2 sets - 10 reps ?- Supine Bridge  - 1 x daily - 5 x weekly - 1 sets - 8 reps ?- Sit to Stand with Counter Support  - 1 x daily - 7 x weekly - 1 sets - 10 reps ?- Posterior pelvic tilt  - 1 x daily - 7 x weekly - 3 sets - 10 reps ?- Supine Bridge  - 1 x daily - 7 x weekly - 3 sets - 10 reps ? - Seated Scapular Retraction  - 1 x daily - 5 x weekly - 2 sets - 12 reps ?- Seated Chin Tuck with Neck Elongation  - 1 x  daily - 4 x weekly - 2 sets - 10 reps ?  ?GOALS: ?Goals reviewed with patient? Yes ?SHORT TERM GOALS:  Target date:  04/13/2022 ?1.  Pt will be independent with initial strength and balance HEP with supervision

## 2022-04-11 NOTE — Progress Notes (Signed)
? ?Subjective:  ?Patient ID: Breanna [name], female    DOB: [birth year]  Age: 88 y.o. MRN: [id number] ? ?CC: No chief complaint on file. ? ? ?HPI ?[facility] presents for elevated BP - SBP 150-178 at PT. ?F/u OA/ataxia ?Patient was here in office for a BP check on 03/28/22.  BP was 158/76.  ?We increased her Amlodipine to 7.'5mg'$  a day. ? ? ?Outpatient Medications Prior to Visit  ?Medication Sig Dispense Refill  ? acetaminophen (TYLENOL) 325 MG tablet Take 2 tablets (650 mg total) by mouth every 6 (six) hours as needed for moderate pain or fever.    ? Alcohol Swabs (TRUE COMFORT ALCOHOL PREP PADS) 70 % PADS Use as directed 300 each 1  ? amLODipine (NORVASC) 5 MG tablet Take 1.5 tablets (7.5 mg total) by mouth daily. 45 tablet 11  ? ascorbic acid (VITAMIN C) 500 MG tablet TAKE 1 TABLET BY MOUTH EVERY DAY 30 tablet 11  ? ASPIRIN LOW DOSE 81 MG EC tablet TAKE 1 TABLET BY MOUTH DAILY. SWALLOW WHOLE. 30 tablet 11  ? atropine 1 % ophthalmic solution atropine 1 % eye drops    ? Blood Glucose Calibration (TRUE METRIX LEVEL 1) Low SOLN USE AS DIRECTED 3 each 3  ? calcium carbonate (OS-CAL) 600 MG tablet Take 1 tablet (600 mg total) by mouth daily. 90 tablet 3  ? calcium carbonate (OSCAL) 1500 (600 Ca) MG TABS tablet TAKE 1 TABLET BY MOUTH EVERY DAY 30 tablet 11  ? carbidopa-levodopa (SINEMET IR) 10-100 MG tablet Take 1 tablet by mouth in the morning. 90 tablet 11  ? carvedilol (COREG) 25 MG tablet Take 1 tablet (25 mg total) by mouth 2 (two) times daily with a meal. 180 tablet 3  ? Cholecalciferol (VITAMIN D3) 25 MCG (1000 UT) CAPS Take 1 capsule (1,000 Units total) by mouth daily. 90 capsule 3  ? cyanocobalamin 1000 MCG tablet Take 1 tablet (1,000 mcg total) by mouth daily. 90 tablet 3  ? diclofenac sodium (VOLTAREN) 1 % GEL Apply 2 g topically 2 (two) times daily. 200 g 3  ? docusate sodium (COLACE) 100 MG capsule TAKE 1 CAPSULE BY MOUTH TWICE A DAY 60 capsule 11  ? dorzolamide-timolol (COSOPT) 22.3-6.8 MG/ML ophthalmic  solution Place 1 drop into both eyes 2 (two) times daily.     ? feeding supplement, ENSURE ENLIVE, (ENSURE ENLIVE) LIQD Take 237 mLs by mouth daily at 2 PM. 237 mL 12  ? feeding supplement, GLUCERNA SHAKE, (GLUCERNA SHAKE) LIQD Take 237 mLs by mouth 2 (two) times daily between meals.  0  ? gabapentin (NEURONTIN) 100 MG capsule TAKE 1 CAPSULE BY MOUTH THREE TIMES A DAY NEED APPT 90 capsule 3  ? glucose blood (GNP TRUE METRIX GLUCOSE STRIPS) test strip Use to check blood sugars twice a day 200 each 2  ? latanoprost (XALATAN) 0.005 % ophthalmic solution Place 1 drop into both eyes at bedtime.     ? lidocaine-prilocaine (EMLA) cream Apply 1 application topically as needed. 30 g 0  ? losartan (COZAAR) 50 MG tablet Take 1 tablet (50 mg total) by mouth daily. 90 tablet 3  ? metFORMIN (GLUCOPHAGE) 500 MG tablet Take 1 tablet (500 mg total) by mouth daily with breakfast. Must keep scheduled appt for future refills 30 tablet 5  ? NON FORMULARY Place 10 drops under the tongue daily as needed (Pain). CBD oil    ? pantoprazole (PROTONIX) 40 MG tablet TAKE 1 TABLET BY MOUTH DAILY 30 tablet 11  ?  polyethylene glycol (MIRALAX / GLYCOLAX) 17 g packet Take 17 g by mouth daily as needed for mild constipation. 14 each 0  ? senna (SENOKOT) 8.6 MG TABS tablet Take 1 tablet (8.6 mg total) by mouth 2 (two) times daily. 120 tablet 0  ? sertraline (ZOLOFT) 100 MG tablet TAKE 1 TABLET BY MOUTH DAILY 30 tablet 11  ? TRUEplus Lancets 33G MISC USE AS DIRECTED TO CHECK BLOOD SUGAR TWICE A DAY 200 each 1  ? valACYclovir (VALTREX) 500 MG tablet Take 1 tablet (500 mg total) by mouth 2 (two) times daily. 14 tablet 1  ? allopurinol (ZYLOPRIM) 300 MG tablet TAKE 1 TABLET BY MOUTH DAILY 30 tablet 11  ? spironolactone (ALDACTONE) 25 MG tablet 1 tab by mouth mon - wed - fri 90 tablet 3  ? ?No facility-administered medications prior to visit.  ? ? ?ROS: ?Review of Systems  ?Constitutional:  Negative for activity change, appetite change, chills, fatigue and  unexpected weight change.  ?HENT:  Negative for congestion, mouth sores and sinus pressure.   ?Eyes:  Negative for visual disturbance.  ?Respiratory:  Negative for cough and chest tightness.   ?Gastrointestinal:  Negative for abdominal pain and nausea.  ?Genitourinary:  Negative for difficulty urinating, frequency and vaginal pain.  ?Musculoskeletal:  Positive for arthralgias, back pain and gait problem.  ?Skin:  Negative for pallor and rash.  ?Neurological:  Positive for dizziness and weakness. Negative for tremors, numbness and headaches.  ?Psychiatric/Behavioral:  Negative for confusion and sleep disturbance. The patient is nervous/anxious.   ? ?Objective:  ?BP 140/62 (BP Location: Left Arm, Patient Position: Sitting, Cuff Size: Large)   Pulse (!) 54   Temp 98.4 ?F (36.9 ?C) (Oral)   Ht '5\' 4"'$  (1.626 m)   Wt 124 lb (56.2 kg)   SpO2 97%   BMI 21.28 kg/m?  ? ?BP Readings from Last 3 Encounters:  ?04/11/22 140/62  ?03/07/22 138/72  ?11/29/21 (!) 162/78  ? ? ?Wt Readings from Last 3 Encounters:  ?04/11/22 124 lb (56.2 kg)  ?03/07/22 127 lb (57.6 kg)  ?11/29/21 124 lb 11.2 oz (56.6 kg)  ? ? ?Physical Exam ?Constitutional:   ?   General: She is not in acute distress. ?   Appearance: Normal appearance. She is well-developed.  ?HENT:  ?   Head: Normocephalic.  ?   Right Ear: External ear normal.  ?   Left Ear: External ear normal.  ?   Nose: Nose normal.  ?Eyes:  ?   General:     ?   Right eye: No discharge.     ?   Left eye: No discharge.  ?   Conjunctiva/sclera: Conjunctivae normal.  ?   Pupils: Pupils are equal, round, and reactive to light.  ?Neck:  ?   Thyroid: No thyromegaly.  ?   Vascular: No JVD.  ?   Trachea: No tracheal deviation.  ?Cardiovascular:  ?   Rate and Rhythm: Normal rate and regular rhythm.  ?   Heart sounds: Normal heart sounds.  ?Pulmonary:  ?   Effort: No respiratory distress.  ?   Breath sounds: No stridor. No wheezing.  ?Abdominal:  ?   General: Bowel sounds are normal. There is no  distension.  ?   Palpations: Abdomen is soft. There is no mass.  ?   Tenderness: There is no abdominal tenderness. There is no guarding or rebound.  ?Musculoskeletal:     ?   General: Tenderness present.  ?   Cervical  back: Normal range of motion and neck supple. No rigidity.  ?Lymphadenopathy:  ?   Cervical: No cervical adenopathy.  ?Skin: ?   Findings: No erythema or rash.  ?Neurological:  ?   Cranial Nerves: No cranial nerve deficit.  ?   Motor: No abnormal muscle tone.  ?   Coordination: Coordination abnormal.  ?   Gait: Gait abnormal.  ?   Deep Tendon Reflexes: Reflexes normal.  ?Psychiatric:     ?   Behavior: Behavior normal.     ?   Thought Content: Thought content normal.     ?   Judgment: Judgment normal.  ?Using a cane ? ?Lab Results  ?Component Value Date  ? WBC 4.9 03/07/2022  ? HGB 11.0 (L) 03/07/2022  ? HCT 34.6 (L) 03/07/2022  ? PLT 130.0 (L) 03/07/2022  ? GLUCOSE 94 03/07/2022  ? CHOL 155 07/28/2021  ? TRIG 78.0 07/28/2021  ? HDL 56.60 07/28/2021  ? [place] 83 07/28/2021  ? ALT 9 03/07/2022  ? AST 19 03/07/2022  ? NA 143 03/07/2022  ? K 4.2 03/07/2022  ? CL 109 03/07/2022  ? CREATININE 1.15 03/07/2022  ? BUN 29 (H) 03/07/2022  ? CO2 28 03/07/2022  ? TSH 0.79 03/07/2022  ? HGBA1C 6.3 03/07/2022  ? ? ?MM 3D SCREEN BREAST BILATERAL ? ?Result Date: 11/28/2021 ?CLINICAL DATA:  Screening. EXAM: DIGITAL SCREENING BILATERAL MAMMOGRAM WITH TOMOSYNTHESIS AND CAD TECHNIQUE: Bilateral screening digital craniocaudal and mediolateral oblique mammograms were obtained. Bilateral screening digital breast tomosynthesis was performed. The images were evaluated with computer-aided detection. COMPARISON:  Previous exam(s). ACR Breast Density Category b: There are scattered areas of fibroglandular density. FINDINGS: There are no findings suspicious for malignancy. IMPRESSION: No mammographic evidence of malignancy. A result letter of this screening mammogram will be mailed directly to the patient. RECOMMENDATION: Screening  mammogram in one year. (Code:[id number]-B-01Y) BI-RADS CATEGORY  1: Negative. Electronically Signed   By: Lovey Newcomer M.D.   On: 11/28/2021 16:37  ? ? ?Assessment & Plan:  ? ?Problem List Items Addressed This Visit   ? ? Go

## 2022-04-11 NOTE — Assessment & Plan Note (Signed)
Will reduce Allopurinol from 300 mg/d to 100 mg/d due to CRI 

## 2022-04-11 NOTE — Assessment & Plan Note (Signed)
Start taking Amlodipine 10 mg a day if BP is high, ie BP>170/100 ?

## 2022-04-11 NOTE — Assessment & Plan Note (Signed)
Blue-Emu cream was recommended to use 2-3 times a day ? ?

## 2022-04-11 NOTE — Patient Instructions (Addendum)
Start taking Amlodipine 10 mg a day if BP is high, ie BP>170/100 ? ?Blue-Emu cream -- use 2-3 times a day ? ?We reduced Allopurinol from 300 mg to 100 mg/day ? ? ? ?

## 2022-04-13 ENCOUNTER — Other Ambulatory Visit: Payer: Self-pay | Admitting: *Deleted

## 2022-04-13 MED ORDER — VITAMIN D3 25 MCG (1000 UT) PO CAPS: 1000.0000 [IU] | ORAL_CAPSULE | Freq: Every day | ORAL | 3 refills | Status: DC

## 2022-04-16 DIAGNOSIS — Z9889 Other specified postprocedural states: Secondary | ICD-10-CM | POA: Diagnosis not present

## 2022-04-16 DIAGNOSIS — Z947 Corneal transplant status: Secondary | ICD-10-CM | POA: Diagnosis not present

## 2022-04-16 DIAGNOSIS — Z961 Presence of intraocular lens: Secondary | ICD-10-CM | POA: Diagnosis not present

## 2022-04-16 DIAGNOSIS — H401133 Primary open-angle glaucoma, bilateral, severe stage: Secondary | ICD-10-CM | POA: Diagnosis not present

## 2022-04-18 ENCOUNTER — Ambulatory Visit: Payer: Medicare PPO | Admitting: Physical Therapy

## 2022-04-18 DIAGNOSIS — R2681 Unsteadiness on feet: Secondary | ICD-10-CM | POA: Diagnosis not present

## 2022-04-18 DIAGNOSIS — M5441 Lumbago with sciatica, right side: Secondary | ICD-10-CM | POA: Diagnosis not present

## 2022-04-18 DIAGNOSIS — M6281 Muscle weakness (generalized): Secondary | ICD-10-CM | POA: Diagnosis not present

## 2022-04-18 DIAGNOSIS — G8929 Other chronic pain: Secondary | ICD-10-CM | POA: Diagnosis not present

## 2022-04-18 DIAGNOSIS — R2689 Other abnormalities of gait and mobility: Secondary | ICD-10-CM

## 2022-04-18 DIAGNOSIS — R293 Abnormal posture: Secondary | ICD-10-CM | POA: Diagnosis not present

## 2022-04-18 NOTE — Therapy (Signed)
?OUTPATIENT PHYSICAL THERAPY TREATMENT NOTE- ARRIVED NO CHARGE ? ? ?Patient Name: Breanna [name] ?MRN: [id number] ?DOB:10/14/1931, 88 y.o., female ?Today's Date: 04/18/2022 ? ?PCP: Plotnikov, Evie Lacks, MD ?REFERRING PROVIDER: Cassandria Anger, MD ? ? PT End of Session - 04/18/22 1020   ? ? Visit Number 6   ? Number of Visits 11   10+eval  ? Date for PT Re-Evaluation 05/18/22   ? Authorization Type [employer] 2023 VL: [place]   ? Authorization Time Period 03/09/22 - 05/18/22   ? Authorization - Visit Number 4   ? Authorization - Number of Visits 11   ? Progress Note Due on Visit 10   ? PT Start Time 1015   ? PT Stop Time 1100   ? PT Time Calculation (min) 45 min   ? Activity Tolerance Patient tolerated treatment well   ? Behavior During Therapy [facility] for tasks assessed/performed   ? ?  ?  ? ?  ? ? ? ? ? ?Past Medical History:  ?Diagnosis Date  ? Adrenal adenoma 2006  ? Boils 2009  ? Cholelithiasis   ? asympt. w/normal HIDA 03/2010  ? CVA (cerebral infarction) 2010  ? Cerebellar  ? Diverticulitis   ? Esophageal spasm 2011  ? GERD (gastroesophageal reflux disease)   ? Gout   ? History of colon polyps   ? HTN (hypertension)   ? Hydronephrosis   ? LEFT/ Surgical intervention  ? Hyperlipidemia   ? LBP (low back pain)   ? Osteoarthritis   ? Pulmonary HTN ([place])   ? Stress   ? Type II or unspecified type diabetes mellitus without mention of complication, not stated as uncontrolled   ? ?Past Surgical History:  ?Procedure Laterality Date  ? ABDOMINAL HYSTERECTOMY    ? APPENDECTOMY    ? 2020  ? BACK SURGERY    ? BREAST BIOPSY    ? RIGHT  ? CATARACT EXTRACTION, BILATERAL    ? COLECTOMY  2006  ? Sigmoid  ? FOOT SURGERY    ? BILATERAL  ? HAMMER TOE SURGERY    ? INTRAMEDULLARY (IM) NAIL INTERTROCHANTERIC Right 07/21/2020  ? Procedure: INTRAMEDULLARY (IM) NAIL INTERTROCHANTRIC;  Surgeon: Renette Butters, MD;  Location: [place];  Service: Orthopedics;  Laterality: Right;  ? ROTATOR CUFF REPAIR  2008  ? RIGHT  ? TOTAL KNEE ARTHROPLASTY  2003   ? LEFT  ? VARICOSE VEIN SURGERY    ? vein stripping/lower extremities  ? ?Patient Active Problem List  ? Diagnosis Date Noted  ? Gait disorder 03/07/2022  ? Falls frequently 11/29/2021  ? Vision loss 11/29/2021  ? Parkinsonian features 05/23/2021  ? Atherosclerosis of aorta ([place]) 05/23/2021  ? Anemia 08/18/2020  ? Constipation 08/18/2020  ? Hip fracture requiring operative repair ([place]) 07/21/2020  ? Sinus bradycardia 07/21/2020  ? Hip pain 07/06/2020  ? Fall against object 12/25/2018  ? Head contusion 12/25/2018  ? Acute pain of right knee 09/28/2018  ? Well adult exam 05/13/2018  ? Cold sore 05/13/2018  ? Night sweats 04/04/2018  ? Breast symptom 10/30/2017  ? Diarrhea 08/21/2017  ? UTI (urinary tract infection) 08/21/2017  ? Lung nodule 08/21/2017  ? FTT (failure to thrive) in adult 08/21/2017  ? Shortness of breath 08/13/2017  ? Cough 08/13/2017  ? Fatigue 08/13/2017  ? Fever 08/13/2017  ? Blurry vision 08/13/2017  ? Shoulder pain, right 07/25/2017  ? Callus of foot 06/07/2017  ? Need for prophylactic vaccination and inoculation against influenza 11/21/2016  ?  Trigger finger, acquired 11/12/2016  ? Meteorism 07/11/2016  ? Dark stools 01/10/2016  ? Osteoporosis 07/13/2015  ? Fall at home 01/13/2015  ? Cornea disorder 11/22/2014  ? Primary open angle glaucoma of both eyes, indeterminate stage 11/22/2014  ? Secondary corneal edema 10/14/2014  ? Swelling of left knee joint 04/07/2014  ? Grief 01/04/2014  ? Syncope 01/04/2014  ? Thoracic spine pain 10/22/2013  ? Hypernatremia 07/07/2013  ? Viral intestinal infection 03/25/2013  ? Dehydration, moderate 03/24/2013  ? Food poisoning 03/24/2013  ? OVFIEPPI(951.8) 12/22/2012  ? URI (upper respiratory infection) 10/21/2012  ? Diabetic neuropathy ([place]) 07/30/2012  ? Neck pain on right side 07/30/2012  ? Cerumen impaction 07/17/2012  ? Cystoid macular edema 06/19/2012  ? History of corneal transplant 06/19/2012  ? Weight loss 04/10/2012  ? Wrist pain, right 04/06/2011  ?  PARESTHESIA 01/03/2011  ? Full incontinence of feces 01/01/2011  ? ABDOMINAL PAIN RIGHT UPPER QUADRANT 01/01/2011  ? LEG PAIN, LEFT 10/02/2010  ? History of cardioembolic cerebrovascular accident (CVA) 03/27/2010  ? GERD 03/27/2010  ? DIVERTICULOSIS-COLON 03/27/2010  ? CHOLELITHIASIS 03/27/2010  ? LOW BACK PAIN 09/07/2009  ? ANEMIA, IRON DEFICIENCY, HX OF 06/14/2009  ? DYSPHAGIA 05/16/2009  ? Dysphagia 05/16/2009  ? Dyslipidemia 11/06/2007  ? Hyperlipidemia 11/06/2007  ? Type 2 diabetes mellitus with renal manifestations, controlled ([place]) 07/29/2007  ? Gout 07/29/2007  ? Essential hypertension 07/29/2007  ? Hydronephrosis 07/29/2007  ? Osteoarthritis 07/29/2007  ? COLONIC POLYPS, HX OF 07/29/2007  ? DIVERTICULITIS, HX OF 07/29/2007  ? ? ?REFERRING DIAG: M54.40,G89.29 (ICD-10-CM) - Chronic midline low back pain with sciatica, sciatica laterality unspecified R26.9 (ICD-10-CM) - Gait disorder  ? ?THERAPY DIAG:  ?Muscle weakness (generalized) ? ?Unsteadiness on feet ? ?Other abnormalities of gait and mobility ? ?PERTINENT HISTORY: OA, R intramedullary nail 07/21/2020, L TKA 2003, R RTC repair 2008, cerebellar CVA 2010, HTN, DM2 ? ?PRECAUTIONS: Fall, legally blind  ? ?SUBJECTIVE: Pt reports she took her meds this morning but did not eat. Requested to use restroom at beginning of session. Has not been doing her HEP ? ? ?PAIN:  ?Are you having pain? No  ?NPRS scale: /10 ?Pain location:  ?Pain description: ?  ?VITALS:  BP checked manually in seated position on LUE:   ?After using restroom: 176/69 mmHg ?Retaken 5 mins later: 175/71 mmHg ? ?OBJECTIVE:  ? ?TODAYS TREATMENT ?Self-care/Gait Training  ?Pt ambulated from waiting room to mat w/ LBQC in LUE and HHA in RUE by therapist. Max tactile cues provided to pelvis to guide pt as she is legally blind along w/max visual cues for direction. Once seated, pt reported need to use restroom, so ambulated to restroom using same technique as above. Pt required mod A for toileting due to  visual deficits. Ambulated back to mat w/HHA and [facility] w/max multimodal cues for direction. Noted poor placement of LBQC throughout (tripping on cane) and max cues for to avoid obstacles. Assisted pt back to waiting room at end of session using same technique.  ? ?Ther Ex  ?NuStep level 4 for 10 minutes with BUE/BLEs for dynamic cardiovascular warmup, BLE strength and low back strength. Pt maintained steps/min >55 throughout and noted good UE/LE flexion/extension ROM. ? ? ? ?PATIENT EDUCATION: ?Education details: Encouraged pt to perform HEP at home consistently w/assistance from son ?Person educated: Patient  ?Education method: Explanation  ?Education comprehension: verbalized understanding and needs further education  ? ?HOME EXERCISE PROGRAM  ?Access Code: [email or web address] ?URL: [email or web address] ?Date: 03/21/2022 ?Prepared by: Mickie Bail Jennessa Trigo ? ?  Exercises ?- Lower Trunk Rotations  - 1 x daily - 4 x weekly - 2 sets - 8 reps ?- Supine March  - 1 x daily - 4 x weekly - 2 sets - 10 reps ?- Supine Bridge  - 1 x daily - 5 x weekly - 1 sets - 8 reps ?- Sit to Stand with Counter Support  - 1 x daily - 7 x weekly - 1 sets - 10 reps ?- Posterior pelvic tilt  - 1 x daily - 7 x weekly - 3 sets - 10 reps ?- Supine Bridge  - 1 x daily - 7 x weekly - 3 sets - 10 reps ? - Seated Scapular Retraction  - 1 x daily - 5 x weekly - 2 sets - 12 reps ?- Seated Chin Tuck with Neck Elongation  - 1 x daily - 4 x weekly - 2 sets - 10 reps ?  ?GOALS: ?Goals reviewed with patient? Yes ?SHORT TERM GOALS:  Target date:  04/13/2022 ?1.  Pt will be independent with initial strength and balance HEP with supervision from family. ?Baseline:  To be initiated next visit ?                      Goal status:  MET ? ?2.  TUG to be assessed next visit with appropriate LTG set. ?Baseline: Assessed 03/14/2022 ?Goal status: MET ? ?3. Pt will decrease 5xSTS to <40 seconds in order to demonstrate decreased risk for falls and improved functional bilateral  LE strength and power. ?           Baseline:  43.28sec; 24.69s w/BUE support and posterior lean ?           Goal status:  MET ? ?4.  Pt will ambulate >200' w/ LRAD without rest to demonstrate improved

## 2022-04-25 ENCOUNTER — Ambulatory Visit: Payer: Medicare PPO | Attending: Internal Medicine | Admitting: Physical Therapy

## 2022-04-25 ENCOUNTER — Ambulatory Visit: Payer: Medicare PPO | Admitting: Podiatry

## 2022-04-25 ENCOUNTER — Encounter: Payer: Self-pay | Admitting: Podiatry

## 2022-04-25 ENCOUNTER — Encounter: Payer: Self-pay | Admitting: Physical Therapy

## 2022-04-25 VITALS — BP 165/73 | HR 52

## 2022-04-25 DIAGNOSIS — M5441 Lumbago with sciatica, right side: Secondary | ICD-10-CM | POA: Insufficient documentation

## 2022-04-25 DIAGNOSIS — G8929 Other chronic pain: Secondary | ICD-10-CM | POA: Insufficient documentation

## 2022-04-25 DIAGNOSIS — M6281 Muscle weakness (generalized): Secondary | ICD-10-CM | POA: Diagnosis not present

## 2022-04-25 DIAGNOSIS — R293 Abnormal posture: Secondary | ICD-10-CM | POA: Diagnosis not present

## 2022-04-25 DIAGNOSIS — B351 Tinea unguium: Secondary | ICD-10-CM | POA: Diagnosis not present

## 2022-04-25 DIAGNOSIS — R2681 Unsteadiness on feet: Secondary | ICD-10-CM | POA: Diagnosis not present

## 2022-04-25 DIAGNOSIS — M79674 Pain in right toe(s): Secondary | ICD-10-CM | POA: Diagnosis not present

## 2022-04-25 DIAGNOSIS — R2689 Other abnormalities of gait and mobility: Secondary | ICD-10-CM | POA: Insufficient documentation

## 2022-04-25 DIAGNOSIS — E119 Type 2 diabetes mellitus without complications: Secondary | ICD-10-CM | POA: Diagnosis not present

## 2022-04-25 DIAGNOSIS — M79675 Pain in left toe(s): Secondary | ICD-10-CM | POA: Diagnosis not present

## 2022-04-25 NOTE — Progress Notes (Signed)
?  Subjective:  ?Patient ID: Breanna [name], female    DOB: 01-04-1931,   MRN: [id number] ? ?No chief complaint on file. ? ? ?88 y.o. female presents for concern of thickened elongated and painful nails that are difficult to trim. Requesting to have them trimmed today. Denies any burning or tingling in her feet. Patient is diabetic and last A1c was 6.3  ? . Denies any other pedal complaints. Denies n/v/f/c.  ? ?Past Medical History:  ?Diagnosis Date  ? Adrenal adenoma 2006  ? Boils 2009  ? Cholelithiasis   ? asympt. w/normal HIDA 03/2010  ? CVA (cerebral infarction) 2010  ? Cerebellar  ? Diverticulitis   ? Esophageal spasm 2011  ? GERD (gastroesophageal reflux disease)   ? Gout   ? History of colon polyps   ? HTN (hypertension)   ? Hydronephrosis   ? LEFT/ Surgical intervention  ? Hyperlipidemia   ? LBP (low back pain)   ? Osteoarthritis   ? Pulmonary HTN ([place])   ? Stress   ? Type II or unspecified type diabetes mellitus without mention of complication, not stated as uncontrolled   ? ? ?Objective:  ?Physical Exam: ?Vascular: DP/PT pulses 2/4 bilateral. CFT <3 seconds. Normal hair growth on digits. No edema.  ?Skin. No lacerations or abrasions bilateral feet. Nails 1-5 are thickened discolored and elongated with subungual debris.  ?Hyperkeratotic lesion noted to the lateral right foot.  ?Musculoskeletal: MMT 5/5 bilateral lower extremities in DF, PF, Inversion and Eversion. Deceased ROM in DF of ankle joint.  ?Neurological: Sensation intact to light touch.  ? ?Assessment:  ? ?1. Diabetes mellitus without complication ([place])   ?2. Pain due to onychomycosis of toenails of both feet   ? ? ? ?Plan:  ?Patient was evaluated and treated and all questions answered. ?-Discussed and educated patient on diabetic foot care, especially with  ?regards to the vascular, neurological and musculoskeletal systems.  ?-Stressed the importance of good glycemic control and the detriment of not  ?controlling glucose levels in relation to the  foot. ?-Discussed supportive shoes at all times and checking feet regularly.  ?-Mechanically debrided all nails 1-5 bilateral using sterile nail nipper and filed with dremel without incident  ?-Answered all patient questions ?-Patient to return  in 3 months for at risk foot care ?-Patient advised to call the office if any problems or questions arise in the meantime. ? ? ?Lorenda Peck, DPM  ? ? ?

## 2022-04-25 NOTE — Therapy (Signed)
?OUTPATIENT PHYSICAL THERAPY TREATMENT NOTE- ARRIVED NO CHARGE ? ? ?Patient Name: Breanna [name] ?MRN: [id number] ?DOB:1931-12-20, 88 y.o., female ?Today's Date: 04/25/2022 ? ?PCP: Plotnikov, Evie Lacks, MD ?REFERRING PROVIDER: Cassandria Anger, MD ? ? PT End of Session - 04/25/22 1024   ? ? Visit Number 7   ? Number of Visits 11   10+eval  ? Date for PT Re-Evaluation 05/18/22   ? Authorization Type [employer] MC 2023 VL: [place]   ? Authorization Time Period 03/09/22 - 05/18/22   ? Authorization - Visit Number 5   ? Authorization - Number of Visits 11   ? Progress Note Due on Visit 10   ? PT Start Time 1018   ? PT Stop Time 1100   ? PT Time Calculation (min) 42 min   ? Activity Tolerance Patient tolerated treatment well   ? Behavior During Therapy [facility] for tasks assessed/performed   ? ?  ?  ? ?  ? ? ? ? ? ?Past Medical History:  ?Diagnosis Date  ? Adrenal adenoma 2006  ? Boils 2009  ? Cholelithiasis   ? asympt. w/normal HIDA 03/2010  ? CVA (cerebral infarction) 2010  ? Cerebellar  ? Diverticulitis   ? Esophageal spasm 2011  ? GERD (gastroesophageal reflux disease)   ? Gout   ? History of colon polyps   ? HTN (hypertension)   ? Hydronephrosis   ? LEFT/ Surgical intervention  ? Hyperlipidemia   ? LBP (low back pain)   ? Osteoarthritis   ? Pulmonary HTN ([place])   ? Stress   ? Type II or unspecified type diabetes mellitus without mention of complication, not stated as uncontrolled   ? ?Past Surgical History:  ?Procedure Laterality Date  ? ABDOMINAL HYSTERECTOMY    ? APPENDECTOMY    ? 2020  ? BACK SURGERY    ? BREAST BIOPSY    ? RIGHT  ? CATARACT EXTRACTION, BILATERAL    ? COLECTOMY  2006  ? Sigmoid  ? FOOT SURGERY    ? BILATERAL  ? HAMMER TOE SURGERY    ? INTRAMEDULLARY (IM) NAIL INTERTROCHANTERIC Right 07/21/2020  ? Procedure: INTRAMEDULLARY (IM) NAIL INTERTROCHANTRIC;  Surgeon: Renette Butters, MD;  Location: [facility];  Service: Orthopedics;  Laterality: Right;  ? ROTATOR CUFF REPAIR  2008  ? RIGHT  ? TOTAL KNEE ARTHROPLASTY  2003   ? LEFT  ? VARICOSE VEIN SURGERY    ? vein stripping/lower extremities  ? ?Patient Active Problem List  ? Diagnosis Date Noted  ? Gait disorder 03/07/2022  ? Falls frequently 11/29/2021  ? Vision loss 11/29/2021  ? Parkinsonian features 05/23/2021  ? Atherosclerosis of aorta ([place]) 05/23/2021  ? Anemia 08/18/2020  ? Constipation 08/18/2020  ? Hip fracture requiring operative repair ([place]) 07/21/2020  ? Sinus bradycardia 07/21/2020  ? Hip pain 07/06/2020  ? Fall against object 12/25/2018  ? Head contusion 12/25/2018  ? Acute pain of right knee 09/28/2018  ? Well adult exam 05/13/2018  ? Cold sore 05/13/2018  ? Night sweats 04/04/2018  ? Breast symptom 10/30/2017  ? Diarrhea 08/21/2017  ? UTI (urinary tract infection) 08/21/2017  ? Lung nodule 08/21/2017  ? FTT (failure to thrive) in adult 08/21/2017  ? Shortness of breath 08/13/2017  ? Cough 08/13/2017  ? Fatigue 08/13/2017  ? Fever 08/13/2017  ? Blurry vision 08/13/2017  ? Shoulder pain, right 07/25/2017  ? Callus of foot 06/07/2017  ? Need for prophylactic vaccination and inoculation against influenza 11/21/2016  ?  Trigger finger, acquired 11/12/2016  ? Meteorism 07/11/2016  ? Dark stools 01/10/2016  ? Osteoporosis 07/13/2015  ? Fall at home 01/13/2015  ? Cornea disorder 11/22/2014  ? Primary open angle glaucoma of both eyes, indeterminate stage 11/22/2014  ? Secondary corneal edema 10/14/2014  ? Swelling of left knee joint 04/07/2014  ? Grief 01/04/2014  ? Syncope 01/04/2014  ? Thoracic spine pain 10/22/2013  ? Hypernatremia 07/07/2013  ? Viral intestinal infection 03/25/2013  ? Dehydration, moderate 03/24/2013  ? Food poisoning 03/24/2013  ? YIRSWNIO(270.3) 12/22/2012  ? URI (upper respiratory infection) 10/21/2012  ? Diabetic neuropathy ([place]) 07/30/2012  ? Neck pain on right side 07/30/2012  ? Cerumen impaction 07/17/2012  ? Cystoid macular edema 06/19/2012  ? History of corneal transplant 06/19/2012  ? Weight loss 04/10/2012  ? Wrist pain, right 04/06/2011  ?  PARESTHESIA 01/03/2011  ? Full incontinence of feces 01/01/2011  ? ABDOMINAL PAIN RIGHT UPPER QUADRANT 01/01/2011  ? LEG PAIN, LEFT 10/02/2010  ? History of cardioembolic cerebrovascular accident (CVA) 03/27/2010  ? GERD 03/27/2010  ? DIVERTICULOSIS-COLON 03/27/2010  ? CHOLELITHIASIS 03/27/2010  ? LOW BACK PAIN 09/07/2009  ? ANEMIA, IRON DEFICIENCY, HX OF 06/14/2009  ? DYSPHAGIA 05/16/2009  ? Dysphagia 05/16/2009  ? Dyslipidemia 11/06/2007  ? Hyperlipidemia 11/06/2007  ? Type 2 diabetes mellitus with renal manifestations, controlled ([place]) 07/29/2007  ? Gout 07/29/2007  ? Essential hypertension 07/29/2007  ? Hydronephrosis 07/29/2007  ? Osteoarthritis 07/29/2007  ? COLONIC POLYPS, HX OF 07/29/2007  ? DIVERTICULITIS, HX OF 07/29/2007  ? ? ?REFERRING DIAG: M54.40,G89.29 (ICD-10-CM) - Chronic midline low back pain with sciatica, sciatica laterality unspecified R26.9 (ICD-10-CM) - Gait disorder  ? ?THERAPY DIAG:  ?Muscle weakness (generalized) ? ?Unsteadiness on feet ? ?Other abnormalities of gait and mobility ? ?Abnormal posture ? 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 ?PERTINENT HISTORY: OA, R intramedullary nail 07/21/2020, L TKA 2003, R RTC repair 2008, cerebellar CVA 2010, HTN, DM2 ? ?PRECAUTIONS: Fall, legally blind  ? ?SUBJECTIVE: Pt reports she took her meds this morning and ate a light breakfast. ? ? ?PAIN:  ?Are you having pain? No  ? ?  ?VITALS:  Left BP in sitting: ?Today's Vitals  ? 04/25/22 1023 04/25/22 1026  ?BP: (!) 178/76 (!) 165/73  ?Pulse: (!) 54 (!) 52  ? ?There is no height or weight on file to calculate BMI. ? ? ?OBJECTIVE:  ? ?TODAYS TREATMENT ?Pt ambulates into clinic w/ [facility] on Right side requiring HHA on left side to navigate gym x90' to reach EOM.  Pt unable to visualize mat outline until standing over EOM using free hand to reach out to find seat.  Directional cues provided throughout. ?Reviewed HEP to promote compliance w/ demo to son: ?Lower trunk rotations x10 ?Supine  march x20 ?Posterior pelvic tilt x10 ?Sit to stand w/ hands vs countertop x6 ?Seated scapular retractions x8 ?Seated chin tucks x10 ?Pt requires cues for form and frequent redirection to task. ? ? ? ? ?PATIENT EDUCATION: ?Education details: Extensive conversation about d/c plan and compliance to HEP.  Discussed help she has available at home.  There is one lady who comes in 4x/wk to clean the house for 2 hours.  Otherwise she states that she is alone unless her son is there by chance.  She explains bathroom setup and how she manages w/ decreased vision stating she is familiar with her setup and it remains unchanged, her bathroom is right off from her bedroom and she usually manages "just  fine" with transfers to and from standard toilet at home.  Discussed benefits of having additional care at home as pt further states she relies on use of pads for bladder management.  Reviewed risk for skin breakdown when wearing soiled pericare items for excessive lengths of time.  Pt states she is aware of risk and is able to independently manage pericare and proper wear schedule of these items.  Explained benefits of home health vs outpatient PT for functional mobility with pt and son.  Discussed difficulty w/ compliance to HEP as pt "cannot even see paper to read" and has no one available to review them with her or supervise her for safety.  Discussed review of HEP today w/ discharge from [facility] next session if pt unable to perform HEP due to vision complications and lack of supervision at home with subsequent recommendation for home health PT to assist with functional mobility in home environment as pt states she does not go into community much.  Pt and son in agreement to change in plan of care with son stating he will work on these activities with her prior to next session. ?Person educated: Patient  ?Education method: Explanation  ?Education comprehension: verbalized understanding and needs further education  ? ?HOME EXERCISE  PROGRAM  ?Access Code: [email or web address] ?URL: [email or web address] ?Date: 03/21/2022 ?Prepared by: Mickie Bail Plaster ? ?Exercises ?- Lower Trunk Rotations  - 1 x daily - 4 x weekly - 2 sets - 8 reps ?-

## 2022-04-30 ENCOUNTER — Ambulatory Visit: Payer: Medicare PPO | Admitting: Podiatry

## 2022-05-02 ENCOUNTER — Ambulatory Visit: Payer: Medicare PPO | Admitting: Physical Therapy

## 2022-05-04 ENCOUNTER — Other Ambulatory Visit: Payer: Self-pay | Admitting: Internal Medicine

## 2022-05-04 ENCOUNTER — Telehealth: Payer: Self-pay | Admitting: Internal Medicine

## 2022-05-09 ENCOUNTER — Ambulatory Visit: Payer: Medicare PPO | Admitting: Physical Therapy

## 2022-05-16 ENCOUNTER — Ambulatory Visit: Payer: Medicare PPO | Admitting: Physical Therapy

## 2022-05-23 ENCOUNTER — Ambulatory Visit: Payer: Medicare PPO | Admitting: Internal Medicine

## 2022-05-23 ENCOUNTER — Encounter: Payer: Self-pay | Admitting: Internal Medicine

## 2022-05-23 DIAGNOSIS — I10 Essential (primary) hypertension: Secondary | ICD-10-CM

## 2022-05-23 DIAGNOSIS — Y92009 Unspecified place in unspecified non-institutional (private) residence as the place of occurrence of the external cause: Secondary | ICD-10-CM

## 2022-05-23 DIAGNOSIS — W19XXXD Unspecified fall, subsequent encounter: Secondary | ICD-10-CM | POA: Diagnosis not present

## 2022-05-23 DIAGNOSIS — M7051 Other bursitis of knee, right knee: Secondary | ICD-10-CM | POA: Diagnosis not present

## 2022-05-23 DIAGNOSIS — M7052 Other bursitis of knee, left knee: Secondary | ICD-10-CM

## 2022-05-23 DIAGNOSIS — E1121 Type 2 diabetes mellitus with diabetic nephropathy: Secondary | ICD-10-CM

## 2022-05-23 NOTE — Assessment & Plan Note (Signed)
Steroid inj was offered and declined Use Ice, Blue-emu New shoes

## 2022-05-23 NOTE — Assessment & Plan Note (Signed)
Better BP Continue w/Coreg, Amlodipine, Losartan, Spironolactone

## 2022-05-23 NOTE — Assessment & Plan Note (Signed)
On Metformin 

## 2022-05-23 NOTE — Progress Notes (Signed)
Subjective:  Patient ID: Breanna [name], female    DOB: 19-Oct-1931  Age: 88 y.o. MRN: [id number]  CC: 6 week f/u (Concerns about her legs on the inner side of her knees, concerned that it may be arthritis. Noted some soreness. )   HPI [facility] presents for 6 week f/u (Concerns about her legs on the inner side of her knees, concerned that it may be arthritis. Noted some soreness. ) F/u HTN, DM, OA  Outpatient Medications Prior to Visit  Medication Sig Dispense Refill   acetaminophen (TYLENOL) 325 MG tablet Take 2 tablets (650 mg total) by mouth every 6 (six) hours as needed for moderate pain or fever.     Alcohol Swabs (TRUE COMFORT ALCOHOL PREP PADS) 70 % PADS Use as directed 300 each 1   allopurinol (ZYLOPRIM) 100 MG tablet Take 1 tablet (100 mg total) by mouth daily. 90 tablet 3   amLODipine (NORVASC) 5 MG tablet Take 1.5 tablets (7.5 mg total) by mouth daily. 45 tablet 11   ascorbic acid (VITAMIN C) 500 MG tablet TAKE 1 TABLET BY MOUTH EVERY DAY 30 tablet 11   ASPIRIN LOW DOSE 81 MG EC tablet TAKE 1 TABLET BY MOUTH DAILY. SWALLOW WHOLE. 30 tablet 11   atropine 1 % ophthalmic solution atropine 1 % eye drops     Blood Glucose Calibration (TRUE METRIX LEVEL 1) Low SOLN USE AS DIRECTED 3 each 3   calcium carbonate (OS-CAL) 600 MG tablet Take 1 tablet (600 mg total) by mouth daily. 90 tablet 3   calcium carbonate (OSCAL) 1500 (600 Ca) MG TABS tablet TAKE 1 TABLET BY MOUTH EVERY DAY 30 tablet 11   carbidopa-levodopa (SINEMET IR) 10-100 MG tablet Take 1 tablet by mouth in the morning. 90 tablet 11   carvedilol (COREG) 25 MG tablet Take 1 tablet (25 mg total) by mouth 2 (two) times daily with a meal. 180 tablet 3   Cholecalciferol (VITAMIN D3) 25 MCG (1000 UT) CAPS Take 1 capsule (1,000 Units total) by mouth daily. 90 capsule 3   diclofenac sodium (VOLTAREN) 1 % GEL Apply 2 g topically 2 (two) times daily. 200 g 3   docusate sodium (COLACE) 100 MG capsule TAKE 1 CAPSULE BY MOUTH TWICE A  DAY 60 capsule 11   dorzolamide-timolol (COSOPT) 22.3-6.8 MG/ML ophthalmic solution Place 1 drop into both eyes 2 (two) times daily.      feeding supplement, ENSURE ENLIVE, (ENSURE ENLIVE) LIQD Take 237 mLs by mouth daily at 2 PM. 237 mL 12   feeding supplement, GLUCERNA SHAKE, (GLUCERNA SHAKE) LIQD Take 237 mLs by mouth 2 (two) times daily between meals.  0   gabapentin (NEURONTIN) 100 MG capsule TAKE 1 CAPSULE BY MOUTH THREE TIMES A DAY NEED APPT 90 capsule 5   glucose blood (GNP TRUE METRIX GLUCOSE STRIPS) test strip Use to check blood sugars twice a day 200 each 2   latanoprost (XALATAN) 0.005 % ophthalmic solution Place 1 drop into both eyes at bedtime.      lidocaine-prilocaine (EMLA) cream Apply 1 application topically as needed. 30 g 0   losartan (COZAAR) 50 MG tablet Take 1 tablet (50 mg total) by mouth daily. 90 tablet 3   metFORMIN (GLUCOPHAGE) 500 MG tablet Take 1 tablet (500 mg total) by mouth daily with breakfast. Must keep scheduled appt for future refills 30 tablet 5   NON FORMULARY Place 10 drops under the tongue daily as needed (Pain). CBD oil  pantoprazole (PROTONIX) 40 MG tablet TAKE 1 TABLET BY MOUTH DAILY 30 tablet 11   polyethylene glycol (MIRALAX / GLYCOLAX) 17 g packet Take 17 g by mouth daily as needed for mild constipation. 14 each 0   senna (SENOKOT) 8.6 MG TABS tablet Take 1 tablet (8.6 mg total) by mouth 2 (two) times daily. 120 tablet 0   sertraline (ZOLOFT) 100 MG tablet TAKE 1 TABLET BY MOUTH DAILY 30 tablet 11   spironolactone (ALDACTONE) 25 MG tablet 1 tab by mouth mon - wed - fri 90 tablet 1   TRUEplus Lancets 33G MISC USE AS DIRECTED TO CHECK BLOOD SUGAR TWICE A DAY 200 each 1   valACYclovir (VALTREX) 500 MG tablet Take 1 tablet (500 mg total) by mouth 2 (two) times daily. 14 tablet 1   vitamin B-12 (CYANOCOBALAMIN) 1000 MCG tablet TAKE 1 TABLET (1,000 MCG TOTAL) BY MOUTH EVERY DAY 90 tablet 3   No facility-administered medications prior to visit.     ROS: Review of Systems  Constitutional:  Positive for fatigue. Negative for activity change, appetite change, chills and unexpected weight change.  HENT:  Negative for congestion, mouth sores and sinus pressure.   Eyes:  Negative for visual disturbance.  Respiratory:  Negative for cough and chest tightness.   Gastrointestinal:  Negative for abdominal pain and nausea.  Genitourinary:  Negative for difficulty urinating, frequency and vaginal pain.  Musculoskeletal:  Positive for arthralgias and gait problem. Negative for back pain.  Skin:  Negative for pallor and rash.  Neurological:  Negative for dizziness, tremors, weakness, numbness and headaches.  Psychiatric/Behavioral:  Negative for confusion, sleep disturbance and suicidal ideas.    Objective:  BP 130/78   Pulse (!) 57   Temp 97.9 F (36.6 C) (Oral)   Ht '5\' 4"'$  (1.626 m)   Wt 124 lb 3.2 oz (56.3 kg)   SpO2 97%   BMI 21.32 kg/m   BP Readings from Last 3 Encounters:  05/23/22 130/78  04/25/22 (!) 165/73  04/11/22 140/62    Wt Readings from Last 3 Encounters:  05/23/22 124 lb 3.2 oz (56.3 kg)  04/11/22 124 lb (56.2 kg)  03/07/22 127 lb (57.6 kg)    Physical Exam Constitutional:      General: She is not in acute distress.    Appearance: Normal appearance. She is well-developed.  HENT:     Head: Normocephalic.     Right Ear: External ear normal.     Left Ear: External ear normal.     Nose: Nose normal.  Eyes:     General:        Right eye: No discharge.        Left eye: No discharge.     Conjunctiva/sclera: Conjunctivae normal.     Pupils: Pupils are equal, round, and reactive to light.  Neck:     Thyroid: No thyromegaly.     Vascular: No JVD.     Trachea: No tracheal deviation.  Cardiovascular:     Rate and Rhythm: Normal rate and regular rhythm.     Heart sounds: Normal heart sounds.  Pulmonary:     Effort: No respiratory distress.     Breath sounds: No stridor. No wheezing.  Abdominal:      General: Bowel sounds are normal. There is no distension.     Palpations: Abdomen is soft. There is no mass.     Tenderness: There is no abdominal tenderness. There is no guarding or rebound.  Musculoskeletal:  General: No tenderness.     Cervical back: Normal range of motion and neck supple. No rigidity.  Lymphadenopathy:     Cervical: No cervical adenopathy.  Skin:    Findings: No erythema or rash.  Neurological:     Mental Status: She is oriented to person, place, and time.     Cranial Nerves: No cranial nerve deficit.     Motor: No abnormal muscle tone.     Coordination: Coordination normal.     Gait: Gait abnormal.     Deep Tendon Reflexes: Reflexes normal.  Psychiatric:        Behavior: Behavior normal.        Thought Content: Thought content normal.        Judgment: Judgment normal.  Arthritis changes Pes anserina w/pain B Using a cane  Lab Results  Component Value Date   WBC 4.9 03/07/2022   HGB 11.0 (L) 03/07/2022   HCT 34.6 (L) 03/07/2022   PLT 130.0 (L) 03/07/2022   GLUCOSE 94 03/07/2022   CHOL 155 07/28/2021   TRIG 78.0 07/28/2021   HDL 56.60 07/28/2021   LDLCALC 83 07/28/2021   ALT 9 03/07/2022   AST 19 03/07/2022   NA 143 03/07/2022   K 4.2 03/07/2022   CL 109 03/07/2022   CREATININE 1.15 03/07/2022   BUN 29 (H) 03/07/2022   CO2 28 03/07/2022   TSH 0.79 03/07/2022   HGBA1C 6.3 03/07/2022    MM 3D SCREEN BREAST BILATERAL  Result Date: 11/28/2021 CLINICAL DATA:  Screening. EXAM: DIGITAL SCREENING BILATERAL MAMMOGRAM WITH TOMOSYNTHESIS AND CAD TECHNIQUE: Bilateral screening digital craniocaudal and mediolateral oblique mammograms were obtained. Bilateral screening digital breast tomosynthesis was performed. The images were evaluated with computer-aided detection. COMPARISON:  Previous exam(s). ACR Breast Density Category b: There are scattered areas of fibroglandular density. FINDINGS: There are no findings suspicious for malignancy. IMPRESSION: No  mammographic evidence of malignancy. A result letter of this screening mammogram will be mailed directly to the patient. RECOMMENDATION: Screening mammogram in one year. (Code:SM-B-01Y) BI-RADS CATEGORY  1: Negative. Electronically Signed   By: Lovey Newcomer M.D.   On: 11/28/2021 16:37    Assessment & Plan:   Problem List Items Addressed This Visit     Essential hypertension    Better BP Continue w/Coreg, Amlodipine, Losartan, Spironolactone      Fall at home    Stronger after PT       Pes anserinus bursitis of both knees    Steroid inj was offered and declined Use Ice, Blue-emu New shoes       Type 2 diabetes mellitus with renal manifestations, controlled ([place])    On Metformin          No orders of the defined types were placed in this encounter.     Follow-up: No follow-ups on file.  Walker Kehr, MD

## 2022-05-23 NOTE — Patient Instructions (Signed)
Blue-Emu cream --use 2-3 times a day Use ice New no back sneakers for home  Spenco inserts?

## 2022-05-23 NOTE — Assessment & Plan Note (Signed)
Stronger after PT

## 2022-05-30 ENCOUNTER — Other Ambulatory Visit: Payer: Self-pay | Admitting: Internal Medicine

## 2022-05-30 NOTE — Telephone Encounter (Signed)
To pcp please 

## 2022-06-13 ENCOUNTER — Ambulatory Visit: Payer: Medicare PPO | Admitting: Internal Medicine

## 2022-07-17 ENCOUNTER — Telehealth: Payer: Self-pay

## 2022-07-17 MED ORDER — NIRMATRELVIR/RITONAVIR (PAXLOVID) TABLET (RENAL DOSING): 2.0000 | ORAL_TABLET | Freq: Two times a day (BID) | ORAL | 0 refills | Status: AC

## 2022-07-17 NOTE — Telephone Encounter (Signed)
Ok - paxlovid renal dose sent  Her med list does not have a statin, but if for some reason she is taking a statin, this should be held total 10 days

## 2022-07-17 NOTE — Telephone Encounter (Signed)
Pt has tested POS for COVID and is needing anti-viral and instructions on what meds not to take when taking meds. Pt has cough, congestion. Body aches and fever.  Pts son Breanna [name] [phone] Send meds over to [facility] on file please.

## 2022-07-17 NOTE — Telephone Encounter (Signed)
Called son no answer LMOM w/ MD response../lm,b

## 2022-07-23 ENCOUNTER — Other Ambulatory Visit: Payer: Self-pay | Admitting: Internal Medicine

## 2022-07-24 DIAGNOSIS — H401133 Primary open-angle glaucoma, bilateral, severe stage: Secondary | ICD-10-CM | POA: Diagnosis not present

## 2022-07-26 DIAGNOSIS — H401133 Primary open-angle glaucoma, bilateral, severe stage: Secondary | ICD-10-CM | POA: Diagnosis not present

## 2022-07-26 DIAGNOSIS — H35352 Cystoid macular degeneration, left eye: Secondary | ICD-10-CM | POA: Diagnosis not present

## 2022-07-26 DIAGNOSIS — Z947 Corneal transplant status: Secondary | ICD-10-CM | POA: Diagnosis not present

## 2022-07-26 DIAGNOSIS — Z961 Presence of intraocular lens: Secondary | ICD-10-CM | POA: Diagnosis not present

## 2022-07-27 ENCOUNTER — Other Ambulatory Visit: Payer: Self-pay | Admitting: Internal Medicine

## 2022-07-31 ENCOUNTER — Ambulatory Visit: Payer: Medicare PPO | Admitting: Podiatry

## 2022-07-31 ENCOUNTER — Encounter: Payer: Self-pay | Admitting: Podiatry

## 2022-07-31 DIAGNOSIS — E119 Type 2 diabetes mellitus without complications: Secondary | ICD-10-CM

## 2022-07-31 DIAGNOSIS — B351 Tinea unguium: Secondary | ICD-10-CM | POA: Diagnosis not present

## 2022-07-31 DIAGNOSIS — Q828 Other specified congenital malformations of skin: Secondary | ICD-10-CM | POA: Diagnosis not present

## 2022-07-31 DIAGNOSIS — M79675 Pain in left toe(s): Secondary | ICD-10-CM | POA: Diagnosis not present

## 2022-07-31 DIAGNOSIS — M79674 Pain in right toe(s): Secondary | ICD-10-CM | POA: Diagnosis not present

## 2022-08-01 ENCOUNTER — Ambulatory Visit: Payer: Medicare PPO | Admitting: Podiatry

## 2022-08-06 NOTE — Progress Notes (Signed)
  Subjective:  Patient ID: Breanna [name], female    DOB: [birth year],  MRN: [id number]  Breanna [name] presents to clinic today for preventative diabetic foot care and painful porokeratotic lesion(s) right lower extremity and painful mycotic toenails that limit ambulation. Painful toenails interfere with ambulation. Aggravating factors include wearing enclosed shoe gear. Pain is relieved with periodic professional debridement. Painful porokeratotic lesions are aggravated when weightbearing with and without shoegear. Pain is relieved with periodic professional debridement.  Last A1c was about 5%. Patient did not check blood glucose today.  New problem(s): None.   Her son, Breanna [name], is present during today's visit.  PCP is Plotnikov, Breanna Lacks, MD , and last visit was  May 23, 2022  Allergies  Allergen Reactions   Verapamil Shortness Of Breath    REACTION: SOB   Aspirin Itching    But tolerates low dose   Atenolol     REACTION: fatigue   Codeine Itching   Codeine Sulfate    Hydrochlorothiazide     REACTION: gout   Hydrocodone       side effects - hallucinations   Ibuprofen     Upset stomach w/high doses   Lisinopril     REACTION: cough   Onion Other (See Comments)    Dry mouth/ gets sores   Shellfish Allergy Swelling    Patient stated she does not eat shellfish, she swells up on different parts of the body   Valsartan Itching   Adhesive  [Tape] Rash   Other Rash    Review of Systems: Negative except as noted in the HPI.  Objective: No changes noted in today's physical examination. Objective:   Vascular Examination: Vascular status intact b/l with palpable pedal pulses. Pedal hair sparse b/l. CFT <3 seconds b/l. No edema. No pain with calf compression b/l. Skin temperature gradient WNL b/l.   Neurological Examination: Sensation grossly intact b/l with 10 gram monofilament. Vibratory sensation intact b/l.   Dermatological Examination: Pedal skin with normal turgor,  texture and tone b/l. Toenails bilateral great toes, left 2nd toe and 3-5 b/l thick, discolored, elongated with subungual debris and pain on dorsal palpation. Anonychia noted R 2nd toe. Nailbed(s) epithelialized.   Porokeratotic lesion(s) posterolateral aspect of right heel. No erythema, no edema, no drainage, no fluctuance.  Musculoskeletal Examination: Muscle strength 5/5 to b/l LE. Limited joint ROM to the bilateral ankles.  Radiographs: None  Last A1c:      Latest Ref Rng & Units 03/07/2022   10:58 AM 11/29/2021   11:34 AM  Hemoglobin A1C  Hemoglobin-A1c 4.6 - 6.5 % 6.3  6.3    Assessment/Plan: 1. Pain due to onychomycosis of toenails of both feet   2. Porokeratosis   3. Diabetes mellitus without complication ([place])   -Patient was evaluated and treated. All patient's and/or POA's questions/concerns answered on today's visit. -Patient's family member present. All questions/concerns addressed on today's visit. -Toenails bilateral great toes, 3-5 bilaterally, and L 2nd toe debrided in length and girth without iatrogenic bleeding with sterile nail nipper and dremel.  -Porokeratotic lesion(s) posterolateral aspect of right heel pared and enucleated with sterile scalpel blade without incident. Total number of lesions debrided=1. -Shoe recommendations given for [place]. -Patient/POA to call should there be question/concern in the interim.   Return in about 3 months (around 10/31/2022).  Marzetta Board, DPM

## 2022-08-08 ENCOUNTER — Ambulatory Visit: Payer: Medicare PPO | Admitting: Internal Medicine

## 2022-08-13 ENCOUNTER — Encounter: Payer: Self-pay | Admitting: Internal Medicine

## 2022-08-13 ENCOUNTER — Ambulatory Visit: Payer: Medicare PPO | Admitting: Internal Medicine

## 2022-08-13 DIAGNOSIS — R634 Abnormal weight loss: Secondary | ICD-10-CM | POA: Diagnosis not present

## 2022-08-13 DIAGNOSIS — I10 Essential (primary) hypertension: Secondary | ICD-10-CM | POA: Diagnosis not present

## 2022-08-13 DIAGNOSIS — E1121 Type 2 diabetes mellitus with diabetic nephropathy: Secondary | ICD-10-CM

## 2022-08-13 DIAGNOSIS — M1000 Idiopathic gout, unspecified site: Secondary | ICD-10-CM

## 2022-08-13 DIAGNOSIS — R627 Adult failure to thrive: Secondary | ICD-10-CM | POA: Diagnosis not present

## 2022-08-13 DIAGNOSIS — H401133 Primary open-angle glaucoma, bilateral, severe stage: Secondary | ICD-10-CM | POA: Diagnosis not present

## 2022-08-13 DIAGNOSIS — R269 Unspecified abnormalities of gait and mobility: Secondary | ICD-10-CM | POA: Diagnosis not present

## 2022-08-13 NOTE — Progress Notes (Signed)
Subjective:  Patient ID: Breanna [name], female    DOB: 1931-01-30  Age: 88 y.o. MRN: [id number]  CC: Follow-up (3 month f/u)   HPI [employer] presents for DM, blindness, gout, HTN  Outpatient Medications Prior to Visit  Medication Sig Dispense Refill   acetaminophen (TYLENOL) 325 MG tablet Take 2 tablets (650 mg total) by mouth every 6 (six) hours as needed for moderate pain or fever.     Alcohol Swabs (TRUE COMFORT ALCOHOL PREP PADS) 70 % PADS Use as directed 300 each 1   allopurinol (ZYLOPRIM) 100 MG tablet Take 1 tablet (100 mg total) by mouth daily. 90 tablet 3   allopurinol (ZYLOPRIM) 300 MG tablet Take 1 tablet by mouth daily.     amLODipine (NORVASC) 5 MG tablet Take 1 tablet by mouth daily.     ascorbic acid (VITAMIN C) 500 MG tablet TAKE 1 TABLET BY MOUTH EVERY DAY 30 tablet 11   ASPIRIN LOW DOSE 81 MG EC tablet TAKE 1 TABLET BY MOUTH DAILY. SWALLOW WHOLE. 30 tablet 11   atropine 1 % ophthalmic solution atropine 1 % eye drops     benzonatate (TESSALON) 200 MG capsule      Blood Glucose Calibration (TRUE METRIX LEVEL 1) Low SOLN USE AS DIRECTED 3 each 3   calcium carbonate (OS-CAL) 600 MG tablet Take 1 tablet (600 mg total) by mouth daily. 90 tablet 3   calcium carbonate (OSCAL) 1500 (600 Ca) MG TABS tablet TAKE 1 TABLET BY MOUTH EVERY DAY 30 tablet 11   carvedilol (COREG) 25 MG tablet Take 1 tablet (25 mg total) by mouth 2 (two) times daily with a meal. 180 tablet 3   cephALEXin (KEFLEX) 500 MG capsule Take 1 capsule by mouth 2 (two) times daily.     Cholecalciferol (VITAMIN D3) 25 MCG (1000 UT) CAPS Take 1 capsule (1,000 Units total) by mouth daily. 90 capsule 3   diclofenac sodium (VOLTAREN) 1 % GEL Apply 2 g topically 2 (two) times daily. 200 g 3   docusate sodium (COLACE) 100 MG capsule TAKE 1 CAPSULE BY MOUTH TWICE A DAY 180 capsule 1   dorzolamide-timolol (COSOPT) 22.3-6.8 MG/ML ophthalmic solution Place 1 drop into both eyes 2 (two) times daily.      feeding  supplement, ENSURE ENLIVE, (ENSURE ENLIVE) LIQD Take 237 mLs by mouth daily at 2 PM. 237 mL 12   feeding supplement, GLUCERNA SHAKE, (GLUCERNA SHAKE) LIQD Take 237 mLs by mouth 2 (two) times daily between meals.  0   gabapentin (NEURONTIN) 100 MG capsule TAKE 1 CAPSULE BY MOUTH THREE TIMES A DAY NEED APPT 90 capsule 5   glucose blood (GNP TRUE METRIX GLUCOSE STRIPS) test strip Use to check blood sugars twice a day 200 each 2   latanoprost (XALATAN) 0.005 % ophthalmic solution Place 1 drop into both eyes at bedtime.      lidocaine-prilocaine (EMLA) cream Apply 1 application topically as needed. 30 g 0   losartan (COZAAR) 25 MG tablet Take 2 tablets by mouth daily.     losartan (COZAAR) 50 MG tablet TAKE 1 TABLET BY MOUTH EVERY DAY 30 tablet 11   losartan (COZAAR) 50 MG tablet Take 1 tablet by mouth daily.     metFORMIN (GLUCOPHAGE) 500 MG tablet Take 1 tablet (500 mg total) by mouth daily with breakfast. Must keep scheduled appt for future refills 30 tablet 5   NON FORMULARY Place 10 drops under the tongue daily as needed (Pain). CBD oil  pantoprazole (PROTONIX) 40 MG tablet Take 1 tablet by mouth daily.     polyethylene glycol (MIRALAX / GLYCOLAX) 17 g packet Take 17 g by mouth daily as needed for mild constipation. 14 each 0   senna (SENOKOT) 8.6 MG TABS tablet Take 1 tablet (8.6 mg total) by mouth 2 (two) times daily. 120 tablet 0   sertraline (ZOLOFT) 100 MG tablet Take 1 tablet by mouth daily.     spironolactone (ALDACTONE) 25 MG tablet 1 tab by mouth mon - wed - fri 90 tablet 1   TRUEplus Lancets 33G MISC USE AS DIRECTED TO CHECK BLOOD SUGAR TWICE A DAY 200 each 1   valACYclovir (VALTREX) 500 MG tablet Take 1 tablet (500 mg total) by mouth 2 (two) times daily. 14 tablet 1   vitamin B-12 (CYANOCOBALAMIN) 1000 MCG tablet TAKE 1 TABLET (1,000 MCG TOTAL) BY MOUTH EVERY DAY 90 tablet 3   carbidopa-levodopa (SINEMET IR) 10-100 MG tablet Take 1 tablet by mouth in the morning. 90 tablet 11   No  facility-administered medications prior to visit.    ROS: Review of Systems  Constitutional:  Positive for unexpected weight change. Negative for activity change, appetite change, chills and fatigue.  HENT:  Negative for congestion, mouth sores and sinus pressure.   Eyes:  Positive for visual disturbance.  Respiratory:  Negative for cough and chest tightness.   Gastrointestinal:  Negative for abdominal pain and nausea.  Genitourinary:  Negative for difficulty urinating, frequency and vaginal pain.  Musculoskeletal:  Negative for back pain and gait problem.  Skin:  Negative for pallor and rash.  Neurological:  Negative for dizziness, tremors, weakness, numbness and headaches.  Psychiatric/Behavioral:  Negative for confusion and sleep disturbance. The patient is not nervous/anxious.     Objective:  BP (!) 164/78 (BP Location: Left Arm)   Pulse (!) 55   Temp 98.4 F (36.9 C) (Oral)   Ht '5\' 4"'$  (1.626 m)   Wt 119 lb 9.6 oz (54.3 kg)   SpO2 97%   BMI 20.53 kg/m   BP Readings from Last 3 Encounters:  08/13/22 (!) 164/78  05/23/22 130/78  04/25/22 (!) 165/73    Wt Readings from Last 3 Encounters:  08/13/22 119 lb 9.6 oz (54.3 kg)  05/23/22 124 lb 3.2 oz (56.3 kg)  04/11/22 124 lb (56.2 kg)    Physical Exam Constitutional:      General: She is not in acute distress.    Appearance: Normal appearance. She is well-developed.  HENT:     Head: Normocephalic.     Right Ear: External ear normal.     Left Ear: External ear normal.     Nose: Nose normal.  Eyes:     General:        Right eye: No discharge.        Left eye: No discharge.     Conjunctiva/sclera: Conjunctivae normal.     Pupils: Pupils are equal, round, and reactive to light.  Neck:     Thyroid: No thyromegaly.     Vascular: No JVD.     Trachea: No tracheal deviation.  Cardiovascular:     Rate and Rhythm: Normal rate and regular rhythm.     Heart sounds: Normal heart sounds.  Pulmonary:     Effort: No  respiratory distress.     Breath sounds: No stridor. No wheezing.  Abdominal:     General: Bowel sounds are normal. There is no distension.     Palpations: Abdomen is  soft. There is no mass.     Tenderness: There is no abdominal tenderness. There is no guarding or rebound.  Musculoskeletal:        General: No tenderness.     Cervical back: Normal range of motion and neck supple. No rigidity.  Lymphadenopathy:     Cervical: No cervical adenopathy.  Skin:    Findings: No erythema or rash.  Neurological:     Cranial Nerves: No cranial nerve deficit.     Motor: No abnormal muscle tone.     Coordination: Coordination normal.     Deep Tendon Reflexes: Reflexes normal.  Psychiatric:        Behavior: Behavior normal.        Thought Content: Thought content normal.        Judgment: Judgment normal.   Almost blind Using a cane  Lab Results  Component Value Date   WBC 4.9 03/07/2022   HGB 11.0 (L) 03/07/2022   HCT 34.6 (L) 03/07/2022   PLT 130.0 (L) 03/07/2022   GLUCOSE 94 03/07/2022   CHOL 155 07/28/2021   TRIG 78.0 07/28/2021   HDL 56.60 07/28/2021   LDLCALC 83 07/28/2021   ALT 9 03/07/2022   AST 19 03/07/2022   NA 143 03/07/2022   K 4.2 03/07/2022   CL 109 03/07/2022   CREATININE 1.15 03/07/2022   BUN 29 (H) 03/07/2022   CO2 28 03/07/2022   TSH 0.79 03/07/2022   HGBA1C 6.3 03/07/2022    MM 3D SCREEN BREAST BILATERAL  Result Date: 11/28/2021 CLINICAL DATA:  Screening. EXAM: DIGITAL SCREENING BILATERAL MAMMOGRAM WITH TOMOSYNTHESIS AND CAD TECHNIQUE: Bilateral screening digital craniocaudal and mediolateral oblique mammograms were obtained. Bilateral screening digital breast tomosynthesis was performed. The images were evaluated with computer-aided detection. COMPARISON:  Previous exam(s). ACR Breast Density Category b: There are scattered areas of fibroglandular density. FINDINGS: There are no findings suspicious for malignancy. IMPRESSION: No mammographic evidence of  malignancy. A result letter of this screening mammogram will be mailed directly to the patient. RECOMMENDATION: Screening mammogram in one year. (Code:SM-B-01Y) BI-RADS CATEGORY  1: Negative. Electronically Signed   By: Lovey Newcomer M.D.   On: 11/28/2021 16:37    Assessment & Plan:   Problem List Items Addressed This Visit     Essential hypertension    Continue w/Coreg, Amlodipine, Losartan, Spironolactone      FTT (failure to thrive) in adult    Vision loss - get a Architectural technologist disorder    Cane      Gout    No relapse      Type 2 diabetes mellitus with renal manifestations, controlled ([place])    On Metformin      Weight loss    Great appetite Boost prn          No orders of the defined types were placed in this encounter.     Follow-up: Return in about 3 months (around 11/13/2022) for a follow-up visit.  Walker Kehr, MD

## 2022-08-13 NOTE — Assessment & Plan Note (Signed)
On Metformin 

## 2022-08-13 NOTE — Assessment & Plan Note (Signed)
Continue w/Coreg, Amlodipine, Losartan, Spironolactone 

## 2022-08-13 NOTE — Patient Instructions (Signed)
Geographical information systems officer speaker?

## 2022-08-13 NOTE — Assessment & Plan Note (Signed)
No relapse 

## 2022-08-13 NOTE — Assessment & Plan Note (Signed)
Cane

## 2022-08-13 NOTE — Assessment & Plan Note (Signed)
Great appetite Boost prn

## 2022-08-13 NOTE — Assessment & Plan Note (Signed)
Vision loss - get a Engineer, water

## 2022-08-20 DIAGNOSIS — Z947 Corneal transplant status: Secondary | ICD-10-CM | POA: Diagnosis not present

## 2022-08-20 DIAGNOSIS — Z9889 Other specified postprocedural states: Secondary | ICD-10-CM | POA: Diagnosis not present

## 2022-08-20 DIAGNOSIS — Z961 Presence of intraocular lens: Secondary | ICD-10-CM | POA: Diagnosis not present

## 2022-08-20 DIAGNOSIS — H401133 Primary open-angle glaucoma, bilateral, severe stage: Secondary | ICD-10-CM | POA: Diagnosis not present

## 2022-08-22 ENCOUNTER — Other Ambulatory Visit: Payer: Self-pay | Admitting: Internal Medicine

## 2022-08-22 DIAGNOSIS — Z1231 Encounter for screening mammogram for malignant neoplasm of breast: Secondary | ICD-10-CM

## 2022-08-23 ENCOUNTER — Ambulatory Visit: Payer: Medicare PPO | Admitting: Internal Medicine

## 2022-08-24 ENCOUNTER — Other Ambulatory Visit (INDEPENDENT_AMBULATORY_CARE_PROVIDER_SITE_OTHER): Payer: Medicare PPO

## 2022-08-24 ENCOUNTER — Ambulatory Visit (INDEPENDENT_AMBULATORY_CARE_PROVIDER_SITE_OTHER): Payer: Medicare PPO

## 2022-08-24 DIAGNOSIS — R627 Adult failure to thrive: Secondary | ICD-10-CM | POA: Diagnosis not present

## 2022-08-24 DIAGNOSIS — R634 Abnormal weight loss: Secondary | ICD-10-CM

## 2022-08-24 DIAGNOSIS — E1121 Type 2 diabetes mellitus with diabetic nephropathy: Secondary | ICD-10-CM | POA: Diagnosis not present

## 2022-08-24 DIAGNOSIS — I10 Essential (primary) hypertension: Secondary | ICD-10-CM

## 2022-08-24 LAB — CBC WITH DIFFERENTIAL/PLATELET
Basophils Absolute: 0 10*3/uL (ref 0.0–0.1)
Basophils Relative: 0.6 % (ref 0.0–3.0)
Eosinophils Absolute: 0.1 10*3/uL (ref 0.0–0.7)
Eosinophils Relative: 3.7 % (ref 0.0–5.0)
HCT: 34 % — ABNORMAL LOW (ref 36.0–46.0)
Hemoglobin: 10.9 g/dL — ABNORMAL LOW (ref 12.0–15.0)
Lymphocytes Relative: 29.4 % (ref 12.0–46.0)
Lymphs Abs: 1 10*3/uL (ref 0.7–4.0)
MCHC: 32.1 g/dL (ref 30.0–36.0)
MCV: 89.2 fl (ref 78.0–100.0)
Monocytes Absolute: 0.5 10*3/uL (ref 0.1–1.0)
Monocytes Relative: 13 % — ABNORMAL HIGH (ref 3.0–12.0)
Neutro Abs: 1.9 10*3/uL (ref 1.4–7.7)
Neutrophils Relative %: 53.3 % (ref 43.0–77.0)
Platelets: 102 10*3/uL — ABNORMAL LOW (ref 150.0–400.0)
RBC: 3.81 Mil/uL — ABNORMAL LOW (ref 3.87–5.11)
RDW: 15.8 % — ABNORMAL HIGH (ref 11.5–15.5)
WBC: 3.6 10*3/uL — ABNORMAL LOW (ref 4.0–10.5)

## 2022-08-24 LAB — COMPREHENSIVE METABOLIC PANEL
ALT: 9 U/L (ref 0–35)
AST: 18 U/L (ref 0–37)
Albumin: 3.6 g/dL (ref 3.5–5.2)
Alkaline Phosphatase: 98 U/L (ref 39–117)
BUN: 33 mg/dL — ABNORMAL HIGH (ref 6–23)
CO2: 30 mEq/L (ref 19–32)
Calcium: 9.8 mg/dL (ref 8.4–10.5)
Chloride: 105 mEq/L (ref 96–112)
Creatinine, Ser: 1.5 mg/dL — ABNORMAL HIGH (ref 0.40–1.20)
GFR: 30.82 mL/min — ABNORMAL LOW (ref >60.00)
Glucose, Bld: 107 mg/dL — ABNORMAL HIGH (ref 70–99)
Potassium: 4.5 mEq/L (ref 3.5–5.1)
Sodium: 141 mEq/L (ref 135–145)
Total Bilirubin: 0.3 mg/dL (ref 0.2–1.2)
Total Protein: 7.1 g/dL (ref 6.0–8.3)

## 2022-08-24 LAB — HEMOGLOBIN A1C: Hgb A1c MFr Bld: 6.3 % (ref 4.6–6.5)

## 2022-08-24 LAB — TSH: TSH: 0.64 u[IU]/mL (ref 0.35–5.50)

## 2022-08-30 ENCOUNTER — Other Ambulatory Visit: Payer: Self-pay | Admitting: Internal Medicine

## 2022-09-29 ENCOUNTER — Other Ambulatory Visit: Payer: Self-pay | Admitting: Internal Medicine

## 2022-10-23 ENCOUNTER — Ambulatory Visit: Payer: Self-pay | Admitting: Licensed Clinical Social Worker

## 2022-10-23 NOTE — Patient Instructions (Signed)
Visit Information  Thank you for taking time to visit with me today. Please don't hesitate to contact me if I can be of assistance to you before our next scheduled telephone appointment.  Following are the goals we discussed today:   Our next appointment is by telephone on 11/12/22 at 2:30 PM   Please call the care guide team at [phone] if you need to cancel or reschedule your appointment.   If you are experiencing a Mental Health or [place] or need someone to talk to, please go to [facility] [place] [phone])   Following is a copy of your full plan of care:   Intervention: Called client phone number today but was not able to speak via phone with client. Left phone message for client asking her to call LCSW at [phone].  Ms. Eccleston was given information about Care Management services by the embedded care coordination team including:  Care Management services include personalized support from designated clinical staff supervised by her physician, including individualized plan of care and coordination with other care providers 24/7 contact phone numbers for assistance for urgent and routine care needs. The patient may stop CCM services at any time (effective at the end of the month) by phone call to the office staff.  Patient agreed to services and verbal consent obtained.   Norva Riffle.Damione Robideau MSW, [place] representative [facility] Care Management [phone]

## 2022-10-23 NOTE — Patient Outreach (Signed)
  Care Coordination   10/23/2022 Name: Breanna [name] MRN: [id number] DOB: 01-Jul-1931   Care Coordination Outreach Attempts:  An unsuccessful telephone outreach was attempted today to offer the patient information about available care coordination services as a benefit of their health plan.   Follow Up Plan:  Additional outreach attempts will be made to offer the patient care coordination information and services.   Encounter Outcome:  No Answer  Care Coordination Interventions Activated:  No   Care Coordination Interventions:  No, not indicated    Norva Riffle.Rasheida Broden MSW, [place] representative [facility] Care Management [phone]

## 2022-11-05 ENCOUNTER — Telehealth: Payer: Self-pay

## 2022-11-05 ENCOUNTER — Ambulatory Visit (INDEPENDENT_AMBULATORY_CARE_PROVIDER_SITE_OTHER): Payer: Medicare PPO

## 2022-11-05 VITALS — Ht 64.0 in

## 2022-11-05 DIAGNOSIS — Z0000 Encounter for general adult medical examination without abnormal findings: Secondary | ICD-10-CM | POA: Diagnosis not present

## 2022-11-05 NOTE — Patient Instructions (Signed)
Breanna [name] , Thank you for taking time to come for your Medicare Wellness Visit. I appreciate your ongoing commitment to your health goals. Please review the following plan we discussed and let me know if I can assist you in the future.   These are the goals we discussed:  Goals      LCSW called client phone today but did not speak with client. Left phone message for client asking her to call LCSW at [phone]     Intervention: Called client phone number today but was not able to speak via phone with client. Left phone message for client asking her to call LCSW at [phone].        This is a list of the screening recommended for you and due dates:  Health Maintenance  Topic Date Due   Zoster (Shingles) Vaccine (1 of 2) Never done   Complete foot exam   07/08/2019   COVID-19 Vaccine (5 - Pfizer risk series) 01/09/2022   Eye exam for diabetics  07/17/2022   Flu Shot  07/24/2022   Hemoglobin A1C  02/22/2023   Medicare Annual Wellness Visit  11/06/2023   Tetanus Vaccine  05/13/2028   Pneumonia Vaccine  Completed   DEXA scan (bone density measurement)  Completed   HPV Vaccine  Aged Out    Advanced directives: Yes  Conditions/risks identified: Yes  Next appointment: Follow up in one year for your annual wellness visit.   Preventive Care 86 Years and Older, Female Preventive care refers to lifestyle choices and visits with your health care provider that can promote health and wellness. What does preventive care include? A yearly physical exam. This is also called an annual well check. Dental exams once or twice a year. Routine eye exams. Ask your health care provider how often you should have your eyes checked. Personal lifestyle choices, including: Daily care of your teeth and gums. Regular physical activity. Eating a healthy diet. Avoiding tobacco and drug use. Limiting alcohol use. Practicing safe sex. Taking low-dose aspirin every day. Taking vitamin and mineral  supplements as recommended by your health care provider. What happens during an annual well check? The services and screenings done by your health care provider during your annual well check will depend on your age, overall health, lifestyle risk factors, and family history of disease. Counseling  Your health care provider may ask you questions about your: Alcohol use. Tobacco use. Drug use. Emotional well-being. Home and relationship well-being. Sexual activity. Eating habits. History of falls. Memory and ability to understand (cognition). Work and work Statistician. Reproductive health. Screening  You may have the following tests or measurements: Height, weight, and BMI. Blood pressure. Lipid and cholesterol levels. These may be checked every 5 years, or more frequently if you are over 35 years old. Skin check. Lung cancer screening. You may have this screening every year starting at age 86 if you have a 30-pack-year history of smoking and currently smoke or have quit within the past 15 years. Fecal occult blood test (FOBT) of the stool. You may have this test every year starting at age 86. Flexible sigmoidoscopy or colonoscopy. You may have a sigmoidoscopy every 5 years or a colonoscopy every 10 years starting at age 86. Hepatitis C blood test. Hepatitis B blood test. Sexually transmitted disease (STD) testing. Diabetes screening. This is done by checking your blood sugar (glucose) after you have not eaten for a while (fasting). You may have this done every 1-3 years. Bone density scan. This  is done to screen for osteoporosis. You may have this done starting at age 86. Mammogram. This may be done every 1-2 years. Talk to your health care provider about how often you should have regular mammograms. Talk with your health care provider about your test results, treatment options, and if necessary, the need for more tests. Vaccines  Your health care provider may recommend certain  vaccines, such as: Influenza vaccine. This is recommended every year. Tetanus, diphtheria, and acellular pertussis (Tdap, Td) vaccine. You may need a Td booster every 10 years. Zoster vaccine. You may need this after age 86. Pneumococcal 13-valent conjugate (PCV13) vaccine. One dose is recommended after age 86. Pneumococcal polysaccharide (PPSV23) vaccine. One dose is recommended after age 86. Talk to your health care provider about which screenings and vaccines you need and how often you need them. This information is not intended to replace advice given to you by your health care provider. Make sure you discuss any questions you have with your health care provider. Document Released: 01/06/2016 Document Revised: 08/29/2016 Document Reviewed: 10/11/2015 Elsevier Interactive Patient Education  2017 [place] Prevention in the Home Falls can cause injuries. They can happen to people of all ages. There are many things you can do to make your home safe and to help prevent falls. What can I do on the outside of my home? Regularly fix the edges of walkways and driveways and fix any cracks. Remove anything that might make you trip as you walk through a door, such as a raised step or threshold. Trim any bushes or trees on the path to your home. Use bright outdoor lighting. Clear any walking paths of anything that might make someone trip, such as rocks or tools. Regularly check to see if handrails are loose or broken. Make sure that both sides of any steps have handrails. Any raised decks and porches should have guardrails on the edges. Have any leaves, snow, or ice cleared regularly. Use sand or salt on walking paths during winter. Clean up any spills in your garage right away. This includes oil or grease spills. What can I do in the bathroom? Use night lights. Install grab bars by the toilet and in the tub and shower. Do not use towel bars as grab bars. Use non-skid mats or decals in  the tub or shower. If you need to sit down in the shower, use a plastic, non-slip stool. Keep the floor dry. Clean up any water that spills on the floor as soon as it happens. Remove soap buildup in the tub or shower regularly. Attach bath mats securely with double-sided non-slip rug tape. Do not have throw rugs and other things on the floor that can make you trip. What can I do in the bedroom? Use night lights. Make sure that you have a light by your bed that is easy to reach. Do not use any sheets or blankets that are too big for your bed. They should not hang down onto the floor. Have a firm chair that has side arms. You can use this for support while you get dressed. Do not have throw rugs and other things on the floor that can make you trip. What can I do in the kitchen? Clean up any spills right away. Avoid walking on wet floors. Keep items that you use a lot in easy-to-reach places. If you need to reach something above you, use a strong step stool that has a grab bar. Keep electrical cords out  of the way. Do not use floor polish or wax that makes floors slippery. If you must use wax, use non-skid floor wax. Do not have throw rugs and other things on the floor that can make you trip. What can I do with my stairs? Do not leave any items on the stairs. Make sure that there are handrails on both sides of the stairs and use them. Fix handrails that are broken or loose. Make sure that handrails are as long as the stairways. Check any carpeting to make sure that it is firmly attached to the stairs. Fix any carpet that is loose or worn. Avoid having throw rugs at the top or bottom of the stairs. If you do have throw rugs, attach them to the floor with carpet tape. Make sure that you have a light switch at the top of the stairs and the bottom of the stairs. If you do not have them, ask someone to add them for you. What else can I do to help prevent falls? Wear shoes that: Do not have high  heels. Have rubber bottoms. Are comfortable and fit you well. Are closed at the toe. Do not wear sandals. If you use a stepladder: Make sure that it is fully opened. Do not climb a closed stepladder. Make sure that both sides of the stepladder are locked into place. Ask someone to hold it for you, if possible. Clearly mark and make sure that you can see: Any grab bars or handrails. First and last steps. Where the edge of each step is. Use tools that help you move around (mobility aids) if they are needed. These include: Canes. Walkers. Scooters. Crutches. Turn on the lights when you go into a dark area. Replace any light bulbs as soon as they burn out. Set up your furniture so you have a clear path. Avoid moving your furniture around. If any of your floors are uneven, fix them. If there are any pets around you, be aware of where they are. Review your medicines with your doctor. Some medicines can make you feel dizzy. This can increase your chance of falling. Ask your doctor what other things that you can do to help prevent falls. This information is not intended to replace advice given to you by your health care provider. Make sure you discuss any questions you have with your health care provider. Document Released: 10/06/2009 Document Revised: 05/17/2016 Document Reviewed: 01/14/2015 Elsevier Interactive Patient Education  2017 [employer].

## 2022-11-05 NOTE — Progress Notes (Cosign Needed Addendum)
Virtual Visit via Telephone Note  I connected with  Breanna [name] on 11/05/22 at  1:00 PM EST by telephone and verified that I am speaking with the correct person using two identifiers.  Location: Patient: Home Provider: [place] Persons participating in the virtual visit: [place]   I discussed the limitations, risks, security and privacy concerns of performing an evaluation and management service by telephone and the availability of in person appointments. The patient expressed understanding and agreed to proceed.  Interactive audio and video telecommunications were attempted between this nurse and patient, however failed, due to patient having technical difficulties OR patient did not have access to video capability.  We continued and completed visit with audio only.  Some vital signs may be absent or patient reported.   Sheral Flow, LPN  Subjective:   Breanna [name] is a 88 y.o. female who presents for Medicare Annual (Subsequent) preventive examination.  Review of Systems     Cardiac Risk Factors include: advanced age (>46mn, >>58women);diabetes mellitus;dyslipidemia;hypertension;family history of premature cardiovascular disease     Objective:    Today's Vitals   11/05/22 1302  Height: '5\' 4"'$  (1.626 m)   Body mass index is 20.53 kg/m.     11/05/2022    1:19 PM 03/09/2022    9:40 AM 11/03/2021    9:32 AM 07/21/2020    4:15 AM 07/20/2020    8:56 PM 07/13/2020    8:47 AM 07/10/2019    8:38 AM  Advanced Directives  Does Patient Have a Medical Advance Directive? Yes Yes No No No Yes Yes  Type of AP[place]amed[place]of [place] will [place] will    Living will;Healthcare Power of [place]  Does patient want to make changes to medical advance directive?  No - Patient declined    No - Guardian declined   Copy of HSte. Genevievein  Chart? No - copy requested     No - copy requested No - copy requested  Would patient like information on creating a medical advance directive?   No - Patient declined No - Patient declined       Current Medications (verified) Outpatient Encounter Medications as of 11/05/2022  Medication Sig   acetaminophen (TYLENOL) 325 MG tablet Take 2 tablets (650 mg total) by mouth every 6 (six) hours as needed for moderate pain or fever.   Alcohol Swabs (TRUE COMFORT ALCOHOL PREP PADS) 70 % PADS Use as directed   allopurinol (ZYLOPRIM) 100 MG tablet Take 1 tablet (100 mg total) by mouth daily.   allopurinol (ZYLOPRIM) 300 MG tablet Take 1 tablet by mouth daily.   amLODipine (NORVASC) 5 MG tablet Take 1 tablet by mouth daily.   ascorbic acid (VITAMIN C) 500 MG tablet TAKE 1 TABLET BY MOUTH EVERY DAY   ASPIRIN LOW DOSE 81 MG tablet TAKE 1 TABLET BY MOUTH DAILY. SWALLOW WHOLE.   atropine 1 % ophthalmic solution atropine 1 % eye drops   benzonatate (TESSALON) 200 MG capsule    Blood Glucose Calibration (TRUE METRIX LEVEL 1) Low SOLN USE AS DIRECTED   calcium carbonate (OS-CAL) 600 MG tablet Take 1 tablet (600 mg total) by mouth daily.   calcium carbonate (OSCAL) 1500 (600 Ca) MG TABS tablet TAKE 1 TABLET BY MOUTH EVERY DAY   carbidopa-levodopa (SINEMET IR) 10-100 MG tablet Take 1 tablet by mouth in the morning.   carvedilol (COREG) 25 MG tablet  Take 1 tablet (25 mg total) by mouth 2 (two) times daily with a meal.   cephALEXin (KEFLEX) 500 MG capsule Take 1 capsule by mouth 2 (two) times daily.   Cholecalciferol (VITAMIN D3) 25 MCG (1000 UT) CAPS Take 1 capsule (1,000 Units total) by mouth daily.   diclofenac sodium (VOLTAREN) 1 % GEL Apply 2 g topically 2 (two) times daily.   docusate sodium (COLACE) 100 MG capsule TAKE 1 CAPSULE BY MOUTH TWICE A DAY   dorzolamide-timolol (COSOPT) 22.3-6.8 MG/ML ophthalmic solution Place 1 drop into both eyes 2 (two) times daily.    feeding supplement, ENSURE ENLIVE,  (ENSURE ENLIVE) LIQD Take 237 mLs by mouth daily at 2 PM.   feeding supplement, GLUCERNA SHAKE, (GLUCERNA SHAKE) LIQD Take 237 mLs by mouth 2 (two) times daily between meals.   gabapentin (NEURONTIN) 100 MG capsule TAKE 1 CAPSULE BY MOUTH THREE TIMES A DAY NEED APPT   glucose blood (GNP TRUE METRIX GLUCOSE STRIPS) test strip Use to check blood sugars twice a day   latanoprost (XALATAN) 0.005 % ophthalmic solution Place 1 drop into both eyes at bedtime.    lidocaine-prilocaine (EMLA) cream Apply 1 application topically as needed.   losartan (COZAAR) 25 MG tablet Take 2 tablets by mouth daily.   losartan (COZAAR) 50 MG tablet TAKE 1 TABLET BY MOUTH EVERY DAY   losartan (COZAAR) 50 MG tablet Take 1 tablet by mouth daily.   metFORMIN (GLUCOPHAGE) 500 MG tablet Take 1 tablet (500 mg total) by mouth daily with breakfast. Must keep scheduled appt for future refills   NON FORMULARY Place 10 drops under the tongue daily as needed (Pain). CBD oil   pantoprazole (PROTONIX) 40 MG tablet Take 1 tablet by mouth daily.   polyethylene glycol (MIRALAX / GLYCOLAX) 17 g packet Take 17 g by mouth daily as needed for mild constipation.   senna (SENOKOT) 8.6 MG TABS tablet Take 1 tablet (8.6 mg total) by mouth 2 (two) times daily.   sertraline (ZOLOFT) 100 MG tablet Take 1 tablet by mouth daily.   spironolactone (ALDACTONE) 25 MG tablet 1 tab by mouth mon - wed - fri   TRUEplus Lancets 33G MISC USE AS DIRECTED TO CHECK BLOOD SUGAR TWICE A DAY   valACYclovir (VALTREX) 500 MG tablet Take 1 tablet (500 mg total) by mouth 2 (two) times daily.   vitamin B-12 (CYANOCOBALAMIN) 1000 MCG tablet TAKE 1 TABLET (1,000 MCG TOTAL) BY MOUTH EVERY DAY   No facility-administered encounter medications on file as of 11/05/2022.    Allergies (verified) Verapamil, Aspirin, Atenolol, Codeine, Codeine sulfate, Hydrochlorothiazide, Hydrocodone, Ibuprofen, Lisinopril, Onion, Shellfish allergy, Valsartan, Adhesive  [tape], and Other    History: Past Medical History:  Diagnosis Date   Adrenal adenoma 2006   Boils 2009   Cholelithiasis    asympt. w/normal HIDA 03/2010   CVA (cerebral infarction) 2010   Cerebellar   Diverticulitis    Esophageal spasm 2011   GERD (gastroesophageal reflux disease)    Gout    History of colon polyps    HTN (hypertension)    Hydronephrosis    LEFT/ Surgical intervention   Hyperlipidemia    LBP (low back pain)    Osteoarthritis    Pulmonary HTN (HCC)    Stress    Type II or unspecified type diabetes mellitus without mention of complication, not stated as uncontrolled    Past Surgical History:  Procedure Laterality Date   ABDOMINAL HYSTERECTOMY     APPENDECTOMY  2020   BACK SURGERY     BREAST BIOPSY     RIGHT   CATARACT EXTRACTION, BILATERAL     COLECTOMY  2006   Sigmoid   FOOT SURGERY     BILATERAL   HAMMER TOE SURGERY     INTRAMEDULLARY (IM) NAIL INTERTROCHANTERIC Right 07/21/2020   Procedure: INTRAMEDULLARY (IM) NAIL INTERTROCHANTRIC;  Surgeon: Renette Butters, MD;  Location: [facility];  Service: Orthopedics;  Laterality: Right;   ROTATOR CUFF REPAIR  2008   RIGHT   TOTAL KNEE ARTHROPLASTY  2003   LEFT   VARICOSE VEIN SURGERY     vein stripping/lower extremities   Family History  Problem Relation Age of Onset   Prostate cancer Father    Hypertension Father    Cancer Father        prostate   Heart disease Mother    Diabetes Mother    Diabetes Other        1st degree relative   Heart disease Other    Hypertension Other    Prostate cancer Maternal Uncle    Cancer Maternal Uncle        prostate   Breast cancer Daughter    Cancer Daughter        breast   Colon cancer Neg Hx    Social History   Socioeconomic History   Marital status: Widowed    Spouse name: Marbella Markgraf   Number of children: 1   Years of education: Not on file   Highest education level: Not on file  Occupational History   Occupation: Retired    Fish farm manager: RETIRED  Tobacco Use    Smoking status: Never   Smokeless tobacco: Never  Vaping Use   Vaping Use: Never used  Substance and Sexual Activity   Alcohol use: No    Alcohol/week: 0.0 standard drinks of alcohol   Drug use: No   Sexual activity: Not Currently  Other Topics Concern   Not on file  Social History Narrative   Not on file   Social Determinants of Health   Financial Resource Strain: Low Risk  (11/05/2022)   Overall Financial Resource Strain (CARDIA)    Difficulty of Paying Living Expenses: Not hard at all  Food Insecurity: No Food Insecurity (11/05/2022)   Hunger Vital Sign    Worried About Running Out of Food in the Last Year: Never true    [place] in the Last Year: Never true  Transportation Needs: No Transportation Needs (11/05/2022)   PRAPARE - Hydrologist (Medical): No    Lack of Transportation (Non-Medical): No  Physical Activity: Inactive (11/05/2022)   Exercise Vital Sign    Days of Exercise per Week: 0 days    Minutes of Exercise per Session: 0 min  Stress: No Stress Concern Present (11/05/2022)   [place]    Feeling of Stress : Not at all  Social Connections: Moderately Isolated (11/05/2022)   Social Connection and Isolation Panel [NHANES]    Frequency of Communication with Friends and Family: Twice a week    Frequency of Social Gatherings with Friends and Family: Twice a week    Attends Religious Services: 1 to 4 times per year    Active Member of [employer] or Organizations: No    Attends Archivist Meetings: Never    Marital Status: Widowed    Tobacco Counseling Counseling given: Not Answered   Clinical Intake:  Pre-visit preparation completed: Yes  Pain : No/denies pain     BMI - recorded: 20.52 (08/13/2022) Nutritional Status: BMI of 19-24  Normal Nutritional Risks: None Diabetes: Yes CBG done?: No Did pt. bring in CBG monitor from home?: No  How  often do you need to have someone help you when you read instructions, pamphlets, or other written materials from your doctor or pharmacy?: 1 - Never What is the last grade level you completed in school?: HSG  Nutrition Risk Assessment:  Has the patient had any N/V/D within the last 2 months?  No  Does the patient have any non-healing wounds?  No  Has the patient had any unintentional weight loss or weight gain?  No   Diabetes:  Is the patient diabetic?  Yes  If diabetic, was a CBG obtained today?  No  Did the patient bring in their glucometer from home?  No  How often do you monitor your CBG's? GNP True Metrix.   Financial Strains and Diabetes Management:  Are you having any financial strains with the device, your supplies or your medication? No .  Does the patient want to be seen by Chronic Care Management for management of their diabetes?  No  Would the patient like to be referred to a Nutritionist or for Diabetic Management?  No   Diabetic Exams:  Diabetic Eye Exam: Completed 08/20/2022 Diabetic Foot Exam: Overdue, Pt has been advised about the importance in completing this exam. Pt is scheduled for diabetic foot exam on 11/13/2022.   Interpreter Needed?: No  Information entered by :: Lisette Abu, LPN.   Activities of Daily Living    11/05/2022    1:21 PM  In your present state of health, do you have any difficulty performing the following activities:  Hearing? 0  Vision? 1  Difficulty concentrating or making decisions? 0  Walking or climbing stairs? 0  Dressing or bathing? 0  Doing errands, shopping? 1  Preparing Food and eating ? N  Using the Toilet? N  In the past six months, have you accidently leaked urine? Y  Comment wears Pullups  Do you have problems with loss of bowel control? Y  Managing your Medications? Y  Managing your Finances? N  Housekeeping or managing your Housekeeping? Y    Patient Care Team: Plotnikov, Evie Lacks, MD as PCP - General  (Internal Medicine) Carolan Clines, MD (Inactive) as Consulting Physician (Urology) Melina Schools, MD as Consulting Physician (Orthopedic Surgery) Teofilo Pod, MD as Referring Physician (Ophthalmology) Marilynne Halsted, MD as Referring Physician (Ophthalmology) Marzetta Board, DPM as Consulting Physician (Podiatry)  Indicate any recent Medical Services you may have received from other than [facility] providers in the past year (date may be approximate).     Assessment:   This is a routine wellness examination for Jacora.  Hearing/Vision screen Hearing Screening - Comments:: Denies hearing difficulties   Vision Screening - Comments:: Patient is legally blind.  Eye exams done by Dr. Raynelle Fanning and Dr. Gretta Cool  Dietary issues and exercise activities discussed: Current Exercise Habits: The patient does not participate in regular exercise at present, Exercise limited by: orthopedic condition(s)   Goals Addressed   None   Depression Screen    11/05/2022    1:09 PM 11/03/2021    9:35 AM 11/03/2021    9:30 AM 07/28/2021   10:36 AM 07/13/2020    8:43 AM 07/10/2019    8:38 AM 07/07/2018    8:41 AM  PHQ 2/9 Scores  PHQ - 2 Score 0 0 0 0 0 0 0    Fall Risk    11/05/2022    1:06 PM 04/11/2022    1:39 PM 11/03/2021    9:34 AM 07/28/2021   10:33 AM 07/13/2020    8:47 AM  Fall Risk   Falls in the past year? 0 0 0 1 0  Number falls in past yr: 0 0 1 0 0  Injury with Fall? 0 0 0 0 0  Risk for fall due to : No Fall Risks Impaired vision Impaired balance/gait  No Fall Risks  Follow up Falls prevention discussed Falls evaluation completed Falls evaluation completed  Falls evaluation completed    [place]:  Any stairs in or around the home? No  If so, are there any without handrails? No  Home free of loose throw rugs in walkways, pet beds, electrical cords, etc? Yes  Adequate lighting in your home to reduce risk of falls? Yes    ASSISTIVE DEVICES UTILIZED TO PREVENT FALLS:  Life alert? No  Use of a cane, walker or w/c? Yes  Grab bars in the bathroom? Yes  Shower chair or bench in shower? Yes  Elevated toilet seat or a handicapped toilet? Yes   TIMED UP AND GO:  Was the test performed? No . Phone Visit  Cognitive Function:    07/07/2018    8:59 AM 07/04/2017   11:52 AM 05/30/2015    9:20 AM  MMSE - Mini Mental State Exam  Not completed:   Unable to complete  Orientation to time 5 5   Orientation to Place 5 5   Registration 3 3   Attention/ Calculation 4 4   Recall 1 2   Language- name 2 objects 2 2   Language- repeat 1 1   Language- follow 3 step command 3 3   Language- read & follow direction 1 1   Write a sentence 1 1   Copy design 1 1   Total score 27 28         11/05/2022    1:22 PM  6CIT Screen  What Year? 0 points  What month? 0 points  What time? 0 points  Count back from 20 0 points  Months in reverse 0 points  Repeat phrase 0 points  Total Score 0 points    Immunizations Immunization History  Administered Date(s) Administered   Fluad Quad(high Dose 65+) 09/16/2019, 10/20/2020   H1N1 01/06/2009   Influenza Split 10/01/2011, 09/08/2012   Influenza Whole 10/07/2009, 09/29/2010   Influenza, High Dose Seasonal PF 10/06/2013, 11/21/2016, 09/25/2017, 09/22/2018, 11/08/2021   Influenza-Unspecified 10/09/2014, 09/09/2015   Moderna Covid-19 Vaccine Bivalent Booster 25yr & up 11/14/2021   PFIZER(Purple Top)SARS-COV-2 Vaccination 01/12/2020, 02/02/2020, 09/26/2020   Pneumococcal Conjugate-13 04/07/2014   Pneumococcal Polysaccharide-23 06/26/2007, 03/14/2018   Td 06/26/2007   Tdap 05/13/2018   Zoster, Live 10/04/2011    TDAP status: Up to date  Flu Vaccine status: Due, Education has been provided regarding the importance of this vaccine. Advised may receive this vaccine at local pharmacy or Health Dept. Aware to provide a copy of the vaccination record if obtained from local  pharmacy or Health Dept. Verbalized acceptance and understanding.  Pneumococcal vaccine status: Up to date  Covid-19 vaccine status: Completed vaccines  Qualifies for Shingles Vaccine? Yes   Zostavax completed Yes   Shingrix Completed?: No.    Education has been provided regarding the importance of this vaccine. Patient has  been advised to call insurance company to determine out of pocket expense if they have not yet received this vaccine. Advised may also receive vaccine at local pharmacy or Health Dept. Verbalized acceptance and understanding.  Screening Tests Health Maintenance  Topic Date Due   Zoster Vaccines- Shingrix (1 of 2) Never done   FOOT EXAM  07/08/2019   COVID-19 Vaccine (5 - Pfizer risk series) 01/09/2022   OPHTHALMOLOGY EXAM  07/17/2022   INFLUENZA VACCINE  07/24/2022   HEMOGLOBIN A1C  02/22/2023   Medicare Annual Wellness (AWV)  11/06/2023   TETANUS/TDAP  05/13/2028   Pneumonia Vaccine 52+ Years old  Completed   DEXA SCAN  Completed   HPV VACCINES  Aged Out    Health Maintenance  Health Maintenance Due  Topic Date Due   Zoster Vaccines- Shingrix (1 of 2) Never done   FOOT EXAM  07/08/2019   COVID-19 Vaccine (5 - Pfizer risk series) 01/09/2022   OPHTHALMOLOGY EXAM  07/17/2022   INFLUENZA VACCINE  07/24/2022    Colorectal cancer screening: No longer required.   Mammogram status: Completed 11/28/2021. Repeat every year  Bone Density status: Completed 06/15/2015. Results reflect: Bone density results: OSTEOPOROSIS. Repeat every 2 years.  Lung Cancer Screening: (Low Dose CT Chest recommended if Age 52-80 years, 30 pack-year currently smoking OR have quit w/in 15years.) does not qualify.   Lung Cancer Screening Referral: no  Additional Screening:  Hepatitis C Screening: does not qualify; Completed no  Vision Screening: Recommended annual ophthalmology exams for early detection of glaucoma and other disorders of the eye. Is the patient up to date with  their annual eye exam?  Yes  Who is the provider or what is the name of the office in which the patient attends annual eye exams? Gretta Cool, MD. If pt is not established with a provider, would they like to be referred to a provider to establish care? No .   Dental Screening: Recommended annual dental exams for proper oral hygiene  Community Resource Referral / Chronic Care Management: CRR required this visit?  No   CCM required this visit?  No      Plan:     I have personally reviewed and noted the following in the patient's chart:   Medical and social history Use of alcohol, tobacco or illicit drugs  Current medications and supplements including opioid prescriptions. Patient is not currently taking opioid prescriptions. Functional ability and status Nutritional status Physical activity Advanced directives List of other physicians Hospitalizations, surgeries, and ER visits in previous 12 months Vitals Screenings to include cognitive, depression, and falls Referrals and appointments  In addition, I have reviewed and discussed with patient certain preventive protocols, quality metrics, and best practice recommendations. A written personalized care plan for preventive services as well as general preventive health recommendations were provided to patient.     Sheral Flow, LPN   00/92/3300   Nurse Notes: N/A   Medical screening examination/treatment/procedure(s) were performed by non-physician practitioner and as supervising physician I was immediately available for consultation/collaboration.  I agree with above. Lew Dawes, MD

## 2022-11-05 NOTE — Telephone Encounter (Signed)
Patient stated that she has boils on her private area and would like a rx to be sent to [facility] at [address].  Stated that symptoms started this past Saturday.  Mignon Pine, LPN.

## 2022-11-06 MED ORDER — DOXYCYCLINE HYCLATE 100 MG PO TABS: 100.0000 mg | ORAL_TABLET | Freq: Two times a day (BID) | ORAL | 0 refills | Status: DC

## 2022-11-06 NOTE — Telephone Encounter (Signed)
I will send a prescription for doxycycline. She may need to see a gynecologist. Thanks

## 2022-11-06 NOTE — Addendum Note (Signed)
Addended by: Cassandria Anger on: 11/06/2022 07:52 AM   Modules accepted: Orders

## 2022-11-11 ENCOUNTER — Other Ambulatory Visit: Payer: Self-pay | Admitting: Internal Medicine

## 2022-11-13 ENCOUNTER — Ambulatory Visit: Payer: Medicare PPO | Admitting: Internal Medicine

## 2022-11-13 ENCOUNTER — Ambulatory Visit: Payer: Medicare PPO | Admitting: Podiatry

## 2022-11-13 ENCOUNTER — Encounter: Payer: Self-pay | Admitting: Internal Medicine

## 2022-11-13 VITALS — BP 130/85 | HR 52 | Temp 98.7°F | Ht 64.0 in | Wt 119.2 lb

## 2022-11-13 DIAGNOSIS — R29818 Other symptoms and signs involving the nervous system: Secondary | ICD-10-CM

## 2022-11-13 DIAGNOSIS — M1000 Idiopathic gout, unspecified site: Secondary | ICD-10-CM

## 2022-11-13 DIAGNOSIS — I10 Essential (primary) hypertension: Secondary | ICD-10-CM | POA: Diagnosis not present

## 2022-11-13 DIAGNOSIS — G8929 Other chronic pain: Secondary | ICD-10-CM | POA: Diagnosis not present

## 2022-11-13 DIAGNOSIS — E1121 Type 2 diabetes mellitus with diabetic nephropathy: Secondary | ICD-10-CM

## 2022-11-13 DIAGNOSIS — R269 Unspecified abnormalities of gait and mobility: Secondary | ICD-10-CM | POA: Diagnosis not present

## 2022-11-13 DIAGNOSIS — M5441 Lumbago with sciatica, right side: Secondary | ICD-10-CM | POA: Diagnosis not present

## 2022-11-13 DIAGNOSIS — H547 Unspecified visual loss: Secondary | ICD-10-CM | POA: Diagnosis not present

## 2022-11-13 LAB — COMPREHENSIVE METABOLIC PANEL
ALT: 11 U/L (ref 0–35)
AST: 21 U/L (ref 0–37)
Albumin: 3.9 g/dL (ref 3.5–5.2)
Alkaline Phosphatase: 107 U/L (ref 39–117)
BUN: 42 mg/dL — ABNORMAL HIGH (ref 6–23)
CO2: 29 mEq/L (ref 19–32)
Calcium: 9.7 mg/dL (ref 8.4–10.5)
Chloride: 104 mEq/L (ref 96–112)
Creatinine, Ser: 1.33 mg/dL — ABNORMAL HIGH (ref 0.40–1.20)
GFR: 35.55 mL/min — ABNORMAL LOW (ref >60.00)
Glucose, Bld: 89 mg/dL (ref 70–99)
Potassium: 4.2 mEq/L (ref 3.5–5.1)
Sodium: 139 mEq/L (ref 135–145)
Total Bilirubin: 0.5 mg/dL (ref 0.2–1.2)
Total Protein: 7.4 g/dL (ref 6.0–8.3)

## 2022-11-13 LAB — HEMOGLOBIN A1C: Hgb A1c MFr Bld: 6.4 % (ref 4.6–6.5)

## 2022-11-13 NOTE — Assessment & Plan Note (Signed)
Chronic OA/spinal stenosis Dr Rolena Infante

## 2022-11-13 NOTE — Assessment & Plan Note (Signed)
On Metformin

## 2022-11-13 NOTE — Assessment & Plan Note (Signed)
Stable No worse

## 2022-11-13 NOTE — Assessment & Plan Note (Signed)
Will reduce Allopurinol from 300 mg/d to 100 mg/d due to CRI

## 2022-11-13 NOTE — Assessment & Plan Note (Signed)
Discussed.

## 2022-11-13 NOTE — Progress Notes (Signed)
Subjective:  Patient ID: Breanna [name], female    DOB: Jul 16, 1931  Age: 88 y.o. MRN: [id number]  CC: Follow-up (3  MONTH F/U)   HPI [employer] presents for DM, HTN, gout  Outpatient Medications Prior to Visit  Medication Sig Dispense Refill   acetaminophen (TYLENOL) 325 MG tablet Take 2 tablets (650 mg total) by mouth every 6 (six) hours as needed for moderate pain or fever.     Alcohol Swabs (TRUE COMFORT ALCOHOL PREP PADS) 70 % PADS Use as directed 300 each 1   allopurinol (ZYLOPRIM) 100 MG tablet Take 1 tablet (100 mg total) by mouth daily. 90 tablet 3   allopurinol (ZYLOPRIM) 300 MG tablet Take 1 tablet by mouth daily.     amLODipine (NORVASC) 5 MG tablet Take 1 tablet by mouth daily.     ascorbic acid (VITAMIN C) 500 MG tablet TAKE 1 TABLET BY MOUTH EVERY DAY 30 tablet 11   ASPIRIN LOW DOSE 81 MG tablet TAKE 1 TABLET BY MOUTH DAILY. SWALLOW WHOLE. 90 tablet 3   atropine 1 % ophthalmic solution atropine 1 % eye drops     benzonatate (TESSALON) 200 MG capsule      Blood Glucose Calibration (TRUE METRIX LEVEL 1) Low SOLN USE AS DIRECTED 3 each 3   calcium carbonate (OS-CAL) 600 MG tablet Take 1 tablet (600 mg total) by mouth daily. 90 tablet 3   calcium carbonate (OSCAL) 1500 (600 Ca) MG TABS tablet TAKE 1 TABLET BY MOUTH EVERY DAY 30 tablet 11   carvedilol (COREG) 25 MG tablet Take 1 tablet (25 mg total) by mouth 2 (two) times daily with a meal. 180 tablet 3   cephALEXin (KEFLEX) 500 MG capsule Take 1 capsule by mouth 2 (two) times daily.     Cholecalciferol (VITAMIN D3) 25 MCG (1000 UT) CAPS Take 1 capsule (1,000 Units total) by mouth daily. 90 capsule 3   diclofenac sodium (VOLTAREN) 1 % GEL Apply 2 g topically 2 (two) times daily. 200 g 3   docusate sodium (COLACE) 100 MG capsule TAKE 1 CAPSULE BY MOUTH TWICE A DAY 180 capsule 1   dorzolamide-timolol (COSOPT) 22.3-6.8 MG/ML ophthalmic solution Place 1 drop into both eyes 2 (two) times daily.      doxycycline (VIBRA-TABS)  100 MG tablet Take 1 tablet (100 mg total) by mouth 2 (two) times daily. 20 tablet 0   feeding supplement, ENSURE ENLIVE, (ENSURE ENLIVE) LIQD Take 237 mLs by mouth daily at 2 PM. 237 mL 12   feeding supplement, GLUCERNA SHAKE, (GLUCERNA SHAKE) LIQD Take 237 mLs by mouth 2 (two) times daily between meals.  0   gabapentin (NEURONTIN) 100 MG capsule TAKE 1 CAPSULE BY MOUTH THREE TIMES A DAY NEED APPT 90 capsule 5   glucose blood (GNP TRUE METRIX GLUCOSE STRIPS) test strip Use to check blood sugars twice a day 200 each 2   latanoprost (XALATAN) 0.005 % ophthalmic solution Place 1 drop into both eyes at bedtime.      lidocaine-prilocaine (EMLA) cream Apply 1 application topically as needed. 30 g 0   losartan (COZAAR) 25 MG tablet Take 2 tablets by mouth daily.     losartan (COZAAR) 50 MG tablet TAKE 1 TABLET BY MOUTH EVERY DAY 30 tablet 11   losartan (COZAAR) 50 MG tablet Take 1 tablet by mouth daily.     metFORMIN (GLUCOPHAGE) 500 MG tablet Take 1 tablet (500 mg total) by mouth daily with breakfast. Must keep scheduled appt for  future refills 90 tablet 1   NON FORMULARY Place 10 drops under the tongue daily as needed (Pain). CBD oil     pantoprazole (PROTONIX) 40 MG tablet Take 1 tablet by mouth daily.     polyethylene glycol (MIRALAX / GLYCOLAX) 17 g packet Take 17 g by mouth daily as needed for mild constipation. 14 each 0   senna (SENOKOT) 8.6 MG TABS tablet Take 1 tablet (8.6 mg total) by mouth 2 (two) times daily. 120 tablet 0   sertraline (ZOLOFT) 100 MG tablet TAKE 1 TABLET BY MOUTH EVERY DAY 90 tablet 1   spironolactone (ALDACTONE) 25 MG tablet TAKE 1 TAB BY MOUTH MON - WED - FRI 90 tablet 1   TRUEplus Lancets 33G MISC USE AS DIRECTED TO CHECK BLOOD SUGAR TWICE A DAY 200 each 1   valACYclovir (VALTREX) 500 MG tablet Take 1 tablet (500 mg total) by mouth 2 (two) times daily. 14 tablet 1   vitamin B-12 (CYANOCOBALAMIN) 1000 MCG tablet TAKE 1 TABLET (1,000 MCG TOTAL) BY MOUTH EVERY DAY 90 tablet  3   carbidopa-levodopa (SINEMET IR) 10-100 MG tablet Take 1 tablet by mouth in the morning. 90 tablet 11   No facility-administered medications prior to visit.    ROS: Review of Systems  Constitutional:  Negative for activity change, appetite change, chills, fatigue and unexpected weight change.  HENT:  Negative for congestion, mouth sores and sinus pressure.   Eyes:  Negative for visual disturbance.  Respiratory:  Negative for cough and chest tightness.   Gastrointestinal:  Negative for abdominal pain and nausea.  Genitourinary:  Negative for difficulty urinating, frequency and vaginal pain.  Musculoskeletal:  Positive for arthralgias and gait problem. Negative for back pain.  Skin:  Negative for pallor and rash.  Neurological:  Negative for dizziness, tremors, weakness, numbness and headaches.  Psychiatric/Behavioral:  Positive for dysphoric mood. Negative for confusion, sleep disturbance and suicidal ideas. The patient is not nervous/anxious.     Objective:  BP 130/85   Pulse (!) 52   Temp 98.7 F (37.1 C) (Oral)   Ht '5\' 4"'$  (1.626 m)   Wt 119 lb 3.2 oz (54.1 kg)   SpO2 98%   BMI 20.46 kg/m   BP Readings from Last 3 Encounters:  11/13/22 130/85  08/13/22 (!) 164/78  05/23/22 130/78    Wt Readings from Last 3 Encounters:  11/13/22 119 lb 3.2 oz (54.1 kg)  08/13/22 119 lb 9.6 oz (54.3 kg)  05/23/22 124 lb 3.2 oz (56.3 kg)    Physical Exam Constitutional:      General: She is not in acute distress.    Appearance: She is well-developed. She is obese.  HENT:     Head: Normocephalic.     Right Ear: External ear normal.     Left Ear: External ear normal.     Nose: Nose normal.  Eyes:     General:        Right eye: No discharge.        Left eye: No discharge.     Conjunctiva/sclera: Conjunctivae normal.     Pupils: Pupils are equal, round, and reactive to light.  Neck:     Thyroid: No thyromegaly.     Vascular: No JVD.     Trachea: No tracheal deviation.   Cardiovascular:     Rate and Rhythm: Normal rate and regular rhythm.     Heart sounds: Normal heart sounds.  Pulmonary:     Effort: No respiratory distress.  Breath sounds: No stridor. No wheezing.  Abdominal:     General: Bowel sounds are normal. There is no distension.     Palpations: Abdomen is soft. There is no mass.     Tenderness: There is no abdominal tenderness. There is no guarding or rebound.  Musculoskeletal:        General: Tenderness present.     Cervical back: Normal range of motion and neck supple. No rigidity.  Lymphadenopathy:     Cervical: No cervical adenopathy.  Skin:    Findings: No erythema or rash.  Neurological:     Mental Status: She is oriented to person, place, and time.     Cranial Nerves: No cranial nerve deficit.     Motor: Weakness present. No abnormal muscle tone.     Coordination: Coordination normal.     Gait: Gait abnormal.     Deep Tendon Reflexes: Reflexes normal.  Psychiatric:        Behavior: Behavior normal.        Thought Content: Thought content normal.        Judgment: Judgment normal.     Lab Results  Component Value Date   WBC 3.6 (L) 08/24/2022   HGB 10.9 (L) 08/24/2022   HCT 34.0 (L) 08/24/2022   PLT 102.0 (L) 08/24/2022   GLUCOSE 107 (H) 08/24/2022   CHOL 155 07/28/2021   TRIG 78.0 07/28/2021   HDL 56.60 07/28/2021   LDLCALC 83 07/28/2021   ALT 9 08/24/2022   AST 18 08/24/2022   NA 141 08/24/2022   K 4.5 08/24/2022   CL 105 08/24/2022   CREATININE 1.50 (H) 08/24/2022   BUN 33 (H) 08/24/2022   CO2 30 08/24/2022   TSH 0.64 08/24/2022   HGBA1C 6.3 08/24/2022    MM 3D SCREEN BREAST BILATERAL  Result Date: 11/28/2021 CLINICAL DATA:  Screening. EXAM: DIGITAL SCREENING BILATERAL MAMMOGRAM WITH TOMOSYNTHESIS AND CAD TECHNIQUE: Bilateral screening digital craniocaudal and mediolateral oblique mammograms were obtained. Bilateral screening digital breast tomosynthesis was performed. The images were evaluated with  computer-aided detection. COMPARISON:  Previous exam(s). ACR Breast Density Category b: There are scattered areas of fibroglandular density. FINDINGS: There are no findings suspicious for malignancy. IMPRESSION: No mammographic evidence of malignancy. A result letter of this screening mammogram will be mailed directly to the patient. RECOMMENDATION: Screening mammogram in one year. (Code:SM-B-01Y) BI-RADS CATEGORY  1: Negative. Electronically Signed   By: Lovey Newcomer M.D.   On: 11/28/2021 16:37    Assessment & Plan:   Problem List Items Addressed This Visit     Type 2 diabetes mellitus with renal manifestations, controlled ([place]) - Primary    On Metformin      Relevant Orders   Comprehensive metabolic panel   Hemoglobin A1c   Gout    Will reduce Allopurinol from 300 mg/d to 100 mg/d due to CRI      Essential hypertension    Continue w/Coreg, Amlodipine, Losartan, Spironolactone Rechecked BP is nl      LOW BACK PAIN    Chronic OA/spinal stenosis Dr Rolena Infante      Parkinsonian features    Stable No worse      Vision loss    Discussed       Gait disorder    Using a cane         No orders of the defined types were placed in this encounter.     Follow-up: Return in about 3 months (around 02/13/2023) for a follow-up visit.  Walker Kehr, MD

## 2022-11-13 NOTE — Assessment & Plan Note (Signed)
Continue w/Coreg, Amlodipine, Losartan, Spironolactone Rechecked BP is nl

## 2022-11-13 NOTE — Assessment & Plan Note (Signed)
Using a cane 

## 2022-11-28 DIAGNOSIS — H401133 Primary open-angle glaucoma, bilateral, severe stage: Secondary | ICD-10-CM | POA: Diagnosis not present

## 2022-11-28 DIAGNOSIS — Z947 Corneal transplant status: Secondary | ICD-10-CM | POA: Diagnosis not present

## 2022-11-28 DIAGNOSIS — Z961 Presence of intraocular lens: Secondary | ICD-10-CM | POA: Diagnosis not present

## 2022-11-29 ENCOUNTER — Ambulatory Visit
Admission: RE | Admit: 2022-11-29 | Discharge: 2022-11-29 | Disposition: A | Discharge status: Home / Self Care | Payer: Medicare PPO | Source: Ambulatory Visit | Attending: Internal Medicine | Admitting: Internal Medicine

## 2022-11-29 DIAGNOSIS — Z1231 Encounter for screening mammogram for malignant neoplasm of breast: Secondary | ICD-10-CM | POA: Diagnosis not present

## 2022-12-14 ENCOUNTER — Other Ambulatory Visit: Payer: Self-pay | Admitting: Internal Medicine

## 2023-01-16 DIAGNOSIS — H401133 Primary open-angle glaucoma, bilateral, severe stage: Secondary | ICD-10-CM | POA: Diagnosis not present

## 2023-01-21 ENCOUNTER — Telehealth: Payer: Self-pay | Admitting: Internal Medicine

## 2023-01-21 NOTE — Telephone Encounter (Signed)
error 

## 2023-01-23 ENCOUNTER — Ambulatory Visit: Payer: Medicare PPO | Admitting: Internal Medicine

## 2023-01-23 ENCOUNTER — Encounter: Payer: Self-pay | Admitting: Internal Medicine

## 2023-01-23 VITALS — BP 120/72 | HR 55 | Temp 99.0°F | Ht 64.0 in | Wt 116.0 lb

## 2023-01-23 DIAGNOSIS — I10 Essential (primary) hypertension: Secondary | ICD-10-CM

## 2023-01-23 DIAGNOSIS — E1121 Type 2 diabetes mellitus with diabetic nephropathy: Secondary | ICD-10-CM | POA: Diagnosis not present

## 2023-01-23 DIAGNOSIS — E1142 Type 2 diabetes mellitus with diabetic polyneuropathy: Secondary | ICD-10-CM

## 2023-01-23 MED ORDER — GABAPENTIN 300 MG PO CAPS: ORAL_CAPSULE | ORAL | 5 refills | Status: DC

## 2023-01-23 NOTE — Assessment & Plan Note (Signed)
Chronic On Metformin 

## 2023-01-23 NOTE — Assessment & Plan Note (Addendum)
Worse Start Gabapentin 300 mg tid prn (am, supper and 3 am if needed) Blue-Emu cream was recommended to use 2-3 times a day

## 2023-01-23 NOTE — Assessment & Plan Note (Signed)
Continue w/Coreg, Amlodipine, Losartan, Spironolactone

## 2023-01-23 NOTE — Progress Notes (Signed)
Subjective:  Patient ID: Breanna [name], female    DOB: [birth year]  Age: 89 y.o. MRN: [id number]  CC: Tingling (In legs and feet for about 3 months , and numbness only when she is laying down and keeps her from sleeping )   HPI [facility] presents for leg pains, feet burning, cold - worse when in bed. Walking is ok. Pain is 8/10 early in am up to the knees.  Outpatient Medications Prior to Visit  Medication Sig Dispense Refill   acetaminophen (TYLENOL) 325 MG tablet Take 2 tablets (650 mg total) by mouth every 6 (six) hours as needed for moderate pain or fever.     Alcohol Swabs (TRUE COMFORT ALCOHOL PREP PADS) 70 % PADS Use as directed 300 each 1   allopurinol (ZYLOPRIM) 100 MG tablet Take 1 tablet (100 mg total) by mouth daily. 90 tablet 3   allopurinol (ZYLOPRIM) 300 MG tablet Take 1 tablet by mouth daily.     amLODipine (NORVASC) 5 MG tablet Take 1 tablet by mouth daily.     ascorbic acid (VITAMIN C) 500 MG tablet TAKE 1 TABLET BY MOUTH EVERY DAY 30 tablet 11   ASPIRIN LOW DOSE 81 MG tablet TAKE 1 TABLET BY MOUTH DAILY. SWALLOW WHOLE. 90 tablet 3   atropine 1 % ophthalmic solution atropine 1 % eye drops     benzonatate (TESSALON) 200 MG capsule      Blood Glucose Calibration (TRUE METRIX LEVEL 1) Low SOLN USE AS DIRECTED 3 each 3   calcium carbonate (OS-CAL) 600 MG tablet Take 1 tablet (600 mg total) by mouth daily. 90 tablet 3   calcium carbonate (OSCAL) 1500 (600 Ca) MG TABS tablet TAKE 1 TABLET BY MOUTH EVERY DAY 30 tablet 11   carvedilol (COREG) 25 MG tablet Take 1 tablet (25 mg total) by mouth 2 (two) times daily with a meal. 180 tablet 3   cephALEXin (KEFLEX) 500 MG capsule Take 1 capsule by mouth 2 (two) times daily.     Cholecalciferol (VITAMIN D3) 25 MCG (1000 UT) CAPS Take 1 capsule (1,000 Units total) by mouth daily. 90 capsule 3   diclofenac sodium (VOLTAREN) 1 % GEL Apply 2 g topically 2 (two) times daily. 200 g 3   docusate sodium (COLACE) 100 MG capsule TAKE 1  CAPSULE BY MOUTH TWICE A DAY 180 capsule 1   dorzolamide-timolol (COSOPT) 22.3-6.8 MG/ML ophthalmic solution Place 1 drop into both eyes 2 (two) times daily.      feeding supplement, ENSURE ENLIVE, (ENSURE ENLIVE) LIQD Take 237 mLs by mouth daily at 2 PM. 237 mL 12   feeding supplement, GLUCERNA SHAKE, (GLUCERNA SHAKE) LIQD Take 237 mLs by mouth 2 (two) times daily between meals.  0   glucose blood (GNP TRUE METRIX GLUCOSE STRIPS) test strip Use to check blood sugars twice a day 200 each 2   latanoprost (XALATAN) 0.005 % ophthalmic solution Place 1 drop into both eyes at bedtime.      lidocaine-prilocaine (EMLA) cream Apply 1 application topically as needed. 30 g 0   losartan (COZAAR) 25 MG tablet Take 2 tablets by mouth daily.     losartan (COZAAR) 50 MG tablet TAKE 1 TABLET BY MOUTH EVERY DAY 30 tablet 11   metFORMIN (GLUCOPHAGE) 500 MG tablet Take 1 tablet (500 mg total) by mouth daily with breakfast. Must keep scheduled appt for future refills 90 tablet 1   NON FORMULARY Place 10 drops under the tongue daily as needed (Pain).  CBD oil     pantoprazole (PROTONIX) 40 MG tablet TAKE 1 TABLET BY MOUTH EVERY DAY 90 tablet 3   polyethylene glycol (MIRALAX / GLYCOLAX) 17 g packet Take 17 g by mouth daily as needed for mild constipation. 14 each 0   senna (SENOKOT) 8.6 MG TABS tablet Take 1 tablet (8.6 mg total) by mouth 2 (two) times daily. 120 tablet 0   sertraline (ZOLOFT) 100 MG tablet TAKE 1 TABLET BY MOUTH EVERY DAY 90 tablet 1   spironolactone (ALDACTONE) 25 MG tablet TAKE 1 TAB BY MOUTH MON - WED - FRI 90 tablet 1   TRUEplus Lancets 33G MISC USE AS DIRECTED TO CHECK BLOOD SUGAR TWICE A DAY 200 each 1   valACYclovir (VALTREX) 500 MG tablet Take 1 tablet (500 mg total) by mouth 2 (two) times daily. 14 tablet 1   vitamin B-12 (CYANOCOBALAMIN) 1000 MCG tablet TAKE 1 TABLET (1,000 MCG TOTAL) BY MOUTH EVERY DAY 90 tablet 3   doxycycline (VIBRA-TABS) 100 MG tablet Take 1 tablet (100 mg total) by mouth  2 (two) times daily. 20 tablet 0   gabapentin (NEURONTIN) 100 MG capsule TAKE 1 CAPSULE BY MOUTH THREE TIMES A DAY NEED APPT 90 capsule 5   losartan (COZAAR) 50 MG tablet Take 1 tablet by mouth daily.     carbidopa-levodopa (SINEMET IR) 10-100 MG tablet Take 1 tablet by mouth in the morning. 90 tablet 11   No facility-administered medications prior to visit.    ROS: Review of Systems  Objective:  BP 120/72 (BP Location: Right Arm, Patient Position: Sitting, Cuff Size: Normal)   Pulse (!) 55   Temp 99 F (37.2 C) (Oral)   Ht '5\' 4"'$  (1.626 m)   Wt 116 lb (52.6 kg)   SpO2 100%   BMI 19.91 kg/m   BP Readings from Last 3 Encounters:  01/23/23 120/72  11/13/22 130/85  08/13/22 (!) 164/78    Wt Readings from Last 3 Encounters:  01/23/23 116 lb (52.6 kg)  11/13/22 119 lb 3.2 oz (54.1 kg)  08/13/22 119 lb 9.6 oz (54.3 kg)    Physical Exam  Lab Results  Component Value Date   WBC 3.6 (L) 08/24/2022   HGB 10.9 (L) 08/24/2022   HCT 34.0 (L) 08/24/2022   PLT 102.0 (L) 08/24/2022   GLUCOSE 89 11/13/2022   CHOL 155 07/28/2021   TRIG 78.0 07/28/2021   HDL 56.60 07/28/2021   LDLCALC 83 07/28/2021   ALT 11 11/13/2022   AST 21 11/13/2022   NA 139 11/13/2022   K 4.2 11/13/2022   CL 104 11/13/2022   CREATININE 1.33 (H) 11/13/2022   BUN 42 (H) 11/13/2022   CO2 29 11/13/2022   TSH 0.64 08/24/2022   HGBA1C 6.4 11/13/2022    MM 3D SCREEN BREAST BILATERAL  Result Date: 11/30/2022 CLINICAL DATA:  Screening. EXAM: DIGITAL SCREENING BILATERAL MAMMOGRAM WITH TOMOSYNTHESIS AND CAD TECHNIQUE: Bilateral screening digital craniocaudal and mediolateral oblique mammograms were obtained. Bilateral screening digital breast tomosynthesis was performed. The images were evaluated with computer-aided detection. COMPARISON:  Previous exam(s). ACR Breast Density Category b: There are scattered areas of fibroglandular density. FINDINGS: There are no findings suspicious for malignancy. IMPRESSION: No  mammographic evidence of malignancy. A result letter of this screening mammogram will be mailed directly to the patient. RECOMMENDATION: Screening mammogram in one year. (Code:SM-B-01Y) BI-RADS CATEGORY  1: Negative. Electronically Signed   By: Abelardo Diesel M.D.   On: 11/30/2022 11:34    Assessment & Plan:  Problem List Items Addressed This Visit       Cardiovascular and Mediastinum   Essential hypertension    Continue w/Coreg, Amlodipine, Losartan, Spironolactone        Endocrine   Diabetic neuropathy ([facility]) - Primary    Worse Start Gabapentin 300 mg tid prn (am, supper and 3 am if needed) Blue-Emu cream was recommended to use 2-3 times a day        Type 2 diabetes mellitus with renal manifestations, controlled ([facility])    Chronic On Metformin         Meds ordered this encounter  Medications   gabapentin (NEURONTIN) 300 MG capsule    Sig: Gabapentin 300 mg - take one with breakfast, one with supper and one at 3 am if woke up with pain    Dispense:  90 capsule    Refill:  5      Follow-up: Return in about 6 weeks (around 03/06/2023) for a follow-up visit.  Walker Kehr, MD

## 2023-01-23 NOTE — Patient Instructions (Addendum)
  Gabapentin 300 mg - take one with breakfast, one with supper and one at 3 am if woke up with pain  Blue-Emu cream was recommended to use 2-3 times a day

## 2023-02-11 DIAGNOSIS — H401133 Primary open-angle glaucoma, bilateral, severe stage: Secondary | ICD-10-CM | POA: Diagnosis not present

## 2023-02-13 ENCOUNTER — Ambulatory Visit: Payer: Medicare PPO | Admitting: Internal Medicine

## 2023-02-13 ENCOUNTER — Encounter: Payer: Self-pay | Admitting: Internal Medicine

## 2023-02-13 VITALS — BP 140/80 | HR 55 | Temp 98.1°F | Ht 64.0 in | Wt 116.0 lb

## 2023-02-13 DIAGNOSIS — E1121 Type 2 diabetes mellitus with diabetic nephropathy: Secondary | ICD-10-CM

## 2023-02-13 DIAGNOSIS — E1142 Type 2 diabetes mellitus with diabetic polyneuropathy: Secondary | ICD-10-CM

## 2023-02-13 DIAGNOSIS — R269 Unspecified abnormalities of gait and mobility: Secondary | ICD-10-CM

## 2023-02-13 DIAGNOSIS — I10 Essential (primary) hypertension: Secondary | ICD-10-CM | POA: Diagnosis not present

## 2023-02-13 LAB — HEMOGLOBIN A1C: Hgb A1c MFr Bld: 6.2 % (ref 4.6–6.5)

## 2023-02-13 NOTE — Assessment & Plan Note (Signed)
Stable. 

## 2023-02-13 NOTE — Assessment & Plan Note (Signed)
Better - Gabapentin 300 mg tid prn (am, supper and 3 am if needed) Blue-Emu cream  use 2-3 times a day

## 2023-02-13 NOTE — Assessment & Plan Note (Signed)
Chronic On Metformin

## 2023-02-13 NOTE — Progress Notes (Signed)
Subjective:  Patient ID: Elisabeth Pigeon, female    DOB: [birth year]  Age: 89 y.o. MRN: [id number]  CC: No chief complaint on file.   HPI [employer] presents for painful neuropathy, DM, HTN  Outpatient Medications Prior to Visit  Medication Sig Dispense Refill   acetaminophen (TYLENOL) 325 MG tablet Take 2 tablets (650 mg total) by mouth every 6 (six) hours as needed for moderate pain or fever.     Alcohol Swabs (TRUE COMFORT ALCOHOL PREP PADS) 70 % PADS Use as directed 300 each 1   allopurinol (ZYLOPRIM) 100 MG tablet Take 1 tablet (100 mg total) by mouth daily. 90 tablet 3   amLODipine (NORVASC) 5 MG tablet Take 1 tablet by mouth daily.     ascorbic acid (VITAMIN C) 500 MG tablet TAKE 1 TABLET BY MOUTH EVERY DAY 30 tablet 11   ASPIRIN LOW DOSE 81 MG tablet TAKE 1 TABLET BY MOUTH DAILY. SWALLOW WHOLE. 90 tablet 3   atropine 1 % ophthalmic solution atropine 1 % eye drops     benzonatate (TESSALON) 200 MG capsule      Blood Glucose Calibration (TRUE METRIX LEVEL 1) Low SOLN USE AS DIRECTED 3 each 3   brimonidine (ALPHAGAN) 0.2 % ophthalmic solution Place 1 drop into the right eye 2 times daily.     calcium carbonate (OS-CAL) 600 MG tablet Take 1 tablet (600 mg total) by mouth daily. 90 tablet 3   calcium carbonate (OSCAL) 1500 (600 Ca) MG TABS tablet TAKE 1 TABLET BY MOUTH EVERY DAY 30 tablet 11   carvedilol (COREG) 25 MG tablet Take 1 tablet (25 mg total) by mouth 2 (two) times daily with a meal. 180 tablet 3   cephALEXin (KEFLEX) 500 MG capsule Take 1 capsule by mouth 2 (two) times daily.     Cholecalciferol (VITAMIN D3) 25 MCG (1000 UT) CAPS Take 1 capsule (1,000 Units total) by mouth daily. 90 capsule 3   diclofenac sodium (VOLTAREN) 1 % GEL Apply 2 g topically 2 (two) times daily. 200 g 3   docusate sodium (COLACE) 100 MG capsule TAKE 1 CAPSULE BY MOUTH TWICE A DAY 180 capsule 1   dorzolamide-timolol (COSOPT) 22.3-6.8 MG/ML ophthalmic solution Place 1 drop into both eyes 2 (two)  times daily.      feeding supplement, ENSURE ENLIVE, (ENSURE ENLIVE) LIQD Take 237 mLs by mouth daily at 2 PM. 237 mL 12   feeding supplement, GLUCERNA SHAKE, (GLUCERNA SHAKE) LIQD Take 237 mLs by mouth 2 (two) times daily between meals.  0   gabapentin (NEURONTIN) 300 MG capsule Gabapentin 300 mg - take one with breakfast, one with supper and one at 3 am if woke up with pain 90 capsule 5   glucose blood (GNP TRUE METRIX GLUCOSE STRIPS) test strip Use to check blood sugars twice a day 200 each 2   latanoprost (XALATAN) 0.005 % ophthalmic solution Place 1 drop into both eyes at bedtime.      lidocaine-prilocaine (EMLA) cream Apply 1 application topically as needed. 30 g 0   losartan (COZAAR) 25 MG tablet Take 2 tablets by mouth daily.     losartan (COZAAR) 50 MG tablet TAKE 1 TABLET BY MOUTH EVERY DAY 30 tablet 11   metFORMIN (GLUCOPHAGE) 500 MG tablet Take 1 tablet (500 mg total) by mouth daily with breakfast. Must keep scheduled appt for future refills 90 tablet 1   NON FORMULARY Place 10 drops under the tongue daily as needed (Pain). CBD oil  pantoprazole (PROTONIX) 40 MG tablet TAKE 1 TABLET BY MOUTH EVERY DAY 90 tablet 3   polyethylene glycol (MIRALAX / GLYCOLAX) 17 g packet Take 17 g by mouth daily as needed for mild constipation. 14 each 0   senna (SENOKOT) 8.6 MG TABS tablet Take 1 tablet (8.6 mg total) by mouth 2 (two) times daily. 120 tablet 0   sertraline (ZOLOFT) 100 MG tablet TAKE 1 TABLET BY MOUTH EVERY DAY 90 tablet 1   spironolactone (ALDACTONE) 25 MG tablet TAKE 1 TAB BY MOUTH MON - WED - FRI 90 tablet 1   TRUEplus Lancets 33G MISC USE AS DIRECTED TO CHECK BLOOD SUGAR TWICE A DAY 200 each 1   valACYclovir (VALTREX) 500 MG tablet Take 1 tablet (500 mg total) by mouth 2 (two) times daily. 14 tablet 1   vitamin B-12 (CYANOCOBALAMIN) 1000 MCG tablet TAKE 1 TABLET (1,000 MCG TOTAL) BY MOUTH EVERY DAY 90 tablet 3   allopurinol (ZYLOPRIM) 300 MG tablet Take 1 tablet by mouth daily.      No facility-administered medications prior to visit.    ROS: Review of Systems  Constitutional:  Positive for fatigue. Negative for activity change, appetite change, chills and unexpected weight change.  HENT:  Negative for congestion, mouth sores and sinus pressure.   Eyes:  Positive for visual disturbance.  Respiratory:  Negative for cough and chest tightness.   Gastrointestinal:  Negative for abdominal pain and nausea.  Genitourinary:  Negative for difficulty urinating, frequency and vaginal pain.  Musculoskeletal:  Positive for gait problem. Negative for back pain.  Skin:  Negative for pallor and rash.  Neurological:  Negative for dizziness, tremors, weakness, numbness and headaches.  Psychiatric/Behavioral:  Negative for confusion and sleep disturbance.     Objective:  BP (!) 140/80   Pulse (!) 55   Temp 98.1 F (36.7 C) (Oral)   Ht 5' 4"$  (1.626 m)   Wt 116 lb (52.6 kg)   SpO2 98%   BMI 19.91 kg/m   BP Readings from Last 3 Encounters:  02/13/23 (!) 140/80  01/23/23 120/72  11/13/22 130/85    Wt Readings from Last 3 Encounters:  02/13/23 116 lb (52.6 kg)  01/23/23 116 lb (52.6 kg)  11/13/22 119 lb 3.2 oz (54.1 kg)    Physical Exam Constitutional:      General: She is not in acute distress.    Appearance: She is well-developed. She is obese.  HENT:     Head: Normocephalic.     Right Ear: External ear normal.     Left Ear: External ear normal.     Nose: Nose normal.  Eyes:     General:        Right eye: No discharge.        Left eye: No discharge.     Conjunctiva/sclera: Conjunctivae normal.     Pupils: Pupils are equal, round, and reactive to light.  Neck:     Thyroid: No thyromegaly.     Vascular: No JVD.     Trachea: No tracheal deviation.  Cardiovascular:     Rate and Rhythm: Normal rate and regular rhythm.     Heart sounds: Normal heart sounds.  Pulmonary:     Effort: No respiratory distress.     Breath sounds: No stridor. No wheezing.   Abdominal:     General: Bowel sounds are normal. There is no distension.     Palpations: Abdomen is soft. There is no mass.     Tenderness: There  is no abdominal tenderness. There is no guarding or rebound.  Musculoskeletal:        General: No tenderness.     Cervical back: Normal range of motion and neck supple. No rigidity.  Lymphadenopathy:     Cervical: No cervical adenopathy.  Skin:    Findings: No erythema or rash.  Neurological:     Mental Status: She is oriented to person, place, and time.     Cranial Nerves: No cranial nerve deficit.     Sensory: Sensory deficit present.     Motor: No abnormal muscle tone.     Coordination: Coordination normal.     Gait: Gait abnormal.     Deep Tendon Reflexes: Reflexes normal.  Psychiatric:        Behavior: Behavior normal.        Thought Content: Thought content normal.        Judgment: Judgment normal.     Lab Results  Component Value Date   WBC 3.6 (L) 08/24/2022   HGB 10.9 (L) 08/24/2022   HCT 34.0 (L) 08/24/2022   PLT 102.0 (L) 08/24/2022   GLUCOSE 89 11/13/2022   CHOL 155 07/28/2021   TRIG 78.0 07/28/2021   HDL 56.60 07/28/2021   LDLCALC 83 07/28/2021   ALT 11 11/13/2022   AST 21 11/13/2022   NA 139 11/13/2022   K 4.2 11/13/2022   CL 104 11/13/2022   CREATININE 1.33 (H) 11/13/2022   BUN 42 (H) 11/13/2022   CO2 29 11/13/2022   TSH 0.64 08/24/2022   HGBA1C 6.4 11/13/2022    MM 3D SCREEN BREAST BILATERAL  Result Date: 11/30/2022 CLINICAL DATA:  Screening. EXAM: DIGITAL SCREENING BILATERAL MAMMOGRAM WITH TOMOSYNTHESIS AND CAD TECHNIQUE: Bilateral screening digital craniocaudal and mediolateral oblique mammograms were obtained. Bilateral screening digital breast tomosynthesis was performed. The images were evaluated with computer-aided detection. COMPARISON:  Previous exam(s). ACR Breast Density Category b: There are scattered areas of fibroglandular density. FINDINGS: There are no findings suspicious for malignancy.  IMPRESSION: No mammographic evidence of malignancy. A result letter of this screening mammogram will be mailed directly to the patient. RECOMMENDATION: Screening mammogram in one year. (Code:SM-B-01Y) BI-RADS CATEGORY  1: Negative. Electronically Signed   By: Abelardo Diesel M.D.   On: 11/30/2022 11:34    Assessment & Plan:   Problem List Items Addressed This Visit       Cardiovascular and Mediastinum   Essential hypertension    Continue w/Coreg, Amlodipine, Losartan, Spironolactone Re-checked BP was ok      Relevant Orders   Comprehensive metabolic panel     Endocrine   Type 2 diabetes mellitus with renal manifestations, controlled ([place]) - Primary    Chronic On Metformin      Relevant Orders   Hemoglobin A1c   Comprehensive metabolic panel   Diabetic neuropathy (HCC)    Better - Gabapentin 300 mg tid prn (am, supper and 3 am if needed) Blue-Emu cream  use 2-3 times a day        Other   Gait disorder    Stable       Relevant Orders   Hemoglobin A1c   Comprehensive metabolic panel      No orders of the defined types were placed in this encounter.     Follow-up: No follow-ups on file.  Walker Kehr, MD

## 2023-02-13 NOTE — Assessment & Plan Note (Signed)
Continue w/Coreg, Amlodipine, Losartan, Spironolactone Re-checked BP was ok

## 2023-02-14 LAB — COMPREHENSIVE METABOLIC PANEL
ALT: 8 U/L (ref 0–35)
AST: 16 U/L (ref 0–37)
Albumin: 3.9 g/dL (ref 3.5–5.2)
Alkaline Phosphatase: 91 U/L (ref 39–117)
BUN: 24 mg/dL — ABNORMAL HIGH (ref 6–23)
CO2: 30 mEq/L (ref 19–32)
Calcium: 9.9 mg/dL (ref 8.4–10.5)
Chloride: 105 mEq/L (ref 96–112)
Creatinine, Ser: 1.36 mg/dL — ABNORMAL HIGH (ref 0.40–1.20)
GFR: 34.55 mL/min — ABNORMAL LOW (ref >60.00)
Glucose, Bld: 82 mg/dL (ref 70–99)
Potassium: 4.3 mEq/L (ref 3.5–5.1)
Sodium: 144 mEq/L (ref 135–145)
Total Bilirubin: 0.3 mg/dL (ref 0.2–1.2)
Total Protein: 7.5 g/dL (ref 6.0–8.3)

## 2023-02-20 ENCOUNTER — Telehealth: Payer: Self-pay | Admitting: Internal Medicine

## 2023-02-20 NOTE — Telephone Encounter (Signed)
Patient is in pain and suffering - she needs to be seen - please advise.

## 2023-02-20 NOTE — Telephone Encounter (Signed)
Pt will have to make appt w/ another provider. After tomorrow he will be out of the office until 03/01/23 which is full. His next appt not until 03/05/23..MD ok her to see someone else.Marland KitchenJohny Chess

## 2023-02-25 ENCOUNTER — Encounter: Payer: Self-pay | Admitting: Family Medicine

## 2023-02-25 ENCOUNTER — Ambulatory Visit: Payer: Medicare PPO | Admitting: Family Medicine

## 2023-02-25 ENCOUNTER — Encounter: Payer: Self-pay | Admitting: *Deleted

## 2023-02-25 VITALS — BP 158/80 | HR 70 | Temp 98.1°F | Resp 20 | Ht 64.0 in | Wt 116.0 lb

## 2023-02-25 DIAGNOSIS — G8929 Other chronic pain: Secondary | ICD-10-CM

## 2023-02-25 DIAGNOSIS — M25512 Pain in left shoulder: Secondary | ICD-10-CM | POA: Diagnosis not present

## 2023-02-25 DIAGNOSIS — M25612 Stiffness of left shoulder, not elsewhere classified: Secondary | ICD-10-CM | POA: Diagnosis not present

## 2023-02-25 NOTE — Patient Instructions (Signed)
Tylenol for pain as needed

## 2023-02-25 NOTE — Progress Notes (Signed)
Assessment & Plan:  1-2. Chronic left shoulder pain/Decreased range of motion of left shoulder Uncontrolled. Continue Tylenol and salve for pain relief. Ordered home health physical therapy. Discussed if this does not improve with six weeks of physical therapy, she may need a MRI.  - Ambulatory referral to Home Health   Follow up plan: Return if symptoms worsen or fail to improve.  Hendricks Limes, MSN, APRN, FNP-C  Subjective:  HPI: Breanna [name] is a 89 y.o. female presenting on 02/25/2023 for left arm pain (Small knot - swelling - x 3 mos. )  Shoulder Pain: Patient is accompanied by her son, who she is okay with being present. Patient complaints of left shoulder pain and swelling. The pain is described as aching, shooting, stabbing, and throbbing.  She rates the pain 11/10 at times. The onset of the pain was gradual, starting about 3 months ago and worsening since that time.  The pain occurs  daily .  Location is posterior. Symptoms are aggravated by reaching, lifting, pulling, pushing. Symptoms are diminished by  medication: Tylenol and a salve used and somewhat beneficial.   Limited activities include: reaching, lifting, pulling, pushing.  Denies falling.    ROS: Negative unless specifically indicated above in HPI.   Relevant past medical history reviewed and updated as indicated.   Allergies and medications reviewed and updated.   Current Outpatient Medications:    acetaminophen (TYLENOL) 325 MG tablet, Take 2 tablets (650 mg total) by mouth every 6 (six) hours as needed for moderate pain or fever., Disp: , Rfl:    Alcohol Swabs (TRUE COMFORT ALCOHOL PREP PADS) 70 % PADS, Use as directed, Disp: 300 each, Rfl: 1   allopurinol (ZYLOPRIM) 100 MG tablet, Take 1 tablet (100 mg total) by mouth daily., Disp: 90 tablet, Rfl: 3   amLODipine (NORVASC) 5 MG tablet, Take 1 tablet by mouth daily., Disp: , Rfl:    ascorbic acid (VITAMIN C) 500 MG tablet, TAKE 1 TABLET BY MOUTH EVERY DAY,  Disp: 30 tablet, Rfl: 11   ASPIRIN LOW DOSE 81 MG tablet, TAKE 1 TABLET BY MOUTH DAILY. SWALLOW WHOLE., Disp: 90 tablet, Rfl: 3   atropine 1 % ophthalmic solution, atropine 1 % eye drops, Disp: , Rfl:    benzonatate (TESSALON) 200 MG capsule, , Disp: , Rfl:    Blood Glucose Calibration (TRUE METRIX LEVEL 1) Low SOLN, USE AS DIRECTED, Disp: 3 each, Rfl: 3   brimonidine (ALPHAGAN) 0.2 % ophthalmic solution, Place 1 drop into the right eye 2 times daily., Disp: , Rfl:    calcium carbonate (OS-CAL) 600 MG tablet, Take 1 tablet (600 mg total) by mouth daily., Disp: 90 tablet, Rfl: 3   calcium carbonate (OSCAL) 1500 (600 Ca) MG TABS tablet, TAKE 1 TABLET BY MOUTH EVERY DAY, Disp: 30 tablet, Rfl: 11   carvedilol (COREG) 25 MG tablet, Take 1 tablet (25 mg total) by mouth 2 (two) times daily with a meal., Disp: 180 tablet, Rfl: 3   cephALEXin (KEFLEX) 500 MG capsule, Take 1 capsule by mouth 2 (two) times daily., Disp: , Rfl:    Cholecalciferol (VITAMIN D3) 25 MCG (1000 UT) CAPS, Take 1 capsule (1,000 Units total) by mouth daily., Disp: 90 capsule, Rfl: 3   diclofenac sodium (VOLTAREN) 1 % GEL, Apply 2 g topically 2 (two) times daily., Disp: 200 g, Rfl: 3   docusate sodium (COLACE) 100 MG capsule, TAKE 1 CAPSULE BY MOUTH TWICE A DAY, Disp: 180 capsule, Rfl: 1  dorzolamide-timolol (COSOPT) 22.3-6.8 MG/ML ophthalmic solution, Place 1 drop into both eyes 2 (two) times daily. , Disp: , Rfl:    feeding supplement, ENSURE ENLIVE, (ENSURE ENLIVE) LIQD, Take 237 mLs by mouth daily at 2 PM., Disp: 237 mL, Rfl: 12   feeding supplement, GLUCERNA SHAKE, (GLUCERNA SHAKE) LIQD, Take 237 mLs by mouth 2 (two) times daily between meals., Disp: , Rfl: 0   gabapentin (NEURONTIN) 300 MG capsule, Gabapentin 300 mg - take one with breakfast, one with supper and one at 3 am if woke up with pain, Disp: 90 capsule, Rfl: 5   glucose blood (GNP TRUE METRIX GLUCOSE STRIPS) test strip, Use to check blood sugars twice a day, Disp: 200  each, Rfl: 2   latanoprost (XALATAN) 0.005 % ophthalmic solution, Place 1 drop into both eyes at bedtime. , Disp: , Rfl:    lidocaine-prilocaine (EMLA) cream, Apply 1 application topically as needed., Disp: 30 g, Rfl: 0   losartan (COZAAR) 25 MG tablet, Take 2 tablets by mouth daily., Disp: , Rfl:    losartan (COZAAR) 50 MG tablet, TAKE 1 TABLET BY MOUTH EVERY DAY, Disp: 30 tablet, Rfl: 11   metFORMIN (GLUCOPHAGE) 500 MG tablet, Take 1 tablet (500 mg total) by mouth daily with breakfast. Must keep scheduled appt for future refills, Disp: 90 tablet, Rfl: 1   NON FORMULARY, Place 10 drops under the tongue daily as needed (Pain). CBD oil, Disp: , Rfl:    pantoprazole (PROTONIX) 40 MG tablet, TAKE 1 TABLET BY MOUTH EVERY DAY, Disp: 90 tablet, Rfl: 3   polyethylene glycol (MIRALAX / GLYCOLAX) 17 g packet, Take 17 g by mouth daily as needed for mild constipation., Disp: 14 each, Rfl: 0   senna (SENOKOT) 8.6 MG TABS tablet, Take 1 tablet (8.6 mg total) by mouth 2 (two) times daily., Disp: 120 tablet, Rfl: 0   sertraline (ZOLOFT) 100 MG tablet, TAKE 1 TABLET BY MOUTH EVERY DAY, Disp: 90 tablet, Rfl: 1   spironolactone (ALDACTONE) 25 MG tablet, TAKE 1 TAB BY MOUTH MON - WED - FRI, Disp: 90 tablet, Rfl: 1   TRUEplus Lancets 33G MISC, USE AS DIRECTED TO CHECK BLOOD SUGAR TWICE A DAY, Disp: 200 each, Rfl: 1   valACYclovir (VALTREX) 500 MG tablet, Take 1 tablet (500 mg total) by mouth 2 (two) times daily., Disp: 14 tablet, Rfl: 1   vitamin B-12 (CYANOCOBALAMIN) 1000 MCG tablet, TAKE 1 TABLET (1,000 MCG TOTAL) BY MOUTH EVERY DAY, Disp: 90 tablet, Rfl: 3  Allergies  Allergen Reactions   Verapamil Shortness Of Breath    REACTION: SOB   Aspirin Itching    But tolerates low dose   Atenolol     REACTION: fatigue   Codeine Itching   Codeine Sulfate    Hydrochlorothiazide     REACTION: gout   Hydrocodone       side effects - hallucinations   Ibuprofen     Upset stomach w/high doses   Lisinopril      REACTION: cough   Onion Other (See Comments)    Dry mouth/ gets sores   Shellfish Allergy Swelling    Patient stated she does not eat shellfish, she swells up on different parts of the body   Valsartan Itching   Adhesive  [Tape] Rash   Other Rash    Objective:   BP (!) 158/80 Comment: manual  Pulse 70   Temp 98.1 F (36.7 C)   Resp 20   Ht '5\' 4"'$  (1.626 m)  Wt 116 lb (52.6 kg)   BMI 19.91 kg/m    Physical Exam Vitals reviewed.  Constitutional:      General: She is not in acute distress.    Appearance: Normal appearance. She is not ill-appearing, toxic-appearing or diaphoretic.  HENT:     Head: Normocephalic and atraumatic.  Eyes:     General: No scleral icterus.       Right eye: No discharge.        Left eye: No discharge.     Conjunctiva/sclera: Conjunctivae normal.  Cardiovascular:     Rate and Rhythm: Normal rate.  Pulmonary:     Effort: Pulmonary effort is normal. No respiratory distress.  Musculoskeletal:     Right shoulder: Normal.     Left shoulder: Tenderness present. No effusion, laceration or bony tenderness. Decreased range of motion.     Cervical back: Normal range of motion.  Skin:    General: Skin is warm and dry.     Capillary Refill: Capillary refill takes less than 2 seconds.  Neurological:     General: No focal deficit present.     Mental Status: She is alert and oriented to person, place, and time. Mental status is at baseline.  Psychiatric:        Mood and Affect: Mood normal.        Behavior: Behavior normal.        Thought Content: Thought content normal.        Judgment: Judgment normal.

## 2023-02-28 ENCOUNTER — Emergency Department (HOSPITAL_BASED_OUTPATIENT_CLINIC_OR_DEPARTMENT_OTHER): Payer: Medicare PPO | Admitting: Radiology

## 2023-02-28 ENCOUNTER — Encounter (HOSPITAL_BASED_OUTPATIENT_CLINIC_OR_DEPARTMENT_OTHER): Payer: Self-pay

## 2023-02-28 ENCOUNTER — Observation Stay (HOSPITAL_BASED_OUTPATIENT_CLINIC_OR_DEPARTMENT_OTHER)
Admission: EM | Admit: 2023-02-28 | Discharge: 2023-03-03 | Disposition: A | Discharge status: Home / Self Care | Payer: Medicare PPO | Attending: Internal Medicine | Admitting: Internal Medicine

## 2023-02-28 ENCOUNTER — Other Ambulatory Visit: Payer: Self-pay

## 2023-02-28 DIAGNOSIS — Z862 Personal history of diseases of the blood and blood-forming organs and certain disorders involving the immune mechanism: Secondary | ICD-10-CM

## 2023-02-28 DIAGNOSIS — E1122 Type 2 diabetes mellitus with diabetic chronic kidney disease: Secondary | ICD-10-CM | POA: Diagnosis not present

## 2023-02-28 DIAGNOSIS — Z79899 Other long term (current) drug therapy: Secondary | ICD-10-CM | POA: Diagnosis not present

## 2023-02-28 DIAGNOSIS — N1832 Chronic kidney disease, stage 3b: Secondary | ICD-10-CM | POA: Insufficient documentation

## 2023-02-28 DIAGNOSIS — M75102 Unspecified rotator cuff tear or rupture of left shoulder, not specified as traumatic: Secondary | ICD-10-CM | POA: Diagnosis not present

## 2023-02-28 DIAGNOSIS — Z8673 Personal history of transient ischemic attack (TIA), and cerebral infarction without residual deficits: Secondary | ICD-10-CM | POA: Diagnosis not present

## 2023-02-28 DIAGNOSIS — N189 Chronic kidney disease, unspecified: Secondary | ICD-10-CM

## 2023-02-28 DIAGNOSIS — I10 Essential (primary) hypertension: Secondary | ICD-10-CM | POA: Diagnosis not present

## 2023-02-28 DIAGNOSIS — R778 Other specified abnormalities of plasma proteins: Secondary | ICD-10-CM | POA: Diagnosis not present

## 2023-02-28 DIAGNOSIS — E1121 Type 2 diabetes mellitus with diabetic nephropathy: Secondary | ICD-10-CM

## 2023-02-28 DIAGNOSIS — Z96652 Presence of left artificial knee joint: Secondary | ICD-10-CM | POA: Insufficient documentation

## 2023-02-28 DIAGNOSIS — I129 Hypertensive chronic kidney disease with stage 1 through stage 4 chronic kidney disease, or unspecified chronic kidney disease: Secondary | ICD-10-CM | POA: Diagnosis not present

## 2023-02-28 DIAGNOSIS — J9811 Atelectasis: Secondary | ICD-10-CM | POA: Diagnosis not present

## 2023-02-28 DIAGNOSIS — R7989 Other specified abnormal findings of blood chemistry: Secondary | ICD-10-CM | POA: Diagnosis not present

## 2023-02-28 DIAGNOSIS — F32A Depression, unspecified: Secondary | ICD-10-CM | POA: Diagnosis not present

## 2023-02-28 DIAGNOSIS — M25512 Pain in left shoulder: Secondary | ICD-10-CM | POA: Diagnosis present

## 2023-02-28 DIAGNOSIS — R0789 Other chest pain: Secondary | ICD-10-CM | POA: Diagnosis not present

## 2023-02-28 DIAGNOSIS — R079 Chest pain, unspecified: Secondary | ICD-10-CM | POA: Diagnosis not present

## 2023-02-28 DIAGNOSIS — E119 Type 2 diabetes mellitus without complications: Secondary | ICD-10-CM

## 2023-02-28 DIAGNOSIS — R001 Bradycardia, unspecified: Secondary | ICD-10-CM | POA: Insufficient documentation

## 2023-02-28 DIAGNOSIS — F329 Major depressive disorder, single episode, unspecified: Secondary | ICD-10-CM | POA: Insufficient documentation

## 2023-02-28 DIAGNOSIS — K219 Gastro-esophageal reflux disease without esophagitis: Secondary | ICD-10-CM | POA: Diagnosis not present

## 2023-02-28 LAB — URINALYSIS, ROUTINE W REFLEX MICROSCOPIC
Bacteria, UA: NONE SEEN
Bilirubin Urine: NEGATIVE
Glucose, UA: NEGATIVE mg/dL
Hgb urine dipstick: NEGATIVE
Ketones, ur: NEGATIVE mg/dL
Leukocytes,Ua: NEGATIVE
Nitrite: NEGATIVE
Protein, ur: 30 mg/dL — AB
Specific Gravity, Urine: 1.014 (ref 1.005–1.030)
pH: 7.5 (ref 5.0–8.0)

## 2023-02-28 LAB — TROPONIN I (HIGH SENSITIVITY)
Troponin I (High Sensitivity): 45 ng/L — ABNORMAL HIGH (ref <18)
Troponin I (High Sensitivity): 51 ng/L — ABNORMAL HIGH (ref <18)

## 2023-02-28 LAB — BASIC METABOLIC PANEL
Anion gap: 9 (ref 5–15)
BUN: 30 mg/dL — ABNORMAL HIGH (ref 8–23)
CO2: 25 mmol/L (ref 22–32)
Calcium: 9.5 mg/dL (ref 8.9–10.3)
Chloride: 106 mmol/L (ref 98–111)
Creatinine, Ser: 1.23 mg/dL — ABNORMAL HIGH (ref 0.44–1.00)
GFR, Estimated: 42 mL/min — ABNORMAL LOW (ref >60)
Glucose, Bld: 118 mg/dL — ABNORMAL HIGH (ref 70–99)
Potassium: 4 mmol/L (ref 3.5–5.1)
Sodium: 140 mmol/L (ref 135–145)

## 2023-02-28 LAB — CBC
HCT: 31.6 % — ABNORMAL LOW (ref 36.0–46.0)
Hemoglobin: 10.2 g/dL — ABNORMAL LOW (ref 12.0–15.0)
MCH: 28.4 pg (ref 26.0–34.0)
MCHC: 32.3 g/dL (ref 30.0–36.0)
MCV: 88 fL (ref 80.0–100.0)
Platelets: 127 10*3/uL — ABNORMAL LOW (ref 150–400)
RBC: 3.59 MIL/uL — ABNORMAL LOW (ref 3.87–5.11)
RDW: 14.5 % (ref 11.5–15.5)
WBC: 6 10*3/uL (ref 4.0–10.5)
nRBC: 0 % (ref 0.0–0.2)

## 2023-02-28 MED ORDER — BRIMONIDINE TARTRATE 0.2 % OP SOLN
1.0000 [drp] | Freq: Every day | OPHTHALMIC | Status: DC
  Administered 2023-03-01 – 2023-03-03 (×3): 1 [drp] via OPHTHALMIC
  Filled 2023-02-28: qty 5

## 2023-02-28 MED ORDER — ASPIRIN 81 MG PO TBEC
81.0000 mg | DELAYED_RELEASE_TABLET | Freq: Every day | ORAL | Status: DC
  Administered 2023-03-01 – 2023-03-03 (×3): 81 mg via ORAL
  Filled 2023-02-28 (×3): qty 1

## 2023-02-28 MED ORDER — NALOXONE HCL 0.4 MG/ML IJ SOLN: 0.4000 mg | INTRAMUSCULAR | Status: DC | PRN

## 2023-02-28 MED ORDER — ACETAMINOPHEN 325 MG PO TABS
650.0000 mg | ORAL_TABLET | Freq: Four times a day (QID) | ORAL | Status: DC | PRN
  Administered 2023-03-02: 650 mg via ORAL
  Filled 2023-02-28 (×2): qty 2

## 2023-02-28 MED ORDER — LATANOPROST 0.005 % OP SOLN
1.0000 [drp] | Freq: Every day | OPHTHALMIC | Status: DC
  Administered 2023-03-01 – 2023-03-02 (×3): 1 [drp] via OPHTHALMIC
  Filled 2023-02-28: qty 2.5

## 2023-02-28 MED ORDER — GABAPENTIN 300 MG PO CAPS
300.0000 mg | ORAL_CAPSULE | Freq: Two times a day (BID) | ORAL | Status: DC
  Administered 2023-03-01 – 2023-03-03 (×5): 300 mg via ORAL
  Filled 2023-02-28 (×5): qty 1

## 2023-02-28 MED ORDER — INSULIN ASPART 100 UNIT/ML IJ SOLN: 0.0000 [IU] | Freq: Three times a day (TID) | INTRAMUSCULAR | Status: DC

## 2023-02-28 MED ORDER — DORZOLAMIDE HCL-TIMOLOL MAL 2-0.5 % OP SOLN
1.0000 [drp] | Freq: Two times a day (BID) | OPHTHALMIC | Status: DC
  Administered 2023-03-01 – 2023-03-03 (×5): 1 [drp] via OPHTHALMIC
  Filled 2023-02-28: qty 10

## 2023-02-28 MED ORDER — PANTOPRAZOLE SODIUM 40 MG PO TBEC
40.0000 mg | DELAYED_RELEASE_TABLET | Freq: Every day | ORAL | Status: DC
  Administered 2023-03-01 – 2023-03-03 (×3): 40 mg via ORAL
  Filled 2023-02-28 (×3): qty 1

## 2023-02-28 MED ORDER — FENTANYL CITRATE PF 50 MCG/ML IJ SOSY: 25.0000 ug | PREFILLED_SYRINGE | INTRAMUSCULAR | Status: DC | PRN

## 2023-02-28 MED ORDER — ONDANSETRON HCL 4 MG/2ML IJ SOLN: 4.0000 mg | Freq: Four times a day (QID) | INTRAMUSCULAR | Status: DC | PRN

## 2023-02-28 MED ORDER — LOSARTAN POTASSIUM 50 MG PO TABS
50.0000 mg | ORAL_TABLET | Freq: Every day | ORAL | Status: DC
  Administered 2023-03-01: 50 mg via ORAL
  Filled 2023-02-28: qty 1

## 2023-02-28 MED ORDER — SERTRALINE HCL 100 MG PO TABS
100.0000 mg | ORAL_TABLET | Freq: Every day | ORAL | Status: DC
  Administered 2023-03-01 – 2023-03-03 (×3): 100 mg via ORAL
  Filled 2023-02-28 (×3): qty 1

## 2023-02-28 MED ORDER — MELATONIN 3 MG PO TABS: 3.0000 mg | ORAL_TABLET | Freq: Every evening | ORAL | Status: DC | PRN

## 2023-02-28 MED ORDER — DOCUSATE SODIUM 100 MG PO CAPS
100.0000 mg | ORAL_CAPSULE | Freq: Two times a day (BID) | ORAL | Status: DC
  Administered 2023-03-01 – 2023-03-03 (×3): 100 mg via ORAL
  Filled 2023-02-28 (×4): qty 1

## 2023-02-28 MED ORDER — ASPIRIN 81 MG PO CHEW
324.0000 mg | CHEWABLE_TABLET | Freq: Once | ORAL | Status: AC
  Administered 2023-02-28: 324 mg via ORAL
  Filled 2023-02-28: qty 4

## 2023-02-28 MED ORDER — ACETAMINOPHEN 650 MG RE SUPP: 650.0000 mg | Freq: Four times a day (QID) | RECTAL | Status: DC | PRN

## 2023-02-28 MED ORDER — POLYETHYLENE GLYCOL 3350 17 G PO PACK: 17.0000 g | PACK | Freq: Every day | ORAL | Status: DC | PRN

## 2023-02-28 NOTE — Plan of Care (Signed)

## 2023-02-28 NOTE — ED Triage Notes (Signed)
Pt to ED c/o left shoulder pain that radiates down arm x 2 weeks. No known injury. Reports evaluated for the same 1 week ago, and was told to do physical therapy.

## 2023-02-28 NOTE — ED Notes (Signed)
[facility] at bedside to transport pt to [facility]

## 2023-02-28 NOTE — H&P (Signed)
History and Physical      Breanna [name] [id number] DOB: 1931/12/19 DOA: 02/28/2023  PCP: Cassandria Anger, MD  Patient coming from: home   I have personally briefly reviewed patient's old medical records in [place]  Chief Complaint: left shoulder pain  HPI: Breanna [name] is a 89 y.o. female with medical history significant for essential hypertension, type 2 diabetes mellitus, CKD 3B associated baseline creatinine 1.2-1.5, anemia of chronic kidney disease associated baseline hemoglobin 10-11,b who is admitted to [facility] on 02/28/2023 by way of transfer from [facility] emergency department for further evaluation management elevated troponin after presenting from home to the latter facility complaining of left shoulder pain.  The patient reports 3 to 4 months of recurrent sharp, nonradiating left shoulder discomfort, the worsens with certain movements of the left upper extremity.  Denies any preceding trauma to the left shoulder or left upper extremity.  The pain does not intensify with ambulation.  She notes poor pain control relating to her left shoulder discomfort with outpatient analgesic intervention, including gabapentin.  She was recently seen for this complaint of 3 to 4 months of left shoulder discomfort, at which time she was provided with referral to physical therapy, but is not yet been able to establish with PT.  She notes a history of right-sided rotator cuff tear, prompting repair of her right rotator cuff in 2008.  Has not previously undergone surgical intervention on the left shoulder.  Denies any associated acute focal weakness involving the left upper extremity nor any acute focal numbness or paresthesias.  She notes that her 3 to 4 months of left shoulder pain is not associate with any shortness of breath, palpitations, diaphoresis, nausea, vomiting, dizziness, presyncope, or syncope.  Denies any associated left shoulder erythema, increased warmth, or  gross swelling.  Not associate with any subjective fever, chills, rigors, or generalized myalgias.  Denies any history of IV drug abuse.  Denies any recent cardiac ischemic evaluation, including no recent stress test.  Per chart review, no prior echocardiogram on file.  In the setting of suboptimal pain control relating to the left shoulder, she presents to [facility] emergency department today for further evaluation and management thereof.      [facility] ED Course:  Vital signs in the ED were notable for the following: Afebrile; heart rate [id number], systolic blood pressures in the 140s to 160s; respiratory rate 18-21, oxygen saturation 96 to 100% on room air.  Labs were notable for the following: BMP notable for the following: Sodium 140, potassium 4.0, creatinine 1.23 compared to most recent prior value of 1.36 on 02/13/2023, glucose 118.  High-sensitivity troponin I initially 45, with repeat value trending up slightly to 51, in the absence of any prior high sensitive troponin I data points for reported comparison.  CBC notable for white blood cell count 6000, hemoglobin 10.2 associated normocytic/normochromic findings as well as nonelevated RDW, and relative demonstrates prior hemoglobin Digitaline of 10.9 in September 2023.  Urinalysis showed no evidence of white blood cells, was nitrate/leukocyte esterase negative, will demonstrating the presence of hyaline cast.  Per my interpretation, EKG in ED demonstrated the following: Sinus rhythm with heart rate 56, normal intervals, no evidence of T wave or ST changes, including no evidence of ST elevation.  Imaging and additional notable ED work-up: 2 view chest x-ray, performed radiology read, shows linear subsegmental atelectasis in the left upper and lower lobes, in the absence of any evidence of infiltrate, edema, effusion, or pneumothorax.  Chest x-ray also showed evidence of mild acromioclavicular degenerative changes.  EDP discussed case with  on-call cardiology fellow, who recommended admission to [facility], and conveyed the cardiology will see the patient in the morning (3/8), suspecting that they will pursue stress test at that time.   While in the ED, the following were administered: Full dose aspirin x 1.  Subsequently, the patient was admitted to [facility] for further evaluation and management of presenting elevated troponin in the context of presenting 3 to 4 months of nontraumatic left shoulder discomfort.     Review of Systems: As per HPI otherwise 10 point review of systems negative.   Past Medical History:  Diagnosis Date   Adrenal adenoma 2006   Boils 2009   Cholelithiasis    asympt. w/normal HIDA 03/2010   CVA (cerebral infarction) 2010   Cerebellar   Diverticulitis    Esophageal spasm 2011   GERD (gastroesophageal reflux disease)    Gout    History of colon polyps    HTN (hypertension)    Hydronephrosis    LEFT/ Surgical intervention   Hyperlipidemia    LBP (low back pain)    Osteoarthritis    Pulmonary HTN (HCC)    Stress    Type II or unspecified type diabetes mellitus without mention of complication, not stated as uncontrolled     Past Surgical History:  Procedure Laterality Date   ABDOMINAL HYSTERECTOMY     APPENDECTOMY     2020   BACK SURGERY     BREAST BIOPSY     RIGHT   CATARACT EXTRACTION, BILATERAL     COLECTOMY  2006   Sigmoid   FOOT SURGERY     BILATERAL   HAMMER TOE SURGERY     INTRAMEDULLARY (IM) NAIL INTERTROCHANTERIC Right 07/21/2020   Procedure: INTRAMEDULLARY (IM) NAIL INTERTROCHANTRIC;  Surgeon: Renette Butters, MD;  Location: [facility];  Service: Orthopedics;  Laterality: Right;   ROTATOR CUFF REPAIR  2008   RIGHT   TOTAL KNEE ARTHROPLASTY  2003   LEFT   VARICOSE VEIN SURGERY     vein stripping/lower extremities    Social History:  reports that she has never smoked. She has never used smokeless tobacco. She reports that she does not drink alcohol and does not use  drugs.   Allergies  Allergen Reactions   Verapamil Shortness Of Breath    REACTION: SOB   Aspirin Itching    But tolerates low dose   Atenolol     REACTION: fatigue   Codeine Itching   Codeine Sulfate    Hydrochlorothiazide     REACTION: gout   Hydrocodone       side effects - hallucinations   Ibuprofen     Upset stomach w/high doses   Lisinopril     REACTION: cough   Onion Other (See Comments)    Dry mouth/ gets sores   Shellfish Allergy Swelling    Patient stated she does not eat shellfish, she swells up on different parts of the body   Valsartan Itching   Adhesive  [Tape] Rash   Other Rash    Family History  Problem Relation Age of Onset   Prostate cancer Father    Hypertension Father    Cancer Father        prostate   Heart disease Mother    Diabetes Mother    Diabetes Other        1st degree relative   Heart disease Other  Hypertension Other    Prostate cancer Maternal Uncle    Cancer Maternal Uncle        prostate   Breast cancer Daughter    Cancer Daughter        breast   Colon cancer Neg Hx     Family history reviewed and not pertinent    Prior to Admission medications   Medication Sig Start Date End Date Taking? Authorizing Provider  acetaminophen (TYLENOL) 325 MG tablet Take 2 tablets (650 mg total) by mouth every 6 (six) hours as needed for moderate pain or fever. 07/29/20   Nolberto Hanlon, MD  Alcohol Swabs (TRUE COMFORT ALCOHOL PREP PADS) 70 % PADS Use as directed 04/26/21   Plotnikov, Evie Lacks, MD  allopurinol (ZYLOPRIM) 100 MG tablet Take 1 tablet (100 mg total) by mouth daily. 04/11/22   Plotnikov, Evie Lacks, MD  amLODipine (NORVASC) 5 MG tablet Take 1 tablet by mouth daily.    [provider]  ascorbic acid (VITAMIN C) 500 MG tablet TAKE 1 TABLET BY MOUTH EVERY DAY 02/05/22   Plotnikov, Evie Lacks, MD  ASPIRIN LOW DOSE 81 MG tablet TAKE 1 TABLET BY MOUTH DAILY. SWALLOW WHOLE. 10/01/22   Plotnikov, Evie Lacks, MD  atropine 1 % ophthalmic  solution atropine 1 % eye drops 01/16/21   [provider]  benzonatate (TESSALON) 200 MG capsule     [provider]  Blood Glucose Calibration (TRUE METRIX LEVEL 1) Low SOLN USE AS DIRECTED 04/20/21   Plotnikov, Evie Lacks, MD  brimonidine (ALPHAGAN) 0.2 % ophthalmic solution Place 1 drop into the right eye 2 times daily.    [provider]  calcium carbonate (OS-CAL) 600 MG tablet Take 1 tablet (600 mg total) by mouth daily. 03/07/21   Plotnikov, Evie Lacks, MD  calcium carbonate (OSCAL) 1500 (600 Ca) MG TABS tablet TAKE 1 TABLET BY MOUTH EVERY DAY 03/05/22   Plotnikov, Evie Lacks, MD  carvedilol (COREG) 25 MG tablet Take 1 tablet (25 mg total) by mouth 2 (two) times daily with a meal. 08/24/20   Plotnikov, Evie Lacks, MD  cephALEXin (KEFLEX) 500 MG capsule Take 1 capsule by mouth 2 (two) times daily.    [provider]  Cholecalciferol (VITAMIN D3) 25 MCG (1000 UT) CAPS Take 1 capsule (1,000 Units total) by mouth daily. 04/13/22   Plotnikov, Evie Lacks, MD  diclofenac sodium (VOLTAREN) 1 % GEL Apply 2 g topically 2 (two) times daily. 07/25/17   Plotnikov, Evie Lacks, MD  docusate sodium (COLACE) 100 MG capsule TAKE 1 CAPSULE BY MOUTH TWICE A DAY 07/27/22   Plotnikov, Evie Lacks, MD  dorzolamide-timolol (COSOPT) 22.3-6.8 MG/ML ophthalmic solution Place 1 drop into both eyes 2 (two) times daily.     [provider]  feeding supplement, ENSURE ENLIVE, (ENSURE ENLIVE) LIQD Take 237 mLs by mouth daily at 2 PM. 07/30/20   Nolberto Hanlon, MD  feeding supplement, GLUCERNA SHAKE, (GLUCERNA SHAKE) LIQD Take 237 mLs by mouth 2 (two) times daily between meals. 07/29/20   Nolberto Hanlon, MD  gabapentin (NEURONTIN) 300 MG capsule Gabapentin 300 mg - take one with breakfast, one with supper and one at 3 am if woke up with pain 01/23/23   Plotnikov, Evie Lacks, MD  glucose blood (GNP TRUE METRIX GLUCOSE STRIPS) test strip Use to check blood sugars twice a day 04/26/21   Plotnikov, Evie Lacks, MD   latanoprost (XALATAN) 0.005 % ophthalmic solution Place 1 drop into both eyes at bedtime.  04/26/16  [provider]  lidocaine-prilocaine (EMLA) cream Apply 1 application topically as needed. 06/15/21   Landis Martins, DPM  losartan (COZAAR) 25 MG tablet Take 2 tablets by mouth daily.    [provider]  losartan (COZAAR) 50 MG tablet TAKE 1 TABLET BY MOUTH EVERY DAY 06/04/22   Plotnikov, Evie Lacks, MD  metFORMIN (GLUCOPHAGE) 500 MG tablet Take 1 tablet (500 mg total) by mouth daily with breakfast. Must keep scheduled appt for future refills 08/30/22   Plotnikov, Evie Lacks, MD  NON FORMULARY Place 10 drops under the tongue daily as needed (Pain). CBD oil    [provider]  pantoprazole (PROTONIX) 40 MG tablet TAKE 1 TABLET BY MOUTH EVERY DAY 12/16/22   Plotnikov, Evie Lacks, MD  polyethylene glycol (MIRALAX / GLYCOLAX) 17 g packet Take 17 g by mouth daily as needed for mild constipation. 07/29/20   Nolberto Hanlon, MD  senna (SENOKOT) 8.6 MG TABS tablet Take 1 tablet (8.6 mg total) by mouth 2 (two) times daily. 07/29/20   Nolberto Hanlon, MD  sertraline (ZOLOFT) 100 MG tablet TAKE 1 TABLET BY MOUTH EVERY DAY 11/12/22   Plotnikov, Evie Lacks, MD  spironolactone (ALDACTONE) 25 MG tablet TAKE 1 TAB BY MOUTH MON - WED - FRI 11/12/22   Plotnikov, Evie Lacks, MD  TRUEplus Lancets 33G MISC USE AS DIRECTED TO CHECK BLOOD SUGAR TWICE A DAY 07/23/22   Plotnikov, Evie Lacks, MD  valACYclovir (VALTREX) 500 MG tablet Take 1 tablet (500 mg total) by mouth 2 (two) times daily. 08/22/21   Plotnikov, Evie Lacks, MD  vitamin B-12 (CYANOCOBALAMIN) 1000 MCG tablet TAKE 1 TABLET (1,000 MCG TOTAL) BY MOUTH EVERY DAY 05/04/22   Plotnikov, Evie Lacks, MD     Objective    Physical Exam: Vitals:   02/28/23 1700 02/28/23 1800 02/28/23 2045 02/28/23 2233  BP: (!) 149/84 104/66 (!) 165/85 (!) 164/78  Pulse: (!) 58 (!) 56 (!) 57 (!) 51  Resp: (!) '21 18 19 20  '$ Temp:  98.1 F (36.7 C)  98.3 F (36.8 C)   TempSrc:  Oral    SpO2: 95% 96% 98% 100%  Weight:      Height:        General: appears to be stated age; alert, oriented Skin: warm, dry, no rash Head:  AT/[place] Mouth:  Oral mucosa membranes appear dry, normal dentition Neck: supple; trachea midline Heart:  RRR; did not appreciate any M/R/G Lungs: CTAB, did not appreciate any wheezes, rales, or rhonchi Abdomen: + BS; soft, ND, NT Vascular: 2+ pedal pulses b/l; 2+ radial pulses b/l Extremities: no peripheral edema, no muscle wasting Neuro: strength and sensation intact in upper and lower extremities b/l     Labs on Admission: I have personally reviewed following labs and imaging studies  CBC: Recent Labs  Lab 02/28/23 1540  WBC 6.0  HGB 10.2*  HCT 31.6*  MCV 88.0  PLT [id number]   Basic Metabolic Panel: Recent Labs  Lab 02/28/23 1540  NA 140  K 4.0  CL 106  CO2 25  GLUCOSE 118*  BUN 30*  CREATININE 1.23*  CALCIUM 9.5   GFR: Estimated Creatinine Clearance: 24.5 mL/min (A) (by C-G formula based on SCr of 1.23 mg/dL (H)). Liver Function Tests: No results for input(s): "AST", "ALT", "ALKPHOS", "BILITOT", "PROT", "ALBUMIN" in the last 168 hours. No results for input(s): "LIPASE", "AMYLASE" in the last 168 hours. No results for input(s): "AMMONIA" in the last 168 hours. Coagulation Profile: No results for input(s): "INR", "  PROTIME" in the last 168 hours. Cardiac Enzymes: No results for input(s): "CKTOTAL", "CKMB", "CKMBINDEX", "TROPONINI" in the last 168 hours. BNP (last 3 results) No results for input(s): "PROBNP" in the last 8760 hours. HbA1C: No results for input(s): "HGBA1C" in the last 72 hours. CBG: No results for input(s): "GLUCAP" in the last 168 hours. Lipid Profile: No results for input(s): "CHOL", "HDL", "LDLCALC", "TRIG", "CHOLHDL", "LDLDIRECT" in the last 72 hours. Thyroid Function Tests: No results for input(s): "TSH", "T4TOTAL", "FREET4", "T3FREE", "THYROIDAB" in the last 72 hours. Anemia Panel: No  results for input(s): "VITAMINB12", "FOLATE", "FERRITIN", "TIBC", "IRON", "RETICCTPCT" in the last 72 hours. Urine analysis:    Component Value Date/Time   COLORURINE YELLOW 02/28/2023 1908   APPEARANCEUR CLEAR 02/28/2023 1908   LABSPEC 1.014 02/28/2023 1908   PHURINE 7.5 02/28/2023 1908   GLUCOSEU NEGATIVE 02/28/2023 1908   GLUCOSEU NEGATIVE 11/29/2021 1134   HGBUR NEGATIVE 02/28/2023 1908   BILIRUBINUR NEGATIVE 02/28/2023 1908   KETONESUR NEGATIVE 02/28/2023 1908   PROTEINUR 30 (A) 02/28/2023 1908   UROBILINOGEN 0.2 11/29/2021 1134   NITRITE NEGATIVE 02/28/2023 1908   LEUKOCYTESUR NEGATIVE 02/28/2023 1908    Radiological Exams on Admission: DG Chest 2 View  Result Date: 02/28/2023 CLINICAL DATA:  Left shoulder pain. EXAM: CHEST - 2 VIEW COMPARISON:  Radiograph 08/24/2022 FINDINGS: Stable heart size allowing for AP technique. Unchanged mediastinal contours. Linear subsegmental opacity in the left upper and lower lobes typical of atelectasis. No pneumothorax or pleural effusion. Normal pulmonary vasculature. Thoracic spondylosis with anterior spurring. There is mild acromioclavicular degenerative change of both shoulders. IMPRESSION: Linear subsegmental atelectasis in the left upper and lower lobes. Electronically Signed   By: Keith Rake M.D.   On: 02/28/2023 15:10      Assessment/Plan    Principal Problem:   Elevated troponin Active Problems:   DM2 (diabetes mellitus, type 2) (HCC)   Essential hypertension   GERD (gastroesophageal reflux disease)   History of anemia due to chronic kidney disease   Left shoulder pain   Stage 3b chronic kidney disease (CKD) ([place])   Depression     #) Elevated troponin: mildly elevated initial troponin of 45, with repeat value trending up slightly to 51. No prior high sensitivity troponin I value available for point of comparison.  Consequently, unclear if this very mild flat elevation is consistent with the patient's baseline,  particularly in the context of likely diminished renal clearance as a consequence of her known underlying CKD 3B.  EKG shows no evidence of acute ischemic changes, including no evidence of STEMI, and CXR showed no acute CP process, including no evidence of pneumothorax.  Additionally, presentation is not associated with any CP, although an underlying history of type 2 diabetes mellitus is noted, potentially, leading evaluation for the presence of chest pain.  Overall, ACS is felt to be less.   EDP discussed patient's case with on-call cardiology fellow, who conveyed that cardiology will formally consult at [facility] long in the morning, with potential for pursuit of stress test at that time.  Will continue to trend troponin, and also pursue echocardiogram in the morning.  Of note, presentation clinically less suggestive of acute PE . Full dose aspirin administered was administered earlier today, as further noted above.  Plan: repeat troponin in the AM. Monitor on telemetry. PRN EKG for development of chest pain. Check serum Mg level and repeat CMP in the morning.  Repeat CBC in the AM.  Cardiology to formally consult in the morning, as  further detailed above.  Given there is suspicion for pursuit of stress test, will hold home beta blocker.  Continue home losartan.              #) Left shoulder pain: Atraumatic left shoulder discomfort over the course of the last 3 to 4 months.  Left upper extremity appears neurovascularly intact.  There is the potential for contribution from osteoarthritis given that today's chest x-ray showed evidence of associated mild acromioclavicular degenerative changes.  Differential also includes potential rotator cuff pathology, particular noting that the patient has a history of right-sided rotator cuff repair requiring surgical repair in 2008.  Per chart review, I am not seeing any recent dedicated plain films of the left shoulder.  Would likely benefit from physical  therapy, noting recent referral as an outpatient for such, as further detailed above.  Given her presenting mildly elevated troponin, will refrain from aggressive NSAIDs at this time.   Plan: Dedicated plain films of the left shoulder, as above.  Prn IV fentanyl.  Physical therapy consult placed in the morning.              #) Essential Hypertension: documented h/o such, with outpatient antihypertensive regimen including amlodipine, Coreg, losartan, spironolactone..  SBP's in the ED today: 140s to 160s mmHg. in the setting of the patient's presenting mildly elevated troponin, will hold home amlodipine now, given that there is a relative contraindication due to Calci-Chew blockers in the setting of ACS, although ACS is felt to be less likely, as further detailed above.  Additionally, given potential for cardiology stress test in the morning, will hold home beta-blocker for now.  Plan: Close monitoring of subsequent BP via routine VS. hold home amlodipine, Coreg for now.  Additionally, will hold home spironolactone for now.  Continue home losartan.              #) Type 2 Diabetes Mellitus: documented history of such. Home insulin regimen: None. Home oral hypoglycemic agents: Metformin. presenting blood sugar: 118. Most recent A1c noted to be 6.2% when checked in February 2024.  Appears complicated by diabetic peripheral polyneuropathy, for which the patient is on gabapentin at home.   Plan: accuchecks QAC and HS with low dose SSI. hold home oral hypoglycemic agents during this hospitalization.  Continue home gabapentin.            #) CKD Stage 3B: Documented history of such, with baseline creatinine 1.2-1.5, with presenting creatinine consistent with this baseline.   Plan: Monitor strict I's and O's and daily weights.  Attempt to avoid nephrotoxic agents.  CMP/magnesium level in the AM.              #) GERD: documented h/o such; on Protonix as outpatient.    Plan: continue home PPI.              #) Depression: documented h/o such. On Zoloft as outpatient.  Of note, presenting EKG shows no evidence of QTc prolongation.  Plan: Continue home Zoloft.         #) Anemia of chronic kidney disease: Documented history of such, a/w with baseline hgb range 10-11, with presenting hgb consistent with this range, in the absence of any overt evidence of active bleed.     Plan: Repeat CBC in the morning.        DVT prophylaxis: SCD's   Code Status: Full code Family Communication: none Disposition Plan: Per Rounding Team Consults called: EDP at [facility] discussed patient's case with  the on-call cardiology fellow, who conveyed that the cardiology service will formally consult and see the patient at [facility] long on the morning of 03/01/2023, as further detailed above;  Admission status: Observation    I SPENT GREATER THAN 75  MINUTES IN CLINICAL CARE TIME/MEDICAL DECISION-MAKING IN COMPLETING THIS ADMISSION.     Sutton DO [facility] [place]   02/28/2023, 11:19 PM

## 2023-02-28 NOTE — ED Provider Notes (Signed)
[facility] Provider Note   CSN: [id number] Arrival date & time: 02/28/23  1253     History  Chief Complaint  Patient presents with   Shoulder Pain    left    Breanna [name] is a 89 y.o. female with PMH HTN, HLD, T2DM, CVA without residual deficits, GERD who who presents to ED c/o L shoulder pain that has moved down to L hand. She states the shoulder pain started 2 weeks ago and for the last week it has moved to hand. She is taking '500mg'$  Tylenol once daily at home with no relief. She was seen by her PCP for this pain in the last week who recommended Tylenol and salve and if she continued to have pain possibility of an MRI in the future. They also recommended home PT which pt is awaiting to be arranged. Pt states she has not fallen or injured the shoulder and she has not had chronic pain in the left shoulder in the past. She states she came in today due to pain that started radiating down the arm and having occasional pain under her L breast for the last 2 days. She states the pain under her L breast is intermittent and seems to be worse with movement of her trunk and left arm but sometimes pain is not associated with this. She denies personal history of CAD/MI or other heart problems. Her son has history of prior CABG. Pt denies SOB, diaphoresis, lightheadedness, dizziness, n/v/d, fever, chills, cough, congestion, or focal weakness. She states she has not previously had imaging of her L shoulder.    Home Medications Prior to Admission medications   Medication Sig Start Date End Date Taking? Authorizing Provider  acetaminophen (TYLENOL) 325 MG tablet Take 2 tablets (650 mg total) by mouth every 6 (six) hours as needed for moderate pain or fever. 07/29/20   Nolberto Hanlon, MD  Alcohol Swabs (TRUE COMFORT ALCOHOL PREP PADS) 70 % PADS Use as directed 04/26/21   Plotnikov, Evie Lacks, MD  allopurinol (ZYLOPRIM) 100 MG tablet Take 1 tablet (100 mg total) by  mouth daily. 04/11/22   Plotnikov, Evie Lacks, MD  amLODipine (NORVASC) 5 MG tablet Take 1 tablet by mouth daily.    [provider]  ascorbic acid (VITAMIN C) 500 MG tablet TAKE 1 TABLET BY MOUTH EVERY DAY 02/05/22   Plotnikov, Evie Lacks, MD  ASPIRIN LOW DOSE 81 MG tablet TAKE 1 TABLET BY MOUTH DAILY. SWALLOW WHOLE. 10/01/22   Plotnikov, Evie Lacks, MD  atropine 1 % ophthalmic solution atropine 1 % eye drops 01/16/21   [provider]  benzonatate (TESSALON) 200 MG capsule     [provider]  Blood Glucose Calibration (TRUE METRIX LEVEL 1) Low SOLN USE AS DIRECTED 04/20/21   Plotnikov, Evie Lacks, MD  brimonidine (ALPHAGAN) 0.2 % ophthalmic solution Place 1 drop into the right eye 2 times daily.    [provider]  calcium carbonate (OS-CAL) 600 MG tablet Take 1 tablet (600 mg total) by mouth daily. 03/07/21   Plotnikov, Evie Lacks, MD  calcium carbonate (OSCAL) 1500 (600 Ca) MG TABS tablet TAKE 1 TABLET BY MOUTH EVERY DAY 03/05/22   Plotnikov, Evie Lacks, MD  carvedilol (COREG) 25 MG tablet Take 1 tablet (25 mg total) by mouth 2 (two) times daily with a meal. 08/24/20   Plotnikov, Evie Lacks, MD  cephALEXin (KEFLEX) 500 MG capsule Take 1 capsule by mouth 2 (two) times daily.  [provider]  Cholecalciferol (VITAMIN D3) 25 MCG (1000 UT) CAPS Take 1 capsule (1,000 Units total) by mouth daily. 04/13/22   Plotnikov, Evie Lacks, MD  diclofenac sodium (VOLTAREN) 1 % GEL Apply 2 g topically 2 (two) times daily. 07/25/17   Plotnikov, Evie Lacks, MD  docusate sodium (COLACE) 100 MG capsule TAKE 1 CAPSULE BY MOUTH TWICE A DAY 07/27/22   Plotnikov, Evie Lacks, MD  dorzolamide-timolol (COSOPT) 22.3-6.8 MG/ML ophthalmic solution Place 1 drop into both eyes 2 (two) times daily.     [provider]  feeding supplement, ENSURE ENLIVE, (ENSURE ENLIVE) LIQD Take 237 mLs by mouth daily at 2 PM. 07/30/20   Nolberto Hanlon, MD  feeding supplement, GLUCERNA SHAKE, (GLUCERNA SHAKE) LIQD  Take 237 mLs by mouth 2 (two) times daily between meals. 07/29/20   Nolberto Hanlon, MD  gabapentin (NEURONTIN) 300 MG capsule Gabapentin 300 mg - take one with breakfast, one with supper and one at 3 am if woke up with pain 01/23/23   Plotnikov, Evie Lacks, MD  glucose blood (GNP TRUE METRIX GLUCOSE STRIPS) test strip Use to check blood sugars twice a day 04/26/21   Plotnikov, Evie Lacks, MD  latanoprost (XALATAN) 0.005 % ophthalmic solution Place 1 drop into both eyes at bedtime.  04/26/16   [provider]  lidocaine-prilocaine (EMLA) cream Apply 1 application topically as needed. 06/15/21   Landis Martins, DPM  losartan (COZAAR) 25 MG tablet Take 2 tablets by mouth daily.    [provider]  losartan (COZAAR) 50 MG tablet TAKE 1 TABLET BY MOUTH EVERY DAY 06/04/22   Plotnikov, Evie Lacks, MD  metFORMIN (GLUCOPHAGE) 500 MG tablet Take 1 tablet (500 mg total) by mouth daily with breakfast. Must keep scheduled appt for future refills 08/30/22   Plotnikov, Evie Lacks, MD  NON FORMULARY Place 10 drops under the tongue daily as needed (Pain). CBD oil    [provider]  pantoprazole (PROTONIX) 40 MG tablet TAKE 1 TABLET BY MOUTH EVERY DAY 12/16/22   Plotnikov, Evie Lacks, MD  polyethylene glycol (MIRALAX / GLYCOLAX) 17 g packet Take 17 g by mouth daily as needed for mild constipation. 07/29/20   Nolberto Hanlon, MD  senna (SENOKOT) 8.6 MG TABS tablet Take 1 tablet (8.6 mg total) by mouth 2 (two) times daily. 07/29/20   Nolberto Hanlon, MD  sertraline (ZOLOFT) 100 MG tablet TAKE 1 TABLET BY MOUTH EVERY DAY 11/12/22   Plotnikov, Evie Lacks, MD  spironolactone (ALDACTONE) 25 MG tablet TAKE 1 TAB BY MOUTH MON - WED - FRI 11/12/22   Plotnikov, Evie Lacks, MD  TRUEplus Lancets 33G MISC USE AS DIRECTED TO CHECK BLOOD SUGAR TWICE A DAY 07/23/22   Plotnikov, Evie Lacks, MD  valACYclovir (VALTREX) 500 MG tablet Take 1 tablet (500 mg total) by mouth 2 (two) times daily. 08/22/21   Plotnikov, Evie Lacks, MD  vitamin B-12  (CYANOCOBALAMIN) 1000 MCG tablet TAKE 1 TABLET (1,000 MCG TOTAL) BY MOUTH EVERY DAY 05/04/22   Plotnikov, Evie Lacks, MD      Allergies    Verapamil, Aspirin, Atenolol, Codeine, Codeine sulfate, Hydrochlorothiazide, Hydrocodone, Ibuprofen, Lisinopril, Onion, Shellfish allergy, Valsartan, Adhesive  [tape], and Other    Review of Systems   Review of Systems  All other systems reviewed and are negative.   Physical Exam Updated Vital Signs BP 104/66   Pulse (!) 56   Temp 98.1 F (36.7 C) (Oral)   Resp 18   Ht '5\' 2"'$  (1.575 m)  Wt 52.6 kg   SpO2 96%   BMI 21.22 kg/m  Physical Exam Vitals and nursing note reviewed.  Constitutional:      General: She is not in acute distress.    Appearance: Normal appearance. She is not ill-appearing, toxic-appearing or diaphoretic.  HENT:     Head: Normocephalic and atraumatic.     Mouth/Throat:     Mouth: Mucous membranes are moist.  Cardiovascular:     Rate and Rhythm: Normal rate and regular rhythm.     Heart sounds: No murmur heard. Pulmonary:     Effort: Pulmonary effort is normal. No respiratory distress.     Breath sounds: Normal breath sounds. No stridor. No wheezing, rhonchi or rales.  Chest:     Chest wall: No tenderness.  Abdominal:     General: Abdomen is flat. There is no distension.     Palpations: Abdomen is soft. There is no mass.     Tenderness: There is no abdominal tenderness. There is no right CVA tenderness, left CVA tenderness, guarding or rebound.  Musculoskeletal:     Cervical back: Normal range of motion and neck supple. No tenderness.     Right lower leg: No edema.     Left lower leg: No edema.     Comments: No calf tenderness bilaterally; entirety of left shoulder, left anterior chest, and left arm palpated and without obvious identifiable spots of localized tenderness, no overlying skin changes, no signs of joint effusion or significant swelling, patient has slightly limited abduction of the left arm but overall  range of motion is otherwise intact, 2+ radial pulses and normal sensation distally  Skin:    General: Skin is warm and dry.     Capillary Refill: Capillary refill takes less than 2 seconds.  Neurological:     Mental Status: She is alert. Mental status is at baseline.     Comments: 5/5 strength to bilateral upper and lower extremities  Psychiatric:        Mood and Affect: Mood normal.        Behavior: Behavior normal.     ED Results / Procedures / Treatments   Labs (all labs ordered are listed, but only abnormal results are displayed) Labs Reviewed  BASIC METABOLIC PANEL - Abnormal; Notable for the following components:      Result Value   Glucose, Bld 118 (*)    BUN 30 (*)    Creatinine, Ser 1.23 (*)    GFR, Estimated 42 (*)    All other components within normal limits  CBC - Abnormal; Notable for the following components:   RBC 3.59 (*)    Hemoglobin 10.2 (*)    HCT 31.6 (*)    Platelets 127 (*)    All other components within normal limits  URINALYSIS, ROUTINE W REFLEX MICROSCOPIC - Abnormal; Notable for the following components:   Protein, ur 30 (*)    All other components within normal limits  TROPONIN I (HIGH SENSITIVITY) - Abnormal; Notable for the following components:   Troponin I (High Sensitivity) 45 (*)    All other components within normal limits  TROPONIN I (HIGH SENSITIVITY) - Abnormal; Notable for the following components:   Troponin I (High Sensitivity) 51 (*)    All other components within normal limits    EKG EKG Interpretation  Date/Time:  Thursday February 28 2023 15:30:06 EST Ventricular Rate:  56 PR Interval:  182 QRS Duration: 97 QT Interval:  420 QTC Calculation: 406 R Axis:  36 Text Interpretation: Sinus rhythm Borderline repolarization abnormality since last tracing no significant change Confirmed by Malvin Johns [phone]) on 02/28/2023 5:14:00 PM  Radiology DG Chest 2 View  Result Date: 02/28/2023 CLINICAL DATA:  Left shoulder pain. EXAM:  CHEST - 2 VIEW COMPARISON:  Radiograph 08/24/2022 FINDINGS: Stable heart size allowing for AP technique. Unchanged mediastinal contours. Linear subsegmental opacity in the left upper and lower lobes typical of atelectasis. No pneumothorax or pleural effusion. Normal pulmonary vasculature. Thoracic spondylosis with anterior spurring. There is mild acromioclavicular degenerative change of both shoulders. IMPRESSION: Linear subsegmental atelectasis in the left upper and lower lobes. Electronically Signed   By: Keith Rake M.D.   On: 02/28/2023 15:10    Procedures Procedures    Medications Ordered in ED Medications - No data to display  ED Course/ Medical Decision Making/ A&P                             Medical Decision Making Amount and/or Complexity of Data Reviewed Labs: ordered. Decision-making details documented in ED Course. Radiology: ordered. Decision-making details documented in ED Course. ECG/medicine tests: ordered. Decision-making details documented in ED Course.  Risk OTC drugs. Decision regarding hospitalization.   Medical Decision Making:   Sheryll Colao is a 89 y.o. female who presented to the ED today with left shoulder and chest pain detailed above.    Additional history discussed with patient's family/caregivers.  Patient's presentation is complicated by their history of hypertension, hyperlipidemia, family history CAD.  Patient placed on continuous vitals and telemetry monitoring while in ED which was reviewed periodically.  Complete initial physical exam performed, notably the patient  was in no acute distress, neurologically intact, and did not appear to have reproducible chest or shoulder pain on palpation.  She was hypertensive but vital signs are stable.    Reviewed and confirmed nursing documentation for past medical history, family history, social history.    Initial Assessment:   With the patient's presentation of shoulder and chest pain, most likely  diagnosis is ACS. Differential diagnosis includes but is not limited to fracture, dislocation, strain, sprain, rotator cuff injury, chest wall pain, arrhythmia, electrolyte disturbance, AKI, dehydration, CVA/TIA, heart failure. This is most consistent with an acute complicated illness  Initial Plan:  Screening labs including CBC and Metabolic panel to evaluate for infectious or metabolic etiology of disease.  Urinalysis with reflex culture ordered to evaluate for UTI or relevant urologic/nephrologic pathology.  CXR to evaluate for structural/infectious intrathoracic pathology.  EKG and troponin x 2 to evaluate for cardiac pathology Objective evaluation as reviewed   HEART Score for Major Cardiac Events from [email or web address]  on 02/28/2023 ** All calculations should be rechecked by clinician prior to use **  RESULT SUMMARY: 7 points High Score (7-10 points)  Risk of MACE of 50-65%.   INPUTS: History --> 1 = Moderately suspicious EKG --> 1 = Non-specific repolarization disturbance Age --> 2 = ?65 Risk factors --> 2 = ?3 risk factors or history of atherosclerotic disease Initial troponin --> 1 = 1-3 normal limit   Initial Study Results:   Laboratory  All laboratory results reviewed without evidence of clinically relevant pathology.   Exceptions include: Creatinine 1.23, GFR 42, troponin 45 with repeat 51, hemoglobin 10.2  EKG EKG was reviewed independently. ST segments without concerns for elevations.   EKG: unchanged from previous tracings.   Radiology:  All images reviewed independently. Agree with radiology report at this  time.   DG Chest 2 View  Result Date: 02/28/2023 CLINICAL DATA:  Left shoulder pain. EXAM: CHEST - 2 VIEW COMPARISON:  Radiograph 08/24/2022 FINDINGS: Stable heart size allowing for AP technique. Unchanged mediastinal contours. Linear subsegmental opacity in the left upper and lower lobes typical of atelectasis. No pneumothorax or pleural effusion. Normal pulmonary  vasculature. Thoracic spondylosis with anterior spurring. There is mild acromioclavicular degenerative change of both shoulders. IMPRESSION: Linear subsegmental atelectasis in the left upper and lower lobes. Electronically Signed   By: Keith Rake M.D.   On: 02/28/2023 15:10      Consults: Case discussed with cardiology who recommends inpatient admission and they will follow in the hospital tomorrow for likely stress test.   Final Assessment and Plan:   This is an 89 year old female presenting to the ED for left shoulder and chest pain.  Left shoulder pain appears to be ongoing the patient was evaluated by her PCP for this and sent home with Tylenol and salve but has had no relief of this pain.  Patient denies any fall or direct injury to the shoulder.  Over the last week, she has had increasing pain in the left shoulder that radiates down the left arm and over the last 2 days she has developed pain in the left chest under her breast.  Patient describes this pain as worse with movement of the arm and trunk, however, I am unable to reproduce this pain on physical exam and my attending physician is also unable to reproduce the pain.  With patient's age and multiple risk factors, workup obtained to rule out ACS as above.  EKG is normal sinus rhythm with no acute ST-T changes, does not appear consistent with a STEMI, and is similar to previous EKG.  Creatinine is similar to patient's baseline.  Chest x-ray with possible atelectasis on the left but no focal consolidation.  She does have some cardiomegaly.  She has degenerative changes to both shoulders but no other acute findings.  She does not have any significant lower extremity edema and has no tenderness to the bilateral lower extremities.  She does not appear to be fluid overloaded.  Workup essentially unremarkable apart from elevated troponins as above.  Patient has no previous troponins to compare this to.  She has not previously had a cardiac workup.   She does have a positive family history of CAD in her son.  She has an elevated heart score at 7.  Discussed patient case with cardiology who recommended inpatient admission and they will follow and see tomorrow.  They recommended to give full dose of aspirin which was ordered but no heparin or other interventions at this time.  Also discussed case with hospitalist who will arrange transfer and admission to hospital.  Patient stable at time of disposition.   Clinical Impression:  1. Elevated troponin   2. Chest pain, unspecified type      Data Unavailable           Final Clinical Impression(s) / ED Diagnoses Final diagnoses:  Elevated troponin  Chest pain, unspecified type    Rx / DC Orders ED Discharge Orders     None         Suzzette Righter, PA-C 02/28/23 2135    Malvin Johns, MD 03/01/23 [phone]

## 2023-02-28 NOTE — H&P (Incomplete)
History and Physical      Breanna [name] [id number] DOB: 1931/12/16 DOA: 02/28/2023  PCP: Cassandria Anger, MD  Patient coming from: home   I have personally briefly reviewed patient's old medical records in [place]  Chief Complaint: left shoulder pain  HPI: Breanna [name] is a 89 y.o. female with medical history significant for essential hypertension, type 2 diabetes mellitus, CKD 3B associated baseline creatinine 1.2-1.5, anemia of chronic kidney disease associated baseline hemoglobin 10-11,b who is admitted to [facility] on 02/28/2023 by way of transfer from [facility] emergency department for further evaluation management elevated troponin after presenting from home to the latter facility complaining of left shoulder pain.  The patient reports 3 to 4 months of recurrent sharp, nonradiating left shoulder discomfort, the worsens with certain movements of the left upper extremity.  Denies any preceding trauma to the left shoulder or left upper extremity.  The pain does not intensify with ambulation.  She notes poor pain control relating to her left shoulder discomfort with outpatient analgesic intervention, including gabapentin.  She was recently seen for this complaint of 3 to 4 months of left shoulder discomfort, at which time she was provided with referral to physical therapy, but is not yet been able to establish with PT.  She notes a history of right-sided rotator cuff tear, prompting repair of her right rotator cuff in 2008.  Has not previously undergone surgical intervention on the left shoulder.  Denies any associated acute focal weakness involving the left upper extremity nor any acute focal numbness or paresthesias.  She notes that her 3 to 4 months of left shoulder pain is not associate with any shortness of breath, palpitations, diaphoresis, nausea, vomiting, dizziness, presyncope, or syncope.  Denies any associated left shoulder erythema, increased warmth, or  gross swelling.  Not associate with any subjective fever, chills, rigors, or generalized myalgias.  Denies any history of IV drug abuse.  Denies any recent cardiac ischemic evaluation, including no recent stress test.  Per chart review, no prior echocardiogram on file.  In the setting of suboptimal pain control relating to the left shoulder, she presents to [facility] emergency department today for further evaluation and management thereof.      [facility] ED Course:  Vital signs in the ED were notable for the following: Afebrile; heart rate [id number], systolic blood pressures in the 140s to 160s; respiratory rate 18-21, oxygen saturation 96 to 100% on room air.  Labs were notable for the following: BMP notable for the following: Sodium 140, potassium 4.0, creatinine 1.23 compared to most recent prior value of 1.36 on 02/13/2023, glucose 118.  High-sensitivity troponin I initially 45, with repeat value trending up slightly to 51, in the absence of any prior high sensitive troponin I data points for reported comparison.  CBC notable for white blood cell count 6000, hemoglobin 10.2 associated normocytic/normochromic findings as well as nonelevated RDW, and relative demonstrates prior hemoglobin Digitaline of 10.9 in September 2023.  Urinalysis showed no evidence of white blood cells, was nitrate/leukocyte esterase negative, will demonstrating the presence of hyaline cast.  Per my interpretation, EKG in ED demonstrated the following: Sinus rhythm with heart rate 56, normal intervals, no evidence of T wave or ST changes, including no evidence of ST elevation.  Imaging and additional notable ED work-up: 2 view chest x-ray, performed radiology read, shows linear subsegmental atelectasis in the left upper and lower lobes, in the absence of any evidence of infiltrate, edema, effusion, or pneumothorax.  EDP discussed case with on-call cardiology fellow, who recommended admission to [facility], and conveyed the  cardiology will see the patient in the morning (3/8), suspecting that they will pursue stress test at that time.   While in the ED, the following were administered: Full dose aspirin x 1.  Subsequently, the patient was admitted to [facility] for further evaluation and management of presenting elevated troponin in the context of presenting 3 to 4 months of nontraumatic left shoulder discomfort.  ***red   Review of Systems: As per HPI otherwise 10 point review of systems negative.   Past Medical History:  Diagnosis Date  . Adrenal adenoma 2006  . Boils 2009  . Cholelithiasis    asympt. w/normal HIDA 03/2010  . CVA (cerebral infarction) 2010   Cerebellar  . Diverticulitis   . Esophageal spasm 2011  . GERD (gastroesophageal reflux disease)   . Gout   . History of colon polyps   . HTN (hypertension)   . Hydronephrosis    LEFT/ Surgical intervention  . Hyperlipidemia   . LBP (low back pain)   . Osteoarthritis   . Pulmonary HTN ([place])   . Stress   . Type II or unspecified type diabetes mellitus without mention of complication, not stated as uncontrolled     Past Surgical History:  Procedure Laterality Date  . ABDOMINAL HYSTERECTOMY    . APPENDECTOMY     2020  . BACK SURGERY    . BREAST BIOPSY     RIGHT  . CATARACT EXTRACTION, BILATERAL    . COLECTOMY  2006   Sigmoid  . FOOT SURGERY     BILATERAL  . HAMMER TOE SURGERY    . INTRAMEDULLARY (IM) NAIL INTERTROCHANTERIC Right 07/21/2020   Procedure: INTRAMEDULLARY (IM) NAIL INTERTROCHANTRIC;  Surgeon: Renette Butters, MD;  Location: [facility];  Service: Orthopedics;  Laterality: Right;  . ROTATOR CUFF REPAIR  2008   RIGHT  . TOTAL KNEE ARTHROPLASTY  2003   LEFT  . VARICOSE VEIN SURGERY     vein stripping/lower extremities    Social History:  reports that she has never smoked. She has never used smokeless tobacco. She reports that she does not drink alcohol and does not use drugs.   Allergies  Allergen Reactions  .  Verapamil Shortness Of Breath    REACTION: SOB  . Aspirin Itching    But tolerates low dose  . Atenolol     REACTION: fatigue  . Codeine Itching  . Codeine Sulfate   . Hydrochlorothiazide     REACTION: gout  . Hydrocodone       side effects - hallucinations  . Ibuprofen     Upset stomach w/high doses  . Lisinopril     REACTION: cough  . Onion Other (See Comments)    Dry mouth/ gets sores  . Shellfish Allergy Swelling    Patient stated she does not eat shellfish, she swells up on different parts of the body  . Valsartan Itching  . Adhesive  [Tape] Rash  . Other Rash    Family History  Problem Relation Age of Onset  . Prostate cancer Father   . Hypertension Father   . Cancer Father        prostate  . Heart disease Mother   . Diabetes Mother   . Diabetes Other        1st degree relative  . Heart disease Other   . Hypertension Other   . Prostate cancer Maternal Uncle   .  Cancer Maternal Uncle        prostate  . Breast cancer Daughter   . Cancer Daughter        breast  . Colon cancer Neg Hx     Family history reviewed and not pertinent ***   Prior to Admission medications   Medication Sig Start Date End Date Taking? Authorizing Provider  acetaminophen (TYLENOL) 325 MG tablet Take 2 tablets (650 mg total) by mouth every 6 (six) hours as needed for moderate pain or fever. 07/29/20   Nolberto Hanlon, MD  Alcohol Swabs (TRUE COMFORT ALCOHOL PREP PADS) 70 % PADS Use as directed 04/26/21   Plotnikov, Evie Lacks, MD  allopurinol (ZYLOPRIM) 100 MG tablet Take 1 tablet (100 mg total) by mouth daily. 04/11/22   Plotnikov, Evie Lacks, MD  amLODipine (NORVASC) 5 MG tablet Take 1 tablet by mouth daily.    [provider]  ascorbic acid (VITAMIN C) 500 MG tablet TAKE 1 TABLET BY MOUTH EVERY DAY 02/05/22   Plotnikov, Evie Lacks, MD  ASPIRIN LOW DOSE 81 MG tablet TAKE 1 TABLET BY MOUTH DAILY. SWALLOW WHOLE. 10/01/22   Plotnikov, Evie Lacks, MD  atropine 1 % ophthalmic solution  atropine 1 % eye drops 01/16/21   [provider]  benzonatate (TESSALON) 200 MG capsule     [provider]  Blood Glucose Calibration (TRUE METRIX LEVEL 1) Low SOLN USE AS DIRECTED 04/20/21   Plotnikov, Evie Lacks, MD  brimonidine (ALPHAGAN) 0.2 % ophthalmic solution Place 1 drop into the right eye 2 times daily.    [provider]  calcium carbonate (OS-CAL) 600 MG tablet Take 1 tablet (600 mg total) by mouth daily. 03/07/21   Plotnikov, Evie Lacks, MD  calcium carbonate (OSCAL) 1500 (600 Ca) MG TABS tablet TAKE 1 TABLET BY MOUTH EVERY DAY 03/05/22   Plotnikov, Evie Lacks, MD  carvedilol (COREG) 25 MG tablet Take 1 tablet (25 mg total) by mouth 2 (two) times daily with a meal. 08/24/20   Plotnikov, Evie Lacks, MD  cephALEXin (KEFLEX) 500 MG capsule Take 1 capsule by mouth 2 (two) times daily.    [provider]  Cholecalciferol (VITAMIN D3) 25 MCG (1000 UT) CAPS Take 1 capsule (1,000 Units total) by mouth daily. 04/13/22   Plotnikov, Evie Lacks, MD  diclofenac sodium (VOLTAREN) 1 % GEL Apply 2 g topically 2 (two) times daily. 07/25/17   Plotnikov, Evie Lacks, MD  docusate sodium (COLACE) 100 MG capsule TAKE 1 CAPSULE BY MOUTH TWICE A DAY 07/27/22   Plotnikov, Evie Lacks, MD  dorzolamide-timolol (COSOPT) 22.3-6.8 MG/ML ophthalmic solution Place 1 drop into both eyes 2 (two) times daily.     [provider]  feeding supplement, ENSURE ENLIVE, (ENSURE ENLIVE) LIQD Take 237 mLs by mouth daily at 2 PM. 07/30/20   Nolberto Hanlon, MD  feeding supplement, GLUCERNA SHAKE, (GLUCERNA SHAKE) LIQD Take 237 mLs by mouth 2 (two) times daily between meals. 07/29/20   Nolberto Hanlon, MD  gabapentin (NEURONTIN) 300 MG capsule Gabapentin 300 mg - take one with breakfast, one with supper and one at 3 am if woke up with pain 01/23/23   Plotnikov, Evie Lacks, MD  glucose blood (GNP TRUE METRIX GLUCOSE STRIPS) test strip Use to check blood sugars twice a day 04/26/21   Plotnikov, Evie Lacks, MD   latanoprost (XALATAN) 0.005 % ophthalmic solution Place 1 drop into both eyes at bedtime.  04/26/16   [provider]  lidocaine-prilocaine (EMLA) cream Apply 1 application topically  as needed. 06/15/21   Landis Martins, DPM  losartan (COZAAR) 25 MG tablet Take 2 tablets by mouth daily.    [provider]  losartan (COZAAR) 50 MG tablet TAKE 1 TABLET BY MOUTH EVERY DAY 06/04/22   Plotnikov, Evie Lacks, MD  metFORMIN (GLUCOPHAGE) 500 MG tablet Take 1 tablet (500 mg total) by mouth daily with breakfast. Must keep scheduled appt for future refills 08/30/22   Plotnikov, Evie Lacks, MD  NON FORMULARY Place 10 drops under the tongue daily as needed (Pain). CBD oil    [provider]  pantoprazole (PROTONIX) 40 MG tablet TAKE 1 TABLET BY MOUTH EVERY DAY 12/16/22   Plotnikov, Evie Lacks, MD  polyethylene glycol (MIRALAX / GLYCOLAX) 17 g packet Take 17 g by mouth daily as needed for mild constipation. 07/29/20   Nolberto Hanlon, MD  senna (SENOKOT) 8.6 MG TABS tablet Take 1 tablet (8.6 mg total) by mouth 2 (two) times daily. 07/29/20   Nolberto Hanlon, MD  sertraline (ZOLOFT) 100 MG tablet TAKE 1 TABLET BY MOUTH EVERY DAY 11/12/22   Plotnikov, Evie Lacks, MD  spironolactone (ALDACTONE) 25 MG tablet TAKE 1 TAB BY MOUTH MON - WED - FRI 11/12/22   Plotnikov, Evie Lacks, MD  TRUEplus Lancets 33G MISC USE AS DIRECTED TO CHECK BLOOD SUGAR TWICE A DAY 07/23/22   Plotnikov, Evie Lacks, MD  valACYclovir (VALTREX) 500 MG tablet Take 1 tablet (500 mg total) by mouth 2 (two) times daily. 08/22/21   Plotnikov, Evie Lacks, MD  vitamin B-12 (CYANOCOBALAMIN) 1000 MCG tablet TAKE 1 TABLET (1,000 MCG TOTAL) BY MOUTH EVERY DAY 05/04/22   Plotnikov, Evie Lacks, MD     Objective    Physical Exam: Vitals:   02/28/23 1700 02/28/23 1800 02/28/23 2045 02/28/23 2233  BP: (!) 149/84 104/66 (!) 165/85 (!) 164/78  Pulse: (!) 58 (!) 56 (!) 57 (!) 51  Resp: (!) '21 18 19 20  '$ Temp:  98.1 F (36.7 C)  98.3 F (36.8 C)   TempSrc:  Oral    SpO2: 95% 96% 98% 100%  Weight:      Height:        General: appears to be stated age; alert, oriented Skin: warm, dry, no rash Head:  AT/[place] Mouth:  Oral mucosa membranes appear moist, normal dentition Neck: supple; trachea midline Heart:  RRR; did not appreciate any M/R/G Lungs: CTAB, did not appreciate any wheezes, rales, or rhonchi Abdomen: + BS; soft, ND, NT Vascular: 2+ pedal pulses b/l; 2+ radial pulses b/l Extremities: no peripheral edema, no muscle wasting Neuro: strength and sensation intact in upper and lower extremities b/l    *** Neuro: 5/5 strength of the proximal and distal flexors and extensors of the upper and lower extremities bilaterally; sensation intact in upper and lower extremities b/l; cranial nerves II through XII grossly intact; no pronator drift; no evidence suggestive of slurred speech, dysarthria, or facial droop; Normal muscle tone. No tremors. *** Neuro: In the setting of the patient's current mental status and associated inability to follow instructions, unable to perform full neurologic exam at this time.  As such, assessment of strength, sensation, and cranial nerves is limited at this time. Patient noted to spontaneously move all 4 extremities. No tremors.  ***    Labs on Admission: I have personally reviewed following labs and imaging studies  CBC: Recent Labs  Lab 02/28/23 1540  WBC 6.0  HGB 10.2*  HCT 31.6*  MCV 88.0  PLT [id number]*   Basic Metabolic  Panel: Recent Labs  Lab 02/28/23 1540  NA 140  K 4.0  CL 106  CO2 25  GLUCOSE 118*  BUN 30*  CREATININE 1.23*  CALCIUM 9.5   GFR: Estimated Creatinine Clearance: 24.5 mL/min (A) (by C-G formula based on SCr of 1.23 mg/dL (H)). Liver Function Tests: No results for input(s): "AST", "ALT", "ALKPHOS", "BILITOT", "PROT", "ALBUMIN" in the last 168 hours. No results for input(s): "LIPASE", "AMYLASE" in the last 168 hours. No results for input(s): "AMMONIA" in the last  168 hours. Coagulation Profile: No results for input(s): "INR", "PROTIME" in the last 168 hours. Cardiac Enzymes: No results for input(s): "CKTOTAL", "CKMB", "CKMBINDEX", "TROPONINI" in the last 168 hours. BNP (last 3 results) No results for input(s): "PROBNP" in the last 8760 hours. HbA1C: No results for input(s): "HGBA1C" in the last 72 hours. CBG: No results for input(s): "GLUCAP" in the last 168 hours. Lipid Profile: No results for input(s): "CHOL", "HDL", "LDLCALC", "TRIG", "CHOLHDL", "LDLDIRECT" in the last 72 hours. Thyroid Function Tests: No results for input(s): "TSH", "T4TOTAL", "FREET4", "T3FREE", "THYROIDAB" in the last 72 hours. Anemia Panel: No results for input(s): "VITAMINB12", "FOLATE", "FERRITIN", "TIBC", "IRON", "RETICCTPCT" in the last 72 hours. Urine analysis:    Component Value Date/Time   COLORURINE YELLOW 02/28/2023 1908   APPEARANCEUR CLEAR 02/28/2023 1908   LABSPEC 1.014 02/28/2023 1908   PHURINE 7.5 02/28/2023 1908   GLUCOSEU NEGATIVE 02/28/2023 1908   GLUCOSEU NEGATIVE 11/29/2021 1134   HGBUR NEGATIVE 02/28/2023 1908   BILIRUBINUR NEGATIVE 02/28/2023 1908   KETONESUR NEGATIVE 02/28/2023 1908   PROTEINUR 30 (A) 02/28/2023 1908   UROBILINOGEN 0.2 11/29/2021 1134   NITRITE NEGATIVE 02/28/2023 1908   LEUKOCYTESUR NEGATIVE 02/28/2023 1908    Radiological Exams on Admission: DG Chest 2 View  Result Date: 02/28/2023 CLINICAL DATA:  Left shoulder pain. EXAM: CHEST - 2 VIEW COMPARISON:  Radiograph 08/24/2022 FINDINGS: Stable heart size allowing for AP technique. Unchanged mediastinal contours. Linear subsegmental opacity in the left upper and lower lobes typical of atelectasis. No pneumothorax or pleural effusion. Normal pulmonary vasculature. Thoracic spondylosis with anterior spurring. There is mild acromioclavicular degenerative change of both shoulders. IMPRESSION: Linear subsegmental atelectasis in the left upper and lower lobes. Electronically Signed    By: Keith Rake M.D.   On: 02/28/2023 15:10      Assessment/Plan    Principal Problem:   Left shoulder pain  ***      ***          ***           ***          ***          ***          ***          ***          ***          ***          ***          ***          ***          ***     DVT prophylaxis: SCD's ***  Code Status: Full code*** Family Communication: none*** Disposition Plan: Per Rounding Team Consults called: none***;  Admission status: ***    I SPENT GREATER THAN 75 *** MINUTES IN CLINICAL CARE TIME/MEDICAL DECISION-MAKING IN COMPLETING THIS ADMISSION.     [place] DO [facility]  02/28/2023, 11:19 PM   ***

## 2023-02-28 NOTE — Progress Notes (Signed)
Plan of Care Note for accepted transfer   Patient: Breanna [name] MRN: [id number]   [place]: 02/28/2023  Facility requesting transfer: [facility]   Requesting Provider: Dallie Piles, PA   Reason for transfer: Elevated troponin   Facility course: 89 yr old lady with HTN, T2DM, and CKD 3b presents with atraumatic left shoulder pain and is found to have mild troponin elevation. She was seen 02/25/23 for 3 months of left shoulder pain that is worse with certain movements, and was referred for PT.   In the ED, there were no acute ischemic features on EKG, no acute CXR findings, and troponin was elevated to 45 and then 51.   ED PA discussed with cardiology fellow who recommended medical admission.   Plan of care: The patient is accepted for admission to Telemetry unit, at [facility].   Author: Vianne Bulls, MD 02/28/2023  Check [email or web address] for on-call coverage.  Nursing staff, Please call [place] number on Amion as soon as patient's arrival, so appropriate admitting provider can evaluate the pt.

## 2023-03-01 ENCOUNTER — Observation Stay (HOSPITAL_COMMUNITY): Payer: Medicare PPO

## 2023-03-01 ENCOUNTER — Observation Stay (HOSPITAL_BASED_OUTPATIENT_CLINIC_OR_DEPARTMENT_OTHER): Payer: Medicare PPO

## 2023-03-01 DIAGNOSIS — E1121 Type 2 diabetes mellitus with diabetic nephropathy: Secondary | ICD-10-CM | POA: Diagnosis not present

## 2023-03-01 DIAGNOSIS — N1832 Chronic kidney disease, stage 3b: Secondary | ICD-10-CM | POA: Diagnosis present

## 2023-03-01 DIAGNOSIS — R609 Edema, unspecified: Secondary | ICD-10-CM

## 2023-03-01 DIAGNOSIS — R7989 Other specified abnormal findings of blood chemistry: Secondary | ICD-10-CM

## 2023-03-01 DIAGNOSIS — Z862 Personal history of diseases of the blood and blood-forming organs and certain disorders involving the immune mechanism: Secondary | ICD-10-CM | POA: Diagnosis not present

## 2023-03-01 DIAGNOSIS — F329 Major depressive disorder, single episode, unspecified: Secondary | ICD-10-CM | POA: Diagnosis not present

## 2023-03-01 DIAGNOSIS — M75102 Unspecified rotator cuff tear or rupture of left shoulder, not specified as traumatic: Secondary | ICD-10-CM | POA: Diagnosis not present

## 2023-03-01 DIAGNOSIS — K219 Gastro-esophageal reflux disease without esophagitis: Secondary | ICD-10-CM | POA: Diagnosis not present

## 2023-03-01 DIAGNOSIS — F32A Depression, unspecified: Secondary | ICD-10-CM | POA: Diagnosis present

## 2023-03-01 DIAGNOSIS — I10 Essential (primary) hypertension: Secondary | ICD-10-CM | POA: Diagnosis not present

## 2023-03-01 DIAGNOSIS — R001 Bradycardia, unspecified: Secondary | ICD-10-CM | POA: Diagnosis not present

## 2023-03-01 DIAGNOSIS — Z79899 Other long term (current) drug therapy: Secondary | ICD-10-CM | POA: Diagnosis not present

## 2023-03-01 DIAGNOSIS — M25512 Pain in left shoulder: Secondary | ICD-10-CM | POA: Diagnosis not present

## 2023-03-01 DIAGNOSIS — Z96652 Presence of left artificial knee joint: Secondary | ICD-10-CM | POA: Diagnosis not present

## 2023-03-01 DIAGNOSIS — M19012 Primary osteoarthritis, left shoulder: Secondary | ICD-10-CM | POA: Diagnosis not present

## 2023-03-01 DIAGNOSIS — R079 Chest pain, unspecified: Secondary | ICD-10-CM

## 2023-03-01 DIAGNOSIS — Z8673 Personal history of transient ischemic attack (TIA), and cerebral infarction without residual deficits: Secondary | ICD-10-CM | POA: Diagnosis not present

## 2023-03-01 DIAGNOSIS — R778 Other specified abnormalities of plasma proteins: Secondary | ICD-10-CM | POA: Diagnosis not present

## 2023-03-01 DIAGNOSIS — E1122 Type 2 diabetes mellitus with diabetic chronic kidney disease: Secondary | ICD-10-CM | POA: Diagnosis not present

## 2023-03-01 DIAGNOSIS — R0789 Other chest pain: Secondary | ICD-10-CM | POA: Diagnosis not present

## 2023-03-01 DIAGNOSIS — N189 Chronic kidney disease, unspecified: Secondary | ICD-10-CM | POA: Diagnosis not present

## 2023-03-01 DIAGNOSIS — I129 Hypertensive chronic kidney disease with stage 1 through stage 4 chronic kidney disease, or unspecified chronic kidney disease: Secondary | ICD-10-CM | POA: Diagnosis not present

## 2023-03-01 LAB — CBC WITH DIFFERENTIAL/PLATELET
Abs Immature Granulocytes: 0.01 10*3/uL (ref 0.00–0.07)
Basophils Absolute: 0 10*3/uL (ref 0.0–0.1)
Basophils Relative: 0 %
Eosinophils Absolute: 0.2 10*3/uL (ref 0.0–0.5)
Eosinophils Relative: 4 %
HCT: 30.8 % — ABNORMAL LOW (ref 36.0–46.0)
Hemoglobin: 9.8 g/dL — ABNORMAL LOW (ref 12.0–15.0)
Immature Granulocytes: 0 %
Lymphocytes Relative: 20 %
Lymphs Abs: 0.8 10*3/uL (ref 0.7–4.0)
MCH: 28.6 pg (ref 26.0–34.0)
MCHC: 31.8 g/dL (ref 30.0–36.0)
MCV: 89.8 fL (ref 80.0–100.0)
Monocytes Absolute: 0.5 10*3/uL (ref 0.1–1.0)
Monocytes Relative: 13 %
Neutro Abs: 2.7 10*3/uL (ref 1.7–7.7)
Neutrophils Relative %: 63 %
Platelets: 116 10*3/uL — ABNORMAL LOW (ref 150–400)
RBC: 3.43 MIL/uL — ABNORMAL LOW (ref 3.87–5.11)
RDW: 14.7 % (ref 11.5–15.5)
WBC: 4.2 10*3/uL (ref 4.0–10.5)
nRBC: 0 % (ref 0.0–0.2)

## 2023-03-01 LAB — COMPREHENSIVE METABOLIC PANEL
ALT: 10 U/L (ref 0–44)
AST: 18 U/L (ref 15–41)
Albumin: 2.9 g/dL — ABNORMAL LOW (ref 3.5–5.0)
Alkaline Phosphatase: 71 U/L (ref 38–126)
Anion gap: 7 (ref 5–15)
BUN: 26 mg/dL — ABNORMAL HIGH (ref 8–23)
CO2: 25 mmol/L (ref 22–32)
Calcium: 8.9 mg/dL (ref 8.9–10.3)
Chloride: 106 mmol/L (ref 98–111)
Creatinine, Ser: 0.97 mg/dL (ref 0.44–1.00)
GFR, Estimated: 56 mL/min — ABNORMAL LOW (ref >60)
Glucose, Bld: 99 mg/dL (ref 70–99)
Potassium: 4.2 mmol/L (ref 3.5–5.1)
Sodium: 138 mmol/L (ref 135–145)
Total Bilirubin: 0.5 mg/dL (ref 0.3–1.2)
Total Protein: 6.4 g/dL — ABNORMAL LOW (ref 6.5–8.1)

## 2023-03-01 LAB — GLUCOSE, CAPILLARY
Glucose-Capillary: 91 mg/dL (ref 70–99)
Glucose-Capillary: 85 mg/dL (ref 70–99)
Glucose-Capillary: 99 mg/dL (ref 70–99)
Glucose-Capillary: 102 mg/dL — ABNORMAL HIGH (ref 70–99)

## 2023-03-01 LAB — ECHOCARDIOGRAM COMPLETE
Area-P 1/2: 3.03 cm2
Calc EF: 63.1 %
Height: 62 in
MV M vel: 6.31 m/s
MV Peak grad: 159.3 mmHg
P 1/2 time: 763 msec
Radius: 0.5 cm
S' Lateral: 2.9 cm
Single Plane A2C EF: 59.5 %
Single Plane A4C EF: 65.4 %
Weight: 1820.12 oz

## 2023-03-01 LAB — MAGNESIUM: Magnesium: 2 mg/dL (ref 1.7–2.4)

## 2023-03-01 LAB — LIPID PANEL
Cholesterol: 127 mg/dL (ref 0–200)
HDL: 53 mg/dL (ref >40)
LDL Cholesterol: 66 mg/dL (ref 0–99)
Total CHOL/HDL Ratio: 2.4 RATIO
Triglycerides: 42 mg/dL (ref <150)
VLDL: 8 mg/dL (ref 0–40)

## 2023-03-01 LAB — TROPONIN I (HIGH SENSITIVITY): Troponin I (High Sensitivity): 45 ng/L — ABNORMAL HIGH (ref <18)

## 2023-03-01 LAB — D-DIMER, QUANTITATIVE: D-Dimer, Quant: 2.13 ug/mL-FEU — ABNORMAL HIGH (ref 0.00–0.50)

## 2023-03-01 MED ORDER — IOHEXOL 350 MG/ML SOLN
75.0000 mL | Freq: Once | INTRAVENOUS | Status: AC | PRN
  Administered 2023-03-01: 75 mL via INTRAVENOUS

## 2023-03-01 MED ORDER — AMLODIPINE BESYLATE 5 MG PO TABS
5.0000 mg | ORAL_TABLET | Freq: Every day | ORAL | Status: DC
  Administered 2023-03-01 – 2023-03-03 (×3): 5 mg via ORAL
  Filled 2023-03-01 (×3): qty 1

## 2023-03-01 MED ORDER — SODIUM CHLORIDE (PF) 0.9 % IJ SOLN
INTRAMUSCULAR | Status: AC
  Filled 2023-03-01: qty 50

## 2023-03-01 MED ORDER — HYDRALAZINE HCL 20 MG/ML IJ SOLN: 10.0000 mg | INTRAMUSCULAR | Status: DC | PRN

## 2023-03-01 MED ORDER — LOSARTAN POTASSIUM 50 MG PO TABS
50.0000 mg | ORAL_TABLET | Freq: Once | ORAL | Status: AC
  Administered 2023-03-01: 50 mg via ORAL
  Filled 2023-03-01: qty 1

## 2023-03-01 MED ORDER — SPIRONOLACTONE 25 MG PO TABS
25.0000 mg | ORAL_TABLET | ORAL | Status: DC
  Administered 2023-03-01: 25 mg via ORAL
  Filled 2023-03-01: qty 1

## 2023-03-01 MED ORDER — LOSARTAN POTASSIUM 50 MG PO TABS
100.0000 mg | ORAL_TABLET | Freq: Every day | ORAL | Status: DC
  Administered 2023-03-02 – 2023-03-03 (×2): 100 mg via ORAL
  Filled 2023-03-01 (×2): qty 2

## 2023-03-01 MED ORDER — HYDRALAZINE HCL 20 MG/ML IJ SOLN
10.0000 mg | Freq: Four times a day (QID) | INTRAMUSCULAR | Status: DC | PRN
  Administered 2023-03-02: 10 mg via INTRAVENOUS
  Filled 2023-03-01: qty 1

## 2023-03-01 NOTE — Progress Notes (Signed)
Bilateral lower extremity venous duplex has been completed. Preliminary results can be found in CV Proc through chart review.   03/01/23 2:02 PM Breanna [name] RVT

## 2023-03-01 NOTE — TOC Initial Note (Signed)
Transition of Care [facility]) - Initial/Assessment Note    Patient Details  Name: Breanna [name] MRN: [id number] Date of Birth: April 20, 1931  Transition of Care [facility]) CM/SW Contact:    Dessa Phi, RN Phone Number: 03/01/2023, 12:12 PM  Clinical Narrative:  Will await PT recc.                 Expected Discharge Plan:  (TBD) Barriers to Discharge: Continued Medical Work up   Patient Goals and CMS Choice            Expected Discharge Plan and Services                                              Prior Living Arrangements/Services                       Activities of Daily Living Home Assistive Devices/Equipment: Cane (specify quad or straight) ADL Screening (condition at time of admission) Patient's cognitive ability adequate to safely complete daily activities?: Yes Is the patient deaf or have difficulty hearing?: No Does the patient have difficulty seeing, even when wearing glasses/contacts?: Yes Does the patient have difficulty concentrating, remembering, or making decisions?: No Patient able to express need for assistance with ADLs?: Yes Does the patient have difficulty dressing or bathing?: No Independently performs ADLs?: Yes (appropriate for developmental age) Does the patient have difficulty walking or climbing stairs?: Yes Weakness of Legs: Both Weakness of Arms/Hands: None  Permission Sought/Granted                  Emotional Assessment              Admission diagnosis:  Elevated troponin [R79.89] Left shoulder pain [M25.512] Chest pain, unspecified type [R07.9] Patient Active Problem List   Diagnosis Date Noted   Elevated troponin 03/01/2023   Stage 3b chronic kidney disease (CKD) ([place]) 03/01/2023   Depression 03/01/2023   Left shoulder pain 02/28/2023   Pes anserinus bursitis of both knees 05/23/2022   Gait disorder 03/07/2022   Falls frequently 11/29/2021   Vision loss 11/29/2021   Parkinsonian features 05/23/2021    Atherosclerosis of aorta ([place]) 05/23/2021   Anemia 08/18/2020   Constipation 08/18/2020   Hip fracture requiring operative repair ([place]) 07/21/2020   Sinus bradycardia 07/21/2020   Hip pain 07/06/2020   Fall against object 12/25/2018   Head contusion 12/25/2018   Acute pain of right knee 09/28/2018   Well adult exam 05/13/2018   Cold sore 05/13/2018   Night sweats 04/04/2018   Breast symptom 10/30/2017   Diarrhea 08/21/2017   UTI (urinary tract infection) 08/21/2017   Lung nodule 08/21/2017   FTT (failure to thrive) in adult 08/21/2017   Shortness of breath 08/13/2017   Cough 08/13/2017   Fatigue 08/13/2017   Fever 08/13/2017   Blurry vision 08/13/2017   Shoulder pain, right 07/25/2017   Callus of foot 06/07/2017   Need for prophylactic vaccination and inoculation against influenza 11/21/2016   Trigger finger, acquired 11/12/2016   Meteorism 07/11/2016   Dark stools 01/10/2016   Osteoporosis 07/13/2015   Fall at home 01/13/2015   Cornea disorder 11/22/2014   Primary open angle glaucoma of both eyes, indeterminate stage 11/22/2014   Secondary corneal edema 10/14/2014   Swelling of left knee joint 04/07/2014   Grief 01/04/2014   Syncope 01/04/2014  Thoracic spine pain 10/22/2013   Hypernatremia 07/07/2013   Viral intestinal infection 03/25/2013   Dehydration, moderate 03/24/2013   Food poisoning 03/24/2013   Headache(784.0) 12/22/2012   URI (upper respiratory infection) 10/21/2012   Diabetic neuropathy ([place]) 07/30/2012   Neck pain on right side 07/30/2012   Cerumen impaction 07/17/2012   Cystoid macular edema 06/19/2012   History of corneal transplant 06/19/2012   Weight loss 04/10/2012   Wrist pain, right 04/06/2011   PARESTHESIA 01/03/2011   Full incontinence of feces 01/01/2011   ABDOMINAL PAIN RIGHT UPPER QUADRANT 01/01/2011   LEG PAIN, LEFT 10/02/2010   History of cardioembolic cerebrovascular accident (CVA) 03/27/2010   GERD (gastroesophageal reflux disease)  03/27/2010   DIVERTICULOSIS-COLON 03/27/2010   CHOLELITHIASIS 03/27/2010   Cerebral artery occlusion with cerebral infarction ([place]) 03/27/2010   LOW BACK PAIN 09/07/2009   History of anemia due to chronic kidney disease 06/14/2009   DYSPHAGIA 05/16/2009   Dysphagia 05/16/2009   Dyslipidemia 11/06/2007   Hyperlipidemia 11/06/2007   DM2 (diabetes mellitus, type 2) ([place]) 07/29/2007   Gout 07/29/2007   Essential hypertension 07/29/2007   Hydronephrosis 07/29/2007   Osteoarthritis 07/29/2007   COLONIC POLYPS, HX OF 07/29/2007   DIVERTICULITIS, HX OF 07/29/2007   PCP:  Cassandria Anger, MD Pharmacy:   [id number]: [phone]: [phone] [facility] #[phone]- [place] [address] [place]Phone: [phone]: [phone] [facility] - [place]Phone: [phone]: [phone]    Social Determinants of Health (SDOH) Social History: SDOH Screenings   Food Insecurity: No Food Insecurity (02/28/2023)  Housing: Low Risk  (02/28/2023)  Transportation Needs: No Transportation Needs (02/28/2023)  Utilities: Not At Risk (02/28/2023)  Alcohol Screen: Low Risk  (11/05/2022)  Depression (PHQ2-9): Low Risk  (02/25/2023)  Financial Resource Strain: Low Risk  (11/05/2022)  Physical Activity: Inactive (11/05/2022)  Social Connections: Moderately Isolated (11/05/2022)  Stress: No Stress Concern Present (11/05/2022)  Tobacco Use: Low Risk  (02/28/2023)   SDOH Interventions:     Readmission Risk Interventions     No data to display

## 2023-03-01 NOTE — Evaluation (Signed)
Physical Therapy Evaluation Patient Details Name: Breanna [name] MRN: [id number] DOB: [birth year] Today's Date: 03/01/2023  History of Present Illness  Pt is a 89 y.o. female presenting with atypical chest pain, elevated troponin and 3 to 4 months of recurrent sharp, nonradiating left shoulder discomfort that worsens with certain movements of the left upper extremity. Left shoulder x-ray showed mild superior subluxation of the humeral head relative to glenoid can be seen with rotator cuff pathology, no fracture or dislocation. PMH significant for HTN, diabetes mellitus type 2, CKD 3B, anemia, cerebellar CVA (2010), GERD, gout, L TKA (2003), and R RTC repair (2008) .   Clinical Impression  Pt is an 89 y.o. female with above HPI resulting in the deficits listed below (see PT Problem List). Pt lives with her granddaughter and is independent with use of cane for mobility at baseline. Pt performed sit to stand transfers with MIN guard for safety and cues for safe hand placement. Pt ambulated total of ~36f with MIN guard for stability and HHA from therapist to navigate environment due to baseline blindness. Pt reports granddaughter does not work and can be around when needed and son check in everyday and lives 5 min away. Pt with complaints of L shoulder pain, especially with shoulder flexion (limited ROM). She was recently seen for this complaint and was recommended physical therapy however has not been able to establish with PT, recommend OPPT follow up. Pt will benefit from skilled PT to maximize functional mobility to increase independence.         Recommendations for follow up therapy are one component of a multi-disciplinary discharge planning process, led by the attending physician.  Recommendations may be updated based on patient status, additional functional criteria and insurance authorization.  Follow Up Recommendations Outpatient PT (OPPT if desired for L shoulder)      Assistance Recommended  at Discharge Intermittent Supervision/Assistance  Patient can return home with the following  A little help with walking and/or transfers;A little help with bathing/dressing/bathroom;Help with stairs or ramp for entrance;Assist for transportation;Direct supervision/assist for medications management;Direct supervision/assist for financial management;Assistance with cooking/housework    Equipment Recommendations None recommended by PT  Recommendations for Other Services       Functional Status Assessment Patient has had a recent decline in their functional status and demonstrates the ability to make significant improvements in function in a reasonable and predictable amount of time.     Precautions / Restrictions Precautions Precautions: Fall Precaution Comments: legally blind Restrictions Weight Bearing Restrictions: No      Mobility  Bed Mobility Overal bed mobility: Needs Assistance Bed Mobility: Supine to Sit     Supine to sit: Supervision     General bed mobility comments: increased time due to visual deficits    Transfers Overall transfer level: Needs assistance Equipment used:  (HHA) Transfers: Sit to/from Stand Sit to Stand: Min guard           General transfer comment: use of HHA with increased time to steady in standing. Pt reports she has her cane right at bedside.    Ambulation/Gait Ambulation/Gait assistance: Min guard Gait Distance (Feet): 50 Feet Assistive device:  (HHA) Gait Pattern/deviations: Step-through pattern, Decreased stride length Gait velocity: decr     General Gait Details: use of HHA and MIN guard fot stability. HHA due to baseline visual deficits to guide pt in hallway. Reports she is feeling weaker since she has been lying in bed for a day. Trys to stay active during  the day typically.  Stairs            Wheelchair Mobility    Modified Rankin (Stroke Patients Only)       Balance Overall balance assessment: Needs  assistance Sitting-balance support: Feet supported Sitting balance-Leahy Scale: Good     Standing balance support: Single extremity supported, During functional activity Standing balance-Leahy Scale: Fair                               Pertinent Vitals/Pain Pain Assessment Pain Assessment: 0-10 Pain Score: 3  Pain Location: headache Pain Descriptors / Indicators: Aching Pain Intervention(s): Monitored during session    Home Living Family/patient expects to be discharged to:: Private residence Living Arrangements: Other (Comment) (granddaughter) Available Help at Discharge: Family Type of Home: House Home Access: Stairs to enter Entrance Stairs-Rails: Can reach both Entrance Stairs-Number of Steps: 5   Home Layout: One level Home Equipment: Cane - quad;Cane - single point;Shower seat Additional Comments: lives with granddaughter, son comes by to check in everyday lives 5 min away.    Prior Function Prior Level of Function : Independent/Modified Independent             Mobility Comments: quad cane inside home, straight cane in community. Most recent fall last year due to LOB. ADLs Comments: independent with bathing/dressing/wash dishes/laundry. Son takes to appointments. Pt does not cook.     Hand Dominance        Extremity/Trunk Assessment   Upper Extremity Assessment Upper Extremity Assessment: Defer to OT evaluation    Lower Extremity Assessment Lower Extremity Assessment: Generalized weakness       Communication   Communication: No difficulties  Cognition Arousal/Alertness: Awake/alert Behavior During Therapy: WFL for tasks assessed/performed Overall Cognitive Status: Within Functional Limits for tasks assessed                                 General Comments: pt very pleasant and cooperative        General Comments      Exercises     Assessment/Plan    PT Assessment Patient needs continued PT services  PT Problem  List Decreased strength;Decreased range of motion;Decreased activity tolerance;Decreased balance;Decreased mobility;Pain       PT Treatment Interventions DME instruction;Gait training;Stair training;Functional mobility training;Therapeutic activities;Therapeutic exercise;Balance training;Patient/family education    PT Goals (Current goals can be found in the Care Plan section)  Acute Rehab PT Goals Patient Stated Goal: Go home when able PT Goal Formulation: With patient Time For Goal Achievement: 03/15/23 Potential to Achieve Goals: Good    Frequency Min 3X/week     Co-evaluation               AM-PAC PT "6 Clicks" Mobility  Outcome Measure Help needed turning from your back to your side while in a flat bed without using bedrails?: None Help needed moving from lying on your back to sitting on the side of a flat bed without using bedrails?: A Little Help needed moving to and from a bed to a chair (including a wheelchair)?: A Little Help needed standing up from a chair using your arms (e.g., wheelchair or bedside chair)?: A Little Help needed to walk in hospital room?: A Little Help needed climbing 3-5 steps with a railing? : A Little 6 Click Score: 19    End of Session Equipment Utilized During  Treatment: Gait belt Activity Tolerance: Patient tolerated treatment well Patient left: in chair;with call bell/phone within reach;with chair alarm set Nurse Communication: Mobility status PT Visit Diagnosis: Unsteadiness on feet (R26.81);Muscle weakness (generalized) (M62.81);Pain Pain - Right/Left: Left Pain - part of body: Shoulder    Time: [id number] PT Time Calculation (min) (ACUTE ONLY): 27 min   Charges:   PT Evaluation $PT Eval Low Complexity: 1 Low PT Treatments $Therapeutic Activity: 8-22 mins        Festus Barren PT, DPT  Acute Rehabilitation Services  Office [phone]  03/01/2023, 12:22 PM

## 2023-03-01 NOTE — Progress Notes (Signed)
Echocardiogram 2D Echocardiogram has been performed.  Frances Furbish 03/01/2023, 9:25 AM

## 2023-03-01 NOTE — Consult Note (Addendum)
Cardiology Consultation   Patient ID: Breanna [name] MRN: [id number]; DOB: [birth year]  Admit date: 02/28/2023 Date of Consult: 03/01/2023  PCP:  Cassandria Anger, MD   [facility] Cardiologist: New (Dr. Gasper Sells)  Patient Profile:   Breanna [name] is a 89 y.o. female with a history of coronary artery calcifications noted on prior chest CT in 2018, hypertension, hyperlipidemia, type 2 diabetes, CKD stage III, CVA and 2010, GERD, low back pain, and blindness who is being seen 03/01/2023 for the evaluation of left shoulder pain and elevated troponin at the request of Dr. Tana Coast.  History of Present Illness:   Breanna [name] is a 89 year old female with the above history.  She has no known prior cardiac history. Coronary artery calcifications were noted on a prior chest CT in 2018. However, she has never had any cardiac workup.  Patient presented to the ED on 02/28/2023 for further evaluation of left shoulder pain that radiates down her left arm.  On arrival to the ED, patient hypertensive with BP as high as the 170s/80s but stable. EKG showed sinus bradycardia, rate 56 bpm, with no acute ST/T changes.  High-sensitivity troponin minimally elevated and flat at 45 >> 51 >> 45.  Chest x-ray showed linear subsegmental atelectasis in the left upper and lower lobes. WBC 6.0, Hgb 10.2, Plts 127. Na 140, K 4.0, BUN 30, Cr 1.23.  Patient was admitted under the internal medicine service and cardiology consulted for further evaluation.    X-ray of shoulder today showed mild superior subluxation of the humeral head relative to the glenoid which can be seen with rotator cuff pathology but no acute fracture or dislocation.  Echo showed EF of 55-60% with no regional wall motion abnormalities and grade 2 diastolic dysfunction.  At time of evaluation, patient is resting comfortably no acute distress.  She states she got a COVID and HPV immunization about 6 weeks ago and since then has been having  left shoulder pain.  She reports difficulty lifting her left arm that has gotten progressively worse.  She she also reports radiation down to her left hand and fingers and has tenderness to palpation over her left thumb.  She denies any cervical neck issues.  She reports some intermittent "nagging" discomfort in epigastric area that improves with gas relief medications.  However, no real chest pain.  She has some chronic dyspnea on exertion but states this has been getting a little worse over the past month.  She also reports some exertional fatigue over the last month.  No orthopnea, PND, or edema.  She denies any palpitations, lightheadedness, dizziness, syncope.  She does have some unsteadiness of her feet especially when she lifts her arms up but no recent falls.  She ambulates with a 4-prong cane.  She denies any recent fevers or illnesses.  No cough or nasal congestion.  No nausea, vomiting, diarrhea.  He is blind so she is not sure whether she had any abnormal bleeding in urine or stools.  Past Medical History:  Diagnosis Date   Adrenal adenoma 2006   Boils 2009   Cholelithiasis    asympt. w/normal HIDA 03/2010   CVA (cerebral infarction) 2010   Cerebellar   Diverticulitis    Esophageal spasm 2011   GERD (gastroesophageal reflux disease)    Gout    History of colon polyps    HTN (hypertension)    Hydronephrosis    LEFT/ Surgical intervention   Hyperlipidemia    LBP (low  back pain)    Osteoarthritis    Pulmonary HTN ([place])    Stress    Type II or unspecified type diabetes mellitus without mention of complication, not stated as uncontrolled     Past Surgical History:  Procedure Laterality Date   ABDOMINAL HYSTERECTOMY     APPENDECTOMY     2020   BACK SURGERY     BREAST BIOPSY     RIGHT   CATARACT EXTRACTION, BILATERAL     COLECTOMY  2006   Sigmoid   FOOT SURGERY     BILATERAL   HAMMER TOE SURGERY     INTRAMEDULLARY (IM) NAIL INTERTROCHANTERIC Right 07/21/2020   Procedure:  INTRAMEDULLARY (IM) NAIL INTERTROCHANTRIC;  Surgeon: Renette Butters, MD;  Location: [facility];  Service: Orthopedics;  Laterality: Right;   ROTATOR CUFF REPAIR  2008   RIGHT   TOTAL KNEE ARTHROPLASTY  2003   LEFT   VARICOSE VEIN SURGERY     vein stripping/lower extremities     Home Medications:  Prior to Admission medications   Medication Sig Start Date End Date Taking? Authorizing Provider  acetaminophen (TYLENOL) 325 MG tablet Take 2 tablets (650 mg total) by mouth every 6 (six) hours as needed for moderate pain or fever. 07/29/20   Nolberto Hanlon, MD  Alcohol Swabs (TRUE COMFORT ALCOHOL PREP PADS) 70 % PADS Use as directed 04/26/21   Plotnikov, Evie Lacks, MD  allopurinol (ZYLOPRIM) 100 MG tablet Take 1 tablet (100 mg total) by mouth daily. 04/11/22   Plotnikov, Evie Lacks, MD  amLODipine (NORVASC) 5 MG tablet Take 1 tablet by mouth daily.    [provider]  ascorbic acid (VITAMIN C) 500 MG tablet TAKE 1 TABLET BY MOUTH EVERY DAY 02/05/22   Plotnikov, Evie Lacks, MD  ASPIRIN LOW DOSE 81 MG tablet TAKE 1 TABLET BY MOUTH DAILY. SWALLOW WHOLE. 10/01/22   Plotnikov, Evie Lacks, MD  atropine 1 % ophthalmic solution atropine 1 % eye drops 01/16/21   [provider]  benzonatate (TESSALON) 200 MG capsule     [provider]  Blood Glucose Calibration (TRUE METRIX LEVEL 1) Low SOLN USE AS DIRECTED 04/20/21   Plotnikov, Evie Lacks, MD  brimonidine (ALPHAGAN) 0.2 % ophthalmic solution Place 1 drop into the right eye 2 times daily.    [provider]  calcium carbonate (OS-CAL) 600 MG tablet Take 1 tablet (600 mg total) by mouth daily. 03/07/21   Plotnikov, Evie Lacks, MD  calcium carbonate (OSCAL) 1500 (600 Ca) MG TABS tablet TAKE 1 TABLET BY MOUTH EVERY DAY 03/05/22   Plotnikov, Evie Lacks, MD  carvedilol (COREG) 25 MG tablet Take 1 tablet (25 mg total) by mouth 2 (two) times daily with a meal. 08/24/20   Plotnikov, Evie Lacks, MD  cephALEXin (KEFLEX) 500 MG capsule Take 1 capsule by  mouth 2 (two) times daily.    [provider]  Cholecalciferol (VITAMIN D3) 25 MCG (1000 UT) CAPS Take 1 capsule (1,000 Units total) by mouth daily. 04/13/22   Plotnikov, Evie Lacks, MD  diclofenac sodium (VOLTAREN) 1 % GEL Apply 2 g topically 2 (two) times daily. 07/25/17   Plotnikov, Evie Lacks, MD  docusate sodium (COLACE) 100 MG capsule TAKE 1 CAPSULE BY MOUTH TWICE A DAY 07/27/22   Plotnikov, Evie Lacks, MD  dorzolamide-timolol (COSOPT) 22.3-6.8 MG/ML ophthalmic solution Place 1 drop into both eyes 2 (two) times daily.     [provider]  feeding supplement, ENSURE ENLIVE, (ENSURE ENLIVE) LIQD Take 237  mLs by mouth daily at 2 PM. 07/30/20   Nolberto Hanlon, MD  feeding supplement, GLUCERNA SHAKE, (GLUCERNA SHAKE) LIQD Take 237 mLs by mouth 2 (two) times daily between meals. 07/29/20   Nolberto Hanlon, MD  gabapentin (NEURONTIN) 300 MG capsule Gabapentin 300 mg - take one with breakfast, one with supper and one at 3 am if woke up with pain 01/23/23   Plotnikov, Evie Lacks, MD  glucose blood (GNP TRUE METRIX GLUCOSE STRIPS) test strip Use to check blood sugars twice a day 04/26/21   Plotnikov, Evie Lacks, MD  latanoprost (XALATAN) 0.005 % ophthalmic solution Place 1 drop into both eyes at bedtime.  04/26/16   [provider]  lidocaine-prilocaine (EMLA) cream Apply 1 application topically as needed. 06/15/21   Landis Martins, DPM  losartan (COZAAR) 25 MG tablet Take 2 tablets by mouth daily.    [provider]  losartan (COZAAR) 50 MG tablet TAKE 1 TABLET BY MOUTH EVERY DAY 06/04/22   Plotnikov, Evie Lacks, MD  metFORMIN (GLUCOPHAGE) 500 MG tablet Take 1 tablet (500 mg total) by mouth daily with breakfast. Must keep scheduled appt for future refills 08/30/22   Plotnikov, Evie Lacks, MD  NON FORMULARY Place 10 drops under the tongue daily as needed (Pain). CBD oil    [provider]  pantoprazole (PROTONIX) 40 MG tablet TAKE 1 TABLET BY MOUTH EVERY DAY 12/16/22   Plotnikov, Evie Lacks, MD  polyethylene glycol (MIRALAX / GLYCOLAX) 17 g packet Take 17 g by mouth daily as needed for mild constipation. 07/29/20   Nolberto Hanlon, MD  senna (SENOKOT) 8.6 MG TABS tablet Take 1 tablet (8.6 mg total) by mouth 2 (two) times daily. 07/29/20   Nolberto Hanlon, MD  sertraline (ZOLOFT) 100 MG tablet TAKE 1 TABLET BY MOUTH EVERY DAY 11/12/22   Plotnikov, Evie Lacks, MD  spironolactone (ALDACTONE) 25 MG tablet TAKE 1 TAB BY MOUTH MON - WED - FRI 11/12/22   Plotnikov, Evie Lacks, MD  TRUEplus Lancets 33G MISC USE AS DIRECTED TO CHECK BLOOD SUGAR TWICE A DAY 07/23/22   Plotnikov, Evie Lacks, MD  valACYclovir (VALTREX) 500 MG tablet Take 1 tablet (500 mg total) by mouth 2 (two) times daily. 08/22/21   Plotnikov, Evie Lacks, MD  vitamin B-12 (CYANOCOBALAMIN) 1000 MCG tablet TAKE 1 TABLET (1,000 MCG TOTAL) BY MOUTH EVERY DAY 05/04/22   Plotnikov, Evie Lacks, MD    Inpatient Medications: Scheduled Meds:  amLODipine  5 mg Oral Daily   aspirin EC  81 mg Oral Daily   brimonidine  1 drop Right Eye Q1200   docusate sodium  100 mg Oral BID   dorzolamide-timolol  1 drop Both Eyes BID   gabapentin  300 mg Oral BID   insulin aspart  0-9 Units Subcutaneous TID WC   latanoprost  1 drop Both Eyes QHS   [START ON 03/02/2023] losartan  100 mg Oral Daily   losartan  50 mg Oral Once   pantoprazole  40 mg Oral Daily   sertraline  100 mg Oral Daily   spironolactone  25 mg Oral Once per day on Mon Wed Fri   Continuous Infusions:  PRN Meds: acetaminophen **OR** acetaminophen, fentaNYL (SUBLIMAZE) injection, hydrALAZINE, melatonin, naLOXone (NARCAN)  injection, ondansetron (ZOFRAN) IV, polyethylene glycol  Allergies:    Allergies  Allergen Reactions   Verapamil Shortness Of Breath    REACTION: SOB   Aspirin Itching    But tolerates low dose   Atenolol     REACTION:  fatigue   Codeine Itching   Codeine Sulfate    Hydrochlorothiazide     REACTION: gout   Hydrocodone       side effects - hallucinations    Ibuprofen     Upset stomach w/high doses   Lisinopril     REACTION: cough   Onion Other (See Comments)    Dry mouth/ gets sores   Shellfish Allergy Swelling    Patient stated she does not eat shellfish, she swells up on different parts of the body   Valsartan Itching   Adhesive  [Tape] Rash   Other Rash    Social History:   Social History   Socioeconomic History   Marital status: Widowed    Spouse name: Riniyah Brenneke   Number of children: 1   Years of education: Not on file   Highest education level: Not on file  Occupational History   Occupation: Retired    Fish farm manager: RETIRED  Tobacco Use   Smoking status: Never   Smokeless tobacco: Never  Vaping Use   Vaping Use: Never used  Substance and Sexual Activity   Alcohol use: No    Alcohol/week: 0.0 standard drinks of alcohol   Drug use: No   Sexual activity: Not Currently  Other Topics Concern   Not on file  Social History Narrative   Not on file   Social Determinants of Health   Financial Resource Strain: Low Risk  (11/05/2022)   Overall Financial Resource Strain (CARDIA)    Difficulty of Paying Living Expenses: Not hard at all  Food Insecurity: No Food Insecurity (02/28/2023)   Hunger Vital Sign    Worried About Running Out of Food in the Last Year: Never true    [place] in the Last Year: Never true  Transportation Needs: No Transportation Needs (02/28/2023)   PRAPARE - Hydrologist (Medical): No    Lack of Transportation (Non-Medical): No  Physical Activity: Inactive (11/05/2022)   Exercise Vital Sign    Days of Exercise per Week: 0 days    Minutes of Exercise per Session: 0 min  Stress: No Stress Concern Present (11/05/2022)   [place]    Feeling of Stress : Not at all  Social Connections: Moderately Isolated (11/05/2022)   Social Connection and Isolation Panel [NHANES]    Frequency of Communication with  Friends and Family: Twice a week    Frequency of Social Gatherings with Friends and Family: Twice a week    Attends Religious Services: 1 to 4 times per year    Active Member of [employer] or Organizations: No    Attends Archivist Meetings: Never    Marital Status: Widowed  Intimate Partner Violence: Not At Risk (02/28/2023)   Humiliation, Afraid, Rape, and Kick questionnaire    Fear of Current or Ex-Partner: No    Emotionally Abused: No    Physically Abused: No    Sexually Abused: No    Family History:   Family History  Problem Relation Age of Onset   Prostate cancer Father    Hypertension Father    Cancer Father        prostate   Heart disease Mother    Diabetes Mother    Diabetes Other        1st degree relative   Heart disease Other    Hypertension Other    Prostate cancer Maternal Uncle    Cancer Maternal  Uncle        prostate   Breast cancer Daughter    Cancer Daughter        breast   Colon cancer Neg Hx      ROS:  Please see the history of present illness.  Review of Systems  Constitutional:  Positive for malaise/fatigue. Negative for fever.  HENT:  Negative for congestion.   Eyes:        Blindness  Respiratory:  Positive for shortness of breath. Negative for cough.   Cardiovascular:  Negative for chest pain, palpitations, orthopnea, leg swelling and PND.  Gastrointestinal:  Negative for nausea and vomiting.  Musculoskeletal:  Positive for joint pain (shoulder pain).  Neurological:  Negative for dizziness and loss of consciousness.  Endo/Heme/Allergies:  Does not bruise/bleed easily.  Psychiatric/Behavioral:  Negative for substance abuse.     Physical Exam/Data:   Vitals:   02/28/23 2233 03/01/23 0245 03/01/23 0609 03/01/23 1059  BP: (!) 164/78 (!) 167/73 (!) 147/70 (!) 175/80  Pulse: (!) 51 (!) 51 (!) 47 (!) 50  Resp: '20 17 16 17  '$ Temp: 98.3 F (36.8 C) 98.4 F (36.9 C) 98.3 F (36.8 C) 97.9 F (36.6 C)  TempSrc:   Oral Oral  SpO2: 100% 96%  99% 99%  Weight:   51.6 kg   Height:       No intake or output data in the 24 hours ending 03/01/23 1452    03/01/2023    6:09 AM 02/28/2023    1:00 PM 02/25/2023    4:07 PM  Last 3 Weights  Weight (lbs) 113 lb 12.1 oz 116 lb 116 lb  Weight (kg) 51.6 kg 52.617 kg 52.617 kg     Body mass index is 20.81 kg/m.  General: 89 y.o. African-American female resting comfortably in no acute distress. HEENT: Normocephalic and atraumatic. Sclera clear.  Neck: Supple. No carotid bruits. No JVD. Heart: Bradycardic with normal rhythm. Distinct S1 and S2. No murmurs, gallops, or rubs. Radial and distal pedal pulses 2+ and equal bilaterally. Lungs: No increased work of breathing. Clear to ausculation bilaterally. No wheezes, rhonchi, or rales.  Abdomen: Soft, non-distended, and non-tender to palpation.  MSK: Normal strength and tone for age. Extremities: No clubbing, cyanosis, or edema.    Skin: Warm and dry. Neuro: Alert and oriented x3. No focal deficits. Psych: Normal affect. Responds appropriately.  EKG:  The EKG was personally reviewed and demonstrates:  Sinus bradycardia, rate 56 bpm, with no acute ST/T changes. Telemetry:  Telemetry was personally reviewed and demonstrates:  Sinus bradycardia with rates in the high 40s to 50s.  Relevant CV Studies:  Echocardiogram 03/01/2023: Impressions: 1. Left ventricular ejection fraction, by estimation, is 55 to 60%. The  left ventricle has normal function. The left ventricle has no regional  wall motion abnormalities. Left ventricular diastolic parameters are  consistent with Grade II diastolic  dysfunction (pseudonormalization).   2. Right ventricular systolic function is normal. The right ventricular  size is normal. There is mildly elevated pulmonary artery systolic  pressure. The estimated right ventricular systolic pressure is [id number] mmHg.   3. Left atrial size was moderately dilated.   4. The mitral valve is degenerative. Mild to moderate mitral  valve  regurgitation. No evidence of mitral stenosis.   5. Tricuspid valve regurgitation is moderate.   6. The aortic valve is tricuspid. There is moderate calcification of the  aortic valve. Aortic valve regurgitation is trivial. No aortic stenosis is  present.   7.  The inferior vena cava is dilated in size with >50% respiratory  variability, suggesting right atrial pressure of 8 mmHg.    Laboratory Data:  High Sensitivity Troponin:   Recent Labs  Lab 02/28/23 1540 02/28/23 1819 03/01/23 0550  TROPONINIHS 45* 51* 45*     Chemistry Recent Labs  Lab 02/28/23 1540 03/01/23 0550  NA 140 138  K 4.0 4.2  CL 106 106  CO2 25 25  GLUCOSE 118* 99  BUN 30* 26*  CREATININE 1.23* 0.97  CALCIUM 9.5 8.9  MG  --  2.0  GFRNONAA 42* 56*  ANIONGAP 9 7    Recent Labs  Lab 03/01/23 0550  PROT 6.4*  ALBUMIN 2.9*  AST 18  ALT 10  ALKPHOS 71  BILITOT 0.5   Lipids  Recent Labs  Lab 03/01/23 0641  CHOL 127  TRIG 42  HDL 53  LDLCALC 66  CHOLHDL 2.4    Hematology Recent Labs  Lab 02/28/23 1540 03/01/23 0550  WBC 6.0 4.2  RBC 3.59* 3.43*  HGB 10.2* 9.8*  HCT 31.6* 30.8*  MCV 88.0 89.8  MCH 28.4 28.6  MCHC 32.3 31.8  RDW 14.5 14.7  PLT 127* 116*   Thyroid No results for input(s): "TSH", "FREET4" in the last 168 hours.  BNPNo results for input(s): "BNP", "PROBNP" in the last 168 hours.  DDimer  Recent Labs  Lab 03/01/23 0641  DDIMER 2.13*     Radiology/Studies:  VAS [place] LOWER EXTREMITY VENOUS (DVT)  Result Date: 03/01/2023  Lower Venous DVT Study Patient Name:  Breanna [name]  Date of Exam:   03/01/2023 Medical Rec #: [id number]        Accession #:    [id number] Date of Birth: [birth year]         Patient Gender: F Patient Age:   89 years Exam Location:  [facility] Procedure:      VAS [place] LOWER EXTREMITY VENOUS (DVT) Referring Phys: RIPUDEEP RAI --------------------------------------------------------------------------------  Indications: Edema.  Risk Factors:  None identified. Limitations: Poor ultrasound/tissue interface. Comparison Study: No prior studies. Performing Technologist: Oliver Hum RVT  Examination Guidelines: A complete evaluation includes B-mode imaging, spectral Doppler, color Doppler, and power Doppler as needed of all accessible portions of each vessel. Bilateral testing is considered an integral part of a complete examination. Limited examinations for reoccurring indications may be performed as noted. The reflux portion of the exam is performed with the patient in reverse Trendelenburg.  +---------+---------------+---------+-----------+----------+--------------+ RIGHT    CompressibilityPhasicitySpontaneityPropertiesThrombus Aging +---------+---------------+---------+-----------+----------+--------------+ CFV      Full           Yes      Yes                                 +---------+---------------+---------+-----------+----------+--------------+ SFJ      Full                                                        +---------+---------------+---------+-----------+----------+--------------+ FV Prox  Full                                                        +---------+---------------+---------+-----------+----------+--------------+  FV Mid   Full                                                        +---------+---------------+---------+-----------+----------+--------------+ FV DistalFull                                                        +---------+---------------+---------+-----------+----------+--------------+ PFV      Full                                                        +---------+---------------+---------+-----------+----------+--------------+ POP      Full           Yes      Yes                                 +---------+---------------+---------+-----------+----------+--------------+ PTV      Full                                                         +---------+---------------+---------+-----------+----------+--------------+ PERO     Full                                                        +---------+---------------+---------+-----------+----------+--------------+   +---------+---------------+---------+-----------+----------+--------------+ LEFT     CompressibilityPhasicitySpontaneityPropertiesThrombus Aging +---------+---------------+---------+-----------+----------+--------------+ CFV      Full           Yes      Yes                                 +---------+---------------+---------+-----------+----------+--------------+ SFJ      Full                                                        +---------+---------------+---------+-----------+----------+--------------+ FV Prox  Full                                                        +---------+---------------+---------+-----------+----------+--------------+ FV Mid   Full                                                        +---------+---------------+---------+-----------+----------+--------------+  FV DistalFull                                                        +---------+---------------+---------+-----------+----------+--------------+ PFV      Full                                                        +---------+---------------+---------+-----------+----------+--------------+ POP      Full           Yes      Yes                                 +---------+---------------+---------+-----------+----------+--------------+ PTV      Full                                                        +---------+---------------+---------+-----------+----------+--------------+ PERO     Full                                                        +---------+---------------+---------+-----------+----------+--------------+    Summary: RIGHT: - There is no evidence of deep vein thrombosis in the lower extremity.  - No cystic structure found in  the popliteal fossa.  LEFT: - There is no evidence of deep vein thrombosis in the lower extremity.  - No cystic structure found in the popliteal fossa.  *See table(s) above for measurements and observations.    Preliminary    ECHOCARDIOGRAM COMPLETE  Result Date: 03/01/2023    ECHOCARDIOGRAM REPORT   Patient Name:   Breanna [name] Date of Exam: 03/01/2023 Medical Rec #:  [id number]       Height:       62.0 in Accession #:    [id number]      Weight:       113.8 lb Date of Birth:  04-04-1931        BSA:          1.504 m Patient Age:    87 years        BP:           147/70 mmHg Patient Gender: F               HR:           51 bpm. Exam Location:  Inpatient Procedure: 2D Echo, Cardiac Doppler and Color Doppler Indications:    elevated troponin  History:        Patient has no prior history of Echocardiogram examinations.                 Stroke; Risk Factors:Hypertension and Dyslipidemia.  Sonographer:    Phineas Douglas Referring Phys: [id number] Barrera  1. Left ventricular ejection fraction, by estimation, is 55 to 60%. The  left ventricle has normal function. The left ventricle has no regional wall motion abnormalities. Left ventricular diastolic parameters are consistent with Grade II diastolic dysfunction (pseudonormalization).  2. Right ventricular systolic function is normal. The right ventricular size is normal. There is mildly elevated pulmonary artery systolic pressure. The estimated right ventricular systolic pressure is [id number] mmHg.  3. Left atrial size was moderately dilated.  4. The mitral valve is degenerative. Mild to moderate mitral valve regurgitation. No evidence of mitral stenosis.  5. Tricuspid valve regurgitation is moderate.  6. The aortic valve is tricuspid. There is moderate calcification of the aortic valve. Aortic valve regurgitation is trivial. No aortic stenosis is present.  7. The inferior vena cava is dilated in size with >50% respiratory variability, suggesting right atrial  pressure of 8 mmHg. FINDINGS  Left Ventricle: Left ventricular ejection fraction, by estimation, is 55 to 60%. The left ventricle has normal function. The left ventricle has no regional wall motion abnormalities. The left ventricular internal cavity size was normal in size. There is  no left ventricular hypertrophy. Left ventricular diastolic parameters are consistent with Grade II diastolic dysfunction (pseudonormalization). Right Ventricle: The right ventricular size is normal. No increase in right ventricular wall thickness. Right ventricular systolic function is normal. There is mildly elevated pulmonary artery systolic pressure. The tricuspid regurgitant velocity is 3.03  m/s, and with an assumed right atrial pressure of 3 mmHg, the estimated right ventricular systolic pressure is [id number] mmHg. Left Atrium: Left atrial size was moderately dilated. Right Atrium: Right atrial size was normal in size. Pericardium: There is no evidence of pericardial effusion. Mitral Valve: The mitral valve is degenerative in appearance. Mild to moderate mitral valve regurgitation, with posteriorly-directed jet. No evidence of mitral valve stenosis. Tricuspid Valve: The tricuspid valve is normal in structure. Tricuspid valve regurgitation is moderate . No evidence of tricuspid stenosis. Aortic Valve: The aortic valve is tricuspid. There is moderate calcification of the aortic valve. Aortic valve regurgitation is trivial. Aortic regurgitation PHT measures 763 msec. No aortic stenosis is present. Pulmonic Valve: The pulmonic valve was not well visualized. Pulmonic valve regurgitation is trivial. No evidence of pulmonic stenosis. Aorta: The aortic root is normal in size and structure. Venous: The inferior vena cava is dilated in size with greater than 50% respiratory variability, suggesting right atrial pressure of 8 mmHg. IAS/Shunts: No atrial level shunt detected by color flow Doppler.  LEFT VENTRICLE PLAX 2D LVIDd:         4.20 cm       Diastology LVIDs:         2.90 cm      LV e' medial:    4.05 cm/s LV PW:         1.30 cm      LV E/e' medial:  18.1 LV IVS:        1.40 cm      LV e' lateral:   5.43 cm/s LVOT diam:     1.90 cm      LV E/e' lateral: 13.5 LV SV:         64 LV SV Index:   42 LVOT Area:     2.84 cm  LV Volumes (MOD) LV vol d, MOD A2C: 128.0 ml LV vol d, MOD A4C: 110.0 ml LV vol s, MOD A2C: 51.8 ml LV vol s, MOD A4C: 38.1 ml LV SV MOD A2C:     76.2 ml LV SV MOD A4C:     110.0 ml LV SV  MOD BP:      76.8 ml RIGHT VENTRICLE             IVC RV Basal diam:  3.90 cm     IVC diam: 2.10 cm RV S prime:     11.10 cm/s TAPSE (M-mode): 1.7 cm LEFT ATRIUM             Index        RIGHT ATRIUM           Index LA diam:        3.80 cm 2.53 cm/m   RA Area:     12.70 cm LA Vol (A2C):   62.6 ml 41.62 ml/m  RA Volume:   26.30 ml  17.49 ml/m LA Vol (A4C):   81.0 ml 53.85 ml/m LA Biplane Vol: 75.6 ml 50.26 ml/m  AORTIC VALVE LVOT Vmax:   96.00 cm/s LVOT Vmean:  60.100 cm/s LVOT VTI:    0.225 m AI PHT:      763 msec  AORTA Ao Root diam: 3.00 cm Ao Asc diam:  2.70 cm MITRAL VALVE                  TRICUSPID VALVE MV Area (PHT): 3.03 cm       TR Peak grad:   36.7 mmHg MV Decel Time: 250 msec       TR Vmax:        303.00 cm/s MR Peak grad:    159.3 mmHg MR Mean grad:    99.0 mmHg    SHUNTS MR Vmax:         631.00 cm/s  Systemic VTI:  0.22 m MR Vmean:        469.0 cm/s   Systemic Diam: 1.90 cm MR PISA:         1.57 cm MR PISA Eff ROA: 8 mm MR PISA Radius:  0.50 cm MV E velocity: 73.20 cm/s MV A velocity: 51.90 cm/s MV E/A ratio:  1.41 Glori Bickers MD Electronically signed by Glori Bickers MD Signature Date/Time: 03/01/2023/9:39:08 AM    Final    DG Shoulder Left  Result Date: 03/01/2023 CLINICAL DATA:  Left shoulder pain.  No known injury EXAM: LEFT SHOULDER - 2 VIEW COMPARISON:  None Available. FINDINGS: There is no evidence of fracture or dislocation. Mild superior subluxation of the humeral head relative to the glenoid. Degenerative changes  of the acromioclavicular joint. Soft tissues are unremarkable. IMPRESSION: 1. Mild superior subluxation of the humeral head relative to the glenoid, which can be seen with rotator cuff pathology. Degenerative changes of the acromioclavicular joint. 2.  No acute fracture or dislocation. Electronically Signed   By: Darrin Nipper M.D.   On: 03/01/2023 08:17   DG Chest 2 View  Result Date: 02/28/2023 CLINICAL DATA:  Left shoulder pain. EXAM: CHEST - 2 VIEW COMPARISON:  Radiograph 08/24/2022 FINDINGS: Stable heart size allowing for AP technique. Unchanged mediastinal contours. Linear subsegmental opacity in the left upper and lower lobes typical of atelectasis. No pneumothorax or pleural effusion. Normal pulmonary vasculature. Thoracic spondylosis with anterior spurring. There is mild acromioclavicular degenerative change of both shoulders. IMPRESSION: Linear subsegmental atelectasis in the left upper and lower lobes. Electronically Signed   By: Keith Rake M.D.   On: 02/28/2023 15:10     Assessment and Plan:   Left Shoulder Pain Elevated Troponin Coronary Artery Calcifications Patient presented with left shoulder pain.  EKG shows no acute ischemic changes.  High-sensitivity troponin minimally elevated and  flat at 45 >> 51 >> 45 not consistent with ACS. Coronary artery calcifications were seen on prior CT scan in 2018. Echo showed normal LV function with no regional wall motion abnormalities.  Shoulder x-ray showed mild superior subluxation of the humeral head relative to the glenoid which can be seen with rotator cuff pathology.  - She reports should pain and difficult lifting her arm for the pat 6 weeks.  - No chest pain. However, she does report worsening dyspnea on exertional and exertional fatigue over the last month. - D-dimer came back elevated this morning so chest CTA has been ordered by primary team.  - Shoulder pain not cardiac in nature. It sounds musculoskeletal (most likely a rotator cuff  injury). Troponin elevation is consistent with demand ischemia possibly due to uncontrolled hypertension. No inpatient ischemic work-up is necessary but could consider outpatient ischemic evaluation with a cardiac PET or coronary CTA. Will review with MD. - Otherwise, management of shoulder pain per primary team.   Hypertension BP elevated and as high as the 170s/80s. - Continue Amlodipine '5mg'$  daily. - Will increase Losartan to '100mg'$  daily. Will give another dose of '50mg'$  now and then can start '100mg'$  once daily tomorrow.  - On Coreg '25mg'$  twice daily at home but this was held on admission due to bradycardia. Heart rates as low as the high 40s to low 50s so will continue to hold for now. May be able to restart at a lower dose prior to discharge. - Also on Spironolactone '25mg'$  on Monday, Wednesday, Friday. Unclear why she is not on this daily. Will restart this. Will continue current home regimen for now given she is a thin frail 89 year old and I don't want to over-diuresis her.   CKD Stage III Creatinine 1.23 on admission. Baseline around 0.9 to 1.3.  - Improved to 0.97 today.   Otherwise, per primary team: - Left shoulder pain - GERD - Anemia - Type 2 diabetes mellitus   Risk Assessment/Risk Scores:    For questions or updates, please contact [place] Please consult [email or web address] for contact info under    Signed, Darreld Mclean, PA-C  03/01/2023 2:52 PM   Personally seen and examined. Agree with APP above with the following comments:  Briefly 89 yo blind F CKD stage IIIA and CAC with shoulder pain.  Found to have primary shoulder pathology.  As part of work up, found to have minimally elevated troponin. Peak < 55. She notes no chest pain. To my APP teammate, she noted SOB and fatigue. To me she notes that she has been feeling well.  She still cooks.  She rarely has fried chicken or french fries but most of her diet is fish and vegetables.    Patient is blind.  Exam  notable for decreased ROM in shoulder  no murmur.  Bradycardia off of BB  Would recommend  - do not suspect active ischemia; she has noted DOE to other provider, can discuss stress testing as outpatient - will increase ARB dose today; tomorrow given her moderate MR and TR may need diuretic start  - patient and family would like to follow up with [facility]; we will arrange follow up. - do not plan to restart Coreg given resting bradycardia ; if BP still elevated may increase norvasc to 10 mg  Rudean Haskell, MD [place]  [place] [place], [place] [address] [phone]  3:12 PM

## 2023-03-01 NOTE — Progress Notes (Signed)
[facility]                                                                              Martorell, is a 89 y.o. female, DOB - [birth year], [id number] Admit date - 02/28/2023    Outpatient Primary MD for the patient is Plotnikov, Evie Lacks, MD  LOS - 0  days  Chief Complaint  Patient presents with   Shoulder Pain    left       Brief summary   Patient is a 89 year old female with HTN, diabetes mellitus type 2, CKD 3B, anemia of chronic disease, presented with left shoulder pain.  Patient reported 3 to 4 months of recurrent sharp nonradiating left shoulder discomfort, worsens with certain movements.  Denies any preceding trauma to the left shoulder.  She was recently seen for this complaint and was recommended physical therapy however has not been able to establish with PT. Also reported pain under her left breast, intermittent, worse with movement and left arm.  No prior history of CAD.  She has history of right-sided rotator cuff tear prompting repair of right rotator cuff in 2008.  No focal weakness, paresthesias, no chest pain, shortness of breath.  No recent cardiac workup.    Assessment & Plan    Principal Problem: atypical chest pain, elevated troponin -Risk factors including diabetes mellitus, HTN, CKD.  EKG showed no acute ischemic abnormalities, troponin 45-> 51-> 45 -Chest x-ray showed subsegmental atelectasis in the left upper and lower lobes, no infiltrates -2D echo showed EF of 55 to 60%, G2 DD, no regional WMA -D-dimer 2.13, obtain CTA chest to rule out PE, venous Dopplers LE   Active problems Left shoulder pain -Left shoulder x-ray showed mild superior subluxation of the humeral head relative to glenoid can be seen with rotator cuff pathology, no fracture or dislocation.  Patient has a history of right rotator cuff injury status post repair for the right shoulder. -Will need physical therapy, left shoulder sling,  -outpatient follow-up with  orthopedics     DM2 (diabetes mellitus, type 2) ([place]) -Follow hemoglobin A1c -Continue sliding scale insulin while inpatient CBG (last 3)  Recent Labs    03/01/23 0738 03/01/23 1156  GLUCAP 91 85      Essential hypertension -BP currently controlled, continue losartan, resume amlodipine -Heart rate on the lower side 47-51, hold beta-blocker -Add hydralazine IV as needed with parameters    GERD (gastroesophageal reflux disease) -Continue Protonix    History of anemia due to chronic kidney disease -H&H stable, close to baseline, 10-11    Stage 3b chronic kidney disease (CKD) ([facility]) -Creatinine stable, at baseline.,  1-1.3  Estimated body mass index is 20.81 kg/m as calculated from the following:   Height as of this encounter: '5\' 2"'$  (1.575 m).   Weight as of this encounter: 51.6 kg.  Code Status: Full code DVT Prophylaxis:  SCDs Start: 02/28/23 2258   Level of Care: Level of care: Telemetry Family Communication: Updated patient Disposition Plan:      Remains inpatient appropriate:   Workup in progress   Procedures:  2D echo  Consultants:   Appears that  cardiology has been consulted on admission  Antimicrobials: none   Medications  aspirin EC  81 mg Oral Daily   brimonidine  1 drop Right Eye Q1200   docusate sodium  100 mg Oral BID   dorzolamide-timolol  1 drop Both Eyes BID   gabapentin  300 mg Oral BID   insulin aspart  0-9 Units Subcutaneous TID WC   latanoprost  1 drop Both Eyes QHS   losartan  50 mg Oral Daily   pantoprazole  40 mg Oral Daily   sertraline  100 mg Oral Daily      Subjective:   Breanna [name] was seen and examined today.  C/o left shoulder pain which is more of issue.  Currently not complaining of any chest pain or shortness of breath, abdominal pain.  Has prior history of right rotator cuff injury.  BP elevated  Objective:   Vitals:   02/28/23 2233 03/01/23 0245 03/01/23 0609 03/01/23 1059  BP: (!) 164/78 (!) 167/73 (!) 147/70 (!)  175/80  Pulse: (!) 51 (!) 51 (!) 47 (!) 50  Resp: '20 17 16 17  '$ Temp: 98.3 F (36.8 C) 98.4 F (36.9 C) 98.3 F (36.8 C) 97.9 F (36.6 C)  TempSrc:   Oral Oral  SpO2: 100% 96% 99% 99%  Weight:   51.6 kg   Height:       No intake or output data in the 24 hours ending 03/01/23 1147   Wt Readings from Last 3 Encounters:  03/01/23 51.6 kg  02/25/23 52.6 kg  02/13/23 52.6 kg     Exam General: Alert and oriented x 3, NAD Cardiovascular: S1 S2 auscultated,  RRR Respiratory: Clear to auscultation bilaterally, no chest wall tenderness. Gastrointestinal: Soft, nontender, nondistended, + bowel sounds Ext: no pedal edema bilaterally, ROM decreased on abduction and external rotation left shoulder. Neuro: Strength 5/5 upper and lower extremities bilaterally Skin: No rashes Psych: Normal affect     Data Reviewed:  I have personally reviewed following labs    CBC Lab Results  Component Value Date   WBC 4.2 03/01/2023   RBC 3.43 (L) 03/01/2023   HGB 9.8 (L) 03/01/2023   HCT 30.8 (L) 03/01/2023   MCV 89.8 03/01/2023   MCH 28.6 03/01/2023   PLT 116 (L) 03/01/2023   MCHC 31.8 03/01/2023   RDW 14.7 03/01/2023   LYMPHSABS 0.8 03/01/2023   MONOABS 0.5 03/01/2023   EOSABS 0.2 03/01/2023   BASOSABS 0.0 [id number]     Last metabolic panel Lab Results  Component Value Date   NA 138 03/01/2023   K 4.2 03/01/2023   CL 106 03/01/2023   CO2 25 03/01/2023   BUN 26 (H) 03/01/2023   CREATININE 0.97 03/01/2023   GLUCOSE 99 03/01/2023   GFRNONAA 56 (L) 03/01/2023   GFRAA 41 (L) 08/18/2020   CALCIUM 8.9 03/01/2023   PROT 6.4 (L) 03/01/2023   ALBUMIN 2.9 (L) 03/01/2023   LABGLOB 4.1 (H) 05/15/2018   BILITOT 0.5 03/01/2023   ALKPHOS 71 03/01/2023   AST 18 03/01/2023   ALT 10 03/01/2023   ANIONGAP 7 03/01/2023    CBG (last 3)  Recent Labs    03/01/23 0738  GLUCAP 91      Coagulation Profile: No results for input(s): "INR", "PROTIME" in the last 168  hours.   Radiology Studies: I have personally reviewed the imaging studies  ECHOCARDIOGRAM COMPLETE  Result Date: 03/01/2023    ECHOCARDIOGRAM REPORT   Patient Name:   Breanna [name] Date  of Exam: 03/01/2023 Medical Rec #:  [id number]       Height:       62.0 in Accession #:    [id number]      Weight:       113.8 lb Date of Birth:  02-25-1931        BSA:          1.504 m Patient Age:    22 years        BP:           147/70 mmHg Patient Gender: F               HR:           51 bpm. Exam Location:  Inpatient Procedure: 2D Echo, Cardiac Doppler and Color Doppler Indications:    elevated troponin  History:        Patient has no prior history of Echocardiogram examinations.                 Stroke; Risk Factors:Hypertension and Dyslipidemia.  Sonographer:    Phineas Douglas Referring Phys: [id number] [place]  1. Left ventricular ejection fraction, by estimation, is 55 to 60%. The left ventricle has normal function. The left ventricle has no regional wall motion abnormalities. Left ventricular diastolic parameters are consistent with Grade II diastolic dysfunction (pseudonormalization).  2. Right ventricular systolic function is normal. The right ventricular size is normal. There is mildly elevated pulmonary artery systolic pressure. The estimated right ventricular systolic pressure is [id number] mmHg.  3. Left atrial size was moderately dilated.  4. The mitral valve is degenerative. Mild to moderate mitral valve regurgitation. No evidence of mitral stenosis.  5. Tricuspid valve regurgitation is moderate.  6. The aortic valve is tricuspid. There is moderate calcification of the aortic valve. Aortic valve regurgitation is trivial. No aortic stenosis is present.  7. The inferior vena cava is dilated in size with >50% respiratory variability, suggesting right atrial pressure of 8 mmHg. FINDINGS  Left Ventricle: Left ventricular ejection fraction, by estimation, is 55 to 60%. The left ventricle has normal  function. The left ventricle has no regional wall motion abnormalities. The left ventricular internal cavity size was normal in size. There is  no left ventricular hypertrophy. Left ventricular diastolic parameters are consistent with Grade II diastolic dysfunction (pseudonormalization). Right Ventricle: The right ventricular size is normal. No increase in right ventricular wall thickness. Right ventricular systolic function is normal. There is mildly elevated pulmonary artery systolic pressure. The tricuspid regurgitant velocity is 3.03  m/s, and with an assumed right atrial pressure of 3 mmHg, the estimated right ventricular systolic pressure is [id number] mmHg. Left Atrium: Left atrial size was moderately dilated. Right Atrium: Right atrial size was normal in size. Pericardium: There is no evidence of pericardial effusion. Mitral Valve: The mitral valve is degenerative in appearance. Mild to moderate mitral valve regurgitation, with posteriorly-directed jet. No evidence of mitral valve stenosis. Tricuspid Valve: The tricuspid valve is normal in structure. Tricuspid valve regurgitation is moderate . No evidence of tricuspid stenosis. Aortic Valve: The aortic valve is tricuspid. There is moderate calcification of the aortic valve. Aortic valve regurgitation is trivial. Aortic regurgitation PHT measures 763 msec. No aortic stenosis is present. Pulmonic Valve: The pulmonic valve was not well visualized. Pulmonic valve regurgitation is trivial. No evidence of pulmonic stenosis. Aorta: The aortic root is normal in size and structure. Venous: The inferior vena cava is dilated in size with greater than 50%  respiratory variability, suggesting right atrial pressure of 8 mmHg. IAS/Shunts: No atrial level shunt detected by color flow Doppler.  LEFT VENTRICLE PLAX 2D LVIDd:         4.20 cm      Diastology LVIDs:         2.90 cm      LV e' medial:    4.05 cm/s LV PW:         1.30 cm      LV E/e' medial:  18.1 LV IVS:        1.40 cm       LV e' lateral:   5.43 cm/s LVOT diam:     1.90 cm      LV E/e' lateral: 13.5 LV SV:         64 LV SV Index:   42 LVOT Area:     2.84 cm  LV Volumes (MOD) LV vol d, MOD A2C: 128.0 ml LV vol d, MOD A4C: 110.0 ml LV vol s, MOD A2C: 51.8 ml LV vol s, MOD A4C: 38.1 ml LV SV MOD A2C:     76.2 ml LV SV MOD A4C:     110.0 ml LV SV MOD BP:      76.8 ml RIGHT VENTRICLE             IVC RV Basal diam:  3.90 cm     IVC diam: 2.10 cm RV S prime:     11.10 cm/s TAPSE (M-mode): 1.7 cm LEFT ATRIUM             Index        RIGHT ATRIUM           Index LA diam:        3.80 cm 2.53 cm/m   RA Area:     12.70 cm LA Vol (A2C):   62.6 ml 41.62 ml/m  RA Volume:   26.30 ml  17.49 ml/m LA Vol (A4C):   81.0 ml 53.85 ml/m LA Biplane Vol: 75.6 ml 50.26 ml/m  AORTIC VALVE LVOT Vmax:   96.00 cm/s LVOT Vmean:  60.100 cm/s LVOT VTI:    0.225 m AI PHT:      763 msec  AORTA Ao Root diam: 3.00 cm Ao Asc diam:  2.70 cm MITRAL VALVE                  TRICUSPID VALVE MV Area (PHT): 3.03 cm       TR Peak grad:   36.7 mmHg MV Decel Time: 250 msec       TR Vmax:        303.00 cm/s MR Peak grad:    159.3 mmHg MR Mean grad:    99.0 mmHg    SHUNTS MR Vmax:         631.00 cm/s  Systemic VTI:  0.22 m MR Vmean:        469.0 cm/s   Systemic Diam: 1.90 cm MR PISA:         1.57 cm MR PISA Eff ROA: 8 mm MR PISA Radius:  0.50 cm MV E velocity: 73.20 cm/s MV A velocity: 51.90 cm/s MV E/A ratio:  1.41 Glori Bickers MD Electronically signed by Glori Bickers MD Signature Date/Time: 03/01/2023/9:39:08 AM    Final    DG Shoulder Left  Result Date: 03/01/2023 CLINICAL DATA:  Left shoulder pain.  No known injury EXAM: LEFT SHOULDER - 2 VIEW COMPARISON:  None Available. FINDINGS: There is no  evidence of fracture or dislocation. Mild superior subluxation of the humeral head relative to the glenoid. Degenerative changes of the acromioclavicular joint. Soft tissues are unremarkable. IMPRESSION: 1. Mild superior subluxation of the humeral head relative to the  glenoid, which can be seen with rotator cuff pathology. Degenerative changes of the acromioclavicular joint. 2.  No acute fracture or dislocation. Electronically Signed   By: Darrin Nipper M.D.   On: 03/01/2023 08:17   DG Chest 2 View  Result Date: 02/28/2023 CLINICAL DATA:  Left shoulder pain. EXAM: CHEST - 2 VIEW COMPARISON:  Radiograph 08/24/2022 FINDINGS: Stable heart size allowing for AP technique. Unchanged mediastinal contours. Linear subsegmental opacity in the left upper and lower lobes typical of atelectasis. No pneumothorax or pleural effusion. Normal pulmonary vasculature. Thoracic spondylosis with anterior spurring. There is mild acromioclavicular degenerative change of both shoulders. IMPRESSION: Linear subsegmental atelectasis in the left upper and lower lobes. Electronically Signed   By: Keith Rake M.D.   On: 02/28/2023 15:10       Adraine Biffle M.D. [facility] 03/01/2023, 11:47 AM  Available via Epic secure chat 7am-7pm After 7 pm, please refer to night coverage provider listed on amion.

## 2023-03-01 NOTE — Evaluation (Signed)
Occupational Therapy Evaluation Patient Details Name: Breanna [name] MRN: [id number] DOB: 1931-05-18 Today's Date: 03/01/2023   History of Present Illness Pt is a 89 y.o. female presenting with atypical chest pain, elevated troponin and 3 to 4 months of recurrent sharp, nonradiating left shoulder discomfort that worsens with certain movements of the left upper extremity. Left shoulder x-ray showed mild superior subluxation of the humeral head relative to glenoid can be seen with rotator cuff pathology, no fracture or dislocation. PMH significant for HTN, diabetes mellitus type 2, CKD 3B, anemia, cerebellar CVA (2010), GERD, gout, L TKA (2003), and R RTC repair (2008) .   Clinical Impression   [name] is an 89 year old woman who presented to hospital with complaints of left shoulder pain and found to have elevated troponin. Cardiology has ruled out Shoulder pain from cardiologic issue. Imaging showed  Mild superior subluxation of the humeral head relative to the glenoid, which can be seen with rotator cuff pathology. Degenerative changes of the acromioclavicular joint. With testing patent exhibited decreased shoulder ROM and strength and pain and exhibits symptoms point to rotator cuff pathology. Therapist provided some positioning tips for patient and two exercises to perform. Family reports PT was supposed to start at home today to address shoulder pain. Will defer to [facility] PT for further shoulder management. From an OT standpoint she has no acute care OT needs.      Recommendations for follow up therapy are one component of a multi-disciplinary discharge planning process, led by the attending physician.  Recommendations may be updated based on patient status, additional functional criteria and insurance authorization.   Follow Up Recommendations  No OT follow up     Assistance Recommended at Discharge Intermittent Supervision/Assistance  Patient can return home with the following Assistance  with cooking/housework;Direct supervision/assist for medications management;Direct supervision/assist for financial management;Help with stairs or ramp for entrance    Functional Status Assessment  Patient has not had a recent decline in their functional status  Equipment Recommendations  None recommended by OT    Recommendations for Other Services       Precautions / Restrictions Precautions Precautions: Fall Precaution Comments: legally blind Restrictions Weight Bearing Restrictions: No      Mobility Bed Mobility Overal bed mobility: Modified Independent             General bed mobility comments: No physical assist for bed mobility    Transfers                          Balance Overall balance assessment: Mild deficits observed, not formally tested                                         ADL either performed or assessed with clinical judgement   ADL Overall ADL's : At baseline                                             Vision Baseline Vision/History: 2 Legally blind Ability to See in Adequate Light: 3 Highly impaired       Perception     Praxis      Pertinent Vitals/Pain Pain Assessment Pain Assessment: Faces Faces Pain Scale: Hurts little more Pain Location:  L shoulder with AROM Pain Descriptors / Indicators: Grimacing Pain Intervention(s): Monitored during session     Hand Dominance Right   Extremity/Trunk Assessment Upper Extremity Assessment Upper Extremity Assessment: RUE deficits/detail;LUE deficits/detail RUE Deficits / Details: WFL ROM and grossly 5/5 strength RUE Sensation: WNL RUE Coordination: WNL LUE Deficits / Details: Decreased shoulder ROM, pain with active flexion and abduction to approx 45-60 degrees. Has normal internal and external shoudler ROM without pain with resistance. Positive pain and weakness with empty can test. LUE Sensation: WNL LUE Coordination: WNL   Lower  Extremity Assessment Lower Extremity Assessment: Defer to PT evaluation   Cervical / Trunk Assessment Cervical / Trunk Assessment: Normal   Communication Communication Communication: No difficulties   Cognition Arousal/Alertness: Awake/alert Behavior During Therapy: WFL for tasks assessed/performed Overall Cognitive Status: Within Functional Limits for tasks assessed                                       General Comments       Exercises     Shoulder Instructions      Home Living Family/patient expects to be discharged to:: Private residence Living Arrangements: Other (Comment) (granddaughter) Available Help at Discharge: Family Type of Home: House Home Access: Stairs to enter Technical brewer of Steps: 5 Entrance Stairs-Rails: Can reach both Home Layout: One level     Bathroom Shower/Tub: Walk-in shower (threshold)   Bathroom Toilet: Handicapped height Bathroom Accessibility: Yes   Home Equipment: Cane - quad;Cane - single point;Shower seat   Additional Comments: lives with granddaughter, son comes by to check in everyday lives 5 min away.      Prior Functioning/Environment Prior Level of Function : Independent/Modified Independent             Mobility Comments: quad cane inside home, straight cane in community. Most recent fall last year due to LOB. ADLs Comments: independent with bathing/dressing/wash dishes/laundry. Son takes to appointments. Pt does not cook.        OT Problem List: Decreased strength;Decreased range of motion;Pain;Impaired UE functional use      OT Treatment/Interventions:      OT Goals(Current goals can be found in the care plan section) Acute Rehab OT Goals OT Goal Formulation: All assessment and education complete, DC therapy  OT Frequency:      Co-evaluation              AM-PAC OT "6 Clicks" Daily Activity     Outcome Measure Help from another person eating meals?: None Help from another person  taking care of personal grooming?: None Help from another person toileting, which includes using toliet, bedpan, or urinal?: None Help from another person bathing (including washing, rinsing, drying)?: None Help from another person to put on and taking off regular upper body clothing?: None Help from another person to put on and taking off regular lower body clothing?: None 6 Click Score: 24   End of Session    Activity Tolerance: Patient tolerated treatment well Patient left: in bed;with call bell/phone within reach;with family/visitor present  OT Visit Diagnosis: Pain                Time: [id number] OT Time Calculation (min): 16 min Charges:  OT General Charges $OT Visit: 1 Visit OT Evaluation $OT Eval Low Complexity: 1 Low  Gustavo Lah, OTR/L [place]  Office [phone]   Lenward Chancellor 03/01/2023,  4:08 PM

## 2023-03-02 ENCOUNTER — Observation Stay (HOSPITAL_COMMUNITY): Payer: Medicare PPO

## 2023-03-02 DIAGNOSIS — N1832 Chronic kidney disease, stage 3b: Secondary | ICD-10-CM | POA: Diagnosis not present

## 2023-03-02 DIAGNOSIS — R079 Chest pain, unspecified: Secondary | ICD-10-CM | POA: Diagnosis not present

## 2023-03-02 DIAGNOSIS — Z862 Personal history of diseases of the blood and blood-forming organs and certain disorders involving the immune mechanism: Secondary | ICD-10-CM | POA: Diagnosis not present

## 2023-03-02 DIAGNOSIS — R7989 Other specified abnormal findings of blood chemistry: Secondary | ICD-10-CM | POA: Diagnosis not present

## 2023-03-02 DIAGNOSIS — E1121 Type 2 diabetes mellitus with diabetic nephropathy: Secondary | ICD-10-CM | POA: Diagnosis not present

## 2023-03-02 DIAGNOSIS — K219 Gastro-esophageal reflux disease without esophagitis: Secondary | ICD-10-CM | POA: Diagnosis not present

## 2023-03-02 DIAGNOSIS — N189 Chronic kidney disease, unspecified: Secondary | ICD-10-CM | POA: Diagnosis not present

## 2023-03-02 DIAGNOSIS — M67814 Other specified disorders of tendon, left shoulder: Secondary | ICD-10-CM | POA: Diagnosis not present

## 2023-03-02 DIAGNOSIS — M25512 Pain in left shoulder: Secondary | ICD-10-CM | POA: Diagnosis not present

## 2023-03-02 LAB — CBC
HCT: 31 % — ABNORMAL LOW (ref 36.0–46.0)
Hemoglobin: 9.9 g/dL — ABNORMAL LOW (ref 12.0–15.0)
MCH: 28.3 pg (ref 26.0–34.0)
MCHC: 31.9 g/dL (ref 30.0–36.0)
MCV: 88.6 fL (ref 80.0–100.0)
Platelets: 121 10*3/uL — ABNORMAL LOW (ref 150–400)
RBC: 3.5 MIL/uL — ABNORMAL LOW (ref 3.87–5.11)
RDW: 14.6 % (ref 11.5–15.5)
WBC: 4.1 10*3/uL (ref 4.0–10.5)
nRBC: 0 % (ref 0.0–0.2)

## 2023-03-02 LAB — GLUCOSE, CAPILLARY
Glucose-Capillary: 90 mg/dL (ref 70–99)
Glucose-Capillary: 84 mg/dL (ref 70–99)
Glucose-Capillary: 124 mg/dL — ABNORMAL HIGH (ref 70–99)
Glucose-Capillary: 110 mg/dL — ABNORMAL HIGH (ref 70–99)

## 2023-03-02 LAB — BASIC METABOLIC PANEL
Anion gap: 6 (ref 5–15)
BUN: 27 mg/dL — ABNORMAL HIGH (ref 8–23)
CO2: 24 mmol/L (ref 22–32)
Calcium: 8.5 mg/dL — ABNORMAL LOW (ref 8.9–10.3)
Chloride: 103 mmol/L (ref 98–111)
Creatinine, Ser: 1.26 mg/dL — ABNORMAL HIGH (ref 0.44–1.00)
GFR, Estimated: 41 mL/min — ABNORMAL LOW (ref >60)
Glucose, Bld: 98 mg/dL (ref 70–99)
Potassium: 4 mmol/L (ref 3.5–5.1)
Sodium: 133 mmol/L — ABNORMAL LOW (ref 135–145)

## 2023-03-02 NOTE — Plan of Care (Signed)
Verbal education provided 

## 2023-03-02 NOTE — Plan of Care (Signed)

## 2023-03-02 NOTE — Plan of Care (Signed)
Problem: Education: Goal: Knowledge of General Education information will improve Description: Including pain rating scale, medication(s)/side effects and non-pharmacologic comfort measures 03/02/2023 0031 by Deveron Furlong, RN Outcome: Progressing 03/02/2023 0031 by Deveron Furlong, RN Outcome: Progressing   Problem: Health Behavior/Discharge Planning: Goal: Ability to manage health-related needs will improve 03/02/2023 0031 by Deveron Furlong, RN Outcome: Progressing 03/02/2023 0031 by Deveron Furlong, RN Outcome: Progressing   Problem: Clinical Measurements: Goal: Ability to maintain clinical measurements within normal limits will improve 03/02/2023 0031 by Deveron Furlong, RN Outcome: Progressing 03/02/2023 0031 by Deveron Furlong, RN Outcome: Progressing Goal: Will remain free from infection 03/02/2023 0031 by Deveron Furlong, RN Outcome: Progressing 03/02/2023 0031 by Deveron Furlong, RN Outcome: Progressing Goal: Diagnostic test results will improve 03/02/2023 0031 by Deveron Furlong, RN Outcome: Progressing 03/02/2023 0031 by Deveron Furlong, RN Outcome: Progressing Goal: Respiratory complications will improve 03/02/2023 0031 by Deveron Furlong, RN Outcome: Progressing 03/02/2023 0031 by Deveron Furlong, RN Outcome: Progressing Goal: Cardiovascular complication will be avoided 03/02/2023 0031 by Deveron Furlong, RN Outcome: Progressing 03/02/2023 0031 by Deveron Furlong, RN Outcome: Progressing   Problem: Activity: Goal: Risk for activity intolerance will decrease 03/02/2023 0031 by Deveron Furlong, RN Outcome: Progressing 03/02/2023 0031 by Deveron Furlong, RN Outcome: Progressing   Problem: Nutrition: Goal: Adequate nutrition will be maintained 03/02/2023 0031 by Deveron Furlong, RN Outcome: Progressing 03/02/2023 0031 by Deveron Furlong, RN Outcome: Progressing   Problem: Coping: Goal: Level of anxiety will decrease 03/02/2023 0031 by Deveron Furlong, RN Outcome:  Progressing 03/02/2023 0031 by Deveron Furlong, RN Outcome: Progressing   Problem: Elimination: Goal: Will not experience complications related to bowel motility 03/02/2023 0031 by Deveron Furlong, RN Outcome: Progressing 03/02/2023 0031 by Deveron Furlong, RN Outcome: Progressing Goal: Will not experience complications related to urinary retention 03/02/2023 0031 by Deveron Furlong, RN Outcome: Progressing 03/02/2023 0031 by Deveron Furlong, RN Outcome: Progressing   Problem: Pain Managment: Goal: General experience of comfort will improve 03/02/2023 0031 by Deveron Furlong, RN Outcome: Progressing 03/02/2023 0031 by Deveron Furlong, RN Outcome: Progressing   Problem: Safety: Goal: Ability to remain free from injury will improve 03/02/2023 0031 by Deveron Furlong, RN Outcome: Progressing 03/02/2023 0031 by Deveron Furlong, RN Outcome: Progressing   Problem: Skin Integrity: Goal: Risk for impaired skin integrity will decrease 03/02/2023 0031 by Deveron Furlong, RN Outcome: Progressing 03/02/2023 0031 by Deveron Furlong, RN Outcome: Progressing   Problem: Education: Goal: Ability to describe self-care measures that may prevent or decrease complications (Diabetes Survival Skills Education) will improve 03/02/2023 0031 by Deveron Furlong, RN Outcome: Progressing 03/02/2023 0031 by Deveron Furlong, RN Outcome: Progressing Goal: Individualized Educational Video(s) 03/02/2023 0031 by Deveron Furlong, RN Outcome: Progressing 03/02/2023 0031 by Deveron Furlong, RN Outcome: Progressing   Problem: Coping: Goal: Ability to adjust to condition or change in health will improve 03/02/2023 0031 by Deveron Furlong, RN Outcome: Progressing 03/02/2023 0031 by Deveron Furlong, RN Outcome: Progressing   Problem: Fluid Volume: Goal: Ability to maintain a balanced intake and output will improve 03/02/2023 0031 by Deveron Furlong, RN Outcome: Progressing 03/02/2023 0031 by Deveron Furlong, RN Outcome: Progressing    Problem: Health Behavior/Discharge Planning: Goal: Ability to identify and utilize available resources and services will improve 03/02/2023 0031 by Deveron Furlong, RN Outcome: Progressing 03/02/2023 0031 by Deveron Furlong,  RN Outcome: Progressing Goal: Ability to manage health-related needs will improve 03/02/2023 0031 by Deveron Furlong, RN Outcome: Progressing 03/02/2023 0031 by Deveron Furlong, RN Outcome: Progressing   Problem: Metabolic: Goal: Ability to maintain appropriate glucose levels will improve 03/02/2023 0031 by Deveron Furlong, RN Outcome: Progressing 03/02/2023 0031 by Deveron Furlong, RN Outcome: Progressing   Problem: Nutritional: Goal: Maintenance of adequate nutrition will improve 03/02/2023 0031 by Deveron Furlong, RN Outcome: Progressing 03/02/2023 0031 by Deveron Furlong, RN Outcome: Progressing Goal: Progress toward achieving an optimal weight will improve 03/02/2023 0031 by Deveron Furlong, RN Outcome: Progressing 03/02/2023 0031 by Deveron Furlong, RN Outcome: Progressing   Problem: Skin Integrity: Goal: Risk for impaired skin integrity will decrease 03/02/2023 0031 by Deveron Furlong, RN Outcome: Progressing 03/02/2023 0031 by Deveron Furlong, RN Outcome: Progressing   Problem: Tissue Perfusion: Goal: Adequacy of tissue perfusion will improve 03/02/2023 0031 by Deveron Furlong, RN Outcome: Progressing 03/02/2023 0031 by Deveron Furlong, RN Outcome: Progressing

## 2023-03-02 NOTE — Progress Notes (Signed)
[facility]                                                                              Breanna [name], is a 89 y.o. female, DOB - [birth year], [id number] Admit date - 02/28/2023    Outpatient Primary MD for the patient is Plotnikov, Evie Lacks, MD  LOS - 0  days  Chief Complaint  Patient presents with   Shoulder Pain    left       Brief summary   Patient is a 89 year old female with HTN, diabetes mellitus type 2, CKD 3B, anemia of chronic disease, presented with left shoulder pain.  Patient reported 3 to 4 months of recurrent sharp nonradiating left shoulder discomfort, worsens with certain movements.  Denies any preceding trauma to the left shoulder.  She was recently seen for this complaint and was recommended physical therapy however has not been able to establish with PT. Also reported pain under her left breast, intermittent, worse with movement and left arm.  No prior history of CAD.  She has history of right-sided rotator cuff tear prompting repair of right rotator cuff in 2008.  No focal weakness, paresthesias, no chest pain, shortness of breath.  No recent cardiac workup.    Assessment & Plan    Principal Problem: atypical chest pain, elevated troponin -Risk factors including diabetes mellitus, HTN, CKD.  EKG showed no acute ischemic abnormalities, troponin 45-> 51-> 45 -Chest x-ray showed subsegmental atelectasis in the left upper and lower lobes, no infiltrates -2D echo showed EF of 55 to 60%, G2 DD, no regional WMA -D-dimer 2.13, CTA negative for acute PE, cardiomegaly.  Doppler ultrasound lower extremities negative for DVT -Cardiology recommendations reviewed, do not suspect active ischemia, recommended outpatient stress test, outpatient follow-up with [facility]   Active problems Left shoulder pain likely rotator cuff injury -Left shoulder x-ray showed mild superior subluxation of the humeral head relative to glenoid can be seen with rotator cuff pathology,  no fracture or dislocation.  Patient has a history of right rotator cuff injury status post repair for the right shoulder. -Will need physical therapy, left shoulder sling,  -Discussed with patient and the son at the bedside, requested MRI of the left shoulder and they will follow-up with orthopedics outpatient    DM2 (diabetes mellitus, type 2) ([place]) -Hemoglobin 1C6.2 on 02/13/2023 -Continue sliding scale insulin while inpatient CBG (last 3)  Recent Labs    03/01/23 2056 03/02/23 0802 03/02/23 1206  GLUCAP 102* 90 84      Essential hypertension -BP controlled, continue losartan, amlodipine, spironolactone  -Heart rate on the lower side 47-51, hold beta-blocker -Continue hydralazine IV as needed with parameters    GERD (gastroesophageal reflux disease) -Continue Protonix    History of anemia due to chronic kidney disease -H&H stable, close to baseline, 10-11    Stage 3b chronic kidney disease (CKD) (HCC) -Creatinine stable, at baseline.,  1-1.3 -CTA chest incidental showed 9 mm hypodense lesion in the speakerphone of left kidney, may represent hyperdense cyst, indeterminate, can be evaluated with ultrasound.  Recommend outpatient workup if needed   Estimated body mass index is 20.81 kg/m as  calculated from the following:   Height as of this encounter: '5\' 2"'$  (1.575 m).   Weight as of this encounter: 51.6 kg.  Code Status: Full code DVT Prophylaxis:  SCDs Start: 02/28/23 2258   Level of Care: Level of care: Telemetry Family Communication: Updated patient's son at the bedside Disposition Plan:      Remains inpatient appropriate:   Workup in progress   Procedures:  2D echo  Consultants:   Cardiology  Antimicrobials: none   Medications  amLODipine  5 mg Oral Daily   aspirin EC  81 mg Oral Daily   brimonidine  1 drop Right Eye Q1200   docusate sodium  100 mg Oral BID   dorzolamide-timolol  1 drop Both Eyes BID   gabapentin  300 mg Oral BID   insulin aspart  0-9  Units Subcutaneous TID WC   latanoprost  1 drop Both Eyes QHS   losartan  100 mg Oral Daily   pantoprazole  40 mg Oral Daily   sertraline  100 mg Oral Daily   spironolactone  25 mg Oral Once per day on Mon Wed Fri      Subjective:   Breanna [name] was seen and examined today.  Denies any chest pain, dizziness or lightheadedness.  Continues to complain of left shoulder pain  Has prior history of right rotator cuff injury.  BP elevated, has bradycardia with heart rate in 40s.  Objective:   Vitals:   03/01/23 1503 03/01/23 1936 03/02/23 0519 03/02/23 1347  BP: (!) 153/74 118/60 (!) 147/81 (!) 152/67  Pulse: (!) 46 (!) 44 (!) 49 (!) 45  Resp: '16  16 16  '$ Temp: 98 F (36.7 C) 97.9 F (36.6 C) 98.4 F (36.9 C) 98.4 F (36.9 C)  TempSrc: Oral Oral Oral Oral  SpO2: 99% 99% 100% 99%  Weight:      Height:        Intake/Output Summary (Last 24 hours) at 03/02/2023 1524 Last data filed at 03/02/2023 0840 Gross per 24 hour  Intake 240 ml  Output --  Net 240 ml     Wt Readings from Last 3 Encounters:  03/01/23 51.6 kg  02/25/23 52.6 kg  02/13/23 52.6 kg    Physical Exam General: Alert and oriented x 3, NAD Cardiovascular: S1 S2 clear, RRR.  Respiratory: CTAB, no wheezing, rales or rhonchi Gastrointestinal: Soft, nontender, nondistended, NBS Ext: no pedal edema bilaterally, ROM decreased on abduction and external rotation left shoulder Neuro: no new deficits Psych: Normal affect and demeanor, alert and oriented x3    Data Reviewed:  I have personally reviewed following labs    CBC Lab Results  Component Value Date   WBC 4.1 03/02/2023   RBC 3.50 (L) 03/02/2023   HGB 9.9 (L) 03/02/2023   HCT 31.0 (L) 03/02/2023   MCV 88.6 03/02/2023   MCH 28.3 03/02/2023   PLT 121 (L) 03/02/2023   MCHC 31.9 03/02/2023   RDW 14.6 03/02/2023   LYMPHSABS 0.8 03/01/2023   MONOABS 0.5 03/01/2023   EOSABS 0.2 03/01/2023   BASOSABS 0.0 [id number]     Last metabolic panel Lab Results   Component Value Date   NA 133 (L) 03/02/2023   K 4.0 03/02/2023   CL 103 03/02/2023   CO2 24 03/02/2023   BUN 27 (H) 03/02/2023   CREATININE 1.26 (H) 03/02/2023   GLUCOSE 98 03/02/2023   GFRNONAA 41 (L) 03/02/2023   GFRAA 41 (L) 08/18/2020   CALCIUM 8.5 (L) 03/02/2023  PROT 6.4 (L) 03/01/2023   ALBUMIN 2.9 (L) 03/01/2023   LABGLOB 4.1 (H) 05/15/2018   BILITOT 0.5 03/01/2023   ALKPHOS 71 03/01/2023   AST 18 03/01/2023   ALT 10 03/01/2023   ANIONGAP 6 03/02/2023    CBG (last 3)  Recent Labs    03/01/23 2056 03/02/23 0802 03/02/23 1206  GLUCAP 102* 90 84      Coagulation Profile: No results for input(s): "INR", "PROTIME" in the last 168 hours.   Radiology Studies: I have personally reviewed the imaging studies  CT Angio Chest Pulmonary Embolism (PE) W or WO Contrast  Result Date: 03/01/2023 CLINICAL DATA:  Lungs are intermediate probability for PE. Positive D-dimer. EXAM: CT ANGIOGRAPHY CHEST WITH CONTRAST TECHNIQUE: Multidetector CT imaging of the chest was performed using the standard protocol during bolus administration of intravenous contrast. Multiplanar CT image reconstructions and MIPs were obtained to evaluate the vascular anatomy. RADIATION DOSE REDUCTION: This exam was performed according to the departmental dose-optimization program which includes automated exposure control, adjustment of the mA and/or kV according to patient size and/or use of iterative reconstruction technique. CONTRAST:  64m OMNIPAQUE IOHEXOL 350 MG/ML SOLN COMPARISON:  Chest CT 08/15/2017 FINDINGS: Cardiovascular: Heart is enlarged. Aortic is ectatic without focal dilatation. There is adequate opacification of the pulmonary arteries to the segmental level. There is no evidence for pulmonary embolism. There are atherosclerotic calcifications of the aorta. Mediastinum/Nodes: No enlarged mediastinal, hilar, or axillary lymph nodes. Esophagus and trachea are within normal limits. The thyroid gland is  diffusely heterogeneous. There are calcified mediastinal and right hilar lymph nodes. Lungs/Pleura: Lungs are clear. No pleural effusion or pneumothorax. Upper Abdomen: No acute abnormality. There are calcified granulomas in the liver and spleen. Rounded mildly hyperdense area in the superior pole of the left kidney is new from prior measuring 9 mm. Musculoskeletal: The bones are osteopenic. Degenerative changes affect the spine. Review of the MIP images confirms the above findings. IMPRESSION: 1. No evidence for pulmonary embolism or other acute cardiopulmonary process. 2. Cardiomegaly. 3. New 9 mm hyperdense lesion in the superior pole of the left kidney. This may represent a hyperdense cyst, but is indeterminate. This can be further evaluated with ultrasound. Aortic Atherosclerosis (ICD10-I70.0). Electronically Signed   By: ARonney AstersM.D.   On: 03/01/2023 21:40   VAS [place]OWER EXTREMITY VENOUS (DVT)  Result Date: 03/01/2023  Lower Venous DVT Study Patient Name:  LKONICA MCHARG Date of Exam:   03/01/2023 Medical Rec #: [id number]       Accession #:    [id number] of Birth: [birth year]        Patient Gender: F Patient Age:   89years [facility]:      VAS [place]OWER EXTREMITY VENOUS (DVT) Referring Phys: Clarabell Matsuoka --------------------------------------------------------------------------------  Indications: Edema.  Risk Factors: None identified. Limitations: Poor ultrasound/tissue interface. Comparison Study: No prior studies. Performing Technologist: GOliver HumRVT  Examination Guidelines: A complete evaluation includes B-mode imaging, spectral Doppler, color Doppler, and power Doppler as needed of all accessible portions of each vessel. Bilateral testing is considered an integral part of a complete examination. Limited examinations for reoccurring indications may be performed as noted. The reflux portion of the exam is performed with the patient in reverse  Trendelenburg.  +---------+---------------+---------+-----------+----------+--------------+ RIGHT    CompressibilityPhasicitySpontaneityPropertiesThrombus Aging +---------+---------------+---------+-----------+----------+--------------+ CFV      Full           Yes  Yes                                 +---------+---------------+---------+-----------+----------+--------------+ SFJ      Full                                                        +---------+---------------+---------+-----------+----------+--------------+ FV Prox  Full                                                        +---------+---------------+---------+-----------+----------+--------------+ FV Mid   Full                                                        +---------+---------------+---------+-----------+----------+--------------+ FV DistalFull                                                        +---------+---------------+---------+-----------+----------+--------------+ PFV      Full                                                        +---------+---------------+---------+-----------+----------+--------------+ POP      Full           Yes      Yes                                 +---------+---------------+---------+-----------+----------+--------------+ PTV      Full                                                        +---------+---------------+---------+-----------+----------+--------------+ PERO     Full                                                        +---------+---------------+---------+-----------+----------+--------------+   +---------+---------------+---------+-----------+----------+--------------+ LEFT     CompressibilityPhasicitySpontaneityPropertiesThrombus Aging +---------+---------------+---------+-----------+----------+--------------+ CFV      Full           Yes      Yes                                  +---------+---------------+---------+-----------+----------+--------------+ SFJ      Full                                                        +---------+---------------+---------+-----------+----------+--------------+  FV Prox  Full                                                        +---------+---------------+---------+-----------+----------+--------------+ FV Mid   Full                                                        +---------+---------------+---------+-----------+----------+--------------+ FV DistalFull                                                        +---------+---------------+---------+-----------+----------+--------------+ PFV      Full                                                        +---------+---------------+---------+-----------+----------+--------------+ POP      Full           Yes      Yes                                 +---------+---------------+---------+-----------+----------+--------------+ PTV      Full                                                        +---------+---------------+---------+-----------+----------+--------------+ PERO     Full                                                        +---------+---------------+---------+-----------+----------+--------------+    Summary: RIGHT: - There is no evidence of deep vein thrombosis in the lower extremity.  - No cystic structure found in the popliteal fossa.  LEFT: - There is no evidence of deep vein thrombosis in the lower extremity.  - No cystic structure found in the popliteal fossa.  *See table(s) above for measurements and observations.    Preliminary    ECHOCARDIOGRAM COMPLETE  Result Date: 03/01/2023    ECHOCARDIOGRAM REPORT   Patient Name:   KALEENA HUGO Date of Exam: 03/01/2023 Medical Rec #:  [id number]       Height:       62.0 in Accession #:    [id number]      Weight:       113.8 lb Date of Birth:  November 08, 1931        BSA:          1.504 m Patient  Age:    16 years        BP:  147/70 mmHg Patient Gender: F               HR:           51 bpm. Exam Location:  Inpatient Procedure: 2D Echo, Cardiac Doppler and Color Doppler Indications:    elevated troponin  History:        Patient has no prior history of Echocardiogram examinations.                 Stroke; Risk Factors:Hypertension and Dyslipidemia.  Sonographer:    Phineas Douglas Referring Phys: [id number] [place]  1. Left ventricular ejection fraction, by estimation, is 55 to 60%. The left ventricle has normal function. The left ventricle has no regional wall motion abnormalities. Left ventricular diastolic parameters are consistent with Grade II diastolic dysfunction (pseudonormalization).  2. Right ventricular systolic function is normal. The right ventricular size is normal. There is mildly elevated pulmonary artery systolic pressure. The estimated right ventricular systolic pressure is [id number] mmHg.  3. Left atrial size was moderately dilated.  4. The mitral valve is degenerative. Mild to moderate mitral valve regurgitation. No evidence of mitral stenosis.  5. Tricuspid valve regurgitation is moderate.  6. The aortic valve is tricuspid. There is moderate calcification of the aortic valve. Aortic valve regurgitation is trivial. No aortic stenosis is present.  7. The inferior vena cava is dilated in size with >50% respiratory variability, suggesting right atrial pressure of 8 mmHg. FINDINGS  Left Ventricle: Left ventricular ejection fraction, by estimation, is 55 to 60%. The left ventricle has normal function. The left ventricle has no regional wall motion abnormalities. The left ventricular internal cavity size was normal in size. There is  no left ventricular hypertrophy. Left ventricular diastolic parameters are consistent with Grade II diastolic dysfunction (pseudonormalization). Right Ventricle: The right ventricular size is normal. No increase in right ventricular wall thickness.  Right ventricular systolic function is normal. There is mildly elevated pulmonary artery systolic pressure. The tricuspid regurgitant velocity is 3.03  m/s, and with an assumed right atrial pressure of 3 mmHg, the estimated right ventricular systolic pressure is [id number] mmHg. Left Atrium: Left atrial size was moderately dilated. Right Atrium: Right atrial size was normal in size. Pericardium: There is no evidence of pericardial effusion. Mitral Valve: The mitral valve is degenerative in appearance. Mild to moderate mitral valve regurgitation, with posteriorly-directed jet. No evidence of mitral valve stenosis. Tricuspid Valve: The tricuspid valve is normal in structure. Tricuspid valve regurgitation is moderate . No evidence of tricuspid stenosis. Aortic Valve: The aortic valve is tricuspid. There is moderate calcification of the aortic valve. Aortic valve regurgitation is trivial. Aortic regurgitation PHT measures 763 msec. No aortic stenosis is present. Pulmonic Valve: The pulmonic valve was not well visualized. Pulmonic valve regurgitation is trivial. No evidence of pulmonic stenosis. Aorta: The aortic root is normal in size and structure. Venous: The inferior vena cava is dilated in size with greater than 50% respiratory variability, suggesting right atrial pressure of 8 mmHg. IAS/Shunts: No atrial level shunt detected by color flow Doppler.  LEFT VENTRICLE PLAX 2D LVIDd:         4.20 cm      Diastology LVIDs:         2.90 cm      LV e' medial:    4.05 cm/s LV PW:         1.30 cm      LV E/e' medial:  18.1 LV IVS:  1.40 cm      LV e' lateral:   5.43 cm/s LVOT diam:     1.90 cm      LV E/e' lateral: 13.5 LV SV:         64 LV SV Index:   42 LVOT Area:     2.84 cm  LV Volumes (MOD) LV vol d, MOD A2C: 128.0 ml LV vol d, MOD A4C: 110.0 ml LV vol s, MOD A2C: 51.8 ml LV vol s, MOD A4C: 38.1 ml LV SV MOD A2C:     76.2 ml LV SV MOD A4C:     110.0 ml LV SV MOD BP:      76.8 ml RIGHT VENTRICLE             IVC RV Basal  diam:  3.90 cm     IVC diam: 2.10 cm RV S prime:     11.10 cm/s TAPSE (M-mode): 1.7 cm LEFT ATRIUM             Index        RIGHT ATRIUM           Index LA diam:        3.80 cm 2.53 cm/m   RA Area:     12.70 cm LA Vol (A2C):   62.6 ml 41.62 ml/m  RA Volume:   26.30 ml  17.49 ml/m LA Vol (A4C):   81.0 ml 53.85 ml/m LA Biplane Vol: 75.6 ml 50.26 ml/m  AORTIC VALVE LVOT Vmax:   96.00 cm/s LVOT Vmean:  60.100 cm/s LVOT VTI:    0.225 m AI PHT:      763 msec  AORTA Ao Root diam: 3.00 cm Ao Asc diam:  2.70 cm MITRAL VALVE                  TRICUSPID VALVE MV Area (PHT): 3.03 cm       TR Peak grad:   36.7 mmHg MV Decel Time: 250 msec       TR Vmax:        303.00 cm/s MR Peak grad:    159.3 mmHg MR Mean grad:    99.0 mmHg    SHUNTS MR Vmax:         631.00 cm/s  Systemic VTI:  0.22 m MR Vmean:        469.0 cm/s   Systemic Diam: 1.90 cm MR PISA:         1.57 cm MR PISA Eff ROA: 8 mm MR PISA Radius:  0.50 cm MV E velocity: 73.20 cm/s MV A velocity: 51.90 cm/s MV E/A ratio:  1.41 Glori Bickers MD Electronically signed by Glori Bickers MD Signature Date/Time: 03/01/2023/9:39:08 AM    Final    DG Shoulder Left  Result Date: 03/01/2023 CLINICAL DATA:  Left shoulder pain.  No known injury EXAM: LEFT SHOULDER - 2 VIEW COMPARISON:  None Available. FINDINGS: There is no evidence of fracture or dislocation. Mild superior subluxation of the humeral head relative to the glenoid. Degenerative changes of the acromioclavicular joint. Soft tissues are unremarkable. IMPRESSION: 1. Mild superior subluxation of the humeral head relative to the glenoid, which can be seen with rotator cuff pathology. Degenerative changes of the acromioclavicular joint. 2.  No acute fracture or dislocation. Electronically Signed   By: Darrin Nipper M.D.   On: 03/01/2023 08:17       Isis Costanza M.D. [facility] 03/02/2023, 3:24 PM  Available via Epic secure chat 7am-7pm After  7 pm, please refer to night coverage provider listed on  amion.

## 2023-03-02 NOTE — Progress Notes (Signed)
Dr Shara Blazing consult note reviewed, no additional cardiology inpatient recs at this time. We will arrange outpatient f/u and sign off inpatient care   Carlyle Dolly MD

## 2023-03-03 DIAGNOSIS — Z862 Personal history of diseases of the blood and blood-forming organs and certain disorders involving the immune mechanism: Secondary | ICD-10-CM | POA: Diagnosis not present

## 2023-03-03 DIAGNOSIS — R7989 Other specified abnormal findings of blood chemistry: Secondary | ICD-10-CM | POA: Diagnosis not present

## 2023-03-03 DIAGNOSIS — E1121 Type 2 diabetes mellitus with diabetic nephropathy: Secondary | ICD-10-CM | POA: Diagnosis not present

## 2023-03-03 DIAGNOSIS — R079 Chest pain, unspecified: Secondary | ICD-10-CM | POA: Diagnosis not present

## 2023-03-03 DIAGNOSIS — N1832 Chronic kidney disease, stage 3b: Secondary | ICD-10-CM | POA: Diagnosis not present

## 2023-03-03 DIAGNOSIS — M25512 Pain in left shoulder: Secondary | ICD-10-CM | POA: Diagnosis not present

## 2023-03-03 DIAGNOSIS — N189 Chronic kidney disease, unspecified: Secondary | ICD-10-CM | POA: Diagnosis not present

## 2023-03-03 DIAGNOSIS — K219 Gastro-esophageal reflux disease without esophagitis: Secondary | ICD-10-CM | POA: Diagnosis not present

## 2023-03-03 LAB — GLUCOSE, CAPILLARY
Glucose-Capillary: 90 mg/dL (ref 70–99)
Glucose-Capillary: 88 mg/dL (ref 70–99)

## 2023-03-03 MED ORDER — TRAMADOL HCL 50 MG PO TABS: 50.0000 mg | ORAL_TABLET | Freq: Three times a day (TID) | ORAL | Status: DC | PRN

## 2023-03-03 MED ORDER — LOSARTAN POTASSIUM 100 MG PO TABS: 100.0000 mg | ORAL_TABLET | Freq: Every day | ORAL | 3 refills | Status: DC

## 2023-03-03 MED ORDER — DICLOFENAC SODIUM 1 % EX GEL
2.0000 g | Freq: Four times a day (QID) | CUTANEOUS | Status: DC
  Filled 2023-03-03: qty 100

## 2023-03-03 MED ORDER — VALACYCLOVIR HCL 500 MG PO TABS: 500.0000 mg | ORAL_TABLET | Freq: Every day | ORAL | Status: DC | PRN

## 2023-03-03 MED ORDER — DICLOFENAC SODIUM 1 % TD GEL: 2.0000 g | Freq: Three times a day (TID) | TRANSDERMAL | 3 refills | Status: DC

## 2023-03-03 MED ORDER — TRAMADOL HCL 50 MG PO TABS: 50.0000 mg | ORAL_TABLET | Freq: Three times a day (TID) | ORAL | 0 refills | Status: DC | PRN

## 2023-03-03 MED ORDER — LIDOCAINE 5 % EX PTCH
1.0000 | MEDICATED_PATCH | CUTANEOUS | Status: DC
  Administered 2023-03-03: 1 via TRANSDERMAL
  Filled 2023-03-03: qty 1

## 2023-03-03 MED ORDER — LIDOCAINE 5 % EX PTCH: 1.0000 | MEDICATED_PATCH | CUTANEOUS | 0 refills | Status: DC

## 2023-03-03 NOTE — Progress Notes (Signed)
Orthopedic Tech Progress Note Patient Details:  Breanna [name] 1931-02-22 [id number]  Ortho Devices Type of Ortho Device: Arm sling Ortho Device/Splint Location: LUE Ortho Device/Splint Interventions: Ordered, Application, Adjustment   Post Interventions Patient Tolerated: Well Instructions Provided: Adjustment of device, Care of device, Poper ambulation with device  Breanna [name] 03/03/2023, 10:01 AM

## 2023-03-03 NOTE — Consult Note (Signed)
ORTHOPAEDIC CONSULTATION  REQUESTING PHYSICIAN: Mendel Corning, MD  Chief Complaint: Left shoulder pain  HPI: Breanna [name] is a 89 y.o. female who presents with presents today with ongoing left shoulder pain for the last several days.  She states that she does have a baseline history of left shoulder pain which she believes is arthritis.  She presented to the emergency room for chest pain workup that was essentially negative.  She is here today as she is continue to have limited overhead activity and pain in the shoulder that radiates to the lateral aspect of the shoulder.  Past Medical History:  Diagnosis Date   Adrenal adenoma 2006   Boils 2009   Cholelithiasis    asympt. w/normal HIDA 03/2010   CVA (cerebral infarction) 2010   Cerebellar   Diverticulitis    Esophageal spasm 2011   GERD (gastroesophageal reflux disease)    Gout    History of colon polyps    HTN (hypertension)    Hydronephrosis    LEFT/ Surgical intervention   Hyperlipidemia    LBP (low back pain)    Osteoarthritis    Pulmonary HTN (HCC)    Stress    Type II or unspecified type diabetes mellitus without mention of complication, not stated as uncontrolled    Past Surgical History:  Procedure Laterality Date   ABDOMINAL HYSTERECTOMY     APPENDECTOMY     2020   BACK SURGERY     BREAST BIOPSY     RIGHT   CATARACT EXTRACTION, BILATERAL     COLECTOMY  2006   Sigmoid   FOOT SURGERY     BILATERAL   HAMMER TOE SURGERY     INTRAMEDULLARY (IM) NAIL INTERTROCHANTERIC Right 07/21/2020   Procedure: INTRAMEDULLARY (IM) NAIL INTERTROCHANTRIC;  Surgeon: Renette Butters, MD;  Location: [facility];  Service: Orthopedics;  Laterality: Right;   ROTATOR CUFF REPAIR  2008   RIGHT   TOTAL KNEE ARTHROPLASTY  2003   LEFT   VARICOSE VEIN SURGERY     vein stripping/lower extremities   Social History   Socioeconomic History   Marital status: Widowed    Spouse name: Cozette Wick   Number of children: 1   Years of  education: Not on file   Highest education level: Not on file  Occupational History   Occupation: Retired    Fish farm manager: RETIRED  Tobacco Use   Smoking status: Never   Smokeless tobacco: Never  Vaping Use   Vaping Use: Never used  Substance and Sexual Activity   Alcohol use: No    Alcohol/week: 0.0 standard drinks of alcohol   Drug use: No   Sexual activity: Not Currently  Other Topics Concern   Not on file  Social History Narrative   Not on file   Social Determinants of Health   Financial Resource Strain: Low Risk  (11/05/2022)   Overall Financial Resource Strain (CARDIA)    Difficulty of Paying Living Expenses: Not hard at all  Food Insecurity: No Food Insecurity (02/28/2023)   Hunger Vital Sign    Worried About Running Out of Food in the Last Year: Never true    [place] in the Last Year: Never true  Transportation Needs: No Transportation Needs (02/28/2023)   PRAPARE - Hydrologist (Medical): No    Lack of Transportation (Non-Medical): No  Physical Activity: Inactive (11/05/2022)   Exercise Vital Sign    Days of Exercise per Week: 0  days    Minutes of Exercise per Session: 0 min  Stress: No Stress Concern Present (11/05/2022)   [place]    Feeling of Stress : Not at all  Social Connections: Moderately Isolated (11/05/2022)   Social Connection and Isolation Panel [NHANES]    Frequency of Communication with Friends and Family: Twice a week    Frequency of Social Gatherings with Friends and Family: Twice a week    Attends Religious Services: 1 to 4 times per year    Active Member of [employer] or Organizations: No    Attends Archivist Meetings: Never    Marital Status: Widowed   Family History  Problem Relation Age of Onset   Prostate cancer Father    Hypertension Father    Cancer Father        prostate   Heart disease Mother    Diabetes Mother    Diabetes  Other        1st degree relative   Heart disease Other    Hypertension Other    Prostate cancer Maternal Uncle    Cancer Maternal Uncle        prostate   Breast cancer Daughter    Cancer Daughter        breast   Colon cancer Neg Hx    - negative except otherwise stated in the family history section Allergies  Allergen Reactions   Verapamil Shortness Of Breath    REACTION: SOB   Aspirin Itching    But tolerates low dose   Atenolol     REACTION: fatigue   Codeine Itching   Codeine Sulfate    Hydrochlorothiazide     REACTION: gout   Hydrocodone       side effects - hallucinations   Ibuprofen     Upset stomach w/high doses   Lisinopril     REACTION: cough   Onion Other (See Comments)    Dry mouth/ gets sores   Shellfish Allergy Swelling    Patient stated she does not eat shellfish, she swells up on different parts of the body   Valsartan Itching   Adhesive  [Tape] Rash   Other Rash   Prior to Admission medications   Medication Sig Start Date End Date Taking? Authorizing Provider  acetaminophen (TYLENOL) 325 MG tablet Take 2 tablets (650 mg total) by mouth every 6 (six) hours as needed for moderate pain or fever. 07/29/20  Yes Nolberto Hanlon, MD  allopurinol (ZYLOPRIM) 100 MG tablet Take 1 tablet (100 mg total) by mouth daily. 04/11/22  Yes Plotnikov, Evie Lacks, MD  amLODipine (NORVASC) 5 MG tablet Take 1 tablet by mouth daily.   Yes [provider]  ascorbic acid (VITAMIN C) 500 MG tablet TAKE 1 TABLET BY MOUTH EVERY DAY Patient taking differently: Take 500 mg by mouth daily. 02/05/22  Yes Plotnikov, Evie Lacks, MD  ASPIRIN LOW DOSE 81 MG tablet TAKE 1 TABLET BY MOUTH DAILY. SWALLOW WHOLE. Patient taking differently: Take 81 mg by mouth daily. 10/01/22  Yes Plotnikov, Evie Lacks, MD  atropine 1 % ophthalmic solution Place 1 drop into both eyes daily. 01/16/21  Yes [provider]  brimonidine (ALPHAGAN) 0.2 % ophthalmic solution Place 1 drop into the right eye 2  (two) times daily.   Yes [provider]  calcium carbonate (OSCAL) 1500 (600 Ca) MG TABS tablet TAKE 1 TABLET BY MOUTH EVERY DAY Patient taking differently: Take 1,500 mg by mouth  daily with breakfast. 03/05/22  Yes Plotnikov, Evie Lacks, MD  carvedilol (COREG) 25 MG tablet Take 1 tablet (25 mg total) by mouth 2 (two) times daily with a meal. 08/24/20  Yes Plotnikov, Evie Lacks, MD  Cholecalciferol (VITAMIN D3) 25 MCG (1000 UT) CAPS Take 1 capsule (1,000 Units total) by mouth daily. 04/13/22  Yes Plotnikov, Evie Lacks, MD  diclofenac sodium (VOLTAREN) 1 % GEL Apply 2 g topically 2 (two) times daily. Patient taking differently: Apply 2 g topically 2 (two) times daily as needed (For pain). 07/25/17  Yes Plotnikov, Evie Lacks, MD  docusate sodium (COLACE) 100 MG capsule TAKE 1 CAPSULE BY MOUTH TWICE A DAY Patient taking differently: Take 100 mg by mouth 2 (two) times daily. 07/27/22  Yes Plotnikov, Evie Lacks, MD  dorzolamide-timolol (COSOPT) 22.3-6.8 MG/ML ophthalmic solution Place 1 drop into both eyes 2 (two) times daily.    Yes [provider]  feeding supplement, ENSURE ENLIVE, (ENSURE ENLIVE) LIQD Take 237 mLs by mouth daily at 2 PM. 07/30/20  Yes Nolberto Hanlon, MD  gabapentin (NEURONTIN) 300 MG capsule Gabapentin 300 mg - take one with breakfast, one with supper and one at 3 am if woke up with pain Patient taking differently: Take 300 mg by mouth 3 (three) times daily. 01/23/23  Yes Plotnikov, Evie Lacks, MD  latanoprost (XALATAN) 0.005 % ophthalmic solution Place 1 drop into both eyes at bedtime.  04/26/16  Yes [provider]  lidocaine-prilocaine (EMLA) cream Apply 1 application topically as needed. Patient taking differently: Apply 1 application  topically daily as needed (For pain). 06/15/21  Yes Stover, Titorya, DPM  losartan (COZAAR) 50 MG tablet TAKE 1 TABLET BY MOUTH EVERY DAY Patient taking differently: Take 50 mg by mouth daily. 06/04/22  Yes Plotnikov, Evie Lacks, MD  metFORMIN  (GLUCOPHAGE) 500 MG tablet Take 1 tablet (500 mg total) by mouth daily with breakfast. Must keep scheduled appt for future refills 08/30/22  Yes Plotnikov, Evie Lacks, MD  NON FORMULARY Place 10 drops under the tongue daily as needed (Pain). Medication: CBD oil   Yes [provider]  polyethylene glycol (MIRALAX / GLYCOLAX) 17 g packet Take 17 g by mouth daily as needed for mild constipation. 07/29/20  Yes Nolberto Hanlon, MD  senna (SENOKOT) 8.6 MG TABS tablet Take 1 tablet (8.6 mg total) by mouth 2 (two) times daily. 07/29/20  Yes Nolberto Hanlon, MD  sertraline (ZOLOFT) 100 MG tablet TAKE 1 TABLET BY MOUTH EVERY DAY 11/12/22  Yes Plotnikov, Evie Lacks, MD  spironolactone (ALDACTONE) 25 MG tablet TAKE 1 TAB BY MOUTH MON - WED - FRI Patient taking differently: Take 25 mg by mouth See admin instructions. Take one tablet by mouth on Monday, Wednesday and Fridays only per family and caregiver 11/12/22  Yes Plotnikov, Evie Lacks, MD  valACYclovir (VALTREX) 500 MG tablet Take 1 tablet (500 mg total) by mouth 2 (two) times daily. Patient taking differently: Take 500 mg by mouth daily as needed (for outbreaks). 08/22/21  Yes Plotnikov, Evie Lacks, MD  vitamin B-12 (CYANOCOBALAMIN) 1000 MCG tablet TAKE 1 TABLET (1,000 MCG TOTAL) BY MOUTH EVERY DAY Patient taking differently: Take 1,000 mcg by mouth daily. 05/04/22  Yes Plotnikov, Evie Lacks, MD  Alcohol Swabs (TRUE COMFORT ALCOHOL PREP PADS) 70 % PADS Use as directed 04/26/21   Plotnikov, Evie Lacks, MD  Blood Glucose Calibration (TRUE METRIX LEVEL 1) Low SOLN USE AS DIRECTED 04/20/21   Plotnikov, Evie Lacks, MD  calcium carbonate (OS-CAL) 600 MG tablet  Take 1 tablet (600 mg total) by mouth daily. Patient not taking: Reported on 03/01/2023 03/07/21   Plotnikov, Evie Lacks, MD  cephALEXin (KEFLEX) 500 MG capsule Take 1 capsule by mouth 2 (two) times daily. Patient not taking: Reported on 03/01/2023    [provider]  feeding supplement, GLUCERNA SHAKE, (GLUCERNA  SHAKE) LIQD Take 237 mLs by mouth 2 (two) times daily between meals. Patient not taking: Reported on 03/01/2023 07/29/20   Nolberto Hanlon, MD  glucose blood (GNP TRUE METRIX GLUCOSE STRIPS) test strip Use to check blood sugars twice a day 04/26/21   Plotnikov, Evie Lacks, MD  pantoprazole (PROTONIX) 40 MG tablet TAKE 1 TABLET BY MOUTH EVERY DAY Patient not taking: Reported on 03/01/2023 12/16/22   Plotnikov, Evie Lacks, MD  TRUEplus Lancets 33G MISC USE AS DIRECTED TO CHECK BLOOD SUGAR TWICE A DAY 07/23/22   Plotnikov, Evie Lacks, MD   MR SHOULDER LEFT WO CONTRAST  Result Date: 03/03/2023 CLINICAL DATA:  Left shoulder pain for 4 months.  No known injury EXAM: MRI OF THE LEFT SHOULDER WITHOUT CONTRAST TECHNIQUE: Multiplanar, multisequence MR imaging of the shoulder was performed. No intravenous contrast was administered. COMPARISON:  X-ray 03/01/2023 FINDINGS: Rotator cuff: Severe rotator cuff tendinosis. Full-thickness tear involving the majority of the supraspinatus tendon with retraction the level of the humeral head apex. Tear likely involves the anterior margin of the distal infraspinatus tendon. No tears of the subscapularis or teres minor tendons. Muscles: Mild generalized muscle atrophy. Intramuscular edema within the supraspinatus and infraspinatus muscles. Biceps long head:  Not visualized, likely torn and retracted. Acromioclavicular Joint: Moderate-severe degenerative changes of the AC joint. Moderate-large volume of subacromial-subdeltoid bursal fluid. Glenohumeral Joint: Moderate-sized glenohumeral joint effusion. Relatively mild glenohumeral chondral thinning without focal defect. Labrum:  Diffusely degenerated superior and posterior labrum. Bones: No acute fracture. No dislocation. No bone marrow edema. No marrow replacing bone lesion. Other: None. IMPRESSION: 1. Severe rotator cuff tendinosis with full-thickness retracted tear involving the majority of the supraspinatus tendon. Tear likely involves the  anterior margin of the distal infraspinatus tendon. 2. Nonvisualization of the long head of the biceps tendon, likely torn and retracted. 3. Moderate-severe degenerative changes of the [facility] joint. Mild glenohumeral osteoarthritis. 4. Moderate-sized glenohumeral joint effusion. 5. Subacromial-subdeltoid bursitis. Electronically Signed   By: Davina Poke D.O.   On: 03/03/2023 09:41   CT Angio Chest Pulmonary Embolism (PE) W or WO Contrast  Result Date: 03/01/2023 CLINICAL DATA:  Lungs are intermediate probability for PE. Positive D-dimer. EXAM: CT ANGIOGRAPHY CHEST WITH CONTRAST TECHNIQUE: Multidetector CT imaging of the chest was performed using the standard protocol during bolus administration of intravenous contrast. Multiplanar CT image reconstructions and MIPs were obtained to evaluate the vascular anatomy. RADIATION DOSE REDUCTION: This exam was performed according to the departmental dose-optimization program which includes automated exposure control, adjustment of the mA and/or kV according to patient size and/or use of iterative reconstruction technique. CONTRAST:  69m OMNIPAQUE IOHEXOL 350 MG/ML SOLN COMPARISON:  Chest CT 08/15/2017 FINDINGS: Cardiovascular: Heart is enlarged. Aortic is ectatic without focal dilatation. There is adequate opacification of the pulmonary arteries to the segmental level. There is no evidence for pulmonary embolism. There are atherosclerotic calcifications of the aorta. Mediastinum/Nodes: No enlarged mediastinal, hilar, or axillary lymph nodes. Esophagus and trachea are within normal limits. The thyroid gland is diffusely heterogeneous. There are calcified mediastinal and right hilar lymph nodes. Lungs/Pleura: Lungs are clear. No pleural effusion or pneumothorax. Upper Abdomen: No acute abnormality. There are calcified  granulomas in the liver and spleen. Rounded mildly hyperdense area in the superior pole of the left kidney is new from prior measuring 9 mm. Musculoskeletal:  The bones are osteopenic. Degenerative changes affect the spine. Review of the MIP images confirms the above findings. IMPRESSION: 1. No evidence for pulmonary embolism or other acute cardiopulmonary process. 2. Cardiomegaly. 3. New 9 mm hyperdense lesion in the superior pole of the left kidney. This may represent a hyperdense cyst, but is indeterminate. This can be further evaluated with ultrasound. Aortic Atherosclerosis (ICD10-I70.0). Electronically Signed   By: Ronney Asters M.D.   On: 03/01/2023 21:40   VAS [place] LOWER EXTREMITY VENOUS (DVT)  Result Date: 03/01/2023  Lower Venous DVT Study Patient Name:  Breanna [name]  Date of Exam:   03/01/2023 Medical Rec #: [id number]        Accession #:    [id number] Date of Birth: May 02, 1931         Patient Gender: F Patient Age:   24 years Exam Location:  [facility] Procedure:      VAS [place] LOWER EXTREMITY VENOUS (DVT) Referring Phys: RIPUDEEP RAI --------------------------------------------------------------------------------  Indications: Edema.  Risk Factors: None identified. Limitations: Poor ultrasound/tissue interface. Comparison Study: No prior studies. Performing Technologist: Oliver Hum RVT  Examination Guidelines: A complete evaluation includes B-mode imaging, spectral Doppler, color Doppler, and power Doppler as needed of all accessible portions of each vessel. Bilateral testing is considered an integral part of a complete examination. Limited examinations for reoccurring indications may be performed as noted. The reflux portion of the exam is performed with the patient in reverse Trendelenburg.  +---------+---------------+---------+-----------+----------+--------------+ RIGHT    CompressibilityPhasicitySpontaneityPropertiesThrombus Aging +---------+---------------+---------+-----------+----------+--------------+ CFV      Full           Yes      Yes                                  +---------+---------------+---------+-----------+----------+--------------+ SFJ      Full                                                        +---------+---------------+---------+-----------+----------+--------------+ FV Prox  Full                                                        +---------+---------------+---------+-----------+----------+--------------+ FV Mid   Full                                                        +---------+---------------+---------+-----------+----------+--------------+ FV DistalFull                                                        +---------+---------------+---------+-----------+----------+--------------+ PFV      Full                                                        +---------+---------------+---------+-----------+----------+--------------+  POP      Full           Yes      Yes                                 +---------+---------------+---------+-----------+----------+--------------+ PTV      Full                                                        +---------+---------------+---------+-----------+----------+--------------+ PERO     Full                                                        +---------+---------------+---------+-----------+----------+--------------+   +---------+---------------+---------+-----------+----------+--------------+ LEFT     CompressibilityPhasicitySpontaneityPropertiesThrombus Aging +---------+---------------+---------+-----------+----------+--------------+ CFV      Full           Yes      Yes                                 +---------+---------------+---------+-----------+----------+--------------+ SFJ      Full                                                        +---------+---------------+---------+-----------+----------+--------------+ FV Prox  Full                                                         +---------+---------------+---------+-----------+----------+--------------+ FV Mid   Full                                                        +---------+---------------+---------+-----------+----------+--------------+ FV DistalFull                                                        +---------+---------------+---------+-----------+----------+--------------+ PFV      Full                                                        +---------+---------------+---------+-----------+----------+--------------+ POP      Full           Yes      Yes                                 +---------+---------------+---------+-----------+----------+--------------+  PTV      Full                                                        +---------+---------------+---------+-----------+----------+--------------+ PERO     Full                                                        +---------+---------------+---------+-----------+----------+--------------+    Summary: RIGHT: - There is no evidence of deep vein thrombosis in the lower extremity.  - No cystic structure found in the popliteal fossa.  LEFT: - There is no evidence of deep vein thrombosis in the lower extremity.  - No cystic structure found in the popliteal fossa.  *See table(s) above for measurements and observations.    Preliminary      Positive ROS: All other systems have been reviewed and were otherwise negative with the exception of those mentioned in the HPI and as above.  Physical Exam: General: No acute distress Cardiovascular: No pedal edema Respiratory: No cyanosis, no use of accessory musculature GI: No organomegaly, abdomen is soft and non-tender Skin: No lesions in the area of chief complaint Neurologic: Sensation intact distally Psychiatric: Patient is at baseline mood and affect Lymphatic: No axillary or cervical lymphadenopathy  MUSCULOSKELETAL:  Tenderness positive about the lateral deltoid.  She able to  forward elevate to 90 degrees with external rotation at the side to 30.  Internal rotation deferred today.  Plus radial pulse.  Sensation intact throughout  Independent Imaging Review: MRI left shoulder: Full-thickness tear involving the supraspinatus without retraction  Assessment: 89 year old female with a left supraspinatus tear which is chronic and degenerative in nature.  At this time I would recommend topical Voltaren gel as well as a lidocaine patch to be applied directly to the left shoulder.  The sling that she is doing really can only be used for comfort as I do not want her immobilized in this which could result in feezing.  I discussed that I can see her outpatient and perform a subacromial injection with steroid as needed  Plan: Follow-up outpatient as needed  Thank you for the consult and the opportunity to see Ms. Nickola Major, MD [facility] 11:49 AM

## 2023-03-03 NOTE — Discharge Summary (Signed)
Physician Discharge Summary   Patient: Breanna [name] MRN: [id number] DOB: 1931-09-02  Admit date:     02/28/2023  Discharge date: 03/03/23  Discharge Physician: Estill Cotta   PCP: Cassandria Anger, MD   Recommendations at discharge:   Patient recommended conservative management for left rotator cuff tear including Voltaren gel, Lidoderm patch, tramadol as needed, sling for comfort.  She will follow-up with orthopedics in 2 weeks for further management. Outpatient follow-up with cardiology scheduled for stress test. CTA chest incidental showed 9 mm hyperdense lesion in the superior pole of left kidney, may represent hyperdense cyst, indeterminate, can be evaluated with ultrasound.  Recommend outpatient workup if needed   Discharge Diagnoses:  Atypical chest pain with elevated troponin Left shoulder pain due to rotator cuff tear   DM2 (diabetes mellitus, type 2) (HCC)   Essential hypertension   GERD (gastroesophageal reflux disease)   History of anemia due to chronic kidney disease Bradycardia   Stage 3b chronic kidney disease (CKD) [facility])   Depression    Hospital Course:  Patient is a 89 year old female with HTN, diabetes mellitus type 2, CKD 3B, anemia of chronic disease, presented with left shoulder pain.  Patient reported 3 to 4 months of recurrent sharp nonradiating left shoulder discomfort, worsens with certain movements.  Denies any preceding trauma to the left shoulder.  She was recently seen for this complaint and was recommended physical therapy however has not been able to establish with PT. Also reported pain under her left breast, intermittent, worse with movement and left arm.  No prior history of CAD.  She has history of right-sided rotator cuff tear prompting repair of right rotator cuff in 2008.  No focal weakness, paresthesias, no chest pain, shortness of breath.  No recent cardiac workup.  Assessment and Plan:   atypical chest pain, elevated troponin -Risk  factors including diabetes mellitus, HTN, CKD.  EKG showed no acute ischemic abnormalities, troponin 45-> 51-> 45 -Chest x-ray showed subsegmental atelectasis in the left upper and lower lobes, no infiltrates -2D echo showed EF of 55 to 60%, G2 DD, no regional WMA -D-dimer 2.13, CTA negative for acute PE, cardiomegaly.  Doppler ultrasound lower extremities negative for DVT -Cardiology recommendations reviewed, do not suspect active ischemia, recommended outpatient stress test, outpatient follow-up with [facility] scheduled on 03/12/2023      Left shoulder pain likely rotator cuff injury -Left shoulder x-ray showed mild superior subluxation of the humeral head relative to glenoid can be seen with rotator cuff pathology, no fracture or dislocation.  Patient has a history of right rotator cuff injury status post repair for the right shoulder. -MRI of the left shoulder showed severe rotator cuff tendinosis with full-thickness retracted tear involving the majority of the supraspinatus tendon, moderate to severe degenerative changes of the [facility] joint, moderate-sized glenohumeral joint effusion. -Orthopedics was consulted, seen by Dr. Sammuel Hines, recommended Voltaren gel, Lidoderm patch and pain control.  Sling for comfort as needed.  Will follow outpatient for further management.     DM2 (diabetes mellitus, type 2) (HCC) -Hb A1c 6.2 on 02/13/2023 -Patient was placed on sliding scale insulin while inpatient      Essential hypertension -BP controlled, continue losartan, amlodipine, spironolactone  -Heart rate on the lower side 47-51, Coreg discontinued. -Losartan  increased to 100 mg daily     GERD (gastroesophageal reflux disease) -Continue Protonix     History of anemia due to chronic kidney disease -H&H stable, close to baseline, 10-11     Stage  3b chronic kidney disease (CKD) (HCC) -Creatinine stable, at baseline.,  1-1.3 -CTA chest incidental showed 9 mm hyperdense lesion in the superior pole of left  kidney, may represent hyperdense cyst, indeterminate, can be evaluated with ultrasound.  Recommend outpatient workup if needed     Estimated body mass index is 20.81 kg/m as calculated from the following:   Height as of this encounter: '5\' 2"'$  (1.575 m).   Weight as of this encounter: 51.6 kg.     Pain control - [employer] Controlled Substance Reporting System database was reviewed. and patient was instructed, not to drive, operate heavy machinery, perform activities at heights, swimming or participation in water activities or provide baby-sitting services while on Pain, Sleep and Anxiety Medications; until their outpatient Physician has advised to do so again. Also recommended to not to take more than prescribed Pain, Sleep and Anxiety Medications.  Consultants: Cardiology, orthopedics Procedures performed: 2D echo, MRI left shoulder Disposition: Home Diet recommendation: Carb modified diet  DISCHARGE MEDICATION: Allergies as of 03/03/2023       Reactions   Verapamil Shortness Of Breath   REACTION: SOB   Aspirin Itching   But tolerates low dose   Atenolol    REACTION: fatigue   Codeine Itching   Codeine Sulfate    Hydrochlorothiazide    REACTION: gout   Hydrocodone     side effects - hallucinations   Ibuprofen    Upset stomach w/high doses   Lisinopril    REACTION: cough   Onion Other (See Comments)   Dry mouth/ gets sores   Shellfish Allergy Swelling   Patient stated she does not eat shellfish, she swells up on different parts of the body   Valsartan Itching   Adhesive  [tape] Rash   Other Rash        Medication List     STOP taking these medications    carvedilol 25 MG tablet Commonly known as: COREG       TAKE these medications    acetaminophen 325 MG tablet Commonly known as: TYLENOL Take 2 tablets (650 mg total) by mouth every 6 (six) hours as needed for moderate pain or fever.   allopurinol 100 MG tablet Commonly known as: ZYLOPRIM Take 1  tablet (100 mg total) by mouth daily.   amLODipine 5 MG tablet Commonly known as: NORVASC Take 1 tablet by mouth daily.   ascorbic acid 500 MG tablet Commonly known as: VITAMIN C TAKE 1 TABLET BY MOUTH EVERY DAY   Aspirin Low Dose 81 MG tablet Generic drug: aspirin EC TAKE 1 TABLET BY MOUTH DAILY. SWALLOW WHOLE. What changed: See the new instructions.   atropine 1 % ophthalmic solution Place 1 drop into both eyes daily.   brimonidine 0.2 % ophthalmic solution Commonly known as: ALPHAGAN Place 1 drop into the right eye 2 (two) times daily.   calcium carbonate 1500 (600 Ca) MG Tabs tablet Commonly known as: OSCAL TAKE 1 TABLET BY MOUTH EVERY DAY What changed: when to take this   cyanocobalamin 1000 MCG tablet Commonly known as: VITAMIN B12 TAKE 1 TABLET (1,000 MCG TOTAL) BY MOUTH EVERY DAY What changed: See the new instructions.   diclofenac sodium 1 % Gel Commonly known as: VOLTAREN Apply 2 g topically 3 (three) times daily. To left shoulder What changed:  when to take this additional instructions   docusate sodium 100 MG capsule Commonly known as: COLACE TAKE 1 CAPSULE BY MOUTH TWICE A DAY   dorzolamide-timolol 2-0.5 % ophthalmic  solution Commonly known as: COSOPT Place 1 drop into both eyes 2 (two) times daily.   feeding supplement Liqd Take 237 mLs by mouth daily at 2 PM.   gabapentin 300 MG capsule Commonly known as: NEURONTIN Gabapentin 300 mg - take one with breakfast, one with supper and one at 3 am if woke up with pain What changed:  how much to take how to take this when to take this additional instructions   GNP True Metrix Glucose Strips test strip Generic drug: glucose blood Use to check blood sugars twice a day   latanoprost 0.005 % ophthalmic solution Commonly known as: XALATAN Place 1 drop into both eyes at bedtime.   lidocaine 5 % Commonly known as: LIDODERM Place 1 patch onto the skin daily. Remove & Discard patch within 12 hours or  as directed by MD. Apply to left shoulder   lidocaine-prilocaine cream Commonly known as: EMLA Apply 1 application topically as needed. What changed:  when to take this reasons to take this   losartan 100 MG tablet Commonly known as: COZAAR Take 1 tablet (100 mg total) by mouth daily. What changed:  medication strength how much to take   metFORMIN 500 MG tablet Commonly known as: GLUCOPHAGE Take 1 tablet (500 mg total) by mouth daily with breakfast. Must keep scheduled appt for future refills   NON FORMULARY Place 10 drops under the tongue daily as needed (Pain). Medication: CBD oil   pantoprazole 40 MG tablet Commonly known as: PROTONIX TAKE 1 TABLET BY MOUTH EVERY DAY   polyethylene glycol 17 g packet Commonly known as: MIRALAX / GLYCOLAX Take 17 g by mouth daily as needed for mild constipation.   senna 8.6 MG Tabs tablet Commonly known as: SENOKOT Take 1 tablet (8.6 mg total) by mouth 2 (two) times daily.   sertraline 100 MG tablet Commonly known as: ZOLOFT TAKE 1 TABLET BY MOUTH EVERY DAY   spironolactone 25 MG tablet Commonly known as: ALDACTONE TAKE 1 TAB BY MOUTH MON - WED - FRI What changed:  how much to take how to take this when to take this additional instructions   traMADol 50 MG tablet Commonly known as: ULTRAM Take 1 tablet (50 mg total) by mouth every 8 (eight) hours as needed for severe pain.   True Comfort Alcohol Prep Pads 70 % Pads Use as directed   True Metrix Level 1 Low Soln USE AS DIRECTED   TRUEplus Lancets 33G Misc USE AS DIRECTED TO CHECK BLOOD SUGAR TWICE A DAY   valACYclovir 500 MG tablet Commonly known as: Valtrex Take 1 tablet (500 mg total) by mouth daily as needed (for outbreaks).   Vitamin D3 25 MCG (1000 UT) capsule Generic drug: Cholecalciferol Take 1 capsule (1,000 Units total) by mouth daily.        Follow-up Information     Vanetta Mulders, MD. Schedule an appointment as soon as possible for a visit in 2  week(s).   Specialty: Orthopedic Surgery Why: for hospital follow-up Contact information: [address] [place] [address] [phone]         Plotnikov, Evie Lacks, MD. Schedule an appointment as soon as possible for a visit in 2 week(s).   Specialty: Internal Medicine Why: for hospital follow-up Contact information: [place] [address] [phone]         Richardson Dopp T, PA-C Follow up on 03/12/2023.   Specialties: Cardiology, Physician Assistant Why: at 10:55AM, please arrive 15 minutes  early. Contact information: [address] [place] [address] [phone]                Discharge Exam: Danley Danker Weights   02/28/23 1300 03/01/23 0609  Weight: 52.6 kg 51.6 kg   S: No chest pain, left shoulder pain, chronic for last 4 months.  No acute issues overnight.  Sitting up in the chair, wants to go home today.  BP (!) 142/74 (BP Location: Right Arm)   Pulse (!) 50   Temp 98.3 F (36.8 C) (Oral)   Resp 16   Ht '5\' 2"'$  (1.575 m)   Wt 51.6 kg   SpO2 100%   BMI 20.81 kg/m   Physical Exam General: Alert and oriented x 3, NAD Cardiovascular: S1 S2 clear, RRR.  Respiratory: CTAB, no wheezing, rales or rhonchi Gastrointestinal: Soft, nontender, nondistended, NBS Ext: no pedal edema bilaterally Neuro: no new deficits Psych: Normal affect   Condition at discharge: fair  The results of significant diagnostics from this hospitalization (including imaging, microbiology, ancillary and laboratory) are listed below for reference.   Imaging Studies: MR SHOULDER LEFT WO CONTRAST  Result Date: 03/03/2023 CLINICAL DATA:  Left shoulder pain for 4 months.  No known injury EXAM: MRI OF THE LEFT SHOULDER WITHOUT CONTRAST TECHNIQUE: Multiplanar, multisequence MR imaging of the shoulder was performed. No intravenous contrast was administered. COMPARISON:  X-ray 03/01/2023 FINDINGS: Rotator cuff: Severe rotator cuff  tendinosis. Full-thickness tear involving the majority of the supraspinatus tendon with retraction the level of the humeral head apex. Tear likely involves the anterior margin of the distal infraspinatus tendon. No tears of the subscapularis or teres minor tendons. Muscles: Mild generalized muscle atrophy. Intramuscular edema within the supraspinatus and infraspinatus muscles. Biceps long head:  Not visualized, likely torn and retracted. Acromioclavicular Joint: Moderate-severe degenerative changes of the AC joint. Moderate-large volume of subacromial-subdeltoid bursal fluid. Glenohumeral Joint: Moderate-sized glenohumeral joint effusion. Relatively mild glenohumeral chondral thinning without focal defect. Labrum:  Diffusely degenerated superior and posterior labrum. Bones: No acute fracture. No dislocation. No bone marrow edema. No marrow replacing bone lesion. Other: None. IMPRESSION: 1. Severe rotator cuff tendinosis with full-thickness retracted tear involving the majority of the supraspinatus tendon. Tear likely involves the anterior margin of the distal infraspinatus tendon. 2. Nonvisualization of the long head of the biceps tendon, likely torn and retracted. 3. Moderate-severe degenerative changes of the [facility] joint. Mild glenohumeral osteoarthritis. 4. Moderate-sized glenohumeral joint effusion. 5. Subacromial-subdeltoid bursitis. Electronically Signed   By: Davina Poke D.O.   On: 03/03/2023 09:41   CT Angio Chest Pulmonary Embolism (PE) W or WO Contrast  Result Date: 03/01/2023 CLINICAL DATA:  Lungs are intermediate probability for PE. Positive D-dimer. EXAM: CT ANGIOGRAPHY CHEST WITH CONTRAST TECHNIQUE: Multidetector CT imaging of the chest was performed using the standard protocol during bolus administration of intravenous contrast. Multiplanar CT image reconstructions and MIPs were obtained to evaluate the vascular anatomy. RADIATION DOSE REDUCTION: This exam was performed according to the  departmental dose-optimization program which includes automated exposure control, adjustment of the mA and/or kV according to patient size and/or use of iterative reconstruction technique. CONTRAST:  29m OMNIPAQUE IOHEXOL 350 MG/ML SOLN COMPARISON:  Chest CT 08/15/2017 FINDINGS: Cardiovascular: Heart is enlarged. Aortic is ectatic without focal dilatation. There is adequate opacification of the pulmonary arteries to the segmental level. There is no evidence for pulmonary embolism. There are atherosclerotic calcifications of the aorta. Mediastinum/Nodes: No enlarged mediastinal, hilar, or axillary lymph nodes. Esophagus and trachea  are within normal limits. The thyroid gland is diffusely heterogeneous. There are calcified mediastinal and right hilar lymph nodes. Lungs/Pleura: Lungs are clear. No pleural effusion or pneumothorax. Upper Abdomen: No acute abnormality. There are calcified granulomas in the liver and spleen. Rounded mildly hyperdense area in the superior pole of the left kidney is new from prior measuring 9 mm. Musculoskeletal: The bones are osteopenic. Degenerative changes affect the spine. Review of the MIP images confirms the above findings. IMPRESSION: 1. No evidence for pulmonary embolism or other acute cardiopulmonary process. 2. Cardiomegaly. 3. New 9 mm hyperdense lesion in the superior pole of the left kidney. This may represent a hyperdense cyst, but is indeterminate. This can be further evaluated with ultrasound. Aortic Atherosclerosis (ICD10-I70.0). Electronically Signed   By: Ronney Asters M.D.   On: 03/01/2023 21:40   VAS [place] LOWER EXTREMITY VENOUS (DVT)  Result Date: 03/01/2023  Lower Venous DVT Study Patient Name:  RAIVYN BARBOT  Date of Exam:   03/01/2023 Medical Rec #: [id number]        Accession #:    [id number] Date of Birth: Apr 17, 1931         Patient Gender: F Patient Age:   33 years Exam Location:  [facility] Procedure:      VAS [place] LOWER EXTREMITY VENOUS (DVT) Referring  Phys: Darrelle Wiberg --------------------------------------------------------------------------------  Indications: Edema.  Risk Factors: None identified. Limitations: Poor ultrasound/tissue interface. Comparison Study: No prior studies. Performing Technologist: Oliver Hum RVT  Examination Guidelines: A complete evaluation includes B-mode imaging, spectral Doppler, color Doppler, and power Doppler as needed of all accessible portions of each vessel. Bilateral testing is considered an integral part of a complete examination. Limited examinations for reoccurring indications may be performed as noted. The reflux portion of the exam is performed with the patient in reverse Trendelenburg.  +---------+---------------+---------+-----------+----------+--------------+ RIGHT    CompressibilityPhasicitySpontaneityPropertiesThrombus Aging +---------+---------------+---------+-----------+----------+--------------+ CFV      Full           Yes      Yes                                 +---------+---------------+---------+-----------+----------+--------------+ SFJ      Full                                                        +---------+---------------+---------+-----------+----------+--------------+ FV Prox  Full                                                        +---------+---------------+---------+-----------+----------+--------------+ FV Mid   Full                                                        +---------+---------------+---------+-----------+----------+--------------+ FV DistalFull                                                        +---------+---------------+---------+-----------+----------+--------------+  PFV      Full                                                        +---------+---------------+---------+-----------+----------+--------------+ POP      Full           Yes      Yes                                  +---------+---------------+---------+-----------+----------+--------------+ PTV      Full                                                        +---------+---------------+---------+-----------+----------+--------------+ PERO     Full                                                        +---------+---------------+---------+-----------+----------+--------------+   +---------+---------------+---------+-----------+----------+--------------+ LEFT     CompressibilityPhasicitySpontaneityPropertiesThrombus Aging +---------+---------------+---------+-----------+----------+--------------+ CFV      Full           Yes      Yes                                 +---------+---------------+---------+-----------+----------+--------------+ SFJ      Full                                                        +---------+---------------+---------+-----------+----------+--------------+ FV Prox  Full                                                        +---------+---------------+---------+-----------+----------+--------------+ FV Mid   Full                                                        +---------+---------------+---------+-----------+----------+--------------+ FV DistalFull                                                        +---------+---------------+---------+-----------+----------+--------------+ PFV      Full                                                        +---------+---------------+---------+-----------+----------+--------------+  POP      Full           Yes      Yes                                 +---------+---------------+---------+-----------+----------+--------------+ PTV      Full                                                        +---------+---------------+---------+-----------+----------+--------------+ PERO     Full                                                         +---------+---------------+---------+-----------+----------+--------------+    Summary: RIGHT: - There is no evidence of deep vein thrombosis in the lower extremity.  - No cystic structure found in the popliteal fossa.  LEFT: - There is no evidence of deep vein thrombosis in the lower extremity.  - No cystic structure found in the popliteal fossa.  *See table(s) above for measurements and observations.    Preliminary    ECHOCARDIOGRAM COMPLETE  Result Date: 03/01/2023    ECHOCARDIOGRAM REPORT   Patient Name:   LAKIRA HOOT Date of Exam: 03/01/2023 Medical Rec #:  [id number]       Height:       62.0 in Accession #:    [id number]      Weight:       113.8 lb Date of Birth:  03/16/1931        BSA:          1.504 m Patient Age:    58 years        BP:           147/70 mmHg Patient Gender: F               HR:           51 bpm. Exam Location:  Inpatient Procedure: 2D Echo, Cardiac Doppler and Color Doppler Indications:    elevated troponin  History:        Patient has no prior history of Echocardiogram examinations.                 Stroke; Risk Factors:Hypertension and Dyslipidemia.  Sonographer:    Phineas Douglas Referring Phys: [id number] [place]  1. Left ventricular ejection fraction, by estimation, is 55 to 60%. The left ventricle has normal function. The left ventricle has no regional wall motion abnormalities. Left ventricular diastolic parameters are consistent with Grade II diastolic dysfunction (pseudonormalization).  2. Right ventricular systolic function is normal. The right ventricular size is normal. There is mildly elevated pulmonary artery systolic pressure. The estimated right ventricular systolic pressure is [id number] mmHg.  3. Left atrial size was moderately dilated.  4. The mitral valve is degenerative. Mild to moderate mitral valve regurgitation. No evidence of mitral stenosis.  5. Tricuspid valve regurgitation is moderate.  6. The aortic valve is tricuspid. There is moderate calcification  of the aortic valve. Aortic valve regurgitation is trivial. No aortic stenosis is present.  7. The inferior vena  cava is dilated in size with >50% respiratory variability, suggesting right atrial pressure of 8 mmHg. FINDINGS  Left Ventricle: Left ventricular ejection fraction, by estimation, is 55 to 60%. The left ventricle has normal function. The left ventricle has no regional wall motion abnormalities. The left ventricular internal cavity size was normal in size. There is  no left ventricular hypertrophy. Left ventricular diastolic parameters are consistent with Grade II diastolic dysfunction (pseudonormalization). Right Ventricle: The right ventricular size is normal. No increase in right ventricular wall thickness. Right ventricular systolic function is normal. There is mildly elevated pulmonary artery systolic pressure. The tricuspid regurgitant velocity is 3.03  m/s, and with an assumed right atrial pressure of 3 mmHg, the estimated right ventricular systolic pressure is [id number] mmHg. Left Atrium: Left atrial size was moderately dilated. Right Atrium: Right atrial size was normal in size. Pericardium: There is no evidence of pericardial effusion. Mitral Valve: The mitral valve is degenerative in appearance. Mild to moderate mitral valve regurgitation, with posteriorly-directed jet. No evidence of mitral valve stenosis. Tricuspid Valve: The tricuspid valve is normal in structure. Tricuspid valve regurgitation is moderate . No evidence of tricuspid stenosis. Aortic Valve: The aortic valve is tricuspid. There is moderate calcification of the aortic valve. Aortic valve regurgitation is trivial. Aortic regurgitation PHT measures 763 msec. No aortic stenosis is present. Pulmonic Valve: The pulmonic valve was not well visualized. Pulmonic valve regurgitation is trivial. No evidence of pulmonic stenosis. Aorta: The aortic root is normal in size and structure. Venous: The inferior vena cava is dilated in size with  greater than 50% respiratory variability, suggesting right atrial pressure of 8 mmHg. IAS/Shunts: No atrial level shunt detected by color flow Doppler.  LEFT VENTRICLE PLAX 2D LVIDd:         4.20 cm      Diastology LVIDs:         2.90 cm      LV e' medial:    4.05 cm/s LV PW:         1.30 cm      LV E/e' medial:  18.1 LV IVS:        1.40 cm      LV e' lateral:   5.43 cm/s LVOT diam:     1.90 cm      LV E/e' lateral: 13.5 LV SV:         64 LV SV Index:   42 LVOT Area:     2.84 cm  LV Volumes (MOD) LV vol d, MOD A2C: 128.0 ml LV vol d, MOD A4C: 110.0 ml LV vol s, MOD A2C: 51.8 ml LV vol s, MOD A4C: 38.1 ml LV SV MOD A2C:     76.2 ml LV SV MOD A4C:     110.0 ml LV SV MOD BP:      76.8 ml RIGHT VENTRICLE             IVC RV Basal diam:  3.90 cm     IVC diam: 2.10 cm RV S prime:     11.10 cm/s TAPSE (M-mode): 1.7 cm LEFT ATRIUM             Index        RIGHT ATRIUM           Index LA diam:        3.80 cm 2.53 cm/m   RA Area:     12.70 cm LA Vol (A2C):   62.6 ml 41.62 ml/m  RA Volume:  26.30 ml  17.49 ml/m LA Vol (A4C):   81.0 ml 53.85 ml/m LA Biplane Vol: 75.6 ml 50.26 ml/m  AORTIC VALVE LVOT Vmax:   96.00 cm/s LVOT Vmean:  60.100 cm/s LVOT VTI:    0.225 m AI PHT:      763 msec  AORTA Ao Root diam: 3.00 cm Ao Asc diam:  2.70 cm MITRAL VALVE                  TRICUSPID VALVE MV Area (PHT): 3.03 cm       TR Peak grad:   36.7 mmHg MV Decel Time: 250 msec       TR Vmax:        303.00 cm/s MR Peak grad:    159.3 mmHg MR Mean grad:    99.0 mmHg    SHUNTS MR Vmax:         631.00 cm/s  Systemic VTI:  0.22 m MR Vmean:        469.0 cm/s   Systemic Diam: 1.90 cm MR PISA:         1.57 cm MR PISA Eff ROA: 8 mm MR PISA Radius:  0.50 cm MV E velocity: 73.20 cm/s MV A velocity: 51.90 cm/s MV E/A ratio:  1.41 Glori Bickers MD Electronically signed by Glori Bickers MD Signature Date/Time: 03/01/2023/9:39:08 AM    Final    DG Shoulder Left  Result Date: 03/01/2023 CLINICAL DATA:  Left shoulder pain.  No known injury EXAM:  LEFT SHOULDER - 2 VIEW COMPARISON:  None Available. FINDINGS: There is no evidence of fracture or dislocation. Mild superior subluxation of the humeral head relative to the glenoid. Degenerative changes of the acromioclavicular joint. Soft tissues are unremarkable. IMPRESSION: 1. Mild superior subluxation of the humeral head relative to the glenoid, which can be seen with rotator cuff pathology. Degenerative changes of the acromioclavicular joint. 2.  No acute fracture or dislocation. Electronically Signed   By: Darrin Nipper M.D.   On: 03/01/2023 08:17   DG Chest 2 View  Result Date: 02/28/2023 CLINICAL DATA:  Left shoulder pain. EXAM: CHEST - 2 VIEW COMPARISON:  Radiograph 08/24/2022 FINDINGS: Stable heart size allowing for AP technique. Unchanged mediastinal contours. Linear subsegmental opacity in the left upper and lower lobes typical of atelectasis. No pneumothorax or pleural effusion. Normal pulmonary vasculature. Thoracic spondylosis with anterior spurring. There is mild acromioclavicular degenerative change of both shoulders. IMPRESSION: Linear subsegmental atelectasis in the left upper and lower lobes. Electronically Signed   By: Keith Rake M.D.   On: 02/28/2023 15:10    Microbiology: Results for orders placed or performed during the hospital encounter of 07/20/20  SARS Coronavirus 2 by RT PCR (hospital order, performed in [facility] hospital lab) Nasopharyngeal Nasopharyngeal Swab     Status: None   Collection Time: 07/20/20 10:18 PM   Specimen: Nasopharyngeal Swab  Result Value Ref Range Status   SARS Coronavirus 2 NEGATIVE NEGATIVE Final    Comment: (NOTE) SARS-CoV-2 target nucleic acids are NOT DETECTED.  The SARS-CoV-2 RNA is generally detectable in upper and lower respiratory specimens during the acute phase of infection. The lowest concentration of SARS-CoV-2 viral copies this assay can detect is 250 copies / mL. A negative result does not preclude SARS-CoV-2 infection and  should not be used as the sole basis for treatment or other patient management decisions.  A negative result may occur with improper specimen collection / handling, submission of specimen other than nasopharyngeal swab, presence of  viral mutation(s) within the areas targeted by this assay, and inadequate number of viral copies (<250 copies / mL). A negative result must be combined with clinical observations, patient history, and epidemiological information.  Fact Sheet for Patients:   StrictlyIdeas.no  Fact Sheet for Healthcare Providers: BankingDealers.co.za  This test is not yet approved or  cleared by the [place] FDA and has been authorized for detection and/or diagnosis of SARS-CoV-2 by FDA under an Emergency Use Authorization (EUA).  This EUA will remain in effect (meaning this test can be used) for the duration of the COVID-19 declaration under Section 564(b)(1) of the Act, 21 U.S.C. section 360bbb-3(b)(1), unless the authorization is terminated or revoked sooner.  Performed at [facility], [place] [address], [place], [place] [address]   Surgical pcr screen     Status: None   Collection Time: 07/21/20  5:36 AM   Specimen: Nasal Mucosa; Nasal Swab  Result Value Ref Range Status   MRSA, PCR NEGATIVE NEGATIVE Final   Staphylococcus aureus NEGATIVE NEGATIVE Final    Comment: (NOTE) The Xpert SA Assay (FDA approved for NASAL specimens in patients 17 years of age and older), is one component of a comprehensive surveillance program. It is not intended to diagnose infection nor to guide or monitor treatment. Performed at [facility], [place] [address], [place], [place] [address]   SARS Coronavirus 2 by RT PCR (hospital order, performed in [facility] hospital lab) Nasopharyngeal Nasopharyngeal Swab     Status: None   Collection Time: 07/26/20  3:30 PM   Specimen: Nasopharyngeal Swab  Result Value Ref Range Status    SARS Coronavirus 2 NEGATIVE NEGATIVE Final    Comment: (NOTE) SARS-CoV-2 target nucleic acids are NOT DETECTED.  The SARS-CoV-2 RNA is generally detectable in upper and lower respiratory specimens during the acute phase of infection. The lowest concentration of SARS-CoV-2 viral copies this assay can detect is 250 copies / mL. A negative result does not preclude SARS-CoV-2 infection and should not be used as the sole basis for treatment or other patient management decisions.  A negative result may occur with improper specimen collection / handling, submission of specimen other than nasopharyngeal swab, presence of viral mutation(s) within the areas targeted by this assay, and inadequate number of viral copies (<250 copies / mL). A negative result must be combined with clinical observations, patient history, and epidemiological information.  Fact Sheet for Patients:   StrictlyIdeas.no  Fact Sheet for Healthcare Providers: BankingDealers.co.za  This test is not yet approved or  cleared by the [place] FDA and has been authorized for detection and/or diagnosis of SARS-CoV-2 by FDA under an Emergency Use Authorization (EUA).  This EUA will remain in effect (meaning this test can be used) for the duration of the COVID-19 declaration under Section 564(b)(1) of the Act, 21 U.S.C. section 360bbb-3(b)(1), unless the authorization is terminated or revoked sooner.  Performed at [facility], [place] [address], [place], [place] [address]   Culture, Urine     Status: Abnormal   Collection Time: 07/27/20  4:41 PM   Specimen: Urine, Catheterized  Result Value Ref Range Status   Specimen Description URINE, CATHETERIZED  Final   Special Requests   Final    NONE Performed at [facility], [place] [address], [place], [place] [address]    Culture >=100,000 COLONIES/mL PSEUDOMONAS AERUGINOSA (A)  Final   Report Status 07/29/2020 FINAL   Final   Organism ID, Bacteria PSEUDOMONAS  AERUGINOSA (A)  Final      Susceptibility   Pseudomonas aeruginosa - MIC*    CEFTAZIDIME 4 SENSITIVE Sensitive     CIPROFLOXACIN <=0.25 SENSITIVE Sensitive     GENTAMICIN <=1 SENSITIVE Sensitive     IMIPENEM 2 SENSITIVE Sensitive     PIP/TAZO <=4 SENSITIVE Sensitive     CEFEPIME 1 SENSITIVE Sensitive     * >=100,000 COLONIES/mL PSEUDOMONAS AERUGINOSA    Labs: CBC: Recent Labs  Lab 02/28/23 1540 03/01/23 0550 03/02/23 0623  WBC 6.0 4.2 4.1  NEUTROABS  --  2.7  --   HGB 10.2* 9.8* 9.9*  HCT 31.6* 30.8* 31.0*  MCV 88.0 89.8 88.6  PLT 127* 116* [id number]*   Basic Metabolic Panel: Recent Labs  Lab 02/28/23 1540 03/01/23 0550 03/02/23 0623  NA 140 138 133*  K 4.0 4.2 4.0  CL 106 106 103  CO2 '25 25 24  '$ GLUCOSE 118* 99 98  BUN 30* 26* 27*  CREATININE 1.23* 0.97 1.26*  CALCIUM 9.5 8.9 8.5*  MG  --  2.0  --    Liver Function Tests: Recent Labs  Lab 03/01/23 0550  AST 18  ALT 10  ALKPHOS 71  BILITOT 0.5  PROT 6.4*  ALBUMIN 2.9*   CBG: Recent Labs  Lab 03/02/23 1206 03/02/23 1658 03/02/23 2101 03/03/23 0759 03/03/23 1204  GLUCAP 84 124* 110* 90 88    Discharge time spent: greater than 30 minutes.  Signed: Estill Cotta, MD [facility] 03/03/2023

## 2023-03-06 ENCOUNTER — Ambulatory Visit: Payer: Medicare PPO | Admitting: Internal Medicine

## 2023-03-06 DIAGNOSIS — M25512 Pain in left shoulder: Secondary | ICD-10-CM | POA: Diagnosis not present

## 2023-03-06 DIAGNOSIS — N189 Chronic kidney disease, unspecified: Secondary | ICD-10-CM | POA: Diagnosis not present

## 2023-03-06 DIAGNOSIS — I700 Atherosclerosis of aorta: Secondary | ICD-10-CM | POA: Diagnosis not present

## 2023-03-06 DIAGNOSIS — I2510 Atherosclerotic heart disease of native coronary artery without angina pectoris: Secondary | ICD-10-CM | POA: Diagnosis not present

## 2023-03-06 DIAGNOSIS — I129 Hypertensive chronic kidney disease with stage 1 through stage 4 chronic kidney disease, or unspecified chronic kidney disease: Secondary | ICD-10-CM | POA: Diagnosis not present

## 2023-03-06 DIAGNOSIS — G8929 Other chronic pain: Secondary | ICD-10-CM | POA: Diagnosis not present

## 2023-03-06 DIAGNOSIS — E1151 Type 2 diabetes mellitus with diabetic peripheral angiopathy without gangrene: Secondary | ICD-10-CM | POA: Diagnosis not present

## 2023-03-06 DIAGNOSIS — M545 Low back pain, unspecified: Secondary | ICD-10-CM | POA: Diagnosis not present

## 2023-03-06 DIAGNOSIS — E1122 Type 2 diabetes mellitus with diabetic chronic kidney disease: Secondary | ICD-10-CM | POA: Diagnosis not present

## 2023-03-08 ENCOUNTER — Telehealth: Payer: Self-pay | Admitting: Internal Medicine

## 2023-03-08 NOTE — Telephone Encounter (Signed)
Breanna [name] with [facility] calls today requesting verbal orders for PT. She would like physical therapy orders be placed with PT for the frequency of  Once a week for eight weeks  These orders can be placed at   [facility]: [phone]  VM is secure  They also that PT did really well but showed a slight elevation in BP (160/80, but they did say that this was after PT had became a little agitated with all the questions being asked).

## 2023-03-08 NOTE — Telephone Encounter (Signed)
Called Breanna [name] no answer LMOM ok for PT. Pt is compliance w/ appts. Next appt 03/19/23.Marland KitchenJohny [name]

## 2023-03-11 NOTE — Progress Notes (Unsigned)
Office Visit    Patient Name: Breanna [name] Date of Encounter: 03/11/2023  Primary Care Provider:  Cassandria Anger, MD Primary Cardiologist:  None Primary Electrophysiologist: None  Chief Complaint    Breanna [name] is a 89 y.o. female with PMH of coronary artery calcifications, cerebellar CVA 2010, GERD, DM type II, CKD stage III, HTN, HLD, GERD who presents today for posthospital follow-up of chest pain.  Past Medical History    Past Medical History:  Diagnosis Date   Adrenal adenoma 2006   Boils 2009   Cholelithiasis    asympt. w/normal HIDA 03/2010   CVA (cerebral infarction) 2010   Cerebellar   Diverticulitis    Esophageal spasm 2011   GERD (gastroesophageal reflux disease)    Gout    History of colon polyps    HTN (hypertension)    Hydronephrosis    LEFT/ Surgical intervention   Hyperlipidemia    LBP (low back pain)    Osteoarthritis    Pulmonary HTN (HCC)    Stress    Type II or unspecified type diabetes mellitus without mention of complication, not stated as uncontrolled    Past Surgical History:  Procedure Laterality Date   ABDOMINAL HYSTERECTOMY     APPENDECTOMY     2020   BACK SURGERY     BREAST BIOPSY     RIGHT   CATARACT EXTRACTION, BILATERAL     COLECTOMY  2006   Sigmoid   FOOT SURGERY     BILATERAL   HAMMER TOE SURGERY     INTRAMEDULLARY (IM) NAIL INTERTROCHANTERIC Right 07/21/2020   Procedure: INTRAMEDULLARY (IM) NAIL INTERTROCHANTRIC;  Surgeon: Renette Butters, MD;  Location: [facility];  Service: Orthopedics;  Laterality: Right;   ROTATOR CUFF REPAIR  2008   RIGHT   TOTAL KNEE ARTHROPLASTY  2003   LEFT   VARICOSE VEIN SURGERY     vein stripping/lower extremities    Allergies  Allergies  Allergen Reactions   Verapamil Shortness Of Breath    REACTION: SOB   Aspirin Itching    But tolerates low dose   Atenolol     REACTION: fatigue   Codeine Itching   Codeine Sulfate    Hydrochlorothiazide     REACTION: gout    Hydrocodone       side effects - hallucinations   Ibuprofen     Upset stomach w/high doses   Lisinopril     REACTION: cough   Onion Other (See Comments)    Dry mouth/ gets sores   Shellfish Allergy Swelling    Patient stated she does not eat shellfish, she swells up on different parts of the body   Valsartan Itching   Adhesive  [Tape] Rash   Other Rash    History of Present Illness    Breanna [name]  is a 89 year old female with the above mention past medical history who presents today for hospital follow-up of of left shoulder pain and chest discomfort.  Ms. Burford has a history of coronary calcifications that were noted on a previous chest CT in 2018.  She presented to the ED on 02/28/2023 with complaint of sharp radiating chest and shoulder pain.  She reported that her discomfort has occurred over the past 3 to 4 months.  She also reported pain under her left breast.  She also noted worsening dyspnea and exertional fatigue over the last month.  She was admitted and troponins were minimally minimally elevated and flat  at 67 with blood pressures elevated in the 170s over 80s.  Shoulder x-ray showed mild superior subluxation of humeral head relative to rotator cuff tear.  2D echo was completed with EF of 55 to 60% and no RWMA with grade 2 DD.  D-dimer was elevated and chest CT was completed that showed no acute changes or PEs.  There was of note a rounded mildly hyperdense area in the superior pole of the left kidney measuring 9 mm.  It was determined that chest discomfort was noncardiac in nature and caused by shoulder pain.  There was titration completed for her blood pressure with losartan increased.   Since last being seen in the office patient reports***.  Patient denies chest pain, palpitations, dyspnea, PND, orthopnea, nausea, vomiting, dizziness, syncope, edema, weight gain, or early satiety.     ***Notes:  Home Medications    Current Outpatient Medications  Medication Sig  Dispense Refill   acetaminophen (TYLENOL) 325 MG tablet Take 2 tablets (650 mg total) by mouth every 6 (six) hours as needed for moderate pain or fever.     Alcohol Swabs (TRUE COMFORT ALCOHOL PREP PADS) 70 % PADS Use as directed 300 each 1   allopurinol (ZYLOPRIM) 100 MG tablet Take 1 tablet (100 mg total) by mouth daily. 90 tablet 3   amLODipine (NORVASC) 5 MG tablet Take 1 tablet by mouth daily.     ascorbic acid (VITAMIN C) 500 MG tablet TAKE 1 TABLET BY MOUTH EVERY DAY (Patient taking differently: Take 500 mg by mouth daily.) 30 tablet 11   ASPIRIN LOW DOSE 81 MG tablet TAKE 1 TABLET BY MOUTH DAILY. SWALLOW WHOLE. (Patient taking differently: Take 81 mg by mouth daily.) 90 tablet 3   atropine 1 % ophthalmic solution Place 1 drop into both eyes daily.     Blood Glucose Calibration (TRUE METRIX LEVEL 1) Low SOLN USE AS DIRECTED 3 each 3   brimonidine (ALPHAGAN) 0.2 % ophthalmic solution Place 1 drop into the right eye 2 (two) times daily.     calcium carbonate (OSCAL) 1500 (600 Ca) MG TABS tablet TAKE 1 TABLET BY MOUTH EVERY DAY (Patient taking differently: Take 1,500 mg by mouth daily with breakfast.) 30 tablet 11   Cholecalciferol (VITAMIN D3) 25 MCG (1000 UT) CAPS Take 1 capsule (1,000 Units total) by mouth daily. 90 capsule 3   diclofenac sodium (VOLTAREN) 1 % GEL Apply 2 g topically 3 (three) times daily. To left shoulder 200 g 3   docusate sodium (COLACE) 100 MG capsule TAKE 1 CAPSULE BY MOUTH TWICE A DAY (Patient taking differently: Take 100 mg by mouth 2 (two) times daily.) 180 capsule 1   dorzolamide-timolol (COSOPT) 22.3-6.8 MG/ML ophthalmic solution Place 1 drop into both eyes 2 (two) times daily.      feeding supplement, ENSURE ENLIVE, (ENSURE ENLIVE) LIQD Take 237 mLs by mouth daily at 2 PM. 237 mL 12   gabapentin (NEURONTIN) 300 MG capsule Gabapentin 300 mg - take one with breakfast, one with supper and one at 3 am if woke up with pain (Patient taking differently: Take 300 mg by  mouth 3 (three) times daily.) 90 capsule 5   glucose blood (GNP TRUE METRIX GLUCOSE STRIPS) test strip Use to check blood sugars twice a day 200 each 2   latanoprost (XALATAN) 0.005 % ophthalmic solution Place 1 drop into both eyes at bedtime.      lidocaine (LIDODERM) 5 % Place 1 patch onto the skin daily. Remove & Discard patch  within 12 hours or as directed by MD. Apply to left shoulder 30 patch 0   lidocaine-prilocaine (EMLA) cream Apply 1 application topically as needed. (Patient taking differently: Apply 1 application  topically daily as needed (For pain).) 30 g 0   losartan (COZAAR) 100 MG tablet Take 1 tablet (100 mg total) by mouth daily. 30 tablet 3   metFORMIN (GLUCOPHAGE) 500 MG tablet Take 1 tablet (500 mg total) by mouth daily with breakfast. Must keep scheduled appt for future refills 90 tablet 1   NON FORMULARY Place 10 drops under the tongue daily as needed (Pain). Medication: CBD oil     pantoprazole (PROTONIX) 40 MG tablet TAKE 1 TABLET BY MOUTH EVERY DAY (Patient not taking: Reported on 03/01/2023) 90 tablet 3   polyethylene glycol (MIRALAX / GLYCOLAX) 17 g packet Take 17 g by mouth daily as needed for mild constipation. 14 each 0   senna (SENOKOT) 8.6 MG TABS tablet Take 1 tablet (8.6 mg total) by mouth 2 (two) times daily. 120 tablet 0   sertraline (ZOLOFT) 100 MG tablet TAKE 1 TABLET BY MOUTH EVERY DAY 90 tablet 1   spironolactone (ALDACTONE) 25 MG tablet TAKE 1 TAB BY MOUTH MON - WED - FRI (Patient taking differently: Take 25 mg by mouth See admin instructions. Take one tablet by mouth on Monday, Wednesday and Fridays only per family and caregiver) 90 tablet 1   traMADol (ULTRAM) 50 MG tablet Take 1 tablet (50 mg total) by mouth every 8 (eight) hours as needed for severe pain. 30 tablet 0   TRUEplus Lancets 33G MISC USE AS DIRECTED TO CHECK BLOOD SUGAR TWICE A DAY 200 each 1   valACYclovir (VALTREX) 500 MG tablet Take 1 tablet (500 mg total) by mouth daily as needed (for  outbreaks).     vitamin B-12 (CYANOCOBALAMIN) 1000 MCG tablet TAKE 1 TABLET (1,000 MCG TOTAL) BY MOUTH EVERY DAY (Patient taking differently: Take 1,000 mcg by mouth daily.) 90 tablet 3   No current facility-administered medications for this visit.     Review of Systems  Please see the history of present illness.    (+)*** (+)***  All other systems reviewed and are otherwise negative except as noted above.  Physical Exam    Wt Readings from Last 3 Encounters:  03/01/23 113 lb 12.1 oz (51.6 kg)  02/25/23 116 lb (52.6 kg)  02/13/23 116 lb (52.6 kg)   [id number] were no vitals filed for this visit.,There is no height or weight on file to calculate BMI.  Constitutional:      Appearance: Healthy appearance. Not in distress.  Neck:     Vascular: JVD normal.  Pulmonary:     Effort: Pulmonary effort is normal.     Breath sounds: No wheezing. No rales. Diminished in the bases Cardiovascular:     Normal rate. Regular rhythm. Normal S1. Normal S2.      Murmurs: There is no murmur.  Edema:    Peripheral edema absent.  Abdominal:     Palpations: Abdomen is soft non tender. There is no hepatomegaly.  Skin:    General: Skin is warm and dry.  Neurological:     General: No focal deficit present.     Mental Status: Alert and oriented to person, place and time.     Cranial Nerves: Cranial nerves are intact.  EKG/LABS/ Recent Cardiac Studies    ECG personally reviewed by me today - ***  Risk Assessment/Calculations:   {Does this patient have ATRIAL  FIBRILLATION?:[phone]}        Lab Results  Component Value Date   WBC 4.1 03/02/2023   HGB 9.9 (L) 03/02/2023   HCT 31.0 (L) 03/02/2023   MCV 88.6 03/02/2023   PLT 121 (L) 03/02/2023   Lab Results  Component Value Date   CREATININE 1.26 (H) 03/02/2023   BUN 27 (H) 03/02/2023   NA 133 (L) 03/02/2023   K 4.0 03/02/2023   CL 103 03/02/2023   CO2 24 03/02/2023   Lab Results  Component Value Date   ALT 10 03/01/2023   AST 18  03/01/2023   ALKPHOS 71 03/01/2023   BILITOT 0.5 03/01/2023   Lab Results  Component Value Date   CHOL 127 03/01/2023   HDL 53 03/01/2023   LDLCALC 66 03/01/2023   TRIG 42 03/01/2023   CHOLHDL 2.4 03/01/2023    Lab Results  Component Value Date   HGBA1C 6.2 02/13/2023    Cardiac Studies & Procedures       ECHOCARDIOGRAM  ECHOCARDIOGRAM COMPLETE 03/01/2023  Narrative ECHOCARDIOGRAM REPORT    Patient Name:   CAISLEY SCIARRETTA Date of Exam: 03/01/2023 Medical Rec #:  [id number]       Height:       62.0 in Accession #:    [id number]      Weight:       113.8 lb Date of Birth:  [birth year]        BSA:          1.504 m Patient Age:    26 years        BP:           147/70 mmHg Patient Gender: F               HR:           51 bpm. Exam Location:  Inpatient  Procedure: 2D Echo, Cardiac Doppler and Color Doppler  Indications:    elevated troponin  History:        Patient has no prior history of Echocardiogram examinations. Stroke; Risk Factors:Hypertension and Dyslipidemia.  Sonographer:    Phineas Douglas Referring Phys: [id number] [place]   1. Left ventricular ejection fraction, by estimation, is 55 to 60%. The left ventricle has normal function. The left ventricle has no regional wall motion abnormalities. Left ventricular diastolic parameters are consistent with Grade II diastolic dysfunction (pseudonormalization). 2. Right ventricular systolic function is normal. The right ventricular size is normal. There is mildly elevated pulmonary artery systolic pressure. The estimated right ventricular systolic pressure is [id number] mmHg. 3. Left atrial size was moderately dilated. 4. The mitral valve is degenerative. Mild to moderate mitral valve regurgitation. No evidence of mitral stenosis. 5. Tricuspid valve regurgitation is moderate. 6. The aortic valve is tricuspid. There is moderate calcification of the aortic valve. Aortic valve regurgitation is trivial. No aortic  stenosis is present. 7. The inferior vena cava is dilated in size with >50% respiratory variability, suggesting right atrial pressure of 8 mmHg.  FINDINGS Left Ventricle: Left ventricular ejection fraction, by estimation, is 55 to 60%. The left ventricle has normal function. The left ventricle has no regional wall motion abnormalities. The left ventricular internal cavity size was normal in size. There is no left ventricular hypertrophy. Left ventricular diastolic parameters are consistent with Grade II diastolic dysfunction (pseudonormalization).  Right Ventricle: The right ventricular size is normal. No increase in right ventricular wall thickness. Right ventricular systolic function is normal. There is mildly  elevated pulmonary artery systolic pressure. The tricuspid regurgitant velocity is 3.03 m/s, and with an assumed right atrial pressure of 3 mmHg, the estimated right ventricular systolic pressure is [id number] mmHg.  Left Atrium: Left atrial size was moderately dilated.  Right Atrium: Right atrial size was normal in size.  Pericardium: There is no evidence of pericardial effusion.  Mitral Valve: The mitral valve is degenerative in appearance. Mild to moderate mitral valve regurgitation, with posteriorly-directed jet. No evidence of mitral valve stenosis.  Tricuspid Valve: The tricuspid valve is normal in structure. Tricuspid valve regurgitation is moderate . No evidence of tricuspid stenosis.  Aortic Valve: The aortic valve is tricuspid. There is moderate calcification of the aortic valve. Aortic valve regurgitation is trivial. Aortic regurgitation PHT measures 763 msec. No aortic stenosis is present.  Pulmonic Valve: The pulmonic valve was not well visualized. Pulmonic valve regurgitation is trivial. No evidence of pulmonic stenosis.  Aorta: The aortic root is normal in size and structure.  Venous: The inferior vena cava is dilated in size with greater than 50% respiratory variability,  suggesting right atrial pressure of 8 mmHg.  IAS/Shunts: No atrial level shunt detected by color flow Doppler.   LEFT VENTRICLE PLAX 2D LVIDd:         4.20 cm      Diastology LVIDs:         2.90 cm      LV e' medial:    4.05 cm/s LV PW:         1.30 cm      LV E/e' medial:  18.1 LV IVS:        1.40 cm      LV e' lateral:   5.43 cm/s LVOT diam:     1.90 cm      LV E/e' lateral: 13.5 LV SV:         64 LV SV Index:   42 LVOT Area:     2.84 cm  LV Volumes (MOD) LV vol d, MOD A2C: 128.0 ml LV vol d, MOD A4C: 110.0 ml LV vol s, MOD A2C: 51.8 ml LV vol s, MOD A4C: 38.1 ml LV SV MOD A2C:     76.2 ml LV SV MOD A4C:     110.0 ml LV SV MOD BP:      76.8 ml  RIGHT VENTRICLE             IVC RV Basal diam:  3.90 cm     IVC diam: 2.10 cm RV S prime:     11.10 cm/s TAPSE (M-mode): 1.7 cm  LEFT ATRIUM             Index        RIGHT ATRIUM           Index LA diam:        3.80 cm 2.53 cm/m   RA Area:     12.70 cm LA Vol (A2C):   62.6 ml 41.62 ml/m  RA Volume:   26.30 ml  17.49 ml/m LA Vol (A4C):   81.0 ml 53.85 ml/m LA Biplane Vol: 75.6 ml 50.26 ml/m AORTIC VALVE LVOT Vmax:   96.00 cm/s LVOT Vmean:  60.100 cm/s LVOT VTI:    0.225 m AI PHT:      763 msec  AORTA Ao Root diam: 3.00 cm Ao Asc diam:  2.70 cm  MITRAL VALVE                  TRICUSPID  VALVE MV Area (PHT): 3.03 cm       TR Peak grad:   36.7 mmHg MV Decel Time: 250 msec       TR Vmax:        303.00 cm/s MR Peak grad:    159.3 mmHg MR Mean grad:    99.0 mmHg    SHUNTS MR Vmax:         631.00 cm/s  Systemic VTI:  0.22 m MR Vmean:        469.0 cm/s   Systemic Diam: 1.90 cm MR PISA:         1.57 cm MR PISA Eff ROA: 8 mm MR PISA Radius:  0.50 cm MV E velocity: 73.20 cm/s MV A velocity: 51.90 cm/s MV E/A ratio:  1.41  Glori Bickers MD Electronically signed by Glori Bickers MD Signature Date/Time: 03/01/2023/9:39:08 AM    Final             Assessment & Plan    1.  Atypical chest pain: -Patient was  seen in the ED for complaint of shoulder pain and chest discomfort that was related to rotator cuff tear. -Today patient reports***  2.  Exertional fatigue: -Today patient reports***  3.  Essential hypertension: -Patient's blood pressure today is***  4.  Coronary artery calcification: -Most recent CT of the chest revealed aortic calcification and an incidental finding of hyperdense lesion on left kidney  5.  DM type II: -Patient's last hemoglobin A1c was***  6.  CKD stage III: -Patient is currently followed by*** -Most recent creatinine was***      Disposition: Follow-up with None or APP in *** months {Are you ordering a CV Procedure (e.g. stress test, cath, DCCV, TEE, etc)?   Press F2        :[id number]   Medication Adjustments/Labs and Tests Ordered: Current medicines are reviewed at length with the patient today.  Concerns regarding medicines are outlined above.   Signed, Mable Fill, Marissa Nestle, NP 03/11/2023, 3:42 PM [facility]  Note:  This document was prepared using Dragon voice recognition software and may include unintentional dictation errors.

## 2023-03-12 ENCOUNTER — Ambulatory Visit: Payer: Medicare PPO | Admitting: Physician Assistant

## 2023-03-13 ENCOUNTER — Encounter: Payer: Self-pay | Admitting: Nurse Practitioner

## 2023-03-13 ENCOUNTER — Ambulatory Visit: Payer: Medicare PPO | Attending: Physician Assistant | Admitting: Nurse Practitioner

## 2023-03-13 VITALS — BP 146/72 | HR 55 | Ht 62.0 in | Wt 119.4 lb

## 2023-03-13 DIAGNOSIS — I2510 Atherosclerotic heart disease of native coronary artery without angina pectoris: Secondary | ICD-10-CM | POA: Diagnosis not present

## 2023-03-13 DIAGNOSIS — I10 Essential (primary) hypertension: Secondary | ICD-10-CM | POA: Diagnosis not present

## 2023-03-13 DIAGNOSIS — R0789 Other chest pain: Secondary | ICD-10-CM | POA: Diagnosis not present

## 2023-03-13 DIAGNOSIS — E1121 Type 2 diabetes mellitus with diabetic nephropathy: Secondary | ICD-10-CM | POA: Diagnosis not present

## 2023-03-13 DIAGNOSIS — R072 Precordial pain: Secondary | ICD-10-CM

## 2023-03-13 DIAGNOSIS — I2584 Coronary atherosclerosis due to calcified coronary lesion: Secondary | ICD-10-CM | POA: Diagnosis not present

## 2023-03-13 DIAGNOSIS — N1832 Chronic kidney disease, stage 3b: Secondary | ICD-10-CM | POA: Diagnosis not present

## 2023-03-13 DIAGNOSIS — R5383 Other fatigue: Secondary | ICD-10-CM | POA: Diagnosis not present

## 2023-03-13 NOTE — Patient Instructions (Addendum)
Medication Instructions:  Your physician recommends that you continue on your current medications as directed. Please refer to the Current Medication list given to you today. *If you need a refill on your cardiac medications before your next appointment, please call your pharmacy*   Lab Work: NONE ORDERED If you have labs (blood work) drawn today and your tests are completely normal, you will receive your results only by: [facility] (if you have MyChart) OR A paper copy in the mail If you have any lab test that is abnormal or we need to change your treatment, we will call you to review the results.   Testing/Procedures: Your physician has requested that you have a lexiscan myoview. For further information please visit [email or web address]. Please follow instruction sheet, as given.   Follow-Up: At [facility], you and your health needs are our priority.  As part of our continuing mission to provide you with exceptional heart care, we have created designated Provider Care Teams.  These Care Teams include your primary Cardiologist (physician) and Advanced Practice Providers (APPs -  Physician Assistants and Nurse Practitioners) who all work together to provide you with the care you need, when you need it.  We recommend signing up for the patient portal called "MyChart".  Sign up information is provided on this After Visit Summary.  MyChart is used to connect with patients for Virtual Visits (Telemedicine).  Patients are able to view lab/test results, encounter notes, upcoming appointments, etc.  Non-urgent messages can be sent to your provider as well.   To learn more about what you can do with MyChart, go to [email or web address].    Your next appointment:   3 month(s)  Provider:   Ambrose Pancoast, NP       Other Instructions Check your blood pressure daily for 2 weeks, then contact the office with your readings.  YOUR BLOOD PRESSURE GOAL IS 130-140's FOR THE TOP NUMBER AND  70-80's FOR THE BOTTOM NUMBER  DRINK 64 oz OF FLUID DAILY

## 2023-03-14 DIAGNOSIS — I700 Atherosclerosis of aorta: Secondary | ICD-10-CM | POA: Diagnosis not present

## 2023-03-14 DIAGNOSIS — I2510 Atherosclerotic heart disease of native coronary artery without angina pectoris: Secondary | ICD-10-CM | POA: Diagnosis not present

## 2023-03-14 DIAGNOSIS — M545 Low back pain, unspecified: Secondary | ICD-10-CM | POA: Diagnosis not present

## 2023-03-14 DIAGNOSIS — M25512 Pain in left shoulder: Secondary | ICD-10-CM | POA: Diagnosis not present

## 2023-03-14 DIAGNOSIS — G8929 Other chronic pain: Secondary | ICD-10-CM | POA: Diagnosis not present

## 2023-03-14 DIAGNOSIS — I129 Hypertensive chronic kidney disease with stage 1 through stage 4 chronic kidney disease, or unspecified chronic kidney disease: Secondary | ICD-10-CM | POA: Diagnosis not present

## 2023-03-14 DIAGNOSIS — N189 Chronic kidney disease, unspecified: Secondary | ICD-10-CM | POA: Diagnosis not present

## 2023-03-14 DIAGNOSIS — E1122 Type 2 diabetes mellitus with diabetic chronic kidney disease: Secondary | ICD-10-CM | POA: Diagnosis not present

## 2023-03-14 DIAGNOSIS — E1151 Type 2 diabetes mellitus with diabetic peripheral angiopathy without gangrene: Secondary | ICD-10-CM | POA: Diagnosis not present

## 2023-03-19 ENCOUNTER — Ambulatory Visit: Payer: Medicare PPO | Admitting: Internal Medicine

## 2023-03-20 ENCOUNTER — Ambulatory Visit (HOSPITAL_BASED_OUTPATIENT_CLINIC_OR_DEPARTMENT_OTHER): Payer: Medicare PPO | Admitting: Orthopaedic Surgery

## 2023-03-20 ENCOUNTER — Encounter (HOSPITAL_COMMUNITY): Payer: Medicare PPO

## 2023-03-20 DIAGNOSIS — S46012A Strain of muscle(s) and tendon(s) of the rotator cuff of left shoulder, initial encounter: Secondary | ICD-10-CM | POA: Diagnosis not present

## 2023-03-20 NOTE — Telephone Encounter (Signed)
Spoke with patient and her son who will be bringing her for her STRESS TEST on 03/22/23. They were asked to arrive 15 minutes early and given detailed instructions.

## 2023-03-20 NOTE — Progress Notes (Signed)
Chief Complaint: Left shoulder pain     History of Present Illness:    Breanna [name] is a 89 y.o. female presents today with ongoing left shoulder pain that has been worse over the last several months.  MRI was performed in the hospital and did show a full-thickness rotator cuff tear.  At this time she is having somewhat of a difficult time elevating the arm with pain.  She is here today for further assessment.  She has been using Voltaren gel which helps.  She has having a difficult time laying on the side.  She is having a difficult time doing grooming or reaching the arm overhead.  She is here today with her son.    Surgical History:   None  PMH/PSH/Family History/Social History/Meds/Allergies:    Past Medical History:  Diagnosis Date   Adrenal adenoma 2006   Boils 2009   Cholelithiasis    asympt. w/normal HIDA 03/2010   CVA (cerebral infarction) 2010   Cerebellar   Diverticulitis    Esophageal spasm 2011   GERD (gastroesophageal reflux disease)    Gout    History of colon polyps    HTN (hypertension)    Hydronephrosis    LEFT/ Surgical intervention   Hyperlipidemia    LBP (low back pain)    Osteoarthritis    Pulmonary HTN (HCC)    Stress    Type II or unspecified type diabetes mellitus without mention of complication, not stated as uncontrolled    Past Surgical History:  Procedure Laterality Date   ABDOMINAL HYSTERECTOMY     APPENDECTOMY     2020   BACK SURGERY     BREAST BIOPSY     RIGHT   CATARACT EXTRACTION, BILATERAL     COLECTOMY  2006   Sigmoid   FOOT SURGERY     BILATERAL   HAMMER TOE SURGERY     INTRAMEDULLARY (IM) NAIL INTERTROCHANTERIC Right 07/21/2020   Procedure: INTRAMEDULLARY (IM) NAIL INTERTROCHANTRIC;  Surgeon: Renette Butters, MD;  Location: [facility];  Service: Orthopedics;  Laterality: Right;   ROTATOR CUFF REPAIR  2008   RIGHT   TOTAL KNEE ARTHROPLASTY  2003   LEFT   VARICOSE VEIN SURGERY     vein  stripping/lower extremities   Social History   Socioeconomic History   Marital status: Widowed    Spouse name: Alithea Avila   Number of children: 1   Years of education: Not on file   Highest education level: Not on file  Occupational History   Occupation: Retired    Fish farm manager: RETIRED  Tobacco Use   Smoking status: Never   Smokeless tobacco: Never  Vaping Use   Vaping Use: Never used  Substance and Sexual Activity   Alcohol use: No    Alcohol/week: 0.0 standard drinks of alcohol   Drug use: No   Sexual activity: Not Currently  Other Topics Concern   Not on file  Social History Narrative   Not on file   Social Determinants of Health   Financial Resource Strain: Low Risk  (11/05/2022)   Overall Financial Resource Strain (CARDIA)    Difficulty of Paying Living Expenses: Not hard at all  Food Insecurity: No Food Insecurity (02/28/2023)   Hunger Vital Sign    Worried About Running Out of Food in the  Last Year: Never true    [place] in the Last Year: Never true  Transportation Needs: No Transportation Needs (02/28/2023)   PRAPARE - Hydrologist (Medical): No    Lack of Transportation (Non-Medical): No  Physical Activity: Inactive (11/05/2022)   Exercise Vital Sign    Days of Exercise per Week: 0 days    Minutes of Exercise per Session: 0 min  Stress: No Stress Concern Present (11/05/2022)   [place]    Feeling of Stress : Not at all  Social Connections: Moderately Isolated (11/05/2022)   Social Connection and Isolation Panel [NHANES]    Frequency of Communication with Friends and Family: Twice a week    Frequency of Social Gatherings with Friends and Family: Twice a week    Attends Religious Services: 1 to 4 times per year    Active Member of [employer] or Organizations: No    Attends Archivist Meetings: Never    Marital Status: Widowed   Family History   Problem Relation Age of Onset   Prostate cancer Father    Hypertension Father    Cancer Father        prostate   Heart disease Mother    Diabetes Mother    Diabetes Other        1st degree relative   Heart disease Other    Hypertension Other    Prostate cancer Maternal Uncle    Cancer Maternal Uncle        prostate   Breast cancer Daughter    Cancer Daughter        breast   Colon cancer Neg Hx    Allergies  Allergen Reactions   Verapamil Shortness Of Breath    REACTION: SOB   Aspirin Itching    But tolerates low dose   Atenolol     REACTION: fatigue   Codeine Itching   Codeine Sulfate    Hydrochlorothiazide     REACTION: gout   Hydrocodone       side effects - hallucinations   Ibuprofen     Upset stomach w/high doses   Lisinopril     REACTION: cough   Onion Other (See Comments)    Dry mouth/ gets sores   Shellfish Allergy Swelling    Patient stated she does not eat shellfish, she swells up on different parts of the body   Valsartan Itching   Adhesive  [Tape] Rash   Other Rash   Current Outpatient Medications  Medication Sig Dispense Refill   acetaminophen (TYLENOL) 325 MG tablet Take 2 tablets (650 mg total) by mouth every 6 (six) hours as needed for moderate pain or fever.     Alcohol Swabs (TRUE COMFORT ALCOHOL PREP PADS) 70 % PADS Use as directed 300 each 1   allopurinol (ZYLOPRIM) 100 MG tablet Take 1 tablet (100 mg total) by mouth daily. 90 tablet 3   amLODipine (NORVASC) 5 MG tablet Take 1 tablet by mouth daily.     ascorbic acid (VITAMIN C) 500 MG tablet TAKE 1 TABLET BY MOUTH EVERY DAY 30 tablet 11   ASPIRIN LOW DOSE 81 MG tablet TAKE 1 TABLET BY MOUTH DAILY. SWALLOW WHOLE. 90 tablet 3   atropine 1 % ophthalmic solution Place 1 drop into both eyes daily.     Blood Glucose Calibration (TRUE METRIX LEVEL 1) Low SOLN USE AS DIRECTED 3 each 3   brimonidine (ALPHAGAN)  0.2 % ophthalmic solution Place 1 drop into the right eye 2 (two) times daily.      calcium carbonate (OSCAL) 1500 (600 Ca) MG TABS tablet TAKE 1 TABLET BY MOUTH EVERY DAY 30 tablet 11   Cholecalciferol (VITAMIN D3) 25 MCG (1000 UT) CAPS Take 1 capsule (1,000 Units total) by mouth daily. 90 capsule 3   diclofenac sodium (VOLTAREN) 1 % GEL Apply 2 g topically 3 (three) times daily. To left shoulder 200 g 3   docusate sodium (COLACE) 100 MG capsule TAKE 1 CAPSULE BY MOUTH TWICE A DAY 180 capsule 1   dorzolamide-timolol (COSOPT) 22.3-6.8 MG/ML ophthalmic solution Place 1 drop into both eyes 2 (two) times daily.      feeding supplement, ENSURE ENLIVE, (ENSURE ENLIVE) LIQD Take 237 mLs by mouth daily at 2 PM. 237 mL 12   gabapentin (NEURONTIN) 300 MG capsule Gabapentin 300 mg - take one with breakfast, one with supper and one at 3 am if woke up with pain 90 capsule 5   glucose blood (GNP TRUE METRIX GLUCOSE STRIPS) test strip Use to check blood sugars twice a day 200 each 2   latanoprost (XALATAN) 0.005 % ophthalmic solution Place 1 drop into both eyes at bedtime.      lidocaine (LIDODERM) 5 % Place 1 patch onto the skin daily. Remove & Discard patch within 12 hours or as directed by MD. Apply to left shoulder 30 patch 0   lidocaine-prilocaine (EMLA) cream Apply 1 application topically as needed. 30 g 0   losartan (COZAAR) 100 MG tablet Take 1 tablet (100 mg total) by mouth daily. 30 tablet 3   metFORMIN (GLUCOPHAGE) 500 MG tablet Take 1 tablet (500 mg total) by mouth daily with breakfast. Must keep scheduled appt for future refills 90 tablet 1   NON FORMULARY Place 10 drops under the tongue daily as needed (Pain). Medication: CBD oil     pantoprazole (PROTONIX) 40 MG tablet TAKE 1 TABLET BY MOUTH EVERY DAY 90 tablet 3   polyethylene glycol (MIRALAX / GLYCOLAX) 17 g packet Take 17 g by mouth daily as needed for mild constipation. 14 each 0   senna (SENOKOT) 8.6 MG TABS tablet Take 1 tablet (8.6 mg total) by mouth 2 (two) times daily. 120 tablet 0   sertraline (ZOLOFT) 100 MG tablet TAKE  1 TABLET BY MOUTH EVERY DAY 90 tablet 1   spironolactone (ALDACTONE) 25 MG tablet TAKE 1 TAB BY MOUTH MON - WED - FRI 90 tablet 1   traMADol (ULTRAM) 50 MG tablet Take 1 tablet (50 mg total) by mouth every 8 (eight) hours as needed for severe pain. 30 tablet 0   TRUEplus Lancets 33G MISC USE AS DIRECTED TO CHECK BLOOD SUGAR TWICE A DAY 200 each 1   valACYclovir (VALTREX) 500 MG tablet Take 1 tablet (500 mg total) by mouth daily as needed (for outbreaks).     vitamin B-12 (CYANOCOBALAMIN) 1000 MCG tablet TAKE 1 TABLET (1,000 MCG TOTAL) BY MOUTH EVERY DAY 90 tablet 3   No current facility-administered medications for this visit.   No results found.  Review of Systems:   A ROS was performed including pertinent positives and negatives as documented in the HPI.  Physical Exam :   Constitutional: NAD and appears stated age Neurological: Alert and oriented Psych: Appropriate affect and cooperative There were no vitals taken for this visit.   Comprehensive Musculoskeletal Exam:    Musculoskeletal Exam    Inspection Right Left  Skin  No atrophy or winging No atrophy or winging  Palpation    Tenderness None Lateral deltoid  Range of Motion    Flexion (passive) 170 160  Flexion (active) 170 110  Abduction 170 110  ER at the side 70 40  Can reach behind back to T12 L5  Strength     5/5 4/5  Special Tests    Pseudoparalytic No No  Neurologic    Fires PIN, radial, median, ulnar, musculocutaneous, axillary, suprascapular, long thoracic, and spinal accessory innervated muscles. No abnormal sensibility  Vascular/Lymphatic    Radial Pulse 2+ 2+  Cervical Exam    Patient has symmetric cervical range of motion with negative Spurling's test.  Special Test: Positive Neer impingement     Imaging:   Xray (left shoulder): Mild decrease in the acromiohumeral interval  MRI (left shoulder): Full-thickness tear of the supraspinatus tendon  I personally reviewed and interpreted the  radiographs.   Assessment:   89 y.o. female with a full-thickness supraspinatus tendon tear which has been ongoing for the last several months which is symptomatic.  Overall I did describe that I would recommend continued use of Voltaren gel.  She would like to defer any type of injections.  At this time she is working with physical therapy on outpatient basis.  I do believe this is very good initial step.  I could provide additional Voltaren gel as needed.  I will plan to see her back in 2 months for reassessment  Plan :    -Return to clinic in 2 months for reassessment     I personally saw and evaluated the patient, and participated in the management and treatment plan.  Vanetta Mulders, MD Attending Physician, Orthopedic Surgery  This document was dictated using Dragon voice recognition software. A reasonable attempt at proof reading has been made to minimize errors.

## 2023-03-21 DIAGNOSIS — E1122 Type 2 diabetes mellitus with diabetic chronic kidney disease: Secondary | ICD-10-CM | POA: Diagnosis not present

## 2023-03-21 DIAGNOSIS — I129 Hypertensive chronic kidney disease with stage 1 through stage 4 chronic kidney disease, or unspecified chronic kidney disease: Secondary | ICD-10-CM | POA: Diagnosis not present

## 2023-03-21 DIAGNOSIS — E1151 Type 2 diabetes mellitus with diabetic peripheral angiopathy without gangrene: Secondary | ICD-10-CM | POA: Diagnosis not present

## 2023-03-21 DIAGNOSIS — I700 Atherosclerosis of aorta: Secondary | ICD-10-CM | POA: Diagnosis not present

## 2023-03-21 DIAGNOSIS — N189 Chronic kidney disease, unspecified: Secondary | ICD-10-CM | POA: Diagnosis not present

## 2023-03-21 DIAGNOSIS — M545 Low back pain, unspecified: Secondary | ICD-10-CM | POA: Diagnosis not present

## 2023-03-21 DIAGNOSIS — G8929 Other chronic pain: Secondary | ICD-10-CM | POA: Diagnosis not present

## 2023-03-21 DIAGNOSIS — I2510 Atherosclerotic heart disease of native coronary artery without angina pectoris: Secondary | ICD-10-CM | POA: Diagnosis not present

## 2023-03-21 DIAGNOSIS — M25512 Pain in left shoulder: Secondary | ICD-10-CM | POA: Diagnosis not present

## 2023-03-22 ENCOUNTER — Ambulatory Visit (HOSPITAL_COMMUNITY): Payer: Medicare PPO

## 2023-03-22 ENCOUNTER — Telehealth (HOSPITAL_COMMUNITY): Payer: Self-pay | Admitting: *Deleted

## 2023-03-22 ENCOUNTER — Encounter (HOSPITAL_COMMUNITY): Payer: Self-pay | Admitting: *Deleted

## 2023-03-22 NOTE — Telephone Encounter (Signed)
Left message on voicemail per DPR in reference to upcoming appointment scheduled on 03/27/2023 at 10:30 with detailed instructions given per Myocardial Perfusion Study Information Sheet for the test. LM to arrive 15 minutes early, and that it is imperative to arrive on time for appointment to keep from having the test rescheduled. If you need to cancel or reschedule your appointment, please call the office within 24 hours of your appointment. Failure to do so may result in a cancellation of your appointment, and a $50 no show fee. Phone number given for call back for any questions.

## 2023-03-25 ENCOUNTER — Other Ambulatory Visit: Payer: Self-pay | Admitting: Internal Medicine

## 2023-03-27 ENCOUNTER — Ambulatory Visit (HOSPITAL_COMMUNITY): Payer: Medicare PPO | Attending: Cardiology

## 2023-03-27 DIAGNOSIS — I2510 Atherosclerotic heart disease of native coronary artery without angina pectoris: Secondary | ICD-10-CM

## 2023-03-27 DIAGNOSIS — R5383 Other fatigue: Secondary | ICD-10-CM | POA: Insufficient documentation

## 2023-03-27 DIAGNOSIS — R072 Precordial pain: Secondary | ICD-10-CM | POA: Diagnosis not present

## 2023-03-27 DIAGNOSIS — I2584 Coronary atherosclerosis due to calcified coronary lesion: Secondary | ICD-10-CM | POA: Insufficient documentation

## 2023-03-27 DIAGNOSIS — R0789 Other chest pain: Secondary | ICD-10-CM

## 2023-03-27 DIAGNOSIS — I10 Essential (primary) hypertension: Secondary | ICD-10-CM | POA: Diagnosis not present

## 2023-03-27 DIAGNOSIS — E1121 Type 2 diabetes mellitus with diabetic nephropathy: Secondary | ICD-10-CM | POA: Diagnosis not present

## 2023-03-27 DIAGNOSIS — N1832 Chronic kidney disease, stage 3b: Secondary | ICD-10-CM

## 2023-03-27 LAB — MYOCARDIAL PERFUSION IMAGING
Estimated workload: 1
Exercise duration (min): 1 min
Exercise duration (sec): 0 s
LV dias vol: 90 mL (ref 46–106)
LV sys vol: 38 mL
MPHR: 131 {beats}/min
Nuc Stress EF: 58 %
Peak HR: 66 {beats}/min
Percent HR: 50 %
Rest HR: 53 {beats}/min
Rest Nuclear Isotope Dose: 10.6 mCi
SDS: 1
SRS: 2
SSS: 3
ST Depression (mm): 0 mm
Stress Nuclear Isotope Dose: 31.1 mCi
TID: 0.93

## 2023-03-27 MED ORDER — TECHNETIUM TC 99M TETROFOSMIN IV KIT
10.6000 | PACK | Freq: Once | INTRAVENOUS | Status: AC | PRN
  Administered 2023-03-27: 10.6 via INTRAVENOUS

## 2023-03-27 MED ORDER — REGADENOSON 0.4 MG/5ML IV SOLN: 0.4000 mg | Freq: Once | INTRAVENOUS | Status: DC

## 2023-03-27 MED ORDER — TECHNETIUM TC 99M TETROFOSMIN IV KIT: 31.1000 | PACK | Freq: Once | INTRAVENOUS | Status: DC | PRN

## 2023-03-28 ENCOUNTER — Other Ambulatory Visit: Payer: Self-pay

## 2023-03-28 DIAGNOSIS — I2510 Atherosclerotic heart disease of native coronary artery without angina pectoris: Secondary | ICD-10-CM

## 2023-03-28 DIAGNOSIS — E785 Hyperlipidemia, unspecified: Secondary | ICD-10-CM

## 2023-03-28 MED ORDER — ROSUVASTATIN CALCIUM 5 MG PO TABS: 5.0000 mg | ORAL_TABLET | Freq: Every day | ORAL | 3 refills | Status: DC

## 2023-04-01 DIAGNOSIS — M25512 Pain in left shoulder: Secondary | ICD-10-CM | POA: Diagnosis not present

## 2023-04-01 DIAGNOSIS — E1122 Type 2 diabetes mellitus with diabetic chronic kidney disease: Secondary | ICD-10-CM | POA: Diagnosis not present

## 2023-04-01 DIAGNOSIS — G8929 Other chronic pain: Secondary | ICD-10-CM | POA: Diagnosis not present

## 2023-04-01 DIAGNOSIS — I2510 Atherosclerotic heart disease of native coronary artery without angina pectoris: Secondary | ICD-10-CM | POA: Diagnosis not present

## 2023-04-01 DIAGNOSIS — N189 Chronic kidney disease, unspecified: Secondary | ICD-10-CM | POA: Diagnosis not present

## 2023-04-01 DIAGNOSIS — I700 Atherosclerosis of aorta: Secondary | ICD-10-CM | POA: Diagnosis not present

## 2023-04-01 DIAGNOSIS — M545 Low back pain, unspecified: Secondary | ICD-10-CM | POA: Diagnosis not present

## 2023-04-01 DIAGNOSIS — E1151 Type 2 diabetes mellitus with diabetic peripheral angiopathy without gangrene: Secondary | ICD-10-CM | POA: Diagnosis not present

## 2023-04-01 DIAGNOSIS — I129 Hypertensive chronic kidney disease with stage 1 through stage 4 chronic kidney disease, or unspecified chronic kidney disease: Secondary | ICD-10-CM | POA: Diagnosis not present

## 2023-04-03 ENCOUNTER — Encounter: Payer: Self-pay | Admitting: Family

## 2023-04-03 ENCOUNTER — Ambulatory Visit (INDEPENDENT_AMBULATORY_CARE_PROVIDER_SITE_OTHER): Payer: Medicare PPO | Admitting: Family

## 2023-04-03 VITALS — BP 170/76 | HR 47 | Ht 62.0 in | Wt 119.0 lb

## 2023-04-03 DIAGNOSIS — I10 Essential (primary) hypertension: Secondary | ICD-10-CM | POA: Diagnosis not present

## 2023-04-03 DIAGNOSIS — R42 Dizziness and giddiness: Secondary | ICD-10-CM

## 2023-04-03 NOTE — Assessment & Plan Note (Addendum)
taking Losartan 100mg  qd & Amlodipine 5mg  qd, Spironolactone 25mg  qd BP high today w/dizziness pt w/son brought in BP readings from home, mostly 150-160/80-90, a few 170 systolic pt takes all pills in am, just Gabapentin in pm advised pt to cut Losartan in half & take 1/2 bid take extra Amlodipine just today drink up to 2L water qd, caution when standing from sitting Keep f/u w/ PCP in 8days

## 2023-04-03 NOTE — Patient Instructions (Signed)
It was very nice to see you today!   I recommend you cut the Losartan pill in half and take 1/2 pill twice a day. Take an extra Amlodipine pill today as your BP is high. Ok to take the Crestor (cholesterol pill) every other day until you see Dr. Posey Rea and discuss whether to continue this or not. Be sure you are drinking plenty of fluids daily. Be sure you take your time when you stand up from sitting to allow your blood pressure to regulate.  Be sure to discuss all the above and your elevated blood pressure with Dr. Posey Rea at your upcoming appointment.      PLEASE NOTE:  If you had any lab tests please let us know if you have not heard back within a few days. You may see your results on MyChart before we have a chance to review them but we will give you a call once they are reviewed by [place]. If we ordered any referrals today, please let us know if you have not heard from their office within the next week.

## 2023-04-03 NOTE — Progress Notes (Signed)
Patient ID: Breanna [name], female    DOB: 30-Mar-1931, 89 y.o.   MRN: [id number]  Chief Complaint  Patient presents with  . Dizziness    Pt c/o dizziness and lightheadedness, fell this morning getting dressed. Started today.   . Hypertension   HPI:      Dizziness:   pt reports feeling "whoozy" when she woke up this am, and she fell while putting on her depends. Her BP at home has been high & HR in 40s which is normal for her. Per EMR, pt hospitalized early March for chest pain and has had f/u with Cardiology. ER stopped Coreg, & increased Losartan, also taking Amlodipine. Cards ordered stress test on 4/3 and started pt on low dose Crestor, last lipids wnl.  Pt also has DM2, CKD-3, HLD, cerebellar CVA. 2D echo done 3/8 & EF 55-60.    Assessment & Plan:  Dizziness  Essential hypertension Assessment & Plan: taking Losartan 100mg  qd & Amlodipine 5mg  qd, Spironolactone 25mg  qd BP high today w/dizziness pt w/son brought in BP readings from home, mostly 150-160/80-90, a few 170 systolic pt takes all pills in am, just Gabapentin in pm advised pt to cut Losartan in half & take 1/2 bid take extra Amlodipine just today drink up to 2L water qd, caution when standing from sitting Keep f/u w/ PCP in 8days        Subjective:    Outpatient Medications Prior to Visit  Medication Sig Dispense Refill  . acetaminophen (TYLENOL) 325 MG tablet Take 2 tablets (650 mg total) by mouth every 6 (six) hours as needed for moderate pain or fever.    . Alcohol Swabs (TRUE COMFORT ALCOHOL PREP PADS) 70 % PADS Use as directed 300 each 1  . allopurinol (ZYLOPRIM) 100 MG tablet TAKE 1 TABLET BY MOUTH EVERY DAY 90 tablet 1  . amLODipine (NORVASC) 5 MG tablet Take 1 tablet by mouth daily.    Marland Kitchen ascorbic acid (VITAMIN C) 500 MG tablet TAKE 1 TABLET BY MOUTH EVERY DAY 30 tablet 11  . ASPIRIN LOW DOSE 81 MG tablet TAKE 1 TABLET BY MOUTH DAILY. SWALLOW WHOLE. 90 tablet 3  . atropine 1 % ophthalmic solution Place 1  drop into both eyes daily.    . Blood Glucose Calibration (TRUE METRIX LEVEL 1) Low SOLN USE AS DIRECTED 3 each 3  . brimonidine (ALPHAGAN) 0.2 % ophthalmic solution Place 1 drop into the right eye 2 (two) times daily.    . calcium carbonate (OSCAL) 1500 (600 Ca) MG TABS tablet TAKE 1 TABLET BY MOUTH EVERY DAY 90 tablet 3  . Cholecalciferol (VITAMIN D3) 25 MCG (1000 UT) CAPS Take 1 capsule (1,000 Units total) by mouth daily. 90 capsule 3  . diclofenac sodium (VOLTAREN) 1 % GEL Apply 2 g topically 3 (three) times daily. To left shoulder 200 g 3  . docusate sodium (COLACE) 100 MG capsule TAKE 1 CAPSULE BY MOUTH TWICE A DAY 180 capsule 3  . dorzolamide-timolol (COSOPT) 22.3-6.8 MG/ML ophthalmic solution Place 1 drop into both eyes 2 (two) times daily.     . feeding supplement, ENSURE ENLIVE, (ENSURE ENLIVE) LIQD Take 237 mLs by mouth daily at 2 PM. 237 mL 12  . gabapentin (NEURONTIN) 100 MG capsule TAKE 1 CAPSULE BY MOUTH THREE TIMES A DAY 90 capsule 5  . gabapentin (NEURONTIN) 300 MG capsule Gabapentin 300 mg - take one with breakfast, one with supper and one at 3 am if woke up with pain 90  capsule 5  . glucose blood (GNP TRUE METRIX GLUCOSE STRIPS) test strip Use to check blood sugars twice a day 200 each 2  . latanoprost (XALATAN) 0.005 % ophthalmic solution Place 1 drop into both eyes at bedtime.     . lidocaine (LIDODERM) 5 % Place 1 patch onto the skin daily. Remove & Discard patch within 12 hours or as directed by MD. Apply to left shoulder 30 patch 0  . lidocaine-prilocaine (EMLA) cream Apply 1 application topically as needed. 30 g 0  . losartan (COZAAR) 100 MG tablet Take 1 tablet (100 mg total) by mouth daily. 30 tablet 3  . metFORMIN (GLUCOPHAGE) 500 MG tablet Take 1 tablet (500 mg total) by mouth daily with breakfast. 90 tablet 1  . NON FORMULARY Place 10 drops under the tongue daily as needed (Pain). Medication: CBD oil    . pantoprazole (PROTONIX) 40 MG tablet TAKE 1 TABLET BY MOUTH  EVERY DAY 90 tablet 3  . polyethylene glycol (MIRALAX / GLYCOLAX) 17 g packet Take 17 g by mouth daily as needed for mild constipation. 14 each 0  . rosuvastatin (CRESTOR) 5 MG tablet Take 1 tablet (5 mg total) by mouth daily. 90 tablet 3  . senna (SENOKOT) 8.6 MG TABS tablet Take 1 tablet (8.6 mg total) by mouth 2 (two) times daily. 120 tablet 0  . sertraline (ZOLOFT) 100 MG tablet TAKE 1 TABLET BY MOUTH EVERY DAY 90 tablet 1  . spironolactone (ALDACTONE) 25 MG tablet TAKE 1 TAB BY MOUTH MON - WED - FRI 90 tablet 1  . traMADol (ULTRAM) 50 MG tablet Take 1 tablet (50 mg total) by mouth every 8 (eight) hours as needed for severe pain. 30 tablet 0  . TRUEplus Lancets 33G MISC USE AS DIRECTED TO CHECK BLOOD SUGAR TWICE A DAY 200 each 1  . valACYclovir (VALTREX) 500 MG tablet Take 1 tablet (500 mg total) by mouth daily as needed (for outbreaks).    . vitamin B-12 (CYANOCOBALAMIN) 1000 MCG tablet TAKE 1 TABLET (1,000 MCG TOTAL) BY MOUTH EVERY DAY 90 tablet 3   Facility-Administered Medications Prior to Visit  Medication Dose Route Frequency Provider Last Rate Last Admin  . regadenoson (LEXISCAN) injection SOLN 0.4 mg  0.4 mg Intravenous Once Pemberton, Herbert SetaHeather E, MD      . technetium tetrofosmin (TC-MYOVIEW) injection 31.1 millicurie  31.1 millicurie Intravenous Once PRN Meriam SpraguePemberton, Heather E, MD       Past Medical History:  Diagnosis Date  . Adrenal adenoma 2006  . Boils 2009  . Cholelithiasis    asympt. w/normal HIDA 03/2010  . CVA (cerebral infarction) 2010   Cerebellar  . Diverticulitis   . Esophageal spasm 2011  . GERD (gastroesophageal reflux disease)   . Gout   . History of colon polyps   . HTN (hypertension)   . Hydronephrosis    LEFT/ Surgical intervention  . Hyperlipidemia   . LBP (low back pain)   . Osteoarthritis   . Pulmonary HTN   . Stress   . Type II or unspecified type diabetes mellitus without mention of complication, not stated as uncontrolled    Past Surgical  History:  Procedure Laterality Date  . ABDOMINAL HYSTERECTOMY    . APPENDECTOMY     2020  . BACK SURGERY    . BREAST BIOPSY     RIGHT  . CATARACT EXTRACTION, BILATERAL    . COLECTOMY  2006   Sigmoid  . FOOT SURGERY     BILATERAL  .  HAMMER TOE SURGERY    . INTRAMEDULLARY (IM) NAIL INTERTROCHANTERIC Right 07/21/2020   Procedure: INTRAMEDULLARY (IM) NAIL INTERTROCHANTRIC;  Surgeon: Sheral Apley, MD;  Location: [facility];  Service: Orthopedics;  Laterality: Right;  . ROTATOR CUFF REPAIR  2008   RIGHT  . TOTAL KNEE ARTHROPLASTY  2003   LEFT  . VARICOSE VEIN SURGERY     vein stripping/lower extremities   Allergies  Allergen Reactions  . Verapamil Shortness Of Breath    REACTION: SOB  . Aspirin Itching    But tolerates low dose  . Atenolol     REACTION: fatigue  . Codeine Itching  . Codeine Sulfate   . Hydrochlorothiazide     REACTION: gout  . Hydrocodone       side effects - hallucinations  . Ibuprofen     Upset stomach w/high doses  . Lisinopril     REACTION: cough  . Onion Other (See Comments)    Dry mouth/ gets sores  . Shellfish Allergy Swelling    Patient stated she does not eat shellfish, she swells up on different parts of the body  . Valsartan Itching  . Adhesive  [Tape] Rash  . Other Rash      Objective:    Physical Exam Vitals and nursing note reviewed.  Constitutional:      Appearance: Normal appearance.  Cardiovascular:     Rate and Rhythm: Normal rate and regular rhythm.  Pulmonary:     Effort: Pulmonary effort is normal.     Breath sounds: Normal breath sounds.  Musculoskeletal:        General: Normal range of motion.  Skin:    General: Skin is warm and dry.  Neurological:     Mental Status: She is alert.  Psychiatric:        Mood and Affect: Mood normal.        Behavior: Behavior normal.   BP (!) 170/76 (BP Location: Left Arm, Patient Position: Sitting, Cuff Size: Normal)   Pulse (!) 47   Ht 5\' 2"  (1.575 m)   Wt 119 lb (54 kg)  Comment: pt is in a wheelchair  SpO2 99%   BMI 21.77 kg/m  Wt Readings from Last 3 Encounters:  04/03/23 119 lb (54 kg)  03/27/23 119 lb (54 kg)  03/13/23 119 lb 6.4 oz (54.2 kg)       Dulce Sellar, NP

## 2023-04-04 DIAGNOSIS — I700 Atherosclerosis of aorta: Secondary | ICD-10-CM | POA: Diagnosis not present

## 2023-04-04 DIAGNOSIS — M545 Low back pain, unspecified: Secondary | ICD-10-CM | POA: Diagnosis not present

## 2023-04-04 DIAGNOSIS — E1122 Type 2 diabetes mellitus with diabetic chronic kidney disease: Secondary | ICD-10-CM | POA: Diagnosis not present

## 2023-04-04 DIAGNOSIS — N189 Chronic kidney disease, unspecified: Secondary | ICD-10-CM | POA: Diagnosis not present

## 2023-04-04 DIAGNOSIS — I2510 Atherosclerotic heart disease of native coronary artery without angina pectoris: Secondary | ICD-10-CM | POA: Diagnosis not present

## 2023-04-04 DIAGNOSIS — M25512 Pain in left shoulder: Secondary | ICD-10-CM | POA: Diagnosis not present

## 2023-04-04 DIAGNOSIS — I129 Hypertensive chronic kidney disease with stage 1 through stage 4 chronic kidney disease, or unspecified chronic kidney disease: Secondary | ICD-10-CM | POA: Diagnosis not present

## 2023-04-04 DIAGNOSIS — G8929 Other chronic pain: Secondary | ICD-10-CM | POA: Diagnosis not present

## 2023-04-04 DIAGNOSIS — E1151 Type 2 diabetes mellitus with diabetic peripheral angiopathy without gangrene: Secondary | ICD-10-CM | POA: Diagnosis not present

## 2023-04-05 ENCOUNTER — Telehealth: Payer: Self-pay | Admitting: Internal Medicine

## 2023-04-05 NOTE — Telephone Encounter (Signed)
Pt c/o medication issue:  1. Name of Medication:   rosuvastatin (CRESTOR) 5 MG tablet   2. How are you currently taking this medication (dosage and times per day)?   As prescribed  3. Are you having a reaction (difficulty breathing--STAT)?   4. What is your medication issue?   Patient stated he has been having some muscle weakness with this medication and has now started having dizzy spells and has fallen twice.  Patient is concerned he needs to change this medication.

## 2023-04-05 NOTE — Telephone Encounter (Signed)
Called pt reports muscle weakness started soon after starting rosuvastatin 5 mg. Has had 2 falls in the last week. Pt reports BP ranges 120's and up.  No low BP that could be contributing to falls.  Advised pt to stop taking medication.  Will send message to provider and nurse will f/u next week.

## 2023-04-08 DIAGNOSIS — H401133 Primary open-angle glaucoma, bilateral, severe stage: Secondary | ICD-10-CM | POA: Diagnosis not present

## 2023-04-08 NOTE — Telephone Encounter (Signed)
Pt's son is returning call. Transferred to [place], CMA.

## 2023-04-08 NOTE — Telephone Encounter (Signed)
Contacted the patient to discuss med changed. Woman answered the phone and states  the patient is not home. She took a message and states she will have the patient contact the office.

## 2023-04-08 NOTE — Telephone Encounter (Signed)
Gaston Islam., NP  Macie Burows, RN2 days ago    Please advise patient to discontinue Crestor at this time and please cancel future lipid and LFT order placed for 6 to 8 weeks.  Please advise her to contact our office if muscle pain continues following discontinuation of medication.  Robin Searing, NP

## 2023-04-08 NOTE — Telephone Encounter (Signed)
Received a call from the patients son, ok per dpr, returning my call.   Gave him [facility] recommendations. He voiced understanding.

## 2023-04-10 DIAGNOSIS — I129 Hypertensive chronic kidney disease with stage 1 through stage 4 chronic kidney disease, or unspecified chronic kidney disease: Secondary | ICD-10-CM | POA: Diagnosis not present

## 2023-04-10 DIAGNOSIS — G8929 Other chronic pain: Secondary | ICD-10-CM | POA: Diagnosis not present

## 2023-04-10 DIAGNOSIS — I700 Atherosclerosis of aorta: Secondary | ICD-10-CM | POA: Diagnosis not present

## 2023-04-10 DIAGNOSIS — I2510 Atherosclerotic heart disease of native coronary artery without angina pectoris: Secondary | ICD-10-CM | POA: Diagnosis not present

## 2023-04-10 DIAGNOSIS — E1122 Type 2 diabetes mellitus with diabetic chronic kidney disease: Secondary | ICD-10-CM | POA: Diagnosis not present

## 2023-04-10 DIAGNOSIS — E1151 Type 2 diabetes mellitus with diabetic peripheral angiopathy without gangrene: Secondary | ICD-10-CM | POA: Diagnosis not present

## 2023-04-10 DIAGNOSIS — M25512 Pain in left shoulder: Secondary | ICD-10-CM | POA: Diagnosis not present

## 2023-04-10 DIAGNOSIS — M545 Low back pain, unspecified: Secondary | ICD-10-CM | POA: Diagnosis not present

## 2023-04-10 DIAGNOSIS — N189 Chronic kidney disease, unspecified: Secondary | ICD-10-CM | POA: Diagnosis not present

## 2023-04-11 ENCOUNTER — Ambulatory Visit: Payer: Medicare PPO | Admitting: Internal Medicine

## 2023-04-11 ENCOUNTER — Encounter: Payer: Self-pay | Admitting: Internal Medicine

## 2023-04-11 VITALS — BP 129/80 | HR 50 | Temp 98.1°F | Ht 62.0 in | Wt 113.0 lb

## 2023-04-11 DIAGNOSIS — R269 Unspecified abnormalities of gait and mobility: Secondary | ICD-10-CM | POA: Diagnosis not present

## 2023-04-11 DIAGNOSIS — R634 Abnormal weight loss: Secondary | ICD-10-CM

## 2023-04-11 DIAGNOSIS — N1832 Chronic kidney disease, stage 3b: Secondary | ICD-10-CM

## 2023-04-11 DIAGNOSIS — R296 Repeated falls: Secondary | ICD-10-CM | POA: Diagnosis not present

## 2023-04-11 DIAGNOSIS — E1121 Type 2 diabetes mellitus with diabetic nephropathy: Secondary | ICD-10-CM | POA: Diagnosis not present

## 2023-04-11 LAB — COMPREHENSIVE METABOLIC PANEL
ALT: 9 U/L (ref 0–35)
AST: 20 U/L (ref 0–37)
Albumin: 3.8 g/dL (ref 3.5–5.2)
Alkaline Phosphatase: 94 U/L (ref 39–117)
BUN: 24 mg/dL — ABNORMAL HIGH (ref 6–23)
CO2: 29 mEq/L (ref 19–32)
Calcium: 9.7 mg/dL (ref 8.4–10.5)
Chloride: 105 mEq/L (ref 96–112)
Creatinine, Ser: 1.19 mg/dL (ref 0.40–1.20)
GFR: 40.51 mL/min — ABNORMAL LOW (ref >60.00)
Glucose, Bld: 93 mg/dL (ref 70–99)
Potassium: 5.1 mEq/L (ref 3.5–5.1)
Sodium: 141 mEq/L (ref 135–145)
Total Bilirubin: 0.3 mg/dL (ref 0.2–1.2)
Total Protein: 7.4 g/dL (ref 6.0–8.3)

## 2023-04-11 NOTE — Progress Notes (Signed)
Subjective:  Patient ID: Breanna [name], female    DOB: Nov 12, 1931  Age: 89 y.o. MRN: [id number]  CC: Medical Management of Chronic Issues (79mo follow up/)   HPI [employer] presents for DM, HTN, wt loss  Outpatient Medications Prior to Visit  Medication Sig Dispense Refill   acetaminophen (TYLENOL) 325 MG tablet Take 2 tablets (650 mg total) by mouth every 6 (six) hours as needed for moderate pain or fever.     Alcohol Swabs (TRUE COMFORT ALCOHOL PREP PADS) 70 % PADS Use as directed 300 each 1   allopurinol (ZYLOPRIM) 100 MG tablet TAKE 1 TABLET BY MOUTH EVERY DAY 90 tablet 1   amLODipine (NORVASC) 5 MG tablet Take 1 tablet by mouth daily.     ascorbic acid (VITAMIN C) 500 MG tablet TAKE 1 TABLET BY MOUTH EVERY DAY 30 tablet 11   ASPIRIN LOW DOSE 81 MG tablet TAKE 1 TABLET BY MOUTH DAILY. SWALLOW WHOLE. 90 tablet 3   atropine 1 % ophthalmic solution Place 1 drop into both eyes daily.     Blood Glucose Calibration (TRUE METRIX LEVEL 1) Low SOLN USE AS DIRECTED 3 each 3   brimonidine (ALPHAGAN) 0.2 % ophthalmic solution Place 1 drop into the right eye 2 (two) times daily.     calcium carbonate (OSCAL) 1500 (600 Ca) MG TABS tablet TAKE 1 TABLET BY MOUTH EVERY DAY 90 tablet 3   Cholecalciferol (VITAMIN D3) 25 MCG (1000 UT) CAPS Take 1 capsule (1,000 Units total) by mouth daily. 90 capsule 3   diclofenac sodium (VOLTAREN) 1 % GEL Apply 2 g topically 3 (three) times daily. To left shoulder 200 g 3   docusate sodium (COLACE) 100 MG capsule TAKE 1 CAPSULE BY MOUTH TWICE A DAY 180 capsule 3   dorzolamide-timolol (COSOPT) 22.3-6.8 MG/ML ophthalmic solution Place 1 drop into both eyes 2 (two) times daily.      feeding supplement, ENSURE ENLIVE, (ENSURE ENLIVE) LIQD Take 237 mLs by mouth daily at 2 PM. 237 mL 12   gabapentin (NEURONTIN) 100 MG capsule TAKE 1 CAPSULE BY MOUTH THREE TIMES A DAY 90 capsule 5   glucose blood (GNP TRUE METRIX GLUCOSE STRIPS) test strip Use to check blood sugars  twice a day 200 each 2   latanoprost (XALATAN) 0.005 % ophthalmic solution Place 1 drop into both eyes at bedtime.      lidocaine (LIDODERM) 5 % Place 1 patch onto the skin daily. Remove & Discard patch within 12 hours or as directed by MD. Apply to left shoulder 30 patch 0   lidocaine-prilocaine (EMLA) cream Apply 1 application topically as needed. 30 g 0   losartan (COZAAR) 100 MG tablet Take 1 tablet (100 mg total) by mouth daily. 30 tablet 3   metFORMIN (GLUCOPHAGE) 500 MG tablet Take 1 tablet (500 mg total) by mouth daily with breakfast. 90 tablet 1   NON FORMULARY Place 10 drops under the tongue daily as needed (Pain). Medication: CBD oil     pantoprazole (PROTONIX) 40 MG tablet TAKE 1 TABLET BY MOUTH EVERY DAY 90 tablet 3   polyethylene glycol (MIRALAX / GLYCOLAX) 17 g packet Take 17 g by mouth daily as needed for mild constipation. 14 each 0   senna (SENOKOT) 8.6 MG TABS tablet Take 1 tablet (8.6 mg total) by mouth 2 (two) times daily. 120 tablet 0   sertraline (ZOLOFT) 100 MG tablet TAKE 1 TABLET BY MOUTH EVERY DAY 90 tablet 1   spironolactone (ALDACTONE)  25 MG tablet TAKE 1 TAB BY MOUTH MON - WED - FRI 90 tablet 1   traMADol (ULTRAM) 50 MG tablet Take 1 tablet (50 mg total) by mouth every 8 (eight) hours as needed for severe pain. 30 tablet 0   TRUEplus Lancets 33G MISC USE AS DIRECTED TO CHECK BLOOD SUGAR TWICE A DAY 200 each 1   valACYclovir (VALTREX) 500 MG tablet Take 1 tablet (500 mg total) by mouth daily as needed (for outbreaks).     vitamin B-12 (CYANOCOBALAMIN) 1000 MCG tablet TAKE 1 TABLET (1,000 MCG TOTAL) BY MOUTH EVERY DAY 90 tablet 3   gabapentin (NEURONTIN) 300 MG capsule Gabapentin 300 mg - take one with breakfast, one with supper and one at 3 am if woke up with pain 90 capsule 5   Netarsudil-Latanoprost 0.02-0.005 % SOLN Apply to eye.     Facility-Administered Medications Prior to Visit  Medication Dose Route Frequency Provider Last Rate Last Admin   regadenoson  (LEXISCAN) injection SOLN 0.4 mg  0.4 mg Intravenous Once Meriam Sprague, MD       technetium tetrofosmin (TC-MYOVIEW) injection 31.1 millicurie  31.1 millicurie Intravenous Once PRN Meriam Sprague, MD        ROS: Review of Systems  Constitutional:  Positive for fatigue. Negative for activity change, appetite change, chills and unexpected weight change.  HENT:  Negative for congestion, mouth sores and sinus pressure.   Eyes:  Positive for visual disturbance.  Respiratory:  Negative for cough and chest tightness.   Gastrointestinal:  Negative for abdominal pain and nausea.  Genitourinary:  Negative for difficulty urinating, frequency and vaginal pain.  Musculoskeletal:  Positive for back pain and gait problem.  Skin:  Negative for pallor and rash.  Neurological:  Negative for dizziness, tremors, weakness, numbness and headaches.  Psychiatric/Behavioral:  Positive for suicidal ideas. Negative for confusion and sleep disturbance.     Objective:  BP 129/80   Pulse (!) 50   Temp 98.1 F (36.7 C) (Oral)   Ht  (1.575 m)   Wt 113 lb (51.3 kg)   SpO2 96%   BMI 20.67 kg/m   BP Readings from Last 3 Encounters:  04/11/23 129/80  04/03/23 (!) 170/76  03/13/23 (!) 146/72    Wt Readings from Last 3 Encounters:  04/11/23 113 lb (51.3 kg)  04/03/23 119 lb (54 kg)  03/27/23 119 lb (54 kg)    Physical Exam Constitutional:      General: She is not in acute distress.    Appearance: She is well-developed. She is not ill-appearing.  HENT:     Head: Normocephalic.     Right Ear: External ear normal.     Left Ear: External ear normal.     Nose: Nose normal.  Eyes:     General:        Right eye: No discharge.        Left eye: No discharge.     Conjunctiva/sclera: Conjunctivae normal.     Pupils: Pupils are equal, round, and reactive to light.  Neck:     Thyroid: No thyromegaly.     Vascular: No JVD.     Trachea: No tracheal deviation.  Cardiovascular:     Rate and  Rhythm: Normal rate and regular rhythm.     Heart sounds: Normal heart sounds.  Pulmonary:     Effort: No respiratory distress.     Breath sounds: No stridor. No wheezing.  Abdominal:     General: Bowel  sounds are normal. There is no distension.     Palpations: Abdomen is soft. There is no mass.     Tenderness: There is no abdominal tenderness. There is no guarding or rebound.  Musculoskeletal:        General: Tenderness present.     Cervical back: Normal range of motion and neck supple. No rigidity.  Lymphadenopathy:     Cervical: No cervical adenopathy.  Skin:    Findings: No erythema or rash.  Neurological:     Mental Status: She is oriented to person, place, and time.     Cranial Nerves: No cranial nerve deficit.     Motor: No abnormal muscle tone.     Coordination: Coordination normal.     Deep Tendon Reflexes: Reflexes normal.  Psychiatric:        Behavior: Behavior normal.        Thought Content: Thought content normal.        Judgment: Judgment normal.    In a w/c Blind    A total time of 45 minutes was spent preparing to see the patient, reviewing tests, x-rays, operative reports and other medical records.  Also, obtaining history and performing comprehensive physical exam.  Additionally, counseling the patient regarding the above listed issues.   Finally, documenting clinical information in the health records, coordination of care, educating the patient - DM, HTN, falls, CRF  Lab Results  Component Value Date   WBC 4.1 03/02/2023   HGB 9.9 (L) 03/02/2023   HCT 31.0 (L) 03/02/2023   PLT 121 (L) 03/02/2023   GLUCOSE 98 03/02/2023   CHOL 127 03/01/2023   TRIG 42 03/01/2023   HDL 53 03/01/2023   LDLCALC 66 03/01/2023   ALT 10 03/01/2023   AST 18 03/01/2023   NA 133 (L) 03/02/2023   K 4.0 03/02/2023   CL 103 03/02/2023   CREATININE 1.26 (H) 03/02/2023   BUN 27 (H) 03/02/2023   CO2 24 03/02/2023   TSH 0.64 08/24/2022   HGBA1C 6.2 02/13/2023    VAS [place] LOWER  EXTREMITY VENOUS (DVT)  Result Date: 03/03/2023  Lower Venous DVT Study Patient Name:  Breanna [name]  Date of Exam:   03/01/2023 Medical Rec #: [id number]        Accession #:    [id number] Date of Birth: Dec 01, 1931         Patient Gender: F Patient Age:   56 years Exam Location:  [facility] Procedure:      VAS [place] LOWER EXTREMITY VENOUS (DVT) Referring Phys: RIPUDEEP RAI --------------------------------------------------------------------------------  Indications: Edema.  Risk Factors: None identified. Limitations: Poor ultrasound/tissue interface. Comparison Study: No prior studies. Performing Technologist: Chanda Busing RVT  Examination Guidelines: A complete evaluation includes B-mode imaging, spectral Doppler, color Doppler, and power Doppler as needed of all accessible portions of each vessel. Bilateral testing is considered an integral part of a complete examination. Limited examinations for reoccurring indications may be performed as noted. The reflux portion of the exam is performed with the patient in reverse Trendelenburg.  +---------+---------------+---------+-----------+----------+--------------+ RIGHT    CompressibilityPhasicitySpontaneityPropertiesThrombus Aging +---------+---------------+---------+-----------+----------+--------------+ CFV      Full           Yes      Yes                                 +---------+---------------+---------+-----------+----------+--------------+ SFJ      Full                                                        +---------+---------------+---------+-----------+----------+--------------+  FV Prox  Full                                                        +---------+---------------+---------+-----------+----------+--------------+ FV Mid   Full                                                        +---------+---------------+---------+-----------+----------+--------------+ FV DistalFull                                                         +---------+---------------+---------+-----------+----------+--------------+ PFV      Full                                                        +---------+---------------+---------+-----------+----------+--------------+ POP      Full           Yes      Yes                                 +---------+---------------+---------+-----------+----------+--------------+ PTV      Full                                                        +---------+---------------+---------+-----------+----------+--------------+ PERO     Full                                                        +---------+---------------+---------+-----------+----------+--------------+   +---------+---------------+---------+-----------+----------+--------------+ LEFT     CompressibilityPhasicitySpontaneityPropertiesThrombus Aging +---------+---------------+---------+-----------+----------+--------------+ CFV      Full           Yes      Yes                                 +---------+---------------+---------+-----------+----------+--------------+ SFJ      Full                                                        +---------+---------------+---------+-----------+----------+--------------+ FV Prox  Full                                                        +---------+---------------+---------+-----------+----------+--------------+  FV Mid   Full                                                        +---------+---------------+---------+-----------+----------+--------------+ FV DistalFull                                                        +---------+---------------+---------+-----------+----------+--------------+ PFV      Full                                                        +---------+---------------+---------+-----------+----------+--------------+ POP      Full           Yes      Yes                                  +---------+---------------+---------+-----------+----------+--------------+ PTV      Full                                                        +---------+---------------+---------+-----------+----------+--------------+ PERO     Full                                                        +---------+---------------+---------+-----------+----------+--------------+     Summary: RIGHT: - There is no evidence of deep vein thrombosis in the lower extremity.  - No cystic structure found in the popliteal fossa.  LEFT: - There is no evidence of deep vein thrombosis in the lower extremity.  - No cystic structure found in the popliteal fossa.  *See table(s) above for measurements and observations. Electronically signed by Heath Lark on 03/03/2023 at 4:16:14 PM.    Final    CT Angio Chest Pulmonary Embolism (PE) W or WO Contrast  Result Date: 03/01/2023 CLINICAL DATA:  Lungs are intermediate probability for PE. Positive D-dimer. EXAM: CT ANGIOGRAPHY CHEST WITH CONTRAST TECHNIQUE: Multidetector CT imaging of the chest was performed using the standard protocol during bolus administration of intravenous contrast. Multiplanar CT image reconstructions and MIPs were obtained to evaluate the vascular anatomy. RADIATION DOSE REDUCTION: This exam was performed according to the departmental dose-optimization program which includes automated exposure control, adjustment of the mA and/or kV according to patient size and/or use of iterative reconstruction technique. CONTRAST:  75mL OMNIPAQUE IOHEXOL 350 MG/ML SOLN COMPARISON:  Chest CT 08/15/2017 FINDINGS: Cardiovascular: Heart is enlarged. Aortic is ectatic without focal dilatation. There is adequate opacification of the pulmonary arteries to the segmental level. There is no evidence for pulmonary embolism. There are atherosclerotic calcifications of the aorta. Mediastinum/Nodes: No enlarged mediastinal, hilar, or axillary lymph nodes.  Esophagus and trachea are within  normal limits. The thyroid gland is diffusely heterogeneous. There are calcified mediastinal and right hilar lymph nodes. Lungs/Pleura: Lungs are clear. No pleural effusion or pneumothorax. Upper Abdomen: No acute abnormality. There are calcified granulomas in the liver and spleen. Rounded mildly hyperdense area in the superior pole of the left kidney is new from prior measuring 9 mm. Musculoskeletal: The bones are osteopenic. Degenerative changes affect the spine. Review of the MIP images confirms the above findings. IMPRESSION: 1. No evidence for pulmonary embolism or other acute cardiopulmonary process. 2. Cardiomegaly. 3. New 9 mm hyperdense lesion in the superior pole of the left kidney. This may represent a hyperdense cyst, but is indeterminate. This can be further evaluated with ultrasound. Aortic Atherosclerosis (ICD10-I70.0). Electronically Signed   By: Darliss Cheney M.D.   On: 03/01/2023 21:40   ECHOCARDIOGRAM COMPLETE  Result Date: 03/01/2023    ECHOCARDIOGRAM REPORT   Patient Name:   Breanna [name] Date of Exam: 03/01/2023 Medical Rec #:  [id number]       Height:       62.0 in Accession #:    [id number]      Weight:       113.8 lb Date of Birth:  17-Oct-1931        BSA:          1.504 m Patient Age:    89 years        BP:           147/70 mmHg Patient Gender: F               HR:           51 bpm. Exam Location:  Inpatient Procedure: 2D Echo, Cardiac Doppler and Color Doppler Indications:    elevated troponin  History:        Patient has no prior history of Echocardiogram examinations.                 Stroke; Risk Factors:Hypertension and Dyslipidemia.  Sonographer:    Mike Gip Referring Phys: [id number] JUSTIN B HOWERTER IMPRESSIONS  1. Left ventricular ejection fraction, by estimation, is 55 to 60%. The left ventricle has normal function. The left ventricle has no regional wall motion abnormalities. Left ventricular diastolic parameters are consistent with Grade II diastolic dysfunction  (pseudonormalization).  2. Right ventricular systolic function is normal. The right ventricular size is normal. There is mildly elevated pulmonary artery systolic pressure. The estimated right ventricular systolic pressure is 39.7 mmHg.  3. Left atrial size was moderately dilated.  4. The mitral valve is degenerative. Mild to moderate mitral valve regurgitation. No evidence of mitral stenosis.  5. Tricuspid valve regurgitation is moderate.  6. The aortic valve is tricuspid. There is moderate calcification of the aortic valve. Aortic valve regurgitation is trivial. No aortic stenosis is present.  7. The inferior vena cava is dilated in size with >50% respiratory variability, suggesting right atrial pressure of 8 mmHg. FINDINGS  Left Ventricle: Left ventricular ejection fraction, by estimation, is 55 to 60%. The left ventricle has normal function. The left ventricle has no regional wall motion abnormalities. The left ventricular internal cavity size was normal in size. There is  no left ventricular hypertrophy. Left ventricular diastolic parameters are consistent with Grade II diastolic dysfunction (pseudonormalization). Right Ventricle: The right ventricular size is normal. No increase in right ventricular wall thickness. Right ventricular systolic function is normal. There is mildly elevated pulmonary artery systolic pressure. The  tricuspid regurgitant velocity is 3.03  m/s, and with an assumed right atrial pressure of 3 mmHg, the estimated right ventricular systolic pressure is 39.7 mmHg. Left Atrium: Left atrial size was moderately dilated. Right Atrium: Right atrial size was normal in size. Pericardium: There is no evidence of pericardial effusion. Mitral Valve: The mitral valve is degenerative in appearance. Mild to moderate mitral valve regurgitation, with posteriorly-directed jet. No evidence of mitral valve stenosis. Tricuspid Valve: The tricuspid valve is normal in structure. Tricuspid valve regurgitation is  moderate . No evidence of tricuspid stenosis. Aortic Valve: The aortic valve is tricuspid. There is moderate calcification of the aortic valve. Aortic valve regurgitation is trivial. Aortic regurgitation PHT measures 763 msec. No aortic stenosis is present. Pulmonic Valve: The pulmonic valve was not well visualized. Pulmonic valve regurgitation is trivial. No evidence of pulmonic stenosis. Aorta: The aortic root is normal in size and structure. Venous: The inferior vena cava is dilated in size with greater than 50% respiratory variability, suggesting right atrial pressure of 8 mmHg. IAS/Shunts: No atrial level shunt detected by color flow Doppler.  LEFT VENTRICLE PLAX 2D LVIDd:         4.20 cm      Diastology LVIDs:         2.90 cm      LV e' medial:    4.05 cm/s LV PW:         1.30 cm      LV E/e' medial:  18.1 LV IVS:        1.40 cm      LV e' lateral:   5.43 cm/s LVOT diam:     1.90 cm      LV E/e' lateral: 13.5 LV SV:         64 LV SV Index:   42 LVOT Area:     2.84 cm  LV Volumes (MOD) LV vol d, MOD A2C: 128.0 ml LV vol d, MOD A4C: 110.0 ml LV vol s, MOD A2C: 51.8 ml LV vol s, MOD A4C: 38.1 ml LV SV MOD A2C:     76.2 ml LV SV MOD A4C:     110.0 ml LV SV MOD BP:      76.8 ml RIGHT VENTRICLE             IVC RV Basal diam:  3.90 cm     IVC diam: 2.10 cm RV S prime:     11.10 cm/s TAPSE (M-mode): 1.7 cm LEFT ATRIUM             Index        RIGHT ATRIUM           Index LA diam:        3.80 cm 2.53 cm/m   RA Area:     12.70 cm LA Vol (A2C):   62.6 ml 41.62 ml/m  RA Volume:   26.30 ml  17.49 ml/m LA Vol (A4C):   81.0 ml 53.85 ml/m LA Biplane Vol: 75.6 ml 50.26 ml/m  AORTIC VALVE LVOT Vmax:   96.00 cm/s LVOT Vmean:  60.100 cm/s LVOT VTI:    0.225 m AI PHT:      763 msec  AORTA Ao Root diam: 3.00 cm Ao Asc diam:  2.70 cm MITRAL VALVE                  TRICUSPID VALVE MV Area (PHT): 3.03 cm       TR Peak grad:   36.7  mmHg MV Decel Time: 250 msec       TR Vmax:        303.00 cm/s MR Peak grad:    159.3 mmHg MR Mean  grad:    99.0 mmHg    SHUNTS MR Vmax:         631.00 cm/s  Systemic VTI:  0.22 m MR Vmean:        469.0 cm/s   Systemic Diam: 1.90 cm MR PISA:         1.57 cm MR PISA Eff ROA: 8 mm MR PISA Radius:  0.50 cm MV E velocity: 73.20 cm/s MV A velocity: 51.90 cm/s MV E/A ratio:  1.41 Arvilla Meres MD Electronically signed by Arvilla Meres MD Signature Date/Time: 03/01/2023/9:39:08 AM    Final    DG Shoulder Left  Result Date: 03/01/2023 CLINICAL DATA:  Left shoulder pain.  No known injury EXAM: LEFT SHOULDER - 2 VIEW COMPARISON:  None Available. FINDINGS: There is no evidence of fracture or dislocation. Mild superior subluxation of the humeral head relative to the glenoid. Degenerative changes of the acromioclavicular joint. Soft tissues are unremarkable. IMPRESSION: 1. Mild superior subluxation of the humeral head relative to the glenoid, which can be seen with rotator cuff pathology. Degenerative changes of the acromioclavicular joint. 2.  No acute fracture or dislocation. Electronically Signed   By: Agustin Cree M.D.   On: 03/01/2023 08:17   DG Chest 2 View  Result Date: 02/28/2023 CLINICAL DATA:  Left shoulder pain. EXAM: CHEST - 2 VIEW COMPARISON:  Radiograph 08/24/2022 FINDINGS: Stable heart size allowing for AP technique. Unchanged mediastinal contours. Linear subsegmental opacity in the left upper and lower lobes typical of atelectasis. No pneumothorax or pleural effusion. Normal pulmonary vasculature. Thoracic spondylosis with anterior spurring. There is mild acromioclavicular degenerative change of both shoulders. IMPRESSION: Linear subsegmental atelectasis in the left upper and lower lobes. Electronically Signed   By: Narda Rutherford M.D.   On: 02/28/2023 15:10    Assessment & Plan:   Problem List Items Addressed This Visit       Endocrine   DM2 (diabetes mellitus, type 2) - Primary    Chronic On Metformin      Relevant Orders   Comprehensive metabolic panel     Genitourinary   Stage  3b chronic kidney disease (CKD)    Hydrate well        Other   Falls frequently    Dizzy on statins - resolved off stating      Gait disorder    No falls when Braeleigh stopped a statin      Weight loss    Will watch. Probably normal wt for age         No orders of the defined types were placed in this encounter.     Follow-up: Return in about 4 months (around 08/11/2023) for a follow-up visit.  Sonda Primes, MD

## 2023-04-11 NOTE — Assessment & Plan Note (Signed)
Hydrate well 

## 2023-04-11 NOTE — Assessment & Plan Note (Signed)
Chronic On Metformin 

## 2023-04-11 NOTE — Assessment & Plan Note (Signed)
No falls when Breanna [name] stopped a statin

## 2023-04-11 NOTE — Assessment & Plan Note (Signed)
Dizzy on statins - resolved off stating

## 2023-04-11 NOTE — Assessment & Plan Note (Signed)
Will watch. Probably normal wt for age

## 2023-04-17 DIAGNOSIS — I129 Hypertensive chronic kidney disease with stage 1 through stage 4 chronic kidney disease, or unspecified chronic kidney disease: Secondary | ICD-10-CM | POA: Diagnosis not present

## 2023-04-17 DIAGNOSIS — M25512 Pain in left shoulder: Secondary | ICD-10-CM | POA: Diagnosis not present

## 2023-04-17 DIAGNOSIS — E1151 Type 2 diabetes mellitus with diabetic peripheral angiopathy without gangrene: Secondary | ICD-10-CM | POA: Diagnosis not present

## 2023-04-17 DIAGNOSIS — N189 Chronic kidney disease, unspecified: Secondary | ICD-10-CM | POA: Diagnosis not present

## 2023-04-17 DIAGNOSIS — M545 Low back pain, unspecified: Secondary | ICD-10-CM | POA: Diagnosis not present

## 2023-04-17 DIAGNOSIS — I2510 Atherosclerotic heart disease of native coronary artery without angina pectoris: Secondary | ICD-10-CM | POA: Diagnosis not present

## 2023-04-17 DIAGNOSIS — E1122 Type 2 diabetes mellitus with diabetic chronic kidney disease: Secondary | ICD-10-CM | POA: Diagnosis not present

## 2023-04-17 DIAGNOSIS — G8929 Other chronic pain: Secondary | ICD-10-CM | POA: Diagnosis not present

## 2023-04-17 DIAGNOSIS — I700 Atherosclerosis of aorta: Secondary | ICD-10-CM | POA: Diagnosis not present

## 2023-04-18 ENCOUNTER — Other Ambulatory Visit: Payer: Self-pay | Admitting: Internal Medicine

## 2023-04-19 ENCOUNTER — Ambulatory Visit: Payer: Medicare PPO | Admitting: Podiatry

## 2023-04-19 ENCOUNTER — Encounter: Payer: Self-pay | Admitting: Podiatry

## 2023-04-19 DIAGNOSIS — B351 Tinea unguium: Secondary | ICD-10-CM

## 2023-04-19 DIAGNOSIS — E1142 Type 2 diabetes mellitus with diabetic polyneuropathy: Secondary | ICD-10-CM | POA: Diagnosis not present

## 2023-04-19 DIAGNOSIS — M2141 Flat foot [pes planus] (acquired), right foot: Secondary | ICD-10-CM | POA: Diagnosis not present

## 2023-04-19 DIAGNOSIS — Q828 Other specified congenital malformations of skin: Secondary | ICD-10-CM

## 2023-04-19 DIAGNOSIS — E119 Type 2 diabetes mellitus without complications: Secondary | ICD-10-CM | POA: Diagnosis not present

## 2023-04-19 DIAGNOSIS — M2142 Flat foot [pes planus] (acquired), left foot: Secondary | ICD-10-CM | POA: Diagnosis not present

## 2023-04-19 DIAGNOSIS — M79674 Pain in right toe(s): Secondary | ICD-10-CM

## 2023-04-19 DIAGNOSIS — M79675 Pain in left toe(s): Secondary | ICD-10-CM

## 2023-04-19 NOTE — Patient Instructions (Signed)
Purchase Nervive Pain Cream or Roll On. Apply to feet before bedtime. Can be purchased at your local drug store over the counter. 

## 2023-04-19 NOTE — Progress Notes (Signed)
ANNUAL DIABETIC FOOT EXAM  Subjective: Breanna [name] presents today for annual diabetic foot examination.  Chief Complaint  Patient presents with   Diabetes    DFC BS - DONT CHECK IT A1C - 5.6 LVPCP - 2 WKS AGO   Patient confirms h/o diabetes.  Patient denies any h/o foot wounds.  Patient has been diagnosed with neuropathy.  Risk factors: diabetes, diabetic neuropathy, history of gout, h/o CVA, HTN, dyslipidemia.  Plotnikov, Georgina Quint, MD is patient's PCP.  Past Medical History:  Diagnosis Date   Adrenal adenoma 2006   Boils 2009   Cholelithiasis    asympt. w/normal HIDA 03/2010   CVA (cerebral infarction) 2010   Cerebellar   Diverticulitis    Esophageal spasm 2011   GERD (gastroesophageal reflux disease)    Gout    History of colon polyps    HTN (hypertension)    Hydronephrosis    LEFT/ Surgical intervention   Hyperlipidemia    LBP (low back pain)    Osteoarthritis    Pulmonary HTN (HCC)    Stress    Type II or unspecified type diabetes mellitus without mention of complication, not stated as uncontrolled    Patient Active Problem List   Diagnosis Date Noted   Elevated troponin 03/01/2023   Stage 3b chronic kidney disease (CKD) (HCC) 03/01/2023   Depression 03/01/2023   Left shoulder pain 02/28/2023   Pes anserinus bursitis of both knees 05/23/2022   Gait disorder 03/07/2022   Falls frequently 11/29/2021   Vision loss 11/29/2021   Parkinsonian features 05/23/2021   Atherosclerosis of aorta (HCC) 05/23/2021   Anemia 08/18/2020   Constipation 08/18/2020   Hip fracture requiring operative repair (HCC) 07/21/2020   Sinus bradycardia 07/21/2020   Hip pain 07/06/2020   Fall against object 12/25/2018   Head contusion 12/25/2018   Acute pain of right knee 09/28/2018   Well adult exam 05/13/2018   Cold sore 05/13/2018   Night sweats 04/04/2018   Breast symptom 10/30/2017   Diarrhea 08/21/2017   UTI (urinary tract infection) 08/21/2017   Lung nodule  08/21/2017   FTT (failure to thrive) in adult 08/21/2017   Shortness of breath 08/13/2017   Cough 08/13/2017   Fatigue 08/13/2017   Fever 08/13/2017   Blurry vision 08/13/2017   Shoulder pain, right 07/25/2017   Callus of foot 06/07/2017   Need for prophylactic vaccination and inoculation against influenza 11/21/2016   Trigger finger, acquired 11/12/2016   Meteorism 07/11/2016   Dark stools 01/10/2016   Osteoporosis 07/13/2015   Fall at home 01/13/2015   Cornea disorder 11/22/2014   Primary open angle glaucoma of both eyes, indeterminate stage 11/22/2014   Secondary corneal edema 10/14/2014   Swelling of left knee joint 04/07/2014   Grief 01/04/2014   Syncope 01/04/2014   Thoracic spine pain 10/22/2013   Hypernatremia 07/07/2013   Viral intestinal infection 03/25/2013   Dehydration, moderate 03/24/2013   Food poisoning 03/24/2013   Headache(784.0) 12/22/2012   URI (upper respiratory infection) 10/21/2012   Diabetic neuropathy (HCC) 07/30/2012   Neck pain on right side 07/30/2012   Cerumen impaction 07/17/2012   Cystoid macular edema 06/19/2012   History of corneal transplant 06/19/2012   Weight loss 04/10/2012   Wrist pain, right 04/06/2011   PARESTHESIA 01/03/2011   Full incontinence of feces 01/01/2011   ABDOMINAL PAIN RIGHT UPPER QUADRANT 01/01/2011   LEG PAIN, LEFT 10/02/2010   History of cardioembolic cerebrovascular accident (CVA) 03/27/2010   GERD (gastroesophageal reflux disease) 03/27/2010  DIVERTICULOSIS-COLON 03/27/2010   CHOLELITHIASIS 03/27/2010   Cerebral artery occlusion with cerebral infarction (HCC) 03/27/2010   LOW BACK PAIN 09/07/2009   History of anemia due to chronic kidney disease 06/14/2009   DYSPHAGIA 05/16/2009   Dysphagia 05/16/2009   Dyslipidemia 11/06/2007   Hyperlipidemia 11/06/2007   DM2 (diabetes mellitus, type 2) (HCC) 07/29/2007   Gout 07/29/2007   Essential hypertension 07/29/2007   Hydronephrosis 07/29/2007   Osteoarthritis  07/29/2007   COLONIC POLYPS, HX OF 07/29/2007   DIVERTICULITIS, HX OF 07/29/2007   Past Surgical History:  Procedure Laterality Date   ABDOMINAL HYSTERECTOMY     APPENDECTOMY     2020   BACK SURGERY     BREAST BIOPSY     RIGHT   CATARACT EXTRACTION, BILATERAL     COLECTOMY  2006   Sigmoid   FOOT SURGERY     BILATERAL   HAMMER TOE SURGERY     INTRAMEDULLARY (IM) NAIL INTERTROCHANTERIC Right 07/21/2020   Procedure: INTRAMEDULLARY (IM) NAIL INTERTROCHANTRIC;  Surgeon: Sheral Apley, MD;  Location: [facility] OR;  Service: Orthopedics;  Laterality: Right;   ROTATOR CUFF REPAIR  2008   RIGHT   TOTAL KNEE ARTHROPLASTY  2003   LEFT   VARICOSE VEIN SURGERY     vein stripping/lower extremities   Current Outpatient Medications on File Prior to Visit  Medication Sig Dispense Refill   losartan (COZAAR) 50 MG tablet TAKE 1 TABLET BY MOUTH EVERY DAY 90 tablet 3   acetaminophen (TYLENOL) 325 MG tablet Take 2 tablets (650 mg total) by mouth every 6 (six) hours as needed for moderate pain or fever.     Alcohol Swabs (TRUE COMFORT ALCOHOL PREP PADS) 70 % PADS Use as directed 300 each 1   allopurinol (ZYLOPRIM) 100 MG tablet TAKE 1 TABLET BY MOUTH EVERY DAY 90 tablet 1   amLODipine (NORVASC) 5 MG tablet Take 1 tablet by mouth daily.     ascorbic acid (VITAMIN C) 500 MG tablet TAKE 1 TABLET BY MOUTH EVERY DAY 30 tablet 11   ASPIRIN LOW DOSE 81 MG tablet TAKE 1 TABLET BY MOUTH DAILY. SWALLOW WHOLE. 90 tablet 3   atropine 1 % ophthalmic solution Place 1 drop into both eyes daily.     Blood Glucose Calibration (TRUE METRIX LEVEL 1) Low SOLN USE AS DIRECTED 3 each 3   brimonidine (ALPHAGAN) 0.2 % ophthalmic solution Place 1 drop into the right eye 2 (two) times daily.     calcium carbonate (OSCAL) 1500 (600 Ca) MG TABS tablet TAKE 1 TABLET BY MOUTH EVERY DAY 90 tablet 3   Cholecalciferol (VITAMIN D3) 25 MCG (1000 UT) CAPS Take 1 capsule (1,000 Units total) by mouth daily. 90 capsule 3   diclofenac sodium  (VOLTAREN) 1 % GEL Apply 2 g topically 3 (three) times daily. To left shoulder 200 g 3   docusate sodium (COLACE) 100 MG capsule TAKE 1 CAPSULE BY MOUTH TWICE A DAY 180 capsule 3   dorzolamide-timolol (COSOPT) 22.3-6.8 MG/ML ophthalmic solution Place 1 drop into both eyes 2 (two) times daily.      feeding supplement, ENSURE ENLIVE, (ENSURE ENLIVE) LIQD Take 237 mLs by mouth daily at 2 PM. 237 mL 12   gabapentin (NEURONTIN) 100 MG capsule TAKE 1 CAPSULE BY MOUTH THREE TIMES A DAY 90 capsule 5   glucose blood (GNP TRUE METRIX GLUCOSE STRIPS) test strip Use to check blood sugars twice a day 200 each 2   latanoprost (XALATAN) 0.005 % ophthalmic solution Place  1 drop into both eyes at bedtime.      lidocaine (LIDODERM) 5 % Place 1 patch onto the skin daily. Remove & Discard patch within 12 hours or as directed by MD. Apply to left shoulder 30 patch 0   lidocaine-prilocaine (EMLA) cream Apply 1 application topically as needed. 30 g 0   losartan (COZAAR) 100 MG tablet Take 1 tablet (100 mg total) by mouth daily. 30 tablet 3   metFORMIN (GLUCOPHAGE) 500 MG tablet Take 1 tablet (500 mg total) by mouth daily with breakfast. 90 tablet 1   Netarsudil-Latanoprost 0.02-0.005 % SOLN Apply to eye.     NON FORMULARY Place 10 drops under the tongue daily as needed (Pain). Medication: CBD oil     pantoprazole (PROTONIX) 40 MG tablet TAKE 1 TABLET BY MOUTH EVERY DAY 90 tablet 3   polyethylene glycol (MIRALAX / GLYCOLAX) 17 g packet Take 17 g by mouth daily as needed for mild constipation. 14 each 0   senna (SENOKOT) 8.6 MG TABS tablet Take 1 tablet (8.6 mg total) by mouth 2 (two) times daily. 120 tablet 0   sertraline (ZOLOFT) 100 MG tablet TAKE 1 TABLET BY MOUTH EVERY DAY 90 tablet 1   spironolactone (ALDACTONE) 25 MG tablet TAKE 1 TAB BY MOUTH MON - WED - FRI 90 tablet 1   traMADol (ULTRAM) 50 MG tablet Take 1 tablet (50 mg total) by mouth every 8 (eight) hours as needed for severe pain. 30 tablet 0   TRUEplus  Lancets 33G MISC USE AS DIRECTED TO CHECK BLOOD SUGAR TWICE A DAY 200 each 1   valACYclovir (VALTREX) 500 MG tablet Take 1 tablet (500 mg total) by mouth daily as needed (for outbreaks).     vitamin B-12 (CYANOCOBALAMIN) 1000 MCG tablet TAKE 1 TABLET (1,000 MCG TOTAL) BY MOUTH EVERY DAY 90 tablet 3   Current Facility-Administered Medications on File Prior to Visit  Medication Dose Route Frequency Provider Last Rate Last Admin   regadenoson (LEXISCAN) injection SOLN 0.4 mg  0.4 mg Intravenous Once Meriam Sprague, MD       technetium tetrofosmin (TC-MYOVIEW) injection 31.1 millicurie  31.1 millicurie Intravenous Once PRN Meriam Sprague, MD        Allergies  Allergen Reactions   Verapamil Shortness Of Breath    REACTION: SOB   Aspirin Itching    But tolerates low dose   Atenolol     REACTION: fatigue   Codeine Itching   Codeine Sulfate    Hydrochlorothiazide     REACTION: gout   Hydrocodone       side effects - hallucinations   Ibuprofen     Upset stomach w/high doses   Lisinopril     REACTION: cough   Onion Other (See Comments)    Dry mouth/ gets sores   Shellfish Allergy Swelling    Patient stated she does not eat shellfish, she swells up on different parts of the body   Statins     Dizzy, falls   Valsartan Itching   Adhesive  [Tape] Rash   Other Rash   Social History   Occupational History   Occupation: Retired    Associate Professor: RETIRED  Tobacco Use   Smoking status: Never   Smokeless tobacco: Never  Vaping Use   Vaping Use: Never used  Substance and Sexual Activity   Alcohol use: No    Alcohol/week: 0.0 standard drinks of alcohol   Drug use: No   Sexual activity: Not Currently  Family History  Problem Relation Age of Onset   Prostate cancer Father    Hypertension Father    Cancer Father        prostate   Heart disease Mother    Diabetes Mother    Diabetes Other        1st degree relative   Heart disease Other    Hypertension Other    Prostate  cancer Maternal Uncle    Cancer Maternal Uncle        prostate   Breast cancer Daughter    Cancer Daughter        breast   Colon cancer Neg Hx    Immunization History  Administered Date(s) Administered   Fluad Quad(high Dose 65+) 09/16/2019, 10/20/2020   H1N1 01/06/2009   Influenza Split 10/01/2011, 09/08/2012   Influenza Whole 10/07/2009, 09/29/2010   Influenza, High Dose Seasonal PF 10/06/2013, 11/21/2016, 09/25/2017, 09/22/2018, 11/08/2021, 11/07/2022   Influenza-Unspecified 10/09/2014, 09/09/2015   Moderna Covid-19 Vaccine Bivalent Booster 44yrs & up 11/14/2021   PFIZER(Purple Top)SARS-COV-2 Vaccination 01/12/2020, 02/02/2020, 09/26/2020, 02/08/2023   Pneumococcal Conjugate-13 04/07/2014   Pneumococcal Polysaccharide-23 06/26/2007, 03/14/2018   Td 06/26/2007   Td (Adult), 2 Lf Tetanus Toxid, Preservative Free 06/26/2007   Tdap 05/13/2018   Zoster, Live 10/04/2011     Review of Systems: Negative except as noted in the HPI.   Objective: There were no vitals filed for this visit.  Breanna [name] is a pleasant 89 y.o. female in NAD. AAO X 3.  Vascular Examination: CFT <3 seconds b/l LE. Palpable DP pulse(s) b/l LE. Palpable PT pulse(s) b/l LE. Pedal hair sparse. No pain with calf compression b/l. Lower extremity skin temperature gradient within normal limits. No edema noted b/l LE. No cyanosis or clubbing noted b/l LE.  Dermatological Examination: Pedal skin is warm and supple b/l LE. No open wounds b/l LE. No interdigital macerations noted b/l LE. Toenails 3-5 bilaterally, L hallux, L 2nd toe, and R hallux elongated, discolored, dystrophic, thickened, and crumbly with subungual debris and tenderness to dorsal palpation. Anonychia noted right second digit. Nailbed(s) epithelialized.  Porokeratotic lesion(s) right heel. No erythema, no edema, no drainage, no fluctuance.  Neurological Examination: Pt has subjective symptoms of neuropathy. Protective sensation intact 5/5 intact  bilaterally with 10g monofilament b/l. Vibratory sensation intact b/l.  Musculoskeletal Examination: Muscle strength 5/5 to all lower extremity muscle groups bilaterally. Limited joint ROM to the left ankle and right ankle. Pes planus deformity noted bilateral LE.   Lab Results  Component Value Date   HGBA1C 6.2 02/13/2023   ADA Risk Categorization: Low Risk :  Patient has all of the following: Intact protective sensation No prior foot ulcer  No severe deformity Pedal pulses present  Assessment: 1. Pain due to onychomycosis of toenails of both feet   2. Porokeratosis   3. Diabetic peripheral neuropathy associated with type 2 diabetes mellitus (HCC)   4. Pes planus of both feet   5. Encounter for diabetic foot exam [facility])     Plan: -Consent given for treatment as described below: -Examined patient. -Mycotic toenails 3-5 bilaterally, L hallux, and L 2nd toe were debrided in length and girth with sterile nail nippers and dremel without iatrogenic bleeding. -Porokeratotic lesion(s) right heel pared and enucleated with sterile currette without incident. Total number of lesions debrided=1. -Patient/POA to call should there be question/concern in the interim. Return in about 3 months (around 07/19/2023).  Freddie Breech, DPM

## 2023-04-23 DIAGNOSIS — Z86010 Personal history of colonic polyps: Secondary | ICD-10-CM

## 2023-04-23 DIAGNOSIS — K5730 Diverticulosis of large intestine without perforation or abscess without bleeding: Secondary | ICD-10-CM

## 2023-04-23 DIAGNOSIS — R001 Bradycardia, unspecified: Secondary | ICD-10-CM

## 2023-04-23 DIAGNOSIS — M810 Age-related osteoporosis without current pathological fracture: Secondary | ICD-10-CM

## 2023-04-23 DIAGNOSIS — E1151 Type 2 diabetes mellitus with diabetic peripheral angiopathy without gangrene: Secondary | ICD-10-CM | POA: Diagnosis not present

## 2023-04-23 DIAGNOSIS — Z7984 Long term (current) use of oral hypoglycemic drugs: Secondary | ICD-10-CM

## 2023-04-23 DIAGNOSIS — Z96659 Presence of unspecified artificial knee joint: Secondary | ICD-10-CM

## 2023-04-23 DIAGNOSIS — G40909 Epilepsy, unspecified, not intractable, without status epilepticus: Secondary | ICD-10-CM

## 2023-04-23 DIAGNOSIS — E1122 Type 2 diabetes mellitus with diabetic chronic kidney disease: Secondary | ICD-10-CM | POA: Diagnosis not present

## 2023-04-23 DIAGNOSIS — N189 Chronic kidney disease, unspecified: Secondary | ICD-10-CM | POA: Diagnosis not present

## 2023-04-23 DIAGNOSIS — E785 Hyperlipidemia, unspecified: Secondary | ICD-10-CM

## 2023-04-23 DIAGNOSIS — R1310 Dysphagia, unspecified: Secondary | ICD-10-CM

## 2023-04-23 DIAGNOSIS — I2510 Atherosclerotic heart disease of native coronary artery without angina pectoris: Secondary | ICD-10-CM | POA: Diagnosis not present

## 2023-04-23 DIAGNOSIS — Z8673 Personal history of transient ischemic attack (TIA), and cerebral infarction without residual deficits: Secondary | ICD-10-CM

## 2023-04-23 DIAGNOSIS — Z87440 Personal history of urinary (tract) infections: Secondary | ICD-10-CM

## 2023-04-23 DIAGNOSIS — I129 Hypertensive chronic kidney disease with stage 1 through stage 4 chronic kidney disease, or unspecified chronic kidney disease: Secondary | ICD-10-CM | POA: Diagnosis not present

## 2023-04-23 DIAGNOSIS — M25512 Pain in left shoulder: Secondary | ICD-10-CM | POA: Diagnosis not present

## 2023-04-23 DIAGNOSIS — M545 Low back pain, unspecified: Secondary | ICD-10-CM | POA: Diagnosis not present

## 2023-04-23 DIAGNOSIS — R32 Unspecified urinary incontinence: Secondary | ICD-10-CM

## 2023-04-23 DIAGNOSIS — Z87442 Personal history of urinary calculi: Secondary | ICD-10-CM

## 2023-04-23 DIAGNOSIS — H401130 Primary open-angle glaucoma, bilateral, stage unspecified: Secondary | ICD-10-CM

## 2023-04-23 DIAGNOSIS — Z7982 Long term (current) use of aspirin: Secondary | ICD-10-CM

## 2023-04-23 DIAGNOSIS — M1990 Unspecified osteoarthritis, unspecified site: Secondary | ICD-10-CM

## 2023-04-23 DIAGNOSIS — M109 Gout, unspecified: Secondary | ICD-10-CM

## 2023-04-23 DIAGNOSIS — D631 Anemia in chronic kidney disease: Secondary | ICD-10-CM

## 2023-04-23 DIAGNOSIS — K219 Gastro-esophageal reflux disease without esophagitis: Secondary | ICD-10-CM

## 2023-04-23 DIAGNOSIS — Z9181 History of falling: Secondary | ICD-10-CM

## 2023-04-23 DIAGNOSIS — H547 Unspecified visual loss: Secondary | ICD-10-CM

## 2023-04-23 DIAGNOSIS — G8929 Other chronic pain: Secondary | ICD-10-CM | POA: Diagnosis not present

## 2023-04-23 DIAGNOSIS — I700 Atherosclerosis of aorta: Secondary | ICD-10-CM | POA: Diagnosis not present

## 2023-04-23 DIAGNOSIS — R911 Solitary pulmonary nodule: Secondary | ICD-10-CM

## 2023-04-24 ENCOUNTER — Telehealth: Payer: Self-pay | Admitting: Internal Medicine

## 2023-04-24 DIAGNOSIS — I700 Atherosclerosis of aorta: Secondary | ICD-10-CM | POA: Diagnosis not present

## 2023-04-24 DIAGNOSIS — E1122 Type 2 diabetes mellitus with diabetic chronic kidney disease: Secondary | ICD-10-CM | POA: Diagnosis not present

## 2023-04-24 DIAGNOSIS — M545 Low back pain, unspecified: Secondary | ICD-10-CM | POA: Diagnosis not present

## 2023-04-24 DIAGNOSIS — N189 Chronic kidney disease, unspecified: Secondary | ICD-10-CM | POA: Diagnosis not present

## 2023-04-24 DIAGNOSIS — E1151 Type 2 diabetes mellitus with diabetic peripheral angiopathy without gangrene: Secondary | ICD-10-CM | POA: Diagnosis not present

## 2023-04-24 DIAGNOSIS — I129 Hypertensive chronic kidney disease with stage 1 through stage 4 chronic kidney disease, or unspecified chronic kidney disease: Secondary | ICD-10-CM | POA: Diagnosis not present

## 2023-04-24 DIAGNOSIS — G8929 Other chronic pain: Secondary | ICD-10-CM | POA: Diagnosis not present

## 2023-04-24 DIAGNOSIS — M25512 Pain in left shoulder: Secondary | ICD-10-CM | POA: Diagnosis not present

## 2023-04-24 DIAGNOSIS — I2510 Atherosclerotic heart disease of native coronary artery without angina pectoris: Secondary | ICD-10-CM | POA: Diagnosis not present

## 2023-04-24 NOTE — Telephone Encounter (Signed)
Called Breanna [name] no answer LMOM ok w/verbal. Pt is in good compliance w/ appts.Marland KitchenRaechel Chute

## 2023-04-24 NOTE — Telephone Encounter (Signed)
Caller & What Company:     Phone Number:   Needs Verbal orders for what service & frequency:

## 2023-04-24 NOTE — Telephone Encounter (Signed)
HH ORDERS   Caller Name: [facility] Agency Name: [facility] Callback Phone #: [phone](secure)  Service Requested: Extend PT (examples: OT/PT/Skilled Nursing/Social Work/Speech Therapy/Wound Care)  Frequency of Visits: 1X a week for 4 weeks   Enrique Sack also requested OT to help with patient's shoulder strength.

## 2023-04-26 DIAGNOSIS — Z9181 History of falling: Secondary | ICD-10-CM | POA: Diagnosis not present

## 2023-04-26 DIAGNOSIS — R609 Edema, unspecified: Secondary | ICD-10-CM | POA: Diagnosis not present

## 2023-04-26 DIAGNOSIS — K219 Gastro-esophageal reflux disease without esophagitis: Secondary | ICD-10-CM | POA: Diagnosis not present

## 2023-04-26 DIAGNOSIS — M109 Gout, unspecified: Secondary | ICD-10-CM | POA: Diagnosis not present

## 2023-04-26 DIAGNOSIS — Z885 Allergy status to narcotic agent status: Secondary | ICD-10-CM | POA: Diagnosis not present

## 2023-04-26 DIAGNOSIS — H409 Unspecified glaucoma: Secondary | ICD-10-CM | POA: Diagnosis not present

## 2023-04-26 DIAGNOSIS — Z7984 Long term (current) use of oral hypoglycemic drugs: Secondary | ICD-10-CM | POA: Diagnosis not present

## 2023-04-26 DIAGNOSIS — I10 Essential (primary) hypertension: Secondary | ICD-10-CM | POA: Diagnosis not present

## 2023-04-26 DIAGNOSIS — M1990 Unspecified osteoarthritis, unspecified site: Secondary | ICD-10-CM | POA: Diagnosis not present

## 2023-04-26 DIAGNOSIS — E1151 Type 2 diabetes mellitus with diabetic peripheral angiopathy without gangrene: Secondary | ICD-10-CM | POA: Diagnosis not present

## 2023-04-26 DIAGNOSIS — Z888 Allergy status to other drugs, medicaments and biological substances status: Secondary | ICD-10-CM | POA: Diagnosis not present

## 2023-04-26 DIAGNOSIS — F419 Anxiety disorder, unspecified: Secondary | ICD-10-CM | POA: Diagnosis not present

## 2023-04-26 DIAGNOSIS — K5900 Constipation, unspecified: Secondary | ICD-10-CM | POA: Diagnosis not present

## 2023-04-26 DIAGNOSIS — R001 Bradycardia, unspecified: Secondary | ICD-10-CM | POA: Diagnosis not present

## 2023-04-26 DIAGNOSIS — Z96649 Presence of unspecified artificial hip joint: Secondary | ICD-10-CM | POA: Diagnosis not present

## 2023-04-26 DIAGNOSIS — F329 Major depressive disorder, single episode, unspecified: Secondary | ICD-10-CM | POA: Diagnosis not present

## 2023-04-26 DIAGNOSIS — H547 Unspecified visual loss: Secondary | ICD-10-CM | POA: Diagnosis not present

## 2023-04-26 DIAGNOSIS — R32 Unspecified urinary incontinence: Secondary | ICD-10-CM | POA: Diagnosis not present

## 2023-05-01 ENCOUNTER — Telehealth: Payer: Self-pay

## 2023-05-01 DIAGNOSIS — I2510 Atherosclerotic heart disease of native coronary artery without angina pectoris: Secondary | ICD-10-CM | POA: Diagnosis not present

## 2023-05-01 DIAGNOSIS — N189 Chronic kidney disease, unspecified: Secondary | ICD-10-CM | POA: Diagnosis not present

## 2023-05-01 DIAGNOSIS — I129 Hypertensive chronic kidney disease with stage 1 through stage 4 chronic kidney disease, or unspecified chronic kidney disease: Secondary | ICD-10-CM | POA: Diagnosis not present

## 2023-05-01 DIAGNOSIS — M545 Low back pain, unspecified: Secondary | ICD-10-CM | POA: Diagnosis not present

## 2023-05-01 DIAGNOSIS — E1151 Type 2 diabetes mellitus with diabetic peripheral angiopathy without gangrene: Secondary | ICD-10-CM | POA: Diagnosis not present

## 2023-05-01 DIAGNOSIS — I700 Atherosclerosis of aorta: Secondary | ICD-10-CM | POA: Diagnosis not present

## 2023-05-01 DIAGNOSIS — G8929 Other chronic pain: Secondary | ICD-10-CM | POA: Diagnosis not present

## 2023-05-01 DIAGNOSIS — E1122 Type 2 diabetes mellitus with diabetic chronic kidney disease: Secondary | ICD-10-CM | POA: Diagnosis not present

## 2023-05-01 DIAGNOSIS — M25512 Pain in left shoulder: Secondary | ICD-10-CM | POA: Diagnosis not present

## 2023-05-01 NOTE — Telephone Encounter (Signed)
Notified Breanna [name] ok for verbal. Pt is in good compliance w/ appt.Marland KitchenRaechel Chute

## 2023-05-01 NOTE — Telephone Encounter (Signed)
Breanna [name] with [facility] needs a verbal OK to move OT eval to next week.  Breanna [name] [phone].

## 2023-05-13 ENCOUNTER — Other Ambulatory Visit: Payer: Self-pay | Admitting: *Deleted

## 2023-05-13 ENCOUNTER — Other Ambulatory Visit: Payer: Self-pay | Admitting: Internal Medicine

## 2023-05-13 NOTE — Telephone Encounter (Signed)
Rec'd fax pt requesting refill on Amlodipine. Med has not been refilled by MD pls advise.Marland KitchenRaechel Chute

## 2023-05-14 ENCOUNTER — Ambulatory Visit: Payer: Medicare PPO | Admitting: Internal Medicine

## 2023-05-14 MED ORDER — AMLODIPINE BESYLATE 5 MG PO TABS: 5.0000 mg | ORAL_TABLET | Freq: Every day | ORAL | 3 refills | Status: DC

## 2023-05-27 DIAGNOSIS — H401133 Primary open-angle glaucoma, bilateral, severe stage: Secondary | ICD-10-CM | POA: Diagnosis not present

## 2023-05-28 ENCOUNTER — Ambulatory Visit: Payer: Medicare PPO | Admitting: Internal Medicine

## 2023-05-28 ENCOUNTER — Encounter: Payer: Self-pay | Admitting: Internal Medicine

## 2023-05-28 VITALS — BP 168/80 | HR 48 | Temp 98.8°F | Ht 62.0 in | Wt 114.0 lb

## 2023-05-28 DIAGNOSIS — R296 Repeated falls: Secondary | ICD-10-CM

## 2023-05-28 DIAGNOSIS — H538 Other visual disturbances: Secondary | ICD-10-CM

## 2023-05-28 DIAGNOSIS — M79605 Pain in left leg: Secondary | ICD-10-CM | POA: Diagnosis not present

## 2023-05-28 MED ORDER — LOSARTAN POTASSIUM 100 MG PO TABS: 100.0000 mg | ORAL_TABLET | Freq: Every day | ORAL | 3 refills | Status: DC

## 2023-05-28 NOTE — Patient Instructions (Signed)
Make sure to be careful. We have sent in the 100 mg losartan

## 2023-05-28 NOTE — Progress Notes (Unsigned)
   Subjective:   Patient ID: Breanna [name], female    DOB: 12/14/1931, 89 y.o.   MRN: [id number]  HPI The patient is an 89 YO female with severe visual impairment coming in for fall. She was walking in her home and stumbled into something and fell. Hit head no LOC and bruising on the left leg. Denies pain and able to walk since. This happened about 3 days ago. Son present with her and helps to provide history.   Review of Systems  Constitutional: Negative.   HENT: Negative.    Eyes:  Positive for visual disturbance.  Respiratory:  Negative for cough, chest tightness and shortness of breath.   Cardiovascular:  Negative for chest pain, palpitations and leg swelling.  Gastrointestinal:  Negative for abdominal distention, abdominal pain, constipation, diarrhea, nausea and vomiting.  Musculoskeletal:  Positive for arthralgias, gait problem and myalgias.  Skin: Negative.   Psychiatric/Behavioral: Negative.      Objective:  Physical Exam Constitutional:      Appearance: She is well-developed.  HENT:     Head: Normocephalic and atraumatic.  Eyes:     Comments: Visually impaired  Cardiovascular:     Rate and Rhythm: Normal rate and regular rhythm.  Pulmonary:     Effort: Pulmonary effort is normal. No respiratory distress.     Breath sounds: Normal breath sounds. No wheezing or rales.  Abdominal:     General: Bowel sounds are normal. There is no distension.     Palpations: Abdomen is soft.     Tenderness: There is no abdominal tenderness. There is no rebound.  Musculoskeletal:     Cervical back: Normal range of motion.  Skin:    General: Skin is warm and dry.     Comments: Bruising on pain left lower leg without skin breakdown. Small area left scalp with mild tenderness barely any swelling or prominence  Neurological:     Mental Status: She is alert and oriented to person, place, and time.     Coordination: Coordination abnormal.     Comments: Uses cane to walk and visual  impairment affects balance     Vitals:   05/28/23 1427 05/28/23 1441  BP: (!) 170/78 (!) 168/80  Pulse: (!) 48   Temp: 98.8 F (37.1 C)   TempSrc: Oral   SpO2: 97%   Weight: 114 lb (51.7 kg)   Height: 5\' 2"  (1.575 m)     Assessment & Plan:  Visit time 20 minutes in face to face communication with patient and coordination of care, additional 10 minutes spent in record review, coordination or care, ordering tests, communicating/referring to other healthcare professionals, documenting in medical records all on the same day of the visit for total time 30 minutes spent on the visit.

## 2023-05-29 ENCOUNTER — Ambulatory Visit (HOSPITAL_BASED_OUTPATIENT_CLINIC_OR_DEPARTMENT_OTHER): Payer: Medicare PPO | Admitting: Orthopaedic Surgery

## 2023-05-29 NOTE — Assessment & Plan Note (Signed)
With some bruising after fall. Can use heat or ice on the area and voltaren or lidocaine patches which she has at home.

## 2023-05-29 NOTE — Assessment & Plan Note (Signed)
Likely cause of fall with visual impairment. She is using cane in her home. Son makes sure no rugs, cords or objects in her path. She is able to get around in her own home. Family helps with meals and she does not cook. No LOC and head mildly tender. No indication for imaging.

## 2023-05-29 NOTE — Assessment & Plan Note (Signed)
No serious injury and she was not dizzy at time of fall. BP running normal at home. Elevated today due to not taking medicine prior to visit. Visual impairment contributed to fall. Using cane in the home and she will work on safety with son.

## 2023-06-13 NOTE — Progress Notes (Deleted)
Office Visit    Patient Name: Breanna [name] Date of Encounter: 06/13/2023  Primary Care Provider:  Tresa Garter, MD Primary Cardiologist:  None Primary Electrophysiologist: None   Past Medical History    Past Medical History:  Diagnosis Date   Adrenal adenoma 2006   Boils 2009   Cholelithiasis    asympt. w/normal HIDA 03/2010   CVA (cerebral infarction) 2010   Cerebellar   Diverticulitis    Esophageal spasm 2011   GERD (gastroesophageal reflux disease)    Gout    History of colon polyps    HTN (hypertension)    Hydronephrosis    LEFT/ Surgical intervention   Hyperlipidemia    LBP (low back pain)    Osteoarthritis    Pulmonary HTN (HCC)    Stress    Type II or unspecified type diabetes mellitus without mention of complication, not stated as uncontrolled    Past Surgical History:  Procedure Laterality Date   ABDOMINAL HYSTERECTOMY     APPENDECTOMY     2020   BACK SURGERY     BREAST BIOPSY     RIGHT   CATARACT EXTRACTION, BILATERAL     COLECTOMY  2006   Sigmoid   FOOT SURGERY     BILATERAL   HAMMER TOE SURGERY     INTRAMEDULLARY (IM) NAIL INTERTROCHANTERIC Right 07/21/2020   Procedure: INTRAMEDULLARY (IM) NAIL INTERTROCHANTRIC;  Surgeon: Sheral Apley, MD;  Location: [facility] OR;  Service: Orthopedics;  Laterality: Right;   ROTATOR CUFF REPAIR  2008   RIGHT   TOTAL KNEE ARTHROPLASTY  2003   LEFT   VARICOSE VEIN SURGERY     vein stripping/lower extremities    Allergies  Allergies  Allergen Reactions   Verapamil Shortness Of Breath    REACTION: SOB   Aspirin Itching    But tolerates low dose   Atenolol     REACTION: fatigue   Codeine Itching   Codeine Sulfate    Hydrochlorothiazide     REACTION: gout   Hydrocodone       side effects - hallucinations   Ibuprofen     Upset stomach w/high doses   Lisinopril     REACTION: cough   Onion Other (See Comments)    Dry mouth/ gets sores   Shellfish Allergy Swelling    Patient stated she  does not eat shellfish, she swells up on different parts of the body   Statins     Dizzy, falls   Valsartan Itching   Adhesive  [Tape] Rash   Other Rash     History of Present Illness    Breanna [name] is a 89 y.o. female with PMH of coronary artery calcifications, cerebellar CVA 2010, GERD, DM type II, CKD stage III, HTN, HLD, GERD who presents today for 85-month follow-up.  [name] has a history of coronary calcifications that were noted on a previous chest CT in 2018. She presented to the ED on 02/28/2023 with complaint of sharp radiating chest and shoulder pain. She was admitted and troponins were minimally minimally elevated and flat at 45 with blood pressures elevated in the 170s over 80s. Shoulder x-ray showed mild superior subluxation of humeral head relative to rotator cuff tear. 2D echo was completed with EF of 55 to 60% and no RWMA with grade 2 DD. D-dimer was elevated and chest CT was completed that showed no acute changes or PEs.  She was seen in follow-up on 03/13/2023 and blood pressures  were elevated at 146/72 and 142/73.  She was continued on current regimen and advised to check blood pressures and report back in 2 weeks.  She underwent a Lexiscan Myoview due to abnormal troponins to rule out ischemia that showed small area of ischemia with recommendations to continue medical therapy for now.  She was started on Crestor 5 mg but was intolerant due to dizziness.  She was seen recently 05/28/2023 and reported visual impairment and a fall hitting her head but no loss of consciousness.  BP was noted to be 168/80 and pulse was 48 bpm.    Since last being seen in the office patient reports***.  Patient denies chest pain, palpitations, dyspnea, PND, orthopnea, nausea, vomiting, dizziness, syncope, edema, weight gain, or early satiety.     ***Notes:  Home Medications    Current Outpatient Medications  Medication Sig Dispense Refill   acetaminophen (TYLENOL) 325 MG tablet Take 2  tablets (650 mg total) by mouth every 6 (six) hours as needed for moderate pain or fever.     Alcohol Swabs (TRUE COMFORT ALCOHOL PREP PADS) 70 % PADS Use as directed 300 each 1   allopurinol (ZYLOPRIM) 100 MG tablet TAKE 1 TABLET BY MOUTH EVERY DAY 90 tablet 1   amLODipine (NORVASC) 5 MG tablet Take 1 tablet (5 mg total) by mouth daily. 90 tablet 3   ascorbic acid (VITAMIN C) 500 MG tablet TAKE 1 TABLET BY MOUTH EVERY DAY 30 tablet 11   ASPIRIN LOW DOSE 81 MG tablet TAKE 1 TABLET BY MOUTH DAILY. SWALLOW WHOLE. 90 tablet 3   atropine 1 % ophthalmic solution Place 1 drop into both eyes daily.     Blood Glucose Calibration (TRUE METRIX LEVEL 1) Low SOLN USE AS DIRECTED 3 each 3   brimonidine (ALPHAGAN) 0.2 % ophthalmic solution Place 1 drop into the right eye 2 (two) times daily.     calcium carbonate (OSCAL) 1500 (600 Ca) MG TABS tablet TAKE 1 TABLET BY MOUTH EVERY DAY 90 tablet 3   Cholecalciferol (VITAMIN D3) 25 MCG (1000 UT) CAPS Take 1 capsule (1,000 Units total) by mouth daily. 90 capsule 3   [employer] VITAMIN B12 1000 MCG tablet TAKE 1 TABLET (1,000 MCG TOTAL) BY MOUTH EVERY DAY 90 tablet 3   diclofenac sodium (VOLTAREN) 1 % GEL Apply 2 g topically 3 (three) times daily. To left shoulder 200 g 3   docusate sodium (COLACE) 100 MG capsule TAKE 1 CAPSULE BY MOUTH TWICE A DAY 180 capsule 3   dorzolamide-timolol (COSOPT) 22.3-6.8 MG/ML ophthalmic solution Place 1 drop into both eyes 2 (two) times daily.      feeding supplement, ENSURE ENLIVE, (ENSURE ENLIVE) LIQD Take 237 mLs by mouth daily at 2 PM. 237 mL 12   gabapentin (NEURONTIN) 100 MG capsule TAKE 1 CAPSULE BY MOUTH THREE TIMES A DAY 90 capsule 5   glucose blood (GNP TRUE METRIX GLUCOSE STRIPS) test strip Use to check blood sugars twice a day 200 each 2   latanoprost (XALATAN) 0.005 % ophthalmic solution Place 1 drop into both eyes at bedtime.      lidocaine (LIDODERM) 5 % Place 1 patch onto the skin daily. Remove & Discard patch within 12 hours  or as directed by MD. Apply to left shoulder 30 patch 0   lidocaine-prilocaine (EMLA) cream Apply 1 application topically as needed. 30 g 0   losartan (COZAAR) 100 MG tablet Take 1 tablet (100 mg total) by mouth daily. 90 tablet 3  metFORMIN (GLUCOPHAGE) 500 MG tablet Take 1 tablet (500 mg total) by mouth daily with breakfast. 90 tablet 1   Netarsudil-Latanoprost 0.02-0.005 % SOLN Apply to eye.     NON FORMULARY Place 10 drops under the tongue daily as needed (Pain). Medication: CBD oil     pantoprazole (PROTONIX) 40 MG tablet TAKE 1 TABLET BY MOUTH EVERY DAY 90 tablet 3   polyethylene glycol (MIRALAX / GLYCOLAX) 17 g packet Take 17 g by mouth daily as needed for mild constipation. 14 each 0   senna (SENOKOT) 8.6 MG TABS tablet Take 1 tablet (8.6 mg total) by mouth 2 (two) times daily. 120 tablet 0   sertraline (ZOLOFT) 100 MG tablet TAKE 1 TABLET BY MOUTH EVERY DAY 90 tablet 1   spironolactone (ALDACTONE) 25 MG tablet TAKE 1 TAB BY MOUTH MON - WED - FRI 90 tablet 1   traMADol (ULTRAM) 50 MG tablet Take 1 tablet (50 mg total) by mouth every 8 (eight) hours as needed for severe pain. 30 tablet 0   TRUEplus Lancets 33G MISC USE AS DIRECTED TO CHECK BLOOD SUGAR TWICE A DAY 200 each 1   valACYclovir (VALTREX) 500 MG tablet Take 1 tablet (500 mg total) by mouth daily as needed (for outbreaks).     No current facility-administered medications for this visit.   Facility-Administered Medications Ordered in Other Visits  Medication Dose Route Frequency Provider Last Rate Last Admin   regadenoson (LEXISCAN) injection SOLN 0.4 mg  0.4 mg Intravenous Once Meriam Sprague, MD       technetium tetrofosmin (TC-MYOVIEW) injection 31.1 millicurie  31.1 millicurie Intravenous Once PRN Meriam Sprague, MD         Review of Systems  Please see the history of present illness.    (+)*** (+)***  All other systems reviewed and are otherwise negative except as noted above.  Physical Exam    Wt  Readings from Last 3 Encounters:  05/28/23 114 lb (51.7 kg)  04/11/23 113 lb (51.3 kg)  04/03/23 119 lb (54 kg)   ZO:XWRUE were no vitals filed for this visit.,There is no height or weight on file to calculate BMI.  Constitutional:      Appearance: Healthy appearance. Not in distress.  Neck:     Vascular: JVD normal.  Pulmonary:     Effort: Pulmonary effort is normal.     Breath sounds: No wheezing. No rales. Diminished in the bases Cardiovascular:     Normal rate. Regular rhythm. Normal S1. Normal S2.      Murmurs: There is no murmur.  Edema:    Peripheral edema absent.  Abdominal:     Palpations: Abdomen is soft non tender. There is no hepatomegaly.  Skin:    General: Skin is warm and dry.  Neurological:     General: No focal deficit present.     Mental Status: Alert and oriented to person, place and time.     Cranial Nerves: Cranial nerves are intact.  EKG/LABS/ Recent Cardiac Studies    ECG personally reviewed by me today - ***  Cardiac Studies & Procedures     STRESS TESTS  MYOCARDIAL PERFUSION IMAGING 03/27/2023  Narrative   The study is low risk. There is a small region of apical ischemia.   No ST deviation was noted.   LV perfusion is abnormal. There is a small defect with mild reduction in uptake present in the apex location(s) that is reversible. Findings suggestive of small region of ischemia.  Consistent with ischemia.   Left ventricular function is normal. Nuclear stress EF: 58 %. The left ventricular ejection fraction is normal (55-65%). End diastolic cavity size is normal. End systolic cavity size is normal.   Prior study not available for comparison.   ECHOCARDIOGRAM  ECHOCARDIOGRAM COMPLETE 03/01/2023  Narrative ECHOCARDIOGRAM REPORT    Patient Name:   SHAQUASHA KWOK Date of Exam: 03/01/2023 Medical Rec #:  [id number]       Height:       62.0 in Accession #:    [id number]      Weight:       113.8 lb Date of Birth:  Mar 14, 1931        BSA:           1.504 m Patient Age:    89 years        BP:           147/70 mmHg Patient Gender: F               HR:           51 bpm. Exam Location:  Inpatient  Procedure: 2D Echo, Cardiac Doppler and Color Doppler  Indications:    elevated troponin  History:        Patient has no prior history of Echocardiogram examinations. Stroke; Risk Factors:Hypertension and Dyslipidemia.  Sonographer:    Mike Gip Referring Phys: [id number] JUSTIN B HOWERTER  IMPRESSIONS   1. Left ventricular ejection fraction, by estimation, is 55 to 60%. The left ventricle has normal function. The left ventricle has no regional wall motion abnormalities. Left ventricular diastolic parameters are consistent with Grade II diastolic dysfunction (pseudonormalization). 2. Right ventricular systolic function is normal. The right ventricular size is normal. There is mildly elevated pulmonary artery systolic pressure. The estimated right ventricular systolic pressure is 39.7 mmHg. 3. Left atrial size was moderately dilated. 4. The mitral valve is degenerative. Mild to moderate mitral valve regurgitation. No evidence of mitral stenosis. 5. Tricuspid valve regurgitation is moderate. 6. The aortic valve is tricuspid. There is moderate calcification of the aortic valve. Aortic valve regurgitation is trivial. No aortic stenosis is present. 7. The inferior vena cava is dilated in size with >50% respiratory variability, suggesting right atrial pressure of 8 mmHg.  FINDINGS Left Ventricle: Left ventricular ejection fraction, by estimation, is 55 to 60%. The left ventricle has normal function. The left ventricle has no regional wall motion abnormalities. The left ventricular internal cavity size was normal in size. There is no left ventricular hypertrophy. Left ventricular diastolic parameters are consistent with Grade II diastolic dysfunction (pseudonormalization).  Right Ventricle: The right ventricular size is normal. No increase in  right ventricular wall thickness. Right ventricular systolic function is normal. There is mildly elevated pulmonary artery systolic pressure. The tricuspid regurgitant velocity is 3.03 m/s, and with an assumed right atrial pressure of 3 mmHg, the estimated right ventricular systolic pressure is 39.7 mmHg.  Left Atrium: Left atrial size was moderately dilated.  Right Atrium: Right atrial size was normal in size.  Pericardium: There is no evidence of pericardial effusion.  Mitral Valve: The mitral valve is degenerative in appearance. Mild to moderate mitral valve regurgitation, with posteriorly-directed jet. No evidence of mitral valve stenosis.  Tricuspid Valve: The tricuspid valve is normal in structure. Tricuspid valve regurgitation is moderate . No evidence of tricuspid stenosis.  Aortic Valve: The aortic valve is tricuspid. There is moderate calcification of the aortic valve. Aortic valve regurgitation is trivial.  Aortic regurgitation PHT measures 763 msec. No aortic stenosis is present.  Pulmonic Valve: The pulmonic valve was not well visualized. Pulmonic valve regurgitation is trivial. No evidence of pulmonic stenosis.  Aorta: The aortic root is normal in size and structure.  Venous: The inferior vena cava is dilated in size with greater than 50% respiratory variability, suggesting right atrial pressure of 8 mmHg.  IAS/Shunts: No atrial level shunt detected by color flow Doppler.   LEFT VENTRICLE PLAX 2D LVIDd:         4.20 cm      Diastology LVIDs:         2.90 cm      LV e' medial:    4.05 cm/s LV PW:         1.30 cm      LV E/e' medial:  18.1 LV IVS:        1.40 cm      LV e' lateral:   5.43 cm/s LVOT diam:     1.90 cm      LV E/e' lateral: 13.5 LV SV:         64 LV SV Index:   42 LVOT Area:     2.84 cm  LV Volumes (MOD) LV vol d, MOD A2C: 128.0 ml LV vol d, MOD A4C: 110.0 ml LV vol s, MOD A2C: 51.8 ml LV vol s, MOD A4C: 38.1 ml LV SV MOD A2C:     76.2 ml LV SV MOD  A4C:     110.0 ml LV SV MOD BP:      76.8 ml  RIGHT VENTRICLE             IVC RV Basal diam:  3.90 cm     IVC diam: 2.10 cm RV S prime:     11.10 cm/s TAPSE (M-mode): 1.7 cm  LEFT ATRIUM             Index        RIGHT ATRIUM           Index LA diam:        3.80 cm 2.53 cm/m   RA Area:     12.70 cm LA Vol (A2C):   62.6 ml 41.62 ml/m  RA Volume:   26.30 ml  17.49 ml/m LA Vol (A4C):   81.0 ml 53.85 ml/m LA Biplane Vol: 75.6 ml 50.26 ml/m AORTIC VALVE LVOT Vmax:   96.00 cm/s LVOT Vmean:  60.100 cm/s LVOT VTI:    0.225 m AI PHT:      763 msec  AORTA Ao Root diam: 3.00 cm Ao Asc diam:  2.70 cm  MITRAL VALVE                  TRICUSPID VALVE MV Area (PHT): 3.03 cm       TR Peak grad:   36.7 mmHg MV Decel Time: 250 msec       TR Vmax:        303.00 cm/s MR Peak grad:    159.3 mmHg MR Mean grad:    99.0 mmHg    SHUNTS MR Vmax:         631.00 cm/s  Systemic VTI:  0.22 m MR Vmean:        469.0 cm/s   Systemic Diam: 1.90 cm MR PISA:         1.57 cm MR PISA Eff ROA: 8 mm MR PISA Radius:  0.50 cm MV E velocity: 73.20 cm/s MV  A velocity: 51.90 cm/s MV E/A ratio:  1.41  Arvilla Meres MD Electronically signed by Arvilla Meres MD Signature Date/Time: 03/01/2023/9:39:08 AM    Final             Risk Assessment/Calculations:   {Does this patient have ATRIAL FIBRILLATION?:[id number]}        Lab Results  Component Value Date   WBC 4.1 03/02/2023   HGB 9.9 (L) 03/02/2023   HCT 31.0 (L) 03/02/2023   MCV 88.6 03/02/2023   PLT 121 (L) 03/02/2023   Lab Results  Component Value Date   CREATININE 1.19 04/11/2023   BUN 24 (H) 04/11/2023   NA 141 04/11/2023   K 5.1 04/11/2023   CL 105 04/11/2023   CO2 29 04/11/2023   Lab Results  Component Value Date   ALT 9 04/11/2023   AST 20 04/11/2023   ALKPHOS 94 04/11/2023   BILITOT 0.3 04/11/2023   Lab Results  Component Value Date   CHOL 127 03/01/2023   HDL 53 03/01/2023   LDLCALC 66 03/01/2023   TRIG 42  03/01/2023   CHOLHDL 2.4 03/01/2023    Lab Results  Component Value Date   HGBA1C 6.2 02/13/2023     Assessment & Plan    1.  Atypical chest pain: -Patient was seen in the ED for complaint of shoulder pain and chest discomfort that was related to rotator cuff tear. -Today patient reports no recurrence of chest discomfort since her previous visit.  She was noted to have abnormal troponins during ED visit. -We will have her complete a Lexiscan Myoview to evaluate for possible ischemia -Continue GDMT with ASA 81 mg and amlodipine 5 mg daily   2.  Exertional fatigue: -Today patient reports that she is feeling well with no complaints today. -She is currently participating in physical therapy for her rotator cuff tear.   3.  Essential hypertension: -Patient's blood pressure today is slightly above goal of less than 140/80.  Blood pressure today was 146/72 and we will have patient document blood pressures over the next 2 weeks and report back to the office. -Continue spironolactone Monday, Wednesday, Friday, losartan 100 mg daily and amlodipine 5 mg daily   4.  Coronary artery calcification: -Most recent CT of the chest revealed aortic calcification and an incidental finding of hyperdense lesion on left kidney   5.  DM type II: -Patient's last hemoglobin A1c was 6.2% -Continue current glycemic regimen per PCP   6.  CKD stage III: -Patient had incidental finding of left kidney mass and will elect to have referral placed by PCP at this time      Disposition: Follow-up with None or APP in *** months {Are you ordering a CV Procedure (e.g. stress test, cath, DCCV, TEE, etc)?   Press F2        :[id number]}   Medication Adjustments/Labs and Tests Ordered: Current medicines are reviewed at length with the patient today.  Concerns regarding medicines are outlined above.   Signed, Napoleon Form, Leodis Rains, NP 06/13/2023, 12:21 PM [facility]

## 2023-06-14 ENCOUNTER — Ambulatory Visit: Payer: Medicare PPO | Admitting: Internal Medicine

## 2023-06-14 ENCOUNTER — Ambulatory Visit: Payer: Medicare PPO | Attending: Nurse Practitioner | Admitting: Nurse Practitioner

## 2023-06-14 ENCOUNTER — Encounter: Payer: Self-pay | Admitting: Internal Medicine

## 2023-06-14 ENCOUNTER — Ambulatory Visit (INDEPENDENT_AMBULATORY_CARE_PROVIDER_SITE_OTHER): Payer: Medicare PPO

## 2023-06-14 VITALS — BP 168/84 | HR 50 | Temp 98.0°F | Ht 62.0 in | Wt 115.0 lb

## 2023-06-14 DIAGNOSIS — S80812A Abrasion, left lower leg, initial encounter: Secondary | ICD-10-CM

## 2023-06-14 DIAGNOSIS — S80819A Abrasion, unspecified lower leg, initial encounter: Secondary | ICD-10-CM | POA: Insufficient documentation

## 2023-06-14 DIAGNOSIS — R296 Repeated falls: Secondary | ICD-10-CM | POA: Diagnosis not present

## 2023-06-14 DIAGNOSIS — I10 Essential (primary) hypertension: Secondary | ICD-10-CM

## 2023-06-14 DIAGNOSIS — R0789 Other chest pain: Secondary | ICD-10-CM

## 2023-06-14 DIAGNOSIS — M79605 Pain in left leg: Secondary | ICD-10-CM | POA: Diagnosis not present

## 2023-06-14 DIAGNOSIS — M25572 Pain in left ankle and joints of left foot: Secondary | ICD-10-CM | POA: Insufficient documentation

## 2023-06-14 DIAGNOSIS — H547 Unspecified visual loss: Secondary | ICD-10-CM | POA: Diagnosis not present

## 2023-06-14 DIAGNOSIS — E1121 Type 2 diabetes mellitus with diabetic nephropathy: Secondary | ICD-10-CM

## 2023-06-14 DIAGNOSIS — R5383 Other fatigue: Secondary | ICD-10-CM

## 2023-06-14 DIAGNOSIS — N1832 Chronic kidney disease, stage 3b: Secondary | ICD-10-CM

## 2023-06-14 MED ORDER — MUPIROCIN 2 % EX OINT: TOPICAL_OINTMENT | CUTANEOUS | 0 refills | Status: DC

## 2023-06-14 NOTE — Assessment & Plan Note (Signed)
X ray

## 2023-06-14 NOTE — Assessment & Plan Note (Signed)
Bactroban oint; Band-Aid applied

## 2023-06-14 NOTE — Progress Notes (Signed)
Subjective:  Patient ID: Breanna [name], female    DOB: 1931/06/17  Age: 89 y.o. MRN: [id number]  CC: Fall (Lt leg/shin abrasion, swelling, redness and pain )   HPI [address] presents for a fall 2 weeks ago, bumped into [employer] C/o L lower leg pain  L knee w/a scar  Outpatient Medications Prior to Visit  Medication Sig Dispense Refill  . acetaminophen (TYLENOL) 325 MG tablet Take 2 tablets (650 mg total) by mouth every 6 (six) hours as needed for moderate pain or fever.    . Alcohol Swabs (TRUE COMFORT ALCOHOL PREP PADS) 70 % PADS Use as directed 300 each 1  . allopurinol (ZYLOPRIM) 100 MG tablet TAKE 1 TABLET BY MOUTH EVERY DAY 90 tablet 1  . amLODipine (NORVASC) 5 MG tablet Take 1 tablet (5 mg total) by mouth daily. 90 tablet 3  . ascorbic acid (VITAMIN C) 500 MG tablet TAKE 1 TABLET BY MOUTH EVERY DAY 30 tablet 11  . ASPIRIN LOW DOSE 81 MG tablet TAKE 1 TABLET BY MOUTH DAILY. SWALLOW WHOLE. 90 tablet 3  . atropine 1 % ophthalmic solution Place 1 drop into both eyes daily.    . Blood Glucose Calibration (TRUE METRIX LEVEL 1) Low SOLN USE AS DIRECTED 3 each 3  . brimonidine (ALPHAGAN) 0.2 % ophthalmic solution Place 1 drop into the right eye 2 (two) times daily.    . calcium carbonate (OSCAL) 1500 (600 Ca) MG TABS tablet TAKE 1 TABLET BY MOUTH EVERY DAY 90 tablet 3  . Cholecalciferol (VITAMIN D3) 25 MCG (1000 UT) CAPS Take 1 capsule (1,000 Units total) by mouth daily. 90 capsule 3  . [employer] VITAMIN B12 1000 MCG tablet TAKE 1 TABLET (1,000 MCG TOTAL) BY MOUTH EVERY DAY 90 tablet 3  . diclofenac sodium (VOLTAREN) 1 % GEL Apply 2 g topically 3 (three) times daily. To left shoulder 200 g 3  . docusate sodium (COLACE) 100 MG capsule TAKE 1 CAPSULE BY MOUTH TWICE A DAY 180 capsule 3  . dorzolamide-timolol (COSOPT) 22.3-6.8 MG/ML ophthalmic solution Place 1 drop into both eyes 2 (two) times daily.     . feeding supplement, ENSURE ENLIVE, (ENSURE ENLIVE) LIQD Take 237 mLs by mouth daily at  2 PM. 237 mL 12  . gabapentin (NEURONTIN) 100 MG capsule TAKE 1 CAPSULE BY MOUTH THREE TIMES A DAY 90 capsule 5  . glucose blood (GNP TRUE METRIX GLUCOSE STRIPS) test strip Use to check blood sugars twice a day 200 each 2  . latanoprost (XALATAN) 0.005 % ophthalmic solution Place 1 drop into both eyes at bedtime.     . lidocaine (LIDODERM) 5 % Place 1 patch onto the skin daily. Remove & Discard patch within 12 hours or as directed by MD. Apply to left shoulder 30 patch 0  . lidocaine-prilocaine (EMLA) cream Apply 1 application topically as needed. 30 g 0  . losartan (COZAAR) 100 MG tablet Take 1 tablet (100 mg total) by mouth daily. 90 tablet 3  . metFORMIN (GLUCOPHAGE) 500 MG tablet Take 1 tablet (500 mg total) by mouth daily with breakfast. 90 tablet 1  . Netarsudil-Latanoprost 0.02-0.005 % SOLN Apply to eye.    . NON FORMULARY Place 10 drops under the tongue daily as needed (Pain). Medication: CBD oil    . pantoprazole (PROTONIX) 40 MG tablet TAKE 1 TABLET BY MOUTH EVERY DAY 90 tablet 3  . polyethylene glycol (MIRALAX / GLYCOLAX) 17 g packet Take 17 g by mouth daily as  needed for mild constipation. 14 each 0  . senna (SENOKOT) 8.6 MG TABS tablet Take 1 tablet (8.6 mg total) by mouth 2 (two) times daily. 120 tablet 0  . sertraline (ZOLOFT) 100 MG tablet TAKE 1 TABLET BY MOUTH EVERY DAY 90 tablet 1  . spironolactone (ALDACTONE) 25 MG tablet TAKE 1 TAB BY MOUTH MON - WED - FRI 90 tablet 1  . traMADol (ULTRAM) 50 MG tablet Take 1 tablet (50 mg total) by mouth every 8 (eight) hours as needed for severe pain. 30 tablet 0  . TRUEplus Lancets 33G MISC USE AS DIRECTED TO CHECK BLOOD SUGAR TWICE A DAY 200 each 1  . valACYclovir (VALTREX) 500 MG tablet Take 1 tablet (500 mg total) by mouth daily as needed (for outbreaks).     Facility-Administered Medications Prior to Visit  Medication Dose Route Frequency Provider Last Rate Last Admin  . regadenoson (LEXISCAN) injection SOLN 0.4 mg  0.4 mg Intravenous  Once Laurance Flatten E, MD      . technetium tetrofosmin (TC-MYOVIEW) injection 31.1 millicurie  31.1 millicurie Intravenous Once PRN Meriam Sprague, MD        ROS: Review of Systems  Objective:  BP (!) 168/84 (BP Location: Left Arm, Patient Position: Sitting, Cuff Size: Large)   Pulse (!) 50   Temp 98 F (36.7 C) (Oral)   Ht 5\' 2"  (1.575 m)   Wt 115 lb (52.2 kg)   SpO2 95%   BMI 21.03 kg/m   BP Readings from Last 3 Encounters:  06/14/23 (!) 168/84  05/28/23 (!) 168/80  04/11/23 129/80    Wt Readings from Last 3 Encounters:  06/14/23 115 lb (52.2 kg)  05/28/23 114 lb (51.7 kg)  04/11/23 113 lb (51.3 kg)    Physical Exam  LLE abrasion L ankle w/swelling, pain  Lab Results  Component Value Date   WBC 4.1 03/02/2023   HGB 9.9 (L) 03/02/2023   HCT 31.0 (L) 03/02/2023   PLT 121 (L) 03/02/2023   GLUCOSE 93 04/11/2023   CHOL 127 03/01/2023   TRIG 42 03/01/2023   HDL 53 03/01/2023   LDLCALC 66 03/01/2023   ALT 9 04/11/2023   AST 20 04/11/2023   NA 141 04/11/2023   K 5.1 04/11/2023   CL 105 04/11/2023   CREATININE 1.19 04/11/2023   BUN 24 (H) 04/11/2023   CO2 29 04/11/2023   TSH 0.64 08/24/2022   HGBA1C 6.2 02/13/2023    VAS [place] LOWER EXTREMITY VENOUS (DVT)  Result Date: 03/03/2023  Lower Venous DVT Study Patient Name:  MURLEAN SEELYE  Date of Exam:   03/01/2023 Medical Rec #: [id number]        Accession #:    [id number] Date of Birth: 1931-01-11         Patient Gender: F Patient Age:   89 years Exam Location:  [facility] Procedure:      VAS [place] LOWER EXTREMITY VENOUS (DVT) Referring Phys: RIPUDEEP RAI --------------------------------------------------------------------------------  Indications: Edema.  Risk Factors: None identified. Limitations: Poor ultrasound/tissue interface. Comparison Study: No prior studies. Performing Technologist: Chanda Busing RVT  Examination Guidelines: A complete evaluation includes B-mode imaging, spectral Doppler, color  Doppler, and power Doppler as needed of all accessible portions of each vessel. Bilateral testing is considered an integral part of a complete examination. Limited examinations for reoccurring indications may be performed as noted. The reflux portion of the exam is performed with the patient in reverse Trendelenburg.  +---------+---------------+---------+-----------+----------+--------------+ RIGHT  CompressibilityPhasicitySpontaneityPropertiesThrombus Aging +---------+---------------+---------+-----------+----------+--------------+ CFV      Full           Yes      Yes                                 +---------+---------------+---------+-----------+----------+--------------+ SFJ      Full                                                        +---------+---------------+---------+-----------+----------+--------------+ FV Prox  Full                                                        +---------+---------------+---------+-----------+----------+--------------+ FV Mid   Full                                                        +---------+---------------+---------+-----------+----------+--------------+ FV DistalFull                                                        +---------+---------------+---------+-----------+----------+--------------+ PFV      Full                                                        +---------+---------------+---------+-----------+----------+--------------+ POP      Full           Yes      Yes                                 +---------+---------------+---------+-----------+----------+--------------+ PTV      Full                                                        +---------+---------------+---------+-----------+----------+--------------+ PERO     Full                                                        +---------+---------------+---------+-----------+----------+--------------+    +---------+---------------+---------+-----------+----------+--------------+ LEFT     CompressibilityPhasicitySpontaneityPropertiesThrombus Aging +---------+---------------+---------+-----------+----------+--------------+ CFV      Full           Yes      Yes                                 +---------+---------------+---------+-----------+----------+--------------+  SFJ      Full                                                        +---------+---------------+---------+-----------+----------+--------------+ FV Prox  Full                                                        +---------+---------------+---------+-----------+----------+--------------+ FV Mid   Full                                                        +---------+---------------+---------+-----------+----------+--------------+ FV DistalFull                                                        +---------+---------------+---------+-----------+----------+--------------+ PFV      Full                                                        +---------+---------------+---------+-----------+----------+--------------+ POP      Full           Yes      Yes                                 +---------+---------------+---------+-----------+----------+--------------+ PTV      Full                                                        +---------+---------------+---------+-----------+----------+--------------+ PERO     Full                                                        +---------+---------------+---------+-----------+----------+--------------+     Summary: RIGHT: - There is no evidence of deep vein thrombosis in the lower extremity.  - No cystic structure found in the popliteal fossa.  LEFT: - There is no evidence of deep vein thrombosis in the lower extremity.  - No cystic structure found in the popliteal fossa.  *See table(s) above for measurements and observations. Electronically signed  by Heath Lark on 03/03/2023 at 4:16:14 PM.    Final    CT Angio Chest Pulmonary Embolism (PE) W or WO Contrast  Result Date: 03/01/2023 CLINICAL DATA:  Lungs are intermediate probability for PE. Positive D-dimer. EXAM: CT ANGIOGRAPHY CHEST WITH CONTRAST TECHNIQUE: Multidetector  CT imaging of the chest was performed using the standard protocol during bolus administration of intravenous contrast. Multiplanar CT image reconstructions and MIPs were obtained to evaluate the vascular anatomy. RADIATION DOSE REDUCTION: This exam was performed according to the departmental dose-optimization program which includes automated exposure control, adjustment of the mA and/or kV according to patient size and/or use of iterative reconstruction technique. CONTRAST:  75mL OMNIPAQUE IOHEXOL 350 MG/ML SOLN COMPARISON:  Chest CT 08/15/2017 FINDINGS: Cardiovascular: Heart is enlarged. Aortic is ectatic without focal dilatation. There is adequate opacification of the pulmonary arteries to the segmental level. There is no evidence for pulmonary embolism. There are atherosclerotic calcifications of the aorta. Mediastinum/Nodes: No enlarged mediastinal, hilar, or axillary lymph nodes. Esophagus and trachea are within normal limits. The thyroid gland is diffusely heterogeneous. There are calcified mediastinal and right hilar lymph nodes. Lungs/Pleura: Lungs are clear. No pleural effusion or pneumothorax. Upper Abdomen: No acute abnormality. There are calcified granulomas in the liver and spleen. Rounded mildly hyperdense area in the superior pole of the left kidney is new from prior measuring 9 mm. Musculoskeletal: The bones are osteopenic. Degenerative changes affect the spine. Review of the MIP images confirms the above findings. IMPRESSION: 1. No evidence for pulmonary embolism or other acute cardiopulmonary process. 2. Cardiomegaly. 3. New 9 mm hyperdense lesion in the superior pole of the left kidney. This may represent a hyperdense  cyst, but is indeterminate. This can be further evaluated with ultrasound. Aortic Atherosclerosis (ICD10-I70.0). Electronically Signed   By: Darliss Cheney M.D.   On: 03/01/2023 21:40   ECHOCARDIOGRAM COMPLETE  Result Date: 03/01/2023    ECHOCARDIOGRAM REPORT   Patient Name:   LORETA BLOUCH Date of Exam: 03/01/2023 Medical Rec #:  [id number]       Height:       62.0 in Accession #:    [id number]      Weight:       113.8 lb Date of Birth:  Jul 29, 1931        BSA:          1.504 m Patient Age:    89 years        BP:           147/70 mmHg Patient Gender: F               HR:           51 bpm. Exam Location:  Inpatient Procedure: 2D Echo, Cardiac Doppler and Color Doppler Indications:    elevated troponin  History:        Patient has no prior history of Echocardiogram examinations.                 Stroke; Risk Factors:Hypertension and Dyslipidemia.  Sonographer:    Mike Gip Referring Phys: [id number] JUSTIN B HOWERTER IMPRESSIONS  1. Left ventricular ejection fraction, by estimation, is 55 to 60%. The left ventricle has normal function. The left ventricle has no regional wall motion abnormalities. Left ventricular diastolic parameters are consistent with Grade II diastolic dysfunction (pseudonormalization).  2. Right ventricular systolic function is normal. The right ventricular size is normal. There is mildly elevated pulmonary artery systolic pressure. The estimated right ventricular systolic pressure is 39.7 mmHg.  3. Left atrial size was moderately dilated.  4. The mitral valve is degenerative. Mild to moderate mitral valve regurgitation. No evidence of mitral stenosis.  5. Tricuspid valve regurgitation is moderate.  6. The aortic valve is tricuspid. There is moderate calcification of  the aortic valve. Aortic valve regurgitation is trivial. No aortic stenosis is present.  7. The inferior vena cava is dilated in size with >50% respiratory variability, suggesting right atrial pressure of 8 mmHg. FINDINGS  Left  Ventricle: Left ventricular ejection fraction, by estimation, is 55 to 60%. The left ventricle has normal function. The left ventricle has no regional wall motion abnormalities. The left ventricular internal cavity size was normal in size. There is  no left ventricular hypertrophy. Left ventricular diastolic parameters are consistent with Grade II diastolic dysfunction (pseudonormalization). Right Ventricle: The right ventricular size is normal. No increase in right ventricular wall thickness. Right ventricular systolic function is normal. There is mildly elevated pulmonary artery systolic pressure. The tricuspid regurgitant velocity is 3.03  m/s, and with an assumed right atrial pressure of 3 mmHg, the estimated right ventricular systolic pressure is 39.7 mmHg. Left Atrium: Left atrial size was moderately dilated. Right Atrium: Right atrial size was normal in size. Pericardium: There is no evidence of pericardial effusion. Mitral Valve: The mitral valve is degenerative in appearance. Mild to moderate mitral valve regurgitation, with posteriorly-directed jet. No evidence of mitral valve stenosis. Tricuspid Valve: The tricuspid valve is normal in structure. Tricuspid valve regurgitation is moderate . No evidence of tricuspid stenosis. Aortic Valve: The aortic valve is tricuspid. There is moderate calcification of the aortic valve. Aortic valve regurgitation is trivial. Aortic regurgitation PHT measures 763 msec. No aortic stenosis is present. Pulmonic Valve: The pulmonic valve was not well visualized. Pulmonic valve regurgitation is trivial. No evidence of pulmonic stenosis. Aorta: The aortic root is normal in size and structure. Venous: The inferior vena cava is dilated in size with greater than 50% respiratory variability, suggesting right atrial pressure of 8 mmHg. IAS/Shunts: No atrial level shunt detected by color flow Doppler.  LEFT VENTRICLE PLAX 2D LVIDd:         4.20 cm      Diastology LVIDs:         2.90 cm       LV e' medial:    4.05 cm/s LV PW:         1.30 cm      LV E/e' medial:  18.1 LV IVS:        1.40 cm      LV e' lateral:   5.43 cm/s LVOT diam:     1.90 cm      LV E/e' lateral: 13.5 LV SV:         64 LV SV Index:   42 LVOT Area:     2.84 cm  LV Volumes (MOD) LV vol d, MOD A2C: 128.0 ml LV vol d, MOD A4C: 110.0 ml LV vol s, MOD A2C: 51.8 ml LV vol s, MOD A4C: 38.1 ml LV SV MOD A2C:     76.2 ml LV SV MOD A4C:     110.0 ml LV SV MOD BP:      76.8 ml RIGHT VENTRICLE             IVC RV Basal diam:  3.90 cm     IVC diam: 2.10 cm RV S prime:     11.10 cm/s TAPSE (M-mode): 1.7 cm LEFT ATRIUM             Index        RIGHT ATRIUM           Index LA diam:        3.80 cm 2.53 cm/m   RA  Area:     12.70 cm LA Vol (A2C):   62.6 ml 41.62 ml/m  RA Volume:   26.30 ml  17.49 ml/m LA Vol (A4C):   81.0 ml 53.85 ml/m LA Biplane Vol: 75.6 ml 50.26 ml/m  AORTIC VALVE LVOT Vmax:   96.00 cm/s LVOT Vmean:  60.100 cm/s LVOT VTI:    0.225 m AI PHT:      763 msec  AORTA Ao Root diam: 3.00 cm Ao Asc diam:  2.70 cm MITRAL VALVE                  TRICUSPID VALVE MV Area (PHT): 3.03 cm       TR Peak grad:   36.7 mmHg MV Decel Time: 250 msec       TR Vmax:        303.00 cm/s MR Peak grad:    159.3 mmHg MR Mean grad:    99.0 mmHg    SHUNTS MR Vmax:         631.00 cm/s  Systemic VTI:  0.22 m MR Vmean:        469.0 cm/s   Systemic Diam: 1.90 cm MR PISA:         1.57 cm MR PISA Eff ROA: 8 mm MR PISA Radius:  0.50 cm MV E velocity: 73.20 cm/s MV A velocity: 51.90 cm/s MV E/A ratio:  1.41 Arvilla Meres MD Electronically signed by Arvilla Meres MD Signature Date/Time: 03/01/2023/9:39:08 AM    Final    DG Shoulder Left  Result Date: 03/01/2023 CLINICAL DATA:  Left shoulder pain.  No known injury EXAM: LEFT SHOULDER - 2 VIEW COMPARISON:  None Available. FINDINGS: There is no evidence of fracture or dislocation. Mild superior subluxation of the humeral head relative to the glenoid. Degenerative changes of the acromioclavicular joint. Soft  tissues are unremarkable. IMPRESSION: 1. Mild superior subluxation of the humeral head relative to the glenoid, which can be seen with rotator cuff pathology. Degenerative changes of the acromioclavicular joint. 2.  No acute fracture or dislocation. Electronically Signed   By: Agustin Cree M.D.   On: 03/01/2023 08:17   DG Chest 2 View  Result Date: 02/28/2023 CLINICAL DATA:  Left shoulder pain. EXAM: CHEST - 2 VIEW COMPARISON:  Radiograph 08/24/2022 FINDINGS: Stable heart size allowing for AP technique. Unchanged mediastinal contours. Linear subsegmental opacity in the left upper and lower lobes typical of atelectasis. No pneumothorax or pleural effusion. Normal pulmonary vasculature. Thoracic spondylosis with anterior spurring. There is mild acromioclavicular degenerative change of both shoulders. IMPRESSION: Linear subsegmental atelectasis in the left upper and lower lobes. Electronically Signed   By: Narda Rutherford M.D.   On: 02/28/2023 15:10    Assessment & Plan:   Problem List Items Addressed This Visit     Ankle pain, left - Primary    X ray       Relevant Orders   DG Ankle Complete Left      No orders of the defined types were placed in this encounter.     Follow-up: No follow-ups on file.  Sonda Primes, MD

## 2023-06-16 NOTE — Assessment & Plan Note (Signed)
Falls prevention discussed.  Use a walker

## 2023-06-16 NOTE — Assessment & Plan Note (Signed)
She is legally blind.  Discussed fall prevention

## 2023-06-16 NOTE — Assessment & Plan Note (Signed)
Probable ankle sprain on the left.  Ace wrap applied; obtain x-ray to rule out fracture

## 2023-06-24 DIAGNOSIS — H401133 Primary open-angle glaucoma, bilateral, severe stage: Secondary | ICD-10-CM | POA: Diagnosis not present

## 2023-07-11 ENCOUNTER — Ambulatory Visit: Payer: Medicare PPO | Admitting: Internal Medicine

## 2023-07-11 ENCOUNTER — Encounter: Payer: Self-pay | Admitting: Internal Medicine

## 2023-07-11 VITALS — BP 110/78 | HR 75 | Temp 98.2°F | Ht 62.0 in | Wt 121.0 lb

## 2023-07-11 DIAGNOSIS — M5441 Lumbago with sciatica, right side: Secondary | ICD-10-CM

## 2023-07-11 DIAGNOSIS — G8929 Other chronic pain: Secondary | ICD-10-CM

## 2023-07-11 DIAGNOSIS — S80812A Abrasion, left lower leg, initial encounter: Secondary | ICD-10-CM

## 2023-07-11 DIAGNOSIS — I10 Essential (primary) hypertension: Secondary | ICD-10-CM | POA: Diagnosis not present

## 2023-07-11 DIAGNOSIS — E1121 Type 2 diabetes mellitus with diabetic nephropathy: Secondary | ICD-10-CM | POA: Diagnosis not present

## 2023-07-11 DIAGNOSIS — R269 Unspecified abnormalities of gait and mobility: Secondary | ICD-10-CM | POA: Diagnosis not present

## 2023-07-11 DIAGNOSIS — H547 Unspecified visual loss: Secondary | ICD-10-CM | POA: Diagnosis not present

## 2023-07-11 LAB — CBC WITH DIFFERENTIAL/PLATELET
Basophils Absolute: 0 10*3/uL (ref 0.0–0.1)
Basophils Relative: 0.4 % (ref 0.0–3.0)
Eosinophils Absolute: 0.1 10*3/uL (ref 0.0–0.7)
Eosinophils Relative: 3.5 % (ref 0.0–5.0)
HCT: 35.6 % — ABNORMAL LOW (ref 36.0–46.0)
Hemoglobin: 11.1 g/dL — ABNORMAL LOW (ref 12.0–15.0)
Lymphocytes Relative: 25.3 % (ref 12.0–46.0)
Lymphs Abs: 1 10*3/uL (ref 0.7–4.0)
MCHC: 31.1 g/dL (ref 30.0–36.0)
MCV: 88.6 fl (ref 78.0–100.0)
Monocytes Absolute: 0.4 10*3/uL (ref 0.1–1.0)
Monocytes Relative: 10 % (ref 3.0–12.0)
Neutro Abs: 2.4 10*3/uL (ref 1.4–7.7)
Neutrophils Relative %: 60.8 % (ref 43.0–77.0)
Platelets: 128 10*3/uL — ABNORMAL LOW (ref 150.0–400.0)
RBC: 4.02 Mil/uL (ref 3.87–5.11)
RDW: 16 % — ABNORMAL HIGH (ref 11.5–15.5)
WBC: 3.9 10*3/uL — ABNORMAL LOW (ref 4.0–10.5)

## 2023-07-11 LAB — COMPREHENSIVE METABOLIC PANEL
ALT: 8 U/L (ref 0–35)
AST: 18 U/L (ref 0–37)
Albumin: 3.8 g/dL (ref 3.5–5.2)
Alkaline Phosphatase: 92 U/L (ref 39–117)
BUN: 27 mg/dL — ABNORMAL HIGH (ref 6–23)
CO2: 29 mEq/L (ref 19–32)
Calcium: 9.9 mg/dL (ref 8.4–10.5)
Chloride: 104 mEq/L (ref 96–112)
Creatinine, Ser: 1.26 mg/dL — ABNORMAL HIGH (ref 0.40–1.20)
GFR: 37.75 mL/min — ABNORMAL LOW (ref >60.00)
Glucose, Bld: 100 mg/dL — ABNORMAL HIGH (ref 70–99)
Potassium: 4.7 mEq/L (ref 3.5–5.1)
Sodium: 140 mEq/L (ref 135–145)
Total Bilirubin: 0.4 mg/dL (ref 0.2–1.2)
Total Protein: 7.4 g/dL (ref 6.0–8.3)

## 2023-07-11 LAB — TSH: TSH: 0.52 u[IU]/mL (ref 0.35–5.50)

## 2023-07-11 NOTE — Assessment & Plan Note (Signed)
Healing  Use cocoa butter or Aquaphore

## 2023-07-11 NOTE — Progress Notes (Signed)
Subjective:  Patient ID: Breanna [name], female    DOB: 12/10/1931  Age: 89 y.o. MRN: [id number]  CC: Follow-up (4 WEEK F/U, Still having tenderness and some swelling on lt shin area.)   HPI Breanna [name] presents for pain and discoloration and swelling on the L shin  Outpatient Medications Prior to Visit  Medication Sig Dispense Refill   acetaminophen (TYLENOL) 325 MG tablet Take 2 tablets (650 mg total) by mouth every 6 (six) hours as needed for moderate pain or fever.     Alcohol Swabs (TRUE COMFORT ALCOHOL PREP PADS) 70 % PADS Use as directed 300 each 1   allopurinol (ZYLOPRIM) 100 MG tablet TAKE 1 TABLET BY MOUTH EVERY DAY 90 tablet 1   amLODipine (NORVASC) 5 MG tablet Take 1 tablet (5 mg total) by mouth daily. 90 tablet 3   ascorbic acid (VITAMIN C) 500 MG tablet TAKE 1 TABLET BY MOUTH EVERY DAY 30 tablet 11   ASPIRIN LOW DOSE 81 MG tablet TAKE 1 TABLET BY MOUTH DAILY. SWALLOW WHOLE. 90 tablet 3   atropine 1 % ophthalmic solution Place 1 drop into both eyes daily.     Blood Glucose Calibration (TRUE METRIX LEVEL 1) Low SOLN USE AS DIRECTED 3 each 3   brimonidine (ALPHAGAN) 0.2 % ophthalmic solution Place 1 drop into the right eye 2 (two) times daily.     calcium carbonate (OSCAL) 1500 (600 Ca) MG TABS tablet TAKE 1 TABLET BY MOUTH EVERY DAY 90 tablet 3   Cholecalciferol (VITAMIN D3) 25 MCG (1000 UT) CAPS Take 1 capsule (1,000 Units total) by mouth daily. 90 capsule 3   [employer] VITAMIN B12 1000 MCG tablet TAKE 1 TABLET (1,000 MCG TOTAL) BY MOUTH EVERY DAY 90 tablet 3   diclofenac sodium (VOLTAREN) 1 % GEL Apply 2 g topically 3 (three) times daily. To left shoulder 200 g 3   docusate sodium (COLACE) 100 MG capsule TAKE 1 CAPSULE BY MOUTH TWICE A DAY 180 capsule 3   dorzolamide-timolol (COSOPT) 22.3-6.8 MG/ML ophthalmic solution Place 1 drop into both eyes 2 (two) times daily.      feeding supplement, ENSURE ENLIVE, (ENSURE ENLIVE) LIQD Take 237 mLs by mouth daily at 2 PM. 237 mL 12    gabapentin (NEURONTIN) 100 MG capsule TAKE 1 CAPSULE BY MOUTH THREE TIMES A DAY 90 capsule 5   glucose blood (GNP TRUE METRIX GLUCOSE STRIPS) test strip Use to check blood sugars twice a day 200 each 2   latanoprost (XALATAN) 0.005 % ophthalmic solution Place 1 drop into both eyes at bedtime.      lidocaine (LIDODERM) 5 % Place 1 patch onto the skin daily. Remove & Discard patch within 12 hours or as directed by MD. Apply to left shoulder 30 patch 0   lidocaine-prilocaine (EMLA) cream Apply 1 application topically as needed. 30 g 0   losartan (COZAAR) 100 MG tablet Take 1 tablet (100 mg total) by mouth daily. 90 tablet 3   metFORMIN (GLUCOPHAGE) 500 MG tablet Take 1 tablet (500 mg total) by mouth daily with breakfast. 90 tablet 1   mupirocin ointment (BACTROBAN) 2 % On leg wound w/dressing change qd or bid 30 g 0   Netarsudil-Latanoprost 0.02-0.005 % SOLN Apply to eye.     NON FORMULARY Place 10 drops under the tongue daily as needed (Pain). Medication: CBD oil     pantoprazole (PROTONIX) 40 MG tablet TAKE 1 TABLET BY MOUTH EVERY DAY 90 tablet 3   polyethylene  glycol (MIRALAX / GLYCOLAX) 17 g packet Take 17 g by mouth daily as needed for mild constipation. 14 each 0   senna (SENOKOT) 8.6 MG TABS tablet Take 1 tablet (8.6 mg total) by mouth 2 (two) times daily. 120 tablet 0   sertraline (ZOLOFT) 100 MG tablet TAKE 1 TABLET BY MOUTH EVERY DAY 90 tablet 1   spironolactone (ALDACTONE) 25 MG tablet TAKE 1 TAB BY MOUTH MON - WED - FRI 90 tablet 1   traMADol (ULTRAM) 50 MG tablet Take 1 tablet (50 mg total) by mouth every 8 (eight) hours as needed for severe pain. 30 tablet 0   TRUEplus Lancets 33G MISC USE AS DIRECTED TO CHECK BLOOD SUGAR TWICE A DAY 200 each 1   valACYclovir (VALTREX) 500 MG tablet Take 1 tablet (500 mg total) by mouth daily as needed (for outbreaks).     Facility-Administered Medications Prior to Visit  Medication Dose Route Frequency Provider Last Rate Last Admin   regadenoson  (LEXISCAN) injection SOLN 0.4 mg  0.4 mg Intravenous Once Meriam Sprague, MD       technetium tetrofosmin (TC-MYOVIEW) injection 31.1 millicurie  31.1 millicurie Intravenous Once PRN Meriam Sprague, MD        ROS: Review of Systems  Objective:  BP 110/78 (BP Location: Right Arm, Patient Position: Sitting, Cuff Size: Large)   Pulse 75   Temp 98.2 F (36.8 C) (Oral)   Ht 5\' 2"  (1.575 m)   Wt 121 lb (54.9 kg)   SpO2 95%   BMI 22.13 kg/m   BP Readings from Last 3 Encounters:  07/11/23 110/78  06/14/23 (!) 168/84  05/28/23 (!) 168/80    Wt Readings from Last 3 Encounters:  07/11/23 121 lb (54.9 kg)  06/14/23 115 lb (52.2 kg)  05/28/23 114 lb (51.7 kg)    Physical Exam  Lab Results  Component Value Date   WBC 4.1 03/02/2023   HGB 9.9 (L) 03/02/2023   HCT 31.0 (L) 03/02/2023   PLT 121 (L) 03/02/2023   GLUCOSE 93 04/11/2023   CHOL 127 03/01/2023   TRIG 42 03/01/2023   HDL 53 03/01/2023   LDLCALC 66 03/01/2023   ALT 9 04/11/2023   AST 20 04/11/2023   NA 141 04/11/2023   K 5.1 04/11/2023   CL 105 04/11/2023   CREATININE 1.19 04/11/2023   BUN 24 (H) 04/11/2023   CO2 29 04/11/2023   TSH 0.64 08/24/2022   HGBA1C 6.2 02/13/2023    VAS [place] LOWER EXTREMITY VENOUS (DVT)  Result Date: 03/03/2023  Lower Venous DVT Study Patient Name:  Breanna [name]  Date of Exam:   03/01/2023 Medical Rec #: [id number]        Accession #:    [id number] Date of Birth: [birth year]         Patient Gender: F Patient Age:   32 years Exam Location:  [facility] Procedure:      VAS [place] LOWER EXTREMITY VENOUS (DVT) Referring Phys: RIPUDEEP RAI --------------------------------------------------------------------------------  Indications: Edema.  Risk Factors: None identified. Limitations: Poor ultrasound/tissue interface. Comparison Study: No prior studies. Performing Technologist: Chanda Busing RVT  Examination Guidelines: A complete evaluation includes B-mode imaging, spectral Doppler,  color Doppler, and power Doppler as needed of all accessible portions of each vessel. Bilateral testing is considered an integral part of a complete examination. Limited examinations for reoccurring indications may be performed as noted. The reflux portion of the exam is performed with the patient in reverse Trendelenburg.  +---------+---------------+---------+-----------+----------+--------------+  RIGHT    CompressibilityPhasicitySpontaneityPropertiesThrombus Aging +---------+---------------+---------+-----------+----------+--------------+ CFV      Full           Yes      Yes                                 +---------+---------------+---------+-----------+----------+--------------+ SFJ      Full                                                        +---------+---------------+---------+-----------+----------+--------------+ FV Prox  Full                                                        +---------+---------------+---------+-----------+----------+--------------+ FV Mid   Full                                                        +---------+---------------+---------+-----------+----------+--------------+ FV DistalFull                                                        +---------+---------------+---------+-----------+----------+--------------+ PFV      Full                                                        +---------+---------------+---------+-----------+----------+--------------+ POP      Full           Yes      Yes                                 +---------+---------------+---------+-----------+----------+--------------+ PTV      Full                                                        +---------+---------------+---------+-----------+----------+--------------+ PERO     Full                                                        +---------+---------------+---------+-----------+----------+--------------+    +---------+---------------+---------+-----------+----------+--------------+ LEFT     CompressibilityPhasicitySpontaneityPropertiesThrombus Aging +---------+---------------+---------+-----------+----------+--------------+ CFV      Full           Yes      Yes                                 +---------+---------------+---------+-----------+----------+--------------+  SFJ      Full                                                        +---------+---------------+---------+-----------+----------+--------------+ FV Prox  Full                                                        +---------+---------------+---------+-----------+----------+--------------+ FV Mid   Full                                                        +---------+---------------+---------+-----------+----------+--------------+ FV DistalFull                                                        +---------+---------------+---------+-----------+----------+--------------+ PFV      Full                                                        +---------+---------------+---------+-----------+----------+--------------+ POP      Full           Yes      Yes                                 +---------+---------------+---------+-----------+----------+--------------+ PTV      Full                                                        +---------+---------------+---------+-----------+----------+--------------+ PERO     Full                                                        +---------+---------------+---------+-----------+----------+--------------+     Summary: RIGHT: - There is no evidence of deep vein thrombosis in the lower extremity.  - No cystic structure found in the popliteal fossa.  LEFT: - There is no evidence of deep vein thrombosis in the lower extremity.  - No cystic structure found in the popliteal fossa.  *See table(s) above for measurements and observations. Electronically signed  by Heath Lark on 03/03/2023 at 4:16:14 PM.    Final    CT Angio Chest Pulmonary Embolism (PE) W or WO Contrast  Result Date: 03/01/2023 CLINICAL DATA:  Lungs are intermediate probability for PE. Positive D-dimer. EXAM: CT ANGIOGRAPHY CHEST WITH CONTRAST TECHNIQUE: Multidetector  CT imaging of the chest was performed using the standard protocol during bolus administration of intravenous contrast. Multiplanar CT image reconstructions and MIPs were obtained to evaluate the vascular anatomy. RADIATION DOSE REDUCTION: This exam was performed according to the departmental dose-optimization program which includes automated exposure control, adjustment of the mA and/or kV according to patient size and/or use of iterative reconstruction technique. CONTRAST:  75mL OMNIPAQUE IOHEXOL 350 MG/ML SOLN COMPARISON:  Chest CT 08/15/2017 FINDINGS: Cardiovascular: Heart is enlarged. Aortic is ectatic without focal dilatation. There is adequate opacification of the pulmonary arteries to the segmental level. There is no evidence for pulmonary embolism. There are atherosclerotic calcifications of the aorta. Mediastinum/Nodes: No enlarged mediastinal, hilar, or axillary lymph nodes. Esophagus and trachea are within normal limits. The thyroid gland is diffusely heterogeneous. There are calcified mediastinal and right hilar lymph nodes. Lungs/Pleura: Lungs are clear. No pleural effusion or pneumothorax. Upper Abdomen: No acute abnormality. There are calcified granulomas in the liver and spleen. Rounded mildly hyperdense area in the superior pole of the left kidney is new from prior measuring 9 mm. Musculoskeletal: The bones are osteopenic. Degenerative changes affect the spine. Review of the MIP images confirms the above findings. IMPRESSION: 1. No evidence for pulmonary embolism or other acute cardiopulmonary process. 2. Cardiomegaly. 3. New 9 mm hyperdense lesion in the superior pole of the left kidney. This may represent a hyperdense  cyst, but is indeterminate. This can be further evaluated with ultrasound. Aortic Atherosclerosis (ICD10-I70.0). Electronically Signed   By: Darliss Cheney M.D.   On: 03/01/2023 21:40   ECHOCARDIOGRAM COMPLETE  Result Date: 03/01/2023    ECHOCARDIOGRAM REPORT   Patient Name:   GABBY RACKERS Date of Exam: 03/01/2023 Medical Rec #:  [id number]       Height:       62.0 in Accession #:    [id number]      Weight:       113.8 lb Date of Birth:  [birth year]        BSA:          1.504 m Patient Age:    89 years        BP:           147/70 mmHg Patient Gender: F               HR:           51 bpm. Exam Location:  Inpatient Procedure: 2D Echo, Cardiac Doppler and Color Doppler Indications:    elevated troponin  History:        Patient has no prior history of Echocardiogram examinations.                 Stroke; Risk Factors:Hypertension and Dyslipidemia.  Sonographer:    Mike Gip Referring Phys: [id number] JUSTIN B HOWERTER IMPRESSIONS  1. Left ventricular ejection fraction, by estimation, is 55 to 60%. The left ventricle has normal function. The left ventricle has no regional wall motion abnormalities. Left ventricular diastolic parameters are consistent with Grade II diastolic dysfunction (pseudonormalization).  2. Right ventricular systolic function is normal. The right ventricular size is normal. There is mildly elevated pulmonary artery systolic pressure. The estimated right ventricular systolic pressure is 39.7 mmHg.  3. Left atrial size was moderately dilated.  4. The mitral valve is degenerative. Mild to moderate mitral valve regurgitation. No evidence of mitral stenosis.  5. Tricuspid valve regurgitation is moderate.  6. The aortic valve is tricuspid. There is moderate calcification of  the aortic valve. Aortic valve regurgitation is trivial. No aortic stenosis is present.  7. The inferior vena cava is dilated in size with >50% respiratory variability, suggesting right atrial pressure of 8 mmHg. FINDINGS  Left  Ventricle: Left ventricular ejection fraction, by estimation, is 55 to 60%. The left ventricle has normal function. The left ventricle has no regional wall motion abnormalities. The left ventricular internal cavity size was normal in size. There is  no left ventricular hypertrophy. Left ventricular diastolic parameters are consistent with Grade II diastolic dysfunction (pseudonormalization). Right Ventricle: The right ventricular size is normal. No increase in right ventricular wall thickness. Right ventricular systolic function is normal. There is mildly elevated pulmonary artery systolic pressure. The tricuspid regurgitant velocity is 3.03  m/s, and with an assumed right atrial pressure of 3 mmHg, the estimated right ventricular systolic pressure is 39.7 mmHg. Left Atrium: Left atrial size was moderately dilated. Right Atrium: Right atrial size was normal in size. Pericardium: There is no evidence of pericardial effusion. Mitral Valve: The mitral valve is degenerative in appearance. Mild to moderate mitral valve regurgitation, with posteriorly-directed jet. No evidence of mitral valve stenosis. Tricuspid Valve: The tricuspid valve is normal in structure. Tricuspid valve regurgitation is moderate . No evidence of tricuspid stenosis. Aortic Valve: The aortic valve is tricuspid. There is moderate calcification of the aortic valve. Aortic valve regurgitation is trivial. Aortic regurgitation PHT measures 763 msec. No aortic stenosis is present. Pulmonic Valve: The pulmonic valve was not well visualized. Pulmonic valve regurgitation is trivial. No evidence of pulmonic stenosis. Aorta: The aortic root is normal in size and structure. Venous: The inferior vena cava is dilated in size with greater than 50% respiratory variability, suggesting right atrial pressure of 8 mmHg. IAS/Shunts: No atrial level shunt detected by color flow Doppler.  LEFT VENTRICLE PLAX 2D LVIDd:         4.20 cm      Diastology LVIDs:         2.90 cm       LV e' medial:    4.05 cm/s LV PW:         1.30 cm      LV E/e' medial:  18.1 LV IVS:        1.40 cm      LV e' lateral:   5.43 cm/s LVOT diam:     1.90 cm      LV E/e' lateral: 13.5 LV SV:         64 LV SV Index:   42 LVOT Area:     2.84 cm  LV Volumes (MOD) LV vol d, MOD A2C: 128.0 ml LV vol d, MOD A4C: 110.0 ml LV vol s, MOD A2C: 51.8 ml LV vol s, MOD A4C: 38.1 ml LV SV MOD A2C:     76.2 ml LV SV MOD A4C:     110.0 ml LV SV MOD BP:      76.8 ml RIGHT VENTRICLE             IVC RV Basal diam:  3.90 cm     IVC diam: 2.10 cm RV S prime:     11.10 cm/s TAPSE (M-mode): 1.7 cm LEFT ATRIUM             Index        RIGHT ATRIUM           Index LA diam:        3.80 cm 2.53 cm/m   RA  Area:     12.70 cm LA Vol (A2C):   62.6 ml 41.62 ml/m  RA Volume:   26.30 ml  17.49 ml/m LA Vol (A4C):   81.0 ml 53.85 ml/m LA Biplane Vol: 75.6 ml 50.26 ml/m  AORTIC VALVE LVOT Vmax:   96.00 cm/s LVOT Vmean:  60.100 cm/s LVOT VTI:    0.225 m AI PHT:      763 msec  AORTA Ao Root diam: 3.00 cm Ao Asc diam:  2.70 cm MITRAL VALVE                  TRICUSPID VALVE MV Area (PHT): 3.03 cm       TR Peak grad:   36.7 mmHg MV Decel Time: 250 msec       TR Vmax:        303.00 cm/s MR Peak grad:    159.3 mmHg MR Mean grad:    99.0 mmHg    SHUNTS MR Vmax:         631.00 cm/s  Systemic VTI:  0.22 m MR Vmean:        469.0 cm/s   Systemic Diam: 1.90 cm MR PISA:         1.57 cm MR PISA Eff ROA: 8 mm MR PISA Radius:  0.50 cm MV E velocity: 73.20 cm/s MV A velocity: 51.90 cm/s MV E/A ratio:  1.41 Arvilla Meres MD Electronically signed by Arvilla Meres MD Signature Date/Time: 03/01/2023/9:39:08 AM    Final    DG Shoulder Left  Result Date: 03/01/2023 CLINICAL DATA:  Left shoulder pain.  No known injury EXAM: LEFT SHOULDER - 2 VIEW COMPARISON:  None Available. FINDINGS: There is no evidence of fracture or dislocation. Mild superior subluxation of the humeral head relative to the glenoid. Degenerative changes of the acromioclavicular joint. Soft  tissues are unremarkable. IMPRESSION: 1. Mild superior subluxation of the humeral head relative to the glenoid, which can be seen with rotator cuff pathology. Degenerative changes of the acromioclavicular joint. 2.  No acute fracture or dislocation. Electronically Signed   By: Agustin Cree M.D.   On: 03/01/2023 08:17   DG Chest 2 View  Result Date: 02/28/2023 CLINICAL DATA:  Left shoulder pain. EXAM: CHEST - 2 VIEW COMPARISON:  Radiograph 08/24/2022 FINDINGS: Stable heart size allowing for AP technique. Unchanged mediastinal contours. Linear subsegmental opacity in the left upper and lower lobes typical of atelectasis. No pneumothorax or pleural effusion. Normal pulmonary vasculature. Thoracic spondylosis with anterior spurring. There is mild acromioclavicular degenerative change of both shoulders. IMPRESSION: Linear subsegmental atelectasis in the left upper and lower lobes. Electronically Signed   By: Narda Rutherford M.D.   On: 02/28/2023 15:10    Assessment & Plan:   Problem List Items Addressed This Visit     DM2 (diabetes mellitus, type 2) (HCC) - Primary    Chronic On Metformin      Essential hypertension    Continue w/Coreg, Amlodipine, Losartan, Spironolactone BP is nl      LOW BACK PAIN   Vision loss   Relevant Orders   Ambulatory referral to Home Health   Gait disorder    Start home PT      Relevant Orders   Ambulatory referral to Home Health   Leg abrasion    Healing  Use cocoa butter or Aquaphore         No orders of the defined types were placed in this encounter.     Follow-up: Return in about  2 months (around 09/11/2023).  Sonda Primes, MD

## 2023-07-11 NOTE — Assessment & Plan Note (Signed)
Start home PT

## 2023-07-11 NOTE — Assessment & Plan Note (Signed)
Continue w/Coreg, Amlodipine, Losartan, Spironolactone BP is nl

## 2023-07-11 NOTE — Assessment & Plan Note (Signed)
Chronic On Metformin 

## 2023-07-12 ENCOUNTER — Telehealth: Payer: Self-pay | Admitting: Internal Medicine

## 2023-07-12 DIAGNOSIS — Z79891 Long term (current) use of opiate analgesic: Secondary | ICD-10-CM | POA: Diagnosis not present

## 2023-07-12 DIAGNOSIS — M5441 Lumbago with sciatica, right side: Secondary | ICD-10-CM | POA: Diagnosis not present

## 2023-07-12 DIAGNOSIS — H547 Unspecified visual loss: Secondary | ICD-10-CM | POA: Diagnosis not present

## 2023-07-12 DIAGNOSIS — G8929 Other chronic pain: Secondary | ICD-10-CM | POA: Diagnosis not present

## 2023-07-12 DIAGNOSIS — Z7984 Long term (current) use of oral hypoglycemic drugs: Secondary | ICD-10-CM | POA: Diagnosis not present

## 2023-07-12 DIAGNOSIS — E1121 Type 2 diabetes mellitus with diabetic nephropathy: Secondary | ICD-10-CM | POA: Diagnosis not present

## 2023-07-12 DIAGNOSIS — I10 Essential (primary) hypertension: Secondary | ICD-10-CM | POA: Diagnosis not present

## 2023-07-12 DIAGNOSIS — S80812D Abrasion, left lower leg, subsequent encounter: Secondary | ICD-10-CM | POA: Diagnosis not present

## 2023-07-12 DIAGNOSIS — R269 Unspecified abnormalities of gait and mobility: Secondary | ICD-10-CM | POA: Diagnosis not present

## 2023-07-12 NOTE — Telephone Encounter (Signed)
Called [name] back ok verbal pt is in good compliance w/appts.Breanna KitchenRaechel Chute

## 2023-07-12 NOTE — Telephone Encounter (Signed)
Breanna [name] from [facility] called for verbal orders to improve strength and balance with a frequency of:  1X for 2 weeks 2X for 3 weeks 1X for 3 weeks  Breanna [name] would also like to do an evaluation for OT as well.   Please call Breanna [name] to confirm:   [phone]

## 2023-07-16 DIAGNOSIS — H547 Unspecified visual loss: Secondary | ICD-10-CM | POA: Diagnosis not present

## 2023-07-16 DIAGNOSIS — Z79891 Long term (current) use of opiate analgesic: Secondary | ICD-10-CM | POA: Diagnosis not present

## 2023-07-16 DIAGNOSIS — M5441 Lumbago with sciatica, right side: Secondary | ICD-10-CM | POA: Diagnosis not present

## 2023-07-16 DIAGNOSIS — E1121 Type 2 diabetes mellitus with diabetic nephropathy: Secondary | ICD-10-CM | POA: Diagnosis not present

## 2023-07-16 DIAGNOSIS — R269 Unspecified abnormalities of gait and mobility: Secondary | ICD-10-CM | POA: Diagnosis not present

## 2023-07-16 DIAGNOSIS — I10 Essential (primary) hypertension: Secondary | ICD-10-CM | POA: Diagnosis not present

## 2023-07-16 DIAGNOSIS — G8929 Other chronic pain: Secondary | ICD-10-CM | POA: Diagnosis not present

## 2023-07-16 DIAGNOSIS — Z7984 Long term (current) use of oral hypoglycemic drugs: Secondary | ICD-10-CM | POA: Diagnosis not present

## 2023-07-16 DIAGNOSIS — S80812D Abrasion, left lower leg, subsequent encounter: Secondary | ICD-10-CM | POA: Diagnosis not present

## 2023-07-17 DIAGNOSIS — E1121 Type 2 diabetes mellitus with diabetic nephropathy: Secondary | ICD-10-CM | POA: Diagnosis not present

## 2023-07-17 DIAGNOSIS — I10 Essential (primary) hypertension: Secondary | ICD-10-CM | POA: Diagnosis not present

## 2023-07-17 DIAGNOSIS — Z7984 Long term (current) use of oral hypoglycemic drugs: Secondary | ICD-10-CM | POA: Diagnosis not present

## 2023-07-17 DIAGNOSIS — Z79891 Long term (current) use of opiate analgesic: Secondary | ICD-10-CM | POA: Diagnosis not present

## 2023-07-17 DIAGNOSIS — G8929 Other chronic pain: Secondary | ICD-10-CM | POA: Diagnosis not present

## 2023-07-17 DIAGNOSIS — M5441 Lumbago with sciatica, right side: Secondary | ICD-10-CM | POA: Diagnosis not present

## 2023-07-17 DIAGNOSIS — S80812D Abrasion, left lower leg, subsequent encounter: Secondary | ICD-10-CM | POA: Diagnosis not present

## 2023-07-17 DIAGNOSIS — H547 Unspecified visual loss: Secondary | ICD-10-CM | POA: Diagnosis not present

## 2023-07-17 DIAGNOSIS — R269 Unspecified abnormalities of gait and mobility: Secondary | ICD-10-CM | POA: Diagnosis not present

## 2023-07-18 ENCOUNTER — Telehealth: Payer: Self-pay | Admitting: Internal Medicine

## 2023-07-18 NOTE — Telephone Encounter (Signed)
Called Breanna [name] inform her ok for verbal. Pt is good compliance w/appt.Marland KitchenRaechel Chute

## 2023-07-18 NOTE — Telephone Encounter (Signed)
[employer]:  Delorise Shiner with [facility]   Phone Number:  [phone]   Needs Verbal orders for what service & frequency:  Occupational therapy once a week for 8 weeks

## 2023-07-23 ENCOUNTER — Ambulatory Visit (INDEPENDENT_AMBULATORY_CARE_PROVIDER_SITE_OTHER): Payer: Medicare PPO | Admitting: Podiatry

## 2023-07-23 DIAGNOSIS — B351 Tinea unguium: Secondary | ICD-10-CM | POA: Diagnosis not present

## 2023-07-23 DIAGNOSIS — E1151 Type 2 diabetes mellitus with diabetic peripheral angiopathy without gangrene: Secondary | ICD-10-CM

## 2023-07-23 DIAGNOSIS — L84 Corns and callosities: Secondary | ICD-10-CM

## 2023-07-23 NOTE — Progress Notes (Signed)
    Subjective:  Patient ID: Breanna [name], female    DOB: 1931/12/01,  MRN: [id number]  Patient notes nails are thick and elongated, causing pain in shoe gear when ambulating.  She gets a painful callus on the posterolateral aspect of the right heel.  States that once it starts to build up it can be very uncomfortable for her.  Patient is legally blind and is using a walker today.  Her son helps guide her and assist her with ambulation  PCP is Plotnikov, Georgina Quint, MD. date last seen was 06/14/2023.  Last HbA1c level on 02/13/2023 was 6.2  Allergies  Allergen Reactions   Verapamil Shortness Of Breath    REACTION: SOB   Aspirin Itching    But tolerates low dose   Atenolol     REACTION: fatigue   Codeine Itching   Codeine Sulfate    Hydrochlorothiazide     REACTION: gout   Hydrocodone       side effects - hallucinations   Ibuprofen     Upset stomach w/high doses   Lisinopril     REACTION: cough   Onion Other (See Comments)    Dry mouth/ gets sores   Shellfish Allergy Swelling    Patient stated she does not eat shellfish, she swells up on different parts of the body   Statins     Dizzy, falls   Valsartan Itching   Adhesive  [Tape] Rash   Other Rash    Review of Systems: Negative except as noted in the HPI.  Objective:  There were no vitals filed for this visit.  Breanna [name] is a pleasant 89 y.o. female in NAD. AAO x 3.  Vascular Examination: Patient has palpable DP pulse, absent PT pulse bilateral.  Delayed capillary refill bilateral toes.  Sparse digital hair bilateral.  Proximal to distal cooling WNL bilateral.    Dermatological Examination: Interspaces are clear with no open lesions noted bilateral.  Nails are 3-19mm thick, with yellowish/brown discoloration, subungual debris and distal onycholysis x10.  There is pain with compression of nails x10.  There are hyperkeratotic lesions noted on the posterior lateral aspect of the right heel.     Latest Ref Rng &  Units 02/13/2023    3:53 PM 11/13/2022    4:07 PM 08/24/2022    1:24 PM  Hemoglobin A1C  Hemoglobin-A1c 4.6 - 6.5 % 6.2  6.4  6.3    Patient qualifies for at-risk foot care because of diabetes with PVD.  Assessment/Plan: 1. Dermatophytosis of nail   2. Type II diabetes mellitus with peripheral circulatory disorder (HCC)   3. Callus of foot    Mycotic nails x10 were sharply debrided with sterile nail nippers and power debriding burr to decrease bulk and length.  Hyperkeratotic lesion on the heel was shaved with #312 blade.  Return in about 3 months (around 10/23/2023) for RFC.  Clerance Lav, DPM, FACFAS [facility]     [address] [place], [place] [address]                Office [phone]  Fax [phone]

## 2023-07-24 ENCOUNTER — Encounter (INDEPENDENT_AMBULATORY_CARE_PROVIDER_SITE_OTHER): Payer: Self-pay

## 2023-07-24 ENCOUNTER — Telehealth: Payer: Self-pay | Admitting: Internal Medicine

## 2023-07-24 DIAGNOSIS — E1121 Type 2 diabetes mellitus with diabetic nephropathy: Secondary | ICD-10-CM | POA: Diagnosis not present

## 2023-07-24 DIAGNOSIS — I10 Essential (primary) hypertension: Secondary | ICD-10-CM | POA: Diagnosis not present

## 2023-07-24 DIAGNOSIS — S80812D Abrasion, left lower leg, subsequent encounter: Secondary | ICD-10-CM | POA: Diagnosis not present

## 2023-07-24 DIAGNOSIS — H547 Unspecified visual loss: Secondary | ICD-10-CM | POA: Diagnosis not present

## 2023-07-24 DIAGNOSIS — G8929 Other chronic pain: Secondary | ICD-10-CM | POA: Diagnosis not present

## 2023-07-24 DIAGNOSIS — R269 Unspecified abnormalities of gait and mobility: Secondary | ICD-10-CM | POA: Diagnosis not present

## 2023-07-24 DIAGNOSIS — Z79891 Long term (current) use of opiate analgesic: Secondary | ICD-10-CM | POA: Diagnosis not present

## 2023-07-24 DIAGNOSIS — M5441 Lumbago with sciatica, right side: Secondary | ICD-10-CM | POA: Diagnosis not present

## 2023-07-24 DIAGNOSIS — Z7984 Long term (current) use of oral hypoglycemic drugs: Secondary | ICD-10-CM | POA: Diagnosis not present

## 2023-07-24 NOTE — Telephone Encounter (Signed)
Edmon Crape from [facility] called to let PCP know that patient's pulse was at 54. They said she had no other symptoms and was in good spirits. Spoke with Selena Batten and was advised to route a message back since patient was having no other symptoms. Best callback for Edmon Crape is [phone]).

## 2023-07-24 NOTE — Telephone Encounter (Signed)
Please call if HR<50 Thanks

## 2023-07-25 NOTE — Telephone Encounter (Signed)
Breanna [name] w/ MD response.Marland KitchenRaechel [name]

## 2023-07-26 ENCOUNTER — Telehealth: Payer: Self-pay | Admitting: Internal Medicine

## 2023-07-26 DIAGNOSIS — M5441 Lumbago with sciatica, right side: Secondary | ICD-10-CM | POA: Diagnosis not present

## 2023-07-26 DIAGNOSIS — S80812D Abrasion, left lower leg, subsequent encounter: Secondary | ICD-10-CM | POA: Diagnosis not present

## 2023-07-26 DIAGNOSIS — Z79891 Long term (current) use of opiate analgesic: Secondary | ICD-10-CM | POA: Diagnosis not present

## 2023-07-26 DIAGNOSIS — H547 Unspecified visual loss: Secondary | ICD-10-CM | POA: Diagnosis not present

## 2023-07-26 DIAGNOSIS — Z7984 Long term (current) use of oral hypoglycemic drugs: Secondary | ICD-10-CM | POA: Diagnosis not present

## 2023-07-26 DIAGNOSIS — R269 Unspecified abnormalities of gait and mobility: Secondary | ICD-10-CM | POA: Diagnosis not present

## 2023-07-26 DIAGNOSIS — E1121 Type 2 diabetes mellitus with diabetic nephropathy: Secondary | ICD-10-CM | POA: Diagnosis not present

## 2023-07-26 DIAGNOSIS — I10 Essential (primary) hypertension: Secondary | ICD-10-CM | POA: Diagnosis not present

## 2023-07-26 DIAGNOSIS — G8929 Other chronic pain: Secondary | ICD-10-CM | POA: Diagnosis not present

## 2023-07-26 NOTE — Telephone Encounter (Signed)
Kristi with [facility] called states patient son requested palliative care for patient. States they will be sending a nurse out to do an assessment. If there are any question the best callback number is [phone].

## 2023-07-26 NOTE — Telephone Encounter (Signed)
Faxed last ov note to [facility].Marland KitchenShearon Stalls

## 2023-07-26 NOTE — Telephone Encounter (Signed)
Breanna [name] with [facility] needs patient's last office notes faxed to [phone]

## 2023-07-30 DIAGNOSIS — G8929 Other chronic pain: Secondary | ICD-10-CM | POA: Diagnosis not present

## 2023-07-30 DIAGNOSIS — H547 Unspecified visual loss: Secondary | ICD-10-CM | POA: Diagnosis not present

## 2023-07-30 DIAGNOSIS — E1121 Type 2 diabetes mellitus with diabetic nephropathy: Secondary | ICD-10-CM | POA: Diagnosis not present

## 2023-07-30 DIAGNOSIS — Z7984 Long term (current) use of oral hypoglycemic drugs: Secondary | ICD-10-CM | POA: Diagnosis not present

## 2023-07-30 DIAGNOSIS — Z79891 Long term (current) use of opiate analgesic: Secondary | ICD-10-CM | POA: Diagnosis not present

## 2023-07-30 DIAGNOSIS — R269 Unspecified abnormalities of gait and mobility: Secondary | ICD-10-CM | POA: Diagnosis not present

## 2023-07-30 DIAGNOSIS — I10 Essential (primary) hypertension: Secondary | ICD-10-CM | POA: Diagnosis not present

## 2023-07-30 DIAGNOSIS — M5441 Lumbago with sciatica, right side: Secondary | ICD-10-CM | POA: Diagnosis not present

## 2023-07-30 DIAGNOSIS — S80812D Abrasion, left lower leg, subsequent encounter: Secondary | ICD-10-CM | POA: Diagnosis not present

## 2023-07-30 NOTE — Telephone Encounter (Signed)
Noted.  No need to send a referral?  Thank you

## 2023-07-31 DIAGNOSIS — H547 Unspecified visual loss: Secondary | ICD-10-CM | POA: Diagnosis not present

## 2023-07-31 DIAGNOSIS — Z7984 Long term (current) use of oral hypoglycemic drugs: Secondary | ICD-10-CM | POA: Diagnosis not present

## 2023-07-31 DIAGNOSIS — M5441 Lumbago with sciatica, right side: Secondary | ICD-10-CM | POA: Diagnosis not present

## 2023-07-31 DIAGNOSIS — G8929 Other chronic pain: Secondary | ICD-10-CM | POA: Diagnosis not present

## 2023-07-31 DIAGNOSIS — S80812D Abrasion, left lower leg, subsequent encounter: Secondary | ICD-10-CM | POA: Diagnosis not present

## 2023-07-31 DIAGNOSIS — R269 Unspecified abnormalities of gait and mobility: Secondary | ICD-10-CM | POA: Diagnosis not present

## 2023-07-31 DIAGNOSIS — E1121 Type 2 diabetes mellitus with diabetic nephropathy: Secondary | ICD-10-CM | POA: Diagnosis not present

## 2023-07-31 DIAGNOSIS — I10 Essential (primary) hypertension: Secondary | ICD-10-CM | POA: Diagnosis not present

## 2023-07-31 DIAGNOSIS — Z79891 Long term (current) use of opiate analgesic: Secondary | ICD-10-CM | POA: Diagnosis not present

## 2023-08-02 ENCOUNTER — Other Ambulatory Visit: Payer: Self-pay | Admitting: Internal Medicine

## 2023-08-02 DIAGNOSIS — M5441 Lumbago with sciatica, right side: Secondary | ICD-10-CM | POA: Diagnosis not present

## 2023-08-02 DIAGNOSIS — H547 Unspecified visual loss: Secondary | ICD-10-CM | POA: Diagnosis not present

## 2023-08-02 DIAGNOSIS — G8929 Other chronic pain: Secondary | ICD-10-CM | POA: Diagnosis not present

## 2023-08-02 DIAGNOSIS — Z7984 Long term (current) use of oral hypoglycemic drugs: Secondary | ICD-10-CM | POA: Diagnosis not present

## 2023-08-02 DIAGNOSIS — Z79891 Long term (current) use of opiate analgesic: Secondary | ICD-10-CM | POA: Diagnosis not present

## 2023-08-02 DIAGNOSIS — I10 Essential (primary) hypertension: Secondary | ICD-10-CM | POA: Diagnosis not present

## 2023-08-02 DIAGNOSIS — E1121 Type 2 diabetes mellitus with diabetic nephropathy: Secondary | ICD-10-CM | POA: Diagnosis not present

## 2023-08-02 DIAGNOSIS — R269 Unspecified abnormalities of gait and mobility: Secondary | ICD-10-CM | POA: Diagnosis not present

## 2023-08-02 DIAGNOSIS — S80812D Abrasion, left lower leg, subsequent encounter: Secondary | ICD-10-CM | POA: Diagnosis not present

## 2023-08-06 DIAGNOSIS — I10 Essential (primary) hypertension: Secondary | ICD-10-CM | POA: Diagnosis not present

## 2023-08-06 DIAGNOSIS — R269 Unspecified abnormalities of gait and mobility: Secondary | ICD-10-CM | POA: Diagnosis not present

## 2023-08-06 DIAGNOSIS — H547 Unspecified visual loss: Secondary | ICD-10-CM | POA: Diagnosis not present

## 2023-08-06 DIAGNOSIS — Z79891 Long term (current) use of opiate analgesic: Secondary | ICD-10-CM | POA: Diagnosis not present

## 2023-08-06 DIAGNOSIS — Z7984 Long term (current) use of oral hypoglycemic drugs: Secondary | ICD-10-CM | POA: Diagnosis not present

## 2023-08-06 DIAGNOSIS — G8929 Other chronic pain: Secondary | ICD-10-CM | POA: Diagnosis not present

## 2023-08-06 DIAGNOSIS — S80812D Abrasion, left lower leg, subsequent encounter: Secondary | ICD-10-CM | POA: Diagnosis not present

## 2023-08-06 DIAGNOSIS — M5441 Lumbago with sciatica, right side: Secondary | ICD-10-CM | POA: Diagnosis not present

## 2023-08-06 DIAGNOSIS — E1121 Type 2 diabetes mellitus with diabetic nephropathy: Secondary | ICD-10-CM | POA: Diagnosis not present

## 2023-08-07 DIAGNOSIS — M5441 Lumbago with sciatica, right side: Secondary | ICD-10-CM | POA: Diagnosis not present

## 2023-08-07 DIAGNOSIS — Z79891 Long term (current) use of opiate analgesic: Secondary | ICD-10-CM | POA: Diagnosis not present

## 2023-08-07 DIAGNOSIS — S80812D Abrasion, left lower leg, subsequent encounter: Secondary | ICD-10-CM | POA: Diagnosis not present

## 2023-08-07 DIAGNOSIS — Z7984 Long term (current) use of oral hypoglycemic drugs: Secondary | ICD-10-CM | POA: Diagnosis not present

## 2023-08-07 DIAGNOSIS — H547 Unspecified visual loss: Secondary | ICD-10-CM | POA: Diagnosis not present

## 2023-08-07 DIAGNOSIS — R269 Unspecified abnormalities of gait and mobility: Secondary | ICD-10-CM | POA: Diagnosis not present

## 2023-08-07 DIAGNOSIS — E1121 Type 2 diabetes mellitus with diabetic nephropathy: Secondary | ICD-10-CM | POA: Diagnosis not present

## 2023-08-07 DIAGNOSIS — G8929 Other chronic pain: Secondary | ICD-10-CM | POA: Diagnosis not present

## 2023-08-07 DIAGNOSIS — I10 Essential (primary) hypertension: Secondary | ICD-10-CM | POA: Diagnosis not present

## 2023-08-08 ENCOUNTER — Encounter (INDEPENDENT_AMBULATORY_CARE_PROVIDER_SITE_OTHER): Payer: Self-pay

## 2023-08-09 ENCOUNTER — Telehealth: Payer: Self-pay | Admitting: Internal Medicine

## 2023-08-09 DIAGNOSIS — E1121 Type 2 diabetes mellitus with diabetic nephropathy: Secondary | ICD-10-CM | POA: Diagnosis not present

## 2023-08-09 DIAGNOSIS — G8929 Other chronic pain: Secondary | ICD-10-CM | POA: Diagnosis not present

## 2023-08-09 DIAGNOSIS — Z79891 Long term (current) use of opiate analgesic: Secondary | ICD-10-CM | POA: Diagnosis not present

## 2023-08-09 DIAGNOSIS — H547 Unspecified visual loss: Secondary | ICD-10-CM | POA: Diagnosis not present

## 2023-08-09 DIAGNOSIS — R269 Unspecified abnormalities of gait and mobility: Secondary | ICD-10-CM | POA: Diagnosis not present

## 2023-08-09 DIAGNOSIS — M5441 Lumbago with sciatica, right side: Secondary | ICD-10-CM | POA: Diagnosis not present

## 2023-08-09 DIAGNOSIS — I10 Essential (primary) hypertension: Secondary | ICD-10-CM | POA: Diagnosis not present

## 2023-08-09 DIAGNOSIS — Z7984 Long term (current) use of oral hypoglycemic drugs: Secondary | ICD-10-CM | POA: Diagnosis not present

## 2023-08-09 DIAGNOSIS — S80812D Abrasion, left lower leg, subsequent encounter: Secondary | ICD-10-CM | POA: Diagnosis not present

## 2023-08-09 NOTE — Telephone Encounter (Signed)
Rhonda from [facility] called and said they would like the appointment notes from the patient's last 2 visits faxed to them at [phone]. Best callback is [phone].

## 2023-08-09 NOTE — Telephone Encounter (Signed)
Faxed ov notes to [phone].Marland KitchenRaechel Chute

## 2023-08-12 ENCOUNTER — Ambulatory Visit: Payer: Medicare PPO | Admitting: Internal Medicine

## 2023-08-12 ENCOUNTER — Encounter: Payer: Self-pay | Admitting: Internal Medicine

## 2023-08-12 VITALS — BP 116/74 | HR 59 | Temp 97.6°F | Ht 62.0 in | Wt 105.0 lb

## 2023-08-12 DIAGNOSIS — Z7984 Long term (current) use of oral hypoglycemic drugs: Secondary | ICD-10-CM | POA: Diagnosis not present

## 2023-08-12 DIAGNOSIS — G8929 Other chronic pain: Secondary | ICD-10-CM

## 2023-08-12 DIAGNOSIS — M5441 Lumbago with sciatica, right side: Secondary | ICD-10-CM | POA: Diagnosis not present

## 2023-08-12 DIAGNOSIS — R296 Repeated falls: Secondary | ICD-10-CM | POA: Diagnosis not present

## 2023-08-12 DIAGNOSIS — E1121 Type 2 diabetes mellitus with diabetic nephropathy: Secondary | ICD-10-CM

## 2023-08-12 DIAGNOSIS — R634 Abnormal weight loss: Secondary | ICD-10-CM

## 2023-08-12 DIAGNOSIS — R269 Unspecified abnormalities of gait and mobility: Secondary | ICD-10-CM

## 2023-08-12 DIAGNOSIS — I10 Essential (primary) hypertension: Secondary | ICD-10-CM

## 2023-08-12 MED ORDER — LOSARTAN POTASSIUM 100 MG PO TABS: 50.0000 mg | ORAL_TABLET | Freq: Every day | ORAL | Status: DC

## 2023-08-12 MED ORDER — GABAPENTIN 100 MG PO CAPS: 100.0000 mg | ORAL_CAPSULE | Freq: Two times a day (BID) | ORAL | 5 refills | Status: DC

## 2023-08-12 NOTE — Assessment & Plan Note (Signed)
Chronic On Metformin 

## 2023-08-12 NOTE — Assessment & Plan Note (Signed)
In PT On Gabapentin 100 mg bid

## 2023-08-12 NOTE — Progress Notes (Signed)
Subjective:  Patient ID: Breanna [name], female    DOB: 1931/06/05  Age: 89 y.o. MRN: [id number]  CC: Medical Management of Chronic Issues (4 month f/u)   HPI [employer] presents for constipation, HTN, DM  Outpatient Medications Prior to Visit  Medication Sig Dispense Refill   acetaminophen (TYLENOL) 325 MG tablet Take 2 tablets (650 mg total) by mouth every 6 (six) hours as needed for moderate pain or fever.     Alcohol Swabs (TRUE COMFORT ALCOHOL PREP PADS) 70 % PADS Use as directed 300 each 1   allopurinol (ZYLOPRIM) 100 MG tablet TAKE 1 TABLET BY MOUTH EVERY DAY 90 tablet 1   amLODipine (NORVASC) 5 MG tablet Take 1 tablet (5 mg total) by mouth daily. 90 tablet 3   ascorbic acid (VITAMIN C) 500 MG tablet TAKE 1 TABLET BY MOUTH EVERY DAY 30 tablet 11   ASPIRIN LOW DOSE 81 MG tablet TAKE 1 TABLET BY MOUTH DAILY. SWALLOW WHOLE. 90 tablet 3   atropine 1 % ophthalmic solution Place 1 drop into both eyes daily.     Blood Glucose Calibration (TRUE METRIX LEVEL 1) Low SOLN USE AS DIRECTED 3 each 3   brimonidine (ALPHAGAN) 0.2 % ophthalmic solution Place 1 drop into the right eye 2 (two) times daily.     calcium carbonate (OSCAL) 1500 (600 Ca) MG TABS tablet TAKE 1 TABLET BY MOUTH EVERY DAY 90 tablet 3   Cholecalciferol (VITAMIN D3) 25 MCG (1000 UT) CAPS Take 1 capsule (1,000 Units total) by mouth daily. 90 capsule 3   [employer] VITAMIN B12 1000 MCG tablet TAKE 1 TABLET (1,000 MCG TOTAL) BY MOUTH EVERY DAY 90 tablet 3   diclofenac sodium (VOLTAREN) 1 % GEL Apply 2 g topically 3 (three) times daily. To left shoulder 200 g 3   docusate sodium (COLACE) 100 MG capsule TAKE 1 CAPSULE BY MOUTH TWICE A DAY 180 capsule 3   dorzolamide-timolol (COSOPT) 22.3-6.8 MG/ML ophthalmic solution Place 1 drop into both eyes 2 (two) times daily.      feeding supplement, ENSURE ENLIVE, (ENSURE ENLIVE) LIQD Take 237 mLs by mouth daily at 2 PM. 237 mL 12   glucose blood (GNP TRUE METRIX GLUCOSE STRIPS) test strip Use  to check blood sugars twice a day 200 each 2   latanoprost (XALATAN) 0.005 % ophthalmic solution Place 1 drop into both eyes at bedtime.      lidocaine (LIDODERM) 5 % Place 1 patch onto the skin daily. Remove & Discard patch within 12 hours or as directed by MD. Apply to left shoulder 30 patch 0   lidocaine-prilocaine (EMLA) cream Apply 1 application topically as needed. 30 g 0   metFORMIN (GLUCOPHAGE) 500 MG tablet TAKE 1 TABLET BY MOUTH EVERY DAY WITH BREAKFAST 90 tablet 1   mupirocin ointment (BACTROBAN) 2 % On leg wound w/dressing change qd or bid 30 g 0   Netarsudil-Latanoprost 0.02-0.005 % SOLN Apply to eye.     NON FORMULARY Place 10 drops under the tongue daily as needed (Pain). Medication: CBD oil     pantoprazole (PROTONIX) 40 MG tablet TAKE 1 TABLET BY MOUTH EVERY DAY 90 tablet 3   polyethylene glycol (MIRALAX / GLYCOLAX) 17 g packet Take 17 g by mouth daily as needed for mild constipation. 14 each 0   senna (SENOKOT) 8.6 MG TABS tablet Take 1 tablet (8.6 mg total) by mouth 2 (two) times daily. 120 tablet 0   sertraline (ZOLOFT) 100 MG tablet TAKE  1 TABLET BY MOUTH EVERY DAY 90 tablet 1   spironolactone (ALDACTONE) 25 MG tablet TAKE 1 TAB BY MOUTH MON - WED - FRI 90 tablet 1   traMADol (ULTRAM) 50 MG tablet Take 1 tablet (50 mg total) by mouth every 8 (eight) hours as needed for severe pain. 30 tablet 0   TRUEplus Lancets 33G MISC USE AS DIRECTED TO CHECK BLOOD SUGAR TWICE A DAY 200 each 1   valACYclovir (VALTREX) 500 MG tablet Take 1 tablet (500 mg total) by mouth daily as needed (for outbreaks).     gabapentin (NEURONTIN) 100 MG capsule TAKE 1 CAPSULE BY MOUTH THREE TIMES A DAY 90 capsule 5   losartan (COZAAR) 100 MG tablet Take 1 tablet (100 mg total) by mouth daily. 90 tablet 3   Facility-Administered Medications Prior to Visit  Medication Dose Route Frequency Provider Last Rate Last Admin   regadenoson (LEXISCAN) injection SOLN 0.4 mg  0.4 mg Intravenous Once Meriam Sprague, MD       technetium tetrofosmin (TC-MYOVIEW) injection 31.1 millicurie  31.1 millicurie Intravenous Once PRN Meriam Sprague, MD        ROS: Review of Systems  Constitutional:  Positive for unexpected weight change. Negative for activity change, appetite change, chills and fatigue.  HENT:  Negative for congestion, mouth sores and sinus pressure.   Eyes:  Positive for visual disturbance.  Respiratory:  Negative for cough and chest tightness.   Gastrointestinal:  Negative for abdominal pain and nausea.  Genitourinary:  Negative for difficulty urinating, frequency and vaginal pain.  Musculoskeletal:  Positive for back pain and gait problem.  Skin:  Negative for pallor and rash.  Neurological:  Negative for dizziness, tremors, weakness, numbness and headaches.  Psychiatric/Behavioral:  Negative for confusion and sleep disturbance. The patient is not nervous/anxious.     Objective:  BP 116/74 (BP Location: Left Arm, Patient Position: Sitting, Cuff Size: Normal)   Pulse (!) 59   Temp 97.6 F (36.4 C) (Temporal)   Ht 5\' 2"  (1.575 m)   Wt 105 lb (47.6 kg)   SpO2 100%   BMI 19.20 kg/m   BP Readings from Last 3 Encounters:  08/12/23 116/74  07/11/23 110/78  06/14/23 (!) 168/84    Wt Readings from Last 3 Encounters:  08/12/23 105 lb (47.6 kg)  07/11/23 121 lb (54.9 kg)  06/14/23 115 lb (52.2 kg)    Physical Exam Constitutional:      General: She is not in acute distress.    Appearance: She is well-developed.  HENT:     Head: Normocephalic.     Right Ear: External ear normal.     Left Ear: External ear normal.     Nose: Nose normal.  Eyes:     General:        Right eye: No discharge.        Left eye: No discharge.     Conjunctiva/sclera: Conjunctivae normal.     Pupils: Pupils are equal, round, and reactive to light.  Neck:     Thyroid: No thyromegaly.     Vascular: No JVD.     Trachea: No tracheal deviation.  Cardiovascular:     Rate and Rhythm: Normal rate  and regular rhythm.     Heart sounds: Normal heart sounds.  Pulmonary:     Effort: No respiratory distress.     Breath sounds: No stridor. No wheezing.  Abdominal:     General: Bowel sounds are normal. There is no  distension.     Palpations: Abdomen is soft. There is no mass.     Tenderness: There is no abdominal tenderness. There is no guarding or rebound.  Musculoskeletal:        General: Tenderness present.     Cervical back: Normal range of motion and neck supple. No rigidity.  Lymphadenopathy:     Cervical: No cervical adenopathy.  Skin:    Findings: No erythema or rash.  Neurological:     Cranial Nerves: No cranial nerve deficit.     Motor: No abnormal muscle tone.     Coordination: Coordination abnormal.     Gait: Gait abnormal.     Deep Tendon Reflexes: Reflexes normal.  Psychiatric:        Behavior: Behavior normal.        Thought Content: Thought content normal.        Judgment: Judgment normal.   In a w/c Visually impaired LS w/pain  Lab Results  Component Value Date   WBC 3.9 (L) 07/11/2023   HGB 11.1 (L) 07/11/2023   HCT 35.6 (L) 07/11/2023   PLT 128.0 (L) 07/11/2023   GLUCOSE 100 (H) 07/11/2023   CHOL 127 03/01/2023   TRIG 42 03/01/2023   HDL 53 03/01/2023   LDLCALC 66 03/01/2023   ALT 8 07/11/2023   AST 18 07/11/2023   NA 140 07/11/2023   K 4.7 07/11/2023   CL 104 07/11/2023   CREATININE 1.26 (H) 07/11/2023   BUN 27 (H) 07/11/2023   CO2 29 07/11/2023   TSH 0.52 07/11/2023   HGBA1C 6.2 02/13/2023    VAS [place] LOWER EXTREMITY VENOUS (DVT)  Result Date: 03/03/2023  Lower Venous DVT Study Patient Name:  MINORI FLEES  Date of Exam:   03/01/2023 Medical Rec #: [id number]        Accession #:    [id number] Date of Birth: 1931/07/29         Patient Gender: F Patient Age:   54 years Exam Location:  [facility] Procedure:      VAS [place] LOWER EXTREMITY VENOUS (DVT) Referring Phys: RIPUDEEP RAI  --------------------------------------------------------------------------------  Indications: Edema.  Risk Factors: None identified. Limitations: Poor ultrasound/tissue interface. Comparison Study: No prior studies. Performing Technologist: Chanda Busing RVT  Examination Guidelines: A complete evaluation includes B-mode imaging, spectral Doppler, color Doppler, and power Doppler as needed of all accessible portions of each vessel. Bilateral testing is considered an integral part of a complete examination. Limited examinations for reoccurring indications may be performed as noted. The reflux portion of the exam is performed with the patient in reverse Trendelenburg.  +---------+---------------+---------+-----------+----------+--------------+ RIGHT    CompressibilityPhasicitySpontaneityPropertiesThrombus Aging +---------+---------------+---------+-----------+----------+--------------+ CFV      Full           Yes      Yes                                 +---------+---------------+---------+-----------+----------+--------------+ SFJ      Full                                                        +---------+---------------+---------+-----------+----------+--------------+ FV Prox  Full                                                        +---------+---------------+---------+-----------+----------+--------------+  FV Mid   Full                                                        +---------+---------------+---------+-----------+----------+--------------+ FV DistalFull                                                        +---------+---------------+---------+-----------+----------+--------------+ PFV      Full                                                        +---------+---------------+---------+-----------+----------+--------------+ POP      Full           Yes      Yes                                  +---------+---------------+---------+-----------+----------+--------------+ PTV      Full                                                        +---------+---------------+---------+-----------+----------+--------------+ PERO     Full                                                        +---------+---------------+---------+-----------+----------+--------------+   +---------+---------------+---------+-----------+----------+--------------+ LEFT     CompressibilityPhasicitySpontaneityPropertiesThrombus Aging +---------+---------------+---------+-----------+----------+--------------+ CFV      Full           Yes      Yes                                 +---------+---------------+---------+-----------+----------+--------------+ SFJ      Full                                                        +---------+---------------+---------+-----------+----------+--------------+ FV Prox  Full                                                        +---------+---------------+---------+-----------+----------+--------------+ FV Mid   Full                                                        +---------+---------------+---------+-----------+----------+--------------+  FV DistalFull                                                        +---------+---------------+---------+-----------+----------+--------------+ PFV      Full                                                        +---------+---------------+---------+-----------+----------+--------------+ POP      Full           Yes      Yes                                 +---------+---------------+---------+-----------+----------+--------------+ PTV      Full                                                        +---------+---------------+---------+-----------+----------+--------------+ PERO     Full                                                         +---------+---------------+---------+-----------+----------+--------------+     Summary: RIGHT: - There is no evidence of deep vein thrombosis in the lower extremity.  - No cystic structure found in the popliteal fossa.  LEFT: - There is no evidence of deep vein thrombosis in the lower extremity.  - No cystic structure found in the popliteal fossa.  *See table(s) above for measurements and observations. Electronically signed by Heath Lark on 03/03/2023 at 4:16:14 PM.    Final    CT Angio Chest Pulmonary Embolism (PE) W or WO Contrast  Result Date: 03/01/2023 CLINICAL DATA:  Lungs are intermediate probability for PE. Positive D-dimer. EXAM: CT ANGIOGRAPHY CHEST WITH CONTRAST TECHNIQUE: Multidetector CT imaging of the chest was performed using the standard protocol during bolus administration of intravenous contrast. Multiplanar CT image reconstructions and MIPs were obtained to evaluate the vascular anatomy. RADIATION DOSE REDUCTION: This exam was performed according to the departmental dose-optimization program which includes automated exposure control, adjustment of the mA and/or kV according to patient size and/or use of iterative reconstruction technique. CONTRAST:  75mL OMNIPAQUE IOHEXOL 350 MG/ML SOLN COMPARISON:  Chest CT 08/15/2017 FINDINGS: Cardiovascular: Heart is enlarged. Aortic is ectatic without focal dilatation. There is adequate opacification of the pulmonary arteries to the segmental level. There is no evidence for pulmonary embolism. There are atherosclerotic calcifications of the aorta. Mediastinum/Nodes: No enlarged mediastinal, hilar, or axillary lymph nodes. Esophagus and trachea are within normal limits. The thyroid gland is diffusely heterogeneous. There are calcified mediastinal and right hilar lymph nodes. Lungs/Pleura: Lungs are clear. No pleural effusion or pneumothorax. Upper Abdomen: No acute abnormality. There are calcified granulomas in the liver and spleen. Rounded mildly  hyperdense area in the superior pole of the left kidney is new from  prior measuring 9 mm. Musculoskeletal: The bones are osteopenic. Degenerative changes affect the spine. Review of the MIP images confirms the above findings. IMPRESSION: 1. No evidence for pulmonary embolism or other acute cardiopulmonary process. 2. Cardiomegaly. 3. New 9 mm hyperdense lesion in the superior pole of the left kidney. This may represent a hyperdense cyst, but is indeterminate. This can be further evaluated with ultrasound. Aortic Atherosclerosis (ICD10-I70.0). Electronically Signed   By: Darliss Cheney M.D.   On: 03/01/2023 21:40   ECHOCARDIOGRAM COMPLETE  Result Date: 03/01/2023    ECHOCARDIOGRAM REPORT   Patient Name:   TAMRAH PINKETT Date of Exam: 03/01/2023 Medical Rec #:  [id number]       Height:       62.0 in Accession #:    [id number]      Weight:       113.8 lb Date of Birth:  1931/05/06        BSA:          1.504 m Patient Age:    89 years        BP:           147/70 mmHg Patient Gender: F               HR:           51 bpm. Exam Location:  Inpatient Procedure: 2D Echo, Cardiac Doppler and Color Doppler Indications:    elevated troponin  History:        Patient has no prior history of Echocardiogram examinations.                 Stroke; Risk Factors:Hypertension and Dyslipidemia.  Sonographer:    Mike Gip Referring Phys: [id number] JUSTIN B HOWERTER IMPRESSIONS  1. Left ventricular ejection fraction, by estimation, is 55 to 60%. The left ventricle has normal function. The left ventricle has no regional wall motion abnormalities. Left ventricular diastolic parameters are consistent with Grade II diastolic dysfunction (pseudonormalization).  2. Right ventricular systolic function is normal. The right ventricular size is normal. There is mildly elevated pulmonary artery systolic pressure. The estimated right ventricular systolic pressure is 39.7 mmHg.  3. Left atrial size was moderately dilated.  4. The mitral valve is  degenerative. Mild to moderate mitral valve regurgitation. No evidence of mitral stenosis.  5. Tricuspid valve regurgitation is moderate.  6. The aortic valve is tricuspid. There is moderate calcification of the aortic valve. Aortic valve regurgitation is trivial. No aortic stenosis is present.  7. The inferior vena cava is dilated in size with >50% respiratory variability, suggesting right atrial pressure of 8 mmHg. FINDINGS  Left Ventricle: Left ventricular ejection fraction, by estimation, is 55 to 60%. The left ventricle has normal function. The left ventricle has no regional wall motion abnormalities. The left ventricular internal cavity size was normal in size. There is  no left ventricular hypertrophy. Left ventricular diastolic parameters are consistent with Grade II diastolic dysfunction (pseudonormalization). Right Ventricle: The right ventricular size is normal. No increase in right ventricular wall thickness. Right ventricular systolic function is normal. There is mildly elevated pulmonary artery systolic pressure. The tricuspid regurgitant velocity is 3.03  m/s, and with an assumed right atrial pressure of 3 mmHg, the estimated right ventricular systolic pressure is 39.7 mmHg. Left Atrium: Left atrial size was moderately dilated. Right Atrium: Right atrial size was normal in size. Pericardium: There is no evidence of pericardial effusion. Mitral Valve: The mitral valve is degenerative in appearance. Mild  to moderate mitral valve regurgitation, with posteriorly-directed jet. No evidence of mitral valve stenosis. Tricuspid Valve: The tricuspid valve is normal in structure. Tricuspid valve regurgitation is moderate . No evidence of tricuspid stenosis. Aortic Valve: The aortic valve is tricuspid. There is moderate calcification of the aortic valve. Aortic valve regurgitation is trivial. Aortic regurgitation PHT measures 763 msec. No aortic stenosis is present. Pulmonic Valve: The pulmonic valve was not well  visualized. Pulmonic valve regurgitation is trivial. No evidence of pulmonic stenosis. Aorta: The aortic root is normal in size and structure. Venous: The inferior vena cava is dilated in size with greater than 50% respiratory variability, suggesting right atrial pressure of 8 mmHg. IAS/Shunts: No atrial level shunt detected by color flow Doppler.  LEFT VENTRICLE PLAX 2D LVIDd:         4.20 cm      Diastology LVIDs:         2.90 cm      LV e' medial:    4.05 cm/s LV PW:         1.30 cm      LV E/e' medial:  18.1 LV IVS:        1.40 cm      LV e' lateral:   5.43 cm/s LVOT diam:     1.90 cm      LV E/e' lateral: 13.5 LV SV:         64 LV SV Index:   42 LVOT Area:     2.84 cm  LV Volumes (MOD) LV vol d, MOD A2C: 128.0 ml LV vol d, MOD A4C: 110.0 ml LV vol s, MOD A2C: 51.8 ml LV vol s, MOD A4C: 38.1 ml LV SV MOD A2C:     76.2 ml LV SV MOD A4C:     110.0 ml LV SV MOD BP:      76.8 ml RIGHT VENTRICLE             IVC RV Basal diam:  3.90 cm     IVC diam: 2.10 cm RV S prime:     11.10 cm/s TAPSE (M-mode): 1.7 cm LEFT ATRIUM             Index        RIGHT ATRIUM           Index LA diam:        3.80 cm 2.53 cm/m   RA Area:     12.70 cm LA Vol (A2C):   62.6 ml 41.62 ml/m  RA Volume:   26.30 ml  17.49 ml/m LA Vol (A4C):   81.0 ml 53.85 ml/m LA Biplane Vol: 75.6 ml 50.26 ml/m  AORTIC VALVE LVOT Vmax:   96.00 cm/s LVOT Vmean:  60.100 cm/s LVOT VTI:    0.225 m AI PHT:      763 msec  AORTA Ao Root diam: 3.00 cm Ao Asc diam:  2.70 cm MITRAL VALVE                  TRICUSPID VALVE MV Area (PHT): 3.03 cm       TR Peak grad:   36.7 mmHg MV Decel Time: 250 msec       TR Vmax:        303.00 cm/s MR Peak grad:    159.3 mmHg MR Mean grad:    99.0 mmHg    SHUNTS MR Vmax:         631.00 cm/s  Systemic VTI:  0.22 m  MR Vmean:        469.0 cm/s   Systemic Diam: 1.90 cm MR PISA:         1.57 cm MR PISA Eff ROA: 8 mm MR PISA Radius:  0.50 cm MV E velocity: 73.20 cm/s MV A velocity: 51.90 cm/s MV E/A ratio:  1.41 Arvilla Meres MD  Electronically signed by Arvilla Meres MD Signature Date/Time: 03/01/2023/9:39:08 AM    Final    DG Shoulder Left  Result Date: 03/01/2023 CLINICAL DATA:  Left shoulder pain.  No known injury EXAM: LEFT SHOULDER - 2 VIEW COMPARISON:  None Available. FINDINGS: There is no evidence of fracture or dislocation. Mild superior subluxation of the humeral head relative to the glenoid. Degenerative changes of the acromioclavicular joint. Soft tissues are unremarkable. IMPRESSION: 1. Mild superior subluxation of the humeral head relative to the glenoid, which can be seen with rotator cuff pathology. Degenerative changes of the acromioclavicular joint. 2.  No acute fracture or dislocation. Electronically Signed   By: Agustin Cree M.D.   On: 03/01/2023 08:17   DG Chest 2 View  Result Date: 02/28/2023 CLINICAL DATA:  Left shoulder pain. EXAM: CHEST - 2 VIEW COMPARISON:  Radiograph 08/24/2022 FINDINGS: Stable heart size allowing for AP technique. Unchanged mediastinal contours. Linear subsegmental opacity in the left upper and lower lobes typical of atelectasis. No pneumothorax or pleural effusion. Normal pulmonary vasculature. Thoracic spondylosis with anterior spurring. There is mild acromioclavicular degenerative change of both shoulders. IMPRESSION: Linear subsegmental atelectasis in the left upper and lower lobes. Electronically Signed   By: Narda Rutherford M.D.   On: 02/28/2023 15:10    Assessment & Plan:   Problem List Items Addressed This Visit     DM2 (diabetes mellitus, type 2) (HCC) - Primary    Chronic On Metformin      Relevant Medications   losartan (COZAAR) 100 MG tablet   Essential hypertension    Spironolactone 3/wk, Losartan 1/2 tab a day - low BP BP is low nl      Relevant Medications   losartan (COZAAR) 100 MG tablet   LOW BACK PAIN    In PT On Gabapentin 100 mg bid      Weight loss    Will continue to watch. Probably normal wt for age vs other Recent labs were reviewed        Falls frequently    Better In PT      Gait disorder    In a w/c         Meds ordered this encounter  Medications   gabapentin (NEURONTIN) 100 MG capsule    Sig: Take 1 capsule (100 mg total) by mouth 2 (two) times daily.    Dispense:  180 capsule    Refill:  5   losartan (COZAAR) 100 MG tablet    Sig: Take 0.5 tablets (50 mg total) by mouth daily.      Follow-up: Return in about 3 months (around 11/12/2023) for a follow-up visit.  Sonda Primes, MD

## 2023-08-12 NOTE — Assessment & Plan Note (Addendum)
Spironolactone 3/wk, Losartan 1/2 tab a day - low BP BP is low nl

## 2023-08-12 NOTE — Assessment & Plan Note (Signed)
Will continue to watch. Probably normal wt for age vs other Recent labs were reviewed

## 2023-08-12 NOTE — Assessment & Plan Note (Signed)
In a w/c 

## 2023-08-12 NOTE — Assessment & Plan Note (Signed)
Better In PT

## 2023-08-13 DIAGNOSIS — H547 Unspecified visual loss: Secondary | ICD-10-CM | POA: Diagnosis not present

## 2023-08-13 DIAGNOSIS — I10 Essential (primary) hypertension: Secondary | ICD-10-CM | POA: Diagnosis not present

## 2023-08-13 DIAGNOSIS — R269 Unspecified abnormalities of gait and mobility: Secondary | ICD-10-CM | POA: Diagnosis not present

## 2023-08-13 DIAGNOSIS — Z79891 Long term (current) use of opiate analgesic: Secondary | ICD-10-CM | POA: Diagnosis not present

## 2023-08-13 DIAGNOSIS — S80812D Abrasion, left lower leg, subsequent encounter: Secondary | ICD-10-CM | POA: Diagnosis not present

## 2023-08-13 DIAGNOSIS — Z7984 Long term (current) use of oral hypoglycemic drugs: Secondary | ICD-10-CM | POA: Diagnosis not present

## 2023-08-13 DIAGNOSIS — E1121 Type 2 diabetes mellitus with diabetic nephropathy: Secondary | ICD-10-CM | POA: Diagnosis not present

## 2023-08-13 DIAGNOSIS — M5441 Lumbago with sciatica, right side: Secondary | ICD-10-CM | POA: Diagnosis not present

## 2023-08-13 DIAGNOSIS — G8929 Other chronic pain: Secondary | ICD-10-CM | POA: Diagnosis not present

## 2023-08-14 DIAGNOSIS — S80812D Abrasion, left lower leg, subsequent encounter: Secondary | ICD-10-CM | POA: Diagnosis not present

## 2023-08-14 DIAGNOSIS — R269 Unspecified abnormalities of gait and mobility: Secondary | ICD-10-CM | POA: Diagnosis not present

## 2023-08-14 DIAGNOSIS — E1121 Type 2 diabetes mellitus with diabetic nephropathy: Secondary | ICD-10-CM | POA: Diagnosis not present

## 2023-08-14 DIAGNOSIS — G8929 Other chronic pain: Secondary | ICD-10-CM | POA: Diagnosis not present

## 2023-08-14 DIAGNOSIS — H547 Unspecified visual loss: Secondary | ICD-10-CM | POA: Diagnosis not present

## 2023-08-14 DIAGNOSIS — M5441 Lumbago with sciatica, right side: Secondary | ICD-10-CM | POA: Diagnosis not present

## 2023-08-14 DIAGNOSIS — Z7984 Long term (current) use of oral hypoglycemic drugs: Secondary | ICD-10-CM | POA: Diagnosis not present

## 2023-08-14 DIAGNOSIS — I10 Essential (primary) hypertension: Secondary | ICD-10-CM | POA: Diagnosis not present

## 2023-08-14 DIAGNOSIS — Z9181 History of falling: Secondary | ICD-10-CM

## 2023-08-14 DIAGNOSIS — Z79891 Long term (current) use of opiate analgesic: Secondary | ICD-10-CM | POA: Diagnosis not present

## 2023-08-21 DIAGNOSIS — R269 Unspecified abnormalities of gait and mobility: Secondary | ICD-10-CM | POA: Diagnosis not present

## 2023-08-21 DIAGNOSIS — E1121 Type 2 diabetes mellitus with diabetic nephropathy: Secondary | ICD-10-CM | POA: Diagnosis not present

## 2023-08-21 DIAGNOSIS — I10 Essential (primary) hypertension: Secondary | ICD-10-CM | POA: Diagnosis not present

## 2023-08-21 DIAGNOSIS — S80812D Abrasion, left lower leg, subsequent encounter: Secondary | ICD-10-CM | POA: Diagnosis not present

## 2023-08-21 DIAGNOSIS — H547 Unspecified visual loss: Secondary | ICD-10-CM | POA: Diagnosis not present

## 2023-08-21 DIAGNOSIS — G8929 Other chronic pain: Secondary | ICD-10-CM | POA: Diagnosis not present

## 2023-08-21 DIAGNOSIS — M5441 Lumbago with sciatica, right side: Secondary | ICD-10-CM | POA: Diagnosis not present

## 2023-08-21 DIAGNOSIS — Z7984 Long term (current) use of oral hypoglycemic drugs: Secondary | ICD-10-CM | POA: Diagnosis not present

## 2023-08-21 DIAGNOSIS — Z79891 Long term (current) use of opiate analgesic: Secondary | ICD-10-CM | POA: Diagnosis not present

## 2023-08-22 DIAGNOSIS — E1121 Type 2 diabetes mellitus with diabetic nephropathy: Secondary | ICD-10-CM | POA: Diagnosis not present

## 2023-08-22 DIAGNOSIS — R269 Unspecified abnormalities of gait and mobility: Secondary | ICD-10-CM | POA: Diagnosis not present

## 2023-08-22 DIAGNOSIS — I10 Essential (primary) hypertension: Secondary | ICD-10-CM | POA: Diagnosis not present

## 2023-08-22 DIAGNOSIS — S80812D Abrasion, left lower leg, subsequent encounter: Secondary | ICD-10-CM | POA: Diagnosis not present

## 2023-08-22 DIAGNOSIS — Z7984 Long term (current) use of oral hypoglycemic drugs: Secondary | ICD-10-CM | POA: Diagnosis not present

## 2023-08-22 DIAGNOSIS — M5441 Lumbago with sciatica, right side: Secondary | ICD-10-CM | POA: Diagnosis not present

## 2023-08-22 DIAGNOSIS — Z79891 Long term (current) use of opiate analgesic: Secondary | ICD-10-CM | POA: Diagnosis not present

## 2023-08-22 DIAGNOSIS — G8929 Other chronic pain: Secondary | ICD-10-CM | POA: Diagnosis not present

## 2023-08-22 DIAGNOSIS — H547 Unspecified visual loss: Secondary | ICD-10-CM | POA: Diagnosis not present

## 2023-08-27 DIAGNOSIS — S80812D Abrasion, left lower leg, subsequent encounter: Secondary | ICD-10-CM | POA: Diagnosis not present

## 2023-08-27 DIAGNOSIS — I10 Essential (primary) hypertension: Secondary | ICD-10-CM | POA: Diagnosis not present

## 2023-08-27 DIAGNOSIS — R269 Unspecified abnormalities of gait and mobility: Secondary | ICD-10-CM | POA: Diagnosis not present

## 2023-08-27 DIAGNOSIS — G8929 Other chronic pain: Secondary | ICD-10-CM | POA: Diagnosis not present

## 2023-08-27 DIAGNOSIS — Z79891 Long term (current) use of opiate analgesic: Secondary | ICD-10-CM | POA: Diagnosis not present

## 2023-08-27 DIAGNOSIS — H547 Unspecified visual loss: Secondary | ICD-10-CM | POA: Diagnosis not present

## 2023-08-27 DIAGNOSIS — E1121 Type 2 diabetes mellitus with diabetic nephropathy: Secondary | ICD-10-CM | POA: Diagnosis not present

## 2023-08-27 DIAGNOSIS — Z7984 Long term (current) use of oral hypoglycemic drugs: Secondary | ICD-10-CM | POA: Diagnosis not present

## 2023-08-27 DIAGNOSIS — M5441 Lumbago with sciatica, right side: Secondary | ICD-10-CM | POA: Diagnosis not present

## 2023-08-30 DIAGNOSIS — H547 Unspecified visual loss: Secondary | ICD-10-CM | POA: Diagnosis not present

## 2023-08-30 DIAGNOSIS — E1121 Type 2 diabetes mellitus with diabetic nephropathy: Secondary | ICD-10-CM | POA: Diagnosis not present

## 2023-08-30 DIAGNOSIS — M5441 Lumbago with sciatica, right side: Secondary | ICD-10-CM | POA: Diagnosis not present

## 2023-08-30 DIAGNOSIS — S80812D Abrasion, left lower leg, subsequent encounter: Secondary | ICD-10-CM | POA: Diagnosis not present

## 2023-08-30 DIAGNOSIS — G8929 Other chronic pain: Secondary | ICD-10-CM | POA: Diagnosis not present

## 2023-08-30 DIAGNOSIS — I10 Essential (primary) hypertension: Secondary | ICD-10-CM | POA: Diagnosis not present

## 2023-08-30 DIAGNOSIS — R269 Unspecified abnormalities of gait and mobility: Secondary | ICD-10-CM | POA: Diagnosis not present

## 2023-08-30 DIAGNOSIS — Z7984 Long term (current) use of oral hypoglycemic drugs: Secondary | ICD-10-CM | POA: Diagnosis not present

## 2023-08-30 DIAGNOSIS — Z79891 Long term (current) use of opiate analgesic: Secondary | ICD-10-CM | POA: Diagnosis not present

## 2023-09-05 DIAGNOSIS — Z79891 Long term (current) use of opiate analgesic: Secondary | ICD-10-CM | POA: Diagnosis not present

## 2023-09-05 DIAGNOSIS — H547 Unspecified visual loss: Secondary | ICD-10-CM | POA: Diagnosis not present

## 2023-09-05 DIAGNOSIS — S80812D Abrasion, left lower leg, subsequent encounter: Secondary | ICD-10-CM | POA: Diagnosis not present

## 2023-09-05 DIAGNOSIS — R269 Unspecified abnormalities of gait and mobility: Secondary | ICD-10-CM | POA: Diagnosis not present

## 2023-09-05 DIAGNOSIS — Z7984 Long term (current) use of oral hypoglycemic drugs: Secondary | ICD-10-CM | POA: Diagnosis not present

## 2023-09-05 DIAGNOSIS — E1121 Type 2 diabetes mellitus with diabetic nephropathy: Secondary | ICD-10-CM | POA: Diagnosis not present

## 2023-09-05 DIAGNOSIS — I10 Essential (primary) hypertension: Secondary | ICD-10-CM | POA: Diagnosis not present

## 2023-09-05 DIAGNOSIS — G8929 Other chronic pain: Secondary | ICD-10-CM | POA: Diagnosis not present

## 2023-09-05 DIAGNOSIS — M5441 Lumbago with sciatica, right side: Secondary | ICD-10-CM | POA: Diagnosis not present

## 2023-09-06 ENCOUNTER — Telehealth: Payer: Self-pay | Admitting: Internal Medicine

## 2023-09-06 DIAGNOSIS — R269 Unspecified abnormalities of gait and mobility: Secondary | ICD-10-CM | POA: Diagnosis not present

## 2023-09-06 DIAGNOSIS — G8929 Other chronic pain: Secondary | ICD-10-CM | POA: Diagnosis not present

## 2023-09-06 DIAGNOSIS — M5441 Lumbago with sciatica, right side: Secondary | ICD-10-CM | POA: Diagnosis not present

## 2023-09-06 DIAGNOSIS — S80812D Abrasion, left lower leg, subsequent encounter: Secondary | ICD-10-CM | POA: Diagnosis not present

## 2023-09-06 DIAGNOSIS — I10 Essential (primary) hypertension: Secondary | ICD-10-CM | POA: Diagnosis not present

## 2023-09-06 DIAGNOSIS — H547 Unspecified visual loss: Secondary | ICD-10-CM | POA: Diagnosis not present

## 2023-09-06 DIAGNOSIS — E1121 Type 2 diabetes mellitus with diabetic nephropathy: Secondary | ICD-10-CM | POA: Diagnosis not present

## 2023-09-06 DIAGNOSIS — Z7984 Long term (current) use of oral hypoglycemic drugs: Secondary | ICD-10-CM | POA: Diagnosis not present

## 2023-09-06 DIAGNOSIS — Z79891 Long term (current) use of opiate analgesic: Secondary | ICD-10-CM | POA: Diagnosis not present

## 2023-09-06 NOTE — Telephone Encounter (Signed)
HH ORDERS   Caller Name: [facility] Agency Name: [facility] Phone #: [phone](secure)  Service Requested: OT (examples: OT/PT/Skilled Nursing/Social Work/Speech Therapy/Wound Care)  Frequency of Visits: 1X a week for 4 weeks, 1X every other week for 4 weeks

## 2023-09-07 NOTE — Telephone Encounter (Signed)
Okay. Thank you.

## 2023-09-09 NOTE — Telephone Encounter (Signed)
I was able to speak with Breanna [name] and give her the verbal Ok per provider for OT Frequency of Visits: 1X a week for 4 weeks, 1X every other week for 4 weeks

## 2023-09-11 ENCOUNTER — Ambulatory Visit: Payer: Medicare PPO | Admitting: Internal Medicine

## 2023-09-11 ENCOUNTER — Encounter: Payer: Self-pay | Admitting: Internal Medicine

## 2023-09-11 VITALS — BP 130/70 | HR 59 | Temp 97.9°F | Ht 62.0 in | Wt 105.0 lb

## 2023-09-11 DIAGNOSIS — E1121 Type 2 diabetes mellitus with diabetic nephropathy: Secondary | ICD-10-CM | POA: Diagnosis not present

## 2023-09-11 DIAGNOSIS — I10 Essential (primary) hypertension: Secondary | ICD-10-CM | POA: Diagnosis not present

## 2023-09-11 DIAGNOSIS — M810 Age-related osteoporosis without current pathological fracture: Secondary | ICD-10-CM | POA: Diagnosis not present

## 2023-09-11 DIAGNOSIS — Z23 Encounter for immunization: Secondary | ICD-10-CM

## 2023-09-11 DIAGNOSIS — Z7984 Long term (current) use of oral hypoglycemic drugs: Secondary | ICD-10-CM | POA: Diagnosis not present

## 2023-09-11 DIAGNOSIS — E1142 Type 2 diabetes mellitus with diabetic polyneuropathy: Secondary | ICD-10-CM

## 2023-09-11 DIAGNOSIS — M1000 Idiopathic gout, unspecified site: Secondary | ICD-10-CM

## 2023-09-11 DIAGNOSIS — E538 Deficiency of other specified B group vitamins: Secondary | ICD-10-CM | POA: Diagnosis not present

## 2023-09-11 DIAGNOSIS — R634 Abnormal weight loss: Secondary | ICD-10-CM | POA: Diagnosis not present

## 2023-09-11 LAB — CBC WITH DIFFERENTIAL/PLATELET
Basophils Absolute: 0 10*3/uL (ref 0.0–0.1)
Basophils Relative: 0.3 % (ref 0.0–3.0)
Eosinophils Absolute: 0.1 10*3/uL (ref 0.0–0.7)
Eosinophils Relative: 1 % (ref 0.0–5.0)
HCT: 30.5 % — ABNORMAL LOW (ref 36.0–46.0)
Hemoglobin: 9.4 g/dL — ABNORMAL LOW (ref 12.0–15.0)
Lymphocytes Relative: 12.5 % (ref 12.0–46.0)
Lymphs Abs: 1.2 10*3/uL (ref 0.7–4.0)
MCHC: 31 g/dL (ref 30.0–36.0)
MCV: 83.7 fl (ref 78.0–100.0)
Monocytes Absolute: 0.6 10*3/uL (ref 0.1–1.0)
Monocytes Relative: 6.6 % (ref 3.0–12.0)
Neutro Abs: 7.4 10*3/uL (ref 1.4–7.7)
Neutrophils Relative %: 79.6 % — ABNORMAL HIGH (ref 43.0–77.0)
Platelets: 259 10*3/uL (ref 150.0–400.0)
RBC: 3.64 Mil/uL — ABNORMAL LOW (ref 3.87–5.11)
RDW: 16.3 % — ABNORMAL HIGH (ref 11.5–15.5)
WBC: 9.3 10*3/uL (ref 4.0–10.5)

## 2023-09-11 LAB — COMPREHENSIVE METABOLIC PANEL WITH GFR
ALT: 5 U/L (ref 0–35)
AST: 11 U/L (ref 0–37)
Albumin: 3.2 g/dL — ABNORMAL LOW (ref 3.5–5.2)
Alkaline Phosphatase: 81 U/L (ref 39–117)
BUN: 24 mg/dL — ABNORMAL HIGH (ref 6–23)
CO2: 27 meq/L (ref 19–32)
Calcium: 9.4 mg/dL (ref 8.4–10.5)
Chloride: 104 meq/L (ref 96–112)
Creatinine, Ser: 1.09 mg/dL (ref 0.40–1.20)
GFR: 44.87 mL/min — ABNORMAL LOW (ref >60.00)
Glucose, Bld: 99 mg/dL (ref 70–99)
Potassium: 3.8 meq/L (ref 3.5–5.1)
Sodium: 141 meq/L (ref 135–145)
Total Bilirubin: 0.4 mg/dL (ref 0.2–1.2)
Total Protein: 7.7 g/dL (ref 6.0–8.3)

## 2023-09-11 LAB — T4, FREE: Free T4: 0.98 ng/dL (ref 0.60–1.60)

## 2023-09-11 LAB — URIC ACID: Uric Acid, Serum: 3.6 mg/dL (ref 2.4–7.0)

## 2023-09-11 LAB — VITAMIN B12: Vitamin B-12: >1501 pg/mL — ABNORMAL HIGH (ref 211–911)

## 2023-09-11 LAB — TSH: TSH: 0.54 u[IU]/mL (ref 0.35–5.50)

## 2023-09-11 LAB — HEMOGLOBIN A1C: Hgb A1c MFr Bld: 6.8 % — ABNORMAL HIGH (ref 4.6–6.5)

## 2023-09-11 MED ORDER — ALLOPURINOL 100 MG PO TABS: 50.0000 mg | ORAL_TABLET | Freq: Every day | ORAL | 1 refills | Status: DC

## 2023-09-11 NOTE — Assessment & Plan Note (Signed)
Probably normal wt for age. Good appetite. Stable lately

## 2023-09-11 NOTE — Assessment & Plan Note (Signed)
Better - Gabapentin 300 mg tid prn (am, supper and 3 am if needed) Blue-Emu cream  use 2-3 times a day

## 2023-09-11 NOTE — Progress Notes (Signed)
Subjective:  Patient ID: Breanna [name], female    DOB: 08/24/1931  Age: 89 y.o. MRN: [id number]  CC: Follow-up (2 MNTH F/U)   HPI [employer] presents for 2 months f/u - DM, HTN,  C/o wt loss She is here w/[name]  Outpatient Medications Prior to Visit  Medication Sig Dispense Refill   acetaminophen (TYLENOL) 325 MG tablet Take 2 tablets (650 mg total) by mouth every 6 (six) hours as needed for moderate pain or fever.     Alcohol Swabs (TRUE COMFORT ALCOHOL PREP PADS) 70 % PADS Use as directed 300 each 1   amLODipine (NORVASC) 5 MG tablet Take 1 tablet (5 mg total) by mouth daily. 90 tablet 3   ascorbic acid (VITAMIN C) 500 MG tablet TAKE 1 TABLET BY MOUTH EVERY DAY 30 tablet 11   ASPIRIN LOW DOSE 81 MG tablet TAKE 1 TABLET BY MOUTH DAILY. SWALLOW WHOLE. 90 tablet 3   atropine 1 % ophthalmic solution Place 1 drop into both eyes daily.     Blood Glucose Calibration (TRUE METRIX LEVEL 1) Low SOLN USE AS DIRECTED 3 each 3   brimonidine (ALPHAGAN) 0.2 % ophthalmic solution Place 1 drop into the right eye 2 (two) times daily.     calcium carbonate (OSCAL) 1500 (600 Ca) MG TABS tablet TAKE 1 TABLET BY MOUTH EVERY DAY 90 tablet 3   Cholecalciferol (VITAMIN D3) 25 MCG (1000 UT) CAPS Take 1 capsule (1,000 Units total) by mouth daily. 90 capsule 3   [facility] VITAMIN B12 1000 MCG tablet TAKE 1 TABLET (1,000 MCG TOTAL) BY MOUTH EVERY DAY 90 tablet 3   diclofenac sodium (VOLTAREN) 1 % GEL Apply 2 g topically 3 (three) times daily. To left shoulder 200 g 3   docusate sodium (COLACE) 100 MG capsule TAKE 1 CAPSULE BY MOUTH TWICE A DAY 180 capsule 3   dorzolamide-timolol (COSOPT) 22.3-6.8 MG/ML ophthalmic solution Place 1 drop into both eyes 2 (two) times daily.      feeding supplement, ENSURE ENLIVE, (ENSURE ENLIVE) LIQD Take 237 mLs by mouth daily at 2 PM. 237 mL 12   gabapentin (NEURONTIN) 100 MG capsule Take 1 capsule (100 mg total) by mouth 2 (two) times daily. 180 capsule 5   glucose blood (GNP TRUE  METRIX GLUCOSE STRIPS) test strip Use to check blood sugars twice a day 200 each 2   latanoprost (XALATAN) 0.005 % ophthalmic solution Place 1 drop into both eyes at bedtime.      lidocaine (LIDODERM) 5 % Place 1 patch onto the skin daily. Remove & Discard patch within 12 hours or as directed by MD. Apply to left shoulder 30 patch 0   lidocaine-prilocaine (EMLA) cream Apply 1 application topically as needed. 30 g 0   losartan (COZAAR) 100 MG tablet Take 0.5 tablets (50 mg total) by mouth daily.     metFORMIN (GLUCOPHAGE) 500 MG tablet TAKE 1 TABLET BY MOUTH EVERY DAY WITH BREAKFAST 90 tablet 1   mupirocin ointment (BACTROBAN) 2 % On leg wound w/dressing change qd or bid 30 g 0   Netarsudil-Latanoprost 0.02-0.005 % SOLN Apply to eye.     NON FORMULARY Place 10 drops under the tongue daily as needed (Pain). Medication: CBD oil     pantoprazole (PROTONIX) 40 MG tablet TAKE 1 TABLET BY MOUTH EVERY DAY 90 tablet 3   polyethylene glycol (MIRALAX / GLYCOLAX) 17 g packet Take 17 g by mouth daily as needed for mild constipation. 14 each 0  senna (SENOKOT) 8.6 MG TABS tablet Take 1 tablet (8.6 mg total) by mouth 2 (two) times daily. 120 tablet 0   sertraline (ZOLOFT) 100 MG tablet TAKE 1 TABLET BY MOUTH EVERY DAY 90 tablet 1   spironolactone (ALDACTONE) 25 MG tablet TAKE 1 TAB BY MOUTH MON - WED - FRI 90 tablet 1   traMADol (ULTRAM) 50 MG tablet Take 1 tablet (50 mg total) by mouth every 8 (eight) hours as needed for severe pain. 30 tablet 0   TRUEplus Lancets 33G MISC USE AS DIRECTED TO CHECK BLOOD SUGAR TWICE A DAY 200 each 1   valACYclovir (VALTREX) 500 MG tablet Take 1 tablet (500 mg total) by mouth daily as needed (for outbreaks).     allopurinol (ZYLOPRIM) 100 MG tablet TAKE 1 TABLET BY MOUTH EVERY DAY 90 tablet 1   Facility-Administered Medications Prior to Visit  Medication Dose Route Frequency Provider Last Rate Last Admin   regadenoson (LEXISCAN) injection SOLN 0.4 mg  0.4 mg Intravenous Once  Meriam Sprague, MD       technetium tetrofosmin (TC-MYOVIEW) injection 31.1 millicurie  31.1 millicurie Intravenous Once PRN Meriam Sprague, MD        ROS: Review of Systems  Constitutional:  Positive for unexpected weight change. Negative for activity change, appetite change, chills and fatigue.  HENT:  Negative for congestion, mouth sores and sinus pressure.   Eyes:  Positive for visual disturbance.  Respiratory:  Negative for cough, chest tightness and shortness of breath.   Gastrointestinal:  Negative for abdominal pain and nausea.  Genitourinary:  Negative for difficulty urinating, frequency and vaginal pain.  Musculoskeletal:  Negative for back pain and gait problem.  Skin:  Negative for pallor and rash.  Neurological:  Negative for dizziness, tremors, weakness, numbness and headaches.  Psychiatric/Behavioral:  Negative for confusion, sleep disturbance and suicidal ideas. The patient is not nervous/anxious.     Objective:  BP 130/70 (BP Location: Right Arm, Patient Position: Sitting, Cuff Size: Large)   Pulse (!) 59   Temp 97.9 F (36.6 C) (Oral)   Ht 5\' 2"  (1.575 m)   Wt 105 lb (47.6 kg)   SpO2 98%   BMI 19.20 kg/m   BP Readings from Last 3 Encounters:  09/11/23 130/70  08/12/23 116/74  07/11/23 110/78    Wt Readings from Last 3 Encounters:  09/11/23 105 lb (47.6 kg)  08/12/23 105 lb (47.6 kg)  07/11/23 121 lb (54.9 kg)    Physical Exam Constitutional:      General: She is not in acute distress.    Appearance: Normal appearance. She is well-developed.  HENT:     Head: Normocephalic.     Right Ear: External ear normal.     Left Ear: External ear normal.     Nose: Nose normal.  Eyes:     General:        Right eye: No discharge.        Left eye: No discharge.     Conjunctiva/sclera: Conjunctivae normal.     Pupils: Pupils are equal, round, and reactive to light.  Neck:     Thyroid: No thyromegaly.     Vascular: No JVD.     Trachea: No  tracheal deviation.  Cardiovascular:     Rate and Rhythm: Normal rate and regular rhythm.     Heart sounds: Normal heart sounds.  Pulmonary:     Effort: No respiratory distress.     Breath sounds: No stridor. No wheezing.  Abdominal:     General: Bowel sounds are normal. There is no distension.     Palpations: Abdomen is soft. There is no mass.     Tenderness: There is no abdominal tenderness. There is no guarding or rebound.  Musculoskeletal:        General: No tenderness.     Cervical back: Normal range of motion and neck supple. No rigidity.  Lymphadenopathy:     Cervical: No cervical adenopathy.  Skin:    Findings: No erythema or rash.  Neurological:     Mental Status: She is oriented to person, place, and time.     Cranial Nerves: No cranial nerve deficit.     Motor: No abnormal muscle tone.     Coordination: Coordination normal.     Deep Tendon Reflexes: Reflexes normal.  Psychiatric:        Behavior: Behavior normal.        Thought Content: Thought content normal.        Judgment: Judgment normal.   In a w/c blind  Lab Results  Component Value Date   WBC 3.9 (L) 07/11/2023   HGB 11.1 (L) 07/11/2023   HCT 35.6 (L) 07/11/2023   PLT 128.0 (L) 07/11/2023   GLUCOSE 100 (H) 07/11/2023   CHOL 127 03/01/2023   TRIG 42 03/01/2023   HDL 53 03/01/2023   LDLCALC 66 03/01/2023   ALT 8 07/11/2023   AST 18 07/11/2023   NA 140 07/11/2023   K 4.7 07/11/2023   CL 104 07/11/2023   CREATININE 1.26 (H) 07/11/2023   BUN 27 (H) 07/11/2023   CO2 29 07/11/2023   TSH 0.52 07/11/2023   HGBA1C 6.2 02/13/2023    VAS [place] LOWER EXTREMITY VENOUS (DVT)  Result Date: 03/03/2023  Lower Venous DVT Study Patient Name:  LASHONDRIA SILVERWOOD  Date of Exam:   03/01/2023 Medical Rec #: [id number]        Accession #:    [id number] Date of Birth: 1931/11/21         Patient Gender: F Patient Age:   51 years Exam Location:  [facility] Procedure:      VAS [place] LOWER EXTREMITY VENOUS (DVT) Referring  Phys: RIPUDEEP RAI --------------------------------------------------------------------------------  Indications: Edema.  Risk Factors: None identified. Limitations: Poor ultrasound/tissue interface. Comparison Study: No prior studies. Performing Technologist: Chanda Busing RVT  Examination Guidelines: A complete evaluation includes B-mode imaging, spectral Doppler, color Doppler, and power Doppler as needed of all accessible portions of each vessel. Bilateral testing is considered an integral part of a complete examination. Limited examinations for reoccurring indications may be performed as noted. The reflux portion of the exam is performed with the patient in reverse Trendelenburg.  +---------+---------------+---------+-----------+----------+--------------+ RIGHT    CompressibilityPhasicitySpontaneityPropertiesThrombus Aging +---------+---------------+---------+-----------+----------+--------------+ CFV      Full           Yes      Yes                                 +---------+---------------+---------+-----------+----------+--------------+ SFJ      Full                                                        +---------+---------------+---------+-----------+----------+--------------+ FV Prox  Full                                                        +---------+---------------+---------+-----------+----------+--------------+  FV Mid   Full                                                        +---------+---------------+---------+-----------+----------+--------------+ FV DistalFull                                                        +---------+---------------+---------+-----------+----------+--------------+ PFV      Full                                                        +---------+---------------+---------+-----------+----------+--------------+ POP      Full           Yes      Yes                                  +---------+---------------+---------+-----------+----------+--------------+ PTV      Full                                                        +---------+---------------+---------+-----------+----------+--------------+ PERO     Full                                                        +---------+---------------+---------+-----------+----------+--------------+   +---------+---------------+---------+-----------+----------+--------------+ LEFT     CompressibilityPhasicitySpontaneityPropertiesThrombus Aging +---------+---------------+---------+-----------+----------+--------------+ CFV      Full           Yes      Yes                                 +---------+---------------+---------+-----------+----------+--------------+ SFJ      Full                                                        +---------+---------------+---------+-----------+----------+--------------+ FV Prox  Full                                                        +---------+---------------+---------+-----------+----------+--------------+ FV Mid   Full                                                        +---------+---------------+---------+-----------+----------+--------------+  FV DistalFull                                                        +---------+---------------+---------+-----------+----------+--------------+ PFV      Full                                                        +---------+---------------+---------+-----------+----------+--------------+ POP      Full           Yes      Yes                                 +---------+---------------+---------+-----------+----------+--------------+ PTV      Full                                                        +---------+---------------+---------+-----------+----------+--------------+ PERO     Full                                                         +---------+---------------+---------+-----------+----------+--------------+     Summary: RIGHT: - There is no evidence of deep vein thrombosis in the lower extremity.  - No cystic structure found in the popliteal fossa.  LEFT: - There is no evidence of deep vein thrombosis in the lower extremity.  - No cystic structure found in the popliteal fossa.  *See table(s) above for measurements and observations. Electronically signed by Heath Lark on 03/03/2023 at 4:16:14 PM.    Final    CT Angio Chest Pulmonary Embolism (PE) W or WO Contrast  Result Date: 03/01/2023 CLINICAL DATA:  Lungs are intermediate probability for PE. Positive D-dimer. EXAM: CT ANGIOGRAPHY CHEST WITH CONTRAST TECHNIQUE: Multidetector CT imaging of the chest was performed using the standard protocol during bolus administration of intravenous contrast. Multiplanar CT image reconstructions and MIPs were obtained to evaluate the vascular anatomy. RADIATION DOSE REDUCTION: This exam was performed according to the departmental dose-optimization program which includes automated exposure control, adjustment of the mA and/or kV according to patient size and/or use of iterative reconstruction technique. CONTRAST:  75mL OMNIPAQUE IOHEXOL 350 MG/ML SOLN COMPARISON:  Chest CT 08/15/2017 FINDINGS: Cardiovascular: Heart is enlarged. Aortic is ectatic without focal dilatation. There is adequate opacification of the pulmonary arteries to the segmental level. There is no evidence for pulmonary embolism. There are atherosclerotic calcifications of the aorta. Mediastinum/Nodes: No enlarged mediastinal, hilar, or axillary lymph nodes. Esophagus and trachea are within normal limits. The thyroid gland is diffusely heterogeneous. There are calcified mediastinal and right hilar lymph nodes. Lungs/Pleura: Lungs are clear. No pleural effusion or pneumothorax. Upper Abdomen: No acute abnormality. There are calcified granulomas in the liver and spleen. Rounded mildly  hyperdense area in the superior pole of the left kidney is new from  prior measuring 9 mm. Musculoskeletal: The bones are osteopenic. Degenerative changes affect the spine. Review of the MIP images confirms the above findings. IMPRESSION: 1. No evidence for pulmonary embolism or other acute cardiopulmonary process. 2. Cardiomegaly. 3. New 9 mm hyperdense lesion in the superior pole of the left kidney. This may represent a hyperdense cyst, but is indeterminate. This can be further evaluated with ultrasound. Aortic Atherosclerosis (ICD10-I70.0). Electronically Signed   By: Darliss Cheney M.D.   On: 03/01/2023 21:40   ECHOCARDIOGRAM COMPLETE  Result Date: 03/01/2023    ECHOCARDIOGRAM REPORT   Patient Name:   AMYTHEST ORAMA Date of Exam: 03/01/2023 Medical Rec #:  [id number]       Height:       62.0 in Accession #:    [id number]      Weight:       113.8 lb Date of Birth:  01-03-1931        BSA:          1.504 m Patient Age:    89 years        BP:           147/70 mmHg Patient Gender: F               HR:           51 bpm. Exam Location:  Inpatient Procedure: 2D Echo, Cardiac Doppler and Color Doppler Indications:    elevated troponin  History:        Patient has no prior history of Echocardiogram examinations.                 Stroke; Risk Factors:Hypertension and Dyslipidemia.  Sonographer:    Mike Gip Referring Phys: [id number] JUSTIN B HOWERTER IMPRESSIONS  1. Left ventricular ejection fraction, by estimation, is 55 to 60%. The left ventricle has normal function. The left ventricle has no regional wall motion abnormalities. Left ventricular diastolic parameters are consistent with Grade II diastolic dysfunction (pseudonormalization).  2. Right ventricular systolic function is normal. The right ventricular size is normal. There is mildly elevated pulmonary artery systolic pressure. The estimated right ventricular systolic pressure is 39.7 mmHg.  3. Left atrial size was moderately dilated.  4. The mitral valve is  degenerative. Mild to moderate mitral valve regurgitation. No evidence of mitral stenosis.  5. Tricuspid valve regurgitation is moderate.  6. The aortic valve is tricuspid. There is moderate calcification of the aortic valve. Aortic valve regurgitation is trivial. No aortic stenosis is present.  7. The inferior vena cava is dilated in size with >50% respiratory variability, suggesting right atrial pressure of 8 mmHg. FINDINGS  Left Ventricle: Left ventricular ejection fraction, by estimation, is 55 to 60%. The left ventricle has normal function. The left ventricle has no regional wall motion abnormalities. The left ventricular internal cavity size was normal in size. There is  no left ventricular hypertrophy. Left ventricular diastolic parameters are consistent with Grade II diastolic dysfunction (pseudonormalization). Right Ventricle: The right ventricular size is normal. No increase in right ventricular wall thickness. Right ventricular systolic function is normal. There is mildly elevated pulmonary artery systolic pressure. The tricuspid regurgitant velocity is 3.03  m/s, and with an assumed right atrial pressure of 3 mmHg, the estimated right ventricular systolic pressure is 39.7 mmHg. Left Atrium: Left atrial size was moderately dilated. Right Atrium: Right atrial size was normal in size. Pericardium: There is no evidence of pericardial effusion. Mitral Valve: The mitral valve is degenerative in appearance. Mild  to moderate mitral valve regurgitation, with posteriorly-directed jet. No evidence of mitral valve stenosis. Tricuspid Valve: The tricuspid valve is normal in structure. Tricuspid valve regurgitation is moderate . No evidence of tricuspid stenosis. Aortic Valve: The aortic valve is tricuspid. There is moderate calcification of the aortic valve. Aortic valve regurgitation is trivial. Aortic regurgitation PHT measures 763 msec. No aortic stenosis is present. Pulmonic Valve: The pulmonic valve was not well  visualized. Pulmonic valve regurgitation is trivial. No evidence of pulmonic stenosis. Aorta: The aortic root is normal in size and structure. Venous: The inferior vena cava is dilated in size with greater than 50% respiratory variability, suggesting right atrial pressure of 8 mmHg. IAS/Shunts: No atrial level shunt detected by color flow Doppler.  LEFT VENTRICLE PLAX 2D LVIDd:         4.20 cm      Diastology LVIDs:         2.90 cm      LV e' medial:    4.05 cm/s LV PW:         1.30 cm      LV E/e' medial:  18.1 LV IVS:        1.40 cm      LV e' lateral:   5.43 cm/s LVOT diam:     1.90 cm      LV E/e' lateral: 13.5 LV SV:         64 LV SV Index:   42 LVOT Area:     2.84 cm  LV Volumes (MOD) LV vol d, MOD A2C: 128.0 ml LV vol d, MOD A4C: 110.0 ml LV vol s, MOD A2C: 51.8 ml LV vol s, MOD A4C: 38.1 ml LV SV MOD A2C:     76.2 ml LV SV MOD A4C:     110.0 ml LV SV MOD BP:      76.8 ml RIGHT VENTRICLE             IVC RV Basal diam:  3.90 cm     IVC diam: 2.10 cm RV S prime:     11.10 cm/s TAPSE (M-mode): 1.7 cm LEFT ATRIUM             Index        RIGHT ATRIUM           Index LA diam:        3.80 cm 2.53 cm/m   RA Area:     12.70 cm LA Vol (A2C):   62.6 ml 41.62 ml/m  RA Volume:   26.30 ml  17.49 ml/m LA Vol (A4C):   81.0 ml 53.85 ml/m LA Biplane Vol: 75.6 ml 50.26 ml/m  AORTIC VALVE LVOT Vmax:   96.00 cm/s LVOT Vmean:  60.100 cm/s LVOT VTI:    0.225 m AI PHT:      763 msec  AORTA Ao Root diam: 3.00 cm Ao Asc diam:  2.70 cm MITRAL VALVE                  TRICUSPID VALVE MV Area (PHT): 3.03 cm       TR Peak grad:   36.7 mmHg MV Decel Time: 250 msec       TR Vmax:        303.00 cm/s MR Peak grad:    159.3 mmHg MR Mean grad:    99.0 mmHg    SHUNTS MR Vmax:         631.00 cm/s  Systemic VTI:  0.22 m  MR Vmean:        469.0 cm/s   Systemic Diam: 1.90 cm MR PISA:         1.57 cm MR PISA Eff ROA: 8 mm MR PISA Radius:  0.50 cm MV E velocity: 73.20 cm/s MV A velocity: 51.90 cm/s MV E/A ratio:  1.41 Arvilla Meres MD  Electronically signed by Arvilla Meres MD Signature Date/Time: 03/01/2023/9:39:08 AM    Final    DG Shoulder Left  Result Date: 03/01/2023 CLINICAL DATA:  Left shoulder pain.  No known injury EXAM: LEFT SHOULDER - 2 VIEW COMPARISON:  None Available. FINDINGS: There is no evidence of fracture or dislocation. Mild superior subluxation of the humeral head relative to the glenoid. Degenerative changes of the acromioclavicular joint. Soft tissues are unremarkable. IMPRESSION: 1. Mild superior subluxation of the humeral head relative to the glenoid, which can be seen with rotator cuff pathology. Degenerative changes of the acromioclavicular joint. 2.  No acute fracture or dislocation. Electronically Signed   By: Agustin Cree M.D.   On: 03/01/2023 08:17   DG Chest 2 View  Result Date: 02/28/2023 CLINICAL DATA:  Left shoulder pain. EXAM: CHEST - 2 VIEW COMPARISON:  Radiograph 08/24/2022 FINDINGS: Stable heart size allowing for AP technique. Unchanged mediastinal contours. Linear subsegmental opacity in the left upper and lower lobes typical of atelectasis. No pneumothorax or pleural effusion. Normal pulmonary vasculature. Thoracic spondylosis with anterior spurring. There is mild acromioclavicular degenerative change of both shoulders. IMPRESSION: Linear subsegmental atelectasis in the left upper and lower lobes. Electronically Signed   By: Narda Rutherford M.D.   On: 02/28/2023 15:10    Assessment & Plan:   Problem List Items Addressed This Visit     DM2 (diabetes mellitus, type 2) (HCC)    Chronic On Metformin      Relevant Orders   CBC with Differential/Platelet   Comprehensive metabolic panel   Hemoglobin A1c   T4, free   Vitamin B12   TSH   Uric acid   Gout    Will reduce Allopurinol to 50 mg/d due to CRI      Essential hypertension    Spironolactone 3/wk, Losartan 1/2 tab a day - low BP BP is nl now      Weight loss     Probably normal wt for age. Good appetite. Stable lately       Relevant Orders   CBC with Differential/Platelet   Comprehensive metabolic panel   Hemoglobin A1c   T4, free   Vitamin B12   TSH   Uric acid   Diabetic neuropathy (HCC)    Better - Gabapentin 300 mg tid prn (am, supper and 3 am if needed) Blue-Emu cream  use 2-3 times a day      Osteoporosis    On Vit D Falls prevention      Other Visit Diagnoses     Need for influenza vaccination    -  Primary   Relevant Orders   Flu Vaccine Trivalent High Dose (Fluad) (Completed)   B12 deficiency       Relevant Orders   Vitamin B12         Meds ordered this encounter  Medications   allopurinol (ZYLOPRIM) 100 MG tablet    Sig: Take 0.5 tablets (50 mg total) by mouth daily.    Dispense:  90 tablet    Refill:  1      Follow-up: Return in about 3 months (around 12/11/2023).  Sonda Primes, MD

## 2023-09-11 NOTE — Patient Instructions (Signed)
Will reduce Allopurinol to 50 mg/day

## 2023-09-11 NOTE — Assessment & Plan Note (Signed)
Spironolactone 3/wk, Losartan 1/2 tab a day - low BP BP is nl now

## 2023-09-11 NOTE — Assessment & Plan Note (Signed)
Will reduce Allopurinol to 50 mg/d due to CRI

## 2023-09-11 NOTE — Assessment & Plan Note (Signed)
On Vit D Falls prevention

## 2023-09-11 NOTE — Assessment & Plan Note (Signed)
Chronic On Metformin

## 2023-09-12 DIAGNOSIS — R296 Repeated falls: Secondary | ICD-10-CM | POA: Diagnosis not present

## 2023-09-12 DIAGNOSIS — N1832 Chronic kidney disease, stage 3b: Secondary | ICD-10-CM | POA: Diagnosis not present

## 2023-09-12 DIAGNOSIS — H401133 Primary open-angle glaucoma, bilateral, severe stage: Secondary | ICD-10-CM | POA: Diagnosis not present

## 2023-09-12 DIAGNOSIS — M5441 Lumbago with sciatica, right side: Secondary | ICD-10-CM | POA: Diagnosis not present

## 2023-09-12 DIAGNOSIS — G8929 Other chronic pain: Secondary | ICD-10-CM | POA: Diagnosis not present

## 2023-09-12 DIAGNOSIS — E1122 Type 2 diabetes mellitus with diabetic chronic kidney disease: Secondary | ICD-10-CM | POA: Diagnosis not present

## 2023-09-12 DIAGNOSIS — D631 Anemia in chronic kidney disease: Secondary | ICD-10-CM | POA: Diagnosis not present

## 2023-09-12 DIAGNOSIS — I129 Hypertensive chronic kidney disease with stage 1 through stage 4 chronic kidney disease, or unspecified chronic kidney disease: Secondary | ICD-10-CM | POA: Diagnosis not present

## 2023-09-12 DIAGNOSIS — R269 Unspecified abnormalities of gait and mobility: Secondary | ICD-10-CM | POA: Diagnosis not present

## 2023-09-13 DIAGNOSIS — R269 Unspecified abnormalities of gait and mobility: Secondary | ICD-10-CM | POA: Diagnosis not present

## 2023-09-13 DIAGNOSIS — G8929 Other chronic pain: Secondary | ICD-10-CM | POA: Diagnosis not present

## 2023-09-13 DIAGNOSIS — M5441 Lumbago with sciatica, right side: Secondary | ICD-10-CM | POA: Diagnosis not present

## 2023-09-13 DIAGNOSIS — I129 Hypertensive chronic kidney disease with stage 1 through stage 4 chronic kidney disease, or unspecified chronic kidney disease: Secondary | ICD-10-CM | POA: Diagnosis not present

## 2023-09-13 DIAGNOSIS — E1122 Type 2 diabetes mellitus with diabetic chronic kidney disease: Secondary | ICD-10-CM | POA: Diagnosis not present

## 2023-09-13 DIAGNOSIS — D631 Anemia in chronic kidney disease: Secondary | ICD-10-CM | POA: Diagnosis not present

## 2023-09-13 DIAGNOSIS — N1832 Chronic kidney disease, stage 3b: Secondary | ICD-10-CM | POA: Diagnosis not present

## 2023-09-13 DIAGNOSIS — H401133 Primary open-angle glaucoma, bilateral, severe stage: Secondary | ICD-10-CM | POA: Diagnosis not present

## 2023-09-13 DIAGNOSIS — R296 Repeated falls: Secondary | ICD-10-CM | POA: Diagnosis not present

## 2023-09-18 DIAGNOSIS — G8929 Other chronic pain: Secondary | ICD-10-CM | POA: Diagnosis not present

## 2023-09-18 DIAGNOSIS — N1832 Chronic kidney disease, stage 3b: Secondary | ICD-10-CM | POA: Diagnosis not present

## 2023-09-18 DIAGNOSIS — H401133 Primary open-angle glaucoma, bilateral, severe stage: Secondary | ICD-10-CM | POA: Diagnosis not present

## 2023-09-18 DIAGNOSIS — M5441 Lumbago with sciatica, right side: Secondary | ICD-10-CM | POA: Diagnosis not present

## 2023-09-18 DIAGNOSIS — R296 Repeated falls: Secondary | ICD-10-CM | POA: Diagnosis not present

## 2023-09-18 DIAGNOSIS — E1122 Type 2 diabetes mellitus with diabetic chronic kidney disease: Secondary | ICD-10-CM | POA: Diagnosis not present

## 2023-09-18 DIAGNOSIS — D631 Anemia in chronic kidney disease: Secondary | ICD-10-CM | POA: Diagnosis not present

## 2023-09-18 DIAGNOSIS — R269 Unspecified abnormalities of gait and mobility: Secondary | ICD-10-CM | POA: Diagnosis not present

## 2023-09-18 DIAGNOSIS — I129 Hypertensive chronic kidney disease with stage 1 through stage 4 chronic kidney disease, or unspecified chronic kidney disease: Secondary | ICD-10-CM | POA: Diagnosis not present

## 2023-09-24 ENCOUNTER — Telehealth: Payer: Self-pay | Admitting: Internal Medicine

## 2023-09-24 DIAGNOSIS — D631 Anemia in chronic kidney disease: Secondary | ICD-10-CM | POA: Diagnosis not present

## 2023-09-24 DIAGNOSIS — R296 Repeated falls: Secondary | ICD-10-CM | POA: Diagnosis not present

## 2023-09-24 DIAGNOSIS — G8929 Other chronic pain: Secondary | ICD-10-CM | POA: Diagnosis not present

## 2023-09-24 DIAGNOSIS — R269 Unspecified abnormalities of gait and mobility: Secondary | ICD-10-CM | POA: Diagnosis not present

## 2023-09-24 DIAGNOSIS — H401133 Primary open-angle glaucoma, bilateral, severe stage: Secondary | ICD-10-CM | POA: Diagnosis not present

## 2023-09-24 DIAGNOSIS — E1122 Type 2 diabetes mellitus with diabetic chronic kidney disease: Secondary | ICD-10-CM | POA: Diagnosis not present

## 2023-09-24 DIAGNOSIS — M5441 Lumbago with sciatica, right side: Secondary | ICD-10-CM | POA: Diagnosis not present

## 2023-09-24 DIAGNOSIS — N1832 Chronic kidney disease, stage 3b: Secondary | ICD-10-CM | POA: Diagnosis not present

## 2023-09-24 DIAGNOSIS — I129 Hypertensive chronic kidney disease with stage 1 through stage 4 chronic kidney disease, or unspecified chronic kidney disease: Secondary | ICD-10-CM | POA: Diagnosis not present

## 2023-09-24 NOTE — Telephone Encounter (Signed)
Breanna [name] from [facility] called to report a fall. Patient was walking without her walker and fell yesterday. She was standing close to the wall and fell into it and slid into a seated position. Patient has no injuries. Best callback for Breanna [name] is [phone]).

## 2023-09-25 ENCOUNTER — Telehealth: Payer: Self-pay | Admitting: Internal Medicine

## 2023-09-25 NOTE — Telephone Encounter (Signed)
Patient's son called and said that insurance has denied patient her boost.  Is there something that you can do - as far as sending something else or finding a way to code it so it will go through.  Renuka Farfan - son - [phone]

## 2023-09-26 NOTE — Telephone Encounter (Signed)
Noted.  I am sorry.  Office visit with any provider if problems.  Thank you

## 2023-09-27 DIAGNOSIS — G8929 Other chronic pain: Secondary | ICD-10-CM | POA: Diagnosis not present

## 2023-09-27 DIAGNOSIS — R269 Unspecified abnormalities of gait and mobility: Secondary | ICD-10-CM | POA: Diagnosis not present

## 2023-09-27 DIAGNOSIS — D631 Anemia in chronic kidney disease: Secondary | ICD-10-CM | POA: Diagnosis not present

## 2023-09-27 DIAGNOSIS — I129 Hypertensive chronic kidney disease with stage 1 through stage 4 chronic kidney disease, or unspecified chronic kidney disease: Secondary | ICD-10-CM | POA: Diagnosis not present

## 2023-09-27 DIAGNOSIS — R296 Repeated falls: Secondary | ICD-10-CM | POA: Diagnosis not present

## 2023-09-27 DIAGNOSIS — E1122 Type 2 diabetes mellitus with diabetic chronic kidney disease: Secondary | ICD-10-CM | POA: Diagnosis not present

## 2023-09-27 DIAGNOSIS — H401133 Primary open-angle glaucoma, bilateral, severe stage: Secondary | ICD-10-CM | POA: Diagnosis not present

## 2023-09-27 DIAGNOSIS — M5441 Lumbago with sciatica, right side: Secondary | ICD-10-CM | POA: Diagnosis not present

## 2023-09-27 DIAGNOSIS — N1832 Chronic kidney disease, stage 3b: Secondary | ICD-10-CM | POA: Diagnosis not present

## 2023-10-01 DIAGNOSIS — N1832 Chronic kidney disease, stage 3b: Secondary | ICD-10-CM | POA: Diagnosis not present

## 2023-10-01 DIAGNOSIS — R269 Unspecified abnormalities of gait and mobility: Secondary | ICD-10-CM | POA: Diagnosis not present

## 2023-10-01 DIAGNOSIS — M5441 Lumbago with sciatica, right side: Secondary | ICD-10-CM | POA: Diagnosis not present

## 2023-10-01 DIAGNOSIS — E1122 Type 2 diabetes mellitus with diabetic chronic kidney disease: Secondary | ICD-10-CM | POA: Diagnosis not present

## 2023-10-01 DIAGNOSIS — I129 Hypertensive chronic kidney disease with stage 1 through stage 4 chronic kidney disease, or unspecified chronic kidney disease: Secondary | ICD-10-CM | POA: Diagnosis not present

## 2023-10-01 DIAGNOSIS — R296 Repeated falls: Secondary | ICD-10-CM | POA: Diagnosis not present

## 2023-10-01 DIAGNOSIS — H401133 Primary open-angle glaucoma, bilateral, severe stage: Secondary | ICD-10-CM | POA: Diagnosis not present

## 2023-10-01 DIAGNOSIS — G8929 Other chronic pain: Secondary | ICD-10-CM | POA: Diagnosis not present

## 2023-10-01 DIAGNOSIS — D631 Anemia in chronic kidney disease: Secondary | ICD-10-CM | POA: Diagnosis not present

## 2023-10-05 DIAGNOSIS — D631 Anemia in chronic kidney disease: Secondary | ICD-10-CM | POA: Diagnosis not present

## 2023-10-05 DIAGNOSIS — E1140 Type 2 diabetes mellitus with diabetic neuropathy, unspecified: Secondary | ICD-10-CM

## 2023-10-05 DIAGNOSIS — M5441 Lumbago with sciatica, right side: Secondary | ICD-10-CM | POA: Diagnosis not present

## 2023-10-05 DIAGNOSIS — R296 Repeated falls: Secondary | ICD-10-CM | POA: Diagnosis not present

## 2023-10-05 DIAGNOSIS — I700 Atherosclerosis of aorta: Secondary | ICD-10-CM

## 2023-10-05 DIAGNOSIS — R269 Unspecified abnormalities of gait and mobility: Secondary | ICD-10-CM | POA: Diagnosis not present

## 2023-10-05 DIAGNOSIS — H401133 Primary open-angle glaucoma, bilateral, severe stage: Secondary | ICD-10-CM | POA: Diagnosis not present

## 2023-10-05 DIAGNOSIS — N1832 Chronic kidney disease, stage 3b: Secondary | ICD-10-CM | POA: Diagnosis not present

## 2023-10-05 DIAGNOSIS — I129 Hypertensive chronic kidney disease with stage 1 through stage 4 chronic kidney disease, or unspecified chronic kidney disease: Secondary | ICD-10-CM | POA: Diagnosis not present

## 2023-10-05 DIAGNOSIS — M109 Gout, unspecified: Secondary | ICD-10-CM

## 2023-10-05 DIAGNOSIS — E1122 Type 2 diabetes mellitus with diabetic chronic kidney disease: Secondary | ICD-10-CM | POA: Diagnosis not present

## 2023-10-05 DIAGNOSIS — G8929 Other chronic pain: Secondary | ICD-10-CM | POA: Diagnosis not present

## 2023-10-10 DIAGNOSIS — D631 Anemia in chronic kidney disease: Secondary | ICD-10-CM | POA: Diagnosis not present

## 2023-10-10 DIAGNOSIS — R296 Repeated falls: Secondary | ICD-10-CM | POA: Diagnosis not present

## 2023-10-10 DIAGNOSIS — R269 Unspecified abnormalities of gait and mobility: Secondary | ICD-10-CM | POA: Diagnosis not present

## 2023-10-10 DIAGNOSIS — M5441 Lumbago with sciatica, right side: Secondary | ICD-10-CM | POA: Diagnosis not present

## 2023-10-10 DIAGNOSIS — N1832 Chronic kidney disease, stage 3b: Secondary | ICD-10-CM | POA: Diagnosis not present

## 2023-10-10 DIAGNOSIS — E1122 Type 2 diabetes mellitus with diabetic chronic kidney disease: Secondary | ICD-10-CM | POA: Diagnosis not present

## 2023-10-10 DIAGNOSIS — H401133 Primary open-angle glaucoma, bilateral, severe stage: Secondary | ICD-10-CM | POA: Diagnosis not present

## 2023-10-10 DIAGNOSIS — I129 Hypertensive chronic kidney disease with stage 1 through stage 4 chronic kidney disease, or unspecified chronic kidney disease: Secondary | ICD-10-CM | POA: Diagnosis not present

## 2023-10-10 DIAGNOSIS — G8929 Other chronic pain: Secondary | ICD-10-CM | POA: Diagnosis not present

## 2023-10-16 ENCOUNTER — Ambulatory Visit: Payer: Medicare PPO | Admitting: Internal Medicine

## 2023-10-16 ENCOUNTER — Encounter: Payer: Self-pay | Admitting: Internal Medicine

## 2023-10-16 VITALS — BP 130/72 | HR 46 | Temp 98.4°F | Ht 62.0 in | Wt 112.0 lb

## 2023-10-16 DIAGNOSIS — G8929 Other chronic pain: Secondary | ICD-10-CM

## 2023-10-16 DIAGNOSIS — R519 Headache, unspecified: Secondary | ICD-10-CM | POA: Diagnosis not present

## 2023-10-16 DIAGNOSIS — R443 Hallucinations, unspecified: Secondary | ICD-10-CM

## 2023-10-16 DIAGNOSIS — Z7984 Long term (current) use of oral hypoglycemic drugs: Secondary | ICD-10-CM

## 2023-10-16 DIAGNOSIS — H5316 Psychophysical visual disturbances: Secondary | ICD-10-CM

## 2023-10-16 DIAGNOSIS — M5441 Lumbago with sciatica, right side: Secondary | ICD-10-CM

## 2023-10-16 DIAGNOSIS — F439 Reaction to severe stress, unspecified: Secondary | ICD-10-CM

## 2023-10-16 DIAGNOSIS — E1121 Type 2 diabetes mellitus with diabetic nephropathy: Secondary | ICD-10-CM | POA: Diagnosis not present

## 2023-10-16 DIAGNOSIS — H543 Unqualified visual loss, both eyes: Secondary | ICD-10-CM | POA: Diagnosis not present

## 2023-10-16 LAB — CBC WITH DIFFERENTIAL/PLATELET
Basophils Absolute: 0 10*3/uL (ref 0.0–0.1)
Basophils Relative: 0.8 % (ref 0.0–3.0)
Eosinophils Absolute: 0.2 10*3/uL (ref 0.0–0.7)
Eosinophils Relative: 4.2 % (ref 0.0–5.0)
HCT: 32.7 % — ABNORMAL LOW (ref 36.0–46.0)
Hemoglobin: 9.9 g/dL — ABNORMAL LOW (ref 12.0–15.0)
Lymphocytes Relative: 21.8 % (ref 12.0–46.0)
Lymphs Abs: 1.2 10*3/uL (ref 0.7–4.0)
MCHC: 30.1 g/dL (ref 30.0–36.0)
MCV: 86.1 fL (ref 78.0–100.0)
Monocytes Absolute: 0.4 10*3/uL (ref 0.1–1.0)
Monocytes Relative: 7 % (ref 3.0–12.0)
Neutro Abs: 3.7 10*3/uL (ref 1.4–7.7)
Neutrophils Relative %: 66.2 % (ref 43.0–77.0)
Platelets: 184 10*3/uL (ref 150.0–400.0)
RBC: 3.8 Mil/uL — ABNORMAL LOW (ref 3.87–5.11)
RDW: 20.9 % — ABNORMAL HIGH (ref 11.5–15.5)
WBC: 5.6 10*3/uL (ref 4.0–10.5)

## 2023-10-16 LAB — URINALYSIS, ROUTINE W REFLEX MICROSCOPIC
Bilirubin Urine: NEGATIVE
Hgb urine dipstick: NEGATIVE
Ketones, ur: NEGATIVE
Leukocytes,Ua: NEGATIVE
Nitrite: NEGATIVE
Specific Gravity, Urine: 1.02 (ref 1.000–1.030)
Total Protein, Urine: 100 — AB
Urine Glucose: NEGATIVE
Urobilinogen, UA: 0.2 (ref 0.0–1.0)
pH: 6 (ref 5.0–8.0)

## 2023-10-16 LAB — COMPREHENSIVE METABOLIC PANEL
ALT: 9 U/L (ref 0–35)
AST: 18 U/L (ref 0–37)
Albumin: 3.5 g/dL (ref 3.5–5.2)
Alkaline Phosphatase: 74 U/L (ref 39–117)
BUN: 32 mg/dL — ABNORMAL HIGH (ref 6–23)
CO2: 28 meq/L (ref 19–32)
Calcium: 9.6 mg/dL (ref 8.4–10.5)
Chloride: 105 meq/L (ref 96–112)
Creatinine, Ser: 1.3 mg/dL — ABNORMAL HIGH (ref 0.40–1.20)
GFR: 36.3 mL/min — ABNORMAL LOW (ref >60.00)
Glucose, Bld: 133 mg/dL — ABNORMAL HIGH (ref 70–99)
Potassium: 4.6 meq/L (ref 3.5–5.1)
Sodium: 139 meq/L (ref 135–145)
Total Bilirubin: 0.3 mg/dL (ref 0.2–1.2)
Total Protein: 7.7 g/dL (ref 6.0–8.3)

## 2023-10-16 LAB — SEDIMENTATION RATE: Sed Rate: 70 mm/h — ABNORMAL HIGH (ref 0–30)

## 2023-10-16 NOTE — Assessment & Plan Note (Signed)
New. Disccussed w/Aliyana and her sons.  Will continue to monitor symptoms.  The natural course of the syndrome was outlined

## 2023-10-16 NOTE — Progress Notes (Unsigned)
47.6 kg)    Physical Exam Constitutional:      General: She is not in acute distress.    Appearance: Normal appearance. She is well-developed.  HENT:     Head: Normocephalic.     Right Ear: External ear normal.     Left Ear: External ear normal.     Nose: Nose normal.  Eyes:     General:        Right eye: No discharge.        Left eye: No discharge.     Conjunctiva/sclera: Conjunctivae normal.     Pupils: Pupils are equal, round, and reactive to light.  Neck:     Thyroid: No thyromegaly.     Vascular: No JVD.     Trachea: No tracheal deviation.  Cardiovascular:     Rate and Rhythm: Normal rate and regular rhythm.     Heart sounds: Normal heart sounds.  Pulmonary:     Effort: No respiratory distress.     Breath sounds: No stridor. No wheezing.  Abdominal:     General: Bowel sounds are normal. There is no distension.     Palpations: Abdomen is soft. There is no mass.     Tenderness: There is no abdominal tenderness. There is no guarding or rebound.  Musculoskeletal:        General: No tenderness.     Cervical back: Normal range of motion and neck supple. No rigidity.  Lymphadenopathy:     Cervical: No cervical adenopathy.  Skin:    Findings: No erythema or rash.  Neurological:     General: No focal deficit present.     Mental Status: She is oriented to person, place, and time. Mental status is at baseline.     Cranial Nerves: No cranial nerve deficit.     Motor: Weakness present. No abnormal muscle tone.     Coordination: Coordination abnormal.     Gait: Gait  abnormal.     Deep Tendon Reflexes: Reflexes normal.  Psychiatric:        Behavior: Behavior normal.        Thought Content: Thought content normal.        Judgment: Judgment normal.   In a wheelchair  Lab Results  Component Value Date   WBC 5.6 10/16/2023   HGB 9.9 (L) 10/16/2023   HCT 32.7 (L) 10/16/2023   PLT 184.0 10/16/2023   GLUCOSE 133 (H) 10/16/2023   CHOL 127 03/01/2023   TRIG 42 03/01/2023   HDL 53 03/01/2023   LDLCALC 66 03/01/2023   ALT 9 10/16/2023   AST 18 10/16/2023   NA 139 10/16/2023   K 4.6 10/16/2023   CL 105 10/16/2023   CREATININE 1.30 (H) 10/16/2023   BUN 32 (H) 10/16/2023   CO2 28 10/16/2023   TSH 0.54 09/11/2023   HGBA1C 6.8 (H) 09/11/2023    VAS [place] LOWER EXTREMITY VENOUS (DVT)  Result Date: 03/03/2023  Lower Venous DVT Study Patient Name:  Breanna [name]  Date of Exam:   03/01/2023 Medical Rec #: [id number]        Accession #:    [id number] Date of Birth: 1931-04-02         Patient Gender: F Patient Age:   89 years Exam Location:  [facility] Procedure:      VAS [place] LOWER EXTREMITY VENOUS (DVT) Referring Phys: RIPUDEEP RAI --------------------------------------------------------------------------------  Indications: Edema.  Risk Factors: None identified. Limitations: Poor ultrasound/tissue interface. Comparison Study: No prior studies. Performing  Subjective:  Patient ID: Breanna [name], female    DOB: 87-Sep-1932  Age: 89 y.o. MRN: [id number]  CC: Hallucinations (Pt states she has had an episode of Hallucination on 10/15/2023 where she seen 3/4 girls across a counter and they were talking, pt stated she seen her daughter and asked about her hair growth. Pt stated her vision came back to her briefly. Pt states she has a headache at the back of her head as well that started today.//Pt has concern about the size of two veins b/l temple area. Pt also concerned about low BP.)   HPI [facility] presents for visual hallucinations episode x 2 hrs yesterday, resolved. Pt states she has had an episode of Hallucination on 10/15/2023 where she seen 3/4 girls across a counter and they were talking, pt stated she seen her daughter and asked about her hair growth. Pt stated her vision came back to her briefly. Pt states she has a headache at the back of her head as well that started today.//Pt has concern about the size of two veins b/l temple area. Pt also concerned about low BP today.  She is complaining of anxiety, does not want to be a burden to her family. No new meds.  Never drinks alcohol.  She is here today with her 2 sons Amada Jupiter and [name].  Outpatient Medications Prior to Visit  Medication Sig Dispense Refill   acetaminophen (TYLENOL) 325 MG tablet Take 2 tablets (650 mg total) by mouth every 6 (six) hours as needed for moderate pain or fever.     Alcohol Swabs (TRUE COMFORT ALCOHOL PREP PADS) 70 % PADS Use as directed 300 each 1   allopurinol (ZYLOPRIM) 100 MG tablet Take 0.5 tablets (50 mg total) by mouth daily. 90 tablet 1   amLODipine (NORVASC) 5 MG tablet Take 1 tablet (5 mg total) by mouth daily. 90 tablet 3   ascorbic acid (VITAMIN C) 500 MG tablet TAKE 1 TABLET BY MOUTH EVERY DAY 30 tablet 11   ASPIRIN LOW DOSE 81 MG tablet TAKE 1 TABLET BY MOUTH DAILY. SWALLOW WHOLE. 90 tablet 3   atropine 1 % ophthalmic solution Place 1 drop  into both eyes daily.     Blood Glucose Calibration (TRUE METRIX LEVEL 1) Low SOLN USE AS DIRECTED 3 each 3   brimonidine (ALPHAGAN) 0.2 % ophthalmic solution Place 1 drop into the right eye 2 (two) times daily.     calcium carbonate (OSCAL) 1500 (600 Ca) MG TABS tablet TAKE 1 TABLET BY MOUTH EVERY DAY 90 tablet 3   Cholecalciferol (VITAMIN D3) 25 MCG (1000 UT) CAPS Take 1 capsule (1,000 Units total) by mouth daily. 90 capsule 3   [facility] VITAMIN B12 1000 MCG tablet TAKE 1 TABLET (1,000 MCG TOTAL) BY MOUTH EVERY DAY 90 tablet 3   diclofenac sodium (VOLTAREN) 1 % GEL Apply 2 g topically 3 (three) times daily. To left shoulder 200 g 3   docusate sodium (COLACE) 100 MG capsule TAKE 1 CAPSULE BY MOUTH TWICE A DAY 180 capsule 3   dorzolamide-timolol (COSOPT) 22.3-6.8 MG/ML ophthalmic solution Place 1 drop into both eyes 2 (two) times daily.      feeding supplement, ENSURE ENLIVE, (ENSURE ENLIVE) LIQD Take 237 mLs by mouth daily at 2 PM. 237 mL 12   gabapentin (NEURONTIN) 100 MG capsule Take 1 capsule (100 mg total) by mouth 2 (two) times daily. 180 capsule 5   glucose blood (GNP TRUE METRIX GLUCOSE STRIPS) test strip Use  cystic structure found in the popliteal fossa.  *See table(s) above for measurements and observations. Electronically signed by Heath Lark on 03/03/2023 at 4:16:14 PM.    Final    CT Angio Chest Pulmonary Embolism (PE) W or WO Contrast  Result Date: 03/01/2023 CLINICAL DATA:  Lungs are intermediate probability for PE. Positive D-dimer. EXAM: CT ANGIOGRAPHY CHEST WITH CONTRAST TECHNIQUE: Multidetector CT imaging of the chest was performed using the standard protocol during bolus administration of intravenous contrast. Multiplanar CT image reconstructions and MIPs were  obtained to evaluate the vascular anatomy. RADIATION DOSE REDUCTION: This exam was performed according to the departmental dose-optimization program which includes automated exposure control, adjustment of the mA and/or kV according to patient size and/or use of iterative reconstruction technique. CONTRAST:  75mL OMNIPAQUE IOHEXOL 350 MG/ML SOLN COMPARISON:  Chest CT 08/15/2017 FINDINGS: Cardiovascular: Heart is enlarged. Aortic is ectatic without focal dilatation. There is adequate opacification of the pulmonary arteries to the segmental level. There is no evidence for pulmonary embolism. There are atherosclerotic calcifications of the aorta. Mediastinum/Nodes: No enlarged mediastinal, hilar, or axillary lymph nodes. Esophagus and trachea are within normal limits. The thyroid gland is diffusely heterogeneous. There are calcified mediastinal and right hilar lymph nodes. Lungs/Pleura: Lungs are clear. No pleural effusion or pneumothorax. Upper Abdomen: No acute abnormality. There are calcified granulomas in the liver and spleen. Rounded mildly hyperdense area in the superior pole of the left kidney is new from prior measuring 9 mm. Musculoskeletal: The bones are osteopenic. Degenerative changes affect the spine. Review of the MIP images confirms the above findings. IMPRESSION: 1. No evidence for pulmonary embolism or other acute cardiopulmonary process. 2. Cardiomegaly. 3. New 9 mm hyperdense lesion in the superior pole of the left kidney. This may represent a hyperdense cyst, but is indeterminate. This can be further evaluated with ultrasound. Aortic Atherosclerosis (ICD10-I70.0). Electronically Signed   By: Darliss Cheney M.D.   On: 03/01/2023 21:40   ECHOCARDIOGRAM COMPLETE  Result Date: 03/01/2023    ECHOCARDIOGRAM REPORT   Patient Name:   Breanna [name] Date of Exam: 03/01/2023 Medical Rec #:  [id number]       Height:       62.0 in Accession #:    [id number]      Weight:       113.8 lb Date of Birth:  03-27-1931         BSA:          1.504 m Patient Age:    89 years        BP:           147/70 mmHg Patient Gender: F               HR:           51 bpm. Exam Location:  Inpatient Procedure: 2D Echo, Cardiac Doppler and Color Doppler Indications:    elevated troponin  History:        Patient has no prior history of Echocardiogram examinations.                 Stroke; Risk Factors:Hypertension and Dyslipidemia.  Sonographer:    Mike Gip Referring Phys: [id number] JUSTIN B HOWERTER IMPRESSIONS  1. Left ventricular ejection fraction, by estimation, is 55 to 60%. The left ventricle has normal function. The left ventricle has no regional wall motion abnormalities. Left ventricular diastolic parameters are consistent with Grade II diastolic dysfunction (pseudonormalization).  2. Right ventricular systolic function is normal. The right  Subjective:  Patient ID: Breanna [name], female    DOB: 87-Sep-1932  Age: 89 y.o. MRN: [id number]  CC: Hallucinations (Pt states she has had an episode of Hallucination on 10/15/2023 where she seen 3/4 girls across a counter and they were talking, pt stated she seen her daughter and asked about her hair growth. Pt stated her vision came back to her briefly. Pt states she has a headache at the back of her head as well that started today.//Pt has concern about the size of two veins b/l temple area. Pt also concerned about low BP.)   HPI [facility] presents for visual hallucinations episode x 2 hrs yesterday, resolved. Pt states she has had an episode of Hallucination on 10/15/2023 where she seen 3/4 girls across a counter and they were talking, pt stated she seen her daughter and asked about her hair growth. Pt stated her vision came back to her briefly. Pt states she has a headache at the back of her head as well that started today.//Pt has concern about the size of two veins b/l temple area. Pt also concerned about low BP today.  She is complaining of anxiety, does not want to be a burden to her family. No new meds.  Never drinks alcohol.  She is here today with her 2 sons Amada Jupiter and [name].  Outpatient Medications Prior to Visit  Medication Sig Dispense Refill   acetaminophen (TYLENOL) 325 MG tablet Take 2 tablets (650 mg total) by mouth every 6 (six) hours as needed for moderate pain or fever.     Alcohol Swabs (TRUE COMFORT ALCOHOL PREP PADS) 70 % PADS Use as directed 300 each 1   allopurinol (ZYLOPRIM) 100 MG tablet Take 0.5 tablets (50 mg total) by mouth daily. 90 tablet 1   amLODipine (NORVASC) 5 MG tablet Take 1 tablet (5 mg total) by mouth daily. 90 tablet 3   ascorbic acid (VITAMIN C) 500 MG tablet TAKE 1 TABLET BY MOUTH EVERY DAY 30 tablet 11   ASPIRIN LOW DOSE 81 MG tablet TAKE 1 TABLET BY MOUTH DAILY. SWALLOW WHOLE. 90 tablet 3   atropine 1 % ophthalmic solution Place 1 drop  into both eyes daily.     Blood Glucose Calibration (TRUE METRIX LEVEL 1) Low SOLN USE AS DIRECTED 3 each 3   brimonidine (ALPHAGAN) 0.2 % ophthalmic solution Place 1 drop into the right eye 2 (two) times daily.     calcium carbonate (OSCAL) 1500 (600 Ca) MG TABS tablet TAKE 1 TABLET BY MOUTH EVERY DAY 90 tablet 3   Cholecalciferol (VITAMIN D3) 25 MCG (1000 UT) CAPS Take 1 capsule (1,000 Units total) by mouth daily. 90 capsule 3   [facility]S VITAMIN B12 1000 MCG tablet TAKE 1 TABLET (1,000 MCG TOTAL) BY MOUTH EVERY DAY 90 tablet 3   diclofenac sodium (VOLTAREN) 1 % GEL Apply 2 g topically 3 (three) times daily. To left shoulder 200 g 3   docusate sodium (COLACE) 100 MG capsule TAKE 1 CAPSULE BY MOUTH TWICE A DAY 180 capsule 3   dorzolamide-timolol (COSOPT) 22.3-6.8 MG/ML ophthalmic solution Place 1 drop into both eyes 2 (two) times daily.      feeding supplement, ENSURE ENLIVE, (ENSURE ENLIVE) LIQD Take 237 mLs by mouth daily at 2 PM. 237 mL 12   gabapentin (NEURONTIN) 100 MG capsule Take 1 capsule (100 mg total) by mouth 2 (two) times daily. 180 capsule 5   glucose blood (GNP TRUE METRIX GLUCOSE STRIPS) test strip Use  Subjective:  Patient ID: Breanna [name], female    DOB: 87-Sep-1932  Age: 89 y.o. MRN: [id number]  CC: Hallucinations (Pt states she has had an episode of Hallucination on 10/15/2023 where she seen 3/4 girls across a counter and they were talking, pt stated she seen her daughter and asked about her hair growth. Pt stated her vision came back to her briefly. Pt states she has a headache at the back of her head as well that started today.//Pt has concern about the size of two veins b/l temple area. Pt also concerned about low BP.)   HPI [facility] presents for visual hallucinations episode x 2 hrs yesterday, resolved. Pt states she has had an episode of Hallucination on 10/15/2023 where she seen 3/4 girls across a counter and they were talking, pt stated she seen her daughter and asked about her hair growth. Pt stated her vision came back to her briefly. Pt states she has a headache at the back of her head as well that started today.//Pt has concern about the size of two veins b/l temple area. Pt also concerned about low BP today.  She is complaining of anxiety, does not want to be a burden to her family. No new meds.  Never drinks alcohol.  She is here today with her 2 sons Amada Jupiter and [name].  Outpatient Medications Prior to Visit  Medication Sig Dispense Refill   acetaminophen (TYLENOL) 325 MG tablet Take 2 tablets (650 mg total) by mouth every 6 (six) hours as needed for moderate pain or fever.     Alcohol Swabs (TRUE COMFORT ALCOHOL PREP PADS) 70 % PADS Use as directed 300 each 1   allopurinol (ZYLOPRIM) 100 MG tablet Take 0.5 tablets (50 mg total) by mouth daily. 90 tablet 1   amLODipine (NORVASC) 5 MG tablet Take 1 tablet (5 mg total) by mouth daily. 90 tablet 3   ascorbic acid (VITAMIN C) 500 MG tablet TAKE 1 TABLET BY MOUTH EVERY DAY 30 tablet 11   ASPIRIN LOW DOSE 81 MG tablet TAKE 1 TABLET BY MOUTH DAILY. SWALLOW WHOLE. 90 tablet 3   atropine 1 % ophthalmic solution Place 1 drop  into both eyes daily.     Blood Glucose Calibration (TRUE METRIX LEVEL 1) Low SOLN USE AS DIRECTED 3 each 3   brimonidine (ALPHAGAN) 0.2 % ophthalmic solution Place 1 drop into the right eye 2 (two) times daily.     calcium carbonate (OSCAL) 1500 (600 Ca) MG TABS tablet TAKE 1 TABLET BY MOUTH EVERY DAY 90 tablet 3   Cholecalciferol (VITAMIN D3) 25 MCG (1000 UT) CAPS Take 1 capsule (1,000 Units total) by mouth daily. 90 capsule 3   CVS VITAMIN B12 1000 MCG tablet TAKE 1 TABLET (1,000 MCG TOTAL) BY MOUTH EVERY DAY 90 tablet 3   diclofenac sodium (VOLTAREN) 1 % GEL Apply 2 g topically 3 (three) times daily. To left shoulder 200 g 3   docusate sodium (COLACE) 100 MG capsule TAKE 1 CAPSULE BY MOUTH TWICE A DAY 180 capsule 3   dorzolamide-timolol (COSOPT) 22.3-6.8 MG/ML ophthalmic solution Place 1 drop into both eyes 2 (two) times daily.      feeding supplement, ENSURE ENLIVE, (ENSURE ENLIVE) LIQD Take 237 mLs by mouth daily at 2 PM. 237 mL 12   gabapentin (NEURONTIN) 100 MG capsule Take 1 capsule (100 mg total) by mouth 2 (two) times daily. 180 capsule 5   glucose blood (GNP TRUE METRIX GLUCOSE STRIPS) test strip Use  47.6 kg)    Physical Exam Constitutional:      General: She is not in acute distress.    Appearance: Normal appearance. She is well-developed.  HENT:     Head: Normocephalic.     Right Ear: External ear normal.     Left Ear: External ear normal.     Nose: Nose normal.  Eyes:     General:        Right eye: No discharge.        Left eye: No discharge.     Conjunctiva/sclera: Conjunctivae normal.     Pupils: Pupils are equal, round, and reactive to light.  Neck:     Thyroid: No thyromegaly.     Vascular: No JVD.     Trachea: No tracheal deviation.  Cardiovascular:     Rate and Rhythm: Normal rate and regular rhythm.     Heart sounds: Normal heart sounds.  Pulmonary:     Effort: No respiratory distress.     Breath sounds: No stridor. No wheezing.  Abdominal:     General: Bowel sounds are normal. There is no distension.     Palpations: Abdomen is soft. There is no mass.     Tenderness: There is no abdominal tenderness. There is no guarding or rebound.  Musculoskeletal:        General: No tenderness.     Cervical back: Normal range of motion and neck supple. No rigidity.  Lymphadenopathy:     Cervical: No cervical adenopathy.  Skin:    Findings: No erythema or rash.  Neurological:     General: No focal deficit present.     Mental Status: She is oriented to person, place, and time. Mental status is at baseline.     Cranial Nerves: No cranial nerve deficit.     Motor: Weakness present. No abnormal muscle tone.     Coordination: Coordination abnormal.     Gait: Gait  abnormal.     Deep Tendon Reflexes: Reflexes normal.  Psychiatric:        Behavior: Behavior normal.        Thought Content: Thought content normal.        Judgment: Judgment normal.   In a wheelchair  Lab Results  Component Value Date   WBC 5.6 10/16/2023   HGB 9.9 (L) 10/16/2023   HCT 32.7 (L) 10/16/2023   PLT 184.0 10/16/2023   GLUCOSE 133 (H) 10/16/2023   CHOL 127 03/01/2023   TRIG 42 03/01/2023   HDL 53 03/01/2023   LDLCALC 66 03/01/2023   ALT 9 10/16/2023   AST 18 10/16/2023   NA 139 10/16/2023   K 4.6 10/16/2023   CL 105 10/16/2023   CREATININE 1.30 (H) 10/16/2023   BUN 32 (H) 10/16/2023   CO2 28 10/16/2023   TSH 0.54 09/11/2023   HGBA1C 6.8 (H) 09/11/2023    VAS [place] LOWER EXTREMITY VENOUS (DVT)  Result Date: 03/03/2023  Lower Venous DVT Study Patient Name:  Breanna [name]  Date of Exam:   03/01/2023 Medical Rec #: [id number]        Accession #:    [id number] Date of Birth: 1931-04-02         Patient Gender: F Patient Age:   89 years Exam Location:  [facility] Procedure:      VAS [place] LOWER EXTREMITY VENOUS (DVT) Referring Phys: RIPUDEEP RAI --------------------------------------------------------------------------------  Indications: Edema.  Risk Factors: None identified. Limitations: Poor ultrasound/tissue interface. Comparison Study: No prior studies. Performing  47.6 kg)    Physical Exam Constitutional:      General: She is not in acute distress.    Appearance: Normal appearance. She is well-developed.  HENT:     Head: Normocephalic.     Right Ear: External ear normal.     Left Ear: External ear normal.     Nose: Nose normal.  Eyes:     General:        Right eye: No discharge.        Left eye: No discharge.     Conjunctiva/sclera: Conjunctivae normal.     Pupils: Pupils are equal, round, and reactive to light.  Neck:     Thyroid: No thyromegaly.     Vascular: No JVD.     Trachea: No tracheal deviation.  Cardiovascular:     Rate and Rhythm: Normal rate and regular rhythm.     Heart sounds: Normal heart sounds.  Pulmonary:     Effort: No respiratory distress.     Breath sounds: No stridor. No wheezing.  Abdominal:     General: Bowel sounds are normal. There is no distension.     Palpations: Abdomen is soft. There is no mass.     Tenderness: There is no abdominal tenderness. There is no guarding or rebound.  Musculoskeletal:        General: No tenderness.     Cervical back: Normal range of motion and neck supple. No rigidity.  Lymphadenopathy:     Cervical: No cervical adenopathy.  Skin:    Findings: No erythema or rash.  Neurological:     General: No focal deficit present.     Mental Status: She is oriented to person, place, and time. Mental status is at baseline.     Cranial Nerves: No cranial nerve deficit.     Motor: Weakness present. No abnormal muscle tone.     Coordination: Coordination abnormal.     Gait: Gait  abnormal.     Deep Tendon Reflexes: Reflexes normal.  Psychiatric:        Behavior: Behavior normal.        Thought Content: Thought content normal.        Judgment: Judgment normal.   In a wheelchair  Lab Results  Component Value Date   WBC 5.6 10/16/2023   HGB 9.9 (L) 10/16/2023   HCT 32.7 (L) 10/16/2023   PLT 184.0 10/16/2023   GLUCOSE 133 (H) 10/16/2023   CHOL 127 03/01/2023   TRIG 42 03/01/2023   HDL 53 03/01/2023   LDLCALC 66 03/01/2023   ALT 9 10/16/2023   AST 18 10/16/2023   NA 139 10/16/2023   K 4.6 10/16/2023   CL 105 10/16/2023   CREATININE 1.30 (H) 10/16/2023   BUN 32 (H) 10/16/2023   CO2 28 10/16/2023   TSH 0.54 09/11/2023   HGBA1C 6.8 (H) 09/11/2023    VAS [place] LOWER EXTREMITY VENOUS (DVT)  Result Date: 03/03/2023  Lower Venous DVT Study Patient Name:  Breanna [name]  Date of Exam:   03/01/2023 Medical Rec #: [id number]        Accession #:    [id number] Date of Birth: 1931-04-02         Patient Gender: F Patient Age:   89 years Exam Location:  [facility] Procedure:      VAS [place] LOWER EXTREMITY VENOUS (DVT) Referring Phys: RIPUDEEP RAI --------------------------------------------------------------------------------  Indications: Edema.  Risk Factors: None identified. Limitations: Poor ultrasound/tissue interface. Comparison Study: No prior studies. Performing  Subjective:  Patient ID: Breanna [name], female    DOB: 87-Sep-1932  Age: 89 y.o. MRN: [id number]  CC: Hallucinations (Pt states she has had an episode of Hallucination on 10/15/2023 where she seen 3/4 girls across a counter and they were talking, pt stated she seen her daughter and asked about her hair growth. Pt stated her vision came back to her briefly. Pt states she has a headache at the back of her head as well that started today.//Pt has concern about the size of two veins b/l temple area. Pt also concerned about low BP.)   HPI [facility] presents for visual hallucinations episode x 2 hrs yesterday, resolved. Pt states she has had an episode of Hallucination on 10/15/2023 where she seen 3/4 girls across a counter and they were talking, pt stated she seen her daughter and asked about her hair growth. Pt stated her vision came back to her briefly. Pt states she has a headache at the back of her head as well that started today.//Pt has concern about the size of two veins b/l temple area. Pt also concerned about low BP today.  She is complaining of anxiety, does not want to be a burden to her family. No new meds.  Never drinks alcohol.  She is here today with her 2 sons Amada Jupiter and [name].  Outpatient Medications Prior to Visit  Medication Sig Dispense Refill   acetaminophen (TYLENOL) 325 MG tablet Take 2 tablets (650 mg total) by mouth every 6 (six) hours as needed for moderate pain or fever.     Alcohol Swabs (TRUE COMFORT ALCOHOL PREP PADS) 70 % PADS Use as directed 300 each 1   allopurinol (ZYLOPRIM) 100 MG tablet Take 0.5 tablets (50 mg total) by mouth daily. 90 tablet 1   amLODipine (NORVASC) 5 MG tablet Take 1 tablet (5 mg total) by mouth daily. 90 tablet 3   ascorbic acid (VITAMIN C) 500 MG tablet TAKE 1 TABLET BY MOUTH EVERY DAY 30 tablet 11   ASPIRIN LOW DOSE 81 MG tablet TAKE 1 TABLET BY MOUTH DAILY. SWALLOW WHOLE. 90 tablet 3   atropine 1 % ophthalmic solution Place 1 drop  into both eyes daily.     Blood Glucose Calibration (TRUE METRIX LEVEL 1) Low SOLN USE AS DIRECTED 3 each 3   brimonidine (ALPHAGAN) 0.2 % ophthalmic solution Place 1 drop into the right eye 2 (two) times daily.     calcium carbonate (OSCAL) 1500 (600 Ca) MG TABS tablet TAKE 1 TABLET BY MOUTH EVERY DAY 90 tablet 3   Cholecalciferol (VITAMIN D3) 25 MCG (1000 UT) CAPS Take 1 capsule (1,000 Units total) by mouth daily. 90 capsule 3   [facility]S VITAMIN B12 1000 MCG tablet TAKE 1 TABLET (1,000 MCG TOTAL) BY MOUTH EVERY DAY 90 tablet 3   diclofenac sodium (VOLTAREN) 1 % GEL Apply 2 g topically 3 (three) times daily. To left shoulder 200 g 3   docusate sodium (COLACE) 100 MG capsule TAKE 1 CAPSULE BY MOUTH TWICE A DAY 180 capsule 3   dorzolamide-timolol (COSOPT) 22.3-6.8 MG/ML ophthalmic solution Place 1 drop into both eyes 2 (two) times daily.      feeding supplement, ENSURE ENLIVE, (ENSURE ENLIVE) LIQD Take 237 mLs by mouth daily at 2 PM. 237 mL 12   gabapentin (NEURONTIN) 100 MG capsule Take 1 capsule (100 mg total) by mouth 2 (two) times daily. 180 capsule 5   glucose blood (GNP TRUE METRIX GLUCOSE STRIPS) test strip Use  cystic structure found in the popliteal fossa.  *See table(s) above for measurements and observations. Electronically signed by Heath Lark on 03/03/2023 at 4:16:14 PM.    Final    CT Angio Chest Pulmonary Embolism (PE) W or WO Contrast  Result Date: 03/01/2023 CLINICAL DATA:  Lungs are intermediate probability for PE. Positive D-dimer. EXAM: CT ANGIOGRAPHY CHEST WITH CONTRAST TECHNIQUE: Multidetector CT imaging of the chest was performed using the standard protocol during bolus administration of intravenous contrast. Multiplanar CT image reconstructions and MIPs were  obtained to evaluate the vascular anatomy. RADIATION DOSE REDUCTION: This exam was performed according to the departmental dose-optimization program which includes automated exposure control, adjustment of the mA and/or kV according to patient size and/or use of iterative reconstruction technique. CONTRAST:  75mL OMNIPAQUE IOHEXOL 350 MG/ML SOLN COMPARISON:  Chest CT 08/15/2017 FINDINGS: Cardiovascular: Heart is enlarged. Aortic is ectatic without focal dilatation. There is adequate opacification of the pulmonary arteries to the segmental level. There is no evidence for pulmonary embolism. There are atherosclerotic calcifications of the aorta. Mediastinum/Nodes: No enlarged mediastinal, hilar, or axillary lymph nodes. Esophagus and trachea are within normal limits. The thyroid gland is diffusely heterogeneous. There are calcified mediastinal and right hilar lymph nodes. Lungs/Pleura: Lungs are clear. No pleural effusion or pneumothorax. Upper Abdomen: No acute abnormality. There are calcified granulomas in the liver and spleen. Rounded mildly hyperdense area in the superior pole of the left kidney is new from prior measuring 9 mm. Musculoskeletal: The bones are osteopenic. Degenerative changes affect the spine. Review of the MIP images confirms the above findings. IMPRESSION: 1. No evidence for pulmonary embolism or other acute cardiopulmonary process. 2. Cardiomegaly. 3. New 9 mm hyperdense lesion in the superior pole of the left kidney. This may represent a hyperdense cyst, but is indeterminate. This can be further evaluated with ultrasound. Aortic Atherosclerosis (ICD10-I70.0). Electronically Signed   By: Darliss Cheney M.D.   On: 03/01/2023 21:40   ECHOCARDIOGRAM COMPLETE  Result Date: 03/01/2023    ECHOCARDIOGRAM REPORT   Patient Name:   Breanna [name] Date of Exam: 03/01/2023 Medical Rec #:  [id number]       Height:       62.0 in Accession #:    [id number]      Weight:       113.8 lb Date of Birth:  03-27-1931         BSA:          1.504 m Patient Age:    89 years        BP:           147/70 mmHg Patient Gender: F               HR:           51 bpm. Exam Location:  Inpatient Procedure: 2D Echo, Cardiac Doppler and Color Doppler Indications:    elevated troponin  History:        Patient has no prior history of Echocardiogram examinations.                 Stroke; Risk Factors:Hypertension and Dyslipidemia.  Sonographer:    Mike Gip Referring Phys: [id number] JUSTIN B HOWERTER IMPRESSIONS  1. Left ventricular ejection fraction, by estimation, is 55 to 60%. The left ventricle has normal function. The left ventricle has no regional wall motion abnormalities. Left ventricular diastolic parameters are consistent with Grade II diastolic dysfunction (pseudonormalization).  2. Right ventricular systolic function is normal. The right

## 2023-10-16 NOTE — Progress Notes (Incomplete)
09/11/23 105 lb (47.6 kg)  08/12/23 105 lb (47.6 kg)    Physical Exam  Lab Results  Component Value Date   WBC 9.3 09/11/2023   HGB 9.4 (L) 09/11/2023   HCT 30.5 (L) 09/11/2023   PLT 259.0 09/11/2023   GLUCOSE 99 09/11/2023   CHOL 127 03/01/2023   TRIG 42 03/01/2023   HDL 53 03/01/2023   LDLCALC 66 03/01/2023   ALT 5 09/11/2023   AST 11 09/11/2023   NA 141 09/11/2023   K 3.8 09/11/2023   CL 104 09/11/2023   CREATININE 1.09 09/11/2023   BUN 24 (H) 09/11/2023   CO2 27 09/11/2023   TSH 0.54 09/11/2023   HGBA1C 6.8 (H) 09/11/2023    VAS [place] LOWER EXTREMITY VENOUS (DVT)  Result Date: 03/03/2023  Lower Venous DVT Study Patient Name:  Breanna [name]  Date of Exam:   03/01/2023 Medical Rec #: [id number]        Accession #:    [id number] Date of Birth: Jun 09, 1931         Patient Gender: F Patient Age:   89 years Exam Location:  [facility] Procedure:      VAS [place] LOWER EXTREMITY VENOUS (DVT) Referring Phys: RIPUDEEP RAI --------------------------------------------------------------------------------  Indications: Edema.  Risk Factors: None identified. Limitations: Poor ultrasound/tissue interface. Comparison Study: No prior studies. Performing Technologist: Chanda Busing RVT  Examination Guidelines: A complete evaluation includes B-mode imaging, spectral Doppler, color Doppler, and power Doppler as needed of all accessible portions of each vessel. Bilateral testing is considered an integral part of a complete  examination. Limited examinations for reoccurring indications may be performed as noted. The reflux portion of the exam is performed with the patient in reverse Trendelenburg.  +---------+---------------+---------+-----------+----------+--------------+ RIGHT    CompressibilityPhasicitySpontaneityPropertiesThrombus Aging +---------+---------------+---------+-----------+----------+--------------+ CFV      Full           Yes      Yes                                 +---------+---------------+---------+-----------+----------+--------------+ SFJ      Full                                                        +---------+---------------+---------+-----------+----------+--------------+ FV Prox  Full                                                        +---------+---------------+---------+-----------+----------+--------------+ FV Mid   Full                                                        +---------+---------------+---------+-----------+----------+--------------+ FV DistalFull                                                        +---------+---------------+---------+-----------+----------+--------------+  09/11/23 105 lb (47.6 kg)  08/12/23 105 lb (47.6 kg)    Physical Exam  Lab Results  Component Value Date   WBC 9.3 09/11/2023   HGB 9.4 (L) 09/11/2023   HCT 30.5 (L) 09/11/2023   PLT 259.0 09/11/2023   GLUCOSE 99 09/11/2023   CHOL 127 03/01/2023   TRIG 42 03/01/2023   HDL 53 03/01/2023   LDLCALC 66 03/01/2023   ALT 5 09/11/2023   AST 11 09/11/2023   NA 141 09/11/2023   K 3.8 09/11/2023   CL 104 09/11/2023   CREATININE 1.09 09/11/2023   BUN 24 (H) 09/11/2023   CO2 27 09/11/2023   TSH 0.54 09/11/2023   HGBA1C 6.8 (H) 09/11/2023    VAS [place] LOWER EXTREMITY VENOUS (DVT)  Result Date: 03/03/2023  Lower Venous DVT Study Patient Name:  Breanna [name]  Date of Exam:   03/01/2023 Medical Rec #: [id number]        Accession #:    [id number] Date of Birth: Jun 09, 1931         Patient Gender: F Patient Age:   89 years Exam Location:  [facility] Procedure:      VAS [place] LOWER EXTREMITY VENOUS (DVT) Referring Phys: RIPUDEEP RAI --------------------------------------------------------------------------------  Indications: Edema.  Risk Factors: None identified. Limitations: Poor ultrasound/tissue interface. Comparison Study: No prior studies. Performing Technologist: Chanda Busing RVT  Examination Guidelines: A complete evaluation includes B-mode imaging, spectral Doppler, color Doppler, and power Doppler as needed of all accessible portions of each vessel. Bilateral testing is considered an integral part of a complete  examination. Limited examinations for reoccurring indications may be performed as noted. The reflux portion of the exam is performed with the patient in reverse Trendelenburg.  +---------+---------------+---------+-----------+----------+--------------+ RIGHT    CompressibilityPhasicitySpontaneityPropertiesThrombus Aging +---------+---------------+---------+-----------+----------+--------------+ CFV      Full           Yes      Yes                                 +---------+---------------+---------+-----------+----------+--------------+ SFJ      Full                                                        +---------+---------------+---------+-----------+----------+--------------+ FV Prox  Full                                                        +---------+---------------+---------+-----------+----------+--------------+ FV Mid   Full                                                        +---------+---------------+---------+-----------+----------+--------------+ FV DistalFull                                                        +---------+---------------+---------+-----------+----------+--------------+  09/11/23 105 lb (47.6 kg)  08/12/23 105 lb (47.6 kg)    Physical Exam  Lab Results  Component Value Date   WBC 9.3 09/11/2023   HGB 9.4 (L) 09/11/2023   HCT 30.5 (L) 09/11/2023   PLT 259.0 09/11/2023   GLUCOSE 99 09/11/2023   CHOL 127 03/01/2023   TRIG 42 03/01/2023   HDL 53 03/01/2023   LDLCALC 66 03/01/2023   ALT 5 09/11/2023   AST 11 09/11/2023   NA 141 09/11/2023   K 3.8 09/11/2023   CL 104 09/11/2023   CREATININE 1.09 09/11/2023   BUN 24 (H) 09/11/2023   CO2 27 09/11/2023   TSH 0.54 09/11/2023   HGBA1C 6.8 (H) 09/11/2023    VAS [place] LOWER EXTREMITY VENOUS (DVT)  Result Date: 03/03/2023  Lower Venous DVT Study Patient Name:  Breanna [name]  Date of Exam:   03/01/2023 Medical Rec #: [id number]        Accession #:    [id number] Date of Birth: Jun 09, 1931         Patient Gender: F Patient Age:   89 years Exam Location:  [facility] Procedure:      VAS [place] LOWER EXTREMITY VENOUS (DVT) Referring Phys: RIPUDEEP RAI --------------------------------------------------------------------------------  Indications: Edema.  Risk Factors: None identified. Limitations: Poor ultrasound/tissue interface. Comparison Study: No prior studies. Performing Technologist: Chanda Busing RVT  Examination Guidelines: A complete evaluation includes B-mode imaging, spectral Doppler, color Doppler, and power Doppler as needed of all accessible portions of each vessel. Bilateral testing is considered an integral part of a complete  examination. Limited examinations for reoccurring indications may be performed as noted. The reflux portion of the exam is performed with the patient in reverse Trendelenburg.  +---------+---------------+---------+-----------+----------+--------------+ RIGHT    CompressibilityPhasicitySpontaneityPropertiesThrombus Aging +---------+---------------+---------+-----------+----------+--------------+ CFV      Full           Yes      Yes                                 +---------+---------------+---------+-----------+----------+--------------+ SFJ      Full                                                        +---------+---------------+---------+-----------+----------+--------------+ FV Prox  Full                                                        +---------+---------------+---------+-----------+----------+--------------+ FV Mid   Full                                                        +---------+---------------+---------+-----------+----------+--------------+ FV DistalFull                                                        +---------+---------------+---------+-----------+----------+--------------+  Height:       62.0 in Accession #:    [id number]      Weight:       113.8 lb Date of Birth:  [birth year]        BSA:          1.504 m Patient Age:    89 years        BP:           147/70 mmHg Patient Gender: F               HR:           51 bpm. Exam Location:  Inpatient Procedure: 2D Echo, Cardiac Doppler and Color Doppler Indications:    elevated troponin  History:        Patient has no prior history of Echocardiogram examinations.                 Stroke; Risk Factors:Hypertension and Dyslipidemia.  Sonographer:    Mike Gip Referring Phys: [id number] JUSTIN B HOWERTER IMPRESSIONS  1. Left ventricular ejection fraction, by estimation, is 55 to 60%. The left ventricle has normal function. The left ventricle has no regional wall motion abnormalities. Left ventricular diastolic parameters are consistent with Grade II diastolic dysfunction (pseudonormalization).  2. Right ventricular systolic function is normal. The right ventricular size is normal. There is mildly elevated pulmonary artery systolic pressure. The estimated right ventricular systolic pressure is 39.7 mmHg.  3. Left atrial size was moderately dilated.  4. The mitral valve is degenerative. Mild to moderate mitral valve regurgitation. No evidence of mitral stenosis.  5. Tricuspid valve regurgitation is moderate.  6. The aortic valve is tricuspid. There is moderate calcification of the aortic valve. Aortic valve regurgitation is trivial. No aortic stenosis is present.  7. The inferior vena cava is dilated in size with >50% respiratory variability, suggesting right atrial pressure of 8 mmHg. FINDINGS  Left Ventricle: Left ventricular ejection fraction, by estimation, is 55 to 60%. The left ventricle has normal function. The left ventricle has no regional  wall motion abnormalities. The left ventricular internal cavity size was normal in size. There is  no left ventricular hypertrophy. Left ventricular diastolic parameters are consistent with Grade II diastolic dysfunction (pseudonormalization). Right Ventricle: The right ventricular size is normal. No increase in right ventricular wall thickness. Right ventricular systolic function is normal. There is mildly elevated pulmonary artery systolic pressure. The tricuspid regurgitant velocity is 3.03  m/s, and with an assumed right atrial pressure of 3 mmHg, the estimated right ventricular systolic pressure is 39.7 mmHg. Left Atrium: Left atrial size was moderately dilated. Right Atrium: Right atrial size was normal in size. Pericardium: There is no evidence of pericardial effusion. Mitral Valve: The mitral valve is degenerative in appearance. Mild to moderate mitral valve regurgitation, with posteriorly-directed jet. No evidence of mitral valve stenosis. Tricuspid Valve: The tricuspid valve is normal in structure. Tricuspid valve regurgitation is moderate . No evidence of tricuspid stenosis. Aortic Valve: The aortic valve is tricuspid. There is moderate calcification of the aortic valve. Aortic valve regurgitation is trivial. Aortic regurgitation PHT measures 763 msec. No aortic stenosis is present. Pulmonic Valve: The pulmonic valve was not well visualized. Pulmonic valve regurgitation is trivial. No evidence of pulmonic stenosis. Aorta: The aortic root is normal in size and structure. Venous: The inferior vena cava is dilated in size with greater than 50% respiratory variability, suggesting right atrial pressure of 8 mmHg. IAS/Shunts: No atrial level shunt  Height:       62.0 in Accession #:    [id number]      Weight:       113.8 lb Date of Birth:  [birth year]        BSA:          1.504 m Patient Age:    89 years        BP:           147/70 mmHg Patient Gender: F               HR:           51 bpm. Exam Location:  Inpatient Procedure: 2D Echo, Cardiac Doppler and Color Doppler Indications:    elevated troponin  History:        Patient has no prior history of Echocardiogram examinations.                 Stroke; Risk Factors:Hypertension and Dyslipidemia.  Sonographer:    Mike Gip Referring Phys: [id number] JUSTIN B HOWERTER IMPRESSIONS  1. Left ventricular ejection fraction, by estimation, is 55 to 60%. The left ventricle has normal function. The left ventricle has no regional wall motion abnormalities. Left ventricular diastolic parameters are consistent with Grade II diastolic dysfunction (pseudonormalization).  2. Right ventricular systolic function is normal. The right ventricular size is normal. There is mildly elevated pulmonary artery systolic pressure. The estimated right ventricular systolic pressure is 39.7 mmHg.  3. Left atrial size was moderately dilated.  4. The mitral valve is degenerative. Mild to moderate mitral valve regurgitation. No evidence of mitral stenosis.  5. Tricuspid valve regurgitation is moderate.  6. The aortic valve is tricuspid. There is moderate calcification of the aortic valve. Aortic valve regurgitation is trivial. No aortic stenosis is present.  7. The inferior vena cava is dilated in size with >50% respiratory variability, suggesting right atrial pressure of 8 mmHg. FINDINGS  Left Ventricle: Left ventricular ejection fraction, by estimation, is 55 to 60%. The left ventricle has normal function. The left ventricle has no regional  wall motion abnormalities. The left ventricular internal cavity size was normal in size. There is  no left ventricular hypertrophy. Left ventricular diastolic parameters are consistent with Grade II diastolic dysfunction (pseudonormalization). Right Ventricle: The right ventricular size is normal. No increase in right ventricular wall thickness. Right ventricular systolic function is normal. There is mildly elevated pulmonary artery systolic pressure. The tricuspid regurgitant velocity is 3.03  m/s, and with an assumed right atrial pressure of 3 mmHg, the estimated right ventricular systolic pressure is 39.7 mmHg. Left Atrium: Left atrial size was moderately dilated. Right Atrium: Right atrial size was normal in size. Pericardium: There is no evidence of pericardial effusion. Mitral Valve: The mitral valve is degenerative in appearance. Mild to moderate mitral valve regurgitation, with posteriorly-directed jet. No evidence of mitral valve stenosis. Tricuspid Valve: The tricuspid valve is normal in structure. Tricuspid valve regurgitation is moderate . No evidence of tricuspid stenosis. Aortic Valve: The aortic valve is tricuspid. There is moderate calcification of the aortic valve. Aortic valve regurgitation is trivial. Aortic regurgitation PHT measures 763 msec. No aortic stenosis is present. Pulmonic Valve: The pulmonic valve was not well visualized. Pulmonic valve regurgitation is trivial. No evidence of pulmonic stenosis. Aorta: The aortic root is normal in size and structure. Venous: The inferior vena cava is dilated in size with greater than 50% respiratory variability, suggesting right atrial pressure of 8 mmHg. IAS/Shunts: No atrial level shunt  Height:       62.0 in Accession #:    [id number]      Weight:       113.8 lb Date of Birth:  [birth year]        BSA:          1.504 m Patient Age:    89 years        BP:           147/70 mmHg Patient Gender: F               HR:           51 bpm. Exam Location:  Inpatient Procedure: 2D Echo, Cardiac Doppler and Color Doppler Indications:    elevated troponin  History:        Patient has no prior history of Echocardiogram examinations.                 Stroke; Risk Factors:Hypertension and Dyslipidemia.  Sonographer:    Mike Gip Referring Phys: [id number] JUSTIN B HOWERTER IMPRESSIONS  1. Left ventricular ejection fraction, by estimation, is 55 to 60%. The left ventricle has normal function. The left ventricle has no regional wall motion abnormalities. Left ventricular diastolic parameters are consistent with Grade II diastolic dysfunction (pseudonormalization).  2. Right ventricular systolic function is normal. The right ventricular size is normal. There is mildly elevated pulmonary artery systolic pressure. The estimated right ventricular systolic pressure is 39.7 mmHg.  3. Left atrial size was moderately dilated.  4. The mitral valve is degenerative. Mild to moderate mitral valve regurgitation. No evidence of mitral stenosis.  5. Tricuspid valve regurgitation is moderate.  6. The aortic valve is tricuspid. There is moderate calcification of the aortic valve. Aortic valve regurgitation is trivial. No aortic stenosis is present.  7. The inferior vena cava is dilated in size with >50% respiratory variability, suggesting right atrial pressure of 8 mmHg. FINDINGS  Left Ventricle: Left ventricular ejection fraction, by estimation, is 55 to 60%. The left ventricle has normal function. The left ventricle has no regional  wall motion abnormalities. The left ventricular internal cavity size was normal in size. There is  no left ventricular hypertrophy. Left ventricular diastolic parameters are consistent with Grade II diastolic dysfunction (pseudonormalization). Right Ventricle: The right ventricular size is normal. No increase in right ventricular wall thickness. Right ventricular systolic function is normal. There is mildly elevated pulmonary artery systolic pressure. The tricuspid regurgitant velocity is 3.03  m/s, and with an assumed right atrial pressure of 3 mmHg, the estimated right ventricular systolic pressure is 39.7 mmHg. Left Atrium: Left atrial size was moderately dilated. Right Atrium: Right atrial size was normal in size. Pericardium: There is no evidence of pericardial effusion. Mitral Valve: The mitral valve is degenerative in appearance. Mild to moderate mitral valve regurgitation, with posteriorly-directed jet. No evidence of mitral valve stenosis. Tricuspid Valve: The tricuspid valve is normal in structure. Tricuspid valve regurgitation is moderate . No evidence of tricuspid stenosis. Aortic Valve: The aortic valve is tricuspid. There is moderate calcification of the aortic valve. Aortic valve regurgitation is trivial. Aortic regurgitation PHT measures 763 msec. No aortic stenosis is present. Pulmonic Valve: The pulmonic valve was not well visualized. Pulmonic valve regurgitation is trivial. No evidence of pulmonic stenosis. Aorta: The aortic root is normal in size and structure. Venous: The inferior vena cava is dilated in size with greater than 50% respiratory variability, suggesting right atrial pressure of 8 mmHg. IAS/Shunts: No atrial level shunt  Subjective:  Patient ID: Steele Berg, female    DOB: Apr 29, 1931  Age: 89 y.o. MRN: [id number]  CC: Hallucinations (Pt states she has had an episode of Hallucination on 10/15/2023 where she seen 3/4 girls across a counter and they were talking, pt stated she seen her daughter and asked about her hair growth. Pt stated her vision came back to her briefly. Pt states she has a headache at the back of her head as well that started today.//Pt has concern about the size of two veins b/l temple area. Pt also concerned about low BP.)   HPI [facility] presents for visual hallucinations episode x 2 hrs yesterday, resolved. Pt states she has had an episode of Hallucination on 10/15/2023 where she seen 3/4 girls across a counter and they were talking, pt stated she seen her daughter and asked about her hair growth. Pt stated her vision came back to her briefly. Pt states she has a headache at the back of her head as well that started today.//Pt has concern about the size of two veins b/l temple area. Pt also concerned about low BP today.  She is complaining of anxiety, does not want to be a burden to her family. No new meds.  Never drinks alcohol.  She is here today with her 2 sons Amada Jupiter and [name].  Outpatient Medications Prior to Visit  Medication Sig Dispense Refill  . acetaminophen (TYLENOL) 325 MG tablet Take 2 tablets (650 mg total) by mouth every 6 (six) hours as needed for moderate pain or fever.    . Alcohol Swabs (TRUE COMFORT ALCOHOL PREP PADS) 70 % PADS Use as directed 300 each 1  . allopurinol (ZYLOPRIM) 100 MG tablet Take 0.5 tablets (50 mg total) by mouth daily. 90 tablet 1  . amLODipine (NORVASC) 5 MG tablet Take 1 tablet (5 mg total) by mouth daily. 90 tablet 3  . ascorbic acid (VITAMIN C) 500 MG tablet TAKE 1 TABLET BY MOUTH EVERY DAY 30 tablet 11  . ASPIRIN LOW DOSE 81 MG tablet TAKE 1 TABLET BY MOUTH DAILY. SWALLOW WHOLE. 90 tablet 3  . atropine 1 % ophthalmic solution Place 1  drop into both eyes daily.    . Blood Glucose Calibration (TRUE METRIX LEVEL 1) Low SOLN USE AS DIRECTED 3 each 3  . brimonidine (ALPHAGAN) 0.2 % ophthalmic solution Place 1 drop into the right eye 2 (two) times daily.    . calcium carbonate (OSCAL) 1500 (600 Ca) MG TABS tablet TAKE 1 TABLET BY MOUTH EVERY DAY 90 tablet 3  . Cholecalciferol (VITAMIN D3) 25 MCG (1000 UT) CAPS Take 1 capsule (1,000 Units total) by mouth daily. 90 capsule 3  . CVS VITAMIN B12 1000 MCG tablet TAKE 1 TABLET (1,000 MCG TOTAL) BY MOUTH EVERY DAY 90 tablet 3  . diclofenac sodium (VOLTAREN) 1 % GEL Apply 2 g topically 3 (three) times daily. To left shoulder 200 g 3  . docusate sodium (COLACE) 100 MG capsule TAKE 1 CAPSULE BY MOUTH TWICE A DAY 180 capsule 3  . dorzolamide-timolol (COSOPT) 22.3-6.8 MG/ML ophthalmic solution Place 1 drop into both eyes 2 (two) times daily.     . feeding supplement, ENSURE ENLIVE, (ENSURE ENLIVE) LIQD Take 237 mLs by mouth daily at 2 PM. 237 mL 12  . gabapentin (NEURONTIN) 100 MG capsule Take 1 capsule (100 mg total) by mouth 2 (two) times daily. 180 capsule 5  . glucose blood (GNP TRUE METRIX GLUCOSE STRIPS) test strip Use  09/11/23 105 lb (47.6 kg)  08/12/23 105 lb (47.6 kg)    Physical Exam  Lab Results  Component Value Date   WBC 9.3 09/11/2023   HGB 9.4 (L) 09/11/2023   HCT 30.5 (L) 09/11/2023   PLT 259.0 09/11/2023   GLUCOSE 99 09/11/2023   CHOL 127 03/01/2023   TRIG 42 03/01/2023   HDL 53 03/01/2023   LDLCALC 66 03/01/2023   ALT 5 09/11/2023   AST 11 09/11/2023   NA 141 09/11/2023   K 3.8 09/11/2023   CL 104 09/11/2023   CREATININE 1.09 09/11/2023   BUN 24 (H) 09/11/2023   CO2 27 09/11/2023   TSH 0.54 09/11/2023   HGBA1C 6.8 (H) 09/11/2023    VAS [place] LOWER EXTREMITY VENOUS (DVT)  Result Date: 03/03/2023  Lower Venous DVT Study Patient Name:  Breanna [name]  Date of Exam:   03/01/2023 Medical Rec #: [id number]        Accession #:    [id number] Date of Birth: Jun 09, 1931         Patient Gender: F Patient Age:   89 years Exam Location:  [facility] Procedure:      VAS [place] LOWER EXTREMITY VENOUS (DVT) Referring Phys: RIPUDEEP RAI --------------------------------------------------------------------------------  Indications: Edema.  Risk Factors: None identified. Limitations: Poor ultrasound/tissue interface. Comparison Study: No prior studies. Performing Technologist: Chanda Busing RVT  Examination Guidelines: A complete evaluation includes B-mode imaging, spectral Doppler, color Doppler, and power Doppler as needed of all accessible portions of each vessel. Bilateral testing is considered an integral part of a complete  examination. Limited examinations for reoccurring indications may be performed as noted. The reflux portion of the exam is performed with the patient in reverse Trendelenburg.  +---------+---------------+---------+-----------+----------+--------------+ RIGHT    CompressibilityPhasicitySpontaneityPropertiesThrombus Aging +---------+---------------+---------+-----------+----------+--------------+ CFV      Full           Yes      Yes                                 +---------+---------------+---------+-----------+----------+--------------+ SFJ      Full                                                        +---------+---------------+---------+-----------+----------+--------------+ FV Prox  Full                                                        +---------+---------------+---------+-----------+----------+--------------+ FV Mid   Full                                                        +---------+---------------+---------+-----------+----------+--------------+ FV DistalFull                                                        +---------+---------------+---------+-----------+----------+--------------+

## 2023-10-17 ENCOUNTER — Encounter: Payer: Self-pay | Admitting: Internal Medicine

## 2023-10-17 ENCOUNTER — Telehealth: Payer: Self-pay | Admitting: *Deleted

## 2023-10-17 DIAGNOSIS — F439 Reaction to severe stress, unspecified: Secondary | ICD-10-CM | POA: Insufficient documentation

## 2023-10-17 DIAGNOSIS — H543 Unqualified visual loss, both eyes: Secondary | ICD-10-CM | POA: Insufficient documentation

## 2023-10-17 NOTE — Progress Notes (Signed)
Care Coordination   Note   10/17/2023 Name: Breanna [name] MRN: [id number] DOB: 03/18/1931  Breanna [name] is a 89 y.o. year old female who sees Plotnikov, Georgina Quint, MD for primary care. I reached out to [employer] by phone today to offer care coordination services.  [name] was given information about Care Coordination services today including:   The Care Coordination services include support from the care team which includes your Nurse Coordinator, Clinical Social Worker, or Pharmacist.  The Care Coordination team is here to help remove barriers to the health concerns and goals most important to you. Care Coordination services are voluntary, and the patient may decline or stop services at any time by request to their care team member.   Care Coordination Consent Status: Patient agreed to services and verbal consent obtained.   Follow up plan:  Telephone appointment with care coordination team member scheduled for:  10/22/2023  Encounter Outcome:  Patient Scheduled from referral   Burman Nieves, [facility] Care Coordination Care Guide Direct Dial: [phone]

## 2023-10-17 NOTE — Assessment & Plan Note (Signed)
Chronic On Metformin 

## 2023-10-17 NOTE — Assessment & Plan Note (Signed)
Unchanged On Gabapentin 100 mg bid

## 2023-10-17 NOTE — Assessment & Plan Note (Signed)
Psychology referral was made

## 2023-10-17 NOTE — Assessment & Plan Note (Signed)
The headache was occipital.  It has resolved.  Blood pressure is normal.  Doubt giant cell arteritis.  Obtain sed rate

## 2023-10-17 NOTE — Assessment & Plan Note (Signed)
Due to advanced glaucoma. Social work consulted patient to help with resources for home care/supervision. Psychology consultation

## 2023-10-18 ENCOUNTER — Telehealth: Payer: Self-pay | Admitting: Internal Medicine

## 2023-10-18 DIAGNOSIS — I129 Hypertensive chronic kidney disease with stage 1 through stage 4 chronic kidney disease, or unspecified chronic kidney disease: Secondary | ICD-10-CM | POA: Diagnosis not present

## 2023-10-18 DIAGNOSIS — R296 Repeated falls: Secondary | ICD-10-CM | POA: Diagnosis not present

## 2023-10-18 DIAGNOSIS — D631 Anemia in chronic kidney disease: Secondary | ICD-10-CM | POA: Diagnosis not present

## 2023-10-18 DIAGNOSIS — M5441 Lumbago with sciatica, right side: Secondary | ICD-10-CM | POA: Diagnosis not present

## 2023-10-18 DIAGNOSIS — H401133 Primary open-angle glaucoma, bilateral, severe stage: Secondary | ICD-10-CM | POA: Diagnosis not present

## 2023-10-18 DIAGNOSIS — R269 Unspecified abnormalities of gait and mobility: Secondary | ICD-10-CM | POA: Diagnosis not present

## 2023-10-18 DIAGNOSIS — E1122 Type 2 diabetes mellitus with diabetic chronic kidney disease: Secondary | ICD-10-CM | POA: Diagnosis not present

## 2023-10-18 DIAGNOSIS — N1832 Chronic kidney disease, stage 3b: Secondary | ICD-10-CM | POA: Diagnosis not present

## 2023-10-18 DIAGNOSIS — G8929 Other chronic pain: Secondary | ICD-10-CM | POA: Diagnosis not present

## 2023-10-18 NOTE — Telephone Encounter (Signed)
Pt son called asking about a medication that was suppose to be prescribed the day of her visit 10/23 pt son do not know the name of the medication. Please can someone assist pt with this situation. Thank you.

## 2023-10-18 NOTE — Telephone Encounter (Signed)
Please call son Angelys Flum back about this medication Dr. Macario Golds supposed to have prescribed read pt son the message but he still saying that is not right.  Best call is [phone]

## 2023-10-21 NOTE — Telephone Encounter (Signed)
We were not going to prescribe any medication at the visit.  I am sorry if we had the misunderstanding.  If Breanna [name] starts to have headaches again -we were going to proceed with the temporal artery biopsy.  Then, if needed, we would start the medication prednisone (if the biopsy is positive for temporal artery arteritis).  Thanks

## 2023-10-21 NOTE — Telephone Encounter (Signed)
Called patient son and informed him of provider recommendation

## 2023-10-22 ENCOUNTER — Ambulatory Visit: Payer: Self-pay | Admitting: Licensed Clinical Social Worker

## 2023-10-22 DIAGNOSIS — G8929 Other chronic pain: Secondary | ICD-10-CM | POA: Diagnosis not present

## 2023-10-22 DIAGNOSIS — N1832 Chronic kidney disease, stage 3b: Secondary | ICD-10-CM | POA: Diagnosis not present

## 2023-10-22 DIAGNOSIS — M5441 Lumbago with sciatica, right side: Secondary | ICD-10-CM | POA: Diagnosis not present

## 2023-10-22 DIAGNOSIS — H401133 Primary open-angle glaucoma, bilateral, severe stage: Secondary | ICD-10-CM | POA: Diagnosis not present

## 2023-10-22 DIAGNOSIS — E1122 Type 2 diabetes mellitus with diabetic chronic kidney disease: Secondary | ICD-10-CM | POA: Diagnosis not present

## 2023-10-22 DIAGNOSIS — D631 Anemia in chronic kidney disease: Secondary | ICD-10-CM | POA: Diagnosis not present

## 2023-10-22 DIAGNOSIS — I129 Hypertensive chronic kidney disease with stage 1 through stage 4 chronic kidney disease, or unspecified chronic kidney disease: Secondary | ICD-10-CM | POA: Diagnosis not present

## 2023-10-22 DIAGNOSIS — R269 Unspecified abnormalities of gait and mobility: Secondary | ICD-10-CM | POA: Diagnosis not present

## 2023-10-22 DIAGNOSIS — R296 Repeated falls: Secondary | ICD-10-CM | POA: Diagnosis not present

## 2023-10-24 NOTE — Patient Outreach (Signed)
Care Coordination   Initial Visit Note   10/22/2023 Name: Breanna [name] MRN: [id number] DOB: 07-08-1931  Breanna [name] is a 89 y.o. year old female who sees Breanna [name], Breanna Quint, MD for primary care. I spoke with  Breanna [name] by phone today.  What matters to the patients health and wellness today?  LCSW A Breanna [name] spk with patient's son Breanna [name] regarding additional help for personal care assistance at home for Breanna [name]    Goals Addressed             This Visit's Progress    Care Coordination       Activities and task to complete in order to accomplish goals.   Explore alternative options for personal care services.    Delegate task with trusted family and friends.          SDOH assessments and interventions completed:  Yes  SDOH Interventions Today    Flowsheet Row Most Recent Value  SDOH Interventions   Food Insecurity Interventions Intervention Not Indicated  Housing Interventions Intervention Not Indicated  Transportation Interventions Intervention Not Indicated  Utilities Interventions Intervention Not Indicated        Care Coordination Interventions:  Yes, provided  Interventions Today    Flowsheet Row Most Recent Value  Chronic Disease   Chronic disease during today's visit Other  [blindness]  General Interventions   General Interventions Discussed/Reviewed Communication with  Communication with PCP/Specialists  [retrieving information regarding current home health]  Nutrition Interventions   Nutrition Discussed/Reviewed Nutrition Discussed  [Pt son Breanna [name] reports Breanna [name] appetitie is good]        Follow up plan: Follow up call scheduled for 11/06/2023    Encounter Outcome:  Patient Visit Completed   Gwyndolyn Saxon MSW, LCSW Licensed Clinical Social Worker  [facility], Population Health Direct Dial: [phone]  Fax: [phone]

## 2023-10-28 DIAGNOSIS — I129 Hypertensive chronic kidney disease with stage 1 through stage 4 chronic kidney disease, or unspecified chronic kidney disease: Secondary | ICD-10-CM | POA: Diagnosis not present

## 2023-10-28 DIAGNOSIS — N1832 Chronic kidney disease, stage 3b: Secondary | ICD-10-CM | POA: Diagnosis not present

## 2023-10-28 DIAGNOSIS — G8929 Other chronic pain: Secondary | ICD-10-CM | POA: Diagnosis not present

## 2023-10-28 DIAGNOSIS — R269 Unspecified abnormalities of gait and mobility: Secondary | ICD-10-CM | POA: Diagnosis not present

## 2023-10-28 DIAGNOSIS — R296 Repeated falls: Secondary | ICD-10-CM | POA: Diagnosis not present

## 2023-10-28 DIAGNOSIS — E1122 Type 2 diabetes mellitus with diabetic chronic kidney disease: Secondary | ICD-10-CM | POA: Diagnosis not present

## 2023-10-28 DIAGNOSIS — H401133 Primary open-angle glaucoma, bilateral, severe stage: Secondary | ICD-10-CM | POA: Diagnosis not present

## 2023-10-28 DIAGNOSIS — M5441 Lumbago with sciatica, right side: Secondary | ICD-10-CM | POA: Diagnosis not present

## 2023-10-28 DIAGNOSIS — D631 Anemia in chronic kidney disease: Secondary | ICD-10-CM | POA: Diagnosis not present

## 2023-10-29 ENCOUNTER — Ambulatory Visit: Payer: Medicare PPO | Admitting: Podiatry

## 2023-11-04 DIAGNOSIS — R269 Unspecified abnormalities of gait and mobility: Secondary | ICD-10-CM | POA: Diagnosis not present

## 2023-11-04 DIAGNOSIS — M5441 Lumbago with sciatica, right side: Secondary | ICD-10-CM | POA: Diagnosis not present

## 2023-11-04 DIAGNOSIS — E1122 Type 2 diabetes mellitus with diabetic chronic kidney disease: Secondary | ICD-10-CM | POA: Diagnosis not present

## 2023-11-04 DIAGNOSIS — H401133 Primary open-angle glaucoma, bilateral, severe stage: Secondary | ICD-10-CM | POA: Diagnosis not present

## 2023-11-04 DIAGNOSIS — R296 Repeated falls: Secondary | ICD-10-CM | POA: Diagnosis not present

## 2023-11-04 DIAGNOSIS — G8929 Other chronic pain: Secondary | ICD-10-CM | POA: Diagnosis not present

## 2023-11-04 DIAGNOSIS — N1832 Chronic kidney disease, stage 3b: Secondary | ICD-10-CM | POA: Diagnosis not present

## 2023-11-04 DIAGNOSIS — I129 Hypertensive chronic kidney disease with stage 1 through stage 4 chronic kidney disease, or unspecified chronic kidney disease: Secondary | ICD-10-CM | POA: Diagnosis not present

## 2023-11-04 DIAGNOSIS — D631 Anemia in chronic kidney disease: Secondary | ICD-10-CM | POA: Diagnosis not present

## 2023-11-05 ENCOUNTER — Other Ambulatory Visit: Payer: Self-pay | Admitting: Internal Medicine

## 2023-11-05 DIAGNOSIS — Z1231 Encounter for screening mammogram for malignant neoplasm of breast: Secondary | ICD-10-CM

## 2023-11-13 ENCOUNTER — Ambulatory Visit: Payer: Medicare PPO | Admitting: Internal Medicine

## 2023-11-13 ENCOUNTER — Encounter: Payer: Self-pay | Admitting: Internal Medicine

## 2023-11-13 VITALS — BP 128/78 | HR 50 | Temp 98.4°F | Ht 62.0 in | Wt 113.2 lb

## 2023-11-13 DIAGNOSIS — R634 Abnormal weight loss: Secondary | ICD-10-CM | POA: Diagnosis not present

## 2023-11-13 DIAGNOSIS — Z7984 Long term (current) use of oral hypoglycemic drugs: Secondary | ICD-10-CM | POA: Diagnosis not present

## 2023-11-13 DIAGNOSIS — E1121 Type 2 diabetes mellitus with diabetic nephropathy: Secondary | ICD-10-CM

## 2023-11-13 DIAGNOSIS — H5316 Psychophysical visual disturbances: Secondary | ICD-10-CM

## 2023-11-13 DIAGNOSIS — N1832 Chronic kidney disease, stage 3b: Secondary | ICD-10-CM | POA: Diagnosis not present

## 2023-11-13 DIAGNOSIS — I10 Essential (primary) hypertension: Secondary | ICD-10-CM | POA: Diagnosis not present

## 2023-11-13 NOTE — Assessment & Plan Note (Signed)
Spironolactone 3/wk, Losartan 1/2 tab a day - low BP BP Readings from Last 3 Encounters:  11/13/23 128/78  10/17/23 130/72  09/11/23 130/70

## 2023-11-13 NOTE — Assessment & Plan Note (Signed)
Hydrate well 

## 2023-11-13 NOTE — Assessment & Plan Note (Signed)
Chronic On Metformin 

## 2023-11-13 NOTE — Assessment & Plan Note (Signed)
2 episodes total so far. Doing well

## 2023-11-13 NOTE — Assessment & Plan Note (Signed)
Resolved

## 2023-11-13 NOTE — Progress Notes (Signed)
Subjective:  Patient ID: Breanna [name], female    DOB: March 02, 1931  Age: 89 y.o. MRN: [id number]  CC: Follow-up (4 Week Follow Up)   HPI [employer] presents for vision loss Murry had one more hallucination since her last OV [facility] was great F/u on DM, HTN  Outpatient Medications Prior to Visit  Medication Sig Dispense Refill   acetaminophen (TYLENOL) 325 MG tablet Take 2 tablets (650 mg total) by mouth every 6 (six) hours as needed for moderate pain or fever.     Alcohol Swabs (TRUE COMFORT ALCOHOL PREP PADS) 70 % PADS Use as directed 300 each 1   allopurinol (ZYLOPRIM) 100 MG tablet Take 0.5 tablets (50 mg total) by mouth daily. 90 tablet 1   amLODipine (NORVASC) 5 MG tablet Take 1 tablet (5 mg total) by mouth daily. 90 tablet 3   ascorbic acid (VITAMIN C) 500 MG tablet TAKE 1 TABLET BY MOUTH EVERY DAY 30 tablet 11   ASPIRIN LOW DOSE 81 MG tablet TAKE 1 TABLET BY MOUTH DAILY. SWALLOW WHOLE. 90 tablet 3   atropine 1 % ophthalmic solution Place 1 drop into both eyes daily.     Blood Glucose Calibration (TRUE METRIX LEVEL 1) Low SOLN USE AS DIRECTED 3 each 3   brimonidine (ALPHAGAN) 0.2 % ophthalmic solution Place 1 drop into the right eye 2 (two) times daily.     calcium carbonate (OSCAL) 1500 (600 Ca) MG TABS tablet TAKE 1 TABLET BY MOUTH EVERY DAY 90 tablet 3   Cholecalciferol (VITAMIN D3) 25 MCG (1000 UT) CAPS Take 1 capsule (1,000 Units total) by mouth daily. 90 capsule 3   [employer] VITAMIN B12 1000 MCG tablet TAKE 1 TABLET (1,000 MCG TOTAL) BY MOUTH EVERY DAY 90 tablet 3   diclofenac sodium (VOLTAREN) 1 % GEL Apply 2 g topically 3 (three) times daily. To left shoulder 200 g 3   docusate sodium (COLACE) 100 MG capsule TAKE 1 CAPSULE BY MOUTH TWICE A DAY 180 capsule 3   dorzolamide-timolol (COSOPT) 22.3-6.8 MG/ML ophthalmic solution Place 1 drop into both eyes 2 (two) times daily.      feeding supplement, ENSURE ENLIVE, (ENSURE ENLIVE) LIQD Take 237 mLs by mouth daily at 2 PM. 237  mL 12   gabapentin (NEURONTIN) 100 MG capsule Take 1 capsule (100 mg total) by mouth 2 (two) times daily. 180 capsule 5   glucose blood (GNP TRUE METRIX GLUCOSE STRIPS) test strip Use to check blood sugars twice a day 200 each 2   latanoprost (XALATAN) 0.005 % ophthalmic solution Place 1 drop into both eyes at bedtime.      lidocaine (LIDODERM) 5 % Place 1 patch onto the skin daily. Remove & Discard patch within 12 hours or as directed by MD. Apply to left shoulder 30 patch 0   lidocaine-prilocaine (EMLA) cream Apply 1 application topically as needed. 30 g 0   losartan (COZAAR) 100 MG tablet Take 0.5 tablets (50 mg total) by mouth daily.     metFORMIN (GLUCOPHAGE) 500 MG tablet TAKE 1 TABLET BY MOUTH EVERY DAY WITH BREAKFAST 90 tablet 1   mupirocin ointment (BACTROBAN) 2 % On leg wound w/dressing change qd or bid 30 g 0   Netarsudil-Latanoprost 0.02-0.005 % SOLN Apply to eye.     NON FORMULARY Place 10 drops under the tongue daily as needed (Pain). Medication: CBD oil     pantoprazole (PROTONIX) 40 MG tablet TAKE 1 TABLET BY MOUTH EVERY DAY 90 tablet 3  polyethylene glycol (MIRALAX / GLYCOLAX) 17 g packet Take 17 g by mouth daily as needed for mild constipation. 14 each 0   senna (SENOKOT) 8.6 MG TABS tablet Take 1 tablet (8.6 mg total) by mouth 2 (two) times daily. 120 tablet 0   sertraline (ZOLOFT) 100 MG tablet TAKE 1 TABLET BY MOUTH EVERY DAY 90 tablet 1   spironolactone (ALDACTONE) 25 MG tablet TAKE 1 TAB BY MOUTH MON - WED - FRI 90 tablet 1   traMADol (ULTRAM) 50 MG tablet Take 1 tablet (50 mg total) by mouth every 8 (eight) hours as needed for severe pain. 30 tablet 0   TRUEplus Lancets 33G MISC USE AS DIRECTED TO CHECK BLOOD SUGAR TWICE A DAY 200 each 1   valACYclovir (VALTREX) 500 MG tablet Take 1 tablet (500 mg total) by mouth daily as needed (for outbreaks).     Facility-Administered Medications Prior to Visit  Medication Dose Route Frequency Provider Last Rate Last Admin    regadenoson (LEXISCAN) injection SOLN 0.4 mg  0.4 mg Intravenous Once Meriam Sprague, MD       technetium tetrofosmin (TC-MYOVIEW) injection 31.1 millicurie  31.1 millicurie Intravenous Once PRN Meriam Sprague, MD        ROS: Review of Systems  Constitutional:  Negative for activity change, appetite change, chills, fatigue and unexpected weight change.  HENT:  Negative for congestion, mouth sores and sinus pressure.   Eyes:  Positive for visual disturbance.  Respiratory:  Negative for cough and chest tightness.   Gastrointestinal:  Negative for abdominal pain and nausea.  Genitourinary:  Negative for difficulty urinating, frequency and vaginal pain.  Musculoskeletal:  Positive for gait problem. Negative for back pain.  Skin:  Negative for pallor and rash.  Neurological:  Negative for dizziness, tremors, weakness, numbness and headaches.  Psychiatric/Behavioral:  Negative for confusion, sleep disturbance and suicidal ideas. The patient is not nervous/anxious.     Objective:  BP 128/78   Pulse (!) 50   Temp 98.4 F (36.9 C) (Oral)   Ht 5\' 2"  (1.575 m)   Wt 113 lb 3.2 oz (51.3 kg)   SpO2 99%   BMI 20.70 kg/m   BP Readings from Last 3 Encounters:  11/13/23 128/78  10/17/23 130/72  09/11/23 130/70    Wt Readings from Last 3 Encounters:  11/13/23 113 lb 3.2 oz (51.3 kg)  10/16/23 112 lb (50.8 kg)  09/11/23 105 lb (47.6 kg)    Physical Exam Constitutional:      General: She is not in acute distress.    Appearance: She is well-developed.  HENT:     Head: Normocephalic.     Right Ear: External ear normal.     Left Ear: External ear normal.     Nose: Nose normal.  Eyes:     General:        Right eye: No discharge.        Left eye: No discharge.     Conjunctiva/sclera: Conjunctivae normal.     Pupils: Pupils are equal, round, and reactive to light.  Neck:     Thyroid: No thyromegaly.     Vascular: No JVD.     Trachea: No tracheal deviation.   Cardiovascular:     Rate and Rhythm: Normal rate and regular rhythm.     Heart sounds: Normal heart sounds.  Pulmonary:     Effort: No respiratory distress.     Breath sounds: No stridor. No wheezing.  Abdominal:  General: Bowel sounds are normal. There is no distension.     Palpations: Abdomen is soft. There is no mass.     Tenderness: There is no abdominal tenderness. There is no guarding or rebound.  Musculoskeletal:        General: No tenderness.     Cervical back: Normal range of motion and neck supple. No rigidity.     Right lower leg: No edema.     Left lower leg: No edema.  Lymphadenopathy:     Cervical: No cervical adenopathy.  Skin:    Findings: No erythema or rash.  Neurological:     Mental Status: She is oriented to person, place, and time. Mental status is at baseline.     Cranial Nerves: No cranial nerve deficit.     Motor: Weakness present. No abnormal muscle tone.     Coordination: Coordination abnormal.     Gait: Gait abnormal.     Deep Tendon Reflexes: Reflexes normal.  Psychiatric:        Behavior: Behavior normal.        Thought Content: Thought content normal.        Judgment: Judgment normal.   In a w/c  Lab Results  Component Value Date   WBC 5.6 10/16/2023   HGB 9.9 (L) 10/16/2023   HCT 32.7 (L) 10/16/2023   PLT 184.0 10/16/2023   GLUCOSE 133 (H) 10/16/2023   CHOL 127 03/01/2023   TRIG 42 03/01/2023   HDL 53 03/01/2023   LDLCALC 66 03/01/2023   ALT 9 10/16/2023   AST 18 10/16/2023   NA 139 10/16/2023   K 4.6 10/16/2023   CL 105 10/16/2023   CREATININE 1.30 (H) 10/16/2023   BUN 32 (H) 10/16/2023   CO2 28 10/16/2023   TSH 0.54 09/11/2023   HGBA1C 6.8 (H) 09/11/2023    VAS [place] LOWER EXTREMITY VENOUS (DVT)  Result Date: 03/03/2023  Lower Venous DVT Study Patient Name:  GENECE WAHLERT  Date of Exam:   03/01/2023 Medical Rec #: [id number]        Accession #:    [id number] Date of Birth: Jan 15, 1931         Patient Gender: F Patient Age:   43  years Exam Location:  [facility] Procedure:      VAS [place] LOWER EXTREMITY VENOUS (DVT) Referring Phys: RIPUDEEP RAI --------------------------------------------------------------------------------  Indications: Edema.  Risk Factors: None identified. Limitations: Poor ultrasound/tissue interface. Comparison Study: No prior studies. Performing Technologist: Chanda Busing RVT  Examination Guidelines: A complete evaluation includes B-mode imaging, spectral Doppler, color Doppler, and power Doppler as needed of all accessible portions of each vessel. Bilateral testing is considered an integral part of a complete examination. Limited examinations for reoccurring indications may be performed as noted. The reflux portion of the exam is performed with the patient in reverse Trendelenburg.  +---------+---------------+---------+-----------+----------+--------------+ RIGHT    CompressibilityPhasicitySpontaneityPropertiesThrombus Aging +---------+---------------+---------+-----------+----------+--------------+ CFV      Full           Yes      Yes                                 +---------+---------------+---------+-----------+----------+--------------+ SFJ      Full                                                        +---------+---------------+---------+-----------+----------+--------------+  FV Prox  Full                                                        +---------+---------------+---------+-----------+----------+--------------+ FV Mid   Full                                                        +---------+---------------+---------+-----------+----------+--------------+ FV DistalFull                                                        +---------+---------------+---------+-----------+----------+--------------+ PFV      Full                                                        +---------+---------------+---------+-----------+----------+--------------+ POP       Full           Yes      Yes                                 +---------+---------------+---------+-----------+----------+--------------+ PTV      Full                                                        +---------+---------------+---------+-----------+----------+--------------+ PERO     Full                                                        +---------+---------------+---------+-----------+----------+--------------+   +---------+---------------+---------+-----------+----------+--------------+ LEFT     CompressibilityPhasicitySpontaneityPropertiesThrombus Aging +---------+---------------+---------+-----------+----------+--------------+ CFV      Full           Yes      Yes                                 +---------+---------------+---------+-----------+----------+--------------+ SFJ      Full                                                        +---------+---------------+---------+-----------+----------+--------------+ FV Prox  Full                                                        +---------+---------------+---------+-----------+----------+--------------+  FV Mid   Full                                                        +---------+---------------+---------+-----------+----------+--------------+ FV DistalFull                                                        +---------+---------------+---------+-----------+----------+--------------+ PFV      Full                                                        +---------+---------------+---------+-----------+----------+--------------+ POP      Full           Yes      Yes                                 +---------+---------------+---------+-----------+----------+--------------+ PTV      Full                                                        +---------+---------------+---------+-----------+----------+--------------+ PERO     Full                                                         +---------+---------------+---------+-----------+----------+--------------+     Summary: RIGHT: - There is no evidence of deep vein thrombosis in the lower extremity.  - No cystic structure found in the popliteal fossa.  LEFT: - There is no evidence of deep vein thrombosis in the lower extremity.  - No cystic structure found in the popliteal fossa.  *See table(s) above for measurements and observations. Electronically signed by Heath Lark on 03/03/2023 at 4:16:14 PM.    Final    CT Angio Chest Pulmonary Embolism (PE) W or WO Contrast  Result Date: 03/01/2023 CLINICAL DATA:  Lungs are intermediate probability for PE. Positive D-dimer. EXAM: CT ANGIOGRAPHY CHEST WITH CONTRAST TECHNIQUE: Multidetector CT imaging of the chest was performed using the standard protocol during bolus administration of intravenous contrast. Multiplanar CT image reconstructions and MIPs were obtained to evaluate the vascular anatomy. RADIATION DOSE REDUCTION: This exam was performed according to the departmental dose-optimization program which includes automated exposure control, adjustment of the mA and/or kV according to patient size and/or use of iterative reconstruction technique. CONTRAST:  75mL OMNIPAQUE IOHEXOL 350 MG/ML SOLN COMPARISON:  Chest CT 08/15/2017 FINDINGS: Cardiovascular: Heart is enlarged. Aortic is ectatic without focal dilatation. There is adequate opacification of the pulmonary arteries to the segmental level. There is no evidence for pulmonary embolism. There are atherosclerotic calcifications of the aorta. Mediastinum/Nodes: No enlarged mediastinal, hilar, or axillary lymph nodes.  Esophagus and trachea are within normal limits. The thyroid gland is diffusely heterogeneous. There are calcified mediastinal and right hilar lymph nodes. Lungs/Pleura: Lungs are clear. No pleural effusion or pneumothorax. Upper Abdomen: No acute abnormality. There are calcified granulomas in the liver and spleen.  Rounded mildly hyperdense area in the superior pole of the left kidney is new from prior measuring 9 mm. Musculoskeletal: The bones are osteopenic. Degenerative changes affect the spine. Review of the MIP images confirms the above findings. IMPRESSION: 1. No evidence for pulmonary embolism or other acute cardiopulmonary process. 2. Cardiomegaly. 3. New 9 mm hyperdense lesion in the superior pole of the left kidney. This may represent a hyperdense cyst, but is indeterminate. This can be further evaluated with ultrasound. Aortic Atherosclerosis (ICD10-I70.0). Electronically Signed   By: Darliss Cheney M.D.   On: 03/01/2023 21:40   ECHOCARDIOGRAM COMPLETE  Result Date: 03/01/2023    ECHOCARDIOGRAM REPORT   Patient Name:   MEEGHAN HELT Date of Exam: 03/01/2023 Medical Rec #:  [id number]       Height:       62.0 in Accession #:    [id number]      Weight:       113.8 lb Date of Birth:  05/18/1931        BSA:          1.504 m Patient Age:    89 years        BP:           147/70 mmHg Patient Gender: F               HR:           51 bpm. Exam Location:  Inpatient Procedure: 2D Echo, Cardiac Doppler and Color Doppler Indications:    elevated troponin  History:        Patient has no prior history of Echocardiogram examinations.                 Stroke; Risk Factors:Hypertension and Dyslipidemia.  Sonographer:    Mike Gip Referring Phys: [id number] JUSTIN B HOWERTER IMPRESSIONS  1. Left ventricular ejection fraction, by estimation, is 55 to 60%. The left ventricle has normal function. The left ventricle has no regional wall motion abnormalities. Left ventricular diastolic parameters are consistent with Grade II diastolic dysfunction (pseudonormalization).  2. Right ventricular systolic function is normal. The right ventricular size is normal. There is mildly elevated pulmonary artery systolic pressure. The estimated right ventricular systolic pressure is 39.7 mmHg.  3. Left atrial size was moderately dilated.  4. The mitral  valve is degenerative. Mild to moderate mitral valve regurgitation. No evidence of mitral stenosis.  5. Tricuspid valve regurgitation is moderate.  6. The aortic valve is tricuspid. There is moderate calcification of the aortic valve. Aortic valve regurgitation is trivial. No aortic stenosis is present.  7. The inferior vena cava is dilated in size with >50% respiratory variability, suggesting right atrial pressure of 8 mmHg. FINDINGS  Left Ventricle: Left ventricular ejection fraction, by estimation, is 55 to 60%. The left ventricle has normal function. The left ventricle has no regional wall motion abnormalities. The left ventricular internal cavity size was normal in size. There is  no left ventricular hypertrophy. Left ventricular diastolic parameters are consistent with Grade II diastolic dysfunction (pseudonormalization). Right Ventricle: The right ventricular size is normal. No increase in right ventricular wall thickness. Right ventricular systolic function is normal. There is mildly elevated pulmonary artery systolic pressure. The  tricuspid regurgitant velocity is 3.03  m/s, and with an assumed right atrial pressure of 3 mmHg, the estimated right ventricular systolic pressure is 39.7 mmHg. Left Atrium: Left atrial size was moderately dilated. Right Atrium: Right atrial size was normal in size. Pericardium: There is no evidence of pericardial effusion. Mitral Valve: The mitral valve is degenerative in appearance. Mild to moderate mitral valve regurgitation, with posteriorly-directed jet. No evidence of mitral valve stenosis. Tricuspid Valve: The tricuspid valve is normal in structure. Tricuspid valve regurgitation is moderate . No evidence of tricuspid stenosis. Aortic Valve: The aortic valve is tricuspid. There is moderate calcification of the aortic valve. Aortic valve regurgitation is trivial. Aortic regurgitation PHT measures 763 msec. No aortic stenosis is present. Pulmonic Valve: The pulmonic valve was  not well visualized. Pulmonic valve regurgitation is trivial. No evidence of pulmonic stenosis. Aorta: The aortic root is normal in size and structure. Venous: The inferior vena cava is dilated in size with greater than 50% respiratory variability, suggesting right atrial pressure of 8 mmHg. IAS/Shunts: No atrial level shunt detected by color flow Doppler.  LEFT VENTRICLE PLAX 2D LVIDd:         4.20 cm      Diastology LVIDs:         2.90 cm      LV e' medial:    4.05 cm/s LV PW:         1.30 cm      LV E/e' medial:  18.1 LV IVS:        1.40 cm      LV e' lateral:   5.43 cm/s LVOT diam:     1.90 cm      LV E/e' lateral: 13.5 LV SV:         64 LV SV Index:   42 LVOT Area:     2.84 cm  LV Volumes (MOD) LV vol d, MOD A2C: 128.0 ml LV vol d, MOD A4C: 110.0 ml LV vol s, MOD A2C: 51.8 ml LV vol s, MOD A4C: 38.1 ml LV SV MOD A2C:     76.2 ml LV SV MOD A4C:     110.0 ml LV SV MOD BP:      76.8 ml RIGHT VENTRICLE             IVC RV Basal diam:  3.90 cm     IVC diam: 2.10 cm RV S prime:     11.10 cm/s TAPSE (M-mode): 1.7 cm LEFT ATRIUM             Index        RIGHT ATRIUM           Index LA diam:        3.80 cm 2.53 cm/m   RA Area:     12.70 cm LA Vol (A2C):   62.6 ml 41.62 ml/m  RA Volume:   26.30 ml  17.49 ml/m LA Vol (A4C):   81.0 ml 53.85 ml/m LA Biplane Vol: 75.6 ml 50.26 ml/m  AORTIC VALVE LVOT Vmax:   96.00 cm/s LVOT Vmean:  60.100 cm/s LVOT VTI:    0.225 m AI PHT:      763 msec  AORTA Ao Root diam: 3.00 cm Ao Asc diam:  2.70 cm MITRAL VALVE                  TRICUSPID VALVE MV Area (PHT): 3.03 cm       TR Peak grad:   36.7  mmHg MV Decel Time: 250 msec       TR Vmax:        303.00 cm/s MR Peak grad:    159.3 mmHg MR Mean grad:    99.0 mmHg    SHUNTS MR Vmax:         631.00 cm/s  Systemic VTI:  0.22 m MR Vmean:        469.0 cm/s   Systemic Diam: 1.90 cm MR PISA:         1.57 cm MR PISA Eff ROA: 8 mm MR PISA Radius:  0.50 cm MV E velocity: 73.20 cm/s MV A velocity: 51.90 cm/s MV E/A ratio:  1.41 Arvilla Meres  MD Electronically signed by Arvilla Meres MD Signature Date/Time: 03/01/2023/9:39:08 AM    Final    DG Shoulder Left  Result Date: 03/01/2023 CLINICAL DATA:  Left shoulder pain.  No known injury EXAM: LEFT SHOULDER - 2 VIEW COMPARISON:  None Available. FINDINGS: There is no evidence of fracture or dislocation. Mild superior subluxation of the humeral head relative to the glenoid. Degenerative changes of the acromioclavicular joint. Soft tissues are unremarkable. IMPRESSION: 1. Mild superior subluxation of the humeral head relative to the glenoid, which can be seen with rotator cuff pathology. Degenerative changes of the acromioclavicular joint. 2.  No acute fracture or dislocation. Electronically Signed   By: Agustin Cree M.D.   On: 03/01/2023 08:17   DG Chest 2 View  Result Date: 02/28/2023 CLINICAL DATA:  Left shoulder pain. EXAM: CHEST - 2 VIEW COMPARISON:  Radiograph 08/24/2022 FINDINGS: Stable heart size allowing for AP technique. Unchanged mediastinal contours. Linear subsegmental opacity in the left upper and lower lobes typical of atelectasis. No pneumothorax or pleural effusion. Normal pulmonary vasculature. Thoracic spondylosis with anterior spurring. There is mild acromioclavicular degenerative change of both shoulders. IMPRESSION: Linear subsegmental atelectasis in the left upper and lower lobes. Electronically Signed   By: Narda Rutherford M.D.   On: 02/28/2023 15:10    Assessment & Plan:   Problem List Items Addressed This Visit     DM2 (diabetes mellitus, type 2) (HCC) - Primary    Chronic On Metformin      Relevant Orders   Comprehensive metabolic panel   CBC with Differential/Platelet   Hemoglobin A1c   TSH   Essential hypertension    Spironolactone 3/wk, Losartan 1/2 tab a day - low BP BP Readings from Last 3 Encounters:  11/13/23 128/78  10/17/23 130/72  09/11/23 130/70         Relevant Orders   Comprehensive metabolic panel   CBC with Differential/Platelet    Hemoglobin A1c   TSH   RESOLVED: Weight loss    Resolved      Stage 3b chronic kidney disease (CKD) (HCC)    Hydrate well      Relevant Orders   Comprehensive metabolic panel   CBC with Differential/Platelet   Hemoglobin A1c   TSH   Maureen Ralphs syndrome    2 episodes total so far. Doing well         No orders of the defined types were placed in this encounter.     Follow-up: Return in about 3 months (around 02/13/2024) for a follow-up visit.  Sonda Primes, MD

## 2023-11-14 ENCOUNTER — Other Ambulatory Visit: Payer: Self-pay | Admitting: Internal Medicine

## 2023-11-20 DIAGNOSIS — H401133 Primary open-angle glaucoma, bilateral, severe stage: Secondary | ICD-10-CM | POA: Diagnosis not present

## 2023-11-26 ENCOUNTER — Ambulatory Visit: Payer: Medicare PPO | Admitting: Licensed Clinical Social Worker

## 2023-11-26 DIAGNOSIS — F32A Depression, unspecified: Secondary | ICD-10-CM | POA: Diagnosis not present

## 2023-11-26 DIAGNOSIS — H5316 Psychophysical visual disturbances: Secondary | ICD-10-CM | POA: Diagnosis not present

## 2023-11-26 NOTE — Progress Notes (Addendum)
[facility] Counselor Initial Adult Exam  Name: Breanna [name] Date: 11/26/2023 MRN: [id number] DOB: August 25, 1931 PCP: Tresa Garter, MD  Time Spent: 11:07  am - 12:13 pm : 66 Minutes  Guardian/Payee:  Self/Adult    Paperwork requested: No   Reason for Visit /Presenting Problem: Seeing forms/individuals but no one else saw them but patient, they were quiet figures but no faces were visible-dark heads and body forms  Mental Status Exam: Appearance:   Well Groomed     Behavior:  Sharing  Motor:  Normal  Speech/Language:   Slow  Affect:  Appropriate  Mood:  normal  Thought process:  flight of ideas  Thought content:    Hallucinations: Visual and Tangential  Sensory/Perceptual disturbances:    Flashback  Orientation:  oriented to person, place, and time/date  Attention:  Good  Concentration:  Good  Memory:  WNL  Fund of knowledge:   Fair  Insight:    Good  Judgment:   Good  Impulse Control:  Good   Reported Symptoms:  Hallucinations, loss of sight Sept 2023  Risk Assessment: Danger to Self:  No Self-injurious Behavior: No Danger to Others: No Duty to Warn:no Physical Aggression / Violence:No  Access to Firearms a concern: No  Gang Involvement:No  Patient / guardian was educated about steps to take if suicide or homicide risk level increases between visits: yes While future psychiatric events cannot be accurately predicted, the patient does not currently require acute inpatient psychiatric care and does not currently meet [facility] involuntary commitment criteria.  Substance Abuse History: Current substance abuse: No     Caffeine: Tobacco: Alcohol: Substance use:  Past Psychiatric History:   No previous psychological problems have been observed Outpatient Providers:N/A History of Psych Hospitalization: No  Psychological Testing:  N/A    Abuse History:  Victim of: No.,  N/A    Report needed: No. Victim of Neglect:No. Perpetrator of   N/A   Witness / Exposure to Domestic Violence: No   Protective Services Involvement: No  Witness to [employer] Violence:  No   Family History:  Family History  Problem Relation Age of Onset   Prostate cancer Father    Hypertension Father    Cancer Father        prostate   Heart disease Mother    Diabetes Mother    Diabetes Other        1st degree relative   Heart disease Other    Hypertension Other    Prostate cancer Maternal Uncle    Cancer Maternal Uncle        prostate   Breast cancer Daughter    Cancer Daughter        breast   Colon cancer Neg Hx     Living situation: the patient lives with an adult companion  Sexual Orientation: Straight  Relationship Status: widowed  Name of spouse / other:N/A If a parent, number of children / ages:Adults  Support Systems: Friends, Social worker, and Environmental health practitioner Stress:  No   Income/Employment/Disability: Neurosurgeon: No   Educational History: Education: high school diploma/GED  Religion/Sprituality/World View: Baptist  Any cultural differences that may affect / interfere with treatment:  N/A  Recreation/Hobbies: Shopping  Stressors: Health problems    Strengths: Supportive Relationships, Family, Friends, Warehouse manager, and Spirituality  Barriers:  None   Legal History: Pending legal issue / charges: The patient has no significant history of legal issues. History of legal issue / charges:  N/A  Medical History/Surgical History: not reviewed Past Medical History:  Diagnosis Date   Adrenal adenoma 2006   Boils 2009   Cholelithiasis    asympt. w/normal HIDA 03/2010   CVA (cerebral infarction) 2010   Cerebellar   Diverticulitis    Esophageal spasm 2011   GERD (gastroesophageal reflux disease)    Gout    History of colon polyps    HTN (hypertension)    Hydronephrosis    LEFT/ Surgical intervention   Hyperlipidemia    LBP (low back pain)    Osteoarthritis    Pulmonary HTN  (HCC)    Stress    Type II or unspecified type diabetes mellitus without mention of complication, not stated as uncontrolled     Past Surgical History:  Procedure Laterality Date   ABDOMINAL HYSTERECTOMY     APPENDECTOMY     2020   BACK SURGERY     BREAST BIOPSY     RIGHT   CATARACT EXTRACTION, BILATERAL     COLECTOMY  2006   Sigmoid   FOOT SURGERY     BILATERAL   HAMMER TOE SURGERY     INTRAMEDULLARY (IM) NAIL INTERTROCHANTERIC Right 07/21/2020   Procedure: INTRAMEDULLARY (IM) NAIL INTERTROCHANTRIC;  Surgeon: Sheral Apley, MD;  Location: [facility] OR;  Service: Orthopedics;  Laterality: Right;   ROTATOR CUFF REPAIR  2008   RIGHT   TOTAL KNEE ARTHROPLASTY  2003   LEFT   VARICOSE VEIN SURGERY     vein stripping/lower extremities    Medications: Current Outpatient Medications  Medication Sig Dispense Refill   acetaminophen (TYLENOL) 325 MG tablet Take 2 tablets (650 mg total) by mouth every 6 (six) hours as needed for moderate pain or fever.     Alcohol Swabs (TRUE COMFORT ALCOHOL PREP PADS) 70 % PADS Use as directed 300 each 1   allopurinol (ZYLOPRIM) 100 MG tablet Take 0.5 tablets (50 mg total) by mouth daily. 90 tablet 1   amLODipine (NORVASC) 5 MG tablet Take 1 tablet (5 mg total) by mouth daily. 90 tablet 3   ascorbic acid (VITAMIN C) 500 MG tablet TAKE 1 TABLET BY MOUTH EVERY DAY 30 tablet 11   ASPIRIN LOW DOSE 81 MG tablet TAKE 1 TABLET BY MOUTH DAILY. SWALLOW WHOLE. 90 tablet 3   atropine 1 % ophthalmic solution Place 1 drop into both eyes daily.     Blood Glucose Calibration (TRUE METRIX LEVEL 1) Low SOLN USE AS DIRECTED 3 each 3   brimonidine (ALPHAGAN) 0.2 % ophthalmic solution Place 1 drop into the right eye 2 (two) times daily.     calcium carbonate (OSCAL) 1500 (600 Ca) MG TABS tablet TAKE 1 TABLET BY MOUTH EVERY DAY 90 tablet 3   Cholecalciferol (VITAMIN D3) 25 MCG (1000 UT) CAPS Take 1 capsule (1,000 Units total) by mouth daily. 90 capsule 3   [employer] VITAMIN B12  1000 MCG tablet TAKE 1 TABLET (1,000 MCG TOTAL) BY MOUTH EVERY DAY 90 tablet 3   diclofenac sodium (VOLTAREN) 1 % GEL Apply 2 g topically 3 (three) times daily. To left shoulder 200 g 3   docusate sodium (COLACE) 100 MG capsule TAKE 1 CAPSULE BY MOUTH TWICE A DAY 180 capsule 3   dorzolamide-timolol (COSOPT) 22.3-6.8 MG/ML ophthalmic solution Place 1 drop into both eyes 2 (two) times daily.      feeding supplement, ENSURE ENLIVE, (ENSURE ENLIVE) LIQD Take 237 mLs by mouth daily at 2 PM. 237 mL 12   gabapentin (NEURONTIN) 100  MG capsule Take 1 capsule (100 mg total) by mouth 2 (two) times daily. 180 capsule 5   glucose blood (GNP TRUE METRIX GLUCOSE STRIPS) test strip Use to check blood sugars twice a day 200 each 2   latanoprost (XALATAN) 0.005 % ophthalmic solution Place 1 drop into both eyes at bedtime.      lidocaine (LIDODERM) 5 % Place 1 patch onto the skin daily. Remove & Discard patch within 12 hours or as directed by MD. Apply to left shoulder 30 patch 0   lidocaine-prilocaine (EMLA) cream Apply 1 application topically as needed. 30 g 0   losartan (COZAAR) 100 MG tablet Take 0.5 tablets (50 mg total) by mouth daily.     metFORMIN (GLUCOPHAGE) 500 MG tablet TAKE 1 TABLET BY MOUTH EVERY DAY WITH BREAKFAST 90 tablet 1   mupirocin ointment (BACTROBAN) 2 % On leg wound w/dressing change qd or bid 30 g 0   Netarsudil-Latanoprost 0.02-0.005 % SOLN Apply to eye.     NON FORMULARY Place 10 drops under the tongue daily as needed (Pain). Medication: CBD oil     pantoprazole (PROTONIX) 40 MG tablet TAKE 1 TABLET BY MOUTH EVERY DAY 90 tablet 3   polyethylene glycol (MIRALAX / GLYCOLAX) 17 g packet Take 17 g by mouth daily as needed for mild constipation. 14 each 0   senna (SENOKOT) 8.6 MG TABS tablet Take 1 tablet (8.6 mg total) by mouth 2 (two) times daily. 120 tablet 0   sertraline (ZOLOFT) 100 MG tablet TAKE 1 TABLET BY MOUTH EVERY DAY 90 tablet 1   spironolactone (ALDACTONE) 25 MG tablet TAKE 1  TABLET BY MOUTH ON MONDAYS, WEDNESDAYS AND FRIDAYS 36 tablet 4   traMADol (ULTRAM) 50 MG tablet Take 1 tablet (50 mg total) by mouth every 8 (eight) hours as needed for severe pain. 30 tablet 0   TRUEplus Lancets 33G MISC USE AS DIRECTED TO CHECK BLOOD SUGAR TWICE A DAY 200 each 1   valACYclovir (VALTREX) 500 MG tablet Take 1 tablet (500 mg total) by mouth daily as needed (for outbreaks).     No current facility-administered medications for this visit.   Facility-Administered Medications Ordered in Other Visits  Medication Dose Route Frequency Provider Last Rate Last Admin   regadenoson (LEXISCAN) injection SOLN 0.4 mg  0.4 mg Intravenous Once Meriam Sprague, MD       technetium tetrofosmin (TC-MYOVIEW) injection 31.1 millicurie  31.1 millicurie Intravenous Once PRN Meriam Sprague, MD        Allergies  Allergen Reactions   Verapamil Shortness Of Breath    REACTION: SOB   Aspirin Itching    But tolerates low dose   Atenolol     REACTION: fatigue   Codeine Itching   Codeine Sulfate    Hydrochlorothiazide     REACTION: gout   Hydrocodone       side effects - hallucinations   Ibuprofen     Upset stomach w/high doses   Lisinopril     REACTION: cough   Onion Other (See Comments)    Dry mouth/ gets sores   Shellfish Allergy Swelling    Patient stated she does not eat shellfish, she swells up on different parts of the body   Statins     Dizzy, falls   Valsartan Itching   Adhesive  [Tape] Rash   Other Rash    Diagnoses:  Maureen Ralphs syndrome, Depression Psychiatric Treatment: No , N/A  Plan of Care: Patient would benefit from  psych evaluation to address the hallucinations.    Narrative:  Steele Berg participated from office with therapist and consented to treatment. We reviewed the limits of confidentiality prior to the start of the evaluation. [employer] expressed understanding and agreement to proceed. Patient was very chatting and forthcoming with  recent events around Thanksgiving holiday with family recently.  Recalls events currently but then will address historical spaces within the last year of losing sight and prior as well.  As the session went on, the storytelling did not seem clear and in order but fragmented.  It became difficult to follow the train of thought to guide the conversation. Patient reported not allowing things to cause stress because of their faith in God.  Having visions and hallucinations of dark figures with no faces approximately 4-5 times within the past few months.  Was not able to recall a trigger point.  Family members have been chipping in the provide support to manage her care.      A follow-up was scheduled to create a treatment plan and begin treatment. Therapist answered  and all questions during the evaluation and contact information was provided.     Anselmo Pickler, [facility]

## 2023-12-06 ENCOUNTER — Ambulatory Visit (INDEPENDENT_AMBULATORY_CARE_PROVIDER_SITE_OTHER): Payer: Medicare PPO

## 2023-12-06 ENCOUNTER — Ambulatory Visit
Admission: RE | Admit: 2023-12-06 | Discharge: 2023-12-06 | Disposition: A | Discharge status: Home / Self Care | Payer: Medicare PPO | Source: Ambulatory Visit | Attending: Internal Medicine | Admitting: Internal Medicine

## 2023-12-06 VITALS — Ht 62.0 in | Wt 113.0 lb

## 2023-12-06 DIAGNOSIS — Z1231 Encounter for screening mammogram for malignant neoplasm of breast: Secondary | ICD-10-CM

## 2023-12-06 DIAGNOSIS — Z0000 Encounter for general adult medical examination without abnormal findings: Secondary | ICD-10-CM | POA: Diagnosis not present

## 2023-12-06 NOTE — Patient Instructions (Signed)
Breanna [name] , Thank you for taking time to come for your Medicare Wellness Visit. I appreciate your ongoing commitment to your health goals. Please review the following plan we discussed and let me know if I can assist you in the future.   Referrals/Orders/Follow-Ups/Clinician Recommendations: It was a pleasure talking with you today.  Keep up the good work.  Happy Holidays to you and your family.  This is a list of the screening recommended for you and due dates:  Health Maintenance  Topic Date Due   COVID-19 Vaccine (6 - 2024-25 season) 12/22/2023*   Eye exam for diabetics  02/12/2024   Hemoglobin A1C  03/10/2024   Complete foot exam   04/18/2024   Medicare Annual Wellness Visit  12/05/2024   DTaP/Tdap/Td vaccine (4 - Td or Tdap) 05/13/2028   Pneumonia Vaccine  Completed   Flu Shot  Completed   DEXA scan (bone density measurement)  Completed   HPV Vaccine  Aged Out   Zoster (Shingles) Vaccine  Discontinued  *Topic was postponed. The date shown is not the original due date.    Advanced directives: (Copy Requested) Please bring a copy of your health care power of attorney and living will to the office to be added to your chart at your convenience.  Next Medicare Annual Wellness Visit scheduled for next year: Yes

## 2023-12-06 NOTE — Progress Notes (Signed)
Subjective:   Breanna [name] is a 89 y.o. female who presents for Medicare Annual (Subsequent) preventive examination.  Visit Complete: Virtual I connected with  Breanna [name] on 12/06/23 by a audio enabled telemedicine application and verified that I am speaking with the correct person using two identifiers.  Patient Location: Home  Provider Location: Office/Clinic  I discussed the limitations of evaluation and management by telemedicine. The patient expressed understanding and agreed to proceed.  Vital Signs: Because this visit was a virtual/telehealth visit, some criteria may be missing or patient reported. Any vitals not documented were not able to be obtained and vitals that have been documented are patient reported.   Cardiac Risk Factors include: advanced age (>63men, >12 women);Other (see comment);diabetes mellitus;dyslipidemia;hypertension, Risk factor comments: Atherosclerosis of aorta, Ostoperosis. CKD     Objective:    Today's Vitals   12/06/23 1136  Weight: 113 lb (51.3 kg)  Height: 5\' 2"  (1.575 m)   Body mass index is 20.67 kg/m.     12/06/2023   11:47 AM 02/28/2023   11:45 PM 02/28/2023    1:01 PM 11/05/2022    1:19 PM 03/09/2022    9:40 AM 11/03/2021    9:32 AM 07/21/2020    4:15 AM  Advanced Directives  Does Patient Have a Medical Advance Directive? Yes Yes Yes Yes Yes No No  Type of Estate agent of [place];Living will  Living will Healthcare Power of [place];Living will Healthcare Power of [place];Living will    Does patient want to make changes to medical advance directive?  No - Patient declined   No - Patient declined    Copy of Healthcare Power of Attorney in Chart? No - copy requested   No - copy requested     Would patient like information on creating a medical advance directive?      No - Patient declined No - Patient declined    Current Medications (verified) Outpatient Encounter Medications as of 12/06/2023  Medication  Sig   acetaminophen (TYLENOL) 325 MG tablet Take 2 tablets (650 mg total) by mouth every 6 (six) hours as needed for moderate pain or fever.   Alcohol Swabs (TRUE COMFORT ALCOHOL PREP PADS) 70 % PADS Use as directed   allopurinol (ZYLOPRIM) 100 MG tablet Take 0.5 tablets (50 mg total) by mouth daily.   amLODipine (NORVASC) 5 MG tablet Take 1 tablet (5 mg total) by mouth daily.   ascorbic acid (VITAMIN C) 500 MG tablet TAKE 1 TABLET BY MOUTH EVERY DAY   ASPIRIN LOW DOSE 81 MG tablet TAKE 1 TABLET BY MOUTH DAILY. SWALLOW WHOLE.   atropine 1 % ophthalmic solution Place 1 drop into both eyes daily.   Blood Glucose Calibration (TRUE METRIX LEVEL 1) Low SOLN USE AS DIRECTED   brimonidine (ALPHAGAN) 0.2 % ophthalmic solution Place 1 drop into the right eye 2 (two) times daily.   calcium carbonate (OSCAL) 1500 (600 Ca) MG TABS tablet TAKE 1 TABLET BY MOUTH EVERY DAY   Cholecalciferol (VITAMIN D3) 25 MCG (1000 UT) CAPS Take 1 capsule (1,000 Units total) by mouth daily.   [facility] VITAMIN B12 1000 MCG tablet TAKE 1 TABLET (1,000 MCG TOTAL) BY MOUTH EVERY DAY   diclofenac sodium (VOLTAREN) 1 % GEL Apply 2 g topically 3 (three) times daily. To left shoulder   docusate sodium (COLACE) 100 MG capsule TAKE 1 CAPSULE BY MOUTH TWICE A DAY   dorzolamide-timolol (COSOPT) 22.3-6.8 MG/ML ophthalmic solution Place 1 drop into both  eyes 2 (two) times daily.    feeding supplement, ENSURE ENLIVE, (ENSURE ENLIVE) LIQD Take 237 mLs by mouth daily at 2 PM.   gabapentin (NEURONTIN) 100 MG capsule Take 1 capsule (100 mg total) by mouth 2 (two) times daily.   glucose blood (GNP TRUE METRIX GLUCOSE STRIPS) test strip Use to check blood sugars twice a day   latanoprost (XALATAN) 0.005 % ophthalmic solution Place 1 drop into both eyes at bedtime.    lidocaine (LIDODERM) 5 % Place 1 patch onto the skin daily. Remove & Discard patch within 12 hours or as directed by MD. Apply to left shoulder   lidocaine-prilocaine (EMLA) cream Apply 1  application topically as needed.   losartan (COZAAR) 100 MG tablet Take 0.5 tablets (50 mg total) by mouth daily.   metFORMIN (GLUCOPHAGE) 500 MG tablet TAKE 1 TABLET BY MOUTH EVERY DAY WITH BREAKFAST   mupirocin ointment (BACTROBAN) 2 % On leg wound w/dressing change qd or bid   Netarsudil-Latanoprost 0.02-0.005 % SOLN Apply to eye.   NON FORMULARY Place 10 drops under the tongue daily as needed (Pain). Medication: CBD oil   pantoprazole (PROTONIX) 40 MG tablet TAKE 1 TABLET BY MOUTH EVERY DAY   polyethylene glycol (MIRALAX / GLYCOLAX) 17 g packet Take 17 g by mouth daily as needed for mild constipation.   senna (SENOKOT) 8.6 MG TABS tablet Take 1 tablet (8.6 mg total) by mouth 2 (two) times daily.   sertraline (ZOLOFT) 100 MG tablet TAKE 1 TABLET BY MOUTH EVERY DAY   spironolactone (ALDACTONE) 25 MG tablet TAKE 1 TABLET BY MOUTH ON MONDAYS, WEDNESDAYS AND FRIDAYS   traMADol (ULTRAM) 50 MG tablet Take 1 tablet (50 mg total) by mouth every 8 (eight) hours as needed for severe pain.   TRUEplus Lancets 33G MISC USE AS DIRECTED TO CHECK BLOOD SUGAR TWICE A DAY   valACYclovir (VALTREX) 500 MG tablet Take 1 tablet (500 mg total) by mouth daily as needed (for outbreaks).   Facility-Administered Encounter Medications as of 12/06/2023  Medication   regadenoson (LEXISCAN) injection SOLN 0.4 mg   technetium tetrofosmin (TC-MYOVIEW) injection 31.1 millicurie    Allergies (verified) Verapamil, Aspirin, Atenolol, Codeine, Codeine sulfate, Hydrochlorothiazide, Hydrocodone, Ibuprofen, Lisinopril, Onion, Shellfish allergy, Statins, Valsartan, Adhesive  [tape], and Other   History: Past Medical History:  Diagnosis Date   Adrenal adenoma 2006   Boils 2009   Cholelithiasis    asympt. w/normal HIDA 03/2010   CVA (cerebral infarction) 2010   Cerebellar   Diverticulitis    Esophageal spasm 2011   GERD (gastroesophageal reflux disease)    Gout    History of colon polyps    HTN (hypertension)     Hydronephrosis    LEFT/ Surgical intervention   Hyperlipidemia    LBP (low back pain)    Osteoarthritis    Pulmonary HTN (HCC)    Stress    Type II or unspecified type diabetes mellitus without mention of complication, not stated as uncontrolled    Past Surgical History:  Procedure Laterality Date   ABDOMINAL HYSTERECTOMY     APPENDECTOMY     2020   BACK SURGERY     BREAST BIOPSY     RIGHT   CATARACT EXTRACTION, BILATERAL     COLECTOMY  2006   Sigmoid   FOOT SURGERY     BILATERAL   HAMMER TOE SURGERY     INTRAMEDULLARY (IM) NAIL INTERTROCHANTERIC Right 07/21/2020   Procedure: INTRAMEDULLARY (IM) NAIL INTERTROCHANTRIC;  Surgeon: Margarita Rana  D, MD;  Location: [facility] OR;  Service: Orthopedics;  Laterality: Right;   ROTATOR CUFF REPAIR  2008   RIGHT   TOTAL KNEE ARTHROPLASTY  2003   LEFT   VARICOSE VEIN SURGERY     vein stripping/lower extremities   Family History  Problem Relation Age of Onset   Prostate cancer Father    Hypertension Father    Cancer Father        prostate   Heart disease Mother    Diabetes Mother    Diabetes Other        1st degree relative   Heart disease Other    Hypertension Other    Prostate cancer Maternal Uncle    Cancer Maternal Uncle        prostate   Breast cancer Daughter    Cancer Daughter        breast   Colon cancer Neg Hx    Social History   Socioeconomic History   Marital status: Widowed    Spouse name: Gabriele Shubin   Number of children: 1   Years of education: Not on file   Highest education level: Not on file  Occupational History   Occupation: Retired    Associate Professor: RETIRED  Tobacco Use   Smoking status: Never   Smokeless tobacco: Never  Vaping Use   Vaping status: Never Used  Substance and Sexual Activity   Alcohol use: No    Alcohol/week: 0.0 standard drinks of alcohol   Drug use: No   Sexual activity: Not Currently  Other Topics Concern   Not on file  Social History Narrative   Lives with son.   Social  Drivers of Corporate investment banker Strain: Low Risk  (12/06/2023)   Overall Financial Resource Strain (CARDIA)    Difficulty of Paying Living Expenses: Not hard at all  Food Insecurity: No Food Insecurity (12/06/2023)   Hunger Vital Sign    Worried About Running Out of Food in the Last Year: Never true    Ran Out of Food in the Last Year: Never true  Transportation Needs: No Transportation Needs (12/06/2023)   PRAPARE - Administrator, Civil Service (Medical): No    Lack of Transportation (Non-Medical): No  Physical Activity: Inactive (12/06/2023)   Exercise Vital Sign    Days of Exercise per Week: 0 days    Minutes of Exercise per Session: 0 min  Stress: No Stress Concern Present (12/06/2023)   [employer] of Occupational Health - Occupational Stress Questionnaire    Feeling of Stress : Not at all  Social Connections: Moderately Integrated (12/06/2023)   Social Connection and Isolation Panel [NHANES]    Frequency of Communication with Friends and Family: More than three times a week    Frequency of Social Gatherings with Friends and Family: Three times a week    Attends Religious Services: More than 4 times per year    Active Member of Clubs or Organizations: Yes    Attends Banker Meetings: Never    Marital Status: Widowed    Tobacco Counseling Counseling given: Not Answered   Clinical Intake:  Pre-visit preparation completed: Yes  Pain : No/denies pain     BMI - recorded: 20.67 Nutritional Status: BMI of 19-24  Normal Nutritional Risks: None Diabetes: Yes CBG done?: No Did pt. bring in CBG monitor from home?: No  How often do you need to have someone help you when you read instructions, pamphlets, or other written materials  from your doctor or pharmacy?: 5 - Always  Interpreter Needed?: No  Information entered by :: Breanna [name], RMA   Activities of Daily Living    12/06/2023   11:37 AM 02/28/2023   11:43 PM  In your  present state of health, do you have any difficulty performing the following activities:  Hearing? 0 0  Vision? 1 1  Comment per pt- can not see very well.   Difficulty concentrating or making decisions? 0 0  Walking or climbing stairs? 0 1  Dressing or bathing? 1 0  Doing errands, shopping? 0 0  Comment [place] stransportation but she does not drive Architectural technologist and eating ? N   Using the Toilet? N   In the past six months, have you accidently leaked urine? N   Do you have problems with loss of bowel control? Y   Comment due to food   Managing your Medications? N   Managing your Finances? N   Housekeeping or managing your Housekeeping? N     Patient Care Team: Plotnikov, Georgina Quint, MD as PCP - General (Internal Medicine) Jethro Bolus, MD (Inactive) as Consulting Physician (Urology) Venita Lick, MD as Consulting Physician (Orthopedic Surgery) Nelida Gores, MD as Referring Physician (Ophthalmology) Holli Humbles, MD as Referring Physician (Ophthalmology) Freddie Breech, DPM as Consulting Physician (Podiatry) Christell Constant, MD as Consulting Physician (Cardiology) Gwyndolyn Saxon, LCSW as Social Worker (Licensed Clinical Social Worker)  Indicate any recent [employer] you may have received from other than [facility] providers in the past year (date may be approximate).     Assessment:   This is a routine wellness examination for Breanna [name].  Hearing/Vision screen Hearing Screening - Comments:: Denies hearing difficulties   Vision Screening - Comments:: Can not see very well.   Goals Addressed               This Visit's Progress     Patient Stated (pt-stated)        To be able to fully address herself, and read. To stay healthy      Depression Screen    12/06/2023   11:57 AM 09/11/2023    8:23 AM 05/28/2023    2:37 PM 04/11/2023    8:31 AM 02/25/2023    4:08 PM 02/13/2023    3:16 PM 01/23/2023    2:31 PM  PHQ 2/9 Scores  PHQ - 2  Score 1 0 0 0 0 0 0  PHQ- 9 Score 2  0 0  0 0    Fall Risk    12/06/2023   11:49 AM 11/13/2023   11:42 AM 09/11/2023    8:23 AM 05/28/2023    2:36 PM 04/11/2023    8:30 AM  Fall Risk   Falls in the past year? 1 0 1 1 1   Number falls in past yr: 1 0 1 1 1   Injury with Fall? 1 0 1 1 0  Comment    bruises   Risk for fall due to :  No Fall Risks Impaired balance/gait;Impaired mobility Other (Comment) Medication side effect  Follow up Falls evaluation completed;Falls prevention discussed Falls evaluation completed Falls evaluation completed Falls evaluation completed Falls evaluation completed    MEDICARE RISK AT HOME: Medicare Risk at Home Any stairs in or around the home?: Yes (outside front and back) If so, are there any without handrails?: Yes Home free of loose throw rugs in walkways, pet beds, electrical cords, etc?: Yes Adequate lighting in  your home to reduce risk of falls?: Yes Life alert?: No Use of a cane, walker or w/c?: Yes Grab bars in the bathroom?: Yes Shower chair or bench in shower?: Yes Elevated toilet seat or a handicapped toilet?: Yes  TIMED UP AND GO:  Was the test performed?  No    Cognitive Function:    07/07/2018    8:59 AM 07/04/2017   11:52 AM 05/30/2015    9:20 AM  MMSE - Mini Mental State Exam  Not completed:   Unable to complete  Orientation to time 5 5   Orientation to Place 5 5   Registration 3 3   Attention/ Calculation 4 4   Recall 1 2   Language- name 2 objects 2 2   Language- repeat 1 1   Language- follow 3 step command 3 3   Language- read & follow direction 1 1   Write a sentence 1 1   Copy design 1 1   Total score 27 28         12/06/2023   11:50 AM 11/05/2022    1:22 PM  6CIT Screen  What Year? 0 points 0 points  What month? 0 points 0 points  What time? 0 points 0 points  Count back from 20 0 points 0 points  Months in reverse 0 points 0 points  Repeat phrase 0 points 0 points  Total Score 0 points 0 points     Immunizations Immunization History  Administered Date(s) Administered   Fluad Quad(high Dose 65+) 09/16/2019, 10/20/2020   Fluad Trivalent(High Dose 65+) 09/11/2023   H1N1 01/06/2009   Influenza Split 10/01/2011, 09/08/2012   Influenza Whole 10/07/2009, 09/29/2010   Influenza, High Dose Seasonal PF 10/06/2013, 11/21/2016, 09/25/2017, 09/22/2018, 11/08/2021, 11/07/2022   Influenza-Unspecified 10/09/2014, 09/09/2015   Moderna Covid-19 Vaccine Bivalent Booster 31yrs & up 11/14/2021   PFIZER(Purple Top)SARS-COV-2 Vaccination 01/12/2020, 02/02/2020, 09/26/2020, 02/08/2023   Pneumococcal Conjugate-13 04/07/2014   Pneumococcal Polysaccharide-23 06/26/2007, 03/14/2018   Td 06/26/2007   Td (Adult), 2 Lf Tetanus Toxid, Preservative Free 06/26/2007   Tdap 05/13/2018   Zoster, Live 10/04/2011    TDAP status: Up to date  Flu Vaccine status: Up to date  Pneumococcal vaccine status: Up to date  Covid-19 vaccine status: Completed vaccines  Qualifies for Shingles Vaccine? Yes   Zostavax completed Yes   Shingrix Completed?: Yes  Screening Tests Health Maintenance  Topic Date Due   COVID-19 Vaccine (6 - 2024-25 season) 12/22/2023 (Originally 08/25/2023)   OPHTHALMOLOGY EXAM  02/12/2024   HEMOGLOBIN A1C  03/10/2024   FOOT EXAM  04/18/2024   Medicare Annual Wellness (AWV)  12/05/2024   DTaP/Tdap/Td (4 - Td or Tdap) 05/13/2028   Pneumonia Vaccine 65+ Years old  Completed   INFLUENZA VACCINE  Completed   DEXA SCAN  Completed   HPV VACCINES  Aged Out   Zoster Vaccines- Shingrix  Discontinued    Health Maintenance  There are no preventive care reminders to display for this patient.   Colorectal cancer screening: No longer required.   Mammogram status: No longer required due to age.  Bone Density status: Completed 06/15/2015. Results reflect: Bone density results: OSTEOPOROSIS. Repeat every 2 years.  Lung Cancer Screening: (Low Dose CT Chest recommended if Age 26-80 years, 20  pack-year currently smoking OR have quit w/in 15years.) does not qualify.   Lung Cancer Screening Referral: N/A  Additional Screening:  Hepatitis C Screening: does not qualify;   Vision Screening: Recommended annual ophthalmology exams for early detection  of glaucoma and other disorders of the eye. Is the patient up to date with their annual eye exam?  Yes  Who is the provider or what is the name of the office in which the patient attends annual eye exams? Dr. Lottie Dawson and Dr. Valere Dross If pt is not established with a provider, would they like to be referred to a provider to establish care? No .   Dental Screening: Recommended annual dental exams for proper oral hygiene  Diabetic Foot Exam: Diabetic Foot Exam: Completed 04/19/2023  Community Resource Referral / Chronic Care Management: CRR required this visit?  No   CCM required this visit?  No     Plan:     I have personally reviewed and noted the following in the patient's chart:   Medical and social history Use of alcohol, tobacco or illicit drugs  Current medications and supplements including opioid prescriptions. Patient is not currently taking opioid prescriptions. Functional ability and status Nutritional status Physical activity Advanced directives List of other physicians Hospitalizations, surgeries, and ER visits in previous 12 months Vitals Screenings to include cognitive, depression, and falls Referrals and appointments  In addition, I have reviewed and discussed with patient certain preventive protocols, quality metrics, and best practice recommendations. A written personalized care plan for preventive services as well as general preventive health recommendations were provided to patient.     Breanna [name], CMA   12/06/2023   After Visit Summary: (MyChart) Due to this being a telephonic visit, the after visit summary with patients personalized plan was offered to patient via MyChart   Nurse Notes: Patient  is up to date on all her health maintenance.  She has no concerns to address today.

## 2023-12-11 ENCOUNTER — Ambulatory Visit: Payer: Medicare PPO | Admitting: Licensed Clinical Social Worker

## 2023-12-11 ENCOUNTER — Ambulatory Visit: Payer: Medicare PPO | Admitting: Internal Medicine

## 2023-12-11 DIAGNOSIS — H5316 Psychophysical visual disturbances: Secondary | ICD-10-CM

## 2023-12-11 DIAGNOSIS — F32A Depression, unspecified: Secondary | ICD-10-CM

## 2023-12-11 NOTE — Progress Notes (Signed)
[facility] Counselor/Therapist Progress Note  Patient ID: Breanna [name], MRN: [id number]    Date: 12/11/23  Time Spent: 12:05  pm - 12:55 pm :   Treatment Type: Individual Therapy.  Reported Symptoms: increase in appetite, waking up and hard to get back to sleep  Mental Status Exam: Appearance:  Fairly Groomed     Behavior: Sharing  Motor: Normal  Speech/Language:  Normal Rate  Affect: Appropriate  Mood: normal  Thought process: normal, focused/storytelling  Thought content:   WNL  Sensory/Perceptual disturbances:   WNL  Orientation: oriented to person, place, and time/date  Attention: Good  Concentration: Good  Memory: WNL  Fund of knowledge:  Good  Insight:   Good  Judgment:  Good  Impulse Control: Good   Risk Assessment: Danger to Self:  No Self-injurious Behavior: No Danger to Others: No Duty to Warn:no Physical Aggression / Violence:No  Access to Firearms a concern: No  Gang Involvement:No   Subjective:   Breanna [name] participated in the session, in person in the office with the therapist, and consented to treatment Breanna [name] reviewed the events of the past week. She has had family coming in to stay with her in shifts.  She has been attending her doctors appointments.  She has been told that her hallucinations are stemming from aging and the vision loss.  The family is adjusting as well in the level of support they are providing while she wants to be respected.   We reviewed numerous treatment approaches including CBT and Solution focused therapy. Psych-education regarding the Breanna [name] diagnosis of CBS, and Depression was provided during the session. We discussed Breanna [name]'s goals treatment goals which include  setting goal of increasing self esteem by identifying challenges and combating with positive self talk, recognizing personal strengths, practicing gratitude.     Breanna [name] provided verbal approval of the treatment plan.     Interventions: Psycho-education & Goal Setting.   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 She said she is blessed, she is aware to be careful with the loss of site and aging but she must prepare herself to slow down and be intentional. She feels like she is in good hands.     Treatment Level Weekly  Symptoms  Increase in appetite   (Status: maintained) "Bullish" used by patient to describe her ability  to self advocate when family is making decisions on her behalf   (Status: maintained)  Goals:   Breanna [name] agreed to setting goal of increasing self esteem by identifying challenges and combating with positive self talk, recognizing personal strengths, practicing gratitude.    Treatment plan signed and available on s-drive:  Yes    Target Date: 01/23/2023 Frequency: Weekly  Progress: 0 Modality: individual    Therapist will provide referrals for additional resources as appropriate.  Therapist will provide psycho-education regarding Breanna [name]'s diagnosis and corresponding treatment approaches and interventions. Licensed Clinical Mental Health Counselor, Anselmo Pickler, [facility], will support the patient's ability to achieve the goals identified. will employ CBT, BA, Problem-solving, Solution Focused, Mindfulness,  coping skills, & other evidenced-based practices will be used to promote progress towards healthy functioning to help manage decrease symptoms associated with her diagnosis.   Reduce overall level, frequency, and intensity of the feelings of depression, due to the inability to do so  because of the aging process. She is not wallowing in depression but the disappointment and sadness/reality of limitations. Verbally express understanding of the relationship between feelings of depression, anxiety and their impact on thinking patterns and behaviors. Verbalize an understanding of the role that distorted thinking plays in creating fears, excessive worry, and ruminations.  Breanna [name] participated in the creation of the treatment plan)    Anselmo Pickler, [facility]

## 2023-12-30 ENCOUNTER — Encounter: Payer: Self-pay | Admitting: Podiatry

## 2023-12-30 ENCOUNTER — Ambulatory Visit (INDEPENDENT_AMBULATORY_CARE_PROVIDER_SITE_OTHER): Payer: Medicare PPO | Admitting: Podiatry

## 2023-12-30 DIAGNOSIS — B351 Tinea unguium: Secondary | ICD-10-CM

## 2023-12-30 DIAGNOSIS — E1151 Type 2 diabetes mellitus with diabetic peripheral angiopathy without gangrene: Secondary | ICD-10-CM | POA: Diagnosis not present

## 2023-12-30 DIAGNOSIS — M79675 Pain in left toe(s): Secondary | ICD-10-CM

## 2023-12-30 DIAGNOSIS — M79674 Pain in right toe(s): Secondary | ICD-10-CM

## 2023-12-30 DIAGNOSIS — L84 Corns and callosities: Secondary | ICD-10-CM | POA: Diagnosis not present

## 2023-12-30 NOTE — Progress Notes (Signed)
 Subjective:  Patient ID: Breanna [name], female    DOB: January 06, 1931,  MRN: [id number]  Breanna [name] presents to clinic today for:  Chief Complaint  Patient presents with   Debridement    Trim toenails/callus lateral heel right - diabetic - 6.8   Patient notes nails are thick and elongated, causing pain in shoe gear when ambulating.  She has a recurring callus on right lateral heel.  A family member/caregiver is with her today.    PCP is Plotnikov, Karlynn GAILS, MD.  Last seen 10/16/23.  Past Medical History:  Diagnosis Date   Adrenal adenoma 2006   Boils 2009   Cholelithiasis    asympt. w/normal HIDA 03/2010   CVA (cerebral infarction) 2010   Cerebellar   Diverticulitis    Esophageal spasm 2011   GERD (gastroesophageal reflux disease)    Gout    History of colon polyps    HTN (hypertension)    Hydronephrosis    LEFT/ Surgical intervention   Hyperlipidemia    LBP (low back pain)    Osteoarthritis    Pulmonary HTN (HCC)    Stress    Type II or unspecified type diabetes mellitus without mention of complication, not stated as uncontrolled     Allergies  Allergen Reactions   Verapamil Shortness Of Breath    REACTION: SOB   Aspirin  Itching    But tolerates low dose   Atenolol     REACTION: fatigue   Codeine Itching   Codeine Sulfate    Hydrochlorothiazide     REACTION: gout   Hydrocodone        side effects - hallucinations   Ibuprofen      Upset stomach w/high doses   Lisinopril     REACTION: cough   Onion Other (See Comments)    Dry mouth/ gets sores   Shellfish Allergy Swelling    Patient stated she does not eat shellfish, she swells up on different parts of the body   Statins     Dizzy, falls   Valsartan Itching   Adhesive  [Tape] Rash   Other Rash    Objective:  Breanna [name] is a pleasant 89 y.o. female in NAD. AAO x 3.  Vascular Examination: Patient has palpable DP pulse, absent PT pulse bilateral.  Delayed capillary refill bilateral toes.   Sparse digital hair bilateral.  Proximal to distal cooling WNL bilateral.    Dermatological Examination: Interspaces are clear with no open lesions noted bilateral.  Skin is shiny and atrophic bilateral.  Nails are 3-90mm thick, with yellowish/brown discoloration, subungual debris and distal onycholysis x10.  There is pain with compression of nails x10.  There are hyperkeratotic lesions noted on lateral right heel (present ever since she had hip surgery on that extremity) .     Latest Ref Rng & Units 09/11/2023    9:15 AM 02/13/2023    3:53 PM  Hemoglobin A1C  Hemoglobin-A1c 4.6 - 6.5 % 6.8  6.2    Patient qualifies for at-risk foot care because of diabetes with PVD .  Assessment/Plan: 1. Pain due to onychomycosis of toenails of both feet   2. Callus of foot   3. Type II diabetes mellitus with peripheral circulatory disorder (HCC)    Mycotic nails x10 were sharply debrided with sterile nail nippers and power debriding burr to decrease bulk and length.  Hyperkeratotic lesion on lateral right heel was shaved with #312 blade.   Return in about 3 months (around 03/29/2024)  for [facility].   Awanda CHARM Imperial, DPM, FACFAS [facility]     [address] [place], [place] [address]                Office [phone]  Fax [phone]

## 2023-12-31 ENCOUNTER — Ambulatory Visit (INDEPENDENT_AMBULATORY_CARE_PROVIDER_SITE_OTHER): Payer: Medicare PPO | Admitting: Licensed Clinical Social Worker

## 2023-12-31 DIAGNOSIS — H5316 Psychophysical visual disturbances: Secondary | ICD-10-CM

## 2023-12-31 NOTE — Progress Notes (Addendum)
 [facility] Counselor/Therapist Progress Note  Patient ID: Breanna [name], MRN: [id number]    Date: 12/31/23  Time Spent: 12:03  pm - 1:15 pm : 72 Minutes  Treatment Type: Individual Therapy.  Reported Symptoms: No negative symptoms at this time to report.    Mental Status Exam: Appearance:  Fairly Groomed     Behavior: Appropriate  Motor: Normal  Speech/Language:  Normal Rate  Affect: Appropriate  Mood: normal  Thought process: normal  Thought content:   WNL  Sensory/Perceptual disturbances:   WNL  Orientation: oriented to person, place, and time/date  Attention: Good  Concentration: Good  Memory: WNL  Fund of knowledge:  Good  Insight:   Good  Judgment:  Good  Impulse Control: Good   Risk Assessment: Danger to Self:  No Self-injurious Behavior: No Danger to Others: No Duty to Warn:no Physical Aggression / Violence:No  Access to Firearms a concern: No  Gang Involvement:No   Subjective:   [employer] participated from home, via video, and consented to treatment. I discussed the limitations of evaluation and management by telemedicine and the availability of in person appointments. The patient expressed understanding and agreed to proceed.  Therapist participated from home office.  Breanna [name] reviewed the events of the past week. Her grandchildren have been helping her explore music and old black and white tv programs that she enjoyed.       Interventions: Narrative  Diagnosis:  CBS-Charles Bonnet syndrome   Psychiatric Treatment: No , None  Treatment Plan:  Client Abilities/Strengths Breanna [name] is aware of her current limitations with the loss of her site.    Support System: Family, Church and Friends  Client Treatment Preferences Face to face-visually impaired  Theatre Manager of Needs Breanna [name] would like to explore self help audio books-managing emotions and self regulating, self improvements.   She wants to learn to be accustomed with her  blindness.  She enjoyed reading but now has to focus on audio books.    Treatment Level Weekly  Symptoms  Hopeful   (Status: improved) Optimistic/Independence   (Status: improved)  Goals:   Breanna [name] experiences symptoms of adjustment.  She does not have a why me attitude, no pity or sadness.  This does not feel like she is angry with GOD.  She desires to be patient with herself in order to be patient with others.     Target Date: 01/24/2024 Frequency: Weekly  Progress: 20 Modality: individual    Therapist will provide referrals for additional resources as appropriate.  Therapist will provide psycho-education regarding Breanna [name]'s diagnosis and corresponding treatment approaches and interventions. Licensed Clinical Mental Health Counselor, Tawni Louder, [facility] will support the patient's ability to achieve the goals identified. will employ CBT, BA, Problem-solving, Solution Focused, Mindfulness,  coping skills, & other evidenced-based practices will be used to promote progress towards healthy functioning to help manage decrease symptoms associated with her diagnosis.   Reduce overall level, frequency, and intensity of the feelings of depression, anxiety and panic evidenced by increased levels of patience and appreciation.  This compels her to be more patient because she needs constant support.   Verbally express understanding of the relationship between feelings of depression, anxiety and their impact on thinking patterns and behaviors. Verbalize an understanding of the role that distorted thinking plays in creating fears, excessive worry, and ruminations.  Breanna [name] participated in the creation of the treatment plan)   Tawni Louder, LCMHC

## 2024-01-17 ENCOUNTER — Other Ambulatory Visit: Payer: Self-pay

## 2024-01-17 ENCOUNTER — Emergency Department (HOSPITAL_COMMUNITY): Payer: Medicare PPO

## 2024-01-17 ENCOUNTER — Encounter (HOSPITAL_COMMUNITY): Payer: Self-pay | Admitting: Emergency Medicine

## 2024-01-17 ENCOUNTER — Inpatient Hospital Stay (HOSPITAL_COMMUNITY)
Admission: EM | Admit: 2024-01-17 | Discharge: 2024-01-28 | DRG: 378 | Disposition: A | Discharge status: Home Health | Payer: Medicare PPO | Attending: Family Medicine | Admitting: Family Medicine

## 2024-01-17 DIAGNOSIS — Z7982 Long term (current) use of aspirin: Secondary | ICD-10-CM | POA: Diagnosis not present

## 2024-01-17 DIAGNOSIS — D649 Anemia, unspecified: Secondary | ICD-10-CM | POA: Diagnosis not present

## 2024-01-17 DIAGNOSIS — E870 Hyperosmolality and hypernatremia: Secondary | ICD-10-CM | POA: Diagnosis not present

## 2024-01-17 DIAGNOSIS — N1832 Chronic kidney disease, stage 3b: Secondary | ICD-10-CM | POA: Diagnosis present

## 2024-01-17 DIAGNOSIS — E440 Moderate protein-calorie malnutrition: Secondary | ICD-10-CM | POA: Diagnosis present

## 2024-01-17 DIAGNOSIS — R195 Other fecal abnormalities: Secondary | ICD-10-CM

## 2024-01-17 DIAGNOSIS — I129 Hypertensive chronic kidney disease with stage 1 through stage 4 chronic kidney disease, or unspecified chronic kidney disease: Secondary | ICD-10-CM | POA: Diagnosis not present

## 2024-01-17 DIAGNOSIS — E861 Hypovolemia: Secondary | ICD-10-CM | POA: Diagnosis present

## 2024-01-17 DIAGNOSIS — Z885 Allergy status to narcotic agent status: Secondary | ICD-10-CM

## 2024-01-17 DIAGNOSIS — Z91018 Allergy to other foods: Secondary | ICD-10-CM

## 2024-01-17 DIAGNOSIS — R1110 Vomiting, unspecified: Secondary | ICD-10-CM | POA: Diagnosis not present

## 2024-01-17 DIAGNOSIS — K635 Polyp of colon: Secondary | ICD-10-CM | POA: Diagnosis present

## 2024-01-17 DIAGNOSIS — R231 Pallor: Secondary | ICD-10-CM | POA: Diagnosis not present

## 2024-01-17 DIAGNOSIS — K5730 Diverticulosis of large intestine without perforation or abscess without bleeding: Secondary | ICD-10-CM | POA: Diagnosis not present

## 2024-01-17 DIAGNOSIS — E1140 Type 2 diabetes mellitus with diabetic neuropathy, unspecified: Secondary | ICD-10-CM | POA: Diagnosis present

## 2024-01-17 DIAGNOSIS — Z888 Allergy status to other drugs, medicaments and biological substances status: Secondary | ICD-10-CM | POA: Diagnosis not present

## 2024-01-17 DIAGNOSIS — K91840 Postprocedural hemorrhage and hematoma of a digestive system organ or structure following a digestive system procedure: Secondary | ICD-10-CM | POA: Diagnosis not present

## 2024-01-17 DIAGNOSIS — Z681 Body mass index (BMI) 19 or less, adult: Secondary | ICD-10-CM | POA: Diagnosis not present

## 2024-01-17 DIAGNOSIS — E119 Type 2 diabetes mellitus without complications: Secondary | ICD-10-CM | POA: Diagnosis not present

## 2024-01-17 DIAGNOSIS — Z1152 Encounter for screening for COVID-19: Secondary | ICD-10-CM | POA: Diagnosis not present

## 2024-01-17 DIAGNOSIS — E876 Hypokalemia: Secondary | ICD-10-CM | POA: Diagnosis not present

## 2024-01-17 DIAGNOSIS — K922 Gastrointestinal hemorrhage, unspecified: Secondary | ICD-10-CM

## 2024-01-17 DIAGNOSIS — Z8249 Family history of ischemic heart disease and other diseases of the circulatory system: Secondary | ICD-10-CM

## 2024-01-17 DIAGNOSIS — Z886 Allergy status to analgesic agent status: Secondary | ICD-10-CM

## 2024-01-17 DIAGNOSIS — Z8673 Personal history of transient ischemic attack (TIA), and cerebral infarction without residual deficits: Secondary | ICD-10-CM

## 2024-01-17 DIAGNOSIS — Z91048 Other nonmedicinal substance allergy status: Secondary | ICD-10-CM

## 2024-01-17 DIAGNOSIS — D62 Acute posthemorrhagic anemia: Secondary | ICD-10-CM

## 2024-01-17 DIAGNOSIS — K317 Polyp of stomach and duodenum: Secondary | ICD-10-CM | POA: Diagnosis present

## 2024-01-17 DIAGNOSIS — H547 Unspecified visual loss: Secondary | ICD-10-CM | POA: Diagnosis present

## 2024-01-17 DIAGNOSIS — I5032 Chronic diastolic (congestive) heart failure: Secondary | ICD-10-CM | POA: Diagnosis present

## 2024-01-17 DIAGNOSIS — I051 Rheumatic mitral insufficiency: Secondary | ICD-10-CM | POA: Diagnosis present

## 2024-01-17 DIAGNOSIS — R197 Diarrhea, unspecified: Secondary | ICD-10-CM | POA: Diagnosis present

## 2024-01-17 DIAGNOSIS — K5731 Diverticulosis of large intestine without perforation or abscess with bleeding: Secondary | ICD-10-CM | POA: Diagnosis not present

## 2024-01-17 DIAGNOSIS — I10 Essential (primary) hypertension: Secondary | ICD-10-CM | POA: Diagnosis present

## 2024-01-17 DIAGNOSIS — E1122 Type 2 diabetes mellitus with diabetic chronic kidney disease: Secondary | ICD-10-CM | POA: Diagnosis present

## 2024-01-17 DIAGNOSIS — Z91013 Allergy to seafood: Secondary | ICD-10-CM

## 2024-01-17 DIAGNOSIS — Z79899 Other long term (current) drug therapy: Secondary | ICD-10-CM

## 2024-01-17 DIAGNOSIS — I130 Hypertensive heart and chronic kidney disease with heart failure and stage 1 through stage 4 chronic kidney disease, or unspecified chronic kidney disease: Secondary | ICD-10-CM | POA: Diagnosis present

## 2024-01-17 DIAGNOSIS — R55 Syncope and collapse: Secondary | ICD-10-CM | POA: Diagnosis present

## 2024-01-17 DIAGNOSIS — Z7984 Long term (current) use of oral hypoglycemic drugs: Secondary | ICD-10-CM

## 2024-01-17 DIAGNOSIS — Z9889 Other specified postprocedural states: Secondary | ICD-10-CM | POA: Diagnosis not present

## 2024-01-17 DIAGNOSIS — Z8042 Family history of malignant neoplasm of prostate: Secondary | ICD-10-CM

## 2024-01-17 DIAGNOSIS — K219 Gastro-esophageal reflux disease without esophagitis: Secondary | ICD-10-CM | POA: Diagnosis present

## 2024-01-17 DIAGNOSIS — Z96652 Presence of left artificial knee joint: Secondary | ICD-10-CM | POA: Diagnosis present

## 2024-01-17 DIAGNOSIS — D120 Benign neoplasm of cecum: Secondary | ICD-10-CM | POA: Diagnosis not present

## 2024-01-17 DIAGNOSIS — M1990 Unspecified osteoarthritis, unspecified site: Secondary | ICD-10-CM | POA: Diagnosis present

## 2024-01-17 DIAGNOSIS — Y838 Other surgical procedures as the cause of abnormal reaction of the patient, or of later complication, without mention of misadventure at the time of the procedure: Secondary | ICD-10-CM | POA: Diagnosis not present

## 2024-01-17 DIAGNOSIS — I272 Pulmonary hypertension, unspecified: Secondary | ICD-10-CM | POA: Diagnosis present

## 2024-01-17 DIAGNOSIS — R509 Fever, unspecified: Secondary | ICD-10-CM | POA: Diagnosis not present

## 2024-01-17 DIAGNOSIS — E785 Hyperlipidemia, unspecified: Secondary | ICD-10-CM | POA: Diagnosis present

## 2024-01-17 DIAGNOSIS — Z9049 Acquired absence of other specified parts of digestive tract: Secondary | ICD-10-CM

## 2024-01-17 DIAGNOSIS — R001 Bradycardia, unspecified: Secondary | ICD-10-CM | POA: Diagnosis not present

## 2024-01-17 DIAGNOSIS — R7989 Other specified abnormal findings of blood chemistry: Secondary | ICD-10-CM | POA: Diagnosis not present

## 2024-01-17 DIAGNOSIS — K921 Melena: Secondary | ICD-10-CM | POA: Diagnosis not present

## 2024-01-17 DIAGNOSIS — Z803 Family history of malignant neoplasm of breast: Secondary | ICD-10-CM

## 2024-01-17 DIAGNOSIS — Z833 Family history of diabetes mellitus: Secondary | ICD-10-CM

## 2024-01-17 DIAGNOSIS — Z90710 Acquired absence of both cervix and uterus: Secondary | ICD-10-CM

## 2024-01-17 DIAGNOSIS — I959 Hypotension, unspecified: Secondary | ICD-10-CM | POA: Diagnosis not present

## 2024-01-17 DIAGNOSIS — D696 Thrombocytopenia, unspecified: Secondary | ICD-10-CM | POA: Diagnosis present

## 2024-01-17 DIAGNOSIS — D509 Iron deficiency anemia, unspecified: Secondary | ICD-10-CM | POA: Diagnosis not present

## 2024-01-17 LAB — CBC WITH DIFFERENTIAL/PLATELET
Abs Immature Granulocytes: 0.08 10*3/uL — ABNORMAL HIGH (ref 0.00–0.07)
Basophils Absolute: 0 10*3/uL (ref 0.0–0.1)
Basophils Relative: 0 %
Eosinophils Absolute: 0.1 10*3/uL (ref 0.0–0.5)
Eosinophils Relative: 1 %
HCT: 23.8 % — ABNORMAL LOW (ref 36.0–46.0)
Hemoglobin: 7.3 g/dL — ABNORMAL LOW (ref 12.0–15.0)
Immature Granulocytes: 1 %
Lymphocytes Relative: 9 %
Lymphs Abs: 0.8 10*3/uL (ref 0.7–4.0)
MCH: 27.2 pg (ref 26.0–34.0)
MCHC: 30.7 g/dL (ref 30.0–36.0)
MCV: 88.8 fL (ref 80.0–100.0)
Monocytes Absolute: 0.3 10*3/uL (ref 0.1–1.0)
Monocytes Relative: 3 %
Neutro Abs: 7.1 10*3/uL (ref 1.7–7.7)
Neutrophils Relative %: 86 %
Platelets: 105 10*3/uL — ABNORMAL LOW (ref 150–400)
RBC: 2.68 MIL/uL — ABNORMAL LOW (ref 3.87–5.11)
RDW: 17.2 % — ABNORMAL HIGH (ref 11.5–15.5)
WBC: 8.4 10*3/uL (ref 4.0–10.5)
nRBC: 0 % (ref 0.0–0.2)

## 2024-01-17 LAB — RESP PANEL BY RT-PCR (RSV, FLU A&B, COVID)  RVPGX2
Influenza A by PCR: NEGATIVE
Influenza B by PCR: NEGATIVE
Resp Syncytial Virus by PCR: NEGATIVE
SARS Coronavirus 2 by RT PCR: NEGATIVE

## 2024-01-17 LAB — COMPREHENSIVE METABOLIC PANEL
ALT: 8 U/L (ref 0–44)
AST: 16 U/L (ref 15–41)
Albumin: 3.1 g/dL — ABNORMAL LOW (ref 3.5–5.0)
Alkaline Phosphatase: 57 U/L (ref 38–126)
Anion gap: 9 (ref 5–15)
BUN: 61 mg/dL — ABNORMAL HIGH (ref 8–23)
CO2: 23 mmol/L (ref 22–32)
Calcium: 9 mg/dL (ref 8.9–10.3)
Chloride: 109 mmol/L (ref 98–111)
Creatinine, Ser: 1.12 mg/dL — ABNORMAL HIGH (ref 0.44–1.00)
GFR, Estimated: 47 mL/min — ABNORMAL LOW (ref >60)
Glucose, Bld: 148 mg/dL — ABNORMAL HIGH (ref 70–99)
Potassium: 3.9 mmol/L (ref 3.5–5.1)
Sodium: 141 mmol/L (ref 135–145)
Total Bilirubin: 0.4 mg/dL (ref 0.0–1.2)
Total Protein: 6.4 g/dL — ABNORMAL LOW (ref 6.5–8.1)

## 2024-01-17 LAB — URINALYSIS, ROUTINE W REFLEX MICROSCOPIC
Bilirubin Urine: NEGATIVE
Glucose, UA: NEGATIVE mg/dL
Hgb urine dipstick: NEGATIVE
Ketones, ur: NEGATIVE mg/dL
Leukocytes,Ua: NEGATIVE
Nitrite: NEGATIVE
Protein, ur: 30 mg/dL — AB
Specific Gravity, Urine: 1.015 (ref 1.005–1.030)
pH: 5 (ref 5.0–8.0)

## 2024-01-17 LAB — LIPASE, BLOOD: Lipase: 30 U/L (ref 11–51)

## 2024-01-17 LAB — TROPONIN I (HIGH SENSITIVITY)
Troponin I (High Sensitivity): 49 ng/L — ABNORMAL HIGH (ref <18)
Troponin I (High Sensitivity): 51 ng/L — ABNORMAL HIGH (ref <18)

## 2024-01-17 LAB — CBG MONITORING, ED: Glucose-Capillary: 144 mg/dL — ABNORMAL HIGH (ref 70–99)

## 2024-01-17 MED ORDER — PROCHLORPERAZINE EDISYLATE 10 MG/2ML IJ SOLN: 5.0000 mg | Freq: Four times a day (QID) | INTRAMUSCULAR | Status: DC | PRN

## 2024-01-17 MED ORDER — DORZOLAMIDE HCL-TIMOLOL MAL 2-0.5 % OP SOLN
1.0000 [drp] | Freq: Two times a day (BID) | OPHTHALMIC | Status: DC
  Administered 2024-01-17 – 2024-01-28 (×22): 1 [drp] via OPHTHALMIC
  Filled 2024-01-17: qty 10

## 2024-01-17 MED ORDER — ACETAMINOPHEN 650 MG RE SUPP: 650.0000 mg | Freq: Four times a day (QID) | RECTAL | Status: DC | PRN

## 2024-01-17 MED ORDER — LATANOPROST 0.005 % OP SOLN
1.0000 [drp] | Freq: Every day | OPHTHALMIC | Status: DC
  Administered 2024-01-17 – 2024-01-27 (×11): 1 [drp] via OPHTHALMIC
  Filled 2024-01-17 (×2): qty 2.5

## 2024-01-17 MED ORDER — SENNOSIDES-DOCUSATE SODIUM 8.6-50 MG PO TABS: 1.0000 | ORAL_TABLET | Freq: Every evening | ORAL | Status: DC | PRN

## 2024-01-17 MED ORDER — LOSARTAN POTASSIUM 25 MG PO TABS: 50.0000 mg | ORAL_TABLET | Freq: Every day | ORAL | Status: DC

## 2024-01-17 MED ORDER — INSULIN ASPART 100 UNIT/ML IJ SOLN
0.0000 [IU] | INTRAMUSCULAR | Status: DC
  Administered 2024-01-18 – 2024-01-27 (×9): 1 [IU] via SUBCUTANEOUS
  Filled 2024-01-17: qty 0.06

## 2024-01-17 MED ORDER — SERTRALINE HCL 100 MG PO TABS
100.0000 mg | ORAL_TABLET | Freq: Every day | ORAL | Status: DC
  Administered 2024-01-18 – 2024-01-28 (×11): 100 mg via ORAL
  Filled 2024-01-17 (×9): qty 1
  Filled 2024-01-17: qty 2
  Filled 2024-01-17: qty 4

## 2024-01-17 MED ORDER — AMLODIPINE BESYLATE 5 MG PO TABS
5.0000 mg | ORAL_TABLET | Freq: Every day | ORAL | Status: DC
  Administered 2024-01-18 – 2024-01-28 (×11): 5 mg via ORAL
  Filled 2024-01-17 (×11): qty 1

## 2024-01-17 MED ORDER — ALLOPURINOL 100 MG PO TABS
50.0000 mg | ORAL_TABLET | Freq: Every day | ORAL | Status: DC
  Administered 2024-01-18 – 2024-01-28 (×11): 50 mg via ORAL
  Filled 2024-01-17 (×10): qty 1
  Filled 2024-01-17: qty 0.5

## 2024-01-17 MED ORDER — ROSUVASTATIN CALCIUM 5 MG PO TABS: 5.0000 mg | ORAL_TABLET | Freq: Every day | ORAL | Status: DC

## 2024-01-17 MED ORDER — SODIUM CHLORIDE 0.9 % IV BOLUS
500.0000 mL | Freq: Once | INTRAVENOUS | Status: AC
  Administered 2024-01-17: 500 mL via INTRAVENOUS

## 2024-01-17 MED ORDER — PANTOPRAZOLE SODIUM 40 MG IV SOLR
40.0000 mg | Freq: Two times a day (BID) | INTRAVENOUS | Status: DC
  Administered 2024-01-17 – 2024-01-19 (×4): 40 mg via INTRAVENOUS
  Filled 2024-01-17 (×4): qty 10

## 2024-01-17 MED ORDER — SODIUM CHLORIDE 0.9% FLUSH
3.0000 mL | Freq: Two times a day (BID) | INTRAVENOUS | Status: DC
  Administered 2024-01-17 – 2024-01-27 (×21): 3 mL via INTRAVENOUS

## 2024-01-17 MED ORDER — ACETAMINOPHEN 325 MG PO TABS
650.0000 mg | ORAL_TABLET | Freq: Four times a day (QID) | ORAL | Status: DC | PRN
  Administered 2024-01-21 – 2024-01-26 (×6): 650 mg via ORAL
  Filled 2024-01-17 (×7): qty 2

## 2024-01-17 NOTE — ED Triage Notes (Signed)
Pt BIB EMS for fever, episode of vomiting and diarrhea x 3 days. Pt was in car a had a syncopal episode.  BP 150/80 HR 55 O2 99% CBG 133

## 2024-01-17 NOTE — H&P (Signed)
History and Physical    Breanna [name] ZOX:[id number] DOB: December 01, 1931 DOA: 01/17/2024  PCP: Tresa Garter, MD   Patient coming from: Home   Chief Complaint: Loss of appetite, chills, lightheadedness, syncope   HPI: Breanna [name] is a 89 y.o. female with medical history significant for hypertension, type 2 diabetes mellitus, CKD 3B, history of CVA, and gout who presents with loss of appetite, chills, lightheadedness, and syncope.   Patient reports several days of chills, general malaise, loose stools, and lightheadedness upon standing.  She then had an episode today where she became acutely lightheaded while walking up steps with her son and suffered a brief loss of consciousness.  Her son was at her side and assisted her to the ground without any appreciable injury.  Patient regained awareness after a few seconds and had 1 episode of vomiting.  She denies any recent chest pain or palpitations.  She denies abdominal pain or dysuria.  ED Course: Upon arrival to the ED, patient is found to be afebrile and saturating well on room air with stable blood pressure.  EKG demonstrates sinus rhythm.  Chest x-ray notable for hyperinflation and cardiomegaly but no acute finding.  Labs are most notable for BUN 61, creatinine 1.12, normal WBC, hemoglobin 7.3, platelets 105,000, troponin 49, and negative respiratory virus panel.  Patient was given 500 mL of NS in the ED.  Review of Systems:  All other systems reviewed and apart from HPI, are negative.  Past Medical History:  Diagnosis Date   Adrenal adenoma 2006   Boils 2009   Cholelithiasis    asympt. w/normal HIDA 03/2010   CVA (cerebral infarction) 2010   Cerebellar   Diverticulitis    Esophageal spasm 2011   GERD (gastroesophageal reflux disease)    Gout    History of colon polyps    HTN (hypertension)    Hydronephrosis    LEFT/ Surgical intervention   Hyperlipidemia    LBP (low back pain)    Osteoarthritis    Pulmonary HTN (HCC)     Stress    Type II or unspecified type diabetes mellitus without mention of complication, not stated as uncontrolled     Past Surgical History:  Procedure Laterality Date   ABDOMINAL HYSTERECTOMY     APPENDECTOMY     2020   BACK SURGERY     BREAST BIOPSY     RIGHT   CATARACT EXTRACTION, BILATERAL     COLECTOMY  2006   Sigmoid   FOOT SURGERY     BILATERAL   HAMMER TOE SURGERY     INTRAMEDULLARY (IM) NAIL INTERTROCHANTERIC Right 07/21/2020   Procedure: INTRAMEDULLARY (IM) NAIL INTERTROCHANTRIC;  Surgeon: Sheral Apley, MD;  Location: [facility] OR;  Service: Orthopedics;  Laterality: Right;   ROTATOR CUFF REPAIR  2008   RIGHT   TOTAL KNEE ARTHROPLASTY  2003   LEFT   VARICOSE VEIN SURGERY     vein stripping/lower extremities    Social History:   reports that she has never smoked. She has never used smokeless tobacco. She reports that she does not drink alcohol and does not use drugs.  Allergies  Allergen Reactions   Verapamil Shortness Of Breath    REACTION: SOB   Aspirin Itching    But tolerates low dose   Atenolol     REACTION: fatigue   Codeine Itching   Codeine Sulfate    Hydrochlorothiazide     REACTION: gout   Hydrocodone  side effects - hallucinations   Ibuprofen     Upset stomach w/high doses   Lisinopril     REACTION: cough   Onion Other (See Comments)    Dry mouth/ gets sores   Shellfish Allergy Swelling    Patient stated she does not eat shellfish, she swells up on different parts of the body   Statins     Dizzy, falls   Valsartan Itching   Adhesive  [Tape] Rash   Other Rash    Family History  Problem Relation Age of Onset   Prostate cancer Father    Hypertension Father    Cancer Father        prostate   Heart disease Mother    Diabetes Mother    Diabetes Other        1st degree relative   Heart disease Other    Hypertension Other    Prostate cancer Maternal Uncle    Cancer Maternal Uncle        prostate   Breast cancer Daughter     Cancer Daughter        breast   Colon cancer Neg Hx      Prior to Admission medications   Medication Sig Start Date End Date Taking? Authorizing Provider  acetaminophen (TYLENOL) 325 MG tablet Take 2 tablets (650 mg total) by mouth every 6 (six) hours as needed for moderate pain or fever. 07/29/20   Lynn Ito, MD  Alcohol Swabs (TRUE COMFORT ALCOHOL PREP PADS) 70 % PADS Use as directed 04/26/21   Plotnikov, Georgina Quint, MD  allopurinol (ZYLOPRIM) 100 MG tablet Take 0.5 tablets (50 mg total) by mouth daily. 09/11/23   Plotnikov, Georgina Quint, MD  amLODipine (NORVASC) 5 MG tablet Take 1 tablet (5 mg total) by mouth daily. 05/14/23   Plotnikov, Georgina Quint, MD  ascorbic acid (VITAMIN C) 500 MG tablet TAKE 1 TABLET BY MOUTH EVERY DAY 02/05/22   Plotnikov, Georgina Quint, MD  ASPIRIN LOW DOSE 81 MG tablet TAKE 1 TABLET BY MOUTH DAILY. SWALLOW WHOLE. 10/01/22   Plotnikov, Georgina Quint, MD  atropine 1 % ophthalmic solution Place 1 drop into both eyes daily. 01/16/21   [provider]  Blood Glucose Calibration (TRUE METRIX LEVEL 1) Low SOLN USE AS DIRECTED 04/20/21   Plotnikov, Georgina Quint, MD  brimonidine (ALPHAGAN) 0.2 % ophthalmic solution Place 1 drop into the right eye 2 (two) times daily.    [provider]  calcium carbonate (OSCAL) 1500 (600 Ca) MG TABS tablet TAKE 1 TABLET BY MOUTH EVERY DAY 03/25/23   Plotnikov, Georgina Quint, MD  Cholecalciferol (VITAMIN D3) 25 MCG (1000 UT) CAPS Take 1 capsule (1,000 Units total) by mouth daily. 04/13/22   Plotnikov, Georgina Quint, MD  [employer] VITAMIN B12 1000 MCG tablet TAKE 1 TABLET (1,000 MCG TOTAL) BY MOUTH EVERY DAY 05/13/23   Plotnikov, Georgina Quint, MD  diclofenac sodium (VOLTAREN) 1 % GEL Apply 2 g topically 3 (three) times daily. To left shoulder 03/03/23   Rai, Ripudeep K, MD  docusate sodium (COLACE) 100 MG capsule TAKE 1 CAPSULE BY MOUTH TWICE A DAY 03/25/23   Plotnikov, Georgina Quint, MD  dorzolamide-timolol (COSOPT) 22.3-6.8 MG/ML ophthalmic solution Place 1 drop into  both eyes 2 (two) times daily.     [provider]  feeding supplement, ENSURE ENLIVE, (ENSURE ENLIVE) LIQD Take 237 mLs by mouth daily at 2 PM. 07/30/20   Lynn Ito, MD  gabapentin (NEURONTIN) 100 MG capsule Take 1 capsule (100  mg total) by mouth 2 (two) times daily. 08/12/23   Plotnikov, Georgina Quint, MD  glucose blood (GNP TRUE METRIX GLUCOSE STRIPS) test strip Use to check blood sugars twice a day 04/26/21   Plotnikov, Georgina Quint, MD  latanoprost (XALATAN) 0.005 % ophthalmic solution Place 1 drop into both eyes at bedtime.  04/26/16   [provider]  lidocaine (LIDODERM) 5 % Place 1 patch onto the skin daily. Remove & Discard patch within 12 hours or as directed by MD. Apply to left shoulder 03/03/23   Rai, Ripudeep K, MD  lidocaine-prilocaine (EMLA) cream Apply 1 application topically as needed. 06/15/21   Asencion Islam, DPM  losartan (COZAAR) 100 MG tablet Take 0.5 tablets (50 mg total) by mouth daily. 08/12/23   Plotnikov, Georgina Quint, MD  metFORMIN (GLUCOPHAGE) 500 MG tablet TAKE 1 TABLET BY MOUTH EVERY DAY WITH BREAKFAST 08/04/23   Plotnikov, Georgina Quint, MD  mupirocin ointment (BACTROBAN) 2 % On leg wound w/dressing change qd or bid 06/14/23   Plotnikov, Georgina Quint, MD  Netarsudil-Latanoprost 0.02-0.005 % SOLN Apply to eye.    [provider]  NON FORMULARY Place 10 drops under the tongue daily as needed (Pain). Medication: CBD oil    [provider]  pantoprazole (PROTONIX) 40 MG tablet TAKE 1 TABLET BY MOUTH EVERY DAY 11/14/23   Plotnikov, Georgina Quint, MD  polyethylene glycol (MIRALAX / GLYCOLAX) 17 g packet Take 17 g by mouth daily as needed for mild constipation. 07/29/20   Lynn Ito, MD  rosuvastatin (CRESTOR) 5 MG tablet Take 5 mg by mouth daily. 11/11/23   [provider]  senna (SENOKOT) 8.6 MG TABS tablet Take 1 tablet (8.6 mg total) by mouth 2 (two) times daily. 07/29/20   Lynn Ito, MD  sertraline (ZOLOFT) 100 MG tablet TAKE 1 TABLET BY MOUTH EVERY  DAY 11/18/23   Plotnikov, Georgina Quint, MD  spironolactone (ALDACTONE) 25 MG tablet TAKE 1 TABLET BY MOUTH ON MONDAYS, [facility] AND FRIDAYS 11/14/23   Plotnikov, Georgina Quint, MD  traMADol (ULTRAM) 50 MG tablet Take 1 tablet (50 mg total) by mouth every 8 (eight) hours as needed for severe pain. 03/03/23   Rai, Ripudeep K, MD  TRUEplus Lancets 33G MISC USE AS DIRECTED TO CHECK BLOOD SUGAR TWICE A DAY 07/23/22   Plotnikov, Georgina Quint, MD  valACYclovir (VALTREX) 500 MG tablet Take 1 tablet (500 mg total) by mouth daily as needed (for outbreaks). 03/03/23   Cathren Harsh, MD    Physical Exam: Vitals:   01/17/24 1653 01/17/24 1930  BP: (!) 155/63 (!) 161/81  Pulse: (!) 57 66  Resp: 18 (!) 25  Temp: 97.7 F (36.5 C)   TempSrc: Oral   SpO2: 98% 98%  Weight: 51.3 kg   Height: 5\' 2"  (1.575 m)     Constitutional: NAD, calm  Eyes: Opaque cornea, lids normal ENMT: Mucous membranes are moist. Posterior pharynx clear of any exudate or lesions.   Neck: supple, no masses  Respiratory: no wheezing, no crackles. No accessory muscle use.  Cardiovascular: S1 & S2 heard, regular rate and rhythm. No extremity edema.   Abdomen: No distension, no tenderness, soft. Bowel sounds active.  Musculoskeletal: no clubbing / cyanosis. No joint deformity upper and lower extremities.   Skin: no significant rashes, lesions, ulcers. Warm, dry, well-perfused. Neurologic: No gross facial asymmetry, no dysarthria or aphasia. Moving all extremities. Alert and oriented to person, place, and situation.  Psychiatric: Pleasant. Cooperative.    Labs and Imaging on  Admission: I have personally reviewed following labs and imaging studies  CBC: Recent Labs  Lab 01/17/24 1730  WBC 8.4  NEUTROABS 7.1  HGB 7.3*  HCT 23.8*  MCV 88.8  PLT 105*   Basic Metabolic Panel: Recent Labs  Lab 01/17/24 1730  NA 141  K 3.9  CL 109  CO2 23  GLUCOSE 148*  BUN 61*  CREATININE 1.12*  CALCIUM 9.0   GFR: Estimated Creatinine  Clearance: 26.4 mL/min (A) (by C-G formula based on SCr of 1.12 mg/dL (H)). Liver Function Tests: Recent Labs  Lab 01/17/24 1730  AST 16  ALT 8  ALKPHOS 57  BILITOT 0.4  PROT 6.4*  ALBUMIN 3.1*   Recent Labs  Lab 01/17/24 1730  LIPASE 30   No results for input(s): "AMMONIA" in the last 168 hours. Coagulation Profile: No results for input(s): "INR", "PROTIME" in the last 168 hours. Cardiac Enzymes: No results for input(s): "CKTOTAL", "CKMB", "CKMBINDEX", "TROPONINI" in the last 168 hours. BNP (last 3 results) No results for input(s): "PROBNP" in the last 8760 hours. HbA1C: No results for input(s): "HGBA1C" in the last 72 hours. CBG: No results for input(s): "GLUCAP" in the last 168 hours. Lipid Profile: No results for input(s): "CHOL", "HDL", "LDLCALC", "TRIG", "CHOLHDL", "LDLDIRECT" in the last 72 hours. Thyroid Function Tests: No results for input(s): "TSH", "T4TOTAL", "FREET4", "T3FREE", "THYROIDAB" in the last 72 hours. Anemia Panel: No results for input(s): "VITAMINB12", "FOLATE", "FERRITIN", "TIBC", "IRON", "RETICCTPCT" in the last 72 hours. Urine analysis:    Component Value Date/Time   COLORURINE STRAW (A) 01/17/2024 1858   APPEARANCEUR CLEAR 01/17/2024 1858   LABSPEC 1.015 01/17/2024 1858   PHURINE 5.0 01/17/2024 1858   GLUCOSEU NEGATIVE 01/17/2024 1858   GLUCOSEU NEGATIVE 10/16/2023 1248   HGBUR NEGATIVE 01/17/2024 1858   BILIRUBINUR NEGATIVE 01/17/2024 1858   KETONESUR NEGATIVE 01/17/2024 1858   PROTEINUR 30 (A) 01/17/2024 1858   UROBILINOGEN 0.2 10/16/2023 1248   NITRITE NEGATIVE 01/17/2024 1858   LEUKOCYTESUR NEGATIVE 01/17/2024 1858   Sepsis Labs: @LABRCNTIP (procalcitonin:4,lacticidven:4) ) Recent Results (from the past 240 hours)  Resp panel by RT-PCR (RSV, Flu A&B, Covid) Anterior Nasal Swab     Status: None   Collection Time: 01/17/24  5:20 PM   Specimen: Anterior Nasal Swab  Result Value Ref Range Status   SARS Coronavirus 2 by RT PCR  NEGATIVE NEGATIVE Final    Comment: (NOTE) SARS-CoV-2 target nucleic acids are NOT DETECTED.  The SARS-CoV-2 RNA is generally detectable in upper respiratory specimens during the acute phase of infection. The lowest concentration of SARS-CoV-2 viral copies this assay can detect is 138 copies/mL. A negative result does not preclude SARS-Cov-2 infection and should not be used as the sole basis for treatment or other patient management decisions. A negative result may occur with  improper specimen collection/handling, submission of specimen other than nasopharyngeal swab, presence of viral mutation(s) within the areas targeted by this assay, and inadequate number of viral copies(<138 copies/mL). A negative result must be combined with clinical observations, patient history, and epidemiological information. The expected result is Negative.  Fact Sheet for Patients:  BloggerCourse.com  Fact Sheet for Healthcare Providers:  SeriousBroker.it  This test is no t yet approved or cleared by the [place] FDA and  has been authorized for detection and/or diagnosis of SARS-CoV-2 by FDA under an Emergency Use Authorization (EUA). This EUA will remain  in effect (meaning this test can be used) for the duration of the COVID-19 declaration under Section 564(b)(1)  of the Act, 21 U.S.C.section 360bbb-3(b)(1), unless the authorization is terminated  or revoked sooner.       Influenza A by PCR NEGATIVE NEGATIVE Final   Influenza B by PCR NEGATIVE NEGATIVE Final    Comment: (NOTE) The Xpert Xpress SARS-CoV-2/FLU/RSV plus assay is intended as an aid in the diagnosis of influenza from Nasopharyngeal swab specimens and should not be used as a sole basis for treatment. Nasal washings and aspirates are unacceptable for Xpert Xpress SARS-CoV-2/FLU/RSV testing.  Fact Sheet for Patients: BloggerCourse.com  Fact Sheet for  Healthcare Providers: SeriousBroker.it  This test is not yet approved or cleared by the [place] FDA and has been authorized for detection and/or diagnosis of SARS-CoV-2 by FDA under an Emergency Use Authorization (EUA). This EUA will remain in effect (meaning this test can be used) for the duration of the COVID-19 declaration under Section 564(b)(1) of the Act, 21 U.S.C. section 360bbb-3(b)(1), unless the authorization is terminated or revoked.     Resp Syncytial Virus by PCR NEGATIVE NEGATIVE Final    Comment: (NOTE) Fact Sheet for Patients: BloggerCourse.com  Fact Sheet for Healthcare Providers: SeriousBroker.it  This test is not yet approved or cleared by the [place] FDA and has been authorized for detection and/or diagnosis of SARS-CoV-2 by FDA under an Emergency Use Authorization (EUA). This EUA will remain in effect (meaning this test can be used) for the duration of the COVID-19 declaration under Section 564(b)(1) of the Act, 21 U.S.C. section 360bbb-3(b)(1), unless the authorization is terminated or revoked.  Performed at [facility], [address], [place], [place] [address]      Radiological Exams on Admission: DG Chest Portable 1 View Result Date: 01/17/2024 CLINICAL DATA:  Fever.  Vomiting and diarrhea for 3 days.  Syncope EXAM: PORTABLE CHEST 1 VIEW COMPARISON:  X-ray 02/28/2023. FINDINGS: Hyperinflation. No consolidation, pneumothorax or effusion. No edema. Enlarged cardiopericardial silhouette tortuous aorta, similar to previous. Osteopenia. IMPRESSION: Hyperinflation.  Enlarged heart.  No consolidation. Electronically Signed   By: Karen Kays M.D.   On: 01/17/2024 17:50    EKG: Independently reviewed. Sinus rhythm.   Assessment/Plan   1. Syncope - She was lightheaded with standing, has acute on chronic anemia, recent loose stools and loss of appetite,  and this is may be related to orthostasis but she has risk factors for cardiac etiology and abnormal EKG  - Check orthostatic vitals, continue cardiac monitoring, check echocardiogram    2. Anemia; thrombocytopenia  - Hgb is 7.3 in ED, down from 9.9 in October 2024; platelets are 105,000 and have been intermittently low in the past  - BUN is 61, down from 32 in October, d/t hypovolemia +/- UGIB - Patient is blind; her son has not seen any melena or gross blood  - Check FOBT and anemia panel, repeat CBC in am, transfuse if needed   3. Hypertension  - Norvasc, losartan    4. Chronic diastolic CHF  - Appears hypovolemic, will hold Aldactone for now   5. CKD 3B  - Appears close to baseline  - Renally-dose medications    6. Type II DM  - A1c was 6.8% in September 2024  - Check CBGs and use low-intensity SSI for now   7. Hx of CVA  - Hold ASA while evaluating for GIB, continue Crestor    8. Elevated troponin  - Troponin is mildly elevated and flat without anginal symptoms and not consistent with ACS  - Follow-up echo findings  DVT prophylaxis: SCDs  Code Status: Full, discussed with patient and her son in ED  Level of Care: Level of care: Telemetry Family Communication: Son at bedside   Disposition Plan:  Patient is from: Home Anticipated d/c is to: TBD Anticipated d/c date is: Possibly as early as 01/18/24  Patient currently: Pending orthostatic vitals, cardiac monitoring, echocardiogram, FOBT, repeat CBC  Consults called: None Admission status: Observation     Briscoe Deutscher, MD [facility]  01/17/2024, 9:39 PM

## 2024-01-17 NOTE — ED Provider Notes (Signed)
[place] EMERGENCY DEPARTMENT AT [facility] Provider Note   CSN: [id number] Arrival date & time: 01/17/24  1638     History  Chief Complaint  Patient presents with   Fever   Weakness    Breanna [name] is a 89 y.o. female.  89 year old female with past medical history of diabetes, hypertension, and hyperlipidemia presents the emergency department today with subjective fevers and chills as well as some loose stools yesterday.  Patient also reports some nasal congestion over the past 2 days.  She has had some generalized weakness this week but no focal neurological deficits.  States that she had 2 loose stools yesterday.  These were nonbloody and nonmelanotic.  The patient denies any associated urinary symptoms.  She apparently had an episode today where she had a syncopal episode.  Her son was with her and she did not have any injury secondary to this.  She was feeling weak after this but was unresponsive for a few seconds.  She was brought to the ER at that time for further evaluation.  The patient has not had any chest pain with this.  She reports that she has not really been eating or drinking much since she has been feeling unwell over the past 2 to 3 days.   Fever Weakness Associated symptoms: fever        Home Medications Prior to Admission medications   Medication Sig Start Date End Date Taking? Authorizing Provider  acetaminophen (TYLENOL) 325 MG tablet Take 2 tablets (650 mg total) by mouth every 6 (six) hours as needed for moderate pain or fever. 07/29/20   Lynn Ito, MD  Alcohol Swabs (TRUE COMFORT ALCOHOL PREP PADS) 70 % PADS Use as directed 04/26/21   Plotnikov, Georgina Quint, MD  allopurinol (ZYLOPRIM) 100 MG tablet Take 0.5 tablets (50 mg total) by mouth daily. 09/11/23   Plotnikov, Georgina Quint, MD  amLODipine (NORVASC) 5 MG tablet Take 1 tablet (5 mg total) by mouth daily. 05/14/23   Plotnikov, Georgina Quint, MD  ascorbic acid (VITAMIN C) 500 MG tablet TAKE 1 TABLET  BY MOUTH EVERY DAY 02/05/22   Plotnikov, Georgina Quint, MD  ASPIRIN LOW DOSE 81 MG tablet TAKE 1 TABLET BY MOUTH DAILY. SWALLOW WHOLE. 10/01/22   Plotnikov, Georgina Quint, MD  atropine 1 % ophthalmic solution Place 1 drop into both eyes daily. 01/16/21   [provider]  Blood Glucose Calibration (TRUE METRIX LEVEL 1) Low SOLN USE AS DIRECTED 04/20/21   Plotnikov, Georgina Quint, MD  brimonidine (ALPHAGAN) 0.2 % ophthalmic solution Place 1 drop into the right eye 2 (two) times daily.    [provider]  calcium carbonate (OSCAL) 1500 (600 Ca) MG TABS tablet TAKE 1 TABLET BY MOUTH EVERY DAY 03/25/23   Plotnikov, Georgina Quint, MD  Cholecalciferol (VITAMIN D3) 25 MCG (1000 UT) CAPS Take 1 capsule (1,000 Units total) by mouth daily. 04/13/22   Plotnikov, Georgina Quint, MD  [facility] VITAMIN B12 1000 MCG tablet TAKE 1 TABLET (1,000 MCG TOTAL) BY MOUTH EVERY DAY 05/13/23   Plotnikov, Georgina Quint, MD  diclofenac sodium (VOLTAREN) 1 % GEL Apply 2 g topically 3 (three) times daily. To left shoulder 03/03/23   Rai, Ripudeep K, MD  docusate sodium (COLACE) 100 MG capsule TAKE 1 CAPSULE BY MOUTH TWICE A DAY 03/25/23   Plotnikov, Georgina Quint, MD  dorzolamide-timolol (COSOPT) 22.3-6.8 MG/ML ophthalmic solution Place 1 drop into both eyes 2 (two) times daily.     [provider]  feeding supplement, ENSURE ENLIVE, (ENSURE ENLIVE) LIQD Take 237 mLs by mouth daily at 2 PM. 07/30/20   Lynn Ito, MD  gabapentin (NEURONTIN) 100 MG capsule Take 1 capsule (100 mg total) by mouth 2 (two) times daily. 08/12/23   Plotnikov, Georgina Quint, MD  glucose blood (GNP TRUE METRIX GLUCOSE STRIPS) test strip Use to check blood sugars twice a day 04/26/21   Plotnikov, Georgina Quint, MD  latanoprost (XALATAN) 0.005 % ophthalmic solution Place 1 drop into both eyes at bedtime.  04/26/16   [provider]  lidocaine (LIDODERM) 5 % Place 1 patch onto the skin daily. Remove & Discard patch within 12 hours or as directed by MD. Apply to left shoulder 03/03/23    Rai, Ripudeep K, MD  lidocaine-prilocaine (EMLA) cream Apply 1 application topically as needed. 06/15/21   Asencion Islam, DPM  losartan (COZAAR) 100 MG tablet Take 0.5 tablets (50 mg total) by mouth daily. 08/12/23   Plotnikov, Georgina Quint, MD  metFORMIN (GLUCOPHAGE) 500 MG tablet TAKE 1 TABLET BY MOUTH EVERY DAY WITH BREAKFAST 08/04/23   Plotnikov, Georgina Quint, MD  mupirocin ointment (BACTROBAN) 2 % On leg wound w/dressing change qd or bid 06/14/23   Plotnikov, Georgina Quint, MD  Netarsudil-Latanoprost 0.02-0.005 % SOLN Apply to eye.    [provider]  NON FORMULARY Place 10 drops under the tongue daily as needed (Pain). Medication: CBD oil    [provider]  pantoprazole (PROTONIX) 40 MG tablet TAKE 1 TABLET BY MOUTH EVERY DAY 11/14/23   Plotnikov, Georgina Quint, MD  polyethylene glycol (MIRALAX / GLYCOLAX) 17 g packet Take 17 g by mouth daily as needed for mild constipation. 07/29/20   Lynn Ito, MD  rosuvastatin (CRESTOR) 5 MG tablet Take 5 mg by mouth daily. 11/11/23   [provider]  senna (SENOKOT) 8.6 MG TABS tablet Take 1 tablet (8.6 mg total) by mouth 2 (two) times daily. 07/29/20   Lynn Ito, MD  sertraline (ZOLOFT) 100 MG tablet TAKE 1 TABLET BY MOUTH EVERY DAY 11/18/23   Plotnikov, Georgina Quint, MD  spironolactone (ALDACTONE) 25 MG tablet TAKE 1 TABLET BY MOUTH ON MONDAYS, [facility] AND FRIDAYS 11/14/23   Plotnikov, Georgina Quint, MD  traMADol (ULTRAM) 50 MG tablet Take 1 tablet (50 mg total) by mouth every 8 (eight) hours as needed for severe pain. 03/03/23   Rai, Ripudeep K, MD  TRUEplus Lancets 33G MISC USE AS DIRECTED TO CHECK BLOOD SUGAR TWICE A DAY 07/23/22   Plotnikov, Georgina Quint, MD  valACYclovir (VALTREX) 500 MG tablet Take 1 tablet (500 mg total) by mouth daily as needed (for outbreaks). 03/03/23   Rai, Delene Ruffini, MD      Allergies    Verapamil, Aspirin, Atenolol, Codeine, Codeine sulfate, Hydrochlorothiazide, Hydrocodone, Ibuprofen, Lisinopril, Onion, Shellfish  allergy, Statins, Valsartan, Adhesive  [tape], and Other    Review of Systems   Review of Systems  Constitutional:  Positive for fever.  Neurological:  Positive for syncope and weakness.  All other systems reviewed and are negative.   Physical Exam Updated Vital Signs BP (!) 161/81   Pulse 66   Temp 97.7 F (36.5 C) (Oral)   Resp (!) 25   Ht 5\' 2"  (1.575 m)   Wt 51.3 kg   SpO2 98%   BMI 20.67 kg/m  Physical Exam Vitals and nursing note reviewed.   Gen: NAD Eyes: PERRL, EOMI HEENT: no oropharyngeal swelling Neck: trachea midline Resp: clear to auscultation bilaterally Card: RRR, no murmurs, rubs,  or gallops Abd: nontender, nondistended Extremities: no calf tenderness, no edema Vascular: 2+ radial pulses bilaterally, 2+ DP pulses bilaterally Neuro: no focal deficits Skin: no rashes Psyc: acting appropriately   ED Results / Procedures / Treatments   Labs (all labs ordered are listed, but only abnormal results are displayed) Labs Reviewed  CBC WITH DIFFERENTIAL/PLATELET - Abnormal; Notable for the following components:      Result Value   RBC 2.68 (*)    Hemoglobin 7.3 (*)    HCT 23.8 (*)    RDW 17.2 (*)    Platelets 105 (*)    Abs Immature Granulocytes 0.08 (*)    All other components within normal limits  COMPREHENSIVE METABOLIC PANEL - Abnormal; Notable for the following components:   Glucose, Bld 148 (*)    BUN 61 (*)    Creatinine, Ser 1.12 (*)    Total Protein 6.4 (*)    Albumin 3.1 (*)    GFR, Estimated 47 (*)    All other components within normal limits  URINALYSIS, ROUTINE W REFLEX MICROSCOPIC - Abnormal; Notable for the following components:   Color, Urine STRAW (*)    Protein, ur 30 (*)    Bacteria, UA RARE (*)    All other components within normal limits  TROPONIN I (HIGH SENSITIVITY) - Abnormal; Notable for the following components:   Troponin I (High Sensitivity) 49 (*)    All other components within normal limits  RESP PANEL BY RT-PCR  (RSV, FLU A&B, COVID)  [id number]  LIPASE, BLOOD  POC OCCULT BLOOD, ED  TROPONIN I (HIGH SENSITIVITY)    EKG EKG Interpretation Date/Time:  Friday January 17 2024 18:04:54 EST Ventricular Rate:  58 PR Interval:  183 QRS Duration:  95 QT Interval:  476 QTC Calculation: 468 R Axis:   18  Text Interpretation: Sinus rhythm Posterior infarct, old Borderline repolarization abnormality Confirmed by Beckey Downing [phone]) on 01/17/2024 6:06:39 PM  Radiology DG Chest Portable 1 View Result Date: 01/17/2024 CLINICAL DATA:  Fever.  Vomiting and diarrhea for 3 days.  Syncope EXAM: PORTABLE CHEST 1 VIEW COMPARISON:  X-ray 02/28/2023. FINDINGS: Hyperinflation. No consolidation, pneumothorax or effusion. No edema. Enlarged cardiopericardial silhouette tortuous aorta, similar to previous. Osteopenia. IMPRESSION: Hyperinflation.  Enlarged heart.  No consolidation. Electronically Signed   By: Karen Kays M.D.   On: 01/17/2024 17:50    Procedures Procedures    Medications Ordered in ED Medications  sodium chloride 0.9 % bolus 500 mL (0 mLs Intravenous Stopped 01/17/24 1847)    ED Course/ Medical Decision Making/ A&P                                 Medical Decision Making 89 year old female with past medical history of diabetes, hypertension, and hyperlipidemia presenting to the emergency department today with an episode of syncope.  I will further evaluate the patient here with basic labs to evaluate for anemia or electrolyte abnormalities.  Given the vomiting today I will obtain an EKG and troponin to evaluate for atypical ACS.  She does not have any abdominal tenderness here on exam.  Will obtain a urinalysis and chest x-ray as well as an RSV/COVID/flu swab on the patient given her complaint of subjective fevers.  She is afebrile here.  I will reevaluate for ultimate disposition.  She does not have any focal neurological deficits on exam to suggest CVA at this time.  The patient's workup here is  reassuring with the exception of her hemoglobin which is trending lower.  The patient's hemoglobin is down to 7.3.  In talking to the family she has had issues with anemia requiring transfusion before.  They deny any known blood in her stool or dark stools.  A call was placed to hospital service for admission.  Amount and/or Complexity of Data Reviewed Labs: ordered. Radiology: ordered.  Risk Decision regarding hospitalization.           Final Clinical Impression(s) / ED Diagnoses Final diagnoses:  Anemia, unspecified type  Syncope, unspecified syncope type    Rx / DC Orders ED Discharge Orders     None         Durwin Glaze, MD 01/17/24 2031

## 2024-01-18 ENCOUNTER — Observation Stay (HOSPITAL_COMMUNITY): Payer: Medicare PPO

## 2024-01-18 DIAGNOSIS — D62 Acute posthemorrhagic anemia: Secondary | ICD-10-CM

## 2024-01-18 DIAGNOSIS — R195 Other fecal abnormalities: Secondary | ICD-10-CM

## 2024-01-18 DIAGNOSIS — D649 Anemia, unspecified: Secondary | ICD-10-CM | POA: Diagnosis not present

## 2024-01-18 DIAGNOSIS — D509 Iron deficiency anemia, unspecified: Secondary | ICD-10-CM | POA: Diagnosis not present

## 2024-01-18 DIAGNOSIS — R55 Syncope and collapse: Secondary | ICD-10-CM

## 2024-01-18 DIAGNOSIS — K317 Polyp of stomach and duodenum: Secondary | ICD-10-CM | POA: Diagnosis not present

## 2024-01-18 DIAGNOSIS — K5731 Diverticulosis of large intestine without perforation or abscess with bleeding: Secondary | ICD-10-CM | POA: Diagnosis not present

## 2024-01-18 LAB — BASIC METABOLIC PANEL
Anion gap: 8 (ref 5–15)
BUN: 64 mg/dL — ABNORMAL HIGH (ref 8–23)
CO2: 22 mmol/L (ref 22–32)
Calcium: 8.6 mg/dL — ABNORMAL LOW (ref 8.9–10.3)
Chloride: 112 mmol/L — ABNORMAL HIGH (ref 98–111)
Creatinine, Ser: 0.94 mg/dL (ref 0.44–1.00)
GFR, Estimated: 58 mL/min — ABNORMAL LOW (ref >60)
Glucose, Bld: 128 mg/dL — ABNORMAL HIGH (ref 70–99)
Potassium: 4.1 mmol/L (ref 3.5–5.1)
Sodium: 142 mmol/L (ref 135–145)

## 2024-01-18 LAB — RETICULOCYTES
Immature Retic Fract: 12.6 % (ref 2.3–15.9)
RBC.: 2.25 MIL/uL — ABNORMAL LOW (ref 3.87–5.11)
Retic Count, Absolute: 40.1 10*3/uL (ref 19.0–186.0)
Retic Ct Pct: 1.8 % (ref 0.4–3.1)

## 2024-01-18 LAB — IRON AND TIBC
Iron: 44 ug/dL (ref 28–170)
Saturation Ratios: 16 % (ref 10.4–31.8)
TIBC: 283 ug/dL (ref 250–450)
UIBC: 229 ug/dL

## 2024-01-18 LAB — ECHOCARDIOGRAM COMPLETE
Area-P 1/2: 3.65 cm2
Calc EF: 62.7 %
Height: 62 in
MV M vel: 6.51 m/s
MV Peak grad: 169.5 mm[Hg]
P 1/2 time: 654 ms
Radius: 0.4 cm
S' Lateral: 2.4 cm
Single Plane A2C EF: 59.9 %
Single Plane A4C EF: 65.7 %
Weight: 1808 [oz_av]

## 2024-01-18 LAB — CBC
HCT: 19.4 % — ABNORMAL LOW (ref 36.0–46.0)
HCT: 25 % — ABNORMAL LOW (ref 36.0–46.0)
HCT: 23.2 % — ABNORMAL LOW (ref 36.0–46.0)
Hemoglobin: 6.2 g/dL — CL (ref 12.0–15.0)
Hemoglobin: 8 g/dL — ABNORMAL LOW (ref 12.0–15.0)
Hemoglobin: 7.4 g/dL — ABNORMAL LOW (ref 12.0–15.0)
MCH: 27.9 pg (ref 26.0–34.0)
MCH: 28.4 pg (ref 26.0–34.0)
MCH: 28.2 pg (ref 26.0–34.0)
MCHC: 32 g/dL (ref 30.0–36.0)
MCHC: 32 g/dL (ref 30.0–36.0)
MCHC: 31.9 g/dL (ref 30.0–36.0)
MCV: 87.4 fL (ref 80.0–100.0)
MCV: 88.7 fL (ref 80.0–100.0)
MCV: 88.5 fL (ref 80.0–100.0)
Platelets: 91 10*3/uL — ABNORMAL LOW (ref 150–400)
Platelets: 92 10*3/uL — ABNORMAL LOW (ref 150–400)
Platelets: 83 10*3/uL — ABNORMAL LOW (ref 150–400)
RBC: 2.22 MIL/uL — ABNORMAL LOW (ref 3.87–5.11)
RBC: 2.82 MIL/uL — ABNORMAL LOW (ref 3.87–5.11)
RBC: 2.62 MIL/uL — ABNORMAL LOW (ref 3.87–5.11)
RDW: 17.2 % — ABNORMAL HIGH (ref 11.5–15.5)
RDW: 16.6 % — ABNORMAL HIGH (ref 11.5–15.5)
RDW: 16.8 % — ABNORMAL HIGH (ref 11.5–15.5)
WBC: 6.1 10*3/uL (ref 4.0–10.5)
WBC: 6.7 10*3/uL (ref 4.0–10.5)
WBC: 5.8 10*3/uL (ref 4.0–10.5)
nRBC: 0 % (ref 0.0–0.2)
nRBC: 0 % (ref 0.0–0.2)
nRBC: 0 % (ref 0.0–0.2)

## 2024-01-18 LAB — FERRITIN: Ferritin: 28 ng/mL (ref 11–307)

## 2024-01-18 LAB — GLUCOSE, CAPILLARY
Glucose-Capillary: 102 mg/dL — ABNORMAL HIGH (ref 70–99)
Glucose-Capillary: 127 mg/dL — ABNORMAL HIGH (ref 70–99)
Glucose-Capillary: 159 mg/dL — ABNORMAL HIGH (ref 70–99)

## 2024-01-18 LAB — CBG MONITORING, ED
Glucose-Capillary: 105 mg/dL — ABNORMAL HIGH (ref 70–99)
Glucose-Capillary: 123 mg/dL — ABNORMAL HIGH (ref 70–99)
Glucose-Capillary: 88 mg/dL (ref 70–99)
Glucose-Capillary: 91 mg/dL (ref 70–99)

## 2024-01-18 LAB — VITAMIN B12: Vitamin B-12: 1264 pg/mL — ABNORMAL HIGH (ref 180–914)

## 2024-01-18 LAB — PREPARE RBC (CROSSMATCH)

## 2024-01-18 LAB — POTASSIUM: Potassium: 3.1 mmol/L — ABNORMAL LOW (ref 3.5–5.1)

## 2024-01-18 LAB — POC OCCULT BLOOD, ED: Fecal Occult Bld: POSITIVE — AB

## 2024-01-18 LAB — FOLATE: Folate: 18.8 ng/mL (ref >5.9)

## 2024-01-18 LAB — MAGNESIUM: Magnesium: 2.1 mg/dL (ref 1.7–2.4)

## 2024-01-18 MED ORDER — GABAPENTIN 100 MG PO CAPS
100.0000 mg | ORAL_CAPSULE | Freq: Two times a day (BID) | ORAL | Status: DC
  Administered 2024-01-18 – 2024-01-28 (×21): 100 mg via ORAL
  Filled 2024-01-18 (×21): qty 1

## 2024-01-18 MED ORDER — POTASSIUM CHLORIDE 20 MEQ PO PACK
40.0000 meq | PACK | Freq: Once | ORAL | Status: AC
  Administered 2024-01-18: 40 meq via ORAL
  Filled 2024-01-18: qty 2

## 2024-01-18 MED ORDER — HYDRALAZINE HCL 20 MG/ML IJ SOLN: 5.0000 mg | Freq: Four times a day (QID) | INTRAMUSCULAR | Status: DC | PRN

## 2024-01-18 MED ORDER — SODIUM CHLORIDE 0.9% IV SOLUTION: Freq: Once | INTRAVENOUS | Status: AC

## 2024-01-18 MED ORDER — ATROPINE SULFATE 1 % OP SOLN: 1.0000 [drp] | Freq: Every day | OPHTHALMIC | Status: DC

## 2024-01-18 MED ORDER — FUROSEMIDE 10 MG/ML IJ SOLN
20.0000 mg | Freq: Once | INTRAMUSCULAR | Status: AC
  Administered 2024-01-18: 20 mg via INTRAVENOUS
  Filled 2024-01-18: qty 4

## 2024-01-18 MED ORDER — VITAMIN C 500 MG PO TABS
500.0000 mg | ORAL_TABLET | Freq: Every day | ORAL | Status: DC
  Administered 2024-01-18 – 2024-01-28 (×11): 500 mg via ORAL
  Filled 2024-01-18 (×11): qty 1

## 2024-01-18 MED ORDER — BOOST / RESOURCE BREEZE PO LIQD CUSTOM
1.0000 | Freq: Three times a day (TID) | ORAL | Status: DC
  Administered 2024-01-20: 237 via ORAL
  Administered 2024-01-21 – 2024-01-23 (×7): 1 via ORAL

## 2024-01-18 MED ORDER — SODIUM CHLORIDE 0.9% FLUSH
3.0000 mL | Freq: Two times a day (BID) | INTRAVENOUS | Status: DC
  Administered 2024-01-18: 10 mL via INTRAVENOUS
  Administered 2024-01-18: 3 mL via INTRAVENOUS

## 2024-01-18 MED ORDER — SODIUM CHLORIDE 0.9% FLUSH: 3.0000 mL | INTRAVENOUS | Status: DC | PRN

## 2024-01-18 NOTE — Consult Note (Signed)
Consultation  Referring Provider:     Odie Sera, MD Primary Care Physician:  Tresa Garter, MD Primary Gastroenterologist:    Gentry Fitz (previously followed with Dr. Juanda Chance)      Reason for Consultation:     Symptomatic anemia, syncope         HPI:   Breanna [name] is a 89 y.o. female with medical history notable for HTN, diabetes, CKD 3, CVA (on ASA 81 mg), gout, CHF, blindness, sigmoid colectomy 2006, presenting to the ER after developing lightheadedness and episode of syncope with brief LOC.  Her son was at her side and eased her to the floor, and he reports she had lost consciousness for <10 seconds.  Shortly after had 2 episodes of nausea and nonbloody emesis.  No preceding history of nausea/vomiting, and no recent abdominal pain, change in bowel habits, melena, hematochezia.  No prior history of GI bleed.  No prior similar symptoms.  No associated chest pain, SOB, DOE.  Son sitting at bedside today and helps provide history.  Second son also arrived during my evaluation and discussed clinical case with him as well.  Admission evaluation notable for the following: - H/H 7.3/23.8 --> 6.2/19.4.  Currently transfusing RBCs (baseline Hgb ~ 10) - BUN/creatinine 61/1.1 (baseline BUN ~24-22) - Normal lipase, liver enzymes - Troponin 49 --> 51 - Heme positive stool - Folate 18.8.  Iron panel and B12 pending.  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  Last EGD was 04/28/2010 and notable for presbyesophagus dilated with 54 Jerene Dilling, multiple gastric polyps  Past Medical History:  Diagnosis Date   Adrenal adenoma 2006   Boils 2009   Cholelithiasis    asympt. w/normal HIDA 03/2010   CVA (cerebral infarction) 2010   Cerebellar   Diverticulitis    Esophageal spasm 2011   GERD (gastroesophageal reflux disease)    Gout    History of colon polyps    HTN (hypertension)     Hydronephrosis    LEFT/ Surgical intervention   Hyperlipidemia    LBP (low back pain)    Osteoarthritis    Pulmonary HTN (HCC)    Stress    Type II or unspecified type diabetes mellitus without mention of complication, not stated as uncontrolled     Past Surgical History:  Procedure Laterality Date   ABDOMINAL HYSTERECTOMY     APPENDECTOMY     2020   BACK SURGERY     BREAST BIOPSY     RIGHT   CATARACT EXTRACTION, BILATERAL     COLECTOMY  2006   Sigmoid   FOOT SURGERY     BILATERAL   HAMMER TOE SURGERY     INTRAMEDULLARY (IM) NAIL INTERTROCHANTERIC Right 07/21/2020   Procedure: INTRAMEDULLARY (IM) NAIL INTERTROCHANTRIC;  Surgeon: Sheral Apley, MD;  Location: [facility] OR;  Service: Orthopedics;  Laterality: Right;   ROTATOR CUFF REPAIR  2008   RIGHT   TOTAL KNEE ARTHROPLASTY  2003   LEFT   VARICOSE VEIN SURGERY     vein stripping/lower extremities    Family History  Problem Relation Age of Onset   Prostate cancer Father    Hypertension Father    Cancer Father        prostate   Heart disease Mother    Diabetes Mother    Diabetes Other        1st degree relative   Heart disease  Other    Hypertension Other    Prostate cancer Maternal Uncle    Cancer Maternal Uncle        prostate   Breast cancer Daughter    Cancer Daughter        breast   Colon cancer Neg Hx      Social History   Tobacco Use   Smoking status: Never   Smokeless tobacco: Never  Vaping Use   Vaping status: Never Used  Substance Use Topics   Alcohol use: No    Alcohol/week: 0.0 standard drinks of alcohol   Drug use: No    Prior to Admission medications   Medication Sig Start Date End Date Taking? Authorizing Provider  acetaminophen (TYLENOL) 325 MG tablet Take 2 tablets (650 mg total) by mouth every 6 (six) hours as needed for moderate pain or fever. 07/29/20  Yes Lynn Ito, MD  allopurinol (ZYLOPRIM) 100 MG tablet Take 0.5 tablets (50 mg total) by mouth daily. 09/11/23  Yes Plotnikov,  Georgina Quint, MD  amLODipine (NORVASC) 5 MG tablet Take 1 tablet (5 mg total) by mouth daily. 05/14/23  Yes Plotnikov, Georgina Quint, MD  ascorbic acid (VITAMIN C) 500 MG tablet TAKE 1 TABLET BY MOUTH EVERY DAY 02/05/22  Yes Plotnikov, Georgina Quint, MD  ASPIRIN LOW DOSE 81 MG tablet TAKE 1 TABLET BY MOUTH DAILY. SWALLOW WHOLE. 10/01/22  Yes Plotnikov, Georgina Quint, MD  atropine 1 % ophthalmic solution Place 1 drop into both eyes daily. 01/16/21  Yes [provider]  [facility] VITAMIN B12 1000 MCG tablet TAKE 1 TABLET (1,000 MCG TOTAL) BY MOUTH EVERY DAY 05/13/23  Yes Plotnikov, Georgina Quint, MD  diclofenac sodium (VOLTAREN) 1 % GEL Apply 2 g topically 3 (three) times daily. To left shoulder Patient taking differently: Apply 2 g topically daily as needed. To left shoulder 03/03/23  Yes Rai, Ripudeep K, MD  docusate sodium (COLACE) 100 MG capsule TAKE 1 CAPSULE BY MOUTH TWICE A DAY Patient taking differently: Take 100 mg by mouth daily as needed for mild constipation or moderate constipation. 03/25/23  Yes Plotnikov, Georgina Quint, MD  dorzolamide-timolol (COSOPT) 22.3-6.8 MG/ML ophthalmic solution Place 1 drop into both eyes 2 (two) times daily.    Yes [provider]  gabapentin (NEURONTIN) 100 MG capsule Take 1 capsule (100 mg total) by mouth 2 (two) times daily. 08/12/23  Yes Plotnikov, Georgina Quint, MD  latanoprost (XALATAN) 0.005 % ophthalmic solution Place 1 drop into both eyes at bedtime.  04/26/16  Yes [provider]  lidocaine-prilocaine (EMLA) cream Apply 1 application topically as needed. 06/15/21  Yes Stover, Titorya, DPM  losartan (COZAAR) 50 MG tablet Take 50 mg by mouth daily. 01/12/24  Yes [provider]  metFORMIN (GLUCOPHAGE) 500 MG tablet TAKE 1 TABLET BY MOUTH EVERY DAY WITH BREAKFAST 08/04/23  Yes Plotnikov, Georgina Quint, MD  NON FORMULARY Place 10 drops under the tongue daily as needed (Pain). Medication: CBD oil   Yes [provider]  pantoprazole (PROTONIX) 40 MG tablet TAKE  1 TABLET BY MOUTH EVERY DAY 11/14/23  Yes Plotnikov, Georgina Quint, MD  polyethylene glycol (MIRALAX / GLYCOLAX) 17 g packet Take 17 g by mouth daily as needed for mild constipation. 07/29/20  Yes Lynn Ito, MD  sertraline (ZOLOFT) 100 MG tablet TAKE 1 TABLET BY MOUTH EVERY DAY 11/18/23  Yes Plotnikov, Georgina Quint, MD  spironolactone (ALDACTONE) 25 MG tablet TAKE 1 TABLET BY MOUTH ON MONDAYS, [facility] AND FRIDAYS 11/14/23  Yes Plotnikov, Georgina Quint,  MD  Alcohol Swabs (TRUE COMFORT ALCOHOL PREP PADS) 70 % PADS Use as directed 04/26/21   Plotnikov, Georgina Quint, MD  Blood Glucose Calibration (TRUE METRIX LEVEL 1) Low SOLN USE AS DIRECTED 04/20/21   Plotnikov, Georgina Quint, MD  brimonidine (ALPHAGAN) 0.2 % ophthalmic solution Place 1 drop into the right eye 2 (two) times daily. Patient not taking: Reported on 01/17/2024    [provider]  calcium carbonate (OSCAL) 1500 (600 Ca) MG TABS tablet TAKE 1 TABLET BY MOUTH EVERY DAY 03/25/23   Plotnikov, Georgina Quint, MD  Cholecalciferol (VITAMIN D3) 25 MCG (1000 UT) CAPS Take 1 capsule (1,000 Units total) by mouth daily. 04/13/22   Plotnikov, Georgina Quint, MD  feeding supplement, ENSURE ENLIVE, (ENSURE ENLIVE) LIQD Take 237 mLs by mouth daily at 2 PM. 07/30/20   Lynn Ito, MD  glucose blood (GNP TRUE METRIX GLUCOSE STRIPS) test strip Use to check blood sugars twice a day 04/26/21   Plotnikov, Georgina Quint, MD  lidocaine (LIDODERM) 5 % Place 1 patch onto the skin daily. Remove & Discard patch within 12 hours or as directed by MD. Apply to left shoulder Patient not taking: Reported on 01/17/2024 03/03/23   Rai, Delene Ruffini, MD  losartan (COZAAR) 100 MG tablet Take 0.5 tablets (50 mg total) by mouth daily. Patient not taking: Reported on 01/17/2024 08/12/23   Plotnikov, Georgina Quint, MD  mupirocin ointment (BACTROBAN) 2 % On leg wound w/dressing change qd or bid Patient not taking: Reported on 01/17/2024 06/14/23   Plotnikov, Georgina Quint, MD  Netarsudil-Latanoprost 0.02-0.005 % SOLN Apply  to eye.    [provider]  rosuvastatin (CRESTOR) 5 MG tablet Take 5 mg by mouth daily. Patient not taking: Reported on 01/17/2024 11/11/23   [provider]  senna (SENOKOT) 8.6 MG TABS tablet Take 1 tablet (8.6 mg total) by mouth 2 (two) times daily. Patient not taking: Reported on 01/17/2024 07/29/20   Lynn Ito, MD  traMADol (ULTRAM) 50 MG tablet Take 1 tablet (50 mg total) by mouth every 8 (eight) hours as needed for severe pain. Patient not taking: Reported on 01/17/2024 03/03/23   Rai, Delene Ruffini, MD  TRUEplus Lancets 33G MISC USE AS DIRECTED TO CHECK BLOOD SUGAR TWICE A DAY 07/23/22   Plotnikov, Georgina Quint, MD  valACYclovir (VALTREX) 500 MG tablet Take 1 tablet (500 mg total) by mouth daily as needed (for outbreaks). Patient not taking: Reported on 01/17/2024 03/03/23   Cathren Harsh, MD    Current Facility-Administered Medications  Medication Dose Route Frequency Provider Last Rate Last Admin   0.9 %  sodium chloride infusion (Manually program via Guardrails IV Fluids)   Intravenous Once Regalado, Belkys A, MD       acetaminophen (TYLENOL) tablet 650 mg  650 mg Oral Q6H PRN Opyd, Lavone Neri, MD       Or   acetaminophen (TYLENOL) suppository 650 mg  650 mg Rectal Q6H PRN Opyd, Lavone Neri, MD       allopurinol (ZYLOPRIM) tablet 50 mg  50 mg Oral Daily Opyd, Lavone Neri, MD   50 mg at 01/18/24 1110   amLODipine (NORVASC) tablet 5 mg  5 mg Oral Daily Opyd, Lavone Neri, MD   5 mg at 01/18/24 1109   dorzolamide-timolol (COSOPT) 2-0.5 % ophthalmic solution 1 drop  1 drop Both Eyes BID Opyd, Lavone Neri, MD   1 drop at 01/18/24 1124   insulin aspart (novoLOG) injection 0-6 Units  0-6 Units Subcutaneous Q4H Opyd, Marcial Pacas  S, MD       latanoprost (XALATAN) 0.005 % ophthalmic solution 1 drop  1 drop Both Eyes QHS Opyd, Lavone Neri, MD   1 drop at 01/17/24 2349   pantoprazole (PROTONIX) injection 40 mg  40 mg Intravenous Q12H Opyd, Lavone Neri, MD   40 mg at 01/18/24 1110   prochlorperazine  (COMPAZINE) injection 5 mg  5 mg Intravenous Q6H PRN Opyd, Lavone Neri, MD       senna-docusate (Senokot-S) tablet 1 tablet  1 tablet Oral QHS PRN Opyd, Lavone Neri, MD       sertraline (ZOLOFT) tablet 100 mg  100 mg Oral Daily Opyd, Lavone Neri, MD   100 mg at 01/18/24 1109   sodium chloride flush (NS) 0.9 % injection 3 mL  3 mL Intravenous Q12H Opyd, Lavone Neri, MD   3 mL at 01/18/24 1111   sodium chloride flush (NS) 0.9 % injection 3-10 mL  3-10 mL Intravenous Q12H Muhsin Doris V, DO   3 mL at 01/18/24 1111   sodium chloride flush (NS) 0.9 % injection 3-10 mL  3-10 mL Intravenous PRN Sasha Rueth V, DO       Current Outpatient Medications  Medication Sig Dispense Refill   acetaminophen (TYLENOL) 325 MG tablet Take 2 tablets (650 mg total) by mouth every 6 (six) hours as needed for moderate pain or fever.     allopurinol (ZYLOPRIM) 100 MG tablet Take 0.5 tablets (50 mg total) by mouth daily. 90 tablet 1   amLODipine (NORVASC) 5 MG tablet Take 1 tablet (5 mg total) by mouth daily. 90 tablet 3   ascorbic acid (VITAMIN C) 500 MG tablet TAKE 1 TABLET BY MOUTH EVERY DAY 30 tablet 11   ASPIRIN LOW DOSE 81 MG tablet TAKE 1 TABLET BY MOUTH DAILY. SWALLOW WHOLE. 90 tablet 3   atropine 1 % ophthalmic solution Place 1 drop into both eyes daily.     [facility] VITAMIN B12 1000 MCG tablet TAKE 1 TABLET (1,000 MCG TOTAL) BY MOUTH EVERY DAY 90 tablet 3   diclofenac sodium (VOLTAREN) 1 % GEL Apply 2 g topically 3 (three) times daily. To left shoulder (Patient taking differently: Apply 2 g topically daily as needed. To left shoulder) 200 g 3   docusate sodium (COLACE) 100 MG capsule TAKE 1 CAPSULE BY MOUTH TWICE A DAY (Patient taking differently: Take 100 mg by mouth daily as needed for mild constipation or moderate constipation.) 180 capsule 3   dorzolamide-timolol (COSOPT) 22.3-6.8 MG/ML ophthalmic solution Place 1 drop into both eyes 2 (two) times daily.      gabapentin (NEURONTIN) 100 MG capsule Take 1 capsule  (100 mg total) by mouth 2 (two) times daily. 180 capsule 5   latanoprost (XALATAN) 0.005 % ophthalmic solution Place 1 drop into both eyes at bedtime.      lidocaine-prilocaine (EMLA) cream Apply 1 application topically as needed. 30 g 0   losartan (COZAAR) 50 MG tablet Take 50 mg by mouth daily.     metFORMIN (GLUCOPHAGE) 500 MG tablet TAKE 1 TABLET BY MOUTH EVERY DAY WITH BREAKFAST 90 tablet 1   NON FORMULARY Place 10 drops under the tongue daily as needed (Pain). Medication: CBD oil     pantoprazole (PROTONIX) 40 MG tablet TAKE 1 TABLET BY MOUTH EVERY DAY 90 tablet 3   polyethylene glycol (MIRALAX / GLYCOLAX) 17 g packet Take 17 g by mouth daily as needed for mild constipation. 14 each 0   sertraline (ZOLOFT) 100 MG tablet  TAKE 1 TABLET BY MOUTH EVERY DAY 90 tablet 1   spironolactone (ALDACTONE) 25 MG tablet TAKE 1 TABLET BY MOUTH ON MONDAYS, WEDNESDAYS AND FRIDAYS 36 tablet 4   Alcohol Swabs (TRUE COMFORT ALCOHOL PREP PADS) 70 % PADS Use as directed 300 each 1   Blood Glucose Calibration (TRUE METRIX LEVEL 1) Low SOLN USE AS DIRECTED 3 each 3   brimonidine (ALPHAGAN) 0.2 % ophthalmic solution Place 1 drop into the right eye 2 (two) times daily. (Patient not taking: Reported on 01/17/2024)     calcium carbonate (OSCAL) 1500 (600 Ca) MG TABS tablet TAKE 1 TABLET BY MOUTH EVERY DAY 90 tablet 3   Cholecalciferol (VITAMIN D3) 25 MCG (1000 UT) CAPS Take 1 capsule (1,000 Units total) by mouth daily. 90 capsule 3   feeding supplement, ENSURE ENLIVE, (ENSURE ENLIVE) LIQD Take 237 mLs by mouth daily at 2 PM. 237 mL 12   glucose blood (GNP TRUE METRIX GLUCOSE STRIPS) test strip Use to check blood sugars twice a day 200 each 2   lidocaine (LIDODERM) 5 % Place 1 patch onto the skin daily. Remove & Discard patch within 12 hours or as directed by MD. Apply to left shoulder (Patient not taking: Reported on 01/17/2024) 30 patch 0   losartan (COZAAR) 100 MG tablet Take 0.5 tablets (50 mg total) by mouth daily.  (Patient not taking: Reported on 01/17/2024)     mupirocin ointment (BACTROBAN) 2 % On leg wound w/dressing change qd or bid (Patient not taking: Reported on 01/17/2024) 30 g 0   Netarsudil-Latanoprost 0.02-0.005 % SOLN Apply to eye.     rosuvastatin (CRESTOR) 5 MG tablet Take 5 mg by mouth daily. (Patient not taking: Reported on 01/17/2024)     senna (SENOKOT) 8.6 MG TABS tablet Take 1 tablet (8.6 mg total) by mouth 2 (two) times daily. (Patient not taking: Reported on 01/17/2024) 120 tablet 0   traMADol (ULTRAM) 50 MG tablet Take 1 tablet (50 mg total) by mouth every 8 (eight) hours as needed for severe pain. (Patient not taking: Reported on 01/17/2024) 30 tablet 0   TRUEplus Lancets 33G MISC USE AS DIRECTED TO CHECK BLOOD SUGAR TWICE A DAY 200 each 1   valACYclovir (VALTREX) 500 MG tablet Take 1 tablet (500 mg total) by mouth daily as needed (for outbreaks). (Patient not taking: Reported on 01/17/2024)     Facility-Administered Medications Ordered in Other Encounters  Medication Dose Route Frequency Provider Last Rate Last Admin   regadenoson (LEXISCAN) injection SOLN 0.4 mg  0.4 mg Intravenous Once Meriam Sprague, MD       technetium tetrofosmin (TC-MYOVIEW) injection 31.1 millicurie  31.1 millicurie Intravenous Once PRN Meriam Sprague, MD        Allergies as of 01/17/2024 - Review Complete 01/17/2024  Allergen Reaction Noted   Verapamil Shortness Of Breath 01/06/2009   Aspirin Itching    Atenolol  01/02/2010   Codeine Itching 04/13/2015   Codeine sulfate     Hydrochlorothiazide  01/02/2010   Hydrocodone  06/04/2016   Ibuprofen     Lisinopril  11/16/2008   Onion Other (See Comments) 04/13/2015   Shellfish allergy Swelling 07/21/2020   Statins  04/11/2023   Valsartan Itching    Adhesive  [tape] Rash 04/13/2015   Other Rash 04/14/2021     Review of Systems:    As per HPI, otherwise negative    Physical Exam:  Vital signs in last 24 hours: Temp:  [97.7 F (36.5  C)-98.8 F (  37.1 C)] 97.9 F (36.6 C) (01/25 1232) Pulse Rate:  [53-67] 53 (01/25 1232) Resp:  [12-28] 16 (01/25 1232) BP: (148-181)/(63-89) 165/82 (01/25 1232) SpO2:  [98 %-100 %] 100 % (01/25 1232) Weight:  [51.3 kg] 51.3 kg (01/24 1653)   General:   Pleasant female in NAD Lungs:  Respirations even and unlabored. Lungs clear to auscultation bilaterally.   No wheezes, crackles, or rhonchi.  Heart:  Regular rate and rhythm; no MRG Abdomen:  Soft, nondistended, nontender. Normal bowel sounds. No appreciable masses or hepatomegaly.  Psych:  Alert and cooperative. Normal affect.  LAB RESULTS: Recent Labs    01/17/24 1730 01/18/24 0408  WBC 8.4 6.1  HGB 7.3* 6.2*  HCT 23.8* 19.4*  PLT 105* 91*   BMET Recent Labs    01/17/24 1730 01/18/24 0408  NA 141 142  K 3.9 4.1  CL 109 112*  CO2 23 22  GLUCOSE 148* 128*  BUN 61* 64*  CREATININE 1.12* 0.94  CALCIUM 9.0 8.6*   LFT Recent Labs    01/17/24 1730  PROT 6.4*  ALBUMIN 3.1*  AST 16  ALT 8  ALKPHOS 57  BILITOT 0.4   PT/INR No results for input(s): "LABPROT", "INR" in the last 72 hours.  STUDIES: DG Chest Portable 1 View Result Date: 01/17/2024 CLINICAL DATA:  Fever.  Vomiting and diarrhea for 3 days.  Syncope EXAM: PORTABLE CHEST 1 VIEW COMPARISON:  X-ray 02/28/2023. FINDINGS: Hyperinflation. No consolidation, pneumothorax or effusion. No edema. Enlarged cardiopericardial silhouette tortuous aorta, similar to previous. Osteopenia. IMPRESSION: Hyperinflation.  Enlarged heart.  No consolidation. Electronically Signed   By: Karen Kays M.D.   On: 01/17/2024 17:50       Impression / Plan:   1) Symptomatic anemia 2) Acute blood loss anemia 3) Syncope 4) Heme positive stool 89 year old female with sudden onset lightheadedness and syncope with brief episode of LOC (no fall/trauma), with admission evaluation notable for acute blood loss anemia with elevated BUN/creatinine ratio, FOBT positive stool, suspicious for  occult UGI bleed.  She is otherwise without overt bleeding.  I discussed diagnostic and treatment options with the patient and her 2 sons at length today.  Based on clinical presentation, advanced age, will elect to start with EGD, and if unrevealing, then consider bowel prep and colonoscopy.  - Clears today with n.p.o. at midnight - EGD tomorrow for diagnostic and therapeutic intent - RBC transfusion now with posttransfusion CBC check.  I suspect she will need at least 2 units - Protonix 40 mg IV twice daily - Hold aspirin - Gentle IV fluids - Follow-up on pending iron studies  5) Diverticulosis 6) History of sigmoid colectomy As above, no overt bleeding, so lower suspicion for diverticular source.  Will continue monitoring.  The indications, risks, and benefits of EGD were explained to the patient and family in detail. Risks include but are not limited to bleeding, perforation, adverse reaction to medications, and cardiopulmonary compromise. Sequelae include but are not limited to the possibility of surgery, hospitalization, and mortality. The patient verbalized understanding and wished to proceed. All questions answered.   Doristine Locks, DO, [facility]    LOS: 0 days   Shellia Cleverly  01/18/2024, 12:52 PM

## 2024-01-18 NOTE — Plan of Care (Signed)
  Problem: Education: Goal: Ability to describe self-care measures that may prevent or decrease complications (Diabetes Survival Skills Education) will improve Outcome: Progressing Goal: Individualized Educational Video(s) Outcome: Progressing   Problem: Coping: Goal: Ability to adjust to condition or change in health will improve Outcome: Progressing   Problem: Fluid Volume: Goal: Ability to maintain a balanced intake and output will improve Outcome: Progressing   Problem: Health Behavior/Discharge Planning: Goal: Ability to identify and utilize available resources and services will improve Outcome: Progressing Goal: Ability to manage health-related needs will improve Outcome: Progressing   Problem: Metabolic: Goal: Ability to maintain appropriate glucose levels will improve Outcome: Progressing   Problem: Nutritional: Goal: Maintenance of adequate nutrition will improve Outcome: Progressing Goal: Progress toward achieving an optimal weight will improve Outcome: Progressing   Problem: Skin Integrity: Goal: Risk for impaired skin integrity will decrease Outcome: Progressing   Problem: Tissue Perfusion: Goal: Adequacy of tissue perfusion will improve Outcome: Progressing   Problem: Education: Goal: Knowledge of condition and prescribed therapy will improve Outcome: Progressing   Problem: Cardiac: Goal: Will achieve and/or maintain adequate cardiac output Outcome: Progressing   Problem: Physical Regulation: Goal: Complications related to the disease process, condition or treatment will be avoided or minimized Outcome: Progressing   Problem: Education: Goal: Knowledge of General Education information will improve Description: Including pain rating scale, medication(s)/side effects and non-pharmacologic comfort measures Outcome: Progressing   Problem: Health Behavior/Discharge Planning: Goal: Ability to manage health-related needs will improve Outcome:  Progressing   Problem: Clinical Measurements: Goal: Ability to maintain clinical measurements within normal limits will improve Outcome: Progressing Goal: Will remain free from infection Outcome: Progressing Goal: Diagnostic test results will improve Outcome: Progressing Goal: Respiratory complications will improve Outcome: Progressing Goal: Cardiovascular complication will be avoided Outcome: Progressing   Problem: Activity: Goal: Risk for activity intolerance will decrease Outcome: Progressing   Problem: Nutrition: Goal: Adequate nutrition will be maintained Outcome: Progressing   Problem: Coping: Goal: Level of anxiety will decrease Outcome: Progressing   Problem: Elimination: Goal: Will not experience complications related to bowel motility Outcome: Progressing Goal: Will not experience complications related to urinary retention Outcome: Progressing   Problem: Pain Managment: Goal: General experience of comfort will improve and/or be controlled Outcome: Progressing   Problem: Safety: Goal: Ability to remain free from injury will improve Outcome: Progressing   Problem: Skin Integrity: Goal: Risk for impaired skin integrity will decrease Outcome: Progressing

## 2024-01-18 NOTE — Progress Notes (Signed)
PROGRESS NOTE    Breanna [name]  WUJ:[id number] DOB: 1931-04-02 DOA: 01/17/2024 PCP: Tresa Garter, MD   Brief Narrative: 89 year old with past medical history significant for hypertension, diabetes type 2, CKD 3B, history of CVA, gout presented with loss of appetite, chills, lightheadedness and syncope.  Evaluation in the ED patient was found to have a hemoglobin of 7.3 troponin of 49 occult blood positive, melena noted on DRE   Assessment & Plan:   Principal Problem:   Syncope Active Problems:   DM2 (diabetes mellitus, type 2) (HCC)   Essential hypertension   History of cardioembolic cerebrovascular accident (CVA)   Diarrhea   Normocytic anemia   Elevated troponin   Stage 3b chronic kidney disease (CKD) (HCC)   Thrombocytopenia (HCC)   1-Syncope -Likely in setting of hypovolemia, low hb.  -Plan to transfuse 2 units PRBC.  -ECHO: Moderate mitral valve regurgitation, no aortic stenosis. Diastolic Dysfunction grade 2, EF 65 %  Acute blood loss anemia, thrombocytopenia Melena -Presents with Hb 7---65 -She will received 2 units PRBC today.  -GI have been consulted.  -PPI BID -Anemia panel not diagnostic.  -Endoscopy tomorrow.   Hypertension -Continue with Norvasc.   Chronic diastolic heart failure -Will give lasix in between blood transfusion.  -Hold  spironolactone. She is not taking Cozaar/   CKD 3B -Cr baseline 1.0--1.3 -stable monitor.    Diabetes type 2 -Hold Metformin.  -SSI   HO of CVA Hold aspirin in setting GI bleed.   Evaded troponins In setting of anemia ECHO normal.    Estimated body mass index is 20.67 kg/m as calculated from the following:   Height as of this encounter: 5\' 2"  (1.575 m).   Weight as of this encounter: 51.3 kg.   DVT prophylaxis: SCD Code Status: Full code Family Communication: Son who was at bedside Disposition Plan:  Status is: Observation The patient remains OBS appropriate and will d/c before 2  midnights.    Consultants:  GI  Procedures:  ECHO  Antimicrobials:    Subjective: She is alert, she denies abdominal pain, unknown for how long she has been having melena.  She is blind  Objective: Vitals:   01/18/24 0400 01/18/24 0406 01/18/24 0711 01/18/24 0732  BP: (!) 163/73  (!) 166/79 (!) 164/85  Pulse: 61  (!) 59 60  Resp: (!) 21  12 20   Temp:  98.3 F (36.8 C) 98.6 F (37 C) 98.1 F (36.7 C)  TempSrc:  Oral Oral   SpO2: 98%  99% 99%  Weight:      Height:       No intake or output data in the 24 hours ending 01/18/24 0801 Filed Weights   01/17/24 1653  Weight: 51.3 kg    Examination:  General exam: Appears calm and comfortable  Respiratory system: Clear to auscultation. Respiratory effort normal. Cardiovascular system: S1 & S2 heard, RRR. No JVD, murmurs, rubs, gallops or clicks. No pedal edema. Gastrointestinal system: Abdomen is nondistended, soft and nontender. No organomegaly or masses felt. Normal bowel sounds heard. Central nervous system: Alert and oriented.  Extremities: Symmetric 5 x 5 power.   Data Reviewed: I have personally reviewed following labs and imaging studies  CBC: Recent Labs  Lab 01/17/24 1730 01/18/24 0408  WBC 8.4 6.1  NEUTROABS 7.1  --   HGB 7.3* 6.2*  HCT 23.8* 19.4*  MCV 88.8 87.4  PLT 105* 91*   Basic Metabolic Panel: Recent Labs  Lab 01/17/24 1730 01/18/24 0408  NA  141 142  K 3.9 4.1  CL 109 112*  CO2 23 22  GLUCOSE 148* 128*  BUN 61* 64*  CREATININE 1.12* 0.94  CALCIUM 9.0 8.6*  MG  --  2.1   GFR: Estimated Creatinine Clearance: 31.5 mL/min (by C-G formula based on SCr of 0.94 mg/dL). Liver Function Tests: Recent Labs  Lab 01/17/24 1730  AST 16  ALT 8  ALKPHOS 57  BILITOT 0.4  PROT 6.4*  ALBUMIN 3.1*   Recent Labs  Lab 01/17/24 1730  LIPASE 30   No results for input(s): "AMMONIA" in the last 168 hours. Coagulation Profile: No results for input(s): "INR", "PROTIME" in the last 168  hours. Cardiac Enzymes: No results for input(s): "CKTOTAL", "CKMB", "CKMBINDEX", "TROPONINI" in the last 168 hours. BNP (last 3 results) No results for input(s): "PROBNP" in the last 8760 hours. HbA1C: No results for input(s): "HGBA1C" in the last 72 hours. CBG: Recent Labs  Lab 01/17/24 2257 01/18/24 0017 01/18/24 0400  GLUCAP 144* 123* 105*   Lipid Profile: No results for input(s): "CHOL", "HDL", "LDLCALC", "TRIG", "CHOLHDL", "LDLDIRECT" in the last 72 hours. Thyroid Function Tests: No results for input(s): "TSH", "T4TOTAL", "FREET4", "T3FREE", "THYROIDAB" in the last 72 hours. Anemia Panel: Recent Labs    01/18/24 0408  FOLATE 18.8  RETICCTPCT 1.8   Sepsis Labs: No results for input(s): "PROCALCITON", "LATICACIDVEN" in the last 168 hours.  Recent Results (from the past 240 hours)  Resp panel by RT-PCR (RSV, Flu A&B, Covid) Anterior Nasal Swab     Status: None   Collection Time: 01/17/24  5:20 PM   Specimen: Anterior Nasal Swab  Result Value Ref Range Status   SARS Coronavirus 2 by RT PCR NEGATIVE NEGATIVE Final    Comment: (NOTE) SARS-CoV-2 target nucleic acids are NOT DETECTED.  The SARS-CoV-2 RNA is generally detectable in upper respiratory specimens during the acute phase of infection. The lowest concentration of SARS-CoV-2 viral copies this assay can detect is 138 copies/mL. A negative result does not preclude SARS-Cov-2 infection and should not be used as the sole basis for treatment or other patient management decisions. A negative result may occur with  improper specimen collection/handling, submission of specimen other than nasopharyngeal swab, presence of viral mutation(s) within the areas targeted by this assay, and inadequate number of viral copies(<138 copies/mL). A negative result must be combined with clinical observations, patient history, and epidemiological information. The expected result is Negative.  Fact Sheet for Patients:   BloggerCourse.com  Fact Sheet for Healthcare Providers:  SeriousBroker.it  This test is no t yet approved or cleared by the [place] FDA and  has been authorized for detection and/or diagnosis of SARS-CoV-2 by FDA under an Emergency Use Authorization (EUA). This EUA will remain  in effect (meaning this test can be used) for the duration of the COVID-19 declaration under Section 564(b)(1) of the Act, 21 U.S.C.section 360bbb-3(b)(1), unless the authorization is terminated  or revoked sooner.       Influenza A by PCR NEGATIVE NEGATIVE Final   Influenza B by PCR NEGATIVE NEGATIVE Final    Comment: (NOTE) The Xpert Xpress SARS-CoV-2/FLU/RSV plus assay is intended as an aid in the diagnosis of influenza from Nasopharyngeal swab specimens and should not be used as a sole basis for treatment. Nasal washings and aspirates are unacceptable for Xpert Xpress SARS-CoV-2/FLU/RSV testing.  Fact Sheet for Patients: BloggerCourse.com  Fact Sheet for Healthcare Providers: SeriousBroker.it  This test is not yet approved or cleared by the [place]  FDA and has been authorized for detection and/or diagnosis of SARS-CoV-2 by FDA under an Emergency Use Authorization (EUA). This EUA will remain in effect (meaning this test can be used) for the duration of the COVID-19 declaration under Section 564(b)(1) of the Act, 21 U.S.C. section 360bbb-3(b)(1), unless the authorization is terminated or revoked.     Resp Syncytial Virus by PCR NEGATIVE NEGATIVE Final    Comment: (NOTE) Fact Sheet for Patients: BloggerCourse.com  Fact Sheet for Healthcare Providers: SeriousBroker.it  This test is not yet approved or cleared by the [place] FDA and has been authorized for detection and/or diagnosis of SARS-CoV-2 by FDA under an Emergency Use  Authorization (EUA). This EUA will remain in effect (meaning this test can be used) for the duration of the COVID-19 declaration under Section 564(b)(1) of the Act, 21 U.S.C. section 360bbb-3(b)(1), unless the authorization is terminated or revoked.  Performed at [facility], [address], [place], [place] [address]          Radiology Studies: DG Chest Portable 1 View Result Date: 01/17/2024 CLINICAL DATA:  Fever.  Vomiting and diarrhea for 3 days.  Syncope EXAM: PORTABLE CHEST 1 VIEW COMPARISON:  X-ray 02/28/2023. FINDINGS: Hyperinflation. No consolidation, pneumothorax or effusion. No edema. Enlarged cardiopericardial silhouette tortuous aorta, similar to previous. Osteopenia. IMPRESSION: Hyperinflation.  Enlarged heart.  No consolidation. Electronically Signed   By: Karen Kays M.D.   On: 01/17/2024 17:50        Scheduled Meds:  sodium chloride   Intravenous Once   allopurinol  50 mg Oral Daily   amLODipine  5 mg Oral Daily   dorzolamide-timolol  1 drop Both Eyes BID   insulin aspart  0-6 Units Subcutaneous Q4H   latanoprost  1 drop Both Eyes QHS   pantoprazole (PROTONIX) IV  40 mg Intravenous Q12H   sertraline  100 mg Oral Daily   sodium chloride flush  3 mL Intravenous Q12H   Continuous Infusions:   LOS: 0 days    Time spent: 35 minutes    Deandra Gadson A Donte Kary, MD [facility]   If 7PM-7AM, please contact night-coverage [email or web address]  01/18/2024, 8:01 AM

## 2024-01-18 NOTE — H&P (View-Only) (Signed)
Consultation  Referring Provider:     Odie Sera, MD Primary Care Physician:  Tresa Garter, MD Primary Gastroenterologist:    Gentry Fitz (previously followed with Dr. Juanda Chance)      Reason for Consultation:     Symptomatic anemia, syncope         HPI:   Breanna [name] is a 89 y.o. female with medical history notable for HTN, diabetes, CKD 3, CVA (on ASA 81 mg), gout, CHF, blindness, sigmoid colectomy 2006, presenting to the ER after developing lightheadedness and episode of syncope with brief LOC.  Her son was at her side and eased her to the floor, and he reports she had lost consciousness for <10 seconds.  Shortly after had 2 episodes of nausea and nonbloody emesis.  No preceding history of nausea/vomiting, and no recent abdominal pain, change in bowel habits, melena, hematochezia.  No prior history of GI bleed.  No prior similar symptoms.  No associated chest pain, SOB, DOE.  Son sitting at bedside today and helps provide history.  Second son also arrived during my evaluation and discussed clinical case with him as well.  Admission evaluation notable for the following: - H/H 7.3/23.8 --> 6.2/19.4.  Currently transfusing RBCs (baseline Hgb ~ 10) - BUN/creatinine 61/1.1 (baseline BUN ~24-22) - Normal lipase, liver enzymes - Troponin 49 --> 51 - Heme positive stool - Folate 18.8.  Iron panel and B12 pending.  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  Last EGD was 04/28/2010 and notable for presbyesophagus dilated with 54 Jerene Dilling, multiple gastric polyps  Past Medical History:  Diagnosis Date   Adrenal adenoma 2006   Boils 2009   Cholelithiasis    asympt. w/normal HIDA 03/2010   CVA (cerebral infarction) 2010   Cerebellar   Diverticulitis    Esophageal spasm 2011   GERD (gastroesophageal reflux disease)    Gout    History of colon polyps    HTN (hypertension)     Hydronephrosis    LEFT/ Surgical intervention   Hyperlipidemia    LBP (low back pain)    Osteoarthritis    Pulmonary HTN (HCC)    Stress    Type II or unspecified type diabetes mellitus without mention of complication, not stated as uncontrolled     Past Surgical History:  Procedure Laterality Date   ABDOMINAL HYSTERECTOMY     APPENDECTOMY     2020   BACK SURGERY     BREAST BIOPSY     RIGHT   CATARACT EXTRACTION, BILATERAL     COLECTOMY  2006   Sigmoid   FOOT SURGERY     BILATERAL   HAMMER TOE SURGERY     INTRAMEDULLARY (IM) NAIL INTERTROCHANTERIC Right 07/21/2020   Procedure: INTRAMEDULLARY (IM) NAIL INTERTROCHANTRIC;  Surgeon: Sheral Apley, MD;  Location: [facility] OR;  Service: Orthopedics;  Laterality: Right;   ROTATOR CUFF REPAIR  2008   RIGHT   TOTAL KNEE ARTHROPLASTY  2003   LEFT   VARICOSE VEIN SURGERY     vein stripping/lower extremities    Family History  Problem Relation Age of Onset   Prostate cancer Father    Hypertension Father    Cancer Father        prostate   Heart disease Mother    Diabetes Mother    Diabetes Other        1st degree relative   Heart disease  Other    Hypertension Other    Prostate cancer Maternal Uncle    Cancer Maternal Uncle        prostate   Breast cancer Daughter    Cancer Daughter        breast   Colon cancer Neg Hx      Social History   Tobacco Use   Smoking status: Never   Smokeless tobacco: Never  Vaping Use   Vaping status: Never Used  Substance Use Topics   Alcohol use: No    Alcohol/week: 0.0 standard drinks of alcohol   Drug use: No    Prior to Admission medications   Medication Sig Start Date End Date Taking? Authorizing Provider  acetaminophen (TYLENOL) 325 MG tablet Take 2 tablets (650 mg total) by mouth every 6 (six) hours as needed for moderate pain or fever. 07/29/20  Yes Lynn Ito, MD  allopurinol (ZYLOPRIM) 100 MG tablet Take 0.5 tablets (50 mg total) by mouth daily. 09/11/23  Yes Plotnikov,  Georgina Quint, MD  amLODipine (NORVASC) 5 MG tablet Take 1 tablet (5 mg total) by mouth daily. 05/14/23  Yes Plotnikov, Georgina Quint, MD  ascorbic acid (VITAMIN C) 500 MG tablet TAKE 1 TABLET BY MOUTH EVERY DAY 02/05/22  Yes Plotnikov, Georgina Quint, MD  ASPIRIN LOW DOSE 81 MG tablet TAKE 1 TABLET BY MOUTH DAILY. SWALLOW WHOLE. 10/01/22  Yes Plotnikov, Georgina Quint, MD  atropine 1 % ophthalmic solution Place 1 drop into both eyes daily. 01/16/21  Yes [provider]  [facility] VITAMIN B12 1000 MCG tablet TAKE 1 TABLET (1,000 MCG TOTAL) BY MOUTH EVERY DAY 05/13/23  Yes Plotnikov, Georgina Quint, MD  diclofenac sodium (VOLTAREN) 1 % GEL Apply 2 g topically 3 (three) times daily. To left shoulder Patient taking differently: Apply 2 g topically daily as needed. To left shoulder 03/03/23  Yes Rai, Ripudeep K, MD  docusate sodium (COLACE) 100 MG capsule TAKE 1 CAPSULE BY MOUTH TWICE A DAY Patient taking differently: Take 100 mg by mouth daily as needed for mild constipation or moderate constipation. 03/25/23  Yes Plotnikov, Georgina Quint, MD  dorzolamide-timolol (COSOPT) 22.3-6.8 MG/ML ophthalmic solution Place 1 drop into both eyes 2 (two) times daily.    Yes [provider]  gabapentin (NEURONTIN) 100 MG capsule Take 1 capsule (100 mg total) by mouth 2 (two) times daily. 08/12/23  Yes Plotnikov, Georgina Quint, MD  latanoprost (XALATAN) 0.005 % ophthalmic solution Place 1 drop into both eyes at bedtime.  04/26/16  Yes [provider]  lidocaine-prilocaine (EMLA) cream Apply 1 application topically as needed. 06/15/21  Yes Stover, Titorya, DPM  losartan (COZAAR) 50 MG tablet Take 50 mg by mouth daily. 01/12/24  Yes [provider]  metFORMIN (GLUCOPHAGE) 500 MG tablet TAKE 1 TABLET BY MOUTH EVERY DAY WITH BREAKFAST 08/04/23  Yes Plotnikov, Georgina Quint, MD  NON FORMULARY Place 10 drops under the tongue daily as needed (Pain). Medication: CBD oil   Yes [provider]  pantoprazole (PROTONIX) 40 MG tablet TAKE  1 TABLET BY MOUTH EVERY DAY 11/14/23  Yes Plotnikov, Georgina Quint, MD  polyethylene glycol (MIRALAX / GLYCOLAX) 17 g packet Take 17 g by mouth daily as needed for mild constipation. 07/29/20  Yes Lynn Ito, MD  sertraline (ZOLOFT) 100 MG tablet TAKE 1 TABLET BY MOUTH EVERY DAY 11/18/23  Yes Plotnikov, Georgina Quint, MD  spironolactone (ALDACTONE) 25 MG tablet TAKE 1 TABLET BY MOUTH ON MONDAYS, [facility] AND FRIDAYS 11/14/23  Yes Plotnikov, Georgina Quint,  MD  Alcohol Swabs (TRUE COMFORT ALCOHOL PREP PADS) 70 % PADS Use as directed 04/26/21   Plotnikov, Georgina Quint, MD  Blood Glucose Calibration (TRUE METRIX LEVEL 1) Low SOLN USE AS DIRECTED 04/20/21   Plotnikov, Georgina Quint, MD  brimonidine (ALPHAGAN) 0.2 % ophthalmic solution Place 1 drop into the right eye 2 (two) times daily. Patient not taking: Reported on 01/17/2024    [provider]  calcium carbonate (OSCAL) 1500 (600 Ca) MG TABS tablet TAKE 1 TABLET BY MOUTH EVERY DAY 03/25/23   Plotnikov, Georgina Quint, MD  Cholecalciferol (VITAMIN D3) 25 MCG (1000 UT) CAPS Take 1 capsule (1,000 Units total) by mouth daily. 04/13/22   Plotnikov, Georgina Quint, MD  feeding supplement, ENSURE ENLIVE, (ENSURE ENLIVE) LIQD Take 237 mLs by mouth daily at 2 PM. 07/30/20   Lynn Ito, MD  glucose blood (GNP TRUE METRIX GLUCOSE STRIPS) test strip Use to check blood sugars twice a day 04/26/21   Plotnikov, Georgina Quint, MD  lidocaine (LIDODERM) 5 % Place 1 patch onto the skin daily. Remove & Discard patch within 12 hours or as directed by MD. Apply to left shoulder Patient not taking: Reported on 01/17/2024 03/03/23   Rai, Delene Ruffini, MD  losartan (COZAAR) 100 MG tablet Take 0.5 tablets (50 mg total) by mouth daily. Patient not taking: Reported on 01/17/2024 08/12/23   Plotnikov, Georgina Quint, MD  mupirocin ointment (BACTROBAN) 2 % On leg wound w/dressing change qd or bid Patient not taking: Reported on 01/17/2024 06/14/23   Plotnikov, Georgina Quint, MD  Netarsudil-Latanoprost 0.02-0.005 % SOLN Apply  to eye.    [provider]  rosuvastatin (CRESTOR) 5 MG tablet Take 5 mg by mouth daily. Patient not taking: Reported on 01/17/2024 11/11/23   [provider]  senna (SENOKOT) 8.6 MG TABS tablet Take 1 tablet (8.6 mg total) by mouth 2 (two) times daily. Patient not taking: Reported on 01/17/2024 07/29/20   Lynn Ito, MD  traMADol (ULTRAM) 50 MG tablet Take 1 tablet (50 mg total) by mouth every 8 (eight) hours as needed for severe pain. Patient not taking: Reported on 01/17/2024 03/03/23   Rai, Delene Ruffini, MD  TRUEplus Lancets 33G MISC USE AS DIRECTED TO CHECK BLOOD SUGAR TWICE A DAY 07/23/22   Plotnikov, Georgina Quint, MD  valACYclovir (VALTREX) 500 MG tablet Take 1 tablet (500 mg total) by mouth daily as needed (for outbreaks). Patient not taking: Reported on 01/17/2024 03/03/23   Cathren Harsh, MD    Current Facility-Administered Medications  Medication Dose Route Frequency Provider Last Rate Last Admin   0.9 %  sodium chloride infusion (Manually program via Guardrails IV Fluids)   Intravenous Once Regalado, Belkys A, MD       acetaminophen (TYLENOL) tablet 650 mg  650 mg Oral Q6H PRN Opyd, Lavone Neri, MD       Or   acetaminophen (TYLENOL) suppository 650 mg  650 mg Rectal Q6H PRN Opyd, Lavone Neri, MD       allopurinol (ZYLOPRIM) tablet 50 mg  50 mg Oral Daily Opyd, Lavone Neri, MD   50 mg at 01/18/24 1110   amLODipine (NORVASC) tablet 5 mg  5 mg Oral Daily Opyd, Lavone Neri, MD   5 mg at 01/18/24 1109   dorzolamide-timolol (COSOPT) 2-0.5 % ophthalmic solution 1 drop  1 drop Both Eyes BID Opyd, Lavone Neri, MD   1 drop at 01/18/24 1124   insulin aspart (novoLOG) injection 0-6 Units  0-6 Units Subcutaneous Q4H Opyd, Marcial Pacas  S, MD       latanoprost (XALATAN) 0.005 % ophthalmic solution 1 drop  1 drop Both Eyes QHS Opyd, Lavone Neri, MD   1 drop at 01/17/24 2349   pantoprazole (PROTONIX) injection 40 mg  40 mg Intravenous Q12H Opyd, Lavone Neri, MD   40 mg at 01/18/24 1110   prochlorperazine  (COMPAZINE) injection 5 mg  5 mg Intravenous Q6H PRN Opyd, Lavone Neri, MD       senna-docusate (Senokot-S) tablet 1 tablet  1 tablet Oral QHS PRN Opyd, Lavone Neri, MD       sertraline (ZOLOFT) tablet 100 mg  100 mg Oral Daily Opyd, Lavone Neri, MD   100 mg at 01/18/24 1109   sodium chloride flush (NS) 0.9 % injection 3 mL  3 mL Intravenous Q12H Opyd, Lavone Neri, MD   3 mL at 01/18/24 1111   sodium chloride flush (NS) 0.9 % injection 3-10 mL  3-10 mL Intravenous Q12H Muhsin Doris V, DO   3 mL at 01/18/24 1111   sodium chloride flush (NS) 0.9 % injection 3-10 mL  3-10 mL Intravenous PRN Sasha Rueth V, DO       Current Outpatient Medications  Medication Sig Dispense Refill   acetaminophen (TYLENOL) 325 MG tablet Take 2 tablets (650 mg total) by mouth every 6 (six) hours as needed for moderate pain or fever.     allopurinol (ZYLOPRIM) 100 MG tablet Take 0.5 tablets (50 mg total) by mouth daily. 90 tablet 1   amLODipine (NORVASC) 5 MG tablet Take 1 tablet (5 mg total) by mouth daily. 90 tablet 3   ascorbic acid (VITAMIN C) 500 MG tablet TAKE 1 TABLET BY MOUTH EVERY DAY 30 tablet 11   ASPIRIN LOW DOSE 81 MG tablet TAKE 1 TABLET BY MOUTH DAILY. SWALLOW WHOLE. 90 tablet 3   atropine 1 % ophthalmic solution Place 1 drop into both eyes daily.     [facility] VITAMIN B12 1000 MCG tablet TAKE 1 TABLET (1,000 MCG TOTAL) BY MOUTH EVERY DAY 90 tablet 3   diclofenac sodium (VOLTAREN) 1 % GEL Apply 2 g topically 3 (three) times daily. To left shoulder (Patient taking differently: Apply 2 g topically daily as needed. To left shoulder) 200 g 3   docusate sodium (COLACE) 100 MG capsule TAKE 1 CAPSULE BY MOUTH TWICE A DAY (Patient taking differently: Take 100 mg by mouth daily as needed for mild constipation or moderate constipation.) 180 capsule 3   dorzolamide-timolol (COSOPT) 22.3-6.8 MG/ML ophthalmic solution Place 1 drop into both eyes 2 (two) times daily.      gabapentin (NEURONTIN) 100 MG capsule Take 1 capsule  (100 mg total) by mouth 2 (two) times daily. 180 capsule 5   latanoprost (XALATAN) 0.005 % ophthalmic solution Place 1 drop into both eyes at bedtime.      lidocaine-prilocaine (EMLA) cream Apply 1 application topically as needed. 30 g 0   losartan (COZAAR) 50 MG tablet Take 50 mg by mouth daily.     metFORMIN (GLUCOPHAGE) 500 MG tablet TAKE 1 TABLET BY MOUTH EVERY DAY WITH BREAKFAST 90 tablet 1   NON FORMULARY Place 10 drops under the tongue daily as needed (Pain). Medication: CBD oil     pantoprazole (PROTONIX) 40 MG tablet TAKE 1 TABLET BY MOUTH EVERY DAY 90 tablet 3   polyethylene glycol (MIRALAX / GLYCOLAX) 17 g packet Take 17 g by mouth daily as needed for mild constipation. 14 each 0   sertraline (ZOLOFT) 100 MG tablet  TAKE 1 TABLET BY MOUTH EVERY DAY 90 tablet 1   spironolactone (ALDACTONE) 25 MG tablet TAKE 1 TABLET BY MOUTH ON MONDAYS, WEDNESDAYS AND FRIDAYS 36 tablet 4   Alcohol Swabs (TRUE COMFORT ALCOHOL PREP PADS) 70 % PADS Use as directed 300 each 1   Blood Glucose Calibration (TRUE METRIX LEVEL 1) Low SOLN USE AS DIRECTED 3 each 3   brimonidine (ALPHAGAN) 0.2 % ophthalmic solution Place 1 drop into the right eye 2 (two) times daily. (Patient not taking: Reported on 01/17/2024)     calcium carbonate (OSCAL) 1500 (600 Ca) MG TABS tablet TAKE 1 TABLET BY MOUTH EVERY DAY 90 tablet 3   Cholecalciferol (VITAMIN D3) 25 MCG (1000 UT) CAPS Take 1 capsule (1,000 Units total) by mouth daily. 90 capsule 3   feeding supplement, ENSURE ENLIVE, (ENSURE ENLIVE) LIQD Take 237 mLs by mouth daily at 2 PM. 237 mL 12   glucose blood (GNP TRUE METRIX GLUCOSE STRIPS) test strip Use to check blood sugars twice a day 200 each 2   lidocaine (LIDODERM) 5 % Place 1 patch onto the skin daily. Remove & Discard patch within 12 hours or as directed by MD. Apply to left shoulder (Patient not taking: Reported on 01/17/2024) 30 patch 0   losartan (COZAAR) 100 MG tablet Take 0.5 tablets (50 mg total) by mouth daily.  (Patient not taking: Reported on 01/17/2024)     mupirocin ointment (BACTROBAN) 2 % On leg wound w/dressing change qd or bid (Patient not taking: Reported on 01/17/2024) 30 g 0   Netarsudil-Latanoprost 0.02-0.005 % SOLN Apply to eye.     rosuvastatin (CRESTOR) 5 MG tablet Take 5 mg by mouth daily. (Patient not taking: Reported on 01/17/2024)     senna (SENOKOT) 8.6 MG TABS tablet Take 1 tablet (8.6 mg total) by mouth 2 (two) times daily. (Patient not taking: Reported on 01/17/2024) 120 tablet 0   traMADol (ULTRAM) 50 MG tablet Take 1 tablet (50 mg total) by mouth every 8 (eight) hours as needed for severe pain. (Patient not taking: Reported on 01/17/2024) 30 tablet 0   TRUEplus Lancets 33G MISC USE AS DIRECTED TO CHECK BLOOD SUGAR TWICE A DAY 200 each 1   valACYclovir (VALTREX) 500 MG tablet Take 1 tablet (500 mg total) by mouth daily as needed (for outbreaks). (Patient not taking: Reported on 01/17/2024)     Facility-Administered Medications Ordered in Other Encounters  Medication Dose Route Frequency Provider Last Rate Last Admin   regadenoson (LEXISCAN) injection SOLN 0.4 mg  0.4 mg Intravenous Once Meriam Sprague, MD       technetium tetrofosmin (TC-MYOVIEW) injection 31.1 millicurie  31.1 millicurie Intravenous Once PRN Meriam Sprague, MD        Allergies as of 01/17/2024 - Review Complete 01/17/2024  Allergen Reaction Noted   Verapamil Shortness Of Breath 01/06/2009   Aspirin Itching    Atenolol  01/02/2010   Codeine Itching 04/13/2015   Codeine sulfate     Hydrochlorothiazide  01/02/2010   Hydrocodone  06/04/2016   Ibuprofen     Lisinopril  11/16/2008   Onion Other (See Comments) 04/13/2015   Shellfish allergy Swelling 07/21/2020   Statins  04/11/2023   Valsartan Itching    Adhesive  [tape] Rash 04/13/2015   Other Rash 04/14/2021     Review of Systems:    As per HPI, otherwise negative    Physical Exam:  Vital signs in last 24 hours: Temp:  [97.7 F (36.5  C)-98.8 F (  37.1 C)] 97.9 F (36.6 C) (01/25 1232) Pulse Rate:  [53-67] 53 (01/25 1232) Resp:  [12-28] 16 (01/25 1232) BP: (148-181)/(63-89) 165/82 (01/25 1232) SpO2:  [98 %-100 %] 100 % (01/25 1232) Weight:  [51.3 kg] 51.3 kg (01/24 1653)   General:   Pleasant female in NAD Lungs:  Respirations even and unlabored. Lungs clear to auscultation bilaterally.   No wheezes, crackles, or rhonchi.  Heart:  Regular rate and rhythm; no MRG Abdomen:  Soft, nondistended, nontender. Normal bowel sounds. No appreciable masses or hepatomegaly.  Psych:  Alert and cooperative. Normal affect.  LAB RESULTS: Recent Labs    01/17/24 1730 01/18/24 0408  WBC 8.4 6.1  HGB 7.3* 6.2*  HCT 23.8* 19.4*  PLT 105* 91*   BMET Recent Labs    01/17/24 1730 01/18/24 0408  NA 141 142  K 3.9 4.1  CL 109 112*  CO2 23 22  GLUCOSE 148* 128*  BUN 61* 64*  CREATININE 1.12* 0.94  CALCIUM 9.0 8.6*   LFT Recent Labs    01/17/24 1730  PROT 6.4*  ALBUMIN 3.1*  AST 16  ALT 8  ALKPHOS 57  BILITOT 0.4   PT/INR No results for input(s): "LABPROT", "INR" in the last 72 hours.  STUDIES: DG Chest Portable 1 View Result Date: 01/17/2024 CLINICAL DATA:  Fever.  Vomiting and diarrhea for 3 days.  Syncope EXAM: PORTABLE CHEST 1 VIEW COMPARISON:  X-ray 02/28/2023. FINDINGS: Hyperinflation. No consolidation, pneumothorax or effusion. No edema. Enlarged cardiopericardial silhouette tortuous aorta, similar to previous. Osteopenia. IMPRESSION: Hyperinflation.  Enlarged heart.  No consolidation. Electronically Signed   By: Karen Kays M.D.   On: 01/17/2024 17:50       Impression / Plan:   1) Symptomatic anemia 2) Acute blood loss anemia 3) Syncope 4) Heme positive stool 89 year old female with sudden onset lightheadedness and syncope with brief episode of LOC (no fall/trauma), with admission evaluation notable for acute blood loss anemia with elevated BUN/creatinine ratio, FOBT positive stool, suspicious for  occult UGI bleed.  She is otherwise without overt bleeding.  I discussed diagnostic and treatment options with the patient and her 2 sons at length today.  Based on clinical presentation, advanced age, will elect to start with EGD, and if unrevealing, then consider bowel prep and colonoscopy.  - Clears today with n.p.o. at midnight - EGD tomorrow for diagnostic and therapeutic intent - RBC transfusion now with posttransfusion CBC check.  I suspect she will need at least 2 units - Protonix 40 mg IV twice daily - Hold aspirin - Gentle IV fluids - Follow-up on pending iron studies  5) Diverticulosis 6) History of sigmoid colectomy As above, no overt bleeding, so lower suspicion for diverticular source.  Will continue monitoring.  The indications, risks, and benefits of EGD were explained to the patient and family in detail. Risks include but are not limited to bleeding, perforation, adverse reaction to medications, and cardiopulmonary compromise. Sequelae include but are not limited to the possibility of surgery, hospitalization, and mortality. The patient verbalized understanding and wished to proceed. All questions answered.   Doristine Locks, DO, [facility]    LOS: 0 days   Shellia Cleverly  01/18/2024, 12:52 PM

## 2024-01-19 ENCOUNTER — Observation Stay (HOSPITAL_COMMUNITY): Payer: Medicare PPO | Admitting: Anesthesiology

## 2024-01-19 ENCOUNTER — Encounter (HOSPITAL_COMMUNITY)
Admission: EM | Disposition: A | Discharge status: Home Health | Payer: Self-pay | Source: Home / Self Care | Attending: Family Medicine

## 2024-01-19 ENCOUNTER — Encounter (HOSPITAL_COMMUNITY): Payer: Self-pay | Admitting: Family Medicine

## 2024-01-19 DIAGNOSIS — K317 Polyp of stomach and duodenum: Secondary | ICD-10-CM | POA: Diagnosis not present

## 2024-01-19 DIAGNOSIS — I129 Hypertensive chronic kidney disease with stage 1 through stage 4 chronic kidney disease, or unspecified chronic kidney disease: Secondary | ICD-10-CM | POA: Diagnosis not present

## 2024-01-19 DIAGNOSIS — D649 Anemia, unspecified: Secondary | ICD-10-CM | POA: Diagnosis not present

## 2024-01-19 DIAGNOSIS — E1122 Type 2 diabetes mellitus with diabetic chronic kidney disease: Secondary | ICD-10-CM | POA: Diagnosis not present

## 2024-01-19 DIAGNOSIS — N1832 Chronic kidney disease, stage 3b: Secondary | ICD-10-CM | POA: Diagnosis not present

## 2024-01-19 DIAGNOSIS — D509 Iron deficiency anemia, unspecified: Secondary | ICD-10-CM | POA: Diagnosis not present

## 2024-01-19 DIAGNOSIS — K5731 Diverticulosis of large intestine without perforation or abscess with bleeding: Secondary | ICD-10-CM | POA: Diagnosis not present

## 2024-01-19 DIAGNOSIS — R195 Other fecal abnormalities: Secondary | ICD-10-CM | POA: Diagnosis not present

## 2024-01-19 DIAGNOSIS — D62 Acute posthemorrhagic anemia: Secondary | ICD-10-CM | POA: Diagnosis not present

## 2024-01-19 DIAGNOSIS — R55 Syncope and collapse: Secondary | ICD-10-CM | POA: Diagnosis not present

## 2024-01-19 HISTORY — PX: ESOPHAGOGASTRODUODENOSCOPY: SHX5428

## 2024-01-19 LAB — CBC
HCT: 23.8 % — ABNORMAL LOW (ref 36.0–46.0)
HCT: 27.3 % — ABNORMAL LOW (ref 36.0–46.0)
Hemoglobin: 7.6 g/dL — ABNORMAL LOW (ref 12.0–15.0)
Hemoglobin: 9 g/dL — ABNORMAL LOW (ref 12.0–15.0)
MCH: 28.4 pg (ref 26.0–34.0)
MCH: 28 pg (ref 26.0–34.0)
MCHC: 31.9 g/dL (ref 30.0–36.0)
MCHC: 33 g/dL (ref 30.0–36.0)
MCV: 88.8 fL (ref 80.0–100.0)
MCV: 84.8 fL (ref 80.0–100.0)
Platelets: 87 10*3/uL — ABNORMAL LOW (ref 150–400)
Platelets: 63 10*3/uL — ABNORMAL LOW (ref 150–400)
RBC: 2.68 MIL/uL — ABNORMAL LOW (ref 3.87–5.11)
RBC: 3.22 MIL/uL — ABNORMAL LOW (ref 3.87–5.11)
RDW: 16.9 % — ABNORMAL HIGH (ref 11.5–15.5)
RDW: 16.6 % — ABNORMAL HIGH (ref 11.5–15.5)
WBC: 6.2 10*3/uL (ref 4.0–10.5)
WBC: 5.3 10*3/uL (ref 4.0–10.5)
nRBC: 0.3 % — ABNORMAL HIGH (ref 0.0–0.2)
nRBC: 0.6 % — ABNORMAL HIGH (ref 0.0–0.2)

## 2024-01-19 LAB — BASIC METABOLIC PANEL
Anion gap: 9 (ref 5–15)
BUN: 65 mg/dL — ABNORMAL HIGH (ref 8–23)
CO2: 20 mmol/L — ABNORMAL LOW (ref 22–32)
Calcium: 8.7 mg/dL — ABNORMAL LOW (ref 8.9–10.3)
Chloride: 114 mmol/L — ABNORMAL HIGH (ref 98–111)
Creatinine, Ser: 0.95 mg/dL (ref 0.44–1.00)
GFR, Estimated: 57 mL/min — ABNORMAL LOW (ref >60)
Glucose, Bld: 111 mg/dL — ABNORMAL HIGH (ref 70–99)
Potassium: 4.1 mmol/L (ref 3.5–5.1)
Sodium: 143 mmol/L (ref 135–145)

## 2024-01-19 LAB — GLUCOSE, CAPILLARY
Glucose-Capillary: 118 mg/dL — ABNORMAL HIGH (ref 70–99)
Glucose-Capillary: 109 mg/dL — ABNORMAL HIGH (ref 70–99)
Glucose-Capillary: 106 mg/dL — ABNORMAL HIGH (ref 70–99)
Glucose-Capillary: 101 mg/dL — ABNORMAL HIGH (ref 70–99)
Glucose-Capillary: 104 mg/dL — ABNORMAL HIGH (ref 70–99)
Glucose-Capillary: 117 mg/dL — ABNORMAL HIGH (ref 70–99)

## 2024-01-19 LAB — MAGNESIUM: Magnesium: 1.9 mg/dL (ref 1.7–2.4)

## 2024-01-19 LAB — PHOSPHORUS: Phosphorus: 3.7 mg/dL (ref 2.5–4.6)

## 2024-01-19 SURGERY — EGD (ESOPHAGOGASTRODUODENOSCOPY)
Anesthesia: Monitor Anesthesia Care | Laterality: Left

## 2024-01-19 MED ORDER — PEG-KCL-NACL-NASULF-NA ASC-C 100 G PO SOLR
0.5000 | Freq: Once | ORAL | Status: AC
  Administered 2024-01-19: 100 g via ORAL
  Filled 2024-01-19: qty 1

## 2024-01-19 MED ORDER — PROPOFOL 500 MG/50ML IV EMUL
INTRAVENOUS | Status: DC | PRN
  Administered 2024-01-19: 70 ug/kg/min via INTRAVENOUS

## 2024-01-19 MED ORDER — SODIUM CHLORIDE 0.9% FLUSH: 3.0000 mL | INTRAVENOUS | Status: DC | PRN

## 2024-01-19 MED ORDER — SODIUM CHLORIDE 0.9% FLUSH
3.0000 mL | Freq: Two times a day (BID) | INTRAVENOUS | Status: DC
  Administered 2024-01-19: 3 mL via INTRAVENOUS
  Administered 2024-01-20: 10 mL via INTRAVENOUS

## 2024-01-19 MED ORDER — PEG-KCL-NACL-NASULF-NA ASC-C 100 G PO SOLR
0.5000 | Freq: Once | ORAL | Status: AC
  Administered 2024-01-19: 100 g via ORAL

## 2024-01-19 MED ORDER — PROPOFOL 500 MG/50ML IV EMUL
INTRAVENOUS | Status: AC
  Filled 2024-01-19: qty 50

## 2024-01-19 MED ORDER — PANTOPRAZOLE SODIUM 40 MG PO TBEC
40.0000 mg | DELAYED_RELEASE_TABLET | Freq: Every day | ORAL | Status: DC
  Administered 2024-01-20: 40 mg via ORAL
  Filled 2024-01-19: qty 1

## 2024-01-19 MED ORDER — PEG-KCL-NACL-NASULF-NA ASC-C 100 G PO SOLR: 1.0000 | Freq: Once | ORAL | Status: DC

## 2024-01-19 MED ORDER — PROPOFOL 10 MG/ML IV BOLUS
INTRAVENOUS | Status: DC | PRN
  Administered 2024-01-19: 30 mg via INTRAVENOUS

## 2024-01-19 MED ORDER — SODIUM CHLORIDE 0.9 % IV SOLN: INTRAVENOUS | Status: DC | PRN

## 2024-01-19 NOTE — Plan of Care (Signed)
  Problem: Education: Goal: Ability to describe self-care measures that may prevent or decrease complications (Diabetes Survival Skills Education) will improve Outcome: Progressing Goal: Individualized Educational Video(s) Outcome: Progressing   Problem: Coping: Goal: Ability to adjust to condition or change in health will improve Outcome: Progressing   Problem: Fluid Volume: Goal: Ability to maintain a balanced intake and output will improve Outcome: Progressing   Problem: Health Behavior/Discharge Planning: Goal: Ability to identify and utilize available resources and services will improve Outcome: Progressing Goal: Ability to manage health-related needs will improve Outcome: Progressing   Problem: Metabolic: Goal: Ability to maintain appropriate glucose levels will improve Outcome: Progressing   Problem: Nutritional: Goal: Maintenance of adequate nutrition will improve Outcome: Progressing Goal: Progress toward achieving an optimal weight will improve Outcome: Progressing   Problem: Skin Integrity: Goal: Risk for impaired skin integrity will decrease Outcome: Progressing   Problem: Tissue Perfusion: Goal: Adequacy of tissue perfusion will improve Outcome: Progressing   Problem: Education: Goal: Knowledge of condition and prescribed therapy will improve Outcome: Progressing   Problem: Cardiac: Goal: Will achieve and/or maintain adequate cardiac output Outcome: Progressing   Problem: Physical Regulation: Goal: Complications related to the disease process, condition or treatment will be avoided or minimized Outcome: Progressing   Problem: Education: Goal: Knowledge of General Education information will improve Description: Including pain rating scale, medication(s)/side effects and non-pharmacologic comfort measures Outcome: Progressing   Problem: Health Behavior/Discharge Planning: Goal: Ability to manage health-related needs will improve Outcome:  Progressing   Problem: Clinical Measurements: Goal: Ability to maintain clinical measurements within normal limits will improve Outcome: Progressing Goal: Will remain free from infection Outcome: Progressing Goal: Diagnostic test results will improve Outcome: Progressing Goal: Respiratory complications will improve Outcome: Progressing Goal: Cardiovascular complication will be avoided Outcome: Progressing   Problem: Activity: Goal: Risk for activity intolerance will decrease Outcome: Progressing   Problem: Nutrition: Goal: Adequate nutrition will be maintained Outcome: Progressing   Problem: Coping: Goal: Level of anxiety will decrease Outcome: Progressing   Problem: Elimination: Goal: Will not experience complications related to bowel motility Outcome: Progressing Goal: Will not experience complications related to urinary retention Outcome: Progressing   Problem: Pain Managment: Goal: General experience of comfort will improve and/or be controlled Outcome: Progressing   Problem: Safety: Goal: Ability to remain free from injury will improve Outcome: Progressing   Problem: Skin Integrity: Goal: Risk for impaired skin integrity will decrease Outcome: Progressing

## 2024-01-19 NOTE — Care Management Obs Status (Signed)
MEDICARE OBSERVATION STATUS NOTIFICATION   Patient Details  Name: Breanna [name] MRN: [id number] Date of Birth: February 04, 1931   Medicare Observation Status Notification Given:  Yes, patient legally blind. Expressed understanding at bedside.      Coralyn Helling, LCSW 01/19/2024, 9:31 AM

## 2024-01-19 NOTE — Transfer of Care (Signed)
Immediate Anesthesia Transfer of Care Note  Patient: Steele Berg  Procedure(s) Performed: ESOPHAGOGASTRODUODENOSCOPY (EGD) (Left)  Patient Location: PACU  Anesthesia Type:MAC  Level of Consciousness: drowsy and patient cooperative  Airway & Oxygen Therapy: Patient Spontanous Breathing and Patient connected to face mask oxygen  Post-op Assessment: Report given to RN and Post -op Vital signs reviewed and stable  Post vital signs: Reviewed and stable  Last Vitals:  Vitals Value Taken Time  BP 130/55 01/19/24 0757  Temp 36.8 C 01/19/24 0757  Pulse 64 01/19/24 0759  Resp 28 01/19/24 0759  SpO2 100 % 01/19/24 0759  Vitals shown include unfiled device data.  Last Pain:  Vitals:   01/19/24 0757  TempSrc:   PainSc: Asleep         Complications: No notable events documented.

## 2024-01-19 NOTE — Interval H&P Note (Signed)
History and Physical Interval Note: Transfused 2 units RBCs yesterday with repeat H/H 7.6/23.8 today.  Still with elevated BUN/creatinine ratio at 65/0.95.  Remainder of labs notable for ferritin 28, iron 44, TIBC 283, sat 16% with normal B12 and folate.  Plan to move forward with EGD for diagnostic and therapeutic intent today.  01/19/2024 7:25 AM  Breanna [name]  has presented today for surgery, with the diagnosis of Acute blood loss anemia, FOBT+ stool, symptomatic anemia.  The various methods of treatment have been discussed with the patient and family. After consideration of risks, benefits and other options for treatment, the patient has consented to  Procedure(s): ESOPHAGOGASTRODUODENOSCOPY (EGD) (Left) as a surgical intervention.  The patient's history has been reviewed, patient examined, no change in status, stable for surgery.  I have reviewed the patient's chart and labs.  Questions were answered to the patient's satisfaction.     Verlin Dike Akili Cuda

## 2024-01-19 NOTE — Anesthesia Postprocedure Evaluation (Signed)
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  Last Vitals:  Vitals:   01/19/24 0815 01/19/24 0825  BP: 128/66 130/75  Pulse: 60 (!) 59  Resp: 18 (!) 22  Temp:    SpO2: 98% 96%    Last Pain:  Vitals:   01/19/24 0825  TempSrc:   PainSc: 0-No pain                 Shelbia Scinto,W. EDMOND

## 2024-01-19 NOTE — Progress Notes (Signed)
PROGRESS NOTE    Breanna [name]  ZOX:[id number] DOB: 05-Jul-1931 DOA: 01/17/2024 PCP: Tresa Garter, MD   Brief Narrative: 89 year old with past medical history significant for hypertension, diabetes type 2, CKD 3B, history of CVA, gout presented with loss of appetite, chills, lightheadedness and syncope.  Evaluation in the ED patient was found to have a hemoglobin of 7.3 troponin of 49 occult blood positive, melena noted on DRE   Assessment & Plan:   Principal Problem:   Syncope Active Problems:   DM2 (diabetes mellitus, type 2) (HCC)   Essential hypertension   History of cardioembolic cerebrovascular accident (CVA)   Diarrhea   Normocytic anemia   Elevated troponin   Stage 3b chronic kidney disease (CKD) (HCC)   Thrombocytopenia (HCC)   ABLA (acute blood loss anemia)   Heme positive stool   Symptomatic anemia   1-Syncope -Likely in setting of hypovolemia, low hb.  -Plan to transfuse 2 units PRBC.  -ECHO: Moderate mitral valve regurgitation, no aortic stenosis. Diastolic Dysfunction grade 2, EF 65 %  Acute blood loss anemia, thrombocytopenia Melena -Presents with Hb 7---65 -She received 1 unit PRBC>  -GI have been consulted.  -PPI BID -Anemia panel not diagnostic.  -Endoscopy normal esophagus, multiple gastric polyps, normal mucosa was found in the entire stomach.  No areas of active bleeding. Plan for colonoscopy tomorrow Hb  increased to 9  Hypertension -Continue with Norvasc.   Chronic diastolic heart failure -Will give lasix in between blood transfusion.  -Hold  spironolactone. She is not taking Cozaar/   CKD 3B -Cr baseline 1.0--1.3 -stable monitor.    Diabetes type 2 -Hold Metformin.  -SSI   HO of CVA Hold aspirin in setting GI bleed.   Evaded troponins In setting of anemia ECHO normal.    Estimated body mass index is 20.08 kg/m as calculated from the following:   Height as of this encounter: 5\' 2"  (1.575 m).   Weight as of this  encounter: 49.8 kg.   DVT prophylaxis: SCD Code Status: Full code Family Communication: Son who was at bedside Disposition Plan:  Status is: Observation The patient remains OBS appropriate and will d/c before 2 midnights.    Consultants:  GI  Procedures:  ECHO  Antimicrobials:    Subjective: She is doing ok, denies pain.   Objective: Vitals:   01/19/24 0800 01/19/24 0815 01/19/24 0825 01/19/24 1340  BP: (!) 144/72 128/66 130/75 (!) 134/58  Pulse: 64 60 (!) 59 65  Resp: (!) 21 18 (!) 22   Temp:    98 F (36.7 C)  TempSrc:    Oral  SpO2: 100% 98% 96% 98%  Weight:      Height:        Intake/Output Summary (Last 24 hours) at 01/19/2024 1624 Last data filed at 01/19/2024 1204 Gross per 24 hour  Intake 510 ml  Output 1550 ml  Net -1040 ml   Filed Weights   01/17/24 1653 01/18/24 1334 01/19/24 0652  Weight: 51.3 kg 48.9 kg 49.8 kg    Examination:  General exam: NAD  Respiratory system: CTA Cardiovascular system: S 1, s 2 RRR Gastrointestinal system: BS present, soft, nt Central nervous system: alert  Extremities: Symmetric 5 x 5 power.   Data Reviewed: I have personally reviewed following labs and imaging studies  CBC: Recent Labs  Lab 01/17/24 1730 01/18/24 0408 01/18/24 1420 01/18/24 2101 01/19/24 0544 01/19/24 1244  WBC 8.4 6.1 6.7 5.8 6.2 5.3  NEUTROABS 7.1  --   --   --   --   --  HGB 7.3* 6.2* 8.0* 7.4* 7.6* 9.0*  HCT 23.8* 19.4* 25.0* 23.2* 23.8* 27.3*  MCV 88.8 87.4 88.7 88.5 88.8 84.8  PLT 105* 91* 92* 83* 87* 63*   Basic Metabolic Panel: Recent Labs  Lab 01/17/24 1730 01/18/24 0408 01/18/24 1816 01/19/24 0544  NA 141 142  --  143  K 3.9 4.1 3.1* 4.1  CL 109 112*  --  114*  CO2 23 22  --  20*  GLUCOSE 148* 128*  --  111*  BUN 61* 64*  --  65*  CREATININE 1.12* 0.94  --  0.95  CALCIUM 9.0 8.6*  --  8.7*  MG  --  2.1  --  1.9  PHOS  --   --   --  3.7   GFR: Estimated Creatinine Clearance: 30.9 mL/min (by C-G formula  based on SCr of 0.95 mg/dL). Liver Function Tests: Recent Labs  Lab 01/17/24 1730  AST 16  ALT 8  ALKPHOS 57  BILITOT 0.4  PROT 6.4*  ALBUMIN 3.1*   Recent Labs  Lab 01/17/24 1730  LIPASE 30   No results for input(s): "AMMONIA" in the last 168 hours. Coagulation Profile: No results for input(s): "INR", "PROTIME" in the last 168 hours. Cardiac Enzymes: No results for input(s): "CKTOTAL", "CKMB", "CKMBINDEX", "TROPONINI" in the last 168 hours. BNP (last 3 results) No results for input(s): "PROBNP" in the last 8760 hours. HbA1C: No results for input(s): "HGBA1C" in the last 72 hours. CBG: Recent Labs  Lab 01/18/24 2336 01/19/24 0405 01/19/24 0801 01/19/24 0850 01/19/24 1201  GLUCAP 127* 118* 109* 106* 101*   Lipid Profile: No results for input(s): "CHOL", "HDL", "LDLCALC", "TRIG", "CHOLHDL", "LDLDIRECT" in the last 72 hours. Thyroid Function Tests: No results for input(s): "TSH", "T4TOTAL", "FREET4", "T3FREE", "THYROIDAB" in the last 72 hours. Anemia Panel: Recent Labs    01/18/24 0408  VITAMINB12 1,264*  FOLATE 18.8  FERRITIN 28  TIBC 283  IRON 44  RETICCTPCT 1.8   Sepsis Labs: No results for input(s): "PROCALCITON", "LATICACIDVEN" in the last 168 hours.  Recent Results (from the past 240 hours)  Resp panel by RT-PCR (RSV, Flu A&B, Covid) Anterior Nasal Swab     Status: None   Collection Time: 01/17/24  5:20 PM   Specimen: Anterior Nasal Swab  Result Value Ref Range Status   SARS Coronavirus 2 by RT PCR NEGATIVE NEGATIVE Final    Comment: (NOTE) SARS-CoV-2 target nucleic acids are NOT DETECTED.  The SARS-CoV-2 RNA is generally detectable in upper respiratory specimens during the acute phase of infection. The lowest concentration of SARS-CoV-2 viral copies this assay can detect is 138 copies/mL. A negative result does not preclude SARS-Cov-2 infection and should not be used as the sole basis for treatment or other patient management decisions. A  negative result may occur with  improper specimen collection/handling, submission of specimen other than nasopharyngeal swab, presence of viral mutation(s) within the areas targeted by this assay, and inadequate number of viral copies(<138 copies/mL). A negative result must be combined with clinical observations, patient history, and epidemiological information. The expected result is Negative.  Fact Sheet for Patients:  BloggerCourse.com  Fact Sheet for Healthcare Providers:  SeriousBroker.it  This test is no t yet approved or cleared by the [place] FDA and  has been authorized for detection and/or diagnosis of SARS-CoV-2 by FDA under an Emergency Use Authorization (EUA). This EUA will remain  in effect (meaning this test can be used) for the duration of the COVID-19  declaration under Section 564(b)(1) of the Act, 21 U.S.C.section 360bbb-3(b)(1), unless the authorization is terminated  or revoked sooner.       Influenza A by PCR NEGATIVE NEGATIVE Final   Influenza B by PCR NEGATIVE NEGATIVE Final    Comment: (NOTE) The Xpert Xpress SARS-CoV-2/FLU/RSV plus assay is intended as an aid in the diagnosis of influenza from Nasopharyngeal swab specimens and should not be used as a sole basis for treatment. Nasal washings and aspirates are unacceptable for Xpert Xpress SARS-CoV-2/FLU/RSV testing.  Fact Sheet for Patients: BloggerCourse.com  Fact Sheet for Healthcare Providers: SeriousBroker.it  This test is not yet approved or cleared by the [place] FDA and has been authorized for detection and/or diagnosis of SARS-CoV-2 by FDA under an Emergency Use Authorization (EUA). This EUA will remain in effect (meaning this test can be used) for the duration of the COVID-19 declaration under Section 564(b)(1) of the Act, 21 U.S.C. section 360bbb-3(b)(1), unless the authorization  is terminated or revoked.     Resp Syncytial Virus by PCR NEGATIVE NEGATIVE Final    Comment: (NOTE) Fact Sheet for Patients: BloggerCourse.com  Fact Sheet for Healthcare Providers: SeriousBroker.it  This test is not yet approved or cleared by the [place] FDA and has been authorized for detection and/or diagnosis of SARS-CoV-2 by FDA under an Emergency Use Authorization (EUA). This EUA will remain in effect (meaning this test can be used) for the duration of the COVID-19 declaration under Section 564(b)(1) of the Act, 21 U.S.C. section 360bbb-3(b)(1), unless the authorization is terminated or revoked.  Performed at [facility], [address], [place], [place] [address]          Radiology Studies: ECHOCARDIOGRAM COMPLETE Result Date: 01/18/2024    ECHOCARDIOGRAM REPORT   Patient Name:   BLYSS LUGAR Date of Exam: 01/18/2024 Medical Rec #:  [id number]       Height:       62.0 in Accession #:    [id number]      Weight:       113.0 lb Date of Birth:  1931-10-27        BSA:          1.500 m Patient Age:    89 years        BP:           173/89 mmHg Patient Gender: F               HR:           55 bpm. Exam Location:  Inpatient Procedure: 2D Echo, Cardiac Doppler and Color Doppler Indications:    Syncope  History:        Patient has prior history of Echocardiogram examinations, most                 recent 03/01/2023. Arrythmias:Bradycardia; Risk                 Factors:Hypertension and Diabetes.  Sonographer:    Webb Laws Referring Phys: [id number] TIMOTHY S OPYD IMPRESSIONS  1. Left ventricular ejection fraction, by estimation, is 60 to 65%. The left ventricle has normal function. The left ventricle has no regional wall motion abnormalities. There is moderate left ventricular hypertrophy. Left ventricular diastolic parameters are consistent with Grade II diastolic dysfunction (pseudonormalization). Elevated left  atrial pressure. The average left ventricular global longitudinal strain is -20.3 %.  2. Right ventricular systolic function is normal. The right ventricular size is normal. There is moderately  elevated pulmonary artery systolic pressure. The estimated right ventricular systolic pressure is 47.4 mmHg.  3. Left atrial size was severely dilated.  4. The mitral valve is degenerative. Moderate mitral valve regurgitation.  5. Tricuspid valve regurgitation is mild to moderate.  6. The aortic valve is tricuspid. Aortic valve regurgitation is trivial. Aortic valve sclerosis is present, with no evidence of aortic valve stenosis.  7. The inferior vena cava is normal in size with greater than 50% respiratory variability, suggesting right atrial pressure of 3 mmHg. FINDINGS  Left Ventricle: Left ventricular ejection fraction, by estimation, is 60 to 65%. The left ventricle has normal function. The left ventricle has no regional wall motion abnormalities. The average left ventricular global longitudinal strain is -20.3 %. The left ventricular internal cavity size was normal in size. There is moderate left ventricular hypertrophy. Left ventricular diastolic parameters are consistent with Grade II diastolic dysfunction (pseudonormalization). Elevated left atrial pressure. Right Ventricle: The right ventricular size is normal. No increase in right ventricular wall thickness. Right ventricular systolic function is normal. There is moderately elevated pulmonary artery systolic pressure. The tricuspid regurgitant velocity is 3.33 m/s, and with an assumed right atrial pressure of 3 mmHg, the estimated right ventricular systolic pressure is 47.4 mmHg. Left Atrium: Left atrial size was severely dilated. Right Atrium: Right atrial size was normal in size. Pericardium: There is no evidence of pericardial effusion. Mitral Valve: The mitral valve is degenerative in appearance. Moderate mitral valve regurgitation. Tricuspid Valve: The  tricuspid valve is normal in structure. Tricuspid valve regurgitation is mild to moderate. Aortic Valve: The aortic valve is tricuspid. Aortic valve regurgitation is trivial. Aortic regurgitation PHT measures 654 msec. Aortic valve sclerosis is present, with no evidence of aortic valve stenosis. Pulmonic Valve: The pulmonic valve was not well visualized. Pulmonic valve regurgitation is not visualized. Aorta: The aortic root and ascending aorta are structurally normal, with no evidence of dilitation. Venous: The inferior vena cava is normal in size with greater than 50% respiratory variability, suggesting right atrial pressure of 3 mmHg. IAS/Shunts: The interatrial septum was not well visualized.  LEFT VENTRICLE PLAX 2D LVIDd:         3.80 cm     Diastology LVIDs:         2.40 cm     LV e' medial:    3.92 cm/s LV PW:         1.60 cm     LV E/e' medial:  18.6 LV IVS:        1.60 cm     LV e' lateral:   4.46 cm/s LVOT diam:     2.00 cm     LV E/e' lateral: 16.4 LV SV:         64 LV SV Index:   43          2D Longitudinal Strain LVOT Area:     3.14 cm    2D Strain GLS Avg:     -20.3 %  LV Volumes (MOD) LV vol d, MOD A2C: 54.4 ml LV vol d, MOD A4C: 68.2 ml LV vol s, MOD A2C: 21.8 ml LV vol s, MOD A4C: 23.4 ml LV SV MOD A2C:     32.6 ml LV SV MOD A4C:     68.2 ml LV SV MOD BP:      38.2 ml RIGHT VENTRICLE             IVC RV Basal diam:  3.00 cm  IVC diam: 1.50 cm RV S prime:     14.50 cm/s TAPSE (M-mode): 2.9 cm LEFT ATRIUM              Index        RIGHT ATRIUM           Index LA diam:        4.70 cm  3.13 cm/m   RA Area:     10.60 cm LA Vol (A2C):   98.4 ml  65.61 ml/m  RA Volume:   21.30 ml  14.20 ml/m LA Vol (A4C):   120.0 ml 80.01 ml/m LA Biplane Vol: 116.0 ml 77.34 ml/m  AORTIC VALVE LVOT Vmax:   97.30 cm/s LVOT Vmean:  68.000 cm/s LVOT VTI:    0.203 m AI PHT:      654 msec  AORTA Ao Root diam: 3.10 cm Ao Asc diam:  3.20 cm MITRAL VALVE                  TRICUSPID VALVE MV Area (PHT): 3.65 cm       TR Peak  grad:   44.4 mmHg MV Decel Time: 208 msec       TR Vmax:        333.00 cm/s MR Peak grad:    169.5 mmHg MR Mean grad:    108.0 mmHg   SHUNTS MR Vmax:         651.00 cm/s  Systemic VTI:  0.20 m MR Vmean:        495.0 cm/s   Systemic Diam: 2.00 cm MR PISA:         1.01 cm MR PISA Eff ROA: 6 mm MR PISA Radius:  0.40 cm MV E velocity: 73.10 cm/s MV A velocity: 120.00 cm/s MV E/A ratio:  0.61 Epifanio Lesches MD Electronically signed by Epifanio Lesches MD Signature Date/Time: 01/18/2024/1:10:41 PM    Final    DG Chest Portable 1 View Result Date: 01/17/2024 CLINICAL DATA:  Fever.  Vomiting and diarrhea for 3 days.  Syncope EXAM: PORTABLE CHEST 1 VIEW COMPARISON:  X-ray 02/28/2023. FINDINGS: Hyperinflation. No consolidation, pneumothorax or effusion. No edema. Enlarged cardiopericardial silhouette tortuous aorta, similar to previous. Osteopenia. IMPRESSION: Hyperinflation.  Enlarged heart.  No consolidation. Electronically Signed   By: Karen Kays M.D.   On: 01/17/2024 17:50        Scheduled Meds:  sodium chloride   Intravenous Once   allopurinol  50 mg Oral Daily   amLODipine  5 mg Oral Daily   ascorbic acid  500 mg Oral Daily   dorzolamide-timolol  1 drop Both Eyes BID   feeding supplement  1 Container Oral TID BM   gabapentin  100 mg Oral BID   insulin aspart  0-6 Units Subcutaneous Q4H   latanoprost  1 drop Both Eyes QHS   pantoprazole  40 mg Oral Daily   peg 3350 powder  0.5 kit Oral Once   And   peg 3350 powder  0.5 kit Oral Once   sertraline  100 mg Oral Daily   sodium chloride flush  3 mL Intravenous Q12H   sodium chloride flush  3-10 mL Intravenous Q12H   Continuous Infusions:   LOS: 0 days    Time spent: 35 minutes    Danella Philson A Berle Fitz, MD [facility]   If 7PM-7AM, please contact night-coverage [email or web address]  01/19/2024, 4:24 PM

## 2024-01-19 NOTE — Op Note (Signed)
[facility] Patient Name: Breanna [name] Procedure Date: 01/19/2024 MRN: [id number] Attending MD: Doristine Locks , MD, [id number] Date of Birth: 05-Aug-1931 CSN: [id number] Age: 89 Admit Type: Inpatient Procedure:                Upper GI endoscopy Indications:              Acute post hemorrhagic anemia, Heme positive stool,                            Symptomatic anemia with syncopal event Providers:                Doristine Locks, MD, Roselie Awkward, RN, Harrington Challenger,                            Technician Referring MD:              Medicines:                Monitored Anesthesia Care Complications:            No immediate complications. Estimated Blood Loss:     Estimated blood loss: none. Procedure:                Pre-Anesthesia Assessment:                           - Prior to the procedure, a History and Physical                            was performed, and patient medications and                            allergies were reviewed. The patient's tolerance of                            previous anesthesia was also reviewed. The risks                            and benefits of the procedure and the sedation                            options and risks were discussed with the patient.                            All questions were answered, and informed consent                            was obtained. Prior Anticoagulants: The patient has                            taken no anticoagulant or antiplatelet agents                            except for aspirin. ASA Grade Assessment: III - A  patient with severe systemic disease. After                            reviewing the risks and benefits, the patient was                            deemed in satisfactory condition to undergo the                            procedure.                           After obtaining informed consent, the endoscope was                            passed under direct vision.  Throughout the                            procedure, the patient's blood pressure, pulse, and                            oxygen saturations were monitored continuously. The                            [id number]-[id number] ([id number]) Olympus endoscope was introduced                            through the mouth, and advanced to the second part                            of duodenum. The upper GI endoscopy was                            accomplished without difficulty. The patient                            tolerated the procedure well. Scope In: Scope Out: Findings:      The examined esophagus was normal.      Multiple small sessile polyps with no bleeding were found in the gastric       fundus and in the gastric body. These were all benign appearing fundic       gland polyps, and not sampled today.      The mucosa was otherwise normal-appearing throughout the stomach.      The examined duodenum was normal. Impression:               - Normal esophagus.                           - Multiple gastric polyps.                           - Normal mucosa was found in the entire stomach.                           - Normal examined  duodenum.                           - No areas of active bleeding, stigmata of recent                            bleeding, or heme noted on this study.                           - No specimens collected. Moderate Sedation:      Not Applicable - Patient had care per Anesthesia. Recommendation:           - Return patient to hospital ward for ongoing care.                           - Clear liquid diet today.                           - Perform a colonoscopy tomorrow.                           - Continue serial CBC checks with additional blood                            products as needed per protocol.                           - Discussed results with the primary Hospitalist                            team.                           - Discussed results with patient's sons, [name]                             and Amada Jupiter, by phone. Procedure Code(s):        --- Professional ---                           [phone], Esophagogastroduodenoscopy, flexible,                            transoral; diagnostic, including collection of                            specimen(s) by brushing or washing, when performed                            (separate procedure) Diagnosis Code(s):        --- Professional ---                           K31.7, Polyp of stomach and duodenum                           D62, Acute posthemorrhagic anemia  R19.5, Other fecal abnormalities CPT copyright 2022 American [employer] Association. All rights reserved. The codes documented in this report are preliminary and upon coder review may  be revised to meet current compliance requirements. Doristine Locks, MD 01/19/2024 8:05:42 AM Number of Addenda: 0

## 2024-01-19 NOTE — Anesthesia Preprocedure Evaluation (Addendum)
Anesthesia Evaluation  Patient identified by MRN, date of birth, ID band Patient awake    Reviewed: Allergy & Precautions, H&P , NPO status , Patient's Chart, lab work & pertinent test results  Airway Mallampati: II  TM Distance: >3 FB Neck ROM: Full    Dental no notable dental hx. (+) Teeth Intact, Dental Advisory Given   Pulmonary shortness of breath   Pulmonary exam normal breath sounds clear to auscultation       Cardiovascular hypertension, Pt. on medications  Rhythm:Regular Rate:Normal     Neuro/Psych  Headaches   Depression       GI/Hepatic Neg liver ROS,GERD  Medicated,,  Endo/Other  diabetes, Type 2, Oral Hypoglycemic Agents    Renal/GU negative Renal ROS  negative genitourinary   Musculoskeletal  (+) Arthritis , Osteoarthritis,    Abdominal   Peds  Hematology  (+) Blood dyscrasia, anemia   Anesthesia Other Findings   Reproductive/Obstetrics negative OB ROS                             Anesthesia Physical Anesthesia Plan  ASA: 3  Anesthesia Plan: MAC   Post-op Pain Management: Minimal or no pain anticipated   Induction: Intravenous  PONV Risk Score and Plan: 2 and Propofol infusion and Treatment may vary due to age or medical condition  Airway Management Planned: Natural Airway and Simple Face Mask  Additional Equipment:   Intra-op Plan:   Post-operative Plan:   Informed Consent: I have reviewed the patients History and Physical, chart, labs and discussed the procedure including the risks, benefits and alternatives for the proposed anesthesia with the patient or authorized representative who has indicated his/her understanding and acceptance.     Dental advisory given  Plan Discussed with: CRNA  Anesthesia Plan Comments:        Anesthesia Quick Evaluation

## 2024-01-20 ENCOUNTER — Encounter (HOSPITAL_COMMUNITY)
Admission: EM | Disposition: A | Discharge status: Home Health | Payer: Self-pay | Source: Home / Self Care | Attending: Family Medicine

## 2024-01-20 ENCOUNTER — Inpatient Hospital Stay (HOSPITAL_COMMUNITY): Payer: Medicare PPO | Admitting: Anesthesiology

## 2024-01-20 ENCOUNTER — Encounter (HOSPITAL_COMMUNITY): Payer: Self-pay | Admitting: Family Medicine

## 2024-01-20 DIAGNOSIS — E1140 Type 2 diabetes mellitus with diabetic neuropathy, unspecified: Secondary | ICD-10-CM | POA: Diagnosis present

## 2024-01-20 DIAGNOSIS — D62 Acute posthemorrhagic anemia: Secondary | ICD-10-CM

## 2024-01-20 DIAGNOSIS — Z7984 Long term (current) use of oral hypoglycemic drugs: Secondary | ICD-10-CM | POA: Diagnosis not present

## 2024-01-20 DIAGNOSIS — K91840 Postprocedural hemorrhage and hematoma of a digestive system organ or structure following a digestive system procedure: Secondary | ICD-10-CM | POA: Diagnosis not present

## 2024-01-20 DIAGNOSIS — K922 Gastrointestinal hemorrhage, unspecified: Secondary | ICD-10-CM | POA: Diagnosis not present

## 2024-01-20 DIAGNOSIS — N1832 Chronic kidney disease, stage 3b: Secondary | ICD-10-CM | POA: Diagnosis present

## 2024-01-20 DIAGNOSIS — E870 Hyperosmolality and hypernatremia: Secondary | ICD-10-CM | POA: Diagnosis not present

## 2024-01-20 DIAGNOSIS — K5731 Diverticulosis of large intestine without perforation or abscess with bleeding: Secondary | ICD-10-CM

## 2024-01-20 DIAGNOSIS — Y838 Other surgical procedures as the cause of abnormal reaction of the patient, or of later complication, without mention of misadventure at the time of the procedure: Secondary | ICD-10-CM | POA: Diagnosis not present

## 2024-01-20 DIAGNOSIS — D649 Anemia, unspecified: Secondary | ICD-10-CM | POA: Diagnosis not present

## 2024-01-20 DIAGNOSIS — D120 Benign neoplasm of cecum: Secondary | ICD-10-CM | POA: Diagnosis not present

## 2024-01-20 DIAGNOSIS — E119 Type 2 diabetes mellitus without complications: Secondary | ICD-10-CM | POA: Diagnosis not present

## 2024-01-20 DIAGNOSIS — I129 Hypertensive chronic kidney disease with stage 1 through stage 4 chronic kidney disease, or unspecified chronic kidney disease: Secondary | ICD-10-CM

## 2024-01-20 DIAGNOSIS — I272 Pulmonary hypertension, unspecified: Secondary | ICD-10-CM | POA: Diagnosis present

## 2024-01-20 DIAGNOSIS — I130 Hypertensive heart and chronic kidney disease with heart failure and stage 1 through stage 4 chronic kidney disease, or unspecified chronic kidney disease: Secondary | ICD-10-CM | POA: Diagnosis present

## 2024-01-20 DIAGNOSIS — I5032 Chronic diastolic (congestive) heart failure: Secondary | ICD-10-CM | POA: Diagnosis present

## 2024-01-20 DIAGNOSIS — E861 Hypovolemia: Secondary | ICD-10-CM | POA: Diagnosis present

## 2024-01-20 DIAGNOSIS — R55 Syncope and collapse: Secondary | ICD-10-CM | POA: Diagnosis present

## 2024-01-20 DIAGNOSIS — Z9889 Other specified postprocedural states: Secondary | ICD-10-CM | POA: Diagnosis not present

## 2024-01-20 DIAGNOSIS — Z888 Allergy status to other drugs, medicaments and biological substances status: Secondary | ICD-10-CM | POA: Diagnosis not present

## 2024-01-20 DIAGNOSIS — D696 Thrombocytopenia, unspecified: Secondary | ICD-10-CM | POA: Diagnosis present

## 2024-01-20 DIAGNOSIS — E785 Hyperlipidemia, unspecified: Secondary | ICD-10-CM | POA: Diagnosis present

## 2024-01-20 DIAGNOSIS — I051 Rheumatic mitral insufficiency: Secondary | ICD-10-CM | POA: Diagnosis present

## 2024-01-20 DIAGNOSIS — K5730 Diverticulosis of large intestine without perforation or abscess without bleeding: Secondary | ICD-10-CM | POA: Diagnosis not present

## 2024-01-20 DIAGNOSIS — Z1152 Encounter for screening for COVID-19: Secondary | ICD-10-CM | POA: Diagnosis not present

## 2024-01-20 DIAGNOSIS — K921 Melena: Secondary | ICD-10-CM

## 2024-01-20 DIAGNOSIS — R195 Other fecal abnormalities: Secondary | ICD-10-CM

## 2024-01-20 DIAGNOSIS — E876 Hypokalemia: Secondary | ICD-10-CM | POA: Diagnosis not present

## 2024-01-20 DIAGNOSIS — Z886 Allergy status to analgesic agent status: Secondary | ICD-10-CM | POA: Diagnosis not present

## 2024-01-20 DIAGNOSIS — Z7982 Long term (current) use of aspirin: Secondary | ICD-10-CM | POA: Diagnosis not present

## 2024-01-20 DIAGNOSIS — Z885 Allergy status to narcotic agent status: Secondary | ICD-10-CM | POA: Diagnosis not present

## 2024-01-20 DIAGNOSIS — Z681 Body mass index (BMI) 19 or less, adult: Secondary | ICD-10-CM | POA: Diagnosis not present

## 2024-01-20 DIAGNOSIS — D509 Iron deficiency anemia, unspecified: Secondary | ICD-10-CM | POA: Diagnosis not present

## 2024-01-20 DIAGNOSIS — E1122 Type 2 diabetes mellitus with diabetic chronic kidney disease: Secondary | ICD-10-CM | POA: Diagnosis present

## 2024-01-20 DIAGNOSIS — I10 Essential (primary) hypertension: Secondary | ICD-10-CM | POA: Diagnosis not present

## 2024-01-20 DIAGNOSIS — Z8249 Family history of ischemic heart disease and other diseases of the circulatory system: Secondary | ICD-10-CM | POA: Diagnosis not present

## 2024-01-20 DIAGNOSIS — E440 Moderate protein-calorie malnutrition: Secondary | ICD-10-CM | POA: Diagnosis present

## 2024-01-20 HISTORY — PX: COLONOSCOPY: SHX5424

## 2024-01-20 LAB — HEMOGLOBIN AND HEMATOCRIT, BLOOD
HCT: 26.2 % — ABNORMAL LOW (ref 36.0–46.0)
Hemoglobin: 8.3 g/dL — ABNORMAL LOW (ref 12.0–15.0)

## 2024-01-20 LAB — POCT I-STAT, CHEM 8
BUN: 57 mg/dL — ABNORMAL HIGH (ref 8–23)
Calcium, Ion: 1.28 mmol/L (ref 1.15–1.40)
Chloride: 116 mmol/L — ABNORMAL HIGH (ref 98–111)
Creatinine, Ser: 1.4 mg/dL — ABNORMAL HIGH (ref 0.44–1.00)
Glucose, Bld: 116 mg/dL — ABNORMAL HIGH (ref 70–99)
HCT: 24 % — ABNORMAL LOW (ref 36.0–46.0)
Hemoglobin: 8.2 g/dL — ABNORMAL LOW (ref 12.0–15.0)
Potassium: 3.6 mmol/L (ref 3.5–5.1)
Sodium: 151 mmol/L — ABNORMAL HIGH (ref 135–145)
TCO2: 19 mmol/L — ABNORMAL LOW (ref 22–32)

## 2024-01-20 LAB — GLUCOSE, CAPILLARY
Glucose-Capillary: 108 mg/dL — ABNORMAL HIGH (ref 70–99)
Glucose-Capillary: 114 mg/dL — ABNORMAL HIGH (ref 70–99)
Glucose-Capillary: 121 mg/dL — ABNORMAL HIGH (ref 70–99)
Glucose-Capillary: 123 mg/dL — ABNORMAL HIGH (ref 70–99)
Glucose-Capillary: 128 mg/dL — ABNORMAL HIGH (ref 70–99)
Glucose-Capillary: 167 mg/dL — ABNORMAL HIGH (ref 70–99)
Glucose-Capillary: 185 mg/dL — ABNORMAL HIGH (ref 70–99)

## 2024-01-20 LAB — CBC
HCT: 20.4 % — ABNORMAL LOW (ref 36.0–46.0)
HCT: 23.5 % — ABNORMAL LOW (ref 36.0–46.0)
Hemoglobin: 6.4 g/dL — CL (ref 12.0–15.0)
Hemoglobin: 7.5 g/dL — ABNORMAL LOW (ref 12.0–15.0)
MCH: 28.3 pg (ref 26.0–34.0)
MCH: 27.6 pg (ref 26.0–34.0)
MCHC: 31.4 g/dL (ref 30.0–36.0)
MCHC: 31.9 g/dL (ref 30.0–36.0)
MCV: 90.3 fL (ref 80.0–100.0)
MCV: 86.4 fL (ref 80.0–100.0)
Platelets: 97 10*3/uL — ABNORMAL LOW (ref 150–400)
Platelets: 86 10*3/uL — ABNORMAL LOW (ref 150–400)
RBC: 2.26 MIL/uL — ABNORMAL LOW (ref 3.87–5.11)
RBC: 2.72 MIL/uL — ABNORMAL LOW (ref 3.87–5.11)
RDW: 17.4 % — ABNORMAL HIGH (ref 11.5–15.5)
RDW: 16.9 % — ABNORMAL HIGH (ref 11.5–15.5)
WBC: 6.4 10*3/uL (ref 4.0–10.5)
WBC: 8.7 10*3/uL (ref 4.0–10.5)
nRBC: 0.6 % — ABNORMAL HIGH (ref 0.0–0.2)
nRBC: 1 % — ABNORMAL HIGH (ref 0.0–0.2)

## 2024-01-20 LAB — PREPARE RBC (CROSSMATCH)

## 2024-01-20 LAB — BASIC METABOLIC PANEL
Anion gap: 9 (ref 5–15)
BUN: 68 mg/dL — ABNORMAL HIGH (ref 8–23)
CO2: 20 mmol/L — ABNORMAL LOW (ref 22–32)
Calcium: 8.7 mg/dL — ABNORMAL LOW (ref 8.9–10.3)
Chloride: 117 mmol/L — ABNORMAL HIGH (ref 98–111)
Creatinine, Ser: 1.24 mg/dL — ABNORMAL HIGH (ref 0.44–1.00)
GFR, Estimated: 41 mL/min — ABNORMAL LOW (ref >60)
Glucose, Bld: 123 mg/dL — ABNORMAL HIGH (ref 70–99)
Potassium: 3.4 mmol/L — ABNORMAL LOW (ref 3.5–5.1)
Sodium: 146 mmol/L — ABNORMAL HIGH (ref 135–145)

## 2024-01-20 SURGERY — COLONOSCOPY
Anesthesia: Monitor Anesthesia Care | Laterality: Left

## 2024-01-20 MED ORDER — POTASSIUM CHLORIDE 2 MEQ/ML IV SOLN: INTRAVENOUS | Status: DC

## 2024-01-20 MED ORDER — SODIUM CHLORIDE 0.9 % IV SOLN: INTRAVENOUS | Status: DC | PRN

## 2024-01-20 MED ORDER — PROPOFOL 1000 MG/100ML IV EMUL
INTRAVENOUS | Status: AC
  Filled 2024-01-20: qty 100

## 2024-01-20 MED ORDER — SODIUM CHLORIDE 0.9% IV SOLUTION: Freq: Once | INTRAVENOUS | Status: AC

## 2024-01-20 MED ORDER — POTASSIUM CHLORIDE 2 MEQ/ML IV SOLN
INTRAVENOUS | Status: DC
  Filled 2024-01-20: qty 1000

## 2024-01-20 MED ORDER — POTASSIUM CL IN DEXTROSE 5% 20 MEQ/L IV SOLN
20.0000 meq | INTRAVENOUS | Status: DC
  Filled 2024-01-20: qty 1000

## 2024-01-20 MED ORDER — PROPOFOL 500 MG/50ML IV EMUL
INTRAVENOUS | Status: DC | PRN
  Administered 2024-01-20: 40 ug/kg/min via INTRAVENOUS

## 2024-01-20 MED ORDER — PHENYLEPHRINE HCL (PRESSORS) 10 MG/ML IV SOLN
INTRAVENOUS | Status: DC | PRN
  Administered 2024-01-20: 160 ug via INTRAVENOUS

## 2024-01-20 MED ORDER — EPHEDRINE SULFATE (PRESSORS) 50 MG/ML IJ SOLN
INTRAMUSCULAR | Status: DC | PRN
  Administered 2024-01-20: 5 mg via INTRAVENOUS
  Administered 2024-01-20: 10 mg via INTRAVENOUS

## 2024-01-20 MED ORDER — POTASSIUM CHLORIDE 10 MEQ/100ML IV SOLN
10.0000 meq | INTRAVENOUS | Status: AC
  Filled 2024-01-20: qty 100

## 2024-01-20 MED ORDER — MAGNESIUM SULFATE 2 GM/50ML IV SOLN
2.0000 g | Freq: Once | INTRAVENOUS | Status: AC
  Administered 2024-01-20: 2 g via INTRAVENOUS
  Filled 2024-01-20: qty 50

## 2024-01-20 MED ORDER — HYDRALAZINE HCL 20 MG/ML IJ SOLN: 5.0000 mg | Freq: Four times a day (QID) | INTRAMUSCULAR | Status: DC | PRN

## 2024-01-20 MED ORDER — LIDOCAINE HCL (CARDIAC) PF 100 MG/5ML IV SOSY
PREFILLED_SYRINGE | INTRAVENOUS | Status: DC | PRN
  Administered 2024-01-20: 60 mg via INTRATRACHEAL

## 2024-01-20 MED ORDER — DEXTROSE 5 % IV SOLN: INTRAVENOUS | Status: AC

## 2024-01-20 MED ORDER — PROPOFOL 10 MG/ML IV BOLUS
INTRAVENOUS | Status: DC | PRN
  Administered 2024-01-20: 30 mg via INTRAVENOUS

## 2024-01-20 NOTE — Progress Notes (Signed)
PROGRESS NOTE    Breanna [name]  ZHY:[id number] DOB: [birth year] DOA: 01/17/2024 PCP: Tresa Garter, MD   Brief Narrative: 89 year old with past medical history significant for hypertension, diabetes type 2, CKD 3B, history of CVA, gout presented with loss of appetite, chills, lightheadedness and syncope.  Evaluation in the ED patient was found to have a hemoglobin of 7.3 troponin of 49 occult blood positive, melena noted on DRE   Assessment & Plan:   Principal Problem:   Syncope Active Problems:   DM2 (diabetes mellitus, type 2) (HCC)   Essential hypertension   History of cardioembolic cerebrovascular accident (CVA)   Diarrhea   Normocytic anemia   Elevated troponin   Stage 3b chronic kidney disease (CKD) (HCC)   Thrombocytopenia (HCC)   ABLA (acute blood loss anemia)   Heme positive stool   Symptomatic anemia   1-Syncope -Likely in setting of hypovolemia, low hb.  -Received Blood transfusion.  -ECHO: Moderate mitral valve regurgitation, no aortic stenosis. Diastolic Dysfunction grade 2, EF 65 %  Acute blood loss anemia, thrombocytopenia Melena -Presents with Hb 7---6.5 -She received 1 unit PRBC>  -GI have been consulted.  -PPI BID -Anemia panel not diagnostic.  -Endoscopy normal esophagus, multiple gastric polyps, normal mucosa was found in the entire stomach.  No areas of active bleeding. -Colonoscopy ; many diverticula were found in the entire colon, there was blood and clots throughout the colon without an obvious site of active bleeding. Hb down to 6. Will proceed with 2 units PRBC>  -If obvious bleeding, patient will need a CTA and if CTA positive then she will need IR consultation for consideration of embolization.   Hypertension -Continue with Norvasc.   Chronic diastolic heart failure -Will give lasix in between blood transfusion.  -Hold  spironolactone. She is not taking Cozaar/   CKD 3B -Cr baseline 1.0--1.3 -stable monitor.    Hypernatremia; will start D 5  Hypokalemia; replaced.  Diabetes type 2 -Hold Metformin.  -SSI   HO of CVA Hold aspirin in setting GI bleed.   Evaded troponins In setting of anemia ECHO normal.    Estimated body mass index is 19.15 kg/m as calculated from the following:   Height as of this encounter: 5\' 2"  (1.575 m).   Weight as of this encounter: 47.5 kg.   DVT prophylaxis: SCD Code Status: Full code Family Communication: Son who was at bedside Disposition Plan:  Status is: Observation The patient remains OBS appropriate and will d/c before 2 midnights.    Consultants:  GI  Procedures:  ECHO  Antimicrobials:    Subjective: Had multiples BM.  Denies pain  Objective: Vitals:   01/19/24 1939 01/20/24 0426 01/20/24 0500 01/20/24 0930  BP: 103/68 (!) 142/88  (!) 150/72  Pulse: 61 69  66  Resp: 18 18    Temp: 99.5 F (37.5 C) (!) 97.4 F (36.3 C)  98.2 F (36.8 C)  TempSrc: Oral   Oral  SpO2: 97% 99%  100%  Weight:   47.5 kg   Height:        Intake/Output Summary (Last 24 hours) at 01/20/2024 0933 Last data filed at 01/19/2024 1700 Gross per 24 hour  Intake 240 ml  Output 650 ml  Net -410 ml   Filed Weights   01/18/24 1334 01/19/24 0652 01/20/24 0500  Weight: 48.9 kg 49.8 kg 47.5 kg    Examination:  General exam: NAD Respiratory system: CTA Cardiovascular system: S 1, S 2 RRR Gastrointestinal system: BS present,  soft, nt Central nervous system: alert  Extremities: Symmetric 5 x 5 power.   Data Reviewed: I have personally reviewed following labs and imaging studies  CBC: Recent Labs  Lab 01/17/24 1730 01/18/24 0408 01/18/24 1420 01/18/24 2101 01/19/24 0544 01/19/24 1244 01/20/24 0511  WBC 8.4   < > 6.7 5.8 6.2 5.3 6.4  NEUTROABS 7.1  --   --   --   --   --   --   HGB 7.3*   < > 8.0* 7.4* 7.6* 9.0* 6.4*  HCT 23.8*   < > 25.0* 23.2* 23.8* 27.3* 20.4*  MCV 88.8   < > 88.7 88.5 88.8 84.8 90.3  PLT 105*   < > 92* 83* 87* 63* 97*    < > = values in this interval not displayed.   Basic Metabolic Panel: Recent Labs  Lab 01/17/24 1730 01/18/24 0408 01/18/24 1816 01/19/24 0544 01/20/24 0511  NA 141 142  --  143 146*  K 3.9 4.1 3.1* 4.1 3.4*  CL 109 112*  --  114* 117*  CO2 23 22  --  20* 20*  GLUCOSE 148* 128*  --  111* 123*  BUN 61* 64*  --  65* 68*  CREATININE 1.12* 0.94  --  0.95 1.24*  CALCIUM 9.0 8.6*  --  8.7* 8.7*  MG  --  2.1  --  1.9  --   PHOS  --   --   --  3.7  --    GFR: Estimated Creatinine Clearance: 22.6 mL/min (A) (by C-G formula based on SCr of 1.24 mg/dL (H)). Liver Function Tests: Recent Labs  Lab 01/17/24 1730  AST 16  ALT 8  ALKPHOS 57  BILITOT 0.4  PROT 6.4*  ALBUMIN 3.1*   Recent Labs  Lab 01/17/24 1730  LIPASE 30   No results for input(s): "AMMONIA" in the last 168 hours. Coagulation Profile: No results for input(s): "INR", "PROTIME" in the last 168 hours. Cardiac Enzymes: No results for input(s): "CKTOTAL", "CKMB", "CKMBINDEX", "TROPONINI" in the last 168 hours. BNP (last 3 results) No results for input(s): "PROBNP" in the last 8760 hours. HbA1C: No results for input(s): "HGBA1C" in the last 72 hours. CBG: Recent Labs  Lab 01/19/24 1609 01/19/24 2033 01/20/24 0009 01/20/24 0423 01/20/24 0746  GLUCAP 104* 117* 114* 121* 108*   Lipid Profile: No results for input(s): "CHOL", "HDL", "LDLCALC", "TRIG", "CHOLHDL", "LDLDIRECT" in the last 72 hours. Thyroid Function Tests: No results for input(s): "TSH", "T4TOTAL", "FREET4", "T3FREE", "THYROIDAB" in the last 72 hours. Anemia Panel: Recent Labs    01/18/24 0408  VITAMINB12 1,264*  FOLATE 18.8  FERRITIN 28  TIBC 283  IRON 44  RETICCTPCT 1.8   Sepsis Labs: No results for input(s): "PROCALCITON", "LATICACIDVEN" in the last 168 hours.  Recent Results (from the past 240 hours)  Resp panel by RT-PCR (RSV, Flu A&B, Covid) Anterior Nasal Swab     Status: None   Collection Time: 01/17/24  5:20 PM   Specimen:  Anterior Nasal Swab  Result Value Ref Range Status   SARS Coronavirus 2 by RT PCR NEGATIVE NEGATIVE Final    Comment: (NOTE) SARS-CoV-2 target nucleic acids are NOT DETECTED.  The SARS-CoV-2 RNA is generally detectable in upper respiratory specimens during the acute phase of infection. The lowest concentration of SARS-CoV-2 viral copies this assay can detect is 138 copies/mL. A negative result does not preclude SARS-Cov-2 infection and should not be used as the sole basis for treatment or other  patient management decisions. A negative result may occur with  improper specimen collection/handling, submission of specimen other than nasopharyngeal swab, presence of viral mutation(s) within the areas targeted by this assay, and inadequate number of viral copies(<138 copies/mL). A negative result must be combined with clinical observations, patient history, and epidemiological information. The expected result is Negative.  Fact Sheet for Patients:  BloggerCourse.com  Fact Sheet for Healthcare Providers:  SeriousBroker.it  This test is no t yet approved or cleared by the [place] FDA and  has been authorized for detection and/or diagnosis of SARS-CoV-2 by FDA under an Emergency Use Authorization (EUA). This EUA will remain  in effect (meaning this test can be used) for the duration of the COVID-19 declaration under Section 564(b)(1) of the Act, 21 U.S.C.section 360bbb-3(b)(1), unless the authorization is terminated  or revoked sooner.       Influenza A by PCR NEGATIVE NEGATIVE Final   Influenza B by PCR NEGATIVE NEGATIVE Final    Comment: (NOTE) The Xpert Xpress SARS-CoV-2/FLU/RSV plus assay is intended as an aid in the diagnosis of influenza from Nasopharyngeal swab specimens and should not be used as a sole basis for treatment. Nasal washings and aspirates are unacceptable for Xpert Xpress SARS-CoV-2/FLU/RSV testing.  Fact  Sheet for Patients: BloggerCourse.com  Fact Sheet for Healthcare Providers: SeriousBroker.it  This test is not yet approved or cleared by the [place] FDA and has been authorized for detection and/or diagnosis of SARS-CoV-2 by FDA under an Emergency Use Authorization (EUA). This EUA will remain in effect (meaning this test can be used) for the duration of the COVID-19 declaration under Section 564(b)(1) of the Act, 21 U.S.C. section 360bbb-3(b)(1), unless the authorization is terminated or revoked.     Resp Syncytial Virus by PCR NEGATIVE NEGATIVE Final    Comment: (NOTE) Fact Sheet for Patients: BloggerCourse.com  Fact Sheet for Healthcare Providers: SeriousBroker.it  This test is not yet approved or cleared by the [place] FDA and has been authorized for detection and/or diagnosis of SARS-CoV-2 by FDA under an Emergency Use Authorization (EUA). This EUA will remain in effect (meaning this test can be used) for the duration of the COVID-19 declaration under Section 564(b)(1) of the Act, 21 U.S.C. section 360bbb-3(b)(1), unless the authorization is terminated or revoked.  Performed at [facility], [address], [place], [place] [address]          Radiology Studies: ECHOCARDIOGRAM COMPLETE Result Date: 01/18/2024    ECHOCARDIOGRAM REPORT   Patient Name:   Breanna [name] Date of Exam: 01/18/2024 Medical Rec #:  [id number]       Height:       62.0 in Accession #:    [id number]      Weight:       113.0 lb Date of Birth:  10/23/1931        BSA:          1.500 m Patient Age:    89 years        BP:           173/89 mmHg Patient Gender: F               HR:           55 bpm. Exam Location:  Inpatient Procedure: 2D Echo, Cardiac Doppler and Color Doppler Indications:    Syncope  History:        Patient has prior history of Echocardiogram examinations, most  recent 03/01/2023. Arrythmias:Bradycardia; Risk                 Factors:Hypertension and Diabetes.  Sonographer:    Webb Laws Referring Phys: [id number] TIMOTHY S OPYD IMPRESSIONS  1. Left ventricular ejection fraction, by estimation, is 60 to 65%. The left ventricle has normal function. The left ventricle has no regional wall motion abnormalities. There is moderate left ventricular hypertrophy. Left ventricular diastolic parameters are consistent with Grade II diastolic dysfunction (pseudonormalization). Elevated left atrial pressure. The average left ventricular global longitudinal strain is -20.3 %.  2. Right ventricular systolic function is normal. The right ventricular size is normal. There is moderately elevated pulmonary artery systolic pressure. The estimated right ventricular systolic pressure is 47.4 mmHg.  3. Left atrial size was severely dilated.  4. The mitral valve is degenerative. Moderate mitral valve regurgitation.  5. Tricuspid valve regurgitation is mild to moderate.  6. The aortic valve is tricuspid. Aortic valve regurgitation is trivial. Aortic valve sclerosis is present, with no evidence of aortic valve stenosis.  7. The inferior vena cava is normal in size with greater than 50% respiratory variability, suggesting right atrial pressure of 3 mmHg. FINDINGS  Left Ventricle: Left ventricular ejection fraction, by estimation, is 60 to 65%. The left ventricle has normal function. The left ventricle has no regional wall motion abnormalities. The average left ventricular global longitudinal strain is -20.3 %. The left ventricular internal cavity size was normal in size. There is moderate left ventricular hypertrophy. Left ventricular diastolic parameters are consistent with Grade II diastolic dysfunction (pseudonormalization). Elevated left atrial pressure. Right Ventricle: The right ventricular size is normal. No increase in right ventricular wall thickness. Right ventricular systolic  function is normal. There is moderately elevated pulmonary artery systolic pressure. The tricuspid regurgitant velocity is 3.33 m/s, and with an assumed right atrial pressure of 3 mmHg, the estimated right ventricular systolic pressure is 47.4 mmHg. Left Atrium: Left atrial size was severely dilated. Right Atrium: Right atrial size was normal in size. Pericardium: There is no evidence of pericardial effusion. Mitral Valve: The mitral valve is degenerative in appearance. Moderate mitral valve regurgitation. Tricuspid Valve: The tricuspid valve is normal in structure. Tricuspid valve regurgitation is mild to moderate. Aortic Valve: The aortic valve is tricuspid. Aortic valve regurgitation is trivial. Aortic regurgitation PHT measures 654 msec. Aortic valve sclerosis is present, with no evidence of aortic valve stenosis. Pulmonic Valve: The pulmonic valve was not well visualized. Pulmonic valve regurgitation is not visualized. Aorta: The aortic root and ascending aorta are structurally normal, with no evidence of dilitation. Venous: The inferior vena cava is normal in size with greater than 50% respiratory variability, suggesting right atrial pressure of 3 mmHg. IAS/Shunts: The interatrial septum was not well visualized.  LEFT VENTRICLE PLAX 2D LVIDd:         3.80 cm     Diastology LVIDs:         2.40 cm     LV e' medial:    3.92 cm/s LV PW:         1.60 cm     LV E/e' medial:  18.6 LV IVS:        1.60 cm     LV e' lateral:   4.46 cm/s LVOT diam:     2.00 cm     LV E/e' lateral: 16.4 LV SV:         64 LV SV Index:   43  2D Longitudinal Strain LVOT Area:     3.14 cm    2D Strain GLS Avg:     -20.3 %  LV Volumes (MOD) LV vol d, MOD A2C: 54.4 ml LV vol d, MOD A4C: 68.2 ml LV vol s, MOD A2C: 21.8 ml LV vol s, MOD A4C: 23.4 ml LV SV MOD A2C:     32.6 ml LV SV MOD A4C:     68.2 ml LV SV MOD BP:      38.2 ml RIGHT VENTRICLE             IVC RV Basal diam:  3.00 cm     IVC diam: 1.50 cm RV S prime:     14.50 cm/s TAPSE  (M-mode): 2.9 cm LEFT ATRIUM              Index        RIGHT ATRIUM           Index LA diam:        4.70 cm  3.13 cm/m   RA Area:     10.60 cm LA Vol (A2C):   98.4 ml  65.61 ml/m  RA Volume:   21.30 ml  14.20 ml/m LA Vol (A4C):   120.0 ml 80.01 ml/m LA Biplane Vol: 116.0 ml 77.34 ml/m  AORTIC VALVE LVOT Vmax:   97.30 cm/s LVOT Vmean:  68.000 cm/s LVOT VTI:    0.203 m AI PHT:      654 msec  AORTA Ao Root diam: 3.10 cm Ao Asc diam:  3.20 cm MITRAL VALVE                  TRICUSPID VALVE MV Area (PHT): 3.65 cm       TR Peak grad:   44.4 mmHg MV Decel Time: 208 msec       TR Vmax:        333.00 cm/s MR Peak grad:    169.5 mmHg MR Mean grad:    108.0 mmHg   SHUNTS MR Vmax:         651.00 cm/s  Systemic VTI:  0.20 m MR Vmean:        495.0 cm/s   Systemic Diam: 2.00 cm MR PISA:         1.01 cm MR PISA Eff ROA: 6 mm MR PISA Radius:  0.40 cm MV E velocity: 73.10 cm/s MV A velocity: 120.00 cm/s MV E/A ratio:  0.61 Epifanio Lesches MD Electronically signed by Epifanio Lesches MD Signature Date/Time: 01/18/2024/1:10:41 PM    Final         Scheduled Meds:  allopurinol  50 mg Oral Daily   amLODipine  5 mg Oral Daily   ascorbic acid  500 mg Oral Daily   dorzolamide-timolol  1 drop Both Eyes BID   feeding supplement  1 Container Oral TID BM   gabapentin  100 mg Oral BID   insulin aspart  0-6 Units Subcutaneous Q4H   latanoprost  1 drop Both Eyes QHS   pantoprazole  40 mg Oral Daily   sertraline  100 mg Oral Daily   sodium chloride flush  3 mL Intravenous Q12H   sodium chloride flush  3-10 mL Intravenous Q12H   Continuous Infusions:  dextrose 5 % 1,000 mL with potassium chloride 20 mEq/L Pediatric IV infusion     dextrose 5 % with KCl 20 mEq / L     potassium chloride       LOS: 0  days    Time spent: 35 minutes    Alba Cory, MD [facility]   If 7PM-7AM, please contact night-coverage [email or web address]  01/20/2024, 9:33 AM

## 2024-01-20 NOTE — Op Note (Signed)
[facility] Patient Name: Breanna [name] Procedure Date: 01/20/2024 MRN: [id number] Attending MD: Wilhemina Bonito. Marina Goodell , MD, [id number] Date of Birth: 12/10/1931 CSN: [id number] Age: 89 Admit Type: Inpatient Procedure:                Colonoscopy Indications:              Hematochezia, Heme positive stool, Melena, Acute                            post hemorrhagic anemia Providers:                Wilhemina Bonito. Marina Goodell, MD, Doristine Mango, RN, Alan Ripper,                            Technician Referring MD:             [facility] Medicines:                Monitored Anesthesia Care Complications:            No immediate complications. Estimated blood loss:                            None. Estimated Blood Loss:     Estimated blood loss: none. Procedure:                Pre-Anesthesia Assessment:                           - Prior to the procedure, a History and Physical                            was performed, and patient medications and                            allergies were reviewed. The patient's tolerance of                            previous anesthesia was also reviewed. The risks                            and benefits of the procedure and the sedation                            options and risks were discussed with the patient.                            All questions were answered, and informed consent                            was obtained. Prior Anticoagulants: The patient has                            taken no anticoagulant or antiplatelet agents. ASA                            Grade Assessment: III -  A patient with severe                            systemic disease. After reviewing the risks and                            benefits, the patient was deemed in satisfactory                            condition to undergo the procedure.                           After obtaining informed consent, the colonoscope                            was passed under direct vision.  Throughout the                            procedure, the patient's blood pressure, pulse, and                            oxygen saturations were monitored continuously. The                            [id number]-HQ190L ([id number]) Olympus colonoscope was                            introduced through the anus and advanced to the the                            cecum, identified by appendiceal orifice and                            ileocecal valve. The ileocecal valve, appendiceal                            orifice, and rectum were photographed. The quality                            of the bowel preparation was good. The colonoscopy                            was performed without difficulty. The patient                            tolerated the procedure well. The bowel preparation                            used was SUPREP via split dose instruction. Scope In: 1:59:37 PM Scope Out: 2:21:05 PM Scope Withdrawal Time: 0 hours 15 minutes 55 seconds  Total Procedure Duration: 0 hours 21 minutes 28 seconds  Findings:      Many diverticula were found in the entire colon. There was blood and       clots throughout the colon without an  obvious site of active bleeding.       The ileum was intubated for short period with no blood emanating.      There was evidence of prior sigmoid resection. A 5 mm cecal polyp was       noted but not removed (due to issues with bleeding). The exam was       otherwise without abnormality on direct and retroflexion views. Impression:               - Diverticulosis in the entire examined colon with                            associated about the colon without an active site                            of bleeding identified. Most consistent with.                           - The examination was otherwise normal on direct                            and retroflexion views, save diminutive cecal polyp                            which was not removed and prior sigmoid resection                             resection.                           - No specimens collected. Moderate Sedation:      none Recommendation:           1. Continue to monitor stools and blood counts                           2. Transfuse to maintain hemoglobin level above 7.5                           3. If obvious ongoing bleeding, would recommend                            CTA. If CTA positive, then refer to IR for                            embolization therapy.                           4. Clear liquid diet for now.                           Discussed with the patient and her son. They were                            provided a copy of this report.  GI will follow. Procedure Code(s):        --- Professional ---                           [phone], Colonoscopy, flexible; diagnostic, including                            collection of specimen(s) by brushing or washing,                            when performed (separate procedure) Diagnosis Code(s):        --- Professional ---                           K92.1, Melena (includes Hematochezia)                           R19.5, Other fecal abnormalities                           D62, Acute posthemorrhagic anemia                           K57.30, Diverticulosis of large intestine without                            perforation or abscess without bleeding CPT copyright 2022 American [employer] Association. All rights reserved. The codes documented in this report are preliminary and upon coder review may  be revised to meet current compliance requirements. Wilhemina Bonito. Marina Goodell, MD 01/20/2024 2:33:11 PM This report has been signed electronically. Number of Addenda: 0

## 2024-01-20 NOTE — TOC CM/SW Note (Signed)
Transition of Care [facility]) - Inpatient Brief Assessment   Patient Details  Name: Breanna [name] MRN: [id number] Date of Birth: 1931-09-08  Transition of Care [facility]) CM/SW Contact:    Otelia Santee, LCSW Phone Number: 01/20/2024, 3:34 PM   Clinical Narrative:    Transition of Care Asessment: Insurance and Status: Insurance coverage has been reviewed Patient has primary care physician: Yes Home environment has been reviewed: Single family home Prior level of function:: Independent/modified independent Prior/Current Home Services: No current home services Social Drivers of Health Review: SDOH reviewed no interventions necessary Readmission risk has been reviewed: Yes Transition of care needs: transition of care needs identified, TOC will continue to follow

## 2024-01-20 NOTE — Anesthesia Preprocedure Evaluation (Signed)
Anesthesia Evaluation  Patient identified by MRN, date of birth, ID band Patient awake    Reviewed: Allergy & Precautions, H&P , NPO status , Patient's Chart, lab work & pertinent test results  Airway Mallampati: II  TM Distance: >3 FB Neck ROM: Full    Dental no notable dental hx. (+) Teeth Intact, Dental Advisory Given   Pulmonary shortness of breath   Pulmonary exam normal breath sounds clear to auscultation       Cardiovascular hypertension, Pt. on medications  Rhythm:Regular Rate:Normal  Nl EF. Mild/mod TR, Mod MR   Neuro/Psych  Headaches   Depression       GI/Hepatic Neg liver ROS,GERD  Medicated,,  Endo/Other  diabetes, Type 2, Oral Hypoglycemic Agents    Renal/GU negative Renal ROS  negative genitourinary   Musculoskeletal  (+) Arthritis , Osteoarthritis,    Abdominal   Peds  Hematology  (+) Blood dyscrasia, anemia   Anesthesia Other Findings   Reproductive/Obstetrics negative OB ROS                             Anesthesia Physical Anesthesia Plan  ASA: 3  Anesthesia Plan: MAC   Post-op Pain Management: Minimal or no pain anticipated   Induction: Intravenous  PONV Risk Score and Plan: 2 and Propofol infusion and Treatment may vary due to age or medical condition  Airway Management Planned: Natural Airway and Simple Face Mask  Additional Equipment:   Intra-op Plan:   Post-operative Plan:   Informed Consent: I have reviewed the patients History and Physical, chart, labs and discussed the procedure including the risks, benefits and alternatives for the proposed anesthesia with the patient or authorized representative who has indicated his/her understanding and acceptance.     Dental advisory given  Plan Discussed with: CRNA  Anesthesia Plan Comments:        Anesthesia Quick Evaluation

## 2024-01-20 NOTE — Plan of Care (Signed)
Problem: Activity: Goal: Risk for activity intolerance will decrease Outcome: Progressing   Problem: Safety: Goal: Ability to remain free from injury will improve Outcome: Progressing   Problem: Skin Integrity: Goal: Risk for impaired skin integrity will decrease Outcome: Progressing

## 2024-01-20 NOTE — Plan of Care (Signed)
  Problem: Education: Goal: Ability to describe self-care measures that may prevent or decrease complications (Diabetes Survival Skills Education) will improve Outcome: Progressing Goal: Individualized Educational Video(s) Outcome: Progressing   Problem: Coping: Goal: Ability to adjust to condition or change in health will improve Outcome: Progressing   Problem: Fluid Volume: Goal: Ability to maintain a balanced intake and output will improve Outcome: Progressing   Problem: Health Behavior/Discharge Planning: Goal: Ability to identify and utilize available resources and services will improve Outcome: Progressing Goal: Ability to manage health-related needs will improve Outcome: Progressing   Problem: Metabolic: Goal: Ability to maintain appropriate glucose levels will improve Outcome: Progressing   Problem: Nutritional: Goal: Maintenance of adequate nutrition will improve Outcome: Progressing Goal: Progress toward achieving an optimal weight will improve Outcome: Progressing   Problem: Skin Integrity: Goal: Risk for impaired skin integrity will decrease Outcome: Progressing   Problem: Tissue Perfusion: Goal: Adequacy of tissue perfusion will improve Outcome: Progressing   Problem: Education: Goal: Knowledge of condition and prescribed therapy will improve Outcome: Progressing   Problem: Cardiac: Goal: Will achieve and/or maintain adequate cardiac output Outcome: Progressing   Problem: Physical Regulation: Goal: Complications related to the disease process, condition or treatment will be avoided or minimized Outcome: Progressing   Problem: Education: Goal: Knowledge of General Education information will improve Description: Including pain rating scale, medication(s)/side effects and non-pharmacologic comfort measures Outcome: Progressing   Problem: Health Behavior/Discharge Planning: Goal: Ability to manage health-related needs will improve Outcome:  Progressing   Problem: Clinical Measurements: Goal: Ability to maintain clinical measurements within normal limits will improve Outcome: Progressing Goal: Will remain free from infection Outcome: Progressing Goal: Diagnostic test results will improve Outcome: Progressing Goal: Respiratory complications will improve Outcome: Progressing Goal: Cardiovascular complication will be avoided Outcome: Progressing   Problem: Activity: Goal: Risk for activity intolerance will decrease Outcome: Progressing   Problem: Nutrition: Goal: Adequate nutrition will be maintained Outcome: Progressing   Problem: Coping: Goal: Level of anxiety will decrease Outcome: Progressing   Problem: Elimination: Goal: Will not experience complications related to bowel motility Outcome: Progressing Goal: Will not experience complications related to urinary retention Outcome: Progressing   Problem: Pain Managment: Goal: General experience of comfort will improve and/or be controlled Outcome: Progressing   Problem: Safety: Goal: Ability to remain free from injury will improve Outcome: Progressing   Problem: Skin Integrity: Goal: Risk for impaired skin integrity will decrease Outcome: Progressing

## 2024-01-20 NOTE — Progress Notes (Signed)
Patient finished all her bowel prep for her procedure this AM.

## 2024-01-20 NOTE — Interval H&P Note (Signed)
History and Physical Interval Note:  01/20/2024 1:39 PM  [facility]  has presented today for surgery, with the diagnosis of Acute blood loss anemia, symptomatic anemia, heme positive stool.  The various methods of treatment have been discussed with the patient and family. After consideration of risks, benefits and other options for treatment, the patient has consented to  Procedure(s): COLONOSCOPY (Left) as a surgical intervention.  The patient's history has been reviewed, patient examined, no change in status, stable for surgery.  I have reviewed the patient's chart and labs.  Questions were answered to the patient's satisfaction.    Case reviewed with Dr. Barron Alvine.  Patient assessed at bedside in the endoscopy suite preprocedure.  Son at bedside.   Yancey Flemings

## 2024-01-20 NOTE — Transfer of Care (Signed)
Immediate Anesthesia Transfer of Care Note  Patient: Breanna [name]  Procedure(s) Performed: COLONOSCOPY (Left)  Patient Location: PACU  Anesthesia Type:MAC  Level of Consciousness: awake and alert   Airway & Oxygen Therapy: Patient Spontanous Breathing and Patient connected to face mask oxygen  Post-op Assessment: Report given to RN and Post -op Vital signs reviewed and stable  Post vital signs: Reviewed and stable  Last Vitals:  Vitals Value Taken Time  BP 128/62 01/20/24 1426  Temp    Pulse 61 01/20/24 1428  Resp 27 01/20/24 1428  SpO2 100 % 01/20/24 1428  Vitals shown include unfiled device data.  Last Pain:  Vitals:   01/20/24 1321  TempSrc: Temporal  PainSc: 0-No pain         Complications: No notable events documented.

## 2024-01-21 ENCOUNTER — Encounter (HOSPITAL_COMMUNITY): Payer: Self-pay | Admitting: Gastroenterology

## 2024-01-21 DIAGNOSIS — K5731 Diverticulosis of large intestine without perforation or abscess with bleeding: Secondary | ICD-10-CM | POA: Diagnosis not present

## 2024-01-21 DIAGNOSIS — R195 Other fecal abnormalities: Secondary | ICD-10-CM | POA: Diagnosis not present

## 2024-01-21 DIAGNOSIS — D696 Thrombocytopenia, unspecified: Secondary | ICD-10-CM | POA: Diagnosis not present

## 2024-01-21 DIAGNOSIS — D509 Iron deficiency anemia, unspecified: Secondary | ICD-10-CM | POA: Diagnosis not present

## 2024-01-21 DIAGNOSIS — D62 Acute posthemorrhagic anemia: Secondary | ICD-10-CM | POA: Diagnosis not present

## 2024-01-21 LAB — TYPE AND SCREEN
ABO/RH(D): AB POS
Antibody Screen: POSITIVE
DAT, IgG: NEGATIVE
Donor AG Type: NEGATIVE
Donor AG Type: NEGATIVE
Donor AG Type: NEGATIVE
Unit division: 0
Unit division: 0
Unit division: 0

## 2024-01-21 LAB — CBC
HCT: 23.8 % — ABNORMAL LOW (ref 36.0–46.0)
Hemoglobin: 7.9 g/dL — ABNORMAL LOW (ref 12.0–15.0)
MCH: 28.5 pg (ref 26.0–34.0)
MCHC: 33.2 g/dL (ref 30.0–36.0)
MCV: 85.9 fL (ref 80.0–100.0)
Platelets: 80 10*3/uL — ABNORMAL LOW (ref 150–400)
RBC: 2.77 MIL/uL — ABNORMAL LOW (ref 3.87–5.11)
RDW: 16.3 % — ABNORMAL HIGH (ref 11.5–15.5)
WBC: 9.4 10*3/uL (ref 4.0–10.5)
nRBC: 1.8 % — ABNORMAL HIGH (ref 0.0–0.2)

## 2024-01-21 LAB — BPAM RBC
Blood Product Expiration Date: 202502082359
Blood Product Expiration Date: 202502102359
Blood Product Expiration Date: 202502232359
ISSUE DATE / TIME: 202501250701
ISSUE DATE / TIME: 202501270928
ISSUE DATE / TIME: 202501272256
Unit Type and Rh: 202502082359
Unit Type and Rh: 6200
Unit Type and Rh: 6200
Unit Type and Rh: 6200

## 2024-01-21 LAB — GLUCOSE, CAPILLARY
Glucose-Capillary: 105 mg/dL — ABNORMAL HIGH (ref 70–99)
Glucose-Capillary: 125 mg/dL — ABNORMAL HIGH (ref 70–99)
Glucose-Capillary: 134 mg/dL — ABNORMAL HIGH (ref 70–99)
Glucose-Capillary: 147 mg/dL — ABNORMAL HIGH (ref 70–99)
Glucose-Capillary: 166 mg/dL — ABNORMAL HIGH (ref 70–99)
Glucose-Capillary: 185 mg/dL — ABNORMAL HIGH (ref 70–99)

## 2024-01-21 LAB — BASIC METABOLIC PANEL
Anion gap: 6 (ref 5–15)
BUN: 63 mg/dL — ABNORMAL HIGH (ref 8–23)
CO2: 22 mmol/L (ref 22–32)
Calcium: 8.1 mg/dL — ABNORMAL LOW (ref 8.9–10.3)
Chloride: 108 mmol/L (ref 98–111)
Creatinine, Ser: 1.19 mg/dL — ABNORMAL HIGH (ref 0.44–1.00)
GFR, Estimated: 43 mL/min — ABNORMAL LOW (ref >60)
Glucose, Bld: 130 mg/dL — ABNORMAL HIGH (ref 70–99)
Potassium: 3.6 mmol/L (ref 3.5–5.1)
Sodium: 136 mmol/L (ref 135–145)

## 2024-01-21 MED FILL — Nutritional Supplement Liquid: ORAL | Qty: 237 | Status: AC

## 2024-01-21 NOTE — Plan of Care (Signed)
  Problem: Education: Goal: Ability to describe self-care measures that may prevent or decrease complications (Diabetes Survival Skills Education) will improve Outcome: Progressing Goal: Individualized Educational Video(s) Outcome: Progressing   Problem: Coping: Goal: Ability to adjust to condition or change in health will improve Outcome: Progressing   Problem: Fluid Volume: Goal: Ability to maintain a balanced intake and output will improve Outcome: Progressing   Problem: Health Behavior/Discharge Planning: Goal: Ability to identify and utilize available resources and services will improve Outcome: Progressing Goal: Ability to manage health-related needs will improve Outcome: Progressing   Problem: Metabolic: Goal: Ability to maintain appropriate glucose levels will improve Outcome: Progressing   Problem: Nutritional: Goal: Maintenance of adequate nutrition will improve Outcome: Progressing Goal: Progress toward achieving an optimal weight will improve Outcome: Progressing   Problem: Skin Integrity: Goal: Risk for impaired skin integrity will decrease Outcome: Progressing   Problem: Tissue Perfusion: Goal: Adequacy of tissue perfusion will improve Outcome: Progressing   Problem: Education: Goal: Knowledge of condition and prescribed therapy will improve Outcome: Progressing   Problem: Cardiac: Goal: Will achieve and/or maintain adequate cardiac output Outcome: Progressing   Problem: Physical Regulation: Goal: Complications related to the disease process, condition or treatment will be avoided or minimized Outcome: Progressing   Problem: Education: Goal: Knowledge of General Education information will improve Description: Including pain rating scale, medication(s)/side effects and non-pharmacologic comfort measures Outcome: Progressing   Problem: Health Behavior/Discharge Planning: Goal: Ability to manage health-related needs will improve Outcome:  Progressing   Problem: Clinical Measurements: Goal: Ability to maintain clinical measurements within normal limits will improve Outcome: Progressing Goal: Will remain free from infection Outcome: Progressing Goal: Diagnostic test results will improve Outcome: Progressing Goal: Respiratory complications will improve Outcome: Progressing Goal: Cardiovascular complication will be avoided Outcome: Progressing   Problem: Activity: Goal: Risk for activity intolerance will decrease Outcome: Progressing   Problem: Nutrition: Goal: Adequate nutrition will be maintained Outcome: Progressing   Problem: Coping: Goal: Level of anxiety will decrease Outcome: Progressing   Problem: Elimination: Goal: Will not experience complications related to bowel motility Outcome: Progressing Goal: Will not experience complications related to urinary retention Outcome: Progressing   Problem: Pain Managment: Goal: General experience of comfort will improve and/or be controlled Outcome: Progressing   Problem: Safety: Goal: Ability to remain free from injury will improve Outcome: Progressing   Problem: Skin Integrity: Goal: Risk for impaired skin integrity will decrease Outcome: Progressing

## 2024-01-21 NOTE — Progress Notes (Signed)
PROGRESS NOTE    Breanna [name]  ZOX:[id number] DOB: 1931/09/27 DOA: 01/17/2024 PCP: Tresa Garter, MD   Brief Narrative: 89 year old with past medical history significant for hypertension, diabetes type 2, CKD 3B, history of CVA, gout presented with loss of appetite, chills, lightheadedness and syncope.  Evaluation in the ED patient was found to have a hemoglobin of 7.3 troponin of 49 occult blood positive, melena noted in the ED>   GI was consulted underwent endoscopy which was unrevealing.  Subsequently had colonoscopy on 1/27 that showed blood  but not the source. Plan to monitor hemoglobin and to monitor for hematochezia.  If she develops significant hematochezia plan is to get CTA abdomen and pelvis.  Assessment & Plan:   Principal Problem:   Syncope Active Problems:   DM2 (diabetes mellitus, type 2) (HCC)   Essential hypertension   History of cardioembolic cerebrovascular accident (CVA)   Diarrhea   Normocytic anemia   Elevated troponin   Stage 3b chronic kidney disease (CKD) (HCC)   Thrombocytopenia (HCC)   ABLA (acute blood loss anemia)   Heme positive stool   Symptomatic anemia   Diverticulosis of colon with hemorrhage   1-Syncope -Likely in setting of hypovolemia, low hb.  -Received Blood transfusion.  -ECHO: Moderate mitral valve regurgitation, no aortic stenosis. Diastolic Dysfunction grade 2, EF 65 %  Acute blood loss anemia, thrombocytopenia Melena -Presents with Hb 7---6.5 -She received total of 3 units unit PRBC> during  hospitalization. Last 2 units transfuse on 1/27 -GI have been consulted.  -PPI BID -Anemia panel not diagnostic.  -Endoscopy normal esophagus, multiple gastric polyps, normal mucosa was found in the entire stomach.  No areas of active bleeding. -Colonoscopy ; many diverticula were found in the entire colon, there was blood and clots throughout the colon without an obvious site of active bleeding. -If obvious bleeding, patient  will need a CTA and if CTA positive then she will need IR consultation for consideration of embolization. Monitor hemoglobin, she will be started on full liquid diet.  Hypertension -Continue with Norvasc.   Chronic diastolic heart failure -Will give lasix in between blood transfusion.  -Hold  spironolactone. She is not taking Cozaar/   CKD 3B -Cr baseline 1.0--1.3 -stable monitor.   Hypernatremia;  start D 5 , imoproved.   Hypokalemia; replaced.   Diabetes type 2 -Hold Metformin.  -SSI   HO of CVA Hold aspirin in setting GI bleed.   Evaded troponins In setting of anemia ECHO normal.    Estimated body mass index is 20 kg/m as calculated from the following:   Height as of this encounter: 5\' 2"  (1.575 m).   Weight as of this encounter: 49.6 kg.   DVT prophylaxis: SCD Code Status: Full code Family Communication: Daughter  who was at bedside Disposition Plan:  Status is: Observation The patient remains OBS appropriate and will d/c before 2 midnights.    Consultants:  GI  Procedures:  ECHO  Antimicrobials:    Subjective: Patient is alert, no new complaints other than mild headache.  He was able to eat some tomato of soup.  No bowel movement since yesterday.  Objective: Vitals:   01/20/24 2315 01/21/24 0130 01/21/24 0359 01/21/24 0659  BP: (!) 136/59 129/65  (!) 147/70  Pulse: 60 (!) 59  63  Resp:  18  18  Temp: 98.4 F (36.9 C) 98.4 F (36.9 C)  (!) 97.5 F (36.4 C)  TempSrc: Oral Oral    SpO2: 100%  100%  Weight:   49.6 kg   Height:        Intake/Output Summary (Last 24 hours) at 01/21/2024 0752 Last data filed at 01/21/2024 0500 Gross per 24 hour  Intake 1470.31 ml  Output --  Net 1470.31 ml   Filed Weights   01/19/24 0652 01/20/24 0500 01/21/24 0359  Weight: 49.8 kg 47.5 kg 49.6 kg    Examination:  General exam:NAD Respiratory system: CTA Cardiovascular system: S 1, S 2 RRR Gastrointestinal system: BS present, soft, nt Central  nervous system: Alert  Extremities: Symmetric 5 x 5 power.   Data Reviewed: I have personally reviewed following labs and imaging studies  CBC: Recent Labs  Lab 01/17/24 1730 01/18/24 0408 01/19/24 0544 01/19/24 1244 01/20/24 0511 01/20/24 1339 01/20/24 1522 01/20/24 1701 01/21/24 0605  WBC 8.4   < > 6.2 5.3 6.4  --   --  8.7 9.4  NEUTROABS 7.1  --   --   --   --   --   --   --   --   HGB 7.3*   < > 7.6* 9.0* 6.4* 8.2* 8.3* 7.5* 7.9*  HCT 23.8*   < > 23.8* 27.3* 20.4* 24.0* 26.2* 23.5* 23.8*  MCV 88.8   < > 88.8 84.8 90.3  --   --  86.4 85.9  PLT 105*   < > 87* 63* 97*  --   --  86* 80*   < > = values in this interval not displayed.   Basic Metabolic Panel: Recent Labs  Lab 01/17/24 1730 01/18/24 0408 01/18/24 1816 01/19/24 0544 01/20/24 0511 01/20/24 1339 01/21/24 0605  NA 141 142  --  143 146* 151* 136  K 3.9 4.1 3.1* 4.1 3.4* 3.6 3.6  CL 109 112*  --  114* 117* 116* 108  CO2 23 22  --  20* 20*  --  22  GLUCOSE 148* 128*  --  111* 123* 116* 130*  BUN 61* 64*  --  65* 68* 57* 63*  CREATININE 1.12* 0.94  --  0.95 1.24* 1.40* 1.19*  CALCIUM 9.0 8.6*  --  8.7* 8.7*  --  8.1*  MG  --  2.1  --  1.9  --   --   --   PHOS  --   --   --  3.7  --   --   --    GFR: Estimated Creatinine Clearance: 24.6 mL/min (A) (by C-G formula based on SCr of 1.19 mg/dL (H)). Liver Function Tests: Recent Labs  Lab 01/17/24 1730  AST 16  ALT 8  ALKPHOS 57  BILITOT 0.4  PROT 6.4*  ALBUMIN 3.1*   Recent Labs  Lab 01/17/24 1730  LIPASE 30   No results for input(s): "AMMONIA" in the last 168 hours. Coagulation Profile: No results for input(s): "INR", "PROTIME" in the last 168 hours. Cardiac Enzymes: No results for input(s): "CKTOTAL", "CKMB", "CKMBINDEX", "TROPONINI" in the last 168 hours. BNP (last 3 results) No results for input(s): "PROBNP" in the last 8760 hours. HbA1C: No results for input(s): "HGBA1C" in the last 72 hours. CBG: Recent Labs  Lab 01/20/24 1644  01/20/24 2026 01/20/24 2337 01/21/24 0402 01/21/24 0719  GLUCAP 128* 185* 167* 105* 125*   Lipid Profile: No results for input(s): "CHOL", "HDL", "LDLCALC", "TRIG", "CHOLHDL", "LDLDIRECT" in the last 72 hours. Thyroid Function Tests: No results for input(s): "TSH", "T4TOTAL", "FREET4", "T3FREE", "THYROIDAB" in the last 72 hours. Anemia Panel: No results for input(s): "VITAMINB12", "FOLATE", "  FERRITIN", "TIBC", "IRON", "RETICCTPCT" in the last 72 hours.  Sepsis Labs: No results for input(s): "PROCALCITON", "LATICACIDVEN" in the last 168 hours.  Recent Results (from the past 240 hours)  Resp panel by RT-PCR (RSV, Flu A&B, Covid) Anterior Nasal Swab     Status: None   Collection Time: 01/17/24  5:20 PM   Specimen: Anterior Nasal Swab  Result Value Ref Range Status   SARS Coronavirus 2 by RT PCR NEGATIVE NEGATIVE Final    Comment: (NOTE) SARS-CoV-2 target nucleic acids are NOT DETECTED.  The SARS-CoV-2 RNA is generally detectable in upper respiratory specimens during the acute phase of infection. The lowest concentration of SARS-CoV-2 viral copies this assay can detect is 138 copies/mL. A negative result does not preclude SARS-Cov-2 infection and should not be used as the sole basis for treatment or other patient management decisions. A negative result may occur with  improper specimen collection/handling, submission of specimen other than nasopharyngeal swab, presence of viral mutation(s) within the areas targeted by this assay, and inadequate number of viral copies(<138 copies/mL). A negative result must be combined with clinical observations, patient history, and epidemiological information. The expected result is Negative.  Fact Sheet for Patients:  BloggerCourse.com  Fact Sheet for Healthcare Providers:  SeriousBroker.it  This test is no t yet approved or cleared by the [place] FDA and  has been authorized for  detection and/or diagnosis of SARS-CoV-2 by FDA under an Emergency Use Authorization (EUA). This EUA will remain  in effect (meaning this test can be used) for the duration of the COVID-19 declaration under Section 564(b)(1) of the Act, 21 U.S.C.section 360bbb-3(b)(1), unless the authorization is terminated  or revoked sooner.       Influenza A by PCR NEGATIVE NEGATIVE Final   Influenza B by PCR NEGATIVE NEGATIVE Final    Comment: (NOTE) The Xpert Xpress SARS-CoV-2/FLU/RSV plus assay is intended as an aid in the diagnosis of influenza from Nasopharyngeal swab specimens and should not be used as a sole basis for treatment. Nasal washings and aspirates are unacceptable for Xpert Xpress SARS-CoV-2/FLU/RSV testing.  Fact Sheet for Patients: BloggerCourse.com  Fact Sheet for Healthcare Providers: SeriousBroker.it  This test is not yet approved or cleared by the [place] FDA and has been authorized for detection and/or diagnosis of SARS-CoV-2 by FDA under an Emergency Use Authorization (EUA). This EUA will remain in effect (meaning this test can be used) for the duration of the COVID-19 declaration under Section 564(b)(1) of the Act, 21 U.S.C. section 360bbb-3(b)(1), unless the authorization is terminated or revoked.     Resp Syncytial Virus by PCR NEGATIVE NEGATIVE Final    Comment: (NOTE) Fact Sheet for Patients: BloggerCourse.com  Fact Sheet for Healthcare Providers: SeriousBroker.it  This test is not yet approved or cleared by the [place] FDA and has been authorized for detection and/or diagnosis of SARS-CoV-2 by FDA under an Emergency Use Authorization (EUA). This EUA will remain in effect (meaning this test can be used) for the duration of the COVID-19 declaration under Section 564(b)(1) of the Act, 21 U.S.C. section 360bbb-3(b)(1), unless the authorization is  terminated or revoked.  Performed at [facility], [address] [place], [place] [address]          Radiology Studies: No results found.       Scheduled Meds:  sodium chloride   Intravenous Once   allopurinol  50 mg Oral Daily   amLODipine  5 mg Oral Daily   ascorbic acid  500 mg Oral Daily   dorzolamide-timolol  1 drop Both Eyes BID   feeding supplement  1 Container Oral TID BM   gabapentin  100 mg Oral BID   insulin aspart  0-6 Units Subcutaneous Q4H   latanoprost  1 drop Both Eyes QHS   sertraline  100 mg Oral Daily   sodium chloride flush  3 mL Intravenous Q12H   Continuous Infusions:  dextrose 100 mL/hr at 01/20/24 1845     LOS: 1 day    Time spent: 35 minutes    Josearmando Kuhnert A Alper Guilmette, MD [facility]   If 7PM-7AM, please contact night-coverage [email or web address]  01/21/2024, 7:52 AM

## 2024-01-21 NOTE — Progress Notes (Addendum)
Daily Progress Note  DOA: 01/17/2024 Hospital Day: 5   Chief Complaint:  anemia, FOBT+  ASSESSMENT    Brief Narrative:  Breanna [name] is a 89 y.o. year old female with a history of HTN, diabetes, CKD 3, CVA, gout, CHF, blindness, sigmoid colectomy 2006.  Admitted 1/24 with symptomatic anemia, FOBT+. GI consulted 1/25.     Symptomatic anemia, FOBT+.  Source of occult GI bleed not determined. EGD unrevealing Today  Hemodynamically stable. Hgb 6.4 yesterday, up to 7.9 this am after 2 additional units of blood ( total of 3 this admission).    Diminutive cecal polyp Not removed  Chronic thrombocytopenia  Platelet count stable in 80s  Blindness   Principal Problem:   Syncope Active Problems:   DM2 (diabetes mellitus, type 2) (HCC)   Essential hypertension   History of cardioembolic cerebrovascular accident (CVA)   Diarrhea   Normocytic anemia   Elevated troponin   Stage 3b chronic kidney disease (CKD) (HCC)   Thrombocytopenia (HCC)   ABLA (acute blood loss anemia)   Heme positive stool   Symptomatic anemia   Diverticulosis of colon with hemorrhage   PLAN   --Unclear if she had overt bleeding at home as she is blind. Lives with Son who will now be monitoring for dark stools / blood at home. Other than that will need monitoring of CBC as outpatient --Son asked about the CT scan. Explained that the CTA would be done only if overtly bleeding ( at rapid enough pace).  --Will put on full liquids for lunch and if no bleeding then can advance from there.    Subjective   No BMs today. Feels fine. Son visiting   Objective   Colonoscopy 1/27 - Diverticulosis in the entire examined colon with associated about the colon without an active site of bleeding identified. Most consistent with. - The examination was otherwise normal on direct and retroflexion views, save diminutive cecal polyp which was not removed and prior sigmoid resection resection. - No specimens  collected.  EGD 1/26 - Normal esophagus. - Multiple gastric polyps. - Normal mucosa was found in the entire stomach. - Normal examined duodenum. - No areas of active bleeding, stigmata of recent bleeding, or heme noted on this study. - No specimens collected  Recent Labs    01/20/24 0511 01/20/24 1339 01/20/24 1522 01/20/24 1701 01/21/24 0605  WBC 6.4  --   --  8.7 9.4  HGB 6.4*   < > 8.3* 7.5* 7.9*  HCT 20.4*   < > 26.2* 23.5* 23.8*  PLT 97*  --   --  86* 80*   < > = values in this interval not displayed.   BMET Recent Labs    01/19/24 0544 01/20/24 0511 01/20/24 1339 01/21/24 0605  NA 143 146* 151* 136  K 4.1 3.4* 3.6 3.6  CL 114* 117* 116* 108  CO2 20* 20*  --  22  GLUCOSE 111* 123* 116* 130*  BUN 65* 68* 57* 63*  CREATININE 0.95 1.24* 1.40* 1.19*  CALCIUM 8.7* 8.7*  --  8.1*     Scheduled inpatient medications:   sodium chloride   Intravenous Once   allopurinol  50 mg Oral Daily   amLODipine  5 mg Oral Daily   ascorbic acid  500 mg Oral Daily   dorzolamide-timolol  1 drop Both Eyes BID   feeding supplement  1 Container Oral TID BM   gabapentin  100 mg Oral BID   insulin  aspart  0-6 Units Subcutaneous Q4H   latanoprost  1 drop Both Eyes QHS   sertraline  100 mg Oral Daily   sodium chloride flush  3 mL Intravenous Q12H   Continuous inpatient infusions:   dextrose 100 mL/hr at 01/20/24 1845   PRN inpatient medications: acetaminophen **OR** acetaminophen, hydrALAZINE, prochlorperazine, senna-docusate  Vital signs in last 24 hours: Temp:  [97.3 F (36.3 C)-98.6 F (37 C)] 97.5 F (36.4 C) (01/28 0659) Pulse Rate:  [41-127] 63 (01/28 0659) Resp:  [14-27] 18 (01/28 0659) BP: (127-203)/(59-94) 147/70 (01/28 0659) SpO2:  [70 %-100 %] 100 % (01/28 0659) Weight:  [49.6 kg] 49.6 kg (01/28 0359) Last BM Date : 01/19/24  Intake/Output Summary (Last 24 hours) at 01/21/2024 0855 Last data filed at 01/21/2024 0500 Gross per 24 hour  Intake 1470.31 ml  Output --   Net 1470.31 ml    Intake/Output from previous day: 01/27 0701 - 01/28 0700 In: 1470.3 [I.V.:1120.3; Blood:300; IV Piggyback:50] Out: -  Intake/Output this shift: No intake/output data recorded.   Physical Exam:  General: Alert female in NAD Heart:  Regular rate and rhythm.  Pulmonary: Normal respiratory effort Abdomen: Soft, nondistended, nontender. Normal bowel sounds. Extremities: No lower extremity edema  Neurologic: Alert and oriented Psych: Pleasant. Cooperative. Insight appears normal.      LOS: 1 day   Willette Cluster ,NP 01/21/2024, 8:55 AM  GI ATTENDING  Interval history and data reviewed.  Agree with interval progress note as outlined above.  Hemoglobin stable posttransfusion.  Apparently has not had bowel movements since yesterday's procedure.  Hopefully her diverticular bleeding has stopped.  Continue to monitor closely (stools and hemoglobin).  Again, would transfuse for hemoglobin less than 7.5.  Advancing diet a bit.  Will follow.  Wilhemina Bonito. Eda Keys., M.D. [facility] Division of Gastroenterology

## 2024-01-21 NOTE — Anesthesia Postprocedure Evaluation (Signed)
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  Last Vitals:  Vitals:   01/21/24 0130 01/21/24 0659  BP: 129/65 (!) 147/70  Pulse: (!) 59 63  Resp: 18 18  Temp: 36.9 C (!) 36.4 C  SpO2:  100%    Last Pain:  Vitals:   01/21/24 0130  TempSrc: Oral  PainSc:    Pain Goal:                   Kennieth Rad

## 2024-01-22 ENCOUNTER — Telehealth: Payer: Self-pay | Admitting: Internal Medicine

## 2024-01-22 ENCOUNTER — Encounter (HOSPITAL_COMMUNITY): Payer: Self-pay | Admitting: Internal Medicine

## 2024-01-22 DIAGNOSIS — R195 Other fecal abnormalities: Secondary | ICD-10-CM | POA: Diagnosis not present

## 2024-01-22 DIAGNOSIS — D509 Iron deficiency anemia, unspecified: Secondary | ICD-10-CM | POA: Diagnosis not present

## 2024-01-22 DIAGNOSIS — R55 Syncope and collapse: Secondary | ICD-10-CM | POA: Diagnosis not present

## 2024-01-22 DIAGNOSIS — D62 Acute posthemorrhagic anemia: Secondary | ICD-10-CM

## 2024-01-22 DIAGNOSIS — D696 Thrombocytopenia, unspecified: Secondary | ICD-10-CM | POA: Diagnosis not present

## 2024-01-22 LAB — CBC
HCT: 20.7 % — ABNORMAL LOW (ref 36.0–46.0)
Hemoglobin: 6.7 g/dL — CL (ref 12.0–15.0)
MCH: 28.2 pg (ref 26.0–34.0)
MCHC: 32.4 g/dL (ref 30.0–36.0)
MCV: 87 fL (ref 80.0–100.0)
Platelets: 83 10*3/uL — ABNORMAL LOW (ref 150–400)
RBC: 2.38 MIL/uL — ABNORMAL LOW (ref 3.87–5.11)
RDW: 16.6 % — ABNORMAL HIGH (ref 11.5–15.5)
WBC: 8 10*3/uL (ref 4.0–10.5)
nRBC: 3.4 % — ABNORMAL HIGH (ref 0.0–0.2)

## 2024-01-22 LAB — BASIC METABOLIC PANEL
Anion gap: 8 (ref 5–15)
BUN: 61 mg/dL — ABNORMAL HIGH (ref 8–23)
CO2: 19 mmol/L — ABNORMAL LOW (ref 22–32)
Calcium: 8.3 mg/dL — ABNORMAL LOW (ref 8.9–10.3)
Chloride: 108 mmol/L (ref 98–111)
Creatinine, Ser: 0.83 mg/dL (ref 0.44–1.00)
GFR, Estimated: >60 mL/min (ref >60)
Glucose, Bld: 111 mg/dL — ABNORMAL HIGH (ref 70–99)
Potassium: 4.1 mmol/L (ref 3.5–5.1)
Sodium: 135 mmol/L (ref 135–145)

## 2024-01-22 LAB — GLUCOSE, CAPILLARY
Glucose-Capillary: 109 mg/dL — ABNORMAL HIGH (ref 70–99)
Glucose-Capillary: 110 mg/dL — ABNORMAL HIGH (ref 70–99)
Glucose-Capillary: 112 mg/dL — ABNORMAL HIGH (ref 70–99)
Glucose-Capillary: 150 mg/dL — ABNORMAL HIGH (ref 70–99)
Glucose-Capillary: 175 mg/dL — ABNORMAL HIGH (ref 70–99)
Glucose-Capillary: 95 mg/dL (ref 70–99)

## 2024-01-22 LAB — PREPARE RBC (CROSSMATCH)

## 2024-01-22 MED ORDER — SODIUM CHLORIDE 0.9 % IV SOLN: INTRAVENOUS | Status: AC

## 2024-01-22 MED ORDER — DIPHENHYDRAMINE HCL 25 MG PO CAPS
25.0000 mg | ORAL_CAPSULE | Freq: Once | ORAL | Status: AC
Start: 2024-01-22 — End: 2024-01-22
  Administered 2024-01-22: 25 mg via ORAL
  Filled 2024-01-22: qty 1

## 2024-01-22 MED ORDER — ACETAMINOPHEN 325 MG PO TABS
650.0000 mg | ORAL_TABLET | Freq: Once | ORAL | Status: AC
  Administered 2024-01-22: 650 mg via ORAL
  Filled 2024-01-22: qty 2

## 2024-01-22 MED ORDER — SODIUM CHLORIDE 0.9% IV SOLUTION: Freq: Once | INTRAVENOUS | Status: AC

## 2024-01-22 MED ORDER — FUROSEMIDE 10 MG/ML IJ SOLN
20.0000 mg | Freq: Once | INTRAMUSCULAR | Status: AC
  Administered 2024-01-22: 20 mg via INTRAVENOUS
  Filled 2024-01-22: qty 2

## 2024-01-22 NOTE — Plan of Care (Signed)
  Problem: Education: Goal: Ability to describe self-care measures that may prevent or decrease complications (Diabetes Survival Skills Education) will improve Outcome: Progressing Goal: Individualized Educational Video(s) Outcome: Progressing   Problem: Coping: Goal: Ability to adjust to condition or change in health will improve Outcome: Progressing   Problem: Fluid Volume: Goal: Ability to maintain a balanced intake and output will improve Outcome: Progressing   Problem: Health Behavior/Discharge Planning: Goal: Ability to identify and utilize available resources and services will improve Outcome: Progressing Goal: Ability to manage health-related needs will improve Outcome: Progressing   Problem: Metabolic: Goal: Ability to maintain appropriate glucose levels will improve Outcome: Progressing   Problem: Nutritional: Goal: Maintenance of adequate nutrition will improve Outcome: Progressing Goal: Progress toward achieving an optimal weight will improve Outcome: Progressing   Problem: Skin Integrity: Goal: Risk for impaired skin integrity will decrease Outcome: Progressing   Problem: Tissue Perfusion: Goal: Adequacy of tissue perfusion will improve Outcome: Progressing   Problem: Education: Goal: Knowledge of condition and prescribed therapy will improve Outcome: Progressing   Problem: Cardiac: Goal: Will achieve and/or maintain adequate cardiac output Outcome: Progressing   Problem: Physical Regulation: Goal: Complications related to the disease process, condition or treatment will be avoided or minimized Outcome: Progressing   Problem: Education: Goal: Knowledge of General Education information will improve Description: Including pain rating scale, medication(s)/side effects and non-pharmacologic comfort measures Outcome: Progressing   Problem: Health Behavior/Discharge Planning: Goal: Ability to manage health-related needs will improve Outcome:  Progressing   Problem: Clinical Measurements: Goal: Ability to maintain clinical measurements within normal limits will improve Outcome: Progressing Goal: Will remain free from infection Outcome: Progressing Goal: Diagnostic test results will improve Outcome: Progressing Goal: Respiratory complications will improve Outcome: Progressing Goal: Cardiovascular complication will be avoided Outcome: Progressing   Problem: Activity: Goal: Risk for activity intolerance will decrease Outcome: Progressing   Problem: Nutrition: Goal: Adequate nutrition will be maintained Outcome: Progressing   Problem: Coping: Goal: Level of anxiety will decrease Outcome: Progressing   Problem: Elimination: Goal: Will not experience complications related to bowel motility Outcome: Progressing Goal: Will not experience complications related to urinary retention Outcome: Progressing   Problem: Pain Managment: Goal: General experience of comfort will improve and/or be controlled Outcome: Progressing   Problem: Safety: Goal: Ability to remain free from injury will improve Outcome: Progressing   Problem: Skin Integrity: Goal: Risk for impaired skin integrity will decrease Outcome: Progressing

## 2024-01-22 NOTE — Telephone Encounter (Signed)
Copied from CRM [id number]. Topic: General - Other >> Jan 22, 2024  8:13 AM Truddie Crumble wrote: Reason for CRM: Amada Jupiter (son) called stating the patient has been in hospital since Friday and he want the doctor to check in with them [phone]

## 2024-01-22 NOTE — Progress Notes (Signed)
TRH ROUNDING   NOTE Breanna [name] ZOX:[id number]  DOB: 1931-08-21  DOA: 01/17/2024  PCP: Tresa Garter, MD  01/22/2024,7:43 AM   LOS: 2 days      Code Status: Full code From: Home  current Dispo: Unclear   89 year old female Previous history left shoulder rotator cuff injury followed with Dr. 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 Last EGD 04/28/2010 = presbyesophagus dilated with 54 [place] Maloney multiple gastric polyps, gout stage IIIb CKD-at baseline is blind 9 mm hyperdense left superior pole kidney lesion  Events 01/17/2024 admission several days malaise chills lightheadedness on standing brief LOC with assisted fall--- found to have gross blood on DRE aspirin was held transfused 1 unit GI consulted Sodium 141 potassium 3.9 BUN/creatinine 61/1.1 (usually creatinine less than 1.3) Hemoglobin baseline October 2024 10-7.3 on arrival, platelet 105, WBC 8.4, respiratory viral panel    UA negative  procedures 1/25 transfused 1/26 upper endoscopy normal esophagus multiple polyps normal mucosa no areas of active bleeding 1/27 colonoscopy diverticulosis entire examined colon-5 mm cecal polyp not removed    Plan  Possible small bowel bleed Colonoscopy performedUpper Endo--no overt source-May need VCE versus other workup if continues to drop hemoglobin--use full liquid diet for now Could be secondary to polyp but was not removed-aspirin has been held If large amount of bleeding per rectum or hypotension would get stat CTA and may need embolization Anemia of acute blood loss Transfused several times this hospital stay-hemoglobin is dropped again today below threshold so transfuse 1 more unit Repeat labs a.m. Check iron studies Syncope on admission secondary to anemia  Do not think he has been out of bed so we will ask therapy to eval Blood pressures are stable-would start  IV fluid 40 cc/H-careful fluid status HTN HFpEF EF 1/25--ef 60-65% grade 2 DD Continue amlodipine 5, losartan 50 has been held Aldactone Monday Wednesday Friday held Would not resume some of these meds in the outpatient setting Hypernatremia Overall seems improved Monitor trends on labs Diabetes mellitus type 2 A1c has always been below seven 6.8 last check Continues on sliding scale-CBG 90s to 120 Metformin has been held from prior to admission May continue gabapentin 100 twice daily for neuropathy Prior CVA Aspirin on hold At baseline blind--compensates well  DVT prophylaxis: Contraindicated  Status is: Inpatient Remains inpatient appropriate because:   Still bleeding not ready for discharge  Subjective: Awake coherent alert no distress looks fair feels comfortable 1 darker stool semiformed.today   Objective + exam Vitals:   01/21/24 1126 01/21/24 2036 01/22/24 0500 01/22/24 0522  BP: 127/78 (!) 141/64  (!) 136/59  Pulse: (!) 58 (!) 59  62  Resp: 18 18  18   Temp: 99.5 F (37.5 C) 97.9 F (36.6 C)  98.4 F (36.9 C)  TempSrc: Oral     SpO2: 99% 100%  100%  Weight:   49.6 kg   Height:       Filed Weights   01/20/24 0500 01/21/24 0359 01/22/24 0500  Weight: 47.5 kg 49.6 kg 49.6 kg    Examination: Awake coherent legally blind Neck changes to eyes bilaterally Chest is clear no wheeze rales rhonchi no abdominal tenderness No rebound  power is 5/5  Data Reviewed: reviewed   CBC    Component Value Date/Time   WBC 9.4 01/21/2024 0605   RBC 2.77 (L) 01/21/2024 0605   HGB 7.9 (L) 01/21/2024 8119  HCT 23.8 (L) 01/21/2024 0605   PLT 80 (L) 01/21/2024 0605   MCV 85.9 01/21/2024 0605   MCH 28.5 01/21/2024 0605   MCHC 33.2 01/21/2024 0605   RDW 16.3 (H) 01/21/2024 0605   LYMPHSABS 0.8 01/17/2024 1730   MONOABS 0.3 01/17/2024 1730   EOSABS 0.1 01/17/2024 1730   BASOSABS 0.0 01/17/2024 1730      Latest Ref Rng & Units 01/21/2024    6:05 AM 01/20/2024    1:39  PM 01/20/2024    5:11 AM  CMP  Glucose 70 - 99 mg/dL 130  865  784   BUN 8 - 23 mg/dL 63  57  68   Creatinine 0.44 - 1.00 mg/dL 6.96  2.95  2.84   Sodium 135 - 145 mmol/L 136  151  146   Potassium 3.5 - 5.1 mmol/L 3.6  3.6  3.4   Chloride 98 - 111 mmol/L 108  116  117   CO2 22 - 32 mmol/L 22   20   Calcium 8.9 - 10.3 mg/dL 8.1   8.7     Scheduled Meds:  sodium chloride   Intravenous Once   allopurinol  50 mg Oral Daily   amLODipine  5 mg Oral Daily   ascorbic acid  500 mg Oral Daily   dorzolamide-timolol  1 drop Both Eyes BID   feeding supplement  1 Container Oral TID BM   gabapentin  100 mg Oral BID   insulin aspart  0-6 Units Subcutaneous Q4H   latanoprost  1 drop Both Eyes QHS   sertraline  100 mg Oral Daily   sodium chloride flush  3 mL Intravenous Q12H   Continuous Infusions:  Time  44  Rhetta Mura, MD  [facility]

## 2024-01-22 NOTE — Progress Notes (Addendum)
Progress Note  Primary GI: Unassigned (previously followed with Dr. Juanda Chance)  DOA: 01/17/2024         Hospital Day: 6   Subjective  Chief Complaint: Symptomatic anemia syncope FOBT positive  Patient with family at bedside, daughter. Provided some of the history.  Patient overall doing well had 1 small volume darker stool this morning per the nurse. Patient has been tolerating clear liquid diet well no nausea, vomiting no abdominal pain.    Objective   Vital signs in last 24 hours: Temp:  [97.9 F (36.6 C)-99.5 F (37.5 C)] 98.4 F (36.9 C) (01/29 0522) Pulse Rate:  [58-62] 62 (01/29 0522) Resp:  [18] 18 (01/29 0522) BP: (127-141)/(59-78) 136/59 (01/29 0522) SpO2:  [99 %-100 %] 100 % (01/29 0522) Weight:  [49.6 kg] 49.6 kg (01/29 0500) Last BM Date : 01/20/24 Last BM recorded by nurses in past 5 days Stool Type: Type 7 (Liquid consistency with no solid pieces) (01/22/2024 10:00 AM)  General:   female in no acute distress  Heart:  Regular rate and rhythm; no murmurs Pulm: Clear anteriorly; no wheezing Abdomen:  Soft, Non-distended AB, Active bowel sounds. No tenderness . Extremities:  without  edema. Neurologic:  Alert and  oriented x4;  No focal deficits.  Psych:  Cooperative. Normal mood and affect.  Intake/Output from previous day: 01/28 0701 - 01/29 0700 In: 390 [P.O.:390] Out: 1550 [Urine:1550] Intake/Output this shift: Total I/O In: 240 [P.O.:240] Out: -   Studies/Results: No results found.  Lab Results: Recent Labs    01/20/24 1701 01/21/24 0605 01/22/24 0604  WBC 8.7 9.4 8.0  HGB 7.5* 7.9* 6.7*  HCT 23.5* 23.8* 20.7*  PLT 86* 80* 83*   BMET Recent Labs    01/20/24 0511 01/20/24 1339 01/21/24 0605 01/22/24 0604  NA 146* 151* 136 135  K 3.4* 3.6 3.6 4.1  CL 117* 116* 108 108  CO2 20*  --  22 19*  GLUCOSE 123* 116* 130* 111*  BUN 68* 57* 63* 61*  CREATININE 1.24* 1.40* 1.19* 0.83  CALCIUM 8.7*  --  8.1* 8.3*   LFT No results for  input(s): "PROT", "ALBUMIN", "AST", "ALT", "ALKPHOS", "BILITOT", "BILIDIR", "IBILI" in the last 72 hours. PT/INR No results for input(s): "LABPROT", "INR" in the last 72 hours.   Scheduled Meds:  sodium chloride   Intravenous Once   sodium chloride   Intravenous Once   acetaminophen  650 mg Oral Once   allopurinol  50 mg Oral Daily   amLODipine  5 mg Oral Daily   ascorbic acid  500 mg Oral Daily   diphenhydrAMINE  25 mg Oral Once   dorzolamide-timolol  1 drop Both Eyes BID   feeding supplement  1 Container Oral TID BM   furosemide  20 mg Intravenous Once   gabapentin  100 mg Oral BID   insulin aspart  0-6 Units Subcutaneous Q4H   latanoprost  1 drop Both Eyes QHS   sertraline  100 mg Oral Daily   sodium chloride flush  3 mL Intravenous Q12H   Continuous Infusions:  Objective    Colonoscopy 1/27 - Diverticulosis in the entire examined colon with associated about the colon without an active site of bleeding identified. Most consistent with. - The examination was otherwise normal on direct and retroflexion views, save diminutive cecal polyp which was not removed and prior sigmoid resection resection. - No specimens collected.   EGD 1/26 - Normal esophagus. - Multiple gastric polyps. - Normal mucosa  was found in the entire stomach. - Normal examined duodenum. - No areas of active bleeding, stigmata of recent bleeding, or heme noted on this study. - No specimens collected     Patient profile:   Breanna [name] is a 89 y.o. year old female with a history of HTN, diabetes, CKD 3, CVA, gout, CHF, blindness, sigmoid colectomy 2006.  Admitted 1/24 with symptomatic anemia, FOBT+. GI consulted 1/25.    Impression/Plan:   Symptomatic anemia FOBT positive EGD 1/26 no areas of active bleeding no specimens collected Colonoscopy 1/27 with diverticulosis throughout the entire examined colon without active bleeding no specimens collected Most likely this still represents diverticular  bleed Hgb on admission 6.4 increased to 7.9 after 2 PRBCs (total of 3 this admission) Patient had small-volume stool dark, possible old blood Hgb 6.7 this morning down from 7.9 -Most likely small volume dark stool was likely old blood no overt GI bleeding. Drift of hemoglobin from 7.9-6.7 -Continue to monitor Hgb transfuse to keep greater than 7.5 -Advance diet to full liquids, will be able to advance further tomorrow if patient has no further GI bleeding -Will continue to monitor CBC overnight any overt GI bleeding or large-volume consider repeat CTA  Chronic thrombocytopenia Stable  Diminutive cecal polyp Not removed secondary to age, and not removed during active bleeding  Principal Problem:   Syncope Active Problems:   DM2 (diabetes mellitus, type 2) (HCC)   Essential hypertension   History of cardioembolic cerebrovascular accident (CVA)   Diarrhea   Normocytic anemia   Elevated troponin   Stage 3b chronic kidney disease (CKD) (HCC)   Thrombocytopenia (HCC)   ABLA (acute blood loss anemia)   Heme positive stool   Symptomatic anemia   Diverticulosis of colon with hemorrhage    LOS: 2 days   Doree Albee  01/22/2024, 10:46 AM  GI ATTENDING  Interval history and data personally reviewed.  Patient personally seen and examined.  Agree with interval progress note as outlined above.  Several family members in room.  Patient doing well.  Sitting comfortably in bed.  Tolerating limited diet.  Had 1 small bowel movement today.  Drift in hemoglobin noted.  And transfuse.  Continue to monitor closely for recent acute diverticular bleed.  Wilhemina Bonito. Eda Keys., M.D. [facility] Division of Gastroenterology

## 2024-01-23 DIAGNOSIS — K922 Gastrointestinal hemorrhage, unspecified: Secondary | ICD-10-CM | POA: Diagnosis not present

## 2024-01-23 DIAGNOSIS — D696 Thrombocytopenia, unspecified: Secondary | ICD-10-CM | POA: Diagnosis not present

## 2024-01-23 DIAGNOSIS — D62 Acute posthemorrhagic anemia: Secondary | ICD-10-CM | POA: Diagnosis not present

## 2024-01-23 DIAGNOSIS — R55 Syncope and collapse: Secondary | ICD-10-CM | POA: Diagnosis not present

## 2024-01-23 DIAGNOSIS — R195 Other fecal abnormalities: Secondary | ICD-10-CM | POA: Diagnosis not present

## 2024-01-23 LAB — HEMOGLOBIN AND HEMATOCRIT, BLOOD
HCT: 20.5 % — ABNORMAL LOW (ref 36.0–46.0)
HCT: 22.5 % — ABNORMAL LOW (ref 36.0–46.0)
HCT: 21.6 % — ABNORMAL LOW (ref 36.0–46.0)
Hemoglobin: 6.8 g/dL — CL (ref 12.0–15.0)
Hemoglobin: 7.3 g/dL — ABNORMAL LOW (ref 12.0–15.0)
Hemoglobin: 7 g/dL — ABNORMAL LOW (ref 12.0–15.0)

## 2024-01-23 LAB — TYPE AND SCREEN
ABO/RH(D): AB POS
Antibody Screen: POSITIVE
Donor AG Type: NEGATIVE
Donor AG Type: NEGATIVE
Donor AG Type: NEGATIVE
Unit division: 0
Unit division: 0
Unit division: 0

## 2024-01-23 LAB — CBC WITH DIFFERENTIAL/PLATELET
Abs Immature Granulocytes: 0.26 10*3/uL — ABNORMAL HIGH (ref 0.00–0.07)
Basophils Absolute: 0 10*3/uL (ref 0.0–0.1)
Basophils Relative: 0 %
Eosinophils Absolute: 0.2 10*3/uL (ref 0.0–0.5)
Eosinophils Relative: 3 %
HCT: 23.1 % — ABNORMAL LOW (ref 36.0–46.0)
Hemoglobin: 7.7 g/dL — ABNORMAL LOW (ref 12.0–15.0)
Immature Granulocytes: 4 %
Lymphocytes Relative: 17 %
Lymphs Abs: 1.3 10*3/uL (ref 0.7–4.0)
MCH: 29.7 pg (ref 26.0–34.0)
MCHC: 33.3 g/dL (ref 30.0–36.0)
MCV: 89.2 fL (ref 80.0–100.0)
Monocytes Absolute: 0.9 10*3/uL (ref 0.1–1.0)
Monocytes Relative: 11 %
Neutro Abs: 4.9 10*3/uL (ref 1.7–7.7)
Neutrophils Relative %: 65 %
Platelets: 83 10*3/uL — ABNORMAL LOW (ref 150–400)
RBC: 2.59 MIL/uL — ABNORMAL LOW (ref 3.87–5.11)
RDW: 16.2 % — ABNORMAL HIGH (ref 11.5–15.5)
WBC: 7.5 10*3/uL (ref 4.0–10.5)
nRBC: 1.2 % — ABNORMAL HIGH (ref 0.0–0.2)

## 2024-01-23 LAB — BPAM RBC
Blood Product Expiration Date: 202502242359
Blood Product Expiration Date: 202502242359
Blood Product Expiration Date: 202502242359
ISSUE DATE / TIME: 202501291709
Unit Type and Rh: 6200
Unit Type and Rh: 6200
Unit Type and Rh: 6200

## 2024-01-23 LAB — BASIC METABOLIC PANEL
Anion gap: 7 (ref 5–15)
BUN: 61 mg/dL — ABNORMAL HIGH (ref 8–23)
CO2: 20 mmol/L — ABNORMAL LOW (ref 22–32)
Calcium: 8.2 mg/dL — ABNORMAL LOW (ref 8.9–10.3)
Chloride: 113 mmol/L — ABNORMAL HIGH (ref 98–111)
Creatinine, Ser: 1.1 mg/dL — ABNORMAL HIGH (ref 0.44–1.00)
GFR, Estimated: 48 mL/min — ABNORMAL LOW (ref >60)
Glucose, Bld: 97 mg/dL (ref 70–99)
Potassium: 4.1 mmol/L (ref 3.5–5.1)
Sodium: 140 mmol/L (ref 135–145)

## 2024-01-23 LAB — PREPARE RBC (CROSSMATCH)

## 2024-01-23 LAB — GLUCOSE, CAPILLARY
Glucose-Capillary: 95 mg/dL (ref 70–99)
Glucose-Capillary: 94 mg/dL (ref 70–99)
Glucose-Capillary: 128 mg/dL — ABNORMAL HIGH (ref 70–99)
Glucose-Capillary: 143 mg/dL — ABNORMAL HIGH (ref 70–99)
Glucose-Capillary: 108 mg/dL — ABNORMAL HIGH (ref 70–99)

## 2024-01-23 MED ORDER — ACETAMINOPHEN 325 MG PO TABS
650.0000 mg | ORAL_TABLET | Freq: Once | ORAL | Status: AC
  Administered 2024-01-23: 650 mg via ORAL
  Filled 2024-01-23: qty 2

## 2024-01-23 MED ORDER — FUROSEMIDE 10 MG/ML IJ SOLN
20.0000 mg | Freq: Once | INTRAMUSCULAR | Status: AC
  Administered 2024-01-23: 20 mg via INTRAVENOUS
  Filled 2024-01-23: qty 2

## 2024-01-23 MED ORDER — DIPHENHYDRAMINE HCL 25 MG PO CAPS
25.0000 mg | ORAL_CAPSULE | Freq: Once | ORAL | Status: AC
  Administered 2024-01-23: 25 mg via ORAL
  Filled 2024-01-23: qty 1

## 2024-01-23 MED ORDER — SODIUM CHLORIDE 0.9% IV SOLUTION: Freq: Once | INTRAVENOUS | Status: AC

## 2024-01-23 NOTE — Progress Notes (Signed)
Lab informed nurse of critical lab value. Hgb 6.8 MD paged.

## 2024-01-23 NOTE — Progress Notes (Signed)
TRH ROUNDING   NOTE Breanna [name] WNU:[id number]  DOB: 06/01/1931  DOA: 01/17/2024  PCP: Tresa Garter, MD  01/23/2024,10:50 AM   LOS: 3 days      Code Status: Full code From: Home  current Dispo: Unclear   89 year old female Previous history left shoulder rotator cuff injury followed with Dr. 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 Last EGD 04/28/2010 = presbyesophagus dilated with 54 French Maloney multiple gastric polyps, gout stage IIIb CKD-at baseline is blind 9 mm hyperdense left superior pole kidney lesion  Events 01/17/2024 admission several days malaise chills lightheadedness on standing brief LOC with assisted fall--- found to have gross blood on DRE aspirin was held transfused 1 unit GI consulted Sodium 141 potassium 3.9 BUN/creatinine 61/1.1 (usually creatinine less than 1.3) Hemoglobin baseline October 2024 10-7.3 on arrival, platelet 105, WBC 8.4, respiratory viral panel    UA negative  procedures 1/25 transfused 1/26 upper endoscopy normal esophagus multiple polyps normal mucosa no areas of active bleeding 1/27 colonoscopy diverticulosis entire examined colon-5 mm cecal polyp not removed 1/29 transfused 1 U prbc again    Plan  Possible small bowel bleed Colonoscopy performedUpper Endo--no overt source-deferring to GI--?VCE versus other workup --upgraded to soft diet by GI hopeful no further bleeding Could be secondary to polyp but was not removed-aspirin has been held If frank melena--stat CTA and may need embolization If remains stable in the next 24 to 48 hours probably can discharge Anemia of acute blood loss Transfused several times this hospital stay-await stability Repeat labs a.m. and in am would check  iron studies  Syncope on admission secondary to anemia  Do not think he has been out of bed so we will ask therapy to eval Blood pressures are  stable-would start IV fluid 40 cc/H-careful fluid status HTN HFpEF EF 1/25--ef 60-65% grade 2 DD Continue amlodipine 5,  losartan 50 has been held Aldactone Monday Wednesday Friday held Would not resume some of these meds in the outpatient setting Hypernatremia azotemia Overall seems improved Azotemia requires low rate of IVF 40 cc/h--think this may be partially from the bleeding she has been having Diabetes mellitus type 2 A1c has always been below seven 6.8 last check Continues on sliding scale-CBG 90-170 Metformin has been held from prior to admission and may not resume May continue gabapentin 100 twice daily for neuropathy Prior CVA Aspirin on hold At baseline blind--compensates well  DVT prophylaxis: Contraindicated  Status is: Inpatient Remains inpatient appropriate because:   Still bleeding not ready for discharge  Subjective:  She looks well feels fair she feels stronger than yesterday no chest pain no fever no chills no nausea no vomiting Tolerating liquid diet No further stool No nausea  Objective + exam Vitals:   01/22/24 1731 01/22/24 2004 01/23/24 0412 01/23/24 0500  BP: (!) 107/48 127/60 (!) 145/58   Pulse: 63 61 (!) 54   Resp: 16 18 18    Temp: 98.5 F (36.9 C) 98 F (36.7 C) 98 F (36.7 C)   TempSrc:  Oral Oral   SpO2:  100% 100%   Weight:    49.8 kg  Height:       Filed Weights   01/21/24 0359 01/22/24 0500 01/23/24 0500  Weight: 49.6 kg 49.6 kg 49.8 kg    Examination:  EOMI NCAT no focal deficit no icterus no pallor Blind in both eyes Chest is clear no wheeze  rales rhonchi ROM intact Power 5/5 grossly Psych euthymic   Data Reviewed: reviewed   CBC    Component Value Date/Time   WBC 7.5 01/23/2024 0546   RBC 2.59 (L) 01/23/2024 0546   HGB 7.7 (L) 01/23/2024 0546   HCT 23.1 (L) 01/23/2024 0546   PLT 83 (L) 01/23/2024 0546   MCV 89.2 01/23/2024 0546   MCH 29.7 01/23/2024 0546   MCHC 33.3 01/23/2024 0546   RDW 16.2 (H) 01/23/2024  0546   LYMPHSABS 1.3 01/23/2024 0546   MONOABS 0.9 01/23/2024 0546   EOSABS 0.2 01/23/2024 0546   BASOSABS 0.0 01/23/2024 0546      Latest Ref Rng & Units 01/23/2024    5:46 AM 01/22/2024    6:04 AM 01/21/2024    6:05 AM  CMP  Glucose 70 - 99 mg/dL 97  604  540   BUN 8 - 23 mg/dL 61  61  63   Creatinine 0.44 - 1.00 mg/dL 9.81  1.91  4.78   Sodium 135 - 145 mmol/L 140  135  136   Potassium 3.5 - 5.1 mmol/L 4.1  4.1  3.6   Chloride 98 - 111 mmol/L 113  108  108   CO2 22 - 32 mmol/L 20  19  22    Calcium 8.9 - 10.3 mg/dL 8.2  8.3  8.1     Scheduled Meds:  allopurinol  50 mg Oral Daily   amLODipine  5 mg Oral Daily   ascorbic acid  500 mg Oral Daily   dorzolamide-timolol  1 drop Both Eyes BID   feeding supplement  1 Container Oral TID BM   gabapentin  100 mg Oral BID   insulin aspart  0-6 Units Subcutaneous Q4H   latanoprost  1 drop Both Eyes QHS   sertraline  100 mg Oral Daily   sodium chloride flush  3 mL Intravenous Q12H   Continuous Infusions:  sodium chloride 40 mL/hr at 01/22/24 2035    Time  44  Rhetta Mura, MD  [facility]

## 2024-01-23 NOTE — Plan of Care (Signed)
  Problem: Education: Goal: Ability to describe self-care measures that may prevent or decrease complications (Diabetes Survival Skills Education) will improve Outcome: Progressing Goal: Individualized Educational Video(s) Outcome: Progressing   Problem: Coping: Goal: Ability to adjust to condition or change in health will improve Outcome: Progressing   Problem: Fluid Volume: Goal: Ability to maintain a balanced intake and output will improve Outcome: Progressing   Problem: Health Behavior/Discharge Planning: Goal: Ability to identify and utilize available resources and services will improve Outcome: Progressing Goal: Ability to manage health-related needs will improve Outcome: Progressing   Problem: Metabolic: Goal: Ability to maintain appropriate glucose levels will improve Outcome: Progressing   Problem: Nutritional: Goal: Maintenance of adequate nutrition will improve Outcome: Progressing Goal: Progress toward achieving an optimal weight will improve Outcome: Progressing   Problem: Skin Integrity: Goal: Risk for impaired skin integrity will decrease Outcome: Progressing   Problem: Tissue Perfusion: Goal: Adequacy of tissue perfusion will improve Outcome: Progressing   Problem: Education: Goal: Knowledge of condition and prescribed therapy will improve Outcome: Progressing   Problem: Cardiac: Goal: Will achieve and/or maintain adequate cardiac output Outcome: Progressing   Problem: Physical Regulation: Goal: Complications related to the disease process, condition or treatment will be avoided or minimized Outcome: Progressing   Problem: Education: Goal: Knowledge of General Education information will improve Description: Including pain rating scale, medication(s)/side effects and non-pharmacologic comfort measures Outcome: Progressing   Problem: Health Behavior/Discharge Planning: Goal: Ability to manage health-related needs will improve Outcome:  Progressing   Problem: Clinical Measurements: Goal: Ability to maintain clinical measurements within normal limits will improve Outcome: Progressing Goal: Will remain free from infection Outcome: Progressing Goal: Diagnostic test results will improve Outcome: Progressing Goal: Respiratory complications will improve Outcome: Progressing Goal: Cardiovascular complication will be avoided Outcome: Progressing   Problem: Activity: Goal: Risk for activity intolerance will decrease Outcome: Progressing   Problem: Nutrition: Goal: Adequate nutrition will be maintained Outcome: Progressing   Problem: Coping: Goal: Level of anxiety will decrease Outcome: Progressing   Problem: Elimination: Goal: Will not experience complications related to bowel motility Outcome: Progressing Goal: Will not experience complications related to urinary retention Outcome: Progressing   Problem: Pain Managment: Goal: General experience of comfort will improve and/or be controlled Outcome: Progressing   Problem: Safety: Goal: Ability to remain free from injury will improve Outcome: Progressing   Problem: Skin Integrity: Goal: Risk for impaired skin integrity will decrease Outcome: Progressing

## 2024-01-23 NOTE — Progress Notes (Signed)
Progress Note  Primary GI: Unassigned (previously followed with Dr. Juanda Chance)  DOA: 01/17/2024         Hospital Day: 7   Subjective  Chief Complaint: Symptomatic anemia syncope FOBT positive  Patient with family at bedside, son. Provided some of the history.  No further bowel movements from yesterday morning.  Patient had 1 PRBC and states she is feeling better.   Patient has been tolerating full liquid diet well no nausea, vomiting no abdominal pain.     Objective   Vital signs in last 24 hours: Temp:  [98 F (36.7 C)-98.8 F (37.1 C)] 98 F (36.7 C) (01/30 0412) Pulse Rate:  [54-66] 54 (01/30 0412) Resp:  [16-18] 18 (01/30 0412) BP: (107-149)/(48-68) 145/58 (01/30 0412) SpO2:  [100 %] 100 % (01/30 0412) Weight:  [49.8 kg] 49.8 kg (01/30 0500) Last BM Date : 01/22/24 Last BM recorded by nurses in past 5 days Stool Type: Type 7 (Liquid consistency with no solid pieces) (01/22/2024 10:00 AM)  General:   female in no acute distress  Heart:  Regular rate and rhythm; no murmurs Pulm: Clear anteriorly; no wheezing Abdomen:  Soft, Non-distended AB, Active bowel sounds. No tenderness . Extremities:  without  edema. Neurologic:  Alert and  oriented x4;  No focal deficits.  Psych:  Cooperative. Normal mood and affect.  Intake/Output from previous day: 01/29 0701 - 01/30 0700 In: 641.2 [P.O.:240; I.V.:401.2] Out: -  Intake/Output this shift: No intake/output data recorded.  Studies/Results: No results found.  Lab Results: Recent Labs    01/21/24 0605 01/22/24 0604 01/23/24 0546  WBC 9.4 8.0 7.5  HGB 7.9* 6.7* 7.7*  HCT 23.8* 20.7* 23.1*  PLT 80* 83* 83*   BMET Recent Labs    01/21/24 0605 01/22/24 0604 01/23/24 0546  NA 136 135 140  K 3.6 4.1 4.1  CL 108 108 113*  CO2 22 19* 20*  GLUCOSE 130* 111* 97  BUN 63* 61* 61*  CREATININE 1.19* 0.83 1.10*  CALCIUM 8.1* 8.3* 8.2*   LFT No results for input(s): "PROT", "ALBUMIN", "AST", "ALT", "ALKPHOS",  "BILITOT", "BILIDIR", "IBILI" in the last 72 hours. PT/INR No results for input(s): "LABPROT", "INR" in the last 72 hours.   Scheduled Meds:  allopurinol  50 mg Oral Daily   amLODipine  5 mg Oral Daily   ascorbic acid  500 mg Oral Daily   dorzolamide-timolol  1 drop Both Eyes BID   feeding supplement  1 Container Oral TID BM   gabapentin  100 mg Oral BID   insulin aspart  0-6 Units Subcutaneous Q4H   latanoprost  1 drop Both Eyes QHS   sertraline  100 mg Oral Daily   sodium chloride flush  3 mL Intravenous Q12H   Continuous Infusions:  sodium chloride 40 mL/hr at 01/22/24 2035    Objective    Colonoscopy 1/27 - Diverticulosis in the entire examined colon with associated about the colon without an active site of bleeding identified. Most consistent with. - The examination was otherwise normal on direct and retroflexion views, save diminutive cecal polyp which was not removed and prior sigmoid resection resection. - No specimens collected.   EGD 1/26 - Normal esophagus. - Multiple gastric polyps. - Normal mucosa was found in the entire stomach. - Normal examined duodenum. - No areas of active bleeding, stigmata of recent bleeding, or heme noted on this study. - No specimens collected     Patient profile:   [address] [place] is  a 89 y.o. year old female with a history of HTN, diabetes, CKD 3, CVA, gout, CHF, blindness, sigmoid colectomy 2006.  Admitted 1/24 with symptomatic anemia, FOBT+. GI consulted 1/25.    Impression/Plan:   Symptomatic anemia FOBT positive EGD 1/26 no areas of active bleeding no specimens collected Colonoscopy 1/27 with diverticulosis throughout the entire examined colon without active bleeding no specimens collected Most likely this still represents diverticular bleed Hgb on admission 6.4 increased to 7.9 after 2 PRBCs (total of 3 this admission) -Small volume dark stool was likely old blood no overt GI bleeding yesterday. Drift of hemoglobin from 7.9-6.7,  status post 1 PRBC with good response 7.7 this morning, hopefully that if this is diverticular bleed this is stopped. -Continue to monitor Hgb transfuse to keep greater than 7.5 -Will advance diet to soft diet -Any overt GI bleeding or large-volume consider repeat CTA -Would suggest outpatient monitoring serial CBC and son monitoring stools as patient is blind when patient does get discharge  Chronic thrombocytopenia Stable at 85  Diminutive cecal polyp Not removed secondary GI bleeding  Principal Problem:   Syncope Active Problems:   DM2 (diabetes mellitus, type 2) (HCC)   Essential hypertension   History of cardioembolic cerebrovascular accident (CVA)   Diarrhea   Normocytic anemia   Elevated troponin   Stage 3b chronic kidney disease (CKD) (HCC)   Thrombocytopenia (HCC)   ABLA (acute blood loss anemia)   Heme positive stool   Symptomatic anemia   Diverticulosis of colon with hemorrhage   Acute blood loss anemia    LOS: 3 days   Doree Albee  01/23/2024, 8:17 AM

## 2024-01-23 NOTE — Consult Note (Signed)
[facility] Liaison Consult Note   01/23/2024  Breanna [name] [birth year] [id number]  Insurance: [facility]    Primary Care Provider: Tresa Garter, MD, with [place] [facility], this provider is listed for the transition of care follow up appointments  and [facility] calls   [facility] Liaison screened the patient remotely at [facility].    The patient was screened for previously active with Psychologist, educational.  5 day readmission hospitalization with noted rising to high risk score for unplanned readmission risk 1 hospital admissions in 6 months.  The patient was assessed for potential Community Care Coordination service needs for post hospital transition for care coordination. Review of patient's electronic medical record reveals patient is admitted with Symptomatic Anemia..   Plan: [facility] Liaison will continue to follow progress and disposition to asess for post hospital community care coordination/management needs.  Referral request for community care coordination: Reached to [facility] LCSW via secure chat. Continue to follow for post hospital needs.   VBCI Community Care, Population Health does not replace or interfere with any arrangements made by the Inpatient Transition of Care team.   For questions contact:   Charlesetta Shanks, RN, BSN, CCM [place]  [facility], [facility] Liaison Direct Dial: [phone] or secure chat Email: [email or web address]

## 2024-01-24 DIAGNOSIS — D509 Iron deficiency anemia, unspecified: Secondary | ICD-10-CM | POA: Diagnosis not present

## 2024-01-24 DIAGNOSIS — R195 Other fecal abnormalities: Secondary | ICD-10-CM | POA: Diagnosis not present

## 2024-01-24 DIAGNOSIS — D696 Thrombocytopenia, unspecified: Secondary | ICD-10-CM | POA: Diagnosis not present

## 2024-01-24 DIAGNOSIS — R55 Syncope and collapse: Secondary | ICD-10-CM | POA: Diagnosis not present

## 2024-01-24 LAB — GLUCOSE, CAPILLARY
Glucose-Capillary: 108 mg/dL — ABNORMAL HIGH (ref 70–99)
Glucose-Capillary: 97 mg/dL (ref 70–99)
Glucose-Capillary: 82 mg/dL (ref 70–99)
Glucose-Capillary: 112 mg/dL — ABNORMAL HIGH (ref 70–99)
Glucose-Capillary: 128 mg/dL — ABNORMAL HIGH (ref 70–99)
Glucose-Capillary: 124 mg/dL — ABNORMAL HIGH (ref 70–99)

## 2024-01-24 LAB — FOLATE: Folate: 14.2 ng/mL (ref >5.9)

## 2024-01-24 LAB — CBC WITH DIFFERENTIAL/PLATELET
Abs Immature Granulocytes: 0.2 10*3/uL — ABNORMAL HIGH (ref 0.00–0.07)
Basophils Absolute: 0 10*3/uL (ref 0.0–0.1)
Basophils Relative: 0 %
Eosinophils Absolute: 0.2 10*3/uL (ref 0.0–0.5)
Eosinophils Relative: 3 %
HCT: 23.8 % — ABNORMAL LOW (ref 36.0–46.0)
Hemoglobin: 7.7 g/dL — ABNORMAL LOW (ref 12.0–15.0)
Immature Granulocytes: 3 %
Lymphocytes Relative: 13 %
Lymphs Abs: 1 10*3/uL (ref 0.7–4.0)
MCH: 29.2 pg (ref 26.0–34.0)
MCHC: 32.4 g/dL (ref 30.0–36.0)
MCV: 90.2 fL (ref 80.0–100.0)
Monocytes Absolute: 0.7 10*3/uL (ref 0.1–1.0)
Monocytes Relative: 9 %
Neutro Abs: 5.1 10*3/uL (ref 1.7–7.7)
Neutrophils Relative %: 72 %
Platelets: 109 10*3/uL — ABNORMAL LOW (ref 150–400)
RBC: 2.64 MIL/uL — ABNORMAL LOW (ref 3.87–5.11)
RDW: 16.7 % — ABNORMAL HIGH (ref 11.5–15.5)
WBC: 7.1 10*3/uL (ref 4.0–10.5)
nRBC: 0.3 % — ABNORMAL HIGH (ref 0.0–0.2)

## 2024-01-24 LAB — IRON AND TIBC
Iron: 18 ug/dL — ABNORMAL LOW (ref 28–170)
Saturation Ratios: 6 % — ABNORMAL LOW (ref 10.4–31.8)
TIBC: 283 ug/dL (ref 250–450)
UIBC: 265 ug/dL

## 2024-01-24 LAB — RETICULOCYTES
Immature Retic Fract: 29.9 % — ABNORMAL HIGH (ref 2.3–15.9)
RBC.: 2.64 MIL/uL — ABNORMAL LOW (ref 3.87–5.11)
Retic Count, Absolute: 129.9 10*3/uL (ref 19.0–186.0)
Retic Ct Pct: 4.9 % — ABNORMAL HIGH (ref 0.4–3.1)

## 2024-01-24 LAB — BASIC METABOLIC PANEL
Anion gap: 4 — ABNORMAL LOW (ref 5–15)
BUN: 52 mg/dL — ABNORMAL HIGH (ref 8–23)
CO2: 23 mmol/L (ref 22–32)
Calcium: 8.3 mg/dL — ABNORMAL LOW (ref 8.9–10.3)
Chloride: 111 mmol/L (ref 98–111)
Creatinine, Ser: 1.06 mg/dL — ABNORMAL HIGH (ref 0.44–1.00)
GFR, Estimated: 50 mL/min — ABNORMAL LOW (ref >60)
Glucose, Bld: 113 mg/dL — ABNORMAL HIGH (ref 70–99)
Potassium: 3.4 mmol/L — ABNORMAL LOW (ref 3.5–5.1)
Sodium: 138 mmol/L (ref 135–145)

## 2024-01-24 LAB — FERRITIN: Ferritin: 34 ng/mL (ref 11–307)

## 2024-01-24 LAB — HEMOGLOBIN AND HEMATOCRIT, BLOOD
HCT: 25.2 % — ABNORMAL LOW (ref 36.0–46.0)
Hemoglobin: 7.9 g/dL — ABNORMAL LOW (ref 12.0–15.0)

## 2024-01-24 LAB — VITAMIN B12: Vitamin B-12: 1322 pg/mL — ABNORMAL HIGH (ref 180–914)

## 2024-01-24 MED ORDER — ENSURE ENLIVE PO LIQD
237.0000 mL | Freq: Two times a day (BID) | ORAL | Status: DC
  Administered 2024-01-24 – 2024-01-28 (×8): 237 mL via ORAL

## 2024-01-24 MED ORDER — SODIUM CHLORIDE 0.9 % IV SOLN
100.0000 mg | INTRAVENOUS | Status: AC
  Administered 2024-01-24 – 2024-01-25 (×2): 100 mg via INTRAVENOUS
  Filled 2024-01-24 (×2): qty 5

## 2024-01-24 NOTE — TOC Progression Note (Signed)
Transition of Care [facility]) - Progression Note    Patient Details  Name: Breanna [name] MRN: [id number] Date of Birth: 29-Oct-1931  Transition of Care [facility]) CM/SW Contact  Otelia Santee, LCSW Phone Number: 01/24/2024, 3:17 PM  Clinical Narrative:    Pt and family agreeable to HHPT. Report having home health PT in the past and would like services with the same person. Family unsure which agency pt was with but, will have brother call CSW with HHA information.    Expected Discharge Plan: Home/Self Care    Expected Discharge Plan and Services                                               Social Determinants of Health (SDOH) Interventions SDOH Screenings   Food Insecurity: No Food Insecurity (01/18/2024)  Housing: Low Risk  (01/18/2024)  Transportation Needs: No Transportation Needs (01/18/2024)  Utilities: Not At Risk (01/18/2024)  Alcohol Screen: Low Risk  (12/06/2023)  Depression (PHQ2-9): Low Risk  (12/06/2023)  Financial Resource Strain: Low Risk  (12/06/2023)  Physical Activity: Inactive (12/06/2023)  Social Connections: Moderately Integrated (01/18/2024)  Stress: No Stress Concern Present (12/06/2023)  Tobacco Use: Low Risk  (01/20/2024)  Health Literacy: Inadequate Health Literacy (12/06/2023)    Readmission Risk Interventions    01/20/2024    3:32 PM  Readmission Risk Prevention Plan  Transportation Screening Complete  PCP or Specialist Appt within 3-5 Days Complete  HRI or Home Care Consult Complete  Social Work Consult for Recovery Care Planning/Counseling Complete  Palliative Care Screening Not Applicable  Medication Review Oceanographer) Complete

## 2024-01-24 NOTE — Plan of Care (Signed)
  Problem: Education: Goal: Ability to describe self-care measures that may prevent or decrease complications (Diabetes Survival Skills Education) will improve Outcome: Progressing Goal: Individualized Educational Video(s) Outcome: Progressing   Problem: Coping: Goal: Ability to adjust to condition or change in health will improve Outcome: Progressing   Problem: Fluid Volume: Goal: Ability to maintain a balanced intake and output will improve Outcome: Progressing   Problem: Health Behavior/Discharge Planning: Goal: Ability to identify and utilize available resources and services will improve Outcome: Progressing Goal: Ability to manage health-related needs will improve Outcome: Progressing   Problem: Metabolic: Goal: Ability to maintain appropriate glucose levels will improve Outcome: Progressing   Problem: Nutritional: Goal: Maintenance of adequate nutrition will improve Outcome: Progressing Goal: Progress toward achieving an optimal weight will improve Outcome: Progressing   Problem: Skin Integrity: Goal: Risk for impaired skin integrity will decrease Outcome: Progressing   Problem: Tissue Perfusion: Goal: Adequacy of tissue perfusion will improve Outcome: Progressing   Problem: Education: Goal: Knowledge of condition and prescribed therapy will improve Outcome: Progressing   Problem: Cardiac: Goal: Will achieve and/or maintain adequate cardiac output Outcome: Progressing   Problem: Physical Regulation: Goal: Complications related to the disease process, condition or treatment will be avoided or minimized Outcome: Progressing   Problem: Education: Goal: Knowledge of General Education information will improve Description: Including pain rating scale, medication(s)/side effects and non-pharmacologic comfort measures Outcome: Progressing   Problem: Health Behavior/Discharge Planning: Goal: Ability to manage health-related needs will improve Outcome:  Progressing   Problem: Clinical Measurements: Goal: Ability to maintain clinical measurements within normal limits will improve Outcome: Progressing Goal: Will remain free from infection Outcome: Progressing Goal: Diagnostic test results will improve Outcome: Progressing Goal: Respiratory complications will improve Outcome: Progressing Goal: Cardiovascular complication will be avoided Outcome: Progressing   Problem: Activity: Goal: Risk for activity intolerance will decrease Outcome: Progressing   Problem: Nutrition: Goal: Adequate nutrition will be maintained Outcome: Progressing   Problem: Coping: Goal: Level of anxiety will decrease Outcome: Progressing   Problem: Elimination: Goal: Will not experience complications related to bowel motility Outcome: Progressing Goal: Will not experience complications related to urinary retention Outcome: Progressing   Problem: Pain Managment: Goal: General experience of comfort will improve and/or be controlled Outcome: Progressing   Problem: Safety: Goal: Ability to remain free from injury will improve Outcome: Progressing   Problem: Skin Integrity: Goal: Risk for impaired skin integrity will decrease Outcome: Progressing

## 2024-01-24 NOTE — Telephone Encounter (Signed)
They should check with [facility] hospitalist doctor.  We do not go to the hospital any longer.  Thank you

## 2024-01-24 NOTE — Progress Notes (Addendum)
Progress Note  Primary GI: Unassigned (previously followed with Dr. Juanda Chance)  DOA: 01/17/2024         Hospital Day: 8   Subjective  Chief Complaint: Symptomatic anemia syncope FOBT positive  Patient with family at bedside, daughter. Provided some of the history.  Patient had 1 BM with wine colored liquid stool yesterday in the afternoon, patient have drift of hemoglobin from 7.9-6.7 received 1 PRBC 1/29 with response to 7.7, after low burgundy liquid stool hemoglobin dropped to 6.8 patient was scheduled for a transfusion yesterday however this was never given, hemoglobin increased to 7.9. She has not had any further bowel movements she denies any abdominal pain, fever or chills. Patient did have a difficult night with some hallucinations and possible delirium  per daughter.    Objective   Vital signs in last 24 hours: Temp:  [98 F (36.7 C)-99 F (37.2 C)] 98 F (36.7 C) (01/31 0416) Pulse Rate:  [53-75] 75 (01/31 0416) Resp:  [14-17] 16 (01/31 0416) BP: (131-156)/(60-79) 156/79 (01/31 0416) SpO2:  [99 %-100 %] 100 % (01/31 0416) Weight:  [47.7 kg] 47.7 kg (01/31 0500) Last BM Date : 01/22/24 Last BM recorded by nurses in past 5 days Stool Type: Type 7 (Liquid consistency with no solid pieces) (01/22/2024 10:00 AM)  General:   female in no acute distress  Heart:  Regular rate and rhythm; no murmurs Pulm: Clear anteriorly; no wheezing Abdomen:  Soft, Non-distended AB, Active bowel sounds. No tenderness . Extremities:  without  edema. Neurologic:  Alert and  oriented x4;  No focal deficits.  Psych:  Cooperative. Normal mood and affect.  Intake/Output from previous day: 01/30 0701 - 01/31 0700 In: 236 [P.O.:236] Out: 650 [Urine:650] Intake/Output this shift: No intake/output data recorded.  Studies/Results: No results found.  Lab Results: Recent Labs    01/22/24 0604 01/23/24 0546 01/23/24 1356 01/23/24 1941 01/24/24 0512 01/24/24 0739  WBC 8.0 7.5  --   --   7.1  --   HGB 6.7* 7.7*   < > 7.0* 7.7* 7.9*  HCT 20.7* 23.1*   < > 21.6* 23.8* 25.2*  PLT 83* 83*  --   --  109*  --    < > = values in this interval not displayed.   BMET Recent Labs    01/22/24 0604 01/23/24 0546 01/24/24 0512  NA 135 140 138  K 4.1 4.1 3.4*  CL 108 113* 111  CO2 19* 20* 23  GLUCOSE 111* 97 113*  BUN 61* 61* 52*  CREATININE 0.83 1.10* 1.06*  CALCIUM 8.3* 8.2* 8.3*   LFT No results for input(s): "PROT", "ALBUMIN", "AST", "ALT", "ALKPHOS", "BILITOT", "BILIDIR", "IBILI" in the last 72 hours. PT/INR No results for input(s): "LABPROT", "INR" in the last 72 hours.   Scheduled Meds:  allopurinol  50 mg Oral Daily   amLODipine  5 mg Oral Daily   ascorbic acid  500 mg Oral Daily   dorzolamide-timolol  1 drop Both Eyes BID   feeding supplement  237 mL Oral BID BM   gabapentin  100 mg Oral BID   insulin aspart  0-6 Units Subcutaneous Q4H   latanoprost  1 drop Both Eyes QHS   sertraline  100 mg Oral Daily   sodium chloride flush  3 mL Intravenous Q12H   Continuous Infusions:    Objective    Colonoscopy 1/27 - Diverticulosis in the entire examined colon with associated about the colon without an active site of bleeding  identified. Most consistent with. - The examination was otherwise normal on direct and retroflexion views, save diminutive cecal polyp which was not removed and prior sigmoid resection resection. - No specimens collected.   EGD 1/26 - Normal esophagus. - Multiple gastric polyps. - Normal mucosa was found in the entire stomach. - Normal examined duodenum. - No areas of active bleeding, stigmata of recent bleeding, or heme noted on this study. - No specimens collected     Patient profile:   Breanna [name] is a 89 y.o. year old female with a history of HTN, diabetes, CKD 3, CVA, gout, CHF, blindness, sigmoid colectomy 2006.  Admitted 1/24 with symptomatic anemia, FOBT+. GI consulted 1/25.    Impression/Plan:   Symptomatic anemia FOBT  positive 01/24/2024  HGB 7.9 MCV 90.2 Platelets 109 01/24/2024 Iron 18 Ferritin 34 B12 1,322 Recent Labs    01/20/24 1522 01/20/24 1701 01/21/24 0605 01/22/24 0604 01/23/24 0546 01/23/24 1356 01/23/24 1757 01/23/24 1941 01/24/24 0512 01/24/24 0739  HGB 8.3* 7.5* 7.9* 6.7* 7.7* 6.8* 7.3* 7.0* 7.7* 7.9*   EGD 1/26 no areas of active bleeding no specimens collected Colonoscopy 1/27 with diverticulosis throughout the entire examined colon without active bleeding no specimens collected Hgb on admission 6.4 increased to 7.9 after 2 PRBCs (total of 3 this admission) Drift of hemoglobin from 7.9-->6.7, status post 1 PRBC on 01/29 with good response 7.7 on 01/30 Patient had another episode of liquid burgundy stool yesterday repeat Hgb was 6 point6.8 patient never received any blood lead and hemoglobin currently back at 7.9 - this still most likely represents  diverticular bleed with previous colonoscopy, EGD and burgundy blood. -Continue to monitor Hgb transfuse to keep greater than 7.5 - normal B12, appropriate reticulocyte response, consider IV iron -  Continue soft diet -Any overt GI bleeding or large-volume consider repeat CTA -Would suggest outpatient monitoring serial CBC and son monitoring stools as patient is blind when patient does get discharge  Chronic thrombocytopenia Stable at 109  Diminutive cecal polyp Not removed secondary GI bleeding  Principal Problem:   Syncope Active Problems:   DM2 (diabetes mellitus, type 2) (HCC)   Essential hypertension   History of cardioembolic cerebrovascular accident (CVA)   Diarrhea   Normocytic anemia   Elevated troponin   Stage 3b chronic kidney disease (CKD) (HCC)   Thrombocytopenia (HCC)   ABLA (acute blood loss anemia)   Heme positive stool   Symptomatic anemia   Diverticulosis of colon with hemorrhage   Acute blood loss anemia   Lower gastrointestinal bleeding    LOS: 4 days   Doree Albee  01/24/2024, 11:54 AM  GI  ATTENDING  Interval history and data reviewed.  Patient seen and examined.  Agree with interval progress note.  Appears to have had stuttering bleeding after colonoscopy.  Seems to be slowing (home.).  Okay to advance diet.  Continue to monitor stools closely.  Again, transfuse for hemoglobin less than 7.5.  Will follow.  Wilhemina Bonito. Eda Keys., M.D. [facility] Division of Gastroenterology

## 2024-01-24 NOTE — Evaluation (Signed)
Physical Therapy Evaluation Patient Details Name: Breanna [name] MRN: [id number] DOB: [birth year] Today's Date: 01/24/2024  History of Present Illness  Pt is 89 yo female admitted on 1/01/28/24 with syncope, anemia, and possible small bowel bleed.  Has required multiple PRBC transfusions.  Had colonscopy and upper endoscopy.  Pt with hx including but not limited to L rotator cuff injury, DM2, HTN, GERD, sigmoid colectomy.  Clinical Impression  Pt admitted with above diagnosis. At baseline, pt has 24 hr supervision and is ambulatory in home with RW.  Today, pt needing min A for transfers and to ambulate 45'.  She did fatigue easily.  She did need increased cues and time for all transfers and movement.  Pt was on RA with all VSS, orthostatic BP were negative, and denies any lightheadedness.  Daughter reports appears slightly weaker/slower but close to baseline and feels comfortable providing level of assist needed, did request HHPT and PT agrees.  Pt currently with functional limitations due to the deficits listed below (see PT Problem List). Pt will benefit from acute skilled PT to increase their independence and safety with mobility to allow discharge.           If plan is discharge home, recommend the following: A little help with walking and/or transfers;A little help with bathing/dressing/bathroom;Assistance with cooking/housework;Help with stairs or ramp for entrance   Can travel by private vehicle        Equipment Recommendations Other (comment) (Transport chair)  Recommendations for Other Services       Functional Status Assessment Patient has had a recent decline in their functional status and demonstrates the ability to make significant improvements in function in a reasonable and predictable amount of time.     Precautions / Restrictions Precautions Precautions: Fall Precaution Comments: Legally Blind Restrictions Weight Bearing Restrictions Per Provider Order: No       Mobility  Bed Mobility Overal bed mobility: Needs Assistance Bed Mobility: Supine to Sit     Supine to sit: Min assist     General bed mobility comments: Increased time and cues; light min A to scoot    Transfers Overall transfer level: Needs assistance Equipment used: Rolling walker (2 wheels) Transfers: Sit to/from Stand Sit to Stand: From elevated surface, Min assist           General transfer comment: Light min A; significantly increased time; cues for hand placement    Ambulation/Gait Ambulation/Gait assistance: Min assist Gait Distance (Feet): 45 Feet Assistive device: Rolling walker (2 wheels) Gait Pattern/deviations: Step-to pattern, Decreased stride length, Shuffle, Trunk flexed Gait velocity: decreased     General Gait Details: Pt tending to get too far from RW.  Frequent cues to correct.  CGA for balance.  Needing cues for direction and occasional min A due to legally blind  Stairs            Wheelchair Mobility     Tilt Bed    Modified Rankin (Stroke Patients Only)       Balance Overall balance assessment: Needs assistance Sitting-balance support: No upper extremity supported Sitting balance-Leahy Scale: Good     Standing balance support: Bilateral upper extremity supported Standing balance-Leahy Scale: Poor Standing balance comment: RW and CGA; without UE support needs min/mod A                             Pertinent Vitals/Pain Pain Assessment Pain Assessment: 0-10 Pain Score: 7  Pain Location:  pelvis Pain Descriptors / Indicators: Sharp Pain Intervention(s): Limited activity within patient's tolerance, Monitored during session    Home Living Family/patient expects to be discharged to:: Private residence Living Arrangements: Alone Available Help at Discharge: Family;Available 24 hours/day (her children trade) Type of Home: House Home Access: Stairs to enter Entrance Stairs-Rails: Can reach both Entrance  Stairs-Number of Steps: 5   Home Layout: One level Home Equipment: Cane - quad;Cane - single point;Shower Counsellor (2 wheels)      Prior Function               Mobility Comments: Walks with RW mainly household distance; no recent falls ADLs Comments: Pt his min A/supervision with ADLs; family assist IADLs     Extremity/Trunk Assessment   Upper Extremity Assessment Upper Extremity Assessment: LUE deficits/detail LUE Deficits / Details: L shoulder elevation painful (reports prior injury)    Lower Extremity Assessment Lower Extremity Assessment: LLE deficits/detail;RLE deficits/detail RLE Deficits / Details: ROM WFL; MMT 4/5 LLE Deficits / Details: ROM WFL; MMT 4/5    Cervical / Trunk Assessment Cervical / Trunk Assessment: Kyphotic  Communication      Cognition Arousal: Alert Behavior During Therapy: WFL for tasks assessed/performed Overall Cognitive Status: Impaired/Different from baseline                                 General Comments: Some mild increased in confusion compared to baseline.  Daughter present.  Pt is able to follow commands and good safety awareness (asking for RW)        General Comments General comments (skin integrity, edema, etc.): Pt was on RA with sats 100%; HR 60's throughout; BP stable with transfers    Exercises     Assessment/Plan    PT Assessment Patient needs continued PT services  PT Problem List Decreased strength;Decreased range of motion;Decreased activity tolerance;Decreased balance;Decreased mobility;Decreased knowledge of use of DME;Cardiopulmonary status limiting activity;Decreased cognition;Decreased safety awareness       PT Treatment Interventions DME instruction;Therapeutic exercise;Gait training;Balance training;Stair training;Functional mobility training;Therapeutic activities;Patient/family education;Wheelchair mobility training    PT Goals (Current goals can be found in the Care Plan  section)  Acute Rehab PT Goals Patient Stated Goal: return home with HHPT PT Goal Formulation: With patient/family Time For Goal Achievement: 02/07/24 Potential to Achieve Goals: Good    Frequency Min 1X/week     Co-evaluation               AM-PAC PT "6 Clicks" Mobility  Outcome Measure Help needed turning from your back to your side while in a flat bed without using bedrails?: A Little Help needed moving from lying on your back to sitting on the side of a flat bed without using bedrails?: A Little Help needed moving to and from a bed to a chair (including a wheelchair)?: A Little Help needed standing up from a chair using your arms (e.g., wheelchair or bedside chair)?: A Little Help needed to walk in hospital room?: A Little Help needed climbing 3-5 steps with a railing? : A Lot 6 Click Score: 17    End of Session Equipment Utilized During Treatment: Gait belt Activity Tolerance: Patient tolerated treatment well Patient left: with chair alarm set;in chair;with call bell/phone within reach;with family/visitor present (pt has soft touch alarm so chair alarm not into call light; dtr reports will be with her all day) Nurse Communication: Mobility status PT Visit Diagnosis: Other abnormalities of  gait and mobility (R26.89);Muscle weakness (generalized) (M62.81)    Time: 4098-1191 PT Time Calculation (min) (ACUTE ONLY): 41 min   Charges:   PT Evaluation $PT Eval Low Complexity: 1 Low PT Treatments $Gait Training: 8-22 mins $Therapeutic Activity: 8-22 mins PT General Charges $$ ACUTE PT VISIT: 1 Visit         Anise Salvo, PT Acute Rehab Services [facility] [phone]   Rayetta Humphrey 01/24/2024, 12:12 PM

## 2024-01-24 NOTE — Progress Notes (Signed)
TRH ROUNDING   NOTE Breanna [name] ZOX:[id number]  DOB: Jan 19, 1931  DOA: 01/17/2024  PCP: Tresa Garter, MD  01/24/2024,12:19 PM   LOS: 4 days      Code Status: Full code From: Home  current Dispo: Unclear   89 year old female Previous history left shoulder rotator cuff injury followed with Dr. 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 Last EGD 04/28/2010 = presbyesophagus dilated with 54 [place] Maloney multiple gastric polyps, gout stage IIIb CKD-at baseline is blind 9 mm hyperdense left superior pole kidney lesion  Events 01/17/2024 admission several days malaise chills lightheadedness on standing brief LOC with assisted fall--- found to have gross blood on DRE aspirin was held transfused 1 unit GI consulted Sodium 141 potassium 3.9 BUN/creatinine 61/1.1 (usually creatinine less than 1.3) Hemoglobin baseline October 2024 10-7.3 on arrival, platelet 105, WBC 8.4, respiratory viral panel    UA negative  procedures 1/25 transfused 1/26 upper endoscopy normal esophagus multiple polyps normal mucosa no areas of active bleeding 1/27 colonoscopy diverticulosis entire examined colon-5 mm cecal polyp not removed 1/29 transfused 1 U prbc again    Plan  Possible small bowel bleed Colonoscopy performedUpper Endo--no overt source-deferring to GI further work-up Could be secondary to polyp not removed but less liekyl-aspirin has been held If frank melena--stat CTA and may need embolization If demonstrates further stability likely home in am with relatively close follow-up Anemia of acute blood loss Transfused several times this hospital stay Iron 18/tsat 6--will give IV iron x 2 and recheck studies in OP setting Syncope on admission secondary to anemia  Therapy feels that she can d/c home with a little help Blood pressures are stable- HTN HFpEF EF 1/25--ef 60-65% grade 2  DD Continue amlodipine 5,  losartan 50 has been held Aldactone Monday Wednesday Friday held Would not resume in the outpatient setting Hypernatremia azotemia Overall seems improved Azotemia rseems a little bette-IVF stopped Diabetes mellitus type 2 A1c has always been below seven 6.8 last check Continues on sliding scale-CBG 80-120 Metformin has been held from prior to admission and may not resume May continue gabapentin 100 twice daily for neuropathy Prior CVA Aspirin on hold At baseline blind--compensates well  DVT prophylaxis: Contraindicated  Status is: Inpatient Remains inpatient appropriate because:   Still bleeding not ready for discharge  Subjective:  Some confusion overnight and this am--she is able to tell me place and year however No reports further dark stool Daughter at bedside  Objective + exam Vitals:   01/23/24 1245 01/23/24 2104 01/24/24 0416 01/24/24 0500  BP: 131/60 (!) 144/68 (!) 156/79   Pulse: (!) 53 (!) 59 75   Resp: 17 14 16    Temp: 98.4 F (36.9 C) 99 F (37.2 C) 98 F (36.7 C)   TempSrc: Oral Oral    SpO2: 99% 100% 100%   Weight:    47.7 kg  Height:       Filed Weights   01/22/24 0500 01/23/24 0500 01/24/24 0500  Weight: 49.6 kg 49.8 kg 47.7 kg    Examination:  EOMI NCAT no focal deficit no icterus no pallor Blind in both eyes Chest is clear  Seems to be in NSR ROM intact Power 5/5 grossly Psych euthymic   Data Reviewed: reviewed   CBC    Component Value Date/Time   WBC 7.1 01/24/2024 0512   RBC 2.64 (L) 01/24/2024 0512   RBC 2.64 (L) 01/24/2024  0512   HGB 7.9 (L) 01/24/2024 0739   HCT 25.2 (L) 01/24/2024 0739   PLT 109 (L) 01/24/2024 0512   MCV 90.2 01/24/2024 0512   MCH 29.2 01/24/2024 0512   MCHC 32.4 01/24/2024 0512   RDW 16.7 (H) 01/24/2024 0512   LYMPHSABS 1.0 01/24/2024 0512   MONOABS 0.7 01/24/2024 0512   EOSABS 0.2 01/24/2024 0512   BASOSABS 0.0 01/24/2024 0512      Latest Ref Rng & Units 01/24/2024     5:12 AM 01/23/2024    5:46 AM 01/22/2024    6:04 AM  CMP  Glucose 70 - 99 mg/dL 756  97  433   BUN 8 - 23 mg/dL 52  61  61   Creatinine 0.44 - 1.00 mg/dL 2.95  1.88  4.16   Sodium 135 - 145 mmol/L 138  140  135   Potassium 3.5 - 5.1 mmol/L 3.4  4.1  4.1   Chloride 98 - 111 mmol/L 111  113  108   CO2 22 - 32 mmol/L 23  20  19    Calcium 8.9 - 10.3 mg/dL 8.3  8.2  8.3     Scheduled Meds:  allopurinol  50 mg Oral Daily   amLODipine  5 mg Oral Daily   ascorbic acid  500 mg Oral Daily   dorzolamide-timolol  1 drop Both Eyes BID   feeding supplement  237 mL Oral BID BM   gabapentin  100 mg Oral BID   insulin aspart  0-6 Units Subcutaneous Q4H   latanoprost  1 drop Both Eyes QHS   sertraline  100 mg Oral Daily   sodium chloride flush  3 mL Intravenous Q12H   Continuous Infusions:  Time  24  Rhetta Mura, MD  [facility]

## 2024-01-25 DIAGNOSIS — K5731 Diverticulosis of large intestine without perforation or abscess with bleeding: Secondary | ICD-10-CM | POA: Diagnosis not present

## 2024-01-25 DIAGNOSIS — D62 Acute posthemorrhagic anemia: Secondary | ICD-10-CM | POA: Diagnosis not present

## 2024-01-25 DIAGNOSIS — R55 Syncope and collapse: Secondary | ICD-10-CM | POA: Diagnosis not present

## 2024-01-25 LAB — CBC WITH DIFFERENTIAL/PLATELET
Abs Immature Granulocytes: 0.13 10*3/uL — ABNORMAL HIGH (ref 0.00–0.07)
Basophils Absolute: 0 10*3/uL (ref 0.0–0.1)
Basophils Relative: 0 %
Eosinophils Absolute: 0.2 10*3/uL (ref 0.0–0.5)
Eosinophils Relative: 2 %
HCT: 22.9 % — ABNORMAL LOW (ref 36.0–46.0)
Hemoglobin: 7.2 g/dL — ABNORMAL LOW (ref 12.0–15.0)
Immature Granulocytes: 2 %
Lymphocytes Relative: 15 %
Lymphs Abs: 1.1 10*3/uL (ref 0.7–4.0)
MCH: 28.9 pg (ref 26.0–34.0)
MCHC: 31.4 g/dL (ref 30.0–36.0)
MCV: 92 fL (ref 80.0–100.0)
Monocytes Absolute: 0.7 10*3/uL (ref 0.1–1.0)
Monocytes Relative: 9 %
Neutro Abs: 5.3 10*3/uL (ref 1.7–7.7)
Neutrophils Relative %: 72 %
Platelets: 127 10*3/uL — ABNORMAL LOW (ref 150–400)
RBC: 2.49 MIL/uL — ABNORMAL LOW (ref 3.87–5.11)
RDW: 16.9 % — ABNORMAL HIGH (ref 11.5–15.5)
WBC: 7.4 10*3/uL (ref 4.0–10.5)
nRBC: 0.3 % — ABNORMAL HIGH (ref 0.0–0.2)

## 2024-01-25 LAB — BASIC METABOLIC PANEL
Anion gap: 6 (ref 5–15)
BUN: 44 mg/dL — ABNORMAL HIGH (ref 8–23)
CO2: 23 mmol/L (ref 22–32)
Calcium: 8.5 mg/dL — ABNORMAL LOW (ref 8.9–10.3)
Chloride: 113 mmol/L — ABNORMAL HIGH (ref 98–111)
Creatinine, Ser: 0.97 mg/dL (ref 0.44–1.00)
GFR, Estimated: 56 mL/min — ABNORMAL LOW (ref >60)
Glucose, Bld: 98 mg/dL (ref 70–99)
Potassium: 3.8 mmol/L (ref 3.5–5.1)
Sodium: 142 mmol/L (ref 135–145)

## 2024-01-25 LAB — GLUCOSE, CAPILLARY
Glucose-Capillary: 100 mg/dL — ABNORMAL HIGH (ref 70–99)
Glucose-Capillary: 131 mg/dL — ABNORMAL HIGH (ref 70–99)
Glucose-Capillary: 168 mg/dL — ABNORMAL HIGH (ref 70–99)
Glucose-Capillary: 170 mg/dL — ABNORMAL HIGH (ref 70–99)
Glucose-Capillary: 76 mg/dL (ref 70–99)
Glucose-Capillary: 80 mg/dL (ref 70–99)

## 2024-01-25 LAB — PREPARE RBC (CROSSMATCH)

## 2024-01-25 MED ORDER — DIPHENHYDRAMINE HCL 25 MG PO CAPS: 25.0000 mg | ORAL_CAPSULE | Freq: Once | ORAL | Status: DC

## 2024-01-25 MED ORDER — ACETAMINOPHEN 325 MG PO TABS: 650.0000 mg | ORAL_TABLET | Freq: Once | ORAL | Status: DC

## 2024-01-25 MED ORDER — SODIUM CHLORIDE 0.9% IV SOLUTION: Freq: Once | INTRAVENOUS | Status: AC

## 2024-01-25 MED ORDER — FUROSEMIDE 10 MG/ML IJ SOLN: 20.0000 mg | Freq: Once | INTRAMUSCULAR | Status: DC

## 2024-01-25 NOTE — Plan of Care (Signed)
  Problem: Coping: Goal: Ability to adjust to condition or change in health will improve Outcome: Progressing   Problem: Fluid Volume: Goal: Ability to maintain a balanced intake and output will improve Outcome: Progressing   Problem: Health Behavior/Discharge Planning: Goal: Ability to identify and utilize available resources and services will improve Outcome: Progressing   

## 2024-01-25 NOTE — Progress Notes (Signed)
HISTORY OF PRESENT ILLNESS:  Breanna [name] is a 89 y.o. female who admitted with significant GI bleeding.  Felt to be diverticular placed on colonoscopy (pandiverticulosis and blood in the colon).  No bowel movement since yesterday.  Blood counts have drifted a bit from 7.9-7.2.  Now receiving blood.  Tolerating diet.  Complaining about multiple blood sticks, but otherwise remains pleasant.  Multiple family members in room.  REVIEW OF SYSTEMS:  All non-GI ROS negative except for  Past Medical History:  Diagnosis Date   Adrenal adenoma 12/24/2004   Boils 12/25/2007   Cholelithiasis    asympt. w/normal HIDA 03/2010   CVA (cerebral infarction) 12/24/2008   Cerebellar   Diverticulitis    Esophageal spasm 12/24/2009   GERD (gastroesophageal reflux disease)    Gout    History of colon polyps    HTN (hypertension)    Hydronephrosis    LEFT/ Surgical intervention   Hyperlipidemia    LBP (low back pain)    Osteoarthritis    Pulmonary HTN (HCC)    Stress    Type II or unspecified type diabetes mellitus without mention of complication, not stated as uncontrolled     Past Surgical History:  Procedure Laterality Date   ABDOMINAL HYSTERECTOMY     APPENDECTOMY     2020   BACK SURGERY     BREAST BIOPSY     RIGHT   CATARACT EXTRACTION, BILATERAL     COLECTOMY  2006   Sigmoid   COLONOSCOPY Left 01/20/2024   Procedure: COLONOSCOPY;  Surgeon: Hilarie Fredrickson, MD;  Location: [facility];  Service: Gastroenterology;  Laterality: Left;   ESOPHAGOGASTRODUODENOSCOPY Left 01/19/2024   Procedure: ESOPHAGOGASTRODUODENOSCOPY (EGD);  Surgeon: Shellia Cleverly, DO;  Location: [facility];  Service: Gastroenterology;  Laterality: Left;   FOOT SURGERY     BILATERAL   HAMMER TOE SURGERY     INTRAMEDULLARY (IM) NAIL INTERTROCHANTERIC Right 07/21/2020   Procedure: INTRAMEDULLARY (IM) NAIL INTERTROCHANTRIC;  Surgeon: Sheral Apley, MD;  Location: [facility] OR;  Service: Orthopedics;  Laterality: Right;    ROTATOR CUFF REPAIR  2008   RIGHT   TOTAL KNEE ARTHROPLASTY  2003   LEFT   VARICOSE VEIN SURGERY     vein stripping/lower extremities    Social History Maybelle Depaoli  reports that she has never smoked. She has never used smokeless tobacco. She reports that she does not drink alcohol and does not use drugs.  family history includes Breast cancer in her daughter; Cancer in her daughter, father, and maternal uncle; Diabetes in her mother and another family member; Heart disease in her mother and another family member; Hypertension in her father and another family member; Prostate cancer in her father and maternal uncle.  Allergies  Allergen Reactions   Verapamil Shortness Of Breath    REACTION: SOB   Aspirin Itching    But tolerates low dose   Atenolol     REACTION: fatigue   Codeine Itching   Codeine Sulfate    Hydrochlorothiazide     REACTION: gout   Hydrocodone       side effects - hallucinations   Ibuprofen     Upset stomach w/high doses   Lisinopril     REACTION: cough   Onion Other (See Comments)    Dry mouth/ gets sores   Shellfish Allergy Swelling    Patient stated she does not eat shellfish, she swells up on different parts of the body   Statins  Dizzy, falls   Valsartan Itching   Adhesive  [Tape] Rash   Other Rash       PHYSICAL EXAMINATION: Vital signs: BP 119/73 (BP Location: Right Arm)   Pulse (!) 52   Temp 98.1 F (36.7 C)   Resp 18   Ht 5\' 2"  (1.575 m)   Wt 48 kg   SpO2 100%   BMI 19.35 kg/m  General: Well-developed, well-nourished, no acute distress HEENT: Sclerae are anicteric, conjunctiva pink. Oral mucosa intact Lungs: Clear Heart: Regular Abdomen: soft, nontender, nondistended, no obvious ascites, no peritoneal signs, normal bowel sounds. No organomegaly. Extremities: No edema Psychiatric: alert and oriented x3. Cooperative     ASSESSMENT:  1.  Diverticular bleeding. 2.  Acute blood loss anemia 3.  Advanced age and  comorbidities   PLAN:  1.  Agree with transfusion 2.  Continue to monitor blood counts and stools 3.  Diet of choice  Abir Eroh N. Eda Keys., M.D. [facility] Division of Gastroenterology

## 2024-01-25 NOTE — Progress Notes (Signed)
TRH ROUNDING   NOTE Breanna [name] [id number]:[id number]  DOB: 12/11/1931  DOA: 01/17/2024  PCP: Tresa Garter, MD  01/25/2024,12:43 PM   LOS: 5 days      Code Status: Full code From: Home  current Dispo: Unclear   89 year old female Previous history left shoulder rotator cuff injury followed with Dr. 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 Last EGD 04/28/2010 = presbyesophagus dilated with 54 French Maloney multiple gastric polyps, gout stage IIIb CKD-at baseline is blind 9 mm hyperdense left superior pole kidney lesion  Events 01/17/2024 admission several days malaise chills lightheadedness on standing brief LOC with assisted fall--- found to have gross blood on DRE aspirin was held transfused 1 unit GI consulted Sodium 141 potassium 3.9 BUN/creatinine 61/1.1 (usually creatinine less than 1.3) Hemoglobin baseline October 2024 10-7.3 on arrival, platelet 105, WBC 8.4, respiratory viral panel    UA negative  procedures 1/25 transfused 1/26 upper endoscopy normal esophagus multiple polyps normal mucosa no areas of active bleeding 1/27 colonoscopy diverticulosis entire examined colon-5 mm cecal polyp not removed 1/29 transfused 1 U prbc again 2/1 transfuse 1 unit PRBC    Plan  Possible small bowel bleed Colonoscopy performedUpper Endo--no overt source-deferring to GI further work-up If frank melena--stat CTA and may need embolization Await hemoglobin consistently above 7.5 and hopeful for discharge Anemia of acute blood loss Transfusing again this morning Iron 18/tsat 6--will give IV iron x 2 and recheck studies in OP setting Syncope on admission secondary to anemia  Therapy feels that she can d/c home with a little help Blood pressures are stable- HTN HFpEF EF 1/25--ef 60-65% grade 2 DD Continue amlodipine 5,  losartan 50 has been held Aldactone Monday Wednesday Friday  held Would not resume in the outpatient setting given advanced age etc. Hypernatremia azotemia Overall seems improved IVF stopped Diabetes mellitus type 2 A1c has always been below seven 6.8 last check Continues on sliding scale-CBG 80-170 Metformin has been held from prior to admission and may not resume given advanced age and risk of hypoglycemia May continue gabapentin 100 twice daily for neuropathy Prior CVA Aspirin on hold At baseline blind--compensates well  DVT prophylaxis: Contraindicated  Status is: Inpatient Remains inpatient appropriate because:   Still bleeding not ready for discharge  Subjective:  Less confused Multiple family is at bedside We initially were planning on discharging her home but with reports of dark stool from nursing as well as hemoglobin of 7.2 we will give her 1 more unit and keep another day   Objective + exam Vitals:   01/25/24 1037 01/25/24 1045 01/25/24 1100 01/25/24 1143  BP: (!) 144/68  135/63 119/73  Pulse: (!) 58  (!) 56 (!) 52  Resp: 15  16 18   Temp: 98.6 F (37 C)  98.5 F (36.9 C) 98.1 F (36.7 C)  TempSrc: Oral Oral Oral   SpO2: 100%  100% 100%  Weight:      Height:       Filed Weights   01/23/24 0500 01/24/24 0500 01/25/24 0500  Weight: 49.8 kg 47.7 kg 48 kg    Examination:   no distress she is blind in both eyes  S1-S2 no murmur Chest clear Abdomen soft ROM intact follows commands Coherent today   Data Reviewed: reviewed   CBC    Component Value Date/Time   WBC 7.4 01/25/2024 0659   RBC 2.49 (L) 01/25/2024 6644  HGB 7.2 (L) 01/25/2024 0659   HCT 22.9 (L) 01/25/2024 0659   PLT 127 (L) 01/25/2024 0659   MCV 92.0 01/25/2024 0659   MCH 28.9 01/25/2024 0659   MCHC 31.4 01/25/2024 0659   RDW 16.9 (H) 01/25/2024 0659   LYMPHSABS 1.1 01/25/2024 0659   MONOABS 0.7 01/25/2024 0659   EOSABS 0.2 01/25/2024 0659   BASOSABS 0.0 01/25/2024 0659      Latest Ref Rng & Units 01/25/2024    6:59 AM 01/24/2024     5:12 AM 01/23/2024    5:46 AM  CMP  Glucose 70 - 99 mg/dL 98  469  97   BUN 8 - 23 mg/dL 44  52  61   Creatinine 0.44 - 1.00 mg/dL 6.29  5.28  4.13   Sodium 135 - 145 mmol/L 142  138  140   Potassium 3.5 - 5.1 mmol/L 3.8  3.4  4.1   Chloride 98 - 111 mmol/L 113  111  113   CO2 22 - 32 mmol/L 23  23  20    Calcium 8.9 - 10.3 mg/dL 8.5  8.3  8.2     Scheduled Meds:  acetaminophen  650 mg Oral Once   allopurinol  50 mg Oral Daily   amLODipine  5 mg Oral Daily   ascorbic acid  500 mg Oral Daily   diphenhydrAMINE  25 mg Oral Once   dorzolamide-timolol  1 drop Both Eyes BID   feeding supplement  237 mL Oral BID BM   furosemide  20 mg Intravenous Once   gabapentin  100 mg Oral BID   insulin aspart  0-6 Units Subcutaneous Q4H   latanoprost  1 drop Both Eyes QHS   sertraline  100 mg Oral Daily   sodium chloride flush  3 mL Intravenous Q12H   Continuous Infusions:  iron sucrose 100 mg (01/24/24 1401)   Time  24  Rhetta Mura, MD  [facility]

## 2024-01-25 NOTE — Plan of Care (Signed)
  Problem: Education: Goal: Ability to describe self-care measures that may prevent or decrease complications (Diabetes Survival Skills Education) will improve Outcome: Progressing Goal: Individualized Educational Video(s) Outcome: Progressing   Problem: Coping: Goal: Ability to adjust to condition or change in health will improve Outcome: Progressing   Problem: Fluid Volume: Goal: Ability to maintain a balanced intake and output will improve Outcome: Progressing   Problem: Health Behavior/Discharge Planning: Goal: Ability to identify and utilize available resources and services will improve Outcome: Progressing Goal: Ability to manage health-related needs will improve Outcome: Progressing   Problem: Metabolic: Goal: Ability to maintain appropriate glucose levels will improve Outcome: Progressing   Problem: Nutritional: Goal: Maintenance of adequate nutrition will improve Outcome: Progressing Goal: Progress toward achieving an optimal weight will improve Outcome: Progressing   Problem: Skin Integrity: Goal: Risk for impaired skin integrity will decrease Outcome: Progressing   Problem: Tissue Perfusion: Goal: Adequacy of tissue perfusion will improve Outcome: Progressing   Problem: Education: Goal: Knowledge of condition and prescribed therapy will improve Outcome: Progressing   Problem: Cardiac: Goal: Will achieve and/or maintain adequate cardiac output Outcome: Progressing   Problem: Physical Regulation: Goal: Complications related to the disease process, condition or treatment will be avoided or minimized Outcome: Progressing   Problem: Education: Goal: Knowledge of General Education information will improve Description: Including pain rating scale, medication(s)/side effects and non-pharmacologic comfort measures Outcome: Progressing   Problem: Health Behavior/Discharge Planning: Goal: Ability to manage health-related needs will improve Outcome:  Progressing   Problem: Clinical Measurements: Goal: Ability to maintain clinical measurements within normal limits will improve Outcome: Progressing Goal: Will remain free from infection Outcome: Progressing Goal: Diagnostic test results will improve Outcome: Progressing Goal: Respiratory complications will improve Outcome: Progressing Goal: Cardiovascular complication will be avoided Outcome: Progressing   Problem: Activity: Goal: Risk for activity intolerance will decrease Outcome: Progressing   Problem: Nutrition: Goal: Adequate nutrition will be maintained Outcome: Progressing   Problem: Coping: Goal: Level of anxiety will decrease Outcome: Progressing   Problem: Elimination: Goal: Will not experience complications related to bowel motility Outcome: Progressing Goal: Will not experience complications related to urinary retention Outcome: Progressing   Problem: Pain Managment: Goal: General experience of comfort will improve and/or be controlled Outcome: Progressing   Problem: Safety: Goal: Ability to remain free from injury will improve Outcome: Progressing   Problem: Skin Integrity: Goal: Risk for impaired skin integrity will decrease Outcome: Progressing

## 2024-01-25 NOTE — Progress Notes (Signed)
Mobility Specialist - Progress Note   01/25/24 0900  Mobility  Activity Ambulated with assistance in hallway  Level of Assistance Minimal assist, patient does 75% or more  Assistive Device Front wheel walker  Distance Ambulated (ft) 120 ft  Range of Motion/Exercises Active  Activity Response Tolerated well  Mobility Referral Yes  Mobility visit 1 Mobility  Mobility Specialist Start Time (ACUTE ONLY) 0830  Mobility Specialist Stop Time (ACUTE ONLY) 0900  Mobility Specialist Time Calculation (min) (ACUTE ONLY) 30 min   Received in bed and agreed to mobility. Needing verbal cues for ambulation as pt is blind. No issues throughout session and returned to chair with all needs met.   Marilynne Halsted Mobility Specialist

## 2024-01-26 DIAGNOSIS — R55 Syncope and collapse: Secondary | ICD-10-CM | POA: Diagnosis not present

## 2024-01-26 DIAGNOSIS — D62 Acute posthemorrhagic anemia: Secondary | ICD-10-CM | POA: Diagnosis not present

## 2024-01-26 DIAGNOSIS — K5731 Diverticulosis of large intestine without perforation or abscess with bleeding: Secondary | ICD-10-CM | POA: Diagnosis not present

## 2024-01-26 LAB — BASIC METABOLIC PANEL
Anion gap: 5 (ref 5–15)
BUN: 39 mg/dL — ABNORMAL HIGH (ref 8–23)
CO2: 23 mmol/L (ref 22–32)
Calcium: 8.5 mg/dL — ABNORMAL LOW (ref 8.9–10.3)
Chloride: 114 mmol/L — ABNORMAL HIGH (ref 98–111)
Creatinine, Ser: 0.91 mg/dL (ref 0.44–1.00)
GFR, Estimated: 60 mL/min — ABNORMAL LOW (ref >60)
Glucose, Bld: 94 mg/dL (ref 70–99)
Potassium: 4 mmol/L (ref 3.5–5.1)
Sodium: 142 mmol/L (ref 135–145)

## 2024-01-26 LAB — GLUCOSE, CAPILLARY
Glucose-Capillary: 110 mg/dL — ABNORMAL HIGH (ref 70–99)
Glucose-Capillary: 112 mg/dL — ABNORMAL HIGH (ref 70–99)
Glucose-Capillary: 142 mg/dL — ABNORMAL HIGH (ref 70–99)
Glucose-Capillary: 156 mg/dL — ABNORMAL HIGH (ref 70–99)
Glucose-Capillary: 75 mg/dL (ref 70–99)
Glucose-Capillary: 87 mg/dL (ref 70–99)

## 2024-01-26 LAB — CBC WITH DIFFERENTIAL/PLATELET
Abs Immature Granulocytes: 0.11 10*3/uL — ABNORMAL HIGH (ref 0.00–0.07)
Basophils Absolute: 0 10*3/uL (ref 0.0–0.1)
Basophils Relative: 0 %
Eosinophils Absolute: 0.2 10*3/uL (ref 0.0–0.5)
Eosinophils Relative: 3 %
HCT: 25.5 % — ABNORMAL LOW (ref 36.0–46.0)
Hemoglobin: 8.2 g/dL — ABNORMAL LOW (ref 12.0–15.0)
Immature Granulocytes: 2 %
Lymphocytes Relative: 15 %
Lymphs Abs: 1.1 10*3/uL (ref 0.7–4.0)
MCH: 29.3 pg (ref 26.0–34.0)
MCHC: 32.2 g/dL (ref 30.0–36.0)
MCV: 91.1 fL (ref 80.0–100.0)
Monocytes Absolute: 0.7 10*3/uL (ref 0.1–1.0)
Monocytes Relative: 10 %
Neutro Abs: 5.2 10*3/uL (ref 1.7–7.7)
Neutrophils Relative %: 70 %
Platelets: 139 10*3/uL — ABNORMAL LOW (ref 150–400)
RBC: 2.8 MIL/uL — ABNORMAL LOW (ref 3.87–5.11)
RDW: 16.9 % — ABNORMAL HIGH (ref 11.5–15.5)
WBC: 7.3 10*3/uL (ref 4.0–10.5)
nRBC: 0.5 % — ABNORMAL HIGH (ref 0.0–0.2)

## 2024-01-26 NOTE — Progress Notes (Signed)
TRH ROUNDING   NOTE Breanna [name] ZOX:[id number]  DOB: November 18, 1931  DOA: 01/17/2024  PCP: Tresa Garter, MD  01/26/2024,2:32 PM   LOS: 6 days      Code Status: Full code From: Home  current Dispo: Unclear   89 year old female Previous history left shoulder rotator cuff injury followed with Dr. 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 Last EGD 04/28/2010 = presbyesophagus dilated with 54 French Maloney multiple gastric polyps, gout stage IIIb CKD-at baseline is blind 9 mm hyperdense left superior pole kidney lesion  Events 01/17/2024 admission several days malaise chills lightheadedness on standing brief LOC with assisted fall--- found to have gross blood on DRE aspirin was held transfused 1 unit GI consulted Sodium 141 potassium 3.9 BUN/creatinine 61/1.1 (usually creatinine less than 1.3) Hemoglobin baseline October 2024 10-7.3 on arrival, platelet 105, WBC 8.4, respiratory viral panel    UA negative  procedures 1/25 transfused 1/26 upper endoscopy normal esophagus multiple polyps normal mucosa no areas of active bleeding 1/27 colonoscopy diverticulosis entire examined colon-5 mm cecal polyp not removed 1/29 transfused 1 U prbc again 2/1 transfuse 1 unit PRBC    Plan  Possible small bowel bleed Colonoscopy performedUpper Endo--no overt source-deferring to GI further work-up If frank melena--stat CTA and may need embolization If hemoglobin above 7.5 and no significant dark stool-aiming for d/c in am Anemia of acute blood loss Transfused last 2/1 Iron 18/tsat 6--rec'd IV iron x 2  Syncope on admission secondary to anemia  Therapy feels that she can d/c home with a little help Blood pressures ok--not orthostatic HTN HFpEF EF 1/25--ef 60-65% grade 2 DD Continue amlodipine 5 only losartan 50 has been held Aldactone Monday Wednesday Friday held--Would not resume in the  outpatient setting given advanced age etc. Hypernatremia azotemia Overall seems improved IVF stopped Diabetes mellitus type 2 A1c has always been below seven 6.8 last check Continues on sliding scale-CBG 70-150 Metformin has been held from prior to admission and may not resume given advanced age and risk of hypoglycemia May continue gabapentin 100 twice daily for neuropathy Prior CVA Aspirin on hold At baseline blind--compensates well  DVT prophylaxis: Contraindicated  Status is: Inpatient Remains inpatient appropriate because:   not ready for discharge  Subjective:  No stool today Seen eating and drinking No co    Objective + exam Vitals:   01/25/24 1400 01/25/24 2019 01/26/24 0402 01/26/24 1155  BP: 121/84 126/63 (!) 148/61 (!) 141/63  Pulse: 60 (!) 56 (!) 57 (!) 49  Resp: 16 16 16 18   Temp: 98.4 F (36.9 C) 98.3 F (36.8 C) 98.1 F (36.7 C) 98.1 F (36.7 C)  TempSrc: Oral     SpO2: 100% 100% 98% 100%  Weight:      Height:       Filed Weights   01/23/24 0500 01/24/24 0500 01/25/24 0500  Weight: 49.8 kg 47.7 kg 48 kg    Examination:   no distress she is blind in both eyes S1-S2 no murmur Chest clear Abdomen soft ROM intact power 5/5 No LE edema  Data Reviewed: reviewed   CBC    Component Value Date/Time   WBC 7.3 01/26/2024 0538   RBC 2.80 (L) 01/26/2024 0538   HGB 8.2 (L) 01/26/2024 0538   HCT 25.5 (L) 01/26/2024 0538   PLT 139 (L) 01/26/2024 0538   MCV 91.1 01/26/2024 0538   MCH 29.3 01/26/2024 0538  MCHC 32.2 01/26/2024 0538   RDW 16.9 (H) 01/26/2024 0538   LYMPHSABS 1.1 01/26/2024 0538   MONOABS 0.7 01/26/2024 0538   EOSABS 0.2 01/26/2024 0538   BASOSABS 0.0 01/26/2024 0538      Latest Ref Rng & Units 01/26/2024    5:38 AM 01/25/2024    6:59 AM 01/24/2024    5:12 AM  CMP  Glucose 70 - 99 mg/dL 94  98  409   BUN 8 - 23 mg/dL 39  44  52   Creatinine 0.44 - 1.00 mg/dL 8.11  9.14  7.82   Sodium 135 - 145 mmol/L 142  142  138    Potassium 3.5 - 5.1 mmol/L 4.0  3.8  3.4   Chloride 98 - 111 mmol/L 114  113  111   CO2 22 - 32 mmol/L 23  23  23    Calcium 8.9 - 10.3 mg/dL 8.5  8.5  8.3     Scheduled Meds:  acetaminophen  650 mg Oral Once   allopurinol  50 mg Oral Daily   amLODipine  5 mg Oral Daily   ascorbic acid  500 mg Oral Daily   diphenhydrAMINE  25 mg Oral Once   dorzolamide-timolol  1 drop Both Eyes BID   feeding supplement  237 mL Oral BID BM   furosemide  20 mg Intravenous Once   gabapentin  100 mg Oral BID   insulin aspart  0-6 Units Subcutaneous Q4H   latanoprost  1 drop Both Eyes QHS   sertraline  100 mg Oral Daily   sodium chloride flush  3 mL Intravenous Q12H   Continuous Infusions:   Time  14  Rhetta Mura, MD  [facility]

## 2024-01-26 NOTE — Plan of Care (Signed)
  Problem: Nutritional: Goal: Maintenance of adequate nutrition will improve Outcome: Progressing   Problem: Skin Integrity: Goal: Risk for impaired skin integrity will decrease Outcome: Progressing   Problem: Safety: Goal: Ability to remain free from injury will improve Outcome: Progressing   Problem: Pain Managment: Goal: General experience of comfort will improve and/or be controlled Outcome: Progressing

## 2024-01-26 NOTE — Progress Notes (Signed)
Mobility Specialist - Progress Note   01/26/24 1028  Mobility  Activity Ambulated with assistance in hallway  Level of Assistance Minimal assist, patient does 75% or more  Assistive Device Front wheel walker  Distance Ambulated (ft) 180 ft  Range of Motion/Exercises Active  Activity Response Tolerated well  Mobility Referral Yes  Mobility visit 1 Mobility  Mobility Specialist Start Time (ACUTE ONLY) 1005  Mobility Specialist Stop Time (ACUTE ONLY) 1028  Mobility Specialist Time Calculation (min) (ACUTE ONLY) 23 min   Pt was found in bed and agreeable to ambulate. Was min-A for STS and required directional cues. At EOS returned to recliner chair with all needs met. NT and son in room. Chair alarm on.  Billey Chang Mobility Specialist

## 2024-01-26 NOTE — Plan of Care (Signed)
  Problem: Education: Goal: Ability to describe self-care measures that may prevent or decrease complications (Diabetes Survival Skills Education) will improve Outcome: Progressing Goal: Individualized Educational Video(s) Outcome: Progressing   Problem: Coping: Goal: Ability to adjust to condition or change in health will improve Outcome: Progressing   Problem: Fluid Volume: Goal: Ability to maintain a balanced intake and output will improve Outcome: Progressing   Problem: Health Behavior/Discharge Planning: Goal: Ability to identify and utilize available resources and services will improve Outcome: Progressing Goal: Ability to manage health-related needs will improve Outcome: Progressing   Problem: Metabolic: Goal: Ability to maintain appropriate glucose levels will improve Outcome: Progressing   Problem: Nutritional: Goal: Maintenance of adequate nutrition will improve Outcome: Progressing Goal: Progress toward achieving an optimal weight will improve Outcome: Progressing   Problem: Skin Integrity: Goal: Risk for impaired skin integrity will decrease Outcome: Progressing   Problem: Tissue Perfusion: Goal: Adequacy of tissue perfusion will improve Outcome: Progressing   Problem: Education: Goal: Knowledge of condition and prescribed therapy will improve Outcome: Progressing   Problem: Cardiac: Goal: Will achieve and/or maintain adequate cardiac output Outcome: Progressing   Problem: Physical Regulation: Goal: Complications related to the disease process, condition or treatment will be avoided or minimized Outcome: Progressing   Problem: Education: Goal: Knowledge of General Education information will improve Description: Including pain rating scale, medication(s)/side effects and non-pharmacologic comfort measures Outcome: Progressing   Problem: Health Behavior/Discharge Planning: Goal: Ability to manage health-related needs will improve Outcome:  Progressing   Problem: Clinical Measurements: Goal: Ability to maintain clinical measurements within normal limits will improve Outcome: Progressing Goal: Will remain free from infection Outcome: Progressing Goal: Diagnostic test results will improve Outcome: Progressing Goal: Respiratory complications will improve Outcome: Progressing Goal: Cardiovascular complication will be avoided Outcome: Progressing   Problem: Activity: Goal: Risk for activity intolerance will decrease Outcome: Progressing   Problem: Nutrition: Goal: Adequate nutrition will be maintained Outcome: Progressing   Problem: Coping: Goal: Level of anxiety will decrease Outcome: Progressing   Problem: Elimination: Goal: Will not experience complications related to bowel motility Outcome: Progressing Goal: Will not experience complications related to urinary retention Outcome: Progressing   Problem: Pain Managment: Goal: General experience of comfort will improve and/or be controlled Outcome: Progressing   Problem: Safety: Goal: Ability to remain free from injury will improve Outcome: Progressing   Problem: Skin Integrity: Goal: Risk for impaired skin integrity will decrease Outcome: Progressing

## 2024-01-26 NOTE — Progress Notes (Signed)
HISTORY OF PRESENT ILLNESS:  Breanna [name] is a 89 y.o. female admitted with acute GI bleeding felt to be diverticular based on endoscopy and colonoscopy findings.  She has required several blood transfusions.  She is doing well since I saw her yesterday.  No bowel movements.  Tolerating regular diet.  No complaints as she sits comfortably in her recliner.  Hemoglobin increased appropriately after transfusion.  Son Breanna [name] in room.  Laboratories: Hemoglobin 7.2-->8.2   REVIEW OF SYSTEMS:  All non-GI ROS negative. Past Medical History:  Diagnosis Date   Adrenal adenoma 12/24/2004   Boils 12/25/2007   Cholelithiasis    asympt. w/normal HIDA 03/2010   CVA (cerebral infarction) 12/24/2008   Cerebellar   Diverticulitis    Esophageal spasm 12/24/2009   GERD (gastroesophageal reflux disease)    Gout    History of colon polyps    HTN (hypertension)    Hydronephrosis    LEFT/ Surgical intervention   Hyperlipidemia    LBP (low back pain)    Osteoarthritis    Pulmonary HTN (HCC)    Stress    Type II or unspecified type diabetes mellitus without mention of complication, not stated as uncontrolled     Past Surgical History:  Procedure Laterality Date   ABDOMINAL HYSTERECTOMY     APPENDECTOMY     2020   BACK SURGERY     BREAST BIOPSY     RIGHT   CATARACT EXTRACTION, BILATERAL     COLECTOMY  2006   Sigmoid   COLONOSCOPY Left 01/20/2024   Procedure: COLONOSCOPY;  Surgeon: Hilarie Fredrickson, MD;  Location: [facility];  Service: Gastroenterology;  Laterality: Left;   ESOPHAGOGASTRODUODENOSCOPY Left 01/19/2024   Procedure: ESOPHAGOGASTRODUODENOSCOPY (EGD);  Surgeon: Shellia Cleverly, DO;  Location: [facility];  Service: Gastroenterology;  Laterality: Left;   FOOT SURGERY     BILATERAL   HAMMER TOE SURGERY     INTRAMEDULLARY (IM) NAIL INTERTROCHANTERIC Right 07/21/2020   Procedure: INTRAMEDULLARY (IM) NAIL INTERTROCHANTRIC;  Surgeon: Sheral Apley, MD;  Location: [facility];   Service: Orthopedics;  Laterality: Right;   ROTATOR CUFF REPAIR  2008   RIGHT   TOTAL KNEE ARTHROPLASTY  2003   LEFT   VARICOSE VEIN SURGERY     vein stripping/lower extremities    Social History Breanna [name]  reports that she has never smoked. She has never used smokeless tobacco. She reports that she does not drink alcohol and does not use drugs.  family history includes Breast cancer in her daughter; Cancer in her daughter, father, and maternal uncle; Diabetes in her mother and another family member; Heart disease in her mother and another family member; Hypertension in her father and another family member; Prostate cancer in her father and maternal uncle.  Allergies  Allergen Reactions   Verapamil Shortness Of Breath    REACTION: SOB   Aspirin Itching    But tolerates low dose   Atenolol     REACTION: fatigue   Codeine Itching   Codeine Sulfate    Hydrochlorothiazide     REACTION: gout   Hydrocodone       side effects - hallucinations   Ibuprofen     Upset stomach w/high doses   Lisinopril     REACTION: cough   Onion Other (See Comments)    Dry mouth/ gets sores   Shellfish Allergy Swelling    Patient stated she does not eat shellfish, she swells up on different parts of the body  Statins     Dizzy, falls   Valsartan Itching   Adhesive  [Tape] Rash   Other Rash       PHYSICAL EXAMINATION: Vital signs: BP (!) 141/63 (BP Location: Right Arm)   Pulse (!) 49   Temp 98.1 F (36.7 C)   Resp 18   Ht 5\' 2"  (1.575 m)   Wt 48 kg   SpO2 100%   BMI 19.35 kg/m  General: Well-developed, well-nourished, no acute distress Abdomen: soft, nontender, nondistended. Psychiatric: alert and oriented x3. Cooperative   ASSESSMENT:  1.  Diverticular bleed.  Seems to have stopped. 2.  Acute blood loss anemia. 3.  General Medical problems.  Stable   PLAN:  1.  Continue to monitor stools and hemoglobin 2.  If no bleeding overnight, she should be able to be  discharged home tomorrow. We will sign off, but please contact us for any questions or problems, as we are available if needed.  Wilhemina Bonito. Eda Keys., M.D. [facility] Division of Gastroenterology

## 2024-01-27 DIAGNOSIS — R55 Syncope and collapse: Secondary | ICD-10-CM | POA: Diagnosis not present

## 2024-01-27 LAB — TYPE AND SCREEN
ABO/RH(D): AB POS
Antibody Screen: POSITIVE
Donor AG Type: NEGATIVE
Donor AG Type: NEGATIVE
Donor AG Type: NEGATIVE
Unit division: 0
Unit division: 0
Unit division: 0

## 2024-01-27 LAB — CBC WITH DIFFERENTIAL/PLATELET
Abs Immature Granulocytes: 0.11 10*3/uL — ABNORMAL HIGH (ref 0.00–0.07)
Basophils Absolute: 0 10*3/uL (ref 0.0–0.1)
Basophils Relative: 0 %
Eosinophils Absolute: 0.2 10*3/uL (ref 0.0–0.5)
Eosinophils Relative: 2 %
HCT: 28 % — ABNORMAL LOW (ref 36.0–46.0)
Hemoglobin: 8.8 g/dL — ABNORMAL LOW (ref 12.0–15.0)
Immature Granulocytes: 2 %
Lymphocytes Relative: 14 %
Lymphs Abs: 0.9 10*3/uL (ref 0.7–4.0)
MCH: 28.9 pg (ref 26.0–34.0)
MCHC: 31.4 g/dL (ref 30.0–36.0)
MCV: 92.1 fL (ref 80.0–100.0)
Monocytes Absolute: 0.7 10*3/uL (ref 0.1–1.0)
Monocytes Relative: 10 %
Neutro Abs: 5 10*3/uL (ref 1.7–7.7)
Neutrophils Relative %: 72 %
Platelets: 158 10*3/uL (ref 150–400)
RBC: 3.04 MIL/uL — ABNORMAL LOW (ref 3.87–5.11)
RDW: 16.9 % — ABNORMAL HIGH (ref 11.5–15.5)
WBC: 6.9 10*3/uL (ref 4.0–10.5)
nRBC: 0.3 % — ABNORMAL HIGH (ref 0.0–0.2)

## 2024-01-27 LAB — GLUCOSE, CAPILLARY
Glucose-Capillary: 91 mg/dL (ref 70–99)
Glucose-Capillary: 94 mg/dL (ref 70–99)
Glucose-Capillary: 187 mg/dL — ABNORMAL HIGH (ref 70–99)
Glucose-Capillary: 165 mg/dL — ABNORMAL HIGH (ref 70–99)
Glucose-Capillary: 153 mg/dL — ABNORMAL HIGH (ref 70–99)

## 2024-01-27 LAB — BPAM RBC
Blood Product Expiration Date: 202502242359
Blood Product Expiration Date: 202502242359
Blood Product Expiration Date: 202502272359
ISSUE DATE / TIME: 202502011036
Unit Type and Rh: 6200
Unit Type and Rh: 6200
Unit Type and Rh: 6200

## 2024-01-27 LAB — HEMOGLOBIN AND HEMATOCRIT, BLOOD
HCT: 27.6 % — ABNORMAL LOW (ref 36.0–46.0)
Hemoglobin: 8.6 g/dL — ABNORMAL LOW (ref 12.0–15.0)

## 2024-01-27 NOTE — Progress Notes (Signed)
TRH ROUNDING   NOTE Breanna [name] [id number]:[id number]  DOB: 12-16-1931  DOA: 01/17/2024  PCP: Tresa Garter, MD  01/27/2024,3:38 PM   LOS: 7 days      Code Status: Full code From: Home  current Dispo: Unclear   89 year old female Previous history left shoulder rotator cuff injury followed with Dr. 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 Last EGD 04/28/2010 = presbyesophagus dilated with 54 French Maloney multiple gastric polyps, gout stage IIIb CKD-at baseline is blind 9 mm hyperdense left superior pole kidney lesion  Events 01/17/2024 admission several days malaise chills lightheadedness on standing brief LOC with assisted fall--- found to have gross blood on DRE aspirin was held transfused 1 unit GI consulted Sodium 141 potassium 3.9 BUN/creatinine 61/1.1 (usually creatinine less than 1.3) Hemoglobin baseline October 2024 10-7.3 on arrival, platelet 105, WBC 8.4, respiratory viral panel    UA negative  procedures 1/25 transfused 1/26 upper endoscopy normal esophagus multiple polyps normal mucosa no areas of active bleeding 1/27 colonoscopy diverticulosis entire examined colon-5 mm cecal polyp not removed 1/29 transfused 1 U prbc again 2/1 transfuse 1 unit PRBC    Plan  Possible small bowel bleed Colonoscopy performedUpper Endo--no overt source, probable diverticular bleed that is reoslving-deferring to GI further work-up Some blood tinge but more solid stool today.  Tolerting solid food well If hemoglobin remains above 7.5 , and no overt dark stool, would d/c in am Anemia of acute blood loss Transfused last 2/1--hemoglobin stable after transfusion.  Monitor trends Iron 18/tsat 6--rec'd IV iron x 2  Syncope on admission secondary to anemia  Therapy feels that she can d/c home with a little help Blood pressures ok--not orthostatic HTN HFpEF EF 1/25--ef 60-65% grade 2  DD Continue amlodipine 5 only losartan 50 has been held Aldactone Monday Wednesday Friday held--Would not resume in the outpatient setting given advanced age etc. Hypernatremia azotemia Overall seems improved IVF stopped--has been overall stable Diabetes mellitus type 2 A1c has always been below seven 6.8 last check Continues on sliding scale-CBG 70-150 Metformin has been held from prior to admission and may not resume given advanced age and risk of hypoglycemia May continue gabapentin 100 twice daily for neuropathy Prior CVA Aspirin on hold At baseline blind--compensates well  DVT prophylaxis: Contraindicated  Status is: Inpatient Remains inpatient appropriate because:   not ready for discharge  Subjective:  Formed but blood tinges stool seen today Discussed likely implications of this as improvement [prior was watery , melenotic dark stool] Will watch overnight and if hemoglobin remains reasonable, can d/c home    Objective + exam Vitals:   01/26/24 2035 01/27/24 0500 01/27/24 0537 01/27/24 1209  BP: 134/64  (!) 166/75 (!) 144/70  Pulse: (!) 57  (!) 56 (!) 51  Resp: 18  18 18   Temp: (!) 97.5 F (36.4 C)  98 F (36.7 C) 98 F (36.7 C)  TempSrc:   Oral Oral  SpO2: 100%  100% 100%  Weight:  48 kg    Height:       Filed Weights   01/24/24 0500 01/25/24 0500 01/27/24 0500  Weight: 47.7 kg 48 kg 48 kg    Examination:   no distress she is blind in both eyes S1-S2 no murmur RRR Chest clear Abdomen soft no rebound no gaurd ROM intact power 5/5 No LE edema  Data Reviewed: reviewed   CBC    Component  Value Date/Time   WBC 6.9 01/27/2024 0511   RBC 3.04 (L) 01/27/2024 0511   HGB 8.6 (L) 01/27/2024 1202   HCT 27.6 (L) 01/27/2024 1202   PLT 158 01/27/2024 0511   MCV 92.1 01/27/2024 0511   MCH 28.9 01/27/2024 0511   MCHC 31.4 01/27/2024 0511   RDW 16.9 (H) 01/27/2024 0511   LYMPHSABS 0.9 01/27/2024 0511   MONOABS 0.7 01/27/2024 0511   EOSABS 0.2  01/27/2024 0511   BASOSABS 0.0 01/27/2024 0511      Latest Ref Rng & Units 01/26/2024    5:38 AM 01/25/2024    6:59 AM 01/24/2024    5:12 AM  CMP  Glucose 70 - 99 mg/dL 94  98  098   BUN 8 - 23 mg/dL 39  44  52   Creatinine 0.44 - 1.00 mg/dL 1.19  1.47  8.29   Sodium 135 - 145 mmol/L 142  142  138   Potassium 3.5 - 5.1 mmol/L 4.0  3.8  3.4   Chloride 98 - 111 mmol/L 114  113  111   CO2 22 - 32 mmol/L 23  23  23    Calcium 8.9 - 10.3 mg/dL 8.5  8.5  8.3     Scheduled Meds:  acetaminophen  650 mg Oral Once   allopurinol  50 mg Oral Daily   amLODipine  5 mg Oral Daily   ascorbic acid  500 mg Oral Daily   diphenhydrAMINE  25 mg Oral Once   dorzolamide-timolol  1 drop Both Eyes BID   feeding supplement  237 mL Oral BID BM   furosemide  20 mg Intravenous Once   gabapentin  100 mg Oral BID   insulin aspart  0-6 Units Subcutaneous Q4H   latanoprost  1 drop Both Eyes QHS   sertraline  100 mg Oral Daily   sodium chloride flush  3 mL Intravenous Q12H   Continuous Infusions:   Time  24  Rhetta Mura, MD  [facility]

## 2024-01-27 NOTE — Progress Notes (Signed)
   01/27/24 1400  Spiritual Encounters  Type of Visit Initial  Care provided to: Pt and family  Referral source Patient request;Chaplain assessment;Clinical staff  Reason for visit Urgent spiritual support  OnCall Visit No  Spiritual Framework  Presenting Themes Meaning/purpose/sources of inspiration;Values and beliefs;Caregiving needs;Coping tools;Impactful experiences and emotions;Courage hope and growth;Rituals and practive;Community and relationships  Patient Stress Factors Loss of control;Lack of knowledge  Family Stress Factors None identified  Interventions  Spiritual Care Interventions Made Established relationship of care and support;Compassionate presence;Reflective listening;Normalization of emotions;Reconciliation with self/others;Narrative/life review;Explored values/beliefs/practices/strengths;Meaning making;Prayer  Intervention Outcomes  Outcomes Connection to spiritual care;Awareness around self/spiritual resourses;Connection to values and goals of care;Autonomy/agency;Awareness of health;Awareness of support;Reduced anxiety;Reduced isolation;Patient family open to resources  Spiritual Care Plan  Spiritual Care Issues Still Outstanding Lunette Stands will continue to follow   Chaplain visited patient who had three visitors in room this afternoon.  One was her adult son and the other were two neighbors.  Lots of laughter and energized talk amoung them all.  Great support system for patient.  Vista described her life narrative and what brought her strength and purpose.  She exhibited no emotional or dpsiritual distress at this time. Ended visit with prayer.

## 2024-01-27 NOTE — Progress Notes (Signed)
RN received phone call from patient's son, Breanna [name] at 2010. Patient's son has requested for RN to place note stating to NOT give patient acetaminophen due his concern from reading on the internet that it causes bleeding. RN explained to patient's son that long term usage may cause liver damage and that RN will place note and pass off during shift report to not administer to patient. Patient's son stated he will be back in the morning and would like to discuss concerns with physician.

## 2024-01-27 NOTE — Plan of Care (Signed)
  Problem: Nutritional: Goal: Maintenance of adequate nutrition will improve Outcome: Progressing   Problem: Skin Integrity: Goal: Risk for impaired skin integrity will decrease Outcome: Progressing   Problem: Pain Managment: Goal: General experience of comfort will improve and/or be controlled Outcome: Progressing   Problem: Safety: Goal: Ability to remain free from injury will improve Outcome: Progressing

## 2024-01-27 NOTE — TOC CM/SW Note (Signed)
 [facility] Quality rating ???? Patient survey rating ????   [facility] Quality rating ????? Patient survey rating???   [facility] [phone] Quality rating???? Patient survey rating????   [facility] [phone] Quality rating???? Patient survey rating????   [facility] [phone] Quality rating ???? Patient survey rating???   [facility] Quality rating??? Patient survey rating???   [facility] [phone] Quality rating??? Patient survey rating ????   [facility] [phone] Quality rating ????   [facility] Quality rating ??? Patient survey rating ????   [facility] Quality rating ??? Patient survey rating???   [facility] Quality rating ????? Patient survey rating ???   [facility] [phone] Quality rating ????? Patient survey rating ????

## 2024-01-27 NOTE — Progress Notes (Signed)
Physical Therapy Treatment Patient Details Name: Breanna [name] MRN: [id number] DOB: June 06, 1931 Today's Date: 01/27/2024   History of Present Illness Pt is 89 yo female admitted on 1/01/28/24 with syncope, anemia, and possible small bowel bleed.  Has required multiple PRBC transfusions.  Had colonscopy and upper endoscopy.  Pt with hx including but not limited to L rotator cuff injury, DM2, HTN, GERD, sigmoid colectomy.    PT Comments  General Comments: AxO 2 pleasant Cristopher Estimable Blind pt who plans to return home with Family support when medically stable.  Son was present during session. Assisted OOB to amb to bathroom.  General bed mobility comments: Increased time and cues; light min A to scoot.  General transfer comment: Light min A; significantly increased time; cues for hand placement.  Also assisted with a toilet transfer. General Gait Details: assisted with amb to and from bathroom with walker and VC's foir direction.  "Follow the walker" as Therapist guided.  VC's to "stay withing the walker" due to no vision. Noted formed dark stool with bright strigy blood.  Alerted MD as he arrived to rrom.  Then amb from bathroom to recliner and positioned to comfort.     If plan is discharge home, recommend the following: A little help with walking and/or transfers;A little help with bathing/dressing/bathroom;Assistance with cooking/housework;Help with stairs or ramp for entrance   Can travel by private vehicle        Equipment Recommendations  None recommended by PT    Recommendations for Other Services       Precautions / Restrictions Precautions Precautions: Fall Precaution Comments: Legally Blind Restrictions Weight Bearing Restrictions Per Provider Order: No     Mobility  Bed Mobility Overal bed mobility: Needs Assistance Bed Mobility: Supine to Sit     Supine to sit: Contact guard, Min assist     General bed mobility comments: Increased time and cues; light min A to scoot     Transfers Overall transfer level: Needs assistance Equipment used: Rolling walker (2 wheels) Transfers: Sit to/from Stand Sit to Stand: From elevated surface, Min assist           General transfer comment: Light min A; significantly increased time; cues for hand placement.  Also assisted with a toilet transfer.    Ambulation/Gait Ambulation/Gait assistance: Min assist Gait Distance (Feet): 22 Feet (11 feet x 2 to and from bathroom) Assistive device: Rolling walker (2 wheels) Gait Pattern/deviations: Step-to pattern, Decreased stride length, Shuffle, Trunk flexed Gait velocity: decreased     General Gait Details: assisted with amb to and from bathroom with walker and VC's foir direction.  "Follow the walker" as Therapist guided.  VC's to "stay withing the walker" due to no vision.   Stairs             Wheelchair Mobility     Tilt Bed    Modified Rankin (Stroke Patients Only)       Balance                                            Cognition   Behavior During Therapy: WFL for tasks assessed/performed Overall Cognitive Status: Impaired/Different from baseline                                 General Comments: AxO 2  pleasant Cristopher Estimable Blind pt who plans to return home with Family support when medically stable.  Son was present during session.        Exercises      General Comments        Pertinent Vitals/Pain Pain Assessment Pain Assessment: No/denies pain    Home Living                          Prior Function            PT Goals (current goals can now be found in the care plan section) Progress towards PT goals: Progressing toward goals    Frequency    Min 1X/week      PT Plan      Co-evaluation              AM-PAC PT "6 Clicks" Mobility   Outcome Measure  Help needed turning from your back to your side while in a flat bed without using bedrails?: A Little Help needed  moving from lying on your back to sitting on the side of a flat bed without using bedrails?: A Little Help needed moving to and from a bed to a chair (including a wheelchair)?: A Little Help needed standing up from a chair using your arms (e.g., wheelchair or bedside chair)?: A Little Help needed to walk in hospital room?: A Little Help needed climbing 3-5 steps with a railing? : A Lot 6 Click Score: 17    End of Session Equipment Utilized During Treatment: Gait belt Activity Tolerance: Patient tolerated treatment well Patient left: with chair alarm set;in chair;with call bell/phone within reach;with family/visitor present Nurse Communication: Mobility status PT Visit Diagnosis: Other abnormalities of gait and mobility (R26.89);Muscle weakness (generalized) (M62.81)     Time: 7829-5621 PT Time Calculation (min) (ACUTE ONLY): 24 min  Charges:    $Gait Training: 8-22 mins $Therapeutic Activity: 8-22 mins PT General Charges $$ ACUTE PT VISIT: 1 Visit                     Felecia Shelling  PTA Acute  Rehabilitation Services Office M-F          [phone]

## 2024-01-27 NOTE — TOC Progression Note (Signed)
Transition of Care [facility]) - Progression Note    Patient Details  Name: Breanna [name] MRN: [id number] Date of Birth: 06/23/1931  Transition of Care [facility]) CM/SW Contact  Otelia Santee, LCSW Phone Number: 01/27/2024, 1:16 PM  Clinical Narrative:    Met with pt and son at bedside to discuss HHA of choice. Per son pt has had HH services with [facility] in the past however, they do not have an agency preference. HHPT/OT has been arranged with [facility]. HH orders will need to be placed prior to discharge.    Expected Discharge Plan: Home/Self Care    Expected Discharge Plan and Services                                   HH Arranged: PT, OT [facility] Agency: [facility] Date [facility] Agency Contacted: 01/27/24 Time HH Agency Contacted: 1314 Representative spoke with at [facility] Agency: [facility]ind[facility]   Social Determinants of Health (SDOH) Interventions SDOH Screenings   Food Insecurity: No Food Insecurity (01/18/2024)  Housing: Low Risk  (01/18/2024)  Transportation Needs: No Transportation Needs (01/18/2024)  Utilities: Not At Risk (01/18/2024)  Alcohol Screen: Low Risk  (12/06/2023)  Depression (PHQ2-9): Low Risk  (12/06/2023)  Financial Resource Strain: Low Risk  (12/06/2023)  Physical Activity: Inactive (12/06/2023)  Social Connections: Moderately Integrated (01/18/2024)  Stress: No Stress Concern Present (12/06/2023)  Tobacco Use: Low Risk  (01/20/2024)  Health Literacy: Inadequate Health Literacy (12/06/2023)    Readmission Risk Interventions    01/27/2024    1:14 PM 01/20/2024    3:32 PM  Readmission Risk Prevention Plan  Transportation Screening Complete Complete  PCP or Specialist Appt within 3-5 Days Complete Complete  HRI or Home Care Consult Complete Complete  Social Work Consult for Recovery Care Planning/Counseling Complete Complete  Palliative Care Screening Not Applicable Not Applicable  Medication Review Oceanographer) Complete Complete

## 2024-01-28 DIAGNOSIS — R55 Syncope and collapse: Secondary | ICD-10-CM | POA: Diagnosis not present

## 2024-01-28 LAB — CBC WITH DIFFERENTIAL/PLATELET
Abs Immature Granulocytes: 0.05 10*3/uL (ref 0.00–0.07)
Basophils Absolute: 0 10*3/uL (ref 0.0–0.1)
Basophils Relative: 0 %
Eosinophils Absolute: 0.2 10*3/uL (ref 0.0–0.5)
Eosinophils Relative: 3 %
HCT: 26.3 % — ABNORMAL LOW (ref 36.0–46.0)
Hemoglobin: 8.3 g/dL — ABNORMAL LOW (ref 12.0–15.0)
Immature Granulocytes: 1 %
Lymphocytes Relative: 16 %
Lymphs Abs: 1 10*3/uL (ref 0.7–4.0)
MCH: 29.1 pg (ref 26.0–34.0)
MCHC: 31.6 g/dL (ref 30.0–36.0)
MCV: 92.3 fL (ref 80.0–100.0)
Monocytes Absolute: 0.5 10*3/uL (ref 0.1–1.0)
Monocytes Relative: 9 %
Neutro Abs: 4.5 10*3/uL (ref 1.7–7.7)
Neutrophils Relative %: 71 %
Platelets: 166 10*3/uL (ref 150–400)
RBC: 2.85 MIL/uL — ABNORMAL LOW (ref 3.87–5.11)
RDW: 16.4 % — ABNORMAL HIGH (ref 11.5–15.5)
WBC: 6.2 10*3/uL (ref 4.0–10.5)
nRBC: 0 % (ref 0.0–0.2)

## 2024-01-28 LAB — GLUCOSE, CAPILLARY
Glucose-Capillary: 79 mg/dL (ref 70–99)
Glucose-Capillary: 89 mg/dL (ref 70–99)
Glucose-Capillary: 95 mg/dL (ref 70–99)

## 2024-01-28 NOTE — TOC Transition Note (Signed)
 Transition of Care [facility]) - Discharge Note   Patient Details  Name: Breanna [name] MRN: [id number] Date of Birth: [birth year]  Transition of Care [facility]) CM/SW Contact:  Hoy DELENA Bigness, LCSW Phone Number: 01/28/2024, 9:15 AM   Clinical Narrative:    Pt to return home with family. Pt to receive HHPT/OT through [facility]. Orders in place. No further TOC needs identified.    Final next level of care: Home w Home Health Services Barriers to Discharge: No Barriers Identified   Patient Goals and CMS Choice Patient states their goals for this hospitalization and ongoing recovery are:: To return home CMS Medicare.gov Compare Post Acute Care list provided to:: Patient Choice offered to / list presented to : Patient [place] ownership interest in [facility].provided to::  (NA)    Discharge Placement                       Discharge Plan and Services Additional resources added to the After Visit Summary for                  DME Arranged: N/A DME Agency: NA       HH Arranged: PT, OT HH Agency: [facility] Date [facility] Agency Contacted: 01/27/24 Time HH Agency Contacted: [phone] Representative spoke with at [facility] Agency: Cindie  Social Drivers of Health (SDOH) Interventions SDOH Screenings   Food Insecurity: No Food Insecurity (01/18/2024)  Housing: Low Risk  (01/18/2024)  Transportation Needs: No Transportation Needs (01/18/2024)  Utilities: Not At Risk (01/18/2024)  Alcohol  Screen: Low Risk  (12/06/2023)  Depression (PHQ2-9): Low Risk  (12/06/2023)  Financial Resource Strain: Low Risk  (12/06/2023)  Physical Activity: Inactive (12/06/2023)  Social Connections: Moderately Integrated (01/18/2024)  Stress: No Stress Concern Present (12/06/2023)  Tobacco Use: Low Risk  (01/20/2024)  Health Literacy: Inadequate Health Literacy (12/06/2023)     Readmission Risk Interventions    01/27/2024    1:14 PM 01/20/2024    3:32 PM  Readmission Risk Prevention Plan   Transportation Screening Complete Complete  PCP or Specialist Appt within 3-5 Days Complete Complete  HRI or Home Care Consult Complete Complete  Social Work Consult for Recovery Care Planning/Counseling Complete Complete  Palliative Care Screening Not Applicable Not Applicable  Medication Review Oceanographer) Complete Complete

## 2024-01-28 NOTE — Discharge Summary (Signed)
 Physician Discharge Summary  Breanna [name] FMW:[id number] DOB: 08-17-1931 DOA: 01/17/2024  PCP: Garald Karlynn GAILS, MD  Admit date: 01/17/2024 Discharge date: 01/28/2024  Time spent: 27 minutes  Recommendations for Outpatient Follow-up:  Needs Chem-12 CBC 1 week Outpatient follow-up with GI as per them Recommend iron  studies in about 1 month and may need to be set up for IV iron  infusions Recommend titration of blood pressure meds this hospital stay discontinued several such as losartan  Aldactone  as below and is not a candidate for any further nonsteroidals or aspirin  for preventative measures  Discharge Diagnoses:  MAIN problem for hospitalization   Diverticular bleed  Please see below for itemized issues addressed in HOpsital- refer to other progress notes for clarity if needed  Discharge Condition: Improved  Diet recommendation: Regular  Filed Weights   01/25/24 0500 01/27/24 0500 01/28/24 0500  Weight: 48 kg 48 kg 48.7 kg    History of present illness:  89 year old female Previous history left shoulder rotator cuff injury followed with Dr. 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 Last EGD 04/28/2010 = presbyesophagus dilated with 54 French Maloney multiple gastric polyps, gout stage IIIb CKD-at baseline is blind 9 mm hyperdense left superior pole kidney lesion   Events 01/17/2024 admission several days malaise chills lightheadedness on standing brief LOC with assisted fall--- found to have gross blood on DRE aspirin  was held transfused 1 unit GI consulted Sodium 141 potassium 3.9 BUN/creatinine 61/1.1 (usually creatinine less than 1.3) Hemoglobin baseline October 2024 10-7.3 on arrival, platelet 105, WBC 8.4, respiratory viral panel    UA negative   procedures 1/25 transfused 1/26 upper endoscopy normal esophagus multiple polyps normal mucosa no areas of active  bleeding 1/27 colonoscopy diverticulosis entire examined colon-5 mm cecal polyp not removed 1/29 transfused 1 U prbc again 2/1 transfuse 1 unit PRBC       Plan   Confirmed diverticular bleed-no concerns for small bowel bleed Colonoscopy performedUpper Endo--no overt source, probable diverticular bleed that is reoslving-deferring to GI further work-up Started to have more solid stool on 2/3 ll Hemoglobin has remained above 8 after the last transfusion stool is more formed she has not had any further reports of large amounts of bloody stool or melena-I discussed the case with gastroenterology who feels that she is as optimized as she will be for discharge and she will be going home and she may be followed up by them if felt indicated by PCP Anemia of acute blood loss secondary to bleeding from GI Transfused last 2/1--hemoglobin stable after transfusion.  Monitor trends as an outpatient Iron  18/tsat 6--rec'd IV iron  x 2  Check CBC in about a week and check anemia studies in about 1 month Syncope on admission secondary to anemia  Therapy feels that she can d/c home with a little help-will order home health PT Blood pressures ok--not orthostatic HTN HFpEF EF 1/25--ef 60-65% grade 2 DD Continue amlodipine  5 only-blood pressures have been reasonably controlled-would not resume meds as below losartan  50 has been held Aldactone  Monday Wednesday Friday held--Would not resume in the outpatient setting given advanced age etc. Hypernatremia azotemia Overall seems improved IVF stopped--has been overall stable Diabetes mellitus type 2 A1c has always been below seven 6.8 last check Continues on sliding scale-CBG moderately controlled and never above 180 or 200 so I do not think she needs metformin  Metformin  has been held from prior to admission and may  not resume given advanced age and risk of hypoglycemia May continue gabapentin  100 twice daily for neuropathy Prior CVA Aspirin  on hold At baseline  blind--compensates well   Discharge Exam: Vitals:   01/27/24 2004 01/28/24 0450  BP: (!) 144/66 (!) 164/69  Pulse: (!) 59 (!) 52  Resp: 16 17  Temp:  98.4 F (36.9 C)  SpO2: 100% 100%    Subj on day of d/c   In good spirits sitting in the bed No complaints Seems to be eating and drinking okay  General Exam on discharge  EOMI NCAT no focal deficit no icterus no pallor No wheeze rales rhonchi ROM intact Power 5/5 S1-S2 no murmur  Discharge Instructions   Discharge Instructions     Diet - low sodium heart healthy   Complete by: As directed    Discharge instructions   Complete by: As directed    This hospital stay you were diagnosed with a very slow bleed from the rectum and this necessitated transfusion several times Please do not use any over-the-counter medications such as ibuprofen  or Naprosyn or anything that is considered a nonsteroidal anti-inflammatory drug or NSAID-Tylenol  is reasonable 1 to 2 tablets for moderate pain it does not cause kidney damage You will also notice that we have discontinued several blood pressure medications such as losartan  and do not take aspirin  or Aldactone  either You do not need metformin  as your sugars were pretty well-controlled you can check your sugars if you need--- would report sugars above 200 consistently to your primary doctor Please take Protonix  1 tablet every day to protect your stomach Please report large amounts of blood from rectum, lightheadedness or dizziness and come to the emergency room   Increase activity slowly   Complete by: As directed       Allergies as of 01/28/2024       Reactions   Verapamil Shortness Of Breath   REACTION: SOB   Aspirin  Itching   But tolerates low dose   Atenolol    REACTION: fatigue   Codeine Itching   Codeine Sulfate    Hydrochlorothiazide    REACTION: gout   Hydrocodone      side effects - hallucinations   Ibuprofen     Upset stomach w/high doses   Lisinopril    REACTION:  cough   Onion Other (See Comments)   Dry mouth/ gets sores   Shellfish Allergy Swelling   Patient stated she does not eat shellfish, she swells up on different parts of the body   Statins    Dizzy, falls   Valsartan Itching   Adhesive  [tape] Rash   Other Rash        Medication List     STOP taking these medications    ascorbic acid  500 MG tablet Commonly known as: VITAMIN C    Aspirin  Low Dose 81 MG tablet Generic drug: aspirin  EC   calcium  carbonate 1500 (600 Ca) MG Tabs tablet Commonly known as: OSCAL   diclofenac  sodium 1 % Gel Commonly known as: VOLTAREN    docusate sodium  100 MG capsule Commonly known as: COLACE   lidocaine  5 % Commonly known as: LIDODERM    lidocaine -prilocaine  cream Commonly known as: EMLA    losartan  100 MG tablet Commonly known as: COZAAR    losartan  50 MG tablet Commonly known as: COZAAR    metFORMIN  500 MG tablet Commonly known as: GLUCOPHAGE    mupirocin  ointment 2 % Commonly known as: BACTROBAN    polyethylene glycol 17 g packet Commonly known as: MIRALAX  / GLYCOLAX   spironolactone  25 MG tablet Commonly known as: ALDACTONE    traMADol  50 MG tablet Commonly known as: ULTRAM    True Comfort Alcohol  Prep Pads 70 % Pads   True Metrix Level 1 Low Soln   TRUEplus Lancets 33G Misc   valACYclovir  500 MG tablet Commonly known as: Valtrex    Vitamin D3 25 MCG (1000 UT) Caps       TAKE these medications    acetaminophen  325 MG tablet Commonly known as: TYLENOL  Take 2 tablets (650 mg total) by mouth every 6 (six) hours as needed for moderate pain or fever.   allopurinol  100 MG tablet Commonly known as: ZYLOPRIM  Take 0.5 tablets (50 mg total) by mouth daily.   amLODipine  5 MG tablet Commonly known as: NORVASC  Take 1 tablet (5 mg total) by mouth daily.   atropine  1 % ophthalmic solution Place 1 drop into both eyes daily.   brimonidine  0.2 % ophthalmic solution Commonly known as: ALPHAGAN  Place 1 drop into the right  eye 2 (two) times daily.   [employer] VITAMIN B12 1000 MCG tablet Generic drug: cyanocobalamin  TAKE 1 TABLET (1,000 MCG TOTAL) BY MOUTH EVERY DAY   dorzolamide -timolol  2-0.5 % ophthalmic solution Commonly known as: COSOPT  Place 1 drop into both eyes 2 (two) times daily.   feeding supplement Liqd Take 237 mLs by mouth daily at 2 PM.   gabapentin  100 MG capsule Commonly known as: NEURONTIN  Take 1 capsule (100 mg total) by mouth 2 (two) times daily.   GNP True Metrix Glucose Strips test strip Generic drug: glucose blood Use to check blood sugars twice a day   latanoprost  0.005 % ophthalmic solution Commonly known as: XALATAN  Place 1 drop into both eyes at bedtime.   Netarsudil-Latanoprost  0.02-0.005 % Soln Apply to eye.   NON FORMULARY Place 10 drops under the tongue daily as needed (Pain). Medication: CBD oil   pantoprazole  40 MG tablet Commonly known as: PROTONIX  TAKE 1 TABLET BY MOUTH EVERY DAY   rosuvastatin  5 MG tablet Commonly known as: CRESTOR  Take 5 mg by mouth daily.   senna 8.6 MG Tabs tablet Commonly known as: SENOKOT Take 1 tablet (8.6 mg total) by mouth 2 (two) times daily.   sertraline  100 MG tablet Commonly known as: ZOLOFT  TAKE 1 TABLET BY MOUTH EVERY DAY       Allergies  Allergen Reactions   Verapamil Shortness Of Breath    REACTION: SOB   Aspirin  Itching    But tolerates low dose   Atenolol     REACTION: fatigue   Codeine Itching   Codeine Sulfate    Hydrochlorothiazide     REACTION: gout   Hydrocodone        side effects - hallucinations   Ibuprofen      Upset stomach w/high doses   Lisinopril     REACTION: cough   Onion Other (See Comments)    Dry mouth/ gets sores   Shellfish Allergy Swelling    Patient stated she does not eat shellfish, she swells up on different parts of the body   Statins     Dizzy, falls   Valsartan Itching   Adhesive  [Tape] Rash   Other Rash    Follow-up Information     [facility].    Specialty: Home Health Services Why: Hedda to follow up with you at discharge to provide home health services Contact information: [address] [place] [address] [phone]  The results of significant diagnostics from this hospitalization (including imaging, microbiology, ancillary and laboratory) are listed below for reference.    Significant Diagnostic Studies: ECHOCARDIOGRAM COMPLETE Result Date: 01/18/2024    ECHOCARDIOGRAM REPORT   Patient Name:   Breanna [name] Date of Exam: 01/18/2024 Medical Rec #:  [id number]       Height:       62.0 in Accession #:    [id number]      Weight:       113.0 lb Date of Birth:  [birth year]        BSA:          1.500 m Patient Age:    89 years        BP:           173/89 mmHg Patient Gender: F               HR:           55 bpm. Exam Location:  Inpatient Procedure: 2D Echo, Cardiac Doppler and Color Doppler Indications:    Syncope  History:        Patient has prior history of Echocardiogram examinations, most                 recent 03/01/2023. Arrythmias:Bradycardia; Risk                 Factors:Hypertension and Diabetes.  Sonographer:    Lanell Maduro Referring Phys: [id number] TIMOTHY S OPYD IMPRESSIONS  1. Left ventricular ejection fraction, by estimation, is 60 to 65%. The left ventricle has normal function. The left ventricle has no regional wall motion abnormalities. There is moderate left ventricular hypertrophy. Left ventricular diastolic parameters are consistent with Grade II diastolic dysfunction (pseudonormalization). Elevated left atrial pressure. The average left ventricular global longitudinal strain is -20.3 %.  2. Right ventricular systolic function is normal. The right ventricular size is normal. There is moderately elevated pulmonary artery systolic pressure. The estimated right ventricular systolic pressure is 47.4 mmHg.  3. Left atrial size was severely dilated.  4. The mitral valve is degenerative.  Moderate mitral valve regurgitation.  5. Tricuspid valve regurgitation is mild to moderate.  6. The aortic valve is tricuspid. Aortic valve regurgitation is trivial. Aortic valve sclerosis is present, with no evidence of aortic valve stenosis.  7. The inferior vena cava is normal in size with greater than 50% respiratory variability, suggesting right atrial pressure of 3 mmHg. FINDINGS  Left Ventricle: Left ventricular ejection fraction, by estimation, is 60 to 65%. The left ventricle has normal function. The left ventricle has no regional wall motion abnormalities. The average left ventricular global longitudinal strain is -20.3 %. The left ventricular internal cavity size was normal in size. There is moderate left ventricular hypertrophy. Left ventricular diastolic parameters are consistent with Grade II diastolic dysfunction (pseudonormalization). Elevated left atrial pressure. Right Ventricle: The right ventricular size is normal. No increase in right ventricular wall thickness. Right ventricular systolic function is normal. There is moderately elevated pulmonary artery systolic pressure. The tricuspid regurgitant velocity is 3.33 m/s, and with an assumed right atrial pressure of 3 mmHg, the estimated right ventricular systolic pressure is 47.4 mmHg. Left Atrium: Left atrial size was severely dilated. Right Atrium: Right atrial size was normal in size. Pericardium: There is no evidence of pericardial effusion. Mitral Valve: The mitral valve is degenerative in appearance. Moderate mitral valve regurgitation. Tricuspid Valve: The tricuspid valve is normal in structure. Tricuspid valve regurgitation is mild  to moderate. Aortic Valve: The aortic valve is tricuspid. Aortic valve regurgitation is trivial. Aortic regurgitation PHT measures 654 msec. Aortic valve sclerosis is present, with no evidence of aortic valve stenosis. Pulmonic Valve: The pulmonic valve was not well visualized. Pulmonic valve regurgitation is  not visualized. Aorta: The aortic root and ascending aorta are structurally normal, with no evidence of dilitation. Venous: The inferior vena cava is normal in size with greater than 50% respiratory variability, suggesting right atrial pressure of 3 mmHg. IAS/Shunts: The interatrial septum was not well visualized.  LEFT VENTRICLE PLAX 2D LVIDd:         3.80 cm     Diastology LVIDs:         2.40 cm     LV e' medial:    3.92 cm/s LV PW:         1.60 cm     LV E/e' medial:  18.6 LV IVS:        1.60 cm     LV e' lateral:   4.46 cm/s LVOT diam:     2.00 cm     LV E/e' lateral: 16.4 LV SV:         64 LV SV Index:   43          2D Longitudinal Strain LVOT Area:     3.14 cm    2D Strain GLS Avg:     -20.3 %  LV Volumes (MOD) LV vol d, MOD A2C: 54.4 ml LV vol d, MOD A4C: 68.2 ml LV vol s, MOD A2C: 21.8 ml LV vol s, MOD A4C: 23.4 ml LV SV MOD A2C:     32.6 ml LV SV MOD A4C:     68.2 ml LV SV MOD BP:      38.2 ml RIGHT VENTRICLE             IVC RV Basal diam:  3.00 cm     IVC diam: 1.50 cm RV S prime:     14.50 cm/s TAPSE (M-mode): 2.9 cm LEFT ATRIUM              Index        RIGHT ATRIUM           Index LA diam:        4.70 cm  3.13 cm/m   RA Area:     10.60 cm LA Vol (A2C):   98.4 ml  65.61 ml/m  RA Volume:   21.30 ml  14.20 ml/m LA Vol (A4C):   120.0 ml 80.01 ml/m LA Biplane Vol: 116.0 ml 77.34 ml/m  AORTIC VALVE LVOT Vmax:   97.30 cm/s LVOT Vmean:  68.000 cm/s LVOT VTI:    0.203 m AI PHT:      654 msec  AORTA Ao Root diam: 3.10 cm Ao Asc diam:  3.20 cm MITRAL VALVE                  TRICUSPID VALVE MV Area (PHT): 3.65 cm       TR Peak grad:   44.4 mmHg MV Decel Time: 208 msec       TR Vmax:        333.00 cm/s MR Peak grad:    169.5 mmHg MR Mean grad:    108.0 mmHg   SHUNTS MR Vmax:         651.00 cm/s  Systemic VTI:  0.20 m MR Vmean:        495.0 cm/s  Systemic Diam: 2.00 cm MR PISA:         1.01 cm MR PISA Eff ROA: 6 mm MR PISA Radius:  0.40 cm MV E velocity: 73.10 cm/s MV A velocity: 120.00 cm/s MV E/A ratio:   0.61 Lonni Nanas MD Electronically signed by Lonni Nanas MD Signature Date/Time: 01/18/2024/1:10:41 PM    Final    DG Chest Portable 1 View Result Date: 01/17/2024 CLINICAL DATA:  Fever.  Vomiting and diarrhea for 3 days.  Syncope EXAM: PORTABLE CHEST 1 VIEW COMPARISON:  X-ray 02/28/2023. FINDINGS: Hyperinflation. No consolidation, pneumothorax or effusion. No edema. Enlarged cardiopericardial silhouette tortuous aorta, similar to previous. Osteopenia. IMPRESSION: Hyperinflation.  Enlarged heart.  No consolidation. Electronically Signed   By: Ranell Bring M.D.   On: 01/17/2024 17:50    Microbiology: No results found for this or any previous visit (from the past 240 hours).   Labs: Basic Metabolic Panel: Recent Labs  Lab 01/22/24 0604 01/23/24 0546 01/24/24 0512 01/25/24 0659 01/26/24 0538  NA 135 140 138 142 142  K 4.1 4.1 3.4* 3.8 4.0  CL 108 113* 111 113* 114*  CO2 19* 20* 23 23 23   GLUCOSE 111* 97 113* 98 94  BUN 61* 61* 52* 44* 39*  CREATININE 0.83 1.10* 1.06* 0.97 0.91  CALCIUM  8.3* 8.2* 8.3* 8.5* 8.5*   Liver Function Tests: No results for input(s): AST, ALT, ALKPHOS, BILITOT, PROT, ALBUMIN in the last 168 hours. No results for input(s): LIPASE, AMYLASE in the last 168 hours. No results for input(s): AMMONIA in the last 168 hours. CBC: Recent Labs  Lab 01/24/24 0512 01/24/24 0739 01/25/24 0659 01/26/24 0538 01/27/24 0511 01/27/24 1202 01/28/24 0548  WBC 7.1  --  7.4 7.3 6.9  --  6.2  NEUTROABS 5.1  --  5.3 5.2 5.0  --  4.5  HGB 7.7*   < > 7.2* 8.2* 8.8* 8.6* 8.3*  HCT 23.8*   < > 22.9* 25.5* 28.0* 27.6* 26.3*  MCV 90.2  --  92.0 91.1 92.1  --  92.3  PLT 109*  --  127* 139* 158  --  166   < > = values in this interval not displayed.   Cardiac Enzymes: No results for input(s): CKTOTAL, CKMB, CKMBINDEX, TROPONINI in the last 168 hours. BNP: BNP (last 3 results) No results for input(s): BNP in the last 8760  hours.  ProBNP (last 3 results) No results for input(s): PROBNP in the last 8760 hours.  CBG: Recent Labs  Lab 01/27/24 1711 01/27/24 2006 01/28/24 0011 01/28/24 0451 01/28/24 0746  GLUCAP 165* 153* 95 89 79    Signed:  Colen Grimes MD   [facility] 01/28/2024, 8:22 AM

## 2024-01-29 ENCOUNTER — Telehealth: Payer: Self-pay | Admitting: *Deleted

## 2024-01-29 NOTE — Transitions of Care (Post Inpatient/ED Visit) (Addendum)
 01/29/2024  Name: Breanna [name] MRN: [id number] DOB: 1931-10-10  Today's TOC FU Call Status: Today's TOC FU Call Status:: Successful TOC FU Call Completed TOC FU Call Complete Date: 01/29/24 Patient's Name and Date of Birth confirmed.  Transition Care Management Follow-up Telephone Call Date of Discharge: 01/28/24 Discharge Facility: [facility]) Type of Discharge: Inpatient Admission Primary Inpatient Discharge Diagnosis:: syncope; anemia; diverticular bleed with endoscopy/ colonoscopy How have you been since you were released from the hospital?: Better (per son/ caregiver Garnette: She is weak but overall, she is okay; my brother and sister and I rotate staying with her at all times.  She has a PCS caregiver through social services that comes 4 days a week, they do her medications in a pill box) Any questions or concerns?: No  Items Reviewed: Did you receive and understand the discharge instructions provided?: Yes (thoroughly reviewed with patient's son/ caregiver who verbalizes good understanding of same) Medications obtained,verified, and reconciled?: Yes (Medications Reviewed) (Full medication reconciliation/ review completed; no concerns or discrepancies identified; PCS caregiver/ family members self-manages medications - son denies questions/ concerns around medications today) Any new allergies since your discharge?: No Dietary orders reviewed?: Yes Type of Diet Ordered:: Whatever she wants, but she eats light/ conservative and bland food mostly per son/ caregiver Do you have support at home?: Yes People in Home: child(ren), adult Name of Support/Comfort Primary Source: Caregiver/ son reports patient requires assistance with self-care activities; supportive adult children rotate residing with patient each week in her home and assists as/ if needed/ indicated -- patient also has PCS caregiver 4 days/ week for 2 hours each visit  Medications Reviewed Today: Medications  Reviewed Today     Reviewed by Shenelle Klas M, RN (Registered Nurse) on 01/29/24 at 1104  Med List Status: <None>   Medication Order Taking? Sig Documenting Provider Last Dose Status Informant  acetaminophen  (TYLENOL ) 325 MG tablet [id number] Yes Take 2 tablets (650 mg total) by mouth every 6 (six) hours as needed for moderate pain or fever. Dickie Begun, MD Taking Active Self, Child, Care Giver, Pharmacy Records  allopurinol  (ZYLOPRIM ) 100 MG tablet [id number] Yes Take 0.5 tablets (50 mg total) by mouth daily. Plotnikov, Aleksei V, MD Taking Active Self, Child, Care Giver, Pharmacy Records  amLODipine  (NORVASC ) 5 MG tablet [id number] Yes Take 1 tablet (5 mg total) by mouth daily. Plotnikov, Aleksei V, MD Taking Active Self, Child, Care Giver, Pharmacy Records  atropine  1 % ophthalmic solution [id number] No Place 1 drop into both eyes daily.  Patient not taking: Reported on 01/29/2024   [provider] Not Taking Active Self, Child, Care Giver, Pharmacy Records           Med Note (Kirstein Baxley M   Wed Jan 29, 2024 10:49 AM) 01/29/24: Reports during TOC call- she does not take  brimonidine  (ALPHAGAN ) 0.2 % ophthalmic solution [id number] No Place 1 drop into the right eye 2 (two) times daily.  Patient not taking: Reported on 01/17/2024   [provider] Not Taking Active Self, Child, Care Giver, Pharmacy Records           Med Note (Kadon Andrus M   Wed Jan 29, 2024 10:49 AM) 01/29/24: Reports during TOC call- she does not take   [facility] VITAMIN B12 1000 MCG tablet [id number] Yes TAKE 1 TABLET (1,000 MCG TOTAL) BY MOUTH EVERY DAY Plotnikov, Aleksei V, MD Taking Active Self, Child, Care Giver, Pharmacy Records  dorzolamide -timolol  (COSOPT ) 22.3-6.8 MG/ML ophthalmic solution [id number] Yes  Place 1 drop into both eyes 2 (two) times daily.  [provider] Taking Active Self, Child, Care Giver, Pharmacy Records           Med Note BIRDIA ANTON CHRISTELLA Pablo Mar 11, 2017  8:33 AM)     feeding supplement, IAN GERMAINE COOLER [place]) [place] [id number] Yes Take 237 mLs by mouth daily at 2 PM. Dickie Begun, MD Taking Active Self, Child, Care Giver, Pharmacy Records  gabapentin  (NEURONTIN ) 100 MG capsule [id number] Yes Take 1 capsule (100 mg total) by mouth 2 (two) times daily. Plotnikov, Karlynn GAILS, MD Taking Active Self, Child, Care Giver, Pharmacy Records  glucose blood (GNP TRUE METRIX GLUCOSE STRIPS) test strip [id number] No Use to check blood sugars twice a day  Patient not taking: Reported on 01/29/2024   Plotnikov, Karlynn GAILS, MD Not Taking Active Self, Child, Care Giver, Pharmacy Records           Med Note (Kartik Fernando M   Wed Jan 29, 2024 10:49 AM) 01/29/24: Reports during TOC call- she does not monitor blood sugars at home   latanoprost  (XALATAN ) 0.005 % ophthalmic solution [id number] Yes Place 1 drop into both eyes at bedtime.  [provider] Taking Active Self, Child, Care Giver, Pharmacy Records           Med Note BIRDIA ANTON CHRISTELLA Pablo Mar 11, 2017  8:33 AM)    Netarsudil-Latanoprost  0.02-0.005 % SOLN [id number] No Apply to eye.  Patient not taking: Reported on 01/29/2024   [provider] Not Taking Active Self, Child, Care Giver, Pharmacy Records           Med Note (Kallyn Demarcus M   Wed Jan 29, 2024 10:49 AM) 01/29/24: Reports during TOC call- she does not take   NON FORMULARY [id number] Yes Place 10 drops under the tongue daily as needed (Pain). Medication: CBD oil [provider] Taking Active Self, Child, Care Giver, Pharmacy Records           Med Note (Makyna Niehoff M   Wed Jan 29, 2024 10:50 AM) 01/29/24: Reports during TOC call- she takes CBD gummies occasionally- does not use CBD oil   pantoprazole  (PROTONIX ) 40 MG tablet [id number] Yes TAKE 1 TABLET BY MOUTH EVERY DAY Plotnikov, Aleksei V, MD Taking Active Self, Child, Care Giver, Pharmacy Records  rosuvastatin  (CRESTOR ) 5 MG tablet [id number] No Take 5 mg by mouth daily.  Patient  not taking: Reported on 01/17/2024   [provider] Unknown Active Self, Child, Care Giver, Pharmacy Records           Med Note MARLAND BEATRIS CHRISTELLA Stevan Jan 29, 2024 10:52 AM) 01/29/24: Reports during TOC call- she is unsure if she is taking/ has at her home:  reports PCS caregiver manages medications and will be coming today to clarify   senna (SENOKOT) 8.6 MG TABS tablet [id number] No Take 1 tablet (8.6 mg total) by mouth 2 (two) times daily.  Patient not taking: Reported on 01/17/2024   Dickie Begun, MD Not Taking Active Self, Child, Care Giver, Pharmacy Records           Med Note (Willliam Pettet M   Wed Jan 29, 2024 10:50 AM) 01/29/24: Reports during TOC call- she does not take   sertraline  (ZOLOFT ) 100 MG tablet [id number] No TAKE 1 TABLET BY MOUTH EVERY DAY Plotnikov, Aleksei V, MD Unknown Active Self, Child, Care Giver, Pharmacy Records  Med Note MARLAND BEATRIS HERO   Wed Jan 29, 2024 10:51 AM) 01/29/24: Reports during TOC call- she is unsure if she is taking/ has at her home:  reports PCS caregiver manages medications and will be coming today to clarify            Home Care and Equipment/Supplies: Were Home Health Services Ordered?: Yes Name of Home Health Agency:: [facility]: [phone] Has Agency set up a time to come to your home?: Yes First Home Health Visit Date: 01/29/24 (per son- they called yesterday and said someone would be calling later today with the time they are planning to come; haven't heard formm them yet) Any new equipment or medical supplies ordered?: No  Functional Questionnaire: Do you need assistance with bathing/showering or dressing?: Yes (family members/ PCS caregiver assist- supervises as indicated/ needed) Do you need assistance with meal preparation?: Yes (family members/ PCS caregiver manages meal preparation) Do you need assistance with eating?: No (family members/ PCS caregiver assist- supervises as indicated/ needed) Do you have difficulty  maintaining continence: No (family members/ PCS caregiver assist- supervises as indicated/ needed) Do you need assistance with getting out of bed/getting out of a chair/moving?: Yes (family members/ PCS caregiver assist- supervises as indicated/ needed) Do you have difficulty managing or taking your medications?: Yes (PCS caregiver and family members manage all aspects of medication management)  Follow up appointments reviewed: PCP Follow-up appointment confirmed?: No- attempted to facilitate HFU OV with scheduling care guide: scheduling care guide reports no appointments available with any provider in practice and advised that caregiver call office himself to schedule: advised caregiver of same-- he verbalizes understanding/ agreement Addendum:  verified post-signing of note- caregiver called and was able to schedule with covering provider for PCP that patient now has HFU OV scheduled for tomorrow: 01/30/24 MD Provider Line Number:[phone] Given: No (verified well-established with current PCP) Specialist Hospital Follow-up appointment confirmed?: NA Reason Specialist Follow-Up Not Confirmed: Patient has Specialist Provider Number and will Call for Appointment (discussed possible need for scheduling GI provider office visit: discharge notes indicate PCP to make decision re: need for specialist visit- encouraged son to discuss with PCP- he verbalizes agreement/ understanding) Do you need transportation to your follow-up appointment?: No Do you understand care options if your condition(s) worsen?: Yes-patient verbalized understanding  SDOH Interventions Today    Flowsheet Row Most Recent Value  SDOH Interventions   Food Insecurity Interventions Intervention Not Indicated  Housing Interventions Intervention Not Indicated  Transportation Interventions Intervention Not Indicated  [family provides transportation]  Utilities Interventions Intervention Not Indicated      Interventions Today     Flowsheet Row Most Recent Value  Chronic Disease   Chronic disease during today's visit Other  [anemia' syncope/ GI bleeding]  General Interventions   General Interventions Discussed/Reviewed General Interventions Discussed, Durable Medical Equipment (DME), Doctor Visits  Doctor Visits Discussed/Reviewed Doctor Visits Discussed, PCP, Specialist  Durable Medical Equipment (DME) Vannie lean currently requiring/ using assistive devices for ambulation - walker: provided education per son request around process to obtain new order from PCP for wheelchair]  PCP/Specialist Visits Compliance with follow-up visit  Exercise Interventions   Exercise Discussed/Reviewed Exercise Discussed  [role of home health services with importance of participation/ ongoing engagement]  Education Interventions   Education Provided Provided Education  Provided Verbal Education On Medication, When to see the doctor, Other  [Need for medication clarification post-hospital discharge from PCP,  process to obtain new DME,  signs/ symptoms GI bleeding along  with corresponding action plan]  Nutrition Interventions   Nutrition Discussed/Reviewed Nutrition Discussed  Pharmacy Interventions   Pharmacy Dicussed/Reviewed Pharmacy Topics Discussed  [Full medication review with updating medication list in EHR per patient report]  Safety Interventions   Safety Discussed/Reviewed Safety Discussed, Fall Risk       TOC Interventions Today    Flowsheet Row Most Recent Value  TOC Interventions   TOC Interventions Discussed/Reviewed TOC Interventions Discussed  [Attempted to schedule HFU OV via scheduling care guide- UNS,   Son/ pt. declined need for ongoing care management outreach,  declines enrollment in 30-day TOC program,  provided direct contact information should questions/ needs arise post-TOC call]      Total time spent from review to signing of note/ including any care coordination interventions: 80  minutes  Kadejah Sandiford Mckinney Awilda Covin, RN, BSN, Media Planner  Transitions of Care  [facility]  [place] [phone]: direct office

## 2024-01-30 ENCOUNTER — Encounter: Payer: Self-pay | Admitting: Internal Medicine

## 2024-01-30 ENCOUNTER — Encounter: Payer: Self-pay | Admitting: Emergency Medicine

## 2024-01-30 ENCOUNTER — Ambulatory Visit: Payer: Medicare PPO | Admitting: Emergency Medicine

## 2024-01-30 VITALS — BP 132/88 | HR 57 | Temp 97.9°F | Ht 62.0 in | Wt 113.1 lb

## 2024-01-30 DIAGNOSIS — I10 Essential (primary) hypertension: Secondary | ICD-10-CM

## 2024-01-30 DIAGNOSIS — Z09 Encounter for follow-up examination after completed treatment for conditions other than malignant neoplasm: Secondary | ICD-10-CM | POA: Diagnosis not present

## 2024-01-30 DIAGNOSIS — E1121 Type 2 diabetes mellitus with diabetic nephropathy: Secondary | ICD-10-CM | POA: Diagnosis not present

## 2024-01-30 DIAGNOSIS — R269 Unspecified abnormalities of gait and mobility: Secondary | ICD-10-CM

## 2024-01-30 DIAGNOSIS — D62 Acute posthemorrhagic anemia: Secondary | ICD-10-CM | POA: Diagnosis not present

## 2024-01-30 DIAGNOSIS — K5731 Diverticulosis of large intestine without perforation or abscess with bleeding: Secondary | ICD-10-CM

## 2024-01-30 DIAGNOSIS — N1832 Chronic kidney disease, stage 3b: Secondary | ICD-10-CM

## 2024-01-30 DIAGNOSIS — M7051 Other bursitis of knee, right knee: Secondary | ICD-10-CM

## 2024-01-30 DIAGNOSIS — H543 Unqualified visual loss, both eyes: Secondary | ICD-10-CM

## 2024-01-30 LAB — COMPREHENSIVE METABOLIC PANEL
ALT: 11 U/L (ref 0–35)
AST: 21 U/L (ref 0–37)
Albumin: 3.3 g/dL — ABNORMAL LOW (ref 3.5–5.2)
Alkaline Phosphatase: 66 U/L (ref 39–117)
BUN: 20 mg/dL (ref 6–23)
CO2: 25 meq/L (ref 19–32)
Calcium: 9.1 mg/dL (ref 8.4–10.5)
Chloride: 108 meq/L (ref 96–112)
Creatinine, Ser: 0.89 mg/dL (ref 0.40–1.20)
GFR: 57.08 mL/min — ABNORMAL LOW (ref >60.00)
Glucose, Bld: 123 mg/dL — ABNORMAL HIGH (ref 70–99)
Potassium: 3.7 meq/L (ref 3.5–5.1)
Sodium: 140 meq/L (ref 135–145)
Total Bilirubin: 0.4 mg/dL (ref 0.2–1.2)
Total Protein: 6.6 g/dL (ref 6.0–8.3)

## 2024-01-30 LAB — CBC WITH DIFFERENTIAL/PLATELET
Basophils Absolute: 0 10*3/uL (ref 0.0–0.1)
Basophils Relative: 0.4 % (ref 0.0–3.0)
Eosinophils Absolute: 0.1 10*3/uL (ref 0.0–0.7)
Eosinophils Relative: 2 % (ref 0.0–5.0)
HCT: 30 % — ABNORMAL LOW (ref 36.0–46.0)
Hemoglobin: 9.6 g/dL — ABNORMAL LOW (ref 12.0–15.0)
Lymphocytes Relative: 12.5 % (ref 12.0–46.0)
Lymphs Abs: 0.8 10*3/uL (ref 0.7–4.0)
MCHC: 32.1 g/dL (ref 30.0–36.0)
MCV: 90.6 fL (ref 78.0–100.0)
Monocytes Absolute: 0.5 10*3/uL (ref 0.1–1.0)
Monocytes Relative: 7.5 % (ref 3.0–12.0)
Neutro Abs: 5.2 10*3/uL (ref 1.4–7.7)
Neutrophils Relative %: 77.6 % — ABNORMAL HIGH (ref 43.0–77.0)
Platelets: 198 10*3/uL (ref 150.0–400.0)
RBC: 3.32 Mil/uL — ABNORMAL LOW (ref 3.87–5.11)
RDW: 16 % — ABNORMAL HIGH (ref 11.5–15.5)
WBC: 6.6 10*3/uL (ref 4.0–10.5)

## 2024-01-30 NOTE — Assessment & Plan Note (Signed)
 Clinically stable. No signs of bleeding today Blood work repeated Stable vital signs No concerns

## 2024-01-30 NOTE — Assessment & Plan Note (Signed)
 Well-controlled hypertension Continues amlodipine  5 mg daily

## 2024-01-30 NOTE — Assessment & Plan Note (Signed)
 Chronic condition.  Not taking metformin  at present time

## 2024-01-30 NOTE — Progress Notes (Signed)
 [address] 89 y.o.   Chief Complaint  Patient presents with   Hospitalization Follow-up    Patient needs orders for a wheel chair. needs lab work because they have not found where she is losing blood.    HISTORY OF PRESENT ILLNESS: This is a 89 y.o. female here for follow-up of recent hospital admission.  Admitted with anemia and lower GI bleed on 01/17/2024 and discharged on 01/28/2024 Accompanied by son today.  Feeling better today Discharge summary as follows:  Plan   Confirmed diverticular bleed-no concerns for small bowel bleed Colonoscopy performedUpper Endo--no overt source, probable diverticular bleed that is reoslving-deferring to GI further work-up Started to have more solid stool on 2/3 ll Hemoglobin has remained above 8 after the last transfusion stool is more formed she has not had any further reports of large amounts of bloody stool or melena-I discussed the case with gastroenterology who feels that she is as optimized as she will be for discharge and she will be going home and she may be followed up by them if felt indicated by PCP Anemia of acute blood loss secondary to bleeding from GI Transfused last 2/1--hemoglobin stable after transfusion.  Monitor trends as an outpatient Iron  18/tsat 6--rec'd IV iron  x 2  Check CBC in about a week and check anemia studies in about 1 month Syncope on admission secondary to anemia  Therapy feels that she can d/c home with a little help-will order home health PT Blood pressures ok--not orthostatic HTN HFpEF EF 1/25--ef 60-65% grade 2 DD Continue amlodipine  5 only-blood pressures have been reasonably controlled-would not resume meds as below losartan  50 has been held Aldactone  Monday Wednesday Friday held--Would not resume in the outpatient setting given advanced age etc. Hypernatremia azotemia Overall seems improved IVF stopped--has been overall stable Diabetes mellitus type 2 A1c has always been below seven 6.8 last  check Continues on sliding scale-CBG moderately controlled and never above 180 or 200 so I do not think she needs metformin  Metformin  has been held from prior to admission and may not resume given advanced age and risk of hypoglycemia May continue gabapentin  100 twice daily for neuropathy Prior CVA Aspirin  on hold At baseline blind--compensates well  HPI   Prior to Admission medications   Medication Sig Start Date End Date Taking? Authorizing Provider  acetaminophen  (TYLENOL ) 325 MG tablet Take 2 tablets (650 mg total) by mouth every 6 (six) hours as needed for moderate pain or fever. 07/29/20   Dickie Begun, MD  allopurinol  (ZYLOPRIM ) 100 MG tablet Take 0.5 tablets (50 mg total) by mouth daily. 09/11/23   Plotnikov, Karlynn GAILS, MD  amLODipine  (NORVASC ) 5 MG tablet Take 1 tablet (5 mg total) by mouth daily. 05/14/23   Plotnikov, Aleksei V, MD  atropine  1 % ophthalmic solution Place 1 drop into both eyes daily. Patient not taking: Reported on 01/29/2024 01/16/21   [provider]  brimonidine  (ALPHAGAN ) 0.2 % ophthalmic solution Place 1 drop into the right eye 2 (two) times daily. Patient not taking: Reported on 01/17/2024    [provider]  [facility] VITAMIN B12 1000 MCG tablet TAKE 1 TABLET (1,000 MCG TOTAL) BY MOUTH EVERY DAY 05/13/23   Plotnikov, Aleksei V, MD  dorzolamide -timolol  (COSOPT ) 22.3-6.8 MG/ML ophthalmic solution Place 1 drop into both eyes 2 (two) times daily.     [provider]  feeding supplement, ENSURE ENLIVE, (ENSURE ENLIVE) LIQD Take 237 mLs by mouth daily at 2 PM. 07/30/20   Dickie Begun, MD  gabapentin  (NEURONTIN ) 100 MG capsule Take 1 capsule (100 mg total) by mouth 2 (two) times daily. 08/12/23   Plotnikov, Karlynn GAILS, MD  glucose blood (GNP TRUE METRIX GLUCOSE STRIPS) test strip Use to check blood sugars twice a day Patient not taking: Reported on 01/29/2024 04/26/21   Plotnikov, Aleksei V, MD  latanoprost  (XALATAN ) 0.005 % ophthalmic solution Place 1 drop  into both eyes at bedtime.  04/26/16   [provider]  Netarsudil-Latanoprost  0.02-0.005 % SOLN Apply to eye. Patient not taking: Reported on 01/29/2024    [provider]  NON FORMULARY Place 10 drops under the tongue daily as needed (Pain). Medication: CBD oil    [provider]  pantoprazole  (PROTONIX ) 40 MG tablet TAKE 1 TABLET BY MOUTH EVERY DAY 11/14/23   Plotnikov, Aleksei V, MD  rosuvastatin  (CRESTOR ) 5 MG tablet Take 5 mg by mouth daily. Patient not taking: Reported on 01/17/2024 11/11/23   [provider]  senna (SENOKOT) 8.6 MG TABS tablet Take 1 tablet (8.6 mg total) by mouth 2 (two) times daily. Patient not taking: Reported on 01/17/2024 07/29/20   Dickie Begun, MD  sertraline  (ZOLOFT ) 100 MG tablet TAKE 1 TABLET BY MOUTH EVERY DAY 11/18/23   Plotnikov, Karlynn GAILS, MD    Allergies  Allergen Reactions   Verapamil Shortness Of Breath    REACTION: SOB   Aspirin  Itching    But tolerates low dose   Atenolol     REACTION: fatigue   Codeine Itching   Codeine Sulfate    Hydrochlorothiazide     REACTION: gout   Hydrocodone        side effects - hallucinations   Ibuprofen      Upset stomach w/high doses   Lisinopril     REACTION: cough   Onion Other (See Comments)    Dry mouth/ gets sores   Shellfish Allergy Swelling    Patient stated she does not eat shellfish, she swells up on different parts of the body   Statins     Dizzy, falls   Valsartan Itching   Adhesive  [Tape] Rash   Other Rash    Patient Active Problem List   Diagnosis Date Noted   Lower gastrointestinal bleeding 01/23/2024   Acute blood loss anemia 01/22/2024   Diverticulosis of colon with hemorrhage 01/20/2024   ABLA (acute blood loss anemia) 01/18/2024   Heme positive stool 01/18/2024   Symptomatic anemia 01/18/2024   Thrombocytopenia (HCC) 01/17/2024   Blindness of both eyes 10/17/2023   Stress    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   Headache 10/16/2023   Ankle pain,  left 06/14/2023   Leg abrasion 06/14/2023   Elevated troponin 03/01/2023   Stage 3b chronic kidney disease (CKD) (HCC) 03/01/2023   Depression 03/01/2023   Left shoulder pain 02/28/2023   Pes anserinus bursitis of both knees 05/23/2022   Gait disorder 03/07/2022   Falls frequently 11/29/2021   Vision loss 11/29/2021   Parkinsonian features 05/23/2021   Atherosclerosis of aorta (HCC) 05/23/2021   Normocytic anemia 08/18/2020   Constipation 08/18/2020   Sinus bradycardia 07/21/2020   Hip pain 07/06/2020   Acute pain of right knee 09/28/2018   Well adult exam 05/13/2018   Night sweats 04/04/2018   Breast symptom 10/30/2017   Diarrhea 08/21/2017   Lung nodule 08/21/2017   FTT (failure to thrive) in adult 08/21/2017   Shortness of breath 08/13/2017   Cough 08/13/2017   Fatigue 08/13/2017   Blurry vision 08/13/2017   Shoulder pain, right  07/25/2017   Callus of foot 06/07/2017   Need for prophylactic vaccination and inoculation against influenza 11/21/2016   Trigger finger, acquired 11/12/2016   Meteorism 07/11/2016   Dark stools 01/10/2016   Osteoporosis 07/13/2015   Cornea disorder 11/22/2014   Primary open angle glaucoma of both eyes, indeterminate stage 11/22/2014   Secondary corneal edema 10/14/2014   Swelling of left knee joint 04/07/2014   Grief 01/04/2014   Syncope 01/04/2014   Thoracic spine pain 10/22/2013   Hypernatremia 07/07/2013   Diabetic neuropathy (HCC) 07/30/2012   Neck pain on right side 07/30/2012   Cystoid macular edema 06/19/2012   History of corneal transplant 06/19/2012   PARESTHESIA 01/03/2011   Full incontinence of feces 01/01/2011   LEG PAIN, LEFT 10/02/2010   History of cardioembolic cerebrovascular accident (CVA) 03/27/2010   GERD (gastroesophageal reflux disease) 03/27/2010   CHOLELITHIASIS 03/27/2010   Cerebral artery occlusion with cerebral infarction (HCC) 03/27/2010   LOW BACK PAIN 09/07/2009   History of anemia due to chronic kidney  disease 06/14/2009   Dysphagia 05/16/2009   Dyslipidemia 11/06/2007   Hyperlipidemia 11/06/2007   DM2 (diabetes mellitus, type 2) (HCC) 07/29/2007   Gout 07/29/2007   Essential hypertension 07/29/2007   Hydronephrosis 07/29/2007   Osteoarthritis 07/29/2007    Past Medical History:  Diagnosis Date   Adrenal adenoma 12/24/2004   Boils 12/25/2007   Cholelithiasis    asympt. w/normal HIDA 03/2010   CVA (cerebral infarction) 12/24/2008   Cerebellar   Diverticulitis    Esophageal spasm 12/24/2009   GERD (gastroesophageal reflux disease)    Gout    History of colon polyps    HTN (hypertension)    Hydronephrosis    LEFT/ Surgical intervention   Hyperlipidemia    LBP (low back pain)    Osteoarthritis    Pulmonary HTN (HCC)    Stress    Type II or unspecified type diabetes mellitus without mention of complication, not stated as uncontrolled     Past Surgical History:  Procedure Laterality Date   ABDOMINAL HYSTERECTOMY     APPENDECTOMY     2020   BACK SURGERY     BREAST BIOPSY     RIGHT   CATARACT EXTRACTION, BILATERAL     COLECTOMY  2006   Sigmoid   COLONOSCOPY Left 01/20/2024   Procedure: COLONOSCOPY;  Surgeon: Abran Norleen SAILOR, MD;  Location: [facility];  Service: Gastroenterology;  Laterality: Left;   ESOPHAGOGASTRODUODENOSCOPY Left 01/19/2024   Procedure: ESOPHAGOGASTRODUODENOSCOPY (EGD);  Surgeon: San Sandor GAILS, DO;  Location: [facility];  Service: Gastroenterology;  Laterality: Left;   FOOT SURGERY     BILATERAL   HAMMER TOE SURGERY     INTRAMEDULLARY (IM) NAIL INTERTROCHANTERIC Right 07/21/2020   Procedure: INTRAMEDULLARY (IM) NAIL INTERTROCHANTRIC;  Surgeon: Beverley Evalene BIRCH, MD;  Location: [facility];  Service: Orthopedics;  Laterality: Right;   ROTATOR CUFF REPAIR  2008   RIGHT   TOTAL KNEE ARTHROPLASTY  2003   LEFT   VARICOSE VEIN SURGERY     vein stripping/lower extremities    Social History   Socioeconomic History   Marital status: Widowed    Spouse  name: Nubia Ziesmer   Number of children: 1   Years of education: Not on file   Highest education level: Not on file  Occupational History   Occupation: Retired    Associate Professor: RETIRED  Tobacco Use   Smoking status: Never   Smokeless tobacco: Never  Vaping Use   Vaping status: Never Used  Substance and Sexual Activity   Alcohol  use: No    Alcohol /week: 0.0 standard drinks of alcohol    Drug use: No   Sexual activity: Not Currently  Other Topics Concern   Not on file  Social History Narrative   Lives with son.   Social Drivers of Corporate Investment Banker Strain: Low Risk  (12/06/2023)   Overall Financial Resource Strain (CARDIA)    Difficulty of Paying Living Expenses: Not hard at all  Food Insecurity: No Food Insecurity (01/29/2024)   Hunger Vital Sign    Worried About Running Out of Food in the Last Year: Never true    Ran Out of Food in the Last Year: Never true  Transportation Needs: No Transportation Needs (01/29/2024)   PRAPARE - Administrator, Civil Service (Medical): No    Lack of Transportation (Non-Medical): No  Physical Activity: Inactive (12/06/2023)   Exercise Vital Sign    Days of Exercise per Week: 0 days    Minutes of Exercise per Session: 0 min  Stress: No Stress Concern Present (12/06/2023)   [employer] of Occupational Health - Occupational Stress Questionnaire    Feeling of Stress : Not at all  Social Connections: Moderately Integrated (01/18/2024)   Social Connection and Isolation Panel [NHANES]    Frequency of Communication with Friends and Family: Once a week    Frequency of Social Gatherings with Friends and Family: More than three times a week    Attends Religious Services: More than 4 times per year    Active Member of [employer] or Organizations: Yes    Attends Banker Meetings: Never    Marital Status: Widowed  Intimate Partner Violence: Patient Unable To Answer (01/29/2024)   Humiliation, Afraid, Rape, and Kick  questionnaire    Fear of Current or Ex-Partner: Patient unable to answer    Emotionally Abused: Patient unable to answer    Physically Abused: Patient unable to answer    Sexually Abused: Patient unable to answer    Family History  Problem Relation Age of Onset   Prostate cancer Father    Hypertension Father    Cancer Father        prostate   Heart disease Mother    Diabetes Mother    Diabetes Other        1st degree relative   Heart disease Other    Hypertension Other    Prostate cancer Maternal Uncle    Cancer Maternal Uncle        prostate   Breast cancer Daughter    Cancer Daughter        breast   Colon cancer Neg Hx      Review of Systems  Constitutional: Negative.  Negative for chills and fever.  HENT:  Negative for congestion and sore throat.   Eyes:        Patient is blind  Respiratory: Negative.  Negative for cough and shortness of breath.   Cardiovascular:  Negative for chest pain and palpitations.  Gastrointestinal:  Negative for abdominal pain, nausea and vomiting.  Genitourinary: Negative.  Negative for dysuria and hematuria.  Skin: Negative.  Negative for rash.  Neurological:  Positive for weakness. Negative for dizziness and headaches.    Today's Vitals   01/30/24 1358  BP: 132/88  Pulse: (!) 57  Temp: 97.9 F (36.6 C)  TempSrc: Oral  SpO2: 100%  Weight: 113 lb 1.5 oz (51.3 kg)  Height: 5' 2 (1.575 m)  Body mass index is 20.69 kg/m.   Physical Exam Vitals reviewed.  Constitutional:      Appearance: Normal appearance.  HENT:     Head: Normocephalic.     Mouth/Throat:     Mouth: Mucous membranes are moist.     Pharynx: Oropharynx is clear.  Cardiovascular:     Rate and Rhythm: Normal rate and regular rhythm.     Pulses: Normal pulses.     Heart sounds: Normal heart sounds.  Pulmonary:     Effort: Pulmonary effort is normal.     Breath sounds: Normal breath sounds.  Abdominal:     Palpations: Abdomen is soft.     Tenderness:  There is no abdominal tenderness.  Musculoskeletal:     Cervical back: No tenderness.     Right lower leg: No edema.     Left lower leg: No edema.  Lymphadenopathy:     Cervical: No cervical adenopathy.  Skin:    General: Skin is warm and dry.  Neurological:     Mental Status: She is alert and oriented to person, place, and time.  Psychiatric:        Mood and Affect: Mood normal.        Behavior: Behavior normal.      ASSESSMENT & PLAN: A total of 47 minutes was spent with the patient and counseling/coordination of care regarding preparing for this visit, review of most recent office visit notes, review of multiple chronic medical conditions and their management, review of all medications, review of recent hospital discharge summary, review of most recent bloodwork results, review of health maintenance items, education on nutrition, prognosis, documentation, and need for follow up.   Problem List Items Addressed This Visit       Cardiovascular and Mediastinum   Essential hypertension   Well-controlled hypertension Continues amlodipine  5 mg daily        Digestive   Diverticulosis of colon with hemorrhage   Clinically stable. No signs of bleeding today Blood work repeated Stable vital signs No concerns        Endocrine   DM2 (diabetes mellitus, type 2) (HCC)   Chronic condition.  Not taking metformin  at present time        Genitourinary   Stage 3b chronic kidney disease (CKD) (HCC)   Chronic stable condition.  Advised to stay well-hydrated        Other   ABLA (acute blood loss anemia) - Primary   Clinically stable.  No red flag signs or symptoms Stable vital signs Benign abdominal examination CBC repeated today No concerns today.      Relevant Orders   Comprehensive metabolic panel   CBC with Differential/Platelet   Other Visit Diagnoses       Hospital discharge follow-up          Patient Instructions  Health Maintenance After Age 47 After age  11, you are at a higher risk for certain long-term diseases and infections as well as injuries from falls. Falls are a major cause of broken bones and head injuries in people who are older than age 41. Getting regular preventive care can help to keep you healthy and well. Preventive care includes getting regular testing and making lifestyle changes as recommended by your health care provider. Talk with your health care provider about: Which screenings and tests you should have. A screening is a test that checks for a disease when you have no symptoms. A diet and exercise plan that is right for  you. What should I know about screenings and tests to prevent falls? Screening and testing are the best ways to find a health problem early. Early diagnosis and treatment give you the best chance of managing medical conditions that are common after age 53. Certain conditions and lifestyle choices may make you more likely to have a fall. Your health care provider may recommend: Regular vision checks. Poor vision and conditions such as cataracts can make you more likely to have a fall. If you wear glasses, make sure to get your prescription updated if your vision changes. Medicine review. Work with your health care provider to regularly review all of the medicines you are taking, including over-the-counter medicines. Ask your health care provider about any side effects that may make you more likely to have a fall. Tell your health care provider if any medicines that you take make you feel dizzy or sleepy. Strength and balance checks. Your health care provider may recommend certain tests to check your strength and balance while standing, walking, or changing positions. Foot health exam. Foot pain and numbness, as well as not wearing proper footwear, can make you more likely to have a fall. Screenings, including: Osteoporosis screening. Osteoporosis is a condition that causes the bones to get weaker and break more  easily. Blood pressure screening. Blood pressure changes and medicines to control blood pressure can make you feel dizzy. Depression screening. You may be more likely to have a fall if you have a fear of falling, feel depressed, or feel unable to do activities that you used to do. Alcohol  use screening. Using too much alcohol  can affect your balance and may make you more likely to have a fall. Follow these instructions at home: Lifestyle Do not drink alcohol  if: Your health care provider tells you not to drink. If you drink alcohol : Limit how much you have to: 0-1 drink a day for women. 0-2 drinks a day for men. Know how much alcohol  is in your drink. In the [place] one drink equals one 12 oz bottle of beer (355 mL), one 5 oz glass of wine (148 mL), or one 1 oz glass of hard liquor (44 mL). Do not use any products that contain nicotine or tobacco. These products include cigarettes, chewing tobacco, and vaping devices, such as e-cigarettes. If you need help quitting, ask your health care provider. Activity  Follow a regular exercise program to stay fit. This will help you maintain your balance. Ask your health care provider what types of exercise are appropriate for you. If you need a cane or walker, use it as recommended by your health care provider. Wear supportive shoes that have nonskid soles. Safety  Remove any tripping hazards, such as rugs, cords, and clutter. Install safety equipment such as grab bars in bathrooms and safety rails on stairs. Keep rooms and walkways well-lit. General instructions Talk with your health care provider about your risks for falling. Tell your health care provider if: You fall. Be sure to tell your health care provider about all falls, even ones that seem minor. You feel dizzy, tiredness (fatigue), or off-balance. Take over-the-counter and prescription medicines only as told by your health care provider. These include supplements. Eat a healthy diet and  maintain a healthy weight. A healthy diet includes low-fat dairy products, low-fat (lean) meats, and fiber from whole grains, beans, and lots of fruits and vegetables. Stay current with your vaccines. Schedule regular health, dental, and eye exams. Summary Having a healthy lifestyle and getting  preventive care can help to protect your health and wellness after age 14. Screening and testing are the best way to find a health problem early and help you avoid having a fall. Early diagnosis and treatment give you the best chance for managing medical conditions that are more common for people who are older than age 36. Falls are a major cause of broken bones and head injuries in people who are older than age 71. Take precautions to prevent a fall at home. Work with your health care provider to learn what changes you can make to improve your health and wellness and to prevent falls. This information is not intended to replace advice given to you by your health care provider. Make sure you discuss any questions you have with your health care provider. Document Revised: 05/01/2021 Document Reviewed: 05/01/2021 Elsevier Patient Education  2024 Elsevier Inc.    Emil Schaumann, MD [facility]

## 2024-01-30 NOTE — Patient Instructions (Signed)
 Health Maintenance After Age 88 After age 27, you are at a higher risk for certain long-term diseases and infections as well as injuries from falls. Falls are a major cause of broken bones and head injuries in people who are older than age 73. Getting regular preventive care can help to keep you healthy and well. Preventive care includes getting regular testing and making lifestyle changes as recommended by your health care provider. Talk with your health care provider about: Which screenings and tests you should have. A screening is a test that checks for a disease when you have no symptoms. A diet and exercise plan that is right for you. What should I know about screenings and tests to prevent falls? Screening and testing are the best ways to find a health problem early. Early diagnosis and treatment give you the best chance of managing medical conditions that are common after age 90. Certain conditions and lifestyle choices may make you more likely to have a fall. Your health care provider may recommend: Regular vision checks. Poor vision and conditions such as cataracts can make you more likely to have a fall. If you wear glasses, make sure to get your prescription updated if your vision changes. Medicine review. Work with your health care provider to regularly review all of the medicines you are taking, including over-the-counter medicines. Ask your health care provider about any side effects that may make you more likely to have a fall. Tell your health care provider if any medicines that you take make you feel dizzy or sleepy. Strength and balance checks. Your health care provider may recommend certain tests to check your strength and balance while standing, walking, or changing positions. Foot health exam. Foot pain and numbness, as well as not wearing proper footwear, can make you more likely to have a fall. Screenings, including: Osteoporosis screening. Osteoporosis is a condition that causes  the bones to get weaker and break more easily. Blood pressure screening. Blood pressure changes and medicines to control blood pressure can make you feel dizzy. Depression screening. You may be more likely to have a fall if you have a fear of falling, feel depressed, or feel unable to do activities that you used to do. Alcohol  use screening. Using too much alcohol  can affect your balance and may make you more likely to have a fall. Follow these instructions at home: Lifestyle Do not drink alcohol  if: Your health care provider tells you not to drink. If you drink alcohol : Limit how much you have to: 0-1 drink a day for women. 0-2 drinks a day for men. Know how much alcohol  is in your drink. In the [place] one drink equals one 12 oz bottle of beer (355 mL), one 5 oz glass of wine (148 mL), or one 1 oz glass of hard liquor (44 mL). Do not use any products that contain nicotine or tobacco. These products include cigarettes, chewing tobacco, and vaping devices, such as e-cigarettes. If you need help quitting, ask your health care provider. Activity  Follow a regular exercise program to stay fit. This will help you maintain your balance. Ask your health care provider what types of exercise are appropriate for you. If you need a cane or walker, use it as recommended by your health care provider. Wear supportive shoes that have nonskid soles. Safety  Remove any tripping hazards, such as rugs, cords, and clutter. Install safety equipment such as grab bars in bathrooms and safety rails on stairs. Keep rooms and walkways  well-lit. General instructions Talk with your health care provider about your risks for falling. Tell your health care provider if: You fall. Be sure to tell your health care provider about all falls, even ones that seem minor. You feel dizzy, tiredness (fatigue), or off-balance. Take over-the-counter and prescription medicines only as told by your health care provider. These include  supplements. Eat a healthy diet and maintain a healthy weight. A healthy diet includes low-fat dairy products, low-fat (lean) meats, and fiber from whole grains, beans, and lots of fruits and vegetables. Stay current with your vaccines. Schedule regular health, dental, and eye exams. Summary Having a healthy lifestyle and getting preventive care can help to protect your health and wellness after age 15. Screening and testing are the best way to find a health problem early and help you avoid having a fall. Early diagnosis and treatment give you the best chance for managing medical conditions that are more common for people who are older than age 42. Falls are a major cause of broken bones and head injuries in people who are older than age 64. Take precautions to prevent a fall at home. Work with your health care provider to learn what changes you can make to improve your health and wellness and to prevent falls. This information is not intended to replace advice given to you by your health care provider. Make sure you discuss any questions you have with your health care provider. Document Revised: 05/01/2021 Document Reviewed: 05/01/2021 Elsevier Patient Education  2024 [employer].

## 2024-01-30 NOTE — Assessment & Plan Note (Addendum)
 Chronic stable condition.  Advised to stay well-hydrated

## 2024-01-30 NOTE — Assessment & Plan Note (Signed)
 Clinically stable.  No red flag signs or symptoms Stable vital signs Benign abdominal examination CBC repeated today No concerns today.

## 2024-01-31 ENCOUNTER — Telehealth: Payer: Self-pay | Admitting: Internal Medicine

## 2024-01-31 NOTE — Telephone Encounter (Signed)
 Copied from CRM [id number]. Topic: Clinical - Home Health Verbal Orders >> Jan 31, 2024 11:51 AM Viola FALCON wrote: Caller/Agency: [facility]  Callback Number: [phone] Service Requested: Occupational Therapy and Physical Therapy Frequency:  Any new concerns about the patient? The son has requested delay in services until 02/02/24

## 2024-02-02 DIAGNOSIS — L89151 Pressure ulcer of sacral region, stage 1: Secondary | ICD-10-CM | POA: Diagnosis not present

## 2024-02-02 DIAGNOSIS — I5032 Chronic diastolic (congestive) heart failure: Secondary | ICD-10-CM | POA: Diagnosis not present

## 2024-02-02 DIAGNOSIS — D120 Benign neoplasm of cecum: Secondary | ICD-10-CM | POA: Diagnosis not present

## 2024-02-02 DIAGNOSIS — D630 Anemia in neoplastic disease: Secondary | ICD-10-CM | POA: Diagnosis not present

## 2024-02-02 DIAGNOSIS — I130 Hypertensive heart and chronic kidney disease with heart failure and stage 1 through stage 4 chronic kidney disease, or unspecified chronic kidney disease: Secondary | ICD-10-CM | POA: Diagnosis not present

## 2024-02-02 DIAGNOSIS — D62 Acute posthemorrhagic anemia: Secondary | ICD-10-CM | POA: Diagnosis not present

## 2024-02-02 DIAGNOSIS — K5730 Diverticulosis of large intestine without perforation or abscess without bleeding: Secondary | ICD-10-CM | POA: Diagnosis not present

## 2024-02-02 DIAGNOSIS — K317 Polyp of stomach and duodenum: Secondary | ICD-10-CM | POA: Diagnosis not present

## 2024-02-02 DIAGNOSIS — E870 Hyperosmolality and hypernatremia: Secondary | ICD-10-CM | POA: Diagnosis not present

## 2024-02-02 NOTE — Telephone Encounter (Signed)
 Okay. Thank you.

## 2024-02-03 NOTE — Telephone Encounter (Signed)
 [facility] Callback Number: [phone] Service Requested: Occupational Therapy and Physical Therapy Frequency: Any new concerns about the patient? The son has requested delay in services until 02/02/24   I was bale to give the Verbal ok for the above.

## 2024-02-04 DIAGNOSIS — K5730 Diverticulosis of large intestine without perforation or abscess without bleeding: Secondary | ICD-10-CM | POA: Diagnosis not present

## 2024-02-04 DIAGNOSIS — L89151 Pressure ulcer of sacral region, stage 1: Secondary | ICD-10-CM | POA: Diagnosis not present

## 2024-02-04 DIAGNOSIS — E870 Hyperosmolality and hypernatremia: Secondary | ICD-10-CM | POA: Diagnosis not present

## 2024-02-04 DIAGNOSIS — D62 Acute posthemorrhagic anemia: Secondary | ICD-10-CM | POA: Diagnosis not present

## 2024-02-04 DIAGNOSIS — D630 Anemia in neoplastic disease: Secondary | ICD-10-CM | POA: Diagnosis not present

## 2024-02-04 DIAGNOSIS — I130 Hypertensive heart and chronic kidney disease with heart failure and stage 1 through stage 4 chronic kidney disease, or unspecified chronic kidney disease: Secondary | ICD-10-CM | POA: Diagnosis not present

## 2024-02-04 DIAGNOSIS — I5032 Chronic diastolic (congestive) heart failure: Secondary | ICD-10-CM | POA: Diagnosis not present

## 2024-02-04 DIAGNOSIS — D120 Benign neoplasm of cecum: Secondary | ICD-10-CM | POA: Diagnosis not present

## 2024-02-04 DIAGNOSIS — K317 Polyp of stomach and duodenum: Secondary | ICD-10-CM | POA: Diagnosis not present

## 2024-02-04 NOTE — Telephone Encounter (Signed)
DME has bee printed, signed by PCP and faxed to [facility].

## 2024-02-06 DIAGNOSIS — D630 Anemia in neoplastic disease: Secondary | ICD-10-CM | POA: Diagnosis not present

## 2024-02-06 DIAGNOSIS — L89151 Pressure ulcer of sacral region, stage 1: Secondary | ICD-10-CM | POA: Diagnosis not present

## 2024-02-06 DIAGNOSIS — I130 Hypertensive heart and chronic kidney disease with heart failure and stage 1 through stage 4 chronic kidney disease, or unspecified chronic kidney disease: Secondary | ICD-10-CM | POA: Diagnosis not present

## 2024-02-06 DIAGNOSIS — K317 Polyp of stomach and duodenum: Secondary | ICD-10-CM | POA: Diagnosis not present

## 2024-02-06 DIAGNOSIS — E870 Hyperosmolality and hypernatremia: Secondary | ICD-10-CM | POA: Diagnosis not present

## 2024-02-06 DIAGNOSIS — I5032 Chronic diastolic (congestive) heart failure: Secondary | ICD-10-CM | POA: Diagnosis not present

## 2024-02-06 DIAGNOSIS — D62 Acute posthemorrhagic anemia: Secondary | ICD-10-CM | POA: Diagnosis not present

## 2024-02-06 DIAGNOSIS — K5730 Diverticulosis of large intestine without perforation or abscess without bleeding: Secondary | ICD-10-CM | POA: Diagnosis not present

## 2024-02-06 DIAGNOSIS — D120 Benign neoplasm of cecum: Secondary | ICD-10-CM | POA: Diagnosis not present

## 2024-02-11 ENCOUNTER — Ambulatory Visit: Payer: Self-pay | Admitting: Internal Medicine

## 2024-02-11 ENCOUNTER — Ambulatory Visit: Payer: Medicare PPO | Admitting: Licensed Clinical Social Worker

## 2024-02-11 DIAGNOSIS — D120 Benign neoplasm of cecum: Secondary | ICD-10-CM | POA: Diagnosis not present

## 2024-02-11 DIAGNOSIS — L89151 Pressure ulcer of sacral region, stage 1: Secondary | ICD-10-CM | POA: Diagnosis not present

## 2024-02-11 DIAGNOSIS — I130 Hypertensive heart and chronic kidney disease with heart failure and stage 1 through stage 4 chronic kidney disease, or unspecified chronic kidney disease: Secondary | ICD-10-CM | POA: Diagnosis not present

## 2024-02-11 DIAGNOSIS — K317 Polyp of stomach and duodenum: Secondary | ICD-10-CM | POA: Diagnosis not present

## 2024-02-11 DIAGNOSIS — E870 Hyperosmolality and hypernatremia: Secondary | ICD-10-CM | POA: Diagnosis not present

## 2024-02-11 DIAGNOSIS — D62 Acute posthemorrhagic anemia: Secondary | ICD-10-CM | POA: Diagnosis not present

## 2024-02-11 DIAGNOSIS — I5032 Chronic diastolic (congestive) heart failure: Secondary | ICD-10-CM | POA: Diagnosis not present

## 2024-02-11 DIAGNOSIS — D630 Anemia in neoplastic disease: Secondary | ICD-10-CM | POA: Diagnosis not present

## 2024-02-11 DIAGNOSIS — K5730 Diverticulosis of large intestine without perforation or abscess without bleeding: Secondary | ICD-10-CM | POA: Diagnosis not present

## 2024-02-11 NOTE — Telephone Encounter (Signed)
Copied from CRM [id number]. Topic: Clinical - Red Word Triage >> Feb 11, 2024 10:19 AM Larwance Sachs wrote: Red Word that prompted transfer to Nurse Triage: Patient representative called in regard bed sore on patients bottom from hospital stay. States it is not healing properly and patient is diabetic, is concerned regarding discomfort  Chief Complaint: bed sore on buttocks Symptoms: wound, pain, drainage, currently has home health nurse doing wound care Frequency: constant Pertinent Negatives: Patient denies fever, weakness Disposition: [] ED /[] Urgent Care (no appt availability in office) / [] Appointment(In office/virtual)/ []  [facility]/ [] Home Care/ [] Refused Recommended Disposition /[] [facility]/ [x]  Follow-up with PCP Additional Notes: Has apt on Friday but home health nurse stated yesterday that wound looks worse.  Son has noticed that patient is uncomfortable and in pain, wound is not healing and would like to get an earlier apt than this Friday or a consult to wound clinic.  PCP office updated.   Reason for Disposition  [1] Wound > 48 hours old AND [2] becoming more painful or tender to touch  Answer Assessment - Initial Assessment Questions 1. LOCATION: "Where is the wound located?"      "Crack of butt" open area; was in hospital and developed a bed sore 2. WOUND APPEARANCE: "What does the wound look like?"      "Home health nurse states yesterday it looks worse." 3. SIZE: If redness is present, ask: "What is the size of the red area?" (Inches, centimeters, or compare to size of a coin)      Size of a quarter 4. SPREAD: "What's changed in the last day?"  "Do you see any red streaks coming from the wound?"     Nurse did not say what looked worse about it. 5. ONSET: "When did it start to look infected?"      No information  6. MECHANISM: "How did the wound start, what was the cause?"     Bed sore 7. PAIN: Do you have any pain?"  If Yes, ask: "How bad is the  pain?"  (e.g., Scale 1-10; mild, moderate, or severe)    - MILD (1-3): Doesn't interfere with normal activities.     - MODERATE (4-7): Interferes with normal activities or awakens from sleep.    - SEVERE (8-10): Excruciating pain, unable to do any normal activities.       Patient is in pain, per son 8. FEVER: "Do you have a fever?" If Yes, ask: "What is your temperature, how was it measured, and when did it start?"     denies 9. OTHER SYMPTOMS: "Do you have any other symptoms?" (e.g., shaking chills, weakness, rash elsewhere on body)     denies 10. PREGNANCY: "Is there any chance you are pregnant?" "When was your last menstrual period?"       na  Protocols used: Wound Infection Suspected-A-AH

## 2024-02-12 ENCOUNTER — Ambulatory Visit: Payer: Medicare PPO | Admitting: Licensed Clinical Social Worker

## 2024-02-12 NOTE — Telephone Encounter (Signed)
Again he is send made picture of the wound/pressure sore via MyChart?    They can use a DuoDERM patch over the wound. Thx

## 2024-02-12 NOTE — Telephone Encounter (Signed)
Spoke with pts son Amada Jupiter and was able to inform him of providers request for photos of the bed sore on pt buttocks for further eval. Pts son stated he will contact his other brother so they can send the photos.

## 2024-02-14 ENCOUNTER — Ambulatory Visit: Payer: Medicare PPO | Admitting: Internal Medicine

## 2024-02-14 ENCOUNTER — Encounter: Payer: Self-pay | Admitting: Internal Medicine

## 2024-02-14 VITALS — BP 108/68 | HR 53 | Temp 98.0°F | Ht 62.0 in | Wt 107.0 lb

## 2024-02-14 DIAGNOSIS — K5730 Diverticulosis of large intestine without perforation or abscess without bleeding: Secondary | ICD-10-CM | POA: Diagnosis not present

## 2024-02-14 DIAGNOSIS — D630 Anemia in neoplastic disease: Secondary | ICD-10-CM | POA: Diagnosis not present

## 2024-02-14 DIAGNOSIS — L89152 Pressure ulcer of sacral region, stage 2: Secondary | ICD-10-CM

## 2024-02-14 DIAGNOSIS — R634 Abnormal weight loss: Secondary | ICD-10-CM

## 2024-02-14 DIAGNOSIS — K5901 Slow transit constipation: Secondary | ICD-10-CM

## 2024-02-14 DIAGNOSIS — E870 Hyperosmolality and hypernatremia: Secondary | ICD-10-CM | POA: Diagnosis not present

## 2024-02-14 DIAGNOSIS — D62 Acute posthemorrhagic anemia: Secondary | ICD-10-CM | POA: Diagnosis not present

## 2024-02-14 DIAGNOSIS — D120 Benign neoplasm of cecum: Secondary | ICD-10-CM | POA: Diagnosis not present

## 2024-02-14 DIAGNOSIS — I5032 Chronic diastolic (congestive) heart failure: Secondary | ICD-10-CM | POA: Diagnosis not present

## 2024-02-14 DIAGNOSIS — Z7984 Long term (current) use of oral hypoglycemic drugs: Secondary | ICD-10-CM

## 2024-02-14 DIAGNOSIS — L8990 Pressure ulcer of unspecified site, unspecified stage: Secondary | ICD-10-CM | POA: Insufficient documentation

## 2024-02-14 DIAGNOSIS — L89151 Pressure ulcer of sacral region, stage 1: Secondary | ICD-10-CM | POA: Diagnosis not present

## 2024-02-14 DIAGNOSIS — K317 Polyp of stomach and duodenum: Secondary | ICD-10-CM | POA: Diagnosis not present

## 2024-02-14 DIAGNOSIS — E1121 Type 2 diabetes mellitus with diabetic nephropathy: Secondary | ICD-10-CM | POA: Diagnosis not present

## 2024-02-14 DIAGNOSIS — I130 Hypertensive heart and chronic kidney disease with heart failure and stage 1 through stage 4 chronic kidney disease, or unspecified chronic kidney disease: Secondary | ICD-10-CM | POA: Diagnosis not present

## 2024-02-14 NOTE — Patient Instructions (Addendum)
Wedge pillow - [employer]   For constipation:  Miralax 1 scoop once or twice a day Colace 1 twice a day Prunes  Kids vitamins - gummies

## 2024-02-14 NOTE — Assessment & Plan Note (Signed)
Docusate  Miralax

## 2024-02-14 NOTE — Progress Notes (Signed)
 Subjective:  Patient ID: Breanna [name], female    DOB: 1931-03-13  Age: 89 y.o. MRN: [id number]  CC: Medical Management of Chronic Issues (2 week f/u constipation is reoccurring, bed sore, BP readings are elevated.. Bed sore wound seems to have gotten bigger and was advise to use border foam dressing, medi honey, zinc skin barrier, Aqua AQ. B/L feet swelling due to BP. Groin pain/pressure before BM that has gone away once she had a BM.)   HPI [facility] presents for 2 week f/u constipation is reoccurring, bed sore, BP readings are elevated.. Bed sore wound seems to have gotten bigger and was advise to use border foam dressing, medi honey, zinc skin barrier, Aqua AQ. B/L feet swelling due to BP. Groin pain/pressure before BM that has gone away once she had a BM.  Here w/[name] and Jeannie   "Admit date: 01/17/2024 Discharge date: 01/28/2024   Time spent: 27 minutes   Recommendations for Outpatient Follow-up:  Needs Chem-12 CBC 1 week Outpatient follow-up with GI as per them Recommend iron studies in about 1 month and may need to be set up for IV iron infusions Recommend titration of blood pressure meds this hospital stay discontinued several such as losartan Aldactone as below and is not a candidate for any further nonsteroidals or aspirin for preventative measures   Discharge Diagnoses:  MAIN problem for hospitalization    Diverticular bleed   Please see below for itemized issues addressed in HOpsital- refer to other progress notes for clarity if needed   Discharge Condition: Improved   Diet recommendation: Regular        Filed Weights    01/25/24 0500 01/27/24 0500 01/28/24 0500  Weight: 48 kg 48 kg 48.7 kg      History of present illness:  89 year old female Previous history left shoulder rotator cuff injury followed with Dr. 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 Last EGD 04/28/2010 = presbyesophagus dilated with 54 French Maloney multiple gastric polyps, gout stage IIIb CKD-at baseline is blind 9 mm hyperdense left superior pole kidney lesion   Events 01/17/2024 admission several days malaise chills lightheadedness on standing brief LOC with assisted fall--- found to have gross blood on DRE aspirin was held transfused 1 unit GI consulted Sodium 141 potassium 3.9 BUN/creatinine 61/1.1 (usually creatinine less than 1.3) Hemoglobin baseline October 2024 10-7.3 on arrival, platelet 105, WBC 8.4, respiratory viral panel    UA negative   procedures 1/25 transfused 1/26 upper endoscopy normal esophagus multiple polyps normal mucosa no areas of active bleeding 1/27 colonoscopy diverticulosis entire examined colon-5 mm cecal polyp not removed 1/29 transfused 1 U prbc again 2/1 transfuse 1 unit PRBC       Plan   Confirmed diverticular bleed-no concerns for small bowel bleed Colonoscopy performedUpper Endo--no overt source, probable diverticular bleed that is reoslving-deferring to GI further work-up Started to have more solid stool on 2/3 ll Hemoglobin has remained above 8 after the last transfusion stool is more formed she has not had any further reports of large amounts of bloody stool or melena-I discussed the case with gastroenterology who feels that she is as optimized as she will be for discharge and she will be going home and she may be followed up by them if felt indicated by PCP Anemia of acute blood loss secondary to bleeding from GI Transfused last 2/1--hemoglobin stable after transfusion.  Monitor  trends as an outpatient Iron 18/tsat 6--rec'd IV iron x 2  Check CBC in about a week and check anemia studies in about 1 month Syncope on admission secondary to anemia  Therapy feels that she can d/c home with a little help-will order home health PT Blood pressures ok--not orthostatic HTN HFpEF EF 1/25--ef 60-65% grade 2 DD Continue  amlodipine 5 only-blood pressures have been reasonably controlled-would not resume meds as below losartan 50 has been held Aldactone Monday Wednesday Friday held--Would not resume in the outpatient setting given advanced age etc. Hypernatremia azotemia Overall seems improved IVF stopped--has been overall stable Diabetes mellitus type 2 A1c has always been below seven 6.8 last check Continues on sliding scale-CBG moderately controlled and never above 180 or 200 so I do not think she needs metformin Metformin has been held from prior to admission and may not resume given advanced age and risk of hypoglycemia May continue gabapentin 100 twice daily for neuropathy Prior CVA Aspirin on hold At baseline blind--compensates well     Discharge Exam:     Vitals:    01/27/24 2004 01/28/24 0450  BP: (!) 144/66 (!) 164/69  Pulse: (!) 59 (!) 52  Resp: 16 17  Temp:   98.4 F (36.9 C)  SpO2: 100% 100%      Subj on day of d/c   In good spirits sitting in the bed No complaints Seems to be eating and drinking okay   General Exam on discharge   EOMI NCAT no focal deficit no icterus no pallor No wheeze rales rhonchi ROM intact Power 5/5 S1-S2 no murmur"    Outpatient Medications Prior to Visit  Medication Sig Dispense Refill   acetaminophen (TYLENOL) 325 MG tablet Take 2 tablets (650 mg total) by mouth every 6 (six) hours as needed for moderate pain or fever.     allopurinol (ZYLOPRIM) 100 MG tablet Take 0.5 tablets (50 mg total) by mouth daily. 90 tablet 1   atropine 1 % ophthalmic solution Place 1 drop into both eyes daily.     [employer] VITAMIN B12 1000 MCG tablet TAKE 1 TABLET (1,000 MCG TOTAL) BY MOUTH EVERY DAY 90 tablet 3   dorzolamide-timolol (COSOPT) 22.3-6.8 MG/ML ophthalmic solution Place 1 drop into both eyes 2 (two) times daily.      feeding supplement, ENSURE ENLIVE, (ENSURE ENLIVE) LIQD Take 237 mLs by mouth daily at 2 PM. 237 mL 12   gabapentin (NEURONTIN) 100 MG capsule Take 1  capsule (100 mg total) by mouth 2 (two) times daily. 180 capsule 5   latanoprost (XALATAN) 0.005 % ophthalmic solution Place 1 drop into both eyes at bedtime.      NON FORMULARY Place 10 drops under the tongue daily as needed (Pain). Medication: CBD oil     pantoprazole (PROTONIX) 40 MG tablet TAKE 1 TABLET BY MOUTH EVERY DAY 90 tablet 3   sertraline (ZOLOFT) 100 MG tablet TAKE 1 TABLET BY MOUTH EVERY DAY 90 tablet 1   amLODipine (NORVASC) 5 MG tablet Take 1 tablet (5 mg total) by mouth daily. 90 tablet 3   brimonidine (ALPHAGAN) 0.2 % ophthalmic solution Place 1 drop into the right eye 2 (two) times daily. (Patient not taking: Reported on 01/17/2024)     glucose blood (GNP TRUE METRIX GLUCOSE STRIPS) test strip Use to check blood sugars twice a day (Patient not taking: Reported on 01/29/2024) 200 each 2   Netarsudil-Latanoprost 0.02-0.005 % SOLN Apply to eye. (Patient not taking: Reported on 01/29/2024)  rosuvastatin (CRESTOR) 5 MG tablet Take 5 mg by mouth daily. (Patient not taking: Reported on 01/17/2024)     senna (SENOKOT) 8.6 MG TABS tablet Take 1 tablet (8.6 mg total) by mouth 2 (two) times daily. (Patient not taking: Reported on 01/17/2024) 120 tablet 0   Facility-Administered Medications Prior to Visit  Medication Dose Route Frequency Provider Last Rate Last Admin   regadenoson (LEXISCAN) injection SOLN 0.4 mg  0.4 mg Intravenous Once Meriam Sprague, MD       technetium tetrofosmin (TC-MYOVIEW) injection 31.1 millicurie  31.1 millicurie Intravenous Once PRN Meriam Sprague, MD        ROS: Review of Systems  Objective:  BP 108/68   Pulse (!) 53   Temp 98 F (36.7 C) (Oral)   Ht 5\' 2"  (1.575 m)   Wt 107 lb (48.5 kg)   SpO2 99%   BMI 19.57 kg/m   BP Readings from Last 3 Encounters:  02/26/24 124/64  02/14/24 108/68  01/30/24 132/88    Wt Readings from Last 3 Encounters:  02/14/24 107 lb (48.5 kg)  01/30/24 113 lb 1.5 oz (51.3 kg)  01/28/24 107 lb 5.8 oz (48.7  kg)    Physical Exam  Lab Results  Component Value Date   WBC 6.6 01/30/2024   HGB 9.6 (L) 01/30/2024   HCT 30.0 (L) 01/30/2024   PLT 198.0 01/30/2024   GLUCOSE 123 (H) 01/30/2024   CHOL 127 03/01/2023   TRIG 42 03/01/2023   HDL 53 03/01/2023   LDLCALC 66 03/01/2023   ALT 11 01/30/2024   AST 21 01/30/2024   NA 140 01/30/2024   K 3.7 01/30/2024   CL 108 01/30/2024   CREATININE 0.89 01/30/2024   BUN 20 01/30/2024   CO2 25 01/30/2024   TSH 0.54 09/11/2023   HGBA1C 6.8 (H) 09/11/2023    ECHOCARDIOGRAM COMPLETE Result Date: 01/18/2024    ECHOCARDIOGRAM REPORT   Patient Name:   JARELI HIGHLAND Date of Exam: 01/18/2024 Medical Rec #:  [id number]       Height:       62.0 in Accession #:    [id number]      Weight:       113.0 lb Date of Birth:  1931/03/24        BSA:          1.500 m Patient Age:    89 years        BP:           173/89 mmHg Patient Gender: F               HR:           55 bpm. Exam Location:  Inpatient Procedure: 2D Echo, Cardiac Doppler and Color Doppler Indications:    Syncope  History:        Patient has prior history of Echocardiogram examinations, most                 recent 03/01/2023. Arrythmias:Bradycardia; Risk                 Factors:Hypertension and Diabetes.  Sonographer:    Webb Laws Referring Phys: [id number] TIMOTHY S OPYD IMPRESSIONS  1. Left ventricular ejection fraction, by estimation, is 60 to 65%. The left ventricle has normal function. The left ventricle has no regional wall motion abnormalities. There is moderate left ventricular hypertrophy. Left ventricular diastolic parameters are consistent with Grade II diastolic dysfunction (pseudonormalization). Elevated left atrial pressure. The  average left ventricular global longitudinal strain is -20.3 %.  2. Right ventricular systolic function is normal. The right ventricular size is normal. There is moderately elevated pulmonary artery systolic pressure. The estimated right ventricular systolic pressure is 47.4  mmHg.  3. Left atrial size was severely dilated.  4. The mitral valve is degenerative. Moderate mitral valve regurgitation.  5. Tricuspid valve regurgitation is mild to moderate.  6. The aortic valve is tricuspid. Aortic valve regurgitation is trivial. Aortic valve sclerosis is present, with no evidence of aortic valve stenosis.  7. The inferior vena cava is normal in size with greater than 50% respiratory variability, suggesting right atrial pressure of 3 mmHg. FINDINGS  Left Ventricle: Left ventricular ejection fraction, by estimation, is 60 to 65%. The left ventricle has normal function. The left ventricle has no regional wall motion abnormalities. The average left ventricular global longitudinal strain is -20.3 %. The left ventricular internal cavity size was normal in size. There is moderate left ventricular hypertrophy. Left ventricular diastolic parameters are consistent with Grade II diastolic dysfunction (pseudonormalization). Elevated left atrial pressure. Right Ventricle: The right ventricular size is normal. No increase in right ventricular wall thickness. Right ventricular systolic function is normal. There is moderately elevated pulmonary artery systolic pressure. The tricuspid regurgitant velocity is 3.33 m/s, and with an assumed right atrial pressure of 3 mmHg, the estimated right ventricular systolic pressure is 47.4 mmHg. Left Atrium: Left atrial size was severely dilated. Right Atrium: Right atrial size was normal in size. Pericardium: There is no evidence of pericardial effusion. Mitral Valve: The mitral valve is degenerative in appearance. Moderate mitral valve regurgitation. Tricuspid Valve: The tricuspid valve is normal in structure. Tricuspid valve regurgitation is mild to moderate. Aortic Valve: The aortic valve is tricuspid. Aortic valve regurgitation is trivial. Aortic regurgitation PHT measures 654 msec. Aortic valve sclerosis is present, with no evidence of aortic valve stenosis.  Pulmonic Valve: The pulmonic valve was not well visualized. Pulmonic valve regurgitation is not visualized. Aorta: The aortic root and ascending aorta are structurally normal, with no evidence of dilitation. Venous: The inferior vena cava is normal in size with greater than 50% respiratory variability, suggesting right atrial pressure of 3 mmHg. IAS/Shunts: The interatrial septum was not well visualized.  LEFT VENTRICLE PLAX 2D LVIDd:         3.80 cm     Diastology LVIDs:         2.40 cm     LV e' medial:    3.92 cm/s LV PW:         1.60 cm     LV E/e' medial:  18.6 LV IVS:        1.60 cm     LV e' lateral:   4.46 cm/s LVOT diam:     2.00 cm     LV E/e' lateral: 16.4 LV SV:         64 LV SV Index:   43          2D Longitudinal Strain LVOT Area:     3.14 cm    2D Strain GLS Avg:     -20.3 %  LV Volumes (MOD) LV vol d, MOD A2C: 54.4 ml LV vol d, MOD A4C: 68.2 ml LV vol s, MOD A2C: 21.8 ml LV vol s, MOD A4C: 23.4 ml LV SV MOD A2C:     32.6 ml LV SV MOD A4C:     68.2 ml LV SV MOD BP:  38.2 ml RIGHT VENTRICLE             IVC RV Basal diam:  3.00 cm     IVC diam: 1.50 cm RV S prime:     14.50 cm/s TAPSE (M-mode): 2.9 cm LEFT ATRIUM              Index        RIGHT ATRIUM           Index LA diam:        4.70 cm  3.13 cm/m   RA Area:     10.60 cm LA Vol (A2C):   98.4 ml  65.61 ml/m  RA Volume:   21.30 ml  14.20 ml/m LA Vol (A4C):   120.0 ml 80.01 ml/m LA Biplane Vol: 116.0 ml 77.34 ml/m  AORTIC VALVE LVOT Vmax:   97.30 cm/s LVOT Vmean:  68.000 cm/s LVOT VTI:    0.203 m AI PHT:      654 msec  AORTA Ao Root diam: 3.10 cm Ao Asc diam:  3.20 cm MITRAL VALVE                  TRICUSPID VALVE MV Area (PHT): 3.65 cm       TR Peak grad:   44.4 mmHg MV Decel Time: 208 msec       TR Vmax:        333.00 cm/s MR Peak grad:    169.5 mmHg MR Mean grad:    108.0 mmHg   SHUNTS MR Vmax:         651.00 cm/s  Systemic VTI:  0.20 m MR Vmean:        495.0 cm/s   Systemic Diam: 2.00 cm MR PISA:         1.01 cm MR PISA Eff ROA: 6 mm  MR PISA Radius:  0.40 cm MV E velocity: 73.10 cm/s MV A velocity: 120.00 cm/s MV E/A ratio:  0.61 Epifanio Lesches MD Electronically signed by Epifanio Lesches MD Signature Date/Time: 01/18/2024/1:10:41 PM    Final    DG Chest Portable 1 View Result Date: 01/17/2024 CLINICAL DATA:  Fever.  Vomiting and diarrhea for 3 days.  Syncope EXAM: PORTABLE CHEST 1 VIEW COMPARISON:  X-ray 02/28/2023. FINDINGS: Hyperinflation. No consolidation, pneumothorax or effusion. No edema. Enlarged cardiopericardial silhouette tortuous aorta, similar to previous. Osteopenia. IMPRESSION: Hyperinflation.  Enlarged heart.  No consolidation. Electronically Signed   By: Karen Kays M.D.   On: 01/17/2024 17:50    Assessment & Plan:   Problem List Items Addressed This Visit     DM2 (diabetes mellitus, type 2) (HCC)   Chronic On Metformin      Relevant Orders   CBC with Differential/Platelet   Comprehensive metabolic panel   Hemoglobin A1c   TSH   Weight loss   Some wt loss      Relevant Orders   CBC with Differential/Platelet   Comprehensive metabolic panel   Hemoglobin A1c   TSH   Constipation   Docusate/Miralax      Relevant Orders   CBC with Differential/Platelet   Comprehensive metabolic panel   Hemoglobin A1c   TSH   Pressure sore - Primary   L sacrum 2.5x1.5 cm Wedge pilow  Duoderm      Relevant Orders   CBC with Differential/Platelet   Comprehensive metabolic panel   Hemoglobin A1c   TSH      No orders of the defined types were placed in this encounter.  Follow-up: Return in about 6 weeks (around 03/27/2024) for a follow-up visit.  Sonda Primes, MD

## 2024-02-14 NOTE — Assessment & Plan Note (Signed)
Chronic On Metformin 

## 2024-02-14 NOTE — Assessment & Plan Note (Signed)
 Some wt loss.

## 2024-02-14 NOTE — Assessment & Plan Note (Signed)
L sacrum 2.5x1.5 cm Wedge pilow  Duoderm

## 2024-02-18 DIAGNOSIS — I5032 Chronic diastolic (congestive) heart failure: Secondary | ICD-10-CM | POA: Diagnosis not present

## 2024-02-18 DIAGNOSIS — D120 Benign neoplasm of cecum: Secondary | ICD-10-CM | POA: Diagnosis not present

## 2024-02-18 DIAGNOSIS — K5730 Diverticulosis of large intestine without perforation or abscess without bleeding: Secondary | ICD-10-CM | POA: Diagnosis not present

## 2024-02-18 DIAGNOSIS — D62 Acute posthemorrhagic anemia: Secondary | ICD-10-CM | POA: Diagnosis not present

## 2024-02-18 DIAGNOSIS — L89151 Pressure ulcer of sacral region, stage 1: Secondary | ICD-10-CM | POA: Diagnosis not present

## 2024-02-18 DIAGNOSIS — E870 Hyperosmolality and hypernatremia: Secondary | ICD-10-CM | POA: Diagnosis not present

## 2024-02-18 DIAGNOSIS — I130 Hypertensive heart and chronic kidney disease with heart failure and stage 1 through stage 4 chronic kidney disease, or unspecified chronic kidney disease: Secondary | ICD-10-CM | POA: Diagnosis not present

## 2024-02-18 DIAGNOSIS — D630 Anemia in neoplastic disease: Secondary | ICD-10-CM | POA: Diagnosis not present

## 2024-02-18 DIAGNOSIS — K317 Polyp of stomach and duodenum: Secondary | ICD-10-CM | POA: Diagnosis not present

## 2024-02-19 ENCOUNTER — Encounter: Payer: Self-pay | Admitting: Internal Medicine

## 2024-02-21 DIAGNOSIS — K317 Polyp of stomach and duodenum: Secondary | ICD-10-CM | POA: Diagnosis not present

## 2024-02-21 DIAGNOSIS — K5730 Diverticulosis of large intestine without perforation or abscess without bleeding: Secondary | ICD-10-CM | POA: Diagnosis not present

## 2024-02-21 DIAGNOSIS — D630 Anemia in neoplastic disease: Secondary | ICD-10-CM | POA: Diagnosis not present

## 2024-02-21 DIAGNOSIS — I130 Hypertensive heart and chronic kidney disease with heart failure and stage 1 through stage 4 chronic kidney disease, or unspecified chronic kidney disease: Secondary | ICD-10-CM | POA: Diagnosis not present

## 2024-02-21 DIAGNOSIS — E870 Hyperosmolality and hypernatremia: Secondary | ICD-10-CM | POA: Diagnosis not present

## 2024-02-21 DIAGNOSIS — D120 Benign neoplasm of cecum: Secondary | ICD-10-CM | POA: Diagnosis not present

## 2024-02-21 DIAGNOSIS — I5032 Chronic diastolic (congestive) heart failure: Secondary | ICD-10-CM | POA: Diagnosis not present

## 2024-02-21 DIAGNOSIS — L89151 Pressure ulcer of sacral region, stage 1: Secondary | ICD-10-CM | POA: Diagnosis not present

## 2024-02-21 DIAGNOSIS — D62 Acute posthemorrhagic anemia: Secondary | ICD-10-CM | POA: Diagnosis not present

## 2024-02-21 NOTE — Telephone Encounter (Signed)
 Copied from CRM [id number]. Topic: Clinical - Medical Advice >> Feb 21, 2024 10:08 AM Corin V wrote: Reason for CRM: Breanna [name] with home health called to advise that patient blood pressure was high this morning. It was 190/84 at 9:40 then 162/82 at 10:00. She took her blood pressure meds at 9:00 this morning. Home health and family are noticing that it is running high in the morning but seems to be controlled in the afternoons and evenings. Since patient is only taking amlodipine once per day, Breanna [name] wanted to know if PCP wanted to do an adjustment on that. Her son [name] handles her medications. Please call him with any instructions at [phone]

## 2024-02-22 DIAGNOSIS — E1122 Type 2 diabetes mellitus with diabetic chronic kidney disease: Secondary | ICD-10-CM

## 2024-02-22 DIAGNOSIS — I5032 Chronic diastolic (congestive) heart failure: Secondary | ICD-10-CM | POA: Diagnosis not present

## 2024-02-22 DIAGNOSIS — N1832 Chronic kidney disease, stage 3b: Secondary | ICD-10-CM

## 2024-02-22 DIAGNOSIS — K317 Polyp of stomach and duodenum: Secondary | ICD-10-CM | POA: Diagnosis not present

## 2024-02-22 DIAGNOSIS — D630 Anemia in neoplastic disease: Secondary | ICD-10-CM | POA: Diagnosis not present

## 2024-02-22 DIAGNOSIS — L89151 Pressure ulcer of sacral region, stage 1: Secondary | ICD-10-CM | POA: Diagnosis not present

## 2024-02-22 DIAGNOSIS — I130 Hypertensive heart and chronic kidney disease with heart failure and stage 1 through stage 4 chronic kidney disease, or unspecified chronic kidney disease: Secondary | ICD-10-CM | POA: Diagnosis not present

## 2024-02-22 DIAGNOSIS — E870 Hyperosmolality and hypernatremia: Secondary | ICD-10-CM | POA: Diagnosis not present

## 2024-02-22 DIAGNOSIS — D120 Benign neoplasm of cecum: Secondary | ICD-10-CM | POA: Diagnosis not present

## 2024-02-22 DIAGNOSIS — D62 Acute posthemorrhagic anemia: Secondary | ICD-10-CM | POA: Diagnosis not present

## 2024-02-22 DIAGNOSIS — D631 Anemia in chronic kidney disease: Secondary | ICD-10-CM

## 2024-02-22 DIAGNOSIS — K5730 Diverticulosis of large intestine without perforation or abscess without bleeding: Secondary | ICD-10-CM | POA: Diagnosis not present

## 2024-02-24 ENCOUNTER — Ambulatory Visit: Payer: Self-pay | Admitting: Internal Medicine

## 2024-02-24 ENCOUNTER — Other Ambulatory Visit: Payer: Self-pay | Admitting: Internal Medicine

## 2024-02-24 ENCOUNTER — Encounter: Payer: Self-pay | Admitting: Internal Medicine

## 2024-02-24 MED ORDER — LOSARTAN POTASSIUM 25 MG PO TABS: ORAL_TABLET | ORAL | 5 refills | Status: DC

## 2024-02-24 NOTE — Telephone Encounter (Signed)
 Chief Complaint: High Blood Pressure Symptoms: Not sleeping well, slight headaches, dizzy two nights ago Frequency: Ongoing Pertinent Negatives: Patient denies difficulty breathing Disposition: [] ED /[] Urgent Care (no appt availability in office) / [x] Appointment(In office/virtual)/ []  [facility]/ [] Home Care/ [] Refused Recommended Disposition /[] [place] [facility]/ []  Follow-up with PCP Additional Notes: Spoke with pt's son, Amada Jupiter. Pt scheduled for an appt with PCP on 3/5. This RN educated pt son on home care, new-worsening symptoms, when to call back/seek emergent care. Pt son verbalized understanding and agrees to plan.    Copied from CRM [id number]. Topic: Clinical - Red Word Triage >> Feb 24, 2024 12:32 PM [place] wrote: Red Word that prompted transfer to Nurse Triage: Patient son Girl Schissler calling in, Patients Blood pressure has been 160/81 over last 2 weeks. Wants to know if there is additional medication she needs to be on.  Her top number is always going up. She is not sleeping during the night. Reason for Disposition  Systolic BP  >= 160 OR Diastolic >= 100  Answer Assessment - Initial Assessment Questions 1. BLOOD PRESSURE: "What is the blood pressure?" "Did you take at least two measurements 5 minutes apart?"        186/90 at 9 AM, 162/81 at 11:30 AM 2. ONSET: "When did you take your blood pressure?"     An hour ago 3. HOW: "How did you take your blood pressure?" (e.g., automatic home BP monitor, visiting nurse)     Automatic home BP 4. HISTORY: "Do you have a history of high blood pressure?"     Yes 5. MEDICINES: "Are you taking any medicines for blood pressure?" "Have you missed any doses recently?"     Was taken off one BP medication in hospital about a month ago  6. OTHER SYMPTOMS: "Do you have any symptoms?" (e.g., blurred vision, chest pain, difficulty breathing, headache, weakness)     Slight headaches, dizzy two nights ago  Protocols used: Blood  Pressure - High-A-AH

## 2024-02-25 ENCOUNTER — Telehealth: Payer: Self-pay | Admitting: Internal Medicine

## 2024-02-25 DIAGNOSIS — K317 Polyp of stomach and duodenum: Secondary | ICD-10-CM | POA: Diagnosis not present

## 2024-02-25 DIAGNOSIS — I130 Hypertensive heart and chronic kidney disease with heart failure and stage 1 through stage 4 chronic kidney disease, or unspecified chronic kidney disease: Secondary | ICD-10-CM | POA: Diagnosis not present

## 2024-02-25 DIAGNOSIS — E870 Hyperosmolality and hypernatremia: Secondary | ICD-10-CM | POA: Diagnosis not present

## 2024-02-25 DIAGNOSIS — K5730 Diverticulosis of large intestine without perforation or abscess without bleeding: Secondary | ICD-10-CM | POA: Diagnosis not present

## 2024-02-25 DIAGNOSIS — D630 Anemia in neoplastic disease: Secondary | ICD-10-CM | POA: Diagnosis not present

## 2024-02-25 DIAGNOSIS — L89151 Pressure ulcer of sacral region, stage 1: Secondary | ICD-10-CM | POA: Diagnosis not present

## 2024-02-25 DIAGNOSIS — D120 Benign neoplasm of cecum: Secondary | ICD-10-CM | POA: Diagnosis not present

## 2024-02-25 DIAGNOSIS — I5032 Chronic diastolic (congestive) heart failure: Secondary | ICD-10-CM | POA: Diagnosis not present

## 2024-02-25 DIAGNOSIS — D62 Acute posthemorrhagic anemia: Secondary | ICD-10-CM | POA: Diagnosis not present

## 2024-02-25 NOTE — Telephone Encounter (Signed)
 Copied from CRM [id number]. Topic: Clinical - Medical Advice >> Feb 25, 2024  2:35 PM Presley Raddle C wrote: Reason for CRM: Tasia Catchings from [facility] called to report high blood pressure. He stated that today it read 178/86. Please advise.

## 2024-02-26 ENCOUNTER — Ambulatory Visit: Admitting: Internal Medicine

## 2024-02-26 ENCOUNTER — Encounter: Payer: Self-pay | Admitting: Internal Medicine

## 2024-02-26 ENCOUNTER — Ambulatory Visit: Payer: Medicare PPO | Admitting: Licensed Clinical Social Worker

## 2024-02-26 VITALS — BP 124/64 | HR 53 | Temp 98.6°F | Ht 62.0 in

## 2024-02-26 DIAGNOSIS — F5101 Primary insomnia: Secondary | ICD-10-CM | POA: Diagnosis not present

## 2024-02-26 DIAGNOSIS — R634 Abnormal weight loss: Secondary | ICD-10-CM | POA: Diagnosis not present

## 2024-02-26 DIAGNOSIS — I10 Essential (primary) hypertension: Secondary | ICD-10-CM

## 2024-02-26 DIAGNOSIS — Z7984 Long term (current) use of oral hypoglycemic drugs: Secondary | ICD-10-CM | POA: Diagnosis not present

## 2024-02-26 DIAGNOSIS — R269 Unspecified abnormalities of gait and mobility: Secondary | ICD-10-CM | POA: Diagnosis not present

## 2024-02-26 DIAGNOSIS — H5316 Psychophysical visual disturbances: Secondary | ICD-10-CM

## 2024-02-26 DIAGNOSIS — E1121 Type 2 diabetes mellitus with diabetic nephropathy: Secondary | ICD-10-CM

## 2024-02-26 DIAGNOSIS — G4700 Insomnia, unspecified: Secondary | ICD-10-CM | POA: Insufficient documentation

## 2024-02-26 MED ORDER — SERTRALINE HCL 100 MG PO TABS: 100.0000 mg | ORAL_TABLET | Freq: Every day | ORAL | 1 refills | Status: DC

## 2024-02-26 NOTE — Assessment & Plan Note (Addendum)
 Pt states she is having issues with sleeping once she awakes at night to use the restroom she is not able to fall back asleep. Pt states she has tried Tylenol PM and it has not helped.  Use Sertaline 100 mg qhs

## 2024-02-26 NOTE — Assessment & Plan Note (Signed)
 Some wt loss. None lately

## 2024-02-26 NOTE — Progress Notes (Signed)
 Subjective:  Patient ID: Breanna [name], female    DOB: September 21, 1931  Age: 89 y.o. MRN: [id number]  CC: Hypertension (Slight headache in the base of back of head. Pt states she is having issues with sleeping once she awakes at night to use the restroom she is not able to fall back asleep. Pt states she has tried Tylenol PM and it has not helped. Tingling in her feet at night and cramps in lower leg area.)   HPI [facility] presents for Hypertension.  Slight headache in the base of back of head. Pt states she is having issues with sleeping once she awakes at night to use the restroom she is not able to fall back asleep. Pt states she has tried Tylenol PM and it has not helped. Tingling in her feet at night and cramps in lower leg area.  Outpatient Medications Prior to Visit  Medication Sig Dispense Refill   acetaminophen (TYLENOL) 325 MG tablet Take 2 tablets (650 mg total) by mouth every 6 (six) hours as needed for moderate pain or fever.     allopurinol (ZYLOPRIM) 100 MG tablet Take 0.5 tablets (50 mg total) by mouth daily. 90 tablet 1   amLODipine (NORVASC) 5 MG tablet Take 1 tablet (5 mg total) by mouth daily. 90 tablet 3   atropine 1 % ophthalmic solution Place 1 drop into both eyes daily.     [employer] VITAMIN B12 1000 MCG tablet TAKE 1 TABLET (1,000 MCG TOTAL) BY MOUTH EVERY DAY 90 tablet 3   dorzolamide-timolol (COSOPT) 22.3-6.8 MG/ML ophthalmic solution Place 1 drop into both eyes 2 (two) times daily.      feeding supplement, ENSURE ENLIVE, (ENSURE ENLIVE) LIQD Take 237 mLs by mouth daily at 2 PM. 237 mL 12   gabapentin (NEURONTIN) 100 MG capsule Take 1 capsule (100 mg total) by mouth 2 (two) times daily. 180 capsule 5   latanoprost (XALATAN) 0.005 % ophthalmic solution Place 1 drop into both eyes at bedtime.      losartan (COZAAR) 25 MG tablet Take daily as needed for systolic blood pressure greater than 160 30 tablet 5   NON FORMULARY Place 10 drops under the tongue daily as needed  (Pain). Medication: CBD oil     pantoprazole (PROTONIX) 40 MG tablet TAKE 1 TABLET BY MOUTH EVERY DAY 90 tablet 3   sertraline (ZOLOFT) 100 MG tablet TAKE 1 TABLET BY MOUTH EVERY DAY 90 tablet 1   brimonidine (ALPHAGAN) 0.2 % ophthalmic solution Place 1 drop into the right eye 2 (two) times daily. (Patient not taking: Reported on 01/17/2024)     glucose blood (GNP TRUE METRIX GLUCOSE STRIPS) test strip Use to check blood sugars twice a day (Patient not taking: Reported on 01/29/2024) 200 each 2   Netarsudil-Latanoprost 0.02-0.005 % SOLN Apply to eye. (Patient not taking: Reported on 01/29/2024)     rosuvastatin (CRESTOR) 5 MG tablet Take 5 mg by mouth daily. (Patient not taking: Reported on 01/17/2024)     senna (SENOKOT) 8.6 MG TABS tablet Take 1 tablet (8.6 mg total) by mouth 2 (two) times daily. (Patient not taking: Reported on 01/17/2024) 120 tablet 0   Facility-Administered Medications Prior to Visit  Medication Dose Route Frequency Provider Last Rate Last Admin   regadenoson (LEXISCAN) injection SOLN 0.4 mg  0.4 mg Intravenous Once Meriam Sprague, MD       technetium tetrofosmin (TC-MYOVIEW) injection 31.1 millicurie  31.1 millicurie Intravenous Once PRN Meriam Sprague, MD  ROS: Review of Systems  Constitutional:  Positive for fatigue. Negative for activity change, appetite change, chills and unexpected weight change.  HENT:  Negative for congestion, mouth sores and sinus pressure.   Eyes:  Negative for visual disturbance.  Respiratory:  Negative for cough and chest tightness.   Cardiovascular:  Negative for chest pain.  Gastrointestinal:  Negative for abdominal pain and nausea.  Genitourinary:  Positive for urgency. Negative for difficulty urinating, frequency and vaginal pain.  Musculoskeletal:  Positive for arthralgias, back pain and gait problem.  Skin:  Negative for pallor and rash.  Neurological:  Negative for dizziness, tremors, weakness, numbness and headaches.   Psychiatric/Behavioral:  Positive for sleep disturbance. Negative for behavioral problems, confusion, decreased concentration, hallucinations and suicidal ideas. The patient is not nervous/anxious.     Objective:  BP 124/64   Pulse (!) 53   Temp 98.6 F (37 C) (Oral)   Ht 5\' 2"  (1.575 m)   SpO2 98%   BMI 19.57 kg/m   BP Readings from Last 3 Encounters:  02/26/24 124/64  02/14/24 108/68  01/30/24 132/88    Wt Readings from Last 3 Encounters:  02/14/24 107 lb (48.5 kg)  01/30/24 113 lb 1.5 oz (51.3 kg)  01/28/24 107 lb 5.8 oz (48.7 kg)    Physical Exam Constitutional:      General: She is not in acute distress.    Appearance: Normal appearance. She is well-developed.  HENT:     Head: Normocephalic.     Right Ear: External ear normal.     Left Ear: External ear normal.     Nose: Nose normal.  Eyes:     General:        Right eye: No discharge.        Left eye: No discharge.     Conjunctiva/sclera: Conjunctivae normal.     Pupils: Pupils are equal, round, and reactive to light.  Neck:     Thyroid: No thyromegaly.     Vascular: No JVD.     Trachea: No tracheal deviation.  Cardiovascular:     Rate and Rhythm: Normal rate and regular rhythm.     Heart sounds: Normal heart sounds.  Pulmonary:     Effort: No respiratory distress.     Breath sounds: No stridor. No wheezing.  Abdominal:     General: Bowel sounds are normal. There is no distension.     Palpations: Abdomen is soft. There is no mass.     Tenderness: There is no abdominal tenderness. There is no guarding or rebound.  Musculoskeletal:        General: No tenderness.     Cervical back: Normal range of motion and neck supple. No rigidity.  Lymphadenopathy:     Cervical: No cervical adenopathy.  Skin:    Findings: No erythema or rash.  Neurological:     Cranial Nerves: No cranial nerve deficit.     Motor: No abnormal muscle tone.     Coordination: Coordination normal.     Deep Tendon Reflexes: Reflexes  normal.  Psychiatric:        Behavior: Behavior normal.        Thought Content: Thought content normal.        Judgment: Judgment normal.   blind  Lab Results  Component Value Date   WBC 6.6 01/30/2024   HGB 9.6 (L) 01/30/2024   HCT 30.0 (L) 01/30/2024   PLT 198.0 01/30/2024   GLUCOSE 123 (H) 01/30/2024   CHOL 127 03/01/2023  TRIG 42 03/01/2023   HDL 53 03/01/2023   LDLCALC 66 03/01/2023   ALT 11 01/30/2024   AST 21 01/30/2024   NA 140 01/30/2024   K 3.7 01/30/2024   CL 108 01/30/2024   CREATININE 0.89 01/30/2024   BUN 20 01/30/2024   CO2 25 01/30/2024   TSH 0.54 09/11/2023   HGBA1C 6.8 (H) 09/11/2023    ECHOCARDIOGRAM COMPLETE Result Date: 01/18/2024    ECHOCARDIOGRAM REPORT   Patient Name:   ALLANNA BRESEE Date of Exam: 01/18/2024 Medical Rec #:  [id number]       Height:       62.0 in Accession #:    [id number]      Weight:       113.0 lb Date of Birth:  1931-01-14        BSA:          1.500 m Patient Age:    89 years        BP:           173/89 mmHg Patient Gender: F               HR:           55 bpm. Exam Location:  Inpatient Procedure: 2D Echo, Cardiac Doppler and Color Doppler Indications:    Syncope  History:        Patient has prior history of Echocardiogram examinations, most                 recent 03/01/2023. Arrythmias:Bradycardia; Risk                 Factors:Hypertension and Diabetes.  Sonographer:    Webb Laws Referring Phys: [id number] TIMOTHY S OPYD IMPRESSIONS  1. Left ventricular ejection fraction, by estimation, is 60 to 65%. The left ventricle has normal function. The left ventricle has no regional wall motion abnormalities. There is moderate left ventricular hypertrophy. Left ventricular diastolic parameters are consistent with Grade II diastolic dysfunction (pseudonormalization). Elevated left atrial pressure. The average left ventricular global longitudinal strain is -20.3 %.  2. Right ventricular systolic function is normal. The right ventricular size is  normal. There is moderately elevated pulmonary artery systolic pressure. The estimated right ventricular systolic pressure is 47.4 mmHg.  3. Left atrial size was severely dilated.  4. The mitral valve is degenerative. Moderate mitral valve regurgitation.  5. Tricuspid valve regurgitation is mild to moderate.  6. The aortic valve is tricuspid. Aortic valve regurgitation is trivial. Aortic valve sclerosis is present, with no evidence of aortic valve stenosis.  7. The inferior vena cava is normal in size with greater than 50% respiratory variability, suggesting right atrial pressure of 3 mmHg. FINDINGS  Left Ventricle: Left ventricular ejection fraction, by estimation, is 60 to 65%. The left ventricle has normal function. The left ventricle has no regional wall motion abnormalities. The average left ventricular global longitudinal strain is -20.3 %. The left ventricular internal cavity size was normal in size. There is moderate left ventricular hypertrophy. Left ventricular diastolic parameters are consistent with Grade II diastolic dysfunction (pseudonormalization). Elevated left atrial pressure. Right Ventricle: The right ventricular size is normal. No increase in right ventricular wall thickness. Right ventricular systolic function is normal. There is moderately elevated pulmonary artery systolic pressure. The tricuspid regurgitant velocity is 3.33 m/s, and with an assumed right atrial pressure of 3 mmHg, the estimated right ventricular systolic pressure is 47.4 mmHg. Left Atrium: Left atrial size was severely dilated. Right Atrium:  Right atrial size was normal in size. Pericardium: There is no evidence of pericardial effusion. Mitral Valve: The mitral valve is degenerative in appearance. Moderate mitral valve regurgitation. Tricuspid Valve: The tricuspid valve is normal in structure. Tricuspid valve regurgitation is mild to moderate. Aortic Valve: The aortic valve is tricuspid. Aortic valve regurgitation is  trivial. Aortic regurgitation PHT measures 654 msec. Aortic valve sclerosis is present, with no evidence of aortic valve stenosis. Pulmonic Valve: The pulmonic valve was not well visualized. Pulmonic valve regurgitation is not visualized. Aorta: The aortic root and ascending aorta are structurally normal, with no evidence of dilitation. Venous: The inferior vena cava is normal in size with greater than 50% respiratory variability, suggesting right atrial pressure of 3 mmHg. IAS/Shunts: The interatrial septum was not well visualized.  LEFT VENTRICLE PLAX 2D LVIDd:         3.80 cm     Diastology LVIDs:         2.40 cm     LV e' medial:    3.92 cm/s LV PW:         1.60 cm     LV E/e' medial:  18.6 LV IVS:        1.60 cm     LV e' lateral:   4.46 cm/s LVOT diam:     2.00 cm     LV E/e' lateral: 16.4 LV SV:         64 LV SV Index:   43          2D Longitudinal Strain LVOT Area:     3.14 cm    2D Strain GLS Avg:     -20.3 %  LV Volumes (MOD) LV vol d, MOD A2C: 54.4 ml LV vol d, MOD A4C: 68.2 ml LV vol s, MOD A2C: 21.8 ml LV vol s, MOD A4C: 23.4 ml LV SV MOD A2C:     32.6 ml LV SV MOD A4C:     68.2 ml LV SV MOD BP:      38.2 ml RIGHT VENTRICLE             IVC RV Basal diam:  3.00 cm     IVC diam: 1.50 cm RV S prime:     14.50 cm/s TAPSE (M-mode): 2.9 cm LEFT ATRIUM              Index        RIGHT ATRIUM           Index LA diam:        4.70 cm  3.13 cm/m   RA Area:     10.60 cm LA Vol (A2C):   98.4 ml  65.61 ml/m  RA Volume:   21.30 ml  14.20 ml/m LA Vol (A4C):   120.0 ml 80.01 ml/m LA Biplane Vol: 116.0 ml 77.34 ml/m  AORTIC VALVE LVOT Vmax:   97.30 cm/s LVOT Vmean:  68.000 cm/s LVOT VTI:    0.203 m AI PHT:      654 msec  AORTA Ao Root diam: 3.10 cm Ao Asc diam:  3.20 cm MITRAL VALVE                  TRICUSPID VALVE MV Area (PHT): 3.65 cm       TR Peak grad:   44.4 mmHg MV Decel Time: 208 msec       TR Vmax:        333.00 cm/s MR Peak grad:    169.5  mmHg MR Mean grad:    108.0 mmHg   SHUNTS MR Vmax:         651.00  cm/s  Systemic VTI:  0.20 m MR Vmean:        495.0 cm/s   Systemic Diam: 2.00 cm MR PISA:         1.01 cm MR PISA Eff ROA: 6 mm MR PISA Radius:  0.40 cm MV E velocity: 73.10 cm/s MV A velocity: 120.00 cm/s MV E/A ratio:  0.61 Epifanio Lesches MD Electronically signed by Epifanio Lesches MD Signature Date/Time: 01/18/2024/1:10:41 PM    Final    DG Chest Portable 1 View Result Date: 01/17/2024 CLINICAL DATA:  Fever.  Vomiting and diarrhea for 3 days.  Syncope EXAM: PORTABLE CHEST 1 VIEW COMPARISON:  X-ray 02/28/2023. FINDINGS: Hyperinflation. No consolidation, pneumothorax or effusion. No edema. Enlarged cardiopericardial silhouette tortuous aorta, similar to previous. Osteopenia. IMPRESSION: Hyperinflation.  Enlarged heart.  No consolidation. Electronically Signed   By: Karen Kays M.D.   On: 01/17/2024 17:50    Assessment & Plan:   Problem List Items Addressed This Visit     DM2 (diabetes mellitus, type 2) (HCC)   Chronic On Metformin      HTN (hypertension) - Primary   Use losartan 25 mg as needed systolic blood pressure over 160      Weight loss   Some wt loss. None lately      Gait disorder   In a w/c       Insomnia   Pt states she is having issues with sleeping once she awakes at night to use the restroom she is not able to fall back asleep. Pt states she has tried Tylenol PM and it has not helped.  Use Sertaline 100 mg qhs         Meds ordered this encounter  Medications   sertraline (ZOLOFT) 100 MG tablet    Sig: Take 1 tablet (100 mg total) by mouth at bedtime.    Dispense:  90 tablet    Refill:  1      Follow-up: Return in about 3 months (around 05/28/2024) for a follow-up visit.  Sonda Primes, MD

## 2024-02-26 NOTE — Telephone Encounter (Signed)
 We agreed to use the losartan prescription for elevated blood pressure as needed.  Please see the prescription.  Thanks

## 2024-02-26 NOTE — Assessment & Plan Note (Signed)
 Use losartan 25 mg as needed systolic blood pressure over 160

## 2024-02-26 NOTE — Assessment & Plan Note (Signed)
Chronic On Metformin 

## 2024-02-26 NOTE — Progress Notes (Signed)
 [facility] Counselor/Therapist Progress Note  Patient ID: Breanna [name], MRN: [id number]    Date: 02/26/24  Time Spent: 11:05  am - 11:59 am : 54 Minutes  Treatment Type: Individual Therapy.  Reported Symptoms: has been seeing wallpapered rooms, children males and female shadows without faces, usually happens when tired  Mental Status Exam: Appearance:  Casual     Behavior: Appropriate  Motor: Normal  Speech/Language:  Normal Rate  Affect: Appropriate  Mood: normal  Thought process: normal  Thought content:   WNL  Sensory/Perceptual disturbances:   WNL  Orientation: oriented to person, place, and time/date  Attention: Good  Concentration: Good  Memory: WNL  Fund of knowledge:  Good  Insight:   Good  Judgment:  Good  Impulse Control: Good   Risk Assessment: Danger to Self:  No Self-injurious Behavior: No Danger to Others: No Duty to Warn:no Physical Aggression / Violence:No  Access to Firearms a concern: No  Gang Involvement:No   Subjective:   [employer] participated in the session, in person in the office with the therapist, and consented to treatment. Breanna [name] reviewed the events of the past week. Will be visiting [employer] for more support on the books and supports for her as a newly blind patient.       Interventions: Narrative  Diagnosis:   Maureen Ralphs syndrome  Psychiatric Treatment: No , N/A  Treatment Plan:  Client Abilities/Strengths Breanna [name] is aware of her current limitations with the loss of her site.  She is still focused on her independence although her family is very fearful of her well being due to her diagnosis and health issues.    Support System: Family, Church, and Friends   Client Treatment Preferences In person  Client Statement of Needs Breanna [name] would like to utilize her sessions as a way to process her new diagnosis of blindness.    Treatment Level Weekly  Symptoms  Sleep Disturbances   (Status:  declined) Waking in the middle of the night Resilient/Independent   (Status: maintained) Still focused on self care  Goals:   Breanna [name] experiences symptoms of seeing figures/shadow people but not feeling afraid.  Had a recent hospital stay and has physical therapy following up at this point.   Has been doing well socially with her friend who is 51 and a church member as well.    Target Date: 02/26/24 Frequency: Weekly  Progress: 20 Modality: individual    Therapist will provide referrals for additional resources as appropriate.  Therapist will provide psycho-education regarding Breanna [name]'s diagnosis and corresponding treatment approaches and interventions. Licensed Clinical Mental Health Counselor, Anselmo Pickler, [facility] will support the patient's ability to achieve the goals identified. will employ CBT, BA, Problem-solving, Solution Focused, Mindfulness,  coping skills, & other evidenced-based practices will be used to promote progress towards healthy functioning to help manage decrease symptoms associated with her diagnosis.   Reduce overall level, frequency, and intensity of the feelings of depression, anxiety and panic evidenced by increased adjustment to diagnosis.   Verbally express understanding of the relationship between feelings of depression, anxiety and their impact on thinking patterns and behaviors. Verbalize an understanding of the role that distorted thinking plays in creating fears, excessive worry, and ruminations.  Breanna [name] participated in the creation of the treatment plan)   Anselmo Pickler, [facility]

## 2024-02-26 NOTE — Assessment & Plan Note (Signed)
In a w/c 

## 2024-02-28 DIAGNOSIS — I5032 Chronic diastolic (congestive) heart failure: Secondary | ICD-10-CM | POA: Diagnosis not present

## 2024-02-28 DIAGNOSIS — K5730 Diverticulosis of large intestine without perforation or abscess without bleeding: Secondary | ICD-10-CM | POA: Diagnosis not present

## 2024-02-28 DIAGNOSIS — I130 Hypertensive heart and chronic kidney disease with heart failure and stage 1 through stage 4 chronic kidney disease, or unspecified chronic kidney disease: Secondary | ICD-10-CM | POA: Diagnosis not present

## 2024-02-28 DIAGNOSIS — L89151 Pressure ulcer of sacral region, stage 1: Secondary | ICD-10-CM | POA: Diagnosis not present

## 2024-02-28 DIAGNOSIS — E870 Hyperosmolality and hypernatremia: Secondary | ICD-10-CM | POA: Diagnosis not present

## 2024-02-28 DIAGNOSIS — D62 Acute posthemorrhagic anemia: Secondary | ICD-10-CM | POA: Diagnosis not present

## 2024-02-28 DIAGNOSIS — D630 Anemia in neoplastic disease: Secondary | ICD-10-CM | POA: Diagnosis not present

## 2024-02-28 DIAGNOSIS — D120 Benign neoplasm of cecum: Secondary | ICD-10-CM | POA: Diagnosis not present

## 2024-02-28 DIAGNOSIS — K317 Polyp of stomach and duodenum: Secondary | ICD-10-CM | POA: Diagnosis not present

## 2024-03-02 ENCOUNTER — Encounter: Payer: Self-pay | Admitting: Internal Medicine

## 2024-03-03 ENCOUNTER — Telehealth: Payer: Self-pay | Admitting: Internal Medicine

## 2024-03-03 DIAGNOSIS — I130 Hypertensive heart and chronic kidney disease with heart failure and stage 1 through stage 4 chronic kidney disease, or unspecified chronic kidney disease: Secondary | ICD-10-CM | POA: Diagnosis not present

## 2024-03-03 DIAGNOSIS — K317 Polyp of stomach and duodenum: Secondary | ICD-10-CM | POA: Diagnosis not present

## 2024-03-03 DIAGNOSIS — D630 Anemia in neoplastic disease: Secondary | ICD-10-CM | POA: Diagnosis not present

## 2024-03-03 DIAGNOSIS — I5032 Chronic diastolic (congestive) heart failure: Secondary | ICD-10-CM | POA: Diagnosis not present

## 2024-03-03 DIAGNOSIS — E870 Hyperosmolality and hypernatremia: Secondary | ICD-10-CM | POA: Diagnosis not present

## 2024-03-03 DIAGNOSIS — D62 Acute posthemorrhagic anemia: Secondary | ICD-10-CM | POA: Diagnosis not present

## 2024-03-03 DIAGNOSIS — D120 Benign neoplasm of cecum: Secondary | ICD-10-CM | POA: Diagnosis not present

## 2024-03-03 DIAGNOSIS — L89151 Pressure ulcer of sacral region, stage 1: Secondary | ICD-10-CM | POA: Diagnosis not present

## 2024-03-03 DIAGNOSIS — K5730 Diverticulosis of large intestine without perforation or abscess without bleeding: Secondary | ICD-10-CM | POA: Diagnosis not present

## 2024-03-03 NOTE — Telephone Encounter (Unsigned)
 Copied from CRM [id number]. Topic: Clinical - Medication Question >> Mar 03, 2024 10:31 AM Ernst Spell wrote: Reason for CRM: Breanna [name], the pt's son called and wanted to confirm if Dr. Posey Rea wants the patient to take Sertraline. If so, they want to know how he wants her to take it. Please call and advise.

## 2024-03-04 DIAGNOSIS — L89151 Pressure ulcer of sacral region, stage 1: Secondary | ICD-10-CM | POA: Diagnosis not present

## 2024-03-04 DIAGNOSIS — D62 Acute posthemorrhagic anemia: Secondary | ICD-10-CM | POA: Diagnosis not present

## 2024-03-04 DIAGNOSIS — D120 Benign neoplasm of cecum: Secondary | ICD-10-CM | POA: Diagnosis not present

## 2024-03-04 DIAGNOSIS — E870 Hyperosmolality and hypernatremia: Secondary | ICD-10-CM | POA: Diagnosis not present

## 2024-03-04 DIAGNOSIS — I5032 Chronic diastolic (congestive) heart failure: Secondary | ICD-10-CM | POA: Diagnosis not present

## 2024-03-04 DIAGNOSIS — D630 Anemia in neoplastic disease: Secondary | ICD-10-CM | POA: Diagnosis not present

## 2024-03-04 DIAGNOSIS — I130 Hypertensive heart and chronic kidney disease with heart failure and stage 1 through stage 4 chronic kidney disease, or unspecified chronic kidney disease: Secondary | ICD-10-CM | POA: Diagnosis not present

## 2024-03-04 DIAGNOSIS — K317 Polyp of stomach and duodenum: Secondary | ICD-10-CM | POA: Diagnosis not present

## 2024-03-04 DIAGNOSIS — K5730 Diverticulosis of large intestine without perforation or abscess without bleeding: Secondary | ICD-10-CM | POA: Diagnosis not present

## 2024-03-04 NOTE — Telephone Encounter (Signed)
 Yes. Use Sertaline 100 mg at bedtime. Thanks

## 2024-03-04 NOTE — Telephone Encounter (Signed)
 Spoke with the pt son and was able to inform him of providers advice "Yes. Use Sertaline 100 mg at bedtime. Thanks."

## 2024-03-06 ENCOUNTER — Telehealth: Payer: Self-pay | Admitting: Internal Medicine

## 2024-03-06 DIAGNOSIS — K5730 Diverticulosis of large intestine without perforation or abscess without bleeding: Secondary | ICD-10-CM | POA: Diagnosis not present

## 2024-03-06 DIAGNOSIS — E870 Hyperosmolality and hypernatremia: Secondary | ICD-10-CM | POA: Diagnosis not present

## 2024-03-06 DIAGNOSIS — D120 Benign neoplasm of cecum: Secondary | ICD-10-CM | POA: Diagnosis not present

## 2024-03-06 DIAGNOSIS — K317 Polyp of stomach and duodenum: Secondary | ICD-10-CM | POA: Diagnosis not present

## 2024-03-06 DIAGNOSIS — L89151 Pressure ulcer of sacral region, stage 1: Secondary | ICD-10-CM | POA: Diagnosis not present

## 2024-03-06 DIAGNOSIS — I5032 Chronic diastolic (congestive) heart failure: Secondary | ICD-10-CM | POA: Diagnosis not present

## 2024-03-06 DIAGNOSIS — I130 Hypertensive heart and chronic kidney disease with heart failure and stage 1 through stage 4 chronic kidney disease, or unspecified chronic kidney disease: Secondary | ICD-10-CM | POA: Diagnosis not present

## 2024-03-06 DIAGNOSIS — D630 Anemia in neoplastic disease: Secondary | ICD-10-CM | POA: Diagnosis not present

## 2024-03-06 DIAGNOSIS — D62 Acute posthemorrhagic anemia: Secondary | ICD-10-CM | POA: Diagnosis not present

## 2024-03-06 NOTE — Telephone Encounter (Signed)
 Copied from CRM [id number]. Topic: Clinical - Home Health Verbal Orders >> Mar 06, 2024 11:32 AM Pascal Lux wrote: Caller/Agency: [facility] Callback Number: [phone] Service Requested: Physical Therapy Frequency: 2x week for 3 weeks and 1x week for 1 week Any new concerns about the patient? No

## 2024-03-10 DIAGNOSIS — I5032 Chronic diastolic (congestive) heart failure: Secondary | ICD-10-CM | POA: Diagnosis not present

## 2024-03-10 DIAGNOSIS — D630 Anemia in neoplastic disease: Secondary | ICD-10-CM | POA: Diagnosis not present

## 2024-03-10 DIAGNOSIS — I130 Hypertensive heart and chronic kidney disease with heart failure and stage 1 through stage 4 chronic kidney disease, or unspecified chronic kidney disease: Secondary | ICD-10-CM | POA: Diagnosis not present

## 2024-03-10 DIAGNOSIS — K317 Polyp of stomach and duodenum: Secondary | ICD-10-CM | POA: Diagnosis not present

## 2024-03-10 DIAGNOSIS — K5730 Diverticulosis of large intestine without perforation or abscess without bleeding: Secondary | ICD-10-CM | POA: Diagnosis not present

## 2024-03-10 DIAGNOSIS — D120 Benign neoplasm of cecum: Secondary | ICD-10-CM | POA: Diagnosis not present

## 2024-03-10 DIAGNOSIS — E870 Hyperosmolality and hypernatremia: Secondary | ICD-10-CM | POA: Diagnosis not present

## 2024-03-10 DIAGNOSIS — D62 Acute posthemorrhagic anemia: Secondary | ICD-10-CM | POA: Diagnosis not present

## 2024-03-10 DIAGNOSIS — L89151 Pressure ulcer of sacral region, stage 1: Secondary | ICD-10-CM | POA: Diagnosis not present

## 2024-03-10 NOTE — Telephone Encounter (Signed)
 Copied from CRM [id number]. Topic: General - Other >> Mar 10, 2024  1:58 PM Kathryne Eriksson wrote: Reason for CRM: [facility] >> Mar 10, 2024  2:01 PM Kathryne Eriksson wrote: Breanna [name] "PT" [facility] [phone]  Called on behalf of patient to report elevated blood pressure. Patients blood pressure was 180/100 at 10AM and 160/100 at 11AM.  Physical Therapist states he's going to hold off on physical therapy until they can get her blood pressure under control.

## 2024-03-10 NOTE — Telephone Encounter (Signed)
 Breanna [name] "PT" [facility] [phone]  Called on behalf of patient to report elevated blood pressure. Patients blood pressure was 180/100 at 10AM and 160/100 at 11AM.  Physical Therapist states he's going to hold off on physical therapy until they can get her blood pressure under control

## 2024-03-10 NOTE — Telephone Encounter (Signed)
 The family has our guidelines what to do for elevated blood pressure.  Thank you

## 2024-03-10 NOTE — Telephone Encounter (Signed)
 Okay.  Thanks.

## 2024-03-11 NOTE — Telephone Encounter (Signed)
 Pt was seen in office and the following was stated per PCP "The family has our guidelines what to do for elevated blood pressure. Thank you."

## 2024-03-12 ENCOUNTER — Encounter: Payer: Self-pay | Admitting: Family Medicine

## 2024-03-12 ENCOUNTER — Ambulatory Visit: Payer: Self-pay

## 2024-03-12 ENCOUNTER — Ambulatory Visit: Admitting: Family Medicine

## 2024-03-12 VITALS — BP 200/90 | HR 51 | Temp 98.0°F | Ht 62.0 in | Wt 107.0 lb

## 2024-03-12 DIAGNOSIS — I130 Hypertensive heart and chronic kidney disease with heart failure and stage 1 through stage 4 chronic kidney disease, or unspecified chronic kidney disease: Secondary | ICD-10-CM | POA: Diagnosis not present

## 2024-03-12 DIAGNOSIS — D120 Benign neoplasm of cecum: Secondary | ICD-10-CM | POA: Diagnosis not present

## 2024-03-12 DIAGNOSIS — K922 Gastrointestinal hemorrhage, unspecified: Secondary | ICD-10-CM

## 2024-03-12 DIAGNOSIS — I10 Essential (primary) hypertension: Secondary | ICD-10-CM

## 2024-03-12 DIAGNOSIS — D62 Acute posthemorrhagic anemia: Secondary | ICD-10-CM | POA: Diagnosis not present

## 2024-03-12 DIAGNOSIS — E870 Hyperosmolality and hypernatremia: Secondary | ICD-10-CM | POA: Diagnosis not present

## 2024-03-12 DIAGNOSIS — K317 Polyp of stomach and duodenum: Secondary | ICD-10-CM | POA: Diagnosis not present

## 2024-03-12 DIAGNOSIS — L89151 Pressure ulcer of sacral region, stage 1: Secondary | ICD-10-CM | POA: Diagnosis not present

## 2024-03-12 DIAGNOSIS — I5032 Chronic diastolic (congestive) heart failure: Secondary | ICD-10-CM | POA: Diagnosis not present

## 2024-03-12 DIAGNOSIS — D630 Anemia in neoplastic disease: Secondary | ICD-10-CM | POA: Diagnosis not present

## 2024-03-12 DIAGNOSIS — K5730 Diverticulosis of large intestine without perforation or abscess without bleeding: Secondary | ICD-10-CM | POA: Diagnosis not present

## 2024-03-12 LAB — CBC WITH DIFFERENTIAL/PLATELET
Basophils Absolute: 0 10*3/uL (ref 0.0–0.1)
Basophils Relative: 0.5 % (ref 0.0–3.0)
Eosinophils Absolute: 0.1 10*3/uL (ref 0.0–0.7)
Eosinophils Relative: 3 % (ref 0.0–5.0)
HCT: 29.6 % — ABNORMAL LOW (ref 36.0–46.0)
Hemoglobin: 9.7 g/dL — ABNORMAL LOW (ref 12.0–15.0)
Lymphocytes Relative: 26.4 % (ref 12.0–46.0)
Lymphs Abs: 1 10*3/uL (ref 0.7–4.0)
MCHC: 32.6 g/dL (ref 30.0–36.0)
MCV: 87.7 fl (ref 78.0–100.0)
Monocytes Absolute: 0.4 10*3/uL (ref 0.1–1.0)
Monocytes Relative: 9.3 % (ref 3.0–12.0)
Neutro Abs: 2.3 10*3/uL (ref 1.4–7.7)
Neutrophils Relative %: 60.8 % (ref 43.0–77.0)
Platelets: 123 10*3/uL — ABNORMAL LOW (ref 150.0–400.0)
RBC: 3.38 Mil/uL — ABNORMAL LOW (ref 3.87–5.11)
RDW: 15.9 % — ABNORMAL HIGH (ref 11.5–15.5)
WBC: 3.8 10*3/uL — ABNORMAL LOW (ref 4.0–10.5)

## 2024-03-12 LAB — COMPREHENSIVE METABOLIC PANEL
ALT: 7 U/L (ref 0–35)
AST: 16 U/L (ref 0–37)
Albumin: 3.4 g/dL — ABNORMAL LOW (ref 3.5–5.2)
Alkaline Phosphatase: 86 U/L (ref 39–117)
BUN: 26 mg/dL — ABNORMAL HIGH (ref 6–23)
CO2: 29 meq/L (ref 19–32)
Calcium: 9.5 mg/dL (ref 8.4–10.5)
Chloride: 106 meq/L (ref 96–112)
Creatinine, Ser: 1.17 mg/dL (ref 0.40–1.20)
GFR: 41.07 mL/min — ABNORMAL LOW (ref >60.00)
Glucose, Bld: 70 mg/dL (ref 70–99)
Potassium: 4.3 meq/L (ref 3.5–5.1)
Sodium: 140 meq/L (ref 135–145)
Total Bilirubin: 0.3 mg/dL (ref 0.2–1.2)
Total Protein: 6.6 g/dL (ref 6.0–8.3)

## 2024-03-12 MED ORDER — LOSARTAN POTASSIUM 50 MG PO TABS: 50.0000 mg | ORAL_TABLET | Freq: Every day | ORAL | 1 refills | Status: DC

## 2024-03-12 NOTE — Progress Notes (Unsigned)
 Acute Office Visit  Subjective:     Patient ID: Breanna [name], female    DOB: [birth year], 89 y.o.   MRN: [id number]  Chief Complaint  Patient presents with   Acute Visit    HPI Patient is in today for elevated blood pressure. She is accompanied by her son today. Reports blood pressures have been 200s over 90s.  Patient reports that she has had some headaches with this. Report compliance with medication regimen. Son states that she has been taking losartan 25 mg once daily since systolic blood pressures have continuously been greater than 160 for the last 5 days. She was recently hospitalized for GI bleed.  Prior to this, she was taking 100 mg of losartan once daily, son is concerned that the 25 is not enough. Reports that she does feel very tired, but otherwise she is feeling better than she has been. Cannot tell me if there has been any blood in the stool or dark tarry stools, patient is blind and has an aide at home that helps her but she goes to the restroom herself. Denies other concerns today. Medical history as outlined below.  ROS Per HPI      Objective:    BP (!) 200/90 (BP Location: Left Arm, Patient Position: Sitting)   Pulse (!) 51   Temp 98 F (36.7 C) (Temporal)   Ht 5\' 2"  (1.575 m)   Wt 107 lb (48.5 kg)   SpO2 95%   BMI 19.57 kg/m    Physical Exam Vitals and nursing note reviewed.  Constitutional:      General: She is not in acute distress.    Appearance: She is normal weight.     Comments: Frail elderly   HENT:     Head: Normocephalic and atraumatic.  Eyes:     Comments: Pt is blind  Cardiovascular:     Rate and Rhythm: Normal rate and regular rhythm.     Heart sounds: Normal heart sounds.  Pulmonary:     Effort: Pulmonary effort is normal. No respiratory distress.     Breath sounds: Normal breath sounds. No wheezing, rhonchi or rales.  Abdominal:     General: Abdomen is flat. Bowel sounds are normal. There is no distension.      Palpations: Abdomen is soft.     Tenderness: There is no abdominal tenderness. There is no guarding.  Musculoskeletal:     Cervical back: Normal range of motion.     Comments: In wheelchair for visit  Lymphadenopathy:     Cervical: No cervical adenopathy.  Neurological:     General: No focal deficit present.     Mental Status: She is alert and oriented to person, place, and time.  Psychiatric:        Mood and Affect: Mood normal.        Thought Content: Thought content normal.     Results for orders placed or performed in visit on 03/12/24  CBC with Differential/Platelet  Result Value Ref Range   WBC 3.8 (L) 4.0 - 10.5 K/uL   RBC 3.38 (L) 3.87 - 5.11 Mil/uL   Hemoglobin 9.7 (L) 12.0 - 15.0 g/dL   HCT 40.9 (L) 81.1 - 91.4 %   MCV 87.7 78.0 - 100.0 fl   MCHC 32.6 30.0 - 36.0 g/dL   RDW 78.2 (H) 95.6 - 21.3 %   Platelets 123.0 (L) 150.0 - 400.0 K/uL   Neutrophils Relative % 60.8 43.0 - 77.0 %   Lymphocytes Relative  26.4 12.0 - 46.0 %   Monocytes Relative 9.3 3.0 - 12.0 %   Eosinophils Relative 3.0 0.0 - 5.0 %   Basophils Relative 0.5 0.0 - 3.0 %   Neutro Abs 2.3 1.4 - 7.7 K/uL   Lymphs Abs 1.0 0.7 - 4.0 K/uL   Monocytes Absolute 0.4 0.1 - 1.0 K/uL   Eosinophils Absolute 0.1 0.0 - 0.7 K/uL   Basophils Absolute 0.0 0.0 - 0.1 K/uL  Comprehensive metabolic panel  Result Value Ref Range   Sodium 140 135 - 145 mEq/L   Potassium 4.3 3.5 - 5.1 mEq/L   Chloride 106 96 - 112 mEq/L   CO2 29 19 - 32 mEq/L   Glucose, Bld 70 70 - 99 mg/dL   BUN 26 (H) 6 - 23 mg/dL   Creatinine, Ser 9.56 0.40 - 1.20 mg/dL   Total Bilirubin 0.3 0.2 - 1.2 mg/dL   Alkaline Phosphatase 86 39 - 117 U/L   AST 16 0 - 37 U/L   ALT 7 0 - 35 U/L   Total Protein 6.6 6.0 - 8.3 g/dL   Albumin 3.4 (L) 3.5 - 5.2 g/dL   GFR 21.30 (L) >86.57 mL/min   Calcium 9.5 8.4 - 10.5 mg/dL        Assessment & Plan:   Primary hypertension -     Losartan Potassium; Take 1 tablet (50 mg total) by mouth daily.  Dispense:  30 tablet; Refill: 1 -     CBC with Differential/Platelet -     Comprehensive metabolic panel  Gastrointestinal hemorrhage, unspecified gastrointestinal hemorrhage type -     CBC with Differential/Platelet -     Comprehensive metabolic panel     Meds ordered this encounter  Medications   losartan (COZAAR) 50 MG tablet    Sig: Take 1 tablet (50 mg total) by mouth daily.    Dispense:  30 tablet    Refill:  1    Return in about 4 weeks (around 04/09/2024) for meds, BP.  Moshe Cipro, FNP

## 2024-03-12 NOTE — Patient Instructions (Addendum)
 Continue almodipine daily   Will increase losartan 50mg  when systolic BP is higher than 160.  May use daily if needed.  Check BP prior to giving losartan daily.  We are checking labs today, will be in contact with any results that require further attention  Please follow up with Dr Macario Golds or me in about 3-4 weeks.

## 2024-03-12 NOTE — Telephone Encounter (Signed)
 Copied from CRM [id number]. Topic: Clinical - Red Word Triage >> Mar 12, 2024  9:16 AM Alphonzo Lemmings O wrote: [facility] that prompted transfer to Nurse Triage: blood pressure been 180/75 yesterday 200/75 now she is taking the lasartan . alan Styles   Chief Complaint: Blood Pressure-High Symptoms: None Frequency: Acute, 3 Days Pertinent Negatives: Patient denies chest pain, dyspnea, neurological deficit, or headaches Disposition: [] ED /[] Urgent Care (no appt availability in office) / [x] Appointment(In office/virtual)/ []  [facility]/ [] Home Care/ [] Refused Recommended Disposition /[] [facility]/ []  Follow-up with PCP  Additional Notes: LH is being triaged for elevated blood pressure readings. Spoke with her son for triage, the patient has had consistently high blood pressure readings for the past couple of days ranging from 160 - 200 Systolic. The patient is taking blood pressure medications currently, has a home health nurse that visits as well. The patient is blind, has a history of high blood pressure, but the recent elevations are acute. No availability in office with PCP, scheduled with another provider for an ACUTE issue. Patient agreed to disposition, verbalized understanding.   Reason for Disposition . [1] Systolic BP  >= 200 OR Diastolic >= 120 AND [2] having NO cardiac or neurologic symptoms  Answer Assessment - Initial Assessment Questions 1. BLOOD PRESSURE: "What is the blood pressure?" "Did you take at least two measurements 5 minutes apart?"     200/95, 206/97  2. ONSET: "When did you take your blood pressure?"     This morning   3. HOW: "How did you take your blood pressure?" (e.g., automatic home BP monitor, visiting nurse)     Automatic Home Monitor  4. HISTORY: "Do you have a history of high blood pressure?"     Yes  5. MEDICINES: "Are you taking any medicines for blood pressure?" "Have you missed any doses recently?"     Yes taking medicines, no missed  doses.   6. OTHER SYMPTOMS: "Do you have any symptoms?" (e.g., blurred vision, chest pain, difficulty breathing, headache, weakness)     No  7. PREGNANCY: "Is there any chance you are pregnant?" "When was your last menstrual period?"     No and No  Protocols used: Blood Pressure - High-A-AH

## 2024-03-13 ENCOUNTER — Encounter: Payer: Self-pay | Admitting: Family Medicine

## 2024-03-13 ENCOUNTER — Other Ambulatory Visit: Payer: Self-pay

## 2024-03-13 DIAGNOSIS — D696 Thrombocytopenia, unspecified: Secondary | ICD-10-CM

## 2024-03-13 DIAGNOSIS — K922 Gastrointestinal hemorrhage, unspecified: Secondary | ICD-10-CM

## 2024-03-13 NOTE — Progress Notes (Signed)
 Referral placed.

## 2024-03-17 ENCOUNTER — Telehealth: Payer: Self-pay | Admitting: Internal Medicine

## 2024-03-17 DIAGNOSIS — I130 Hypertensive heart and chronic kidney disease with heart failure and stage 1 through stage 4 chronic kidney disease, or unspecified chronic kidney disease: Secondary | ICD-10-CM | POA: Diagnosis not present

## 2024-03-17 DIAGNOSIS — D120 Benign neoplasm of cecum: Secondary | ICD-10-CM | POA: Diagnosis not present

## 2024-03-17 DIAGNOSIS — D62 Acute posthemorrhagic anemia: Secondary | ICD-10-CM | POA: Diagnosis not present

## 2024-03-17 DIAGNOSIS — K317 Polyp of stomach and duodenum: Secondary | ICD-10-CM | POA: Diagnosis not present

## 2024-03-17 DIAGNOSIS — D630 Anemia in neoplastic disease: Secondary | ICD-10-CM | POA: Diagnosis not present

## 2024-03-17 DIAGNOSIS — E870 Hyperosmolality and hypernatremia: Secondary | ICD-10-CM | POA: Diagnosis not present

## 2024-03-17 DIAGNOSIS — L89151 Pressure ulcer of sacral region, stage 1: Secondary | ICD-10-CM | POA: Diagnosis not present

## 2024-03-17 DIAGNOSIS — I5032 Chronic diastolic (congestive) heart failure: Secondary | ICD-10-CM | POA: Diagnosis not present

## 2024-03-17 DIAGNOSIS — I10 Essential (primary) hypertension: Secondary | ICD-10-CM

## 2024-03-17 DIAGNOSIS — K5730 Diverticulosis of large intestine without perforation or abscess without bleeding: Secondary | ICD-10-CM | POA: Diagnosis not present

## 2024-03-17 NOTE — Telephone Encounter (Signed)
 Copied from CRM [id number]. Topic: Clinical - Medical Advice >> Mar 17, 2024 10:08 AM Adaysia C wrote: Reason for CRM: Kimberly(nurse with [facility]) called to inform the provider that the patients BP is 182/78 and her heart rate has been trending down over the last week it stays between 47-50; her blood pressure medicine was increased but the BP top number is still running between 182-168; please follow up with nurse if necessary [phone]

## 2024-03-18 ENCOUNTER — Ambulatory Visit: Admitting: Physician Assistant

## 2024-03-18 ENCOUNTER — Encounter: Payer: Self-pay | Admitting: Physician Assistant

## 2024-03-18 VITALS — BP 174/90 | HR 64 | Ht 62.0 in | Wt 107.8 lb

## 2024-03-18 DIAGNOSIS — K5909 Other constipation: Secondary | ICD-10-CM

## 2024-03-18 DIAGNOSIS — Z8719 Personal history of other diseases of the digestive system: Secondary | ICD-10-CM

## 2024-03-18 NOTE — Patient Instructions (Signed)
 Use Fiber supplement to get 25-35 grams daily  Take OTC Miralax daily  Follow up with your PCP to check hemoglobin every 2-3 weeks   _______________________________________________________  If your blood pressure at your visit was 140/90 or greater, please contact your primary care physician to follow up on this.  _______________________________________________________  If you are age 88 or older, your body mass index should be between 23-30. Your Body mass index is 19.72 kg/m. If this is out of the aforementioned range listed, please consider follow up with your Primary Care Provider.  If you are age 79 or younger, your body mass index should be between 19-25. Your Body mass index is 19.72 kg/m. If this is out of the aformentioned range listed, please consider follow up with your Primary Care Provider.   ________________________________________________________  The [facility] providers would like to encourage you to use [facility] to communicate with providers for non-urgent requests or questions.  Due to long hold times on the telephone, sending your provider a message by [facility] may be a faster and more efficient way to get a response.  Please allow 48 business hours for a response.  Please remember that this is for non-urgent requests.  _______________________________________________________   I appreciate the  opportunity to care for you  Thank You   Jacelyn Grip

## 2024-03-18 NOTE — Progress Notes (Signed)
 Chief Complaint: Follow-up hospitalization for symptomatic anemia  HPI:    Breanna [name] is a 89 year old female with a past medical history as listed below including hypertension, diabetes, CKD 3, CVA (on aspirin 81 mg), gout, CHF, blindness, sigmoid colectomy 2006 and multiple others, previously known to Dr. Dickie La and assigned to Dr. Barron Alvine during recent hospitalization, who presents to clinic today for follow-up after hospitalization for symptomatic anemia.    01/18/2024 patient seen in hospital by her service for symptomatic anemia with syncope.  In the ER found to have a hemoglobin of 7.3--> 6.2.  She was transfused.  To have heme positive stool.  Folate 18.8.  EGD recommended initially.    01/19/2024 EGD with multiple gastric polyps and otherwise normal.  Recommended colonoscopy the next day.    01/20/2024 colonoscopy with diverticulosis in the entire examined colon with no active site of bleeding identified, exam is otherwise normal except for diminutive cecal polyp which was not removed and prior sigmoid resection.  At that time if obvious ongoing bleeding recommended a CTA.    01/26/2024 where patient last saw the patient in the hospital.  At that time discussed she had acute GI bleed felt to be diverticular based on EGD and colonoscopy findings.  He required several blood transfusions.  Her hemoglobin was 7.2--> 8.2 overnight.  She was discharged the next day.    03/12/2024 CBC with a hemoglobin of 9.7.  (9.6 on 01/30/2024).  MCV normal.  CMP with minimally elevated BUN at 26.  Otherwise normal.    Today, patient presents to clinic accompanied by her daughter and son who are very involved with her care.  The patient herself is blind.  She lives mostly at home by herself.  Tells me that she has not noticed any more bleeding but would not really be able to tell.  In general though she feels fairly well and does not feel fatigued or short of breath which is the telltale sign of her anemia per her family.   She did have labs rechecked as above and things look stable over the past month and a half.  Family had many questions about what to do next.  They do tell me that she is chronically constipated and wonder what to do for this.    Does spend time discussing her hypertension, her PCP is working on this per their notes.    Denies fever, chills or weight loss.  Past Medical History:  Diagnosis Date   Adrenal adenoma 12/24/2004   Boils 12/25/2007   Cholelithiasis    asympt. w/normal HIDA 03/2010   CVA (cerebral infarction) 12/24/2008   Cerebellar   Diverticulitis    Esophageal spasm 12/24/2009   GERD (gastroesophageal reflux disease)    Gout    History of colon polyps    HTN (hypertension)    Hydronephrosis    LEFT/ Surgical intervention   Hyperlipidemia    LBP (low back pain)    Osteoarthritis    Pulmonary HTN (HCC)    Stress    Type II or unspecified type diabetes mellitus without mention of complication, not stated as uncontrolled     Past Surgical History:  Procedure Laterality Date   ABDOMINAL HYSTERECTOMY     APPENDECTOMY     2020   BACK SURGERY     BREAST BIOPSY     RIGHT   CATARACT EXTRACTION, BILATERAL     COLECTOMY  2006   Sigmoid   COLONOSCOPY Left 01/20/2024   Procedure: COLONOSCOPY;  Surgeon: Hilarie Fredrickson, MD;  Location: [facility];  Service: Gastroenterology;  Laterality: Left;   ESOPHAGOGASTRODUODENOSCOPY Left 01/19/2024   Procedure: ESOPHAGOGASTRODUODENOSCOPY (EGD);  Surgeon: Shellia Cleverly, DO;  Location: [facility];  Service: Gastroenterology;  Laterality: Left;   FOOT SURGERY     BILATERAL   HAMMER TOE SURGERY     INTRAMEDULLARY (IM) NAIL INTERTROCHANTERIC Right 07/21/2020   Procedure: INTRAMEDULLARY (IM) NAIL INTERTROCHANTRIC;  Surgeon: Sheral Apley, MD;  Location: [facility] OR;  Service: Orthopedics;  Laterality: Right;   ROTATOR CUFF REPAIR  2008   RIGHT   TOTAL KNEE ARTHROPLASTY  2003   LEFT   VARICOSE VEIN SURGERY     vein stripping/lower  extremities    Current Outpatient Medications  Medication Sig Dispense Refill   acetaminophen (TYLENOL) 325 MG tablet Take 2 tablets (650 mg total) by mouth every 6 (six) hours as needed for moderate pain or fever.     allopurinol (ZYLOPRIM) 100 MG tablet Take 0.5 tablets (50 mg total) by mouth daily. 90 tablet 1   amLODipine (NORVASC) 5 MG tablet Take 1 tablet (5 mg total) by mouth daily. 90 tablet 3   atropine 1 % ophthalmic solution Place 1 drop into both eyes daily.     brimonidine (ALPHAGAN) 0.2 % ophthalmic solution Place 1 drop into the right eye 2 (two) times daily.     [employer] VITAMIN B12 1000 MCG tablet TAKE 1 TABLET (1,000 MCG TOTAL) BY MOUTH EVERY DAY 90 tablet 3   dorzolamide-timolol (COSOPT) 22.3-6.8 MG/ML ophthalmic solution Place 1 drop into both eyes 2 (two) times daily.      feeding supplement, ENSURE ENLIVE, (ENSURE ENLIVE) LIQD Take 237 mLs by mouth daily at 2 PM. 237 mL 12   gabapentin (NEURONTIN) 100 MG capsule Take 1 capsule (100 mg total) by mouth 2 (two) times daily. 180 capsule 5   glucose blood (GNP TRUE METRIX GLUCOSE STRIPS) test strip Use to check blood sugars twice a day 200 each 2   latanoprost (XALATAN) 0.005 % ophthalmic solution Place 1 drop into both eyes at bedtime.      losartan (COZAAR) 50 MG tablet Take 1 tablet (50 mg total) by mouth daily. 30 tablet 1   Netarsudil-Latanoprost 0.02-0.005 % SOLN Apply to eye.     NON FORMULARY Place 10 drops under the tongue daily as needed (Pain). Medication: CBD oil     pantoprazole (PROTONIX) 40 MG tablet TAKE 1 TABLET BY MOUTH EVERY DAY 90 tablet 3   rosuvastatin (CRESTOR) 5 MG tablet Take 5 mg by mouth daily.     senna (SENOKOT) 8.6 MG TABS tablet Take 1 tablet (8.6 mg total) by mouth 2 (two) times daily. 120 tablet 0   sertraline (ZOLOFT) 100 MG tablet Take 1 tablet (100 mg total) by mouth at bedtime. 90 tablet 1   No current facility-administered medications for this visit.   Facility-Administered Medications  Ordered in Other Visits  Medication Dose Route Frequency Provider Last Rate Last Admin   regadenoson (LEXISCAN) injection SOLN 0.4 mg  0.4 mg Intravenous Once Meriam Sprague, MD       technetium tetrofosmin (TC-MYOVIEW) injection 31.1 millicurie  31.1 millicurie Intravenous Once PRN Meriam Sprague, MD        Allergies as of 03/18/2024 - Review Complete 03/13/2024  Allergen Reaction Noted   Verapamil Shortness Of Breath 01/06/2009   Aspirin Itching    Atenolol  01/02/2010   Codeine Itching 04/13/2015   Codeine sulfate  Hydrochlorothiazide  01/02/2010   Hydrocodone  06/04/2016   Ibuprofen     Lisinopril  11/16/2008   Onion Other (See Comments) 04/13/2015   Shellfish allergy Swelling 07/21/2020   Statins  04/11/2023   Valsartan Itching    Adhesive  [tape] Rash 04/13/2015   Other Rash 04/14/2021    Family History  Problem Relation Age of Onset   Prostate cancer Father    Hypertension Father    Cancer Father        prostate   Heart disease Mother    Diabetes Mother    Diabetes Other        1st degree relative   Heart disease Other    Hypertension Other    Prostate cancer Maternal Uncle    Cancer Maternal Uncle        prostate   Breast cancer Daughter    Cancer Daughter        breast   Colon cancer Neg Hx     Social History   Socioeconomic History   Marital status: Widowed    Spouse name: Jonay Hitchcock   Number of children: 1   Years of education: Not on file   Highest education level: 12th grade  Occupational History   Occupation: Retired    Associate Professor: RETIRED  Tobacco Use   Smoking status: Never   Smokeless tobacco: Never  Vaping Use   Vaping status: Never Used  Substance and Sexual Activity   Alcohol use: No    Alcohol/week: 0.0 standard drinks of alcohol   Drug use: No   Sexual activity: Not Currently  Other Topics Concern   Not on file  Social History Narrative   Lives with son.   Social Drivers of Corporate investment banker  Strain: Low Risk  (02/12/2024)   Overall Financial Resource Strain (CARDIA)    Difficulty of Paying Living Expenses: Not hard at all  Food Insecurity: No Food Insecurity (02/12/2024)   Hunger Vital Sign    Worried About Running Out of Food in the Last Year: Never true    Ran Out of Food in the Last Year: Never true  Transportation Needs: Unmet Transportation Needs (02/12/2024)   PRAPARE - Transportation    Lack of Transportation (Medical): Yes    Lack of Transportation (Non-Medical): Yes  Physical Activity: Inactive (02/12/2024)   Exercise Vital Sign    Days of Exercise per Week: 0 days    Minutes of Exercise per Session: 0 min  Stress: No Stress Concern Present (02/12/2024)   [employer] of Occupational Health - Occupational Stress Questionnaire    Feeling of Stress : Only a little  Social Connections: Moderately Integrated (02/12/2024)   Social Connection and Isolation Panel [NHANES]    Frequency of Communication with Friends and Family: More than three times a week    Frequency of Social Gatherings with Friends and Family: More than three times a week    Attends Religious Services: More than 4 times per year    Active Member of [employer] or Organizations: Yes    Attends Banker Meetings: Patient declined    Marital Status: Widowed  Intimate Partner Violence: Patient Unable To Answer (01/29/2024)   Humiliation, Afraid, Rape, and Kick questionnaire    Fear of Current or Ex-Partner: Patient unable to answer    Emotionally Abused: Patient unable to answer    Physically Abused: Patient unable to answer    Sexually Abused: Patient unable to answer    Review of Systems:  Constitutional: No weight loss, fever or chills Cardiovascular: No chest pain Respiratory: No SOB  Gastrointestinal: See HPI and otherwise negative   Physical Exam:  Vital signs: BP (!) 174/90   Pulse 64   Ht 5\' 2"  (1.575 m)   Wt 107 lb 12.8 oz (48.9 kg)   BMI 19.72 kg/m    Constitutional:    Pleasant elderly AA female appears to be in NAD, Well developed, Well nourished, alert and cooperative Head:  Normocephalic and atraumatic. Eyes:  Blind Ears:  Normal auditory acuity. Neck:  Supple Throat: Oral cavity and pharynx without inflammation, swelling or lesion.  Respiratory: Respirations even and unlabored. Lungs clear to auscultation bilaterally.   No wheezes, crackles, or rhonchi.  Cardiovascular: Normal S1, S2. No MRG. Regular rate and rhythm. No peripheral edema, cyanosis or pallor.  Gastrointestinal:  Soft, nondistended, nontender. No rebound or guarding. Normal bowel sounds. No appreciable masses or hepatomegaly. Rectal:  Not performed.  Psychiatric: Oriented to person, place and time. Demonstrates good judgement and reason without abnormal affect or behaviors.  RELEVANT LABS AND IMAGING: CBC    Component Value Date/Time   WBC 3.8 (L) 03/12/2024 1441   RBC 3.38 (L) 03/12/2024 1441   HGB 9.7 (L) 03/12/2024 1441   HCT 29.6 (L) 03/12/2024 1441   PLT 123.0 (L) 03/12/2024 1441   MCV 87.7 03/12/2024 1441   MCH 29.1 01/28/2024 0548   MCHC 32.6 03/12/2024 1441   RDW 15.9 (H) 03/12/2024 1441   LYMPHSABS 1.0 03/12/2024 1441   MONOABS 0.4 03/12/2024 1441   EOSABS 0.1 03/12/2024 1441   BASOSABS 0.0 03/12/2024 1441    CMP     Component Value Date/Time   NA 140 03/12/2024 1441   K 4.3 03/12/2024 1441   CL 106 03/12/2024 1441   CO2 29 03/12/2024 1441   GLUCOSE 70 03/12/2024 1441   GLUCOSE 87 01/06/2007 1521   BUN 26 (H) 03/12/2024 1441   CREATININE 1.17 03/12/2024 1441   CREATININE 1.35 (H) 08/18/2020 1430   CALCIUM 9.5 03/12/2024 1441   PROT 6.6 03/12/2024 1441   ALBUMIN 3.4 (L) 03/12/2024 1441   AST 16 03/12/2024 1441   ALT 7 03/12/2024 1441   ALKPHOS 86 03/12/2024 1441   BILITOT 0.3 03/12/2024 1441   GFRNONAA 60 (L) 01/26/2024 0538   GFRNONAA 35 (L) 08/18/2020 1430   GFRAA 41 (L) 08/18/2020 1430    Assessment: 1.  History of GI bleed and symptomatic anemia:  Required hospitalization recently underwent EGD and colonoscopy with no source, diverticular bleed thought etiology, hemoglobin stable since discharge  2.  Chronic constipation: Likely due to age  Plan: 1.  Recommend the patient start a fiber supplement such as Metamucil, Citrucel or Benefiber.  We discussed recommendation of 25 to 35 g/day through diet and supplement 2.  Started on MiraLAX daily.  Discussed titration of this. 3.  Patient to continue to follow with PCP to monitor hemoglobin every 3-4 weeks given that she is blind and cannot tell if she is losing blood or not. 4.  Spent time answering all the family's questions.   5.  Patient following clinic as needed.  Hyacinth Meeker, PA-C [facility] 03/18/2024, 9:03 AM  Cc: Tresa Garter, MD

## 2024-03-18 NOTE — Progress Notes (Signed)
 Agree with the assessment and plan as outlined by Hyacinth Meeker, PA-C. ? ?Orman Matsumura, DO, FACG ? ?

## 2024-03-19 MED ORDER — LOSARTAN POTASSIUM 50 MG PO TABS: 50.0000 mg | ORAL_TABLET | Freq: Two times a day (BID) | ORAL | 5 refills | Status: DC

## 2024-03-19 NOTE — Telephone Encounter (Signed)
 Noted.  Try losartan 50 mg twice a day.  New prescription was sent to the pharmacy.  Thank you

## 2024-03-20 DIAGNOSIS — D630 Anemia in neoplastic disease: Secondary | ICD-10-CM | POA: Diagnosis not present

## 2024-03-20 DIAGNOSIS — I5032 Chronic diastolic (congestive) heart failure: Secondary | ICD-10-CM | POA: Diagnosis not present

## 2024-03-20 DIAGNOSIS — D120 Benign neoplasm of cecum: Secondary | ICD-10-CM | POA: Diagnosis not present

## 2024-03-20 DIAGNOSIS — E870 Hyperosmolality and hypernatremia: Secondary | ICD-10-CM | POA: Diagnosis not present

## 2024-03-20 DIAGNOSIS — I130 Hypertensive heart and chronic kidney disease with heart failure and stage 1 through stage 4 chronic kidney disease, or unspecified chronic kidney disease: Secondary | ICD-10-CM | POA: Diagnosis not present

## 2024-03-20 DIAGNOSIS — K5730 Diverticulosis of large intestine without perforation or abscess without bleeding: Secondary | ICD-10-CM | POA: Diagnosis not present

## 2024-03-20 DIAGNOSIS — L89151 Pressure ulcer of sacral region, stage 1: Secondary | ICD-10-CM | POA: Diagnosis not present

## 2024-03-20 DIAGNOSIS — D62 Acute posthemorrhagic anemia: Secondary | ICD-10-CM | POA: Diagnosis not present

## 2024-03-20 DIAGNOSIS — K317 Polyp of stomach and duodenum: Secondary | ICD-10-CM | POA: Diagnosis not present

## 2024-03-24 ENCOUNTER — Ambulatory Visit (INDEPENDENT_AMBULATORY_CARE_PROVIDER_SITE_OTHER): Admitting: Licensed Clinical Social Worker

## 2024-03-24 DIAGNOSIS — D62 Acute posthemorrhagic anemia: Secondary | ICD-10-CM | POA: Diagnosis not present

## 2024-03-24 DIAGNOSIS — K317 Polyp of stomach and duodenum: Secondary | ICD-10-CM | POA: Diagnosis not present

## 2024-03-24 DIAGNOSIS — I130 Hypertensive heart and chronic kidney disease with heart failure and stage 1 through stage 4 chronic kidney disease, or unspecified chronic kidney disease: Secondary | ICD-10-CM | POA: Diagnosis not present

## 2024-03-24 DIAGNOSIS — L89151 Pressure ulcer of sacral region, stage 1: Secondary | ICD-10-CM | POA: Diagnosis not present

## 2024-03-24 DIAGNOSIS — D120 Benign neoplasm of cecum: Secondary | ICD-10-CM | POA: Diagnosis not present

## 2024-03-24 DIAGNOSIS — K5730 Diverticulosis of large intestine without perforation or abscess without bleeding: Secondary | ICD-10-CM | POA: Diagnosis not present

## 2024-03-24 DIAGNOSIS — H5316 Psychophysical visual disturbances: Secondary | ICD-10-CM

## 2024-03-24 DIAGNOSIS — I5032 Chronic diastolic (congestive) heart failure: Secondary | ICD-10-CM | POA: Diagnosis not present

## 2024-03-24 DIAGNOSIS — D630 Anemia in neoplastic disease: Secondary | ICD-10-CM | POA: Diagnosis not present

## 2024-03-24 DIAGNOSIS — F0630 Mood disorder due to known physiological condition, unspecified: Secondary | ICD-10-CM | POA: Diagnosis not present

## 2024-03-24 DIAGNOSIS — E870 Hyperosmolality and hypernatremia: Secondary | ICD-10-CM | POA: Diagnosis not present

## 2024-03-24 NOTE — Progress Notes (Signed)
 [facility] Counselor/Therapist Progress Note  Patient ID: Breanna [name], MRN: [id number]    Date: 03/24/24  Time Spent: 11:02  am - 12:04pm : 62 Minutes  Treatment Type: Individual Therapy.  Reported Symptoms: had been in a mood, reclusive and not going out due to the cold weather and crowd of people getting sick, very optimistic and hopeful, has a good outlook   Mental Status Exam: Appearance:  Well Groomed     Behavior: Appropriate  Motor: Normal  Speech/Language:  Normal Rate  Affect: Appropriate  Mood: normal  Thought process: normal  Thought content:   WNL  Sensory/Perceptual disturbances:   WNL  Orientation: oriented to person, place, and time/date  Attention: Good  Concentration: Good  Memory: WNL  Fund of knowledge:  Good  Insight:   Good  Judgment:  Good  Impulse Control: Good   Risk Assessment: Danger to Self:  No Self-injurious Behavior: No Danger to Others: No Duty to Warn:no Physical Aggression / Violence:No  Access to Firearms a concern: No  Gang Involvement:No   Subjective:   [employer] participated in the session, in person in the office with the therapist, and consented to treatment. Janessa reviewed the events of the past week.      Interventions: Mindfulness Meditation and Narrative  Diagnosis:   Jacqulyn Liner Syndrome  Psychiatric Treatment: No , N/A  Treatment Plan:  Client Abilities/Strengths Akshaya is open and receptive to sessions.  Support System: Family and Friends  Client Treatment Preferences In person  Client Statement of Needs [name] would like to focus on using the talk space to decompress and process her emotions after going blind.    Treatment Level Monthly  Symptoms  Grateful   (Status: improved) Hopeful   (Status: improved)  Goals:   Breanna [name] experiences symptoms of feeling out of sorts a bit about not being able to see.  Her sister came down from [place] to visit and has been experiencing.  Has not  followed up on the blind services, audio books we discussed, etc.  Will follow up with family as she has been having.  She is focused on being grateful and optimistic.  Does not want to focus on having a pity party about the loss of loved ones and the lost of her site.  In overall good sprits and adjusting the best she can.     Target Date: 03/24/24 Frequency: Monthly  Progress: 0 Modality: individual    Therapist will provide referrals for additional resources as appropriate.  Therapist will provide psycho-education regarding Breanna [name]'s diagnosis and corresponding treatment approaches and interventions. Licensed Clinical Mental Health Counselor, Anselmo Pickler, [facility] will support the patient's ability to achieve the goals identified. will employ CBT, BA, Problem-solving, Solution Focused, Mindfulness,  coping skills, & other evidenced-based practices will be used to promote progress towards healthy functioning to help manage decrease symptoms associated with her diagnosis.   Reduce overall level, frequency, and intensity of the feelings of depression, anxiety and panic evidenced by decreased feeling of being alone and isolation with the changes to her lifestyle.   Verbally express understanding of the relationship between feelings of depression, anxiety and their impact on thinking patterns and behaviors. Verbalize an understanding of the role that distorted thinking plays in creating fears, excessive worry, and ruminations.  [name] participated in the creation of the treatment plan)      Anselmo Pickler, [facility]

## 2024-03-25 DIAGNOSIS — Z961 Presence of intraocular lens: Secondary | ICD-10-CM | POA: Diagnosis not present

## 2024-03-25 DIAGNOSIS — H401133 Primary open-angle glaucoma, bilateral, severe stage: Secondary | ICD-10-CM | POA: Diagnosis not present

## 2024-03-25 DIAGNOSIS — Z947 Corneal transplant status: Secondary | ICD-10-CM | POA: Diagnosis not present

## 2024-03-25 NOTE — Telephone Encounter (Signed)
 Attempted to call back and had to leave a voice message

## 2024-03-30 ENCOUNTER — Ambulatory Visit: Payer: Medicare PPO | Admitting: Podiatry

## 2024-03-31 ENCOUNTER — Ambulatory Visit: Payer: Medicare PPO | Admitting: Podiatry

## 2024-03-31 ENCOUNTER — Encounter: Payer: Self-pay | Admitting: Podiatry

## 2024-03-31 DIAGNOSIS — L84 Corns and callosities: Secondary | ICD-10-CM

## 2024-03-31 DIAGNOSIS — M79674 Pain in right toe(s): Secondary | ICD-10-CM | POA: Diagnosis not present

## 2024-03-31 DIAGNOSIS — B351 Tinea unguium: Secondary | ICD-10-CM | POA: Diagnosis not present

## 2024-03-31 DIAGNOSIS — M79675 Pain in left toe(s): Secondary | ICD-10-CM | POA: Diagnosis not present

## 2024-03-31 DIAGNOSIS — E1151 Type 2 diabetes mellitus with diabetic peripheral angiopathy without gangrene: Secondary | ICD-10-CM

## 2024-04-01 DIAGNOSIS — D630 Anemia in neoplastic disease: Secondary | ICD-10-CM | POA: Diagnosis not present

## 2024-04-01 DIAGNOSIS — E870 Hyperosmolality and hypernatremia: Secondary | ICD-10-CM | POA: Diagnosis not present

## 2024-04-01 DIAGNOSIS — I130 Hypertensive heart and chronic kidney disease with heart failure and stage 1 through stage 4 chronic kidney disease, or unspecified chronic kidney disease: Secondary | ICD-10-CM | POA: Diagnosis not present

## 2024-04-01 DIAGNOSIS — Z947 Corneal transplant status: Secondary | ICD-10-CM | POA: Diagnosis not present

## 2024-04-01 DIAGNOSIS — K317 Polyp of stomach and duodenum: Secondary | ICD-10-CM | POA: Diagnosis not present

## 2024-04-01 DIAGNOSIS — K5730 Diverticulosis of large intestine without perforation or abscess without bleeding: Secondary | ICD-10-CM | POA: Diagnosis not present

## 2024-04-01 DIAGNOSIS — I5032 Chronic diastolic (congestive) heart failure: Secondary | ICD-10-CM | POA: Diagnosis not present

## 2024-04-01 DIAGNOSIS — D120 Benign neoplasm of cecum: Secondary | ICD-10-CM | POA: Diagnosis not present

## 2024-04-01 DIAGNOSIS — L89151 Pressure ulcer of sacral region, stage 1: Secondary | ICD-10-CM | POA: Diagnosis not present

## 2024-04-01 DIAGNOSIS — Z961 Presence of intraocular lens: Secondary | ICD-10-CM | POA: Diagnosis not present

## 2024-04-01 DIAGNOSIS — D62 Acute posthemorrhagic anemia: Secondary | ICD-10-CM | POA: Diagnosis not present

## 2024-04-01 DIAGNOSIS — H401133 Primary open-angle glaucoma, bilateral, severe stage: Secondary | ICD-10-CM | POA: Diagnosis not present

## 2024-04-02 ENCOUNTER — Telehealth: Payer: Self-pay | Admitting: Internal Medicine

## 2024-04-02 NOTE — Telephone Encounter (Signed)
 Copied from CRM [id number]. Topic: Referral - Request for Referral >> Apr 02, 2024 11:53 AM Cammy Copa D wrote: Did the patient discuss referral with their provider in the last year? Yes (If No - schedule appointment) (If Yes - send message)  Appointment offered? Yes  Type of order/referral and detailed reason for visit: Cardiologist: Cala Bradford, Nurse with [facility], wanted to see if referral would be appropriate, patients blood pressure still high with top number over 180, top number was 182 yesterday 4/9 and the bottom number is usually in the 80s. Her heart rate ranges from 49-54 wondering if they can get a referral to a cardiologist. Condition regressed over past 6 weeks with medications not helping too much .   Preference of office, provider, location: N/A  If referral order, have you been seen by this specialty before? No (If Yes, this issue or another issue? When? Where?  Can we respond through MyChart? No

## 2024-04-05 NOTE — Progress Notes (Signed)
 Subjective:  Patient ID: Breanna [name], female    DOB: 09/14/1931,  MRN: [id number]  89 y.o. female presents at risk foot care with history of diabetic neuropathy and callus(es) right foot and painful thick toenails that are difficult to trim. Painful toenails interfere with ambulation. Aggravating factors include wearing enclosed shoe gear. Pain is relieved with periodic professional debridement. Painful calluses are aggravated when weightbearing with and without shoegear. Pain is relieved with periodic professional debridement. Chief Complaint  Patient presents with   Diabetes    Patient states her last A1c was a 6 and she last seen her PCP about a month ago, patient states her PCP name is Dr. Georgia Kipper    New problem(s): None   PCP is Plotnikov, Oakley Bellman, MD. Allergies  Allergen Reactions   Verapamil Shortness Of Breath    REACTION: SOB   Aspirin Itching    But tolerates low dose   Atenolol     REACTION: fatigue   Codeine Itching   Codeine Sulfate    Hydrochlorothiazide     REACTION: gout   Hydrocodone       side effects - hallucinations   Ibuprofen     Upset stomach w/high doses   Lisinopril     REACTION: cough   Onion Other (See Comments)    Dry mouth/ gets sores   Shellfish Allergy Swelling    Patient stated she does not eat shellfish, she swells up on different parts of the body   Statins     Dizzy, falls   Valsartan Itching   Adhesive  [Tape] Rash   Other Rash    Review of Systems: Negative except as noted in the HPI.   Objective:  Catelyn Friel is a pleasant 89 y.o. female in NAD. AAO x 3.  Vascular Examination: CFT <3 seconds b/l LE. Palpable DP pulse(s) b/l LE. Palpable PT pulse(s) b/l LE. Pedal hair sparse. No pain with calf compression b/l. Lower extremity skin temperature gradient within normal limits. No edema noted b/l LE. No cyanosis or clubbing noted b/l LE.  Dermatological Examination: Pedal skin is warm and supple b/l LE. No open wounds b/l  LE. No interdigital macerations noted b/l LE.   Toenails 3-5 bilaterally, L hallux, L 2nd toe, and R hallux elongated, discolored, dystrophic, thickened, and crumbly with subungual debris and tenderness to dorsal palpation. Anonychia noted right second digit. Nailbed(s) epithelialized.    Hyperkeratotic lesion(s) right heel. No erythema, no edema, no drainage, no fluctuance.  Neurological Examination: Pt has subjective symptoms of neuropathy. Protective sensation intact 5/5 intact bilaterally with 10g monofilament b/l. Vibratory sensation intact b/l.  Musculoskeletal Examination: Muscle strength 5/5 to all lower extremity muscle groups bilaterally. Limited joint ROM to the left ankle and right ankle. Pes planus deformity noted bilateral LE.  Last A1c:      Latest Ref Rng & Units 09/11/2023    9:15 AM  Hemoglobin A1C  Hemoglobin-A1c 4.6 - 6.5 % 6.8    Assessment:   1. Pain due to onychomycosis of toenails of both feet   2. Callus of foot   3. Type II diabetes mellitus with peripheral circulatory disorder [facility])    Plan:  Patient was evaluated and treated. All patient's and/or POA's questions/concerns addressed on today's visit. Toenails left great toe, left 2nd toe and 3-5 debrided in length and girth without incident. Callus(es) right heel pared with sharp debridement without incident. Continue soft, supportive shoe gear daily. Report any pedal injuries to medical professional. Call  office if there are any questions/concerns. -Patient/POA to call should there be question/concern in the interim.  Return in about 3 months (around 06/30/2024).  Luella Sager, DPM      [facility] LOCATION: [address] [place] [address]                   Office [phone]   [facility] LOCATION: [address] [place], [place] [address] Office [phone]

## 2024-04-08 ENCOUNTER — Other Ambulatory Visit: Payer: Self-pay | Admitting: Family

## 2024-04-08 DIAGNOSIS — I10 Essential (primary) hypertension: Secondary | ICD-10-CM

## 2024-04-13 DIAGNOSIS — K317 Polyp of stomach and duodenum: Secondary | ICD-10-CM | POA: Diagnosis not present

## 2024-04-13 DIAGNOSIS — D120 Benign neoplasm of cecum: Secondary | ICD-10-CM | POA: Diagnosis not present

## 2024-04-13 DIAGNOSIS — E1122 Type 2 diabetes mellitus with diabetic chronic kidney disease: Secondary | ICD-10-CM | POA: Diagnosis not present

## 2024-04-13 DIAGNOSIS — I083 Combined rheumatic disorders of mitral, aortic and tricuspid valves: Secondary | ICD-10-CM

## 2024-04-13 DIAGNOSIS — K5730 Diverticulosis of large intestine without perforation or abscess without bleeding: Secondary | ICD-10-CM | POA: Diagnosis not present

## 2024-04-13 DIAGNOSIS — I272 Pulmonary hypertension, unspecified: Secondary | ICD-10-CM

## 2024-04-13 DIAGNOSIS — I5032 Chronic diastolic (congestive) heart failure: Secondary | ICD-10-CM | POA: Diagnosis not present

## 2024-04-13 DIAGNOSIS — D630 Anemia in neoplastic disease: Secondary | ICD-10-CM

## 2024-04-13 DIAGNOSIS — I130 Hypertensive heart and chronic kidney disease with heart failure and stage 1 through stage 4 chronic kidney disease, or unspecified chronic kidney disease: Secondary | ICD-10-CM | POA: Diagnosis not present

## 2024-04-13 DIAGNOSIS — D62 Acute posthemorrhagic anemia: Secondary | ICD-10-CM | POA: Diagnosis not present

## 2024-04-13 DIAGNOSIS — D631 Anemia in chronic kidney disease: Secondary | ICD-10-CM | POA: Diagnosis not present

## 2024-04-13 DIAGNOSIS — N1832 Chronic kidney disease, stage 3b: Secondary | ICD-10-CM | POA: Diagnosis not present

## 2024-04-13 NOTE — Telephone Encounter (Signed)
 Called and informed Breanna [name] at [facility] of referral being placed. She expressed thanks and understanding

## 2024-04-16 ENCOUNTER — Other Ambulatory Visit: Payer: Self-pay | Admitting: Internal Medicine

## 2024-04-16 ENCOUNTER — Ambulatory Visit: Admitting: Internal Medicine

## 2024-04-16 ENCOUNTER — Encounter: Payer: Self-pay | Admitting: Internal Medicine

## 2024-04-16 VITALS — BP 145/70 | HR 51 | Temp 97.8°F | Ht 62.0 in

## 2024-04-16 DIAGNOSIS — R634 Abnormal weight loss: Secondary | ICD-10-CM | POA: Diagnosis not present

## 2024-04-16 DIAGNOSIS — Z7984 Long term (current) use of oral hypoglycemic drugs: Secondary | ICD-10-CM

## 2024-04-16 DIAGNOSIS — E1121 Type 2 diabetes mellitus with diabetic nephropathy: Secondary | ICD-10-CM | POA: Diagnosis not present

## 2024-04-16 DIAGNOSIS — D62 Acute posthemorrhagic anemia: Secondary | ICD-10-CM | POA: Diagnosis not present

## 2024-04-16 DIAGNOSIS — I10 Essential (primary) hypertension: Secondary | ICD-10-CM

## 2024-04-16 LAB — COMPREHENSIVE METABOLIC PANEL WITH GFR
ALT: 6 U/L (ref 0–35)
AST: 15 U/L (ref 0–37)
Albumin: 3.4 g/dL — ABNORMAL LOW (ref 3.5–5.2)
Alkaline Phosphatase: 85 U/L (ref 39–117)
BUN: 24 mg/dL — ABNORMAL HIGH (ref 6–23)
CO2: 28 meq/L (ref 19–32)
Calcium: 9 mg/dL (ref 8.4–10.5)
Chloride: 109 meq/L (ref 96–112)
Creatinine, Ser: 1.29 mg/dL — ABNORMAL HIGH (ref 0.40–1.20)
GFR: 36.51 mL/min — ABNORMAL LOW (ref >60.00)
Glucose, Bld: 93 mg/dL (ref 70–99)
Potassium: 4.5 meq/L (ref 3.5–5.1)
Sodium: 141 meq/L (ref 135–145)
Total Bilirubin: 0.3 mg/dL (ref 0.2–1.2)
Total Protein: 6.7 g/dL (ref 6.0–8.3)

## 2024-04-16 LAB — CBC WITH DIFFERENTIAL/PLATELET
Basophils Absolute: 0 10*3/uL (ref 0.0–0.1)
Basophils Relative: 0.1 % (ref 0.0–3.0)
Eosinophils Absolute: 0.1 10*3/uL (ref 0.0–0.7)
Eosinophils Relative: 2.4 % (ref 0.0–5.0)
HCT: 30 % — ABNORMAL LOW (ref 36.0–46.0)
Hemoglobin: 9.6 g/dL — ABNORMAL LOW (ref 12.0–15.0)
Lymphocytes Relative: 23.3 % (ref 12.0–46.0)
Lymphs Abs: 1 10*3/uL (ref 0.7–4.0)
MCHC: 31.9 g/dL (ref 30.0–36.0)
MCV: 86.8 fl (ref 78.0–100.0)
Monocytes Absolute: 0.4 10*3/uL (ref 0.1–1.0)
Monocytes Relative: 10.3 % (ref 3.0–12.0)
Neutro Abs: 2.8 10*3/uL (ref 1.4–7.7)
Neutrophils Relative %: 63.9 % (ref 43.0–77.0)
Platelets: 130 10*3/uL — ABNORMAL LOW (ref 150.0–400.0)
RBC: 3.45 Mil/uL — ABNORMAL LOW (ref 3.87–5.11)
RDW: 16.2 % — ABNORMAL HIGH (ref 11.5–15.5)
WBC: 4.4 10*3/uL (ref 4.0–10.5)

## 2024-04-16 NOTE — Assessment & Plan Note (Signed)
  GI bleed in Feb lead to syncope Check CBC

## 2024-04-16 NOTE — Progress Notes (Signed)
 Subjective:  Patient ID: Breanna [name], female    DOB: December 13, 1931  Age: 89 y.o. MRN: [id number]  CC: Hypertension (In home nursing is requiring another referral [facility]) for hypertension. Was not able to continue with physical therapy due to hypertension), Constipation (Currently treating with extra fruit intake. Notes constipation has been a problem since last hospital visit in January. When constipated patient is having some right abdominal pains ), and Generalized Body Aches (Notes ongoing issues with body pain, and joint pain. Works to make sure to stretch when possible)   HPI [employer] presents for Hypertension (In home nursing is requiring another referral [facility]) for hypertension. Was not able to continue with physical therapy due to hypertension), Constipation (Currently treating with extra fruit intake. Notes constipation has been a problem since last hospital visit in January. When constipated patient is having some right abdominal pains ), and Generalized Body Aches (Notes ongoing issues with body pain, and joint pain. Works to make sure to stretch when possible)  GI bleed in Feb lead to syncope   She is here w/her son   Outpatient Medications Prior to Visit  Medication Sig Dispense Refill   acetaminophen  (TYLENOL ) 325 MG tablet Take 2 tablets (650 mg total) by mouth every 6 (six) hours as needed for moderate pain or fever.     allopurinol  (ZYLOPRIM ) 100 MG tablet Take 0.5 tablets (50 mg total) by mouth daily. 90 tablet 1   amLODipine  (NORVASC ) 5 MG tablet Take 1 tablet (5 mg total) by mouth daily. 90 tablet 3   atropine  1 % ophthalmic solution Place 1 drop into both eyes daily.     brimonidine  (ALPHAGAN ) 0.2 % ophthalmic solution Place 1 drop into the right eye 2 (two) times daily.     [employer] VITAMIN B12 1000 MCG tablet TAKE 1 TABLET (1,000 MCG TOTAL) BY MOUTH EVERY DAY 90 tablet 3   dorzolamide -timolol  (COSOPT ) 22.3-6.8 MG/ML ophthalmic solution Place 1 drop into both eyes  2 (two) times daily.      feeding supplement, ENSURE ENLIVE, (ENSURE ENLIVE) LIQD Take 237 mLs by mouth daily at 2 PM. 237 mL 12   gabapentin  (NEURONTIN ) 100 MG capsule Take 1 capsule (100 mg total) by mouth 2 (two) times daily. 180 capsule 5   glucose blood (GNP TRUE METRIX GLUCOSE STRIPS) test strip Use to check blood sugars twice a day 200 each 2   latanoprost  (XALATAN ) 0.005 % ophthalmic solution Place 1 drop into both eyes at bedtime.      losartan  (COZAAR ) 50 MG tablet Take 1 tablet (50 mg total) by mouth 2 (two) times daily. 60 tablet 5   Netarsudil-Latanoprost  0.02-0.005 % SOLN Apply to eye.     NON FORMULARY Place 10 drops under the tongue daily as needed (Pain). Medication: CBD oil     pantoprazole  (PROTONIX ) 40 MG tablet TAKE 1 TABLET BY MOUTH EVERY DAY 90 tablet 3   rosuvastatin  (CRESTOR ) 5 MG tablet Take 5 mg by mouth daily.     senna (SENOKOT) 8.6 MG TABS tablet Take 1 tablet (8.6 mg total) by mouth 2 (two) times daily. 120 tablet 0   sertraline  (ZOLOFT ) 100 MG tablet Take 1 tablet (100 mg total) by mouth at bedtime. 90 tablet 1   Facility-Administered Medications Prior to Visit  Medication Dose Route Frequency Provider Last Rate Last Admin   regadenoson  (LEXISCAN ) injection SOLN 0.4 mg  0.4 mg Intravenous Once Pemberton, Heather E, MD       technetium tetrofosmin  (  TC-MYOVIEW ) injection 31.1 millicurie  31.1 millicurie Intravenous Once PRN Sonny Dust, MD        ROS: Review of Systems  Constitutional:  Positive for fatigue. Negative for activity change, appetite change, chills and unexpected weight change.  HENT:  Negative for congestion, mouth sores and sinus pressure.   Eyes:  Positive for visual disturbance.  Respiratory:  Negative for cough and chest tightness.   Gastrointestinal:  Negative for abdominal pain and nausea.  Genitourinary:  Negative for difficulty urinating, frequency and vaginal pain.  Musculoskeletal:  Positive for back pain and gait problem.   Skin:  Negative for pallor and rash.  Neurological:  Positive for weakness. Negative for dizziness, tremors, numbness and headaches.  Hematological:  Bruises/bleeds easily.  Psychiatric/Behavioral:  Negative for confusion, sleep disturbance and suicidal ideas. The patient is nervous/anxious.     Objective:  BP (!) 145/70   Pulse (!) 51   Temp 97.8 F (36.6 C)   Ht 5\' 2"  (1.575 m)   SpO2 97%   BMI 19.72 kg/m   BP Readings from Last 3 Encounters:  04/16/24 (!) 145/70  03/18/24 (!) 174/90  03/12/24 (!) 200/90    Wt Readings from Last 3 Encounters:  03/18/24 107 lb 12.8 oz (48.9 kg)  03/12/24 107 lb (48.5 kg)  02/14/24 107 lb (48.5 kg)    Physical Exam Constitutional:      General: She is not in acute distress.    Appearance: Normal appearance. She is well-developed.  HENT:     Head: Normocephalic.     Right Ear: External ear normal.     Left Ear: External ear normal.     Nose: Nose normal.  Eyes:     General:        Right eye: No discharge.        Left eye: No discharge.     Conjunctiva/sclera: Conjunctivae normal.     Pupils: Pupils are equal, round, and reactive to light.  Neck:     Thyroid : No thyromegaly.     Vascular: No JVD.     Trachea: No tracheal deviation.  Cardiovascular:     Rate and Rhythm: Normal rate and regular rhythm.     Heart sounds: Normal heart sounds.  Pulmonary:     Effort: No respiratory distress.     Breath sounds: No stridor. No wheezing.  Abdominal:     General: Bowel sounds are normal. There is no distension.     Palpations: Abdomen is soft. There is no mass.     Tenderness: There is no abdominal tenderness. There is no guarding or rebound.  Musculoskeletal:        General: No tenderness.     Cervical back: Normal range of motion and neck supple. No rigidity.  Lymphadenopathy:     Cervical: No cervical adenopathy.  Skin:    Findings: No erythema or rash.  Neurological:     Cranial Nerves: No cranial nerve deficit.     Motor:  No abnormal muscle tone.     Coordination: Coordination normal.     Deep Tendon Reflexes: Reflexes normal.  Psychiatric:        Behavior: Behavior normal.        Thought Content: Thought content normal.        Judgment: Judgment normal.   In a w/c blind  Lab Results  Component Value Date   WBC 3.8 (L) 03/12/2024   HGB 9.7 (L) 03/12/2024   HCT 29.6 (L) 03/12/2024  PLT 123.0 (L) 03/12/2024   GLUCOSE 70 03/12/2024   CHOL 127 03/01/2023   TRIG 42 03/01/2023   HDL 53 03/01/2023   LDLCALC 66 03/01/2023   ALT 7 03/12/2024   AST 16 03/12/2024   NA 140 03/12/2024   K 4.3 03/12/2024   CL 106 03/12/2024   CREATININE 1.17 03/12/2024   BUN 26 (H) 03/12/2024   CO2 29 03/12/2024   TSH 0.54 09/11/2023   HGBA1C 6.8 (H) 09/11/2023    ECHOCARDIOGRAM COMPLETE Result Date: 01/18/2024    ECHOCARDIOGRAM REPORT   Patient Name:   FANTASHIA SHUPERT Date of Exam: 01/18/2024 Medical Rec #:  [id number]       Height:       62.0 in Accession #:    [id number]      Weight:       113.0 lb Date of Birth:  29-May-1931        BSA:          1.500 m Patient Age:    89 years        BP:           173/89 mmHg Patient Gender: F               HR:           55 bpm. Exam Location:  Inpatient Procedure: 2D Echo, Cardiac Doppler and Color Doppler Indications:    Syncope  History:        Patient has prior history of Echocardiogram examinations, most                 recent 03/01/2023. Arrythmias:Bradycardia; Risk                 Factors:Hypertension and Diabetes.  Sonographer:    Calleen Catena Referring Phys: [id number] TIMOTHY S OPYD IMPRESSIONS  1. Left ventricular ejection fraction, by estimation, is 60 to 65%. The left ventricle has normal function. The left ventricle has no regional wall motion abnormalities. There is moderate left ventricular hypertrophy. Left ventricular diastolic parameters are consistent with Grade II diastolic dysfunction (pseudonormalization). Elevated left atrial pressure. The average left ventricular global  longitudinal strain is -20.3 %.  2. Right ventricular systolic function is normal. The right ventricular size is normal. There is moderately elevated pulmonary artery systolic pressure. The estimated right ventricular systolic pressure is 47.4 mmHg.  3. Left atrial size was severely dilated.  4. The mitral valve is degenerative. Moderate mitral valve regurgitation.  5. Tricuspid valve regurgitation is mild to moderate.  6. The aortic valve is tricuspid. Aortic valve regurgitation is trivial. Aortic valve sclerosis is present, with no evidence of aortic valve stenosis.  7. The inferior vena cava is normal in size with greater than 50% respiratory variability, suggesting right atrial pressure of 3 mmHg. FINDINGS  Left Ventricle: Left ventricular ejection fraction, by estimation, is 60 to 65%. The left ventricle has normal function. The left ventricle has no regional wall motion abnormalities. The average left ventricular global longitudinal strain is -20.3 %. The left ventricular internal cavity size was normal in size. There is moderate left ventricular hypertrophy. Left ventricular diastolic parameters are consistent with Grade II diastolic dysfunction (pseudonormalization). Elevated left atrial pressure. Right Ventricle: The right ventricular size is normal. No increase in right ventricular wall thickness. Right ventricular systolic function is normal. There is moderately elevated pulmonary artery systolic pressure. The tricuspid regurgitant velocity is 3.33 m/s, and with an assumed right atrial pressure of 3 mmHg, the estimated  right ventricular systolic pressure is 47.4 mmHg. Left Atrium: Left atrial size was severely dilated. Right Atrium: Right atrial size was normal in size. Pericardium: There is no evidence of pericardial effusion. Mitral Valve: The mitral valve is degenerative in appearance. Moderate mitral valve regurgitation. Tricuspid Valve: The tricuspid valve is normal in structure. Tricuspid valve  regurgitation is mild to moderate. Aortic Valve: The aortic valve is tricuspid. Aortic valve regurgitation is trivial. Aortic regurgitation PHT measures 654 msec. Aortic valve sclerosis is present, with no evidence of aortic valve stenosis. Pulmonic Valve: The pulmonic valve was not well visualized. Pulmonic valve regurgitation is not visualized. Aorta: The aortic root and ascending aorta are structurally normal, with no evidence of dilitation. Venous: The inferior vena cava is normal in size with greater than 50% respiratory variability, suggesting right atrial pressure of 3 mmHg. IAS/Shunts: The interatrial septum was not well visualized.  LEFT VENTRICLE PLAX 2D LVIDd:         3.80 cm     Diastology LVIDs:         2.40 cm     LV e' medial:    3.92 cm/s LV PW:         1.60 cm     LV E/e' medial:  18.6 LV IVS:        1.60 cm     LV e' lateral:   4.46 cm/s LVOT diam:     2.00 cm     LV E/e' lateral: 16.4 LV SV:         64 LV SV Index:   43          2D Longitudinal Strain LVOT Area:     3.14 cm    2D Strain GLS Avg:     -20.3 %  LV Volumes (MOD) LV vol d, MOD A2C: 54.4 ml LV vol d, MOD A4C: 68.2 ml LV vol s, MOD A2C: 21.8 ml LV vol s, MOD A4C: 23.4 ml LV SV MOD A2C:     32.6 ml LV SV MOD A4C:     68.2 ml LV SV MOD BP:      38.2 ml RIGHT VENTRICLE             IVC RV Basal diam:  3.00 cm     IVC diam: 1.50 cm RV S prime:     14.50 cm/s TAPSE (M-mode): 2.9 cm LEFT ATRIUM              Index        RIGHT ATRIUM           Index LA diam:        4.70 cm  3.13 cm/m   RA Area:     10.60 cm LA Vol (A2C):   98.4 ml  65.61 ml/m  RA Volume:   21.30 ml  14.20 ml/m LA Vol (A4C):   120.0 ml 80.01 ml/m LA Biplane Vol: 116.0 ml 77.34 ml/m  AORTIC VALVE LVOT Vmax:   97.30 cm/s LVOT Vmean:  68.000 cm/s LVOT VTI:    0.203 m AI PHT:      654 msec  AORTA Ao Root diam: 3.10 cm Ao Asc diam:  3.20 cm MITRAL VALVE                  TRICUSPID VALVE MV Area (PHT): 3.65 cm       TR Peak grad:   44.4 mmHg MV Decel Time: 208 msec       TR  Vmax:        333.00 cm/s MR Peak grad:    169.5 mmHg MR Mean grad:    108.0 mmHg   SHUNTS MR Vmax:         651.00 cm/s  Systemic VTI:  0.20 m MR Vmean:        495.0 cm/s   Systemic Diam: 2.00 cm MR PISA:         1.01 cm MR PISA Eff ROA: 6 mm MR PISA Radius:  0.40 cm MV E velocity: 73.10 cm/s MV A velocity: 120.00 cm/s MV E/A ratio:  0.61 Carson Clara MD Electronically signed by Carson Clara MD Signature Date/Time: 01/18/2024/1:10:41 PM    Final    DG Chest Portable 1 View Result Date: 01/17/2024 CLINICAL DATA:  Fever.  Vomiting and diarrhea for 3 days.  Syncope EXAM: PORTABLE CHEST 1 VIEW COMPARISON:  X-ray 02/28/2023. FINDINGS: Hyperinflation. No consolidation, pneumothorax or effusion. No edema. Enlarged cardiopericardial silhouette tortuous aorta, similar to previous. Osteopenia. IMPRESSION: Hyperinflation.  Enlarged heart.  No consolidation. Electronically Signed   By: Adrianna Horde M.D.   On: 01/17/2024 17:50    Assessment & Plan:   Problem List Items Addressed This Visit     DM2 (diabetes mellitus, type 2) (HCC) - Primary   Chronic On Metformin       HTN (hypertension)   On amlodipine  5 mg daily.  Use losartan  25 mg as needed systolic blood pressure over 160      Relevant Orders   Ambulatory referral to Cardiology   Weight loss   Stable wt      Relevant Orders   CBC with Differential/Platelet   Iron , TIBC and Ferritin Panel   Comprehensive metabolic panel with GFR   ABLA (acute blood loss anemia)    GI bleed in Feb lead to syncope Check CBC      Relevant Orders   CBC with Differential/Platelet   Iron , TIBC and Ferritin Panel   Comprehensive metabolic panel with GFR      No orders of the defined types were placed in this encounter.     Follow-up: Return in about 3 months (around 07/16/2024) for a follow-up visit.  Anitra Barn, MD

## 2024-04-16 NOTE — Assessment & Plan Note (Signed)
Chronic On Metformin 

## 2024-04-16 NOTE — Assessment & Plan Note (Signed)
Stable wt

## 2024-04-16 NOTE — Assessment & Plan Note (Signed)
 On amlodipine  5 mg daily.  Use losartan  25 mg as needed systolic blood pressure over 160

## 2024-04-17 LAB — IRON,TIBC AND FERRITIN PANEL
%SAT: 14 % — ABNORMAL LOW (ref 16–45)
Ferritin: 44 ng/mL (ref 16–288)
Iron: 42 ug/dL — ABNORMAL LOW (ref 45–160)
TIBC: 294 ug/dL (ref 250–450)

## 2024-04-21 ENCOUNTER — Encounter: Payer: Self-pay | Admitting: Internal Medicine

## 2024-04-23 ENCOUNTER — Encounter (HOSPITAL_BASED_OUTPATIENT_CLINIC_OR_DEPARTMENT_OTHER): Payer: Self-pay | Admitting: Family

## 2024-04-23 ENCOUNTER — Ambulatory Visit (HOSPITAL_BASED_OUTPATIENT_CLINIC_OR_DEPARTMENT_OTHER): Admitting: Family

## 2024-04-23 VITALS — BP 193/93 | HR 58 | Ht 62.0 in | Wt 107.0 lb

## 2024-04-23 DIAGNOSIS — I10 Essential (primary) hypertension: Secondary | ICD-10-CM | POA: Diagnosis not present

## 2024-04-23 MED ORDER — AMLODIPINE BESYLATE 10 MG PO TABS: 10.0000 mg | ORAL_TABLET | Freq: Every day | ORAL | 3 refills | Status: AC

## 2024-04-23 NOTE — Progress Notes (Signed)
 [facility] Initial Assessment:    Date:  04/23/2024   ID:  Breanna [name], DOB 06-14-1931, MRN [id number]  PCP:  Genia Kettering, MD  Cardiologist:  None  Nephrologist:  Referring MD: Rondall Codding, FNP   CC: Hypertension  History of Present Illness:    Breanna [name] is a 89 y.o. female with a hx of hypertension, constipation, DM2, gout, right adrenal adenoma, mitral and tricuspid regurgitation, diastolic dysfunction, CKDIII, coronary calcifications on CT, cerebellar CVA 2010 here to establish care in the [facility].   Previously evaluated by cardiology for atypical chest pain 02/2023. Myoview  03/2023 LVEF 55-65%, small region of apical ischemia recommended for medical management as asymtpomatic.   Prior GI bleed 01/2023 led to syncope. Her antihypertensive regimen was reduced at that time (Losartan  100mg  ? 25mg  ). Echo 01/18/24 normal LVEF 60-65%, gr2dd, RV normal, moderately elevated PASP, LA severely dilated, moderate MR, mild to moderate TR, trivial AI. Referred by PCP after labs with home health BP 180s/80s heart rate 49-54 bpm. At visit 04/16/24 with PCP BP 145/70 and HR 51 on Amlodipine  5mg  daily and Losartan  25mg  PRN SBP >160.   Breanna [name] was diagnosed with hypertension many years ago. It has been difficult to control. Blood pressure checked with arm cuff at home. Readings have been 160. she reports tobacco use never. Alcohol  use never. For exercise she uses her elliptical at home. she eats at home though family often brings her meals (three of her children alternate caregiving) and does not follow low sodium diet often preferring food from [facility] or [employer]. Drinks sweet tea or water but family notes she does not drink much. Edema in her right foot intermittently which is not bothersome and independently resolves. Taking medications ~10a and 6-7pm.    Previous antihypertensives: Verapamil - SOB Atenolol - fatigue Hydrochlorothiazide   gout Lisinopril - cough Valsartan - itching Spironolactone  - stopped 01/2023 in setting of hypotension    Past Medical History:  Diagnosis Date   Adrenal adenoma 12/24/2004   Boils 12/25/2007   Cholelithiasis    asympt. w/normal HIDA 03/2010   CVA (cerebral infarction) 12/24/2008   Cerebellar   Diverticulitis    Esophageal spasm 12/24/2009   GERD (gastroesophageal reflux disease)    Gout    History of colon polyps    HTN (hypertension)    Hydronephrosis    LEFT/ Surgical intervention   Hyperlipidemia    LBP (low back pain)    Osteoarthritis    Pulmonary HTN (HCC)    Stress    Type II or unspecified type diabetes mellitus without mention of complication, not stated as uncontrolled     Past Surgical History:  Procedure Laterality Date   ABDOMINAL HYSTERECTOMY     APPENDECTOMY     2020   BACK SURGERY     BREAST BIOPSY     RIGHT   CATARACT EXTRACTION, BILATERAL     COLECTOMY  2006   Sigmoid   COLONOSCOPY Left 01/20/2024   Procedure: COLONOSCOPY;  Surgeon: Tobin Forts, MD;  Location: [facility];  Service: Gastroenterology;  Laterality: Left;   ESOPHAGOGASTRODUODENOSCOPY Left 01/19/2024   Procedure: ESOPHAGOGASTRODUODENOSCOPY (EGD);  Surgeon: Annis Kinder, DO;  Location: [facility];  Service: Gastroenterology;  Laterality: Left;   FOOT SURGERY     BILATERAL   HAMMER TOE SURGERY     INTRAMEDULLARY (IM) NAIL INTERTROCHANTERIC Right 07/21/2020   Procedure: INTRAMEDULLARY (IM) NAIL INTERTROCHANTRIC;  Surgeon: Saundra Curl, MD;  Location: [facility];  Service: Orthopedics;  Laterality: Right;   ROTATOR CUFF REPAIR  2008   RIGHT   TOTAL KNEE ARTHROPLASTY  2003   LEFT   VARICOSE VEIN SURGERY     vein stripping/lower extremities    Current Medications: Current Meds  Medication Sig   acetaminophen  (TYLENOL ) 325 MG tablet Take 2 tablets (650 mg total) by mouth every 6 (six) hours as needed for moderate pain or fever.   allopurinol  (ZYLOPRIM ) 100 MG tablet Take 0.5  tablets (50 mg total) by mouth daily.   atropine  1 % ophthalmic solution Place 1 drop into both eyes daily.   brimonidine  (ALPHAGAN ) 0.2 % ophthalmic solution Place 1 drop into the right eye 2 (two) times daily.   [employer] VITAMIN B12 1000 MCG tablet TAKE 1 TABLET (1,000 MCG TOTAL) BY MOUTH EVERY DAY   docusate sodium  (COLACE) 100 MG capsule TAKE 1 CAPSULE BY MOUTH TWICE A DAY   dorzolamide -timolol  (COSOPT ) 22.3-6.8 MG/ML ophthalmic solution Place 1 drop into both eyes 2 (two) times daily.    feeding supplement, ENSURE ENLIVE, (ENSURE ENLIVE) LIQD Take 237 mLs by mouth daily at 2 PM.   gabapentin  (NEURONTIN ) 100 MG capsule Take 1 capsule (100 mg total) by mouth 2 (two) times daily.   glucose blood (GNP TRUE METRIX GLUCOSE STRIPS) test strip Use to check blood sugars twice a day   latanoprost  (XALATAN ) 0.005 % ophthalmic solution Place 1 drop into both eyes at bedtime.    losartan  (COZAAR ) 50 MG tablet Take 1 tablet (50 mg total) by mouth 2 (two) times daily.   Netarsudil-Latanoprost  0.02-0.005 % SOLN Apply to eye.   pantoprazole  (PROTONIX ) 40 MG tablet TAKE 1 TABLET BY MOUTH EVERY DAY   rosuvastatin  (CRESTOR ) 5 MG tablet Take 5 mg by mouth daily.   senna (SENOKOT) 8.6 MG TABS tablet Take 1 tablet (8.6 mg total) by mouth 2 (two) times daily.   sertraline  (ZOLOFT ) 100 MG tablet Take 1 tablet (100 mg total) by mouth at bedtime.   [DISCONTINUED] amLODipine  (NORVASC ) 5 MG tablet Take 1 tablet (5 mg total) by mouth daily.   [DISCONTINUED] NON FORMULARY Place 10 drops under the tongue daily as needed (Pain). Medication: CBD oil     Allergies:   Verapamil, Aspirin , Atenolol, Codeine, Codeine sulfate, Hydrochlorothiazide, Hydrocodone , Ibuprofen , Lisinopril, Onion, Shellfish allergy, Statins, Valsartan, Adhesive  [tape], and Other   Social History   Socioeconomic History   Marital status: Widowed    Spouse name: Corinda Racine   Number of children: 1   Years of education: Not on file   Highest education  level: 12th grade  Occupational History   Occupation: Retired    Associate Professor: RETIRED  Tobacco Use   Smoking status: Never   Smokeless tobacco: Never  Vaping Use   Vaping status: Never Used  Substance and Sexual Activity   Alcohol  use: No    Alcohol /week: 0.0 standard drinks of alcohol    Drug use: No   Sexual activity: Not Currently  Other Topics Concern   Not on file  Social History Narrative   Lives with son.   Social Drivers of Corporate investment banker Strain: Low Risk  (02/12/2024)   Overall Financial Resource Strain (CARDIA)    Difficulty of Paying Living Expenses: Not hard at all  Food Insecurity: No Food Insecurity (04/23/2024)   Hunger Vital Sign    Worried About Running Out of Food in the Last Year: Never true    Ran Out of Food in  the Last Year: Never true  Transportation Needs: No Transportation Needs (04/23/2024)   PRAPARE - Administrator, Civil Service (Medical): No    Lack of Transportation (Non-Medical): No  Recent Concern: Transportation Needs - Unmet Transportation Needs (02/12/2024)   PRAPARE - Transportation    Lack of Transportation (Medical): Yes    Lack of Transportation (Non-Medical): Yes  Physical Activity: Inactive (02/12/2024)   Exercise Vital Sign    Days of Exercise per Week: 0 days    Minutes of Exercise per Session: 0 min  Stress: No Stress Concern Present (04/23/2024)   [employer] of Occupational Health - Occupational Stress Questionnaire    Feeling of Stress : Not at all  Social Connections: Moderately Integrated (04/23/2024)   Social Connection and Isolation Panel [NHANES]    Frequency of Communication with Friends and Family: More than three times a week    Frequency of Social Gatherings with Friends and Family: More than three times a week    Attends Religious Services: More than 4 times per year    Active Member of [employer] or Organizations: Yes    Attends Banker Meetings: Patient declined    Marital Status:  Widowed     Family History: The patient's family history includes Breast cancer in her daughter; Cancer in her daughter, father, and maternal uncle; Diabetes in her mother and another family member; Heart disease in her mother and another family member; Hypertension in her father and another family member; Prostate cancer in her father and maternal uncle. There is no history of Colon cancer.  ROS:   Please see the history of present illness.     All other systems reviewed and are negative.  EKGs/Labs/Other Studies Reviewed:         Recent Labs: 09/11/2023: TSH 0.54 01/19/2024: Magnesium  1.9 04/16/2024: ALT 6; BUN 24; Creatinine, Ser 1.29; Hemoglobin 9.6; Platelets 130.0; Potassium 4.5; Sodium 141   Recent Lipid Panel    Component Value Date/Time   CHOL 127 03/01/2023 0641   TRIG 42 03/01/2023 0641   TRIG 55 11/21/2006 0851   HDL 53 03/01/2023 0641   CHOLHDL 2.4 03/01/2023 0641   VLDL 8 03/01/2023 0641   LDLCALC 66 03/01/2023 0641    Physical Exam:   VS:  BP (!) 193/93   Pulse (!) 58   Ht 5\' 2"  (1.575 m)   Wt 107 lb (48.5 kg)   SpO2 100%   BMI 19.57 kg/m  , BMI Body mass index is 19.57 kg/m. GENERAL:  Well appearing HEENT: Pupils equal round and reactive, fundi not visualized, oral mucosa unremarkable NECK:  No jugular venous distention, waveform within normal limits, carotid upstroke brisk and symmetric, no bruits, no thyromegaly LYMPHATICS:  No cervical adenopathy LUNGS:  Clear to auscultation bilaterally HEART:  RRR.  PMI not displaced or sustained,S1 and S2 within normal limits, no S3, no S4, no clicks, no rubs, no murmurs ABD:  Flat, positive bowel sounds normal in frequency in pitch, no bruits, no rebound, no guarding, no midline pulsatile mass, no hepatomegaly, no splenomegaly EXT:  2 plus pulses throughout, no edema, no cyanosis no clubbing SKIN:  No rashes no nodules NEURO:  Cranial nerves II through XII grossly intact, motor grossly intact throughout PSYCH:   Cognitively intact, oriented to person place and time   ASSESSMENT/PLAN:    HTN - BP goal <140/90 given age, body habitus. SBP at home average ~160 with arm cuff. Increase Amlodipine  to 10mg  daily Continue Losartan  50mg   BID. If BP not controlled at upcoming visit with PCP, consider transition to Olmesartan 40mg  daily in the evening Remain off Spironolactone  ot avoid multiple medication changes at home time. Previously discontinued due to hypotension in setting of anemia though no prior intolerance. Discussed possible transition to adherence packing through [facility], reconsider when medication regimen stabilized Discussed tips to reduce sodium intake as meals often brought in from restaurants by family members. Secondary workup 08/2023 normal TSH Low suspicion hyperaldosteronism as no persistent hypokalemia Defer renal artery duplex today as low suspicion she would want intervention due to age if significant renal stenosis noted  HLD - continue Crestor  5mg  daily. Managed per PCP team  Screening for Secondary Hypertension:     04/23/2024   10:12 AM  Causes  Drugs/Herbals Screened     - Comments no excessive caffeine  Thyroid  Disease Screened     - Comments 08/2023 normal TSH  Pheochromocytoma N/A     - Comments adrenal adenoma with no pheochromocytoma  by prior CT abd  Cushing's Syndrome N/A     - Comments non cushingoid appearance  Compliance Screened     - Comments family assists with medications    Relevant Labs/Studies:    Latest Ref Rng & Units 04/16/2024    2:38 PM 03/12/2024    2:41 PM 01/30/2024    2:28 PM  Basic Labs  Sodium 135 - 145 mEq/L 141  140  140   Potassium 3.5 - 5.1 mEq/L 4.5  4.3  3.7   Creatinine 0.40 - 1.20 mg/dL 6.96  2.95  2.84        Latest Ref Rng & Units 09/11/2023    9:15 AM 07/11/2023    9:53 AM  Thyroid    TSH 0.35 - 5.50 uIU/mL 0.54  0.52                   Disposition:    FU with MD/APP/PharmD in 1-2 months    Medication  Adjustments/Labs and Tests Ordered: Current medicines are reviewed at length with the patient today.  Concerns regarding medicines are outlined above.  No orders of the defined types were placed in this encounter.  Meds ordered this encounter  Medications   amLODipine  (NORVASC ) 10 MG tablet    Sig: Take 1 tablet (10 mg total) by mouth daily.    Dispense:  90 tablet    Refill:  3     Signed, Clearnce Curia, NP  04/23/2024 10:13 AM    [facility]

## 2024-04-23 NOTE — Patient Instructions (Signed)
 Medication Instructions:  Your physician has recommended you make the following change in your medication:   Change: Amlodipine  10mg  daily in the morning   If BP is still elevated in the future we may consider switching Losartan  to Olmesartan   Follow-Up: Please follow up in 1-2 months in [facility] with Dr. Theodis Fiscal, Neomi Banks, NP or Donivan Furry PharmD

## 2024-04-24 ENCOUNTER — Other Ambulatory Visit: Payer: Self-pay | Admitting: Internal Medicine

## 2024-04-27 ENCOUNTER — Other Ambulatory Visit: Payer: Self-pay | Admitting: Internal Medicine

## 2024-04-27 DIAGNOSIS — H401133 Primary open-angle glaucoma, bilateral, severe stage: Secondary | ICD-10-CM | POA: Diagnosis not present

## 2024-04-27 DIAGNOSIS — I10 Essential (primary) hypertension: Secondary | ICD-10-CM

## 2024-04-30 ENCOUNTER — Telehealth: Payer: Self-pay

## 2024-04-30 NOTE — Telephone Encounter (Signed)
 I was able to re-fax this paperwork with a fax confirmation.

## 2024-04-30 NOTE — Telephone Encounter (Signed)
 Copied from CRM [id number]. Topic: General - Other >> Apr 29, 2024  4:20 PM Aisha D wrote: Reason for CRM: Breanna [name] with [facility] is calling in regards to an order for Home Health certification and plan of care. Breanna [name] stated it was faxed on 4/16 and needs to be signed and faxed back. Order number is [id number].

## 2024-05-05 ENCOUNTER — Ambulatory Visit (INDEPENDENT_AMBULATORY_CARE_PROVIDER_SITE_OTHER): Admitting: Licensed Clinical Social Worker

## 2024-05-05 DIAGNOSIS — H5316 Psychophysical visual disturbances: Secondary | ICD-10-CM | POA: Diagnosis not present

## 2024-05-05 NOTE — Progress Notes (Signed)
 [facility] Counselor/Therapist Progress Note  Patient ID: Breanna [name], MRN: [id number]    Date: 05/05/24  Time Spent: 10:02  am - 10:57 am : 55 Minutes  Treatment Type: Individual Therapy.  Reported Symptoms: feeling blessed and in good spirits, enjoyed spending some time with family members for mothers day  Mental Status Exam: Appearance:  Well Groomed     Behavior: Appropriate  Motor: Normal  Speech/Language:  Normal Rate  Affect: Appropriate  Mood: normal  Thought process: normal  Thought content:   WNL  Sensory/Perceptual disturbances:   WNL  Orientation: oriented to person, place, and time/date  Attention: Good  Concentration: Good  Memory: WNL  Fund of knowledge:  Good  Insight:   Good  Judgment:  Good  Impulse Control: Good   Risk Assessment: Danger to Self:  No Self-injurious Behavior: No Danger to Others: No Duty to Warn:no Physical Aggression / Violence:No  Access to Firearms a concern: No  Gang Involvement:No   Subjective:   [employer] participated in the session, in person in the office with the therapist, and consented to treatment. Breanna [name] reviewed the events of the past week.      Interventions: Mindfulness Meditation and Narrative  Diagnosis:   Breanna [name] syndrome   Psychiatric Treatment: No , N/A  Treatment Plan:  Client Abilities/Strengths Breanna [name] is open and receptive to sessions.  Support System: Family and Friends  Client Treatment Preferences In person   Client Statement of Needs Breanna [name] would like to utilize sessions to discuss things of concern as she adjusts to her new diagnosis.     Treatment Level Monthly  Symptoms  Grateful   (Status: improved) Optimistic   (Status: improved)  Goals:   Breanna [name] has been attending to self and spending time with family members, attending church, adjusting to her new Breanna [name] and the order of services   Target Date: 06/16/24 Frequency: Monthly  Progress: 0 Modality:  individual    Therapist will provide referrals for additional resources as appropriate.  Therapist will provide psycho-education regarding Breanna [name]'s diagnosis and corresponding treatment approaches and interventions. Licensed Clinical Mental Health Counselor, Breanna [name], [facility] will support the patient's ability to achieve the goals identified. will employ CBT, BA, Problem-solving, Solution Focused, Mindfulness,  coping skills, & other evidenced-based practices will be used to promote progress towards healthy functioning to help manage decrease symptoms associated with her diagnosis.   Reduce overall level, frequency, and intensity of the feelings of depression, anxiety and panic evidenced by increased acceptance of new levels of capabilities as she is aging.   Verbally express understanding of the relationship between feelings of depression, anxiety and their impact on thinking patterns and behaviors. Verbalize an understanding of the role that distorted thinking plays in creating fears, excessive worry, and ruminations.  Erna He participated in the creation of the treatment plan)      Breanna [name], LCMHC

## 2024-05-06 ENCOUNTER — Ambulatory Visit: Payer: Medicare PPO | Admitting: Internal Medicine

## 2024-05-13 DIAGNOSIS — H401133 Primary open-angle glaucoma, bilateral, severe stage: Secondary | ICD-10-CM | POA: Diagnosis not present

## 2024-05-13 DIAGNOSIS — Z961 Presence of intraocular lens: Secondary | ICD-10-CM | POA: Diagnosis not present

## 2024-05-13 DIAGNOSIS — E119 Type 2 diabetes mellitus without complications: Secondary | ICD-10-CM | POA: Diagnosis not present

## 2024-05-13 DIAGNOSIS — Z947 Corneal transplant status: Secondary | ICD-10-CM | POA: Diagnosis not present

## 2024-05-22 ENCOUNTER — Ambulatory Visit: Admitting: Internal Medicine

## 2024-05-22 ENCOUNTER — Telehealth: Payer: Self-pay

## 2024-05-22 ENCOUNTER — Encounter: Payer: Self-pay | Admitting: Internal Medicine

## 2024-05-22 VITALS — BP 130/62 | HR 90 | Temp 98.8°F | Ht 62.0 in | Wt 108.0 lb

## 2024-05-22 DIAGNOSIS — D649 Anemia, unspecified: Secondary | ICD-10-CM | POA: Diagnosis not present

## 2024-05-22 DIAGNOSIS — E1121 Type 2 diabetes mellitus with diabetic nephropathy: Secondary | ICD-10-CM | POA: Diagnosis not present

## 2024-05-22 DIAGNOSIS — M1000 Idiopathic gout, unspecified site: Secondary | ICD-10-CM

## 2024-05-22 DIAGNOSIS — I10 Essential (primary) hypertension: Secondary | ICD-10-CM | POA: Diagnosis not present

## 2024-05-22 DIAGNOSIS — Z8673 Personal history of transient ischemic attack (TIA), and cerebral infarction without residual deficits: Secondary | ICD-10-CM

## 2024-05-22 MED ORDER — ALLOPURINOL 100 MG PO TABS: 50.0000 mg | ORAL_TABLET | Freq: Every day | ORAL | 1 refills | Status: DC

## 2024-05-22 NOTE — Patient Instructions (Signed)
 Take Losartan  50 mg with breakfast. Take Amlodipine  10 mg with supper. Take Spironolactone  3 days/week as before

## 2024-05-22 NOTE — Assessment & Plan Note (Signed)
 SBP 170 in am Take Losartan  50 mg with breakfast. Take Amlodipine  10 mg with supper. Take Spironolactone  3/wk

## 2024-05-22 NOTE — Assessment & Plan Note (Addendum)
 Chronic, multifactorial. Rescent  Iron  rich foods MVI w/iron  S/p RBC transfusion

## 2024-05-22 NOTE — Assessment & Plan Note (Signed)
Will reduce Allopurinol to 50 mg/d due to CRI

## 2024-05-22 NOTE — Assessment & Plan Note (Signed)
Chronic On Metformin 

## 2024-05-22 NOTE — Assessment & Plan Note (Signed)
Start taking Amlodipine 10 mg a day if BP is high, ie BP>170/100 ?

## 2024-05-22 NOTE — Telephone Encounter (Signed)
 Called to cancel DSM appt.. we are no longer taking new shoe orders. No VM box set up

## 2024-05-22 NOTE — Progress Notes (Signed)
 Subjective:  Patient ID: Breanna [name], female    DOB: Aug 21, 1931  Age: 89 y.o. MRN: [id number]  CC: Medical Management of Chronic Issues (3 mnth f/u, elevated BP in the mornings before pt takes medications, wants to discuss if adding another BP med would help with spikes... Pt has concerns of rt eye pain a/w being told she has blisters on her rt eye)   HPI [facility] presents for c/o SBP 170 in am F/u on elevated BP in the mornings before pt takes medications, wants to discuss if adding another BP med would help with spikes.  F/u on gout Pt has concerns of rt eye pain a/w being told she has blisters on her rt eye)  Outpatient Medications Prior to Visit  Medication Sig Dispense Refill   acetaminophen  (TYLENOL ) 325 MG tablet Take 2 tablets (650 mg total) by mouth every 6 (six) hours as needed for moderate pain or fever.     amLODipine  (NORVASC ) 10 MG tablet Take 1 tablet (10 mg total) by mouth daily. 90 tablet 3   atropine  1 % ophthalmic solution Place 1 drop into both eyes daily.     brimonidine  (ALPHAGAN ) 0.2 % ophthalmic solution Place 1 drop into the right eye 2 (two) times daily.     [facility] VITAMIN B12 1000 MCG tablet TAKE 1 TABLET (1,000 MCG TOTAL) BY MOUTH EVERY DAY 90 tablet 3   docusate sodium  (COLACE) 100 MG capsule TAKE 1 CAPSULE BY MOUTH TWICE A DAY 180 capsule 3   dorzolamide -timolol  (COSOPT ) 22.3-6.8 MG/ML ophthalmic solution Place 1 drop into both eyes 2 (two) times daily.      feeding supplement, ENSURE ENLIVE, (ENSURE ENLIVE) LIQD Take 237 mLs by mouth daily at 2 PM. 237 mL 12   gabapentin  (NEURONTIN ) 100 MG capsule Take 1 capsule (100 mg total) by mouth 2 (two) times daily. 180 capsule 5   glucose blood (GNP TRUE METRIX GLUCOSE STRIPS) test strip Use to check blood sugars twice a day 200 each 2   latanoprost  (XALATAN ) 0.005 % ophthalmic solution Place 1 drop into both eyes at bedtime.      losartan  (COZAAR ) 50 MG tablet TAKE 1 TABLET BY MOUTH EVERY DAY 90 tablet 3    Netarsudil-Latanoprost  0.02-0.005 % SOLN Apply to eye.     pantoprazole  (PROTONIX ) 40 MG tablet TAKE 1 TABLET BY MOUTH EVERY DAY 90 tablet 3   rosuvastatin  (CRESTOR ) 5 MG tablet Take 5 mg by mouth daily.     senna (SENOKOT) 8.6 MG TABS tablet Take 1 tablet (8.6 mg total) by mouth 2 (two) times daily. 120 tablet 0   sertraline  (ZOLOFT ) 100 MG tablet Take 1 tablet (100 mg total) by mouth at bedtime. 90 tablet 1   allopurinol  (ZYLOPRIM ) 100 MG tablet TAKE 1 TABLET BY MOUTH EVERY DAY 90 tablet 1   No facility-administered medications prior to visit.    ROS: Review of Systems  Constitutional:  Negative for activity change, appetite change, chills, fatigue and unexpected weight change.  HENT:  Negative for congestion, mouth sores and sinus pressure.   Eyes:  Positive for pain, redness and visual disturbance.  Respiratory:  Negative for cough and chest tightness.   Gastrointestinal:  Negative for abdominal pain and nausea.  Genitourinary:  Negative for difficulty urinating, frequency and vaginal pain.  Musculoskeletal:  Positive for back pain and gait problem.  Skin:  Negative for pallor and rash.  Neurological:  Negative for dizziness, tremors, weakness, numbness and headaches.  Psychiatric/Behavioral:  Negative for confusion and sleep disturbance.     Objective:  BP 130/62   Pulse 90   Temp 98.8 F (37.1 C) (Oral)   Ht 5\' 2"  (1.575 m)   Wt 108 lb (49 kg)   SpO2 97%   BMI 19.75 kg/m   BP Readings from Last 3 Encounters:  05/22/24 130/62  04/23/24 (!) 193/93  04/16/24 (!) 145/70    Wt Readings from Last 3 Encounters:  05/22/24 108 lb (49 kg)  04/23/24 107 lb (48.5 kg)  03/18/24 107 lb 12.8 oz (48.9 kg)    Physical Exam Constitutional:      General: She is not in acute distress.    Appearance: Normal appearance. She is well-developed. She is not ill-appearing.  HENT:     Head: Normocephalic.     Right Ear: External ear normal.     Left Ear: External ear normal.      Nose: Nose normal.     Mouth/Throat:     Pharynx: No oropharyngeal exudate or posterior oropharyngeal erythema.  Eyes:     General:        Right eye: No discharge.        Left eye: No discharge.     Conjunctiva/sclera: Conjunctivae normal.     Pupils: Pupils are equal, round, and reactive to light.  Neck:     Thyroid : No thyromegaly.     Vascular: No JVD.     Trachea: No tracheal deviation.  Cardiovascular:     Rate and Rhythm: Normal rate and regular rhythm.     Heart sounds: Normal heart sounds.  Pulmonary:     Effort: No respiratory distress.     Breath sounds: No stridor. No wheezing.  Abdominal:     General: Bowel sounds are normal. There is no distension.     Palpations: Abdomen is soft. There is no mass.     Tenderness: There is no abdominal tenderness. There is no guarding or rebound.  Musculoskeletal:        General: No tenderness.     Cervical back: Normal range of motion and neck supple. No rigidity.  Lymphadenopathy:     Cervical: No cervical adenopathy.  Skin:    Findings: No erythema or rash.  Neurological:     Cranial Nerves: No cranial nerve deficit.     Motor: No abnormal muscle tone.     Coordination: Coordination normal.     Deep Tendon Reflexes: Reflexes normal.  Psychiatric:        Behavior: Behavior normal.        Thought Content: Thought content normal.        Judgment: Judgment normal.   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  Lab Results  Component Value Date   WBC 4.4 04/16/2024   HGB 9.6 (L) 04/16/2024   HCT 30.0 (L) 04/16/2024   PLT 130.0 (L) 04/16/2024   GLUCOSE 93 04/16/2024   CHOL 127 03/01/2023   TRIG 42 03/01/2023   HDL 53 03/01/2023   LDLCALC 66 03/01/2023   ALT 6 04/16/2024   AST 15 04/16/2024   NA 141 04/16/2024   K 4.5 04/16/2024   CL 109 04/16/2024   CREATININE 1.29 (H) 04/16/2024   BUN 24 (H) 04/16/2024   CO2 28 04/16/2024   TSH 0.54 09/11/2023   HGBA1C 6.8 (H) 09/11/2023    ECHOCARDIOGRAM COMPLETE Result Date: 01/18/2024     ECHOCARDIOGRAM REPORT   Patient Name:   ISATU MACINNES Northrup Date of Exam: 01/18/2024  Medical Rec #:  [id number]       Height:       62.0 in Accession #:    [id number]      Weight:       113.0 lb Date of Birth:  [birth year]        BSA:          1.500 m Patient Age:    89 years        BP:           173/89 mmHg Patient Gender: F               HR:           55 bpm. Exam Location:  Inpatient Procedure: 2D Echo, Cardiac Doppler and Color Doppler Indications:    Syncope  History:        Patient has prior history of Echocardiogram examinations, most                 recent 03/01/2023. Arrythmias:Bradycardia; Risk                 Factors:Hypertension and Diabetes.  Sonographer:    Calleen Catena Referring Phys: [id number] TIMOTHY S OPYD IMPRESSIONS  1. Left ventricular ejection fraction, by estimation, is 60 to 65%. The left ventricle has normal function. The left ventricle has no regional wall motion abnormalities. There is moderate left ventricular hypertrophy. Left ventricular diastolic parameters are consistent with Grade II diastolic dysfunction (pseudonormalization). Elevated left atrial pressure. The average left ventricular global longitudinal strain is -20.3 %.  2. Right ventricular systolic function is normal. The right ventricular size is normal. There is moderately elevated pulmonary artery systolic pressure. The estimated right ventricular systolic pressure is 47.4 mmHg.  3. Left atrial size was severely dilated.  4. The mitral valve is degenerative. Moderate mitral valve regurgitation.  5. Tricuspid valve regurgitation is mild to moderate.  6. The aortic valve is tricuspid. Aortic valve regurgitation is trivial. Aortic valve sclerosis is present, with no evidence of aortic valve stenosis.  7. The inferior vena cava is normal in size with greater than 50% respiratory variability, suggesting right atrial pressure of 3 mmHg. FINDINGS  Left Ventricle: Left ventricular ejection fraction, by estimation, is 60 to 65%. The left  ventricle has normal function. The left ventricle has no regional wall motion abnormalities. The average left ventricular global longitudinal strain is -20.3 %. The left ventricular internal cavity size was normal in size. There is moderate left ventricular hypertrophy. Left ventricular diastolic parameters are consistent with Grade II diastolic dysfunction (pseudonormalization). Elevated left atrial pressure. Right Ventricle: The right ventricular size is normal. No increase in right ventricular wall thickness. Right ventricular systolic function is normal. There is moderately elevated pulmonary artery systolic pressure. The tricuspid regurgitant velocity is 3.33 m/s, and with an assumed right atrial pressure of 3 mmHg, the estimated right ventricular systolic pressure is 47.4 mmHg. Left Atrium: Left atrial size was severely dilated. Right Atrium: Right atrial size was normal in size. Pericardium: There is no evidence of pericardial effusion. Mitral Valve: The mitral valve is degenerative in appearance. Moderate mitral valve regurgitation. Tricuspid Valve: The tricuspid valve is normal in structure. Tricuspid valve regurgitation is mild to moderate. Aortic Valve: The aortic valve is tricuspid. Aortic valve regurgitation is trivial. Aortic regurgitation PHT measures 654 msec. Aortic valve sclerosis is present, with no evidence of aortic valve stenosis. Pulmonic Valve: The pulmonic valve was not well visualized. Pulmonic valve regurgitation is not visualized. Aorta:  The aortic root and ascending aorta are structurally normal, with no evidence of dilitation. Venous: The inferior vena cava is normal in size with greater than 50% respiratory variability, suggesting right atrial pressure of 3 mmHg. IAS/Shunts: The interatrial septum was not well visualized.  LEFT VENTRICLE PLAX 2D LVIDd:         3.80 cm     Diastology LVIDs:         2.40 cm     LV e' medial:    3.92 cm/s LV PW:         1.60 cm     LV E/e' medial:  18.6  LV IVS:        1.60 cm     LV e' lateral:   4.46 cm/s LVOT diam:     2.00 cm     LV E/e' lateral: 16.4 LV SV:         64 LV SV Index:   43          2D Longitudinal Strain LVOT Area:     3.14 cm    2D Strain GLS Avg:     -20.3 %  LV Volumes (MOD) LV vol d, MOD A2C: 54.4 ml LV vol d, MOD A4C: 68.2 ml LV vol s, MOD A2C: 21.8 ml LV vol s, MOD A4C: 23.4 ml LV SV MOD A2C:     32.6 ml LV SV MOD A4C:     68.2 ml LV SV MOD BP:      38.2 ml RIGHT VENTRICLE             IVC RV Basal diam:  3.00 cm     IVC diam: 1.50 cm RV S prime:     14.50 cm/s TAPSE (M-mode): 2.9 cm LEFT ATRIUM              Index        RIGHT ATRIUM           Index LA diam:        4.70 cm  3.13 cm/m   RA Area:     10.60 cm LA Vol (A2C):   98.4 ml  65.61 ml/m  RA Volume:   21.30 ml  14.20 ml/m LA Vol (A4C):   120.0 ml 80.01 ml/m LA Biplane Vol: 116.0 ml 77.34 ml/m  AORTIC VALVE LVOT Vmax:   97.30 cm/s LVOT Vmean:  68.000 cm/s LVOT VTI:    0.203 m AI PHT:      654 msec  AORTA Ao Root diam: 3.10 cm Ao Asc diam:  3.20 cm MITRAL VALVE                  TRICUSPID VALVE MV Area (PHT): 3.65 cm       TR Peak grad:   44.4 mmHg MV Decel Time: 208 msec       TR Vmax:        333.00 cm/s MR Peak grad:    169.5 mmHg MR Mean grad:    108.0 mmHg   SHUNTS MR Vmax:         651.00 cm/s  Systemic VTI:  0.20 m MR Vmean:        495.0 cm/s   Systemic Diam: 2.00 cm MR PISA:         1.01 cm MR PISA Eff ROA: 6 mm MR PISA Radius:  0.40 cm MV E velocity: 73.10 cm/s MV A velocity: 120.00 cm/s MV E/A ratio:  0.61 Carson Clara MD Electronically  signed by Carson Clara MD Signature Date/Time: 01/18/2024/1:10:41 PM    Final    DG Chest Portable 1 View Result Date: 01/17/2024 CLINICAL DATA:  Fever.  Vomiting and diarrhea for 3 days.  Syncope EXAM: PORTABLE CHEST 1 VIEW COMPARISON:  X-ray 02/28/2023. FINDINGS: Hyperinflation. No consolidation, pneumothorax or effusion. No edema. Enlarged cardiopericardial silhouette tortuous aorta, similar to previous. Osteopenia.  IMPRESSION: Hyperinflation.  Enlarged heart.  No consolidation. Electronically Signed   By: Adrianna Horde M.D.   On: 01/17/2024 17:50    Assessment & Plan:   Problem List Items Addressed This Visit     DM2 (diabetes mellitus, type 2) (HCC) - Primary   Chronic On Metformin       Gout   Will reduce Allopurinol  to 50 mg/d due to CRI      HTN (hypertension)   SBP 170 in am Take Losartan  50 mg with breakfast. Take Amlodipine  10 mg with supper. Take Spironolactone  3/wk      History of cardioembolic cerebrovascular accident (CVA)   Start taking Amlodipine  10 mg a day if BP is high, ie BP>170/100      Anemia   Chronic, multifactorial. Rescent  Iron  rich foods MVI w/iron  S/p RBC transfusion         Meds ordered this encounter  Medications   allopurinol  (ZYLOPRIM ) 100 MG tablet    Sig: Take 0.5 tablets (50 mg total) by mouth daily.    Dispense:  45 tablet    Refill:  1      Follow-up: Return in about 2 months (around 07/22/2024) for a follow-up visit.  Anitra Barn, MD

## 2024-06-02 ENCOUNTER — Emergency Department (HOSPITAL_COMMUNITY)

## 2024-06-02 ENCOUNTER — Ambulatory Visit: Admitting: Internal Medicine

## 2024-06-02 ENCOUNTER — Other Ambulatory Visit: Payer: Self-pay

## 2024-06-02 ENCOUNTER — Telehealth: Payer: Self-pay | Admitting: Internal Medicine

## 2024-06-02 ENCOUNTER — Ambulatory Visit: Payer: Self-pay

## 2024-06-02 ENCOUNTER — Inpatient Hospital Stay (HOSPITAL_COMMUNITY)
Admission: EM | Admit: 2024-06-02 | Discharge: 2024-06-06 | DRG: 956 | Disposition: A | Discharge status: Skilled Nursing Facility | Attending: Student | Admitting: Student

## 2024-06-02 ENCOUNTER — Encounter (HOSPITAL_COMMUNITY): Payer: Self-pay

## 2024-06-02 DIAGNOSIS — M4802 Spinal stenosis, cervical region: Secondary | ICD-10-CM | POA: Diagnosis not present

## 2024-06-02 DIAGNOSIS — M19012 Primary osteoarthritis, left shoulder: Secondary | ICD-10-CM | POA: Diagnosis not present

## 2024-06-02 DIAGNOSIS — E7849 Other hyperlipidemia: Secondary | ICD-10-CM

## 2024-06-02 DIAGNOSIS — S0093XA Contusion of unspecified part of head, initial encounter: Secondary | ICD-10-CM | POA: Diagnosis present

## 2024-06-02 DIAGNOSIS — S72012A Unspecified intracapsular fracture of left femur, initial encounter for closed fracture: Principal | ICD-10-CM | POA: Diagnosis present

## 2024-06-02 DIAGNOSIS — E559 Vitamin D deficiency, unspecified: Secondary | ICD-10-CM | POA: Diagnosis present

## 2024-06-02 DIAGNOSIS — R55 Syncope and collapse: Secondary | ICD-10-CM | POA: Diagnosis not present

## 2024-06-02 DIAGNOSIS — Y92002 Bathroom of unspecified non-institutional (private) residence single-family (private) house as the place of occurrence of the external cause: Secondary | ICD-10-CM

## 2024-06-02 DIAGNOSIS — R001 Bradycardia, unspecified: Secondary | ICD-10-CM | POA: Diagnosis not present

## 2024-06-02 DIAGNOSIS — M25532 Pain in left wrist: Secondary | ICD-10-CM | POA: Diagnosis not present

## 2024-06-02 DIAGNOSIS — R601 Generalized edema: Secondary | ICD-10-CM | POA: Diagnosis not present

## 2024-06-02 DIAGNOSIS — M1000 Idiopathic gout, unspecified site: Secondary | ICD-10-CM

## 2024-06-02 DIAGNOSIS — D649 Anemia, unspecified: Secondary | ICD-10-CM | POA: Diagnosis not present

## 2024-06-02 DIAGNOSIS — I129 Hypertensive chronic kidney disease with stage 1 through stage 4 chronic kidney disease, or unspecified chronic kidney disease: Secondary | ICD-10-CM | POA: Diagnosis not present

## 2024-06-02 DIAGNOSIS — Z471 Aftercare following joint replacement surgery: Secondary | ICD-10-CM | POA: Diagnosis not present

## 2024-06-02 DIAGNOSIS — M25462 Effusion, left knee: Secondary | ICD-10-CM | POA: Diagnosis not present

## 2024-06-02 DIAGNOSIS — D509 Iron deficiency anemia, unspecified: Secondary | ICD-10-CM | POA: Diagnosis not present

## 2024-06-02 DIAGNOSIS — F32A Depression, unspecified: Secondary | ICD-10-CM | POA: Diagnosis not present

## 2024-06-02 DIAGNOSIS — N1831 Chronic kidney disease, stage 3a: Secondary | ICD-10-CM | POA: Diagnosis present

## 2024-06-02 DIAGNOSIS — G9389 Other specified disorders of brain: Secondary | ICD-10-CM | POA: Diagnosis not present

## 2024-06-02 DIAGNOSIS — Z79899 Other long term (current) drug therapy: Secondary | ICD-10-CM

## 2024-06-02 DIAGNOSIS — Z7982 Long term (current) use of aspirin: Secondary | ICD-10-CM | POA: Diagnosis not present

## 2024-06-02 DIAGNOSIS — E1122 Type 2 diabetes mellitus with diabetic chronic kidney disease: Secondary | ICD-10-CM | POA: Diagnosis not present

## 2024-06-02 DIAGNOSIS — D696 Thrombocytopenia, unspecified: Secondary | ICD-10-CM | POA: Diagnosis present

## 2024-06-02 DIAGNOSIS — N179 Acute kidney failure, unspecified: Secondary | ICD-10-CM | POA: Diagnosis present

## 2024-06-02 DIAGNOSIS — E119 Type 2 diabetes mellitus without complications: Secondary | ICD-10-CM

## 2024-06-02 DIAGNOSIS — K219 Gastro-esophageal reflux disease without esophagitis: Secondary | ICD-10-CM | POA: Diagnosis not present

## 2024-06-02 DIAGNOSIS — N1832 Chronic kidney disease, stage 3b: Secondary | ICD-10-CM | POA: Diagnosis present

## 2024-06-02 DIAGNOSIS — Z8673 Personal history of transient ischemic attack (TIA), and cerebral infarction without residual deficits: Secondary | ICD-10-CM

## 2024-06-02 DIAGNOSIS — Z7401 Bed confinement status: Secondary | ICD-10-CM | POA: Diagnosis not present

## 2024-06-02 DIAGNOSIS — E1121 Type 2 diabetes mellitus with diabetic nephropathy: Secondary | ICD-10-CM

## 2024-06-02 DIAGNOSIS — I517 Cardiomegaly: Secondary | ICD-10-CM | POA: Diagnosis not present

## 2024-06-02 DIAGNOSIS — Z7984 Long term (current) use of oral hypoglycemic drugs: Secondary | ICD-10-CM | POA: Diagnosis not present

## 2024-06-02 DIAGNOSIS — R29818 Other symptoms and signs involving the nervous system: Secondary | ICD-10-CM | POA: Diagnosis present

## 2024-06-02 DIAGNOSIS — I499 Cardiac arrhythmia, unspecified: Secondary | ICD-10-CM | POA: Diagnosis not present

## 2024-06-02 DIAGNOSIS — S72012D Unspecified intracapsular fracture of left femur, subsequent encounter for closed fracture with routine healing: Secondary | ICD-10-CM | POA: Diagnosis not present

## 2024-06-02 DIAGNOSIS — M4312 Spondylolisthesis, cervical region: Secondary | ICD-10-CM | POA: Diagnosis not present

## 2024-06-02 DIAGNOSIS — S32592A Other specified fracture of left pubis, initial encounter for closed fracture: Secondary | ICD-10-CM | POA: Diagnosis present

## 2024-06-02 DIAGNOSIS — H548 Legal blindness, as defined in USA: Secondary | ICD-10-CM | POA: Diagnosis present

## 2024-06-02 DIAGNOSIS — W19XXXA Unspecified fall, initial encounter: Secondary | ICD-10-CM | POA: Diagnosis not present

## 2024-06-02 DIAGNOSIS — H5316 Psychophysical visual disturbances: Secondary | ICD-10-CM | POA: Diagnosis not present

## 2024-06-02 DIAGNOSIS — E785 Hyperlipidemia, unspecified: Secondary | ICD-10-CM | POA: Diagnosis present

## 2024-06-02 DIAGNOSIS — H401134 Primary open-angle glaucoma, bilateral, indeterminate stage: Secondary | ICD-10-CM | POA: Diagnosis present

## 2024-06-02 DIAGNOSIS — M109 Gout, unspecified: Secondary | ICD-10-CM | POA: Diagnosis not present

## 2024-06-02 DIAGNOSIS — Z96642 Presence of left artificial hip joint: Secondary | ICD-10-CM | POA: Diagnosis not present

## 2024-06-02 DIAGNOSIS — S199XXA Unspecified injury of neck, initial encounter: Secondary | ICD-10-CM | POA: Diagnosis not present

## 2024-06-02 DIAGNOSIS — S72002A Fracture of unspecified part of neck of left femur, initial encounter for closed fracture: Principal | ICD-10-CM

## 2024-06-02 DIAGNOSIS — I10 Essential (primary) hypertension: Secondary | ICD-10-CM | POA: Diagnosis present

## 2024-06-02 DIAGNOSIS — M25552 Pain in left hip: Secondary | ICD-10-CM | POA: Diagnosis not present

## 2024-06-02 DIAGNOSIS — H543 Unqualified visual loss, both eyes: Secondary | ICD-10-CM | POA: Diagnosis present

## 2024-06-02 DIAGNOSIS — S0990XA Unspecified injury of head, initial encounter: Secondary | ICD-10-CM | POA: Diagnosis not present

## 2024-06-02 DIAGNOSIS — W010XXA Fall on same level from slipping, tripping and stumbling without subsequent striking against object, initial encounter: Secondary | ICD-10-CM | POA: Diagnosis present

## 2024-06-02 DIAGNOSIS — S32502A Unspecified fracture of left pubis, initial encounter for closed fracture: Secondary | ICD-10-CM | POA: Diagnosis not present

## 2024-06-02 DIAGNOSIS — M47812 Spondylosis without myelopathy or radiculopathy, cervical region: Secondary | ICD-10-CM | POA: Diagnosis not present

## 2024-06-02 DIAGNOSIS — Z743 Need for continuous supervision: Secondary | ICD-10-CM | POA: Diagnosis not present

## 2024-06-02 DIAGNOSIS — M25512 Pain in left shoulder: Secondary | ICD-10-CM | POA: Diagnosis not present

## 2024-06-02 DIAGNOSIS — M1812 Unilateral primary osteoarthritis of first carpometacarpal joint, left hand: Secondary | ICD-10-CM | POA: Diagnosis not present

## 2024-06-02 DIAGNOSIS — S72042A Displaced fracture of base of neck of left femur, initial encounter for closed fracture: Secondary | ICD-10-CM | POA: Diagnosis not present

## 2024-06-02 DIAGNOSIS — R9082 White matter disease, unspecified: Secondary | ICD-10-CM | POA: Diagnosis not present

## 2024-06-02 DIAGNOSIS — Z96652 Presence of left artificial knee joint: Secondary | ICD-10-CM | POA: Diagnosis not present

## 2024-06-02 DIAGNOSIS — Z043 Encounter for examination and observation following other accident: Secondary | ICD-10-CM | POA: Diagnosis not present

## 2024-06-02 LAB — COMPREHENSIVE METABOLIC PANEL WITH GFR
ALT: 10 U/L (ref 0–44)
AST: 22 U/L (ref 15–41)
Albumin: 3 g/dL — ABNORMAL LOW (ref 3.5–5.0)
Alkaline Phosphatase: 90 U/L (ref 38–126)
Anion gap: 8 (ref 5–15)
BUN: 25 mg/dL — ABNORMAL HIGH (ref 8–23)
CO2: 23 mmol/L (ref 22–32)
Calcium: 8.9 mg/dL (ref 8.9–10.3)
Chloride: 107 mmol/L (ref 98–111)
Creatinine, Ser: 1.34 mg/dL — ABNORMAL HIGH (ref 0.44–1.00)
GFR, Estimated: 38 mL/min — ABNORMAL LOW (ref >60)
Glucose, Bld: 154 mg/dL — ABNORMAL HIGH (ref 70–99)
Potassium: 4 mmol/L (ref 3.5–5.1)
Sodium: 138 mmol/L (ref 135–145)
Total Bilirubin: 0.5 mg/dL (ref 0.0–1.2)
Total Protein: 6.6 g/dL (ref 6.5–8.1)

## 2024-06-02 LAB — CBC WITH DIFFERENTIAL/PLATELET
Abs Immature Granulocytes: 0.05 10*3/uL (ref 0.00–0.07)
Basophils Absolute: 0 10*3/uL (ref 0.0–0.1)
Basophils Relative: 0 %
Eosinophils Absolute: 0.1 10*3/uL (ref 0.0–0.5)
Eosinophils Relative: 1 %
HCT: 31.5 % — ABNORMAL LOW (ref 36.0–46.0)
Hemoglobin: 10 g/dL — ABNORMAL LOW (ref 12.0–15.0)
Immature Granulocytes: 1 %
Lymphocytes Relative: 10 %
Lymphs Abs: 0.8 10*3/uL (ref 0.7–4.0)
MCH: 27.3 pg (ref 26.0–34.0)
MCHC: 31.7 g/dL (ref 30.0–36.0)
MCV: 86.1 fL (ref 80.0–100.0)
Monocytes Absolute: 0.3 10*3/uL (ref 0.1–1.0)
Monocytes Relative: 3 %
Neutro Abs: 6.6 10*3/uL (ref 1.7–7.7)
Neutrophils Relative %: 85 %
Platelets: UNDETERMINED 10*3/uL (ref 150–400)
RBC: 3.66 MIL/uL — ABNORMAL LOW (ref 3.87–5.11)
RDW: 17.2 % — ABNORMAL HIGH (ref 11.5–15.5)
WBC: 7.8 10*3/uL (ref 4.0–10.5)
nRBC: 0 % (ref 0.0–0.2)

## 2024-06-02 LAB — I-STAT CHEM 8, ED
BUN: 23 mg/dL (ref 8–23)
Calcium, Ion: 1.08 mmol/L — ABNORMAL LOW (ref 1.15–1.40)
Chloride: 109 mmol/L (ref 98–111)
Creatinine, Ser: 1.3 mg/dL — ABNORMAL HIGH (ref 0.44–1.00)
Glucose, Bld: 147 mg/dL — ABNORMAL HIGH (ref 70–99)
HCT: 31 % — ABNORMAL LOW (ref 36.0–46.0)
Hemoglobin: 10.5 g/dL — ABNORMAL LOW (ref 12.0–15.0)
Potassium: 3.9 mmol/L (ref 3.5–5.1)
Sodium: 140 mmol/L (ref 135–145)
TCO2: 18 mmol/L — ABNORMAL LOW (ref 22–32)

## 2024-06-02 MED ORDER — SODIUM CHLORIDE 0.9 % IV SOLN: INTRAVENOUS | Status: AC

## 2024-06-02 MED ORDER — ACETAMINOPHEN 325 MG PO TABS
650.0000 mg | ORAL_TABLET | Freq: Once | ORAL | Status: AC
  Administered 2024-06-02: 650 mg via ORAL
  Filled 2024-06-02: qty 2

## 2024-06-02 MED ORDER — CARVEDILOL 12.5 MG PO TABS: 25.0000 mg | ORAL_TABLET | Freq: Two times a day (BID) | ORAL | Status: DC

## 2024-06-02 MED ORDER — METFORMIN HCL 500 MG PO TABS: 500.0000 mg | ORAL_TABLET | Freq: Every day | ORAL | Status: DC

## 2024-06-02 MED ORDER — GABAPENTIN 100 MG PO CAPS
100.0000 mg | ORAL_CAPSULE | Freq: Two times a day (BID) | ORAL | Status: DC
  Administered 2024-06-02 – 2024-06-04 (×3): 100 mg via ORAL
  Filled 2024-06-02 (×3): qty 1

## 2024-06-02 MED ORDER — DORZOLAMIDE HCL-TIMOLOL MAL 2-0.5 % OP SOLN
1.0000 [drp] | Freq: Two times a day (BID) | OPHTHALMIC | Status: DC
  Administered 2024-06-02 – 2024-06-06 (×7): 1 [drp] via OPHTHALMIC
  Filled 2024-06-02 (×2): qty 10

## 2024-06-02 MED ORDER — PANTOPRAZOLE SODIUM 40 MG PO TBEC
40.0000 mg | DELAYED_RELEASE_TABLET | Freq: Every day | ORAL | Status: DC
  Administered 2024-06-04 – 2024-06-06 (×3): 40 mg via ORAL
  Filled 2024-06-02 (×3): qty 1

## 2024-06-02 MED ORDER — POLYETHYLENE GLYCOL 3350 17 G PO PACK: 17.0000 g | PACK | Freq: Every day | ORAL | Status: DC | PRN

## 2024-06-02 MED ORDER — ALLOPURINOL 100 MG PO TABS
50.0000 mg | ORAL_TABLET | Freq: Every day | ORAL | Status: DC
  Administered 2024-06-04: 50 mg via ORAL
  Filled 2024-06-02: qty 1

## 2024-06-02 MED ORDER — LATANOPROST 0.005 % OP SOLN
1.0000 [drp] | Freq: Every day | OPHTHALMIC | Status: DC
  Administered 2024-06-02 – 2024-06-05 (×4): 1 [drp] via OPHTHALMIC
  Filled 2024-06-02 (×2): qty 2.5

## 2024-06-02 MED ORDER — TRAMADOL HCL 50 MG PO TABS
50.0000 mg | ORAL_TABLET | Freq: Four times a day (QID) | ORAL | Status: DC | PRN
  Administered 2024-06-03: 50 mg via ORAL
  Filled 2024-06-02: qty 1

## 2024-06-02 MED ORDER — ROSUVASTATIN CALCIUM 5 MG PO TABS
5.0000 mg | ORAL_TABLET | Freq: Every day | ORAL | Status: DC
  Administered 2024-06-04 – 2024-06-06 (×3): 5 mg via ORAL
  Filled 2024-06-02 (×3): qty 1

## 2024-06-02 MED ORDER — AMLODIPINE BESYLATE 10 MG PO TABS
10.0000 mg | ORAL_TABLET | Freq: Every day | ORAL | Status: DC
  Administered 2024-06-04 – 2024-06-06 (×3): 10 mg via ORAL
  Filled 2024-06-02 (×3): qty 1

## 2024-06-02 MED ORDER — ATROPINE SULFATE 1 % OP SOLN
1.0000 [drp] | Freq: Every day | OPHTHALMIC | Status: DC
  Administered 2024-06-04 – 2024-06-06 (×2): 1 [drp] via OPHTHALMIC
  Filled 2024-06-02: qty 2

## 2024-06-02 MED ORDER — POLYVINYL ALCOHOL 1.4 % OP SOLN
1.0000 [drp] | OPHTHALMIC | Status: DC | PRN
  Administered 2024-06-02: 1 [drp] via OPHTHALMIC
  Filled 2024-06-02: qty 15

## 2024-06-02 MED ORDER — ENSURE PRE-SURGERY PO LIQD
296.0000 mL | Freq: Once | ORAL | Status: AC
  Administered 2024-06-03: 296 mL via ORAL
  Filled 2024-06-02: qty 296

## 2024-06-02 MED ORDER — SERTRALINE HCL 100 MG PO TABS
100.0000 mg | ORAL_TABLET | Freq: Every day | ORAL | Status: DC
  Administered 2024-06-02 – 2024-06-05 (×4): 100 mg via ORAL
  Filled 2024-06-02 (×4): qty 1

## 2024-06-02 NOTE — ED Notes (Signed)
 Called lab to see if they had patients blood, stated they have it and working on it

## 2024-06-02 NOTE — Telephone Encounter (Signed)
Erroneous note. Please disregard.

## 2024-06-02 NOTE — H&P (Signed)
 History and Physical   Breanna [name] ZOX:[id number] DOB: 11/22/1931 DOA: 06/02/2024  PCP: Genia Kettering, MD   Patient coming from: Home  Chief Complaint: Fall  HPI: Breanna [name] is a 89 y.o. female with medical history significant of hypertension, hyperlipidemia, CKD 3B, anemia, CVA, GERD, gout, diabetes, depression, parkinsonian features, glaucoma, blindness, [name] presenting after fall at home.  Patient was reportedly heading to the bathroom and reaching out for the commode when she slipped and fell.  She landed on her left side and did hit her head on the ground.  Did briefly lose consciousness.  Afterwards reported left hip pain and has not ambulated since.  Contusion to her head and also noted some mild knee swelling.  EMS reported heart rate in the 40s.  Denies fevers, chills, chest pain, shortness of breath, abdominal pain, constipation, diarrhea, nausea, vomiting.  ED Course: Vital signs in the ED notable for blood pressure in the 110s-170s systolic, heart rate in the 50s and 60s respiratory rate in the teens-20s.  Lab workup included CMP with BUN 25, creatinine elevated to 1.34 from baseline of 1, glucose 154, albumin 3.0.  CBC with hemoglobin stable at 10, platelets clumped.  Imaging workup included CT head and CT C-spine which showed no acute abnormalities but did show chronic changes.  Chest x-ray, left shoulder x-ray, left wrist x-ray showed no acute abnormality.  Left knee x-ray showed no acute normality but did show effusion.  Left hip x-ray showed probable mildly displaced left subcapital fracture.  CT of the left hip demonstrated mild impacted subcapital neck fracture as well as 2 rami fractures and some subcutaneous edema.    Patient received Tylenol  only in the ED.  Orthopedic surgery consulted and plan for surgical correction tomorrow.  Review of Systems: As per HPI otherwise all other systems reviewed and are negative.  Past Medical History:   Diagnosis Date   Adrenal adenoma 12/24/2004   Boils 12/25/2007   Cholelithiasis    asympt. w/normal HIDA 03/2010   CVA (cerebral infarction) 12/24/2008   Cerebellar   Diverticulitis    Esophageal spasm 12/24/2009   GERD (gastroesophageal reflux disease)    Gout    History of colon polyps    HTN (hypertension)    Hydronephrosis    LEFT/ Surgical intervention   Hyperlipidemia    LBP (low back pain)    Osteoarthritis    Pulmonary HTN (HCC)    Stress    Type II or unspecified type diabetes mellitus without mention of complication, not stated as uncontrolled     Past Surgical History:  Procedure Laterality Date   ABDOMINAL HYSTERECTOMY     APPENDECTOMY     2020   BACK SURGERY     BREAST BIOPSY     RIGHT   CATARACT EXTRACTION, BILATERAL     COLECTOMY  2006   Sigmoid   COLONOSCOPY Left 01/20/2024   Procedure: COLONOSCOPY;  Surgeon: Tobin Forts, MD;  Location: [facility];  Service: Gastroenterology;  Laterality: Left;   ESOPHAGOGASTRODUODENOSCOPY Left 01/19/2024   Procedure: ESOPHAGOGASTRODUODENOSCOPY (EGD);  Surgeon: Annis Kinder, DO;  Location: [facility];  Service: Gastroenterology;  Laterality: Left;   FOOT SURGERY     BILATERAL   HAMMER TOE SURGERY     INTRAMEDULLARY (IM) NAIL INTERTROCHANTERIC Right 07/21/2020   Procedure: INTRAMEDULLARY (IM) NAIL INTERTROCHANTRIC;  Surgeon: Saundra Curl, MD;  Location: [facility];  Service: Orthopedics;  Laterality: Right;   ROTATOR CUFF REPAIR  2008  RIGHT   TOTAL KNEE ARTHROPLASTY  2003   LEFT   VARICOSE VEIN SURGERY     vein stripping/lower extremities    Social History  reports that she has never smoked. She has never used smokeless tobacco. She reports that she does not drink alcohol  and does not use drugs.  Allergies  Allergen Reactions   Verapamil Shortness Of Breath    REACTION: SOB   Aspirin  Itching    But tolerates low dose   Atenolol     REACTION: fatigue   Codeine Itching   Codeine Sulfate     Hydrochlorothiazide     REACTION: gout   Hydrocodone        side effects - hallucinations   Ibuprofen      Upset stomach w/high doses   Lisinopril     REACTION: cough   Onion Other (See Comments)    Dry mouth/ gets sores   Shellfish Allergy Swelling    Patient stated she does not eat shellfish, she swells up on different parts of the body   Statins     Dizzy, falls   Valsartan Itching   Adhesive  [Tape] Rash   Other Rash    Family History  Problem Relation Age of Onset   Prostate cancer Father    Hypertension Father    Cancer Father        prostate   Heart disease Mother    Diabetes Mother    Diabetes Other        1st degree relative   Heart disease Other    Hypertension Other    Prostate cancer Maternal Uncle    Cancer Maternal Uncle        prostate   Breast cancer Daughter    Cancer Daughter        breast   Colon cancer Neg Hx   Reviewed on admission  Prior to Admission medications   Medication Sig Start Date End Date Taking? Authorizing Provider  acetaminophen  (TYLENOL ) 325 MG tablet Take 2 tablets (650 mg total) by mouth every 6 (six) hours as needed for moderate pain or fever. 07/29/20  Yes Garnet Just, MD  allopurinol  (ZYLOPRIM ) 100 MG tablet Take 0.5 tablets (50 mg total) by mouth daily. Patient taking differently: Take 300 mg by mouth daily. 05/22/24  Yes Plotnikov, Oakley Bellman, MD  amLODipine  (NORVASC ) 10 MG tablet Take 1 tablet (10 mg total) by mouth daily. 04/23/24  Yes Clearnce Curia, NP  aspirin  EC 81 MG tablet Take 81 mg by mouth daily. Swallow whole.   Yes [provider]  atropine  1 % ophthalmic solution Place 1 drop into both eyes daily. 01/16/21  Yes [provider]  calcium  carbonate (OSCAL) 1500 (600 Ca) MG TABS tablet Take 1,500 mg by mouth daily.   Yes [provider]  carvedilol  (COREG ) 25 MG tablet Take 25 mg by mouth 2 (two) times daily with a meal.   Yes [provider]  [employer] VITAMIN B12 1000 MCG tablet TAKE 1  TABLET (1,000 MCG TOTAL) BY MOUTH EVERY DAY 05/13/23  Yes Plotnikov, Aleksei V, MD  docusate sodium  (COLACE) 100 MG capsule TAKE 1 CAPSULE BY MOUTH TWICE A DAY 04/19/24  Yes Plotnikov, Aleksei V, MD  dorzolamide -timolol  (COSOPT ) 22.3-6.8 MG/ML ophthalmic solution Place 1 drop into both eyes 2 (two) times daily.    Yes [provider]  gabapentin  (NEURONTIN ) 100 MG capsule Take 1 capsule (100 mg total) by mouth 2 (two) times daily. 08/12/23  Yes Plotnikov, Aleksei  V, MD  latanoprost  (XALATAN ) 0.005 % ophthalmic solution Place 1 drop into both eyes at bedtime.  04/26/16  Yes [provider]  losartan  (COZAAR ) 50 MG tablet TAKE 1 TABLET BY MOUTH EVERY DAY 04/27/24  Yes Plotnikov, Aleksei V, MD  metFORMIN  (GLUCOPHAGE ) 500 MG tablet Take 500 mg by mouth daily with breakfast.   Yes [provider]  pantoprazole  (PROTONIX ) 40 MG tablet TAKE 1 TABLET BY MOUTH EVERY DAY 11/14/23  Yes Plotnikov, Aleksei V, MD  rosuvastatin  (CRESTOR ) 5 MG tablet Take 5 mg by mouth daily. 11/11/23  Yes [provider]  sertraline  (ZOLOFT ) 100 MG tablet Take 1 tablet (100 mg total) by mouth at bedtime. 02/26/24  Yes Plotnikov, Oakley Bellman, MD  spironolactone  (ALDACTONE ) 25 MG tablet Take 25 mg by mouth 3 (three) times a week. 04/17/24  Yes [provider]  torsemide  (DEMADEX ) 20 MG tablet Take 20 mg by mouth daily.   Yes [provider]  traMADol  (ULTRAM ) 50 MG tablet Take 50 mg by mouth every 6 (six) hours as needed for moderate pain (pain score 4-6).   Yes [provider]  brimonidine  (ALPHAGAN ) 0.2 % ophthalmic solution Place 1 drop into the right eye 2 (two) times daily. Patient not taking: Reported on 06/02/2024    [provider]  glucose blood (GNP TRUE METRIX GLUCOSE STRIPS) test strip Use to check blood sugars twice a day 04/26/21   Plotnikov, Aleksei V, MD  Netarsudil-Latanoprost  0.02-0.005 % SOLN Apply to eye. Patient not taking: Reported on 06/02/2024     [provider]    Physical Exam: Vitals:   06/02/24 1730 06/02/24 1745 06/02/24 1756 06/02/24 1800  BP: (!) 157/104 130/75  (!) 132/120  Pulse: 73 62  (!) 58  Resp: (!) 24 (!) 25  17  Temp:   99.4 F (37.4 C)   TempSrc:   Oral   SpO2: 100% 100%  100%  Weight:      Height:        Physical Exam Constitutional:      General: She is not in acute distress.    Appearance: Normal appearance.  HENT:     Head: Normocephalic and atraumatic.     Mouth/Throat:     Mouth: Mucous membranes are moist.     Pharynx: Oropharynx is clear.  Eyes:     Extraocular Movements: Extraocular movements intact.     Pupils: Pupils are equal, round, and reactive to light.  Cardiovascular:     Rate and Rhythm: Normal rate and regular rhythm.     Pulses: Normal pulses.     Heart sounds: Normal heart sounds.  Pulmonary:     Effort: Pulmonary effort is normal. No respiratory distress.     Breath sounds: Normal breath sounds.  Abdominal:     General: Bowel sounds are normal. There is no distension.     Palpations: Abdomen is soft.     Tenderness: There is no abdominal tenderness.  Musculoskeletal:        General: No swelling or deformity.     Comments: Bilateral lower extremities neurovascularly intact Left lower extremity foreshortened and internally rotated  Skin:    General: Skin is warm and dry.  Neurological:     General: No focal deficit present.     Mental Status: Mental status is at baseline.     Labs on Admission: I have personally reviewed following labs and imaging studies  CBC: Recent Labs  Lab 06/02/24 1419 06/02/24 1724  WBC  7.8  --   NEUTROABS 6.6  --   HGB 10.0* 10.5*  HCT 31.5* 31.0*  MCV 86.1  --   PLT PLATELET CLUMPS NOTED ON SMEAR, UNABLE TO ESTIMATE  --     Basic Metabolic Panel: Recent Labs  Lab 06/02/24 1609 06/02/24 1724  NA 138 140  K 4.0 3.9  CL 107 109  CO2 23  --   GLUCOSE 154* 147*  BUN 25* 23  CREATININE 1.34* 1.30*  CALCIUM  8.9  --      GFR: Estimated Creatinine Clearance: 22.2 mL/min (A) (by C-G formula based on SCr of 1.3 mg/dL (H)).  Liver Function Tests: Recent Labs  Lab 06/02/24 1609  AST 22  ALT 10  ALKPHOS 90  BILITOT 0.5  PROT 6.6  ALBUMIN 3.0*    Urine analysis:    Component Value Date/Time   COLORURINE STRAW (A) 01/17/2024 1858   APPEARANCEUR CLEAR 01/17/2024 1858   LABSPEC 1.015 01/17/2024 1858   PHURINE 5.0 01/17/2024 1858   GLUCOSEU NEGATIVE 01/17/2024 1858   GLUCOSEU NEGATIVE 10/16/2023 1248   HGBUR NEGATIVE 01/17/2024 1858   BILIRUBINUR NEGATIVE 01/17/2024 1858   KETONESUR NEGATIVE 01/17/2024 1858   PROTEINUR 30 (A) 01/17/2024 1858   UROBILINOGEN 0.2 10/16/2023 1248   NITRITE NEGATIVE 01/17/2024 1858   LEUKOCYTESUR NEGATIVE 01/17/2024 1858    Radiological Exams on Admission: CT HIP LEFT WO CONTRAST Result Date: 06/02/2024 CLINICAL DATA:  Hip trauma, fracture suspected. EXAM: CT OF THE LEFT HIP WITHOUT CONTRAST TECHNIQUE: Multidetector CT imaging of the left hip was performed according to the standard protocol. Multiplanar CT image reconstructions were also generated. RADIATION DOSE REDUCTION: This exam was performed according to the departmental dose-optimization program which includes automated exposure control, adjustment of the mA and/or kV according to patient size and/or use of iterative reconstruction technique. COMPARISON:  Pelvic and left hip radiographs same date. CT of the right hip 07/20/2020. PET-CT 09/02/2017. FINDINGS: Bones/Joint/Cartilage The technologist deviated from protocol and saved axial images through the entire pelvis. The usual small field-of-view coronal and sagittal images through the left hip are provided. The bones are mildly demineralized. There is a mildly impacted subcapital fracture of the left femoral neck. There are nondisplaced acute parasymphyseal pubic rami fractures on the left. There are sacroiliac degenerative changes bilaterally without diastasis of  the sacroiliac joints or symphysis pubis. Status post proximal right femoral ORIF with a healed fracture and posttraumatic deformity. Previous L4-5 fusion with multilevel lumbar spondylosis. Ligaments Suboptimally assessed by CT. Muscles and Tendons The pelvic and left hip musculature appears normal. Soft tissues Generalized subcutaneous edema throughout the pelvic fat with mild asymmetric involvement lateral to the proximal left femur. No focal hematoma, unexpected foreign body or soft tissue emphysema demonstrated. There are postsurgical changes within the low anterior abdominal wall. Calcified gallstones noted. IMPRESSION: 1. Mildly impacted subcapital fracture of the left femoral neck. 2. Nondisplaced acute parasymphyseal pubic rami fractures on the left. 3. Generalized subcutaneous edema throughout the pelvic fat with mild asymmetric involvement lateral to the proximal left femur. No focal hematoma, unexpected foreign body or soft tissue emphysema demonstrated. 4. Cholelithiasis. Electronically Signed   By: Elmon Hagedorn M.D.   On: 06/02/2024 17:00   DG Wrist Complete Left Result Date: 06/02/2024 CLINICAL DATA:  Left wrist pain after fall today. EXAM: LEFT WRIST - COMPLETE 3+ VIEW COMPARISON:  None Available. FINDINGS: There is no evidence of fracture or dislocation. Severe degenerative changes seen involving the first carpometacarpal joint. Soft tissues are unremarkable. IMPRESSION:  Severe osteoarthritis of first carpometacarpal joint. No acute abnormality seen. Electronically Signed   By: Rosalene Colon M.D.   On: 06/02/2024 15:43   DG Shoulder Left Result Date: 06/02/2024 CLINICAL DATA:  Left shoulder pain after fall today. EXAM: LEFT SHOULDER - 2+ VIEW COMPARISON:  None Available. FINDINGS: There is no evidence of fracture or dislocation. Moderate degenerative changes seen involving the left acromioclavicular joint. Soft tissues are unremarkable. IMPRESSION: Moderate degenerative joint disease of  left acromioclavicular joint. No acute abnormality seen. Electronically Signed   By: Rosalene Colon M.D.   On: 06/02/2024 15:41   DG Knee Complete 4 Views Left Result Date: 06/02/2024 CLINICAL DATA:  Status post fall EXAM: LEFT KNEE - COMPLETE 4+ VIEW COMPARISON:  January 17, 2011 FINDINGS: Total left knee arthroplasty with near anatomic alignment No fractures Minimal suprapatellar joint effusion and infrapatellar swelling IMPRESSION: Total left knee arthroplasty with near anatomic alignment. Electronically Signed   By: Fredrich Jefferson M.D.   On: 06/02/2024 15:39   DG Hip Unilat W or Wo Pelvis 2-3 Views Left Result Date: 06/02/2024 CLINICAL DATA:  Left hip pain after fall. EXAM: DG HIP (WITH OR WITHOUT PELVIS) 2-3V LEFT COMPARISON:  July 20, 2020. FINDINGS: Status post surgical internal fixation of old proximal right femoral fracture. Probable mildly displaced subcapital fracture of proximal left femur is noted. No dislocation is noted. IMPRESSION: Probable mildly displaced proximal left femoral subcapital fracture. CT may be performed for further evaluation. Electronically Signed   By: Rosalene Colon M.D.   On: 06/02/2024 15:06   DG Chest 1 View Result Date: 06/02/2024 CLINICAL DATA:  Left hip pain after fall today. EXAM: CHEST  1 VIEW COMPARISON:  January 17, 2024. FINDINGS: Stable cardiomegaly. Both lungs are clear. The visualized skeletal structures are unremarkable. IMPRESSION: No active disease. Electronically Signed   By: Rosalene Colon M.D.   On: 06/02/2024 15:02   CT HEAD WO CONTRAST ( ) Result Date: 06/02/2024 CLINICAL DATA:  Head trauma, minor, normal mental status (Age 17-64y) EXAM: CT HEAD WITHOUT CONTRAST TECHNIQUE: Contiguous axial images were obtained from the base of the skull through the vertex without intravenous contrast. RADIATION DOSE REDUCTION: This exam was performed according to the departmental dose-optimization program which includes automated exposure control, adjustment of  the mA and/or kV according to patient size and/or use of iterative reconstruction technique. COMPARISON:  CT of the head dated December 26, 2018. FINDINGS: Brain: Chronic encephalomalacia changes in the left cerebellar hemisphere. Age-related cerebral volume loss and mild to moderate periventricular and deep cerebral white matter disease. No evidence of hemorrhage, mass, acute cortical infarct or hydrocephalus. Vascular: Mild calcific atheromatous disease within the carotid siphons and vertebral arteries. Skull: Intact. Sinuses/Orbits: Status post bilateral cataract surgery. Visualized paranasal sinuses and mastoid air cells are clear. Other: None. IMPRESSION: 1. Chronic encephalomalacia changes within the left cerebellar hemisphere and moderate cerebral white matter disease. No apparent acute process. Electronically Signed   By: Maribeth Shivers M.D.   On: 06/02/2024 15:00   CT Cervical Spine Wo Contrast Result Date: 06/02/2024 CLINICAL DATA:  Neck trauma. EXAM: CT CERVICAL SPINE WITHOUT CONTRAST TECHNIQUE: Multidetector CT imaging of the cervical spine was performed without intravenous contrast. Multiplanar CT image reconstructions were also generated. RADIATION DOSE REDUCTION: This exam was performed according to the departmental dose-optimization program which includes automated exposure control, adjustment of the mA and/or kV according to patient size and/or use of iterative reconstruction technique. COMPARISON:  Cervical spine radiographs 11/22/2006. FINDINGS: Alignment:  Minimal stepwise anterolisthesis at C3-4 and C4-5. No focal angulation. Skull base and vertebrae: No evidence of acute cervical spine fracture or traumatic subluxation. Soft tissues and spinal canal: No prevertebral fluid or swelling. No visible canal hematoma. Disc levels: Multilevel spondylosis with disc space narrowing and uncinate spurring most advanced at C5-6 and C6-7. Multilevel mild-to-moderate foraminal narrowing, greatest on the  right at C5-6. Multilevel facet arthropathy. No large disc herniation or high-grade central stenosis identified. Upper chest: Clear lung apices. Other: Mild carotid atherosclerosis. Mild thyroid  heterogeneity, similar to previous chest CTA 03/01/2023. Given the patient's age, no followup recommended unless clinically warranted.(Ref: J Am Coll Radiol. 2015 Feb;12(2): 143-50). IMPRESSION: 1. No evidence of acute cervical spine fracture, traumatic subluxation or static signs of instability. 2. Multilevel cervical spondylosis as described. Electronically Signed   By: Elmon Hagedorn M.D.   On: 06/02/2024 14:50   EKG: Independently reviewed.  With sinus arrhythmia versus PAC.  Baseline artifact limiting interpretation.  Nonspecific T wave changes.  Assessment/Plan Active Problems:   DM2 (diabetes mellitus, type 2) (HCC)   Gout   HTN (hypertension)   History of cardioembolic cerebrovascular accident (CVA)   GERD (gastroesophageal reflux disease)   Primary open angle glaucoma of both eyes, indeterminate stage   Normocytic anemia   Hyperlipidemia   Parkinsonian features   Stage 3b chronic kidney disease (CKD) (HCC)   Depression   Charles Bonnet syndrome   Blindness of both eyes   Fall Left hip/femur fracture > Fell at home after slipping and landing on left side. > Extensive imaging in the ED showed no acute abnormality on, CT C-spine, chest x-ray, left shoulder x-ray, left wrist x-ray, left knee x-ray. > X-ray of the left hip and CT left hip confirmed left femoral subcapital fracture.  CT additionally noted pubic rami fractures and subcutaneous edema. > Patient only requiring Tylenol  for pain control.  Orthopedic surgery consulted plan for surgical correction tomorrow. - Monitor on telemetry overnight given bradycardia - Appreciate orthopedic surgery recommendations and assistance - Bedrest - N.p.o. at midnight - As needed Tylenol  for mild pain and tramadol  for moderate to severe pain - Hip  fracture protocol  AKI on CKD 3 > Creatinine elevated to 1.34 from baseline of 1. 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  Has syndrome which she has hallucinations due to her vision loss that she knows are not real. - Continue home eyedrops  DVT prophylaxis: SCDs Code Status:   Full Family Communication:  Updated at bedside  Disposition Plan:   Patient is from:  Home  Anticipated DC to:  Home  Anticipated DC date:  2 to 3 days  Anticipated DC barriers: None  Consults called:  Orthopedic surgery Admission status:  Inpatient, telemetry  Severity of Illness: The appropriate patient status for this patient is INPATIENT. Inpatient status is judged to be reasonable and necessary in order to provide the required intensity of service to ensure the patient's safety. The patient's  presenting symptoms, physical exam findings, and initial radiographic and laboratory data in the context of their chronic comorbidities is felt to place them at high risk for further clinical deterioration. Furthermore, it is not anticipated that the patient will be medically stable for discharge from the hospital within 2 midnights of admission.   * I certify that at the point of admission it is my clinical judgment that the patient will require inpatient hospital care spanning beyond 2 midnights from the point of admission due to high  intensity of service, high risk for further deterioration and high frequency of surveillance required.Johnetta Nab MD [facility]  How to contact the [facility] Attending or Consulting provider 7A - 7P or covering provider during after hours 7P -7A, for this patient?   Check the care team in [facility] and look for a) attending/consulting [facility] provider listed and b) the [facility] team listed Log into [email or web address] and use [place]'s universal password to access. If you do not have the password, please contact the hospital operator. Locate the [facility] provider you are looking for under [facility] and page to a number that you can be directly reached. If you still have difficulty reaching the provider, please page the [facility] (Director on Call) for the Hospitalists listed on amion for assistance.  06/02/2024, 6:29 PM

## 2024-06-02 NOTE — Consult Note (Signed)
 Reason for Consult:Left hip fx Referring Physician: Will Scheving Time called: 1506 Time at bedside: [address] Breanna [name] is an 89 y.o. female.  HPI: Breanna [name] was going to the bathroom when she had acute left hip pain and fell. She was brought to the ED where x-rays showed a left hip fx and orthopedic surgery was consulted. She lives at home with her son and uses a RW for the limited ambulation she does.  Past Medical History:  Diagnosis Date   Adrenal adenoma 12/24/2004   Boils 12/25/2007   Cholelithiasis    asympt. w/normal HIDA 03/2010   CVA (cerebral infarction) 12/24/2008   Cerebellar   Diverticulitis    Esophageal spasm 12/24/2009   GERD (gastroesophageal reflux disease)    Gout    History of colon polyps    HTN (hypertension)    Hydronephrosis    LEFT/ Surgical intervention   Hyperlipidemia    LBP (low back pain)    Osteoarthritis    Pulmonary HTN (HCC)    Stress    Type II or unspecified type diabetes mellitus without mention of complication, not stated as uncontrolled     Past Surgical History:  Procedure Laterality Date   ABDOMINAL HYSTERECTOMY     APPENDECTOMY     2020   BACK SURGERY     BREAST BIOPSY     RIGHT   CATARACT EXTRACTION, BILATERAL     COLECTOMY  2006   Sigmoid   COLONOSCOPY Left 01/20/2024   Procedure: COLONOSCOPY;  Surgeon: Tobin Forts, MD;  Location: [facility];  Service: Gastroenterology;  Laterality: Left;   ESOPHAGOGASTRODUODENOSCOPY Left 01/19/2024   Procedure: ESOPHAGOGASTRODUODENOSCOPY (EGD);  Surgeon: Annis Kinder, DO;  Location: [facility];  Service: Gastroenterology;  Laterality: Left;   FOOT SURGERY     BILATERAL   HAMMER TOE SURGERY     INTRAMEDULLARY (IM) NAIL INTERTROCHANTERIC Right 07/21/2020   Procedure: INTRAMEDULLARY (IM) NAIL INTERTROCHANTRIC;  Surgeon: Saundra Curl, MD;  Location: [facility];  Service: Orthopedics;  Laterality: Right;   ROTATOR CUFF REPAIR  2008   RIGHT   TOTAL KNEE ARTHROPLASTY  2003   LEFT    VARICOSE VEIN SURGERY     vein stripping/lower extremities    Family History  Problem Relation Age of Onset   Prostate cancer Father    Hypertension Father    Cancer Father        prostate   Heart disease Mother    Diabetes Mother    Diabetes Other        1st degree relative   Heart disease Other    Hypertension Other    Prostate cancer Maternal Uncle    Cancer Maternal Uncle        prostate   Breast cancer Daughter    Cancer Daughter        breast   Colon cancer Neg Hx     Social History:  reports that she has never smoked. She has never used smokeless tobacco. She reports that she does not drink alcohol  and does not use drugs.  Allergies:  Allergies  Allergen Reactions   Verapamil Shortness Of Breath    REACTION: SOB   Aspirin  Itching    But tolerates low dose   Atenolol     REACTION: fatigue   Codeine Itching   Codeine Sulfate    Hydrochlorothiazide     REACTION: gout   Hydrocodone        side effects - hallucinations   Ibuprofen   Upset stomach w/high doses   Lisinopril     REACTION: cough   Onion Other (See Comments)    Dry mouth/ gets sores   Shellfish Allergy Swelling    Patient stated she does not eat shellfish, she swells up on different parts of the body   Statins     Dizzy, falls   Valsartan Itching   Adhesive  [Tape] Rash   Other Rash    Medications: I have reviewed the patient's current medications.  No results found for this or any previous visit (from the past 48 hours).  DG Hip Unilat W or Wo Pelvis 2-3 Views Left Result Date: 06/02/2024 CLINICAL DATA:  Left hip pain after fall. EXAM: DG HIP (WITH OR WITHOUT PELVIS) 2-3V LEFT COMPARISON:  July 20, 2020. FINDINGS: Status post surgical internal fixation of old proximal right femoral fracture. Probable mildly displaced subcapital fracture of proximal left femur is noted. No dislocation is noted. IMPRESSION: Probable mildly displaced proximal left femoral subcapital fracture. CT may be  performed for further evaluation. Electronically Signed   By: Rosalene Colon M.D.   On: 06/02/2024 15:06   DG Chest 1 View Result Date: 06/02/2024 CLINICAL DATA:  Left hip pain after fall today. EXAM: CHEST  1 VIEW COMPARISON:  January 17, 2024. FINDINGS: Stable cardiomegaly. Both lungs are clear. The visualized skeletal structures are unremarkable. IMPRESSION: No active disease. Electronically Signed   By: Rosalene Colon M.D.   On: 06/02/2024 15:02   CT HEAD WO CONTRAST ( ) Result Date: 06/02/2024 CLINICAL DATA:  Head trauma, minor, normal mental status (Age 56-64y) EXAM: CT HEAD WITHOUT CONTRAST TECHNIQUE: Contiguous axial images were obtained from the base of the skull through the vertex without intravenous contrast. RADIATION DOSE REDUCTION: This exam was performed according to the departmental dose-optimization program which includes automated exposure control, adjustment of the mA and/or kV according to patient size and/or use of iterative reconstruction technique. COMPARISON:  CT of the head dated December 26, 2018. FINDINGS: Brain: Chronic encephalomalacia changes in the left cerebellar hemisphere. Age-related cerebral volume loss and mild to moderate periventricular and deep cerebral white matter disease. No evidence of hemorrhage, mass, acute cortical infarct or hydrocephalus. Vascular: Mild calcific atheromatous disease within the carotid siphons and vertebral arteries. Skull: Intact. Sinuses/Orbits: Status post bilateral cataract surgery. Visualized paranasal sinuses and mastoid air cells are clear. Other: None. IMPRESSION: 1. Chronic encephalomalacia changes within the left cerebellar hemisphere and moderate cerebral white matter disease. No apparent acute process. Electronically Signed   By: Maribeth Shivers M.D.   On: 06/02/2024 15:00   CT Cervical Spine Wo Contrast Result Date: 06/02/2024 CLINICAL DATA:  Neck trauma. EXAM: CT CERVICAL SPINE WITHOUT CONTRAST TECHNIQUE: Multidetector CT  imaging of the cervical spine was performed without intravenous contrast. Multiplanar CT image reconstructions were also generated. RADIATION DOSE REDUCTION: This exam was performed according to the departmental dose-optimization program which includes automated exposure control, adjustment of the mA and/or kV according to patient size and/or use of iterative reconstruction technique. COMPARISON:  Cervical spine radiographs 11/22/2006. FINDINGS: Alignment: Minimal stepwise anterolisthesis at C3-4 and C4-5. No focal angulation. Skull base and vertebrae: No evidence of acute cervical spine fracture or traumatic subluxation. Soft tissues and spinal canal: No prevertebral fluid or swelling. No visible canal hematoma. Disc levels: Multilevel spondylosis with disc space narrowing and uncinate spurring most advanced at C5-6 and C6-7. Multilevel mild-to-moderate foraminal narrowing, greatest on the right at C5-6. Multilevel facet arthropathy. No large disc herniation or high-grade  central stenosis identified. Upper chest: Clear lung apices. Other: Mild carotid atherosclerosis. Mild thyroid  heterogeneity, similar to previous chest CTA 03/01/2023. Given the patient's age, no followup recommended unless clinically warranted.(Ref: J Am Coll Radiol. 2015 Feb;12(2): 143-50). IMPRESSION: 1. No evidence of acute cervical spine fracture, traumatic subluxation or static signs of instability. 2. Multilevel cervical spondylosis as described. Electronically Signed   By: Elmon Hagedorn M.D.   On: 06/02/2024 14:50    Review of Systems  HENT:  Negative for ear discharge, ear pain, hearing loss and tinnitus.   Eyes:  Negative for photophobia and pain.  Respiratory:  Negative for cough and shortness of breath.   Cardiovascular:  Negative for chest pain.  Gastrointestinal:  Negative for abdominal pain, nausea and vomiting.  Genitourinary:  Negative for dysuria, flank pain, frequency and urgency.  Musculoskeletal:  Positive for  arthralgias (Left hip). Negative for back pain, myalgias and neck pain.  Neurological:  Negative for dizziness and headaches.  Hematological:  Does not bruise/bleed easily.  Psychiatric/Behavioral:  The patient is not nervous/anxious.    Blood pressure (!) 177/79, pulse 65, temperature 98 F (36.7 C), temperature source Oral, resp. rate 20, SpO2 100%. Physical Exam Constitutional:      General: She is not in acute distress.    Appearance: She is well-developed. She is not diaphoretic.  HENT:     Head: Normocephalic and atraumatic.  Eyes:     General: No scleral icterus.       Right eye: No discharge.        Left eye: No discharge.     Conjunctiva/sclera: Conjunctivae normal.  Cardiovascular:     Rate and Rhythm: Normal rate and regular rhythm.  Pulmonary:     Effort: Pulmonary effort is normal. No respiratory distress.  Musculoskeletal:     Cervical back: Normal range of motion.     Comments: LLE No traumatic wounds, ecchymosis, or rash  Nontender, hip pain with hip AROM  No knee or ankle effusion  Knee stable to varus/ valgus and anterior/posterior stress  Sens DPN, SPN, TN intact  Motor EHL, ext, flex, evers 5/5  DP 2+, PT 2+, No significant edema  Skin:    General: Skin is warm and dry.  Neurological:     Mental Status: She is alert.  Psychiatric:        Mood and Affect: Mood normal.        Behavior: Behavior normal.     Assessment/Plan: Left hip fx -- Plan surgery tomorrow with Dr. Pryor Browning, type depending on CT results. Please keep NPO after MN.    Breanna Kin, PA-C Orthopedic Surgery [phone] 06/02/2024, 3:30 PM

## 2024-06-02 NOTE — ED Provider Notes (Signed)
 Fell - Hip fracture, Left hip frx, Needs admission pending blood work.  Physical Exam  BP (!) 177/79   Pulse 65   Temp 98 F (36.7 C) (Oral)   Resp 20   SpO2 100%   Physical Exam  Procedures  Procedures  ED Course / MDM    Medical Decision Making Amount and/or Complexity of Data Reviewed Labs: ordered. Radiology: ordered.  Risk OTC drugs. Decision regarding hospitalization.   Labs reviewed No acute findings. Admit to medicine       Tama Fails, PA-C 06/02/24 2116    Merdis Stalling, MD 06/02/24 2225

## 2024-06-02 NOTE — ED Triage Notes (Signed)
 BIB [facility] from home. Patient had a mechanical fall at home around 1230 this afternoon. Patient did hit her heard no LOC, not on thinners. Left sided deformity to pelvis noted by EMS.    54 110/60 94 RA 96 cbg

## 2024-06-02 NOTE — Telephone Encounter (Signed)
 FYI Only or Action Required?: FYI only for provider  Patient was last seen in primary care on 05/22/2024 by Plotnikov, Oakley Bellman, MD. Called Nurse Triage reporting Fall. Symptoms began today. Interventions attempted: Nothing. Symptoms are: stable.  Triage Disposition: See PCP When Office is Open (Within 3 Days)  Patient/caregiver understands and will follow disposition?: Yes  Copied from CRM [id number]. Topic: Clinical - Red Word Triage >> Jun 02, 2024 12:21 PM Alyse July wrote: Red Word that prompted transfer to Nurse Triage: Fall, Lump on her head and is currently in pain Reason for Disposition  MILD weakness (i.e., does not interfere with ability to work, go to school, normal activities)  (Exception: Mild weakness is a chronic symptom.)  Answer Assessment - Initial Assessment Questions 1. MECHANISM: "How did the fall happen?"     From knee pain-buckled, hand slipped from surface and she tripped.  2. DOMESTIC VIOLENCE AND ELDER ABUSE SCREENING: "Did you fall because someone pushed you or tried to hurt you?" If Yes, ask: "Are you safe now?"     no 3. ONSET: "When did the fall happen?" (e.g., minutes, hours, or days ago)     Today 4. LOCATION: "What part of the body hit the ground?" (e.g., back, buttocks, head, hips, knees, hands, head, stomach)     Hip, head, arm  5. INJURY: "Did you hurt (injure) yourself when you fell?" If Yes, ask: "What did you injure? Tell me more about this?" (e.g., body area; type of injury; pain severity)"     Hip bruised, head lump, arm tenderness 6. PAIN: "Is there any pain?" If Yes, ask: "How bad is the pain?" (e.g., Scale 1-10; or mild,  moderate, severe)   - NONE (0): No pain   - MILD (1-3): Doesn't interfere with normal activities    - MODERATE (4-7): Interferes with normal activities or awakens from sleep    - SEVERE (8-10): Excruciating pain, unable to do any normal activities      8/10 hip 7. SIZE: For cuts, bruises, or swelling, ask: "How large is it?"  (e.g., inches or centimeters)      Bruising to hip  8. OTHER SYMPTOMS: "Do you have any other symptoms?" (e.g., dizziness, fever, weakness; new onset or worsening).      None-Recalls entire incident 9. CAUSE: "What do you think caused the fall (or falling)?" (e.g., tripped, dizzy spell)       Knee buckled  Additional info: Already seen by home health nurse. Son states that Dr. Georgia Kipper told them in the past if she has a fall or other need for xray to call office to schedule to avoid ER unless emergent symptoms.  Protocols used: Falls and [facility]

## 2024-06-02 NOTE — Telephone Encounter (Signed)
 Copied from CRM [id number]. Topic: General - Other >> Jun 02, 2024  3:09 PM Breanna [name] wrote: Reason for CRM: Patients son called in to let the office know they did not mean to miss her appointment today. Patient had a fall and was taking by ambulance to the hospital.

## 2024-06-02 NOTE — Progress Notes (Signed)
 Orthopedic Tech Progress Note Patient Details:  Breanna [name] 1931-10-10 [id number]  Patient ID: Breanna [name], female   DOB: 01/13/1931, 89 y.o.   MRN: [id number] No OHF; pt exceeds age limit. Toi Foster 06/02/2024, 7:26 PM

## 2024-06-02 NOTE — Telephone Encounter (Signed)
 FYI Only or Action Required?: FYI only for provider  Patient was last seen in primary care on 05/22/2024 by Plotnikov, Oakley Bellman, MD. Called Nurse Triage reporting Fall. Symptoms began today. Interventions attempted: Nothing. Symptoms are: gradually worsening.  Triage Disposition: No disposition on file.  Patient/caregiver understands and will follow disposition?:  Copied from CRM [id number]. Topic: Clinical - Red Word Triage >> Jun 02, 2024 10:23 AM Marlan Silva wrote: Red Word that prompted transfer to Nurse Triage: Patients son states that the patient fell and has a swollen knee. Patients son Debborah Fairly states that her knee buckled this morning which was the cause of the fall and he states that she has been hurt for a while and Dr. Georgia Kipper wants to see the patient in person. Reason for Disposition  [1] MODERATE weakness (i.e., interferes with work, school, normal activities) AND [2] new-onset or worsening  Answer Assessment - Initial Assessment Questions 1. MECHANISM: "How did the fall happen?"     Was in the bathroom and reached for the sink and fell.  Left knee swollen  2. DOMESTIC VIOLENCE AND ELDER ABUSE SCREENING: "Did you fall because someone pushed you or tried to hurt you?" If Yes, ask: "Are you safe now?"     Denies  3. ONSET: "When did the fall happen?" (e.g., minutes, hours, or days ago)     This am 4. LOCATION: "What part of the body hit the ground?" (e.g., back, buttocks, head, hips, knees, hands, head, stomach)     Left knee 5. INJURY: "Did you hurt (injure) yourself when you fell?" If Yes, ask: "What did you injure? Tell me more about this?" (e.g., body area; type of injury; pain severity)"     Left knee 6. PAIN: "Is there any pain?" If Yes, ask: "How bad is the pain?" (e.g., Scale 1-10; or mild,  moderate, severe)   - NONE (0): No pain   - MILD (1-3): Doesn't interfere with normal activities    - MODERATE (4-7): Interferes with normal activities or awakens from sleep    - SEVERE  (8-10): Excruciating pain, unable to do any normal activities      8/10 7. SIZE: For cuts, bruises, or swelling, ask: "How large is it?" (e.g., inches or centimeters)      no 8. PREGNANCY: "Is there any chance you are pregnant?" "When was your last menstrual period?"     na 9. OTHER SYMPTOMS: "Do you have any other symptoms?" (e.g., dizziness, fever, weakness; new onset or worsening).      no 10. CAUSE: "What do you think caused the fall (or falling)?" (e.g., tripped, dizzy spell)       Lost balance.  Protocols used: Falls and [facility]

## 2024-06-02 NOTE — ED Notes (Signed)
 Resent purple and light green

## 2024-06-02 NOTE — Telephone Encounter (Signed)
 Copied from CRM [id number]. Topic: Clinical - Red Word Triage >> Jun 02, 2024 10:48 AM Magdalene School wrote: Red Word that prompted transfer to Nurse Triage: Patient had a fall 30 mins ago.

## 2024-06-02 NOTE — Progress Notes (Signed)
 Supported Pt that experienced fall.

## 2024-06-02 NOTE — Telephone Encounter (Signed)
 Copied from CRM [id number]. Topic: Clinical - Request for Lab/Test Order >> Jun 02, 2024 10:53 AM Magdalene School wrote: Reason for CRM: Patient was triaged at 10:30am today 06/02/24 and has an appointment on 06/04/24 but [facility] home nurse is requesting knee X-ray earlier or an earlier appointment.  Patient's son Emori Mumme would like a call back to move appointment up or when X-ray order is placed to get X-ray done today. Callback number: [phone]

## 2024-06-02 NOTE — ED Provider Notes (Signed)
 [place] EMERGENCY DEPARTMENT AT [facility] Provider Note   CSN: [id number] Arrival date & time: 06/02/24  1348     History HTN, DM  Chief Complaint  Patient presents with   Breanna Breanna [name] is a 89 y.o. female.  89 y.o female with a PM of DM,HTN presents to the ED via EMS s/p mechanical fall x PTA.  Patient reports she was reaching for her commode in order to use it when suddenly she slipped and fell landing on the left side of her body.  She did strike her head on the ground, reports a goose egg to the left side of her head, and loss of consciousness.  She is having pain along the left hip, has not ambulated since that incident.  She was found to be bradycardic with EMS with a heart rate in the 40s, she does have a obvious left side deformity noted.  Patient has not taken anything for pain control.  Denies any dizziness, chest pain, shortness of breath, blood thinners.  The history is provided by the patient.       Home Medications Prior to Admission medications   Medication Sig Start Date End Date Taking? Authorizing Provider  amLODipine  (NORVASC ) 10 MG tablet Take 1 tablet (10 mg total) by mouth daily. 04/23/24  Yes Clearnce Curia, NP  atropine  1 % ophthalmic solution Place 1 drop into both eyes daily. 01/16/21  Yes [provider]  calcium  carbonate (OSCAL) 1500 (600 Ca) MG TABS tablet Take 1,500 mg by mouth daily.   Yes [provider]  [facility] VITAMIN B12 1000 MCG tablet TAKE 1 TABLET (1,000 MCG TOTAL) BY MOUTH EVERY DAY 05/13/23  Yes Plotnikov, Aleksei V, MD  docusate sodium  (COLACE) 100 MG capsule TAKE 1 CAPSULE BY MOUTH TWICE A DAY 04/19/24  Yes Plotnikov, Aleksei V, MD  dorzolamide -timolol  (COSOPT ) 22.3-6.8 MG/ML ophthalmic solution Place 1 drop into both eyes 2 (two) times daily.    Yes [provider]  latanoprost  (XALATAN ) 0.005 % ophthalmic solution Place 1 drop into both eyes at bedtime.  04/26/16  Yes [provider]   losartan  (COZAAR ) 50 MG tablet TAKE 1 TABLET BY MOUTH EVERY DAY 04/27/24  Yes Plotnikov, Aleksei V, MD  metFORMIN  (GLUCOPHAGE ) 500 MG tablet Take 500 mg by mouth daily with breakfast.   Yes [provider]  ondansetron  (ZOFRAN ) 4 MG tablet Take 1 tablet (4 mg total) by mouth every 8 (eight) hours as needed for up to 14 days for nausea or vomiting. 06/04/24 06/18/24 Yes Albertus Alt, PA-C  pantoprazole  (PROTONIX ) 40 MG tablet TAKE 1 TABLET BY MOUTH EVERY DAY 11/14/23  Yes Plotnikov, Aleksei V, MD  rosuvastatin  (CRESTOR ) 5 MG tablet Take 5 mg by mouth daily. 11/11/23  Yes [provider]  sertraline  (ZOLOFT ) 100 MG tablet Take 1 tablet (100 mg total) by mouth at bedtime. 02/26/24  Yes Plotnikov, Oakley Bellman, MD  spironolactone  (ALDACTONE ) 25 MG tablet Take 25 mg by mouth 3 (three) times a week. 04/17/24  Yes [provider]  torsemide  (DEMADEX ) 20 MG tablet Take 20 mg by mouth daily.   Yes [provider]  acetaminophen  (TYLENOL ) 325 MG tablet Take 2 tablets (650 mg total) by mouth every 4 (four) hours. 06/06/24   Joette Mustard, MD  allopurinol  (ZYLOPRIM ) 100 MG tablet Take 0.5 tablets (50 mg total) by mouth every other day. 06/06/24   Joette Mustard, MD  artificial tears ophthalmic solution Place 1 drop  into both eyes as needed for dry eyes. 06/06/24   Joette Mustard, MD  aspirin  EC 81 MG tablet Take 1 tablet (81 mg total) by mouth 2 (two) times daily for 28 days. Swallow whole. 06/06/24 07/04/24  Joette Mustard, MD  carvedilol  (COREG ) 25 MG tablet Take 0.5 tablets (12.5 mg total) by mouth 2 (two) times daily with a meal. 06/06/24   Joette Mustard, MD  cholecalciferol  (CHOLECALCIFEROL ) 25 MCG tablet Take 1 tablet (1,000 Units total) by mouth daily. 06/07/24   Joette Mustard, MD  feeding supplement (ENSURE PLUS HIGH PROTEIN) LIQD Take 237 mLs by mouth 2 (two) times daily between meals. 06/06/24   Joette Mustard, MD  ferrous sulfate 325 (65 FE) MG tablet  Take 1 tablet (325 mg total) by mouth daily with breakfast. 06/07/24   Joette Mustard, MD  gabapentin  (NEURONTIN ) 100 MG capsule Take 1 capsule (100 mg total) by mouth at bedtime. 06/06/24   Joette Mustard, MD  glucose blood (GNP TRUE METRIX GLUCOSE STRIPS) test strip Use to check blood sugars twice a day 04/26/21   Plotnikov, Aleksei V, MD  ondansetron  (ZOFRAN -ODT) 4 MG disintegrating tablet Take 1 tablet (4 mg total) by mouth every 8 (eight) hours as needed for nausea or vomiting. 06/06/24   Joette Mustard, MD  oxyCODONE  (OXY IR/ROXICODONE ) 5 MG immediate release tablet Take 0.5 tablets (2.5 mg total) by mouth every 6 (six) hours as needed for up to 7 days for severe pain (pain score 7-10). 06/06/24 06/13/24  Joette Mustard, MD  polyethylene glycol (MIRALAX  / GLYCOLAX ) 17 g packet Take 17 g by mouth daily as needed for mild constipation. 06/06/24   Joette Mustard, MD  senna (SENOKOT) 8.6 MG TABS tablet Take 1 tablet (8.6 mg total) by mouth at bedtime. While taking opioids 06/06/24   Joette Mustard, MD      Allergies    Verapamil, Aspirin , Atenolol, Codeine, Codeine sulfate, Hydrochlorothiazide, Hydrocodone , Ibuprofen , Lisinopril, Onion, Shellfish allergy, Statins, Valsartan, Adhesive  [tape], and Other    Review of Systems   Review of Systems  Constitutional:  Negative for chills and fever.  HENT:  Negative for sore throat.   Respiratory:  Negative for shortness of breath.   Cardiovascular:  Negative for chest pain.  Gastrointestinal:  Negative for abdominal pain, diarrhea, nausea and vomiting.  Genitourinary:  Negative for flank pain.  Musculoskeletal:  Positive for arthralgias. Negative for back pain.  Neurological:  Negative for light-headedness and headaches.  All other systems reviewed and are negative.   Physical Exam Updated Vital Signs BP (!) 124/58 (BP Location: Left Arm)   Pulse (!) 55   Temp 98.2 F (36.8 C) (Oral)   Resp 20   Ht 5' 2 (1.575 m)   Wt 49.4 kg    SpO2 100%   BMI 19.94 kg/m  Physical Exam Vitals and nursing note reviewed.  Constitutional:      Appearance: Normal appearance.  HENT:     Head: Normocephalic.     Comments: Palpable goose egg to the left side of her head.    Nose: Nose normal.     Mouth/Throat:     Mouth: Mucous membranes are dry.   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   Cardiovascular:     Pulses:          Dorsalis pedis pulses are 2+ on the right side and 2+ on the left side.       Posterior tibial pulses are 2+ on  the right side and 2+ on the left side.  Pulmonary:     Effort: Pulmonary effort is normal.     Breath sounds: No wheezing.     Comments: No wheezing rhonchi or rales. Abdominal:     General: Abdomen is flat.     Tenderness: There is no abdominal tenderness.     Comments: Abdomen is soft, nontender to palpation.   Musculoskeletal:        General: Deformity present.     Cervical back: Normal range of motion and neck supple.     Left hip: Deformity, tenderness and bony tenderness present. Decreased range of motion.     Comments: Tenderness along the left femoral neck, exacerbated with any movement or palpation.   Skin:    General: Skin is warm and dry.   Neurological:     Mental Status: She is alert and oriented to person, place, and time.     ED Results / Procedures / Treatments   Labs (all labs ordered are listed, but only abnormal results are displayed) Labs Reviewed  CBC WITH DIFFERENTIAL/PLATELET - Abnormal; Notable for the following components:      Result Value   RBC 3.66 (*)    Hemoglobin 10.0 (*)    HCT 31.5 (*)    RDW 17.2 (*)    All other components within normal limits  COMPREHENSIVE METABOLIC PANEL WITH GFR - Abnormal; Notable for the following components:   Glucose, Bld 154 (*)    BUN 25 (*)    Creatinine, Ser 1.34 (*)    Albumin 3.0 (*)    GFR, Estimated 38 (*)    All other components within normal limits  CBC - Abnormal; Notable for the following  components:   RBC 3.36 (*)    Hemoglobin 9.0 (*)    HCT 28.2 (*)    RDW 16.7 (*)    Platelets 102 (*)    All other components within normal limits  BASIC METABOLIC PANEL WITH GFR - Abnormal; Notable for the following components:   CO2 21 (*)    BUN 24 (*)    Creatinine, Ser 1.42 (*)    Calcium  8.6 (*)    GFR, Estimated 35 (*)    All other components within normal limits  CBC - Abnormal; Notable for the following components:   RBC 2.99 (*)    Hemoglobin 8.1 (*)    HCT 25.0 (*)    RDW 16.9 (*)    Platelets 86 (*)    All other components within normal limits  BASIC METABOLIC PANEL WITH GFR - Abnormal; Notable for the following components:   Glucose, Bld 213 (*)    Creatinine, Ser 1.62 (*)    Calcium  7.9 (*)    GFR, Estimated 30 (*)    All other components within normal limits  HEMOGLOBIN A1C - Abnormal; Notable for the following components:   Hgb A1c MFr Bld 6.2 (*)    All other components within normal limits  BASIC METABOLIC PANEL WITH GFR - Abnormal; Notable for the following components:   Creatinine, Ser 1.57 (*)    Calcium  8.0 (*)    GFR, Estimated 31 (*)    All other components within normal limits  CBC - Abnormal; Notable for the following components:   RBC 2.89 (*)    Hemoglobin 7.7 (*)    HCT 23.9 (*)    RDW 16.6 (*)    Platelets 86 (*)    All other components within normal limits  IRON  AND TIBC - Abnormal; Notable for the following components:   Iron  9 (*)    TIBC 245 (*)    Saturation Ratios 4 (*)    All other components within normal limits  VITAMIN D  25 HYDROXY (VIT D DEFICIENCY, FRACTURES) - Abnormal; Notable for the following components:   Vit D, 25-Hydroxy 26.92 (*)    All other components within normal limits  GLUCOSE, CAPILLARY - Abnormal; Notable for the following components:   Glucose-Capillary 126 (*)    All other components within normal limits  GLUCOSE, CAPILLARY - Abnormal; Notable for the following components:   Glucose-Capillary 170 (*)     All other components within normal limits  BASIC METABOLIC PANEL WITH GFR - Abnormal; Notable for the following components:   Glucose, Bld 117 (*)    BUN 24 (*)    Creatinine, Ser 1.43 (*)    Calcium  8.4 (*)    GFR, Estimated 35 (*)    All other components within normal limits  CBC - Abnormal; Notable for the following components:   RBC 2.81 (*)    Hemoglobin 7.6 (*)    HCT 23.2 (*)    RDW 16.6 (*)    Platelets 103 (*)    All other components within normal limits  VITAMIN B12 - Abnormal; Notable for the following components:   Vitamin B-12 2,202 (*)    All other components within normal limits  GLUCOSE, CAPILLARY - Abnormal; Notable for the following components:   Glucose-Capillary 159 (*)    All other components within normal limits  GLUCOSE, CAPILLARY - Abnormal; Notable for the following components:   Glucose-Capillary 111 (*)    All other components within normal limits  GLUCOSE, CAPILLARY - Abnormal; Notable for the following components:   Glucose-Capillary 145 (*)    All other components within normal limits  I-STAT CHEM 8, ED - Abnormal; Notable for the following components:   Creatinine, Ser 1.30 (*)    Glucose, Bld 147 (*)    Calcium , Ion 1.08 (*)    TCO2 18 (*)    Hemoglobin 10.5 (*)    HCT 31.0 (*)    All other components within normal limits  MRSA NEXT GEN BY PCR, NASAL  GLUCOSE, CAPILLARY  GLUCOSE, CAPILLARY  FERRITIN  GLUCOSE, CAPILLARY  GLUCOSE, CAPILLARY  FOLATE  MAGNESIUM   PHOSPHORUS  GLUCOSE, CAPILLARY  TYPE AND SCREEN  TYPE AND SCREEN  PREPARE RBC (CROSSMATCH)  BLOOD TRANSFUSION REPORT - SCANNED   Narrative:    Ordered by an unspecified provider.    EKG EKG Interpretation Date/Time:  Tuesday June 02 2024 13:58:39 EDT Ventricular Rate:  159 PR Interval:    QRS Duration:  160 QT Interval:  422 QTC Calculation: 574 R Axis:   29  Text Interpretation: Baseline artifact precludes interpretation WIth limitations, rhythm appears sinus with  sinus arrhythmia Confirmed by Annita Kindle [phone]) on 06/02/2024 6:24:15 PM  Radiology No results found.  Procedures Procedures    Medications Ordered in ED Medications  0.9 %  sodium chloride  infusion (0 mLs Intravenous Stopping previously hung infusion 06/04/24 0733)  lactated ringers  infusion ( Intravenous New Bag/Given 06/04/24 2139)  dextrose  5 % and 0.45 % NaCl infusion ( Intravenous New Bag/Given 06/06/24 0829)  acetaminophen  (TYLENOL ) tablet 650 mg (650 mg Oral Given 06/02/24 1605)  ceFAZolin  (ANCEF ) IVPB 2g/100 mL premix (2 g Intravenous Given 06/03/24 1500)  tranexamic acid  (CYKLOKAPRON ) IVPB 1,000 mg (1,000 mg Intravenous Given 06/03/24 1520)  feeding supplement (ENSURE PRE-SURGERY) liquid 296  mL (296 mLs Oral Given 06/03/24 0004)  chlorhexidine  (PERIDEX ) 0.12 % solution 15 mL (15 mLs Mouth/Throat Given 06/03/24 1415)    Or  Oral care mouth rinse ( Mouth Rinse See Alternative 06/03/24 1415)  ceFAZolin  (ANCEF ) IVPB 2g/100 mL premix (0 g Intravenous Stopping previously hung infusion 06/04/24 0733)  iron  sucrose (VENOFER ) 500 mg in sodium chloride  0.9 % 250 mL IVPB (500 mg Intravenous New Bag/Given 06/05/24 1327)  lactulose (CHRONULAC) 10 GM/15ML solution 20 g (20 g Oral Given 06/05/24 1515)  0.9 %  sodium chloride  infusion (Manually program via Guardrails IV Fluids) ( Intravenous New Bag/Given 06/06/24 1330)    ED Course/ Medical Decision Making/ A&P                                 Medical Decision Making Amount and/or Complexity of Data Reviewed Labs: ordered. Radiology: ordered.  Risk OTC drugs. Decision regarding hospitalization.     This patient presents to the ED for concern of fall, this involves a number of treatment options, and is a complaint that carries with it a high risk of complications and morbidity.    Co morbidities: Discussed in HPI   Brief History:  See HPI.   EMR reviewed including pt PMHx, past surgical history and past visits to ER.   See HPI  for more details   Lab Tests:  I ordered and independently interpreted labs.  The pertinent results include:    I personally reviewed all laboratory work and imaging. Metabolic panel without any acute abnormality specifically kidney function within normal limits and no significant electrolyte abnormalities. CBC without leukocytosis or significant anemia.   Imaging Studies:  Pending CT Head/ Cervical pending along with multiple xrays of her left hip, knee and wrist.   Medicines ordered:  I ordered medication including tylenol   for pain control Reevaluation of the patient after these medicines showed that the patient stayed the same I have reviewed the patients home medicines and have made adjustments as needed  Consults:  Orthopedic surgery   Reevaluation:  After the interventions noted above I re-evaluated patient and found that they have :stayed the same   Social Determinants of Health:  The patient's social determinants of health were a factor in the care of this patient  Problem List / ED Course:  Patient coming from home status post mechanical fall, reports was trying to return to her, when suddenly she lost her balance and landed on her left hip, has not walked since the incident although she does walk with a walker at baseline.  She reports striking her head with some loss of consciousness, has a palpable goose egg to the left temple.  Vitals are remarkable for elevated pressure, heart rate found to be bradycardic on scene, however has been 60 or above while in the ED.  Does have pain with palpation of the left femoral neck.  Denies any blood thinner use. I spoke to the son at the bedside Harlee Lichtenstein, who reports he witnessed the fall, he reports she was not able to put any pressure on her left leg after the fall, she did not receive any medication for pain.  Her last oral intake was this morning with breakfast.  He is also concerned as she was having swelling to her left  knee, they thought it was likely atmospheric changes , has not had any improvement since then.   Dispostion:  After consideration of  the diagnostic results and the patients response to treatment, I feel that the patent would benefit from admission for hip fx repair.    Portions of this note were generated with Scientist, clinical (histocompatibility and immunogenetics). Dictation errors may occur despite best attempts at proofreading.   Final Clinical Impression(s) / ED Diagnoses Final diagnoses:  Closed fracture of left hip, initial encounter [facility])    Rx / DC Orders ED Discharge Orders          Ordered    ferrous sulfate 325 (65 FE) MG tablet  Daily with breakfast        06/06/24 1246    cholecalciferol  (CHOLECALCIFEROL ) 25 MCG tablet  Daily        06/06/24 1246    acetaminophen  (TYLENOL ) 325 MG tablet  Every 4 hours        06/06/24 1246    aspirin  EC 81 MG tablet  2 times daily        06/06/24 1246    oxyCODONE  (OXY IR/ROXICODONE ) 5 MG immediate release tablet  Every 6 hours PRN        06/06/24 1246    carvedilol  (COREG ) 25 MG tablet  2 times daily with meals        06/06/24 1246    polyethylene glycol (MIRALAX  / GLYCOLAX ) 17 g packet  Daily PRN        06/06/24 1246    senna (SENOKOT) 8.6 MG TABS tablet  Daily at bedtime        06/06/24 1246    ondansetron  (ZOFRAN -ODT) 4 MG disintegrating tablet  Every 8 hours PRN        06/06/24 1246    gabapentin  (NEURONTIN ) 100 MG capsule  Daily at bedtime        06/06/24 1246    feeding supplement (ENSURE PLUS HIGH PROTEIN) LIQD  2 times daily between meals        06/06/24 1246    artificial tears ophthalmic solution  As needed        06/06/24 1246    allopurinol  (ZYLOPRIM ) 100 MG tablet  Every other day        06/06/24 1254    Diet general        06/06/24 1317    Call MD for:  temperature >100.4        06/06/24 1317    Call MD for:  redness, tenderness, or signs of infection (pain, swelling, redness, odor or green/yellow discharge around incision site)         06/06/24 1317    aspirin  EC 81 MG tablet  2 times daily,   Status:  Discontinued        06/04/24 0659    traMADol  (ULTRAM ) 50 MG tablet  Every 6 hours PRN,   Status:  Discontinued        06/04/24 0659    ondansetron  (ZOFRAN ) 4 MG tablet  Every 8 hours PRN        06/04/24 0659              Breanna Ayotte, PA-C 06/11/24 2343

## 2024-06-03 ENCOUNTER — Inpatient Hospital Stay (HOSPITAL_COMMUNITY)

## 2024-06-03 ENCOUNTER — Encounter (HOSPITAL_COMMUNITY): Payer: Self-pay | Admitting: Internal Medicine

## 2024-06-03 ENCOUNTER — Other Ambulatory Visit: Payer: Self-pay

## 2024-06-03 ENCOUNTER — Encounter (HOSPITAL_COMMUNITY)
Admission: EM | Disposition: A | Discharge status: Skilled Nursing Facility | Payer: Self-pay | Source: Home / Self Care | Attending: Student

## 2024-06-03 DIAGNOSIS — S72012D Unspecified intracapsular fracture of left femur, subsequent encounter for closed fracture with routine healing: Secondary | ICD-10-CM | POA: Diagnosis not present

## 2024-06-03 DIAGNOSIS — S72002A Fracture of unspecified part of neck of left femur, initial encounter for closed fracture: Secondary | ICD-10-CM | POA: Diagnosis not present

## 2024-06-03 DIAGNOSIS — N1832 Chronic kidney disease, stage 3b: Secondary | ICD-10-CM

## 2024-06-03 DIAGNOSIS — E1122 Type 2 diabetes mellitus with diabetic chronic kidney disease: Secondary | ICD-10-CM

## 2024-06-03 DIAGNOSIS — I129 Hypertensive chronic kidney disease with stage 1 through stage 4 chronic kidney disease, or unspecified chronic kidney disease: Secondary | ICD-10-CM | POA: Diagnosis not present

## 2024-06-03 LAB — GLUCOSE, CAPILLARY
Glucose-Capillary: 87 mg/dL (ref 70–99)
Glucose-Capillary: 86 mg/dL (ref 70–99)

## 2024-06-03 LAB — BASIC METABOLIC PANEL WITH GFR
Anion gap: 9 (ref 5–15)
BUN: 24 mg/dL — ABNORMAL HIGH (ref 8–23)
CO2: 21 mmol/L — ABNORMAL LOW (ref 22–32)
Calcium: 8.6 mg/dL — ABNORMAL LOW (ref 8.9–10.3)
Chloride: 107 mmol/L (ref 98–111)
Creatinine, Ser: 1.42 mg/dL — ABNORMAL HIGH (ref 0.44–1.00)
GFR, Estimated: 35 mL/min — ABNORMAL LOW (ref >60)
Glucose, Bld: 88 mg/dL (ref 70–99)
Potassium: 3.8 mmol/L (ref 3.5–5.1)
Sodium: 137 mmol/L (ref 135–145)

## 2024-06-03 LAB — CBC
HCT: 28.2 % — ABNORMAL LOW (ref 36.0–46.0)
Hemoglobin: 9 g/dL — ABNORMAL LOW (ref 12.0–15.0)
MCH: 26.8 pg (ref 26.0–34.0)
MCHC: 31.9 g/dL (ref 30.0–36.0)
MCV: 83.9 fL (ref 80.0–100.0)
Platelets: 102 10*3/uL — ABNORMAL LOW (ref 150–400)
RBC: 3.36 MIL/uL — ABNORMAL LOW (ref 3.87–5.11)
RDW: 16.7 % — ABNORMAL HIGH (ref 11.5–15.5)
WBC: 6.6 10*3/uL (ref 4.0–10.5)
nRBC: 0 % (ref 0.0–0.2)

## 2024-06-03 LAB — MRSA NEXT GEN BY PCR, NASAL: MRSA by PCR Next Gen: NOT DETECTED

## 2024-06-03 SURGERY — HEMIARTHROPLASTY (BIPOLAR) HIP, POSTERIOR APPROACH FOR FRACTURE
Anesthesia: Spinal | Site: Hip | Laterality: Left

## 2024-06-03 MED ORDER — ONDANSETRON HCL 4 MG/2ML IJ SOLN
INTRAMUSCULAR | Status: DC | PRN
  Administered 2024-06-03: 4 mg via INTRAVENOUS

## 2024-06-03 MED ORDER — CEFAZOLIN SODIUM-DEXTROSE 2-4 GM/100ML-% IV SOLN
2.0000 g | Freq: Two times a day (BID) | INTRAVENOUS | Status: AC
  Administered 2024-06-03: 2 g via INTRAVENOUS
  Filled 2024-06-03: qty 100

## 2024-06-03 MED ORDER — 0.9 % SODIUM CHLORIDE (POUR BTL) OPTIME
TOPICAL | Status: DC | PRN
  Administered 2024-06-03: 1000 mL

## 2024-06-03 MED ORDER — SODIUM CHLORIDE (PF) 0.9 % IJ SOLN
INTRAMUSCULAR | Status: AC
  Filled 2024-06-03: qty 20

## 2024-06-03 MED ORDER — BUPIVACAINE IN DEXTROSE 0.75-8.25 % IT SOLN
INTRATHECAL | Status: DC | PRN
  Administered 2024-06-03: 1.6 mL via INTRATHECAL

## 2024-06-03 MED ORDER — BUPIVACAINE LIPOSOME 1.3 % IJ SUSP
INTRAMUSCULAR | Status: AC
  Filled 2024-06-03: qty 20

## 2024-06-03 MED ORDER — TRANEXAMIC ACID-NACL 1000-0.7 MG/100ML-% IV SOLN
1000.0000 mg | INTRAVENOUS | Status: AC
  Administered 2024-06-03: 1000 mg via INTRAVENOUS
  Filled 2024-06-03: qty 100

## 2024-06-03 MED ORDER — ONDANSETRON HCL 4 MG/2ML IJ SOLN
INTRAMUSCULAR | Status: AC
Start: 2024-06-03 — End: 2024-06-03
  Filled 2024-06-03: qty 2

## 2024-06-03 MED ORDER — SODIUM CHLORIDE 0.9 % IR SOLN
Status: DC | PRN
  Administered 2024-06-03: 3000 mL

## 2024-06-03 MED ORDER — PHENYLEPHRINE 80 MCG/ML (10ML) SYRINGE FOR IV PUSH (FOR BLOOD PRESSURE SUPPORT)
PREFILLED_SYRINGE | INTRAVENOUS | Status: DC | PRN
  Administered 2024-06-03: 80 ug via INTRAVENOUS

## 2024-06-03 MED ORDER — PHENYLEPHRINE 80 MCG/ML (10ML) SYRINGE FOR IV PUSH (FOR BLOOD PRESSURE SUPPORT)
PREFILLED_SYRINGE | INTRAVENOUS | Status: AC
  Filled 2024-06-03: qty 10

## 2024-06-03 MED ORDER — CHLORHEXIDINE GLUCONATE 0.12 % MT SOLN: 15.0000 mL | Freq: Once | OROMUCOSAL | Status: AC

## 2024-06-03 MED ORDER — POVIDONE-IODINE 10 % EX SWAB: 2.0000 | Freq: Once | CUTANEOUS | Status: DC

## 2024-06-03 MED ORDER — BUPIVACAINE-EPINEPHRINE 0.25% -1:200000 IJ SOLN
INTRAMUSCULAR | Status: DC | PRN
  Administered 2024-06-03: 50 mL via SURGICAL_CAVITY

## 2024-06-03 MED ORDER — LIDOCAINE 2% (20 MG/ML) 5 ML SYRINGE
INTRAMUSCULAR | Status: AC
Start: 2024-06-03 — End: 2024-06-03
  Filled 2024-06-03: qty 5

## 2024-06-03 MED ORDER — CHLORHEXIDINE GLUCONATE 4 % EX SOLN: 60.0000 mL | Freq: Once | CUTANEOUS | Status: DC

## 2024-06-03 MED ORDER — ROCURONIUM BROMIDE 10 MG/ML (PF) SYRINGE
PREFILLED_SYRINGE | INTRAVENOUS | Status: AC
  Filled 2024-06-03: qty 10

## 2024-06-03 MED ORDER — CHLORHEXIDINE GLUCONATE 0.12 % MT SOLN
OROMUCOSAL | Status: AC
  Administered 2024-06-03: 15 mL via OROMUCOSAL
  Filled 2024-06-03: qty 15

## 2024-06-03 MED ORDER — PROPOFOL 500 MG/50ML IV EMUL
INTRAVENOUS | Status: DC | PRN
  Administered 2024-06-03: 75 ug/kg/min via INTRAVENOUS

## 2024-06-03 MED ORDER — ONDANSETRON HCL 4 MG/2ML IJ SOLN
4.0000 mg | Freq: Four times a day (QID) | INTRAMUSCULAR | Status: DC
  Administered 2024-06-03: 4 mg via INTRAVENOUS
  Filled 2024-06-03: qty 2

## 2024-06-03 MED ORDER — BUPIVACAINE-EPINEPHRINE (PF) 0.25% -1:200000 IJ SOLN
INTRAMUSCULAR | Status: AC
  Filled 2024-06-03: qty 30

## 2024-06-03 MED ORDER — PROPOFOL 10 MG/ML IV BOLUS
INTRAVENOUS | Status: DC | PRN
  Administered 2024-06-03: 20 mg via INTRAVENOUS
  Administered 2024-06-03 (×2): 30 mg via INTRAVENOUS

## 2024-06-03 MED ORDER — ORAL CARE MOUTH RINSE: 15.0000 mL | Freq: Once | OROMUCOSAL | Status: AC

## 2024-06-03 MED ORDER — CEFAZOLIN SODIUM-DEXTROSE 2-4 GM/100ML-% IV SOLN
2.0000 g | INTRAVENOUS | Status: AC
  Administered 2024-06-03: 2 g via INTRAVENOUS
  Filled 2024-06-03: qty 100

## 2024-06-03 MED ORDER — ACETAMINOPHEN 10 MG/ML IV SOLN
INTRAVENOUS | Status: DC | PRN
Start: 2024-06-03 — End: 2024-06-03
  Administered 2024-06-03: 1000 mg via INTRAVENOUS

## 2024-06-03 MED ORDER — ACETAMINOPHEN 325 MG PO TABS
650.0000 mg | ORAL_TABLET | ORAL | Status: DC
  Administered 2024-06-03 – 2024-06-06 (×14): 650 mg via ORAL
  Filled 2024-06-03 (×16): qty 2

## 2024-06-03 MED ORDER — ONDANSETRON 4 MG PO TBDP: 4.0000 mg | ORAL_TABLET | Freq: Three times a day (TID) | ORAL | Status: DC | PRN

## 2024-06-03 MED ORDER — ASPIRIN 81 MG PO TBEC
81.0000 mg | DELAYED_RELEASE_TABLET | Freq: Two times a day (BID) | ORAL | Status: DC
  Administered 2024-06-04 – 2024-06-06 (×5): 81 mg via ORAL
  Filled 2024-06-03 (×5): qty 1

## 2024-06-03 MED ORDER — LACTATED RINGERS IV SOLN: INTRAVENOUS | Status: DC

## 2024-06-03 MED ORDER — EPHEDRINE 5 MG/ML INJ
INTRAVENOUS | Status: AC
  Filled 2024-06-03: qty 5

## 2024-06-03 MED ORDER — PROPOFOL 10 MG/ML IV BOLUS
INTRAVENOUS | Status: AC
Start: 2024-06-03 — End: 2024-06-03
  Filled 2024-06-03: qty 20

## 2024-06-03 SURGICAL SUPPLY — 57 items
BAG COUNTER SPONGE SURGICOUNT (BAG) IMPLANT
BLADE SAW SGTL 73X25 THK (BLADE) ×1 IMPLANT
BLADE SURG SZ10 CARB STEEL (BLADE) ×2 IMPLANT
BRUSH FEMORAL CANAL (MISCELLANEOUS) IMPLANT
CEMENT BONE SIMPLEX SPEEDSET (Cement) IMPLANT
CEMENT RESTRICTOR BONE PREP ST (Cement) IMPLANT
CHLORAPREP W/TINT 26 (MISCELLANEOUS) ×1 IMPLANT
COVER SURGICAL LIGHT HANDLE (MISCELLANEOUS) ×1 IMPLANT
DERMABOND ADVANCED .7 DNX12 (GAUZE/BANDAGES/DRESSINGS) IMPLANT
DRAPE IMP U-DRAPE 54X76 (DRAPES) ×1 IMPLANT
DRAPE INCISE IOBAN 66X45 STRL (DRAPES) ×1 IMPLANT
DRAPE SURG ORHT 6 SPLT 77X108 (DRAPES) ×2 IMPLANT
DRAPE U-SHAPE 47X51 STRL (DRAPES) ×1 IMPLANT
DRSG AQUACEL AG ADV 3.5X10 (GAUZE/BANDAGES/DRESSINGS) IMPLANT
DRSG MEPILEX POST OP 4X8 (GAUZE/BANDAGES/DRESSINGS) ×1 IMPLANT
ELECT BLADE TIP CTD 4 INCH (ELECTRODE) ×1 IMPLANT
ELECT REM PT RETURN 15FT ADLT (MISCELLANEOUS) ×1 IMPLANT
EVACUATOR 1/8 PVC DRAIN (DRAIN) IMPLANT
FACESHIELD WRAPAROUND (MASK) IMPLANT
FACESHIELD WRAPAROUND OR TEAM (MASK) ×4 IMPLANT
FEMORAL HEAD LFIT V40 26M 3M (Head) IMPLANT
GLOVE BIO SURGEON STRL SZ7.5 (GLOVE) ×2 IMPLANT
GLOVE BIOGEL PI IND STRL 7.5 (GLOVE) ×1 IMPLANT
GLOVE BIOGEL PI IND STRL 8 (GLOVE) ×1 IMPLANT
GLOVE SURG SYN 7.5 E (GLOVE) ×1 IMPLANT
GLOVE SURG SYN 7.5 PF PI (GLOVE) ×1 IMPLANT
GOWN STRL REUS W/ TWL LRG LVL3 (GOWN DISPOSABLE) ×1 IMPLANT
GOWN STRL REUS W/ TWL XL LVL3 (GOWN DISPOSABLE) ×1 IMPLANT
HEAD LOCK BP UHR HIP 26X43 (Orthopedic Implant) IMPLANT
KIT BASIN OR (CUSTOM PROCEDURE TRAY) ×1 IMPLANT
KIT TURNOVER KIT B (KITS) IMPLANT
MANIFOLD NEPTUNE II (INSTRUMENTS) ×1 IMPLANT
NS IRRIG 1000ML POUR BTL (IV SOLUTION) ×1 IMPLANT
PACK TOTAL JOINT (CUSTOM PROCEDURE TRAY) ×1 IMPLANT
PENCIL SMOKE EVACUATOR (MISCELLANEOUS) IMPLANT
PRESSURIZER FEMORAL UNIV (MISCELLANEOUS) IMPLANT
RETRIEVER SUT HEWSON (MISCELLANEOUS) ×1 IMPLANT
SET HNDPC FAN SPRY TIP SCT (DISPOSABLE) IMPLANT
STAPLER SKIN PROX 35W (STAPLE) ×1 IMPLANT
STEM HIP ACCOLADE 5X37 132D (Stem) IMPLANT
STRIP CLOSURE SKIN 1/4X4 (GAUZE/BANDAGES/DRESSINGS) ×1 IMPLANT
SUCTION TUBE FRAZIER 12FR DISP (SUCTIONS) ×1 IMPLANT
SUT ETHIBOND 2 V 37 (SUTURE) IMPLANT
SUT MNCRL AB 3-0 PS2 27 (SUTURE) IMPLANT
SUT MNCRL AB 4-0 PS2 18 (SUTURE) ×1 IMPLANT
SUT STRATAFIX 1PDS 45CM VIOLET (SUTURE) IMPLANT
SUT VIC AB 0 CT1 36 (SUTURE) ×2 IMPLANT
SUT VIC AB 1 CTX36XBRD ANBCTR (SUTURE) ×2 IMPLANT
SUT VIC AB 2-0 CT1 TAPERPNT 27 (SUTURE) ×2 IMPLANT
SUTURE STRATFX 0 PDS 27 VIOLET (SUTURE) ×1 IMPLANT
SYR 50ML LL SCALE MARK (SYRINGE) ×1 IMPLANT
TOWEL OR 17X26 10 PK STRL BLUE (TOWEL DISPOSABLE) ×1 IMPLANT
TOWEL OR NON WOVEN STRL DISP B (DISPOSABLE) ×1 IMPLANT
TOWER CARTRIDGE SMART MIX (DISPOSABLE) IMPLANT
TRAY CATH INTERMITTENT SS 16FR (CATHETERS) IMPLANT
TRAY FOLEY MTR SLVR 16FR STAT (SET/KITS/TRAYS/PACK) ×1 IMPLANT
WATER STERILE IRR 1000ML POUR (IV SOLUTION) ×2 IMPLANT

## 2024-06-03 NOTE — Transfer of Care (Signed)
 Immediate Anesthesia Transfer of Care Note  Patient: Breanna [name]  Procedure(s) Performed: HEMIARTHROPLASTY (BIPOLAR) HIP, POSTERIOR APPROACH FOR FRACTURE (Left: Hip)  Patient Location: PACU  Anesthesia Type:MAC and Spinal  Level of Consciousness: awake and alert   Airway & Oxygen Therapy: Patient Spontanous Breathing  Post-op Assessment: Report given to RN and Post -op Vital signs reviewed and stable  Post vital signs: Reviewed and stable  Last Vitals:  Vitals Value Taken Time  BP 137/60 06/03/24 1642  Temp    Pulse 42 06/03/24 1644  Resp 24 06/03/24 1644  SpO2 99 % 06/03/24 1644  Vitals shown include unfiled device data.  Last Pain:  Vitals:   06/03/24 1413  TempSrc:   PainSc: 2       Patients Stated Pain Goal: 3 (06/03/24 1413)  Complications: No notable events documented.

## 2024-06-03 NOTE — Progress Notes (Signed)
 Transition of Care [facility]) - CAGE-AID Screening   Patient Details  Name: Breanna [name] MRN: [id number] Date of Birth: 10-29-1931  Transition of Care [facility]) CM/SW Contact:    Brunetta Capes, RN Phone Number: 06/03/2024, 9:02 PM   Clinical Narrative:  Patient denies use of alcohol  and illicit substances. Resources not given at this time.  CAGE-AID Screening:    Have You Ever Felt You Ought to Cut Down on Your Drinking or Drug Use?: No Have People Annoyed You By Critizing Your Drinking Or Drug Use?: No Have You Felt Bad Or Guilty About Your Drinking Or Drug Use?: No Have You Ever Had a Drink or Used Drugs First Thing In The Morning to Steady Your Nerves or to Get Rid of a Hangover?: No CAGE-AID Score: 0  Substance Abuse Education Offered: No

## 2024-06-03 NOTE — Anesthesia Preprocedure Evaluation (Addendum)
 Anesthesia Evaluation  Patient identified by MRN, date of birth, ID band Patient awake    Reviewed: Allergy & Precautions, NPO status , Patient's Chart, lab work & pertinent test results, reviewed documented beta blocker date and time   Airway Mallampati: II  TM Distance: >3 FB Neck ROM: Full    Dental  (+) Chipped, Dental Advisory Given,    Pulmonary shortness of breath and with exertion   Pulmonary exam normal breath sounds clear to auscultation       Cardiovascular hypertension, Pt. on medications and Pt. on home beta blockers Normal cardiovascular exam Rhythm:Regular Rate:Normal  Echo 01/18/24  1. Left ventricular ejection fraction, by estimation, is 60 to 65%. The  left ventricle has normal function. The left ventricle has no regional  wall motion abnormalities. There is moderate left ventricular hypertrophy.  Left ventricular diastolic  parameters are consistent with Grade II diastolic dysfunction  (pseudonormalization). Elevated left atrial pressure. The average left  ventricular global longitudinal strain is -20.3 %.   2. Right ventricular systolic function is normal. The right ventricular  size is normal. There is moderately elevated pulmonary artery systolic  pressure. The estimated right ventricular systolic pressure is 47.4 mmHg.   3. Left atrial size was severely dilated.   4. The mitral valve is degenerative. Moderate mitral valve regurgitation.   5. Tricuspid valve regurgitation is mild to moderate.   6. The aortic valve is tricuspid. Aortic valve regurgitation is trivial.  Aortic valve sclerosis is present, with no evidence of aortic valve  stenosis.   7. The inferior vena cava is normal in size with greater than 50%  respiratory variability, suggesting right atrial pressure of 3 mmHg.      Neuro/Psych  Headaches PSYCHIATRIC DISORDERS  Depression    CVA, No Residual Symptoms    GI/Hepatic Neg liver ROS,GERD   Medicated,,  Endo/Other  diabetes, Well Controlled, Type 2, Oral Hypoglycemic Agents    Renal/GU Renal InsufficiencyRenal disease  negative genitourinary   Musculoskeletal  (+) Arthritis , Osteoarthritis,  Left intertrochanteric hip Fx   Abdominal   Peds  Hematology  (+) Blood dyscrasia, anemia Thrombocytopenia- Plt 102k   Anesthesia Other Findings   Reproductive/Obstetrics                              Anesthesia Physical Anesthesia Plan  ASA: 3  Anesthesia Plan: Spinal   Post-op Pain Management: Minimal or no pain anticipated and Dilaudid IV   Induction: Intravenous  PONV Risk Score and Plan: 3 and Treatment may vary due to age or medical condition and Propofol  infusion  Airway Management Planned: Natural Airway and Simple Face Mask  Additional Equipment:   Intra-op Plan:   Post-operative Plan:   Informed Consent: I have reviewed the patients History and Physical, chart, labs and discussed the procedure including the risks, benefits and alternatives for the proposed anesthesia with the patient or authorized representative who has indicated his/her understanding and acceptance.     Dental advisory given  Plan Discussed with: CRNA and Anesthesiologist  Anesthesia Plan Comments:         Anesthesia Quick Evaluation

## 2024-06-03 NOTE — Anesthesia Procedure Notes (Addendum)
 Spinal  Patient location during procedure: OR Start time: 06/03/2024 2:47 PM Reason for block: surgical anesthesia Staffing Performed by: Tura Gaines, MD Authorized by: Tura Gaines, MD   Preanesthetic Checklist Completed: patient identified, IV checked, site marked, risks and benefits discussed, surgical consent, monitors and equipment checked, pre-op evaluation and timeout performed Spinal Block Patient position: left lateral decubitus Prep: DuraPrep and site prepped and draped Patient monitoring: heart rate, cardiac monitor, continuous pulse ox and blood pressure Approach: midline Location: L3-4 Injection technique: single-shot Needle Needle type: Pencan  Needle gauge: 24 G Needle length: 9 cm Needle insertion depth: 6 cm Assessment Sensory level: T4 Events: CSF return Additional Notes Patient tolerated procedure well. Adequate sensory level. Attempt x 4. Technically difficult due to previous back surgery and bed position.

## 2024-06-03 NOTE — Plan of Care (Signed)
   Problem: Education: Goal: Knowledge of General Education information will improve Description Including pain rating scale, medication(s)/side effects and non-pharmacologic comfort measures Outcome: Progressing   Problem: Health Behavior/Discharge Planning: Goal: Ability to manage health-related needs will improve Outcome: Progressing

## 2024-06-03 NOTE — Anesthesia Postprocedure Evaluation (Signed)
 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) Performed: HEMIARTHROPLASTY (BIPOLAR) HIP, POSTERIOR APPROACH FOR FRACTURE (Left: Hip)     Patient location during evaluation: PACU Anesthesia Type: Spinal Level of consciousness: oriented and awake and alert Pain management: pain level controlled Vital Signs Assessment: post-procedure vital signs reviewed and stable Respiratory status: spontaneous breathing, respiratory function stable and nonlabored ventilation Cardiovascular status: blood pressure returned to baseline and stable Postop Assessment: no headache, no backache, no apparent nausea or vomiting and spinal receding Anesthetic complications: no   No notable events documented.  Last Vitals:  Vitals:   06/03/24 1715 06/03/24 1720  BP: (!) 161/64   Pulse: (!) 45 (!) 46  Resp: (!) 21 (!) 22  Temp: 36.6 C   SpO2: 96% 100%    Last Pain:  Vitals:   06/03/24 1720  TempSrc:   PainSc: 0-No pain                 Geffrey Michaelsen A.

## 2024-06-03 NOTE — Op Note (Signed)
 06/02/2024 - 06/03/2024  4:12 PM  PATIENT:  Breanna [name]   MRN: [id number]  PRE-OPERATIVE DIAGNOSIS:  Left femoral neck fracture  POST-OPERATIVE DIAGNOSIS:  Left femoral neck fracture  PROCEDURE:  Procedure(s): Left cemented hip hemiarthroplasty   PREOPERATIVE INDICATIONS:  Breanna [name] is an 89 y.o. female who was admitted 06/02/2024 with a diagnosis of Closed subcapital fracture of neck of femur, left, initial encounter [facility]) and elected for surgical management.  The risks benefits and alternatives were discussed with the patient including but not limited to the risks of nonoperative treatment, versus surgical intervention including infection, bleeding, nerve injury, periprosthetic fracture, the need for revision surgery, dislocation, leg length discrepancy, blood clots, cardiopulmonary complications, morbidity, mortality, among others, and they were willing to proceed.  Predicted outcome is good, although there will be at least a six to nine month expected recovery.   OPERATIVE REPORT     SURGEON:  Priscille Brought, MD    ASSISTANT: Mason Sole, PA-C (Present throughout the entire procedure,  necessary for completion of procedure in a timely manner, assisting with retraction, instrumentation, and closure)     ANESTHESIA:  spinal  ESTIMATED BLOOD LOSS: 150cc    COMPLICATIONS:  None.      COMPONENTS:  Accolade C size 5 with 132 degree neck angle, 43 mm bipolar Hemi head ball 26 mm inner diameter, 26-3 mm cobalt chrome femoral head ball V40 taper Implant Name Type Inv. Item Serial No. Manufacturer Lot No. LRB No. Used Action  CEMENT RESTRICTOR BONE PREP ST - [id number] Cement CEMENT RESTRICTOR BONE PREP ST  [employer] [id number] Left 1 Implanted  CEMENT BONE SIMPLEX SPEEDSET - [id number] Cement CEMENT BONE SIMPLEX SPEEDSET  [employer] [id number] Left 2 Implanted  STEM HIP ACCOLADE 5X37 132D - [id number] Stem STEM HIP ACCOLADE 5X37 132D  [employer]  [id number] Left 1 Implanted  HEAD LOCK BP UHR HIP 26X43 - [id number] Orthopedic Implant HEAD LOCK BP UHR HIP 26X43  [employer] [id number] Left 1 Implanted  STRYKER LFIT V40 FEMORAL HEAD    [employer] [id number] Left 1 Implanted      PROCEDURE IN DETAIL: The patient was met in the holding area and identified.  The appropriate hip  was marked at the operative site. The patient was then transported to the OR and  placed under anesthesia.  At that point, the patient was  placed in the lateral decubitus position with the operative side up and  secured to the operating room table and all bony prominences padded. A subaxillary role was placed.    The operative lower extremity was prepped from the iliac crest to the ankle.  Sterile draping was performed.  2g of ancef2 and 1g TXA were given prior to incision. Time out was performed prior to incision.      A routine posterolateral approach was utilized via sharp dissection  carried down to the subcutaneous tissue.  Gross bleeders were Bovie  coagulated.  The iliotibial band was identified and incised  along the length of the skin incision.  A Charnley retractor was inserted with care to protect the sciatic nerve.  With the hip internally rotated, the short external rotators  were identified. The piriformis was tagged with #2 Ethibond, and the hip capsule released in a T-type fashion, and posterior sleeve of the capsule was also tagged.  The femoral neck was exposed, and I resected the femoral neck using the appropriate jig. This was performed at approximately a thumb's breadth above the lesser trochanter.  I then exposed the deep acetabulum, cleared out any tissue including the ligamentum teres.    I then prepared the proximal femur using the box cutter, Charnley awl, and then sequentially broached.  A trial utilized, and I reduced the hip, leg lengths were assessed clinically and felt to be equal. The hip was then taken through a full range of  motion, the hip was stable at full extension and 90 degrees external rotation without anterior subluxation. The hip was also stable in the position of sleep, and in neutral abduction up to 90 degrees flexion, and 90 degrees IR. The trial components were then removed.   We then prepared canal for cementation.  The cement restrictor was measured and inserted distally.  The canal was then irrigated with the pulse lavage and 3 L of normal saline.  2 bags of Simplex cement were prepared.  Using the cement gun the cement was inserted distally and the canal was filled.  We then pressurized the canal. The real implant was then inserted matching the patient's native anteversion of approximately 25 degrees.  We then waited for 13 minutes for the cement to be fully set.  Excess cement was removed.  A lap was placed in the acetabulum prior to cementing was also removed and the acetabulum was assessed to make sure there was no cement or bone fragments.  The hip was then reduced with the trial head again and taken through functional range of motion and found to have excellent stability. Leg lengths were restored. The real head was then impacted onto the stem and the hip was again reduced.  The capsule was then repaired with #2 Ethibond., and the piriformis was repaired to the abductor tendon. Excellent posterior capsular repair was achieved.   I then irrigated the hip copiously again with pulse lavage. The wounds were injected with 20cc exparal with 30 cc of quarter percent Marcaine with epinephrine. The fascia and IT band was repaired with #1 stratafix, followed by 0 stratafix for the fat layer followed by 2-0 Vicryl and running 3-0 Monocryl for the skin, Dermabond was applied and an Aquacel dressing was placed.  The patient was then awakened and returned to PACU in stable and satisfactory condition. There were no complications.  Post op recs: WB: WBAT   Abx: ancef  x23 hours post op Imaging: PACU xrays Dressing:  Aquacel dressing to be kept intact until follow-up DVT prophylaxis: ASA 81 mg twice daily x 4 weeks Follow up: 2 weeks after surgery for a wound check with Dr. Pryor Browning at [facility].  Address: [address], [place], [place] [address]  Office Phone: [phone]   Priscille Brought, MD Orthopedic Surgeon  06/03/2024 4:12 PM

## 2024-06-03 NOTE — Discharge Instructions (Addendum)
 INSTRUCTIONS AFTER JOINT REPLACEMENT   Remove items at home which could result in a fall. This includes throw rugs or furniture in walking pathways ICE to the affected joint every three hours while awake for 30 minutes at a time, for at least the first 3-5 days, and then as needed for pain and swelling.  Continue to use ice for pain and swelling. You may notice swelling that will progress down to the foot and ankle.  This is normal after surgery.  Elevate your leg when you are not up walking on it.   Continue to use the breathing machine you got in the hospital (incentive spirometer) which will help keep your temperature down.  It is common for your temperature to cycle up and down following surgery, especially at night when you are not up moving around and exerting yourself.  The breathing machine keeps your lungs expanded and your temperature down.  DIET:  As you were doing prior to hospitalization, we recommend a well-balanced diet.  DRESSING / WOUND CARE / SHOWERING:  Keep the surgical dressing until follow up.  The dressing is water proof, so you can shower without any extra covering.  IF THE DRESSING FALLS OFF or the wound gets wet inside, change the dressing with sterile gauze.  Please use good hand washing techniques before changing the dressing.  Do not use any lotions or creams on the incision until instructed by your surgeon.    ACTIVITY  Increase activity slowly as tolerated, but follow the weight bearing instructions below.   No driving for 6 weeks or until further direction given by your physician.  You cannot drive while taking narcotics.  No lifting or carrying greater than 10 lbs. until further directed by your surgeon. Avoid periods of inactivity such as sitting longer than an hour when not asleep. This helps prevent blood clots.  You may return to work once you are authorized by your doctor.   WEIGHT BEARING: Weight bearing as tolerated with assist device (walker, cane, etc) as  directed, use it as long as suggested by your surgeon or therapist, typically at least 4-6 weeks.  EXERCISES  Results after joint replacement surgery are often greatly improved when you follow the exercise, range of motion and muscle strengthening exercises prescribed by your doctor. Safety measures are also important to protect the joint from further injury. Any time any of these exercises cause you to have increased pain or swelling, decrease what you are doing until you are comfortable again and then slowly increase them. If you have problems or questions, call your caregiver or physical therapist for advice.   Rehabilitation is important following a joint replacement. After just a few days of immobilization, the muscles of the leg can become weakened and shrink (atrophy).  These exercises are designed to build up the tone and strength of the thigh and leg muscles and to improve motion. Often times heat used for twenty to thirty minutes before working out will loosen up your tissues and help with improving the range of motion but do not use heat for the first two weeks following surgery (sometimes heat can increase post-operative swelling).   These exercises can be done on a training (exercise) mat, on the floor, on a table or on a bed. Use whatever works the best and is most comfortable for you.    Use music or television while you are exercising so that the exercises are a pleasant break in your day. This will make your life  better with the exercises acting as a break in your routine that you can look forward to.   Perform all exercises about fifteen times, three times per day or as directed.  You should exercise both the operative leg and the other leg as well.  Exercises include:   Quad Sets - Tighten up the muscle on the front of the thigh (Quad) and hold for 5-10 seconds.   Straight Leg Raises - With your knee straight (if you were given a brace, keep it on), lift the leg to 60 degrees, hold  for 3 seconds, and slowly lower the leg.  Perform this exercise against resistance later as your leg gets stronger.  Leg Slides: Lying on your back, slowly slide your foot toward your buttocks, bending your knee up off the floor (only go as far as is comfortable). Then slowly slide your foot back down until your leg is flat on the floor again.  Angel Wings: Lying on your back spread your legs to the side as far apart as you can without causing discomfort.  Hamstring Strength:  Lying on your back, push your heel against the floor with your leg straight by tightening up the muscles of your buttocks.  Repeat, but this time bend your knee to a comfortable angle, and push your heel against the floor.  You may put a pillow under the heel to make it more comfortable if necessary.   A rehabilitation program following joint replacement surgery can speed recovery and prevent re-injury in the future due to weakened muscles. Contact your doctor or a physical therapist for more information on knee rehabilitation.   CONSTIPATION:  Constipation is defined medically as fewer than three stools per week and severe constipation as less than one stool per week.  Even if you have a regular bowel pattern at home, your normal regimen is likely to be disrupted due to multiple reasons following surgery.  Combination of anesthesia, postoperative narcotics, change in appetite and fluid intake all can affect your bowels.   YOU MUST use at least one of the following options; they are listed in order of increasing strength to get the job done.  They are all available over the counter, and you may need to use some, POSSIBLY even all of these options:    Drink plenty of fluids (prune juice may be helpful) and high fiber foods Colace 100 mg by mouth twice a day  Senokot for constipation as directed and as needed Dulcolax (bisacodyl), take with full glass of water  Miralax  (polyethylene glycol) once or twice a day as needed.  If you  have tried all these things and are unable to have a bowel movement in the first 3-4 days after surgery call either your surgeon or your primary doctor.    If you experience loose stools or diarrhea, hold the medications until you stool forms back up.  If your symptoms do not get better within 1 week or if they get worse, check with your doctor.  If you experience the worst abdominal pain ever or develop nausea or vomiting, please contact the office immediately for further recommendations for treatment.  ITCHING:  If you experience itching with your medications, try taking only a single pain pill, or even half a pain pill at a time.  You can also use Benadryl  over the counter for itching or also to help with sleep.   TED HOSE STOCKINGS:  Use stockings on both legs until for at least 2 weeks or  as directed by physician office. They may be removed at night for sleeping.  MEDICATIONS:  See your medication summary on the "After Visit Summary" that nursing will review with you.  You may have some home medications which will be placed on hold until you complete the course of blood thinner medication.  It is important for you to complete the blood thinner medication as prescribed.  Blood clot prevention (DVT Prophylaxis): After surgery you are at an increased risk for a blood clot.  You were prescribed a blood thinner, Aspirin  81mg , to be taken twice daily for a total of 4 weeks from surgery to help reduce your risk of getting a blood clot.  Signs of a pulmonary embolus (blood clot in the lungs) include sudden short of breath, feeling lightheaded or dizzy, chest pain with a deep breath, rapid pulse rapid breathing.  Signs of a blood clot in your arms or legs include new unexplained swelling and cramping, warm, red or darkened skin around the painful area.  Please call the office or 911 right away if these signs or symptoms develop.  PRECAUTIONS:   If you experience chest pain or shortness of breath - call  911 immediately for transfer to the hospital emergency department.   If you develop a fever greater that 101 F, purulent drainage from wound, increased redness or drainage from wound, foul odor from the wound/dressing, or calf pain - CONTACT YOUR SURGEON.                                                   FOLLOW-UP APPOINTMENTS:  If you do not already have a post-op appointment, please call the office for an appointment to be seen by your surgeon.  Guidelines for how soon to be seen are listed in your "After Visit Summary", but are typically between 2-3 weeks after surgery.  If you have a specialized bandage, you may be told to follow up 1 week after surgery.  POST-OPERATIVE OPIOID TAPER INSTRUCTIONS: It is important to wean off of your opioid medication as soon as possible. If you do not need pain medication after your surgery it is ok to stop day one. Opioids include: Codeine, Hydrocodone (Norco, Vicodin), Oxycodone (Percocet, oxycontin ) and hydromorphone amongst others.  Long term and even short term use of opiods can cause: Increased pain response Dependence Constipation Depression Respiratory depression And more.  Withdrawal symptoms can include Flu like symptoms Nausea, vomiting And more Techniques to manage these symptoms Hydrate well Eat regular healthy meals Stay active Use relaxation techniques(deep breathing, meditating, yoga) Do Not substitute Alcohol  to help with tapering If you have been on opioids for less than two weeks and do not have pain than it is ok to stop all together.  Plan to wean off of opioids This plan should start within one week post op of your joint replacement. Maintain the same interval or time between taking each dose and first decrease the dose.  Cut the total daily intake of opioids by one tablet each day Next start to increase the time between doses. The last dose that should be eliminated is the evening dose.   MAKE SURE YOU:  Understand these  instructions.  Get help right away if you are not doing well or get worse.    Thank you for letting us  be a part of your medical care  team.  It is a privilege we respect greatly.  We hope these instructions will help you stay on track for a fast and full recovery!     Please continue taking iron  pill and vitamin D . You will need a stool softener while you are on the iron  medicine.   Your torsemide , losartan , and spironolactone  were held because of your kidney function. You will need to see your PCP in 1 week to decide if these should be resumed.   Your coreg  was reduced to half dose because of your lower heart rates.   Your allopurinol  for your gout was changed to 50mg  every other day due to your kidney function this can be adjusted with your PCP.   Finally metformin  was held because of your kidney function and your excellent glucose control at 89 years old. Your PCP will decide with you when to resume this medicine.

## 2024-06-03 NOTE — Progress Notes (Signed)
 Subjective: Met with patient at bedside this morning.  Son is also at bedside.  She reportedly had a fall yesterday while ambulating.  Unable to weight-bear after the fall due to pain in the left hip and groin area.  She notes some soreness also in the left wrist and shoulder but overall tolerable compared to her hip pain.  She denies distal numbness and tingling.  Objective:   VITALS:   Vitals:   06/02/24 1900 06/02/24 1944 06/03/24 0453 06/03/24 0731  BP: 124/70 (!) 120/53 (!) 161/63 (!) 149/56  Pulse: (!) 56 (!) 53 (!) 56 (!) 56  Resp: 19 16 18 16   Temp:  98.8 F (37.1 C) 98.7 F (37.1 C) 98.7 F (37.1 C)  TempSrc:  Oral Oral   SpO2: 100% 99% 97% 99%  Weight:      Height:       LLE No traumatic wounds, ecchymosis, or rash  Nontender  groin pain with log roll  No knee or ankle effusion  Sens DPN, SPN, TN intact  Motor EHL, ext, flex 5/5  DP 2+, PT 2+, No significant edema  RLE No traumatic wounds, ecchymosis, or rash  Nontender  No groin pain with log roll  No knee or ankle effusion  Sens DPN, SPN, TN intact  Motor EHL, ext, flex 5/5  DP 2+, PT 2+, No significant edema   Lab Results  Component Value Date   WBC 7.8 06/02/2024   HGB 10.5 (L) 06/02/2024   HCT 31.0 (L) 06/02/2024   MCV 86.1 06/02/2024   PLT PLATELET CLUMPS NOTED ON SMEAR, UNABLE TO ESTIMATE 06/02/2024   BMET    Component Value Date/Time   NA 137 06/03/2024 0540   K 3.8 06/03/2024 0540   CL 107 06/03/2024 0540   CO2 21 (L) 06/03/2024 0540   GLUCOSE 88 06/03/2024 0540   GLUCOSE 87 01/06/2007 1521   BUN 24 (H) 06/03/2024 0540   CREATININE 1.42 (H) 06/03/2024 0540   CREATININE 1.35 (H) 08/18/2020 1430   CALCIUM  8.6 (L) 06/03/2024 0540   GFRNONAA 35 (L) 06/03/2024 0540   GFRNONAA 35 (L) 08/18/2020 1430      Xray: X-rays and CT scan pelvis left hip demonstrate a mildly displaced left femoral neck fracture  Assessment/Plan:     Principal Problem:   Closed subcapital fracture of  neck of femur, left, initial encounter (HCC) Active Problems:   DM2 (diabetes mellitus, type 2) (HCC)   Gout   HTN (hypertension)   History of cardioembolic cerebrovascular accident (CVA)   GERD (gastroesophageal reflux disease)   Primary open angle glaucoma of both eyes, indeterminate stage   Normocytic anemia   Hyperlipidemia   Parkinsonian features   Stage 3b chronic kidney disease (CKD) (HCC)   Depression   Charles Bonnet syndrome   Blindness of both eyes  Left mildly displaced femoral neck fracture  Discussed radiographic findings with patient and son at bedside.  Has a mildly displaced left femoral neck fracture.  Discussed with treatment options including percutaneous screw fixation versus hemiarthroplasty.  Discussed risk of pinning including nonunion malunion of about 10 to 15% given the fracture pattern she has.  Discussed if this occurs would benefit from conversion to total hip arthroplasty.  Discussed also risk and benefits of hip hemiarthroplasty.  Risks including nerve injury, bleeding, malunion, nonunion, periprosthetic fracture, dislocation were discussed.  After discussion patient and son would like to proceed with hip hemiarthroplasty given patient's age and lower risk of possible revision  or conversion surgery.    Isac Lincks A Sergei Delo 06/03/2024, 8:48 AM   Priscille Brought, MD  Contact information:   [phone] 7am-5pm epic message Dr. Pryor Browning, or call office for patient follow up: [phone] After hours and holidays please check [email or web address] for group call information for [facility]

## 2024-06-04 ENCOUNTER — Inpatient Hospital Stay (HOSPITAL_COMMUNITY)

## 2024-06-04 ENCOUNTER — Ambulatory Visit: Admitting: Internal Medicine

## 2024-06-04 DIAGNOSIS — S72012D Unspecified intracapsular fracture of left femur, subsequent encounter for closed fracture with routine healing: Secondary | ICD-10-CM | POA: Diagnosis not present

## 2024-06-04 LAB — GLUCOSE, CAPILLARY: Glucose-Capillary: 126 mg/dL — ABNORMAL HIGH (ref 70–99)

## 2024-06-04 LAB — BASIC METABOLIC PANEL WITH GFR
Anion gap: 10 (ref 5–15)
BUN: 21 mg/dL (ref 8–23)
CO2: 22 mmol/L (ref 22–32)
Calcium: 7.9 mg/dL — ABNORMAL LOW (ref 8.9–10.3)
Chloride: 103 mmol/L (ref 98–111)
Creatinine, Ser: 1.62 mg/dL — ABNORMAL HIGH (ref 0.44–1.00)
GFR, Estimated: 30 mL/min — ABNORMAL LOW (ref >60)
Glucose, Bld: 213 mg/dL — ABNORMAL HIGH (ref 70–99)
Potassium: 3.9 mmol/L (ref 3.5–5.1)
Sodium: 135 mmol/L (ref 135–145)

## 2024-06-04 LAB — CBC
HCT: 25 % — ABNORMAL LOW (ref 36.0–46.0)
Hemoglobin: 8.1 g/dL — ABNORMAL LOW (ref 12.0–15.0)
MCH: 27.1 pg (ref 26.0–34.0)
MCHC: 32.4 g/dL (ref 30.0–36.0)
MCV: 83.6 fL (ref 80.0–100.0)
Platelets: 86 10*3/uL — ABNORMAL LOW (ref 150–400)
RBC: 2.99 MIL/uL — ABNORMAL LOW (ref 3.87–5.11)
RDW: 16.9 % — ABNORMAL HIGH (ref 11.5–15.5)
WBC: 8.2 10*3/uL (ref 4.0–10.5)
nRBC: 0 % (ref 0.0–0.2)

## 2024-06-04 LAB — HEMOGLOBIN A1C
Hgb A1c MFr Bld: 6.2 % — ABNORMAL HIGH (ref 4.8–5.6)
Mean Plasma Glucose: 131.24 mg/dL

## 2024-06-04 MED ORDER — ONDANSETRON HCL 4 MG PO TABS: 4.0000 mg | ORAL_TABLET | Freq: Three times a day (TID) | ORAL | 0 refills | Status: AC | PRN

## 2024-06-04 MED ORDER — OXYCODONE HCL 5 MG PO TABS
2.5000 mg | ORAL_TABLET | Freq: Four times a day (QID) | ORAL | Status: DC | PRN
  Filled 2024-06-04: qty 1

## 2024-06-04 MED ORDER — INSULIN ASPART 100 UNIT/ML IJ SOLN
0.0000 [IU] | Freq: Three times a day (TID) | INTRAMUSCULAR | Status: DC
  Administered 2024-06-05: 2 [IU] via SUBCUTANEOUS

## 2024-06-04 MED ORDER — ASPIRIN 81 MG PO TBEC: 81.0000 mg | DELAYED_RELEASE_TABLET | Freq: Two times a day (BID) | ORAL | Status: DC

## 2024-06-04 MED ORDER — CARVEDILOL 12.5 MG PO TABS
12.5000 mg | ORAL_TABLET | Freq: Two times a day (BID) | ORAL | Status: DC
  Administered 2024-06-06: 12.5 mg via ORAL
  Filled 2024-06-04 (×2): qty 1

## 2024-06-04 MED ORDER — DEXTROSE IN LACTATED RINGERS 5 % IV SOLN: INTRAVENOUS | Status: DC

## 2024-06-04 MED ORDER — GABAPENTIN 100 MG PO CAPS
100.0000 mg | ORAL_CAPSULE | Freq: Every day | ORAL | Status: DC
  Administered 2024-06-04 – 2024-06-05 (×2): 100 mg via ORAL
  Filled 2024-06-04 (×2): qty 1

## 2024-06-04 MED ORDER — TRAMADOL HCL 50 MG PO TABS: 50.0000 mg | ORAL_TABLET | Freq: Four times a day (QID) | ORAL | 0 refills | Status: DC | PRN

## 2024-06-04 MED ORDER — LACTATED RINGERS IV SOLN: INTRAVENOUS | Status: AC

## 2024-06-04 NOTE — Progress Notes (Signed)
     1 Day Post-Op Procedure(s) (LRB): HEMIARTHROPLASTY (BIPOLAR) HIP, POSTERIOR APPROACH FOR FRACTURE (Left) Subjective:  Patient reports pain as mild.  Slept okay.  Hopeful to mobilize with PT today.  No overnight events.  Denies chest pain, ShOB, N/V.  Denies numbness or tingling.  No other complaints at this time.  Objective:   VITALS:   Vitals:   06/03/24 1940 06/04/24 0006 06/04/24 0421 06/04/24 0421  BP: (!) 167/71 (!) 153/70 (!) 154/81 (!) 154/81  Pulse: (!) 54 (!) 50 (!) 57 (!) 57  Resp: 15 15 17 18   Temp: 98 F (36.7 C) 98.9 F (37.2 C) 98.6 F (37 C) 98.6 F (37 C)  TempSrc:   Oral   SpO2: 99% 99% 100% 100%  Weight:      Height:        AAOx4 resting comfortably, in a NAD Neurologically intact Sensation intact distally Intact pulses distally Dorsiflexion/Plantar flexion intact Incision: dressing C/D/I Wiggles toes appropriately   Lab Results  Component Value Date   WBC 8.2 06/04/2024   HGB 8.1 (L) 06/04/2024   HCT 25.0 (L) 06/04/2024   MCV 83.6 06/04/2024   PLT 86 (L) 06/04/2024   BMET    Component Value Date/Time   NA 135 06/04/2024 1337   K 3.9 06/04/2024 1337   CL 103 06/04/2024 1337   CO2 22 06/04/2024 1337   GLUCOSE 213 (H) 06/04/2024 1337   GLUCOSE 87 01/06/2007 1521   BUN 21 06/04/2024 1337   CREATININE 1.62 (H) 06/04/2024 1337   CREATININE 1.35 (H) 08/18/2020 1430   CALCIUM  7.9 (L) 06/04/2024 1337   GFRNONAA 30 (L) 06/04/2024 1337   GFRNONAA 35 (L) 08/18/2020 1430     Xray: stable post-operative imaging    Assessment/Plan: 1 Day Post-Op   Principal Problem:   Closed subcapital fracture of neck of femur, left, initial encounter (HCC) Active Problems:   DM2 (diabetes mellitus, type 2) (HCC)   Gout   HTN (hypertension)   History of cardioembolic cerebrovascular accident (CVA)   GERD (gastroesophageal reflux disease)   Primary open angle glaucoma of both eyes, indeterminate stage   Normocytic anemia   Hyperlipidemia    Parkinsonian features   Stage 3b chronic kidney disease (CKD) (HCC)   Depression   Charles Bonnet syndrome   Blindness of both eyes   Post op recs: WB: WBAT   Abx: ancef  x23 hours post op Imaging: PACU xrays Dressing: Aquacel dressing to be kept intact until follow-up DVT prophylaxis: ASA 81 mg twice daily x 4 weeks Follow up: 2 weeks after surgery for a wound check with Dr. Pryor Browning at [facility].  Address: [address], [place], [place] [address]  Office Phone: [phone]   Albertus Alt 06/04/2024, 7:00 AM   Contact information:   Weekdays 7am-5pm epic message Dr. Pryor Browning, or call office for patient follow up: [phone] After hours and holidays please check [email or web address] for group call information for [facility]

## 2024-06-04 NOTE — Telephone Encounter (Signed)
 I know.  I am sorry!  I wish her a speedy recovery.  Thanks

## 2024-06-04 NOTE — NC FL2 (Signed)
 [place]  MEDICAID FL2 LEVEL OF CARE FORM     IDENTIFICATION  Patient Name: Breanna [name] Birthdate: 1931-09-08 Sex: female Admission Date (Current Location): 06/02/2024  [facility] and [place] Number:  Producer, television/film/video and Address:  [facility], [address] [place], [place] [address]      Provider Number: [id number]  Attending Physician Name and Address:  Joette Mustard, MD  Relative Name and Phone Number:  [place], Lenderman [phone]    Current Level of Care: Hospital Recommended Level of Care: Skilled Nursing Facility Prior Approval Number:    Date Approved/Denied:   PASRR Number: [id number]  Discharge Plan: SNF    Current Diagnoses: Patient Active Problem List   Diagnosis Date Noted   Closed subcapital fracture of neck of femur, left, initial encounter (HCC) 06/02/2024   Insomnia 02/26/2024   Pressure sore 02/14/2024   Lower gastrointestinal bleeding 01/23/2024   Acute blood loss anemia 01/22/2024   Diverticulosis of colon with hemorrhage 01/20/2024   ABLA (acute blood loss anemia) 01/18/2024   Heme positive stool 01/18/2024   Anemia 01/18/2024   Thrombocytopenia (HCC) 01/17/2024   Blindness of both eyes 10/17/2023   Stress    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   Headache 10/16/2023   Ankle pain, left 06/14/2023   Leg abrasion 06/14/2023   Elevated troponin 03/01/2023   Stage 3b chronic kidney disease (CKD) (HCC) 03/01/2023   Depression 03/01/2023   Left shoulder pain 02/28/2023   Pes anserinus bursitis of both knees 05/23/2022   Gait disorder 03/07/2022   Falls frequently 11/29/2021   Vision loss 11/29/2021   Parkinsonian features 05/23/2021   Atherosclerosis of aorta (HCC) 05/23/2021   Normocytic anemia 08/18/2020   Constipation 08/18/2020   Sinus bradycardia 07/21/2020   Hip pain 07/06/2020   Acute pain of right knee 09/28/2018   Well adult exam 05/13/2018   Night sweats 04/04/2018   Breast symptom 10/30/2017    Diarrhea 08/21/2017   Lung nodule 08/21/2017   FTT (failure to thrive) in adult 08/21/2017   Shortness of breath 08/13/2017   Cough 08/13/2017   Fatigue 08/13/2017   Blurry vision 08/13/2017   Shoulder pain, right 07/25/2017   Callus of foot 06/07/2017   Need for prophylactic vaccination and inoculation against influenza 11/21/2016   Trigger finger, acquired 11/12/2016   Meteorism 07/11/2016   Dark stools 01/10/2016   Osteoporosis 07/13/2015   Cornea disorder 11/22/2014   Primary open angle glaucoma of both eyes, indeterminate stage 11/22/2014   Secondary corneal edema 10/14/2014   Swelling of left knee joint 04/07/2014   Grief 01/04/2014   Syncope 01/04/2014   Thoracic spine pain 10/22/2013   Hypernatremia 07/07/2013   Diabetic neuropathy (HCC) 07/30/2012   Neck pain on right side 07/30/2012   Cystoid macular edema 06/19/2012   History of corneal transplant 06/19/2012   Weight loss 04/10/2012   PARESTHESIA 01/03/2011   Full incontinence of feces 01/01/2011   LEG PAIN, LEFT 10/02/2010   History of cardioembolic cerebrovascular accident (CVA) 03/27/2010   GERD (gastroesophageal reflux disease) 03/27/2010   CHOLELITHIASIS 03/27/2010   Cerebral artery occlusion with cerebral infarction (HCC) 03/27/2010   LOW BACK PAIN 09/07/2009   History of anemia due to chronic kidney disease 06/14/2009   Dysphagia 05/16/2009   Dyslipidemia 11/06/2007   Hyperlipidemia 11/06/2007   DM2 (diabetes mellitus, type 2) (HCC) 07/29/2007   Gout 07/29/2007   HTN (hypertension) 07/29/2007   Hydronephrosis 07/29/2007   Osteoarthritis 07/29/2007    Orientation RESPIRATION BLADDER  Height & Weight     Self, Time, Situation, Place  Normal Continent Weight: 109 lb (49.4 kg) Height:  5' 2 (157.5 cm)  BEHAVIORAL SYMPTOMS/MOOD NEUROLOGICAL BOWEL NUTRITION STATUS      Continent Diet (see discharge summary)  AMBULATORY STATUS COMMUNICATION OF NEEDS Skin   Total Care Verbally Surgical wounds                        Personal Care Assistance Level of Assistance  Bathing, Feeding, Dressing Bathing Assistance: Limited assistance Feeding assistance: Limited assistance Dressing Assistance: Limited assistance     Functional Limitations Info  Sight, Hearing, Speech Sight Info: Impaired Hearing Info: Adequate Speech Info: Adequate    SPECIAL CARE FACTORS FREQUENCY  PT (By licensed PT), OT (By licensed OT)     PT Frequency: 5x week OT Frequency: 5x week            Contractures Contractures Info: Not present    Additional Factors Info  Code Status, Allergies Code Status Info: full Allergies Info: Verapamil, Aspirin , Atenolol, Codeine, Codeine Sulfate, Hydrochlorothiazide, Hydrocodone , Ibuprofen , Lisinopril, Onion, Shellfish Allergy, Statins, Valsartan, Adhesive  (Tape), Other           Current Medications (06/04/2024):  This is the current hospital active medication list Current Facility-Administered Medications  Medication Dose Route Frequency Provider Last Rate Last Admin   acetaminophen  (TYLENOL ) tablet 650 mg  650 mg Oral Q4H Cockerham, Alicia M, PA-C   650 mg at 06/04/24 1229   allopurinol  (ZYLOPRIM ) tablet 50 mg  50 mg Oral Daily Cockerham, Alicia M, PA-C   50 mg at 06/04/24 4010   amLODipine  (NORVASC ) tablet 10 mg  10 mg Oral Daily Cockerham, Alicia M, PA-C   10 mg at 06/04/24 2725   artificial tears ophthalmic solution 1 drop  1 drop Both Eyes PRN Cockerham, Alicia M, PA-C   1 drop at 06/02/24 2356   aspirin  EC tablet 81 mg  81 mg Oral BID Cockerham, Alicia M, PA-C   81 mg at 06/04/24 0855   atropine  1 % ophthalmic solution 1 drop  1 drop Both Eyes Daily Cockerham, Alicia M, PA-C   1 drop at 06/04/24 3664   carvedilol  (COREG ) tablet 12.5 mg  12.5 mg Oral BID WC Joette Mustard, MD       dorzolamide -timolol  (COSOPT ) 2-0.5 % ophthalmic solution 1 drop  1 drop Both Eyes BID Cockerham, Alicia M, PA-C   1 drop at 06/04/24 0857   gabapentin  (NEURONTIN ) capsule 100 mg   100 mg Oral BID Cockerham, Alicia M, PA-C   100 mg at 06/04/24 4034   latanoprost  (XALATAN ) 0.005 % ophthalmic solution 1 drop  1 drop Both Eyes QHS Cockerham, Alicia M, PA-C   1 drop at 06/03/24 2139   ondansetron  (ZOFRAN -ODT) disintegrating tablet 4 mg  4 mg Oral Q8H PRN Cockerham, Alicia M, PA-C       oxyCODONE  (Oxy IR/ROXICODONE ) immediate release tablet 2.5 mg  2.5 mg Oral Q6H PRN Joette Mustard, MD       pantoprazole  (PROTONIX ) EC tablet 40 mg  40 mg Oral Daily Cockerham, Alicia M, PA-C   40 mg at 06/04/24 7425   polyethylene glycol (MIRALAX  / GLYCOLAX ) packet 17 g  17 g Oral Daily PRN Cockerham, Alicia M, PA-C       rosuvastatin  (CRESTOR ) tablet 5 mg  5 mg Oral Daily Cockerham, Alicia M, PA-C   5 mg at 06/04/24 0855   sertraline  (ZOLOFT ) tablet 100 mg  100 mg Oral QHS Cockerham, Alicia M, PA-C   100 mg at 06/03/24 2139     Discharge Medications: Please see discharge summary for a list of discharge medications.  Relevant Imaging Results:  Relevant Lab Results:   Additional Information SSN: [id number]  Gloria Lares Merrie Abed, LCSW

## 2024-06-04 NOTE — TOC Initial Note (Signed)
 Transition of Care [facility]) - Initial/Assessment Note    Patient Details  Name: Breanna [name] MRN: [id number] Date of Birth: Aug 13, 1931  Transition of [facility]) CM/SW Contact:    Elspeth Hals, LCSW Phone Number: 06/04/2024, 2:10 PM  Clinical Narrative:    CSW met with pt and son Leighton Punches regarding PT recommendation for SNF.  They are agreeable to SNF, medicare choice document provided, permission given to send out referral in the hub.  Permission given to speak with all 4 children.  Pt lives at home, her children take turns staying with her.  She also has HH aide 4 days/week, 2 hours per day through [facility].     Referral sent out in hub for SNF.              Expected Discharge Plan: Skilled Nursing Facility Barriers to Discharge: Continued Medical Work up, SNF Pending bed offer   Patient Goals and CMS Choice Patient states their goals for this hospitalization and ongoing recovery are:: walking CMS Medicare.gov Compare Post Acute Care list provided to:: Patient Represenative (must comment) (son Leighton Punches) Choice offered to / list presented to : Patient, Adult Children      Expected Discharge Plan and Services In-house Referral: Clinical Social Work   Post Acute Care Choice: Skilled Nursing Facility Living arrangements for the past 2 months: Single Family Home                                      Prior Living Arrangements/Services Living arrangements for the past 2 months: Single Family Home Lives with:: Relatives (pt children take turns staying with her) Patient language and need for interpreter reviewed:: Yes Do you feel safe going back to the place where you live?: Yes      Need for Family Participation in Patient Care: Yes (Comment) Care giver support system in place?: Yes (comment) Current home services: Homehealth aide ([facility] HH aide) Criminal Activity/Legal Involvement Pertinent to Current Situation/Hospitalization: No - Comment as needed  Activities of Daily  Living      Permission Sought/Granted Permission sought to share information with : Family Supports Permission granted to share information with : Yes, Verbal Permission Granted  Share Information with NAME: son Leighton Punches, son Mara [place], daughters Johnanna Mylar and Diane  Permission granted to share info w AGENCY: SNF        Emotional Assessment Appearance:: Appears stated age Attitude/Demeanor/Rapport: Engaged Affect (typically observed): Pleasant Orientation: : Oriented to Self, Oriented to Place, Oriented to  Time, Oriented to Situation      Admission diagnosis:  Closed fracture of left hip, initial encounter (HCC) [S72.002A] Closed subcapital fracture of neck of femur, left, initial encounter (HCC) [S72.012A] Patient Active Problem List   Diagnosis Date Noted   Closed subcapital fracture of neck of femur, left, initial encounter (HCC) 06/02/2024   Insomnia 02/26/2024   Pressure sore 02/14/2024   Lower gastrointestinal bleeding 01/23/2024   Acute blood loss anemia 01/22/2024   Diverticulosis of colon with hemorrhage 01/20/2024   ABLA (acute blood loss anemia) 01/18/2024   Heme positive stool 01/18/2024   Anemia 01/18/2024   Thrombocytopenia (HCC) 01/17/2024   Blindness of both eyes 10/17/2023   Stress    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   Headache 10/16/2023   Ankle pain, left 06/14/2023   Leg abrasion 06/14/2023   Elevated troponin 03/01/2023   Stage 3b chronic kidney disease (CKD) (HCC) 03/01/2023  Depression 03/01/2023   Left shoulder pain 02/28/2023   Pes anserinus bursitis of both knees 05/23/2022   Gait disorder 03/07/2022   Falls frequently 11/29/2021   Vision loss 11/29/2021   Parkinsonian features 05/23/2021   Atherosclerosis of aorta (HCC) 05/23/2021   Normocytic anemia 08/18/2020   Constipation 08/18/2020   Sinus bradycardia 07/21/2020   Hip pain 07/06/2020   Acute pain of right knee 09/28/2018   Well adult exam 05/13/2018   Night sweats 04/04/2018    Breast symptom 10/30/2017   Diarrhea 08/21/2017   Lung nodule 08/21/2017   FTT (failure to thrive) in adult 08/21/2017   Shortness of breath 08/13/2017   Cough 08/13/2017   Fatigue 08/13/2017   Blurry vision 08/13/2017   Shoulder pain, right 07/25/2017   Callus of foot 06/07/2017   Need for prophylactic vaccination and inoculation against influenza 11/21/2016   Trigger finger, acquired 11/12/2016   Meteorism 07/11/2016   Dark stools 01/10/2016   Osteoporosis 07/13/2015   Cornea disorder 11/22/2014   Primary open angle glaucoma of both eyes, indeterminate stage 11/22/2014   Secondary corneal edema 10/14/2014   Swelling of left knee joint 04/07/2014   Grief 01/04/2014   Syncope 01/04/2014   Thoracic spine pain 10/22/2013   Hypernatremia 07/07/2013   Diabetic neuropathy (HCC) 07/30/2012   Neck pain on right side 07/30/2012   Cystoid macular edema 06/19/2012   History of corneal transplant 06/19/2012   Weight loss 04/10/2012   PARESTHESIA 01/03/2011   Full incontinence of feces 01/01/2011   LEG PAIN, LEFT 10/02/2010   History of cardioembolic cerebrovascular accident (CVA) 03/27/2010   GERD (gastroesophageal reflux disease) 03/27/2010   CHOLELITHIASIS 03/27/2010   Cerebral artery occlusion with cerebral infarction (HCC) 03/27/2010   LOW BACK PAIN 09/07/2009   History of anemia due to chronic kidney disease 06/14/2009   Dysphagia 05/16/2009   Dyslipidemia 11/06/2007   Hyperlipidemia 11/06/2007   DM2 (diabetes mellitus, type 2) (HCC) 07/29/2007   Gout 07/29/2007   HTN (hypertension) 07/29/2007   Hydronephrosis 07/29/2007   Osteoarthritis 07/29/2007   PCP:  Genia Kettering, MD Pharmacy:   [facility] [phone] Jonette Nestle, [place] - [address] [place] [address] Phone: [phone] Fax: [phone]     Social Drivers of Health (SDOH) Social History: SDOH Screenings   Food Insecurity: No Food Insecurity (06/03/2024)  Housing:  Low Risk  (06/03/2024)  Transportation Needs: No Transportation Needs (06/03/2024)  Utilities: Not At Risk (06/03/2024)  Alcohol  Screen: Low Risk  (04/23/2024)  Depression (PHQ2-9): Low Risk  (05/22/2024)  Financial Resource Strain: Low Risk  (02/12/2024)  Physical Activity: Inactive (02/12/2024)  Social Connections: Moderately Integrated (06/03/2024)  Stress: No Stress Concern Present (04/23/2024)  Tobacco Use: Low Risk  (06/04/2024)  Health Literacy: Inadequate Health Literacy (12/06/2023)   SDOH Interventions:     Readmission Risk Interventions    01/27/2024    1:14 PM 01/20/2024    3:32 PM  Readmission Risk Prevention Plan  Transportation Screening Complete Complete  PCP or Specialist Appt within 3-5 Days Complete Complete  HRI or Home Care Consult Complete Complete  Social Work Consult for Recovery Care Planning/Counseling Complete Complete  Palliative Care Screening Not Applicable Not Applicable  Medication Review Oceanographer) Complete Complete

## 2024-06-04 NOTE — Telephone Encounter (Signed)
 The patient is in the hospital currently.  Thanks

## 2024-06-04 NOTE — Progress Notes (Signed)
  Progress Note   Patient: Breanna [name] [id number] DOB: 1931/01/29 DOA: 06/02/2024     2 DOS: the patient was seen and examined on 06/03/2024   Brief hospital course: Per H&P HPI    Breanna [name] is a 89 y.o. female with medical history significant of hypertension, hyperlipidemia, CKD 3B, anemia, CVA, GERD, gout, diabetes, depression, parkinsonian features, glaucoma, blindness, Breanna [name] syndrome presenting after fall at home.   Patient was reportedly heading to the bathroom and reaching out for the commode when she slipped and fell.  She landed on her left side and did hit her head on the ground.  Did briefly lose consciousness.  Afterwards reported left hip pain and has not ambulated since.  Contusion to her head and also noted some mild knee swelling.  EMS reported heart rate in the 40s.   Denies fevers, chills, chest pain, shortness of breath, abdominal pain, constipation, diarrhea, nausea, vomiting.  Assessment and Plan: L hip fracture   Plan for OR this afternoon.   AKI on CKD 3a  BL serum creatinine around 1.  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       Subjective: Patient sitting in bed comfortable  Physical Exam:  Physical Exam  Constitutional: In no distress. Lying flat  Cardiovascular: Normal rate, regular rhythm. No lower extremity edema  Pulmonary: Non labored  breathing on room air, no wheezing or rales.   Abdominal: Soft. Normal bowel sounds. Non distended and non tender Musculoskeletal: Normal range of motion.     Neurological: Alert and oriented to person, place, and time. Able to feel in both extremities. Able to move feet.  Skin: Skin is warm and dry.   Data Reviewed:  BMP and CBC reviewed   Family Communication: Discussed with son at bedside   Disposition: Status is: Inpatient Remains inpatient appropriate because: Hip fracture awaiting surgery   Planned Discharge Destination: Skilled nursing facility    Time spent: 35 minutes  Author: Joette Mustard, MD 06/03/2024   For on call review [email or web address]

## 2024-06-04 NOTE — Progress Notes (Addendum)
 Progress Note   Patient: Breanna [name] [id number] DOB: 1931-10-15 DOA: 06/02/2024     2 DOS: the patient was seen and examined on 06/04/2024   Brief hospital course: Per H&P HPI HPI: Breanna [name] is a 89 y.o. female with medical history significant of hypertension, hyperlipidemia, CKD 3B, anemia, CVA, GERD, gout, diabetes, depression, parkinsonian features, glaucoma, blindness, Breanna [name] syndrome presenting after fall at home.   Patient was reportedly heading to the bathroom and reaching out for the commode when she slipped and fell.  She landed on her left side and did hit her head on the ground.  Did briefly lose consciousness.  Afterwards reported left hip pain and has not ambulated since.  Contusion to her head and also noted some mild knee swelling.  EMS reported heart rate in the 40s.   Denies fevers, chills, chest pain, shortness of breath, abdominal pain, constipation, diarrhea, nausea, vomiting.  Assessment and Plan: L hip fracture  After mechanical fall.  S/p OR with orthopedics 6/11 Pain control APAP and low dose oxycodone  for severe pain    AKI on CKD3a Baseline around 1     Latest Ref Rng & Units 06/04/2024    1:37 PM 06/03/2024    5:40 AM 06/02/2024    5:24 PM  BMP  Glucose 70 - 99 mg/dL 518  88  841   BUN 8 - 23 mg/dL 21  24  23    Creatinine 0.44 - 1.00 mg/dL 6.60  6.30  1.60   Sodium 135 - 145 mmol/L 135  137  140   Potassium 3.5 - 5.1 mmol/L 3.9  3.8  3.9   Chloride 98 - 111 mmol/L 103  107  109   CO2 22 - 32 mmol/L 22  21    Calcium  8.9 - 10.3 mg/dL 7.9  8.6     Continue to monitor   HTN Resumed amlodipine  Holding torsemide , spiro, and losartan  in the setting of AKI  Resumed coreg  at lower dose  HLD Noted On statin at home.   Normocytic anemia  Iron  deficiency  Iron /TIBC/Ferritin/ %Sat    Component Value Date/Time   IRON  42 (L) 04/16/2024 1438   TIBC 294 04/16/2024 1438   FERRITIN 44 04/16/2024 1438   IRONPCTSAT 14 (L) 04/16/2024  1438   Iron  deficiency noted on April labs. Likely component of post op blood loss.      Latest Ref Rng & Units 06/04/2024    1:37 PM 06/03/2024    5:40 AM 06/02/2024    5:24 PM  CBC  WBC 4.0 - 10.5 K/uL 8.2  6.6    Hemoglobin 12.0 - 15.0 g/dL 8.1  9.0  10.9   Hematocrit 36.0 - 46.0 % 25.0  28.2  31.0   Platelets 150 - 400 K/uL 86  102     Recheck iron  panel.    Thrombocytopenia Noted to have clumps on previous  CBC.  Will continue to monitor.   H/o CVA Resume ASA  Statin   H/o gout Hold allopurinol  in setting of AKI   T2DM Holding home metformin   SSI and will transition to basal bolus regimen while hospitalized   Depression Home sertraline   GERD  Noted.   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  Has syndrome which she has hallucinations due to her vision loss that she knows a       Subjective: Feels pain is well controlled. Soreness. Able to walk with PT earlier. Had some increased  confusion with tramadol .   Physical Exam: Vitals:   06/04/24 0006 06/04/24 0421 06/04/24 0421 06/04/24 0755  BP: (!) 153/70 (!) 154/81 (!) 154/81 (!) 175/74  Pulse: (!) 50 (!) 57 (!) 57 62  Resp: 15 17 18    Temp: 98.9 F (37.2 C) 98.6 F (37 C) 98.6 F (37 C) 98.1 F (36.7 C)  TempSrc:  Oral    SpO2: 99% 100% 100%   Weight:      Height:       Physical Exam  Constitutional: In no distress.  HEENT: blind  Cardiovascular: Normal rate, regular rhythm. No lower extremity edema  Pulmonary: Non labored breathing on room air, no wheezing or rales.   Abdominal: Soft. Normal bowel sounds. Non distended and non tender Musculoskeletal: Normal range of motion.     Neurological: Alert and oriented to person, place, and time. Non focal  Incision bandaged, minimal surrounding swelling.  Skin: Skin is warm and dry.   Data Reviewed:     Latest Ref Rng & Units 06/03/2024    5:40 AM 06/02/2024    5:24 PM 06/02/2024    4:09 PM  BMP  Glucose 70  - 99 mg/dL 88  440  102   BUN 8 - 23 mg/dL 24  23  25    Creatinine 0.44 - 1.00 mg/dL 7.25  3.66  4.40   Sodium 135 - 145 mmol/L 137  140  138   Potassium 3.5 - 5.1 mmol/L 3.8  3.9  4.0   Chloride 98 - 111 mmol/L 107  109  107   CO2 22 - 32 mmol/L 21   23   Calcium  8.9 - 10.3 mg/dL 8.6   8.9      Family Communication: Son at bedside   Disposition: Status is: Inpatient Remains inpatient appropriate because: Recent hip fracture and OR   Planned Discharge Destination: Skilled nursing facility    Time spent: 35 minutes  Author: Joette Mustard, MD 06/04/2024 1:07 PM  For on call review [email or web address]

## 2024-06-04 NOTE — Evaluation (Signed)
 Physical Therapy Evaluation Patient Details Name: Breanna [name] MRN: [id number] DOB: 02-09-1931 Today's Date: 06/04/2024  History of Present Illness  Patient is a 89 year old female with fall while ambulating. Found to have mildly displaced left femoral neck fracture s/p left hip hemiarthroplasty. PMH: HTN, CKD 3, anemia, CVA, GERD, gout, DM, depression, parkinsonian features, glaucoma, blindness, [name].   Clinical Impression  Patient is agreeable to PT evaluation. Supportive son at the bedside. The patient has great family caregiver support at home. She is ambulatory with a rolling walker at baseline and has vision impairments.  Today the patient required Mod A for bed mobility. She was able to stand with Min A using rolling walker. Pre-gait activity performed with weight shifting while standing. Unable to take steps. She prefers to do as much as possible without physical assistance as able. She has had a decline with functional independence from baseline. Patient and family interested in rehab placement at discharge. Recommend rehabilitation < 3 hours/day after this hospital stay. PT will continue to follow to maximize independence and decrease caregiver burden.       If plan is discharge home, recommend the following: A lot of help with walking and/or transfers;A lot of help with bathing/dressing/bathroom;Help with stairs or ramp for entrance;Assistance with cooking/housework   Can travel by private vehicle   No    Equipment Recommendations BSC/3in1  Recommendations for Other Services  OT consult    Functional Status Assessment Patient has had a recent decline in their functional status and demonstrates the ability to make significant improvements in function in a reasonable and predictable amount of time.     Precautions / Restrictions Precautions Precautions: Fall Recall of Precautions/Restrictions: Intact Restrictions Weight Bearing Restrictions Per Provider  Order: Yes LLE Weight Bearing Per Provider Order: Weight bearing as tolerated      Mobility  Bed Mobility Overal bed mobility: Needs Assistance Bed Mobility: Supine to Sit, Sit to Supine     Supine to sit: Mod assist Sit to supine: Mod assist   General bed mobility comments: multi modal cues for sequencing and technique.    Transfers Overall transfer level: Needs assistance Equipment used: Rolling walker (2 wheels) Transfers: Sit to/from Stand Sit to Stand: Min assist           General transfer comment: cues for anterior weight shifting and hand placement. patient relying heavily on the bed for posterior leg support to stand. she prefers to do as much as possible without physical assistance from therapist. increased time and effort required    Ambulation/Gait             Pre-gait activities: weight shifting encouraged to facilitate weight beaing in preparation for progressive ambulation. no knee buckling noted. General Gait Details: unable to take steps away from the bed at this time  Stairs            Wheelchair Mobility     Tilt Bed    Modified Rankin (Stroke Patients Only)       Balance Overall balance assessment: Needs assistance Sitting-balance support: Feet supported Sitting balance-Leahy Scale: Fair     Standing balance support: Bilateral upper extremity supported Standing balance-Leahy Scale: Poor Standing balance comment: external support required with heavy reliance on rolling walker                             Pertinent Vitals/Pain Pain Assessment Pain Assessment: Faces Faces Pain Scale: Hurts  even more Pain Location: L hip with movement Pain Descriptors / Indicators: Discomfort Pain Intervention(s): Monitored during session, Limited activity within patient's tolerance, Repositioned    Home Living Family/patient expects to be discharged to:: Private residence Living Arrangements:  (children rotate taking care of  her) Available Help at Discharge: Available 24 hours/day Type of Home: House Home Access: Stairs to enter Entrance Stairs-Rails: Can reach both Entrance Stairs-Number of Steps: 5   Home Layout: One level Home Equipment: Cane - quad;Cane - single point;Shower Counsellor (2 wheels)      Prior Function Prior Level of Function : Needs assist             Mobility Comments: using rolling walker. usually has at least supervision assistance in the home ADLs Comments: assistance/supervision and/or set-up provided as needed but prefers to be as independent as possible. vision impaired     Extremity/Trunk Assessment   Upper Extremity Assessment Upper Extremity Assessment: Overall WFL for tasks assessed    Lower Extremity Assessment Lower Extremity Assessment: LLE deficits/detail LLE Deficits / Details: pain with movement of hip. can activate hip/knee/ankle movement. patient can accept partial weight without knee buckling       Communication   Communication Communication: No apparent difficulties    Cognition Arousal: Alert Behavior During Therapy: WFL for tasks assessed/performed   PT - Cognitive impairments: Memory, Orientation   Orientation impairments: Place                   PT - Cognition Comments: patient thinks she is at home. some decreased recall about recent events Following commands: Impaired Following commands impaired: Follows one step commands with increased time (multi modal cues partially due to vision impairment)     Cueing Cueing Techniques: Verbal cues, Tactile cues     General Comments General comments (skin integrity, edema, etc.): supportive son that the bedside is requesting rehab placement at discharge.    Exercises     Assessment/Plan    PT Assessment Patient needs continued PT services  PT Problem List Decreased strength;Decreased range of motion;Decreased activity tolerance;Decreased balance;Decreased mobility;Decreased  cognition;Decreased knowledge of use of DME;Decreased safety awareness;Pain       PT Treatment Interventions DME instruction;Gait training;Stair training;Functional mobility training;Therapeutic activities;Therapeutic exercise;Balance training;Neuromuscular re-education;Cognitive remediation;Patient/family education    PT Goals (Current goals can be found in the Care Plan section)  Acute Rehab PT Goals Patient Stated Goal: to give her family a break, rehab PT Goal Formulation: With patient/family Time For Goal Achievement: 06/18/24 Potential to Achieve Goals: Fair    Frequency Min 2X/week     Co-evaluation               AM-PAC PT 6 Clicks Mobility  Outcome Measure Help needed turning from your back to your side while in a flat bed without using bedrails?: A Lot Help needed moving from lying on your back to sitting on the side of a flat bed without using bedrails?: A Lot Help needed moving to and from a bed to a chair (including a wheelchair)?: A Lot Help needed standing up from a chair using your arms (e.g., wheelchair or bedside chair)?: A Lot Help needed to walk in hospital room?: A Lot Help needed climbing 3-5 steps with a railing? : Total 6 Click Score: 11    End of Session   Activity Tolerance: Patient tolerated treatment well Patient left: in bed;with call bell/phone within reach;with bed alarm set;with family/visitor present Nurse Communication: Mobility status PT Visit Diagnosis:  Difficulty in walking, not elsewhere classified (R26.2);Pain Pain - Right/Left: Left Pain - part of body: Hip    Time: 0926-1004 PT Time Calculation (min) (ACUTE ONLY): 38 min   Charges:   PT Evaluation $PT Eval Moderate Complexity: 1 Mod PT Treatments $Therapeutic Activity: 8-22 mins PT General Charges $$ ACUTE PT VISIT: 1 Visit         Ozie Bo, PT, MPT   Erlene Hawks 06/04/2024, 12:33 PM

## 2024-06-05 ENCOUNTER — Encounter (HOSPITAL_COMMUNITY): Payer: Self-pay | Admitting: Orthopedic Surgery

## 2024-06-05 DIAGNOSIS — S72012D Unspecified intracapsular fracture of left femur, subsequent encounter for closed fracture with routine healing: Secondary | ICD-10-CM | POA: Diagnosis not present

## 2024-06-05 LAB — CBC
HCT: 23.9 % — ABNORMAL LOW (ref 36.0–46.0)
Hemoglobin: 7.7 g/dL — ABNORMAL LOW (ref 12.0–15.0)
MCH: 26.6 pg (ref 26.0–34.0)
MCHC: 32.2 g/dL (ref 30.0–36.0)
MCV: 82.7 fL (ref 80.0–100.0)
Platelets: 86 10*3/uL — ABNORMAL LOW (ref 150–400)
RBC: 2.89 MIL/uL — ABNORMAL LOW (ref 3.87–5.11)
RDW: 16.6 % — ABNORMAL HIGH (ref 11.5–15.5)
WBC: 7.4 10*3/uL (ref 4.0–10.5)
nRBC: 0 % (ref 0.0–0.2)

## 2024-06-05 LAB — BASIC METABOLIC PANEL WITH GFR
Anion gap: 7 (ref 5–15)
BUN: 23 mg/dL (ref 8–23)
CO2: 23 mmol/L (ref 22–32)
Calcium: 8 mg/dL — ABNORMAL LOW (ref 8.9–10.3)
Chloride: 108 mmol/L (ref 98–111)
Creatinine, Ser: 1.57 mg/dL — ABNORMAL HIGH (ref 0.44–1.00)
GFR, Estimated: 31 mL/min — ABNORMAL LOW (ref >60)
Glucose, Bld: 92 mg/dL (ref 70–99)
Potassium: 3.8 mmol/L (ref 3.5–5.1)
Sodium: 138 mmol/L (ref 135–145)

## 2024-06-05 LAB — GLUCOSE, CAPILLARY
Glucose-Capillary: 79 mg/dL (ref 70–99)
Glucose-Capillary: 170 mg/dL — ABNORMAL HIGH (ref 70–99)
Glucose-Capillary: 85 mg/dL (ref 70–99)
Glucose-Capillary: 159 mg/dL — ABNORMAL HIGH (ref 70–99)

## 2024-06-05 LAB — IRON AND TIBC
Iron: 9 ug/dL — ABNORMAL LOW (ref 28–170)
Saturation Ratios: 4 % — ABNORMAL LOW (ref 10.4–31.8)
TIBC: 245 ug/dL — ABNORMAL LOW (ref 250–450)
UIBC: 236 ug/dL

## 2024-06-05 LAB — FERRITIN: Ferritin: 115 ng/mL (ref 11–307)

## 2024-06-05 LAB — VITAMIN D 25 HYDROXY (VIT D DEFICIENCY, FRACTURES): Vit D, 25-Hydroxy: 26.92 ng/mL — ABNORMAL LOW (ref 30–100)

## 2024-06-05 MED ORDER — ENSURE PLUS HIGH PROTEIN PO LIQD
237.0000 mL | Freq: Two times a day (BID) | ORAL | Status: DC
  Administered 2024-06-05 – 2024-06-06 (×3): 237 mL via ORAL

## 2024-06-05 MED ORDER — LACTULOSE 10 GM/15ML PO SOLN
20.0000 g | Freq: Once | ORAL | Status: AC
  Administered 2024-06-05: 20 g via ORAL
  Filled 2024-06-05: qty 30

## 2024-06-05 MED ORDER — IRON SUCROSE 500 MG IVPB - SIMPLE MED
500.0000 mg | INTRAVENOUS | Status: DC
  Filled 2024-06-05: qty 275

## 2024-06-05 MED ORDER — SENNA 8.6 MG PO TABS
1.0000 | ORAL_TABLET | Freq: Every day | ORAL | Status: DC
  Filled 2024-06-05: qty 1

## 2024-06-05 MED ORDER — SODIUM CHLORIDE 0.9 % IV SOLN
500.0000 mg | INTRAVENOUS | Status: AC
  Administered 2024-06-05: 500 mg via INTRAVENOUS
  Filled 2024-06-05: qty 25

## 2024-06-05 MED ORDER — DEXTROSE-SODIUM CHLORIDE 5-0.45 % IV SOLN: INTRAVENOUS | Status: AC

## 2024-06-05 MED ORDER — FERROUS SULFATE 325 (65 FE) MG PO TABS
325.0000 mg | ORAL_TABLET | Freq: Every day | ORAL | Status: DC
  Administered 2024-06-06: 325 mg via ORAL
  Filled 2024-06-05 (×2): qty 1

## 2024-06-05 NOTE — Progress Notes (Signed)
 Physical Therapy Treatment Patient Details Name: Breanna [name] MRN: [id number] DOB: 1931-01-30 Today's Date: 06/05/2024   History of Present Illness Patient is a 89 year old female with fall while ambulating. Found to have mildly displaced left femoral neck fracture s/p left hip hemiarthroplasty. PMH: HTN, CKD 3, anemia, CVA, GERD, gout, DM, depression, parkinsonian features, glaucoma, blindness, [name].    PT Comments  Pt received in supine and agreeable to session with daughter present and supportive throughout. Pt reports less pain today and demonstrates improved mobility tolerance this session. Pt able to tolerate gait trial with a chair follow for safety. Pt requires increased cues and min A due to vision impairment. Pt continues to be limited by L hip pain and impaired activity tolerance. Pt continues to benefit from PT services to progress toward functional mobility goals.     If plan is discharge home, recommend the following: A lot of help with walking and/or transfers;A lot of help with bathing/dressing/bathroom;Help with stairs or ramp for entrance;Assistance with cooking/housework   Can travel by private vehicle     No  Equipment Recommendations  BSC/3in1    Recommendations for Other Services       Precautions / Restrictions Precautions Precautions: Fall Recall of Precautions/Restrictions: Intact Restrictions Weight Bearing Restrictions Per Provider Order: Yes LLE Weight Bearing Per Provider Order: Weight bearing as tolerated     Mobility  Bed Mobility Overal bed mobility: Needs Assistance Bed Mobility: Supine to Sit     Supine to sit: Min assist, HOB elevated, Used rails     General bed mobility comments: cues for sequencing and technique and min A for trunk elevation and LLE advancement to EOB    Transfers Overall transfer level: Needs assistance Equipment used: Rolling walker (2 wheels) Transfers: Sit to/from Stand Sit to Stand: Min  assist           General transfer comment: light min A from EOB. cues for hand placement    Ambulation/Gait Ambulation/Gait assistance: +2 safety/equipment, Min assist Gait Distance (Feet): 50 Feet Assistive device: Rolling walker (2 wheels) Gait Pattern/deviations: Drifts right/left, Narrow base of support, Step-through pattern, Decreased stride length, Trunk flexed, Antalgic       General Gait Details: Pt demonstrates slow step-through pattern with drift to the L requiring intermittent min A and cues for RW management due to vision impairment. cues for upright posture and chair follow for safety.   Stairs             Wheelchair Mobility     Tilt Bed    Modified Rankin (Stroke Patients Only)       Balance Overall balance assessment: Needs assistance Sitting-balance support: Feet supported, Bilateral upper extremity supported Sitting balance-Leahy Scale: Fair Sitting balance - Comments: sitting EOB   Standing balance support: Bilateral upper extremity supported, Reliant on assistive device for balance, During functional activity Standing balance-Leahy Scale: Poor Standing balance comment: with RW support                            Communication Communication Communication: No apparent difficulties  Cognition Arousal: Alert Behavior During Therapy: WFL for tasks assessed/performed   PT - Cognitive impairments: Memory, Orientation                         Following commands: Impaired Following commands impaired: Follows one step commands with increased time    Cueing Cueing Techniques: Verbal  cues, Tactile cues  Exercises      General Comments General comments (skin integrity, edema, etc.): daughter present and supportive throughout session      Pertinent Vitals/Pain Pain Assessment Pain Assessment: Faces Faces Pain Scale: Hurts little more Pain Location: L hip with movement Pain Descriptors / Indicators: Discomfort, Aching,  Grimacing, Guarding Pain Intervention(s): Limited activity within patient's tolerance, Monitored during session, Repositioned     PT Goals (current goals can now be found in the care plan section) Acute Rehab PT Goals Patient Stated Goal: to give her family a break, rehab PT Goal Formulation: With patient/family Time For Goal Achievement: 06/18/24 Progress towards PT goals: Progressing toward goals    Frequency    Min 2X/week       AM-PAC PT 6 Clicks Mobility   Outcome Measure  Help needed turning from your back to your side while in a flat bed without using bedrails?: A Little Help needed moving from lying on your back to sitting on the side of a flat bed without using bedrails?: A Little Help needed moving to and from a bed to a chair (including a wheelchair)?: A Little Help needed standing up from a chair using your arms (e.g., wheelchair or bedside chair)?: A Little Help needed to walk in hospital room?: A Little Help needed climbing 3-5 steps with a railing? : A Lot 6 Click Score: 17    End of Session Equipment Utilized During Treatment: Gait belt Activity Tolerance: Patient tolerated treatment well Patient left: in chair;with family/visitor present;with call bell/phone within reach Nurse Communication: Mobility status;Other (comment) (no chair alarm, but family in the room) PT Visit Diagnosis: Difficulty in walking, not elsewhere classified (R26.2);Pain     Time: 0835-0906 PT Time Calculation (min) (ACUTE ONLY): 31 min  Charges:    $Gait Training: 8-22 mins $Therapeutic Activity: 8-22 mins PT General Charges $$ ACUTE PT VISIT: 1 Visit                     Michaelle Adolphus, PTA Acute Rehabilitation Services Secure Chat Preferred  [phone]    Michaelle Adolphus 06/05/2024, 10:26 AM

## 2024-06-05 NOTE — Progress Notes (Signed)
 Progress Note   Patient: Breanna [name] NFA:[id number] DOB: [birth year] DOA: 06/02/2024     3 DOS: the patient was seen and examined on 06/05/2024   Brief hospital course: Per H&P HPI HPI: Breanna [name] is a 89 y.o. female with medical history significant of hypertension, hyperlipidemia, CKD 3B, anemia, CVA, GERD, gout, diabetes, depression, parkinsonian features, glaucoma, blindness, Breanna [name] syndrome presenting after fall at home.   Patient was reportedly heading to the bathroom and reaching out for the commode when she slipped and fell.  She landed on her left side and did hit her head on the ground.  Did briefly lose consciousness.  Afterwards reported left hip pain and has not ambulated since.  Contusion to her head and also noted some mild knee swelling.  EMS reported heart rate in the 40s.   Denies fevers, chills, chest pain, shortness of breath, abdominal pain, constipation, diarrhea, nausea, vomiting.  Assessment and Plan: L hip fracture  After mechanical fall.  S/p OR with orthopedics 6/11 Pain control APAP and low dose oxycodone  for severe pain    AKI on CKD3a Baseline around 1     Latest Ref Rng & Units 06/05/2024    6:46 AM 06/04/2024    1:37 PM 06/03/2024    5:40 AM  BMP  Glucose 70 - 99 mg/dL 92  469  88   BUN 8 - 23 mg/dL 23  21  24    Creatinine 0.44 - 1.00 mg/dL 6.29  5.28  4.13   Sodium 135 - 145 mmol/L 138  135  137   Potassium 3.5 - 5.1 mmol/L 3.8  3.9  3.8   Chloride 98 - 111 mmol/L 108  103  107   CO2 22 - 32 mmol/L 23  22  21    Calcium  8.9 - 10.3 mg/dL 8.0  7.9  8.6    Improving. With IV fluids.  Continue to monitor   HTN Continue amlodipine  Holding torsemide , spiro, and losartan  in the setting of AKI  Continue home coreg  at lower dose, will check to see if HR responds appropriately to activity.   HLD Noted On statin at home.   Normocytic anemia  Iron  deficiency  Iron /TIBC/Ferritin/ %Sat    Component Value Date/Time   IRON  9 (L)  06/05/2024 0646   TIBC 245 (L) 06/05/2024 0646   FERRITIN 115 06/05/2024 0646   IRONPCTSAT 4 (L) 06/05/2024 0646   IRONPCTSAT 14 (L) 04/16/2024 1438   Continue IV and PO supplementation with iron  .      Latest Ref Rng & Units 06/05/2024    6:46 AM 06/04/2024    1:37 PM 06/03/2024    5:40 AM  CBC  WBC 4.0 - 10.5 K/uL 7.4  8.2  6.6   Hemoglobin 12.0 - 15.0 g/dL 7.7  8.1  9.0   Hematocrit 36.0 - 46.0 % 23.9  25.0  28.2   Platelets 150 - 400 K/uL 86  86  102    Check b12/folate    Thrombocytopenia Noted to have clumps on previous  CBC. 86K this AM.  Will continue to monitor.   H/o CVA Resume ASA  Statin   H/o gout Hold allopurinol  in setting of AKI   T2DM Well controlld. A1c 6.2. Holding home metformin   SSI and will transition to basal bolus regimen while hospitalized  CBG (last 3)  Recent Labs    06/05/24 0633 06/05/24 1119 06/05/24 1606  GLUCAP 79 170* 85    Depression Home sertraline   GERD  Noted.   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  Has syndrome which she has hallucinations due to her vision loss that she knows a       Subjective: Pain is well controlled.   Physical Exam: Vitals:   06/04/24 1513 06/04/24 1917 06/05/24 0410 06/05/24 0712  BP: (!) 119/58 (!) 139/59 (!) 146/66 (!) 142/77  Pulse: (!) 50 (!) 53 (!) 50   Resp: 19 18 18 15   Temp: (!) 97.5 F (36.4 C) 98.2 F (36.8 C) 98 F (36.7 C) 98.2 F (36.8 C)  TempSrc:    Oral  SpO2: 98% 97% 100% 98%  Weight:      Height:        Physical Exam  Constitutional: In no distress.  Cardiovascular: Normal rate, regular rhythm. No lower extremity edema  Pulmonary: Non labored breathing on room air, no wheezing or rales.   Abdominal: Soft. Normal bowel sounds. Non distended and non tender Musculoskeletal: Normal range of motion.     Neurological: Alert and oriented to person, place, and time. Non focal  Skin: Skin is warm and dry. Minimal swelling  surrounding incision. No surrounding erythema.    Data Reviewed:     Latest Ref Rng & Units 06/05/2024    6:46 AM 06/04/2024    1:37 PM 06/03/2024    5:40 AM  BMP  Glucose 70 - 99 mg/dL 92  147  88   BUN 8 - 23 mg/dL 23  21  24    Creatinine 0.44 - 1.00 mg/dL 8.29  5.62  1.30   Sodium 135 - 145 mmol/L 138  135  137   Potassium 3.5 - 5.1 mmol/L 3.8  3.9  3.8   Chloride 98 - 111 mmol/L 108  103  107   CO2 22 - 32 mmol/L 23  22  21    Calcium  8.9 - 10.3 mg/dL 8.0  7.9  8.6      Family Communication: Son at bedside   Disposition: Status is: Inpatient Remains inpatient appropriate because: Recent hip fracture and OR   Planned Discharge Destination: Skilled nursing facility    Time spent: 35 minutes  Author: Joette Mustard, MD 06/05/2024 1:40 PM  For on call review [email or web address].

## 2024-06-05 NOTE — Plan of Care (Signed)
  Problem: Education: Goal: Knowledge of General Education information will improve Description: Including pain rating scale, medication(s)/side effects and non-pharmacologic comfort measures Outcome: Progressing   Problem: Skin Integrity: Goal: Risk for impaired skin integrity will decrease Outcome: Progressing   Problem: Pain Managment: Goal: General experience of comfort will improve and/or be controlled Outcome: Progressing

## 2024-06-05 NOTE — Progress Notes (Signed)
     Subjective: Patient reports pain as mild.  Was able to stand with PT but not take steps. Daughter at bedside. Hgb 8.1 yesterday down from 9.0 preop. Plan for discharge to SNF from the hospital.  Objective:   VITALS:   Vitals:   06/04/24 0755 06/04/24 1513 06/04/24 1917 06/05/24 0410  BP: (!) 175/74 (!) 119/58 (!) 139/59 (!) 146/66  Pulse: 62 (!) 50 (!) 53 (!) 50  Resp:  19 18 18   Temp: 98.1 F (36.7 C) (!) 97.5 F (36.4 C) 98.2 F (36.8 C) 98 F (36.7 C)  TempSrc:      SpO2:  98% 97% 100%  Weight:      Height:        Sensation intact distally Intact pulses distally Dorsiflexion/Plantar flexion intact Incision: dressing C/D/I Compartment soft    Lab Results  Component Value Date   WBC 8.2 06/04/2024   HGB 8.1 (L) 06/04/2024   HCT 25.0 (L) 06/04/2024   MCV 83.6 06/04/2024   PLT 86 (L) 06/04/2024   BMET    Component Value Date/Time   NA 135 06/04/2024 1337   K 3.9 06/04/2024 1337   CL 103 06/04/2024 1337   CO2 22 06/04/2024 1337   GLUCOSE 213 (H) 06/04/2024 1337   GLUCOSE 87 01/06/2007 1521   BUN 21 06/04/2024 1337   CREATININE 1.62 (H) 06/04/2024 1337   CREATININE 1.35 (H) 08/18/2020 1430   CALCIUM  7.9 (L) 06/04/2024 1337   GFRNONAA 30 (L) 06/04/2024 1337   GFRNONAA 35 (L) 08/18/2020 1430      Xray: cemented hemi components in good position, no adverse features  Assessment/Plan: 2 Days Post-Op   Principal Problem:   Closed subcapital fracture of neck of femur, left, initial encounter (HCC) Active Problems:   DM2 (diabetes mellitus, type 2) (HCC)   Gout   HTN (hypertension)   History of cardioembolic cerebrovascular accident (CVA)   GERD (gastroesophageal reflux disease)   Primary open angle glaucoma of both eyes, indeterminate stage   Normocytic anemia   Hyperlipidemia   Parkinsonian features   Stage 3b chronic kidney disease (CKD) (HCC)   Depression   Charles Bonnet syndrome   Blindness of both eyes  S/p Left hip hemi for femoral  neck fx 06/04/24  Post op recs: WB: WBAT   Abx: ancef  x23 hours post op Imaging: PACU xrays Dressing: Aquacel dressing to be kept intact until follow-up DVT prophylaxis: ASA 81 mg twice daily x 4 weeks Follow up: 2 weeks after surgery for a wound check with Dr. Pryor Browning at [facility].  Address: [address], [place], [place] [address]  Office Phone: [phone]   Murleen Arms 06/05/2024, 6:46 AM   Priscille Brought, MD  Contact information:   [phone] 7am-5pm epic message Dr. Pryor Browning, or call office for patient follow up: [phone] After hours and holidays please check [email or web address] for group call information for [facility]

## 2024-06-05 NOTE — Care Management Important Message (Signed)
 Important Message  Patient Details  Name: Breanna [name] MRN: [id number] Date of Birth: Feb 21, 1931   Important Message Given:  Yes - Medicare IM     Felix Host 06/05/2024, 11:50 AM

## 2024-06-05 NOTE — TOC Progression Note (Addendum)
 Transition of Care [facility]) - Progression Note    Patient Details  Name: Breanna [name] MRN: [id number] Date of Birth: [birth year]  [facility] of [facility] CM/SW Contact  Elspeth Hals, LCSW Phone Number: 06/05/2024, 10:09 AM  Clinical Narrative:   Bed offers provided to pt and son Debborah Fairly.  He is requesting response from [facility].  CSW reached out to that facility.   1100: [facility] does offer bed, CSW confirmed with pt and son: they do want to accept this bed offer.   1345: Per MD, no DC today.  CSW confirmed with [facility] that they can receive pt over the weekend.  Contact would be April Irby, RN at [facility].    Auth request submitted in [facility] and approved: [id number], 4 days: 6/14-6/17.  Expected Discharge Plan: Skilled Nursing Facility Barriers to Discharge: Continued Medical Work up, SNF Pending bed offer  Expected Discharge Plan and Services In-house Referral: Clinical Social Work   Post Acute Care Choice: Skilled Nursing Facility Living arrangements for the past 2 months: Single Family Home                                       Social Determinants of Health (SDOH) Interventions SDOH Screenings   Food Insecurity: No Food Insecurity (06/03/2024)  Housing: Low Risk  (06/03/2024)  Transportation Needs: No Transportation Needs (06/03/2024)  Utilities: Not At Risk (06/03/2024)  Alcohol  Screen: Low Risk  (04/23/2024)  Depression (PHQ2-9): Low Risk  (05/22/2024)  Financial Resource Strain: Low Risk  (02/12/2024)  Physical Activity: Inactive (02/12/2024)  Social Connections: Moderately Integrated (06/03/2024)  Stress: No Stress Concern Present (04/23/2024)  Tobacco Use: Low Risk  (06/04/2024)  Health Literacy: Inadequate Health Literacy (12/06/2023)    Readmission Risk Interventions    01/27/2024    1:14 PM 01/20/2024    3:32 PM  Readmission Risk Prevention Plan  Transportation Screening Complete Complete  PCP or Specialist Appt within 3-5 Days Complete Complete  HRI or  Home Care Consult Complete Complete  Social Work Consult for Recovery Care Planning/Counseling Complete Complete  Palliative Care Screening Not Applicable Not Applicable  Medication Review Oceanographer) Complete Complete

## 2024-06-06 DIAGNOSIS — Z7401 Bed confinement status: Secondary | ICD-10-CM | POA: Diagnosis not present

## 2024-06-06 DIAGNOSIS — S72012A Unspecified intracapsular fracture of left femur, initial encounter for closed fracture: Secondary | ICD-10-CM | POA: Diagnosis not present

## 2024-06-06 DIAGNOSIS — S72042D Displaced fracture of base of neck of left femur, subsequent encounter for closed fracture with routine healing: Secondary | ICD-10-CM | POA: Diagnosis not present

## 2024-06-06 DIAGNOSIS — Z8673 Personal history of transient ischemic attack (TIA), and cerebral infarction without residual deficits: Secondary | ICD-10-CM | POA: Diagnosis not present

## 2024-06-06 DIAGNOSIS — S72012D Unspecified intracapsular fracture of left femur, subsequent encounter for closed fracture with routine healing: Secondary | ICD-10-CM | POA: Diagnosis not present

## 2024-06-06 DIAGNOSIS — M109 Gout, unspecified: Secondary | ICD-10-CM | POA: Diagnosis not present

## 2024-06-06 DIAGNOSIS — M25552 Pain in left hip: Secondary | ICD-10-CM | POA: Diagnosis not present

## 2024-06-06 DIAGNOSIS — L988 Other specified disorders of the skin and subcutaneous tissue: Secondary | ICD-10-CM | POA: Diagnosis not present

## 2024-06-06 DIAGNOSIS — N1831 Chronic kidney disease, stage 3a: Secondary | ICD-10-CM | POA: Insufficient documentation

## 2024-06-06 DIAGNOSIS — Z471 Aftercare following joint replacement surgery: Secondary | ICD-10-CM | POA: Diagnosis not present

## 2024-06-06 DIAGNOSIS — Z743 Need for continuous supervision: Secondary | ICD-10-CM | POA: Diagnosis not present

## 2024-06-06 DIAGNOSIS — E785 Hyperlipidemia, unspecified: Secondary | ICD-10-CM | POA: Diagnosis not present

## 2024-06-06 DIAGNOSIS — E1140 Type 2 diabetes mellitus with diabetic neuropathy, unspecified: Secondary | ICD-10-CM | POA: Diagnosis not present

## 2024-06-06 DIAGNOSIS — Z96642 Presence of left artificial hip joint: Secondary | ICD-10-CM | POA: Diagnosis not present

## 2024-06-06 DIAGNOSIS — I499 Cardiac arrhythmia, unspecified: Secondary | ICD-10-CM | POA: Diagnosis not present

## 2024-06-06 DIAGNOSIS — I10 Essential (primary) hypertension: Secondary | ICD-10-CM | POA: Diagnosis not present

## 2024-06-06 DIAGNOSIS — E119 Type 2 diabetes mellitus without complications: Secondary | ICD-10-CM | POA: Diagnosis not present

## 2024-06-06 DIAGNOSIS — L97509 Non-pressure chronic ulcer of other part of unspecified foot with unspecified severity: Secondary | ICD-10-CM | POA: Diagnosis not present

## 2024-06-06 DIAGNOSIS — K219 Gastro-esophageal reflux disease without esophagitis: Secondary | ICD-10-CM | POA: Diagnosis not present

## 2024-06-06 LAB — CBC
HCT: 23.2 % — ABNORMAL LOW (ref 36.0–46.0)
Hemoglobin: 7.6 g/dL — ABNORMAL LOW (ref 12.0–15.0)
MCH: 27 pg (ref 26.0–34.0)
MCHC: 32.8 g/dL (ref 30.0–36.0)
MCV: 82.6 fL (ref 80.0–100.0)
Platelets: 103 10*3/uL — ABNORMAL LOW (ref 150–400)
RBC: 2.81 MIL/uL — ABNORMAL LOW (ref 3.87–5.11)
RDW: 16.6 % — ABNORMAL HIGH (ref 11.5–15.5)
WBC: 7.8 10*3/uL (ref 4.0–10.5)
nRBC: 0 % (ref 0.0–0.2)

## 2024-06-06 LAB — GLUCOSE, CAPILLARY
Glucose-Capillary: 111 mg/dL — ABNORMAL HIGH (ref 70–99)
Glucose-Capillary: 145 mg/dL — ABNORMAL HIGH (ref 70–99)
Glucose-Capillary: 98 mg/dL (ref 70–99)

## 2024-06-06 LAB — BASIC METABOLIC PANEL WITH GFR
Anion gap: 7 (ref 5–15)
BUN: 24 mg/dL — ABNORMAL HIGH (ref 8–23)
CO2: 23 mmol/L (ref 22–32)
Calcium: 8.4 mg/dL — ABNORMAL LOW (ref 8.9–10.3)
Chloride: 108 mmol/L (ref 98–111)
Creatinine, Ser: 1.43 mg/dL — ABNORMAL HIGH (ref 0.44–1.00)
GFR, Estimated: 35 mL/min — ABNORMAL LOW (ref >60)
Glucose, Bld: 117 mg/dL — ABNORMAL HIGH (ref 70–99)
Potassium: 3.6 mmol/L (ref 3.5–5.1)
Sodium: 138 mmol/L (ref 135–145)

## 2024-06-06 LAB — VITAMIN B12: Vitamin B-12: 2202 pg/mL — ABNORMAL HIGH (ref 180–914)

## 2024-06-06 LAB — PHOSPHORUS: Phosphorus: 3.4 mg/dL (ref 2.5–4.6)

## 2024-06-06 LAB — MAGNESIUM: Magnesium: 2 mg/dL (ref 1.7–2.4)

## 2024-06-06 LAB — FOLATE: Folate: 33 ng/mL (ref >5.9)

## 2024-06-06 MED ORDER — GABAPENTIN 100 MG PO CAPS: 100.0000 mg | ORAL_CAPSULE | Freq: Every day | ORAL | Status: DC

## 2024-06-06 MED ORDER — SENNA 8.6 MG PO TABS: 1.0000 | ORAL_TABLET | Freq: Every day | ORAL | Status: AC

## 2024-06-06 MED ORDER — ACETAMINOPHEN 325 MG PO TABS: 650.0000 mg | ORAL_TABLET | ORAL | Status: AC

## 2024-06-06 MED ORDER — CARVEDILOL 25 MG PO TABS: 12.5000 mg | ORAL_TABLET | Freq: Two times a day (BID) | ORAL | Status: DC

## 2024-06-06 MED ORDER — ALLOPURINOL 100 MG PO TABS: 50.0000 mg | ORAL_TABLET | ORAL | Status: AC

## 2024-06-06 MED ORDER — CHOLECALCIFEROL 10 MCG (400 UNIT) PO TABS
600.0000 [IU] | ORAL_TABLET | Freq: Every day | ORAL | Status: DC
  Filled 2024-06-06: qty 2

## 2024-06-06 MED ORDER — FERROUS SULFATE 325 (65 FE) MG PO TABS: 325.0000 mg | ORAL_TABLET | Freq: Every day | ORAL | Status: AC

## 2024-06-06 MED ORDER — VITAMIN D 25 MCG (1000 UNIT) PO TABS
1000.0000 [IU] | ORAL_TABLET | Freq: Every day | ORAL | Status: DC
  Administered 2024-06-06: 1000 [IU] via ORAL
  Filled 2024-06-06: qty 1

## 2024-06-06 MED ORDER — ENSURE PLUS HIGH PROTEIN PO LIQD
237.0000 mL | Freq: Two times a day (BID) | ORAL | Status: AC
Start: 2024-06-06 — End: ?

## 2024-06-06 MED ORDER — POLYETHYLENE GLYCOL 3350 17 G PO PACK: 17.0000 g | PACK | Freq: Every day | ORAL | Status: AC | PRN

## 2024-06-06 MED ORDER — OXYCODONE HCL 5 MG PO TABS: 2.5000 mg | ORAL_TABLET | Freq: Four times a day (QID) | ORAL | 0 refills | Status: AC | PRN

## 2024-06-06 MED ORDER — POLYVINYL ALCOHOL 1.4 % OP SOLN: 1.0000 [drp] | OPHTHALMIC | Status: AC | PRN

## 2024-06-06 MED ORDER — ASPIRIN 81 MG PO TBEC: 81.0000 mg | DELAYED_RELEASE_TABLET | Freq: Two times a day (BID) | ORAL | Status: AC

## 2024-06-06 MED ORDER — ONDANSETRON 4 MG PO TBDP
4.0000 mg | ORAL_TABLET | Freq: Three times a day (TID) | ORAL | Status: AC | PRN
Start: 2024-06-06 — End: ?

## 2024-06-06 MED ORDER — SODIUM CHLORIDE 0.9% IV SOLUTION: Freq: Once | INTRAVENOUS | Status: AC

## 2024-06-06 MED ORDER — VITAMIN D3 25 MCG PO TABS: 1000.0000 [IU] | ORAL_TABLET | Freq: Every day | ORAL | Status: AC

## 2024-06-06 NOTE — Plan of Care (Signed)
   Problem: Elimination: Goal: Will not experience complications related to bowel motility Outcome: Progressing   Problem: Safety: Goal: Ability to remain free from injury will improve Outcome: Progressing   Problem: Skin Integrity: Goal: Risk for impaired skin integrity will decrease Outcome: Progressing

## 2024-06-06 NOTE — Progress Notes (Signed)
 Report given to [facility]. All questions were answered.

## 2024-06-06 NOTE — TOC Progression Note (Signed)
 Transition of Care [facility]) - Progression Note    Patient Details  Name: Breanna [name] MRN: [id number] Date of Birth: 07-May-1931  Transition of Care [facility]) CM/SW Contact  Yosef Krogh, [place], [place] Phone Number: 06/06/2024, 1:26 PM  Clinical Narrative:    Patient to discharge to [facility].  Patient will be going to room 205 . RN to call report to [phone]. Patient to be transported by [facility]. Patient's son notified of discharge today.  Sevannah Madia, LCSW Transition of Care     Expected Discharge Plan: Skilled Nursing Facility Barriers to Discharge: Continued Medical Work up, SNF Pending bed offer  Expected Discharge Plan and Services In-house Referral: Clinical Social Work   Post Acute Care Choice: Skilled Nursing Facility Living arrangements for the past 2 months: Single Family Home Expected Discharge Date: 06/06/24                                     Social Determinants of Health (SDOH) Interventions SDOH Screenings   Food Insecurity: No Food Insecurity (06/03/2024)  Housing: Low Risk  (06/03/2024)  Transportation Needs: No Transportation Needs (06/03/2024)  Utilities: Not At Risk (06/03/2024)  Alcohol  Screen: Low Risk  (04/23/2024)  Depression (PHQ2-9): Low Risk  (05/22/2024)  Financial Resource Strain: Low Risk  (02/12/2024)  Physical Activity: Inactive (02/12/2024)  Social Connections: Moderately Integrated (06/03/2024)  Stress: No Stress Concern Present (04/23/2024)  Tobacco Use: Low Risk  (06/04/2024)  Health Literacy: Inadequate Health Literacy (12/06/2023)    Readmission Risk Interventions    01/27/2024    1:14 PM 01/20/2024    3:32 PM  Readmission Risk Prevention Plan  Transportation Screening Complete Complete  PCP or Specialist Appt within 3-5 Days Complete Complete  HRI or Home Care Consult Complete Complete  Social Work Consult for Recovery Care Planning/Counseling Complete Complete  Palliative Care Screening Not Applicable Not  Applicable  Medication Review Oceanographer) Complete Complete

## 2024-06-06 NOTE — Discharge Summary (Signed)
 Physician Discharge Summary   Patient: Breanna [name] MRN: [id number] DOB: 02/04/1931  Admit date:     06/02/2024  Discharge date: 06/06/24  Discharge Physician: Joette Mustard   PCP: Genia Kettering, MD   Recommendations at discharge:    At PCP office will need BMP to ensure kidney function continues to improve. Starting oral iron  will need repeat CBC in 2 weeks.  Started on vitamin D  will need to have vitamin D  levels checked in 4 months to ensure normalized.  Spironolactone , torsemide , and losartan  held due to kidney injury will need to see PCP in 1 week to decide whether to resume these. Metformin  also held due to kidney injury and allopurinol  dose reduced.   Discharge Diagnoses: Principal Problem:   Closed subcapital fracture of neck of femur, left, initial encounter (HCC) Active Problems:   DM2 (diabetes mellitus, type 2) (HCC)   Gout   HTN (hypertension)   History of cardioembolic cerebrovascular accident (CVA)   GERD (gastroesophageal reflux disease)   Primary open angle glaucoma of both eyes, indeterminate stage   Normocytic anemia   Hyperlipidemia   Parkinsonian features   Depression   Breanna [name] syndrome   Blindness of both eyes   Stage 3a chronic kidney disease (CKD) (HCC)  Resolved Problems:   * No resolved hospital problems. *  Hospital Course: Per H&P HPI HPI: Breanna [name] is a 89 y.o. female with medical history significant of hypertension, hyperlipidemia, CKD 3B, anemia, CVA, GERD, gout, diabetes, depression, parkinsonian features, glaucoma, blindness, Breanna [name] syndrome presenting after fall at home.   Patient was reportedly heading to the bathroom and reaching out for the commode when she slipped and fell.  She landed on her left side and did hit her head on the ground.  Did briefly lose consciousness.  Afterwards reported left hip pain and has not ambulated since.  Contusion to her head and also noted some mild knee swelling.  EMS  reported heart rate in the 40s.   Denies fevers, chills, chest pain, shortness of breath, abdominal pain, constipation, diarrhea, nausea, vomiting.  Assessment and Plan: L hip fracture  After mechanical fall.  S/p OR with orthopedics 6/11 for cemented L hip hemiarthroplasty  Pain control APAP and low dose oxycodone  for severe pain  Home gabapentin  changed to at night only  Will need 28 days of ASA 81mg  twice a day .   AKI on CKD3a    Latest Ref Rng & Units 06/06/2024    6:10 AM 06/05/2024    6:46 AM 06/04/2024    1:37 PM  BMP  Glucose 70 - 99 mg/dL 696  92  295   BUN 8 - 23 mg/dL 24  23  21    Creatinine 0.44 - 1.00 mg/dL 2.84  1.32  4.40   Sodium 135 - 145 mmol/L 138  138  135   Potassium 3.5 - 5.1 mmol/L 3.6  3.8  3.9   Chloride 98 - 111 mmol/L 108  108  103   CO2 22 - 32 mmol/L 23  23  22    Calcium  8.9 - 10.3 mg/dL 8.4  8.0  7.9    Continues to improve with IV hydration. Encourage patient to eat and drink.  She will get this rechecked in 1 week with her PCP.   HTN Currently on amlodipine  and reduced dose Coreg  (due to heart rate). Home torsemide , losartan , and spironolactone  were held due to AKI.    06/06/2024    8:17 AM 06/06/2024  3:51 AM 06/05/2024    8:12 PM  Vitals with BMI  Systolic 139 144 147  Diastolic 61 60 64  Pulse 55 52 54   Blood pressure on heart rate appropriate for patient's age. Patient will see her PCP in 1 week to decide if these medications should be resumed.   Normocytic anemia Iron  deficiency Iron /TIBC/Ferritin/ %Sat    Component Value Date/Time   IRON  9 (L) 06/05/2024 0646   TIBC 245 (L) 06/05/2024 0646   FERRITIN 115 06/05/2024 0646   IRONPCTSAT 4 (L) 06/05/2024 0646   IRONPCTSAT 14 (L) 04/16/2024 1438      Latest Ref Rng & Units 06/06/2024    6:10 AM 06/05/2024    6:46 AM 06/04/2024    1:37 PM  CBC  WBC 4.0 - 10.5 K/uL 7.8  7.4  8.2   Hemoglobin 12.0 - 15.0 g/dL 7.6  7.7  8.1   Hematocrit 36.0 - 46.0 % 23.2  23.9  25.0    Platelets 150 - 400 K/uL 103  86  86   Hemoglobin slowly downtrending. Patient received 1 dose of IV iron  500 mg this hospitalization Will continue p.o. supplementation and give 1 unit of packed red blood cells. She will need repeat labs with her PCP in 2 weeks  Vitamin D  insufficiency  Vitamin D  level 26.92 Discharge patient on vitamin D  supplementation  Thrombocytopenia Noted to have clumps on previous  CBC. 103k this a.m. Folate and b12 WNL. No intervention warranted.   H/o CVA  On asa and low dose statin.    T2DM  Well controlled. A1c 6.2. Home metformin  held due to AKI.   H/o Gout No concern for acute flare.  Transitioned to reduced dose of 50mg  every other day. She will follow up with her PCP to decide when/if this medication should be further titrated.   Depression Home sertraline    GERD Noted.    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  Has syndrome which she has hallucinations due to her vision loss that she knows are not real.      Pain control - [place]  Controlled Substance Reporting System database was reviewed. and patient was instructed, not to drive, operate heavy machinery, perform activities at heights, swimming or participation in water activities or provide baby-sitting services while on Pain, Sleep and Anxiety Medications; until their outpatient Physician has advised to do so again. Also recommended to not to take more than prescribed Pain, Sleep and Anxiety Medications.  Consultants: Orthopedics  Procedures performed: Left cemented hip hemiarthroplasty   Disposition: Skilled nursing facility Diet recommendation:  Regular diet DISCHARGE MEDICATION: Allergies as of 06/06/2024       Reactions   Verapamil Shortness Of Breath   REACTION: SOB   Aspirin  Itching   But tolerates low dose   Atenolol    REACTION: fatigue   Codeine Itching   Codeine Sulfate    Hydrochlorothiazide    REACTION: gout    Hydrocodone      side effects - hallucinations   Ibuprofen     Upset stomach w/high doses   Lisinopril    REACTION: cough   Onion Other (See Comments)   Dry mouth/ gets sores   Shellfish Allergy Swelling   Patient stated she does not eat shellfish, she swells up on different parts of the body   Statins    Dizzy, falls   Valsartan Itching   Adhesive  [tape] Rash   Other Rash  Medication List     PAUSE taking these medications    [employer] VITAMIN B12 1000 MCG tablet Wait to take this until your doctor or other care provider tells you to start again. Generic drug: cyanocobalamin  TAKE 1 TABLET (1,000 MCG TOTAL) BY MOUTH EVERY DAY   losartan  50 MG tablet Wait to take this until your doctor or other care provider tells you to start again. Commonly known as: COZAAR  TAKE 1 TABLET BY MOUTH EVERY DAY   metFORMIN  500 MG tablet Wait to take this until your doctor or other care provider tells you to start again. Commonly known as: GLUCOPHAGE  Take 500 mg by mouth daily with breakfast.   spironolactone  25 MG tablet Wait to take this until your doctor or other care provider tells you to start again. Commonly known as: ALDACTONE  Take 25 mg by mouth 3 (three) times a week.   torsemide  20 MG tablet Wait to take this until your doctor or other care provider tells you to start again. Commonly known as: DEMADEX  Take 20 mg by mouth daily.       STOP taking these medications    Netarsudil-Latanoprost  0.02-0.005 % Soln   traMADol  50 MG tablet Commonly known as: ULTRAM        TAKE these medications    acetaminophen  325 MG tablet Commonly known as: TYLENOL  Take 2 tablets (650 mg total) by mouth every 4 (four) hours. What changed:  when to take this reasons to take this   allopurinol  100 MG tablet Commonly known as: ZYLOPRIM  Take 0.5 tablets (50 mg total) by mouth every other day. What changed: when to take this   amLODipine  10 MG tablet Commonly known as: NORVASC  Take  1 tablet (10 mg total) by mouth daily.   artificial tears ophthalmic solution Place 1 drop into both eyes as needed for dry eyes.   aspirin  EC 81 MG tablet Take 1 tablet (81 mg total) by mouth 2 (two) times daily for 28 days. Swallow whole. What changed: when to take this   atropine  1 % ophthalmic solution Place 1 drop into both eyes daily.   calcium  carbonate 1500 (600 Ca) MG Tabs tablet Commonly known as: OSCAL Take 1,500 mg by mouth daily.   carvedilol  25 MG tablet Commonly known as: COREG  Take 0.5 tablets (12.5 mg total) by mouth 2 (two) times daily with a meal. What changed: how much to take   docusate sodium  100 MG capsule Commonly known as: COLACE TAKE 1 CAPSULE BY MOUTH TWICE A DAY   dorzolamide -timolol  2-0.5 % ophthalmic solution Commonly known as: COSOPT  Place 1 drop into both eyes 2 (two) times daily.   feeding supplement Liqd Take 237 mLs by mouth 2 (two) times daily between meals.   ferrous sulfate 325 (65 FE) MG tablet Take 1 tablet (325 mg total) by mouth daily with breakfast. Start taking on: June 07, 2024   gabapentin  100 MG capsule Commonly known as: NEURONTIN  Take 1 capsule (100 mg total) by mouth at bedtime. What changed: when to take this   GNP True Metrix Glucose Strips test strip Generic drug: glucose blood Use to check blood sugars twice a day   latanoprost  0.005 % ophthalmic solution Commonly known as: XALATAN  Place 1 drop into both eyes at bedtime.   ondansetron  4 MG disintegrating tablet Commonly known as: ZOFRAN -ODT Take 1 tablet (4 mg total) by mouth every 8 (eight) hours as needed for nausea or vomiting.   ondansetron  4 MG tablet Commonly known as: Zofran  Take  1 tablet (4 mg total) by mouth every 8 (eight) hours as needed for up to 14 days for nausea or vomiting.   oxyCODONE  5 MG immediate release tablet Commonly known as: Oxy IR/ROXICODONE  Take 0.5 tablets (2.5 mg total) by mouth every 6 (six) hours as needed for up to 7 days  for severe pain (pain score 7-10).   pantoprazole  40 MG tablet Commonly known as: PROTONIX  TAKE 1 TABLET BY MOUTH EVERY DAY   polyethylene glycol 17 g packet Commonly known as: MIRALAX  / GLYCOLAX  Take 17 g by mouth daily as needed for mild constipation.   rosuvastatin  5 MG tablet Commonly known as: CRESTOR  Take 5 mg by mouth daily.   senna 8.6 MG Tabs tablet Commonly known as: SENOKOT Take 1 tablet (8.6 mg total) by mouth at bedtime. While taking opioids   sertraline  100 MG tablet Commonly known as: ZOLOFT  Take 1 tablet (100 mg total) by mouth at bedtime.   vitamin D3 25 MCG tablet Commonly known as: CHOLECALCIFEROL  Take 1 tablet (1,000 Units total) by mouth daily. Start taking on: June 07, 2024        Contact information for follow-up providers     Murleen Arms, MD Follow up.   Specialty: Orthopedic Surgery Contact information: [address] [place] [address] [phone]              Contact information for after-discharge care     Destination     [facility], [place] .   Service: Skilled Nursing Contact information: [address] [place]  [phone]                    Discharge Exam: Cleavon Curls Weights   06/02/24 1700 06/03/24 1342  Weight: 48.9 kg 49.4 kg   Physical Exam  Constitutional: In no distress.  Cardiovascular: Normal rate, regular rhythm. No LE edema  Pulmonary: Non labored breathing on room air, no wheezing or rales.  Abdominal: Soft. Normal bowel sounds. Non distended and non tender Musculoskeletal: LLE limited due to recent hip fx. L foot neurovascularly intact  Neurological: Alert and oriented to person, place, and time. Non focal  Skin: Skin is warm and dry.    Condition at discharge: good  The results of significant diagnostics from this hospitalization (including imaging, microbiology, ancillary and laboratory) are listed below for reference.    Imaging Studies: DG HIP UNILAT W OR W/O PELVIS 2-3 VIEWS LEFT Result Date: 06/04/2024 CLINICAL DATA:  Postop EXAM: DG HIP (WITH OR WITHOUT PELVIS) 2-3V LEFT COMPARISON:  None Available. FINDINGS: Post left hip total arthroplasty with the anatomic alignment. IMPRESSION: Post left hip total arthroplasty with the anatomic alignment. Electronically Signed   By: Fredrich Jefferson M.D.   On: 06/04/2024 08:53   CT HIP LEFT WO CONTRAST Result Date: 06/02/2024 CLINICAL DATA:  Hip trauma, fracture suspected. EXAM: CT OF THE LEFT HIP WITHOUT CONTRAST TECHNIQUE: Multidetector CT imaging of the left hip was performed according to the standard protocol. Multiplanar CT image reconstructions were also generated. RADIATION DOSE REDUCTION: This exam was performed according to the departmental dose-optimization program which includes automated exposure control, adjustment of the mA and/or kV according to patient size and/or use of iterative reconstruction technique. COMPARISON:  Pelvic and left hip radiographs same date. CT of the right hip 07/20/2020. PET-CT 09/02/2017. FINDINGS: Bones/Joint/Cartilage The technologist deviated from protocol and saved axial images through the entire pelvis. The usual small field-of-view coronal and sagittal images through  the left hip are provided. The bones are mildly demineralized. There is a mildly impacted subcapital fracture of the left femoral neck. There are nondisplaced acute parasymphyseal pubic rami fractures on the left. There are sacroiliac degenerative changes bilaterally without diastasis of the sacroiliac joints or symphysis pubis. Status post proximal right femoral ORIF with a healed fracture and posttraumatic deformity. Previous L4-5 fusion with multilevel lumbar spondylosis. Ligaments Suboptimally assessed by CT. Muscles and Tendons The pelvic and left hip musculature appears normal. Soft tissues Generalized subcutaneous edema throughout the pelvic fat with mild asymmetric  involvement lateral to the proximal left femur. No focal hematoma, unexpected foreign body or soft tissue emphysema demonstrated. There are postsurgical changes within the low anterior abdominal wall. Calcified gallstones noted. IMPRESSION: 1. Mildly impacted subcapital fracture of the left femoral neck. 2. Nondisplaced acute parasymphyseal pubic rami fractures on the left. 3. Generalized subcutaneous edema throughout the pelvic fat with mild asymmetric involvement lateral to the proximal left femur. No focal hematoma, unexpected foreign body or soft tissue emphysema demonstrated. 4. Cholelithiasis. Electronically Signed   By: Elmon Hagedorn M.D.   On: 06/02/2024 17:00   DG Wrist Complete Left Result Date: 06/02/2024 CLINICAL DATA:  Left wrist pain after fall today. EXAM: LEFT WRIST - COMPLETE 3+ VIEW COMPARISON:  None Available. FINDINGS: There is no evidence of fracture or dislocation. Severe degenerative changes seen involving the first carpometacarpal joint. Soft tissues are unremarkable. IMPRESSION: Severe osteoarthritis of first carpometacarpal joint. No acute abnormality seen. Electronically Signed   By: Rosalene Colon M.D.   On: 06/02/2024 15:43   DG Shoulder Left Result Date: 06/02/2024 CLINICAL DATA:  Left shoulder pain after fall today. EXAM: LEFT SHOULDER - 2+ VIEW COMPARISON:  None Available. FINDINGS: There is no evidence of fracture or dislocation. Moderate degenerative changes seen involving the left acromioclavicular joint. Soft tissues are unremarkable. IMPRESSION: Moderate degenerative joint disease of left acromioclavicular joint. No acute abnormality seen. Electronically Signed   By: Rosalene Colon M.D.   On: 06/02/2024 15:41   DG Knee Complete 4 Views Left Result Date: 06/02/2024 CLINICAL DATA:  Status post fall EXAM: LEFT KNEE - COMPLETE 4+ VIEW COMPARISON:  January 17, 2011 FINDINGS: Total left knee arthroplasty with near anatomic alignment No fractures Minimal suprapatellar joint  effusion and infrapatellar swelling IMPRESSION: Total left knee arthroplasty with near anatomic alignment. Electronically Signed   By: Fredrich Jefferson M.D.   On: 06/02/2024 15:39   DG Hip Unilat W or Wo Pelvis 2-3 Views Left Result Date: 06/02/2024 CLINICAL DATA:  Left hip pain after fall. EXAM: DG HIP (WITH OR WITHOUT PELVIS) 2-3V LEFT COMPARISON:  July 20, 2020. FINDINGS: Status post surgical internal fixation of old proximal right femoral fracture. Probable mildly displaced subcapital fracture of proximal left femur is noted. No dislocation is noted. IMPRESSION: Probable mildly displaced proximal left femoral subcapital fracture. CT may be performed for further evaluation. Electronically Signed   By: Rosalene Colon M.D.   On: 06/02/2024 15:06   DG Chest 1 View Result Date: 06/02/2024 CLINICAL DATA:  Left hip pain after fall today. EXAM: CHEST  1 VIEW COMPARISON:  January 17, 2024. FINDINGS: Stable cardiomegaly. Both lungs are clear. The visualized skeletal structures are unremarkable. IMPRESSION: No active disease. Electronically Signed   By: Rosalene Colon M.D.   On: 06/02/2024 15:02   CT HEAD WO CONTRAST ( ) Result Date: 06/02/2024 CLINICAL DATA:  Head trauma, minor, normal mental status (Age 68-64y) EXAM: CT HEAD WITHOUT CONTRAST  TECHNIQUE: Contiguous axial images were obtained from the base of the skull through the vertex without intravenous contrast. RADIATION DOSE REDUCTION: This exam was performed according to the departmental dose-optimization program which includes automated exposure control, adjustment of the mA and/or kV according to patient size and/or use of iterative reconstruction technique. COMPARISON:  CT of the head dated December 26, 2018. FINDINGS: Brain: Chronic encephalomalacia changes in the left cerebellar hemisphere. Age-related cerebral volume loss and mild to moderate periventricular and deep cerebral white matter disease. No evidence of hemorrhage, mass, acute cortical infarct  or hydrocephalus. Vascular: Mild calcific atheromatous disease within the carotid siphons and vertebral arteries. Skull: Intact. Sinuses/Orbits: Status post bilateral cataract surgery. Visualized paranasal sinuses and mastoid air cells are clear. Other: None. IMPRESSION: 1. Chronic encephalomalacia changes within the left cerebellar hemisphere and moderate cerebral white matter disease. No apparent acute process. Electronically Signed   By: Maribeth Shivers M.D.   On: 06/02/2024 15:00   CT Cervical Spine Wo Contrast Result Date: 06/02/2024 CLINICAL DATA:  Neck trauma. EXAM: CT CERVICAL SPINE WITHOUT CONTRAST TECHNIQUE: Multidetector CT imaging of the cervical spine was performed without intravenous contrast. Multiplanar CT image reconstructions were also generated. RADIATION DOSE REDUCTION: This exam was performed according to the departmental dose-optimization program which includes automated exposure control, adjustment of the mA and/or kV according to patient size and/or use of iterative reconstruction technique. COMPARISON:  Cervical spine radiographs 11/22/2006. FINDINGS: Alignment: Minimal stepwise anterolisthesis at C3-4 and C4-5. No focal angulation. Skull base and vertebrae: No evidence of acute cervical spine fracture or traumatic subluxation. Soft tissues and spinal canal: No prevertebral fluid or swelling. No visible canal hematoma. Disc levels: Multilevel spondylosis with disc space narrowing and uncinate spurring most advanced at C5-6 and C6-7. Multilevel mild-to-moderate foraminal narrowing, greatest on the right at C5-6. Multilevel facet arthropathy. No large disc herniation or high-grade central stenosis identified. Upper chest: Clear lung apices. Other: Mild carotid atherosclerosis. Mild thyroid  heterogeneity, similar to previous chest CTA 03/01/2023. Given the patient's age, no followup recommended unless clinically warranted.(Ref: J Am Coll Radiol. 2015 Feb;12(2): 143-50). IMPRESSION: 1. No  evidence of acute cervical spine fracture, traumatic subluxation or static signs of instability. 2. Multilevel cervical spondylosis as described. Electronically Signed   By: Elmon Hagedorn M.D.   On: 06/02/2024 14:50    Microbiology: Results for orders placed or performed during the hospital encounter of 06/02/24  MRSA Next Gen by PCR, Nasal     Status: None   Collection Time: 06/03/24  5:57 AM   Specimen: Nasal Mucosa; Nasal Swab  Result Value Ref Range Status   MRSA by PCR Next Gen NOT DETECTED NOT DETECTED Final    Comment: (NOTE) The GeneXpert MRSA Assay (FDA approved for NASAL specimens only), is one component of a comprehensive MRSA colonization surveillance program. It is not intended to diagnose MRSA infection nor to guide or monitor treatment for MRSA infections. Test performance is not FDA approved in patients less than 42 years old. Performed at [facility], [address] [place], [place] [address]     Labs: CBC: Recent Labs  Lab 06/02/24 1419 06/02/24 1724 06/03/24 0540 06/04/24 1337 06/05/24 0646 06/06/24 0610  WBC 7.8  --  6.6 8.2 7.4 7.8  NEUTROABS 6.6  --   --   --   --   --   HGB 10.0* 10.5* 9.0* 8.1* 7.7* 7.6*  HCT 31.5* 31.0* 28.2* 25.0* 23.9* 23.2*  MCV 86.1  --  83.9 83.6 82.7  82.6  PLT PLATELET CLUMPS NOTED ON SMEAR, UNABLE TO ESTIMATE  --  102* 86* 86* 103*   Basic Metabolic Panel: Recent Labs  Lab 06/02/24 1609 06/02/24 1724 06/03/24 0540 06/04/24 1337 06/05/24 0646 06/06/24 0610  NA 138 140 137 135 138 138  K 4.0 3.9 3.8 3.9 3.8 3.6  CL 107 109 107 103 108 108  CO2 23  --  21* 22 23 23   GLUCOSE 154* 147* 88 213* 92 117*  BUN 25* 23 24* 21 23 24*  CREATININE 1.34* 1.30* 1.42* 1.62* 1.57* 1.43*  CALCIUM  8.9  --  8.6* 7.9* 8.0* 8.4*  MG  --   --   --   --   --  2.0  PHOS  --   --   --   --   --  3.4   Liver Function Tests: Recent Labs  Lab 06/02/24 1609  AST 22  ALT 10  ALKPHOS 90  BILITOT 0.5  PROT 6.6  ALBUMIN 3.0*    CBG: Recent Labs  Lab 06/05/24 1119 06/05/24 1606 06/05/24 2157 06/06/24 0603 06/06/24 1154  GLUCAP 170* 85 159* 111* 145*    Discharge time spent: greater than 30 minutes.  Signed: Joette Mustard, MD [facility] 06/06/2024

## 2024-06-06 NOTE — Progress Notes (Signed)
    3 Days Post-Op Procedure(s) (LRB): HEMIARTHROPLASTY (BIPOLAR) HIP, POSTERIOR APPROACH FOR FRACTURE (Left)  Subjective: Patient reports pain as mild.  Walked 72ft with PT yesterday.  Daughter at bedside.  No overnight events.  Hgb 7.7 yesterday, still trending down from 9.0 preop.  Spoke with hospitalist, plan for one unit to be transfused today.  Likely SNF upon discharge.  No other complaints at this time.  Family member at bedside, discussed surgery details at length.  Patient and FM report high satisfaction with care.  Objective:   VITALS:   Vitals:   06/05/24 0712 06/05/24 1443 06/05/24 2012 06/06/24 0351  BP: (!) 142/77 136/66 (!) 154/64 (!) 144/60  Pulse:  (!) 53 (!) 54 (!) 52  Resp: 15 16 15 14   Temp: 98.2 F (36.8 C) 98.6 F (37 C) (!) 97.4 F (36.3 C) (!) 97.5 F (36.4 C)  TempSrc: Oral Oral    SpO2: 98% 97% 100% 100%  Weight:      Height:        AAOx4, in NAD Sensation intact distally Intact pulses distally Dorsiflexion/Plantar flexion intact Incision: dressing C/D/I Compartment soft Wiggles toes appropriately    Lab Results  Component Value Date   WBC 7.4 06/05/2024   HGB 7.7 (L) 06/05/2024   HCT 23.9 (L) 06/05/2024   MCV 82.7 06/05/2024   PLT 86 (L) 06/05/2024   BMET    Component Value Date/Time   NA 138 06/05/2024 0646   K 3.8 06/05/2024 0646   CL 108 06/05/2024 0646   CO2 23 06/05/2024 0646   GLUCOSE 92 06/05/2024 0646   GLUCOSE 87 01/06/2007 1521   BUN 23 06/05/2024 0646   CREATININE 1.57 (H) 06/05/2024 0646   CREATININE 1.35 (H) 08/18/2020 1430   CALCIUM  8.0 (L) 06/05/2024 0646   GFRNONAA 31 (L) 06/05/2024 0646   GFRNONAA 35 (L) 08/18/2020 1430      Xray: cemented hemi components in good position, no adverse features  Assessment/Plan: 3 Days Post-Op   Principal Problem:   Closed subcapital fracture of neck of femur, left, initial encounter (HCC) Active Problems:   DM2 (diabetes mellitus, type 2) (HCC)   Gout   HTN  (hypertension)   History of cardioembolic cerebrovascular accident (CVA)   GERD (gastroesophageal reflux disease)   Primary open angle glaucoma of both eyes, indeterminate stage   Normocytic anemia   Hyperlipidemia   Parkinsonian features   Stage 3b chronic kidney disease (CKD) (HCC)   Depression   Charles Bonnet syndrome   Blindness of both eyes  Hgb 7.7 yesterday.  May consider transfusion and/or holding DVT prophylaxis as needed. -- Likely receiving one unit today per hospitalist.  Okay to discharge from orthopedic standpoint.  S/p Left hip hemi for femoral neck fx 06/04/24  Post op recs: WB: WBAT   Abx: ancef  x23 hours post op Imaging: PACU xrays Dressing: Aquacel dressing to be kept intact until follow-up DVT prophylaxis: ASA 81 mg twice daily x 4 weeks Follow up: 2 weeks after surgery for a wound check with Dr. Pryor Browning at [facility].  Address: [address], [place], [place] [address]  Office Phone: [phone]   Albertus Alt 06/06/2024, 7:36 AM     Contact information:   Weekdays 7am-5pm epic message Dr. Pryor Browning, or call office for patient follow up: [phone] After hours and holidays please check [email or web address] for group call information for [facility]

## 2024-06-06 NOTE — Progress Notes (Signed)
 Patient picked up by [facility] for transportation to [facility]. Iv line removed and discharge paper handed over to [facility]. Patient stable when getting transferred and transported.

## 2024-06-07 LAB — BPAM RBC
Blood Product Expiration Date: 202506232359
Blood Product Expiration Date: 202506232359
ISSUE DATE / TIME: 202506141251
Unit Type and Rh: 6200
Unit Type and Rh: 6200

## 2024-06-07 LAB — TYPE AND SCREEN
ABO/RH(D): AB POS
Antibody Screen: POSITIVE
Donor AG Type: NEGATIVE
Donor AG Type: NEGATIVE
Unit division: 0
Unit division: 0

## 2024-06-07 LAB — PREPARE RBC (CROSSMATCH)

## 2024-06-08 LAB — TYPE AND SCREEN
ABO/RH(D): AB POS
Antibody Screen: POSITIVE
Donor AG Type: NEGATIVE
Unit division: 0

## 2024-06-08 LAB — BPAM RBC
Blood Product Expiration Date: 202506232359
Unit Type and Rh: 6200

## 2024-06-10 DIAGNOSIS — I10 Essential (primary) hypertension: Secondary | ICD-10-CM | POA: Diagnosis not present

## 2024-06-10 DIAGNOSIS — L97509 Non-pressure chronic ulcer of other part of unspecified foot with unspecified severity: Secondary | ICD-10-CM | POA: Diagnosis not present

## 2024-06-10 DIAGNOSIS — E785 Hyperlipidemia, unspecified: Secondary | ICD-10-CM | POA: Diagnosis not present

## 2024-06-10 DIAGNOSIS — E1140 Type 2 diabetes mellitus with diabetic neuropathy, unspecified: Secondary | ICD-10-CM | POA: Diagnosis not present

## 2024-06-10 DIAGNOSIS — L988 Other specified disorders of the skin and subcutaneous tissue: Secondary | ICD-10-CM | POA: Diagnosis not present

## 2024-06-16 ENCOUNTER — Institutional Professional Consult (permissible substitution) (HOSPITAL_BASED_OUTPATIENT_CLINIC_OR_DEPARTMENT_OTHER): Admitting: Cardiovascular Disease

## 2024-06-16 DIAGNOSIS — S72042D Displaced fracture of base of neck of left femur, subsequent encounter for closed fracture with routine healing: Secondary | ICD-10-CM | POA: Diagnosis not present

## 2024-06-21 DIAGNOSIS — M47812 Spondylosis without myelopathy or radiculopathy, cervical region: Secondary | ICD-10-CM | POA: Diagnosis not present

## 2024-06-21 DIAGNOSIS — N179 Acute kidney failure, unspecified: Secondary | ICD-10-CM | POA: Diagnosis not present

## 2024-06-21 DIAGNOSIS — I10 Essential (primary) hypertension: Secondary | ICD-10-CM | POA: Diagnosis not present

## 2024-06-21 DIAGNOSIS — N1832 Chronic kidney disease, stage 3b: Secondary | ICD-10-CM | POA: Diagnosis not present

## 2024-06-21 DIAGNOSIS — Z96642 Presence of left artificial hip joint: Secondary | ICD-10-CM | POA: Diagnosis not present

## 2024-06-21 DIAGNOSIS — S72012D Unspecified intracapsular fracture of left femur, subsequent encounter for closed fracture with routine healing: Secondary | ICD-10-CM | POA: Diagnosis not present

## 2024-06-21 DIAGNOSIS — E1122 Type 2 diabetes mellitus with diabetic chronic kidney disease: Secondary | ICD-10-CM | POA: Diagnosis not present

## 2024-06-21 DIAGNOSIS — D631 Anemia in chronic kidney disease: Secondary | ICD-10-CM | POA: Diagnosis not present

## 2024-06-21 DIAGNOSIS — S32592D Other specified fracture of left pubis, subsequent encounter for fracture with routine healing: Secondary | ICD-10-CM | POA: Diagnosis not present

## 2024-06-22 ENCOUNTER — Telehealth: Payer: Self-pay

## 2024-06-22 ENCOUNTER — Telehealth: Payer: Self-pay | Admitting: Internal Medicine

## 2024-06-22 DIAGNOSIS — N179 Acute kidney failure, unspecified: Secondary | ICD-10-CM | POA: Diagnosis not present

## 2024-06-22 DIAGNOSIS — S32592D Other specified fracture of left pubis, subsequent encounter for fracture with routine healing: Secondary | ICD-10-CM | POA: Diagnosis not present

## 2024-06-22 DIAGNOSIS — D631 Anemia in chronic kidney disease: Secondary | ICD-10-CM | POA: Diagnosis not present

## 2024-06-22 DIAGNOSIS — Z96642 Presence of left artificial hip joint: Secondary | ICD-10-CM | POA: Diagnosis not present

## 2024-06-22 DIAGNOSIS — N1832 Chronic kidney disease, stage 3b: Secondary | ICD-10-CM | POA: Diagnosis not present

## 2024-06-22 DIAGNOSIS — S72012D Unspecified intracapsular fracture of left femur, subsequent encounter for closed fracture with routine healing: Secondary | ICD-10-CM | POA: Diagnosis not present

## 2024-06-22 DIAGNOSIS — M47812 Spondylosis without myelopathy or radiculopathy, cervical region: Secondary | ICD-10-CM | POA: Diagnosis not present

## 2024-06-22 DIAGNOSIS — E1122 Type 2 diabetes mellitus with diabetic chronic kidney disease: Secondary | ICD-10-CM | POA: Diagnosis not present

## 2024-06-22 DIAGNOSIS — I10 Essential (primary) hypertension: Secondary | ICD-10-CM | POA: Diagnosis not present

## 2024-06-22 NOTE — Telephone Encounter (Unsigned)
 Copied from CRM [id number]. Topic: Clinical - Home Health Verbal Orders >> Jun 22, 2024 10:25 AM Gustabo D wrote: Caller/Agency: [facility] Callback Number: [phone]- confidential voicemail setup Service Requested: Skilled Nursing Frequency: see her 2 week 1 , and 1 week 2  Any new concerns about the patient? Yes, during visit patient's heart rate was in the 40s and 50s patient says it's always this way. She feels dizzy sometimes.Frequency is to monitor her overall cardiac.

## 2024-06-22 NOTE — Telephone Encounter (Signed)
 Copied from CRM [id number]. Topic: Clinical - Home Health Verbal Orders >> Jun 22, 2024  1:18 PM Rea BROCKS wrote: Caller/Agency: [facility] Callback Number: [phone] Service Requested: Physical Therapy Frequency: 2 times a week for three weeks and 1 time a week for five weeks  Any new concerns about the patient? Yes Patient has questions about changes in gabepentin medicine   Patient now reports that she has intermittent pain in both heels   Patient reports a change in increased frequency at night time with urination and tenderness

## 2024-06-23 DIAGNOSIS — N179 Acute kidney failure, unspecified: Secondary | ICD-10-CM | POA: Diagnosis not present

## 2024-06-23 DIAGNOSIS — N1832 Chronic kidney disease, stage 3b: Secondary | ICD-10-CM | POA: Diagnosis not present

## 2024-06-23 DIAGNOSIS — Z96642 Presence of left artificial hip joint: Secondary | ICD-10-CM | POA: Diagnosis not present

## 2024-06-23 DIAGNOSIS — D631 Anemia in chronic kidney disease: Secondary | ICD-10-CM | POA: Diagnosis not present

## 2024-06-23 DIAGNOSIS — M47812 Spondylosis without myelopathy or radiculopathy, cervical region: Secondary | ICD-10-CM | POA: Diagnosis not present

## 2024-06-23 DIAGNOSIS — S72012D Unspecified intracapsular fracture of left femur, subsequent encounter for closed fracture with routine healing: Secondary | ICD-10-CM | POA: Diagnosis not present

## 2024-06-23 DIAGNOSIS — S32592D Other specified fracture of left pubis, subsequent encounter for fracture with routine healing: Secondary | ICD-10-CM | POA: Diagnosis not present

## 2024-06-23 DIAGNOSIS — E1122 Type 2 diabetes mellitus with diabetic chronic kidney disease: Secondary | ICD-10-CM | POA: Diagnosis not present

## 2024-06-23 DIAGNOSIS — I10 Essential (primary) hypertension: Secondary | ICD-10-CM | POA: Diagnosis not present

## 2024-06-24 ENCOUNTER — Ambulatory Visit: Admitting: Internal Medicine

## 2024-06-24 ENCOUNTER — Encounter: Payer: Self-pay | Admitting: Internal Medicine

## 2024-06-24 VITALS — BP 130/62 | HR 80 | Temp 98.7°F | Ht 62.0 in

## 2024-06-24 DIAGNOSIS — R269 Unspecified abnormalities of gait and mobility: Secondary | ICD-10-CM | POA: Diagnosis not present

## 2024-06-24 DIAGNOSIS — E1121 Type 2 diabetes mellitus with diabetic nephropathy: Secondary | ICD-10-CM | POA: Diagnosis not present

## 2024-06-24 DIAGNOSIS — S72012D Unspecified intracapsular fracture of left femur, subsequent encounter for closed fracture with routine healing: Secondary | ICD-10-CM | POA: Diagnosis not present

## 2024-06-24 DIAGNOSIS — D649 Anemia, unspecified: Secondary | ICD-10-CM | POA: Diagnosis not present

## 2024-06-24 DIAGNOSIS — I10 Essential (primary) hypertension: Secondary | ICD-10-CM | POA: Diagnosis not present

## 2024-06-24 DIAGNOSIS — L89152 Pressure ulcer of sacral region, stage 2: Secondary | ICD-10-CM

## 2024-06-24 DIAGNOSIS — M79672 Pain in left foot: Secondary | ICD-10-CM | POA: Diagnosis not present

## 2024-06-24 DIAGNOSIS — N1832 Chronic kidney disease, stage 3b: Secondary | ICD-10-CM | POA: Diagnosis not present

## 2024-06-24 DIAGNOSIS — R5383 Other fatigue: Secondary | ICD-10-CM | POA: Diagnosis not present

## 2024-06-24 DIAGNOSIS — E1122 Type 2 diabetes mellitus with diabetic chronic kidney disease: Secondary | ICD-10-CM | POA: Diagnosis not present

## 2024-06-24 DIAGNOSIS — D631 Anemia in chronic kidney disease: Secondary | ICD-10-CM | POA: Diagnosis not present

## 2024-06-24 DIAGNOSIS — S32592D Other specified fracture of left pubis, subsequent encounter for fracture with routine healing: Secondary | ICD-10-CM | POA: Diagnosis not present

## 2024-06-24 DIAGNOSIS — M79671 Pain in right foot: Secondary | ICD-10-CM | POA: Diagnosis not present

## 2024-06-24 DIAGNOSIS — K5901 Slow transit constipation: Secondary | ICD-10-CM

## 2024-06-24 DIAGNOSIS — M47812 Spondylosis without myelopathy or radiculopathy, cervical region: Secondary | ICD-10-CM | POA: Diagnosis not present

## 2024-06-24 DIAGNOSIS — N179 Acute kidney failure, unspecified: Secondary | ICD-10-CM | POA: Diagnosis not present

## 2024-06-24 DIAGNOSIS — Z96642 Presence of left artificial hip joint: Secondary | ICD-10-CM | POA: Diagnosis not present

## 2024-06-24 DIAGNOSIS — R634 Abnormal weight loss: Secondary | ICD-10-CM

## 2024-06-24 LAB — CBC WITH DIFFERENTIAL/PLATELET
Basophils Absolute: 0 10*3/uL (ref 0.0–0.1)
Basophils Relative: 0.5 % (ref 0.0–3.0)
Eosinophils Absolute: 0.2 10*3/uL (ref 0.0–0.7)
Eosinophils Relative: 3.7 % (ref 0.0–5.0)
HCT: 29.1 % — ABNORMAL LOW (ref 36.0–46.0)
Hemoglobin: 9.4 g/dL — ABNORMAL LOW (ref 12.0–15.0)
Lymphocytes Relative: 13.4 % (ref 12.0–46.0)
Lymphs Abs: 0.6 10*3/uL — ABNORMAL LOW (ref 0.7–4.0)
MCHC: 32.2 g/dL (ref 30.0–36.0)
MCV: 85.7 fl (ref 78.0–100.0)
Monocytes Absolute: 0.5 10*3/uL (ref 0.1–1.0)
Monocytes Relative: 10.8 % (ref 3.0–12.0)
Neutro Abs: 3.3 10*3/uL (ref 1.4–7.7)
Neutrophils Relative %: 71.6 % (ref 43.0–77.0)
Platelets: 195 10*3/uL (ref 150.0–400.0)
RBC: 3.39 Mil/uL — ABNORMAL LOW (ref 3.87–5.11)
RDW: 18.7 % — ABNORMAL HIGH (ref 11.5–15.5)
WBC: 4.5 10*3/uL (ref 4.0–10.5)

## 2024-06-24 LAB — COMPREHENSIVE METABOLIC PANEL WITH GFR
ALT: 8 U/L (ref 0–35)
AST: 18 U/L (ref 0–37)
Albumin: 3.5 g/dL (ref 3.5–5.2)
Alkaline Phosphatase: 124 U/L — ABNORMAL HIGH (ref 39–117)
BUN: 22 mg/dL (ref 6–23)
CO2: 29 meq/L (ref 19–32)
Calcium: 9.3 mg/dL (ref 8.4–10.5)
Chloride: 106 meq/L (ref 96–112)
Creatinine, Ser: 1.24 mg/dL — ABNORMAL HIGH (ref 0.40–1.20)
GFR: 38.23 mL/min — ABNORMAL LOW (ref >60.00)
Glucose, Bld: 127 mg/dL — ABNORMAL HIGH (ref 70–99)
Potassium: 4.6 meq/L (ref 3.5–5.1)
Sodium: 141 meq/L (ref 135–145)
Total Bilirubin: 0.4 mg/dL (ref 0.2–1.2)
Total Protein: 7.1 g/dL (ref 6.0–8.3)

## 2024-06-24 LAB — TSH: TSH: 1.47 u[IU]/mL (ref 0.35–5.50)

## 2024-06-24 LAB — HEMOGLOBIN A1C: Hgb A1c MFr Bld: 6.3 % (ref 4.6–6.5)

## 2024-06-24 NOTE — Assessment & Plan Note (Signed)
 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

## 2024-06-24 NOTE — Progress Notes (Signed)
 Subjective:  Patient ID: Breanna [name], female    DOB: 02/08/1931  Age: 89 y.o. MRN: [id number]  CC: Hospitalization Follow-up (Pt was d/c 'd from [facility] and was not told about if she should restart any of the above medications that are paused. Pt states she is having left hip pain that is ranging from 7-8 score... Pt also has wounds and sores on b/l heels a/w ankle swelling.... Pt states PT has been increasing her pain in her hip and thighs.)   HPI  [employer] presents for a f/u - Pt was d/c 'd from [facility] and was not told about if she should restart any of the above medications that are paused. Pt states she is having left hip pain that is ranging from 7-8 score... Pt also has wounds and sores on b/l heels a/w ankle swelling.... Pt states PT has been increasing her pain in her hip and thighs. She is here w/son Koren   Per hx:  Admit date:     06/02/2024  Discharge date: 06/06/24  Discharge Physician: Alban Pepper    PCP: Garald Karlynn GAILS, MD    Recommendations at discharge:     At PCP office will need BMP to ensure kidney function continues to improve. Starting oral iron  will need repeat CBC in 2 weeks.  Started on vitamin D  will need to have vitamin D  levels checked in 4 months to ensure normalized.  Spironolactone , torsemide , and losartan  held due to kidney injury will need to see PCP in 1 week to decide whether to resume these. Metformin  also held due to kidney injury and allopurinol  dose reduced.    Discharge Diagnoses: Principal Problem:   Closed subcapital fracture of neck of femur, left, initial encounter (HCC) Active Problems:   DM2 (diabetes mellitus, type 2) (HCC)   Gout   HTN (hypertension)   History of cardioembolic cerebrovascular accident (CVA)   GERD (gastroesophageal reflux disease)   Primary open angle glaucoma of both eyes, indeterminate stage   Normocytic anemia   Hyperlipidemia   Parkinsonian features   Depression   Carlin Abrahams  syndrome   Blindness of both eyes   Stage 3a chronic kidney disease (CKD) (HCC)   Resolved Problems:   * No resolved hospital problems. *   Hospital Course: Per H&P HPI HPI: Breanna [name] is a 89 y.o. female with medical history significant of hypertension, hyperlipidemia, CKD 3B, anemia, CVA, GERD, gout, diabetes, depression, parkinsonian features, glaucoma, blindness, Carlin Dolly syndrome presenting after fall at home.   Patient was reportedly heading to the bathroom and reaching out for the commode when she slipped and fell.  She landed on her left side and did hit her head on the ground.  Did briefly lose consciousness.  Afterwards reported left hip pain and has not ambulated since.  Contusion to her head and also noted some mild knee swelling.  EMS reported heart rate in the 40s.   Denies fevers, chills, chest pain, shortness of breath, abdominal pain, constipation, diarrhea, nausea, vomiting.   Assessment and Plan: L hip fracture  After mechanical fall.  S/p OR with orthopedics 6/11 for cemented L hip hemiarthroplasty  Pain control APAP and low dose oxycodone  for severe pain  Home gabapentin  changed to at night only  Will need 28 days of ASA 81mg  twice a day .    AKI on CKD3a     Latest Ref Rng & Units 06/06/2024    6:10 AM 06/05/2024    6:46  AM 06/04/2024    1:37 PM  BMP  Glucose 70 - 99 mg/dL 882  92  786   BUN 8 - 23 mg/dL 24  23  21    Creatinine 0.44 - 1.00 mg/dL 8.56  8.42  8.37   Sodium 135 - 145 mmol/L 138  138  135   Potassium 3.5 - 5.1 mmol/L 3.6  3.8  3.9   Chloride 98 - 111 mmol/L 108  108  103   CO2 22 - 32 mmol/L 23  23  22    Calcium  8.9 - 10.3 mg/dL 8.4  8.0  7.9     Continues to improve with IV hydration. Encourage patient to eat and drink.  She will get this rechecked in 1 week with her PCP.    HTN Currently on amlodipine  and reduced dose Coreg  (due to heart rate). Home torsemide , losartan , and spironolactone  were held due to AKI.     06/06/2024     8:17 AM 06/06/2024    3:51 AM 06/05/2024    8:12 PM  Vitals with BMI  Systolic 139 144 845  Diastolic 61 60 64  Pulse 55 52 54    Blood pressure on heart rate appropriate for patient's age. Patient will see her PCP in 1 week to decide if these medications should be resumed.     Normocytic anemia Iron  deficiency Iron /TIBC/Ferritin/ %Sat Labs (Brief)          Component Value Date/Time    IRON  9 (L) 06/05/2024 0646    TIBC 245 (L) 06/05/2024 0646    FERRITIN 115 06/05/2024 0646    IRONPCTSAT 4 (L) 06/05/2024 0646    IRONPCTSAT 14 (L) 04/16/2024 1438          Latest Ref Rng & Units 06/06/2024    6:10 AM 06/05/2024    6:46 AM 06/04/2024    1:37 PM  CBC  WBC 4.0 - 10.5 K/uL 7.8  7.4  8.2   Hemoglobin 12.0 - 15.0 g/dL 7.6  7.7  8.1   Hematocrit 36.0 - 46.0 % 23.2  23.9  25.0   Platelets 150 - 400 K/uL 103  86  86   Hemoglobin slowly downtrending. Patient received 1 dose of IV iron  500 mg this hospitalization Will continue p.o. supplementation and give 1 unit of packed red blood cells. She will need repeat labs with her PCP in 2 weeks   Vitamin D  insufficiency  Vitamin D  level 26.92 Discharge patient on vitamin D  supplementation   Thrombocytopenia Noted to have clumps on previous  CBC. 103k this a.m. Folate and b12 WNL. No intervention warranted.    H/o CVA  On asa and low dose statin.      T2DM  Well controlled. A1c 6.2. Home metformin  held due to AKI.    H/o Gout No concern for acute flare.  Transitioned to reduced dose of 50mg  every other day. She will follow up with her PCP to decide when/if this medication should be further titrated.    Depression Home sertraline    GERD Noted.    Glaucoma Legal blindness Carlin Dolly syndrome > Blindness from glaucoma.  Has syndrome which she has hallucinations due to her vision loss that she knows are not real.        Pain control - [place]  Controlled Substance Reporting System database was reviewed.  and patient was instructed, not to drive, operate heavy machinery, perform activities at heights, swimming or participation in water activities or provide baby-sitting services while on  Pain, Sleep and Anxiety Medications; until their outpatient Physician has advised to do so again. Also recommended to not to take more than prescribed Pain, Sleep and Anxiety Medications.  Consultants: Orthopedics  Procedures performed: Left cemented hip hemiarthroplasty   Disposition: Skilled nursing facility Diet recommendation:  Regular diet DISCHARGE MEDICATION: Allergies as of 06/06/2024     Outpatient Medications Prior to Visit  Medication Sig Dispense Refill   acetaminophen  (TYLENOL ) 325 MG tablet Take 2 tablets (650 mg total) by mouth every 4 (four) hours.     allopurinol  (ZYLOPRIM ) 100 MG tablet Take 0.5 tablets (50 mg total) by mouth every other day.     amLODipine  (NORVASC ) 10 MG tablet Take 1 tablet (10 mg total) by mouth daily. 90 tablet 3   artificial tears ophthalmic solution Place 1 drop into both eyes as needed for dry eyes.     aspirin  EC 81 MG tablet Take 1 tablet (81 mg total) by mouth 2 (two) times daily for 28 days. Swallow whole.     atropine  1 % ophthalmic solution Place 1 drop into both eyes daily.     calcium  carbonate (OSCAL) 1500 (600 Ca) MG TABS tablet Take 1,500 mg by mouth daily.     carvedilol  (COREG ) 25 MG tablet Take 0.5 tablets (12.5 mg total) by mouth 2 (two) times daily with a meal.     cholecalciferol  (CHOLECALCIFEROL ) 25 MCG tablet Take 1 tablet (1,000 Units total) by mouth daily.     [employer] VITAMIN B12 1000 MCG tablet TAKE 1 TABLET (1,000 MCG TOTAL) BY MOUTH EVERY DAY 90 tablet 3   docusate sodium  (COLACE) 100 MG capsule TAKE 1 CAPSULE BY MOUTH TWICE A DAY 180 capsule 3   dorzolamide -timolol  (COSOPT ) 22.3-6.8 MG/ML ophthalmic solution Place 1 drop into both eyes 2 (two) times daily.      feeding supplement (ENSURE PLUS HIGH PROTEIN) LIQD Take 237 mLs by mouth 2 (two) times  daily between meals.     ferrous sulfate  325 (65 FE) MG tablet Take 1 tablet (325 mg total) by mouth daily with breakfast.     gabapentin  (NEURONTIN ) 100 MG capsule Take 1 capsule (100 mg total) by mouth at bedtime.     glucose blood (GNP TRUE METRIX GLUCOSE STRIPS) test strip Use to check blood sugars twice a day 200 each 2   latanoprost  (XALATAN ) 0.005 % ophthalmic solution Place 1 drop into both eyes at bedtime.      losartan  (COZAAR ) 50 MG tablet TAKE 1 TABLET BY MOUTH EVERY DAY 90 tablet 3   metFORMIN  (GLUCOPHAGE ) 500 MG tablet Take 500 mg by mouth daily with breakfast.     ondansetron  (ZOFRAN -ODT) 4 MG disintegrating tablet Take 1 tablet (4 mg total) by mouth every 8 (eight) hours as needed for nausea or vomiting.     pantoprazole  (PROTONIX ) 40 MG tablet TAKE 1 TABLET BY MOUTH EVERY DAY 90 tablet 3   polyethylene glycol (MIRALAX  / GLYCOLAX ) 17 g packet Take 17 g by mouth daily as needed for mild constipation.     rosuvastatin  (CRESTOR ) 5 MG tablet Take 5 mg by mouth daily.     senna (SENOKOT) 8.6 MG TABS tablet Take 1 tablet (8.6 mg total) by mouth at bedtime. While taking opioids     sertraline  (ZOLOFT ) 100 MG tablet Take 1 tablet (100 mg total) by mouth at bedtime. 90 tablet 1   spironolactone  (ALDACTONE ) 25 MG tablet Take 25 mg by mouth 3 (three) times a week.     torsemide  (  DEMADEX ) 20 MG tablet Take 20 mg by mouth daily.     No facility-administered medications prior to visit.    ROS: Review of Systems  Constitutional:  Positive for fatigue. Negative for activity change, appetite change, chills and unexpected weight change.  HENT:  Negative for congestion, mouth sores and sinus pressure.   Eyes:  Positive for visual disturbance.  Respiratory:  Negative for cough and chest tightness.   Gastrointestinal:  Negative for abdominal pain and nausea.  Genitourinary:  Negative for difficulty urinating, frequency and vaginal pain.  Musculoskeletal:  Positive for arthralgias, back pain and  gait problem.  Skin:  Negative for pallor and rash.  Neurological:  Negative for dizziness, tremors, weakness, numbness and headaches.  Psychiatric/Behavioral:  Negative for confusion, sleep disturbance and suicidal ideas.     Objective:  BP 130/62   Pulse 80   Temp 98.7 F (37.1 C) (Oral)   Ht 5' 2 (1.575 m)   SpO2 97%   BMI 19.94 kg/m   BP Readings from Last 3 Encounters:  06/24/24 130/62  06/06/24 (!) 124/58  05/22/24 130/62    Wt Readings from Last 3 Encounters:  06/03/24 109 lb (49.4 kg)  05/22/24 108 lb (49 kg)  04/23/24 107 lb (48.5 kg)    Physical Exam Constitutional:      General: She is not in acute distress.    Appearance: She is well-developed. She is obese.  HENT:     Head: Normocephalic.     Right Ear: External ear normal.     Left Ear: External ear normal.     Nose: Nose normal.  Eyes:     General:        Right eye: No discharge.        Left eye: No discharge.     Conjunctiva/sclera: Conjunctivae normal.     Pupils: Pupils are equal, round, and reactive to light.  Neck:     Thyroid : No thyromegaly.     Vascular: No JVD.     Trachea: No tracheal deviation.  Cardiovascular:     Rate and Rhythm: Normal rate and regular rhythm.     Heart sounds: Normal heart sounds.  Pulmonary:     Effort: No respiratory distress.     Breath sounds: No stridor. No wheezing.  Abdominal:     General: Bowel sounds are normal. There is no distension.     Palpations: Abdomen is soft. There is no mass.     Tenderness: There is no abdominal tenderness. There is no guarding or rebound.  Musculoskeletal:        General: Tenderness present.     Cervical back: Normal range of motion and neck supple. No rigidity.     Right lower leg: No edema.     Left lower leg: No edema.  Lymphadenopathy:     Cervical: No cervical adenopathy.  Skin:    Findings: No erythema or rash.  Neurological:     Mental Status: Mental status is at baseline.     Cranial Nerves: No cranial  nerve deficit.     Motor: No abnormal muscle tone.     Coordination: Coordination normal.     Gait: Gait abnormal.     Deep Tendon Reflexes: Reflexes normal.  Psychiatric:        Behavior: Behavior normal.        Thought Content: Thought content normal.        Judgment: Judgment normal.    In a w/c  Blind B  heels - WNL Ankles - WNL  Lab Results  Component Value Date   WBC 7.8 06/06/2024   HGB 7.6 (L) 06/06/2024   HCT 23.2 (L) 06/06/2024   PLT 103 (L) 06/06/2024   GLUCOSE 117 (H) 06/06/2024   CHOL 127 03/01/2023   TRIG 42 03/01/2023   HDL 53 03/01/2023   LDLCALC 66 03/01/2023   ALT 10 06/02/2024   AST 22 06/02/2024   NA 138 06/06/2024   K 3.6 06/06/2024   CL 108 06/06/2024   CREATININE 1.43 (H) 06/06/2024   BUN 24 (H) 06/06/2024   CO2 23 06/06/2024   TSH 0.54 09/11/2023   HGBA1C 6.2 (H) 06/04/2024    CT HIP LEFT WO CONTRAST Result Date: 06/02/2024 CLINICAL DATA:  Hip trauma, fracture suspected. EXAM: CT OF THE LEFT HIP WITHOUT CONTRAST TECHNIQUE: Multidetector CT imaging of the left hip was performed according to the standard protocol. Multiplanar CT image reconstructions were also generated. RADIATION DOSE REDUCTION: This exam was performed according to the departmental dose-optimization program which includes automated exposure control, adjustment of the mA and/or kV according to patient size and/or use of iterative reconstruction technique. COMPARISON:  Pelvic and left hip radiographs same date. CT of the right hip 07/20/2020. PET-CT 09/02/2017. FINDINGS: Bones/Joint/Cartilage The technologist deviated from protocol and saved axial images through the entire pelvis. The usual small field-of-view coronal and sagittal images through the left hip are provided. The bones are mildly demineralized. There is a mildly impacted subcapital fracture of the left femoral neck. There are nondisplaced acute parasymphyseal pubic rami fractures on the left. There are sacroiliac degenerative  changes bilaterally without diastasis of the sacroiliac joints or symphysis pubis. Status post proximal right femoral ORIF with a healed fracture and posttraumatic deformity. Previous L4-5 fusion with multilevel lumbar spondylosis. Ligaments Suboptimally assessed by CT. Muscles and Tendons The pelvic and left hip musculature appears normal. Soft tissues Generalized subcutaneous edema throughout the pelvic fat with mild asymmetric involvement lateral to the proximal left femur. No focal hematoma, unexpected foreign body or soft tissue emphysema demonstrated. There are postsurgical changes within the low anterior abdominal wall. Calcified gallstones noted. IMPRESSION: 1. Mildly impacted subcapital fracture of the left femoral neck. 2. Nondisplaced acute parasymphyseal pubic rami fractures on the left. 3. Generalized subcutaneous edema throughout the pelvic fat with mild asymmetric involvement lateral to the proximal left femur. No focal hematoma, unexpected foreign body or soft tissue emphysema demonstrated. 4. Cholelithiasis. Electronically Signed   By: Elsie Perone M.D.   On: 06/02/2024 17:00   DG Wrist Complete Left Result Date: 06/02/2024 CLINICAL DATA:  Left wrist pain after fall today. EXAM: LEFT WRIST - COMPLETE 3+ VIEW COMPARISON:  None Available. FINDINGS: There is no evidence of fracture or dislocation. Severe degenerative changes seen involving the first carpometacarpal joint. Soft tissues are unremarkable. IMPRESSION: Severe osteoarthritis of first carpometacarpal joint. No acute abnormality seen. Electronically Signed   By: Lynwood Landy Raddle M.D.   On: 06/02/2024 15:43   DG Shoulder Left Result Date: 06/02/2024 CLINICAL DATA:  Left shoulder pain after fall today. EXAM: LEFT SHOULDER - 2+ VIEW COMPARISON:  None Available. FINDINGS: There is no evidence of fracture or dislocation. Moderate degenerative changes seen involving the left acromioclavicular joint. Soft tissues are unremarkable. IMPRESSION:  Moderate degenerative joint disease of left acromioclavicular joint. No acute abnormality seen. Electronically Signed   By: Lynwood Landy Raddle M.D.   On: 06/02/2024 15:41   DG Knee Complete 4 Views Left Result Date: 06/02/2024 CLINICAL DATA:  Status post fall EXAM: LEFT KNEE - COMPLETE 4+ VIEW COMPARISON:  January 17, 2011 FINDINGS: Total left knee arthroplasty with near anatomic alignment No fractures Minimal suprapatellar joint effusion and infrapatellar swelling IMPRESSION: Total left knee arthroplasty with near anatomic alignment. Electronically Signed   By: Franky Chard M.D.   On: 06/02/2024 15:39   DG Hip Unilat W or Wo Pelvis 2-3 Views Left Result Date: 06/02/2024 CLINICAL DATA:  Left hip pain after fall. EXAM: DG HIP (WITH OR WITHOUT PELVIS) 2-3V LEFT COMPARISON:  July 20, 2020. FINDINGS: Status post surgical internal fixation of old proximal right femoral fracture. Probable mildly displaced subcapital fracture of proximal left femur is noted. No dislocation is noted. IMPRESSION: Probable mildly displaced proximal left femoral subcapital fracture. CT may be performed for further evaluation. Electronically Signed   By: Lynwood Landy Raddle M.D.   On: 06/02/2024 15:06   DG Chest 1 View Result Date: 06/02/2024 CLINICAL DATA:  Left hip pain after fall today. EXAM: CHEST  1 VIEW COMPARISON:  January 17, 2024. FINDINGS: Stable cardiomegaly. Both lungs are clear. The visualized skeletal structures are unremarkable. IMPRESSION: No active disease. Electronically Signed   By: Lynwood Landy Raddle M.D.   On: 06/02/2024 15:02   CT HEAD WO CONTRAST ( ) Result Date: 06/02/2024 CLINICAL DATA:  Head trauma, minor, normal mental status (Age 51-64y) EXAM: CT HEAD WITHOUT CONTRAST TECHNIQUE: Contiguous axial images were obtained from the base of the skull through the vertex without intravenous contrast. RADIATION DOSE REDUCTION: This exam was performed according to the departmental dose-optimization program which includes  automated exposure control, adjustment of the mA and/or kV according to patient size and/or use of iterative reconstruction technique. COMPARISON:  CT of the head dated December 26, 2018. FINDINGS: Brain: Chronic encephalomalacia changes in the left cerebellar hemisphere. Age-related cerebral volume loss and mild to moderate periventricular and deep cerebral white matter disease. No evidence of hemorrhage, mass, acute cortical infarct or hydrocephalus. Vascular: Mild calcific atheromatous disease within the carotid siphons and vertebral arteries. Skull: Intact. Sinuses/Orbits: Status post bilateral cataract surgery. Visualized paranasal sinuses and mastoid air cells are clear. Other: None. IMPRESSION: 1. Chronic encephalomalacia changes within the left cerebellar hemisphere and moderate cerebral white matter disease. No apparent acute process. Electronically Signed   By: Evalene Coho M.D.   On: 06/02/2024 15:00   CT Cervical Spine Wo Contrast Result Date: 06/02/2024 CLINICAL DATA:  Neck trauma. EXAM: CT CERVICAL SPINE WITHOUT CONTRAST TECHNIQUE: Multidetector CT imaging of the cervical spine was performed without intravenous contrast. Multiplanar CT image reconstructions were also generated. RADIATION DOSE REDUCTION: This exam was performed according to the departmental dose-optimization program which includes automated exposure control, adjustment of the mA and/or kV according to patient size and/or use of iterative reconstruction technique. COMPARISON:  Cervical spine radiographs 11/22/2006. FINDINGS: Alignment: Minimal stepwise anterolisthesis at C3-4 and C4-5. No focal angulation. Skull base and vertebrae: No evidence of acute cervical spine fracture or traumatic subluxation. Soft tissues and spinal canal: No prevertebral fluid or swelling. No visible canal hematoma. Disc levels: Multilevel spondylosis with disc space narrowing and uncinate spurring most advanced at C5-6 and C6-7. Multilevel  mild-to-moderate foraminal narrowing, greatest on the right at C5-6. Multilevel facet arthropathy. No large disc herniation or high-grade central stenosis identified. Upper chest: Clear lung apices. Other: Mild carotid atherosclerosis. Mild thyroid  heterogeneity, similar to previous chest CTA 03/01/2023. Given the patient's age, no followup recommended unless clinically warranted.(Ref: J Am Coll Radiol. 2015 Feb;12(2): 143-50). IMPRESSION: 1. No evidence  of acute cervical spine fracture, traumatic subluxation or static signs of instability. 2. Multilevel cervical spondylosis as described. Electronically Signed   By: Elsie Perone M.D.   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     Follow-up: Return in about 6 weeks (around 08/05/2024) for a follow-up visit.  Marolyn Noel, MD

## 2024-06-25 ENCOUNTER — Ambulatory Visit: Payer: Self-pay | Admitting: Internal Medicine

## 2024-06-25 DIAGNOSIS — E1122 Type 2 diabetes mellitus with diabetic chronic kidney disease: Secondary | ICD-10-CM | POA: Diagnosis not present

## 2024-06-25 DIAGNOSIS — I10 Essential (primary) hypertension: Secondary | ICD-10-CM | POA: Diagnosis not present

## 2024-06-25 DIAGNOSIS — M47812 Spondylosis without myelopathy or radiculopathy, cervical region: Secondary | ICD-10-CM | POA: Diagnosis not present

## 2024-06-25 DIAGNOSIS — S32592D Other specified fracture of left pubis, subsequent encounter for fracture with routine healing: Secondary | ICD-10-CM | POA: Diagnosis not present

## 2024-06-25 DIAGNOSIS — S72012D Unspecified intracapsular fracture of left femur, subsequent encounter for closed fracture with routine healing: Secondary | ICD-10-CM | POA: Diagnosis not present

## 2024-06-25 DIAGNOSIS — N1832 Chronic kidney disease, stage 3b: Secondary | ICD-10-CM | POA: Diagnosis not present

## 2024-06-25 DIAGNOSIS — D631 Anemia in chronic kidney disease: Secondary | ICD-10-CM | POA: Diagnosis not present

## 2024-06-25 DIAGNOSIS — Z96642 Presence of left artificial hip joint: Secondary | ICD-10-CM | POA: Diagnosis not present

## 2024-06-25 DIAGNOSIS — N179 Acute kidney failure, unspecified: Secondary | ICD-10-CM | POA: Diagnosis not present

## 2024-06-25 LAB — IRON,TIBC AND FERRITIN PANEL
%SAT: 17 % (ref 16–45)
Ferritin: 483 ng/mL — ABNORMAL HIGH (ref 16–288)
Iron: 41 ug/dL — ABNORMAL LOW (ref 45–160)
TIBC: 246 ug/dL — ABNORMAL LOW (ref 250–450)

## 2024-06-25 NOTE — Telephone Encounter (Signed)
 Called and left verbal ok on orders below. Ok left on secure voice mail

## 2024-06-25 NOTE — Telephone Encounter (Signed)
 Called and provided verbal okay to secure voice mail box

## 2024-06-25 NOTE — Telephone Encounter (Signed)
 Okay.  Thanks.

## 2024-06-25 NOTE — Addendum Note (Signed)
 Addended by: Skylene Deremer V on: 06/25/2024 07:27 AM   Modules accepted: Orders

## 2024-06-25 NOTE — Assessment & Plan Note (Signed)
 Anemia, iron  deficiency.  Status post recent hip surgery.  Status post recent blood transfusion.  Hematology referral to see Dr. Onesimo to consider iron  infusion.

## 2024-06-29 DIAGNOSIS — S72012D Unspecified intracapsular fracture of left femur, subsequent encounter for closed fracture with routine healing: Secondary | ICD-10-CM | POA: Diagnosis not present

## 2024-06-29 DIAGNOSIS — N1832 Chronic kidney disease, stage 3b: Secondary | ICD-10-CM | POA: Diagnosis not present

## 2024-06-29 DIAGNOSIS — S32592D Other specified fracture of left pubis, subsequent encounter for fracture with routine healing: Secondary | ICD-10-CM | POA: Diagnosis not present

## 2024-06-29 DIAGNOSIS — I10 Essential (primary) hypertension: Secondary | ICD-10-CM | POA: Diagnosis not present

## 2024-06-29 DIAGNOSIS — N179 Acute kidney failure, unspecified: Secondary | ICD-10-CM | POA: Diagnosis not present

## 2024-06-29 DIAGNOSIS — E1122 Type 2 diabetes mellitus with diabetic chronic kidney disease: Secondary | ICD-10-CM | POA: Diagnosis not present

## 2024-06-29 DIAGNOSIS — D631 Anemia in chronic kidney disease: Secondary | ICD-10-CM | POA: Diagnosis not present

## 2024-06-29 DIAGNOSIS — M47812 Spondylosis without myelopathy or radiculopathy, cervical region: Secondary | ICD-10-CM | POA: Diagnosis not present

## 2024-06-29 DIAGNOSIS — Z96642 Presence of left artificial hip joint: Secondary | ICD-10-CM | POA: Diagnosis not present

## 2024-07-02 ENCOUNTER — Encounter (HOSPITAL_BASED_OUTPATIENT_CLINIC_OR_DEPARTMENT_OTHER): Payer: Self-pay | Admitting: Family

## 2024-07-02 ENCOUNTER — Ambulatory Visit (HOSPITAL_BASED_OUTPATIENT_CLINIC_OR_DEPARTMENT_OTHER): Admitting: Family

## 2024-07-02 VITALS — BP 140/72 | HR 92 | Ht 62.0 in | Wt 110.0 lb

## 2024-07-02 DIAGNOSIS — I10 Essential (primary) hypertension: Secondary | ICD-10-CM | POA: Diagnosis not present

## 2024-07-02 DIAGNOSIS — E119 Type 2 diabetes mellitus without complications: Secondary | ICD-10-CM

## 2024-07-02 DIAGNOSIS — E782 Mixed hyperlipidemia: Secondary | ICD-10-CM | POA: Diagnosis not present

## 2024-07-02 MED ORDER — TORSEMIDE 20 MG PO TABS: 20.0000 mg | ORAL_TABLET | Freq: Every day | ORAL | 2 refills | Status: DC | PRN

## 2024-07-02 NOTE — Progress Notes (Signed)
 [facility] Assessment:    Date:  07/02/2024   ID:  Breanna [name], DOB 1931-02-20, MRN [id number]  PCP:  Garald Karlynn GAILS, MD  Cardiologist:  None  Nephrologist:  Referring MD: Garald Karlynn GAILS, MD   CC: Hypertension  History of Present Illness:    Breanna [name] is a 89 y.o. female with a hx of hypertension, constipation, DM2, gout, right adrenal adenoma, mitral and tricuspid regurgitation, diastolic dysfunction, CKDIII, coronary calcifications on CT, cerebellar CVA 2010 here to follow up in the [facility].   Previously evaluated by cardiology for atypical chest pain 02/2023. Myoview  03/2023 LVEF 55-65%, small region of apical ischemia recommended for medical management as asymtpomatic.   Prior GI bleed 01/2023 led to syncope. Her antihypertensive regimen was reduced at that time (Losartan  100mg  ? 25mg  ). Echo 01/18/24 normal LVEF 60-65%, gr2dd, RV normal, moderately elevated PASP, LA severely dilated, moderate MR, mild to moderate TR, trivial AI. Referred by PCP after labs with home health BP 180s/80s heart rate 49-54 bpm. At visit 04/16/24 with PCP BP 145/70 and HR 51 on Amlodipine  5mg  daily and Losartan  25mg  PRN SBP >160.   Established with [facility] 04/23/24. Diagnosed with BP many years prior which was difficult to control. Home readings 160s. Family often brought meals to her which were higher in sodium [facility], [employer]). BP goal set at <140/90. Amlodipine  increased to 10mg  daily. Losartan  50mg  BID continued. Advised to remain off Spironolactone  to avoid multiple medication changes.   Admitted 6/10-6/14/2024 after mechanical fall at which time her head hit the ground, briefly lost consciousness, left hip fracture s/p cemented left hip hemiarthroplasty.  Discharged on aspirin  81 mg twice daily for 28 days.  Home torsemide , losartan , spironolactone  held due to AKI.  Presents today for follow up with her son. BP at home 130-140s.  Has been working with [facility] PT once per week with good improvement in mobility. Mild intermittent ankle edema since discontinuation of Torsemide  but no exertional dyspnea, orthopnea, PND. Denies chest pain, pressure, tightness.   Previous antihypertensives: Verapamil - SOB Atenolol - fatigue Hydrochlorothiazide  gout Lisinopril - cough Valsartan - itching Spironolactone  - stopped 01/2023 in setting of hypotension    Past Medical History:  Diagnosis Date   Adrenal adenoma 12/24/2004   Boils 12/25/2007   Cholelithiasis    asympt. w/normal HIDA 03/2010   CVA (cerebral infarction) 12/24/2008   Cerebellar   Diverticulitis    Esophageal spasm 12/24/2009   GERD (gastroesophageal reflux disease)    Gout    History of colon polyps    HTN (hypertension)    Hydronephrosis    LEFT/ Surgical intervention   Hyperlipidemia    LBP (low back pain)    Osteoarthritis    Pulmonary HTN (HCC)    Stress    Type II or unspecified type diabetes mellitus without mention of complication, not stated as uncontrolled     Past Surgical History:  Procedure Laterality Date   ABDOMINAL HYSTERECTOMY     APPENDECTOMY     2020   BACK SURGERY     BREAST BIOPSY     RIGHT   CATARACT EXTRACTION, BILATERAL     COLECTOMY  2006   Sigmoid   COLONOSCOPY Left 01/20/2024   Procedure: COLONOSCOPY;  Surgeon: Abran Norleen SAILOR, MD;  Location: [facility];  Service: Gastroenterology;  Laterality: Left;   ESOPHAGOGASTRODUODENOSCOPY Left 01/19/2024   Procedure: ESOPHAGOGASTRODUODENOSCOPY (EGD);  Surgeon: San Sandor GAILS, DO;  Location: [facility];  Service: Gastroenterology;  Laterality: Left;   FOOT SURGERY     BILATERAL   HAMMER TOE SURGERY     HIP ARTHROPLASTY Left 06/03/2024   Procedure: HEMIARTHROPLASTY (BIPOLAR) HIP, POSTERIOR APPROACH FOR FRACTURE;  Surgeon: Edna Toribio LABOR, MD;  Location: [facility];  Service: Orthopedics;  Laterality: Left;  LEFT HIP HEMIARTHROPLASTY LATERAL POSITION WITH CEMENT   INTRAMEDULLARY  (IM) NAIL INTERTROCHANTERIC Right 07/21/2020   Procedure: INTRAMEDULLARY (IM) NAIL INTERTROCHANTRIC;  Surgeon: Beverley Evalene BIRCH, MD;  Location: [facility];  Service: Orthopedics;  Laterality: Right;   ROTATOR CUFF REPAIR  2008   RIGHT   TOTAL KNEE ARTHROPLASTY  2003   LEFT   VARICOSE VEIN SURGERY     vein stripping/lower extremities    Current Medications: Current Meds  Medication Sig   acetaminophen  (TYLENOL ) 325 MG tablet Take 2 tablets (650 mg total) by mouth every 4 (four) hours.   allopurinol  (ZYLOPRIM ) 100 MG tablet Take 0.5 tablets (50 mg total) by mouth every other day.   amLODipine  (NORVASC ) 10 MG tablet Take 1 tablet (10 mg total) by mouth daily.   artificial tears ophthalmic solution Place 1 drop into both eyes as needed for dry eyes.   aspirin  EC 81 MG tablet Take 1 tablet (81 mg total) by mouth 2 (two) times daily for 28 days. Swallow whole.   atropine  1 % ophthalmic solution Place 1 drop into both eyes daily.   calcium  carbonate (OSCAL) 1500 (600 Ca) MG TABS tablet Take 1,500 mg by mouth daily.   carvedilol  (COREG ) 25 MG tablet Take 0.5 tablets (12.5 mg total) by mouth 2 (two) times daily with a meal.   cholecalciferol  (CHOLECALCIFEROL ) 25 MCG tablet Take 1 tablet (1,000 Units total) by mouth daily.   docusate sodium  (COLACE) 100 MG capsule TAKE 1 CAPSULE BY MOUTH TWICE A DAY   dorzolamide -timolol  (COSOPT ) 22.3-6.8 MG/ML ophthalmic solution Place 1 drop into both eyes 2 (two) times daily.    feeding supplement (ENSURE PLUS HIGH PROTEIN) LIQD Take 237 mLs by mouth 2 (two) times daily between meals.   ferrous sulfate  325 (65 FE) MG tablet Take 1 tablet (325 mg total) by mouth daily with breakfast.   gabapentin  (NEURONTIN ) 100 MG capsule Take 1 capsule (100 mg total) by mouth at bedtime.   glucose blood (GNP TRUE METRIX GLUCOSE STRIPS) test strip Use to check blood sugars twice a day   latanoprost  (XALATAN ) 0.005 % ophthalmic solution Place 1 drop into both eyes at bedtime.     [Paused] losartan  (COZAAR ) 50 MG tablet TAKE 1 TABLET BY MOUTH EVERY DAY   [Paused] metFORMIN  (GLUCOPHAGE ) 500 MG tablet Take 500 mg by mouth daily with breakfast.   ondansetron  (ZOFRAN -ODT) 4 MG disintegrating tablet Take 1 tablet (4 mg total) by mouth every 8 (eight) hours as needed for nausea or vomiting.   pantoprazole  (PROTONIX ) 40 MG tablet TAKE 1 TABLET BY MOUTH EVERY DAY   polyethylene glycol (MIRALAX  / GLYCOLAX ) 17 g packet Take 17 g by mouth daily as needed for mild constipation.   rosuvastatin  (CRESTOR ) 5 MG tablet Take 5 mg by mouth daily.   senna (SENOKOT) 8.6 MG TABS tablet Take 1 tablet (8.6 mg total) by mouth at bedtime. While taking opioids   sertraline  (ZOLOFT ) 100 MG tablet Take 1 tablet (100 mg total) by mouth at bedtime.   [Paused] spironolactone  (ALDACTONE ) 25 MG tablet Take 25 mg by mouth 3 (three) times a week.   [Paused] torsemide  (DEMADEX ) 20 MG tablet Take 20 mg by  mouth daily.     Allergies:   Verapamil, Aspirin , Atenolol, Codeine, Codeine sulfate, Hydrochlorothiazide, Hydrocodone , Ibuprofen , Lisinopril, Onion, Shellfish allergy, Statins, Valsartan, Adhesive  [tape], and Other   Social History   Socioeconomic History   Marital status: Widowed    Spouse name: Cerise Lieber   Number of children: 1   Years of education: Not on file   Highest education level: 12th grade  Occupational History   Occupation: Retired    Associate Professor: RETIRED  Tobacco Use   Smoking status: Never   Smokeless tobacco: Never  Vaping Use   Vaping status: Never Used  Substance and Sexual Activity   Alcohol  use: No    Alcohol /week: 0.0 standard drinks of alcohol    Drug use: No   Sexual activity: Not Currently  Other Topics Concern   Not on file  Social History Narrative   Lives with son.   Social Drivers of Corporate investment banker Strain: Low Risk  (02/12/2024)   Overall Financial Resource Strain (CARDIA)    Difficulty of Paying Living Expenses: Not hard at all  Food  Insecurity: No Food Insecurity (06/03/2024)   Hunger Vital Sign    Worried About Running Out of Food in the Last Year: Never true    Ran Out of Food in the Last Year: Never true  Transportation Needs: No Transportation Needs (06/03/2024)   PRAPARE - Administrator, Civil Service (Medical): No    Lack of Transportation (Non-Medical): No  Physical Activity: Inactive (02/12/2024)   Exercise Vital Sign    Days of Exercise per Week: 0 days    Minutes of Exercise per Session: 0 min  Stress: No Stress Concern Present (04/23/2024)   [employer] of Occupational Health - Occupational Stress Questionnaire    Feeling of Stress : Not at all  Social Connections: Moderately Integrated (06/03/2024)   Social Connection and Isolation Panel    Frequency of Communication with Friends and Family: More than three times a week    Frequency of Social Gatherings with Friends and Family: More than three times a week    Attends Religious Services: More than 4 times per year    Active Member of [employer] or Organizations: Yes    Attends Banker Meetings: Patient declined    Marital Status: Widowed     Family History: The patient's family history includes Breast cancer in her daughter; Cancer in her daughter, father, and maternal uncle; Diabetes in her mother and another family member; Heart disease in her mother and another family member; Hypertension in her father and another family member; Prostate cancer in her father and maternal uncle. There is no history of Colon cancer.  ROS:   Please see the history of present illness.     All other systems reviewed and are negative.  EKGs/Labs/Other Studies Reviewed:         Recent Labs: 06/06/2024: Magnesium  2.0 06/24/2024: ALT 8; BUN 22; Creatinine, Ser 1.24; Hemoglobin 9.4; Platelets 195.0; Potassium 4.6; Sodium 141; TSH 1.47   Recent Lipid Panel    Component Value Date/Time   CHOL 127 03/01/2023 0641   TRIG 42 03/01/2023 0641   TRIG 55  11/21/2006 0851   HDL 53 03/01/2023 0641   CHOLHDL 2.4 03/01/2023 0641   VLDL 8 03/01/2023 0641   LDLCALC 66 03/01/2023 0641    Physical Exam:   VS:  BP (!) 152/85 (BP Location: Left Arm, Patient Position: Sitting)   Pulse (!) 48   Ht 5'  2 (1.575 m)   Wt 110 lb (49.9 kg)   SpO2 99%   BMI 20.12 kg/m  , BMI Body mass index is 20.12 kg/m. GENERAL:  Well appearing HEENT: Pupils equal round and reactive, fundi not visualized, oral mucosa unremarkable NECK:  No jugular venous distention, waveform within normal limits, carotid upstroke brisk and symmetric, no bruits, no thyromegaly LYMPHATICS:  No cervical adenopathy LUNGS:  Clear to auscultation bilaterally HEART:  RRR.  PMI not displaced or sustained,S1 and S2 within normal limits, no S3, no S4, no clicks, no rubs, no murmurs ABD:  Flat, positive bowel sounds normal in frequency in pitch, no bruits, no rebound, no guarding, no midline pulsatile mass, no hepatomegaly, no splenomegaly EXT:  2 plus pulses throughout, no edema, no cyanosis no clubbing SKIN:  No rashes no nodules NEURO:  Cranial nerves II through XII grossly intact, motor grossly intact throughout PSYCH:  Cognitively intact, oriented to person place and time   ASSESSMENT/PLAN:    HTN - BP goal <140/90 given age, body habitus. SBP at home 130-140.  Continue Amlodipine  to 10mg  daily, Carvedilol  12.5mg  BID. Remain off Losartan , Spironolactone  as BP controlled without these agents (were held during admission for AKI). Change Torsemide  to 20mg  PRN for ankle swelling.  Secondary workup 08/2023 normal TSH Low suspicion hyperaldosteronism as no persistent hypokalemia Defer renal artery duplex today as low suspicion she would want intervention due to age if significant renal stenosis noted  HLD - continue Crestor  5mg  daily. Managed per PCP team  DM2 - Metformin  held during recent admission due to AKI. Will reach out to PCP to inquire if she is to resume.  Screening for  Secondary Hypertension:     04/23/2024   10:12 AM  Causes  Drugs/Herbals Screened     - Comments no excessive caffeine  Thyroid  Disease Screened     - Comments 08/2023 normal TSH  Pheochromocytoma N/A     - Comments adrenal adenoma with no pheochromocytoma  by prior CT abd  Cushing's Syndrome N/A     - Comments non cushingoid appearance  Compliance Screened     - Comments family assists with medications    Relevant Labs/Studies:    Latest Ref Rng & Units 06/24/2024   12:26 PM 06/06/2024    6:10 AM 06/05/2024    6:46 AM  Basic Labs  Sodium 135 - 145 mEq/L 141  138  138   Potassium 3.5 - 5.1 mEq/L 4.6  3.6  3.8   Creatinine 0.40 - 1.20 mg/dL 8.75  8.56  8.42        Latest Ref Rng & Units 06/24/2024   12:26 PM 09/11/2023    9:15 AM  Thyroid    TSH 0.35 - 5.50 uIU/mL 1.47  0.54                   Disposition:    FU with [facility] PRN   Medication Adjustments/Labs and Tests Ordered: Current medicines are reviewed at length with the patient today.  Concerns regarding medicines are outlined above.  No orders of the defined types were placed in this encounter.  No orders of the defined types were placed in this encounter.    Signed, Reche GORMAN Finder, NP  07/02/2024 1:33 PM    [facility]

## 2024-07-02 NOTE — Patient Instructions (Addendum)
 Medication Instructions:  Your physician has recommended you make the following change in your medication:   Stay off Spironolactone  and losartan    Change Torsemide  to only as needed for swelling   Continue Amlodipine   Continue carvedilol    We will ask about metformin     Follow-Up: Call us  if you need us     Special Instructions:   Your blood pressure goal is less than 140/90   Recommend calling hematology if you don't hear from them about iron  infusions by Wednesday of next week

## 2024-07-03 ENCOUNTER — Telehealth: Payer: Self-pay

## 2024-07-03 DIAGNOSIS — I10 Essential (primary) hypertension: Secondary | ICD-10-CM | POA: Diagnosis not present

## 2024-07-03 DIAGNOSIS — Z96642 Presence of left artificial hip joint: Secondary | ICD-10-CM | POA: Diagnosis not present

## 2024-07-03 DIAGNOSIS — N1832 Chronic kidney disease, stage 3b: Secondary | ICD-10-CM | POA: Diagnosis not present

## 2024-07-03 DIAGNOSIS — D631 Anemia in chronic kidney disease: Secondary | ICD-10-CM | POA: Diagnosis not present

## 2024-07-03 DIAGNOSIS — M47812 Spondylosis without myelopathy or radiculopathy, cervical region: Secondary | ICD-10-CM | POA: Diagnosis not present

## 2024-07-03 DIAGNOSIS — S72012D Unspecified intracapsular fracture of left femur, subsequent encounter for closed fracture with routine healing: Secondary | ICD-10-CM | POA: Diagnosis not present

## 2024-07-03 DIAGNOSIS — N179 Acute kidney failure, unspecified: Secondary | ICD-10-CM | POA: Diagnosis not present

## 2024-07-03 DIAGNOSIS — E1122 Type 2 diabetes mellitus with diabetic chronic kidney disease: Secondary | ICD-10-CM | POA: Diagnosis not present

## 2024-07-03 DIAGNOSIS — S32592D Other specified fracture of left pubis, subsequent encounter for fracture with routine healing: Secondary | ICD-10-CM | POA: Diagnosis not present

## 2024-07-03 NOTE — Telephone Encounter (Signed)
 Copied from CRM [id number]. Topic: Clinical - Medical Advice >> Jul 03, 2024 12:53 PM Maisie C wrote: Reason for CRM: Signe PT with [facility] health called to inform pcp that the patient has not been sleeping well and she had an episode / mild hallucinations, Tuesday & Wednesday night. Her son, Salisha [facility]) [phone], says this happens every 3 months or so. When this happens, they give her one tylenol  & one tylenol  PM. Signe wants to confirm if it's okay she takes the Tylenol  PM because it's not on her med list.  Additionally, her family were told she needs a iron  infusion and when they attempted to get it scheduled It was delayed. They asked if Dr. Garald could help assist.    Jim's contact number is [phone].

## 2024-07-06 ENCOUNTER — Ambulatory Visit: Payer: Self-pay

## 2024-07-06 DIAGNOSIS — M47812 Spondylosis without myelopathy or radiculopathy, cervical region: Secondary | ICD-10-CM | POA: Diagnosis not present

## 2024-07-06 DIAGNOSIS — M19012 Primary osteoarthritis, left shoulder: Secondary | ICD-10-CM

## 2024-07-06 DIAGNOSIS — S72012D Unspecified intracapsular fracture of left femur, subsequent encounter for closed fracture with routine healing: Secondary | ICD-10-CM | POA: Diagnosis not present

## 2024-07-06 DIAGNOSIS — H401133 Primary open-angle glaucoma, bilateral, severe stage: Secondary | ICD-10-CM | POA: Diagnosis not present

## 2024-07-06 DIAGNOSIS — N179 Acute kidney failure, unspecified: Secondary | ICD-10-CM | POA: Diagnosis not present

## 2024-07-06 DIAGNOSIS — Z96642 Presence of left artificial hip joint: Secondary | ICD-10-CM | POA: Diagnosis not present

## 2024-07-06 DIAGNOSIS — M109 Gout, unspecified: Secondary | ICD-10-CM

## 2024-07-06 DIAGNOSIS — D631 Anemia in chronic kidney disease: Secondary | ICD-10-CM | POA: Diagnosis not present

## 2024-07-06 DIAGNOSIS — I10 Essential (primary) hypertension: Secondary | ICD-10-CM | POA: Diagnosis not present

## 2024-07-06 DIAGNOSIS — M1812 Unilateral primary osteoarthritis of first carpometacarpal joint, left hand: Secondary | ICD-10-CM

## 2024-07-06 DIAGNOSIS — S32592D Other specified fracture of left pubis, subsequent encounter for fracture with routine healing: Secondary | ICD-10-CM | POA: Diagnosis not present

## 2024-07-06 DIAGNOSIS — N1832 Chronic kidney disease, stage 3b: Secondary | ICD-10-CM | POA: Diagnosis not present

## 2024-07-06 DIAGNOSIS — E1122 Type 2 diabetes mellitus with diabetic chronic kidney disease: Secondary | ICD-10-CM | POA: Diagnosis not present

## 2024-07-06 NOTE — Telephone Encounter (Signed)
  FYI Only or Action Required?: Action required by provider: referral request and clinical question for provider.  Patient was last seen in primary care on 06/24/2024 by Plotnikov, Karlynn GAILS, MD.  Called Nurse Triage reporting Fatigue and Advice Only.  Symptoms began no triage required.  Interventions attempted: Other: no triage required.  Symptoms are: no triage required.  Triage Disposition: Call PCP When Office is Open  Patient/caregiver understands and will follow disposition?: Yes                             Copied from CRM [id number]. Topic: Clinical - Red Word Triage >> Jul 06, 2024  3:07 PM Breanna [name] wrote: Red Word that prompted transfer to Nurse Triage: pt is having lack of energy from low iron  Reason for Disposition  [1] Caller requesting NON-URGENT health information AND [2] PCP's office is the best resource  Answer Assessment - Initial Assessment Questions 1. REASON FOR CALL: What is the main reason for your call? or How can I best help you?     Patient's son, Breanna [name], is seeking an update regarding a referral for an iron  infusion. Please advise.  Protocols used: Information Only Call - No Triage-A-AH

## 2024-07-06 NOTE — Telephone Encounter (Signed)
 LVM for Signe per PCP  It is okay to give Tylenol  PM if they do not think it is related to her confusion or other problems. If they think there are negative side effects with Tylenol  PM, please stop Tylenol  PM. Jannae can take regular Tylenol  500 mg at night instead. Thank you

## 2024-07-06 NOTE — Telephone Encounter (Signed)
 It is okay to give Tylenol  PM if they do not think it is related to her confusion or other problems. If they think there are negative side effects with Tylenol  PM, please stop Tylenol  PM. Breanna [name] can take regular Tylenol  500 mg at night instead. Thank you

## 2024-07-07 ENCOUNTER — Other Ambulatory Visit (HOSPITAL_BASED_OUTPATIENT_CLINIC_OR_DEPARTMENT_OTHER): Payer: Self-pay | Admitting: Family

## 2024-07-07 ENCOUNTER — Telehealth: Payer: Self-pay | Admitting: Internal Medicine

## 2024-07-07 DIAGNOSIS — Z96642 Presence of left artificial hip joint: Secondary | ICD-10-CM | POA: Diagnosis not present

## 2024-07-07 DIAGNOSIS — E1122 Type 2 diabetes mellitus with diabetic chronic kidney disease: Secondary | ICD-10-CM | POA: Diagnosis not present

## 2024-07-07 DIAGNOSIS — S72012D Unspecified intracapsular fracture of left femur, subsequent encounter for closed fracture with routine healing: Secondary | ICD-10-CM | POA: Diagnosis not present

## 2024-07-07 DIAGNOSIS — S32592D Other specified fracture of left pubis, subsequent encounter for fracture with routine healing: Secondary | ICD-10-CM | POA: Diagnosis not present

## 2024-07-07 DIAGNOSIS — N1832 Chronic kidney disease, stage 3b: Secondary | ICD-10-CM | POA: Diagnosis not present

## 2024-07-07 DIAGNOSIS — N179 Acute kidney failure, unspecified: Secondary | ICD-10-CM | POA: Diagnosis not present

## 2024-07-07 DIAGNOSIS — I10 Essential (primary) hypertension: Secondary | ICD-10-CM | POA: Diagnosis not present

## 2024-07-07 DIAGNOSIS — D631 Anemia in chronic kidney disease: Secondary | ICD-10-CM | POA: Diagnosis not present

## 2024-07-07 DIAGNOSIS — M47812 Spondylosis without myelopathy or radiculopathy, cervical region: Secondary | ICD-10-CM | POA: Diagnosis not present

## 2024-07-07 NOTE — Telephone Encounter (Signed)
 Copied from CRM [id number]. Topic: Clinical - Home Health Verbal Orders >> Jul 07, 2024  3:10 PM [place] wrote: [facility]- Patient has been discharged from skilled nursing due to no loner needing the services.

## 2024-07-08 NOTE — Telephone Encounter (Signed)
 Noted! Thank you

## 2024-07-09 DIAGNOSIS — I10 Essential (primary) hypertension: Secondary | ICD-10-CM | POA: Diagnosis not present

## 2024-07-09 DIAGNOSIS — N179 Acute kidney failure, unspecified: Secondary | ICD-10-CM | POA: Diagnosis not present

## 2024-07-09 DIAGNOSIS — M47812 Spondylosis without myelopathy or radiculopathy, cervical region: Secondary | ICD-10-CM | POA: Diagnosis not present

## 2024-07-09 DIAGNOSIS — D631 Anemia in chronic kidney disease: Secondary | ICD-10-CM | POA: Diagnosis not present

## 2024-07-09 DIAGNOSIS — S32592D Other specified fracture of left pubis, subsequent encounter for fracture with routine healing: Secondary | ICD-10-CM | POA: Diagnosis not present

## 2024-07-09 DIAGNOSIS — E1122 Type 2 diabetes mellitus with diabetic chronic kidney disease: Secondary | ICD-10-CM | POA: Diagnosis not present

## 2024-07-09 DIAGNOSIS — S72012D Unspecified intracapsular fracture of left femur, subsequent encounter for closed fracture with routine healing: Secondary | ICD-10-CM | POA: Diagnosis not present

## 2024-07-09 DIAGNOSIS — N1832 Chronic kidney disease, stage 3b: Secondary | ICD-10-CM | POA: Diagnosis not present

## 2024-07-09 DIAGNOSIS — Z96642 Presence of left artificial hip joint: Secondary | ICD-10-CM | POA: Diagnosis not present

## 2024-07-10 ENCOUNTER — Other Ambulatory Visit: Payer: Self-pay | Admitting: Internal Medicine

## 2024-07-10 MED ORDER — CARVEDILOL 12.5 MG PO TABS: 12.5000 mg | ORAL_TABLET | Freq: Two times a day (BID) | ORAL | 11 refills | Status: AC

## 2024-07-10 NOTE — Progress Notes (Signed)
 Coreg  Rx was emailed

## 2024-07-14 ENCOUNTER — Ambulatory Visit: Admitting: Podiatry

## 2024-07-14 ENCOUNTER — Other Ambulatory Visit

## 2024-07-14 DIAGNOSIS — S72042D Displaced fracture of base of neck of left femur, subsequent encounter for closed fracture with routine healing: Secondary | ICD-10-CM | POA: Diagnosis not present

## 2024-07-15 DIAGNOSIS — N1832 Chronic kidney disease, stage 3b: Secondary | ICD-10-CM | POA: Diagnosis not present

## 2024-07-15 DIAGNOSIS — N179 Acute kidney failure, unspecified: Secondary | ICD-10-CM | POA: Diagnosis not present

## 2024-07-15 DIAGNOSIS — S72012D Unspecified intracapsular fracture of left femur, subsequent encounter for closed fracture with routine healing: Secondary | ICD-10-CM | POA: Diagnosis not present

## 2024-07-15 DIAGNOSIS — I10 Essential (primary) hypertension: Secondary | ICD-10-CM | POA: Diagnosis not present

## 2024-07-15 DIAGNOSIS — E1122 Type 2 diabetes mellitus with diabetic chronic kidney disease: Secondary | ICD-10-CM | POA: Diagnosis not present

## 2024-07-15 DIAGNOSIS — Z96642 Presence of left artificial hip joint: Secondary | ICD-10-CM | POA: Diagnosis not present

## 2024-07-15 DIAGNOSIS — S32592D Other specified fracture of left pubis, subsequent encounter for fracture with routine healing: Secondary | ICD-10-CM | POA: Diagnosis not present

## 2024-07-15 DIAGNOSIS — M47812 Spondylosis without myelopathy or radiculopathy, cervical region: Secondary | ICD-10-CM | POA: Diagnosis not present

## 2024-07-15 DIAGNOSIS — D631 Anemia in chronic kidney disease: Secondary | ICD-10-CM | POA: Diagnosis not present

## 2024-07-16 ENCOUNTER — Ambulatory Visit: Admitting: Internal Medicine

## 2024-07-20 ENCOUNTER — Other Ambulatory Visit: Payer: Self-pay | Admitting: Internal Medicine

## 2024-07-20 DIAGNOSIS — S32592D Other specified fracture of left pubis, subsequent encounter for fracture with routine healing: Secondary | ICD-10-CM | POA: Diagnosis not present

## 2024-07-20 DIAGNOSIS — M47812 Spondylosis without myelopathy or radiculopathy, cervical region: Secondary | ICD-10-CM | POA: Diagnosis not present

## 2024-07-20 DIAGNOSIS — S72012D Unspecified intracapsular fracture of left femur, subsequent encounter for closed fracture with routine healing: Secondary | ICD-10-CM | POA: Diagnosis not present

## 2024-07-20 DIAGNOSIS — I10 Essential (primary) hypertension: Secondary | ICD-10-CM | POA: Diagnosis not present

## 2024-07-20 DIAGNOSIS — E1122 Type 2 diabetes mellitus with diabetic chronic kidney disease: Secondary | ICD-10-CM | POA: Diagnosis not present

## 2024-07-20 DIAGNOSIS — Z96642 Presence of left artificial hip joint: Secondary | ICD-10-CM | POA: Diagnosis not present

## 2024-07-20 DIAGNOSIS — N179 Acute kidney failure, unspecified: Secondary | ICD-10-CM | POA: Diagnosis not present

## 2024-07-20 DIAGNOSIS — D631 Anemia in chronic kidney disease: Secondary | ICD-10-CM | POA: Diagnosis not present

## 2024-07-20 DIAGNOSIS — N1832 Chronic kidney disease, stage 3b: Secondary | ICD-10-CM | POA: Diagnosis not present

## 2024-07-23 DIAGNOSIS — K219 Gastro-esophageal reflux disease without esophagitis: Secondary | ICD-10-CM | POA: Diagnosis not present

## 2024-07-23 DIAGNOSIS — E1142 Type 2 diabetes mellitus with diabetic polyneuropathy: Secondary | ICD-10-CM | POA: Diagnosis not present

## 2024-07-23 DIAGNOSIS — E785 Hyperlipidemia, unspecified: Secondary | ICD-10-CM | POA: Diagnosis not present

## 2024-07-23 DIAGNOSIS — I509 Heart failure, unspecified: Secondary | ICD-10-CM | POA: Diagnosis not present

## 2024-07-23 DIAGNOSIS — E1122 Type 2 diabetes mellitus with diabetic chronic kidney disease: Secondary | ICD-10-CM | POA: Diagnosis not present

## 2024-07-23 DIAGNOSIS — E1151 Type 2 diabetes mellitus with diabetic peripheral angiopathy without gangrene: Secondary | ICD-10-CM | POA: Diagnosis not present

## 2024-07-23 DIAGNOSIS — I130 Hypertensive heart and chronic kidney disease with heart failure and stage 1 through stage 4 chronic kidney disease, or unspecified chronic kidney disease: Secondary | ICD-10-CM | POA: Diagnosis not present

## 2024-07-23 DIAGNOSIS — I700 Atherosclerosis of aorta: Secondary | ICD-10-CM | POA: Diagnosis not present

## 2024-07-23 DIAGNOSIS — N1832 Chronic kidney disease, stage 3b: Secondary | ICD-10-CM | POA: Diagnosis not present

## 2024-07-24 ENCOUNTER — Ambulatory Visit (INDEPENDENT_AMBULATORY_CARE_PROVIDER_SITE_OTHER): Admitting: Internal Medicine

## 2024-07-24 VITALS — BP 148/80 | HR 49 | Temp 98.1°F | Ht 62.0 in | Wt 107.0 lb

## 2024-07-24 DIAGNOSIS — E1121 Type 2 diabetes mellitus with diabetic nephropathy: Secondary | ICD-10-CM | POA: Diagnosis not present

## 2024-07-24 DIAGNOSIS — R159 Full incontinence of feces: Secondary | ICD-10-CM | POA: Diagnosis not present

## 2024-07-24 DIAGNOSIS — R195 Other fecal abnormalities: Secondary | ICD-10-CM

## 2024-07-24 DIAGNOSIS — R197 Diarrhea, unspecified: Secondary | ICD-10-CM | POA: Diagnosis not present

## 2024-07-24 DIAGNOSIS — R269 Unspecified abnormalities of gait and mobility: Secondary | ICD-10-CM | POA: Diagnosis not present

## 2024-07-24 DIAGNOSIS — H5316 Psychophysical visual disturbances: Secondary | ICD-10-CM

## 2024-07-24 DIAGNOSIS — Z7984 Long term (current) use of oral hypoglycemic drugs: Secondary | ICD-10-CM

## 2024-07-24 DIAGNOSIS — R5383 Other fatigue: Secondary | ICD-10-CM

## 2024-07-24 DIAGNOSIS — D62 Acute posthemorrhagic anemia: Secondary | ICD-10-CM

## 2024-07-24 LAB — CBC WITH DIFFERENTIAL/PLATELET
Basophils Absolute: 0 K/uL (ref 0.0–0.1)
Basophils Relative: 0.5 % (ref 0.0–3.0)
Eosinophils Absolute: 0.1 K/uL (ref 0.0–0.7)
Eosinophils Relative: 3.1 % (ref 0.0–5.0)
HCT: 25.7 % — ABNORMAL LOW (ref 36.0–46.0)
Hemoglobin: 8.3 g/dL — ABNORMAL LOW (ref 12.0–15.0)
Lymphocytes Relative: 16.7 % (ref 12.0–46.0)
Lymphs Abs: 0.8 K/uL (ref 0.7–4.0)
MCHC: 32.1 g/dL (ref 30.0–36.0)
MCV: 87.6 fl (ref 78.0–100.0)
Monocytes Absolute: 0.4 K/uL (ref 0.1–1.0)
Monocytes Relative: 9.3 % (ref 3.0–12.0)
Neutro Abs: 3.2 K/uL (ref 1.4–7.7)
Neutrophils Relative %: 70.4 % (ref 43.0–77.0)
Platelets: 149 K/uL — ABNORMAL LOW (ref 150.0–400.0)
RBC: 2.94 Mil/uL — ABNORMAL LOW (ref 3.87–5.11)
RDW: 17.8 % — ABNORMAL HIGH (ref 11.5–15.5)
WBC: 4.5 K/uL (ref 4.0–10.5)

## 2024-07-24 NOTE — Assessment & Plan Note (Addendum)
 Daily (1-2/d) Loose dark stools x 2 days. We are not sure if taking po iron  or not. On Pantaprazole. Hem appt w/Dr Onesimo on Monday

## 2024-07-24 NOTE — Assessment & Plan Note (Signed)
 Occasional stool incontinence

## 2024-07-24 NOTE — Assessment & Plan Note (Signed)
 No change

## 2024-07-24 NOTE — Assessment & Plan Note (Signed)
No recent relapse 

## 2024-07-24 NOTE — Assessment & Plan Note (Signed)
 In w/c

## 2024-07-24 NOTE — Progress Notes (Signed)
 Subjective:  Patient ID: Breanna [name], female    DOB: 1931/01/31  Age: 89 y.o. MRN: [id number]  CC: Medical Management of Chronic Issues (2 mnth f/u, Pt son has stated pt has had very dark stool x2 days)   HPI [facility] presents for HTN, CHF, blindness, DM, anemia C/o dark stools x 2 days. We are not sure if taking po iron  or not. On Pantaprazole. Hem appt w/Dr Onesimo on Monday Here w/Tony  Outpatient Medications Prior to Visit  Medication Sig Dispense Refill   acetaminophen  (TYLENOL ) 325 MG tablet Take 2 tablets (650 mg total) by mouth every 4 (four) hours.     allopurinol  (ZYLOPRIM ) 100 MG tablet Take 0.5 tablets (50 mg total) by mouth every other day.     amLODipine  (NORVASC ) 10 MG tablet Take 1 tablet (10 mg total) by mouth daily. 90 tablet 3   artificial tears ophthalmic solution Place 1 drop into both eyes as needed for dry eyes.     atropine  1 % ophthalmic solution Place 1 drop into both eyes daily.     calcium  carbonate (OSCAL) 1500 (600 Ca) MG TABS tablet Take 1,500 mg by mouth daily.     carvedilol  (COREG ) 12.5 MG tablet Take 1 tablet (12.5 mg total) by mouth 2 (two) times daily with a meal. 60 tablet 11   cholecalciferol  (CHOLECALCIFEROL ) 25 MCG tablet Take 1 tablet (1,000 Units total) by mouth daily.     [employer] VITAMIN B12 1000 MCG tablet TAKE 1 TABLET (1,000 MCG TOTAL) BY MOUTH EVERY DAY 90 tablet 3   docusate sodium  (COLACE) 100 MG capsule TAKE 1 CAPSULE BY MOUTH TWICE A DAY 180 capsule 3   dorzolamide -timolol  (COSOPT ) 22.3-6.8 MG/ML ophthalmic solution Place 1 drop into both eyes 2 (two) times daily.      feeding supplement (ENSURE PLUS HIGH PROTEIN) LIQD Take 237 mLs by mouth 2 (two) times daily between meals.     ferrous sulfate  325 (65 FE) MG tablet Take 1 tablet (325 mg total) by mouth daily with breakfast.     gabapentin  (NEURONTIN ) 100 MG capsule Take 1 capsule (100 mg total) by mouth at bedtime.     glucose blood (GNP TRUE METRIX GLUCOSE STRIPS) test strip Use to  check blood sugars twice a day 200 each 2   latanoprost  (XALATAN ) 0.005 % ophthalmic solution Place 1 drop into both eyes at bedtime.      metFORMIN  (GLUCOPHAGE ) 500 MG tablet Take 500 mg by mouth daily with breakfast.     ondansetron  (ZOFRAN -ODT) 4 MG disintegrating tablet Take 1 tablet (4 mg total) by mouth every 8 (eight) hours as needed for nausea or vomiting.     pantoprazole  (PROTONIX ) 40 MG tablet TAKE 1 TABLET BY MOUTH EVERY DAY 90 tablet 3   polyethylene glycol (MIRALAX  / GLYCOLAX ) 17 g packet Take 17 g by mouth daily as needed for mild constipation.     rosuvastatin  (CRESTOR ) 5 MG tablet Take 5 mg by mouth daily.     senna (SENOKOT) 8.6 MG TABS tablet Take 1 tablet (8.6 mg total) by mouth at bedtime. While taking opioids     sertraline  (ZOLOFT ) 100 MG tablet Take 1 tablet (100 mg total) by mouth at bedtime. 90 tablet 1   spironolactone  (ALDACTONE ) 25 MG tablet Take 25 mg by mouth 3 (three) times a week.     torsemide  (DEMADEX ) 20 MG tablet Take 1 tablet (20 mg total) by mouth daily as needed. Take if needed once per  day for lower extremity swelling. If you are having to take more than twice per week, please call and let us  know 30 tablet 2   No facility-administered medications prior to visit.    ROS: Review of Systems  Constitutional:  Negative for activity change, appetite change, chills, fatigue and unexpected weight change.  HENT:  Negative for congestion, mouth sores and sinus pressure.   Eyes:  Negative for visual disturbance.  Respiratory:  Negative for cough and chest tightness.   Cardiovascular:  Negative for palpitations and leg swelling.  Gastrointestinal:  Positive for diarrhea. Negative for abdominal distention, abdominal pain, anal bleeding, blood in stool, constipation, nausea and vomiting.  Genitourinary:  Negative for difficulty urinating, frequency and vaginal pain.  Musculoskeletal:  Positive for back pain and gait problem.  Skin:  Negative for pallor and rash.   Neurological:  Negative for dizziness, tremors, weakness, numbness and headaches.  Hematological:  Bruises/bleeds easily.  Psychiatric/Behavioral:  Negative for confusion, sleep disturbance and suicidal ideas. The patient is not nervous/anxious.     Objective:  BP (!) 148/80   Pulse (!) 49   Temp 98.1 F (36.7 C) (Oral)   Ht 5' 2 (1.575 m)   Wt 107 lb (48.5 kg)   SpO2 100%   BMI 19.57 kg/m   BP Readings from Last 3 Encounters:  07/24/24 (!) 148/80  07/02/24 (!) 140/72  06/24/24 130/62    Wt Readings from Last 3 Encounters:  07/24/24 107 lb (48.5 kg)  07/02/24 110 lb (49.9 kg)  06/03/24 109 lb (49.4 kg)    Physical Exam Constitutional:      General: She is not in acute distress.    Appearance: Normal appearance. She is well-developed.  HENT:     Head: Normocephalic.     Right Ear: External ear normal.     Left Ear: External ear normal.     Nose: Nose normal.  Eyes:     General:        Right eye: No discharge.        Left eye: No discharge.     Conjunctiva/sclera: Conjunctivae normal.     Pupils: Pupils are equal, round, and reactive to light.  Neck:     Thyroid : No thyromegaly.     Vascular: No JVD.     Trachea: No tracheal deviation.  Cardiovascular:     Rate and Rhythm: Normal rate and regular rhythm.     Heart sounds: Normal heart sounds.  Pulmonary:     Effort: No respiratory distress.     Breath sounds: No stridor. No wheezing.  Abdominal:     General: Bowel sounds are normal. There is no distension.     Palpations: Abdomen is soft. There is no mass.     Tenderness: There is no abdominal tenderness. There is no guarding or rebound.  Musculoskeletal:        General: No tenderness.     Cervical back: Normal range of motion and neck supple. No rigidity.     Right lower leg: No edema.     Left lower leg: No edema.  Lymphadenopathy:     Cervical: No cervical adenopathy.  Skin:    Findings: No erythema or rash.  Neurological:     Mental Status:  Mental status is at baseline.     Cranial Nerves: No cranial nerve deficit.     Motor: No abnormal muscle tone.     Coordination: Coordination normal.     Gait: Gait normal.  Deep Tendon Reflexes: Reflexes normal.  Psychiatric:        Behavior: Behavior normal.        Thought Content: Thought content normal.        Judgment: Judgment normal.     Lab Results  Component Value Date   WBC 4.5 06/24/2024   HGB 9.4 (L) 06/24/2024   HCT 29.1 (L) 06/24/2024   PLT 195.0 06/24/2024   GLUCOSE 127 (H) 06/24/2024   CHOL 127 03/01/2023   TRIG 42 03/01/2023   HDL 53 03/01/2023   LDLCALC 66 03/01/2023   ALT 8 06/24/2024   AST 18 06/24/2024   NA 141 06/24/2024   K 4.6 06/24/2024   CL 106 06/24/2024   CREATININE 1.24 (H) 06/24/2024   BUN 22 06/24/2024   CO2 29 06/24/2024   TSH 1.47 06/24/2024   HGBA1C 6.3 06/24/2024    CT HIP LEFT WO CONTRAST Result Date: 06/02/2024 CLINICAL DATA:  Hip trauma, fracture suspected. EXAM: CT OF THE LEFT HIP WITHOUT CONTRAST TECHNIQUE: Multidetector CT imaging of the left hip was performed according to the standard protocol. Multiplanar CT image reconstructions were also generated. RADIATION DOSE REDUCTION: This exam was performed according to the departmental dose-optimization program which includes automated exposure control, adjustment of the mA and/or kV according to patient size and/or use of iterative reconstruction technique. COMPARISON:  Pelvic and left hip radiographs same date. CT of the right hip 07/20/2020. PET-CT 09/02/2017. FINDINGS: Bones/Joint/Cartilage The technologist deviated from protocol and saved axial images through the entire pelvis. The usual small field-of-view coronal and sagittal images through the left hip are provided. The bones are mildly demineralized. There is a mildly impacted subcapital fracture of the left femoral neck. There are nondisplaced acute parasymphyseal pubic rami fractures on the left. There are sacroiliac degenerative  changes bilaterally without diastasis of the sacroiliac joints or symphysis pubis. Status post proximal right femoral ORIF with a healed fracture and posttraumatic deformity. Previous L4-5 fusion with multilevel lumbar spondylosis. Ligaments Suboptimally assessed by CT. Muscles and Tendons The pelvic and left hip musculature appears normal. Soft tissues Generalized subcutaneous edema throughout the pelvic fat with mild asymmetric involvement lateral to the proximal left femur. No focal hematoma, unexpected foreign body or soft tissue emphysema demonstrated. There are postsurgical changes within the low anterior abdominal wall. Calcified gallstones noted. IMPRESSION: 1. Mildly impacted subcapital fracture of the left femoral neck. 2. Nondisplaced acute parasymphyseal pubic rami fractures on the left. 3. Generalized subcutaneous edema throughout the pelvic fat with mild asymmetric involvement lateral to the proximal left femur. No focal hematoma, unexpected foreign body or soft tissue emphysema demonstrated. 4. Cholelithiasis. Electronically Signed   By: Elsie Perone M.D.   On: 06/02/2024 17:00   DG Wrist Complete Left Result Date: 06/02/2024 CLINICAL DATA:  Left wrist pain after fall today. EXAM: LEFT WRIST - COMPLETE 3+ VIEW COMPARISON:  None Available. FINDINGS: There is no evidence of fracture or dislocation. Severe degenerative changes seen involving the first carpometacarpal joint. Soft tissues are unremarkable. IMPRESSION: Severe osteoarthritis of first carpometacarpal joint. No acute abnormality seen. Electronically Signed   By: Lynwood Landy Raddle M.D.   On: 06/02/2024 15:43   DG Shoulder Left Result Date: 06/02/2024 CLINICAL DATA:  Left shoulder pain after fall today. EXAM: LEFT SHOULDER - 2+ VIEW COMPARISON:  None Available. FINDINGS: There is no evidence of fracture or dislocation. Moderate degenerative changes seen involving the left acromioclavicular joint. Soft tissues are unremarkable. IMPRESSION:  Moderate degenerative joint disease of left acromioclavicular  joint. No acute abnormality seen. Electronically Signed   By: Lynwood Landy Raddle M.D.   On: 06/02/2024 15:41   DG Knee Complete 4 Views Left Result Date: 06/02/2024 CLINICAL DATA:  Status post fall EXAM: LEFT KNEE - COMPLETE 4+ VIEW COMPARISON:  January 17, 2011 FINDINGS: Total left knee arthroplasty with near anatomic alignment No fractures Minimal suprapatellar joint effusion and infrapatellar swelling IMPRESSION: Total left knee arthroplasty with near anatomic alignment. Electronically Signed   By: Franky Chard M.D.   On: 06/02/2024 15:39   DG Hip Unilat W or Wo Pelvis 2-3 Views Left Result Date: 06/02/2024 CLINICAL DATA:  Left hip pain after fall. EXAM: DG HIP (WITH OR WITHOUT PELVIS) 2-3V LEFT COMPARISON:  July 20, 2020. FINDINGS: Status post surgical internal fixation of old proximal right femoral fracture. Probable mildly displaced subcapital fracture of proximal left femur is noted. No dislocation is noted. IMPRESSION: Probable mildly displaced proximal left femoral subcapital fracture. CT may be performed for further evaluation. Electronically Signed   By: Lynwood Landy Raddle M.D.   On: 06/02/2024 15:06   DG Chest 1 View Result Date: 06/02/2024 CLINICAL DATA:  Left hip pain after fall today. EXAM: CHEST  1 VIEW COMPARISON:  January 17, 2024. FINDINGS: Stable cardiomegaly. Both lungs are clear. The visualized skeletal structures are unremarkable. IMPRESSION: No active disease. Electronically Signed   By: Lynwood Landy Raddle M.D.   On: 06/02/2024 15:02   CT HEAD WO CONTRAST ( ) Result Date: 06/02/2024 CLINICAL DATA:  Head trauma, minor, normal mental status (Age 41-64y) EXAM: CT HEAD WITHOUT CONTRAST TECHNIQUE: Contiguous axial images were obtained from the base of the skull through the vertex without intravenous contrast. RADIATION DOSE REDUCTION: This exam was performed according to the departmental dose-optimization program which includes  automated exposure control, adjustment of the mA and/or kV according to patient size and/or use of iterative reconstruction technique. COMPARISON:  CT of the head dated December 26, 2018. FINDINGS: Brain: Chronic encephalomalacia changes in the left cerebellar hemisphere. Age-related cerebral volume loss and mild to moderate periventricular and deep cerebral white matter disease. No evidence of hemorrhage, mass, acute cortical infarct or hydrocephalus. Vascular: Mild calcific atheromatous disease within the carotid siphons and vertebral arteries. Skull: Intact. Sinuses/Orbits: Status post bilateral cataract surgery. Visualized paranasal sinuses and mastoid air cells are clear. Other: None. IMPRESSION: 1. Chronic encephalomalacia changes within the left cerebellar hemisphere and moderate cerebral white matter disease. No apparent acute process. Electronically Signed   By: Evalene Coho M.D.   On: 06/02/2024 15:00   CT Cervical Spine Wo Contrast Result Date: 06/02/2024 CLINICAL DATA:  Neck trauma. EXAM: CT CERVICAL SPINE WITHOUT CONTRAST TECHNIQUE: Multidetector CT imaging of the cervical spine was performed without intravenous contrast. Multiplanar CT image reconstructions were also generated. RADIATION DOSE REDUCTION: This exam was performed according to the departmental dose-optimization program which includes automated exposure control, adjustment of the mA and/or kV according to patient size and/or use of iterative reconstruction technique. COMPARISON:  Cervical spine radiographs 11/22/2006. FINDINGS: Alignment: Minimal stepwise anterolisthesis at C3-4 and C4-5. No focal angulation. Skull base and vertebrae: No evidence of acute cervical spine fracture or traumatic subluxation. Soft tissues and spinal canal: No prevertebral fluid or swelling. No visible canal hematoma. Disc levels: Multilevel spondylosis with disc space narrowing and uncinate spurring most advanced at C5-6 and C6-7. Multilevel  mild-to-moderate foraminal narrowing, greatest on the right at C5-6. Multilevel facet arthropathy. No large disc herniation or high-grade central stenosis identified. Upper chest: Clear lung apices.  Other: Mild carotid atherosclerosis. Mild thyroid  heterogeneity, similar to previous chest CTA 03/01/2023. Given the patient's age, no followup recommended unless clinically warranted.(Ref: J Am Coll Radiol. 2015 Feb;12(2): 143-50). IMPRESSION: 1. No evidence of acute cervical spine fracture, traumatic subluxation or static signs of instability. 2. Multilevel cervical spondylosis as described. Electronically Signed   By: Elsie Perone M.D.   On: 06/02/2024 14:50    Assessment & Plan:   Problem List Items Addressed This Visit     ABLA (acute blood loss anemia)   Clinically doing well... Check CBC today - ?relapsed C/o dark stools x 2 days. We are not sure if taking po iron  or not. On Pantaprazole. 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 We are not sure if taking po iron  or not. On Pantaprazole. Hem appt w/Dr Onesimo on Monday Take Pantaprazole To ER if wors      Relevant Orders   CBC with Differential/Platelet   Iron , TIBC and Ferritin Panel   Diarrhea   Daily (1-2/d) Loose dark stools x 2 days. We are not sure if taking po iron  or not. On Pantaprazole. Hem appt w/Dr Onesimo on Monday      DM2 (diabetes mellitus, type 2) (HCC)   Chronic On Metformin       Fatigue   No change      Full incontinence of feces   Occasional stool incontinence      Gait disorder   In w/c         No orders of the defined types were placed in this encounter.     Follow-up: Return in about 2 months (around 09/23/2024) for a follow-up visit.  Marolyn Noel, MD

## 2024-07-24 NOTE — Assessment & Plan Note (Addendum)
 C/o dark stools x 2 days. We are not sure if taking po iron  or not. On Pantaprazole. Hem appt w/Dr Onesimo on Monday Take Pantaprazole To ER if wors

## 2024-07-24 NOTE — Assessment & Plan Note (Signed)
Chronic On Metformin 

## 2024-07-24 NOTE — Assessment & Plan Note (Addendum)
 Clinically doing well... Check CBC today - ?relapsed C/o dark stools x 2 days. We are not sure if taking po iron  or not. On Pantaprazole. Hem appt w/Dr Onesimo on Monday To ER if worse

## 2024-07-25 LAB — IRON,TIBC AND FERRITIN PANEL
%SAT: 19 % (ref 16–45)
Ferritin: 222 ng/mL (ref 16–288)
Iron: 43 ug/dL — ABNORMAL LOW (ref 45–160)
TIBC: 226 ug/dL — ABNORMAL LOW (ref 250–450)

## 2024-07-26 ENCOUNTER — Ambulatory Visit: Payer: Self-pay | Admitting: Internal Medicine

## 2024-07-26 NOTE — Progress Notes (Signed)
 HEMATOLOGY/ONCOLOGY CONSULTATION NOTE  Date of Service: 07/27/2024  Patient Care Team: Garald Karlynn GAILS, MD as PCP - General (Internal Medicine) Chales Idol, MD as Consulting Physician (Urology) Burnetta Aures, MD as Consulting Physician (Orthopedic Surgery) Geneva Norleen NOVAK, MD as Referring Physician (Ophthalmology) Caresse Cough, MD as Referring Physician (Ophthalmology) Gaynel Delon CROME, DPM as Consulting Physician (Podiatry) Santo Stanly LABOR, MD as Consulting Physician (Cardiology) Clement Odor, LCSW as Social Worker (Licensed Clinical Social Worker)  CHIEF COMPLAINTS/PURPOSE OF CONSULTATION:  Iron  deficiency anemia  HISTORY OF PRESENTING ILLNESS:   Breanna [name] is a wonderful 89 y.o. female who has been referred back to us  by Plotnikov, Karlynn GAILS, MD for evaluation and management of iron  deficiency anemia.   She was previously seen by me in June 2019, for evaluation and management of neutropenia.   On review of labs from 07/24/2024, HGB 8.3, iron  saturation 19%, and ferritin 222.     MEDICAL HISTORY:  Past Medical History:  Diagnosis Date   Adrenal adenoma 12/24/2004   Boils 12/25/2007   Cholelithiasis    asympt. w/normal HIDA 03/2010   CVA (cerebral infarction) 12/24/2008   Cerebellar   Diverticulitis    Esophageal spasm 12/24/2009   GERD (gastroesophageal reflux disease)    Gout    History of colon polyps    HTN (hypertension)    Hydronephrosis    LEFT/ Surgical intervention   Hyperlipidemia    LBP (low back pain)    Osteoarthritis    Pulmonary HTN (HCC)    Stress    Type II or unspecified type diabetes mellitus without mention of complication, not stated as uncontrolled     SURGICAL HISTORY: Past Surgical History:  Procedure Laterality Date   ABDOMINAL HYSTERECTOMY     APPENDECTOMY     2020   BACK SURGERY     BREAST BIOPSY     RIGHT   CATARACT EXTRACTION, BILATERAL     COLECTOMY  2006   Sigmoid   COLONOSCOPY Left  01/20/2024   Procedure: COLONOSCOPY;  Surgeon: Abran Norleen SAILOR, MD;  Location: [facility];  Service: Gastroenterology;  Laterality: Left;   ESOPHAGOGASTRODUODENOSCOPY Left 01/19/2024   Procedure: ESOPHAGOGASTRODUODENOSCOPY (EGD);  Surgeon: San Sandor GAILS, DO;  Location: [facility];  Service: Gastroenterology;  Laterality: Left;   FOOT SURGERY     BILATERAL   HAMMER TOE SURGERY     HIP ARTHROPLASTY Left 06/03/2024   Procedure: HEMIARTHROPLASTY (BIPOLAR) HIP, POSTERIOR APPROACH FOR FRACTURE;  Surgeon: Edna Toribio LABOR, MD;  Location: [facility];  Service: Orthopedics;  Laterality: Left;  LEFT HIP HEMIARTHROPLASTY LATERAL POSITION WITH CEMENT   INTRAMEDULLARY (IM) NAIL INTERTROCHANTERIC Right 07/21/2020   Procedure: INTRAMEDULLARY (IM) NAIL INTERTROCHANTRIC;  Surgeon: Beverley Evalene BIRCH, MD;  Location: [facility];  Service: Orthopedics;  Laterality: Right;   ROTATOR CUFF REPAIR  2008   RIGHT   TOTAL KNEE ARTHROPLASTY  2003   LEFT   VARICOSE VEIN SURGERY     vein stripping/lower extremities    SOCIAL HISTORY: Social History   Socioeconomic History   Marital status: Widowed    Spouse name: Damonie Furney   Number of children: 1   Years of education: Not on file   Highest education level: 12th grade  Occupational History   Occupation: Retired    Associate Professor: RETIRED  Tobacco Use   Smoking status: Never   Smokeless tobacco: Never  Vaping Use   Vaping status: Never Used  Substance and Sexual Activity   Alcohol  use: No  Alcohol /week: 0.0 standard drinks of alcohol    Drug use: No   Sexual activity: Not Currently  Other Topics Concern   Not on file  Social History Narrative   Lives with son.   Social Drivers of Corporate investment banker Strain: Low Risk  (07/24/2024)   Overall Financial Resource Strain (CARDIA)    Difficulty of Paying Living Expenses: Not hard at all  Food Insecurity: No Food Insecurity (07/24/2024)   Hunger Vital Sign    Worried About Running Out of Food in the Last  Year: Never true    Ran Out of Food in the Last Year: Never true  Transportation Needs: No Transportation Needs (07/24/2024)   PRAPARE - Administrator, Civil Service (Medical): No    Lack of Transportation (Non-Medical): No  Physical Activity: Inactive (07/24/2024)   Exercise Vital Sign    Days of Exercise per Week: 0 days    Minutes of Exercise per Session: Not on file  Stress: No Stress Concern Present (07/24/2024)   [employer] of Occupational Health - Occupational Stress Questionnaire    Feeling of Stress: Only a little  Social Connections: Moderately Integrated (07/24/2024)   Social Connection and Isolation Panel    Frequency of Communication with Friends and Family: More than three times a week    Frequency of Social Gatherings with Friends and Family: Three times a week    Attends Religious Services: More than 4 times per year    Active Member of Clubs or Organizations: Yes    Attends Banker Meetings: More than 4 times per year    Marital Status: Widowed  Intimate Partner Violence: Patient Unable To Answer (06/03/2024)   Humiliation, Afraid, Rape, and Kick questionnaire    Fear of Current or Ex-Partner: Patient unable to answer    Emotionally Abused: Patient unable to answer    Physically Abused: Patient unable to answer    Sexually Abused: Patient unable to answer    FAMILY HISTORY: Family History  Problem Relation Age of Onset   Prostate cancer Father    Hypertension Father    Cancer Father        prostate   Heart disease Mother    Diabetes Mother    Diabetes Other        1st degree relative   Heart disease Other    Hypertension Other    Prostate cancer Maternal Uncle    Cancer Maternal Uncle        prostate   Breast cancer Daughter    Cancer Daughter        breast   Colon cancer Neg Hx     ALLERGIES:  is allergic to verapamil, aspirin , atenolol, codeine, codeine sulfate, hydrochlorothiazide, hydrocodone , ibuprofen , lisinopril, onion,  shellfish allergy, statins, valsartan, adhesive  [tape], and other.  MEDICATIONS:  Current Outpatient Medications  Medication Sig Dispense Refill   acetaminophen  (TYLENOL ) 325 MG tablet Take 2 tablets (650 mg total) by mouth every 4 (four) hours.     allopurinol  (ZYLOPRIM ) 100 MG tablet Take 0.5 tablets (50 mg total) by mouth every other day.     amLODipine  (NORVASC ) 10 MG tablet Take 1 tablet (10 mg total) by mouth daily. 90 tablet 3   artificial tears ophthalmic solution Place 1 drop into both eyes as needed for dry eyes.     atropine  1 % ophthalmic solution Place 1 drop into both eyes daily.     calcium  carbonate (OSCAL) 1500 (600 Ca) MG TABS  tablet Take 1,500 mg by mouth daily.     carvedilol  (COREG ) 12.5 MG tablet Take 1 tablet (12.5 mg total) by mouth 2 (two) times daily with a meal. 60 tablet 11   cholecalciferol  (CHOLECALCIFEROL ) 25 MCG tablet Take 1 tablet (1,000 Units total) by mouth daily.     [Paused] [employer] VITAMIN B12 1000 MCG tablet TAKE 1 TABLET (1,000 MCG TOTAL) BY MOUTH EVERY DAY 90 tablet 3   docusate sodium  (COLACE) 100 MG capsule TAKE 1 CAPSULE BY MOUTH TWICE A DAY 180 capsule 3   dorzolamide -timolol  (COSOPT ) 22.3-6.8 MG/ML ophthalmic solution Place 1 drop into both eyes 2 (two) times daily.      feeding supplement (ENSURE PLUS HIGH PROTEIN) LIQD Take 237 mLs by mouth 2 (two) times daily between meals.     ferrous sulfate  325 (65 FE) MG tablet Take 1 tablet (325 mg total) by mouth daily with breakfast.     gabapentin  (NEURONTIN ) 100 MG capsule Take 1 capsule (100 mg total) by mouth at bedtime.     glucose blood (GNP TRUE METRIX GLUCOSE STRIPS) test strip Use to check blood sugars twice a day 200 each 2   latanoprost  (XALATAN ) 0.005 % ophthalmic solution Place 1 drop into both eyes at bedtime.      [Paused] metFORMIN  (GLUCOPHAGE ) 500 MG tablet Take 500 mg by mouth daily with breakfast.     ondansetron  (ZOFRAN -ODT) 4 MG disintegrating tablet Take 1 tablet (4 mg total) by mouth  every 8 (eight) hours as needed for nausea or vomiting.     pantoprazole  (PROTONIX ) 40 MG tablet TAKE 1 TABLET BY MOUTH EVERY DAY 90 tablet 3   polyethylene glycol (MIRALAX  / GLYCOLAX ) 17 g packet Take 17 g by mouth daily as needed for mild constipation.     rosuvastatin  (CRESTOR ) 5 MG tablet Take 5 mg by mouth daily.     senna (SENOKOT) 8.6 MG TABS tablet Take 1 tablet (8.6 mg total) by mouth at bedtime. While taking opioids     sertraline  (ZOLOFT ) 100 MG tablet Take 1 tablet (100 mg total) by mouth at bedtime. 90 tablet 1   spironolactone  (ALDACTONE ) 25 MG tablet Take 25 mg by mouth 3 (three) times a week.     torsemide  (DEMADEX ) 20 MG tablet Take 1 tablet (20 mg total) by mouth daily as needed. Take if needed once per day for lower extremity swelling. If you are having to take more than twice per week, please call and let us  know 30 tablet 2   No current facility-administered medications for this visit.    REVIEW OF SYSTEMS:    10 Point review of Systems was done is negative except as noted above.  PHYSICAL EXAMINATION: ECOG PERFORMANCE STATUS: 2 - Symptomatic, <50% confined to bed  . Vitals:   07/27/24 1129  BP: 135/64  Pulse: (!) 43  Resp: 18  Temp: 97.7 F (36.5 C)  SpO2: 98%   Filed Weights   07/27/24 1129  Weight: 108 lb 12.8 oz (49.4 kg)   .Body mass index is 19.9 kg/m.  GENERAL:alert, in no acute distress and comfortable SKIN: no acute rashes, no significant lesions EYES: conjunctiva are pink and non-injected, sclera anicteric OROPHARYNX: MMM, no exudates, no oropharyngeal erythema or ulceration NECK: supple, no JVD LYMPH:  no palpable lymphadenopathy in the cervical, axillary or inguinal regions LUNGS: clear to auscultation b/l with normal respiratory effort HEART: regular rate & rhythm ABDOMEN:  normoactive bowel sounds , non tender, not distended. Extremity: no pedal edema  PSYCH: alert & oriented x 3 with fluent speech NEURO: no focal motor/sensory  deficits  LABORATORY DATA:  I have reviewed the data as listed  .    Latest Ref Rng & Units 07/24/2024    2:18 PM 06/24/2024   12:26 PM 06/06/2024    6:10 AM  CBC  WBC 4.0 - 10.5 K/uL 4.5  4.5  7.8   Hemoglobin 12.0 - 15.0 g/dL 8.3  9.4  7.6   Hematocrit 36.0 - 46.0 % 25.7  29.1  23.2   Platelets 150.0 - 400.0 K/uL 149.0  195.0  103    .CBC    Component Value Date/Time   WBC 4.5 07/24/2024 1418   RBC 3.06 (L) 07/27/2024 1241   RBC 2.94 (L) 07/24/2024 1418   HGB 8.3 (L) 07/24/2024 1418   HCT 25.7 (L) 07/24/2024 1418   PLT 149.0 (L) 07/24/2024 1418   MCV 87.6 07/24/2024 1418   MCH 27.0 06/06/2024 0610   MCHC 32.1 07/24/2024 1418   RDW 17.8 (H) 07/24/2024 1418   LYMPHSABS 0.8 07/24/2024 1418   MONOABS 0.4 07/24/2024 1418   EOSABS 0.1 07/24/2024 1418   BASOSABS 0.0 07/24/2024 1418    .    Latest Ref Rng & Units 06/24/2024   12:26 PM 06/06/2024    6:10 AM 06/05/2024    6:46 AM  CMP  Glucose 70 - 99 mg/dL 872  882  92   BUN 6 - 23 mg/dL 22  24  23    Creatinine 0.40 - 1.20 mg/dL 8.75  8.56  8.42   Sodium 135 - 145 mEq/L 141  138  138   Potassium 3.5 - 5.1 mEq/L 4.6  3.6  3.8   Chloride 96 - 112 mEq/L 106  108  108   CO2 19 - 32 mEq/L 29  23  23    Calcium  8.4 - 10.5 mg/dL 9.3  8.4  8.0   Total Protein 6.0 - 8.3 g/dL 7.1     Total Bilirubin 0.2 - 1.2 mg/dL 0.4     Alkaline Phos 39 - 117 U/L 124     AST 0 - 37 U/L 18     ALT 0 - 35 U/L 8        RADIOGRAPHIC STUDIES: I have personally reviewed the radiological images as listed and agreed with the findings in the report. No results found.  ASSESSMENT & PLAN:   89 y.o. female with multiple medical co-morbids with progressive normocytic Anemia . Patient Active Problem List   Diagnosis Date Noted   IDA (iron  deficiency anemia) 08/03/2024   Heel pain, bilateral 06/24/2024   Stage 3a chronic kidney disease (CKD) (HCC) 06/06/2024   Closed subcapital fracture of neck of femur, left, initial encounter (HCC) 06/02/2024    Insomnia 02/26/2024   Pressure sore 02/14/2024   Lower gastrointestinal bleeding 01/23/2024   Acute blood loss anemia 01/22/2024   Diverticulosis of colon with hemorrhage 01/20/2024   ABLA (acute blood loss anemia) 01/18/2024   Heme positive stool 01/18/2024   Anemia 01/18/2024   Thrombocytopenia (HCC) 01/17/2024   Blindness of both eyes 10/17/2023   Stress    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   Headache 10/16/2023   Ankle pain, left 06/14/2023   Leg abrasion 06/14/2023   Elevated troponin 03/01/2023   Depression 03/01/2023   Left shoulder pain 02/28/2023   Pes anserinus bursitis of both knees 05/23/2022   Gait disorder 03/07/2022   Falls frequently 11/29/2021   Vision loss 11/29/2021   Parkinsonian  features 05/23/2021   Atherosclerosis of aorta (HCC) 05/23/2021   Normocytic anemia 08/18/2020   Constipation 08/18/2020   Sinus bradycardia 07/21/2020   Hip pain 07/06/2020   Acute pain of right knee 09/28/2018   Well adult exam 05/13/2018   Night sweats 04/04/2018   Breast symptom 10/30/2017   Diarrhea 08/21/2017   Lung nodule 08/21/2017   FTT (failure to thrive) in adult 08/21/2017   Shortness of breath 08/13/2017   Cough 08/13/2017   Fatigue 08/13/2017   Blurry vision 08/13/2017   Shoulder pain, right 07/25/2017   Callus of foot 06/07/2017   Need for prophylactic vaccination and inoculation against influenza 11/21/2016   Trigger finger, acquired 11/12/2016   Meteorism 07/11/2016   Dark stools 01/10/2016   Osteoporosis 07/13/2015   Cornea disorder 11/22/2014   Primary open angle glaucoma of both eyes, indeterminate stage 11/22/2014   Secondary corneal edema 10/14/2014   Swelling of left knee joint 04/07/2014   Grief 01/04/2014   Syncope 01/04/2014   Thoracic spine pain 10/22/2013   Hypernatremia 07/07/2013   Diabetic neuropathy (HCC) 07/30/2012   Neck pain on right side 07/30/2012   Cystoid macular edema 06/19/2012   History of corneal transplant 06/19/2012    Weight loss 04/10/2012   PARESTHESIA 01/03/2011   Full incontinence of feces 01/01/2011   LEG PAIN, LEFT 10/02/2010   History of cardioembolic cerebrovascular accident (CVA) 03/27/2010   GERD (gastroesophageal reflux disease) 03/27/2010   CHOLELITHIASIS 03/27/2010   Cerebral artery occlusion with cerebral infarction (HCC) 03/27/2010   LOW BACK PAIN 09/07/2009   History of anemia due to chronic kidney disease 06/14/2009   Dysphagia 05/16/2009   Dyslipidemia 11/06/2007   Hyperlipidemia 11/06/2007   DM2 (diabetes mellitus, type 2) (HCC) 07/29/2007   Gout 07/29/2007   HTN (hypertension) 07/29/2007   Hydronephrosis 07/29/2007   Osteoarthritis 07/29/2007     Progressive Normocytic Anemia Patient had closed # of left hip s/ p hemiarthroplasty on 6/11 with acute blood loss anemia Intermittent rectal bleeding.  EGD abd colonoscopy in 12/2023 -- thought to be diverticular. PLAN:  -reviewed her previous labs with patient in detail -CBC from 07/24/2024 shows HGB 8.3, WBC 4.5, and PLT 149K -iron  labs 07/24/2024 show iron  saturation 19% and ferritin 222 -iron  labs 06/24/2024 show iron  saturation 17% and ferritin 483 - patient continues to have intermittent GI loss with dark stools. -with CKD -- Ferritin goal of >=500 with Iron  sat of >=30%. -based on response and labs results might need to consider ESA's for CKD and for possible low risk MDS. -patient prefers to be conservative with the workup and does not want to have BMBx unless absolutely necessary -will setup for Monoferric 1000mg  at [address] infusion. -f/u with PCP and GI if ongoing overt bleeding -lab workup ordered as noted below. -can develop iron  and B vit deficiency from chronic PPI use -can hold po iron .   . Orders Placed This Encounter  Procedures   Ferritin    Standing Status:   Future    Number of Occurrences:   1    Expected Date:   07/27/2024    Expiration Date:   07/27/2025   Iron  and Iron  Binding Capacity ([facility] only)     Standing Status:   Future    Number of Occurrences:   1    Expected Date:   07/27/2024    Expiration Date:   07/27/2025   Lactate dehydrogenase    Standing Status:   Future    Number of Occurrences:  1    Expected Date:   07/27/2024    Expiration Date:   07/27/2025   Haptoglobin    Standing Status:   Future    Number of Occurrences:   1    Expected Date:   07/27/2024    Expiration Date:   07/27/2025   Retic Panel    Standing Status:   Future    Number of Occurrences:   1    Expected Date:   07/27/2024    Expiration Date:   07/27/2025   Multiple Myeloma Panel (SPEP&IFE w/QIG)    Standing Status:   Future    Number of Occurrences:   1    Expected Date:   07/27/2024    Expiration Date:   07/27/2025   Kappa/lambda light chains    Standing Status:   Future    Number of Occurrences:   1    Expected Date:   07/27/2024    Expiration Date:   07/27/2025   TSH    Standing Status:   Future    Number of Occurrences:   1    Expected Date:   07/27/2024    Expiration Date:   07/27/2025   Sedimentation rate    Standing Status:   Future    Number of Occurrences:   1    Expected Date:   07/27/2024    Expiration Date:   07/27/2025    FOLLOW-UP:  Labs today Phone visit with Dr Onesimo in 1-2 weeks IV Monoferric 1 dose at [employer] ASAP   The total time spent in the appointment was 45 minutes* .  All of the patient's questions were answered with apparent satisfaction. The patient knows to call the clinic with any problems, questions or concerns.   Emaline Onesimo MD MS AAHIVMS [facility] Hematology/Oncology Physician [facility]  .*Total Encounter Time as defined by the Centers for Medicare and Medicaid Services includes, in addition to the face-to-face time of a patient visit (documented in the note above) non-face-to-face time: obtaining and reviewing outside history, ordering and reviewing medications, tests or procedures, care coordination (communications with other health care professionals or  caregivers) and documentation in the medical record.    I,Mitra Faeizi,acting as a Neurosurgeon for Emaline Onesimo, MD.,have documented all relevant documentation on the behalf of Emaline Onesimo, MD,as directed by  Emaline Onesimo, MD while in the presence of Emaline Onesimo, MD.  .I have reviewed the above documentation for accuracy and completeness, and I agree with the above. .Gautam Kishore Kale MD

## 2024-07-27 ENCOUNTER — Encounter: Payer: Self-pay | Admitting: Internal Medicine

## 2024-07-27 ENCOUNTER — Inpatient Hospital Stay

## 2024-07-27 ENCOUNTER — Inpatient Hospital Stay: Attending: Hematology | Admitting: Hematology

## 2024-07-27 VITALS — BP 135/64 | HR 43 | Temp 97.7°F | Resp 18 | Wt 108.8 lb

## 2024-07-27 DIAGNOSIS — Z803 Family history of malignant neoplasm of breast: Secondary | ICD-10-CM | POA: Diagnosis not present

## 2024-07-27 DIAGNOSIS — K625 Hemorrhage of anus and rectum: Secondary | ICD-10-CM | POA: Insufficient documentation

## 2024-07-27 DIAGNOSIS — D500 Iron deficiency anemia secondary to blood loss (chronic): Secondary | ICD-10-CM | POA: Diagnosis not present

## 2024-07-27 DIAGNOSIS — D649 Anemia, unspecified: Secondary | ICD-10-CM

## 2024-07-27 DIAGNOSIS — Z8042 Family history of malignant neoplasm of prostate: Secondary | ICD-10-CM | POA: Insufficient documentation

## 2024-07-27 LAB — IRON AND IRON BINDING CAPACITY (CC-WL,HP ONLY)
Iron: 48 ug/dL (ref 28–170)
Saturation Ratios: 19 % (ref 10.4–31.8)
TIBC: 248 ug/dL — ABNORMAL LOW (ref 250–450)
UIBC: 200 ug/dL (ref 148–442)

## 2024-07-27 LAB — SEDIMENTATION RATE: Sed Rate: 55 mm/h — ABNORMAL HIGH (ref 0–22)

## 2024-07-27 LAB — RETIC PANEL
Immature Retic Fract: 16.8 % — ABNORMAL HIGH (ref 2.3–15.9)
RBC.: 3.06 MIL/uL — ABNORMAL LOW (ref 3.87–5.11)
Retic Count, Absolute: 47.4 K/uL (ref 19.0–186.0)
Retic Ct Pct: 1.6 % (ref 0.4–3.1)
Reticulocyte Hemoglobin: 28.3 pg (ref >27.9)

## 2024-07-27 LAB — FERRITIN: Ferritin: 366 ng/mL — ABNORMAL HIGH (ref 11–307)

## 2024-07-27 LAB — TSH: TSH: 0.702 u[IU]/mL (ref 0.350–4.500)

## 2024-07-27 LAB — LACTATE DEHYDROGENASE: LDH: 160 U/L (ref 98–192)

## 2024-07-28 DIAGNOSIS — N1832 Chronic kidney disease, stage 3b: Secondary | ICD-10-CM | POA: Diagnosis not present

## 2024-07-28 DIAGNOSIS — D631 Anemia in chronic kidney disease: Secondary | ICD-10-CM | POA: Diagnosis not present

## 2024-07-28 DIAGNOSIS — N179 Acute kidney failure, unspecified: Secondary | ICD-10-CM | POA: Diagnosis not present

## 2024-07-28 DIAGNOSIS — S32592D Other specified fracture of left pubis, subsequent encounter for fracture with routine healing: Secondary | ICD-10-CM | POA: Diagnosis not present

## 2024-07-28 DIAGNOSIS — I10 Essential (primary) hypertension: Secondary | ICD-10-CM | POA: Diagnosis not present

## 2024-07-28 DIAGNOSIS — S72012D Unspecified intracapsular fracture of left femur, subsequent encounter for closed fracture with routine healing: Secondary | ICD-10-CM | POA: Diagnosis not present

## 2024-07-28 DIAGNOSIS — E1122 Type 2 diabetes mellitus with diabetic chronic kidney disease: Secondary | ICD-10-CM | POA: Diagnosis not present

## 2024-07-28 DIAGNOSIS — Z96642 Presence of left artificial hip joint: Secondary | ICD-10-CM | POA: Diagnosis not present

## 2024-07-28 DIAGNOSIS — M47812 Spondylosis without myelopathy or radiculopathy, cervical region: Secondary | ICD-10-CM | POA: Diagnosis not present

## 2024-07-28 LAB — KAPPA/LAMBDA LIGHT CHAINS
Kappa free light chain: 108 mg/L — ABNORMAL HIGH (ref 3.3–19.4)
Kappa, lambda light chain ratio: 1.03 (ref 0.26–1.65)
Lambda free light chains: 104.8 mg/L — ABNORMAL HIGH (ref 5.7–26.3)

## 2024-07-28 LAB — HAPTOGLOBIN: Haptoglobin: 242 mg/dL (ref 41–333)

## 2024-07-30 LAB — MULTIPLE MYELOMA PANEL, SERUM
Albumin SerPl Elph-Mcnc: 3.5 g/dL (ref 2.9–4.4)
Albumin/Glob SerPl: 1.2 (ref 0.7–1.7)
Alpha 1: 0.2 g/dL (ref 0.0–0.4)
Alpha2 Glob SerPl Elph-Mcnc: 0.7 g/dL (ref 0.4–1.0)
B-Globulin SerPl Elph-Mcnc: 0.9 g/dL (ref 0.7–1.3)
Gamma Glob SerPl Elph-Mcnc: 1.2 g/dL (ref 0.4–1.8)
Globulin, Total: 3.1 g/dL (ref 2.2–3.9)
IgA: 570 mg/dL — ABNORMAL HIGH (ref 64–422)
IgG (Immunoglobin G), Serum: 1451 mg/dL (ref 586–1602)
IgM (Immunoglobulin M), Srm: 53 mg/dL (ref 26–217)
Total Protein ELP: 6.6 g/dL (ref 6.0–8.5)

## 2024-08-03 ENCOUNTER — Telehealth: Payer: Self-pay

## 2024-08-03 ENCOUNTER — Encounter: Payer: Self-pay | Admitting: Hematology

## 2024-08-03 DIAGNOSIS — R32 Unspecified urinary incontinence: Secondary | ICD-10-CM

## 2024-08-03 DIAGNOSIS — I10 Essential (primary) hypertension: Secondary | ICD-10-CM | POA: Diagnosis not present

## 2024-08-03 DIAGNOSIS — N179 Acute kidney failure, unspecified: Secondary | ICD-10-CM | POA: Diagnosis not present

## 2024-08-03 DIAGNOSIS — M47812 Spondylosis without myelopathy or radiculopathy, cervical region: Secondary | ICD-10-CM | POA: Diagnosis not present

## 2024-08-03 DIAGNOSIS — E1122 Type 2 diabetes mellitus with diabetic chronic kidney disease: Secondary | ICD-10-CM | POA: Diagnosis not present

## 2024-08-03 DIAGNOSIS — S72012D Unspecified intracapsular fracture of left femur, subsequent encounter for closed fracture with routine healing: Secondary | ICD-10-CM | POA: Diagnosis not present

## 2024-08-03 DIAGNOSIS — D509 Iron deficiency anemia, unspecified: Secondary | ICD-10-CM | POA: Insufficient documentation

## 2024-08-03 DIAGNOSIS — Z96642 Presence of left artificial hip joint: Secondary | ICD-10-CM | POA: Diagnosis not present

## 2024-08-03 DIAGNOSIS — N1832 Chronic kidney disease, stage 3b: Secondary | ICD-10-CM | POA: Diagnosis not present

## 2024-08-03 DIAGNOSIS — D631 Anemia in chronic kidney disease: Secondary | ICD-10-CM | POA: Diagnosis not present

## 2024-08-03 DIAGNOSIS — S32592D Other specified fracture of left pubis, subsequent encounter for fracture with routine healing: Secondary | ICD-10-CM | POA: Diagnosis not present

## 2024-08-03 NOTE — Telephone Encounter (Signed)
 Copied from CRM #[id number]. Topic: General - Other >> Aug 03, 2024 12:53 PM [place] J wrote: Reason for CRM: [name], a physical therapist with [facility], wanted to let Dr. Garald know that last week there has been a change in the patient's urination. She has had a couple episodes of urinary incontinence, along with burning on the skin that would last until she was able to lean herself. Concerns have limited confidence of leaving home.   Callback: [phone]

## 2024-08-04 ENCOUNTER — Telehealth (HOSPITAL_COMMUNITY): Payer: Self-pay | Admitting: Pharmacy Technician

## 2024-08-04 ENCOUNTER — Ambulatory Visit: Admitting: Licensed Clinical Social Worker

## 2024-08-04 NOTE — Telephone Encounter (Signed)
 Use Aquaphore for barrier.  Can we collect a urine specimen?  I will order UA/culture.  Thank you

## 2024-08-04 NOTE — Telephone Encounter (Signed)
 Auth Submission: PENDING Site of care: [facility] Payer: [employer] Medicare Medication & CPT/J Code(s) submitted: Monoferric (Ferrci derisomaltose) [phone] Diagnosis Code: D50.0 Route of submission (phone, fax, portal): portal Phone # Fax # Auth type: Buy/Bill PB Units/visits requested: 1000mg  x 1 dose Reference number: [id number]     Dagoberto Armour, CPhT [facility] Phone: [phone] 08/04/2024

## 2024-08-05 ENCOUNTER — Telehealth: Payer: Self-pay

## 2024-08-05 ENCOUNTER — Ambulatory Visit: Admitting: Internal Medicine

## 2024-08-05 NOTE — Telephone Encounter (Signed)
 Copied from CRM [id number]. Topic: Clinical - Lab/Test Results >> Aug 05, 2024 10:22 AM Deleta RAMAN wrote: Reason for CRM: patient is calling for results of her labs. Please contact her son alan [phone].

## 2024-08-05 NOTE — Telephone Encounter (Signed)
 [employer] faxed back additional questions to be answered for PA.   **Has the patient had prior therapy, contraindication, or intolerance to Infed AND Venofer ?  Form scanned into media tab.  Faxed back to [phone].

## 2024-08-06 ENCOUNTER — Other Ambulatory Visit: Payer: Self-pay | Admitting: Hematology

## 2024-08-06 ENCOUNTER — Telehealth: Payer: Self-pay

## 2024-08-06 NOTE — Telephone Encounter (Signed)
 Dr. Onesimo, patient will be scheduled as soon as possible.  Auth Submission: NO AUTH NEEDED Site of care: Site of care: [facility] Payer: [employer] medicare Medication & CPT/J Code(s) submitted: Venofer  (Iron  Sucrose) J1756 Diagnosis Code:  Route of submission (phone, fax, portal):  Phone # Fax # Auth type: Buy/Bill PB Units/visits requested: 300mg  x 3 doses Reference number:  Approval from: 08/06/24 to 12/06/24

## 2024-08-06 NOTE — Telephone Encounter (Signed)
 Auth Submission: DENIED Site of care: [facility] Payer: [employer] Medicare Medication & CPT/J Code(s) submitted: Monoferric (Ferrci derisomaltose) [phone] Diagnosis Code:  Route of submission (phone, fax, portal):  Phone # Fax #  Denied by insurance. Patient must have tried Infed and Venofer  before Monoferric will be approved.  Bruno sent message to provider to change to preferred med Venofer .    Dagoberto Armour, CPhT [facility] Phone: [phone] 08/06/2024

## 2024-08-07 NOTE — Telephone Encounter (Signed)
 Tried to reach pt in reagrds to concerns in this message... Pts provider has stated Use Aquaphore for barrier.  Can we collect a urine specimen?  I will order UA/culture.  Thank you  Please ask the above upon return call to the clinic. If pt is able to give urine specimen please have pt go to the lab as labs have been ordered.

## 2024-08-11 NOTE — Telephone Encounter (Signed)
 Provider has stated the following in regards to labs... Please inform him of this information upon return call to clinic  He is probably inquiring about her recent labs done at the Hematology oncology office.  He should check with Dr. Maryla office.  Thanks

## 2024-08-11 NOTE — Telephone Encounter (Signed)
 He is probably inquiring about her recent labs done at the Hematology oncology office.  He should check with Dr. Maryla office.  Thanks

## 2024-08-12 ENCOUNTER — Telehealth: Payer: Self-pay | Admitting: Internal Medicine

## 2024-08-12 ENCOUNTER — Ambulatory Visit: Admitting: Internal Medicine

## 2024-08-12 ENCOUNTER — Ambulatory Visit: Payer: Self-pay

## 2024-08-12 ENCOUNTER — Encounter: Payer: Self-pay | Admitting: Internal Medicine

## 2024-08-12 VITALS — BP 136/73 | HR 51 | Temp 98.0°F | Ht 62.0 in | Wt 108.0 lb

## 2024-08-12 DIAGNOSIS — R051 Acute cough: Secondary | ICD-10-CM | POA: Diagnosis not present

## 2024-08-12 DIAGNOSIS — M25512 Pain in left shoulder: Secondary | ICD-10-CM | POA: Diagnosis not present

## 2024-08-12 DIAGNOSIS — D62 Acute posthemorrhagic anemia: Secondary | ICD-10-CM | POA: Diagnosis not present

## 2024-08-12 DIAGNOSIS — D509 Iron deficiency anemia, unspecified: Secondary | ICD-10-CM

## 2024-08-12 MED ORDER — AZITHROMYCIN 250 MG PO TABS
ORAL_TABLET | ORAL | 0 refills | Status: AC
Start: 2024-08-12 — End: ?

## 2024-08-12 NOTE — Telephone Encounter (Unsigned)
 Copied from CRM #[id number]. Topic: Clinical - Pink Word Triage >> Aug 12, 2024  8:09 AM [name]: Reason for Triage: *** >> Aug 12, 2024  8:10 AM [name]: Patient's son called in because the sitter said that yesterday patient kept her eyes closed all day and seems week. 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

## 2024-08-12 NOTE — Assessment & Plan Note (Addendum)
 Iron  infusion is pending soon

## 2024-08-12 NOTE — Assessment & Plan Note (Signed)
 URI Z pack given Use Delsym

## 2024-08-12 NOTE — Telephone Encounter (Signed)
 Duplicate encounter. Added to error log. Patient previously triaged.

## 2024-08-12 NOTE — Patient Instructions (Signed)
 USEFUL THINGS FOR ARTHRITIS and musculoskeletal pains:    A rice sock heating pad refers to a homemade heating pad created by filling a sock with uncooked rice, which can be heated in a microwave to provide a warm compress for sore muscles, pain relief, or other applications; essentially, it's a simple way to generate heat using readily available materials.  Key points about rice sock heat: How to make it: Fill a clean sock (preferably a tube sock) about 2/3 full with uncooked rice, tie a knot at the top to secure the rice inside.  Heating it up: Place the rice sock in the microwave and heat in short intervals (usually around 30 seconds at a time) until it reaches the desired warmth.  Important considerations: Check temperature before applying: Always test the temperature of the rice sock before applying it to your skin to avoid burns.  Use a towel to protect skin: Wrap the rice sock in a thin towel to distribute the heat evenly and protect your skin.  Uses: Muscle aches and pains  Menstrual cramps  Neck pain  Arthritis discomfort   BLUE EMU CREAM: Use it 2-3 times a day on painful areas

## 2024-08-12 NOTE — Assessment & Plan Note (Signed)
Blue-Emu cream was recommended to use 2-3 times a day ? ?

## 2024-08-12 NOTE — Progress Notes (Signed)
 Subjective:  Patient ID: Breanna [name], female    DOB: April 03, 1931  Age: 89 y.o. MRN: [id number]  CC: Nasal Congestion (Pt states she has been having congestion, cough, post nasal drip, sore throat, loss of voice x3 days and pt is having increase in her weakness... Pt states in her left arm when she had her blood drawn she is still having pain going into her elbow)   HPI [facility] presents for Nasal Congestion. Pt states she has been having congestion, cough, post nasal drip, sore throat, loss of voice x3 days and pt is having increase in her weakness... Pt states in her left arm when she had her blood drawn she is still having pain going into her elbow  Outpatient Medications Prior to Visit  Medication Sig Dispense Refill   acetaminophen  (TYLENOL ) 325 MG tablet Take 2 tablets (650 mg total) by mouth every 4 (four) hours.     allopurinol  (ZYLOPRIM ) 100 MG tablet Take 0.5 tablets (50 mg total) by mouth every other day.     amLODipine  (NORVASC ) 10 MG tablet Take 1 tablet (10 mg total) by mouth daily. 90 tablet 3   artificial tears ophthalmic solution Place 1 drop into both eyes as needed for dry eyes.     atropine  1 % ophthalmic solution Place 1 drop into both eyes daily.     calcium  carbonate (OSCAL) 1500 (600 Ca) MG TABS tablet Take 1,500 mg by mouth daily.     carvedilol  (COREG ) 12.5 MG tablet Take 1 tablet (12.5 mg total) by mouth 2 (two) times daily with a meal. 60 tablet 11   cholecalciferol  (CHOLECALCIFEROL ) 25 MCG tablet Take 1 tablet (1,000 Units total) by mouth daily.     [employer] VITAMIN B12 1000 MCG tablet TAKE 1 TABLET (1,000 MCG TOTAL) BY MOUTH EVERY DAY 90 tablet 3   docusate sodium  (COLACE) 100 MG capsule TAKE 1 CAPSULE BY MOUTH TWICE A DAY 180 capsule 3   dorzolamide -timolol  (COSOPT ) 22.3-6.8 MG/ML ophthalmic solution Place 1 drop into both eyes 2 (two) times daily.      feeding supplement (ENSURE PLUS HIGH PROTEIN) LIQD Take 237 mLs by mouth 2 (two) times daily between meals.      ferrous sulfate  325 (65 FE) MG tablet Take 1 tablet (325 mg total) by mouth daily with breakfast.     gabapentin  (NEURONTIN ) 100 MG capsule Take 1 capsule (100 mg total) by mouth at bedtime.     glucose blood (GNP TRUE METRIX GLUCOSE STRIPS) test strip Use to check blood sugars twice a day 200 each 2   latanoprost  (XALATAN ) 0.005 % ophthalmic solution Place 1 drop into both eyes at bedtime.      metFORMIN  (GLUCOPHAGE ) 500 MG tablet Take 500 mg by mouth daily with breakfast.     ondansetron  (ZOFRAN -ODT) 4 MG disintegrating tablet Take 1 tablet (4 mg total) by mouth every 8 (eight) hours as needed for nausea or vomiting.     pantoprazole  (PROTONIX ) 40 MG tablet TAKE 1 TABLET BY MOUTH EVERY DAY 90 tablet 3   polyethylene glycol (MIRALAX  / GLYCOLAX ) 17 g packet Take 17 g by mouth daily as needed for mild constipation.     rosuvastatin  (CRESTOR ) 5 MG tablet Take 5 mg by mouth daily.     senna (SENOKOT) 8.6 MG TABS tablet Take 1 tablet (8.6 mg total) by mouth at bedtime. While taking opioids     sertraline  (ZOLOFT ) 100 MG tablet Take 1 tablet (100 mg total) by mouth  at bedtime. 90 tablet 1   spironolactone  (ALDACTONE ) 25 MG tablet Take 25 mg by mouth 3 (three) times a week.     torsemide  (DEMADEX ) 20 MG tablet Take 1 tablet (20 mg total) by mouth daily as needed. Take if needed once per day for lower extremity swelling. If you are having to take more than twice per week, please call and let us  know 30 tablet 2   No facility-administered medications prior to visit.    ROS: Review of Systems  Constitutional:  Positive for fatigue. Negative for activity change, appetite change, chills and unexpected weight change.  HENT:  Positive for voice change. Negative for congestion, mouth sores, sinus pressure and sore throat.   Eyes:  Positive for visual disturbance.  Respiratory:  Positive for cough. Negative for chest tightness, shortness of breath and wheezing.   Cardiovascular:  Negative for chest  pain.  Gastrointestinal:  Negative for abdominal pain and nausea.  Genitourinary:  Negative for difficulty urinating, frequency and vaginal pain.  Musculoskeletal:  Negative for back pain and gait problem.  Skin:  Negative for pallor and rash.  Neurological:  Negative for dizziness, tremors, weakness, numbness and headaches.  Psychiatric/Behavioral:  Negative for confusion, sleep disturbance and suicidal ideas. The patient is not nervous/anxious.     Objective:  BP 136/73   Pulse (!) 51   Temp 98 F (36.7 C) (Oral)   Ht 5' 2 (1.575 m)   Wt 108 lb (49 kg)   SpO2 100%   BMI 19.75 kg/m   BP Readings from Last 3 Encounters:  08/12/24 136/73  07/27/24 135/64  07/24/24 (!) 148/80    Wt Readings from Last 3 Encounters:  08/12/24 108 lb (49 kg)  07/27/24 108 lb 12.8 oz (49.4 kg)  07/24/24 107 lb (48.5 kg)    Physical Exam Constitutional:      General: She is not in acute distress.    Appearance: Normal appearance. She is well-developed.  HENT:     Head: Normocephalic.     Right Ear: External ear normal.     Left Ear: External ear normal.     Nose: Nose normal.  Eyes:     General:        Right eye: No discharge.        Left eye: No discharge.     Conjunctiva/sclera: Conjunctivae normal.     Pupils: Pupils are equal, round, and reactive to light.  Neck:     Thyroid : No thyromegaly.     Vascular: No JVD.     Trachea: No tracheal deviation.  Cardiovascular:     Rate and Rhythm: Normal rate and regular rhythm.     Heart sounds: Normal heart sounds.  Pulmonary:     Effort: No respiratory distress.     Breath sounds: No stridor. No wheezing.  Abdominal:     General: Bowel sounds are normal. There is no distension.     Palpations: Abdomen is soft. There is no mass.     Tenderness: There is no abdominal tenderness. There is no guarding or rebound.  Musculoskeletal:        General: No tenderness.     Cervical back: Normal range of motion and neck supple. No rigidity.   Lymphadenopathy:     Cervical: No cervical adenopathy.  Skin:    Findings: No erythema or rash.  Neurological:     Cranial Nerves: No cranial nerve deficit.     Motor: No abnormal muscle tone.  Coordination: Coordination normal.     Deep Tendon Reflexes: Reflexes normal.  Psychiatric:        Behavior: Behavior normal.        Thought Content: Thought content normal.        Judgment: Judgment normal.    Wax in the R ear Hoarse Eryth throat  Lab Results  Component Value Date   WBC 4.5 07/24/2024   HGB 8.3 (L) 07/24/2024   HCT 25.7 (L) 07/24/2024   PLT 149.0 (L) 07/24/2024   GLUCOSE 127 (H) 06/24/2024   CHOL 127 03/01/2023   TRIG 42 03/01/2023   HDL 53 03/01/2023   LDLCALC 66 03/01/2023   ALT 8 06/24/2024   AST 18 06/24/2024   NA 141 06/24/2024   K 4.6 06/24/2024   CL 106 06/24/2024   CREATININE 1.24 (H) 06/24/2024   BUN 22 06/24/2024   CO2 29 06/24/2024   TSH 0.702 07/27/2024   HGBA1C 6.3 06/24/2024    CT HIP LEFT WO CONTRAST Result Date: 06/02/2024 CLINICAL DATA:  Hip trauma, fracture suspected. EXAM: CT OF THE LEFT HIP WITHOUT CONTRAST TECHNIQUE: Multidetector CT imaging of the left hip was performed according to the standard protocol. Multiplanar CT image reconstructions were also generated. RADIATION DOSE REDUCTION: This exam was performed according to the departmental dose-optimization program which includes automated exposure control, adjustment of the mA and/or kV according to patient size and/or use of iterative reconstruction technique. COMPARISON:  Pelvic and left hip radiographs same date. CT of the right hip 07/20/2020. PET-CT 09/02/2017. FINDINGS: Bones/Joint/Cartilage The technologist deviated from protocol and saved axial images through the entire pelvis. The usual small field-of-view coronal and sagittal images through the left hip are provided. The bones are mildly demineralized. There is a mildly impacted subcapital fracture of the left femoral neck.  There are nondisplaced acute parasymphyseal pubic rami fractures on the left. There are sacroiliac degenerative changes bilaterally without diastasis of the sacroiliac joints or symphysis pubis. Status post proximal right femoral ORIF with a healed fracture and posttraumatic deformity. Previous L4-5 fusion with multilevel lumbar spondylosis. Ligaments Suboptimally assessed by CT. Muscles and Tendons The pelvic and left hip musculature appears normal. Soft tissues Generalized subcutaneous edema throughout the pelvic fat with mild asymmetric involvement lateral to the proximal left femur. No focal hematoma, unexpected foreign body or soft tissue emphysema demonstrated. There are postsurgical changes within the low anterior abdominal wall. Calcified gallstones noted. IMPRESSION: 1. Mildly impacted subcapital fracture of the left femoral neck. 2. Nondisplaced acute parasymphyseal pubic rami fractures on the left. 3. Generalized subcutaneous edema throughout the pelvic fat with mild asymmetric involvement lateral to the proximal left femur. No focal hematoma, unexpected foreign body or soft tissue emphysema demonstrated. 4. Cholelithiasis. Electronically Signed   By: Elsie Perone M.D.   On: 06/02/2024 17:00   DG Wrist Complete Left Result Date: 06/02/2024 CLINICAL DATA:  Left wrist pain after fall today. EXAM: LEFT WRIST - COMPLETE 3+ VIEW COMPARISON:  None Available. FINDINGS: There is no evidence of fracture or dislocation. Severe degenerative changes seen involving the first carpometacarpal joint. Soft tissues are unremarkable. IMPRESSION: Severe osteoarthritis of first carpometacarpal joint. No acute abnormality seen. Electronically Signed   By: Lynwood Landy Raddle M.D.   On: 06/02/2024 15:43   DG Shoulder Left Result Date: 06/02/2024 CLINICAL DATA:  Left shoulder pain after fall today. EXAM: LEFT SHOULDER - 2+ VIEW COMPARISON:  None Available. FINDINGS: There is no evidence of fracture or dislocation. Moderate  degenerative changes seen involving  the left acromioclavicular joint. Soft tissues are unremarkable. IMPRESSION: Moderate degenerative joint disease of left acromioclavicular joint. No acute abnormality seen. Electronically Signed   By: Lynwood Landy Raddle M.D.   On: 06/02/2024 15:41   DG Knee Complete 4 Views Left Result Date: 06/02/2024 CLINICAL DATA:  Status post fall EXAM: LEFT KNEE - COMPLETE 4+ VIEW COMPARISON:  January 17, 2011 FINDINGS: Total left knee arthroplasty with near anatomic alignment No fractures Minimal suprapatellar joint effusion and infrapatellar swelling IMPRESSION: Total left knee arthroplasty with near anatomic alignment. Electronically Signed   By: Franky Chard M.D.   On: 06/02/2024 15:39   DG Hip Unilat W or Wo Pelvis 2-3 Views Left Result Date: 06/02/2024 CLINICAL DATA:  Left hip pain after fall. EXAM: DG HIP (WITH OR WITHOUT PELVIS) 2-3V LEFT COMPARISON:  July 20, 2020. FINDINGS: Status post surgical internal fixation of old proximal right femoral fracture. Probable mildly displaced subcapital fracture of proximal left femur is noted. No dislocation is noted. IMPRESSION: Probable mildly displaced proximal left femoral subcapital fracture. CT may be performed for further evaluation. Electronically Signed   By: Lynwood Landy Raddle M.D.   On: 06/02/2024 15:06   DG Chest 1 View Result Date: 06/02/2024 CLINICAL DATA:  Left hip pain after fall today. EXAM: CHEST  1 VIEW COMPARISON:  January 17, 2024. FINDINGS: Stable cardiomegaly. Both lungs are clear. The visualized skeletal structures are unremarkable. IMPRESSION: No active disease. Electronically Signed   By: Lynwood Landy Raddle M.D.   On: 06/02/2024 15:02   CT HEAD WO CONTRAST ( ) Result Date: 06/02/2024 CLINICAL DATA:  Head trauma, minor, normal mental status (Age 65-64y) EXAM: CT HEAD WITHOUT CONTRAST TECHNIQUE: Contiguous axial images were obtained from the base of the skull through the vertex without intravenous contrast. RADIATION  DOSE REDUCTION: This exam was performed according to the departmental dose-optimization program which includes automated exposure control, adjustment of the mA and/or kV according to patient size and/or use of iterative reconstruction technique. COMPARISON:  CT of the head dated December 26, 2018. FINDINGS: Brain: Chronic encephalomalacia changes in the left cerebellar hemisphere. Age-related cerebral volume loss and mild to moderate periventricular and deep cerebral white matter disease. No evidence of hemorrhage, mass, acute cortical infarct or hydrocephalus. Vascular: Mild calcific atheromatous disease within the carotid siphons and vertebral arteries. Skull: Intact. Sinuses/Orbits: Status post bilateral cataract surgery. Visualized paranasal sinuses and mastoid air cells are clear. Other: None. IMPRESSION: 1. Chronic encephalomalacia changes within the left cerebellar hemisphere and moderate cerebral white matter disease. No apparent acute process. Electronically Signed   By: Evalene Coho M.D.   On: 06/02/2024 15:00   CT Cervical Spine Wo Contrast Result Date: 06/02/2024 CLINICAL DATA:  Neck trauma. EXAM: CT CERVICAL SPINE WITHOUT CONTRAST TECHNIQUE: Multidetector CT imaging of the cervical spine was performed without intravenous contrast. Multiplanar CT image reconstructions were also generated. RADIATION DOSE REDUCTION: This exam was performed according to the departmental dose-optimization program which includes automated exposure control, adjustment of the mA and/or kV according to patient size and/or use of iterative reconstruction technique. COMPARISON:  Cervical spine radiographs 11/22/2006. FINDINGS: Alignment: Minimal stepwise anterolisthesis at C3-4 and C4-5. No focal angulation. Skull base and vertebrae: No evidence of acute cervical spine fracture or traumatic subluxation. Soft tissues and spinal canal: No prevertebral fluid or swelling. No visible canal hematoma. Disc levels: Multilevel  spondylosis with disc space narrowing and uncinate spurring most advanced at C5-6 and C6-7. Multilevel mild-to-moderate foraminal narrowing, greatest on the right at C5-6. Multilevel  facet arthropathy. No large disc herniation or high-grade central stenosis identified. Upper chest: Clear lung apices. Other: Mild carotid atherosclerosis. Mild thyroid  heterogeneity, similar to previous chest CTA 03/01/2023. Given the patient's age, no followup recommended unless clinically warranted.(Ref: J Am Coll Radiol. 2015 Feb;12(2): 143-50). IMPRESSION: 1. No evidence of acute cervical spine fracture, traumatic subluxation or static signs of instability. 2. Multilevel cervical spondylosis as described. Electronically Signed   By: Elsie Perone M.D.   On: 06/02/2024 14:50    Assessment & Plan:   Problem List Items Addressed This Visit     ABLA (acute blood loss anemia)   Iron  infusion is pending soon      Anemia   Iron  infusion is pending soon      Cough - Primary   URI Z pack given Use Delsym      Left shoulder pain   Blue-Emu cream was recommended to use 2-3 times a day          Meds ordered this encounter  Medications   azithromycin  (ZITHROMAX  Z-PAK) 250 MG tablet    Sig: As directed    Dispense:  6 tablet    Refill:  0      Follow-up: No follow-ups on file.  Marolyn Noel, MD

## 2024-08-12 NOTE — Telephone Encounter (Signed)
 FYI Only or Action Required?: FYI only for provider.  Patient was last seen in primary care on 07/24/2024 by Plotnikov, Karlynn GAILS, MD.  Called Nurse Triage reporting Weakness.  Symptoms began yesterday.  Interventions attempted: Rest, hydration, or home remedies.  Symptoms are: gradually worsening.  Triage Disposition: See HCP Within 4 Hours (Or PCP Triage)  Patient/caregiver understands and will follow disposition?: Yes  Copied from CRM #[id number]. Topic: Clinical - Pink Word Triage >> Aug 12, 2024  8:09 AM [name]: Reason for Triage: Patient seems lethargic >> Aug 12, 2024  8:10 AM [name]: Patient's son called in because the sitter said that yesterday patient kept her eyes closed all day and seems week. 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 Reason for Disposition  [1] MODERATE weakness (e.g., interferes with work, school, normal activities) AND [2] cause unknown  (Exceptions: Weakness from acute minor illness or poor fluid intake; weakness is chronic and not worse.)  Answer Assessment - Initial Assessment Questions 1. DESCRIPTION: Describe how you are feeling.     Anxious want to get it over with, anticipating iron  transfusion this week but not due until next week. Her energy is low.  2. SEVERITY: How bad is it?  Can you stand and walk?     Doing ok but energy is decreasing, feels she is due for iron  infusion 3. ONSET: When did these symptoms begin? (e.g., hours, days, weeks, months)     Yesterday 08/11/24 4. CAUSE: What do you think is causing the weakness or fatigue? (e.g., not drinking enough fluids, medical problem, trouble sleeping)     Iron  levels  5. NEW MEDICINES:  Have you started on any new medicines recently? (e.g., opioid pain medicines, benzodiazepines, muscle relaxants, antidepressants, antihistamines, neuroleptics, beta blockers)      6. OTHER SYMPTOMS: Do you have any other symptoms? (e.g., chest pain, fever, cough, SOB, vomiting,  diarrhea, bleeding, other areas of pain)     Nasal congestion  Additional info: Spoke with son [name] and [name]. Breanna [name] joined call and answered all questions, she reports her energy is decreasing and feels it is due to her iron  levels, she also notes she is coming down with upper respiratory illness, currently having congestion.  Protocols used: Weakness (Generalized) and Fatigue-A-AH

## 2024-08-17 ENCOUNTER — Telehealth: Payer: Self-pay

## 2024-08-17 ENCOUNTER — Other Ambulatory Visit: Payer: Self-pay | Admitting: Hematology

## 2024-08-17 ENCOUNTER — Inpatient Hospital Stay (HOSPITAL_BASED_OUTPATIENT_CLINIC_OR_DEPARTMENT_OTHER): Admitting: Hematology

## 2024-08-17 DIAGNOSIS — N1832 Chronic kidney disease, stage 3b: Secondary | ICD-10-CM | POA: Diagnosis not present

## 2024-08-17 DIAGNOSIS — D649 Anemia, unspecified: Secondary | ICD-10-CM | POA: Diagnosis not present

## 2024-08-17 DIAGNOSIS — N179 Acute kidney failure, unspecified: Secondary | ICD-10-CM | POA: Diagnosis not present

## 2024-08-17 DIAGNOSIS — K625 Hemorrhage of anus and rectum: Secondary | ICD-10-CM | POA: Diagnosis not present

## 2024-08-17 DIAGNOSIS — S72012D Unspecified intracapsular fracture of left femur, subsequent encounter for closed fracture with routine healing: Secondary | ICD-10-CM | POA: Diagnosis not present

## 2024-08-17 DIAGNOSIS — Z96642 Presence of left artificial hip joint: Secondary | ICD-10-CM | POA: Diagnosis not present

## 2024-08-17 DIAGNOSIS — D631 Anemia in chronic kidney disease: Secondary | ICD-10-CM | POA: Insufficient documentation

## 2024-08-17 DIAGNOSIS — S32592D Other specified fracture of left pubis, subsequent encounter for fracture with routine healing: Secondary | ICD-10-CM | POA: Diagnosis not present

## 2024-08-17 DIAGNOSIS — D46Z Other myelodysplastic syndromes: Secondary | ICD-10-CM

## 2024-08-17 DIAGNOSIS — D500 Iron deficiency anemia secondary to blood loss (chronic): Secondary | ICD-10-CM | POA: Diagnosis not present

## 2024-08-17 DIAGNOSIS — Z8042 Family history of malignant neoplasm of prostate: Secondary | ICD-10-CM | POA: Diagnosis not present

## 2024-08-17 DIAGNOSIS — Z803 Family history of malignant neoplasm of breast: Secondary | ICD-10-CM | POA: Diagnosis not present

## 2024-08-17 DIAGNOSIS — E1122 Type 2 diabetes mellitus with diabetic chronic kidney disease: Secondary | ICD-10-CM | POA: Diagnosis not present

## 2024-08-17 DIAGNOSIS — M47812 Spondylosis without myelopathy or radiculopathy, cervical region: Secondary | ICD-10-CM | POA: Diagnosis not present

## 2024-08-17 DIAGNOSIS — I10 Essential (primary) hypertension: Secondary | ICD-10-CM | POA: Diagnosis not present

## 2024-08-17 NOTE — Telephone Encounter (Signed)
 Copied from CRM #[id number]. Topic: General - Other >> Aug 17, 2024  1:00 PM [name] I wrote: Reason for CRM:  [name] from [facility] was calling to notify that patient is being discharged from home health effective today. Patient reports having cough with clear sputum mostly when laying down. Patient was prescribed ZITHROMAX  which was completed yesterday, overall patient reports feeling better. If any additional questions he can be contacted at [phone].

## 2024-08-18 ENCOUNTER — Ambulatory Visit (INDEPENDENT_AMBULATORY_CARE_PROVIDER_SITE_OTHER)

## 2024-08-18 VITALS — BP 153/66 | HR 45 | Temp 98.4°F | Resp 16 | Ht 62.0 in | Wt 108.0 lb

## 2024-08-18 DIAGNOSIS — D46Z Other myelodysplastic syndromes: Secondary | ICD-10-CM

## 2024-08-18 DIAGNOSIS — D500 Iron deficiency anemia secondary to blood loss (chronic): Secondary | ICD-10-CM

## 2024-08-18 MED ORDER — SODIUM CHLORIDE 0.9 % IV SOLN
300.0000 mg | Freq: Once | INTRAVENOUS | Status: AC
  Administered 2024-08-18: 300 mg via INTRAVENOUS
  Filled 2024-08-18: qty 15

## 2024-08-18 NOTE — Progress Notes (Signed)
 Diagnosis: Iron  Deficiency Anemia  Provider:  Mannam, Praveen MD  Procedure: IV Infusion  IV Type: Peripheral, IV Location: L Antecubital  Venofer  (Iron  Sucrose), Dose: 300 mg  Infusion Start Time: 1101  Infusion Stop Time: 1244  Post Infusion IV Care: Observation period completed and Peripheral IV Discontinued  Discharge: Condition: Good, Destination: Home . AVS Provided  Performed by:  Rocky FORBES Sar, RN

## 2024-08-18 NOTE — Telephone Encounter (Signed)
 Noted! Thank you

## 2024-08-25 ENCOUNTER — Ambulatory Visit (INDEPENDENT_AMBULATORY_CARE_PROVIDER_SITE_OTHER)

## 2024-08-25 ENCOUNTER — Other Ambulatory Visit: Payer: Self-pay | Admitting: Pharmacist

## 2024-08-25 ENCOUNTER — Encounter: Payer: Self-pay | Admitting: Hematology

## 2024-08-25 VITALS — BP 136/68 | HR 51 | Temp 97.9°F | Resp 16 | Ht 62.0 in | Wt 109.0 lb

## 2024-08-25 DIAGNOSIS — D46Z Other myelodysplastic syndromes: Secondary | ICD-10-CM

## 2024-08-25 DIAGNOSIS — D500 Iron deficiency anemia secondary to blood loss (chronic): Secondary | ICD-10-CM

## 2024-08-25 MED ORDER — SODIUM CHLORIDE 0.9 % IV SOLN
300.0000 mg | Freq: Once | INTRAVENOUS | Status: AC
  Administered 2024-08-25: 300 mg via INTRAVENOUS
  Filled 2024-08-25: qty 15

## 2024-08-25 NOTE — Progress Notes (Signed)
 HEMATOLOGY/ONCOLOGY PHONE VISIT NOTE  Date of Service: .08/17/2024   Patient Care Team: Garald Karlynn GAILS, MD as PCP - General (Internal Medicine) Chales Idol, MD as Consulting Physician (Urology) Burnetta Aures, MD as Consulting Physician (Orthopedic Surgery) Geneva Norleen NOVAK, MD as Referring Physician (Ophthalmology) Caresse Cough, MD as Referring Physician (Ophthalmology) Gaynel Delon CROME, DPM as Consulting Physician (Podiatry) Santo Stanly LABOR, MD as Consulting Physician (Cardiology) Clement Odor, LCSW as Social Worker (Licensed Clinical Social Worker)  CHIEF COMPLAINTS/PURPOSE OF CONSULTATION:  Evaluation and management of anemia  HISTORY OF PRESENTING ILLNESS:   Breanna [name] is a wonderful 89 y.o. female who has been referred back to us  by Plotnikov, Karlynn GAILS, MD for evaluation and management of iron  deficiency anemia.    [email or web address] connected with Angela Netta Hurst on 08/17/2024 at  8:40 AM EDT by telephone visit and verified that I am speaking with the correct person using two identifiers.   I discussed the limitations, risks, security and privacy concerns of performing an evaluation and management service by telemedicine and the availability of in-person appointments. I also discussed with the patient that there may be a patient responsible charge related to this service. The patient expressed understanding and agreed to proceed.   Other persons participating in the visit and their role in the encounter: patients son   Patient's location: home  Provider's location: [facility]   Chief Complaint: Lab results discussion for Anemia.   Patient notes no acute new symptoms since her clinic visit.  No overt notable GI bleeding. She has been scheduled for IV iron  at [facility] infusion already. We discussed all her available lab results. We discussed and patient is agreeable with Retacrit shots to address anemia of chronic inflammation in addition to her  iron  deficiency anemia.  MEDICAL HISTORY:  Past Medical History:  Diagnosis Date   Adrenal adenoma 12/24/2004   Boils 12/25/2007   Cholelithiasis    asympt. w/normal HIDA 03/2010   CVA (cerebral infarction) 12/24/2008   Cerebellar   Diverticulitis    Esophageal spasm 12/24/2009   GERD (gastroesophageal reflux disease)    Gout    History of colon polyps    HTN (hypertension)    Hydronephrosis    LEFT/ Surgical intervention   Hyperlipidemia    LBP (low back pain)    Osteoarthritis    Pulmonary HTN (HCC)    Stress    Type II or unspecified type diabetes mellitus without mention of complication, not stated as uncontrolled     SURGICAL HISTORY: Past Surgical History:  Procedure Laterality Date   ABDOMINAL HYSTERECTOMY     APPENDECTOMY     2020   BACK SURGERY     BREAST BIOPSY     RIGHT   CATARACT EXTRACTION, BILATERAL     COLECTOMY  2006   Sigmoid   COLONOSCOPY Left 01/20/2024   Procedure: COLONOSCOPY;  Surgeon: Abran Norleen SAILOR, MD;  Location: [facility];  Service: Gastroenterology;  Laterality: Left;   ESOPHAGOGASTRODUODENOSCOPY Left 01/19/2024   Procedure: ESOPHAGOGASTRODUODENOSCOPY (EGD);  Surgeon: San Sandor GAILS, DO;  Location: [facility];  Service: Gastroenterology;  Laterality: Left;   FOOT SURGERY     BILATERAL   HAMMER TOE SURGERY     HIP ARTHROPLASTY Left 06/03/2024   Procedure: HEMIARTHROPLASTY (BIPOLAR) HIP, POSTERIOR APPROACH FOR FRACTURE;  Surgeon: Edna Toribio LABOR, MD;  Location: [facility];  Service: Orthopedics;  Laterality: Left;  LEFT HIP HEMIARTHROPLASTY LATERAL POSITION WITH CEMENT   INTRAMEDULLARY (IM) NAIL INTERTROCHANTERIC Right 07/21/2020  Procedure: INTRAMEDULLARY (IM) NAIL INTERTROCHANTRIC;  Surgeon: Beverley Evalene BIRCH, MD;  Location: [facility];  Service: Orthopedics;  Laterality: Right;   ROTATOR CUFF REPAIR  2008   RIGHT   TOTAL KNEE ARTHROPLASTY  2003   LEFT   VARICOSE VEIN SURGERY     vein stripping/lower extremities    SOCIAL  HISTORY: Social History   Socioeconomic History   Marital status: Widowed    Spouse name: Avaree Gilberti   Number of children: 1   Years of education: Not on file   Highest education level: 12th grade  Occupational History   Occupation: Retired    Associate Professor: RETIRED  Tobacco Use   Smoking status: Never   Smokeless tobacco: Never  Vaping Use   Vaping status: Never Used  Substance and Sexual Activity   Alcohol  use: No    Alcohol /week: 0.0 standard drinks of alcohol    Drug use: No   Sexual activity: Not Currently  Other Topics Concern   Not on file  Social History Narrative   Lives with son.   Social Drivers of Corporate investment banker Strain: Low Risk  (07/24/2024)   Overall Financial Resource Strain (CARDIA)    Difficulty of Paying Living Expenses: Not hard at all  Food Insecurity: No Food Insecurity (07/27/2024)   Hunger Vital Sign    Worried About Running Out of Food in the Last Year: Never true    Ran Out of Food in the Last Year: Never true  Transportation Needs: No Transportation Needs (07/27/2024)   PRAPARE - Administrator, Civil Service (Medical): No    Lack of Transportation (Non-Medical): No  Physical Activity: Inactive (07/24/2024)   Exercise Vital Sign    Days of Exercise per Week: 0 days    Minutes of Exercise per Session: Not on file  Stress: No Stress Concern Present (07/24/2024)   [employer] of Occupational Health - Occupational Stress Questionnaire    Feeling of Stress: Only a little  Social Connections: Moderately Integrated (07/24/2024)   Social Connection and Isolation Panel    Frequency of Communication with Friends and Family: More than three times a week    Frequency of Social Gatherings with Friends and Family: Three times a week    Attends Religious Services: More than 4 times per year    Active Member of Clubs or Organizations: Yes    Attends Banker Meetings: More than 4 times per year    Marital Status: Widowed   Intimate Partner Violence: Not At Risk (07/27/2024)   Humiliation, Afraid, Rape, and Kick questionnaire    Fear of Current or Ex-Partner: No    Emotionally Abused: No    Physically Abused: No    Sexually Abused: No    FAMILY HISTORY: Family History  Problem Relation Age of Onset   Prostate cancer Father    Hypertension Father    Cancer Father        prostate   Heart disease Mother    Diabetes Mother    Diabetes Other        1st degree relative   Heart disease Other    Hypertension Other    Prostate cancer Maternal Uncle    Cancer Maternal Uncle        prostate   Breast cancer Daughter    Cancer Daughter        breast   Colon cancer Neg Hx     ALLERGIES:  is allergic to verapamil, aspirin , atenolol, codeine, codeine  sulfate, hydrochlorothiazide, hydrocodone , ibuprofen , lisinopril, onion, shellfish allergy, statins, valsartan, adhesive  [tape], and other.  MEDICATIONS:  Current Outpatient Medications  Medication Sig Dispense Refill   acetaminophen  (TYLENOL ) 325 MG tablet Take 2 tablets (650 mg total) by mouth every 4 (four) hours.     allopurinol  (ZYLOPRIM ) 100 MG tablet Take 0.5 tablets (50 mg total) by mouth every other day.     amLODipine  (NORVASC ) 10 MG tablet Take 1 tablet (10 mg total) by mouth daily. 90 tablet 3   artificial tears ophthalmic solution Place 1 drop into both eyes as needed for dry eyes.     atropine  1 % ophthalmic solution Place 1 drop into both eyes daily.     azithromycin  (ZITHROMAX  Z-PAK) 250 MG tablet As directed 6 tablet 0   calcium  carbonate (OSCAL) 1500 (600 Ca) MG TABS tablet Take 1,500 mg by mouth daily.     carvedilol  (COREG ) 12.5 MG tablet Take 1 tablet (12.5 mg total) by mouth 2 (two) times daily with a meal. 60 tablet 11   cholecalciferol  (CHOLECALCIFEROL ) 25 MCG tablet Take 1 tablet (1,000 Units total) by mouth daily.     [Paused] [employer] VITAMIN B12 1000 MCG tablet TAKE 1 TABLET (1,000 MCG TOTAL) BY MOUTH EVERY DAY 90 tablet 3   docusate  sodium (COLACE) 100 MG capsule TAKE 1 CAPSULE BY MOUTH TWICE A DAY 180 capsule 3   dorzolamide -timolol  (COSOPT ) 22.3-6.8 MG/ML ophthalmic solution Place 1 drop into both eyes 2 (two) times daily.      feeding supplement (ENSURE PLUS HIGH PROTEIN) LIQD Take 237 mLs by mouth 2 (two) times daily between meals.     ferrous sulfate  325 (65 FE) MG tablet Take 1 tablet (325 mg total) by mouth daily with breakfast.     gabapentin  (NEURONTIN ) 100 MG capsule Take 1 capsule (100 mg total) by mouth at bedtime.     glucose blood (GNP TRUE METRIX GLUCOSE STRIPS) test strip Use to check blood sugars twice a day 200 each 2   latanoprost  (XALATAN ) 0.005 % ophthalmic solution Place 1 drop into both eyes at bedtime.      [Paused] metFORMIN  (GLUCOPHAGE ) 500 MG tablet Take 500 mg by mouth daily with breakfast.     ondansetron  (ZOFRAN -ODT) 4 MG disintegrating tablet Take 1 tablet (4 mg total) by mouth every 8 (eight) hours as needed for nausea or vomiting.     pantoprazole  (PROTONIX ) 40 MG tablet TAKE 1 TABLET BY MOUTH EVERY DAY 90 tablet 3   polyethylene glycol (MIRALAX  / GLYCOLAX ) 17 g packet Take 17 g by mouth daily as needed for mild constipation.     rosuvastatin  (CRESTOR ) 5 MG tablet Take 5 mg by mouth daily.     senna (SENOKOT) 8.6 MG TABS tablet Take 1 tablet (8.6 mg total) by mouth at bedtime. While taking opioids     sertraline  (ZOLOFT ) 100 MG tablet Take 1 tablet (100 mg total) by mouth at bedtime. 90 tablet 1   spironolactone  (ALDACTONE ) 25 MG tablet Take 25 mg by mouth 3 (three) times a week.     torsemide  (DEMADEX ) 20 MG tablet Take 1 tablet (20 mg total) by mouth daily as needed. Take if needed once per day for lower extremity swelling. If you are having to take more than twice per week, please call and let us  know 30 tablet 2   No current facility-administered medications for this visit.    REVIEW OF SYSTEMS:   .10 Point review of Systems was done is negative  except as noted above. PHYSICAL  EXAMINATION: Telemedicine visit LABORATORY DATA:  I have reviewed the data as listed  .    Latest Ref Rng & Units 07/24/2024    2:18 PM 06/24/2024   12:26 PM 06/06/2024    6:10 AM  CBC  WBC 4.0 - 10.5 K/uL 4.5  4.5  7.8   Hemoglobin 12.0 - 15.0 g/dL 8.3  9.4  7.6   Hematocrit 36.0 - 46.0 % 25.7  29.1  23.2   Platelets 150.0 - 400.0 K/uL 149.0  195.0  103    .CBC    Component Value Date/Time   WBC 4.5 07/24/2024 1418   RBC 3.06 (L) 07/27/2024 1241   RBC 2.94 (L) 07/24/2024 1418   HGB 8.3 (L) 07/24/2024 1418   HCT 25.7 (L) 07/24/2024 1418   PLT 149.0 (L) 07/24/2024 1418   MCV 87.6 07/24/2024 1418   MCH 27.0 06/06/2024 0610   MCHC 32.1 07/24/2024 1418   RDW 17.8 (H) 07/24/2024 1418   LYMPHSABS 0.8 07/24/2024 1418   MONOABS 0.4 07/24/2024 1418   EOSABS 0.1 07/24/2024 1418   BASOSABS 0.0 07/24/2024 1418    .    Latest Ref Rng & Units 06/24/2024   12:26 PM 06/06/2024    6:10 AM 06/05/2024    6:46 AM  CMP  Glucose 70 - 99 mg/dL 872  882  92   BUN 6 - 23 mg/dL 22  24  23    Creatinine 0.40 - 1.20 mg/dL 8.75  8.56  8.42   Sodium 135 - 145 mEq/L 141  138  138   Potassium 3.5 - 5.1 mEq/L 4.6  3.6  3.8   Chloride 96 - 112 mEq/L 106  108  108   CO2 19 - 32 mEq/L 29  23  23    Calcium  8.4 - 10.5 mg/dL 9.3  8.4  8.0   Total Protein 6.0 - 8.3 g/dL 7.1     Total Bilirubin 0.2 - 1.2 mg/dL 0.4     Alkaline Phos 39 - 117 U/L 124     AST 0 - 37 U/L 18     ALT 0 - 35 U/L 8      . Lab Results  Component Value Date   IRON  48 07/27/2024   TIBC 248 (L) 07/27/2024   IRONPCTSAT 19 07/27/2024   (Iron  and TIBC)  Lab Results  Component Value Date   FERRITIN 366 (H) 07/27/2024     RADIOGRAPHIC STUDIES: I have personally reviewed the radiological images as listed and agreed with the findings in the report. No results found.  ASSESSMENT & PLAN:   89 y.o. female with multiple medical co-morbids with progressive normocytic Anemia . Patient Active Problem List   Diagnosis Date Noted    Anemia of chronic kidney failure 08/17/2024   MDS (myelodysplastic syndrome), low grade (HCC) 08/17/2024   IDA (iron  deficiency anemia) 08/03/2024   Heel pain, bilateral 06/24/2024   Stage 3a chronic kidney disease (CKD) (HCC) 06/06/2024   Closed subcapital fracture of neck of femur, left, initial encounter (HCC) 06/02/2024   Insomnia 02/26/2024   Pressure sore 02/14/2024   Lower gastrointestinal bleeding 01/23/2024   Acute blood loss anemia 01/22/2024   Diverticulosis of colon with hemorrhage 01/20/2024   ABLA (acute blood loss anemia) 01/18/2024   Heme positive stool 01/18/2024   Anemia 01/18/2024   Thrombocytopenia (HCC) 01/17/2024   Blindness of both eyes 10/17/2023   Stress    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   Headache 10/16/2023  Ankle pain, left 06/14/2023   Leg abrasion 06/14/2023   Elevated troponin 03/01/2023   Depression 03/01/2023   Left shoulder pain 02/28/2023   Pes anserinus bursitis of both knees 05/23/2022   Gait disorder 03/07/2022   Falls frequently 11/29/2021   Vision loss 11/29/2021   Parkinsonian features 05/23/2021   Atherosclerosis of aorta (HCC) 05/23/2021   Normocytic anemia 08/18/2020   Constipation 08/18/2020   Sinus bradycardia 07/21/2020   Hip pain 07/06/2020   Acute pain of right knee 09/28/2018   Well adult exam 05/13/2018   Night sweats 04/04/2018   Breast symptom 10/30/2017   Diarrhea 08/21/2017   Lung nodule 08/21/2017   FTT (failure to thrive) in adult 08/21/2017   Shortness of breath 08/13/2017   Cough 08/13/2017   Fatigue 08/13/2017   Blurry vision 08/13/2017   Shoulder pain, right 07/25/2017   Callus of foot 06/07/2017   Need for prophylactic vaccination and inoculation against influenza 11/21/2016   Trigger finger, acquired 11/12/2016   Meteorism 07/11/2016   Dark stools 01/10/2016   Osteoporosis 07/13/2015   Cornea disorder 11/22/2014   Primary open angle glaucoma of both eyes, indeterminate stage 11/22/2014    Secondary corneal edema 10/14/2014   Swelling of left knee joint 04/07/2014   Grief 01/04/2014   Syncope 01/04/2014   Thoracic spine pain 10/22/2013   Hypernatremia 07/07/2013   Diabetic neuropathy (HCC) 07/30/2012   Neck pain on right side 07/30/2012   Cystoid macular edema 06/19/2012   History of corneal transplant 06/19/2012   Weight loss 04/10/2012   PARESTHESIA 01/03/2011   Full incontinence of feces 01/01/2011   LEG PAIN, LEFT 10/02/2010   History of cardioembolic cerebrovascular accident (CVA) 03/27/2010   GERD (gastroesophageal reflux disease) 03/27/2010   CHOLELITHIASIS 03/27/2010   Cerebral artery occlusion with cerebral infarction (HCC) 03/27/2010   LOW BACK PAIN 09/07/2009   History of anemia due to chronic kidney disease 06/14/2009   Dysphagia 05/16/2009   Dyslipidemia 11/06/2007   Hyperlipidemia 11/06/2007   DM2 (diabetes mellitus, type 2) (HCC) 07/29/2007   Gout 07/29/2007   HTN (hypertension) 07/29/2007   Hydronephrosis 07/29/2007   Osteoarthritis 07/29/2007     Progressive Normocytic Anemia Patient had closed # of left hip s/ p hemiarthroplasty on 6/11 with acute blood loss anemia Intermittent rectal bleeding.  EGD abd colonoscopy in 12/2023 -- thought to be diverticular. PLAN: Lab results from 07/27/2024 in detail with the patient and her son .  WBC count of 4.5 with platelets of 149k Iron  labs show iron  saturation less than 20% and elevated ferritin is acute phase reactant Proceed with IV iron  as scheduled We discussed that the primary etiology appears to be iron  deficiency anemia from GI bleeding but given her recent Her age there is also possibility of MDS or from chronic kidney disease. Discussed and patient is agreeable to starting Retacrit shots to keep her hemoglobin above 10 No hemolysis noted IV iron  at [facility] infusion -can develop iron  and B vit deficiency from chronic PPI use -can hold po iron . - Patient's husband: Follow-up with the  patient with gastroenterologist to evaluate for ongoing GI losses.  . No orders of the defined types were placed in this encounter.   FOLLOW-UP:  IV iron  as scheduled Retacrit with labs every 4 weeks x 6 to start as soon as possible  .The total time spent in the appointment was 30 minutes* .  All of the patient's questions were answered with apparent satisfaction. The patient knows to call the clinic  with any problems, questions or concerns.   Emaline Saran MD MS AAHIVMS [facility] Hematology/Oncology Physician [facility]  .*Total Encounter Time as defined by the Centers for Medicare and Medicaid Services includes, in addition to the face-to-face time of a patient visit (documented in the note above) non-face-to-face time: obtaining and reviewing outside history, ordering and reviewing medications, tests or procedures, care coordination (communications with other health care professionals or caregivers) and documentation in the medical record.

## 2024-08-25 NOTE — Progress Notes (Signed)
 Diagnosis: Iron  Deficiency Anemia  Provider:  Mannam, Praveen MD  Procedure: IV Infusion  IV Type: Peripheral, IV Location: L Upper Arm  Venofer  (Iron  Sucrose), Dose: 300 mg  Infusion Start Time: 1054  Infusion Stop Time: 1234  Post Infusion IV Care: Observation period completed and Peripheral IV Discontinued  Discharge: Condition: Good, Destination: Home . AVS Provided  Performed by:  Rocky FORBES Sar, RN

## 2024-08-26 ENCOUNTER — Ambulatory Visit (INDEPENDENT_AMBULATORY_CARE_PROVIDER_SITE_OTHER): Admitting: Licensed Clinical Social Worker

## 2024-08-26 ENCOUNTER — Encounter: Payer: Self-pay | Admitting: Licensed Clinical Social Worker

## 2024-08-26 DIAGNOSIS — H5316 Psychophysical visual disturbances: Secondary | ICD-10-CM

## 2024-08-26 NOTE — Progress Notes (Signed)
 [facility] Counselor/Therapist Progress Note  Patient ID: Breanna [name], MRN: [id number]    Date: 08/26/24  Time Spent: 11:03  am - 12:00 pm : 57 Minutes  Treatment Type: Individual Therapy.  Reported Symptoms: reports she has been sedentary, adjusting to the vision loss and the need for help and support, open heart gratitude for what they push aside to support her  Mental Status Exam: Appearance:  Well Groomed     Behavior: Appropriate and Care-Taking  Motor: Normal  Speech/Language:  Normal Rate  Affect: Appropriate  Mood: normal  Thought process: normal  Thought content:   WNL  Sensory/Perceptual disturbances:   WNL  Orientation: oriented to person, place, and time/date  Attention: Good  Concentration: Good  Memory: WNL  Fund of knowledge:  Good  Insight:   Good  Judgment:  Good  Impulse Control: Good   Risk Assessment: Danger to Self:  No Self-injurious Behavior: No Danger to Others: No Duty to Warn:no Physical Aggression / Violence:No  Access to Firearms a concern: No  Gang Involvement:No   Subjective:   [employer] participated in the session, in person in the office with the therapist, and consented to treatment. Breanna [name] reviewed the events of the past week. Has an aid that is coming to the home along with her children taking turns.  She has been home quite a bit and has been having physical therapy and enjoys this.  She has also been going for the last two weeks for iron  injections.  Has had a lot of medical appointments.     Interventions: Narrative and Insight-Oriented  Diagnosis:   Carlin Abrahams syndrome  Psychiatric Treatment: No , N/A  Treatment Plan:  Client Abilities/Strengths Breanna [name] is open to sessions.    Support System: Family and Friends   Client Treatment Preferences In person  Client Statement of Needs Breanna [name] would like to utilize sessions to discuss things of concern as she adjusts to her new diagnosis.  She would  like her family to understand she is still independent as well.     Treatment Level Monthly  Symptoms  Grateful   (Status: maintained) Optimistic   (Status: maintained)  Goals:   Breanna [name] experiences symptoms of managing anxiety waiting for her appointment for her injections.  She has some frustration with her family members to be more supportive of her independence.  She is fearful of offending some of her family members but she tries to keep the peace since they are supporting her.  She would like to continue to take care of the things she can but needs more consistency with her caregivers.  They move things around and do not communicate these things with her so she becomes frustrated and they do as well. She is concerned about her children's well being due to their own health issues and aging.  Her goal is to keep the peace and make things easy for them.     Target Date: 08/26/24 Frequency: Monthly  Progress: 0 Modality: individual    Therapist will provide referrals for additional resources as appropriate.  Therapist will provide psycho-education regarding Breanna [name]'s diagnosis and corresponding treatment approaches and interventions. Licensed Clinical Mental Health Counselor, Tawni Louder, [facility] will support the patient's ability to achieve the goals identified. will employ CBT, BA, Problem-solving, Solution Focused, Mindfulness,  coping skills, & other evidenced-based practices will be used to promote progress towards healthy functioning to help manage decrease symptoms associated with her diagnosis.   Reduce overall level, frequency, and  intensity of the feelings of depression, anxiety and panic evidenced by decreased feelings of frustration and increased concession to help.   Verbally express understanding of the relationship between feelings of depression, anxiety and their impact on thinking patterns and behaviors. Verbalize an understanding of the role that distorted thinking plays in  creating fears, excessive worry, and ruminations.  [name] participated in the creation of the treatment plan)    Tawni Louder, LCMHC

## 2024-09-01 ENCOUNTER — Ambulatory Visit (INDEPENDENT_AMBULATORY_CARE_PROVIDER_SITE_OTHER)

## 2024-09-01 VITALS — BP 147/79 | HR 50 | Temp 97.9°F | Resp 16 | Ht 62.0 in

## 2024-09-01 DIAGNOSIS — D46Z Other myelodysplastic syndromes: Secondary | ICD-10-CM

## 2024-09-01 DIAGNOSIS — D500 Iron deficiency anemia secondary to blood loss (chronic): Secondary | ICD-10-CM

## 2024-09-01 MED ORDER — SODIUM CHLORIDE 0.9 % IV SOLN
300.0000 mg | Freq: Once | INTRAVENOUS | Status: AC
  Administered 2024-09-01: 300 mg via INTRAVENOUS
  Filled 2024-09-01: qty 15

## 2024-09-01 NOTE — Progress Notes (Signed)
 Diagnosis: Iron  Deficiency Anemia  Provider:  Mannam, Praveen MD  Procedure: IV Infusion  IV Type: Peripheral, IV Location: R Forearm  Venofer  (Iron  Sucrose), Dose: 300 mg  Infusion Start Time: 1051  Infusion Stop Time: 1229  Post Infusion IV Care: Observation period completed and Peripheral IV Discontinued  Discharge: Condition: Good, Destination: Home . AVS Declined  Performed by:  Rocky FORBES Sar, RN

## 2024-09-07 ENCOUNTER — Telehealth: Payer: Self-pay | Admitting: Radiology

## 2024-09-07 DIAGNOSIS — D62 Acute posthemorrhagic anemia: Secondary | ICD-10-CM

## 2024-09-07 NOTE — Telephone Encounter (Signed)
 Copied from CRM [id number]. Topic: Clinical - Request for Lab/Test Order >> Sep 07, 2024 10:32 AM [name] wrote: Reason for CRM: [name] had iron  infusion on 09/01/24 patient would like a lab draw to know iron  levels.   Please call [name] when labs have been ordered phone [phone]   Patient was made aware their call will be returned in 1 business day.

## 2024-09-11 NOTE — Addendum Note (Signed)
 Addended by: Sebasthian Stailey V on: 09/11/2024 08:07 AM   Modules accepted: Orders

## 2024-09-11 NOTE — Telephone Encounter (Signed)
 Spoke with patients son, he advised I reach out to patients home phone number. Called patients home and spoke with patient and her daughter. Gave information, patient mentions she has been feeling like she has sand in her eyes, she does have an eye doctor (Dr.Bond with [facility]), and she will reach out to him but wanted to make us  aware. Patient has a F/U with Dr.P on 10/02 and will get lab work at that visit

## 2024-09-11 NOTE — Telephone Encounter (Signed)
 We should wait for another couple weeks before we check blood test. Other tests are ordered. Schedule a follow-up appointment with Dr. Onesimo. Thanks

## 2024-09-15 ENCOUNTER — Other Ambulatory Visit: Payer: Self-pay

## 2024-09-15 ENCOUNTER — Telehealth: Payer: Self-pay | Admitting: Hematology

## 2024-09-15 ENCOUNTER — Inpatient Hospital Stay: Attending: Hematology

## 2024-09-15 ENCOUNTER — Inpatient Hospital Stay

## 2024-09-15 VITALS — BP 154/69 | HR 42 | Temp 98.6°F | Resp 16

## 2024-09-15 DIAGNOSIS — D46Z Other myelodysplastic syndromes: Secondary | ICD-10-CM

## 2024-09-15 DIAGNOSIS — N184 Chronic kidney disease, stage 4 (severe): Secondary | ICD-10-CM

## 2024-09-15 DIAGNOSIS — D649 Anemia, unspecified: Secondary | ICD-10-CM | POA: Insufficient documentation

## 2024-09-15 LAB — CMP (CANCER CENTER ONLY)
ALT: 8 U/L (ref 0–44)
AST: 16 U/L (ref 15–41)
Albumin: 3.7 g/dL (ref 3.5–5.0)
Alkaline Phosphatase: 84 U/L (ref 38–126)
Anion gap: 6 (ref 5–15)
BUN: 44 mg/dL — ABNORMAL HIGH (ref 8–23)
CO2: 30 mmol/L (ref 22–32)
Calcium: 9.2 mg/dL (ref 8.9–10.3)
Chloride: 105 mmol/L (ref 98–111)
Creatinine: 1.44 mg/dL — ABNORMAL HIGH (ref 0.44–1.00)
GFR, Estimated: 34 mL/min — ABNORMAL LOW (ref >60)
Glucose, Bld: 71 mg/dL (ref 70–99)
Potassium: 3.8 mmol/L (ref 3.5–5.1)
Sodium: 141 mmol/L (ref 135–145)
Total Bilirubin: 0.3 mg/dL (ref 0.0–1.2)
Total Protein: 7.5 g/dL (ref 6.5–8.1)

## 2024-09-15 LAB — CBC WITH DIFFERENTIAL (CANCER CENTER ONLY)
Abs Immature Granulocytes: 0.01 K/uL (ref 0.00–0.07)
Basophils Absolute: 0 K/uL (ref 0.0–0.1)
Basophils Relative: 0 %
Eosinophils Absolute: 0.1 K/uL (ref 0.0–0.5)
Eosinophils Relative: 4 %
HCT: 27.4 % — ABNORMAL LOW (ref 36.0–46.0)
Hemoglobin: 9.1 g/dL — ABNORMAL LOW (ref 12.0–15.0)
Immature Granulocytes: 0 %
Lymphocytes Relative: 25 %
Lymphs Abs: 0.9 K/uL (ref 0.7–4.0)
MCH: 28 pg (ref 26.0–34.0)
MCHC: 33.2 g/dL (ref 30.0–36.0)
MCV: 84.3 fL (ref 80.0–100.0)
Monocytes Absolute: 0.4 K/uL (ref 0.1–1.0)
Monocytes Relative: 11 %
Neutro Abs: 2.1 K/uL (ref 1.7–7.7)
Neutrophils Relative %: 60 %
Platelet Count: 119 K/uL — ABNORMAL LOW (ref 150–400)
RBC: 3.25 MIL/uL — ABNORMAL LOW (ref 3.87–5.11)
RDW: 17.8 % — ABNORMAL HIGH (ref 11.5–15.5)
WBC Count: 3.5 K/uL — ABNORMAL LOW (ref 4.0–10.5)
nRBC: 0 % (ref 0.0–0.2)

## 2024-09-15 MED ORDER — EPOETIN ALFA-EPBX 20000 UNIT/ML IJ SOLN
20000.0000 [IU] | Freq: Once | INTRAMUSCULAR | Status: AC
  Administered 2024-09-15: 20000 [IU] via SUBCUTANEOUS
  Filled 2024-09-15: qty 1

## 2024-09-15 NOTE — Patient Instructions (Signed)

## 2024-09-22 ENCOUNTER — Ambulatory Visit: Admitting: Licensed Clinical Social Worker

## 2024-09-22 DIAGNOSIS — H5316 Psychophysical visual disturbances: Secondary | ICD-10-CM

## 2024-09-22 NOTE — Progress Notes (Signed)
 [facility] Counselor/Therapist Progress Note  Patient ID: Breanna [name], MRN: [id number]    Date: 09/22/24  Time Spent: 11:05  pm - 12:00 pm : 55 Minutes  Treatment Type: Individual Therapy.  Reported Symptoms: has been able to turn off the vision hallucinations and it went away, suffering from constipation due to iron  infusions  Mental Status Exam: Appearance:  Well Groomed     Behavior: Appropriate and Sharing  Motor: Normal  Speech/Language:  Normal Rate  Affect: Appropriate  Mood: normal  Thought process: normal  Thought content:   WNL  Sensory/Perceptual disturbances:   WNL  Orientation: oriented to person, place, and time/date  Attention: Good  Concentration: Good  Memory: WNL  Fund of knowledge:  Good  Insight:   Good  Judgment:  Good  Impulse Control: Good   Risk Assessment: Danger to Self:  No Self-injurious Behavior: No Danger to Others: No Duty to Warn:no Physical Aggression / Violence:No  Access to Firearms a concern: No  Gang Involvement:No   Subjective:   Breanna [name] participated in the session, in person in the office with the therapist, and consented to treatment. Breanna [name] reviewed the events of the past week. Patient is adjusting to Breanna [name] and now being fully blind. She reports no longer experiencing regular hallucinations. She described one recent episode of visual hallucinations (items casting on the walls), but was able to recognize it as a hallucination, self-correct, and refocus. Patient expressed gratitude for family support with self-care tasks such as bathing and mobility at home. She appeared in good spirits, laughing and expressing feeling more optimistic.     Interventions: Assertiveness/Communication, Solution-Oriented/Positive Psychology, and Insight-Oriented  [name]:   Breanna [name]   Psychiatric Treatment: No , N/A  Treatment Plan:  Client Abilities/Strengths Breanna [name] is open to sessions.     Support System: Family and Friends  Client Treatment Preferences In person  Client Statement of Needs Breanna [name] would like to utilize sessions to discuss things of concern as she adjusts to her new [name].  She would like her family to understand she is still independent as well.    Patient presented as engaged, alert, and emotionally regulated. Mood was elevated and affect was congruent with reported improvement. No signs of ongoing hallucinations during session. Patient was open and reflective in her communication.  Treatment Level Monthly  Symptoms  More in control   (Status: improved) Asserting/Independent   (Status: improved)  Goals:   Breanna [name] Patient is demonstrating increased insight and effective coping with visual hallucinations related to Breanna [name]. Emotional adjustment to vision loss appears to be progressing positively, supported by family involvement. Visualization exercises continue to be a helpful therapeutic tool. Caregiver reminded to access and share video with patient. Encourage continued use of audio-based resources (audiobooks, Acupuncturist) for coping and enrichment. Continue to monitor emotional adjustment and coping strategies in future sessions.   Target Date: 10/23/24 Frequency: Monthly  Progress: 0 Modality: individual    Therapist will provide referrals for additional resources as appropriate.  Therapist will provide psycho-education regarding Breanna [name] and corresponding treatment approaches and interventions. Licensed Clinical Mental Health Counselor, Tawni Louder, [facility] will support the patient's ability to achieve the goals identified. will employ CBT, BA, Problem-solving, Solution Focused, Mindfulness,  coping skills, & other evidenced-based practices will be used to promote progress towards healthy functioning to help manage decrease symptoms associated with her [name].   Reduce overall level, frequency, and intensity of the  feelings of depression, anxiety and  panic evidenced by decreased feelings of loss of control due to blindness hallucinations.   Verbally express understanding of the relationship between feelings of depression, anxiety and their impact on thinking patterns and behaviors. Verbalize an understanding of the role that distorted thinking plays in creating fears, excessive worry, and ruminations.  Breanna [name] participated in the creation of the treatment plan)    Tawni Louder, LCMHC

## 2024-09-23 DIAGNOSIS — H401133 Primary open-angle glaucoma, bilateral, severe stage: Secondary | ICD-10-CM | POA: Diagnosis not present

## 2024-09-23 DIAGNOSIS — Z947 Corneal transplant status: Secondary | ICD-10-CM | POA: Diagnosis not present

## 2024-09-23 DIAGNOSIS — Z961 Presence of intraocular lens: Secondary | ICD-10-CM | POA: Diagnosis not present

## 2024-09-24 ENCOUNTER — Encounter: Payer: Self-pay | Admitting: Internal Medicine

## 2024-09-24 ENCOUNTER — Ambulatory Visit: Admitting: Internal Medicine

## 2024-09-24 VITALS — BP 112/62 | HR 52 | Temp 98.0°F | Ht 62.0 in | Wt 111.8 lb

## 2024-09-24 DIAGNOSIS — H547 Unspecified visual loss: Secondary | ICD-10-CM | POA: Diagnosis not present

## 2024-09-24 DIAGNOSIS — R296 Repeated falls: Secondary | ICD-10-CM | POA: Diagnosis not present

## 2024-09-24 DIAGNOSIS — Z23 Encounter for immunization: Secondary | ICD-10-CM

## 2024-09-24 DIAGNOSIS — E1142 Type 2 diabetes mellitus with diabetic polyneuropathy: Secondary | ICD-10-CM

## 2024-09-24 DIAGNOSIS — E1121 Type 2 diabetes mellitus with diabetic nephropathy: Secondary | ICD-10-CM

## 2024-09-24 DIAGNOSIS — F32A Depression, unspecified: Secondary | ICD-10-CM | POA: Diagnosis not present

## 2024-09-24 DIAGNOSIS — R269 Unspecified abnormalities of gait and mobility: Secondary | ICD-10-CM | POA: Diagnosis not present

## 2024-09-24 DIAGNOSIS — I10 Essential (primary) hypertension: Secondary | ICD-10-CM | POA: Diagnosis not present

## 2024-09-24 NOTE — Assessment & Plan Note (Signed)
  Take Losartan  50 mg with breakfast. Take Amlodipine  10 mg with supper. Take Spironolactone  3/wk BP Readings from Last 3 Encounters:  09/24/24 112/62  09/15/24 (!) 154/69  09/01/24 (!) 147/79

## 2024-09-24 NOTE — Assessment & Plan Note (Signed)
Better - Gabapentin 300 mg tid prn (am, supper and 3 am if needed) Blue-Emu cream  use 2-3 times a day

## 2024-09-24 NOTE — Assessment & Plan Note (Signed)
Home PT/OT

## 2024-09-24 NOTE — Assessment & Plan Note (Signed)
 Home PT/OT Using a w/c

## 2024-09-24 NOTE — Assessment & Plan Note (Addendum)
 She is blind.  Discussed fall prevention

## 2024-09-24 NOTE — Addendum Note (Signed)
 Addended by: ROSALVA LEX RAMAN on: 09/24/2024 03:41 PM   Modules accepted: Orders

## 2024-09-24 NOTE — Assessment & Plan Note (Signed)
Chronic On Metformin 

## 2024-09-24 NOTE — Progress Notes (Signed)
 Subjective:  Patient ID: Breanna [name], female    DOB: [birth year]  Age: 89 y.o. MRN: [id number]  CC: Follow-up (Patients states she is waiting to hear weather or not she improved in her iron  levels. Wants to know if any of this has to do with her memory she states she is having delayed memory. )   HPI [address] [place] presents for memory loss, anemia, HTN  Outpatient Medications Prior to Visit  Medication Sig Dispense Refill   acetaminophen  (TYLENOL ) 325 MG tablet Take 2 tablets (650 mg total) by mouth every 4 (four) hours.     allopurinol  (ZYLOPRIM ) 100 MG tablet Take 0.5 tablets (50 mg total) by mouth every other day.     amLODipine  (NORVASC ) 10 MG tablet Take 1 tablet (10 mg total) by mouth daily. 90 tablet 3   artificial tears ophthalmic solution Place 1 drop into both eyes as needed for dry eyes.     atropine  1 % ophthalmic solution Place 1 drop into both eyes daily.     azithromycin  (ZITHROMAX  Z-PAK) 250 MG tablet As directed 6 tablet 0   calcium  carbonate (OSCAL) 1500 (600 Ca) MG TABS tablet Take 1,500 mg by mouth daily.     carvedilol  (COREG ) 12.5 MG tablet Take 1 tablet (12.5 mg total) by mouth 2 (two) times daily with a meal. 60 tablet 11   cholecalciferol  (CHOLECALCIFEROL ) 25 MCG tablet Take 1 tablet (1,000 Units total) by mouth daily.     [employer] VITAMIN B12 1000 MCG tablet TAKE 1 TABLET (1,000 MCG TOTAL) BY MOUTH EVERY DAY 90 tablet 3   docusate sodium  (COLACE) 100 MG capsule TAKE 1 CAPSULE BY MOUTH TWICE A DAY 180 capsule 3   dorzolamide -timolol  (COSOPT ) 22.3-6.8 MG/ML ophthalmic solution Place 1 drop into both eyes 2 (two) times daily.      epoetin  alfa (EPOGEN ) 2000 UNIT/ML injection      feeding supplement (ENSURE PLUS HIGH PROTEIN) LIQD Take 237 mLs by mouth 2 (two) times daily between meals.     ferrous sulfate  325 (65 FE) MG tablet Take 1 tablet (325 mg total) by mouth daily with breakfast.     gabapentin  (NEURONTIN ) 100 MG capsule Take 1 capsule (100 mg total) by mouth at  bedtime.     glucose blood (GNP TRUE METRIX GLUCOSE STRIPS) test strip Use to check blood sugars twice a day 200 each 2   latanoprost  (XALATAN ) 0.005 % ophthalmic solution Place 1 drop into both eyes at bedtime.      metFORMIN  (GLUCOPHAGE ) 500 MG tablet Take 500 mg by mouth daily with breakfast.     ondansetron  (ZOFRAN -ODT) 4 MG disintegrating tablet Take 1 tablet (4 mg total) by mouth every 8 (eight) hours as needed for nausea or vomiting.     pantoprazole  (PROTONIX ) 40 MG tablet TAKE 1 TABLET BY MOUTH EVERY DAY 90 tablet 3   polyethylene glycol (MIRALAX  / GLYCOLAX ) 17 g packet Take 17 g by mouth daily as needed for mild constipation.     rosuvastatin  (CRESTOR ) 5 MG tablet Take 5 mg by mouth daily.     senna (SENOKOT) 8.6 MG TABS tablet Take 1 tablet (8.6 mg total) by mouth at bedtime. While taking opioids     sertraline  (ZOLOFT ) 100 MG tablet Take 1 tablet (100 mg total) by mouth at bedtime. 90 tablet 1   spironolactone  (ALDACTONE ) 25 MG tablet Take 25 mg by mouth 3 (three) times a week.     torsemide  (DEMADEX ) 20 MG tablet Take  1 tablet (20 mg total) by mouth daily as needed. Take if needed once per day for lower extremity swelling. If you are having to take more than twice per week, please call and let us  know 30 tablet 2   No facility-administered medications prior to visit.    ROS: Review of Systems  Constitutional:  Positive for fatigue. Negative for activity change, appetite change, chills and unexpected weight change.  HENT:  Negative for congestion, mouth sores and sinus pressure.   Eyes:  Positive for visual disturbance.  Respiratory:  Negative for cough and chest tightness.   Gastrointestinal:  Positive for constipation. Negative for abdominal pain and nausea.  Genitourinary:  Positive for frequency. Negative for difficulty urinating and vaginal pain.  Musculoskeletal:  Positive for arthralgias, back pain and gait problem.  Skin:  Negative for pallor and rash.  Neurological:   Positive for weakness. Negative for dizziness, tremors, numbness and headaches.  Hematological:  Bruises/bleeds easily.  Psychiatric/Behavioral:  Positive for confusion, decreased concentration and sleep disturbance. Negative for hallucinations and suicidal ideas.     Objective:  BP 112/62   Pulse (!) 52   Temp 98 F (36.7 C) (Oral)   Ht 5' 2 (1.575 m)   Wt 111 lb 12.8 oz (50.7 kg)   SpO2 98%   BMI 20.45 kg/m   BP Readings from Last 3 Encounters:  09/24/24 112/62  09/15/24 (!) 154/69  09/01/24 (!) 147/79    Wt Readings from Last 3 Encounters:  09/24/24 111 lb 12.8 oz (50.7 kg)  08/25/24 109 lb (49.4 kg)  08/18/24 108 lb (49 kg)    Physical Exam Constitutional:      General: She is not in acute distress.    Appearance: She is well-developed. She is not ill-appearing or toxic-appearing.  HENT:     Head: Normocephalic.     Right Ear: External ear normal.     Left Ear: External ear normal.     Nose: Nose normal.  Eyes:     General:        Right eye: No discharge.        Left eye: No discharge.     Conjunctiva/sclera: Conjunctivae normal.     Pupils: Pupils are equal, round, and reactive to light.  Neck:     Thyroid : No thyromegaly.     Vascular: No JVD.     Trachea: No tracheal deviation.  Cardiovascular:     Rate and Rhythm: Normal rate and regular rhythm.     Heart sounds: Normal heart sounds.  Pulmonary:     Effort: No respiratory distress.     Breath sounds: No stridor. No wheezing.  Abdominal:     General: Bowel sounds are normal. There is no distension.     Palpations: Abdomen is soft. There is no mass.     Tenderness: There is no abdominal tenderness. There is no guarding or rebound.  Musculoskeletal:        General: No tenderness.     Cervical back: Normal range of motion and neck supple. No rigidity.     Right lower leg: No edema.     Left lower leg: No edema.  Lymphadenopathy:     Cervical: No cervical adenopathy.  Skin:    General: Skin is  warm.     Findings: No erythema or rash.  Neurological:     Mental Status: Mental status is at baseline.     Cranial Nerves: No cranial nerve deficit.     Motor:  Weakness present. No abnormal muscle tone.     Coordination: Coordination abnormal.     Gait: Gait abnormal.     Deep Tendon Reflexes: Reflexes normal.  Psychiatric:        Behavior: Behavior normal.        Thought Content: Thought content normal.        Judgment: Judgment normal.   Blind Thin In a w/c  Lab Results  Component Value Date   WBC 3.5 (L) 09/15/2024   HGB 9.1 (L) 09/15/2024   HCT 27.4 (L) 09/15/2024   PLT 119 (L) 09/15/2024   GLUCOSE 71 09/15/2024   CHOL 127 03/01/2023   TRIG 42 03/01/2023   HDL 53 03/01/2023   LDLCALC 66 03/01/2023   ALT 8 09/15/2024   AST 16 09/15/2024   NA 141 09/15/2024   K 3.8 09/15/2024   CL 105 09/15/2024   CREATININE 1.44 (H) 09/15/2024   BUN 44 (H) 09/15/2024   CO2 30 09/15/2024   TSH 0.702 07/27/2024   HGBA1C 6.3 06/24/2024    CT HIP LEFT WO CONTRAST Result Date: 06/02/2024 CLINICAL DATA:  Hip trauma, fracture suspected. EXAM: CT OF THE LEFT HIP WITHOUT CONTRAST TECHNIQUE: Multidetector CT imaging of the left hip was performed according to the standard protocol. Multiplanar CT image reconstructions were also generated. RADIATION DOSE REDUCTION: This exam was performed according to the departmental dose-optimization program which includes automated exposure control, adjustment of the mA and/or kV according to patient size and/or use of iterative reconstruction technique. COMPARISON:  Pelvic and left hip radiographs same date. CT of the right hip 07/20/2020. PET-CT 09/02/2017. FINDINGS: Bones/Joint/Cartilage The technologist deviated from protocol and saved axial images through the entire pelvis. The usual small field-of-view coronal and sagittal images through the left hip are provided. The bones are mildly demineralized. There is a mildly impacted subcapital fracture of the  left femoral neck. There are nondisplaced acute parasymphyseal pubic rami fractures on the left. There are sacroiliac degenerative changes bilaterally without diastasis of the sacroiliac joints or symphysis pubis. Status post proximal right femoral ORIF with a healed fracture and posttraumatic deformity. Previous L4-5 fusion with multilevel lumbar spondylosis. Ligaments Suboptimally assessed by CT. Muscles and Tendons The pelvic and left hip musculature appears normal. Soft tissues Generalized subcutaneous edema throughout the pelvic fat with mild asymmetric involvement lateral to the proximal left femur. No focal hematoma, unexpected foreign body or soft tissue emphysema demonstrated. There are postsurgical changes within the low anterior abdominal wall. Calcified gallstones noted. IMPRESSION: 1. Mildly impacted subcapital fracture of the left femoral neck. 2. Nondisplaced acute parasymphyseal pubic rami fractures on the left. 3. Generalized subcutaneous edema throughout the pelvic fat with mild asymmetric involvement lateral to the proximal left femur. No focal hematoma, unexpected foreign body or soft tissue emphysema demonstrated. 4. Cholelithiasis. Electronically Signed   By: Elsie Perone M.D.   On: 06/02/2024 17:00   DG Wrist Complete Left Result Date: 06/02/2024 CLINICAL DATA:  Left wrist pain after fall today. EXAM: LEFT WRIST - COMPLETE 3+ VIEW COMPARISON:  None Available. FINDINGS: There is no evidence of fracture or dislocation. Severe degenerative changes seen involving the first carpometacarpal joint. Soft tissues are unremarkable. IMPRESSION: Severe osteoarthritis of first carpometacarpal joint. No acute abnormality seen. Electronically Signed   By: Lynwood Landy Raddle M.D.   On: 06/02/2024 15:43   DG Shoulder Left Result Date: 06/02/2024 CLINICAL DATA:  Left shoulder pain after fall today. EXAM: LEFT SHOULDER - 2+ VIEW COMPARISON:  None Available. FINDINGS:  There is no evidence of fracture or  dislocation. Moderate degenerative changes seen involving the left acromioclavicular joint. Soft tissues are unremarkable. IMPRESSION: Moderate degenerative joint disease of left acromioclavicular joint. No acute abnormality seen. Electronically Signed   By: Lynwood Landy Raddle M.D.   On: 06/02/2024 15:41   DG Knee Complete 4 Views Left Result Date: 06/02/2024 CLINICAL DATA:  Status post fall EXAM: LEFT KNEE - COMPLETE 4+ VIEW COMPARISON:  January 17, 2011 FINDINGS: Total left knee arthroplasty with near anatomic alignment No fractures Minimal suprapatellar joint effusion and infrapatellar swelling IMPRESSION: Total left knee arthroplasty with near anatomic alignment. Electronically Signed   By: Franky Chard M.D.   On: 06/02/2024 15:39   DG Hip Unilat W or Wo Pelvis 2-3 Views Left Result Date: 06/02/2024 CLINICAL DATA:  Left hip pain after fall. EXAM: DG HIP (WITH OR WITHOUT PELVIS) 2-3V LEFT COMPARISON:  July 20, 2020. FINDINGS: Status post surgical internal fixation of old proximal right femoral fracture. Probable mildly displaced subcapital fracture of proximal left femur is noted. No dislocation is noted. IMPRESSION: Probable mildly displaced proximal left femoral subcapital fracture. CT may be performed for further evaluation. Electronically Signed   By: Lynwood Landy Raddle M.D.   On: 06/02/2024 15:06   DG Chest 1 View Result Date: 06/02/2024 CLINICAL DATA:  Left hip pain after fall today. EXAM: CHEST  1 VIEW COMPARISON:  January 17, 2024. FINDINGS: Stable cardiomegaly. Both lungs are clear. The visualized skeletal structures are unremarkable. IMPRESSION: No active disease. Electronically Signed   By: Lynwood Landy Raddle M.D.   On: 06/02/2024 15:02   CT HEAD WO CONTRAST ( ) Result Date: 06/02/2024 CLINICAL DATA:  Head trauma, minor, normal mental status (Age 5-64y) EXAM: CT HEAD WITHOUT CONTRAST TECHNIQUE: Contiguous axial images were obtained from the base of the skull through the vertex without  intravenous contrast. RADIATION DOSE REDUCTION: This exam was performed according to the departmental dose-optimization program which includes automated exposure control, adjustment of the mA and/or kV according to patient size and/or use of iterative reconstruction technique. COMPARISON:  CT of the head dated December 26, 2018. FINDINGS: Brain: Chronic encephalomalacia changes in the left cerebellar hemisphere. Age-related cerebral volume loss and mild to moderate periventricular and deep cerebral white matter disease. No evidence of hemorrhage, mass, acute cortical infarct or hydrocephalus. Vascular: Mild calcific atheromatous disease within the carotid siphons and vertebral arteries. Skull: Intact. Sinuses/Orbits: Status post bilateral cataract surgery. Visualized paranasal sinuses and mastoid air cells are clear. Other: None. IMPRESSION: 1. Chronic encephalomalacia changes within the left cerebellar hemisphere and moderate cerebral white matter disease. No apparent acute process. Electronically Signed   By: Evalene Coho M.D.   On: 06/02/2024 15:00   CT Cervical Spine Wo Contrast Result Date: 06/02/2024 CLINICAL DATA:  Neck trauma. EXAM: CT CERVICAL SPINE WITHOUT CONTRAST TECHNIQUE: Multidetector CT imaging of the cervical spine was performed without intravenous contrast. Multiplanar CT image reconstructions were also generated. RADIATION DOSE REDUCTION: This exam was performed according to the departmental dose-optimization program which includes automated exposure control, adjustment of the mA and/or kV according to patient size and/or use of iterative reconstruction technique. COMPARISON:  Cervical spine radiographs 11/22/2006. FINDINGS: Alignment: Minimal stepwise anterolisthesis at C3-4 and C4-5. No focal angulation. Skull base and vertebrae: No evidence of acute cervical spine fracture or traumatic subluxation. Soft tissues and spinal canal: No prevertebral fluid or swelling. No visible canal  hematoma. Disc levels: Multilevel spondylosis with disc space narrowing and uncinate spurring most advanced at  C5-6 and C6-7. Multilevel mild-to-moderate foraminal narrowing, greatest on the right at C5-6. Multilevel facet arthropathy. No large disc herniation or high-grade central stenosis identified. Upper chest: Clear lung apices. Other: Mild carotid atherosclerosis. Mild thyroid  heterogeneity, similar to previous chest CTA 03/01/2023. Given the patient's age, no followup recommended unless clinically warranted.(Ref: J Am Coll Radiol. 2015 Feb;12(2): 143-50). IMPRESSION: 1. No evidence of acute cervical spine fracture, traumatic subluxation or static signs of instability. 2. Multilevel cervical spondylosis as described. Electronically Signed   By: Elsie Perone M.D.   On: 06/02/2024 14:50    Assessment & Plan:   Problem List Items Addressed This Visit     Depression   Blindness is contributing      Diabetic neuropathy (HCC)   Better - Gabapentin  300 mg tid prn (am, supper and 3 am if needed) Blue-Emu cream  use 2-3 times a day      DM2 (diabetes mellitus, type 2) (HCC) - Primary   Chronic On Metformin       Falls frequently   Home PT/OT      Gait disorder   Home PT/OT Using a w/c      HTN (hypertension)    Take Losartan  50 mg with breakfast. Take Amlodipine  10 mg with supper. Take Spironolactone  3/wk BP Readings from Last 3 Encounters:  09/24/24 112/62  09/15/24 (!) 154/69  09/01/24 (!) 147/79            No orders of the defined types were placed in this encounter.     Follow-up: No follow-ups on file.  Marolyn Noel, MD

## 2024-09-24 NOTE — Assessment & Plan Note (Signed)
 Blindness is contributing

## 2024-09-29 ENCOUNTER — Ambulatory Visit: Payer: Self-pay | Admitting: *Deleted

## 2024-09-29 NOTE — Telephone Encounter (Signed)
 FYI Only or Action Required?: FYI only for provider.  Patient was last seen in primary care on 09/24/2024 by Plotnikov, Karlynn GAILS, MD.  Called Nurse Triage reporting No chief complaint on file..  Symptoms began yesterday.  Interventions attempted: Nothing.  Symptoms are: unchanged.  Triage Disposition: See Physician Within 24 Hours  Patient/caregiver understands and will follow disposition?: Yes Patient requesting afternoon appointment- patient has been scheduled Thursday appointment- advised notify office if she has any changes in her symptoms before that appointment.  Reason for Disposition  [1] Can't control passage of urine (i.e., urinary incontinence) AND [2] new-onset (< 2 weeks) or worsening  Answer Assessment - Initial Assessment Questions 1. SYMPTOM: What's the main symptom you're concerned about? (e.g., frequency, incontinence)     Patient states she feels the urge to urinate- but when she goes to urinate- she doesn't feel normal. Is is hard for her to describe her symptoms- she feels urge to go- but feels she is not going normally.  2. ONSET: When did the  changes  start?     yesterday 3. PAIN: Is there any pain? If Yes, ask: How bad is it? (Scale: 1-10; mild, moderate, severe)     No pain 4. CAUSE: What do you think is causing the symptoms?     Unsure- has started iron  infusions recently 5. OTHER SYMPTOMS: Do you have any other symptoms? (e.g., blood in urine, fever, flank pain, pain with urination)     Feet and legs- stinging and swelling- half way up the leg since yesterday- but no other symptoms  Protocols used: Urinary Symptoms-A-AH   Copied from CRM #[id number]. Topic: Clinical - Red Word Triage >> Sep 29, 2024  2:41 PM Jasmin G wrote: Red Word that prompted transfer to Nurse Triage: Difficulty urinating and swelling on feet and legs since yesterday.

## 2024-10-01 ENCOUNTER — Ambulatory Visit (INDEPENDENT_AMBULATORY_CARE_PROVIDER_SITE_OTHER): Admitting: Internal Medicine

## 2024-10-01 VITALS — BP 110/62 | HR 44 | Temp 98.6°F | Ht 61.0 in | Wt 120.8 lb

## 2024-10-01 DIAGNOSIS — D62 Acute posthemorrhagic anemia: Secondary | ICD-10-CM | POA: Diagnosis not present

## 2024-10-01 DIAGNOSIS — E1121 Type 2 diabetes mellitus with diabetic nephropathy: Secondary | ICD-10-CM

## 2024-10-01 DIAGNOSIS — N3090 Cystitis, unspecified without hematuria: Secondary | ICD-10-CM

## 2024-10-01 DIAGNOSIS — N10 Acute tubulo-interstitial nephritis: Secondary | ICD-10-CM | POA: Diagnosis not present

## 2024-10-01 MED ORDER — CIPROFLOXACIN HCL 250 MG PO TABS: 250.0000 mg | ORAL_TABLET | Freq: Two times a day (BID) | ORAL | 0 refills | Status: AC

## 2024-10-01 NOTE — Assessment & Plan Note (Addendum)
 Rule out pyelonephritis.  Prescribed Cipro  p.o.

## 2024-10-01 NOTE — Progress Notes (Signed)
 Subjective:  Patient ID: Breanna [name], female    DOB: Aug 16, 1931  Age: 89 y.o. MRN: [id number]  CC: Urinary Tract Infection (Urinary pressure. States she couldn't pee and when she did it was hard and slow. Not currently happening but she wanted to have it checked out. States nothing else to discuss. States iron  transfusions are helping. )   HPI [address] [place] presents for ?UTI, anemia follow-up She is here with her son [name]  Outpatient Medications Prior to Visit  Medication Sig Dispense Refill   acetaminophen  (TYLENOL ) 325 MG tablet Take 2 tablets (650 mg total) by mouth every 4 (four) hours.     allopurinol  (ZYLOPRIM ) 100 MG tablet Take 0.5 tablets (50 mg total) by mouth every other day.     amLODipine  (NORVASC ) 10 MG tablet Take 1 tablet (10 mg total) by mouth daily. (Patient not taking: Reported on 10/07/2024) 90 tablet 3   artificial tears ophthalmic solution Place 1 drop into both eyes as needed for dry eyes.     atropine  1 % ophthalmic solution Place 1 drop into both eyes daily.     azithromycin  (ZITHROMAX  Z-PAK) 250 MG tablet As directed 6 tablet 0   calcium  carbonate (OSCAL) 1500 (600 Ca) MG TABS tablet Take 1,500 mg by mouth daily.     carvedilol  (COREG ) 12.5 MG tablet Take 1 tablet (12.5 mg total) by mouth 2 (two) times daily with a meal. 60 tablet 11   cholecalciferol  (CHOLECALCIFEROL ) 25 MCG tablet Take 1 tablet (1,000 Units total) by mouth daily.     docusate sodium  (COLACE) 100 MG capsule TAKE 1 CAPSULE BY MOUTH TWICE A DAY 180 capsule 3   dorzolamide -timolol  (COSOPT ) 22.3-6.8 MG/ML ophthalmic solution Place 1 drop into both eyes 2 (two) times daily.      epoetin  alfa (EPOGEN ) 2000 UNIT/ML injection      feeding supplement (ENSURE PLUS HIGH PROTEIN) LIQD Take 237 mLs by mouth 2 (two) times daily between meals.     ferrous sulfate  325 (65 FE) MG tablet Take 1 tablet (325 mg total) by mouth daily with breakfast.     glucose blood (GNP TRUE METRIX GLUCOSE STRIPS) test strip  Use to check blood sugars twice a day 200 each 2   latanoprost  (XALATAN ) 0.005 % ophthalmic solution Place 1 drop into both eyes at bedtime.      metFORMIN  (GLUCOPHAGE ) 500 MG tablet Take 500 mg by mouth daily with breakfast.     ondansetron  (ZOFRAN -ODT) 4 MG disintegrating tablet Take 1 tablet (4 mg total) by mouth every 8 (eight) hours as needed for nausea or vomiting. (Patient not taking: Reported on 10/07/2024)     pantoprazole  (PROTONIX ) 40 MG tablet TAKE 1 TABLET BY MOUTH EVERY DAY 90 tablet 3   polyethylene glycol (MIRALAX  / GLYCOLAX ) 17 g packet Take 17 g by mouth daily as needed for mild constipation.     rosuvastatin  (CRESTOR ) 5 MG tablet Take 5 mg by mouth daily.     senna (SENOKOT) 8.6 MG TABS tablet Take 1 tablet (8.6 mg total) by mouth at bedtime. While taking opioids     spironolactone  (ALDACTONE ) 25 MG tablet Take 25 mg by mouth 3 (three) times a week.     [employer] VITAMIN B12 1000 MCG tablet TAKE 1 TABLET (1,000 MCG TOTAL) BY MOUTH EVERY DAY 90 tablet 3   gabapentin  (NEURONTIN ) 100 MG capsule Take 1 capsule (100 mg total) by mouth at bedtime.     sertraline  (ZOLOFT ) 100 MG tablet Take  1 tablet (100 mg total) by mouth at bedtime. 90 tablet 1   torsemide  (DEMADEX ) 20 MG tablet Take 1 tablet (20 mg total) by mouth daily as needed. Take if needed once per day for lower extremity swelling. If you are having to take more than twice per week, please call and let us  know 30 tablet 2   No facility-administered medications prior to visit.    ROS: Review of Systems  Constitutional:  Negative for activity change, appetite change, chills, fatigue and unexpected weight change.  HENT:  Negative for congestion, mouth sores and sinus pressure.   Eyes:  Positive for visual disturbance.  Respiratory:  Negative for cough and chest tightness.   Gastrointestinal:  Negative for abdominal pain and nausea.  Genitourinary:  Positive for frequency. Negative for difficulty urinating, vaginal discharge and  vaginal pain.  Musculoskeletal:  Positive for arthralgias, back pain and gait problem.  Skin:  Negative for pallor and rash.  Neurological:  Negative for dizziness, tremors, weakness, numbness and headaches.  Psychiatric/Behavioral:  Positive for decreased concentration and sleep disturbance. Negative for confusion and suicidal ideas.     Objective:  BP 110/62   Pulse (!) 44   Temp 98.6 F (37 C) (Oral)   Ht 5' 1 (1.549 m)   Wt 120 lb 12.8 oz (54.8 kg)   SpO2 97%   BMI 22.82 kg/m   BP Readings from Last 3 Encounters:  10/01/24 110/62  09/24/24 112/62  09/15/24 (!) 154/69    Wt Readings from Last 3 Encounters:  10/01/24 120 lb 12.8 oz (54.8 kg)  09/24/24 111 lb 12.8 oz (50.7 kg)  08/25/24 109 lb (49.4 kg)    Physical Exam Constitutional:      General: She is not in acute distress.    Appearance: Normal appearance. She is well-developed.  HENT:     Head: Normocephalic.     Right Ear: External ear normal.     Left Ear: External ear normal.     Nose: Nose normal.  Eyes:     General:        Right eye: No discharge.        Left eye: No discharge.     Conjunctiva/sclera: Conjunctivae normal.     Pupils: Pupils are equal, round, and reactive to light.  Neck:     Thyroid : No thyromegaly.     Vascular: No JVD.     Trachea: No tracheal deviation.  Cardiovascular:     Rate and Rhythm: Normal rate and regular rhythm.     Heart sounds: Normal heart sounds.  Pulmonary:     Effort: No respiratory distress.     Breath sounds: No stridor. No wheezing.  Abdominal:     General: Bowel sounds are normal. There is no distension.     Palpations: Abdomen is soft. There is no mass.     Tenderness: There is no abdominal tenderness. There is no guarding or rebound.  Musculoskeletal:        General: No tenderness.     Cervical back: Normal range of motion and neck supple. No rigidity.     Right lower leg: No edema.     Left lower leg: No edema.  Lymphadenopathy:     Cervical: No  cervical adenopathy.  Skin:    Findings: No erythema or rash.  Neurological:     Mental Status: Mental status is at baseline.     Cranial Nerves: No cranial nerve deficit.     Motor: No abnormal  muscle tone.     Coordination: Coordination normal.     Deep Tendon Reflexes: Reflexes normal.  Psychiatric:        Behavior: Behavior normal.        Thought Content: Thought content normal.        Judgment: Judgment normal.   In a wheelchair  Lab Results  Component Value Date   WBC 3.5 (L) 09/15/2024   HGB 9.1 (L) 09/15/2024   HCT 27.4 (L) 09/15/2024   PLT 119 (L) 09/15/2024   GLUCOSE 71 09/15/2024   CHOL 127 03/01/2023   TRIG 42 03/01/2023   HDL 53 03/01/2023   LDLCALC 66 03/01/2023   ALT 8 09/15/2024   AST 16 09/15/2024   NA 141 09/15/2024   K 3.8 09/15/2024   CL 105 09/15/2024   CREATININE 1.44 (H) 09/15/2024   BUN 44 (H) 09/15/2024   CO2 30 09/15/2024   TSH 0.702 07/27/2024   HGBA1C 6.3 06/24/2024    CT HIP LEFT WO CONTRAST Result Date: 06/02/2024 CLINICAL DATA:  Hip trauma, fracture suspected. EXAM: CT OF THE LEFT HIP WITHOUT CONTRAST TECHNIQUE: Multidetector CT imaging of the left hip was performed according to the standard protocol. Multiplanar CT image reconstructions were also generated. RADIATION DOSE REDUCTION: This exam was performed according to the departmental dose-optimization program which includes automated exposure control, adjustment of the mA and/or kV according to patient size and/or use of iterative reconstruction technique. COMPARISON:  Pelvic and left hip radiographs same date. CT of the right hip 07/20/2020. PET-CT 09/02/2017. FINDINGS: Bones/Joint/Cartilage The technologist deviated from protocol and saved axial images through the entire pelvis. The usual small field-of-view coronal and sagittal images through the left hip are provided. The bones are mildly demineralized. There is a mildly impacted subcapital fracture of the left femoral neck. There are  nondisplaced acute parasymphyseal pubic rami fractures on the left. There are sacroiliac degenerative changes bilaterally without diastasis of the sacroiliac joints or symphysis pubis. Status post proximal right femoral ORIF with a healed fracture and posttraumatic deformity. Previous L4-5 fusion with multilevel lumbar spondylosis. Ligaments Suboptimally assessed by CT. Muscles and Tendons The pelvic and left hip musculature appears normal. Soft tissues Generalized subcutaneous edema throughout the pelvic fat with mild asymmetric involvement lateral to the proximal left femur. No focal hematoma, unexpected foreign body or soft tissue emphysema demonstrated. There are postsurgical changes within the low anterior abdominal wall. Calcified gallstones noted. IMPRESSION: 1. Mildly impacted subcapital fracture of the left femoral neck. 2. Nondisplaced acute parasymphyseal pubic rami fractures on the left. 3. Generalized subcutaneous edema throughout the pelvic fat with mild asymmetric involvement lateral to the proximal left femur. No focal hematoma, unexpected foreign body or soft tissue emphysema demonstrated. 4. Cholelithiasis. Electronically Signed   By: Elsie Perone M.D.   On: 06/02/2024 17:00   DG Wrist Complete Left Result Date: 06/02/2024 CLINICAL DATA:  Left wrist pain after fall today. EXAM: LEFT WRIST - COMPLETE 3+ VIEW COMPARISON:  None Available. FINDINGS: There is no evidence of fracture or dislocation. Severe degenerative changes seen involving the first carpometacarpal joint. Soft tissues are unremarkable. IMPRESSION: Severe osteoarthritis of first carpometacarpal joint. No acute abnormality seen. Electronically Signed   By: Lynwood Landy Raddle M.D.   On: 06/02/2024 15:43   DG Shoulder Left Result Date: 06/02/2024 CLINICAL DATA:  Left shoulder pain after fall today. EXAM: LEFT SHOULDER - 2+ VIEW COMPARISON:  None Available. FINDINGS: There is no evidence of fracture or dislocation. Moderate  degenerative changes seen  involving the left acromioclavicular joint. Soft tissues are unremarkable. IMPRESSION: Moderate degenerative joint disease of left acromioclavicular joint. No acute abnormality seen. Electronically Signed   By: Lynwood Landy Raddle M.D.   On: 06/02/2024 15:41   DG Knee Complete 4 Views Left Result Date: 06/02/2024 CLINICAL DATA:  Status post fall EXAM: LEFT KNEE - COMPLETE 4+ VIEW COMPARISON:  January 17, 2011 FINDINGS: Total left knee arthroplasty with near anatomic alignment No fractures Minimal suprapatellar joint effusion and infrapatellar swelling IMPRESSION: Total left knee arthroplasty with near anatomic alignment. Electronically Signed   By: Franky Chard M.D.   On: 06/02/2024 15:39   DG Hip Unilat W or Wo Pelvis 2-3 Views Left Result Date: 06/02/2024 CLINICAL DATA:  Left hip pain after fall. EXAM: DG HIP (WITH OR WITHOUT PELVIS) 2-3V LEFT COMPARISON:  July 20, 2020. FINDINGS: Status post surgical internal fixation of old proximal right femoral fracture. Probable mildly displaced subcapital fracture of proximal left femur is noted. No dislocation is noted. IMPRESSION: Probable mildly displaced proximal left femoral subcapital fracture. CT may be performed for further evaluation. Electronically Signed   By: Lynwood Landy Raddle M.D.   On: 06/02/2024 15:06   DG Chest 1 View Result Date: 06/02/2024 CLINICAL DATA:  Left hip pain after fall today. EXAM: CHEST  1 VIEW COMPARISON:  January 17, 2024. FINDINGS: Stable cardiomegaly. Both lungs are clear. The visualized skeletal structures are unremarkable. IMPRESSION: No active disease. Electronically Signed   By: Lynwood Landy Raddle M.D.   On: 06/02/2024 15:02   CT HEAD WO CONTRAST ( ) Result Date: 06/02/2024 CLINICAL DATA:  Head trauma, minor, normal mental status (Age 21-64y) EXAM: CT HEAD WITHOUT CONTRAST TECHNIQUE: Contiguous axial images were obtained from the base of the skull through the vertex without intravenous contrast. RADIATION  DOSE REDUCTION: This exam was performed according to the departmental dose-optimization program which includes automated exposure control, adjustment of the mA and/or kV according to patient size and/or use of iterative reconstruction technique. COMPARISON:  CT of the head dated December 26, 2018. FINDINGS: Brain: Chronic encephalomalacia changes in the left cerebellar hemisphere. Age-related cerebral volume loss and mild to moderate periventricular and deep cerebral white matter disease. No evidence of hemorrhage, mass, acute cortical infarct or hydrocephalus. Vascular: Mild calcific atheromatous disease within the carotid siphons and vertebral arteries. Skull: Intact. Sinuses/Orbits: Status post bilateral cataract surgery. Visualized paranasal sinuses and mastoid air cells are clear. Other: None. IMPRESSION: 1. Chronic encephalomalacia changes within the left cerebellar hemisphere and moderate cerebral white matter disease. No apparent acute process. Electronically Signed   By: Evalene Coho M.D.   On: 06/02/2024 15:00   CT Cervical Spine Wo Contrast Result Date: 06/02/2024 CLINICAL DATA:  Neck trauma. EXAM: CT CERVICAL SPINE WITHOUT CONTRAST TECHNIQUE: Multidetector CT imaging of the cervical spine was performed without intravenous contrast. Multiplanar CT image reconstructions were also generated. RADIATION DOSE REDUCTION: This exam was performed according to the departmental dose-optimization program which includes automated exposure control, adjustment of the mA and/or kV according to patient size and/or use of iterative reconstruction technique. COMPARISON:  Cervical spine radiographs 11/22/2006. FINDINGS: Alignment: Minimal stepwise anterolisthesis at C3-4 and C4-5. No focal angulation. Skull base and vertebrae: No evidence of acute cervical spine fracture or traumatic subluxation. Soft tissues and spinal canal: No prevertebral fluid or swelling. No visible canal hematoma. Disc levels: Multilevel  spondylosis with disc space narrowing and uncinate spurring most advanced at C5-6 and C6-7. Multilevel mild-to-moderate foraminal narrowing, greatest on the right at C5-6.  Multilevel facet arthropathy. No large disc herniation or high-grade central stenosis identified. Upper chest: Clear lung apices. Other: Mild carotid atherosclerosis. Mild thyroid  heterogeneity, similar to previous chest CTA 03/01/2023. Given the patient's age, no followup recommended unless clinically warranted.(Ref: J Am Coll Radiol. 2015 Feb;12(2): 143-50). IMPRESSION: 1. No evidence of acute cervical spine fracture, traumatic subluxation or static signs of instability. 2. Multilevel cervical spondylosis as described. Electronically Signed   By: Elsie Perone M.D.   On: 06/02/2024 14:50    Assessment & Plan:   Problem List Items Addressed This Visit     ABLA (acute blood loss anemia)   On iron  infusions.  Feeling better      DM2 (diabetes mellitus, type 2) (HCC)   Doing well      UTI (urinary tract infection)   Rule out pyelonephritis.  Prescribed Cipro  p.o.      Other Visit Diagnoses       Cystitis    -  Primary   Relevant Orders   CULTURE, URINE COMPREHENSIVE (Completed)   Urinalysis (Completed)         Meds ordered this encounter  Medications   ciprofloxacin  (CIPRO ) 250 MG tablet    Sig: Take 1 tablet (250 mg total) by mouth 2 (two) times daily.    Dispense:  14 tablet    Refill:  0      Follow-up: No follow-ups on file.  Marolyn Noel, MD

## 2024-10-02 LAB — URINALYSIS
Bilirubin Urine: NEGATIVE
Hgb urine dipstick: NEGATIVE
Ketones, ur: NEGATIVE
Leukocytes,Ua: NEGATIVE
Nitrite: NEGATIVE
Specific Gravity, Urine: <=1.005 — AB (ref 1.000–1.030)
Urine Glucose: NEGATIVE
Urobilinogen, UA: 0.2 (ref 0.0–1.0)
pH: 6.5 (ref 5.0–8.0)

## 2024-10-03 LAB — CULTURE, URINE COMPREHENSIVE: RESULT:: NO GROWTH

## 2024-10-05 ENCOUNTER — Other Ambulatory Visit: Payer: Self-pay | Admitting: Internal Medicine

## 2024-10-05 ENCOUNTER — Other Ambulatory Visit (HOSPITAL_BASED_OUTPATIENT_CLINIC_OR_DEPARTMENT_OTHER): Payer: Self-pay | Admitting: Family

## 2024-10-05 ENCOUNTER — Ambulatory Visit: Payer: Self-pay | Admitting: Internal Medicine

## 2024-10-05 DIAGNOSIS — I10 Essential (primary) hypertension: Secondary | ICD-10-CM

## 2024-10-07 ENCOUNTER — Ambulatory Visit: Admitting: Podiatry

## 2024-10-07 ENCOUNTER — Encounter: Payer: Self-pay | Admitting: Podiatry

## 2024-10-07 DIAGNOSIS — B351 Tinea unguium: Secondary | ICD-10-CM | POA: Diagnosis not present

## 2024-10-07 DIAGNOSIS — M2141 Flat foot [pes planus] (acquired), right foot: Secondary | ICD-10-CM | POA: Diagnosis not present

## 2024-10-07 DIAGNOSIS — E1151 Type 2 diabetes mellitus with diabetic peripheral angiopathy without gangrene: Secondary | ICD-10-CM

## 2024-10-07 DIAGNOSIS — E119 Type 2 diabetes mellitus without complications: Secondary | ICD-10-CM | POA: Diagnosis not present

## 2024-10-07 DIAGNOSIS — M79675 Pain in left toe(s): Secondary | ICD-10-CM

## 2024-10-07 DIAGNOSIS — Z0189 Encounter for other specified special examinations: Secondary | ICD-10-CM | POA: Diagnosis not present

## 2024-10-07 DIAGNOSIS — M2142 Flat foot [pes planus] (acquired), left foot: Secondary | ICD-10-CM

## 2024-10-07 DIAGNOSIS — L84 Corns and callosities: Secondary | ICD-10-CM | POA: Diagnosis not present

## 2024-10-07 DIAGNOSIS — M79674 Pain in right toe(s): Secondary | ICD-10-CM | POA: Diagnosis not present

## 2024-10-08 ENCOUNTER — Telehealth: Payer: Self-pay | Admitting: Internal Medicine

## 2024-10-08 NOTE — Telephone Encounter (Signed)
 Pt son dropped off paperwork from [employer] for Diabetic shoes paperwork is placed in the provider basket up front. Pt son is aware of the time frame for completion.  Please advise, Thanks

## 2024-10-09 NOTE — Telephone Encounter (Signed)
Forms placed on PCP desk for review and signature.

## 2024-10-11 NOTE — Progress Notes (Signed)
 Subjective:  Patient ID: Breanna [name], female    DOB: 02-20-1931,  MRN: [id number]  Breanna [name] presents to clinic today for for annual diabetic foot examination, at risk foot care. Pt has h/o NIDDM with PAD, and callus(es) bilateral heels and painful thick toenails that are difficult to trim. Painful toenails interfere with ambulation. Aggravating factors include wearing enclosed shoe gear. Pain is relieved with periodic professional debridement. Painful calluses are aggravated when weightbearing with and without shoegear. Pain is relieved with periodic professional debridement. Her daughter is present during today's visit. Chief Complaint  Patient presents with   Diabetes    [facility] Diet control diabetes. A1C 6.3.Toenail trim. LOV with PCP 10/01/24.   New problem(s): None.   PCP is Plotnikov, Karlynn GAILS, MD.  Allergies  Allergen Reactions   Verapamil Shortness Of Breath    REACTION: SOB   Aspirin  Itching    But tolerates low dose   Atenolol     REACTION: fatigue   Codeine Itching   Codeine Sulfate    Hydrochlorothiazide     REACTION: gout   Hydrocodone        side effects - hallucinations   Ibuprofen      Upset stomach w/high doses   Lisinopril     REACTION: cough   Onion Other (See Comments)    Dry mouth/ gets sores   Shellfish Allergy Swelling    Patient stated she does not eat shellfish, she swells up on different parts of the body   Statins     Dizzy, falls   Valsartan Itching   Adhesive  [Tape] Rash   Other Rash    Review of Systems: Negative except as noted in the HPI.  Objective: No changes noted in today's physical examination. There were no vitals filed for this visit. Breanna [name] is a pleasant 89 y.o. female in NAD. AAO x 3.   Vascular Examination: Capillary refill time immediate b/l. Faintly palpable DP pulses b/l LE. Diminished PT pulse(s) b/l LE. Pedal edema absent. No pain with calf compression b/l. Skin temperature gradient WNL b/l. No cyanosis  or clubbing b/l. No ischemia or gangrene noted b/l.   Neurological Examination: Sensation grossly intact b/l with 10 gram monofilament. Vibratory sensation intact b/l. Pt has subjective symptoms of neuropathy.  Dermatological Examination: Pedal skin with normal turgor, texture and tone b/l.  No open wounds. No interdigital macerations.   Toenails 1-5 b/l thick, discolored, elongated with subungual debris and pain on dorsal palpation.   Hyperkeratotic lesion(s) posterolateral aspect of right heel and posteromedial aspect of left heel.  No erythema, no edema, no drainage, no fluctuance.  Musculoskeletal Examination: Muscle strength 5/5 to all lower extremity muscle groups bilaterally. Pes planus deformity noted bilateral LE.SABRA No pain, crepitus or joint limitation noted with ROM b/l LE.  Patient ambulates independently without assistive aids.  Radiographs: None  Last A1c:      Latest Ref Rng & Units 06/24/2024   12:26 PM 06/04/2024    8:17 PM  Hemoglobin A1C  Hemoglobin-A1c 4.6 - 6.5 % 6.3  6.2    Assessment/Plan: 1. Pain due to onychomycosis of toenails of both feet   2. Callus of foot   3. Pes planus of both feet   4. Type II diabetes mellitus with peripheral circulatory disorder (HCC)   5. Encounter for diabetic foot exam (HCC)   Diabetic foot examination performed today. All patient's and/or POA's questions/concerns addressed on today's visit. Toenails 1-5 b/l debrided in length and girth without  incident. Callus(es) posterolateral aspect of right heel and posteromedial aspect of left heel pared with sharp debridement without incident. Continue daily foot inspections and monitor blood glucose per PCP/Endocrinologist's recommendations.Continue soft, supportive shoe gear daily. Report any pedal injuries to medical professional. Call office if there are any questions/concerns. Discussed heel protectors with daughter. They refuse due to them being a fall risk. Advised daughter to keep heels  separated when in bed and offload heels at all times when in bed. DME Rx written for [employer] [facility] [employer] for one pair extra depth shoes and 3 pair total contact insoles. To [facility], early years/pre, [address], [place], [place]. Phone: [phone]. Fax: [phone]. -Patient/POA to call should there be question/concern in the interim.   Return in about 3 months (around 01/07/2025).  [name], DPM      [facility] LOCATION: [address], [place] [address]                   Office [phone]   [facility] LOCATION: [address] [place], [place] [address] Office [phone]

## 2024-10-12 ENCOUNTER — Encounter: Payer: Self-pay | Admitting: Internal Medicine

## 2024-10-12 NOTE — Assessment & Plan Note (Signed)
 Doing well

## 2024-10-12 NOTE — Assessment & Plan Note (Signed)
 On iron  infusions.  Feeling better

## 2024-10-13 NOTE — Telephone Encounter (Signed)
 I was able to speak with the pts son and inform him that pts paper has been signed and ready for pick up.  Paperwork has been placed upfront for pick up.

## 2024-10-14 ENCOUNTER — Other Ambulatory Visit: Payer: Self-pay

## 2024-10-14 DIAGNOSIS — D46Z Other myelodysplastic syndromes: Secondary | ICD-10-CM

## 2024-10-15 ENCOUNTER — Inpatient Hospital Stay: Attending: Hematology

## 2024-10-15 ENCOUNTER — Inpatient Hospital Stay

## 2024-10-15 VITALS — BP 159/84 | HR 57 | Temp 98.8°F | Resp 17

## 2024-10-15 DIAGNOSIS — D46Z Other myelodysplastic syndromes: Secondary | ICD-10-CM | POA: Diagnosis not present

## 2024-10-15 DIAGNOSIS — D631 Anemia in chronic kidney disease: Secondary | ICD-10-CM

## 2024-10-15 LAB — CBC WITH DIFFERENTIAL (CANCER CENTER ONLY)
Abs Immature Granulocytes: 0.01 K/uL (ref 0.00–0.07)
Basophils Absolute: 0 K/uL (ref 0.0–0.1)
Basophils Relative: 1 %
Eosinophils Absolute: 0.2 K/uL (ref 0.0–0.5)
Eosinophils Relative: 4 %
HCT: 27.8 % — ABNORMAL LOW (ref 36.0–46.0)
Hemoglobin: 9.1 g/dL — ABNORMAL LOW (ref 12.0–15.0)
Immature Granulocytes: 0 %
Lymphocytes Relative: 23 %
Lymphs Abs: 1 K/uL (ref 0.7–4.0)
MCH: 27.8 pg (ref 26.0–34.0)
MCHC: 32.7 g/dL (ref 30.0–36.0)
MCV: 85 fL (ref 80.0–100.0)
Monocytes Absolute: 0.5 K/uL (ref 0.1–1.0)
Monocytes Relative: 11 %
Neutro Abs: 2.6 K/uL (ref 1.7–7.7)
Neutrophils Relative %: 61 %
Platelet Count: 142 K/uL — ABNORMAL LOW (ref 150–400)
RBC: 3.27 MIL/uL — ABNORMAL LOW (ref 3.87–5.11)
RDW: 18 % — ABNORMAL HIGH (ref 11.5–15.5)
WBC Count: 4.3 K/uL (ref 4.0–10.5)
nRBC: 0 % (ref 0.0–0.2)

## 2024-10-15 LAB — CMP (CANCER CENTER ONLY)
ALT: 7 U/L (ref 0–44)
AST: 17 U/L (ref 15–41)
Albumin: 3.6 g/dL (ref 3.5–5.0)
Alkaline Phosphatase: 87 U/L (ref 38–126)
Anion gap: 5 (ref 5–15)
BUN: 31 mg/dL — ABNORMAL HIGH (ref 8–23)
CO2: 30 mmol/L (ref 22–32)
Calcium: 9.1 mg/dL (ref 8.9–10.3)
Chloride: 108 mmol/L (ref 98–111)
Creatinine: 1.34 mg/dL — ABNORMAL HIGH (ref 0.44–1.00)
GFR, Estimated: 37 mL/min — ABNORMAL LOW (ref >60)
Glucose, Bld: 92 mg/dL (ref 70–99)
Potassium: 4.1 mmol/L (ref 3.5–5.1)
Sodium: 143 mmol/L (ref 135–145)
Total Bilirubin: 0.3 mg/dL (ref 0.0–1.2)
Total Protein: 7.2 g/dL (ref 6.5–8.1)

## 2024-10-15 MED ORDER — EPOETIN ALFA-EPBX 20000 UNIT/ML IJ SOLN
20000.0000 [IU] | Freq: Once | INTRAMUSCULAR | Status: AC
  Administered 2024-10-15: 20000 [IU] via SUBCUTANEOUS
  Filled 2024-10-15: qty 1

## 2024-10-21 NOTE — Telephone Encounter (Unsigned)
 Copied from CRM [id number]. Topic: Clinical - Order For Equipment >> Oct 21, 2024 10:03 AM Alfonso ORN wrote: Reason for CRM: [employer] called on behalf of pt currently in office prior to scheduling need medical notes from pcp for diabetic shoes signed  10/21 callback: [phone] fax:[phone]

## 2024-10-22 ENCOUNTER — Other Ambulatory Visit: Payer: Self-pay | Admitting: Internal Medicine

## 2024-10-22 ENCOUNTER — Encounter: Payer: Self-pay | Admitting: Internal Medicine

## 2024-10-22 DIAGNOSIS — Z1231 Encounter for screening mammogram for malignant neoplasm of breast: Secondary | ICD-10-CM

## 2024-10-22 NOTE — Telephone Encounter (Signed)
 I was able to print and fax over the last two office notes as requested.

## 2024-10-26 NOTE — Telephone Encounter (Signed)
 Attachment has been printed and placed on PCP desk for review.

## 2024-11-04 DIAGNOSIS — H401133 Primary open-angle glaucoma, bilateral, severe stage: Secondary | ICD-10-CM | POA: Diagnosis not present

## 2024-11-04 DIAGNOSIS — Z961 Presence of intraocular lens: Secondary | ICD-10-CM | POA: Diagnosis not present

## 2024-11-04 DIAGNOSIS — Z947 Corneal transplant status: Secondary | ICD-10-CM | POA: Diagnosis not present

## 2024-11-10 ENCOUNTER — Telehealth: Payer: Self-pay

## 2024-11-10 NOTE — Telephone Encounter (Unsigned)
 Copied from CRM [id number]. Topic: Clinical - Order For Equipment >> Nov 10, 2024 11:55 AM Rea BROCKS wrote: Reason for CRM: [employer] called back and they said that they received encounter notes for patient but they are still missing a standard written order. They said they will fax it over again.

## 2024-11-10 NOTE — Telephone Encounter (Signed)
 Copied from CRM [id number]. Topic: Clinical - Order For Equipment >> Oct 21, 2024 10:03 AM Alfonso ORN wrote: Reason for CRM: [employer] called on behalf of pt currently in office prior to scheduling need medical notes from pcp for diabetic shoes signed  10/21 callback: [phone] [phone] >> Nov 09, 2024  4:07 PM Drema MATSU wrote: Patient is calling to check the status of diabetic shoes. She stated that the company [employer] still hasn't received confirmation stating that we approved for her to have the diabetic shoes. They need documentation stating that pcp recommends that she needs diabetic shoes.

## 2024-11-10 NOTE — Telephone Encounter (Signed)
 Documents have been faxed.

## 2024-11-13 ENCOUNTER — Other Ambulatory Visit: Payer: Self-pay

## 2024-11-13 DIAGNOSIS — D631 Anemia in chronic kidney disease: Secondary | ICD-10-CM

## 2024-11-13 DIAGNOSIS — D46Z Other myelodysplastic syndromes: Secondary | ICD-10-CM

## 2024-11-16 ENCOUNTER — Inpatient Hospital Stay: Attending: Hematology

## 2024-11-16 ENCOUNTER — Inpatient Hospital Stay

## 2024-11-16 VITALS — BP 136/66 | HR 52 | Temp 98.7°F | Resp 18

## 2024-11-16 DIAGNOSIS — D631 Anemia in chronic kidney disease: Secondary | ICD-10-CM | POA: Insufficient documentation

## 2024-11-16 DIAGNOSIS — N1831 Chronic kidney disease, stage 3a: Secondary | ICD-10-CM | POA: Diagnosis not present

## 2024-11-16 DIAGNOSIS — D46Z Other myelodysplastic syndromes: Secondary | ICD-10-CM

## 2024-11-16 LAB — CBC WITH DIFFERENTIAL (CANCER CENTER ONLY)
Abs Immature Granulocytes: 0.01 K/uL (ref 0.00–0.07)
Basophils Absolute: 0 K/uL (ref 0.0–0.1)
Basophils Relative: 1 %
Eosinophils Absolute: 0.2 K/uL (ref 0.0–0.5)
Eosinophils Relative: 4 %
HCT: 31.9 % — ABNORMAL LOW (ref 36.0–46.0)
Hemoglobin: 10.4 g/dL — ABNORMAL LOW (ref 12.0–15.0)
Immature Granulocytes: 0 %
Lymphocytes Relative: 23 %
Lymphs Abs: 0.9 K/uL (ref 0.7–4.0)
MCH: 27.2 pg (ref 26.0–34.0)
MCHC: 32.6 g/dL (ref 30.0–36.0)
MCV: 83.3 fL (ref 80.0–100.0)
Monocytes Absolute: 0.4 K/uL (ref 0.1–1.0)
Monocytes Relative: 10 %
Neutro Abs: 2.4 K/uL (ref 1.7–7.7)
Neutrophils Relative %: 62 %
Platelet Count: 108 K/uL — ABNORMAL LOW (ref 150–400)
RBC: 3.83 MIL/uL — ABNORMAL LOW (ref 3.87–5.11)
RDW: 17.7 % — ABNORMAL HIGH (ref 11.5–15.5)
WBC Count: 3.8 K/uL — ABNORMAL LOW (ref 4.0–10.5)
nRBC: 0 % (ref 0.0–0.2)

## 2024-11-16 LAB — CMP (CANCER CENTER ONLY)
ALT: 10 U/L (ref 0–44)
AST: 24 U/L (ref 15–41)
Albumin: 3.9 g/dL (ref 3.5–5.0)
Alkaline Phosphatase: 108 U/L (ref 38–126)
Anion gap: 10 (ref 5–15)
BUN: 41 mg/dL — ABNORMAL HIGH (ref 8–23)
CO2: 28 mmol/L (ref 22–32)
Calcium: 9.4 mg/dL (ref 8.9–10.3)
Chloride: 106 mmol/L (ref 98–111)
Creatinine: 1.59 mg/dL — ABNORMAL HIGH (ref 0.44–1.00)
GFR, Estimated: 30 mL/min — ABNORMAL LOW (ref >60)
Glucose, Bld: 123 mg/dL — ABNORMAL HIGH (ref 70–99)
Potassium: 4.1 mmol/L (ref 3.5–5.1)
Sodium: 143 mmol/L (ref 135–145)
Total Bilirubin: 0.2 mg/dL (ref 0.0–1.2)
Total Protein: 7.6 g/dL (ref 6.5–8.1)

## 2024-11-16 MED ORDER — EPOETIN ALFA-EPBX 20000 UNIT/ML IJ SOLN
20000.0000 [IU] | Freq: Once | INTRAMUSCULAR | Status: AC
  Administered 2024-11-16: 20000 [IU] via SUBCUTANEOUS
  Filled 2024-11-16: qty 1

## 2024-11-27 ENCOUNTER — Telehealth: Payer: Self-pay

## 2024-11-27 NOTE — Telephone Encounter (Signed)
 Copied from CRM #[id number]. Topic: General - Other >> Nov 27, 2024  3:22 PM Deaijah H wrote: Reason for CRM: [place] w/ [employer] called due to faxing over standard written order on 11/6 but haven't heard anything back, stated she also needs signed physician orders showing history for pre callus, poor circulation, and foot deformity.  Fax : [phone]

## 2024-12-01 NOTE — Telephone Encounter (Signed)
 Okay.  Thanks.

## 2024-12-07 ENCOUNTER — Ambulatory Visit
Admission: RE | Admit: 2024-12-07 | Discharge: 2024-12-07 | Disposition: A | Discharge status: Home / Self Care | Source: Ambulatory Visit | Attending: Internal Medicine | Admitting: Internal Medicine

## 2024-12-07 DIAGNOSIS — Z1231 Encounter for screening mammogram for malignant neoplasm of breast: Secondary | ICD-10-CM

## 2024-12-18 ENCOUNTER — Other Ambulatory Visit: Payer: Self-pay | Admitting: *Deleted

## 2024-12-18 DIAGNOSIS — D46Z Other myelodysplastic syndromes: Secondary | ICD-10-CM

## 2024-12-21 ENCOUNTER — Inpatient Hospital Stay: Attending: Hematology

## 2024-12-21 ENCOUNTER — Inpatient Hospital Stay

## 2024-12-21 VITALS — BP 123/56 | HR 42 | Resp 17

## 2024-12-21 DIAGNOSIS — D649 Anemia, unspecified: Secondary | ICD-10-CM | POA: Diagnosis present

## 2024-12-21 DIAGNOSIS — D631 Anemia in chronic kidney disease: Secondary | ICD-10-CM

## 2024-12-21 DIAGNOSIS — D46Z Other myelodysplastic syndromes: Secondary | ICD-10-CM

## 2024-12-21 LAB — CMP (CANCER CENTER ONLY)
ALT: 12 U/L (ref 0–44)
AST: 26 U/L (ref 15–41)
Albumin: 4 g/dL (ref 3.5–5.0)
Alkaline Phosphatase: 117 U/L (ref 38–126)
Anion gap: 9 (ref 5–15)
BUN: 44 mg/dL — ABNORMAL HIGH (ref 8–23)
CO2: 29 mmol/L (ref 22–32)
Calcium: 9.6 mg/dL (ref 8.9–10.3)
Chloride: 105 mmol/L (ref 98–111)
Creatinine: 1.55 mg/dL — ABNORMAL HIGH (ref 0.44–1.00)
GFR, Estimated: 31 mL/min — ABNORMAL LOW
Glucose, Bld: 104 mg/dL — ABNORMAL HIGH (ref 70–99)
Potassium: 4.3 mmol/L (ref 3.5–5.1)
Sodium: 143 mmol/L (ref 135–145)
Total Bilirubin: 0.3 mg/dL (ref 0.0–1.2)
Total Protein: 7.8 g/dL (ref 6.5–8.1)

## 2024-12-21 LAB — CBC WITH DIFFERENTIAL (CANCER CENTER ONLY)
Abs Immature Granulocytes: 0.01 K/uL (ref 0.00–0.07)
Basophils Absolute: 0 K/uL (ref 0.0–0.1)
Basophils Relative: 1 %
Eosinophils Absolute: 0.1 K/uL (ref 0.0–0.5)
Eosinophils Relative: 3 %
HCT: 31.3 % — ABNORMAL LOW (ref 36.0–46.0)
Hemoglobin: 10.4 g/dL — ABNORMAL LOW (ref 12.0–15.0)
Immature Granulocytes: 0 %
Lymphocytes Relative: 19 %
Lymphs Abs: 0.9 K/uL (ref 0.7–4.0)
MCH: 26.7 pg (ref 26.0–34.0)
MCHC: 33.2 g/dL (ref 30.0–36.0)
MCV: 80.3 fL (ref 80.0–100.0)
Monocytes Absolute: 0.3 K/uL (ref 0.1–1.0)
Monocytes Relative: 7 %
Neutro Abs: 3.2 K/uL (ref 1.7–7.7)
Neutrophils Relative %: 70 %
Platelet Count: 112 K/uL — ABNORMAL LOW (ref 150–400)
RBC: 3.9 MIL/uL (ref 3.87–5.11)
RDW: 18.6 % — ABNORMAL HIGH (ref 11.5–15.5)
WBC Count: 4.6 K/uL (ref 4.0–10.5)
nRBC: 0 % (ref 0.0–0.2)

## 2024-12-21 LAB — IRON AND IRON BINDING CAPACITY (CC-WL,HP ONLY)
Iron: 49 ug/dL (ref 28–170)
Saturation Ratios: 14 % (ref 10.4–31.8)
TIBC: 354 ug/dL (ref 250–450)
UIBC: 305 ug/dL

## 2024-12-21 LAB — FERRITIN: Ferritin: 165 ng/mL (ref 11–307)

## 2024-12-21 MED ORDER — EPOETIN ALFA-EPBX 20000 UNIT/ML IJ SOLN: 20000.0000 [IU] | Freq: Once | INTRAMUSCULAR | Status: DC

## 2024-12-21 NOTE — Progress Notes (Signed)
 Hgb 10.4. Retacrit  held per order parameters. Pharmacy also in agreeance. Lab results printed, given to patient and her son. Understanding verbalized.

## 2024-12-22 LAB — KAPPA/LAMBDA LIGHT CHAINS
Kappa free light chain: 119.9 mg/L — ABNORMAL HIGH (ref 3.3–19.4)
Kappa, lambda light chain ratio: 1.15 (ref 0.26–1.65)
Lambda free light chains: 104.6 mg/L — ABNORMAL HIGH (ref 5.7–26.3)

## 2024-12-22 LAB — MULTIPLE MYELOMA PANEL, SERUM
Albumin SerPl Elph-Mcnc: 3.3 g/dL (ref 2.9–4.4)
Albumin/Glob SerPl: 0.9 (ref 0.7–1.7)
Alpha 1: 0.3 g/dL (ref 0.0–0.4)
Alpha2 Glob SerPl Elph-Mcnc: 0.8 g/dL (ref 0.4–1.0)
B-Globulin SerPl Elph-Mcnc: 1.2 g/dL (ref 0.7–1.3)
Gamma Glob SerPl Elph-Mcnc: 1.6 g/dL (ref 0.4–1.8)
Globulin, Total: 3.8 g/dL (ref 2.2–3.9)
IgA: 640 mg/dL — ABNORMAL HIGH (ref 64–422)
IgG (Immunoglobin G), Serum: 1681 mg/dL — ABNORMAL HIGH (ref 586–1602)
IgM (Immunoglobulin M), Srm: 58 mg/dL (ref 26–217)
Total Protein ELP: 7.1 g/dL (ref 6.0–8.5)

## 2024-12-28 ENCOUNTER — Encounter: Payer: Self-pay | Admitting: Internal Medicine

## 2024-12-28 ENCOUNTER — Ambulatory Visit: Admitting: Internal Medicine

## 2024-12-28 VITALS — BP 132/78 | HR 44 | Ht 61.0 in

## 2024-12-28 DIAGNOSIS — K219 Gastro-esophageal reflux disease without esophagitis: Secondary | ICD-10-CM | POA: Diagnosis not present

## 2024-12-28 DIAGNOSIS — R5383 Other fatigue: Secondary | ICD-10-CM

## 2024-12-28 DIAGNOSIS — R32 Unspecified urinary incontinence: Secondary | ICD-10-CM | POA: Diagnosis not present

## 2024-12-28 DIAGNOSIS — E1121 Type 2 diabetes mellitus with diabetic nephropathy: Secondary | ICD-10-CM

## 2024-12-28 DIAGNOSIS — I10 Essential (primary) hypertension: Secondary | ICD-10-CM

## 2024-12-28 DIAGNOSIS — E119 Type 2 diabetes mellitus without complications: Secondary | ICD-10-CM | POA: Diagnosis not present

## 2024-12-28 DIAGNOSIS — M79642 Pain in left hand: Secondary | ICD-10-CM | POA: Diagnosis not present

## 2024-12-28 DIAGNOSIS — M79643 Pain in unspecified hand: Secondary | ICD-10-CM | POA: Insufficient documentation

## 2024-12-28 NOTE — Progress Notes (Signed)
 "  Subjective:  Patient ID: Breanna [name], female    DOB: 08-25-1931  Age: 89 y.o. MRN: [id number]  CC: Medical Management of Chronic Issues (3 Month follow up. New incontinence. Unable to get iron  injection due to iron  levels increasing. Chronic fatigue. )   HPI [facility] presents for blindness, C/o fatigue, incontinence - worse, GERD, HTN...   Outpatient Medications Prior to Visit  Medication Sig Dispense Refill   acetaminophen  (TYLENOL ) 325 MG tablet Take 2 tablets (650 mg total) by mouth every 4 (four) hours.     allopurinol  (ZYLOPRIM ) 100 MG tablet Take 0.5 tablets (50 mg total) by mouth every other day.     amLODipine  (NORVASC ) 5 MG tablet TAKE 1 TABLET (5 MG TOTAL) BY MOUTH DAILY. 90 tablet 3   artificial tears ophthalmic solution Place 1 drop into both eyes as needed for dry eyes.     atropine  1 % ophthalmic solution Place 1 drop into both eyes daily.     azithromycin  (ZITHROMAX  Z-PAK) 250 MG tablet As directed 6 tablet 0   calcium  carbonate (OSCAL) 1500 (600 Ca) MG TABS tablet Take 1,500 mg by mouth daily.     carvedilol  (COREG ) 12.5 MG tablet Take 1 tablet (12.5 mg total) by mouth 2 (two) times daily with a meal. 60 tablet 11   cholecalciferol  (CHOLECALCIFEROL ) 25 MCG tablet Take 1 tablet (1,000 Units total) by mouth daily.     ciprofloxacin  (CIPRO ) 250 MG tablet Take 1 tablet (250 mg total) by mouth 2 (two) times daily. 14 tablet 0   [facility] VITAMIN B12 1000 MCG tablet TAKE 1 TABLET (1,000 MCG TOTAL) BY MOUTH EVERY DAY 90 tablet 3   docusate sodium  (COLACE) 100 MG capsule TAKE 1 CAPSULE BY MOUTH TWICE A DAY 180 capsule 3   dorzolamide -timolol  (COSOPT ) 22.3-6.8 MG/ML ophthalmic solution Place 1 drop into both eyes 2 (two) times daily.      epoetin  alfa (EPOGEN ) 2000 UNIT/ML injection      feeding supplement (ENSURE PLUS HIGH PROTEIN) LIQD Take 237 mLs by mouth 2 (two) times daily between meals.     ferrous sulfate  325 (65 FE) MG tablet Take 1 tablet (325 mg total) by mouth  daily with breakfast.     gabapentin  (NEURONTIN ) 100 MG capsule TAKE 1 CAPSULE BY MOUTH TWICE A DAY 180 capsule 5   glucose blood (GNP TRUE METRIX GLUCOSE STRIPS) test strip Use to check blood sugars twice a day 200 each 2   latanoprost  (XALATAN ) 0.005 % ophthalmic solution Place 1 drop into both eyes at bedtime.      losartan  (COZAAR ) 50 MG tablet TAKE 1 TABLET BY MOUTH TWICE A DAY 180 tablet 1   metFORMIN  (GLUCOPHAGE ) 500 MG tablet Take 500 mg by mouth daily with breakfast.     pantoprazole  (PROTONIX ) 40 MG tablet TAKE 1 TABLET BY MOUTH EVERY DAY 90 tablet 3   polyethylene glycol (MIRALAX  / GLYCOLAX ) 17 g packet Take 17 g by mouth daily as needed for mild constipation.     rosuvastatin  (CRESTOR ) 5 MG tablet Take 5 mg by mouth daily.     senna (SENOKOT) 8.6 MG TABS tablet Take 1 tablet (8.6 mg total) by mouth at bedtime. While taking opioids     sertraline  (ZOLOFT ) 100 MG tablet TAKE 1 TABLET BY MOUTH EVERY DAY 90 tablet 1   spironolactone  (ALDACTONE ) 25 MG tablet Take 25 mg by mouth 3 (three) times a week.     torsemide  (DEMADEX ) 20 MG tablet TAKE  1 TABLET (20 MG TOTAL) BY MOUTH DAILY AS NEEDED. TAKE IF NEEDED ONCE PER DAY FOR LOWER EXTREMITY SWELLING. IF YOU ARE HAVING TO TAKE MORE THAN TWICE PER WEEK, PLEASE CALL AND LET US  KNOW 90 tablet 2   amLODipine  (NORVASC ) 10 MG tablet Take 1 tablet (10 mg total) by mouth daily. (Patient not taking: Reported on 12/28/2024) 90 tablet 3   ondansetron  (ZOFRAN -ODT) 4 MG disintegrating tablet Take 1 tablet (4 mg total) by mouth every 8 (eight) hours as needed for nausea or vomiting. (Patient not taking: Reported on 12/28/2024)     No facility-administered medications prior to visit.    ROS: Review of Systems  Constitutional:  Negative for activity change, appetite change, chills, fatigue and unexpected weight change.  HENT:  Negative for congestion, mouth sores and sinus pressure.   Eyes:  Positive for visual disturbance.  Respiratory:  Negative for cough  and chest tightness.   Gastrointestinal:  Negative for abdominal pain and nausea.  Genitourinary:  Positive for frequency and urgency. Negative for difficulty urinating, dysuria and vaginal pain.  Musculoskeletal:  Positive for arthralgias and gait problem. Negative for back pain.  Skin:  Negative for pallor and rash.  Neurological:  Negative for dizziness, tremors, weakness, numbness and headaches.  Psychiatric/Behavioral:  Negative for confusion and sleep disturbance.     Objective:  BP 132/78   Pulse (!) 44   Ht 5' 1 (1.549 m)   SpO2 99%   BMI 22.82 kg/m   BP Readings from Last 3 Encounters:  12/28/24 132/78  12/21/24 (!) 123/56  11/16/24 136/66    Wt Readings from Last 3 Encounters:  10/01/24 120 lb 12.8 oz (54.8 kg)  09/24/24 111 lb 12.8 oz (50.7 kg)  08/25/24 109 lb (49.4 kg)    Physical Exam Constitutional:      General: She is not in acute distress.    Appearance: Normal appearance. She is well-developed.  HENT:     Head: Normocephalic.     Right Ear: External ear normal.     Left Ear: External ear normal.     Nose: Nose normal.  Eyes:     General:        Right eye: No discharge.        Left eye: No discharge.     Conjunctiva/sclera: Conjunctivae normal.     Pupils: Pupils are equal, round, and reactive to light.  Neck:     Thyroid : No thyromegaly.     Vascular: No JVD.     Trachea: No tracheal deviation.  Cardiovascular:     Rate and Rhythm: Normal rate and regular rhythm.     Heart sounds: Normal heart sounds.  Pulmonary:     Effort: No respiratory distress.     Breath sounds: No stridor. No wheezing.  Abdominal:     General: Bowel sounds are normal. There is no distension.     Palpations: Abdomen is soft. There is no mass.     Tenderness: There is no abdominal tenderness. There is no guarding or rebound.  Musculoskeletal:        General: No tenderness.     Cervical back: Normal range of motion and neck supple. No rigidity.  Lymphadenopathy:      Cervical: No cervical adenopathy.  Skin:    Findings: No erythema or rash.  Neurological:     Cranial Nerves: No cranial nerve deficit.     Motor: No abnormal muscle tone.     Coordination: Coordination normal.  Deep Tendon Reflexes: Reflexes normal.  Psychiatric:        Behavior: Behavior normal.        Thought Content: Thought content normal.        Judgment: Judgment normal.     Lab Results  Component Value Date   WBC 4.6 12/21/2024   HGB 10.4 (L) 12/21/2024   HCT 31.3 (L) 12/21/2024   PLT 112 (L) 12/21/2024   GLUCOSE 104 (H) 12/21/2024   CHOL 127 03/01/2023   TRIG 42 03/01/2023   HDL 53 03/01/2023   LDLCALC 66 03/01/2023   ALT 12 12/21/2024   AST 26 12/21/2024   NA 143 12/21/2024   K 4.3 12/21/2024   CL 105 12/21/2024   CREATININE 1.55 (H) 12/21/2024   BUN 44 (H) 12/21/2024   CO2 29 12/21/2024   TSH 0.702 07/27/2024   HGBA1C 6.3 06/24/2024    MM 3D SCREENING MAMMOGRAM BILATERAL BREAST Result Date: 12/10/2024 CLINICAL DATA:  Screening. EXAM: DIGITAL SCREENING BILATERAL MAMMOGRAM WITH TOMOSYNTHESIS AND CAD TECHNIQUE: Bilateral screening digital craniocaudal and mediolateral oblique mammograms were obtained. Bilateral screening digital breast tomosynthesis was performed. The images were evaluated with computer-aided detection. COMPARISON:  Previous exam(s). ACR Breast Density Category c: The breasts are heterogeneously dense, which may obscure small masses. FINDINGS: There are no findings suspicious for malignancy. IMPRESSION: No mammographic evidence of malignancy. A result letter of this screening mammogram will be mailed directly to the patient. RECOMMENDATION: Screening mammogram in one year. (Code:SM-B-01Y) BI-RADS CATEGORY  1: Negative. Electronically Signed   By: Craig Farr M.D.   On: 12/10/2024 16:59    Assessment & Plan:   Problem List Items Addressed This Visit     DM2 (diabetes mellitus, type 2) (HCC)   Doing well      Relevant Orders    Comprehensive metabolic panel with GFR   TSH   Urinalysis   Hemoglobin A1c   HTN (hypertension)    Take Losartan  50 mg with breakfast. Take Amlodipine  10 mg with supper. Take Spironolactone  3/wk BP Readings from Last 3 Encounters:  12/28/24 132/78  12/21/24 (!) 123/56  11/16/24 136/66         GERD (gastroesophageal reflux disease)   Continue with current prescription therapy as reflected on the Med list.       Fatigue   CFS Check labs       Relevant Orders   Comprehensive metabolic panel with GFR   TSH   Urinalysis   Hemoglobin A1c   Incontinence in female - Primary   Rx to Airflow (Depends, pads), barrier cream            Hand pain   L 5th MCP pain X ray offered         No orders of the defined types were placed in this encounter.     Follow-up: Return in about 3 months (around 03/28/2025) for a follow-up visit.  Marolyn Noel, MD "

## 2024-12-28 NOTE — Assessment & Plan Note (Signed)
 Continue with current prescription therapy as reflected on the Med list.

## 2024-12-28 NOTE — Patient Instructions (Signed)
 SABRA

## 2024-12-28 NOTE — Assessment & Plan Note (Signed)
" °  Take Losartan  50 mg with breakfast. Take Amlodipine  10 mg with supper. Take Spironolactone  3/wk BP Readings from Last 3 Encounters:  12/28/24 132/78  12/21/24 (!) 123/56  11/16/24 136/66    "

## 2024-12-28 NOTE — Assessment & Plan Note (Addendum)
 Rx to [facility]flow (Depends, pads), barrier cream

## 2024-12-28 NOTE — Assessment & Plan Note (Signed)
 CFS Check labs

## 2024-12-28 NOTE — Assessment & Plan Note (Signed)
 Doing well

## 2024-12-28 NOTE — Assessment & Plan Note (Signed)
 L 5th MCP pain X ray offered

## 2024-12-31 ENCOUNTER — Encounter: Payer: Self-pay | Admitting: Internal Medicine

## 2025-01-05 ENCOUNTER — Other Ambulatory Visit: Payer: Self-pay | Admitting: Internal Medicine

## 2025-01-06 ENCOUNTER — Telehealth: Payer: Self-pay

## 2025-01-06 NOTE — Telephone Encounter (Signed)
 Copied from CRM #[id number]. Topic: General - Other >> Nov 27, 2024  3:22 PM Deaijah H wrote: Reason for CRM: [facility] called due to faxing over standard written order on 11/6 but haven't heard anything back, stated she also needs signed physician orders showing history for pre callus, poor circulation, and foot deformity.  Fax : [phone] >> Jan 06, 2025  1:38 PM Thersia BROCKS wrote: [facility] [employer] [employer] called in regarding followup on paperwork, stated they have still not received it , would like for someone to give them a callback  [phone] >> Dec 04, 2024  9:15 AM Emylou G wrote: [facility] w[facility] - called checking status of paperwork.. See attached below..  >> Nov 30, 2024  2:08 PM Mercedes MATSU wrote: [facility] called in wanting to know an update on the paper work she requested last week.

## 2025-01-10 NOTE — Telephone Encounter (Signed)
 We took care of it, I believe.  Thanks

## 2025-01-20 ENCOUNTER — Encounter: Payer: Self-pay | Admitting: Podiatry

## 2025-01-20 ENCOUNTER — Ambulatory Visit (INDEPENDENT_AMBULATORY_CARE_PROVIDER_SITE_OTHER): Admitting: Podiatry

## 2025-01-20 DIAGNOSIS — B351 Tinea unguium: Secondary | ICD-10-CM

## 2025-01-20 DIAGNOSIS — M79674 Pain in right toe(s): Secondary | ICD-10-CM

## 2025-01-20 DIAGNOSIS — L89610 Pressure ulcer of right heel, unstageable: Secondary | ICD-10-CM | POA: Diagnosis not present

## 2025-01-20 DIAGNOSIS — E1151 Type 2 diabetes mellitus with diabetic peripheral angiopathy without gangrene: Secondary | ICD-10-CM | POA: Diagnosis not present

## 2025-01-20 DIAGNOSIS — E119 Type 2 diabetes mellitus without complications: Secondary | ICD-10-CM | POA: Diagnosis not present

## 2025-01-20 DIAGNOSIS — Z0189 Encounter for other specified special examinations: Secondary | ICD-10-CM

## 2025-01-20 DIAGNOSIS — L89620 Pressure ulcer of left heel, unstageable: Secondary | ICD-10-CM | POA: Diagnosis not present

## 2025-01-20 DIAGNOSIS — M2142 Flat foot [pes planus] (acquired), left foot: Secondary | ICD-10-CM

## 2025-01-20 DIAGNOSIS — M79675 Pain in left toe(s): Secondary | ICD-10-CM | POA: Diagnosis not present

## 2025-01-20 DIAGNOSIS — M2141 Flat foot [pes planus] (acquired), right foot: Secondary | ICD-10-CM | POA: Diagnosis not present

## 2025-01-20 NOTE — Progress Notes (Signed)
 "  Subjective:  Patient ID: Breanna [name], female    DOB: 20-Aug-1931,  MRN: [id number]  Breanna [name] presents to clinic today for at risk foot care. Pt has h/o NIDDM with PAD and callus(es) bilateral heels and painful thick toenails that are difficult to trim. Painful toenails interfere with ambulation. Aggravating factors include wearing enclosed shoe gear. Pain is relieved with periodic professional debridement. Painful calluses are aggravated when weightbearing with and without shoegear. Pain is relieved with periodic professional debridement. She is accompanied by her son on today's visit. She has still been unable to acquire her diabetic shoes. Chief Complaint  Patient presents with   Diabetes    [facility] Diet control diabetes. A1C 6.3. Toenail trim. LOV with PCP 12/28/24.   PCP is Plotnikov, Karlynn GAILS, MD.  Allergies[1]  Review of Systems: Negative except as noted in the HPI.  Objective: No changes noted in today's physical examination. There were no vitals filed for this visit. Breanna [name] is a pleasant 89 y.o. female thin build in NAD. AAO x 3.   Diabetic foot exam was performed with the following findings:   Vascular Examination: CFT <3 seconds b/l. DP pulses faintly palpable b/l. PT pulses nonpalpable b/l. Digital hair absent. Skin temperature gradient warm to warm b/l. No pain with calf compression. No ischemia or gangrene. No cyanosis or clubbing noted b/l. No edema noted b/l LE.   Neurological Examination: Sensation grossly intact b/l with 10 gram monofilament. Vibratory sensation intact b/l. Pt has subjective symptoms of neuropathy.  Dermatological Examination: Pedal skin warm and supple b/l. No open wounds b/l. No interdigital macerations. Toenails 1-5 b/l thick, discolored, elongated with subungual debris and pain on dorsal palpation.    Unstageable pressure injuries posterolateral aspect  of right heel and posteromedial aspect of left heel.  Musculoskeletal  Examination: Muscle strength 5/5 to all lower extremity muscle groups bilaterally. Pes planus deformity noted bilateral LE.      Assessment/Plan: 1. Pain due to onychomycosis of toenails of both feet   2. Pes planus of both feet   3. Unstageable pressure ulcer of left heel (HCC)   4. Unstageable pressure ulcer of right heel (HCC)   5. Type II diabetes mellitus with peripheral circulatory disorder (HCC)   6. Encounter for diabetic foot exam (HCC)   Diabetic foot examination performed today.  All patient's and/or POA's questions/concerns addressed on today's visit. Mycotic toenails 1-5 b/l debrided in length and girth without incident. Continue daily foot inspections and monitor blood glucose per PCP/Endocrinologist's recommendations. Continue soft, supportive shoe gear daily. Report any pedal injuries to medical professional. Call office if there are any questions/concerns. -Regarding diabetic shoes, advised son we would need to start process again since this is a new year. He related understanding. We will schedule at next visit. -Applied foam dressing to right heel. Advised son to purchase blister dressings which would provide cushion to heels. He related understanding. -Family member/caregiver/POA instructed to continue pressure precautions floating heels when patient is in bed. -Patient/POA to call should there be question/concern in the interim.   Return in about 3 months (around 04/20/2025).  [name], DPM      [place] LOCATION: [address]  [place], [place] [address]                   Office [phone]   [name] LOCATION: [address] [place], [place] [address] Office [phone]      [1]  Allergies Allergen Reactions   Verapamil Shortness Of Breath    REACTION: SOB   Aspirin  Itching    But tolerates low dose   Atenolol     REACTION: fatigue   Codeine Itching   Codeine Sulfate     Hydrochlorothiazide     REACTION: gout   Hydrocodone        side effects - hallucinations   Ibuprofen      Upset stomach w/high doses   Lisinopril     REACTION: cough   Onion Other (See Comments)    Dry mouth/ gets sores   Shellfish Allergy Swelling    Patient stated she does not eat shellfish, she swells up on different parts of the body   Statins     Dizzy, falls   Valsartan Itching   Adhesive  [Tape] Rash   Other Rash   "

## 2025-01-21 ENCOUNTER — Inpatient Hospital Stay

## 2025-01-21 ENCOUNTER — Other Ambulatory Visit: Payer: Self-pay

## 2025-01-21 DIAGNOSIS — D46Z Other myelodysplastic syndromes: Secondary | ICD-10-CM

## 2025-01-21 DIAGNOSIS — D631 Anemia in chronic kidney disease: Secondary | ICD-10-CM

## 2025-01-21 DIAGNOSIS — D500 Iron deficiency anemia secondary to blood loss (chronic): Secondary | ICD-10-CM

## 2025-01-22 ENCOUNTER — Inpatient Hospital Stay: Attending: Hematology

## 2025-01-22 ENCOUNTER — Inpatient Hospital Stay: Admitting: Hematology

## 2025-01-22 ENCOUNTER — Inpatient Hospital Stay

## 2025-01-22 VITALS — BP 127/72 | HR 40 | Temp 98.0°F | Resp 17 | Ht 61.0 in | Wt 118.0 lb

## 2025-01-22 DIAGNOSIS — D46Z Other myelodysplastic syndromes: Secondary | ICD-10-CM

## 2025-01-22 DIAGNOSIS — D631 Anemia in chronic kidney disease: Secondary | ICD-10-CM | POA: Diagnosis not present

## 2025-01-22 DIAGNOSIS — I129 Hypertensive chronic kidney disease with stage 1 through stage 4 chronic kidney disease, or unspecified chronic kidney disease: Secondary | ICD-10-CM | POA: Diagnosis present

## 2025-01-22 DIAGNOSIS — N1831 Chronic kidney disease, stage 3a: Secondary | ICD-10-CM | POA: Diagnosis present

## 2025-01-22 DIAGNOSIS — E1122 Type 2 diabetes mellitus with diabetic chronic kidney disease: Secondary | ICD-10-CM | POA: Diagnosis present

## 2025-01-22 DIAGNOSIS — N184 Chronic kidney disease, stage 4 (severe): Secondary | ICD-10-CM | POA: Diagnosis not present

## 2025-01-22 DIAGNOSIS — D500 Iron deficiency anemia secondary to blood loss (chronic): Secondary | ICD-10-CM

## 2025-01-22 LAB — IRON AND IRON BINDING CAPACITY (CC-WL,HP ONLY)
Iron: 62 ug/dL (ref 28–170)
Saturation Ratios: 18 % (ref 10.4–31.8)
TIBC: 346 ug/dL (ref 250–450)
UIBC: 284 ug/dL

## 2025-01-22 LAB — CBC WITH DIFFERENTIAL (CANCER CENTER ONLY)
Abs Immature Granulocytes: 0.01 10*3/uL (ref 0.00–0.07)
Basophils Absolute: 0 10*3/uL (ref 0.0–0.1)
Basophils Relative: 0 %
Eosinophils Absolute: 0.1 10*3/uL (ref 0.0–0.5)
Eosinophils Relative: 3 %
HCT: 31.5 % — ABNORMAL LOW (ref 36.0–46.0)
Hemoglobin: 10.3 g/dL — ABNORMAL LOW (ref 12.0–15.0)
Immature Granulocytes: 0 %
Lymphocytes Relative: 23 %
Lymphs Abs: 0.8 10*3/uL (ref 0.7–4.0)
MCH: 26.2 pg (ref 26.0–34.0)
MCHC: 32.7 g/dL (ref 30.0–36.0)
MCV: 80.2 fL (ref 80.0–100.0)
Monocytes Absolute: 0.3 10*3/uL (ref 0.1–1.0)
Monocytes Relative: 8 %
Neutro Abs: 2.2 10*3/uL (ref 1.7–7.7)
Neutrophils Relative %: 66 %
Platelet Count: 98 10*3/uL — ABNORMAL LOW (ref 150–400)
RBC: 3.93 MIL/uL (ref 3.87–5.11)
RDW: 21.2 % — ABNORMAL HIGH (ref 11.5–15.5)
WBC Count: 3.4 10*3/uL — ABNORMAL LOW (ref 4.0–10.5)
nRBC: 0.6 % — ABNORMAL HIGH (ref 0.0–0.2)

## 2025-01-22 LAB — CMP (CANCER CENTER ONLY)
ALT: 10 U/L (ref 0–44)
AST: 21 U/L (ref 15–41)
Albumin: 4 g/dL (ref 3.5–5.0)
Alkaline Phosphatase: 117 U/L (ref 38–126)
Anion gap: 12 (ref 5–15)
BUN: 37 mg/dL — ABNORMAL HIGH (ref 8–23)
CO2: 21 mmol/L — ABNORMAL LOW (ref 22–32)
Calcium: 9.4 mg/dL (ref 8.9–10.3)
Chloride: 107 mmol/L (ref 98–111)
Creatinine: 1.57 mg/dL — ABNORMAL HIGH (ref 0.44–1.00)
GFR, Estimated: 31 mL/min — ABNORMAL LOW
Glucose, Bld: 131 mg/dL — ABNORMAL HIGH (ref 70–99)
Potassium: 5.3 mmol/L — ABNORMAL HIGH (ref 3.5–5.1)
Sodium: 140 mmol/L (ref 135–145)
Total Bilirubin: 0.4 mg/dL (ref 0.0–1.2)
Total Protein: 7.8 g/dL (ref 6.5–8.1)

## 2025-01-22 LAB — FERRITIN: Ferritin: 148 ng/mL (ref 11–307)

## 2025-01-22 MED ORDER — EPOETIN ALFA-EPBX 20000 UNIT/ML IJ SOLN
20000.0000 [IU] | Freq: Once | INTRAMUSCULAR | Status: AC
  Administered 2025-01-22: 20000 [IU] via SUBCUTANEOUS
  Filled 2025-01-22: qty 1

## 2025-01-25 LAB — KAPPA/LAMBDA LIGHT CHAINS
Kappa free light chain: 115.3 mg/L — ABNORMAL HIGH (ref 3.3–19.4)
Kappa, lambda light chain ratio: 1.22 (ref 0.26–1.65)
Lambda free light chains: 94.8 mg/L — ABNORMAL HIGH (ref 5.7–26.3)

## 2025-01-26 LAB — MULTIPLE MYELOMA PANEL, SERUM
Albumin SerPl Elph-Mcnc: 3.4 g/dL (ref 2.9–4.4)
Albumin/Glob SerPl: 0.9 (ref 0.7–1.7)
Alpha 1: 0.2 g/dL (ref 0.0–0.4)
Alpha2 Glob SerPl Elph-Mcnc: 0.8 g/dL (ref 0.4–1.0)
B-Globulin SerPl Elph-Mcnc: 1.2 g/dL (ref 0.7–1.3)
Gamma Glob SerPl Elph-Mcnc: 1.5 g/dL (ref 0.4–1.8)
Globulin, Total: 3.8 g/dL (ref 2.2–3.9)
IgA: 584 mg/dL — ABNORMAL HIGH (ref 64–422)
IgG (Immunoglobin G), Serum: 1665 mg/dL — ABNORMAL HIGH (ref 586–1602)
IgM (Immunoglobulin M), Srm: 53 mg/dL (ref 26–217)
Total Protein ELP: 7.2 g/dL (ref 6.0–8.5)

## 2025-01-28 ENCOUNTER — Encounter: Payer: Self-pay | Admitting: Hematology

## 2025-02-19 ENCOUNTER — Inpatient Hospital Stay

## 2025-02-22 ENCOUNTER — Inpatient Hospital Stay: Attending: Hematology

## 2025-02-22 ENCOUNTER — Inpatient Hospital Stay

## 2025-03-19 ENCOUNTER — Inpatient Hospital Stay

## 2025-03-31 ENCOUNTER — Ambulatory Visit: Admitting: Internal Medicine

## 2025-04-23 ENCOUNTER — Inpatient Hospital Stay: Attending: Hematology

## 2025-04-28 ENCOUNTER — Ambulatory Visit: Admitting: Podiatry

## 2025-05-21 ENCOUNTER — Inpatient Hospital Stay

## 2025-05-21 ENCOUNTER — Inpatient Hospital Stay: Admitting: Hematology

## 2025-06-18 ENCOUNTER — Inpatient Hospital Stay: Attending: Hematology

## 2025-07-23 ENCOUNTER — Inpatient Hospital Stay: Attending: Hematology
